# Patient Record
Sex: Male | Born: 1945
Health system: Southern US, Community
[De-identification: ages and names within clinical notes are randomized; demographics above are authoritative.]

## PROBLEM LIST (undated history)

## (undated) DIAGNOSIS — G629 Polyneuropathy, unspecified: Secondary | ICD-10-CM

## (undated) DIAGNOSIS — M549 Dorsalgia, unspecified: Secondary | ICD-10-CM

## (undated) DIAGNOSIS — N189 Chronic kidney disease, unspecified: Secondary | ICD-10-CM

## (undated) DIAGNOSIS — J45909 Unspecified asthma, uncomplicated: Secondary | ICD-10-CM

## (undated) DIAGNOSIS — R42 Dizziness and giddiness: Secondary | ICD-10-CM

## (undated) DIAGNOSIS — D510 Vitamin B12 deficiency anemia due to intrinsic factor deficiency: Secondary | ICD-10-CM

## (undated) DIAGNOSIS — I5030 Unspecified diastolic (congestive) heart failure: Secondary | ICD-10-CM

## (undated) DIAGNOSIS — E785 Hyperlipidemia, unspecified: Secondary | ICD-10-CM

## (undated) DIAGNOSIS — E119 Type 2 diabetes mellitus without complications: Secondary | ICD-10-CM

## (undated) DIAGNOSIS — G4730 Sleep apnea, unspecified: Secondary | ICD-10-CM

## (undated) DIAGNOSIS — F419 Anxiety disorder, unspecified: Secondary | ICD-10-CM

## (undated) DIAGNOSIS — K219 Gastro-esophageal reflux disease without esophagitis: Secondary | ICD-10-CM

## (undated) DIAGNOSIS — G510 Bell's palsy: Secondary | ICD-10-CM

## (undated) DIAGNOSIS — G4700 Insomnia, unspecified: Secondary | ICD-10-CM

## (undated) DIAGNOSIS — I2510 Atherosclerotic heart disease of native coronary artery without angina pectoris: Secondary | ICD-10-CM

## (undated) DIAGNOSIS — N183 Chronic kidney disease, stage 3 unspecified: Secondary | ICD-10-CM

## (undated) DIAGNOSIS — G8929 Other chronic pain: Secondary | ICD-10-CM

## (undated) DIAGNOSIS — F329 Major depressive disorder, single episode, unspecified: Secondary | ICD-10-CM

## (undated) DIAGNOSIS — Z87442 Personal history of urinary calculi: Secondary | ICD-10-CM

## (undated) DIAGNOSIS — I2699 Other pulmonary embolism without acute cor pulmonale: Secondary | ICD-10-CM

## (undated) DIAGNOSIS — I10 Essential (primary) hypertension: Secondary | ICD-10-CM

## (undated) DIAGNOSIS — I80299 Phlebitis and thrombophlebitis of other deep vessels of unspecified lower extremity: Secondary | ICD-10-CM

## (undated) DIAGNOSIS — M5137 Other intervertebral disc degeneration, lumbosacral region: Secondary | ICD-10-CM

## (undated) DIAGNOSIS — D509 Iron deficiency anemia, unspecified: Secondary | ICD-10-CM

## (undated) DIAGNOSIS — R0600 Dyspnea, unspecified: Secondary | ICD-10-CM

## (undated) DIAGNOSIS — M109 Gout, unspecified: Secondary | ICD-10-CM

## (undated) DIAGNOSIS — N289 Disorder of kidney and ureter, unspecified: Secondary | ICD-10-CM

## (undated) DIAGNOSIS — IMO0001 Reserved for inherently not codable concepts without codable children: Secondary | ICD-10-CM

## (undated) DIAGNOSIS — R011 Cardiac murmur, unspecified: Secondary | ICD-10-CM

## (undated) DIAGNOSIS — G609 Hereditary and idiopathic neuropathy, unspecified: Secondary | ICD-10-CM

## (undated) DIAGNOSIS — Z86010 Personal history of colonic polyps: Secondary | ICD-10-CM

## (undated) DIAGNOSIS — R609 Edema, unspecified: Secondary | ICD-10-CM

## (undated) HISTORY — DX: Other pulmonary embolism without acute cor pulmonale: I26.99

## (undated) HISTORY — DX: Hyperlipidemia, unspecified: E78.5

## (undated) HISTORY — DX: Vitamin B12 deficiency anemia due to intrinsic factor deficiency: D51.0

## (undated) HISTORY — DX: Major depressive disorder, single episode, unspecified: F32.9

## (undated) HISTORY — DX: Atherosclerotic heart disease of native coronary artery without angina pectoris: I25.10

## (undated) HISTORY — DX: Gastro-esophageal reflux disease without esophagitis: K21.9

## (undated) HISTORY — DX: Essential (primary) hypertension: I10

## (undated) HISTORY — DX: Hereditary and idiopathic neuropathy, unspecified: G60.9

## (undated) HISTORY — DX: Sleep apnea, unspecified: G47.30

## (undated) HISTORY — PX: LITHOTRIPSY: SUR834

## (undated) HISTORY — DX: Unspecified diastolic (congestive) heart failure: I50.30

## (undated) HISTORY — DX: Chronic kidney disease, unspecified: N18.9

## (undated) HISTORY — DX: Personal history of colonic polyps: Z86.010

## (undated) HISTORY — DX: Iron deficiency anemia, unspecified: D50.9

## (undated) HISTORY — DX: Chronic kidney disease, stage 3 unspecified: N18.30

## (undated) HISTORY — DX: Unspecified asthma, uncomplicated: J45.909

## (undated) HISTORY — DX: Type 2 diabetes mellitus without complications: E11.9

## (undated) HISTORY — DX: Other intervertebral disc degeneration, lumbosacral region: M51.37

## (undated) HISTORY — DX: Insomnia, unspecified: G47.00

## (undated) HISTORY — PX: TONSILLECTOMY: SUR1361

## (undated) HISTORY — PX: SHOULDER ARTHROSCOPY W/ ROTATOR CUFF REPAIR: SHX2400

## (undated) HISTORY — PX: BACK SURGERY: SHX140

## (undated) HISTORY — PX: CARDIAC CATHETERIZATION: SHX172

## (undated) HISTORY — DX: Phlebitis and thrombophlebitis of other deep vessels of unspecified lower extremity: I80.299

---
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
## 1998-06-26 ENCOUNTER — Encounter: Payer: Self-pay | Admitting: Pulmonary Disease

## 1998-11-26 ENCOUNTER — Ambulatory Visit (HOSPITAL_COMMUNITY): Admission: RE | Admit: 1998-11-26 | Discharge: 1998-11-26 | Payer: Self-pay | Admitting: Infectious Diseases

## 1999-05-12 ENCOUNTER — Encounter: Payer: Self-pay | Admitting: Endocrinology

## 1999-05-12 ENCOUNTER — Encounter: Admission: RE | Admit: 1999-05-12 | Discharge: 1999-05-12 | Payer: Self-pay | Admitting: Endocrinology

## 1999-05-17 ENCOUNTER — Encounter: Payer: Self-pay | Admitting: Endocrinology

## 1999-05-17 ENCOUNTER — Ambulatory Visit (HOSPITAL_COMMUNITY): Admission: RE | Admit: 1999-05-17 | Discharge: 1999-05-17 | Payer: Self-pay | Admitting: Endocrinology

## 1999-08-14 ENCOUNTER — Ambulatory Visit (HOSPITAL_COMMUNITY): Admission: RE | Admit: 1999-08-14 | Discharge: 1999-08-14 | Payer: Self-pay | Admitting: Gastroenterology

## 1999-11-02 ENCOUNTER — Encounter: Admission: RE | Admit: 1999-11-02 | Discharge: 1999-11-02 | Payer: Self-pay | Admitting: Neurology

## 1999-11-02 ENCOUNTER — Encounter: Payer: Self-pay | Admitting: Neurology

## 2000-02-13 ENCOUNTER — Encounter: Payer: Self-pay | Admitting: Orthopedic Surgery

## 2000-02-13 ENCOUNTER — Encounter: Admission: RE | Admit: 2000-02-13 | Discharge: 2000-02-13 | Payer: Self-pay | Admitting: Orthopedic Surgery

## 2000-08-28 ENCOUNTER — Encounter: Payer: Self-pay | Admitting: Orthopedic Surgery

## 2000-08-28 ENCOUNTER — Encounter: Admission: RE | Admit: 2000-08-28 | Discharge: 2000-08-28 | Payer: Self-pay | Admitting: Orthopedic Surgery

## 2002-01-29 ENCOUNTER — Ambulatory Visit (HOSPITAL_COMMUNITY): Admission: RE | Admit: 2002-01-29 | Discharge: 2002-01-29 | Payer: Self-pay | Admitting: Endocrinology

## 2002-01-29 ENCOUNTER — Encounter: Payer: Self-pay | Admitting: Endocrinology

## 2002-02-03 ENCOUNTER — Encounter: Admission: RE | Admit: 2002-02-03 | Discharge: 2002-02-03 | Payer: Self-pay | Admitting: Endocrinology

## 2002-02-03 ENCOUNTER — Encounter: Payer: Self-pay | Admitting: Endocrinology

## 2002-05-13 ENCOUNTER — Encounter: Payer: Self-pay | Admitting: Endocrinology

## 2002-05-13 ENCOUNTER — Ambulatory Visit (HOSPITAL_COMMUNITY): Admission: RE | Admit: 2002-05-13 | Discharge: 2002-05-13 | Payer: Self-pay | Admitting: Endocrinology

## 2002-10-15 HISTORY — PX: COLONOSCOPY: SHX174

## 2003-11-25 ENCOUNTER — Ambulatory Visit: Payer: Self-pay | Admitting: Endocrinology

## 2003-12-01 ENCOUNTER — Ambulatory Visit: Payer: Self-pay | Admitting: Endocrinology

## 2003-12-12 ENCOUNTER — Ambulatory Visit: Payer: Self-pay

## 2003-12-14 ENCOUNTER — Ambulatory Visit: Payer: Self-pay | Admitting: Internal Medicine

## 2004-01-12 ENCOUNTER — Ambulatory Visit: Payer: Self-pay | Admitting: Endocrinology

## 2004-01-15 LAB — HM COLONOSCOPY

## 2004-02-16 ENCOUNTER — Ambulatory Visit: Payer: Self-pay | Admitting: Internal Medicine

## 2004-02-20 ENCOUNTER — Ambulatory Visit: Payer: Self-pay | Admitting: Internal Medicine

## 2004-02-20 ENCOUNTER — Ambulatory Visit (HOSPITAL_COMMUNITY): Admission: RE | Admit: 2004-02-20 | Discharge: 2004-02-20 | Payer: Self-pay | Admitting: Internal Medicine

## 2004-03-08 ENCOUNTER — Ambulatory Visit: Payer: Self-pay | Admitting: Internal Medicine

## 2004-04-16 ENCOUNTER — Ambulatory Visit: Payer: Self-pay | Admitting: Pulmonary Disease

## 2004-05-14 ENCOUNTER — Ambulatory Visit: Payer: Self-pay | Admitting: Pulmonary Disease

## 2004-06-15 ENCOUNTER — Ambulatory Visit: Payer: Self-pay | Admitting: Pulmonary Disease

## 2004-07-31 ENCOUNTER — Ambulatory Visit: Payer: Self-pay | Admitting: Endocrinology

## 2004-08-03 ENCOUNTER — Ambulatory Visit: Payer: Self-pay | Admitting: Endocrinology

## 2004-08-03 ENCOUNTER — Ambulatory Visit: Payer: Self-pay | Admitting: Pulmonary Disease

## 2004-10-16 ENCOUNTER — Ambulatory Visit: Payer: Self-pay | Admitting: Endocrinology

## 2004-11-07 ENCOUNTER — Ambulatory Visit: Payer: Self-pay | Admitting: Endocrinology

## 2004-11-14 ENCOUNTER — Ambulatory Visit: Payer: Self-pay | Admitting: Endocrinology

## 2005-01-14 HISTORY — PX: COLONOSCOPY: SHX174

## 2005-02-26 ENCOUNTER — Encounter: Admission: RE | Admit: 2005-02-26 | Discharge: 2005-02-26 | Payer: Self-pay | Admitting: Orthopedic Surgery

## 2005-03-15 ENCOUNTER — Ambulatory Visit: Payer: Self-pay | Admitting: Endocrinology

## 2005-03-19 ENCOUNTER — Encounter: Admission: RE | Admit: 2005-03-19 | Discharge: 2005-03-19 | Payer: Self-pay | Admitting: Endocrinology

## 2005-03-21 ENCOUNTER — Ambulatory Visit: Payer: Self-pay | Admitting: Endocrinology

## 2005-03-29 ENCOUNTER — Ambulatory Visit (HOSPITAL_COMMUNITY): Admission: RE | Admit: 2005-03-29 | Discharge: 2005-03-30 | Payer: Self-pay | Admitting: Orthopedic Surgery

## 2005-04-26 ENCOUNTER — Encounter: Payer: Self-pay | Admitting: Orthopedic Surgery

## 2005-05-14 ENCOUNTER — Encounter: Payer: Self-pay | Admitting: Orthopedic Surgery

## 2005-06-28 ENCOUNTER — Ambulatory Visit: Payer: Self-pay | Admitting: Endocrinology

## 2005-08-10 ENCOUNTER — Emergency Department: Payer: Self-pay | Admitting: Emergency Medicine

## 2005-10-10 ENCOUNTER — Ambulatory Visit: Payer: Self-pay | Admitting: Endocrinology

## 2005-10-16 ENCOUNTER — Ambulatory Visit: Payer: Self-pay | Admitting: Endocrinology

## 2005-10-28 ENCOUNTER — Ambulatory Visit: Payer: Self-pay | Admitting: Endocrinology

## 2005-10-29 ENCOUNTER — Ambulatory Visit: Payer: Self-pay | Admitting: Cardiology

## 2006-01-08 ENCOUNTER — Ambulatory Visit: Payer: Self-pay | Admitting: Critical Care Medicine

## 2006-01-13 ENCOUNTER — Ambulatory Visit: Payer: Self-pay | Admitting: Internal Medicine

## 2006-01-22 ENCOUNTER — Ambulatory Visit: Payer: Self-pay | Admitting: Pulmonary Disease

## 2006-01-22 ENCOUNTER — Ambulatory Visit: Payer: Self-pay | Admitting: Cardiology

## 2006-02-05 ENCOUNTER — Ambulatory Visit: Payer: Self-pay | Admitting: Critical Care Medicine

## 2006-02-05 ENCOUNTER — Ambulatory Visit: Payer: Self-pay | Admitting: Endocrinology

## 2006-02-05 LAB — CONVERTED CEMR LAB: Streptococcus, Group A Screen (Direct): NEGATIVE

## 2006-02-17 ENCOUNTER — Ambulatory Visit: Payer: Self-pay | Admitting: Internal Medicine

## 2006-02-19 ENCOUNTER — Ambulatory Visit: Payer: Self-pay | Admitting: Internal Medicine

## 2006-03-05 ENCOUNTER — Ambulatory Visit: Payer: Self-pay | Admitting: Endocrinology

## 2006-03-10 ENCOUNTER — Ambulatory Visit: Payer: Self-pay | Admitting: Endocrinology

## 2006-04-12 ENCOUNTER — Ambulatory Visit: Payer: Self-pay | Admitting: Family Medicine

## 2006-06-17 ENCOUNTER — Ambulatory Visit: Payer: Self-pay | Admitting: Endocrinology

## 2006-06-17 LAB — CONVERTED CEMR LAB
BUN: 23 mg/dL (ref 6–23)
Basophils Relative: 0.4 % (ref 0.0–1.0)
CO2: 25 meq/L (ref 19–32)
Chloride: 106 meq/L (ref 96–112)
Creatinine, Ser: 1.8 mg/dL — ABNORMAL HIGH (ref 0.4–1.5)
Eosinophils Absolute: 0.1 10*3/uL (ref 0.0–0.6)
Eosinophils Relative: 1.3 % (ref 0.0–5.0)
GFR calc Af Amer: 50 mL/min
Glucose, Bld: 218 mg/dL — ABNORMAL HIGH (ref 70–99)
Hemoglobin: 12.9 g/dL — ABNORMAL LOW (ref 13.0–17.0)
Lymphocytes Relative: 17 % (ref 12.0–46.0)
Monocytes Absolute: 0.2 10*3/uL (ref 0.2–0.7)
Neutro Abs: 8.2 10*3/uL — ABNORMAL HIGH (ref 1.4–7.7)
Neutrophils Relative %: 79.4 % — ABNORMAL HIGH (ref 43.0–77.0)

## 2006-06-25 ENCOUNTER — Ambulatory Visit: Payer: Self-pay | Admitting: Endocrinology

## 2006-07-03 ENCOUNTER — Ambulatory Visit: Payer: Self-pay | Admitting: Endocrinology

## 2006-07-26 ENCOUNTER — Encounter: Payer: Self-pay | Admitting: Endocrinology

## 2006-07-26 DIAGNOSIS — D509 Iron deficiency anemia, unspecified: Secondary | ICD-10-CM

## 2006-07-26 DIAGNOSIS — Z860100 Personal history of colon polyps, unspecified: Secondary | ICD-10-CM

## 2006-07-26 DIAGNOSIS — K219 Gastro-esophageal reflux disease without esophagitis: Secondary | ICD-10-CM | POA: Insufficient documentation

## 2006-07-26 DIAGNOSIS — J45909 Unspecified asthma, uncomplicated: Secondary | ICD-10-CM

## 2006-07-26 DIAGNOSIS — E119 Type 2 diabetes mellitus without complications: Secondary | ICD-10-CM

## 2006-07-26 DIAGNOSIS — I10 Essential (primary) hypertension: Secondary | ICD-10-CM

## 2006-07-26 DIAGNOSIS — G609 Hereditary and idiopathic neuropathy, unspecified: Secondary | ICD-10-CM

## 2006-07-26 HISTORY — DX: Hereditary and idiopathic neuropathy, unspecified: G60.9

## 2006-07-26 HISTORY — DX: Iron deficiency anemia, unspecified: D50.9

## 2006-07-26 HISTORY — DX: Type 2 diabetes mellitus without complications: E11.9

## 2006-07-26 HISTORY — DX: Gastro-esophageal reflux disease without esophagitis: K21.9

## 2006-07-26 HISTORY — DX: Unspecified asthma, uncomplicated: J45.909

## 2006-07-26 HISTORY — DX: Personal history of colonic polyps: Z86.010

## 2006-07-26 HISTORY — DX: Personal history of colon polyps, unspecified: Z86.0100

## 2006-07-26 HISTORY — DX: Essential (primary) hypertension: I10

## 2006-08-05 ENCOUNTER — Ambulatory Visit: Payer: Self-pay | Admitting: Endocrinology

## 2006-08-07 ENCOUNTER — Encounter: Admission: RE | Admit: 2006-08-07 | Discharge: 2006-08-07 | Payer: Self-pay | Admitting: Endocrinology

## 2006-11-14 ENCOUNTER — Ambulatory Visit: Payer: Self-pay | Admitting: Endocrinology

## 2006-11-14 LAB — CONVERTED CEMR LAB
ALT: 27 units/L (ref 0–53)
AST: 19 units/L (ref 0–37)
BUN: 18 mg/dL (ref 6–23)
Bacteria, UA: NEGATIVE
Bilirubin Urine: NEGATIVE
Bilirubin, Direct: 0.2 mg/dL (ref 0.0–0.3)
CO2: 30 meq/L (ref 19–32)
Calcium: 9.2 mg/dL (ref 8.4–10.5)
Creatinine, Ser: 1.5 mg/dL (ref 0.4–1.5)
Creatinine,U: 209.6 mg/dL
Eosinophils Relative: 3.3 % (ref 0.0–5.0)
HCT: 36.8 % — ABNORMAL LOW (ref 39.0–52.0)
Hemoglobin: 12.6 g/dL — ABNORMAL LOW (ref 13.0–17.0)
Ketones, ur: NEGATIVE mg/dL
LDL Cholesterol: 44 mg/dL (ref 0–99)
Lymphocytes Relative: 20.7 % (ref 12.0–46.0)
MCV: 86.5 fL (ref 78.0–100.0)
Mucus, UA: NEGATIVE
Neutrophils Relative %: 69.2 % (ref 43.0–77.0)
Nitrite: NEGATIVE
PSA: 0.41 ng/mL (ref 0.10–4.00)
Platelets: 226 10*3/uL (ref 150–400)
Specific Gravity, Urine: 1.025 (ref 1.000–1.03)
Squamous Epithelial / LPF: NEGATIVE /lpf
Total Bilirubin: 0.9 mg/dL (ref 0.3–1.2)
Total CHOL/HDL Ratio: 4.4
Total Protein, Urine: NEGATIVE mg/dL
Triglycerides: 158 mg/dL — ABNORMAL HIGH (ref 0–149)
VLDL: 32 mg/dL (ref 0–40)
WBC: 7.4 10*3/uL (ref 4.5–10.5)

## 2006-11-20 ENCOUNTER — Ambulatory Visit: Payer: Self-pay | Admitting: Endocrinology

## 2006-11-20 DIAGNOSIS — D510 Vitamin B12 deficiency anemia due to intrinsic factor deficiency: Secondary | ICD-10-CM | POA: Insufficient documentation

## 2006-11-20 HISTORY — DX: Vitamin B12 deficiency anemia due to intrinsic factor deficiency: D51.0

## 2006-11-25 ENCOUNTER — Encounter: Payer: Self-pay | Admitting: Endocrinology

## 2007-02-13 ENCOUNTER — Encounter: Payer: Self-pay | Admitting: Endocrinology

## 2007-02-15 ENCOUNTER — Encounter: Admission: RE | Admit: 2007-02-15 | Discharge: 2007-02-15 | Payer: Self-pay | Admitting: Orthopedic Surgery

## 2007-03-19 ENCOUNTER — Ambulatory Visit: Payer: Self-pay | Admitting: Endocrinology

## 2007-03-25 ENCOUNTER — Ambulatory Visit: Payer: Self-pay | Admitting: Endocrinology

## 2007-08-14 ENCOUNTER — Telehealth (INDEPENDENT_AMBULATORY_CARE_PROVIDER_SITE_OTHER): Payer: Self-pay | Admitting: *Deleted

## 2007-08-21 ENCOUNTER — Ambulatory Visit: Payer: Self-pay | Admitting: Endocrinology

## 2007-08-21 DIAGNOSIS — F5104 Psychophysiologic insomnia: Secondary | ICD-10-CM | POA: Insufficient documentation

## 2007-08-21 DIAGNOSIS — M79609 Pain in unspecified limb: Secondary | ICD-10-CM | POA: Insufficient documentation

## 2007-08-21 DIAGNOSIS — G4700 Insomnia, unspecified: Secondary | ICD-10-CM

## 2007-08-21 HISTORY — DX: Insomnia, unspecified: G47.00

## 2007-08-21 LAB — CONVERTED CEMR LAB: Hgb A1c MFr Bld: 8.8 % — ABNORMAL HIGH (ref 4.6–6.0)

## 2007-08-26 ENCOUNTER — Telehealth (INDEPENDENT_AMBULATORY_CARE_PROVIDER_SITE_OTHER): Payer: Self-pay | Admitting: *Deleted

## 2007-08-26 ENCOUNTER — Encounter: Payer: Self-pay | Admitting: Endocrinology

## 2007-09-10 ENCOUNTER — Encounter: Payer: Self-pay | Admitting: Endocrinology

## 2007-09-14 ENCOUNTER — Encounter: Admission: RE | Admit: 2007-09-14 | Discharge: 2007-09-14 | Payer: Self-pay | Admitting: Neurosurgery

## 2007-09-23 ENCOUNTER — Ambulatory Visit: Payer: Self-pay | Admitting: Internal Medicine

## 2007-09-23 ENCOUNTER — Encounter: Admission: RE | Admit: 2007-09-23 | Discharge: 2007-09-23 | Payer: Self-pay | Admitting: Neurosurgery

## 2007-09-23 ENCOUNTER — Inpatient Hospital Stay (HOSPITAL_COMMUNITY): Admission: EM | Admit: 2007-09-23 | Discharge: 2007-09-25 | Payer: Self-pay | Admitting: Emergency Medicine

## 2007-09-25 ENCOUNTER — Encounter: Payer: Self-pay | Admitting: Internal Medicine

## 2007-09-29 ENCOUNTER — Ambulatory Visit: Payer: Self-pay | Admitting: Cardiology

## 2007-10-02 ENCOUNTER — Ambulatory Visit: Payer: Self-pay | Admitting: Cardiology

## 2007-10-05 ENCOUNTER — Ambulatory Visit: Payer: Self-pay | Admitting: Endocrinology

## 2007-10-05 DIAGNOSIS — G8929 Other chronic pain: Secondary | ICD-10-CM | POA: Insufficient documentation

## 2007-10-05 DIAGNOSIS — M51379 Other intervertebral disc degeneration, lumbosacral region without mention of lumbar back pain or lower extremity pain: Secondary | ICD-10-CM

## 2007-10-05 DIAGNOSIS — I2699 Other pulmonary embolism without acute cor pulmonale: Secondary | ICD-10-CM

## 2007-10-05 DIAGNOSIS — Z86711 Personal history of pulmonary embolism: Secondary | ICD-10-CM | POA: Insufficient documentation

## 2007-10-05 DIAGNOSIS — M5137 Other intervertebral disc degeneration, lumbosacral region: Secondary | ICD-10-CM | POA: Insufficient documentation

## 2007-10-05 HISTORY — DX: Other intervertebral disc degeneration, lumbosacral region without mention of lumbar back pain or lower extremity pain: M51.379

## 2007-10-05 HISTORY — DX: Other pulmonary embolism without acute cor pulmonale: I26.99

## 2007-10-05 HISTORY — DX: Other intervertebral disc degeneration, lumbosacral region: M51.37

## 2007-10-09 ENCOUNTER — Ambulatory Visit: Payer: Self-pay | Admitting: Cardiovascular Disease

## 2007-10-13 ENCOUNTER — Telehealth (INDEPENDENT_AMBULATORY_CARE_PROVIDER_SITE_OTHER): Payer: Self-pay | Admitting: *Deleted

## 2007-10-16 ENCOUNTER — Ambulatory Visit: Payer: Self-pay | Admitting: Cardiovascular Disease

## 2007-10-16 ENCOUNTER — Telehealth (INDEPENDENT_AMBULATORY_CARE_PROVIDER_SITE_OTHER): Payer: Self-pay | Admitting: *Deleted

## 2007-10-19 ENCOUNTER — Telehealth: Payer: Self-pay | Admitting: Endocrinology

## 2007-10-28 ENCOUNTER — Ambulatory Visit: Payer: Self-pay | Admitting: Endocrinology

## 2007-11-05 ENCOUNTER — Ambulatory Visit: Payer: Self-pay | Admitting: Cardiology

## 2007-11-06 ENCOUNTER — Telehealth (INDEPENDENT_AMBULATORY_CARE_PROVIDER_SITE_OTHER): Payer: Self-pay | Admitting: *Deleted

## 2007-11-26 ENCOUNTER — Ambulatory Visit: Payer: Self-pay | Admitting: Internal Medicine

## 2007-11-27 ENCOUNTER — Ambulatory Visit: Payer: Self-pay | Admitting: Endocrinology

## 2007-12-03 ENCOUNTER — Ambulatory Visit: Payer: Self-pay | Admitting: Emergency Medicine

## 2007-12-03 DIAGNOSIS — G4733 Obstructive sleep apnea (adult) (pediatric): Secondary | ICD-10-CM | POA: Insufficient documentation

## 2007-12-03 DIAGNOSIS — Z86718 Personal history of other venous thrombosis and embolism: Secondary | ICD-10-CM | POA: Insufficient documentation

## 2007-12-03 DIAGNOSIS — I80299 Phlebitis and thrombophlebitis of other deep vessels of unspecified lower extremity: Secondary | ICD-10-CM

## 2007-12-03 DIAGNOSIS — G4730 Sleep apnea, unspecified: Secondary | ICD-10-CM

## 2007-12-03 HISTORY — DX: Phlebitis and thrombophlebitis of other deep vessels of unspecified lower extremity: I80.299

## 2007-12-03 HISTORY — DX: Sleep apnea, unspecified: G47.30

## 2007-12-03 LAB — CONVERTED CEMR LAB
BUN: 20 mg/dL
CO2: 25 meq/L
Calcium: 9.2 mg/dL
Chloride: 101 meq/L
Creatinine, Ser: 1.4 mg/dL
GFR calc Af Amer: 67 mL/min
GFR calc non Af Amer: 55 mL/min
Glucose, Bld: 385 mg/dL — ABNORMAL HIGH
Potassium: 4.2 meq/L
Sodium: 139 meq/L

## 2007-12-04 ENCOUNTER — Encounter: Payer: Self-pay | Admitting: Pulmonary Disease

## 2007-12-04 ENCOUNTER — Ambulatory Visit: Payer: Self-pay

## 2007-12-07 ENCOUNTER — Ambulatory Visit: Payer: Self-pay | Admitting: Internal Medicine

## 2007-12-17 ENCOUNTER — Ambulatory Visit: Payer: Self-pay | Admitting: Pulmonary Disease

## 2007-12-22 ENCOUNTER — Ambulatory Visit: Payer: Self-pay | Admitting: Endocrinology

## 2007-12-24 ENCOUNTER — Ambulatory Visit: Payer: Self-pay | Admitting: Cardiovascular Disease

## 2008-01-21 ENCOUNTER — Ambulatory Visit: Payer: Self-pay | Admitting: Cardiology

## 2008-02-04 ENCOUNTER — Ambulatory Visit: Payer: Self-pay | Admitting: Cardiology

## 2008-02-18 ENCOUNTER — Ambulatory Visit: Payer: Self-pay

## 2008-02-22 ENCOUNTER — Ambulatory Visit: Payer: Self-pay | Admitting: Pulmonary Disease

## 2008-03-03 ENCOUNTER — Ambulatory Visit: Payer: Self-pay | Admitting: Cardiology

## 2008-03-18 ENCOUNTER — Ambulatory Visit: Payer: Self-pay | Admitting: Pulmonary Disease

## 2008-03-22 DIAGNOSIS — J069 Acute upper respiratory infection, unspecified: Secondary | ICD-10-CM

## 2008-03-22 DIAGNOSIS — J22 Unspecified acute lower respiratory infection: Secondary | ICD-10-CM | POA: Insufficient documentation

## 2008-03-29 ENCOUNTER — Ambulatory Visit: Payer: Self-pay | Admitting: Pulmonary Disease

## 2008-03-31 DIAGNOSIS — J209 Acute bronchitis, unspecified: Secondary | ICD-10-CM | POA: Insufficient documentation

## 2008-04-04 ENCOUNTER — Ambulatory Visit: Payer: Self-pay | Admitting: Cardiovascular Disease

## 2008-04-06 ENCOUNTER — Telehealth (INDEPENDENT_AMBULATORY_CARE_PROVIDER_SITE_OTHER): Payer: Self-pay | Admitting: *Deleted

## 2008-04-14 ENCOUNTER — Ambulatory Visit: Payer: Self-pay | Admitting: Cardiology

## 2008-04-21 ENCOUNTER — Ambulatory Visit: Payer: Self-pay | Admitting: Endocrinology

## 2008-05-11 ENCOUNTER — Ambulatory Visit: Payer: Self-pay

## 2008-05-11 ENCOUNTER — Encounter: Payer: Self-pay | Admitting: Pulmonary Disease

## 2008-05-13 ENCOUNTER — Ambulatory Visit: Payer: Self-pay | Admitting: Internal Medicine

## 2008-05-19 ENCOUNTER — Ambulatory Visit: Payer: Self-pay | Admitting: Pulmonary Disease

## 2008-05-25 ENCOUNTER — Encounter: Payer: Self-pay | Admitting: Endocrinology

## 2008-06-02 ENCOUNTER — Ambulatory Visit: Payer: Self-pay | Admitting: Endocrinology

## 2008-06-02 LAB — CONVERTED CEMR LAB: Hgb A1c MFr Bld: 9 % — ABNORMAL HIGH (ref 4.6–6.5)

## 2008-06-14 ENCOUNTER — Inpatient Hospital Stay (HOSPITAL_COMMUNITY): Admission: RE | Admit: 2008-06-14 | Discharge: 2008-06-17 | Payer: Self-pay | Admitting: Neurosurgery

## 2008-06-22 ENCOUNTER — Encounter: Payer: Self-pay | Admitting: Endocrinology

## 2008-06-30 ENCOUNTER — Ambulatory Visit: Payer: Self-pay | Admitting: Cardiology

## 2008-07-07 ENCOUNTER — Encounter: Admission: RE | Admit: 2008-07-07 | Discharge: 2008-07-07 | Payer: Self-pay | Admitting: Neurosurgery

## 2008-07-07 ENCOUNTER — Encounter: Payer: Self-pay | Admitting: Endocrinology

## 2008-07-27 ENCOUNTER — Ambulatory Visit: Payer: Self-pay | Admitting: Endocrinology

## 2008-07-28 ENCOUNTER — Ambulatory Visit: Payer: Self-pay | Admitting: Cardiology

## 2008-08-25 ENCOUNTER — Ambulatory Visit: Payer: Self-pay | Admitting: Cardiovascular Disease

## 2008-08-29 ENCOUNTER — Encounter: Payer: Self-pay | Admitting: *Deleted

## 2008-09-07 ENCOUNTER — Encounter: Payer: Self-pay | Admitting: Pulmonary Disease

## 2008-09-16 ENCOUNTER — Ambulatory Visit: Payer: Self-pay | Admitting: Endocrinology

## 2008-09-16 DIAGNOSIS — R809 Proteinuria, unspecified: Secondary | ICD-10-CM | POA: Insufficient documentation

## 2008-09-16 LAB — CONVERTED CEMR LAB: Microalb Creat Ratio: 48.8 mg/g — ABNORMAL HIGH (ref 0.0–30.0)

## 2008-09-22 ENCOUNTER — Ambulatory Visit: Payer: Self-pay | Admitting: Internal Medicine

## 2008-10-20 ENCOUNTER — Ambulatory Visit: Payer: Self-pay | Admitting: Cardiovascular Disease

## 2008-10-28 ENCOUNTER — Ambulatory Visit: Payer: Self-pay | Admitting: Endocrinology

## 2008-11-02 ENCOUNTER — Ambulatory Visit: Payer: Self-pay | Admitting: Pulmonary Disease

## 2008-11-02 LAB — CONVERTED CEMR LAB
BUN: 21 mg/dL (ref 6–23)
CO2: 30 meq/L (ref 19–32)
Chloride: 104 meq/L (ref 96–112)
Creatinine, Ser: 1.5 mg/dL (ref 0.4–1.5)

## 2008-11-03 ENCOUNTER — Ambulatory Visit: Payer: Self-pay | Admitting: Cardiovascular Disease

## 2008-11-03 ENCOUNTER — Ambulatory Visit: Payer: Self-pay

## 2008-11-03 ENCOUNTER — Encounter: Payer: Self-pay | Admitting: Pulmonary Disease

## 2008-12-01 ENCOUNTER — Encounter: Payer: Self-pay | Admitting: Internal Medicine

## 2008-12-13 ENCOUNTER — Ambulatory Visit: Payer: Self-pay | Admitting: Endocrinology

## 2008-12-16 ENCOUNTER — Telehealth (INDEPENDENT_AMBULATORY_CARE_PROVIDER_SITE_OTHER): Payer: Self-pay | Admitting: *Deleted

## 2008-12-22 ENCOUNTER — Ambulatory Visit: Payer: Self-pay | Admitting: Pulmonary Disease

## 2008-12-29 ENCOUNTER — Telehealth: Payer: Self-pay | Admitting: Internal Medicine

## 2009-01-05 ENCOUNTER — Encounter: Admission: RE | Admit: 2009-01-05 | Discharge: 2009-01-05 | Payer: Self-pay | Admitting: Neurosurgery

## 2009-01-16 ENCOUNTER — Ambulatory Visit: Payer: Self-pay | Admitting: Pulmonary Disease

## 2009-01-31 ENCOUNTER — Ambulatory Visit: Payer: Self-pay | Admitting: Pulmonary Disease

## 2009-02-06 ENCOUNTER — Ambulatory Visit: Payer: Self-pay | Admitting: Endocrinology

## 2009-02-06 DIAGNOSIS — L909 Atrophic disorder of skin, unspecified: Secondary | ICD-10-CM | POA: Insufficient documentation

## 2009-02-06 DIAGNOSIS — L919 Hypertrophic disorder of the skin, unspecified: Secondary | ICD-10-CM

## 2009-02-06 LAB — CONVERTED CEMR LAB
ALT: 25 units/L (ref 0–53)
AST: 17 units/L (ref 0–37)
Alkaline Phosphatase: 55 units/L (ref 39–117)
Basophils Relative: 0 % (ref 0.0–3.0)
Bilirubin Urine: NEGATIVE
Bilirubin, Direct: 0.1 mg/dL (ref 0.0–0.3)
Chloride: 103 meq/L (ref 96–112)
Creatinine, Ser: 1.5 mg/dL (ref 0.4–1.5)
Eosinophils Relative: 1.1 % (ref 0.0–5.0)
GFR calc non Af Amer: 50.55 mL/min (ref >60)
Iron: 44 ug/dL (ref 42–165)
Ketones, ur: NEGATIVE mg/dL
LDL Cholesterol: 65 mg/dL (ref 0–99)
Leukocytes, UA: NEGATIVE
MCV: 84.9 fL (ref 78.0–100.0)
Microalb Creat Ratio: 62 mg/g — ABNORMAL HIGH (ref 0.0–30.0)
Monocytes Absolute: 0.2 10*3/uL (ref 0.1–1.0)
Monocytes Relative: 1.9 % — ABNORMAL LOW (ref 3.0–12.0)
Neutrophils Relative %: 86.7 % — ABNORMAL HIGH (ref 43.0–77.0)
Nitrite: NEGATIVE
PSA: 0.64 ng/mL (ref 0.10–4.00)
Potassium: 4.5 meq/L (ref 3.5–5.1)
RBC: 4.78 M/uL (ref 4.22–5.81)
Saturation Ratios: 10.2 % — ABNORMAL LOW (ref 20.0–50.0)
Total Bilirubin: 0.8 mg/dL (ref 0.3–1.2)
Total CHOL/HDL Ratio: 4
Total Protein: 7.4 g/dL (ref 6.0–8.3)
Transferrin: 307.8 mg/dL (ref 212.0–360.0)
Triglycerides: 125 mg/dL (ref 0.0–149.0)
VLDL: 25 mg/dL (ref 0.0–40.0)
WBC: 12.4 10*3/uL — ABNORMAL HIGH (ref 4.5–10.5)

## 2009-02-23 ENCOUNTER — Telehealth (INDEPENDENT_AMBULATORY_CARE_PROVIDER_SITE_OTHER): Payer: Self-pay | Admitting: *Deleted

## 2009-02-27 ENCOUNTER — Telehealth: Payer: Self-pay | Admitting: Endocrinology

## 2009-03-10 ENCOUNTER — Telehealth: Payer: Self-pay | Admitting: Endocrinology

## 2009-03-14 ENCOUNTER — Ambulatory Visit: Payer: Self-pay | Admitting: Endocrinology

## 2009-03-14 DIAGNOSIS — F3289 Other specified depressive episodes: Secondary | ICD-10-CM

## 2009-03-14 DIAGNOSIS — Z8659 Personal history of other mental and behavioral disorders: Secondary | ICD-10-CM | POA: Insufficient documentation

## 2009-03-14 DIAGNOSIS — F329 Major depressive disorder, single episode, unspecified: Secondary | ICD-10-CM

## 2009-03-14 HISTORY — DX: Other specified depressive episodes: F32.89

## 2009-03-14 HISTORY — DX: Major depressive disorder, single episode, unspecified: F32.9

## 2009-04-13 ENCOUNTER — Ambulatory Visit: Payer: Self-pay | Admitting: Endocrinology

## 2009-04-13 DIAGNOSIS — J309 Allergic rhinitis, unspecified: Secondary | ICD-10-CM | POA: Insufficient documentation

## 2009-04-13 DIAGNOSIS — E1169 Type 2 diabetes mellitus with other specified complication: Secondary | ICD-10-CM | POA: Insufficient documentation

## 2009-04-13 DIAGNOSIS — E785 Hyperlipidemia, unspecified: Secondary | ICD-10-CM

## 2009-04-13 HISTORY — DX: Hyperlipidemia, unspecified: E78.5

## 2009-05-03 ENCOUNTER — Telehealth: Payer: Self-pay | Admitting: Pulmonary Disease

## 2009-05-19 ENCOUNTER — Telehealth: Payer: Self-pay | Admitting: Endocrinology

## 2009-05-24 ENCOUNTER — Encounter: Admission: RE | Admit: 2009-05-24 | Discharge: 2009-05-24 | Payer: Self-pay | Admitting: Neurosurgery

## 2009-05-25 ENCOUNTER — Ambulatory Visit: Payer: Self-pay | Admitting: Endocrinology

## 2009-05-25 LAB — CONVERTED CEMR LAB
Cholesterol: 102 mg/dL (ref 0–200)
HDL: 23.6 mg/dL — ABNORMAL LOW (ref >39.00)
Hgb A1c MFr Bld: 8.3 % — ABNORMAL HIGH (ref 4.6–6.5)
Total CHOL/HDL Ratio: 4
Triglycerides: 242 mg/dL — ABNORMAL HIGH (ref 0.0–149.0)

## 2009-08-02 ENCOUNTER — Ambulatory Visit: Payer: Self-pay | Admitting: Internal Medicine

## 2009-09-13 ENCOUNTER — Telehealth: Payer: Self-pay | Admitting: Endocrinology

## 2009-10-03 ENCOUNTER — Ambulatory Visit: Payer: Self-pay | Admitting: Endocrinology

## 2009-10-19 ENCOUNTER — Encounter: Payer: Self-pay | Admitting: Endocrinology

## 2009-11-27 ENCOUNTER — Telehealth: Payer: Self-pay | Admitting: Endocrinology

## 2009-12-13 ENCOUNTER — Telehealth (INDEPENDENT_AMBULATORY_CARE_PROVIDER_SITE_OTHER): Payer: Self-pay | Admitting: *Deleted

## 2010-01-23 ENCOUNTER — Ambulatory Visit
Admission: RE | Admit: 2010-01-23 | Discharge: 2010-01-23 | Payer: Self-pay | Source: Home / Self Care | Attending: Pulmonary Disease | Admitting: Pulmonary Disease

## 2010-02-01 ENCOUNTER — Ambulatory Visit: Admit: 2010-02-01 | Payer: Self-pay | Admitting: Endocrinology

## 2010-02-08 ENCOUNTER — Ambulatory Visit: Admit: 2010-02-08 | Payer: Self-pay | Admitting: Endocrinology

## 2010-02-13 NOTE — Assessment & Plan Note (Signed)
Summary: rov 2 wks ///kp   Visit Type:  Follow-up Primary Provider/Referring Provider:  ellison  CC:  Pt here for follow up. Pt c/o chest congestion, cough, wheezing, and and tightness in chest. Pt states is using CPAP every night approx 8 hours.  History of Present Illness: 63/M with  DVT/pulmonary embolism 9/09,  OSA & back surgery 6/10  by Dr Trenton Gammon. His only risk factor seems to have been that he was not very mobile and has been out of work since 6/09. There was no family history suggestive for hypercoagulable state.Off coumadin since 10/10 He has been seen earlier for a chronic cough, which was  attributed  to GERD.  3/16 >>dry cough, wheezing & dyspnea 05/18/08 >>  50 mg trazodone helping with sleep, 300 mg made him 'wired' , compliant with CPAP 11/01/08 The radiating pain in his LLE has stopped but some popliteal pain is present - neuropathic vs residual clot   December 22, 2008 --Presents for an acute office visit. saw Loanne Drilling 12-13-08 for cold symptoms, was given ceftin.  sore throat, body aches, left ear achiness, prod cough with clear/yellow mucus, increased SOB, wheezing, chills x2.5weeks. Sinus congestion not better, ears full. Ceftin did not help. Wheezing is bad today.  January 16, 2009 --Presents for an acute office visit. Complains of  wheezing, dry cough during the day, cough with brownish green mucus in the morning, and chest tightness  January 31, 2009 3:38 PM  c/o chest congestion, cough, wheezing, and and tightness in chest. States theses sxs have been going on since November and haven't gotten any better after finishing 2 rounds of abx and prednisone. LAst visit given zyrtec, dexilant, mucinex & solumedrol shot. Worse when going out in the cold or when drinking something cold.     Pt states is using CPAP every night approx 8 hours.  Denies chest pain,  orthopnea, hemoptysis, fever, n/v/d, edema, headache.   Current Medications (verified): 1)  Lasix 80 Mg  Tabs  (Furosemide) .... Take One Tab By Mouth Once Daily 2)  Humalog 100 Unit/ml Soln (Insulin Lispro (Human)) .... Three Times A Day (Qac) 150-100-150 Units 3)  Cyanocobalamin 1000 Mcg/ml Soln (Cyanocobalamin) .Marland Kitchen.. 1 Injection Every 3 Months 4)  Tricor 48 Mg  Tabs (Fenofibrate) .... Take One Tab By Mouth Once Daily 5)  Cozaar 100 Mg Tabs (Losartan Potassium) .... Take 1 By Mouth Once Daily 6)  Lovastatin 40 Mg Tabs (Lovastatin) .... Take 2 Tab By Mouth At Bedtime 7)  Viagra 100 Mg  Tabs (Sildenafil Citrate) .... Take 1 Tablet By Mouth Once A Day As Needed 8)  Hydrocodone-Acetaminophen 5-500 Mg  Tabs (Hydrocodone-Acetaminophen) .... Take 1 Tab By Mouth Every 4 Hours As Needed Pain 9)  Humulin N 100 Unit/ml Susp (Insulin Isophane Human) .... 240 Units Every Night 10)  Nasal Cpap + 15 Cm 11)  Metformin Hcl 500 Mg Xr24h-Tab (Metformin Hcl) .... 2 Bid 12)  Mobic 15 Mg Tabs (Meloxicam) .... As Needed 13)  Dilt-Xr 120 Mg Xr24h-Cap (Diltiazem Hcl) .Marland Kitchen.. 1 Qd 14)  Temazepam 30 Mg Caps (Temazepam) .Marland Kitchen.. 1 Qhs 15)  Hydromet 5-1.5 Mg/45ml Syrp (Hydrocodone-Homatropine) .Marland Kitchen.. 1-2 Tsp Every 4-6 Hr As Needed Cough 16)  Tessalon 200 Mg Caps (Benzonatate) .Marland Kitchen.. 1 By Mouth Three Times A Day  Allergies (verified): 1)  ! Codeine 2)  ! * Advicor 3)  ! * Actos  Past History:  Past Medical History: Last updated: 10/28/2007  ANEMIA-IRON DEFICIENCY (ICD-280.9)  DISC DISEASE, LUMBAR (ICD-722.52)  PULMONARY EMBOLISM (ICD-415.19) INSOMNIA (ICD-780.52) FOOT PAIN, LEFT (ICD-729.5) ROUTINE GENERAL MEDICAL EXAM@HEALTH  CARE FACL (ICD-V70.0) PERNICIOUS ANEMIA (ICD-281.0) PERIPHERAL NEUROPATHY (ICD-356.9) COLONIC POLYPS, HX OF (ICD-V12.72) ASTHMA (ICD-493.90) HYPERTENSION (ICD-401.9) GERD (ICD-530.81) DIABETES MELLITUS, TYPE II (ICD-250.00) ANEMIA-IRON DEFICIENCY (ICD-280.9)  Social History: Last updated: 11/02/2008 married children worked [employer] - disabled due to back pain  Past Pulmonary  History:  Pulmonary History: 6/00:  Polysomnogram (Obstructive Sleep Apnea) Obstructive sleep apnea was diagnosed 6/00. Polysomnogram performed at [facility], RDI 138/h, lowest destan 79% and he has been maintained on CPAP+ 15 with a nasal mask. ([facility], [place]) since.  9/09 >> duplex >>echogenic material is seen in   the left profunda femoral, femoral and popliteal veins. 02/18/08 >>residual thrombus in left prox to distal SFV 4/10 residual thrombus in Lt SFV persists  9/9 >> spiral CT >>Extensive bilateral pulmonary embolism is seen in both  right and left pulmonary arteries as well as their lobar branches. 12/07/07 CT angio shows incomplete recanalisation of pulmonary arteries  Review of Systems       The patient complains of dyspnea on exertion and prolonged cough.  The patient denies anorexia, fever, weight loss, weight gain, vision loss, decreased hearing, hoarseness, chest pain, syncope, peripheral edema, headaches, hemoptysis, abdominal pain, melena, hematochezia, severe indigestion/heartburn, hematuria, muscle weakness, suspicious skin lesions, difficulty walking, depression, unusual weight change, abnormal bleeding, and enlarged lymph nodes.    Vital Signs:  Patient profile:   65 year old male Height:      67.5 inches Weight:      281 pounds O2 Sat:      96 % on Room air Temp:     98.3 degrees F oral Pulse rate:   65 / minute BP sitting:   114 / 62  (left arm) Cuff size:   regular  Vitals Entered By: Iran Planas CMA (January 31, 2009 3:29 PM)  O2 Flow:  Room air CC: Pt here for follow up. Pt c/o chest congestion, cough, wheezing, and tightness in chest. Pt states is using CPAP every night approx 8 hours Comments Medications reviewed with patient Iran Planas CMA  January 31, 2009 3:31 PM    Physical Exam  Additional Exam:  Gen. Pleasant, well-nourished, in no distress, normal affect, obesity  ENT - no lesions, no post nasal drip,  class 2 airway Neck: No JVD, no thyromegaly, no carotid bruits Lungs:exp wheezing bilaterally. coarse BS  Cardiovascular: Rhythm regular, heart sounds  normal, no murmurs or gallops, no peripheral edema Musculoskeletal: No deformities, no cyanosis or clubbing, no calf tenderness       Impression & Recommendations:  Problem # 1:  BRONCHITIS, ACUTE (ICD-466.0)  Likely post bronchitic cough now - not relieved by narcotic cough syrup. Symbicotr 160/4.5 2 puff two times a day sample Albuterol nebs only as needed  Stay on hydrocodone cough syrup & tessalon STOP mucinex His updated medication list for this problem includes:    Hydromet 5-1.5 Mg/40ml Syrp (Hydrocodone-homatropine) .Marland Kitchen... 1-2 tsp every 4-6 hr as needed cough    Tessalon 200 Mg Caps (Benzonatate) .Marland Kitchen... 1 by mouth three times a day    Albuterol Sulfate (2.5 Mg/106ml) 0.083% Nebu (Albuterol sulfate) .Marland Kitchen..Marland Kitchen Two times a day as needed  Orders: Est. Patient Level III [id number])  Problem # 2:  GERD (ICD-530.81)  His updated medication list for this problem includes:    [facility] Omeprazole 20 Mg Tbec (Omeprazole) ..... Once daily  Problem # 3:  OBSTRUCTIVE SLEEP APNEA (ICD-780.57)  CPAP compliant  Orders: Est. Patient Level III [id number])  Problem # 4:  PULMONARY EMBOLISM (ICD-415.19)  Off coumadin since 10/10  Orders: Est. Patient Level III [id number])  Medications Added to Medication List This Visit: 1)  Lovastatin 40 Mg Tabs (Lovastatin) .... Take 2 tab by mouth at bedtime 2)  [facility] Omeprazole 20 Mg Tbec (Omeprazole) .... Once daily 3)  Albuterol Sulfate (2.5 Mg/62ml) 0.083% Nebu (Albuterol sulfate) .... Two times a day as needed 4)  Nebuliser Mouthpiece  5)  Nebulizer Mouth Piece  .... Neb mouth piece, tubing, ect  Other Orders: DME Referral (DME)  Patient Instructions: 1)  Copy sent to: 2)  Please schedule a follow-up appointment in 3 months. 3)  Protonix once daily  4)  Symbicotr 160/4.5 2 puff two times a day sample 5)   Albuterol nebs only as needed  6)  Stay on hydrocodone cough syrup & tessalon 7)  STOP mucinex  Prescriptions: NEBULIZER MOUTH PIECE neb mouth piece, tubing, ect  #1 x 0   Entered by:   Tilden Dome   Authorized by:   Leanna Sato. Elsworth Soho MD   Signed by:   Tilden Dome on 01/31/2009   Method used:   Print then Give to Patient   RxID:   [id number] ALBUTEROL SULFATE (2.5 MG/3ML) 0.083% NEBU (ALBUTEROL SULFATE) two times a day as needed  #42 x 1   Entered and Authorized by:   Leanna Sato. Elsworth Soho MD   Signed by:   Leanna Sato Elsworth Soho MD on 01/31/2009   Method used:   Electronically to        [facility] [phone]* (retail)       [place], [place]  [address]       Ph: [phone] or [phone]       Fax: [phone]   RxID:   [id number] [facility] OMEPRAZOLE 20 MG TBEC (OMEPRAZOLE) once daily  #60 x 1   Entered and Authorized by:   Leanna Sato. Elsworth Soho MD   Signed by:   Leanna Sato Elsworth Soho MD on 01/31/2009   Method used:   Electronically to        [facility] [phone]* (retail)       [address] [place], [place]  [address]       Ph: [phone] or [phone]       Fax: [phone]   RxID:   [id number]

## 2010-02-13 NOTE — Progress Notes (Signed)
Phone Note Refill Request Message from:  Fax from Pharmacy on February 27, 2009 3:44 PM  Refills Requested: Medication #1:  COZAAR 100 MG TABS take 1 by mouth once daily   Dosage confirmed as above?Dosage Confirmed  Medication #2:  DILT-XR 120 MG XR24H-CAP 1 qd   Dosage confirmed as above?Dosage Confirmed  Medication #3:  TEMAZEPAM 30 MG CAPS 1 qhs   Dosage confirmed as above?Dosage Confirmed  Medication #4:  METFORMIN HCL 500 MG XR24H-TAB 2 bid   Dosage confirmed as above?Dosage Confirmed Pt wants meds to go to [facility].  Initial call taken by: Gardenia Phlegm RMA,  February 27, 2009 3:46 PM    Prescriptions: LOVASTATIN 40 MG TABS (LOVASTATIN) Take 2 tab by mouth at bedtime  #180 x 3   Entered by:   Gardenia Phlegm RMA   Authorized by:   Donavan Foil MD   Signed by:   Gardenia Phlegm RMA on 02/27/2009   Method used:   Faxed to ...       [facility]* (mail-order)             ,          Ph: [phone]       Fax: [phone]   RxID:   [id number] HUMULIN N 100 UNIT/ML SUSP (INSULIN ISOPHANE HUMAN) 300 units every night  #38month suppl x 3   Entered by:   Gardenia Phlegm RMA   Authorized by:   Donavan Foil MD   Signed by:   Gardenia Phlegm RMA on 02/27/2009   Method used:   Faxed to ...       [facility]* (mail-order)             ,          Ph: [phone]       Fax: [phone]   [id number] HUMALOG 100 UNIT/ML SOLN (INSULIN LISPRO (HUMAN)) three times a day (qac) 150-100-150 units  #70month suppl x 3   Entered by:   Gardenia Phlegm RMA   Authorized by:   Donavan Foil MD   Signed by:   Gardenia Phlegm RMA on 02/27/2009   Method used:   Faxed to ...       [facility]* (mail-order)             ,          Ph: [phone]       Fax: [phone]   [id number] [facility] OMEPRAZOLE 20 MG TBEC (OMEPRAZOLE) once daily  #90 x 3   Entered by:   Gardenia Phlegm RMA   Authorized by:   Donavan Foil MD   Signed by:   Gardenia Phlegm RMA  on 02/27/2009   Method used:   Faxed to ...       [facility] (mail-order)             ,          Ph: [phone]       Fax: [phone]   RxID:   [id number] LASIX 80 MG  TABS (FUROSEMIDE) TAKE ONE TAB by mouth once daily  #90 x 3   Entered by:   Gardenia Phlegm RMA   Authorized by:   Donavan Foil MD   Signed by:   Gardenia Phlegm RMA on 02/27/2009   Method used:   Faxed to ...       [facility]* [facility]ation officer)             ,  Ph: [phone]       Fax: [phone]   RxID:   [id number] METFORMIN HCL 500 MG XR24H-TAB (METFORMIN HCL) 2 bid  #180 x 3   Entered by:   Gardenia Phlegm RMA   Authorized by:   Donavan Foil MD   Signed by:   Gardenia Phlegm RMA on 02/27/2009   Method used:   Faxed to ...       [facility]* (mail-order)             ,          Ph: [phone]       Fax: [phone]   RxID:   [id number] DILT-XR 120 MG XR24H-CAP (DILTIAZEM HCL) 1 qd  #90 x 3   Entered by:   Gardenia Phlegm RMA   Authorized by:   Donavan Foil MD   Signed by:   Gardenia Phlegm RMA on 02/27/2009   Method used:   Faxed to ...       [facility]* (mail-order)             ,          Ph: [phone]       Fax: [phone]   [id number] COZAAR 100 MG TABS (LOSARTAN POTASSIUM) take 1 by mouth once daily  #90 x 3   Entered by:   Gardenia Phlegm RMA   Authorized by:   Donavan Foil MD   Signed by:   Gardenia Phlegm RMA on 02/27/2009   Method used:   Faxed to ...       [facility] (mail-order)             ,          Ph: [phone]       Fax: [phone]   RxID:   [id number]

## 2010-02-13 NOTE — Assessment & Plan Note (Signed)
Summary: 6 WK F/U PER PT/CD   Vital Signs:  Patient profile:   65 year old male Height:      67.5 inches (171.45 cm) Weight:      285.38 pounds (129.72 kg) O2 Sat:      96 % on Room air Temp:     98.4 degrees F (36.89 degrees C) oral Pulse rate:   59 / minute BP sitting:   140 / 52  (right arm) Cuff size:   large  Vitals Entered By: Gardenia Phlegm RMA (May 25, 2009 9:15 AM)  O2 Flow:  Room air CC: 6 week follow up/ pt states he is no longer taking Temazepam/ CF Is Patient Diabetic? Yes   Primary Provider:  Nosson Wender  CC:  6 week follow up/ pt states he is no longer taking Temazepam/ CF.  History of Present Illness: pt states few weeks of intermittent pain at the left hand, and slight associated numbness. no cbg record, but states cbg's are still high(200) in am and hs.   Current Medications (verified): 1)  Lasix 80 Mg  Tabs (Furosemide) .... Take One Tab By Mouth Once Daily 2)  Humalog 100 Unit/ml Soln (Insulin Lispro (Human)) .... Three Times A Day (Just Before Each Meal)  150-120-160 Units 3)  Cyanocobalamin 1000 Mcg/ml Soln (Cyanocobalamin) .Marland Kitchen.. 1 Injection Every 3 Months 4)  Cozaar 100 Mg Tabs (Losartan Potassium) .... Take 1 By Mouth Once Daily 5)  Lovastatin 40 Mg Tabs (Lovastatin) .... Take 2 Tab By Mouth At Bedtime 6)  Viagra 100 Mg  Tabs (Sildenafil Citrate) .... Take 1 Tablet By Mouth Once A Day As Needed 7)  Hydrocodone-Acetaminophen 5-500 Mg  Tabs (Hydrocodone-Acetaminophen) .... Take 1 Tab By Mouth Every 4 Hours As Needed Pain 8)  Humulin N 100 Unit/ml Susp (Insulin Isophane Human) .... 300 Units Every Night 9)  Nasal Cpap + 15 Cm 10)  Metformin Hcl 500 Mg Xr24h-Tab (Metformin Hcl) .... 2 Bid 11)  Mobic 15 Mg Tabs (Meloxicam) .... As Needed 12)  Dilt-Xr 120 Mg Xr24h-Cap (Diltiazem Hcl) .Marland Kitchen.. 1 Qd 13)  Temazepam 30 Mg Caps (Temazepam) .Marland Kitchen.. 1 Qhs 14)  Tessalon 200 Mg Caps (Benzonatate) .Marland Kitchen.. 1 By Mouth Three Times A Day 15)  Nebuliser Mouthpiece 16)  Nebulizer  Mouth Piece .... Neb Mouth Piece, Tubing, Ect 17)  Citalopram Hydrobromide 20 Mg Tabs (Citalopram Hydrobromide) .Marland Kitchen.. 1 Once Daily  Allergies (verified): 1)  ! Codeine 2)  ! * Advicor 3)  ! * Actos  Past History:  Past Medical History: Last updated: 10/28/2007  ANEMIA-IRON DEFICIENCY (ICD-280.9)  DISC DISEASE, LUMBAR (ICD-722.52) PULMONARY EMBOLISM (ICD-415.19) INSOMNIA (ICD-780.52) FOOT PAIN, LEFT (ICD-729.5) ROUTINE GENERAL MEDICAL EXAM@HEALTH  CARE FACL (ICD-V70.0) PERNICIOUS ANEMIA (ICD-281.0) PERIPHERAL NEUROPATHY (ICD-356.9) COLONIC POLYPS, HX OF (ICD-V12.72) ASTHMA (ICD-493.90) HYPERTENSION (ICD-401.9) GERD (ICD-530.81) DIABETES MELLITUS, TYPE II (ICD-250.00) ANEMIA-IRON DEFICIENCY (ICD-280.9)  Review of Systems  The patient denies weight loss and weight gain.    Physical Exam  General:  morbidly obese.  no distress  Extremities:  left hand is normal, except strength is limited by pain. Additional Exam:  Hemoglobin A1C       [H]  8.3 %                       4.6-6.5 Cholesterol LDL          52.2 mg/dL   Impression & Recommendations:  Problem # 1:  DIABETES MELLITUS, TYPE II (ICD-250.00) needs increased rx  Problem # 2:  hand pain ? due to cts  Problem # 3:  DYSLIPIDEMIA (ICD-272.4) well-controlled  Medications Added to Medication List This Visit: 1)  Humalog 100 Unit/ml Soln (Insulin lispro (human)) .... Three times a day (just before each meal)  150-120-180 units  Other Orders: TLB-Lipid Panel (80061-LIPID) TLB-A1C / Hgb A1C (Glycohemoglobin) (83036-A1C) Est. Patient Level IV [id number])  Patient Instructions: 1)  tests are being ordered for you today.  a few days after the test(s), please call [phone] to hear your test results. 2)  pending the test results, please increase humalog to (just before each meal) 150-120-180.  if you choose to eat a snack at bedtime, take 20 units of humalog with it.  there is no medical reason to eat a bedtime snack. 3)   continue nph 300 units at night.   4)  Please schedule a follow-up appointment in 6 weeks. 5)  refer orthopedics (ok to call for yourself). 6)  (update: i left message on phone-tree:  rx as we discussed)

## 2010-02-13 NOTE — Progress Notes (Signed)
  Phone Note Other Incoming   Request: Send information Summary of Call: Request received from [facility] forwarded to [facility].

## 2010-02-13 NOTE — Assessment & Plan Note (Signed)
Summary: FU--STC   Vital Signs:  Patient profile:   65 year old male Height:      67.5 inches (171.45 cm) Weight:      282 pounds (128.18 kg) BMI:     43.67 O2 Sat:      95 % on Room air Temp:     98.3 degrees F (36.83 degrees C) oral Pulse rate:   61 / minute BP sitting:   132 / 64  (left arm) Cuff size:   large  Vitals Entered By: Rebeca Alert MA (October 03, 2009 1:55 PM)  O2 Flow:  Room air CC: F/U visit/B 12/pt is no longer taking Prednisone/aj Is Patient Diabetic? Yes   Primary Provider:  ellison  CC:  F/U visit/B 12/pt is no longer taking Prednisone/aj.  History of Present Illness: he brings a record of his cbg's which i have reviewed today.  it is in the mid-100's, with no trend throughout the day.  he had 1 episode of mild hypoglycemia in the afternoon, after a smaller-than-usual lunch.    Current Medications (verified): 1)  Lasix 80 Mg  Tabs (Furosemide) .... Take One Tab By Mouth Once Daily 2)  Humalog 100 Unit/ml Soln (Insulin Lispro (Human)) .... Three Times A Day (Just Before Each Meal)  150-120-180 Units 3)  Cyanocobalamin 1000 Mcg/ml Soln (Cyanocobalamin) .Marland Kitchen.. 1 Injection Every 3 Months 4)  Cozaar 100 Mg Tabs (Losartan Potassium) .... Take 1 By Mouth Once Daily 5)  Lovastatin 40 Mg Tabs (Lovastatin) .... Take 2 Tab By Mouth At Bedtime 6)  Viagra 100 Mg  Tabs (Sildenafil Citrate) .... Take 1 Tablet By Mouth Once A Day As Needed 7)  Hydrocodone-Acetaminophen 5-500 Mg  Tabs (Hydrocodone-Acetaminophen) .... Take 1 Tab By Mouth Every 4 Hours As Needed Pain 8)  Humulin N 100 Unit/ml Susp (Insulin Isophane Human) .... 300 Units Every Night 9)  Nasal Cpap + 15 Cm 10)  Metformin Hcl 500 Mg Xr24h-Tab (Metformin Hcl) .... 2 Bid 11)  Mobic 15 Mg Tabs (Meloxicam) .... As Needed 12)  Dilt-Xr 120 Mg Xr24h-Cap (Diltiazem Hcl) .Marland Kitchen.. 1 Qd 13)  Temazepam 30 Mg Caps (Temazepam) .Marland Kitchen.. 1 Qhs 14)  Citalopram Hydrobromide 20 Mg Tabs (Citalopram Hydrobromide) .Marland Kitchen.. 1 Once  Daily 15)  Tessalon Perles 100 Mg Caps (Benzonatate) .Marland Kitchen.. 1 Three Times A Day As Needed For Cough 16)  Omeprazole 20 Mg  Cpdr (Omeprazole) .... Take One 30-60 Min Before First and Last Meals of The Day 17)  Prednisone 10 Mg  Tabs (Prednisone) .... 4 Each Am X 2days, 2x2days, 1x2days and Stop 18)  Chlor-Trimeton 4 Mg Tabs (Chlorpheniramine Maleate) .... One At Time Bedtime and Every 6 Hours As Needed For Drainage  Allergies (verified): 1)  ! Codeine 2)  ! * Advicor 3)  ! * Actos  Past History:  Past Medical History: Last updated: 08/02/2009 ANEMIA-IRON DEFICIENCY (ICD-280.9)  DISC DISEASE, LUMBAR (ICD-722.52) PULMONARY EMBOLISM (ICD-415.19) INSOMNIA (ICD-780.52) FOOT PAIN, LEFT (ICD-729.5) ROUTINE GENERAL MEDICAL EXAM@HEALTH  CARE FACL (ICD-V70.0) PERNICIOUS ANEMIA (ICD-281.0) PERIPHERAL NEUROPATHY (ICD-356.9) COLONIC POLYPS, HX OF (ICD-V12.72) ASTHMA (ICD-493.90) HYPERTENSION (ICD-401.9) GERD (ICD-530.81) DIABETES MELLITUS, TYPE II (ICD-250.00) ANEMIA-IRON DEFICIENCY (ICD-280.9)  Review of Systems  The patient denies syncope.    Physical Exam  General:  morbidly obese.  no distress  Pulses:  dorsalis pedis intact bilat.  Extremities:  no deformity.  no ulcer on the feet.  feet are of normal color and temp.   1+ right pedal edema and 2+ left pedal edema.   Neurologic:  sensation is intact to touch on the feet.  Additional Exam:  Hemoglobin A1C       [H]  8.4 %            Impression & Recommendations:  Problem # 1:  DIABETES MELLITUS, TYPE II (ICD-250.00) needs increased rx  Medications Added to Medication List This Visit: 1)  Humalog 100 Unit/ml Soln (Insulin lispro (human)) .... Three times a day (just before each meal)  160-110-190 units 2)  Humulin N 100 Unit/ml Susp (Insulin isophane human) .... 320 units every night 3)  Zostavax 19400 Unt/0.43ml Solr (Zoster vaccine live) .Marland Kitchen.. 1 dose im  Other Orders: TLB-A1C / Hgb A1C (Glycohemoglobin) (83036-A1C) Admin  1st Vaccine [id number]) Flu Vaccine 72yrs + [id number]) Admin of Therapeutic Inj  intramuscular or subcutaneous [id number]) Vit B12 1000 mcg (J3420) Est. Patient Level III [id number])  Patient Instructions: 1)  tests are being ordered for you today.  a few days after the test(s), please call [phone] to hear your test results. 2)  pending the test results, please increase humalog to (just before each meal) 160-110-190.  if you choose to eat a snack at bedtime, take 20 units of humalog with it.  there is no medical reason to eat a bedtime snack. 3)  increase nph to 320 units at night.   4)  Please schedule a physical appointment in 4 months. 5)  (update: i left message on phone-tree:  rx as we discussed) Prescriptions: ZOSTAVAX 57846 UNT/0.65ML SOLR (ZOSTER VACCINE LIVE) 1 dose im  #1 x 0   Entered and Authorized by:   Donavan Foil MD   Signed by:   Donavan Foil MD on 10/03/2009   Method used:   Print then Give to Patient   [id number]               Flu Vaccine Consent Questions     Do you have a history of severe allergic reactions to this vaccine? no    Any prior history of allergic reactions to egg and/or gelatin? no    Do you have a sensitivity to the preservative Thimersol? no    Do you have a past history of Guillan-Barre Syndrome? no    Do you currently have an acute febrile illness? no    Have you ever had a severe reaction to latex? no    Vaccine information given and explained to patient? yes    Are you currently pregnant? no    Lot Number:[id number]   Exp Date:07/14/2010   Site Given  Left Deltoid IMlu   Medication Administration  Injection # 1:    Medication: Vit B12 1000 mcg    Diagnosis: PERNICIOUS ANEMIA (ICD-281.0)    Route: IM    Site: R deltoid    Exp Date: 07/211    Lot #: [id number]    Mfr: [place]    Patient tolerated injection without complications    Given by: Rebeca Alert MA (October 03, 2009 2:36 PM)  Orders Added: 1)  TLB-A1C  / Hgb A1C (Glycohemoglobin) [83036-A1C] 2)  Admin 1st Vaccine [90471] 3)  Flu Vaccine 23yrs + [id number] 4)  Admin of Therapeutic Inj  intramuscular or subcutaneous [96372] 5)  Vit B12 1000 mcg [J3420] 6)  Est. Patient Level III [id number]

## 2010-02-13 NOTE — Assessment & Plan Note (Signed)
Summary: 1 month f/u / #/ cd   Vital Signs:  Patient profile:   65 year old male Height:      67.5 inches (171.45 cm) Weight:      288.38 pounds (131.08 kg) O2 Sat:      95 % on Room air Temp:     97.3 degrees F (36.28 degrees C) oral Pulse rate:   66 / minute BP sitting:   132 / 52  (left arm) Cuff size:   large  Vitals Entered By: Gardenia Phlegm RMA (April 13, 2009 10:13 AM)  O2 Flow:  Room air CC: 1 month follow up/ B12/ CF Is Patient Diabetic? Yes   Primary Provider:  Glenroy Crossen  CC:  1 month follow up/ B12/ CF.  History of Present Illness: the status of at least 3 ongoing medical problems is addressed today: dm: no cbg record, but states cbg's are improved.  he had 1 episode of mild hypoglycemia (70, in the afternoon).  it is highest at hs (mid-100's). deprssion:  he says celexa is helping a lot.  he says the depression is being driven by his medical conditions. allregic rhinitis:  loratadine has helped rhinorrhea, but he has itching of the eac's.  Current Medications (verified): 1)  Lasix 80 Mg  Tabs (Furosemide) .... Take One Tab By Mouth Once Daily 2)  Humalog 100 Unit/ml Soln (Insulin Lispro (Human)) .... Three Times A Day (Just Before Each Meal)  150-130-150 Units 3)  Cyanocobalamin 1000 Mcg/ml Soln (Cyanocobalamin) .Marland Kitchen.. 1 Injection Every 3 Months 4)  Tricor 48 Mg  Tabs (Fenofibrate) .... Take One Tab By Mouth Once Daily 5)  Cozaar 100 Mg Tabs (Losartan Potassium) .... Take 1 By Mouth Once Daily 6)  Lovastatin 40 Mg Tabs (Lovastatin) .... Take 2 Tab By Mouth At Bedtime 7)  Viagra 100 Mg  Tabs (Sildenafil Citrate) .... Take 1 Tablet By Mouth Once A Day As Needed 8)  Hydrocodone-Acetaminophen 5-500 Mg  Tabs (Hydrocodone-Acetaminophen) .... Take 1 Tab By Mouth Every 4 Hours As Needed Pain 9)  Humulin N 100 Unit/ml Susp (Insulin Isophane Human) .... 300 Units Every Night 10)  Nasal Cpap + 15 Cm 11)  Metformin Hcl 500 Mg Xr24h-Tab (Metformin Hcl) .... 2 Bid 12)  Mobic 15  Mg Tabs (Meloxicam) .... As Needed 13)  Dilt-Xr 120 Mg Xr24h-Cap (Diltiazem Hcl) .Marland Kitchen.. 1 Qd 14)  Temazepam 30 Mg Caps (Temazepam) .Marland Kitchen.. 1 Qhs 15)  Hydromet 5-1.5 Mg/70ml Syrp (Hydrocodone-Homatropine) .Marland Kitchen.. 1-2 Tsp Every 4-6 Hr As Needed Cough 16)  Tessalon 200 Mg Caps (Benzonatate) .Marland Kitchen.. 1 By Mouth Three Times A Day 17)  Nebuliser Mouthpiece 18)  Nebulizer Mouth Piece .... Neb Mouth Piece, Tubing, Ect 19)  Citalopram Hydrobromide 20 Mg Tabs (Citalopram Hydrobromide) .Marland Kitchen.. 1 Once Daily  Allergies (verified): 1)  ! Codeine 2)  ! * Advicor 3)  ! * Actos  Past History:  Past Medical History: Last updated: 10/28/2007  ANEMIA-IRON DEFICIENCY (ICD-280.9)  DISC DISEASE, LUMBAR (ICD-722.52) PULMONARY EMBOLISM (ICD-415.19) INSOMNIA (ICD-780.52) FOOT PAIN, LEFT (ICD-729.5) ROUTINE GENERAL MEDICAL EXAM@HEALTH  CARE FACL (ICD-V70.0) PERNICIOUS ANEMIA (ICD-281.0) PERIPHERAL NEUROPATHY (ICD-356.9) COLONIC POLYPS, HX OF (ICD-V12.72) ASTHMA (ICD-493.90) HYPERTENSION (ICD-401.9) GERD (ICD-530.81) DIABETES MELLITUS, TYPE II (ICD-250.00) ANEMIA-IRON DEFICIENCY (ICD-280.9)  Review of Systems  The patient denies syncope.         has also has nasal congestion, but this is improved.  Physical Exam  General:  morbidly obese.  no distress  Head:  head: no deformity eyes: no periorbital swelling, no  proptosis external nose and ears are normal mouth: no lesion seen Ears:  TM's intact and clear with normal canals (except for eczematous changes), with grossly normal hearing.     Impression & Recommendations:  Problem # 1:  DIABETES MELLITUS, TYPE II (ICD-250.00) needs increased rx  Problem # 2:  DEPRESSION (ICD-311) Assessment: Improved  Problem # 3:  ALLERGIC RHINITIS (ICD-477.9) Assessment: New with eczema of the eac's  Problem # 4:  DYSLIPIDEMIA (ICD-272.4) may be improved with improved control of dm  Medications Added to Medication List This Visit: 1)  Humalog 100 Unit/ml Soln  (Insulin lispro (human)) .... Three times a day (just before each meal)  150-120-160 units  Other Orders: Vit B12 1000 mcg (J3420) Admin of Therapeutic Inj  intramuscular or subcutaneous [id number]) Est. Patient Level IV [id number])  Patient Instructions: 1)  change humalog to (just before each meal) 150-120-160.  if you choose to eat a snack at bedtime, take 20 units of humalog with it.  there is no medical reason to eat a bedtime snack. 2)  continue nph 300 units at night.   3)  continue citalopram 20 mg once daily. 4)  Please schedule a follow-up appointment in 6 weeks, fasting. 5)  try applying non-prescription hydrocortisone to the ear canals as needed for itching 6)  try going off the tricor (fenofibrate)   Medication Administration  Injection # 1:    Medication: Vit B12 1000 mcg    Diagnosis: PERNICIOUS ANEMIA (ICD-281.0)    Route: IM    Site: L deltoid    Exp Date: 11/12    Lot #: [id number]    Mfr: American Regent    Patient tolerated injection without complications    Given by: Gardenia Phlegm RMA (April 13, 2009 10:27 AM)  Orders Added: 1)  Vit B12 1000 mcg [J3420] 2)  Admin of Therapeutic Inj  intramuscular or subcutaneous [[id number]] 3)  Est. Patient Level IV [id number]

## 2010-02-13 NOTE — Miscellaneous (Signed)
Summary: Immunization Entry   Immunization History:  Zostavax History:    Zostavax # 1:  zostavax (10/18/2009)   Immunization History:  Zostavax History:    Zostavax # 1:  zostavax (10/18/2009)   Lot [id number] Exp Date: 09/01/2010 Injection site: Left Deltoid [place] Dose: 0.11ml  Given at [facility], [place]

## 2010-02-13 NOTE — Progress Notes (Signed)
Summary: cpap mask  Phone Note Call from Patient Call back at [facility] Phone [phone]   Caller: Patient Call For: alva Reason for Call: Talk to Nurse Summary of Call: need new mask for cpap machine.  [place] need order for this - fax# [phone].  Need asap - mask bad and didn't get sleep last evening. Initial call taken by: Zigmund Gottron,  May 03, 2009 1:52 PM  Follow-up for Phone Call        Pt states needs new order for res med medium nasal mask and headgear.  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  Please advise if okay and we can send order to [facility], thanks  Follow-up by: Tilden Dome,  May 03, 2009 2:17 PM  Additional Follow-up for Phone Call Additional follow up Details #1::        Done Order faxed to [facility] for new res med medium nasal mask and headgear per pt's request. Called and spoke with Donald [name] and she is aware that order has been faxed. Phillips Grout  May 03, 2009 2:34 PM  Additional Follow-up by: Leanna Sato. Elsworth Soho MD,  May 03, 2009 2:25 PM

## 2010-02-13 NOTE — Progress Notes (Signed)
Summary: med refill  Phone Note Refill Request Message from:  Fax from Pharmacy on February 27, 2009 3:52 PM  Refills Requested: Medication #1:  TEMAZEPAM 30 MG CAPS 1 qhs   Dosage confirmed as above?Dosage Confirmed Refill request? Please advise? Send to [facility]  Initial call taken by: Gardenia Phlegm RMA,  February 27, 2009 3:52 PM  Follow-up for Phone Call        emr says was rx'ed x 6 mos, 4 mos ago.  i am happy to refill when due. Follow-up by: Donavan Foil MD,  February 27, 2009 4:17 PM  Additional Follow-up for Phone Call Additional follow up Details #1::        Called pt and left a message for him to call office. Additional Follow-up by: Gardenia Phlegm RMA,  February 27, 2009 4:48 PM    Additional Follow-up for Phone Call Additional follow up Details #2::    pt informed per MD to continue filling his Temazapam locally until his refills run out in April. Pt agreed and then informed me that he has decided to discontinue prescriptions with mail order and will contact them directly to cancel orders. pt says he has refills of all his medications at his local pharmacy. Follow-up by: Crissie Sickles, [place],  February 28, 2009 10:34 AM

## 2010-02-13 NOTE — Assessment & Plan Note (Signed)
Summary: 3 MTH PHYSICAL--$50--STC   Vital Signs:  Patient profile:   65 year old male Height:      67.5 inches (171.45 cm) Weight:      278.38 pounds (126.54 kg) O2 Sat:      95 % on Room air Temp:     98.6 degrees F (37.00 degrees C) oral Pulse rate:   60 / minute BP sitting:   118 / 42  (left arm) Cuff size:   large  Vitals Entered By: Gardenia Phlegm CMA (February 06, 2009 4:10 PM)  O2 Flow:  Room air CC: Physical/ B12/ EKG was done in June at [facility] with Dr Annette Stable before back surgery/ CF Is Patient Diabetic? Yes   Primary Provider:  Kevin Space  CC:  Physical/ B12/ EKG was done in June at [facility] with Dr Annette Stable before back surgery/ CF.  History of Present Illness: here for regular wellness examination.  He's feeling pretty well in general, and does not drink or smoke. he feels the prednisone could affect today's labs.  Current Medications (verified): 1)  Lasix 80 Mg  Tabs (Furosemide) .... Take One Tab By Mouth Once Daily 2)  Humalog 100 Unit/ml Soln (Insulin Lispro (Human)) .... Three Times A Day (Qac) 150-100-150 Units 3)  Cyanocobalamin 1000 Mcg/ml Soln (Cyanocobalamin) .Marland Kitchen.. 1 Injection Every 3 Months 4)  Tricor 48 Mg  Tabs (Fenofibrate) .... Take One Tab By Mouth Once Daily 5)  Cozaar 100 Mg Tabs (Losartan Potassium) .... Take 1 By Mouth Once Daily 6)  Lovastatin 40 Mg Tabs (Lovastatin) .... Take 2 Tab By Mouth At Bedtime 7)  Viagra 100 Mg  Tabs (Sildenafil Citrate) .... Take 1 Tablet By Mouth Once A Day As Needed 8)  Hydrocodone-Acetaminophen 5-500 Mg  Tabs (Hydrocodone-Acetaminophen) .... Take 1 Tab By Mouth Every 4 Hours As Needed Pain 9)  Humulin N 100 Unit/ml Susp (Insulin Isophane Human) .... 240 Units Every Night 10)  Nasal Cpap + 15 Cm 11)  Metformin Hcl 500 Mg Xr24h-Tab (Metformin Hcl) .... 2 Bid 12)  Mobic 15 Mg Tabs (Meloxicam) .... As Needed 13)  Dilt-Xr 120 Mg Xr24h-Cap (Diltiazem Hcl) .Marland Kitchen.. 1 Qd 14)  Temazepam 30 Mg Caps (Temazepam) .Marland Kitchen.. 1 Qhs 15)  Hydromet 5-1.5  Mg/34ml Syrp (Hydrocodone-Homatropine) .Marland Kitchen.. 1-2 Tsp Every 4-6 Hr As Needed Cough 16)  Tessalon 200 Mg Caps (Benzonatate) .Marland Kitchen.. 1 By Mouth Three Times A Day 17)  [facility] Omeprazole 20 Mg Tbec (Omeprazole) .... Once Daily 18)  Albuterol Sulfate (2.5 Mg/36ml) 0.083% Nebu (Albuterol Sulfate) .... Two Times A Day As Needed 19)  Nebuliser Mouthpiece 20)  Nebulizer Mouth Piece .... Neb Mouth Piece, Tubing, Ect  Allergies (verified): 1)  ! Codeine 2)  ! * Advicor 3)  ! * Actos  Family History: Reviewed history from 11/20/2006 and no changes required. mother and older sister had breast cancer.  Social History: Reviewed history from 11/02/2008 and no changes required. married children worked [employer] - disabled due to back pain.  Review of Systems  The patient denies fever, weight loss, weight gain, vision loss, decreased hearing, chest pain, syncope, dyspnea on exertion, headaches, abdominal pain, melena, hematochezia, severe indigestion/heartburn, hematuria, suspicious skin lesions, and depression.         cough is much better  Physical Exam  General:  obese.  no distress  Head:  head: no deformity eyes: no periorbital swelling, no proptosis external nose and ears are normal mouth: no lesion seen Neck:  Supple without thyroid enlargement or tenderness. No  cervical lymphadenopathy, neck masses or tracheal deviation.  Heart:  Regular rate and rhythm without murmurs or gallops noted. Normal S1,S2.   Abdomen:  abdomen is soft, nontender.  no hepatosplenomegaly.   not distended.  no hernia  Rectal:  normal external and internal exam.  heme neg  Prostate:  Normal size prostate without masses or tenderness.  Msk:  muscle bulk and strength are grossly normal.  no obvious joint swelling.  gait is normal and steady  Neurologic:  sensation is intact to touch on the feet cn 2-12 grossly intact.   readily moves all 4's.     Skin:  normal texture and temp.  no rash.  not  diaphoretic  Cervical Nodes:  No significant adenopathy.  Psych:  Alert and cooperative; normal mood and affect; normal attention span and concentration.   Additional Exam:  SEPARATE EVALUATION FOLLOWS--EACH PROBLEM HERE IS NEW, NOT RESPONDING TO TREATMENT, OR POSES SIGNIFICANT RISK TO THE PATIENT'S HEALTH: HISTORY OF THE PRESENT ILLNESS: he brings a record of his cbg's which i have reviewed today.  it is lowest (80) in the afternoon, and highest in am (high-100's). PAST MEDICAL HISTORY reviewed and up to date today REVIEW OF SYSTEMS: denies hypoglycemia PHYSICAL EXAMINATION: no deformity.  no ulcer on the feet.  feet are of normal color and temp.  there is 2+ bilat leg edema dorsalis pedis intact bilat.  no carotid bruit clear to auscultation.  no respiratory distress LAB/XRAY RESULTS: Hemoglobin A1C       [H]  9.6  IMPRESSION: dm, needs increased rx PLAN: see instruction page    Impression & Recommendations:  Problem # 1:  ROUTINE GENERAL MEDICAL EXAM@HEALTH  CARE FACL (ICD-V70.0)  Medications Added to Medication List This Visit: 1)  Humulin N 100 Unit/ml Susp (Insulin isophane human) .... 300 units every night  Other Orders: Vit B12 1000 mcg (J3420) Admin of Therapeutic Inj  intramuscular or subcutaneous [id number]) Est. Patient Level III [id number]) Est. Patient 40-64 years [id number]) Removal of Skin Tags up to 15 Lesions (11200)  Patient Instructions: 1)  i removed 8 skin tags for the neck, with 2% xylocaine/epi. 2)  (update: i left message on phone-tree:  increase nph to 300 units at night.  ret 3 mos) Prescriptions: HUMULIN N 100 UNIT/ML SUSP (INSULIN ISOPHANE HUMAN) 300 units every night  #10 vials x 11   Entered and Authorized by:   Donavan Foil MD   Signed by:   Donavan Foil MD on 02/08/2009   Method used:   Electronically to        [facility]. [phone]* (retail)       [place], [place]  [address]       Ph: [phone] or  [phone]       Fax: [phone]   RxID:   [id number]    Medication Administration  Injection # 1:    Medication: Vit B12 1000 mcg    Diagnosis: PERNICIOUS ANEMIA (ICD-281.0)    Route: IM    Site: R deltoid    Exp Date: 9/12    Lot #: [id number]    Mfr: American Regent    Patient tolerated injection without complications    Given by: Gardenia Phlegm CMA (February 06, 2009 4:12 PM)  Orders Added: 1)  Vit B12 1000 mcg [J3420] 2)  Admin of Therapeutic Inj  intramuscular or subcutaneous [96372] 3)  Est.  Patient Level III [id number] 4)  Est. Patient 40-64 years [99396] 5)  Removal of Skin Tags up to 15 Lesions [11200]

## 2010-02-13 NOTE — Progress Notes (Signed)
Summary: rx refill req  Phone Note Refill Request Message from:  Fax from Pharmacy on November 27, 2009 11:22 AM  Refills Requested: Medication #1:  METFORMIN HCL 500 MG XR24H-TAB 2 bid   Dosage confirmed as above?Dosage Confirmed  Medication #2:  COZAAR 100 MG TABS take 1 by mouth once daily   Dosage confirmed as above?Dosage Confirmed  Medication #3:  DILT-XR 120 MG XR24H-CAP 1 qd 90 day supply [facility]   Method Requested: Fax to [facility] Next Appointment Scheduled: 02/08/2010    Prescriptions: DILT-XR 120 MG XR24H-CAP (DILTIAZEM HCL) 1 qd  #90 x 1   Entered by:   Rebeca Alert CMA ([place])   Authorized by:   Donavan Foil MD   Signed by:   Rebeca Alert CMA ([place]) on 11/27/2009   Method used:   Faxed to ...       [facility] (mail-order)             , [place]         Ph: [phone]       Fax: [phone]   RxID:   [id number] METFORMIN HCL 500 MG XR24H-TAB (METFORMIN HCL) 2 bid  #360 x 1   Entered by:   Rebeca Alert CMA ([place])   Authorized by:   Donavan Foil MD   Signed by:   Rebeca Alert CMA ([place]) on 11/27/2009   Method used:   Faxed to ...       [facility] (mail-order)             , [place]         Ph: [phone]       Fax: [phone]   RxID:   [id number] COZAAR 100 MG TABS (LOSARTAN POTASSIUM) take 1 by mouth once daily  #90 x 1   Entered by:   Rebeca Alert CMA ([place])   Authorized by:   Donavan Foil MD   Signed by:   Rebeca Alert CMA ([place]) on 11/27/2009   Method used:   Faxed to ...       [facility] (mail-order)             , [place]         Ph: [phone]       Fax: [phone]   RxID:   [id number]

## 2010-02-13 NOTE — Progress Notes (Signed)
Summary: BP?  Phone Note Call from Patient Call back at [facility] Phone [phone]   Caller: Patient Summary of Call: pt called reporting BP 160s/50s. pt states he feeling very sluggish and fatigued. pt is requesting a possible adjustment in BP meds, please advise. Initial call taken by: Crissie Sickles, [place],  March 10, 2009 10:50 AM  Follow-up for Phone Call        ov within the next week. Follow-up by: Donavan Foil MD,  March 10, 2009 11:47 AM  Additional Follow-up for Phone Call Additional follow up Details #1::        pt informed, transferred to sch appt Additional Follow-up by: Crissie Sickles, CMA,  March 10, 2009 11:59 AM

## 2010-02-13 NOTE — Assessment & Plan Note (Signed)
Summary: PER Donald [name] -FU W/MD--BP--STC   Vital Signs:  Patient profile:   65 year old male Height:      67.5 inches (171.45 cm) Weight:      285.13 pounds (129.60 kg) O2 Sat:      94 % on Room air Temp:     97.3 degrees F (36.28 degrees C) oral Pulse rate:   57 / minute BP sitting:   116 / 56  (left arm) Cuff size:   large  Vitals Entered By: Gardenia Phlegm RMA (March 14, 2009 10:17 AM)  O2 Flow:  Room air CC: Follow-up visit/ pt states he has no energy or strength [place] (pt states his wife thinks he's depressed)/ pt states that when he checks his BP at him the top number is 140-160 (Today 116/56)/ pt states voice is hoarse- but throat doesn't feel sore/ CF Is Patient Diabetic? Yes   Primary Provider:  Letecia Arps  CC:  Follow-up visit/ pt states he has no energy or strength X2weeks (pt states his wife thinks he's depressed)/ pt states that when he checks his BP at him the top number is 140-160 (Today 116/56)/ pt states voice is hoarse- but throat doesn't feel sore/ CF.  History of Present Illness: pt says bp at home is 150/-.  he says he tolerates bp meds well, as far as he knows. pt says his wife says he is depressed.  he has fatigue.  no cbg record, but states cbg's are 200's, and highest in the afternoon.  he often skips breakfast. pt states 1 day of nasal congestion.  little or no nasal congestion.  Current Medications (verified): 1)  Lasix 80 Mg  Tabs (Furosemide) .... Take One Tab By Mouth Once Daily 2)  Humalog 100 Unit/ml Soln (Insulin Lispro (Human)) .... Three Times A Day (Qac) 150-100-150 Units 3)  Cyanocobalamin 1000 Mcg/ml Soln (Cyanocobalamin) .Marland Kitchen.. 1 Injection Every 3 Months 4)  Tricor 48 Mg  Tabs (Fenofibrate) .... Take One Tab By Mouth Once Daily 5)  Cozaar 100 Mg Tabs (Losartan Potassium) .... Take 1 By Mouth Once Daily 6)  Lovastatin 40 Mg Tabs (Lovastatin) .... Take 2 Tab By Mouth At Bedtime 7)  Viagra 100 Mg  Tabs (Sildenafil Citrate) .... Take 1 Tablet By  Mouth Once A Day As Needed 8)  Hydrocodone-Acetaminophen 5-500 Mg  Tabs (Hydrocodone-Acetaminophen) .... Take 1 Tab By Mouth Every 4 Hours As Needed Pain 9)  Humulin N 100 Unit/ml Susp (Insulin Isophane Human) .... 300 Units Every Night 10)  Nasal Cpap + 15 Cm 11)  Metformin Hcl 500 Mg Xr24h-Tab (Metformin Hcl) .... 2 Bid 12)  Mobic 15 Mg Tabs (Meloxicam) .... As Needed 13)  Dilt-Xr 120 Mg Xr24h-Cap (Diltiazem Hcl) .Marland Kitchen.. 1 Qd 14)  Temazepam 30 Mg Caps (Temazepam) .Marland Kitchen.. 1 Qhs 15)  Hydromet 5-1.5 Mg/69ml Syrp (Hydrocodone-Homatropine) .Marland Kitchen.. 1-2 Tsp Every 4-6 Hr As Needed Cough 16)  Tessalon 200 Mg Caps (Benzonatate) .Marland Kitchen.. 1 By Mouth Three Times A Day 17)  Cvs Omeprazole 20 Mg Tbec (Omeprazole) .... Once Daily 18)  Nebuliser Mouthpiece 19)  Nebulizer Mouth Piece .... Neb Mouth Piece, Tubing, Ect  Allergies (verified): 1)  ! Codeine 2)  ! * Advicor 3)  ! * Actos  Past History:  Past Medical History: Last updated: 10/28/2007  ANEMIA-IRON DEFICIENCY (ICD-280.9)  DISC DISEASE, LUMBAR (ICD-722.52) PULMONARY EMBOLISM (ICD-415.19) INSOMNIA (ICD-780.52) FOOT PAIN, LEFT (ICD-729.5) ROUTINE GENERAL MEDICAL EXAM@HEALTH  CARE FACL (ICD-V70.0) PERNICIOUS ANEMIA (ICD-281.0) PERIPHERAL NEUROPATHY (ICD-356.9) COLONIC POLYPS, HX OF (ICD-V12.72)  ASTHMA (ICD-493.90) HYPERTENSION (ICD-401.9) GERD (ICD-530.81) DIABETES MELLITUS, TYPE II (ICD-250.00) ANEMIA-IRON DEFICIENCY (ICD-280.9)  Review of Systems  The patient denies hypoglycemia.         he denies anxiety, but he has insomnia  Physical Exam  General:  morbidly obese.  no distress  Psych:  Alert and cooperative; normal mood and affect; normal attention span and concentration.     Impression & Recommendations:  Problem # 1:  HYPERTENSION (ICD-401.9) with situational component  Problem # 2:  allergic rhinitis vs uri  Problem # 3:  DEPRESSION (ICD-311) Assessment: New  Problem # 4:  DIABETES MELLITUS, TYPE II (ICD-250.00) needs  increased rx  Medications Added to Medication List This Visit: 1)  Humalog 100 Unit/ml Soln (Insulin lispro (human)) .... Three times a day (just before each meal)  150-130-150 units 2)  Citalopram Hydrobromide 20 Mg Tabs (Citalopram hydrobromide) .Marland Kitchen.. 1 once daily  Other Orders: Est. Patient Level IV [id number])  Patient Instructions: 1)  increase humalog (just before each meal) 150-130-150.  if you choose to eat a snack at bedtime, take 20 units of humalog with it.  there is no medical reason to eat a bedtime snack. 2)  continue nph 300 units at night.   3)  citalopram 20 mg once daily. 4)  Please schedule a follow-up appointment in 1 month. 5)  try loratadine 10 mg once daily. Prescriptions: CITALOPRAM HYDROBROMIDE 20 MG TABS (CITALOPRAM HYDROBROMIDE) 1 once daily  #30 x 11   Entered and Authorized by:   Donavan Foil MD   Signed by:   Donavan Foil MD on 03/14/2009   Method used:   Electronically to        [facility]. [phone]* (retail)       [address] [place], [place]  [address]       Ph: [phone] or [phone]       Fax: [phone]   RxID:   [id number]

## 2010-02-13 NOTE — Progress Notes (Signed)
Summary: Records request from [place] for records received from [facility]. Request forwarded to [employer]. Dena Chavis  February 23, 2009 3:41 PM

## 2010-02-13 NOTE — Progress Notes (Signed)
Summary: OV due  Phone Note Outgoing Call Call back at [facility] Phone [phone]   Call placed by: Rebeca Alert MA,  September 13, 2009 4:34 PM Details for Reason: OV due Summary of Call: Per MD, pt is due for OV.  Follow-up for Phone Call        left message with pt's spouse to callback office to schedule appointment Follow-up by: Rebeca Alert MA,  September 13, 2009 4:36 PM

## 2010-02-13 NOTE — Progress Notes (Signed)
  Phone Note Refill Request Message from:  Fax from Pharmacy on May 19, 2009 9:09 AM  Refills Requested: Medication #1:  TEMAZEPAM 30 MG CAPS 1 qhs   Dosage confirmed as above?Dosage Confirmed Please advise refill  Initial call taken by: Gardenia Phlegm RMA,  May 19, 2009 9:09 AM  Follow-up for Phone Call        i printed Follow-up by: Donavan Foil MD,  May 19, 2009 9:40 AM  Additional Follow-up for Phone Call Additional follow up Details #1::        Rx faxed to pharmacy Additional Follow-up by: Crissie Sickles, [place],  May 19, 2009 9:50 AM    Prescriptions: TEMAZEPAM 30 MG CAPS (TEMAZEPAM) 1 qhs  #30 x 5   Entered and Authorized by:   Donavan Foil MD   Signed by:   Donavan Foil MD on 05/19/2009   Method used:   Print then Give to Patient   RxID:   [id number]

## 2010-02-13 NOTE — Assessment & Plan Note (Signed)
Summary: Pulmonary/ ext f/u ov for uacs   Primary Provider/Referring Provider:  ellison  CC:  Acute visit.  Pt c/o sore throat and cough x 5 days- cough prod with clear to yellow sputum.  He also c/o rhinitis and sinus pressure/HA.Marland Kitchen  History of Present Illness: 63yowm never smoker with  DVT/pulmonary embolism 9/09,  OSA & back surgery 6/10  by Dr Trenton Gammon. His only risk factor seems to have been that he was not very mobile and has been out of work since 6/09. There was no family history suggestive for hypercoagulable state.Off coumadin since 10/10 He has been seen earlier for a chronic cough, which was  attributed  to GERD.  3/16 >>dry cough, wheezing & dyspnea 05/18/08 >>  50 mg trazodone helping with sleep, 300 mg made him 'wired' , compliant with CPAP 11/01/08 The radiating pain in his LLE has stopped but some popliteal pain is present - neuropathic vs residual clot   December 22, 2008 --Presents for an acute office visit. saw Loanne Drilling 12-13-08 for cold symptoms, was given ceftin.  sore throat, body aches, left ear achiness, prod cough with clear/yellow mucus, increased SOB, wheezing, chills x2.5weeks. Sinus congestion not better, ears full. Ceftin did not help. Wheezing is bad today.  January 16, 2009 --Presents for an acute office visit. Complains of  wheezing, dry cough during the day, cough with brownish green mucus in the morning, and chest tightness  rx Protonix once daily  Symbicotr 160/4.5 2 puff two times a day sample Albuterol nebs only as needed  Stay on hydrocodone cough syrup & tessalon STOP mucinex  > all better and stopped medication did not refill  August 02, 2009 Acute visit.  Pt c/o sore throat and cough x 5 days- cough prod with clear to yellow sputum.  He also c/o rhinitis and sinus pressure/HA. tessilon worked the best.  Pt denies any significant sore throat, dysphagia, itching, sneezing,  nasal congestion or excess secretions,  fever, chills, sweats, unintended wt loss,  pleuritic or exertional cp, hempoptysis, change in activity tolerance  orthopnea pnd or leg swelling   Current Medications (verified): 1)  Lasix 80 Mg  Tabs (Furosemide) .... Take One Tab By Mouth Once Daily 2)  Humalog 100 Unit/ml Soln (Insulin Lispro (Human)) .... Three Times A Day (Just Before Each Meal)  150-120-180 Units 3)  Cyanocobalamin 1000 Mcg/ml Soln (Cyanocobalamin) .Marland Kitchen.. 1 Injection Every 3 Months 4)  Cozaar 100 Mg Tabs (Losartan Potassium) .... Take 1 By Mouth Once Daily 5)  Lovastatin 40 Mg Tabs (Lovastatin) .... Take 2 Tab By Mouth At Bedtime 6)  Viagra 100 Mg  Tabs (Sildenafil Citrate) .... Take 1 Tablet By Mouth Once A Day As Needed 7)  Hydrocodone-Acetaminophen 5-500 Mg  Tabs (Hydrocodone-Acetaminophen) .... Take 1 Tab By Mouth Every 4 Hours As Needed Pain 8)  Humulin N 100 Unit/ml Susp (Insulin Isophane Human) .... 300 Units Every Night 9)  Nasal Cpap + 15 Cm 10)  Metformin Hcl 500 Mg Xr24h-Tab (Metformin Hcl) .... 2 Bid 11)  Mobic 15 Mg Tabs (Meloxicam) .... As Needed 12)  Dilt-Xr 120 Mg Xr24h-Cap (Diltiazem Hcl) .Marland Kitchen.. 1 Qd 13)  Temazepam 30 Mg Caps (Temazepam) .Marland Kitchen.. 1 Qhs 14)  Citalopram Hydrobromide 20 Mg Tabs (Citalopram Hydrobromide) .Marland Kitchen.. 1 Once Daily 15)  Tessalon Perles 100 Mg Caps (Benzonatate) .Marland Kitchen.. 1 Three Times A Day As Needed For Cough  Allergies (verified): 1)  ! Codeine 2)  ! * Advicor 3)  ! * Actos  Past  History:  Past Medical History: ANEMIA-IRON DEFICIENCY (ICD-280.9)  DISC DISEASE, LUMBAR (ICD-722.52) PULMONARY EMBOLISM (ICD-415.19) INSOMNIA (ICD-780.52) FOOT PAIN, LEFT (ICD-729.5) ROUTINE GENERAL MEDICAL EXAM@HEALTH  CARE FACL (ICD-V70.0) PERNICIOUS ANEMIA (ICD-281.0) PERIPHERAL NEUROPATHY (ICD-356.9) COLONIC POLYPS, HX OF (ICD-V12.72) ASTHMA (ICD-493.90) HYPERTENSION (ICD-401.9) GERD (ICD-530.81) DIABETES MELLITUS, TYPE II (ICD-250.00) ANEMIA-IRON DEFICIENCY (ICD-280.9)  Past Pulmonary History:  Pulmonary History: 6/00:  Polysomnogram  (Obstructive Sleep Apnea) Obstructive sleep apnea was diagnosed 6/00. Polysomnogram performed at [facility], RDI 138/h, lowest destan 79% and he has been maintained on CPAP+ 15 with a nasal mask. ([facility], [place]) since.  9/09 >> duplex >>echogenic material is seen in   the left profunda femoral, femoral and popliteal veins. 02/18/08 >>residual thrombus in left prox to distal SFV 4/10 residual thrombus in Lt SFV persists  9/9 >> spiral CT >>Extensive bilateral pulmonary embolism is seen in both  right and left pulmonary arteries as well as their lobar branches. 12/07/07 CT angio shows incomplete recanalisation of pulmonary arteries  Vital Signs:  Patient profile:   65 year old male Weight:      292 pounds O2 Sat:      94 % on Room air Temp:     98.2 degrees F oral Pulse rate:   62 / minute BP sitting:   116 / 60  (left arm)  Vitals Entered By: Tilden Dome (August 02, 2009 2:34 PM)  O2 Flow:  Room air  Physical Exam  Additional Exam:  Gen. Pleasant, well-nourished, in no distress, normal affect, obesity  ENT - no lesions, no post nasal drip, class 2 airway.  mild/ mod turbinate edema with mucoid secretions Neck: No JVD, no thyromegaly, no carotid bruits Lungs:exp wheezing bilaterally. coarse BS  Cardiovascular: Rhythm regular, heart sounds  normal, no murmurs or gallops, no peripheral edema Musculoskeletal: No deformities, no cyanosis or clubbing, no calf tenderness       Impression & Recommendations:  Problem # 1:  BRONCHITIS, ACUTE (ICD-466.0)  The most common causes of chronic cough in immunocompetent adults include: upper airway cough syndrome (UACS), previously referred to as postnasal drip syndrome,  caused by variety of rhinosinus conditions; (2) asthma; (3) GERD; (4) chronic bronchitis from cigarette smoking or other inhaled environmental irritants; (5) nonasthmatic eosinophilic bronchitis; and (6) bronchiectasis. These conditions, singly or in  combination, have accounted for up to 94% of the causes of chronic cough in prospective studies.    This fits best with  Classic Upper airway cough syndrome, so named because it's frequently impossible to sort out how much is  CR/sinusitis with freq throat clearing (which can be related to primary GERD)   vs  causing  secondary extra esophageal GERD from wide swings in gastric pressure that occur with throat clearing, promoting self use of mint and menthol lozenges that reduce the lower esophageal sphincter tone and exacerbate the problem further These are the same pts who not infrequently have failed to tolerate ace inhibitors,  dry powder inhalers or biphosphonates or report having reflux symptoms that don't respond to standard doses of PPI  See instructions for specific recommendations  - treat this first and see if relapses and if so consider methacholine challenge / sinus ct  Orders: Est. Patient Level IV [id number])  Medications Added to Medication List This Visit: 1)  Tessalon Perles 100 Mg Caps (Benzonatate) .Marland Kitchen.. 1 three times a day as needed for cough 2)  Omeprazole 20 Mg Cpdr (Omeprazole) .... Take one 30-60 min before first and last meals of the day  3)  Prednisone 10 Mg Tabs (Prednisone) .... 4 each am x 2days, 2x2days, 1x2days and stop 4)  Chlor-trimeton 4 Mg Tabs (Chlorpheniramine maleate) .... One at time bedtime and every 6 hours as needed for drainage  Patient Instructions: 1)  Acid reflux is a  leading suspect here and needs to be eliminated  completely before considering additional studies or treatment options. To suppress this maximally, take  omeprazole 30 min  before first and last meal  until no longer coughing at all 2)  for drainage use chlortrimeton 4 mg every 6 hours as needed 3)  Prednisone 4 each am x 2days, 2x2days, 1x2days and stop and follow with Elsworth Soho in 2 weeks if not 100% better Prescriptions: PREDNISONE 10 MG  TABS (PREDNISONE) 4 each am x 2days, 2x2days, 1x2days and  stop  #14 x 0   Entered and Authorized by:   Tanda Rockers MD   Signed by:   Tanda Rockers MD on 08/02/2009   Method used:   Electronically to        [facility]. [phone]* (retail)       [address] [place], [place]  [address]       Ph: [phone] or [phone]       Fax: [phone]   RxID:   [id number]

## 2010-02-13 NOTE — Progress Notes (Signed)
Summary: med refill  Phone Note Refill Request Message from:  Fax from Pharmacy on November 27, 2009 11:29 AM  Refills Requested: Medication #1:  TEMAZEPAM 30 MG CAPS 1 qhs   Dosage confirmed as above?Dosage Confirmed pt request 90 day supply to [facility] pharmacy-please advise   Method Requested: Fax to [facility] Next Appointment Scheduled: 02/08/2010 Initial call taken by: Rebeca Alert CMA Deborra Medina),  November 27, 2009 11:29 AM  Follow-up for Phone Call        i printed Follow-up by: Donavan Foil MD,  November 27, 2009 11:35 AM  Additional Follow-up for Phone Call Additional follow up Details #1::        rx faxed to [facility] Additional Follow-up by: Rebeca Alert CMA Deborra Medina),  November 27, 2009 11:39 AM    Prescriptions: TEMAZEPAM 30 MG CAPS (TEMAZEPAM) 1 qhs  #90 x 1   Entered and Authorized by:   Donavan Foil MD   Signed by:   Donavan Foil MD on 11/27/2009   Method used:   Print then Give to Patient   RxID:   [id number]

## 2010-02-13 NOTE — Assessment & Plan Note (Signed)
Summary: WHEEZING/ COUGHING///kp   Primary Provider/Referring Provider:  ellison  CC:  Acute Visit.  c/o wheezing, dry cough during the day, cough with brownish green mucus in the morning, and and chest tightness when going out in the cold or when drinking something cold.  States theses sxs have been going on since November and haven't gotten any better after finishing 2 rounds of abx and prednisone.  Sxs seem to be worse early in the morning and late at night.  requesting rx for hydromet and albuterol rescue inhaler. .  History of Present Illness: 65/M with  DVT/pulmonary embolism 9/09,  OSA & back surgery 6/10  by Dr Trenton Gammon. He continues to have chronic low back pain. His only risk factor seems to have been that he was not very mobile and has been out of work since 6/09. There is no family history suggestive for hypercoagulable state. his INR is monitored by [facility]  He has been seen earlier for a chronic cough, which was  attributed  to GERD.   3/16 >>dry cough, wheezing & dyspnea 05/18/08 >> bronchitis resolved, reviewed duplex results with him.c/o back pain going down the side of his LLE. 50 mg trazodone helping with sleep, 300 mg made him 'wired' , compliant with CPAP  11/01/08 Some increase in dyspnea. Wonders if this is due to missing 1 dose of lasix. Was off coumadin for a few days in 6/10 for surgery . INR has been 2-3 range per pt. The radiating pain in his LLE has stopped but some popliteal pain is present - neuropathic vs residual clot   December 22, 2008 --Presents for an acute office visit. saw Loanne Drilling 12-13-08 for cold symptoms, was given ceftin.  sore throat, body aches, left ear achiness, prod cough with clear/yellow mucus, increased SOB, wheezing, chills x2.5weeks. Sinus congestion not better, ears full. Ceftin did not help. Wheezing is bad today. Denies chest pain,  orthopnea, hemoptysis, fever, n/v/d, edema, headache.   January 16, 2009 --Presents for an  acute office visit. Complains of  wheezing, dry cough during the day, cough with brownish green mucus in the morning, and chest tightness when going out in the cold or when drinking something cold.  States theses sxs have been going on since November and haven't gotten any better after finishing 2 rounds of abx and prednisone.  Sxs seem to be worse early in the morning and late at night.  Denies chest pain,  orthopnea, hemoptysis, fever, n/v/d, edema, headache.   Current Medications (verified): 1)  Lasix 80 Mg  Tabs (Furosemide) .... Take One Tab By Mouth Once Daily 2)  Humalog 100 Unit/ml Soln (Insulin Lispro (Human)) .... Three Times A Day (Qac) 150-100-150 Units 3)  Cyanocobalamin 1000 Mcg/ml Soln (Cyanocobalamin) .Marland Kitchen.. 1 Injection Every 3 Months 4)  Tricor 48 Mg  Tabs (Fenofibrate) .... Take One Tab By Mouth Once Daily 5)  Cozaar 100 Mg Tabs (Losartan Potassium) .... Take 1 By Mouth Once Daily 6)  Lovastatin 40 Mg Tabs (Lovastatin) .... Take 1 Two Times A Day At Bedtime 7)  Viagra 100 Mg  Tabs (Sildenafil Citrate) .... Take 1 Tablet By Mouth Once A Day As Needed 8)  Hydrocodone-Acetaminophen 5-500 Mg  Tabs (Hydrocodone-Acetaminophen) .... Take 1 Tab By Mouth Every 4 Hours As Needed Pain 9)  Humulin N 100 Unit/ml Susp (Insulin Isophane Human) .... 240 Units Every Night 10)  Nasal Cpap + 15 Cm 11)  Metformin Hcl 500 Mg Xr24h-Tab (Metformin Hcl) .Marland KitchenMarland KitchenMarland Kitchen  2 Bid 12)  Mobic 15 Mg Tabs (Meloxicam) .... As Needed 13)  Dilt-Xr 120 Mg Xr24h-Cap (Diltiazem Hcl) .Marland Kitchen.. 1 Qd 14)  Temazepam 30 Mg Caps (Temazepam) .Marland Kitchen.. 1 Qhs 15)  Hydromet 5-1.5 Mg/19ml Syrp (Hydrocodone-Homatropine) .Marland Kitchen.. 1-2 Tsp Every 4-6 Hr As Needed Cough  Allergies (verified): 1)  ! Codeine 2)  ! * Advicor 3)  ! * Actos  Past History:  Past Medical History: Last updated: 10/28/2007  ANEMIA-IRON DEFICIENCY (ICD-280.9)  DISC DISEASE, LUMBAR (ICD-722.52) PULMONARY EMBOLISM (ICD-415.19) INSOMNIA (ICD-780.52) FOOT PAIN, LEFT  (ICD-729.5) ROUTINE GENERAL MEDICAL EXAM@HEALTH  CARE FACL (ICD-V70.0) PERNICIOUS ANEMIA (ICD-281.0) PERIPHERAL NEUROPATHY (ICD-356.9) COLONIC POLYPS, HX OF (ICD-V12.72) ASTHMA (ICD-493.90) HYPERTENSION (ICD-401.9) GERD (ICD-530.81) DIABETES MELLITUS, TYPE II (ICD-250.00) ANEMIA-IRON DEFICIENCY (ICD-280.9)  Past Surgical History: Last updated: 07/27/2008 Appendectomy 1968 Back surgery 1977 Back Surgery June 14, 2008 with Dr Trenton Gammon at [facility]  Family History: Last updated: 11/20/2006 mother and older sister had breast cancer.  Social History: Last updated: 11/02/2008 married children worked [employer] - disabled due to back pain  Risk Factors: Smoking Status: never (03/25/2007)  Past Pulmonary History:  Pulmonary History: 6/00:  Polysomnogram (Obstructive Sleep Apnea) Obstructive sleep apnea was diagnosed 6/00. Polysomnogram performed at [facility], RDI 138/h, lowest destan 79% and he has been maintained on CPAP+ 15 with a nasal mask. ([facility], [place]) since.  9/09 >> duplex >>echogenic material is seen in   the left profunda femoral, femoral and popliteal veins. 02/18/08 >>residual thrombus in left prox to distal SFV 4/10 residual thrombus in Lt SFV persists  9/9 >> spiral CT >>Extensive bilateral pulmonary embolism is seen in both  right and left pulmonary arteries as well as their lobar branches. 12/07/07 CT angio shows incomplete recanalisation of pulmonary arteries  Review of Systems      See HPI  Vital Signs:  Patient profile:   65 year old male Height:      67.5 inches Weight:      280.25 pounds BMI:     43.40 O2 Sat:      95 % on Room air Temp:     98.1 degrees F oral Pulse rate:   61 / minute BP sitting:   130 / 56  (left arm) Cuff size:   large  Vitals Entered By: Raymondo Band RN (January 16, 2009 4:25 PM)  O2 Flow:  Room air CC: Acute Visit.  c/o wheezing, dry cough during the day, cough with brownish green  mucus in the morning, and chest tightness when going out in the cold or when drinking something cold.  States theses sxs have been going on since November and haven't gotten any better after finishing 2 rounds of abx and prednisone.  Sxs seem to be worse early in the morning and late at night.  requesting rx for hydromet and albuterol rescue inhaler.  Is Patient Diabetic? Yes Comments Medications reviewed with patient phone number verified with pt Raymondo Band RN  January 16, 2009 4:24 PM    Physical Exam  Additional Exam:  Gen. Pleasant, well-nourished, in no distress, normal affect, obesity  ENT - no lesions, no post nasal drip, class 2 airway Neck: No JVD, no thyromegaly, no carotid bruits Lungs:exp wheezing bilaterally. coarse BS  Cardiovascular: Rhythm regular, heart sounds  normal, no murmurs or gallops, no peripheral edema Musculoskeletal: No deformities, no cyanosis or clubbing, no calf tenderness Skin : intact       Impression & Recommendations:  Problem # 1:  BRONCHITIS,  ACUTE (ICD-466.0)  Slow to resolve exacerbation:  REC: xray today.   Mucinex DM two times a day for cough Tessalon three times a day as needed if still coughing.  Increase fluids. , use ice chips, sugarless candy, NO MINTS, to avoid coughing and throat clearing  Hydromet 1-2 tsp every 4-6hr as needed cough Zyrtec 10mg  at bedtime  Dexilant 60mg  once daily  follow up Dr. Elsworth Soho 2 weeks [facility] or [facility] office.  Please contact office for sooner follow up if symptoms do not improve or worsen   The following medications were removed from the medication list:    Augmentin 875-125 Mg Tabs (Amoxicillin-pot clavulanate) .Marland Kitchen... 1 by mouth two times a day His updated medication list for this problem includes:    Hydromet 5-1.5 Mg/10ml Syrp (Hydrocodone-homatropine) .Marland Kitchen... 1-2 tsp every 4-6 hr as needed cough    Tessalon 200 Mg Caps (Benzonatate) .Marland Kitchen... 1 by mouth three times a day  Orders: Nebulizer Tx  [phone]) Admin of Therapeutic Inj  intramuscular or subcutaneous [id number]) Depo- Medrol 40mg  (J1030) Depo- Medrol 80mg  (J1040) T-2 View CXR (71020TC) Est. Patient Level IV [id number])  Medications Added to Medication List This Visit: 1)  Tessalon 200 Mg Caps (Benzonatate) .Marland Kitchen.. 1 by mouth three times a day  Complete Medication List: 1)  Lasix 80 Mg Tabs (Furosemide) .... Take one tab by mouth once daily 2)  Humalog 100 Unit/ml Soln (Insulin lispro (human)) .... Three times a day (qac) 150-100-150 units 3)  Cyanocobalamin 1000 Mcg/ml Soln (Cyanocobalamin) .Marland Kitchen.. 1 injection every 3 months 4)  Tricor 48 Mg Tabs (Fenofibrate) .... Take one tab by mouth once daily 5)  Cozaar 100 Mg Tabs (Losartan potassium) .... Take 1 by mouth once daily 6)  Lovastatin 40 Mg Tabs (Lovastatin) .... Take 1 two times a day at bedtime 7)  Viagra 100 Mg Tabs (Sildenafil citrate) .... Take 1 tablet by mouth once a day as needed 8)  Hydrocodone-acetaminophen 5-500 Mg Tabs (Hydrocodone-acetaminophen) .... Take 1 tab by mouth every 4 hours as needed pain 9)  Humulin N 100 Unit/ml Susp (Insulin isophane human) .... 240 units every night 10)  Nasal Cpap + 15 Cm  11)  Metformin Hcl 500 Mg Xr24h-tab (Metformin hcl) .... 2 bid 12)  Mobic 15 Mg Tabs (Meloxicam) .... As needed 13)  Dilt-xr 120 Mg Xr24h-cap (Diltiazem hcl) .Marland Kitchen.. 1 qd 14)  Temazepam 30 Mg Caps (Temazepam) .Marland Kitchen.. 1 qhs 15)  Hydromet 5-1.5 Mg/73ml Syrp (Hydrocodone-homatropine) .Marland Kitchen.. 1-2 tsp every 4-6 hr as needed cough 16)  Tessalon 200 Mg Caps (Benzonatate) .Marland Kitchen.. 1 by mouth three times a day  Patient Instructions: 1)   Mucinex DM two times a day for cough 2)  Tessalon three times a day as needed if still coughing.  3)  Increase fluids. , use ice chips, sugarless candy, NO MINTS, to avoid coughing and throat clearing  4)  Hydromet 1-2 tsp every 4-6hr as needed cough 5)  Zyrtec 10mg  at bedtime  6)  I will call  xray results.  7)  Dexilant 60mg  once daily  8)  follow up  Dr. Elsworth Soho 2 weeks at [facility] or [facility] office.  9)  Please contact office for sooner follow up if symptoms do not improve or worsen  Prescriptions: HYDROMET 5-1.5 MG/5ML SYRP (HYDROCODONE-HOMATROPINE) 1-2 tsp every 4-6 hr as needed cough  #8oz x 0   Entered and Authorized by:   Rexene Edison NP   Signed by:   Rexene Edison NP on 01/16/2009  Method used:   Print then Give to Patient   RxID:   [id number] TESSALON 200 MG CAPS (BENZONATATE) 1 by mouth three times a day  #30 x 1   Entered and Authorized by:   Rexene Edison NP   Signed by:   Chistine Dematteo NP on 01/16/2009   Method used:   Print then Give to Patient   RxID:   [id number]    Medication Administration  Injection # 1:    Medication: Depo- Medrol 80mg     Diagnosis: BRONCHITIS, ACUTE (ICD-466.0)    Route: IM    Site: LUOQ gluteus    Exp Date: 10-2009    Lot #: [id number]    Mfr: teva    Patient tolerated injection without complications    Given by: Parke Poisson CNA (January 16, 2009 4:59 PM)  Injection # 2:    Medication: Depo- Medrol 40mg     Diagnosis: BRONCHITIS, ACUTE (ICD-466.0)    Route: IM    Site: LUOQ gluteus    Exp Date: 10-2009    Lot #: [id number]    Mfr: teva    Patient tolerated injection without complications    Given by: Parke Poisson CNA (January 16, 2009 5:00 PM)  Orders Added: 1)  Nebulizer Tx [phone] 2)  Admin of Therapeutic Inj  intramuscular or subcutaneous [96372] 3)  Depo- Medrol 40mg  [J1030] 4)  Depo- Medrol 80mg  [J1040] 5)  T-2 View CXR [71020TC] 6)  Est. Patient Level IV [id number]

## 2010-02-15 NOTE — Assessment & Plan Note (Signed)
Summary: Acute NP office visit - bronchitis   Primary Provider/Referring Provider:  ellison  CC:  hoarseness, wheezing, DOE, prod cough with yellow mucus, chills/sweats x5days - was given levaquin 500mg , and hydromet and pred taper by ENT at onset of symptoms.  History of Present Illness: 65yowm never smoker with  DVT/pulmonary embolism 9/09,  OSA & back surgery 6/10  by Dr Trenton Gammon. His only risk factor seems to have been that he was not very mobile and has been out of work since 6/09. There was no family history suggestive for hypercoagulable state.Off coumadin since 10/10 He has been seen earlier for a chronic cough, which was  attributed  to GERD.  3/16 >>dry cough, wheezing & dyspnea 05/18/08 >>  50 mg trazodone helping with sleep, 300 mg made him 'wired' , compliant with CPAP 11/01/08 The radiating pain in his LLE has stopped but some popliteal pain is present - neuropathic vs residual clot   December 22, 2008 --Presents for an acute office visit. saw Loanne Drilling 12-13-08 for cold symptoms, was given ceftin.  sore throat, body aches, left ear achiness, prod cough with clear/yellow mucus, increased SOB, wheezing, chills x2.5weeks. Sinus congestion not better, ears full. Ceftin did not help. Wheezing is bad today.  January 16, 2009 --Presents for an acute office visit. Complains of  wheezing, dry cough during the day, cough with brownish green mucus in the morning, and chest tightness  rx Protonix once daily  Symbicotr 160/4.5 2 puff two times a day sample Albuterol nebs only as needed  Stay on hydrocodone cough syrup & tessalon STOP mucinex  > all better and stopped medication did not refill  August 02, 2009 Acute visit.  Pt c/o sore throat and cough x 5 days- cough prod with clear to yellow sputum.  He also c/o rhinitis and sinus pressure/HA. tessilon worked the best.   January 23, 2010--Presents for an acute office visit. Complains of hoarseness, wheezing, DOE, prod cough with yellow mucus,  chills/sweats x 1 week   Was  given levaquin 500mg , hydromet and pred taper by ENT at onset of symptoms but is not feeling better. Has 2 days left of levaquin  and prednisone left. Denies chest pain,  orthopnea, hemoptysis, fever, n/v/d, edema, headache  Medications Prior to Update: 1)  Lasix 80 Mg  Tabs (Furosemide) .... Take One Tab By Mouth Once Daily 2)  Humalog 100 Unit/ml Soln (Insulin Lispro (Human)) .... Three Times A Day (Just Before Each Meal)  160-110-190 Units 3)  Cyanocobalamin 1000 Mcg/ml Soln (Cyanocobalamin) .Marland Kitchen.. 1 Injection Every 3 Months 4)  Cozaar 100 Mg Tabs (Losartan Potassium) .... Take 1 By Mouth Once Daily 5)  Lovastatin 40 Mg Tabs (Lovastatin) .... Take 2 Tab By Mouth At Bedtime 6)  Viagra 100 Mg  Tabs (Sildenafil Citrate) .... Take 1 Tablet By Mouth Once A Day As Needed 7)  Hydrocodone-Acetaminophen 5-500 Mg  Tabs (Hydrocodone-Acetaminophen) .... Take 1 Tab By Mouth Every 4 Hours As Needed Pain 8)  Humulin N 100 Unit/ml Susp (Insulin Isophane Human) .... 320 Units Every Night 9)  Nasal Cpap + 15 Cm 10)  Metformin Hcl 500 Mg Xr24h-Tab (Metformin Hcl) .... 2 Bid 11)  Mobic 15 Mg Tabs (Meloxicam) .... As Needed 12)  Dilt-Xr 120 Mg Xr24h-Cap (Diltiazem Hcl) .Marland Kitchen.. 1 Qd 13)  Temazepam 30 Mg Caps (Temazepam) .Marland Kitchen.. 1 Qhs 14)  Citalopram Hydrobromide 20 Mg Tabs (Citalopram Hydrobromide) .Marland Kitchen.. 1 Once Daily 15)  Tessalon Perles 100 Mg Caps (Benzonatate) .Marland KitchenMarland KitchenMarland Kitchen 1  Three Times A Day As Needed For Cough 16)  Omeprazole 20 Mg  Cpdr (Omeprazole) .... Take One 30-60 Min Before First and Last Meals of The Day 17)  Prednisone 10 Mg  Tabs (Prednisone) .... 4 Each Am X 2days, 2x2days, 1x2days and Stop 18)  Chlor-Trimeton 4 Mg Tabs (Chlorpheniramine Maleate) .... One At Time Bedtime and Every 6 Hours As Needed For Drainage 19)  Zostavax 19400 Unt/0.3ml Solr (Zoster Vaccine Live) .Marland Kitchen.. 1 Dose Im  Current Medications (verified): 1)  Lasix 80 Mg  Tabs (Furosemide) .... Take One Tab By Mouth Once  Daily 2)  Humalog 100 Unit/ml Soln (Insulin Lispro (Human)) .... Three Times A Day (Just Before Each Meal)  160-110-190 Units 3)  Cyanocobalamin 1000 Mcg/ml Soln (Cyanocobalamin) .Marland Kitchen.. 1 Injection Every 3 Months 4)  Cozaar 100 Mg Tabs (Losartan Potassium) .... Take 1 By Mouth Once Daily 5)  Lovastatin 40 Mg Tabs (Lovastatin) .... Take 2 Tab By Mouth At Bedtime 6)  Viagra 100 Mg  Tabs (Sildenafil Citrate) .... Take 1 Tablet By Mouth Once A Day As Needed 7)  Hydrocodone-Acetaminophen 5-500 Mg  Tabs (Hydrocodone-Acetaminophen) .... Take 1 Tab By Mouth Every 4 Hours As Needed Pain 8)  Humulin N 100 Unit/ml Susp (Insulin Isophane Human) .... 320 Units Every Night 9)  Nasal Cpap + 15 Cm 10)  Metformin Hcl 500 Mg Xr24h-Tab (Metformin Hcl) .... 2 Tabs By Mouth Two Times A Day 11)  Dilt-Xr 120 Mg Xr24h-Cap (Diltiazem Hcl) .... Take 1 Capsule By Mouth Once A Day 12)  Temazepam 30 Mg Caps (Temazepam) .... Take 1 Capsule By Mouth At Bedtime 13)  Tessalon Perles 100 Mg Caps (Benzonatate) .Marland Kitchen.. 1 Three Times A Day As Needed For Cough 14)  Omeprazole 20 Mg  Cpdr (Omeprazole) .... Take One 30-60 Min Before First and Last Meals of The Day 15)  Chlor-Trimeton 4 Mg Tabs (Chlorpheniramine Maleate) .... One At Time Bedtime and Every 6 Hours As Needed For Drainage 16)  Zostavax 19400 Unt/0.37ml Solr (Zoster Vaccine Live) .Marland Kitchen.. 1 Dose Im  Allergies (verified): 1)  ! Codeine 2)  ! * Advicor 3)  ! * Actos  Past History:  Past Medical History: Last updated: 08/02/2009 ANEMIA-IRON DEFICIENCY (ICD-280.9)  DISC DISEASE, LUMBAR (ICD-722.52) PULMONARY EMBOLISM (ICD-415.19) INSOMNIA (ICD-780.52) FOOT PAIN, LEFT (ICD-729.5) ROUTINE GENERAL MEDICAL EXAM@HEALTH  CARE FACL (ICD-V70.0) PERNICIOUS ANEMIA (ICD-281.0) PERIPHERAL NEUROPATHY (ICD-356.9) COLONIC POLYPS, HX OF (ICD-V12.72) ASTHMA (ICD-493.90) HYPERTENSION (ICD-401.9) GERD (ICD-530.81) DIABETES MELLITUS, TYPE II (ICD-250.00) ANEMIA-IRON DEFICIENCY  (ICD-280.9)  Past Surgical History: Last updated: 07/27/2008 Appendectomy 1968 Back surgery 1977 Back Surgery June 14, 2008 with Dr Trenton Gammon at [facility]  Family History: Last updated: 11/20/2006 mother and older sister had breast cancer.  Social History: Last updated: 02/06/2009 married children worked [employer] - disabled due to back pain.  Risk Factors: Smoking Status: never (03/25/2007)  Past Pulmonary History:  Pulmonary History: 6/00:  Polysomnogram (Obstructive Sleep Apnea) Obstructive sleep apnea was diagnosed 6/00. Polysomnogram performed at [facility], RDI 138/h, lowest destan 79% and he has been maintained on CPAP+ 15 with a nasal mask. ([facility], [place]) since.  9/09 >> duplex >>echogenic material is seen in   the left profunda femoral, femoral and popliteal veins. 02/18/08 >>residual thrombus in left prox to distal SFV 4/10 residual thrombus in Lt SFV persists  9/9 >> spiral CT >>Extensive bilateral pulmonary embolism is seen in both  right and left pulmonary arteries as well as their lobar branches. 12/07/07 CT angio shows incomplete recanalisation  of pulmonary arteries  Review of Systems      See HPI  Vital Signs:  Patient profile:   65 year old male Height:      67.5 inches Weight:      285.38 pounds BMI:     44.20 O2 Sat:      98 % on Room air Temp:     97.7 degrees F oral Pulse rate:   73 / minute BP sitting:   116 / 64  (left arm) Cuff size:   large  Vitals Entered By: Parke Poisson CNA/MA (January 23, 2010 3:35 PM)  O2 Flow:  Room air CC: hoarseness, wheezing, DOE, prod cough with yellow mucus, chills/sweats x5days - was given levaquin 500mg , hydromet and pred taper by ENT at onset of symptoms Is Patient Diabetic? Yes Comments Medications reviewed with patient Daytime contact number verified with patient. Parke Poisson CNA/MA  January 23, 2010 3:35 PM    Physical Exam  Additional Exam:  Gen. Pleasant,  well-nourished, in no distress, normal affect, obesity  ENT - no lesions, no post nasal drip, class 2 airway.  mild/ mod turbinate edema with mucoid secretions Neck: No JVD, no thyromegaly, no carotid bruits Lungs:exp wheezing bilaterally. coarse BS  Cardiovascular: Rhythm regular, heart sounds  normal, no murmurs or gallops, no peripheral edema Musculoskeletal: No deformities, no cyanosis or clubbing, no calf tenderness       Impression & Recommendations:  Problem # 1:  BRONCHITIS, ACUTE (ICD-466.0) Slow to resolve  xray w/no acute changes today Plan ; Extend Levaquin for additional 5 days.  Mucinex DM two times a day as needed cough Extend Prednisone 10mg  4 tabs for 3 days, then 3 tabs for 3 days, 2 tabs for 3 days, then 1 tab for 3 days, then stop  Increase fluids and rest  Hydromet 1-2 tsp every4-6 hrs as needed cough.  Please contact office for sooner follow up if symptoms do not improve or worsen    His updated medication list for this problem includes:    Tessalon Perles 100 Mg Caps (Benzonatate) .Marland Kitchen... 1 three times a day as needed for cough    Levaquin 500 Mg Tabs (Levofloxacin) .Marland Kitchen... 1 by mouth once daily    Hydromet 5-1.5 Mg/16ml Syrp (Hydrocodone-homatropine) .Marland Kitchen... 1-2 tsp every 4-6 hrs as needed cough    Albuterol Sulfate (2.5 Mg/93ml) 0.083% Nebu (Albuterol sulfate) .Marland Kitchen... 1 vial via neb every 4 hr as needed wheezing  Orders: Est. Patient Level IV [id number] T-2 View CXR (71020TC)  Medications Added to Medication List This Visit: 1)  Metformin Hcl 500 Mg Xr24h-tab (Metformin hcl) .... 2 tabs by mouth two times a day 2)  Dilt-xr 120 Mg Xr24h-cap (Diltiazem hcl) .... Take 1 capsule by mouth once a day 3)  Temazepam 30 Mg Caps (Temazepam) .... Take 1 capsule by mouth at bedtime 4)  Levaquin 500 Mg Tabs (Levofloxacin) .Marland Kitchen.. 1 by mouth once daily 5)  Prednisone 10 Mg Tabs (Prednisone) .... 4 tabs for 3 days, then 3 tabs for 3 days, 2 tabs for 3 days, then 1 tab for 3 days,  then stop 6)  Hydromet 5-1.5 Mg/89ml Syrp (Hydrocodone-homatropine) .Marland Kitchen.. 1-2 tsp every 4-6 hrs as needed cough 7)  Albuterol Sulfate (2.5 Mg/26ml) 0.083% Nebu (Albuterol sulfate) .Marland Kitchen.. 1 vial via neb every 4 hr as needed wheezing  Other Orders: Nebulizer Tx [id number])  Patient Instructions: 1)  Extend Levaquin for additional 5 days.  2)  Mucinex DM two times a day as needed cough  3)  Extend Prednisone 10mg  4 tabs for 3 days, then 3 tabs for 3 days, 2 tabs for 3 days, then 1 tab for 3 days, then stop  4)  Increase fluids and rest  5)  Hydromet 1-2 tsp every4-6 hrs as needed cough.  6)  Please contact office for sooner follow up if symptoms do not improve or worsen  Prescriptions: ALBUTEROL SULFATE (2.5 MG/3ML) 0.083% NEBU (ALBUTEROL SULFATE) 1 vial via neb every 4 hr as needed wheezing  #45 x 1   Entered and Authorized by:   Rexene Edison NP   Signed by:   Rexene Edison NP on 01/23/2010   Method used:   Electronically to        [facility]. [phone]* (retail)       [place], [place]  [address]       Ph: [phone] or [phone]       Fax: [phone]   RxID:   [id number] HYDROMET 5-1.5 MG/5ML SYRP (HYDROCODONE-HOMATROPINE) 1-2 tsp every 4-6 hrs as needed cough  #8oz x 0   Entered and Authorized by:   Rexene Edison NP   Signed by:   Chrystal Zeimet NP on 01/23/2010   Method used:   Print then Give to Patient   RxID:   [id number] PREDNISONE 10 MG TABS (PREDNISONE) 4 tabs for 3 days, then 3 tabs for 3 days, 2 tabs for 3 days, then 1 tab for 3 days, then stop  #30 x 0   Entered and Authorized by:   Rexene Edison NP   Signed by:   Rexene Edison NP on 01/23/2010   Method used:   Electronically to        [facility]. [phone]* (retail)       [place], [place]  [address]       Ph: [phone] or [phone]       Fax: [phone]   RxID:   [id number] LEVAQUIN 500 MG TABS (LEVOFLOXACIN) 1 by mouth  once daily  #5 x 0   Entered and Authorized by:   Rexene Edison NP   Signed by:   Rexene Edison NP on 01/23/2010   Method used:   Electronically to        [facility]. [phone]* (retail)       [address] [place], [place]  [address]       Ph: [phone] or [phone]       Fax: [phone]   RxID:   [id number]

## 2010-02-20 ENCOUNTER — Ambulatory Visit (INDEPENDENT_AMBULATORY_CARE_PROVIDER_SITE_OTHER): Payer: Medicare Other | Admitting: Adult Health

## 2010-02-20 ENCOUNTER — Encounter: Payer: Self-pay | Admitting: Adult Health

## 2010-02-20 DIAGNOSIS — J209 Acute bronchitis, unspecified: Secondary | ICD-10-CM

## 2010-02-20 DIAGNOSIS — J309 Allergic rhinitis, unspecified: Secondary | ICD-10-CM

## 2010-02-27 ENCOUNTER — Encounter (INDEPENDENT_AMBULATORY_CARE_PROVIDER_SITE_OTHER): Payer: Medicare Other | Admitting: Endocrinology

## 2010-02-27 ENCOUNTER — Encounter: Payer: Self-pay | Admitting: Endocrinology

## 2010-02-27 ENCOUNTER — Other Ambulatory Visit: Payer: Self-pay | Admitting: Endocrinology

## 2010-02-27 DIAGNOSIS — F329 Major depressive disorder, single episode, unspecified: Secondary | ICD-10-CM

## 2010-02-27 DIAGNOSIS — R319 Hematuria, unspecified: Secondary | ICD-10-CM | POA: Insufficient documentation

## 2010-02-27 DIAGNOSIS — J069 Acute upper respiratory infection, unspecified: Secondary | ICD-10-CM

## 2010-02-27 DIAGNOSIS — F3289 Other specified depressive episodes: Secondary | ICD-10-CM

## 2010-02-27 DIAGNOSIS — Z0000 Encounter for general adult medical examination without abnormal findings: Secondary | ICD-10-CM

## 2010-02-27 DIAGNOSIS — J019 Acute sinusitis, unspecified: Secondary | ICD-10-CM | POA: Insufficient documentation

## 2010-02-27 DIAGNOSIS — Z125 Encounter for screening for malignant neoplasm of prostate: Secondary | ICD-10-CM

## 2010-02-27 DIAGNOSIS — E119 Type 2 diabetes mellitus without complications: Secondary | ICD-10-CM

## 2010-02-27 DIAGNOSIS — D509 Iron deficiency anemia, unspecified: Secondary | ICD-10-CM

## 2010-02-27 DIAGNOSIS — I10 Essential (primary) hypertension: Secondary | ICD-10-CM

## 2010-02-27 DIAGNOSIS — Z79899 Other long term (current) drug therapy: Secondary | ICD-10-CM

## 2010-02-27 DIAGNOSIS — E785 Hyperlipidemia, unspecified: Secondary | ICD-10-CM

## 2010-02-27 LAB — URINALYSIS, ROUTINE W REFLEX MICROSCOPIC
Leukocytes, UA: NEGATIVE
Specific Gravity, Urine: 1.025 (ref 1.000–1.030)
Urine Glucose: NEGATIVE
Urobilinogen, UA: 0.2 (ref 0.0–1.0)

## 2010-02-27 LAB — HEPATIC FUNCTION PANEL
AST: 15 U/L (ref 0–37)
Albumin: 3.7 g/dL (ref 3.5–5.2)
Alkaline Phosphatase: 56 U/L (ref 39–117)
Bilirubin, Direct: 0.2 mg/dL (ref 0.0–0.3)
Total Protein: 6.6 g/dL (ref 6.0–8.3)

## 2010-02-27 LAB — CBC WITH DIFFERENTIAL/PLATELET
Basophils Relative: 1 % (ref 0.0–3.0)
Hemoglobin: 13.3 g/dL (ref 13.0–17.0)
Lymphocytes Relative: 20.9 % (ref 12.0–46.0)
Monocytes Relative: 6.3 % (ref 3.0–12.0)
Neutro Abs: 6.3 10*3/uL (ref 1.4–7.7)
Neutrophils Relative %: 69.6 % (ref 43.0–77.0)
RBC: 4.65 Mil/uL (ref 4.22–5.81)
WBC: 9 10*3/uL (ref 4.5–10.5)

## 2010-02-27 LAB — MICROALBUMIN / CREATININE URINE RATIO: Creatinine,U: 151.2 mg/dL

## 2010-02-27 LAB — IBC PANEL: Transferrin: 309.5 mg/dL (ref 212.0–360.0)

## 2010-02-27 LAB — LIPID PANEL
HDL: 26.9 mg/dL — ABNORMAL LOW (ref >39.00)
LDL Cholesterol: 54 mg/dL (ref 0–99)
Total CHOL/HDL Ratio: 4
VLDL: 28.8 mg/dL (ref 0.0–40.0)

## 2010-02-27 LAB — BASIC METABOLIC PANEL
Calcium: 9.4 mg/dL (ref 8.4–10.5)
GFR: 57.38 mL/min — ABNORMAL LOW (ref >60.00)
Sodium: 142 mEq/L (ref 135–145)

## 2010-02-28 ENCOUNTER — Other Ambulatory Visit: Payer: Self-pay | Admitting: Endocrinology

## 2010-02-28 ENCOUNTER — Telehealth: Payer: Self-pay | Admitting: Endocrinology

## 2010-02-28 DIAGNOSIS — J019 Acute sinusitis, unspecified: Secondary | ICD-10-CM

## 2010-03-01 ENCOUNTER — Encounter (INDEPENDENT_AMBULATORY_CARE_PROVIDER_SITE_OTHER): Payer: Self-pay | Admitting: *Deleted

## 2010-03-01 NOTE — Assessment & Plan Note (Signed)
Summary: Acute NP office visit - asthma   Primary Provider/Referring Provider:  ellison  CC:  prod cough with clear mucus, hoarseness, wheezing, tightness in chest, sinus pressure/congestion with clear mucus, and dizziness w/ lying down - states is the same since last OV.Marland Kitchen  History of Present Illness: 64yowm never smoker with  DVT/pulmonary embolism 9/09,  OSA & back surgery 6/10  by Dr Trenton Gammon. His only risk factor seems to have been that he was not very mobile and has been out of work since 6/09. There was no family history suggestive for hypercoagulable state.Off coumadin since 10/10 He has been seen earlier for a chronic cough, which was  attributed  to GERD.  3/16 >>dry cough, wheezing & dyspnea 05/18/08 >>  50 mg trazodone helping with sleep, 300 mg made him 'wired' , compliant with CPAP 11/01/08 The radiating pain in his LLE has stopped but some popliteal pain is present - neuropathic vs residual clot   December 22, 2008 --Presents for an acute office visit. saw Loanne Drilling 12-13-08 for cold symptoms, was given ceftin.  sore throat, body aches, left ear achiness, prod cough with clear/yellow mucus, increased SOB, wheezing, chills x2.5weeks. Sinus congestion not better, ears full. Ceftin did not help. Wheezing is bad today.  January 16, 2009 --Presents for an acute office visit. Complains of  wheezing, dry cough during the day, cough with brownish green mucus in the morning, and chest tightness  rx Protonix once daily  Symbicotr 160/4.5 2 puff two times a day sample Albuterol nebs only as needed  Stay on hydrocodone cough syrup & tessalon STOP mucinex  > all better and stopped medication did not refill  August 02, 2009 Acute visit.  Pt c/o sore throat and cough x 5 days- cough prod with clear to yellow sputum.  He also c/o rhinitis and sinus pressure/HA. tessilon worked the best.   January 23, 2010--Presents for an acute office visit. Complains of hoarseness, wheezing, DOE, prod cough with yellow  mucus, chills/sweats x 1 week   Was  given levaquin 500mg , hydromet and pred taper by ENT at onset of symptoms but is not feeling better. Has 2 days left of levaquin  and prednisone left. >>Levaquin extended along with steroid taper. February 20, 2010 --Presents for an acute office visit for persistent symptoms. Continues to have productive cough with clear mucus, hoarseness, wheezing, tightness in chest, sinus pressure/congestion with clear mucus, dizziness w/ lying down. Was seen 1 month ago for prolonged bronchitis that was not responding to abx from ENT. XRay with no acute changes. Levaquin was extended and steroid taper given . Says hiis mucus cleared but cough never went away.  Now cough is very aggravating. Worsened by cold air, laughing and at night. Denies chest pain, orthopnea, hemoptysis, fever, n/v/d, edema, headache.   Medications Prior to Update: 1)  Lasix 80 Mg  Tabs (Furosemide) .... Take One Tab By Mouth Once Daily 2)  Humalog 100 Unit/ml Soln (Insulin Lispro (Human)) .... Three Times A Day (Just Before Each Meal)  160-110-190 Units 3)  Cyanocobalamin 1000 Mcg/ml Soln (Cyanocobalamin) .Marland Kitchen.. 1 Injection Every 3 Months 4)  Cozaar 100 Mg Tabs (Losartan Potassium) .... Take 1 By Mouth Once Daily 5)  Lovastatin 40 Mg Tabs (Lovastatin) .... Take 2 Tab By Mouth At Bedtime 6)  Viagra 100 Mg  Tabs (Sildenafil Citrate) .... Take 1 Tablet By Mouth Once A Day As Needed 7)  Hydrocodone-Acetaminophen 5-500 Mg  Tabs (Hydrocodone-Acetaminophen) .... Take 1 Tab By  Mouth Every 4 Hours As Needed Pain 8)  Humulin N 100 Unit/ml Susp (Insulin Isophane Human) .... 320 Units Every Night 9)  Nasal Cpap + 15 Cm 10)  Metformin Hcl 500 Mg Xr24h-Tab (Metformin Hcl) .... 2 Tabs By Mouth Two Times A Day 11)  Dilt-Xr 120 Mg Xr24h-Cap (Diltiazem Hcl) .... Take 1 Capsule By Mouth Once A Day 12)  Temazepam 30 Mg Caps (Temazepam) .... Take 1 Capsule By Mouth At Bedtime 13)  Tessalon Perles 100 Mg Caps (Benzonatate) .Marland Kitchen..  1 Three Times A Day As Needed For Cough 14)  Omeprazole 20 Mg  Cpdr (Omeprazole) .... Take One 30-60 Min Before First and Last Meals of The Day 15)  Chlor-Trimeton 4 Mg Tabs (Chlorpheniramine Maleate) .... One At Time Bedtime and Every 6 Hours As Needed For Drainage 16)  Zostavax 19400 Unt/0.39ml Solr (Zoster Vaccine Live) .Marland Kitchen.. 1 Dose Im 17)  Levaquin 500 Mg Tabs (Levofloxacin) .Marland Kitchen.. 1 By Mouth Once Daily 18)  Prednisone 10 Mg Tabs (Prednisone) .... 4 Tabs For 3 Days, Then 3 Tabs For 3 Days, 2 Tabs For 3 Days, Then 1 Tab For 3 Days, Then Stop 19)  Hydromet 5-1.5 Mg/82ml Syrp (Hydrocodone-Homatropine) .Marland Kitchen.. 1-2 Tsp Every 4-6 Hrs As Needed Cough 20)  Albuterol Sulfate (2.5 Mg/22ml) 0.083% Nebu (Albuterol Sulfate) .Marland Kitchen.. 1 Vial Via Neb Every 4 Hr As Needed Wheezing  Current Medications (verified): 1)  Lasix 80 Mg  Tabs (Furosemide) .... Take One Tab By Mouth Once Daily 2)  Humalog 100 Unit/ml Soln (Insulin Lispro (Human)) .... Three Times A Day (Just Before Each Meal)  160-110-190 Units 3)  Cyanocobalamin 1000 Mcg/ml Soln (Cyanocobalamin) .Marland Kitchen.. 1 Injection Every 3 Months 4)  Cozaar 100 Mg Tabs (Losartan Potassium) .... Take 1 By Mouth Once Daily 5)  Lovastatin 40 Mg Tabs (Lovastatin) .... Take 2 Tab By Mouth At Bedtime 6)  Viagra 100 Mg  Tabs (Sildenafil Citrate) .... Take 1 Tablet By Mouth Once A Day As Needed 7)  Hydrocodone-Acetaminophen 5-500 Mg  Tabs (Hydrocodone-Acetaminophen) .... Take 1 Tab By Mouth Every 4 Hours As Needed Pain 8)  Humulin N 100 Unit/ml Susp (Insulin Isophane Human) .... 320 Units Every Night 9)  Nasal Cpap + 15 Cm 10)  Metformin Hcl 500 Mg Xr24h-Tab (Metformin Hcl) .... 2 Tabs By Mouth Two Times A Day 11)  Dilt-Xr 120 Mg Xr24h-Cap (Diltiazem Hcl) .... Take 1 Capsule By Mouth Once A Day 12)  Temazepam 30 Mg Caps (Temazepam) .... Take 1 Capsule By Mouth At Bedtime 13)  Tessalon Perles 100 Mg Caps (Benzonatate) .Marland Kitchen.. 1 Three Times A Day As Needed For Cough 14)  Omeprazole 20 Mg   Cpdr (Omeprazole) .... Take One 30-60 Min Before First and Last Meals of The Day 15)  Chlor-Trimeton 4 Mg Tabs (Chlorpheniramine Maleate) .... One At Time Bedtime and Every 6 Hours As Needed For Drainage 16)  Hydromet 5-1.5 Mg/31ml Syrp (Hydrocodone-Homatropine) .Marland Kitchen.. 1-2 Tsp Every 4-6 Hrs As Needed Cough 17)  Albuterol Sulfate (2.5 Mg/39ml) 0.083% Nebu (Albuterol Sulfate) .Marland Kitchen.. 1 Vial Via Neb Every 4 Hr As Needed Wheezing  Allergies (verified): 1)  ! Codeine 2)  ! * Advicor 3)  ! * Actos  Past History:  Past Medical History: Last updated: 08/02/2009 ANEMIA-IRON DEFICIENCY (ICD-280.9)  DISC DISEASE, LUMBAR (ICD-722.52) PULMONARY EMBOLISM (ICD-415.19) INSOMNIA (ICD-780.52) FOOT PAIN, LEFT (ICD-729.5) ROUTINE GENERAL MEDICAL EXAM@HEALTH  CARE FACL (ICD-V70.0) PERNICIOUS ANEMIA (ICD-281.0) PERIPHERAL NEUROPATHY (ICD-356.9) COLONIC POLYPS, HX OF (ICD-V12.72) ASTHMA (ICD-493.90) HYPERTENSION (ICD-401.9) GERD (ICD-530.81) DIABETES MELLITUS, TYPE  II (ICD-250.00) ANEMIA-IRON DEFICIENCY (ICD-280.9)  Past Surgical History: Last updated: 07/27/2008 Appendectomy 1968 Back surgery 1977 Back Surgery June 14, 2008 with Dr Trenton Gammon at [facility]  Family History: Last updated: 11/20/2006 mother and older sister had breast cancer.  Social History: Last updated: 02/06/2009 married children worked [employer] - disabled due to back pain.  Risk Factors: Smoking Status: never (03/25/2007)  Past Pulmonary History:  Pulmonary History: 6/00:  Polysomnogram (Obstructive Sleep Apnea) Obstructive sleep apnea was diagnosed 6/00. Polysomnogram performed at [facility], RDI 138/h, lowest destan 79% and he has been maintained on CPAP+ 15 with a nasal mask. ([facility], [place]) since.  9/09 >> duplex >>echogenic material is seen in   the left profunda femoral, femoral and popliteal veins. 02/18/08 >>residual thrombus in left prox to distal SFV 4/10 residual thrombus in  Lt SFV persists  9/9 >> spiral CT >>Extensive bilateral pulmonary embolism is seen in both  right and left pulmonary arteries as well as their lobar branches. 12/07/07 CT angio shows incomplete recanalisation of pulmonary arteries  Review of Systems      See HPI  Vital Signs:  Patient profile:   65 year old male Height:      67.5 inches Weight:      286.25 pounds BMI:     44.33 O2 Sat:      97 % on Room air Temp:     97.1 degrees F oral Pulse rate:   56 / minute BP sitting:   114 / 52  (left arm) Cuff size:   large  Vitals Entered By: Parke Poisson CNA/MA (February 20, 2010 10:57 AM)  O2 Flow:  Room air CC: prod cough with clear mucus, hoarseness, wheezing, tightness in chest, sinus pressure/congestion with clear mucus, dizziness w/ lying down - states is the same since last OV. Is Patient Diabetic? Yes Comments Medications reviewed with patient Daytime contact number verified with patient. Parke Poisson CNA/MA  February 20, 2010 10:58 AM    Physical Exam  Additional Exam:  Gen. Pleasant, well-nourished, in no distress, normal affect, obesity  ENT - no lesions, no post nasal drip, class 2 airway.  mild/ mod turbinate edema wth clear secretion,  Neck: No JVD, no thyromegaly, no carotid bruits Lungs: coarse BS  with upper airway psuedowheeze. Cardiovascular: Rhythm regular, heart sounds  normal, no murmurs or gallops, no peripheral edema Musculoskeletal: No deformities, no cyanosis or clubbing, no calf tenderness       Impression & Recommendations:  Problem # 1:  BRONCHITIS, ACUTE (ICD-466.0)  Slow to resolve bronchitis with upper airway cough syndrome. Depo Medrol 120mg  IM. Plan; Mucinex DM two times a day Tessalon three times a day  Tramadol 50mg  every 4 hr as needed cough  Chlortrimeton 4mg  2 at bedtime  May take Chlortrimeton 4mg  every 4-6 hr as needed nasal drip, throat clearing or driange.  Sugarless candy , ice chips, water to help prevent cough and throat  clearing-NO MINTS Increase Omeprazole 20mg  two times a day before meal  Add Pepcid 20mg  at bedtime  follow up ENT in next couple of weeks follow up Dr. Elsworth Soho in 4 weeks  Please contact office for sooner follow up if symptoms do not improve or worsen  The following medications were removed from the medication list:    Levaquin 500 Mg Tabs (Levofloxacin) .Marland Kitchen... 1 by mouth once daily His updated medication list for this problem includes:    Tessalon 200 Mg Caps (Benzonatate) .Marland Kitchen... 1 by mouth  three times a day as needed cough    Hydromet 5-1.5 Mg/64ml Syrp (Hydrocodone-homatropine) .Marland Kitchen... 1-2 tsp every 4-6 hrs as needed cough    Albuterol Sulfate (2.5 Mg/23ml) 0.083% Nebu (Albuterol sulfate) .Marland Kitchen... 1 vial via neb every 4 hr as needed wheezing  Orders: Est. Patient Level IV [id number])  Medications Added to Medication List This Visit: 1)  Tessalon 200 Mg Caps (Benzonatate) .Marland Kitchen.. 1 by mouth three times a day as needed cough  Patient Instructions: 1)  Mucinex DM two times a day 2)  Tessalon three times a day  3)  Tramadol 50mg  every 4 hr as needed cough  4)  Chlortrimeton 4mg  2 at bedtime  5)  May take Chlortrimeton 4mg  every 4-6 hr as needed nasal drip, throat clearing or driange.  6)  Sugarless candy , ice chips, water to help prevent cough and throat clearing-NO MINTS 7)  Increase Omeprazole 20mg  two times a day before meal  8)  Add Pepcid 20mg  at bedtime  9)  follow up ENT in next couple of weeks 10)  follow up Dr. Elsworth Soho in 4 weeks  11)  Please contact office for sooner follow up if symptoms do not improve or worsen  Prescriptions: TESSALON 200 MG CAPS (BENZONATATE) 1 by mouth three times a day as needed cough  #60 x 3   Entered and Authorized by:   Rexene Edison NP   Signed by:   Rexene Edison NP on 02/20/2010   Method used:   Electronically to        [facility]. [phone]* (retail)       [address] [place], [place]  [address]       Ph: [phone] or  [phone]       Fax: [phone]   RxID:   [id number]

## 2010-03-02 ENCOUNTER — Ambulatory Visit (INDEPENDENT_AMBULATORY_CARE_PROVIDER_SITE_OTHER)
Admission: RE | Admit: 2010-03-02 | Discharge: 2010-03-02 | Disposition: A | Discharge status: Home / Self Care | Payer: Medicare Other | Source: Ambulatory Visit | Attending: Endocrinology | Admitting: Endocrinology

## 2010-03-02 DIAGNOSIS — J019 Acute sinusitis, unspecified: Secondary | ICD-10-CM

## 2010-03-07 NOTE — Assessment & Plan Note (Signed)
Summary: YEARLY-RS'D FROM JAN PER PT--STC   Vital Signs:  Patient profile:   65 year old male Height:      67.5 inches (171.45 cm) Weight:      281 pounds (127.73 kg) BMI:     43.52 O2 Sat:      95 % on Room air Temp:     98.5 degrees F (36.94 degrees C) oral Pulse rate:   55 / minute BP sitting:   122 / 50  (left arm) Cuff size:   large  Vitals Entered By: Rebeca Alert CMA Deborra Medina) (February 27, 2010 9:50 AM)  O2 Flow:  Room air CC: 3 month F/U/per pt/aj Is Patient Diabetic? Yes   Primary Provider:  Tracee Mccreery  CC:  3 month F/U/per pt/aj.  History of Present Illness: pt is here for medicare welllness visit.  he denies memory loss and depression.  he says he is able to perform activities of daily living without assistance.    Current Medications (verified): 1)  Lasix 80 Mg  Tabs (Furosemide) .... Take One Tab By Mouth Once Daily 2)  Humalog 100 Unit/ml Soln (Insulin Lispro (Human)) .... Three Times A Day (Just Before Each Meal)  160-110-190 Units 3)  Cyanocobalamin 1000 Mcg/ml Soln (Cyanocobalamin) .Marland Kitchen.. 1 Injection Every 3 Months 4)  Cozaar 100 Mg Tabs (Losartan Potassium) .... Take 1 By Mouth Once Daily 5)  Lovastatin 40 Mg Tabs (Lovastatin) .... Take 2 Tab By Mouth At Bedtime 6)  Viagra 100 Mg  Tabs (Sildenafil Citrate) .... Take 1 Tablet By Mouth Once A Day As Needed 7)  Hydrocodone-Acetaminophen 5-500 Mg  Tabs (Hydrocodone-Acetaminophen) .... Take 1 Tab By Mouth Every 4 Hours As Needed Pain 8)  Humulin N 100 Unit/ml Susp (Insulin Isophane Human) .... 320 Units Every Night 9)  Nasal Cpap + 15 Cm 10)  Metformin Hcl 500 Mg Xr24h-Tab (Metformin Hcl) .... 2 Tabs By Mouth Two Times A Day 11)  Dilt-Xr 120 Mg Xr24h-Cap (Diltiazem Hcl) .... Take 1 Capsule By Mouth Once A Day 12)  Temazepam 30 Mg Caps (Temazepam) .... Take 1 Capsule By Mouth At Bedtime 13)  Tessalon 200 Mg Caps (Benzonatate) .Marland Kitchen.. 1 By Mouth Three Times A Day As Needed Cough 14)  Omeprazole 20 Mg  Cpdr (Omeprazole)  .... Take One 30-60 Min Before First and Last Meals of The Day 15)  Chlor-Trimeton 4 Mg Tabs (Chlorpheniramine Maleate) .... One At Time Bedtime and Every 6 Hours As Needed For Drainage 16)  Hydromet 5-1.5 Mg/28ml Syrp (Hydrocodone-Homatropine) .Marland Kitchen.. 1-2 Tsp Every 4-6 Hrs As Needed Cough 17)  Albuterol Sulfate (2.5 Mg/92ml) 0.083% Nebu (Albuterol Sulfate) .Marland Kitchen.. 1 Vial Via Neb Every 4 Hr As Needed Wheezing 18)  Tramadol Hcl 50 Mg Tabs (Tramadol Hcl) .Marland Kitchen.. 1 Every 4 Hr As Needed 19)  Pepcid Ac 10 Mg Tabs (Famotidine) .Marland Kitchen.. 1 Tablet By Mouth At Bedtime  Allergies (verified): 1)  ! Codeine 2)  ! * Advicor 3)  ! * Actos  Past History:  Past Medical History: ANEMIA-IRON DEFICIENCY (ICD-280.9)  DISC DISEASE, LUMBAR (ICD-722.52) PULMONARY EMBOLISM (ICD-415.19) INSOMNIA (ICD-780.52) FOOT PAIN, LEFT (ICD-729.5) ROUTINE GENERAL MEDICAL EXAM@HEALTH  CARE FACL (ICD-V70.0) PERNICIOUS ANEMIA (ICD-281.0) PERIPHERAL NEUROPATHY (ICD-356.9) COLONIC POLYPS, HX OF (ICD-V12.72) ASTHMA (ICD-493.90) HYPERTENSION (ICD-401.9) GERD (ICD-530.81) DIABETES MELLITUS, TYPE II (ICD-250.00) ANEMIA-IRON DEFICIENCY (ICD-280.9)  pulm: alva opthal: [place] ent: [place] neurosurg:  poole  Family History: Reviewed history from 11/20/2006 and no changes required. mother and older sister had breast cancer.  Social History: Reviewed history from 02/06/2009  and no changes required. married children worked [employer] - disabled due to back pain. activity is severely limited by medical problems. diet is "good." never a smoker alcohol:  none  Review of Systems  The patient denies vision loss and decreased hearing.    Physical Exam  General:  morbidly obese.  no distress  Eyes:  (sees opthal) Ears:  grossly normal hearing.   Msk:  pt easily and quickly performs "get-up-and-go" from a sitting position  Psych:  remembers 3/3 at 5 minutes.  excellent recall.  can easily read and write a sentence.   alert and oriented x 3 Additional Exam:  SEPARATE EVALUATION FOLLOWS--EACH PROBLEM HERE IS NEW, NOT RESPONDING TO TREATMENT, OR POSES SIGNIFICANT RISK TO THE PATIENT'S HEALTH: HISTORY OF THE PRESENT ILLNESS: pt has 2 mos of moderate congestion in the nose, and assoc prod-quality cough.   temazepam does not help insomnia, even prior to #1.  his sleep apnea mask has broken.   he brings a record of his cbg's which i have reviewed today.  it is in the 200's for a week after steriods (which he has had several times over the past few mos).   PAST MEDICAL HISTORY reviewed and up to date today REVIEW OF SYSTEMS: denies hypoglycemia and sob PHYSICAL EXAMINATION: clear to auscultation.  no respiratory distress head: no deformity eyes: no periorbital swelling, no proptosis external nose and ears are normal mouth: no lesion seen psych:  does not appear anxious nor depressed IMPRESSION: insomnia, poss exac by his broken cpap mask dm, needs increased rx persistent uri's PLAN: see instruction sheet   Impression & Recommendations:  Problem # 1:  ROUTINE GENERAL MEDICAL EXAM@HEALTH  CARE FACL (ICD-V70.0)  Medications Added to Medication List This Visit: 1)  Humalog 100 Unit/ml Soln (Insulin lispro (human)) .... Three times a day (just before each meal)  170-120-200 units 2)  Pepcid Ac 10 Mg Tabs (Famotidine) .Marland Kitchen.. 1 tablet by mouth at bedtime 3)  Cpap Mask  .Marland Kitchen.. 780.57  Other Orders: Radiology Referral (Radiology) TLB-Lipid Panel (80061-LIPID) TLB-BMP (Basic Metabolic Panel-BMET) (99991111) TLB-CBC Platelet - w/Differential (85025-CBCD) TLB-Hepatic/Liver Function Pnl (80076-HEPATIC) TLB-TSH (Thyroid Stimulating Hormone) (84443-TSH) TLB-PSA (Prostate Specific Antigen) (84153-PSA) TLB-A1C / Hgb A1C (Glycohemoglobin) (83036-A1C) TLB-Microalbumin/Creat Ratio, Urine (82043-MALB) TLB-Udip w/ Micro (81001-URINE) TLB-IBC Pnl (Iron/FE;Transferrin) (83550-IBC) Est. Patient Level IV  [id number]) Medicare -1st Annual Wellness Visit [phone])   Patient Instructions: 1)  check ct of the paranasal sinuses.  you will be called with a day and time for an appointment. 2)  please see [place] ent for follow-up of this.   3)  blood tests are being ordered for you today.  please call [phone] to hear your test results. 4)  pending the test results, please increase the humalog to three times a day (just before each meal) 170-120-200 units. 5)  please consider these measures for your health:  minimize alcohol.  do not use tobacco products.  have a colonoscopy at least every 10 years from age 65.  keep firearms safely stored.  always use seat belts.  have working smoke alarms in your home.  see an eye doctor and dentist regularly.  never drive under the influence of alcohol or drugs (including prescription drugs).  those with fair skin should take precautions against the sun. 6)  please let me know what your wishes would be, if artificial life support measures should become necessary.  it is critically important to prevent falling down (keep floor areas well-lit, dry, and free of loose  objects) 7)  here is a list of medicare-covered preventive services. 8)  Please schedule a follow-up appointment in 3 months. 9)  here is a prescription for a new cpap mask, pending your appointment with dr Elsworth Soho. Prescriptions: HYDROCODONE-ACETAMINOPHEN 5-500 MG  TABS (HYDROCODONE-ACETAMINOPHEN) take 1 tab by mouth every 4 hours as needed pain  #100 x 1   Entered and Authorized by:   Donavan Foil MD   Signed by:   Donavan Foil MD on 02/27/2010   Method used:   Print then Give to Patient   RxID:   [id number] CPAP MASK 780.57  #1 x 0   Entered and Authorized by:   Donavan Foil MD   Signed by:   Donavan Foil MD on 02/27/2010   Method used:   Print then Give to Patient   RxID:   [id number]    Orders Added: 1)  Radiology Referral [Radiology] 2)  TLB-Lipid Panel [80061-LIPID] 3)  TLB-BMP  (Basic Metabolic Panel-BMET) [id number] 4)  TLB-CBC Platelet - w/Differential [85025-CBCD] 5)  TLB-Hepatic/Liver Function Pnl [80076-HEPATIC] 6)  TLB-TSH (Thyroid Stimulating Hormone) [84443-TSH] 7)  TLB-PSA (Prostate Specific Antigen) [84153-PSA] 8)  TLB-A1C / Hgb A1C (Glycohemoglobin) [83036-A1C] 9)  TLB-Microalbumin/Creat Ratio, Urine [82043-MALB] 10)  TLB-Udip w/ Micro [81001-URINE] 11)  TLB-IBC Pnl (Iron/FE;Transferrin) [83550-IBC] 12)  Est. Patient Level IV [id number] 13)  Medicare -1st Annual Wellness Visit [id number]

## 2010-03-07 NOTE — Letter (Signed)
Summary: Primary Care Consult Scheduled Letter  [facility]   [place], [place] [address]   Phone: [phone]  Fax: [phone]      03/01/2010 MRN: [id number]  Donald [name] [address] [place], [place]  [address]  [place]    Dear [name],      We have scheduled an appointment for you.  At the recommendation of Dr.ELLSION, we have scheduled you a consult with [facility] (DR DAHLSHEDT) on MARCH 15.2012 at 8:15AM. Their address is [address] [place], [place]. The office phone number is [phone]. If this appointment day and time is not convenient for you, please feel free to call the office of the doctor you are being referred to at the number listed above and reschedule the appointment.     It is important for you to keep your scheduled appointments. We are here to make sure you are given good patient care. If you have questions or you have made changes to your appointment, please notify us at  [phone], ask for DEBRA      Patient Care Coordinator [facility]

## 2010-03-13 NOTE — Progress Notes (Signed)
Summary: patient need refills faxed to new mail order co.  Phone Note Call from Patient   Caller: Patient Call For: Donavan Foil MD Reason for Call: Refill Medication Summary of Call: Patient called stating that he has changed his pharmacy [facility] and he needs his rx refill faxed in to fax number [phone].  Diltiazem HCL 120mg , Losartan 100mg , and Temazepam 30mg . Please Advise. Pt Id number with them is [id number]. Initial call taken by: Fleet Contras,  February 28, 2010 3:43 PM  Follow-up for Phone Call        [facility] for 3 mth supply w/3 refills?  Follow-up by: Charlsie Quest, [place],  February 28, 2010 6:38 PM  Additional Follow-up for Phone Call Additional follow up Details #1::        i refilled diltiazem and losartan.  for the temazepam, i need the voided prescription refill from current pharmacy.  pt may find it easier to just buy last 90-day refill from current pharmacy, as it is cheap. Additional Follow-up by: Donavan Foil MD,  February 28, 2010 6:46 PM    Additional Follow-up for Phone Call Additional follow up Details #2::    [id number] that he cannot get refill from [facility] because his Insurance has changed. Pt he has be advised that [facility] has cancelled all exsisting prescriptions. Pt is requesting Rx to [facility] mail order or to local pharmacy. Please advise, pt aware MD out of office until 03/05/2010 Follow-up by: Crissie Sickles, [place],  March 02, 2010 8:12 AM  Additional Follow-up for Phone Call Additional follow up Details #3:: Details for Additional Follow-up Action Taken: please call [facility] and cancel the temazepam rx.  then please route message back to me so i can print rx to send to new pharmacy, when i am back in the office. Additional Follow-up by: Donavan Foil MD,  March 02, 2010 8:25 AM  Prescriptions: TEMAZEPAM 30 MG CAPS (TEMAZEPAM) Take 1 capsule by mouth at bedtime  #90 x 1   Entered and Authorized by:    Donavan Foil MD   Signed by:   Donavan Foil MD on 03/05/2010   Method used:   Print then Give to Patient   RxID:   [id number] COZAAR 100 MG TABS (LOSARTAN POTASSIUM) take 1 by mouth once daily  #90 x 3   Entered and Authorized by:   Donavan Foil MD   Signed by:   Donavan Foil MD on 02/28/2010   Method used:   Electronically to        [facility] (mail-order)       [place]  [address]       Ph: [phone]       Fax: [phone]   [id number] DILT-XR 120 MG XR24H-CAP (DILTIAZEM HCL) Take 1 capsule by mouth once a day  #90 x 3   Entered and Authorized by:   Donavan Foil MD   Signed by:   Donavan Foil MD on 02/28/2010   Method used:   Electronically to        [facility] (mail-order)       [place]  [address]       Ph: [phone]       Fax: [phone]   [id number]  Temazepam Rx canceled per [facility].  Crissie Sickles, CMA  March 05, 2010 2:24 PM   i printed  Rx faxed per pt req. Crissie Sickles, CMA  March 05, 2010 2:34 PM

## 2010-03-26 ENCOUNTER — Ambulatory Visit (INDEPENDENT_AMBULATORY_CARE_PROVIDER_SITE_OTHER): Payer: Medicare Other | Admitting: Pulmonary Disease

## 2010-03-26 ENCOUNTER — Encounter: Payer: Self-pay | Admitting: Pulmonary Disease

## 2010-03-26 ENCOUNTER — Encounter: Payer: Self-pay | Admitting: Endocrinology

## 2010-03-26 ENCOUNTER — Ambulatory Visit (INDEPENDENT_AMBULATORY_CARE_PROVIDER_SITE_OTHER): Payer: Medicare Other | Admitting: Endocrinology

## 2010-03-26 DIAGNOSIS — E119 Type 2 diabetes mellitus without complications: Secondary | ICD-10-CM

## 2010-03-26 DIAGNOSIS — G4730 Sleep apnea, unspecified: Secondary | ICD-10-CM

## 2010-03-26 DIAGNOSIS — N200 Calculus of kidney: Secondary | ICD-10-CM | POA: Insufficient documentation

## 2010-03-26 DIAGNOSIS — I2699 Other pulmonary embolism without acute cor pulmonale: Secondary | ICD-10-CM

## 2010-03-26 DIAGNOSIS — N209 Urinary calculus, unspecified: Secondary | ICD-10-CM

## 2010-03-28 ENCOUNTER — Encounter: Payer: Self-pay | Admitting: Endocrinology

## 2010-03-28 ENCOUNTER — Ambulatory Visit: Payer: No Typology Code available for payment source

## 2010-04-03 NOTE — Medication Information (Signed)
Summary: Diabetic Supplies / [facility] / [facility]   Imported By: Rise Patience 03/29/2010 14:14:21  _____________________________________________________________________  External Attachment:    Type:   Image     Comment:   External Document

## 2010-04-03 NOTE — Assessment & Plan Note (Signed)
Summary: 4 WEEK FOLLOW-UP/LED   Visit Type:  Follow-up Primary Provider/Referring Provider:  ellison  CC:  Pt states cough is less. Pt states Medicare advised him he will have to get a new sleep study for new parts.  History of Present Illness: 64yowm never smoker for FU of obstructive sleep apnea & asthmatic bronchitis 1-2 episodes /year. He developed  DVT/pulmonary embolism 9/09 -seemingly idiopathic,subseq underwent back surgery 6/10  by Dr Trenton Gammon- Off coumadin since 10/10 He has been seen earlier for a chronic cough, which was  attributed  to GERD.  05/18/08 >>  50 mg trazodone helping with sleep, 300 mg made him 'wired'  January 23, 2010--Presents for an acute office visit. Complains of hoarseness, wheezing, DOE, prod cough with yellow mucus, chills/sweats x 1 week   Was  given levaquin 500mg , hydromet and pred taper by ENT at onset  >>Levaquin extended along with steroid taper.  March 26, 2010  Sino-bronchitic symptoms finally improved after addition of nasal spray by ENt ? allergic. He wants a new cpap machine & supplies, now on medicare & needs re-certification. Compliant with CPAP February 20, 2010 --Presents for an acute office visit for persistent symptoms. Continues to have productive cough with clear mucus, hoarseness, wheezing, tightness in chest, sinus pressure/congestion with clear mucus, dizziness w/ lying down. Was seen 1 month ago for prolonged bronchitis that was not responding to abx from ENT. XRay with no acute changes. Levaquin was extended and steroid taper given . Says hiis mucus cleared but cough never went away.  Now cough is very aggravating. Worsened by cold air, laughing and at night. >> medrol shot & levaquin, saw ENT > ? steroid spray for allergies   March 26, 2010 2:45 PM    Denies chest pain, orthopnea, hemoptysis, fever, n/v/d, edema, headache.   Preventive Screening-Counseling & Management  Alcohol-Tobacco     Smoking Status: never  Current Medications  (verified): 1)  Lasix 80 Mg  Tabs (Furosemide) .... Take One Tab By Mouth Once Daily 2)  Humalog 100 Unit/ml Soln (Insulin Lispro (Human)) .... Three Times A Day (Just Before Each Meal)  170-120-200 Units 3)  Cyanocobalamin 1000 Mcg/ml Soln (Cyanocobalamin) .Marland Kitchen.. 1 Injection Every 3 Months 4)  Cozaar 100 Mg Tabs (Losartan Potassium) .... Take 1 By Mouth Once Daily 5)  Lovastatin 40 Mg Tabs (Lovastatin) .... Take 2 Tab By Mouth At Bedtime 6)  Viagra 100 Mg  Tabs (Sildenafil Citrate) .... Take 1 Tablet By Mouth Once A Day As Needed 7)  Hydrocodone-Acetaminophen 5-500 Mg  Tabs (Hydrocodone-Acetaminophen) .... Take 1 Tab By Mouth Every 4 Hours As Needed Pain 8)  Humulin N 100 Unit/ml Susp (Insulin Isophane Human) .... 320 Units Every Night 9)  Nasal Cpap + 15 Cm 10)  Metformin Hcl 500 Mg Xr24h-Tab (Metformin Hcl) .... 2 Tabs By Mouth Two Times A Day 11)  Dilt-Xr 120 Mg Xr24h-Cap (Diltiazem Hcl) .... Take 1 Capsule By Mouth Once A Day 12)  Temazepam 30 Mg Caps (Temazepam) .... Take 1 Capsule By Mouth At Bedtime 13)  Albuterol Sulfate (2.5 Mg/6ml) 0.083% Nebu (Albuterol Sulfate) .Marland Kitchen.. 1 Vial Via Neb Every 4 Hr As Needed Wheezing 14)  Pepcid Ac 10 Mg Tabs (Famotidine) .Marland Kitchen.. 1 Tablet By Mouth At Bedtime 15)  Cpap Mask .Marland Kitchen.. 780.57  Allergies (verified): 1)  ! Codeine 2)  ! * Advicor 3)  ! * Actos  Past History:  Past Medical History: Last updated: 02/27/2010 ANEMIA-IRON DEFICIENCY (ICD-280.9)  DISC DISEASE, LUMBAR (ICD-722.52) PULMONARY  EMBOLISM (ICD-415.19) INSOMNIA (ICD-780.52) FOOT PAIN, LEFT (ICD-729.5) ROUTINE GENERAL MEDICAL EXAM@HEALTH  CARE FACL (ICD-V70.0) PERNICIOUS ANEMIA (ICD-281.0) PERIPHERAL NEUROPATHY (ICD-356.9) COLONIC POLYPS, HX OF (ICD-V12.72) ASTHMA (ICD-493.90) HYPERTENSION (ICD-401.9) GERD (ICD-530.81) DIABETES MELLITUS, TYPE II (ICD-250.00) ANEMIA-IRON DEFICIENCY (ICD-280.9)  pulm: Halyn Flaugher opthal: ding[place]ein ent: [place] neurosurg:  poole  Social History: Last  updated: 02/27/2010 married children worked [employer] - disabled due to back pain. activity is severely limited by medical problems. diet is "good." never a smoker alcohol:  none  Past Pulmonary History:  Pulmonary History: 6/00:  Polysomnogram  at [facility], RDI 138/h, lowest desatn 77%,maintained on CPAP+ 15 with a nasal mask. ([facility], [place]) since.  9/09 >> duplex >>echogenic material is seen in   the left profunda femoral, femoral and popliteal veins. 02/18/08 >>residual thrombus in left prox to distal SFV 4/10 residual thrombus in Lt SFV persists  9/09 >> spiral CT >>Extensive bilateral pulmonary embolism is seen in both  right and left pulmonary arteries as well as their lobar branches. 12/07/07 CT angio shows incomplete recanalisation of pulmonary arteries  Review of Systems  The patient denies anorexia, fever, weight loss, weight gain, vision loss, decreased hearing, hoarseness, chest pain, syncope, dyspnea on exertion, peripheral edema, prolonged cough, headaches, hemoptysis, abdominal pain, melena, hematochezia, severe indigestion/heartburn, hematuria, muscle weakness, suspicious skin lesions, transient blindness, difficulty walking, depression, unusual weight change, abnormal bleeding, and enlarged lymph nodes.    Vital Signs:  Patient profile:   65 year old male Height:      67.5 inches Weight:      290.4 pounds BMI:     44.97 O2 Sat:      98 % on Room air Temp:     98.8 degrees F oral Pulse rate:   59 / minute BP sitting:   140 / 60  (right arm) Cuff size:   large  Vitals Entered By: Iran Planas CMA (March 26, 2010 2:14 PM)  O2 Flow:  Room air CC: Pt states cough is less. Pt states Medicare advised him he will have to get a new sleep study for new parts Comments Medications reviewed with patient Verified contact number and pharmacy with patient Iran Planas CMA  March 26, 2010 2:15 PM    Physical  Exam  Additional Exam:  Gen. Pleasant, well-nourished, in no distress, normal affect, obesity  ENT - no lesions, no post nasal drip, class 2 airway.  mild/ mod turbinate edema wth clear secretion,  Neck: No JVD, no thyromegaly, no carotid bruits Lungs: coarse BS  with upper airway psuedowheeze. Cardiovascular: Rhythm regular, heart sounds  normal, no murmurs or gallops, no peripheral edema Musculoskeletal: No deformities, no cyanosis or clubbing, no calf tenderness       Impression & Recommendations:  Problem # 1:  PULMONARY EMBOLISM (ICD-415.19)  Off coumadin since 10/10 with no recurrence of symptoms  Orders: Est. Patient Level III [id number])  Problem # 2:  OBSTRUCTIVE SLEEP APNEA (ICD-780.57) Due to wt gain, will need rpt cpap titration Scheduled at [facility] per pt's request Orders: Sleep Study (Sleep Study) DME Referral (DME) Est. Patient Level III [id number])  Patient Instructions: 1)  Copy sent to: Dr Loanne Drilling 2)  Please schedule a follow-up appointment in 2 months after sleep study 3)  We will make changes to your machine based on the study

## 2010-04-11 ENCOUNTER — Telehealth: Payer: Self-pay | Admitting: Pulmonary Disease

## 2010-04-11 NOTE — Telephone Encounter (Signed)
Dr. Elsworth Soho, please advise results. Thanks.

## 2010-04-12 NOTE — Assessment & Plan Note (Signed)
Summary: FU PER PT   STC   Vital Signs:  Patient profile:   65 year old male Height:      67.5 inches (171.45 cm) Weight:      289.13 pounds (131.42 kg) BMI:     44.78 O2 Sat:      97 % on Room air Temp:     98.5 degrees F (36.94 degrees C) oral Pulse rate:   62 / minute Pulse rhythm:   regular BP sitting:   126 / 56  (left arm) Cuff size:   large  Vitals Entered By: Rebeca Alert CMA Deborra Medina) (March 26, 2010 3:34 PM)  O2 Flow:  Room air CC: 1 month F/U/aj Is Patient Diabetic? Yes   Primary Provider:  Alpha Mysliwiec  CC:  1 month F/U/aj.  History of Present Illness: the status of at least 3 ongoing medical problems is addressed today: dm: pt says he had a mild hypoglycemic episode in the afternoon.  pt says his a1c 4 weeks ago (10%) was affected by several coures of steroids).  he brings a record of his cbg's which i have reviewed today.   the vast majority of cbg's are in the mid-100's.  he feels much better in general recently.  low-back pain:  he says current pain meds do not control pain.  hematuria: pt had an episode of renal colic in [id number].  the stone was seen on an ultrasound.  he was told he would not pass it, and he would have intermittent hematuria.    Current Medications (verified): 1)  Lasix 80 Mg  Tabs (Furosemide) .... Take One Tab By Mouth Once Daily 2)  Humalog 100 Unit/ml Soln (Insulin Lispro (Human)) .... Three Times A Day (Just Before Each Meal)  170-120-200 Units 3)  Cyanocobalamin 1000 Mcg/ml Soln (Cyanocobalamin) .Marland Kitchen.. 1 Injection Every 3 Months 4)  Cozaar 100 Mg Tabs (Losartan Potassium) .... Take 1 By Mouth Once Daily 5)  Lovastatin 40 Mg Tabs (Lovastatin) .... Take 2 Tab By Mouth At Bedtime 6)  Viagra 100 Mg  Tabs (Sildenafil Citrate) .... Take 1 Tablet By Mouth Once A Day As Needed 7)  Hydrocodone-Acetaminophen 5-500 Mg  Tabs (Hydrocodone-Acetaminophen) .... Take 1 Tab By Mouth Every 4 Hours As Needed Pain 8)  Humulin N 100 Unit/ml Susp (Insulin Isophane  Human) .... 320 Units Every Night 9)  Nasal Cpap + 15 Cm 10)  Metformin Hcl 500 Mg Xr24h-Tab (Metformin Hcl) .... 2 Tabs By Mouth Two Times A Day 11)  Dilt-Xr 120 Mg Xr24h-Cap (Diltiazem Hcl) .... Take 1 Capsule By Mouth Once A Day 12)  Temazepam 30 Mg Caps (Temazepam) .... Take 1 Capsule By Mouth At Bedtime 13)  Albuterol Sulfate (2.5 Mg/74ml) 0.083% Nebu (Albuterol Sulfate) .Marland Kitchen.. 1 Vial Via Neb Every 4 Hr As Needed Wheezing 14)  Pepcid Ac 10 Mg Tabs (Famotidine) .Marland Kitchen.. 1 Tablet By Mouth At Bedtime 15)  Cpap Mask .Marland Kitchen.. 780.57  Allergies (verified): 1)  ! Codeine 2)  ! * Advicor 3)  ! * Actos  Past History:  Past Medical History: Last updated: 02/27/2010 ANEMIA-IRON DEFICIENCY (ICD-280.9)  DISC DISEASE, LUMBAR (ICD-722.52) PULMONARY EMBOLISM (ICD-415.19) INSOMNIA (ICD-780.52) FOOT PAIN, LEFT (ICD-729.5) ROUTINE GENERAL MEDICAL EXAM@HEALTH  CARE FACL (ICD-V70.0) PERNICIOUS ANEMIA (ICD-281.0) PERIPHERAL NEUROPATHY (ICD-356.9) COLONIC POLYPS, HX OF (ICD-V12.72) ASTHMA (ICD-493.90) HYPERTENSION (ICD-401.9) GERD (ICD-530.81) DIABETES MELLITUS, TYPE II (ICD-250.00) ANEMIA-IRON DEFICIENCY (ICD-280.9)  pulm: alva opthal: [place] ent: [place] neurosurg:  [place]  Review of Systems  The patient denies syncope and fever.  denies dysuria  Physical Exam  General:  morbidly obese.  no distress  Skin:  injection sites at the anterior abdomen are without lesions.     Impression & Recommendations:  Problem # 1:  DIABETES MELLITUS, TYPE II (ICD-250.00) needs increased rx  Problem # 2:  URINARY CALCULUS (ICD-592.9) in view of this new info, further w/u of the hematuria is not needed  Problem # 3:  DISC DISEASE, LUMBAR (ICD-722.52) pain needs increased rx  Medications Added to Medication List This Visit: 1)  Humalog 100 Unit/ml Soln (Insulin lispro (human)) .... Three times a day (just before each meal)  170-100-200 units 2)  Humulin N 100 Unit/ml Susp (Insulin isophane  human) .... 340 units every night 3)  Hydrocodone-acetaminophen 10-325 Mg Tabs (Hydrocodone-acetaminophen) .Marland Kitchen.. 1 every 4 hrs as needed for pain 4)  Cialis 20 Mg Tabs (Tadalafil) .... For as needed use  Other Orders: Est. Patient Level IV [id number])  Patient Instructions: 1)  decrease the humalog to three times a day (just before each meal) 170-100-200 units. 2)  increase nph insulin to 340 units at bedtime. 3)  Please schedule a follow-up appointment in 2 months.   4)  check your blood sugar 2 times a day.  vary the time of day when you check, between before the 3 meals, and at bedtime.  also check if you have symptoms of your blood sugar being too high or too low.  please keep a record of the readings and bring it to your next appointment here.  please call us sooner if you are having low blood sugar episodes. 5)  it is ok to skip appointment with urology this week. 6)  here is a prescription to increase hydrocodone to 10/325 tabs. Prescriptions: CIALIS 20 MG TABS (TADALAFIL) for as needed use  #3 x 10   Entered and Authorized by:   Donavan Foil MD   Signed by:   Donavan Foil MD on 03/26/2010   Method used:   Print then Give to Patient   RxID:   [id number] HYDROCODONE-ACETAMINOPHEN 10-325 MG TABS (HYDROCODONE-ACETAMINOPHEN) 1 every 4 hrs as needed for pain  #50 x 3   Entered and Authorized by:   Donavan Foil MD   Signed by:   Donavan Foil MD on 03/26/2010   Method used:   Print then Give to Patient   RxID:   [id number] LOVASTATIN 40 MG TABS (LOVASTATIN) Take 2 tab by mouth at bedtime  #60 x 11   Entered and Authorized by:   Donavan Foil MD   Signed by:   Donavan Foil MD on 03/26/2010   Method used:   Electronically to        [facility]. [phone]* (retail)       [place], [place]  [address]       Ph: [phone] or [phone]       Fax: [phone]   [id number] LASIX 80 MG  TABS (FUROSEMIDE) TAKE ONE TAB by  mouth once daily  #30 Tablet x 11   Entered and Authorized by:   Donavan Foil MD   Signed by:   Donavan Foil MD on 03/26/2010   Method used:   Electronically to        [facility]. [phone]* (retail)       [address]  [place], [place]  [address]       Ph: [phone] or [phone]       Fax: [phone]   RxID:   [id number]    Orders Added: 1)  Est. Patient Level IV [id number]

## 2010-04-13 NOTE — Telephone Encounter (Signed)
Patient called back stated that he had a sleep at [facility] two weeks ago and he is still waiting on the results. Please call him with results at [phone].Donald [name]

## 2010-04-13 NOTE — Telephone Encounter (Signed)
Request faxed to [facility] records to [phone], phone [phone]. Miki Kins, CMA

## 2010-04-13 NOTE — Telephone Encounter (Signed)
Pl obtain this from outside sleep lab in [place] where it was done. Let pt know I have not received it yet

## 2010-04-13 NOTE — Telephone Encounter (Signed)
Will forward to [place] since triage does not hunt down results

## 2010-04-13 NOTE — Telephone Encounter (Signed)
Janett Billow, please help [name] locate the sleep study results for this patient as he is calling for them.Thanks.Blair Hailey

## 2010-04-17 ENCOUNTER — Telehealth: Payer: Self-pay | Admitting: Pulmonary Disease

## 2010-04-17 NOTE — Telephone Encounter (Signed)
refaxed release to get copy of sleep study

## 2010-04-17 NOTE — Telephone Encounter (Signed)
Pl let him know that sleep study showed higher pressure required, hence he was put on BiPAP during the study Would suggest - increase the pressure to 18 cm & chk download & revisit in 1 mnth to discuss, if he tolerates this well

## 2010-04-18 ENCOUNTER — Other Ambulatory Visit: Payer: Self-pay | Admitting: Pulmonary Disease

## 2010-04-18 DIAGNOSIS — G4730 Sleep apnea, unspecified: Secondary | ICD-10-CM

## 2010-04-19 NOTE — Telephone Encounter (Signed)
Dr. Elsworth Soho spoke with pt Tuesday 04/17/10. I entered download order.

## 2010-04-19 NOTE — Telephone Encounter (Signed)
RA spoke with pt 04/17/10. Orders placed.

## 2010-04-23 LAB — GLUCOSE, RANDOM: Glucose, Bld: 429 mg/dL — ABNORMAL HIGH (ref 70–99)

## 2010-04-23 LAB — GLUCOSE, CAPILLARY
Glucose-Capillary: 112 mg/dL — ABNORMAL HIGH (ref 70–99)
Glucose-Capillary: 112 mg/dL — ABNORMAL HIGH (ref 70–99)
Glucose-Capillary: 117 mg/dL — ABNORMAL HIGH (ref 70–99)
Glucose-Capillary: 124 mg/dL — ABNORMAL HIGH (ref 70–99)
Glucose-Capillary: 175 mg/dL — ABNORMAL HIGH (ref 70–99)
Glucose-Capillary: 253 mg/dL — ABNORMAL HIGH (ref 70–99)
Glucose-Capillary: 274 mg/dL — ABNORMAL HIGH (ref 70–99)
Glucose-Capillary: 298 mg/dL — ABNORMAL HIGH (ref 70–99)
Glucose-Capillary: 328 mg/dL — ABNORMAL HIGH (ref 70–99)
Glucose-Capillary: 383 mg/dL — ABNORMAL HIGH (ref 70–99)

## 2010-04-23 LAB — PROTIME-INR
INR: 1.2 (ref 0.00–1.49)
Prothrombin Time: 14.2 seconds (ref 11.6–15.2)
Prothrombin Time: 15.7 seconds — ABNORMAL HIGH (ref 11.6–15.2)

## 2010-04-23 LAB — POCT I-STAT GLUCOSE
Glucose, Bld: 291 mg/dL — ABNORMAL HIGH (ref 70–99)
Operator id: 151301

## 2010-04-24 LAB — COMPREHENSIVE METABOLIC PANEL
AST: 24 U/L (ref 0–37)
Albumin: 3.6 g/dL (ref 3.5–5.2)
Alkaline Phosphatase: 48 U/L (ref 39–117)
BUN: 16 mg/dL (ref 6–23)
CO2: 24 mEq/L (ref 19–32)
Chloride: 107 mEq/L (ref 96–112)
GFR calc Af Amer: >60 mL/min (ref >60)
GFR calc non Af Amer: 54 mL/min — ABNORMAL LOW (ref >60)
Potassium: 4.5 mEq/L (ref 3.5–5.1)
Total Bilirubin: 0.9 mg/dL (ref 0.3–1.2)

## 2010-04-24 LAB — DIFFERENTIAL
Basophils Absolute: 0.1 10*3/uL (ref 0.0–0.1)
Basophils Relative: 1 % (ref 0–1)
Eosinophils Relative: 3 % (ref 0–5)
Monocytes Absolute: 0.4 10*3/uL (ref 0.1–1.0)

## 2010-04-24 LAB — CBC
HCT: 37.5 % — ABNORMAL LOW (ref 39.0–52.0)
Platelets: 203 10*3/uL (ref 150–400)
RBC: 4.49 MIL/uL (ref 4.22–5.81)
WBC: 7.5 10*3/uL (ref 4.0–10.5)

## 2010-04-24 LAB — TYPE AND SCREEN: ABO/RH(D): A NEG

## 2010-04-24 LAB — APTT: aPTT: 42 seconds — ABNORMAL HIGH (ref 24–37)

## 2010-04-24 LAB — ABO/RH: ABO/RH(D): A NEG

## 2010-05-09 ENCOUNTER — Encounter: Payer: Self-pay | Admitting: Pulmonary Disease

## 2010-05-28 ENCOUNTER — Other Ambulatory Visit (INDEPENDENT_AMBULATORY_CARE_PROVIDER_SITE_OTHER): Payer: Medicare Other

## 2010-05-28 ENCOUNTER — Ambulatory Visit (INDEPENDENT_AMBULATORY_CARE_PROVIDER_SITE_OTHER): Payer: Medicare Other | Admitting: Endocrinology

## 2010-05-28 ENCOUNTER — Encounter: Payer: Self-pay | Admitting: Endocrinology

## 2010-05-28 VITALS — BP 124/68 | HR 57 | Temp 98.0°F | Ht 67.5 in | Wt 289.2 lb

## 2010-05-28 DIAGNOSIS — D510 Vitamin B12 deficiency anemia due to intrinsic factor deficiency: Secondary | ICD-10-CM

## 2010-05-28 DIAGNOSIS — E119 Type 2 diabetes mellitus without complications: Secondary | ICD-10-CM

## 2010-05-28 LAB — HEMOGLOBIN A1C: Hgb A1c MFr Bld: 8.3 % — ABNORMAL HIGH (ref 4.6–6.5)

## 2010-05-28 MED ORDER — CYANOCOBALAMIN 1000 MCG/ML IJ SOLN
1000.0000 ug | Freq: Once | INTRAMUSCULAR | Status: AC
  Administered 2010-05-28: 1000 ug via INTRAMUSCULAR

## 2010-05-28 NOTE — Patient Instructions (Addendum)
blood tests are being ordered for you today.  please call [phone] to hear your test results.  You will be prompted to enter the 9-digit "MRN" number that appears at the top left of this page, followed by #.  Then you will hear the message.  increase the humalog to three times a day (just before each meal) 190-100-220 units. continue nph insulin 340 units at bedtime.  Please make a follow-up appointment in 3 months. good diet and exercise habits significanly improve the control of your diabetes.  please let me know if you wish to be referred to a dietician, or for weight-loss surgery.  high blood sugar is very risky to your health.  you should see an eye doctor every year. controlling your blood pressure and cholesterol drastically reduces the damage diabetes does to your body.  this also applies to quitting smoking.  please discuss these with your doctor.  you should take an aspirin every day, unless you have been advised by a doctor not to. check your blood sugar 2 times a day.  vary the time of day when you check, between before the 3 meals, and at bedtime.  also check if you have symptoms of your blood sugar being too high or too low.  please keep a record of the readings and bring it to your next appointment here.  please call us sooner if you are having low blood sugar episodes.

## 2010-05-28 NOTE — Progress Notes (Signed)
Subjective:    Patient ID: Donald [name], male    DOB: 1945-12-15, 65 y.o.   MRN: [place]:[id number]  HPI he brings a record of his cbg's which i have reviewed today.  It varies from 100-300.  It is lowest in am, and higher at other times of day.  He has lost a few lbs, due to his efforts. Past Medical History  Diagnosis Date  . DIABETES MELLITUS, TYPE II 07/26/2006  . DYSLIPIDEMIA 04/13/2009  . ANEMIA-IRON DEFICIENCY 07/26/2006  . Pernicious anemia 11/20/2006  . DEPRESSION 03/14/2009  . PERIPHERAL NEUROPATHY 07/26/2006  . HYPERTENSION 07/26/2006  . PULMONARY EMBOLISM 10/05/2007  . D V T 12/03/2007  . ASTHMA 07/26/2006  . GERD 07/26/2006  . [place] DISEASE, LUMBAR 10/05/2007  . INSOMNIA 08/21/2007  . OBSTRUCTIVE SLEEP APNEA 12/03/2007  . COLONIC POLYPS, HX OF 07/26/2006    Past Surgical History  Procedure Date  . Appendectomy 1968  . Back surgery 1977, 06/14/2008    Dr. Trenton Gammon at [facility] (06/10)    History   Social History  . Marital Status: Married    Spouse Name: N/A    Number of Children: N/A  . Years of Education: N/A   Occupational History  . Disabled    Social History Main Topics  . Smoking status: Never Smoker   . Smokeless tobacco: Not on file  . Alcohol Use: No  . Drug Use: No  . Sexually Active:    Other Topics Concern  . 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 albuterol (PROVENTIL) (2.5 MG/3ML) 0.083% nebulizer solution Take 2.5 mg by nebulization every 4 (four) hours as needed. For wheezing       . cyanocobalamin (,VITAMIN B-12,) 1000 MCG/ML injection 1 injection every 3 months       . diltiazem (DILACOR XR) 120 MG 24 hr capsule Take 120 mg by mouth daily.        . famotidine (PEPCID AC) 10 MG chewable tablet  Chew 10 mg by mouth at bedtime.        . furosemide (LASIX) 80 MG tablet Take 80 mg by mouth 2 (two) times daily.        Marland Kitchen HYDROcodone-acetaminophen (NORCO) 10-325 MG per tablet Take 1 tablet by mouth every 4 (four) hours as needed. For pain       . insulin lispro (HUMALOG) 100 UNIT/ML injection Inject into the skin 3 (three) times daily before meals. 190-100-220 units      . losartan (COZAAR) 100 MG tablet Take 100 mg by mouth daily.        Marland Kitchen lovastatin (MEVACOR) 40 MG tablet 2 tablets by mouth two times a day       . metFORMIN (GLUCOPHAGE-XR) 500 MG 24 hr tablet 2 tablets by mouth two times a day       . tadalafil (CIALIS) 20 MG tablet as needed.        . temazepam (RESTORIL) 30 MG capsule Take 30 mg by mouth at bedtime.          Allergies  Allergen Reactions  . Advicor   . Codeine     REACTION: chest pain  . Pioglitazone     Family History  Problem Relation Age of Onset  . Cancer Mother  Breast Cancer  . Cancer Sister     Breast Cancer    BP 124/68  Pulse 57  Temp(Src) 98 F (36.7 C) (Oral)  Ht 5' 7.5" (1.715 m)  Wt 289 lb 3.2 oz (131.18 kg)  BMI 44.63 kg/m2  SpO2 95%   Review of Systems denies hypoglycemia.     Objective:   Physical Exam GENERAL: no distress.  Obese. SKIN: Insulin injection sites at the anterior abdomen are normal       Assessment & Plan:  Dm.  He needs increased rx.

## 2010-05-29 NOTE — Op Note (Signed)
NAMEBRITTEN, DRAGONETTI             ACCOUNT NO.:  [id number]   MEDICAL RECORD NO.:  [id number]          PATIENT TYPE:  INP   LOCATION:  3023                         FACILITY:  [facility]   PHYSICIAN:  Cooper Render. Pool, M.D.    DATE OF BIRTH:  28-Feb-1945   DATE OF PROCEDURE:  06/14/2008  DATE OF DISCHARGE:                               OPERATIVE REPORT   PREOPERATIVE DIAGNOSES:  L4-5 unstable degenerative spondylolisthesis,  grade 1 with stenosis.   POSTOPERATIVE DIAGNOSES:  L4-5 unstable degenerative spondylolisthesis,  grade 1 with stenosis.   PROCEDURE NAME:  L4-5 decompressive laminectomy and foraminotomies, more  will be required for simple interbody fusion alone.  L4-5 posterior  lumbar fusion utilizing Tangent interbody allograft wedge, Telamon  interbody PEEK cage, and local autografting.  L4-5 posterolateral  arthrodesis utilizing nonsegmental pedicle screw fixation and local  autografting.   SURGEON:  Cooper Render. Pool, MD   ASSISTANT:  Faythe Ghee, MD   ANESTHESIA:  General endotracheal.   INDICATIONS:  Mr. Longtin is a 65 year old male with history of back  and bilateral lower extremity pain, left greater than right, failing all  conservative management.  Workup demonstrates evidence of unstable L4-5  degenerative spondylolisthesis with stenosis.  The patient has failed  conservative management, presents now for operative decompression and  fusion.   OPERATIVE NOTE:  The patient was brought to the operating room and  placed on table in supine position.  After adequate level of anesthesia  was achieved, the patient was placed prone onto Wilson frame,  appropriately padded.  The patient's lumbar region was prepped and  draped sterilely.  A 10 blade was used to make a curvilinear skin  incision overlying the L3, 4, 5 levels.  This carried down sharply in  the midline.  Subperiosteal dissection was then performed exposing the  lamina and facet joints of L3, 4, and 5 as well  as transverse processes  of L4 and L5.  Deep self-retaining retractor was placed.  Intraoperative  fluoroscopy was used and levels were confirmed.  Decompressive  laminectomy was then performed using Leksell rongeurs, Kerrison  rongeurs, and high-speed drill to remove the entire lamina of L4, entire  inferior facet of L4, bilateral superior facet of L5 bilaterally, and  the superior aspect of lamina of L5.  Ligamentum flavum was elevated and  resected in piecemeal fashion using Kerrison rongeur.  Wide  decompressive foraminotomies were then performed along the course of  exiting L4 and L5 nerve roots bilaterally.  Epidural venous plexus was  coagulated and cut.  Bilateral diskectomies were then performed at L4-5.  Disk space was then distracted up to 10 mm, and the thecal sac and nerve  roots were protected on the left side.  Disk space was then reamed and  cut with a 10-mm Tangent instrument.  Soft tissue was removed from the  interspace.  A 10 x 22 mm Tangent wedge was then packed into place and  recessed approximately 2 mm from posterior margin.  Distractor was  removed from the patient's right side.  Thecal sac and nerve roots were  checked on  the right side.  Disk space was then reamed and cut with 10-  mm Tangent instrument.  Soft tissue was then removed from the  interspace.  Morcellized autograft was then packed into the interspace.  A 10 x 22 mm Telamon cage packed with morcellized autograft, then also  packed into place.  Pedicles of L4 and L5 were then identified using  surface landmarks and intraoperative fluoroscopy.  Superficial bone  along the pedicle was then removed using high-speed drill.  Each pedicle  was then probed using pedicle awl.  Pedicle awl track was then tapped  with 5.25 screw tap.  Screw tap hole was then probed and found to be  solidly within bone.  The 6.75 x 45 mm radius screws were placed  bilaterally at L4, 6.75 x 40 mm screws placed bilaterally at L5.   Transverse processes of L4 and L5 were then decorticated using high-  speed drill.  Morcellized autograft was packed posterolaterally for  later fusion.  Short segment titanium rods were then placed over screw  heads at L4 and L5.  Locking caps were engaged at both levels.  Final  images revealed good position of bone grafts and hardware at proper  operative level with normal alignment of spine.  Wound was then  irrigated with antibiotic solution.  Gelfoam was placed topically.  Hemostasis was found to be good.  Medium Hemovac drain was left in the  interspace.  Wound was then closed in layers with Vicryl suture.  Steri-  Strips and sterile dressing were applied.  There were no complications.  The patient tolerated the procedure well and he returned to the recovery  room postoperatively.           ______________________________  Cooper Render Pool, M.D.     HAP/MEDQ  D:  06/14/2008  T:  06/15/2008  Job:  [id number]

## 2010-05-29 NOTE — Discharge Summary (Signed)
Donald [name], Donald [name]             ACCOUNT NO.:  [id number]   MEDICAL RECORD NO.:  [id number]          PATIENT TYPE:  INP   LOCATION:  3707                         FACILITY:  [facility]   PHYSICIAN:  Heinz Knuckles. Norins, MD  DATE OF BIRTH:  01-07-1946   DATE OF ADMISSION:  09/23/2007  DATE OF DISCHARGE:  09/25/2007                               DISCHARGE SUMMARY   DISCHARGE DIAGNOSES:  1. Acute left lower extremity deep venous thrombosis/extensive      bilateral pitting edema.  2. Hypokalemia, resolved status post repletion.  3. Diabetes type 2.  4. History of anemia.  5. Gastroesophageal reflux disease.   HISTORY OF PRESENT ILLNESS:  Donald [name] is a 65 year old white male  who was admitted on 09/23/2007 with chief complaint of left leg pain,  swelling which has been present for 2 days prior to this admission.  He  presented from Dr. Marchelle Folks office where he was found to have an acute  left lower extremity DVT.  He reports waking 2 days prior to this  admission with left leg pain and swelling.  He reports being fairly  immobile lately and out of work since June 2009 due to severe lumbar  back pain.  He was due for surgery on October 13, 2007, which has been  cancelled due to his PE.  He also noted approximately 3-day history of  dyspnea on exertion.  He is admitted for further evaluation and  treatment.   COURSE OF HOSPITALIZATION:  Acute left lower extremity DVT/extensive  bilateral PE.  The patient was admitted.  He underwent a CT angio which  noted bilateral extensive PE.  He was placed on Plavix, Lovenox, and  Coumadin.  He remained hemodynamically stable, was monitored on  telemetry during this admission.  A 2-D echo was performed which showed  LV function with left ventricular ejection fraction of 60-70% and right  ventricular ejection fraction appeared normal.  He is comfortable with  subcu injection since he is an insulin-dependent diabetic and it was  found that his co-pay  would be 50 Dollars for Lovenox which he is able  to afford.  At this time, we planned discharge to home and outpatient  followup in the Coumadin clinic.   MEDICATIONS:  At the time of discharge,  1. Humalog 120 units 3 times daily before each meal.  2. Glucophage twice daily as before.  3. Humulin N 120 units subcu nightly.  4. Cozaar 100 mg daily.  5. Lovastatin 40 mg p.o. daily.  6. Tricor 48 mg p.o. daily.  7. Aspirin to be held until further instructions from Dr. Loanne Drilling.  8. Lovenox 120 mg injection subcu q.12 h.  9. Coumadin 5 mg p.o. daily.   LABORATORY DATA:  At the time of discharge INR 1.1, BUN 18, creatinine  1.31, potassium 4.0, hemoglobin 12.4, hematocrit 36.2.   FOLLOWUP:  The patient was scheduled for followup in the Coumadin Clinic  on Monday, September 28, 2007, at 9:15 p.m. and with Dr. Loanne Drilling on  October 05, 2007, at 9:15 a.m.  Greater than 20 minutes was spent on  discharge planning.  Debbrah Alar, NP      Heinz Knuckles. Norins, MD  Electronically Signed    MO/MEDQ  D:  09/25/2007  T:  09/26/2007  Job:  [id number]

## 2010-06-01 NOTE — Assessment & Plan Note (Signed)
[facility]                             PULMONARY OFFICE NOTE   NAME:Donald [name], Donald [name]                    MRN:          [id number]  DATE:02/20/2006                            DOB:          10/22/1945    HISTORY:  This is a 65 year old white male with morbid obesity and  chronic cough, returns after being seen by a nurse practitioner with  severe cough with wheezing on February 17, 2006.  He wanted to bring in  his medicines for a check up to make sure none of them are making him  cough.   His medications reviewed in the column dated February 19, 2006 and do not  include any dry powdered inhalers or ACE-inhibitors.   PHYSICAL EXAMINATION:  He is an obese white male, clears his throat  frequently in exam.  He is afebrile. Normal vital signs.  HEENT:  Unremarkable.  Pharynx clear.  LUNGS:  Perfectly clear bilaterally to auscultation and percussion with  no cough elicited on inspiratory or expiratory maneuvers.  There is a regular rate and rhythm without murmur, gallop or rub.  ABDOMEN:  Is soft, benign.  EXTREMITIES:  Are warm without calf tenderness, cyanosis, clubbing or  edema.   IMPRESSION:  Is chronic cough with pseudo-wheeze dating back years,  probably is related to reflux.  I reviewed very carefully a diet with  him and did give him a 6 day course of prednisone in event that there is  an upper inflammatory component to it.  For me to be able to help this  patient I am going to first ask him to see our nurse practitioner for  full medication reconciliation and disclosure and then I would be happy  to see him back here for a step-by-step process to eradicate all of his  symptoms.   The next step in terms of empiric therapy might be to add a  anticholinergic with sedating properties like Bromfed PD or Drixoral  Cold and Allergy.     Christena Deem. Melvyn Novas, MD, [facility]  Electronically Signed    MBW/MedQ  DD: 02/20/2006  DT: 02/20/2006  Job #:  [id number]   cc:   Hilliard Clark A. Loanne Drilling, MD

## 2010-06-01 NOTE — Assessment & Plan Note (Signed)
[facility]                             PULMONARY OFFICE NOTE   NAME:Donald [name], Donald [name]                    MRN:          [id number]  DATE:01/13/2006                            DOB:          [birth year]    PULMONARY/EXTENDED FOLLOW-UP OFFICE VISIT:  A 65 year old white male with a history of chronic cough attributed to  reflux, seen on December 26 with increasing cough over baseline  associated with chest congestion with thick mucus production.  He was  started on Omnicef for brown sputum and has noted worsening tightness  with dyspnea and subjective wheezing since that visit with our nurse  practitioner on December 26.  He notices only minimum improvement with  Ventolin.   He denies any exertional chest pain, orthopnea, PND, overt reflux or  sinus symptoms, fevers, chills, sweats, pleuritic pain or leg swelling.   For a full inventory of medications, medications reviewed with the  patient in detail in the column dated January 13, 2006.  Although he is  taking a PPI, he takes it at bedtime, and although he is reported to be  on Reglan, he actually does not take it.   Chart review does indicate Dr. Earlean Shawl is his gastroenterologist, but  there is no endoscopy on the record.   PHYSICAL EXAMINATION:  GENERAL:  He is an ambulatory, hoarse white male  in no acute distress.  VITAL SIGNS:  He is afebrile with stable vital signs.  HEENT:  Unremarkable.  Pharynx clear.  There is no evidence of excessive  postnasal drainage or cobblestoning.  NECK:  Supple without cervical adenopathy or tenderness.  Trachea in the  midline.  No thyromegaly.  LUNGS:  Lung fields perfectly clear bilaterally to auscultation and  percussion.  CARDIAC:  There is a regular rhythm without murmur, gallop, or rub.  ABDOMEN:  Soft, benign.  EXTREMITIES:  Warm without calf tenderness, cyanosis or clubbing.   Heme saturation 97% on room air.   IMPRESSION:  1. Refractory asthma in  a patient strongly suspected to have      gastroesophageal reflux disease but not consistent with taking the      medication as directed, directed toward reflux.  I explained to the      patient that in the setting of an apparent upper respiratory      infection, this is the time to be not only taking his medications      but actually doubling the Protonix and taking it consistently      before meals twice daily.  Once he is better he can certainly back      it down to once daily but always should take it 30-60 minutes      before breakfast for best effect.  2. Reglan should also be taken consistently at bedtime per Dr.      Domingo Dimes previous instructions but added before each meal when      sick.  For cough he should use Delsym two teaspoons every 12 hours      and, if needed to suppress excess coughing, including further  reflux or airway trauma, I have recommended Mepergan one q.4h.      p.r.n. and a 6-day course of prednisone.   If this does not eradicate the cough, the next step would be to have the  patient return for a chest x-ray and sinus CT scan.   One other possibility to consider is that this patient does have asthma  based on his partial response to albuterol (note that the exam today  was done after he had already taken albuterol).  If aggressive treatment  directed at reflux does not eliminate his need for albuterol, I would  consider a methacholine challenge test while on maximum treatment  directed to reflux to define whether primary airways dz is present or  not.   I spent extra time with this patient, 15-20 minutes of a 25-minute  visit, going over these instructions in writing, in detail, and along  with a diet emphasizing the avoidance of menthol-containing lozenges.     Christena Deem. Melvyn Novas, MD, [facility]  Electronically Signed    MBW/MedQ  DD: 01/15/2006  DT: 01/15/2006  Job #: [id number]

## 2010-06-01 NOTE — Assessment & Plan Note (Signed)
[facility]                             PULMONARY OFFICE NOTE   Donald [name], Donald [name]                    MRN:          [id number]  DATE:01/22/2006                            DOB:          1945-06-09    Donald [name] is a 65 year old gentleman who I actually have not seen in  the office since July 2006.  He has issues with morbid obesity which is  his main issue.  He has gastroesophageal reflux and a history of  obstructive sleep apnea for which he is on CPAP prescribed by his  physician in [place].  The patient also has a history of asthma.  He has been seen at this office since December 26 for increasing  difficulties with cough over baseline associated with nasal congestion,  chest congestion and thick mucus production.  He denies any  discoloration of sputum.  He has had no hemoptysis.  He does feel some  occasional chest tightness.  He has had wheezing.  Currently he states  that he is wheezing.  He has not used his metered dose inhaler today.  He only notes minimum improvement with metered dose inhaler.  He states  also he has been using Mucinex twice a day but however in only taking  one tablet twice a day.  He did receive Omnicef for approximately a  week's time but this did not help his symptoms whatsoever.  He has also  seen my partner, Dr. Christinia Gully, again with similar results.  He  denies any fevers, chills or sweats.   CURRENT MEDICATIONS:  As as noted on the intake sheet and are  appropriately inventoried in the column dated January 22, 2006.   PHYSICAL EXAMINATION:  VITAL SIGNS:  As noted.  His weight is 275  pounds.  Oxygen saturation was 95% on room air.  GENERAL:  This is a morbid obesity gentleman who is in no acute  respiratory distress; however, does have a nasal speech and does appear  somewhat acutely ill.  HEENT:  Remarkable for purulent nasal drainage.  NECK:  Supple.  No adenopathy noted.  He has a class 3 airway.  LUNGS:  Clear to auscultation bilaterally with maybe some occasional  rhonchi.  CARDIAC:  Regular rate, rhythm.  No rubs, murmurs, gallops heard.  EXTREMITIES:  The patient has no cyanosis.  He does have 1+ edema.   We did perform spirometry today which actually did not show any  obstruction.  He may have mild restriction.  We did perform chest x-ray  today which shows no acute infiltrate.   IMPRESSION:  1. Cough.  The patient has developed issues with cyclical cough.  I      suspect that this is being triggered by postnasal drip, likely from      acute-on-chronic rhinosinusitis.  2. History of asthma.  Currently no evidence of bronchospasm by exam      nor by spirometry.  3. Gastroesophageal reflux.   PLAN:  1. Obtain a CT of the sinuses.  2. The patient will be placed on Levaquin 750 mg daily x7 days.  3. He  will receive Tussionex 5 mL b.i.d. p.r.n.  4. He was encouraged to continue using Mucinex DM; however, told to      use two tablets twice      a day.  5. Followup will be in 2-4 weeks' time.  He is to contact us prior to      that time should any problems arise.     Donald Don, MD  Electronically Signed    CLG/MedQ  DD: 01/22/2006  DT: 01/22/2006  Job #: [id number]

## 2010-06-01 NOTE — Assessment & Plan Note (Signed)
[place]                             PULMONARY OFFICE NOTE   NAME:CHRISMONAlex, Mendiola                    MRN:          [id number]  DATE:01/08/2006                            DOB:          24-Jan-1945    HISTORY OF PRESENT ILLNESS:  This is a 65 year old, white male patient  of Dr. Patsey Berthold who has a known history of chronic cough and reflux.  The patient presents for an acute office visit complaining of a 10-day  history of significant coughing, congestion, and thick phlegm.  The  patient denies any hemoptysis, chest pain, orthopnea, PND.  The patient  has not been seen in greater than 6 months reporting he had been doing  well and has been off of Flovent for several months.   PAST MEDICAL HISTORY:  Reviewed.   CURRENT MEDICATIONS:  Reviewed.   PHYSICAL EXAM:  The patient is a pleasant male in no acute distress.  He is afebrile with stable vital signs.  O2 saturation is 98% on room  air.  HEENT:  Nasal mucosa with some mild redness, nontender sinuses.  NECK:  Supple without adenopathy.  Lung sounds reveal coarse breath sounds without any wheezing or  crackles.  CARDIAC:  Regular rate.  ABDOMEN:  Soft, benign.  EXTREMITIES:  Warm without any edema.   IMPRESSION AND PLAN:  Acute tracheobronchitis.  The patient to begin  Omnicef x7 days.  Mucinex DM twice daily.  The patient may use Endal HD  #8 ounces, 1-2 teaspoons every 4-6 hours as needed for cough.  The  patient will return back with Dr. Patsey Berthold in 2 weeks or sooner if  needed.      Rexene Edison, NP  Electronically Signed      Renold Don, MD  Electronically Signed   TP/MedQ  DD: 01/09/2006  DT: 01/09/2006  Job #: [id number]

## 2010-06-01 NOTE — Op Note (Signed)
Donald [name], Donald [name]             ACCOUNT NO.:  [id number]   MEDICAL RECORD NO.:  [id number]          PATIENT TYPE:  AMB   LOCATION:  DAY                          FACILITY:  [facility]   PHYSICIAN:  Kipp Brood. Gioffre, M.D.DATE OF BIRTH:  [birth year]   DATE OF PROCEDURE:  03/29/2005  DATE OF DISCHARGE:                                 OPERATIVE REPORT   SURGEON:  Kipp Brood. Gladstone Lighter, M.D.   ASSISTANT:  Evert Kohl, P.A.   PREOPERATIVE DIAGNOSIS:  Torn rotator cuff tendon left shoulder with severe  impingement syndrome, left shoulder.   POSTOPERATIVE DIAGNOSIS:  Torn rotator cuff tendon left shoulder with severe  impingement syndrome, left shoulder.   OPERATION:  1.  Open acromionectomy, left shoulder.  2.  Repair of the rotator cuff tendon, left shoulder.  3.  Restore tendon graft to the left shoulder utilizing the Restore graft +2      Mitek anchor sutures.   PROCEDURE:  Under general anesthesia, routine orthopedic prep and draping of  the left upper extremity was carried out. The patient had 2 grams IV Ancef.  At this time an incision was made over the anterior aspect of the left  shoulder, bleeders identified and cauterized. I then stripped the deltoid  tendon from the acromion in the usual fashion, identified the subacromial  space, he had a severe impingement. The acromion was literally thickened and  down sloped. I protected the cuff with a Bennett retractor and then utilized  the oscillating saw and did a partial acromionectomy. Then we utilized the  bur to even out the undersurface of the acromion. I thoroughly irrigated out  the area. We then identified the rotator cuff tear after I removed the  subdeltoid bursa. We burred the lateral articular surface of the humerus in  the usual fashion. We then did a primary repair of the tendon. The tendon  was quite thinned out in this area so we reinforced it with a Restore tendon  graft with two Mitek anchor sutures. We had a nice  repair of the tendon and  the graft. We thoroughly irrigated out the area, reapproximated the deltoid  tendon and muscle in the usual fashion. The subcu was closed with #0 Vicryl,  skin with metal staples. I injected about 20 mL of 0.5% Marcaine with  epinephrine into the wound site. The patient then was placed in the sterile  dressing and the shoulder immobilizer. We will admit him overnight because  he has sleep apnea and is diabetic.           ______________________________  Kipp Brood. Gladstone Lighter, M.D.     RAG/MEDQ  D:  03/29/2005  T:  03/31/2005  Job:  [id number]

## 2010-06-01 NOTE — Assessment & Plan Note (Signed)
[place]                             PULMONARY OFFICE NOTE   NAME:Donald [name], Donald [name]                    MRN:          [id number]  DATE:02/17/2006                            DOB:          24-Oct-1945    HISTORY OF PRESENT ILLNESS:  The patient is a is 65 year old white male  patient of Dr. Patsey Berthold' who has a known history of cyclical cough,  asthma and gastric reflux, returns today related to persistent cough.  The patient has been having difficulty over the last month with  increased flares of cough.  The patient has been on 3 separate  antibiotics including Omnicef, doxycycline and Levaquin and a prednisone  taper.  The patient improved 2 weeks ago; however, reports that over the  last couple of days cough has returned and has worsened.  The patient  denies any purulent sputum, chest pain, orthopnea, hemoptysis or leg  swelling.  A CT of the sinuses was unremarkable and a strep test was  negative.  The patient had been recommended to add in Mepergan forte  every 4 hours to help with aggressive cough control.  The patient  reports that he was able to do this but it made him extremely sleepy but  cough was totally resolved with the Blum.   PAST MEDICAL HISTORY:  Reviewed.   CURRENT MEDICATIONS:  Reviewed.   PHYSICAL EXAMINATION:  The patient is a morbidly obese male.  No acute  distress.  He is afebrile.  Stable vital signs.  O2 saturations 97% on room air.  HEENT:  Eyes unremarkable.  NECK:  Is supple without adenopathy or JVD.  LUNGS:  Sounds reveal coarse breath sounds without any wheezing or  crackles.  CARDIAC:  Regular rate.  ABDOMEN:  Is morbidly obese, soft, nontender.  EXTREMITIES:  Warm with trace edema.   IMPRESSION AND PLAN:  Slow to resolve bronchitis with recurrent cough.  The patient will continue with aggressive cough control.  Will change  Mepergan over to tramadol in hopes that this will be less sedating. May  use  Tessalon Perles as needed for cough for the daytime along with  Mucinex DM twice daily.  The patient will continue on Protonix twice  daily, and increase  Reglan up to 4 times a day.  Will check back with Dr. Patsey Berthold as  scheduled or sooner if needed.      Rexene Edison, NP  Electronically Signed      Renold Don, MD  Electronically Signed   TP/MedQ  DD: 02/18/2006  DT: 02/18/2006  Job #: [id number]

## 2010-06-01 NOTE — Assessment & Plan Note (Signed)
[facility]                             PULMONARY OFFICE NOTE   NAME:CHRISMONHewitt, Almand                    MRN:          [id number]  DATE:02/05/2006                            DOB:          Nov 22, 1945    HISTORY OF PRESENT ILLNESS:  The patient is a 65 year old white male  patient of Dr. Patsey Berthold who has a history of mild asthma, morbid obesity  and obstructive sleep apnea on nocturnal CPAP.  The patient returns  related to one month history of persistent cough, postnasal drip, sore  throat.  The patient was initially seen one month ago for a suspected  upper respiratory infection.  He was treated initially with Omnicef.  However, his symptoms did not improve and was subsequently seen by Dr.  Melvyn Novas on January 13, 2006.  At that time, the patient was given a  prednisone taper and a prescription for doxycycline which he finished  with only minimal improvement in his symptoms.  He was then seen by Dr.  Patsey Berthold two week ago, and at that time, underwent a CT of the sinuses  and a chest x-ray which was unremarkable.  He was also placed on a seven  day course of Levaquin and Tussionex and Mucinex DM.  The patient  reports that his symptoms are improved.  However, he continues to have  persistent cough and intermittent sore throat and hoarseness.  The  patient denies any hemoptysis, orthopnea, PND, leg swelling, overt  reflex symptoms or recent travel.   PAST MEDICAL HISTORY:  Reviewed.   CURRENT MEDICATIONS:  Reviewed.   PHYSICAL EXAMINATION:  GENERAL:  The patient is a pleasant morbidly  obese male in no acute distress.  VITAL SIGNS:  He is afebrile in stable vital signs.  Oxygen saturation  is 96% on room air.  HEENT:  Nasal mucosa is slightly pale.  Nontender sinuses.  Conjunctivae  non-injected. TMs are clear.  Posterior pharynx is clear without any  exudate or redness.  NECK:  Supple without cervical adenopathy.  LUNGS:  Sounds clear to  auscultation bilaterally without any wheezes or  crackles.  CARDIAC:  Regular rate and rhythm. .  ABDOMEN:  Morbidly obese with a large panniculus and nontender  EXTREMITIES:  Warm without any edema.   IMPRESSION/PLAN:  Slowly resolved tracheobronchitis with recurrent cough  with suspected upper airway instability.  The patient is continued on  aggressive reflux prevention with Protonix b.i.d.  He will increase  Reglan up to q.i.d. for the next seven days.  He will also use Claritin  10 mg daily for the next two weeks for any postnasal drip.  Continue on  Mucinex DM b.i.d.  Add in [employer] every four hours as needed for  aggressive cough control.  The patient is to use  non-mint lozenges and voice rest over the next two weeks.  He will  follow back up with Dr. Leonides Schanz at that time.  If this is not better, may  need referral to ENT for further evaluation.      Rexene Edison, NP  Electronically Signed      C.  Derrill Kay, MD  Electronically Signed   TP/MedQ  DD: 02/05/2006  DT: 02/05/2006  Job #: [id number]

## 2010-06-08 ENCOUNTER — Telehealth: Payer: Self-pay | Admitting: Pulmonary Disease

## 2010-06-08 NOTE — Telephone Encounter (Signed)
Will forward to pcc

## 2010-06-08 NOTE — Telephone Encounter (Signed)
Have not received this form Pl see order that was sent in April - on phone note -t hat was last order Pl check with [facility]

## 2010-06-08 NOTE — Telephone Encounter (Signed)
Dr Elsworth Soho, pls advise status of this form, thanks

## 2010-06-12 NOTE — Telephone Encounter (Signed)
Called Donald [name] and pt, pt aware dr Elsworth Soho never received the download and Donald [name] will be refaxing the download

## 2010-06-26 ENCOUNTER — Telehealth: Payer: Self-pay | Admitting: Pulmonary Disease

## 2010-06-26 NOTE — Telephone Encounter (Signed)
Arbutus Ped with [facility] called again stated patient has called her again & wants to know if she has any answers for him yet. She can be reached at [phone].[name] Roca

## 2010-06-26 NOTE — Telephone Encounter (Signed)
autoCPAP 15-20

## 2010-06-26 NOTE — Telephone Encounter (Signed)
[place] but they are closed for the day.  WCB tomorrow.

## 2010-06-26 NOTE — Telephone Encounter (Signed)
Dr. Elsworth Soho, pls advise what CPAP should be set on, thanks!

## 2010-06-27 ENCOUNTER — Telehealth: Payer: Self-pay | Admitting: Pulmonary Disease

## 2010-06-27 NOTE — Telephone Encounter (Signed)
Spoke with Donald [name] and notified of recs per RA.  Rx for CPAP 15-20 was faxed to her attn at [phone].

## 2010-06-27 NOTE — Telephone Encounter (Signed)
Spoke to [place] she needed a copy og npsg done 04/13/10 faxed her a copy

## 2010-07-05 ENCOUNTER — Encounter: Payer: Self-pay | Admitting: Pulmonary Disease

## 2010-07-27 ENCOUNTER — Other Ambulatory Visit: Payer: Self-pay | Admitting: Endocrinology

## 2010-08-21 ENCOUNTER — Telehealth: Payer: Self-pay

## 2010-08-21 NOTE — Telephone Encounter (Signed)
Ok to reschedule

## 2010-08-21 NOTE — Telephone Encounter (Signed)
Patient notified and will call back to r/s

## 2010-08-21 NOTE — Telephone Encounter (Signed)
Patient called [facility] stating that he has appt 09/03/10 to have A1C check. Per pt he had a cortisone injection done 3 wks ago ([facility]) which elevates his glucose. Patient would like to know if MD thinks that his appt/labs should be r/s to a later date. Please advise Thanks

## 2010-08-27 ENCOUNTER — Other Ambulatory Visit: Payer: Self-pay | Admitting: *Deleted

## 2010-08-27 MED ORDER — INSULIN NPH (HUMAN) (ISOPHANE) 100 UNIT/ML ~~LOC~~ SUSP: 340.0000 [IU] | Freq: Every day | SUBCUTANEOUS | Status: DC

## 2010-08-27 NOTE — Telephone Encounter (Signed)
R'cd fax from [place] for refill of Humulin N  Last OV-05/28/2010    Last filled-07/22/2010

## 2010-09-03 ENCOUNTER — Ambulatory Visit: Payer: Medicare Other | Admitting: Endocrinology

## 2010-09-20 ENCOUNTER — Telehealth: Payer: Self-pay | Admitting: Pulmonary Disease

## 2010-09-20 NOTE — Telephone Encounter (Signed)
No message needed °

## 2010-10-04 ENCOUNTER — Ambulatory Visit: Payer: Medicare Other | Admitting: Endocrinology

## 2010-10-05 ENCOUNTER — Other Ambulatory Visit (INDEPENDENT_AMBULATORY_CARE_PROVIDER_SITE_OTHER): Payer: Medicare Other

## 2010-10-05 ENCOUNTER — Ambulatory Visit (INDEPENDENT_AMBULATORY_CARE_PROVIDER_SITE_OTHER): Payer: Medicare Other | Admitting: Endocrinology

## 2010-10-05 ENCOUNTER — Ambulatory Visit: Payer: Medicare Other | Admitting: Endocrinology

## 2010-10-05 ENCOUNTER — Encounter: Payer: Self-pay | Admitting: Endocrinology

## 2010-10-05 VITALS — BP 128/58 | HR 53 | Temp 97.7°F | Ht 67.5 in | Wt 288.1 lb

## 2010-10-05 DIAGNOSIS — M545 Low back pain, unspecified: Secondary | ICD-10-CM

## 2010-10-05 DIAGNOSIS — E119 Type 2 diabetes mellitus without complications: Secondary | ICD-10-CM

## 2010-10-05 DIAGNOSIS — D510 Vitamin B12 deficiency anemia due to intrinsic factor deficiency: Secondary | ICD-10-CM

## 2010-10-05 DIAGNOSIS — G4700 Insomnia, unspecified: Secondary | ICD-10-CM

## 2010-10-05 DIAGNOSIS — Z23 Encounter for immunization: Secondary | ICD-10-CM

## 2010-10-05 MED ORDER — ALPRAZOLAM 2 MG PO TABS: 2.0000 mg | ORAL_TABLET | Freq: Every evening | ORAL | Status: AC | PRN

## 2010-10-05 MED ORDER — CYANOCOBALAMIN 1000 MCG/ML IJ SOLN
1000.0000 ug | Freq: Once | INTRAMUSCULAR | Status: AC
  Administered 2010-10-05: 1000 ug via INTRAMUSCULAR

## 2010-10-05 NOTE — Progress Notes (Signed)
Subjective:    Patient ID: Donald [name], male    DOB: 12-15-1945, 66 y.o.   MRN: [place]:[id number]  HPI Pt sees dr Apolonio Schneiders for his moderate low-back pain x a few years, as it has flared up again.  No assoc numbness.   he brings a record of his cbg's which i have reviewed today.  It varies from 100-180.  It is in general higher in am than at hs (the only 2 times of day he checks).   The temazepam no longer helps him sleep.   Past Medical History  Diagnosis Date  . DIABETES MELLITUS, TYPE II 07/26/2006  . DYSLIPIDEMIA 04/13/2009  . ANEMIA-IRON DEFICIENCY 07/26/2006  . Pernicious anemia 11/20/2006  . DEPRESSION 03/14/2009  . PERIPHERAL NEUROPATHY 07/26/2006  . HYPERTENSION 07/26/2006  . PULMONARY EMBOLISM 10/05/2007  . D V T 12/03/2007  . ASTHMA 07/26/2006  . GERD 07/26/2006  . [place] DISEASE, LUMBAR 10/05/2007  . INSOMNIA 08/21/2007  . OBSTRUCTIVE SLEEP APNEA 12/03/2007  . COLONIC POLYPS, HX OF 07/26/2006    Past Surgical History  Procedure Date  . Appendectomy 1968  . Back surgery 1977, 06/14/2008    Dr. Trenton Gammon at [facility] (06/10)    History   Social History  . Marital Status: Married    Spouse Name: N/A    Number of Children: N/A  . Years of Education: N/A   Occupational History  . Disabled    Social History Main Topics  . Smoking status: Never Smoker   . Smokeless tobacco: Not on file  . Alcohol Use: No  . Drug Use: No  . Sexually Active:    Other Topics Concern  . 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 albuterol (PROVENTIL) (2.5 MG/3ML) 0.083% nebulizer solution Take 2.5 mg by nebulization every 4 (four) hours as needed. For wheezing       . cyanocobalamin (,VITAMIN B-12,) 1000 MCG/ML injection 1 injection every 3  months       . diltiazem (DILACOR XR) 120 MG 24 hr capsule Take 120 mg by mouth daily.        . famotidine (PEPCID AC) 10 MG chewable tablet Chew 10 mg by mouth at bedtime.        . furosemide (LASIX) 80 MG tablet Take 80 mg by mouth 2 (two) times daily.        Marland Kitchen HYDROcodone-acetaminophen (NORCO) 10-325 MG per tablet Take 1 tablet by mouth every 4 (four) hours as needed. For pain       . insulin lispro (HUMALOG) 100 UNIT/ML injection Three times daily (just before each meal) 160-100-220  140 mL  3  . losartan (COZAAR) 100 MG tablet Take 100 mg by mouth daily.        Marland Kitchen lovastatin (MEVACOR) 40 MG tablet 2 tablets by mouth two times a day       . metFORMIN (GLUCOPHAGE-XR) 500 MG 24 hr tablet 2 tablets by mouth two times a day       . tadalafil (CIALIS) 20 MG tablet as needed.          Allergies  Allergen Reactions  . Advicor   . Codeine     REACTION: chest pain  . Pioglitazone     Family History  Problem Relation Age  of Onset  . Cancer Mother     Breast Cancer  . Cancer Sister     Breast Cancer    BP 128/58  Pulse 53  Temp(Src) 97.7 F (36.5 C) (Oral)  Ht 5' 7.5" (1.715 m)  Wt 288 lb 1.9 oz (130.69 kg)  BMI 44.46 kg/m2  SpO2 97%  Review of Systems denies hypoglycemia and depression    Objective:   Physical Exam VITAL SIGNS:  See vs page GENERAL: no distress.  Obese Pulses: dorsalis pedis intact bilat.   Feet: no deformity.  no ulcer on the feet.  feet are of normal color and temp.  Trace bilat leg edema Neuro: sensation is intact to touch on the feet  Lab Results  Component Value Date   HGBA1C 7.6* 10/05/2010       Assessment & Plan:  Dm, needs increased rx Insomnia, needs increased rx Low-back pain.  His pain rx can interfere with xanax, but we'll do the best we can

## 2010-10-05 NOTE — Patient Instructions (Addendum)
blood tests are being ordered for you today.  please call [phone] to hear your test results.  You will be prompted to enter the 9-digit "MRN" number that appears at the top left of this page, followed by #.  Then you will hear the message.  pending the test results, please: continue the humalog to three times a day (just before each meal) 190-100-220 units, and: increse nph insulin to 380 units at bedtime.  Please make a follow-up appointment in 4 months. check your blood sugar 2 times a day.  vary the time of day when you check, between before the 3 meals, and at bedtime.  also check if you have symptoms of your blood sugar being too high or too low.  please keep a record of the readings and bring it to your next appointment here.  please call us sooner if you are having low blood sugar episodes.   Change temazepam to alprazolam 2 mg at bedtime as needed for sleep. (update: i left message on phone-tree:  rx as we discussed)

## 2010-10-17 LAB — CBC
HCT: 36.2 — ABNORMAL LOW
HCT: 40.5
Hemoglobin: 12.4 — ABNORMAL LOW
Hemoglobin: 13.9
MCHC: 34.1
MCHC: 34.3
MCV: 89.7
Platelets: 197
Platelets: 219
RBC: 4.03 — ABNORMAL LOW
RBC: 4.52
RDW: 14.5
RDW: 15.2
WBC: 10.4
WBC: 8.6

## 2010-10-17 LAB — DIFFERENTIAL
Basophils Absolute: 0
Basophils Relative: 0
Eosinophils Absolute: 0.1
Eosinophils Relative: 1
Lymphocytes Relative: 18
Lymphs Abs: 1.8
Monocytes Absolute: 0.8
Monocytes Relative: 8
Neutro Abs: 7.6
Neutrophils Relative %: 73

## 2010-10-17 LAB — PROTIME-INR
INR: 1
INR: 1.1
INR: 1.3
Prothrombin Time: 13.5
Prothrombin Time: 14
Prothrombin Time: 17 — ABNORMAL HIGH

## 2010-10-17 LAB — BASIC METABOLIC PANEL
BUN: 18
BUN: 18
CO2: 25
CO2: 27
Calcium: 8.9
Calcium: 9.2
Chloride: 104
Creatinine, Ser: 1.33
GFR calc Af Amer: >60
GFR calc non Af Amer: 55 — ABNORMAL LOW
Glucose, Bld: 192 — ABNORMAL HIGH
Glucose, Bld: 94
Potassium: 3.1 — ABNORMAL LOW
Sodium: 139
Sodium: 141

## 2010-10-17 LAB — GLUCOSE, CAPILLARY
Glucose-Capillary: 152 — ABNORMAL HIGH
Glucose-Capillary: 263 — ABNORMAL HIGH

## 2010-10-23 ENCOUNTER — Other Ambulatory Visit: Payer: Self-pay | Admitting: Neurosurgery

## 2010-10-23 DIAGNOSIS — M79604 Pain in right leg: Secondary | ICD-10-CM

## 2010-10-24 ENCOUNTER — Ambulatory Visit
Admission: RE | Admit: 2010-10-24 | Discharge: 2010-10-24 | Disposition: A | Discharge status: Home / Self Care | Payer: Medicare Other | Source: Ambulatory Visit | Attending: Neurosurgery | Admitting: Neurosurgery

## 2010-10-24 ENCOUNTER — Other Ambulatory Visit: Payer: Self-pay | Admitting: Neurosurgery

## 2010-10-24 DIAGNOSIS — M79661 Pain in right lower leg: Secondary | ICD-10-CM

## 2010-10-24 MED ORDER — IOHEXOL 300 MG/ML  SOLN
125.0000 mL | Freq: Once | INTRAMUSCULAR | Status: AC | PRN
  Administered 2010-10-24: 125 mL via INTRAVENOUS

## 2010-10-25 ENCOUNTER — Inpatient Hospital Stay: Admission: RE | Admit: 2010-10-25 | Payer: Medicare Other | Source: Ambulatory Visit

## 2010-11-08 ENCOUNTER — Emergency Department: Payer: No Typology Code available for payment source | Admitting: Emergency Medicine

## 2010-11-09 ENCOUNTER — Inpatient Hospital Stay (HOSPITAL_COMMUNITY): Payer: Medicare Other

## 2010-11-09 ENCOUNTER — Inpatient Hospital Stay (HOSPITAL_COMMUNITY)
Admission: RE | Admit: 2010-11-09 | Discharge: 2010-11-13 | DRG: 683 | Disposition: A | Discharge status: Home / Self Care | Payer: Medicare Other | Source: Other Acute Inpatient Hospital | Attending: Internal Medicine | Admitting: Internal Medicine

## 2010-11-09 DIAGNOSIS — R110 Nausea: Secondary | ICD-10-CM

## 2010-11-09 DIAGNOSIS — I129 Hypertensive chronic kidney disease with stage 1 through stage 4 chronic kidney disease, or unspecified chronic kidney disease: Principal | ICD-10-CM | POA: Diagnosis present

## 2010-11-09 DIAGNOSIS — K219 Gastro-esophageal reflux disease without esophagitis: Secondary | ICD-10-CM | POA: Diagnosis present

## 2010-11-09 DIAGNOSIS — M545 Low back pain, unspecified: Secondary | ICD-10-CM | POA: Diagnosis present

## 2010-11-09 DIAGNOSIS — E119 Type 2 diabetes mellitus without complications: Secondary | ICD-10-CM | POA: Diagnosis present

## 2010-11-09 DIAGNOSIS — G4733 Obstructive sleep apnea (adult) (pediatric): Secondary | ICD-10-CM | POA: Diagnosis present

## 2010-11-09 DIAGNOSIS — E785 Hyperlipidemia, unspecified: Secondary | ICD-10-CM | POA: Diagnosis present

## 2010-11-09 DIAGNOSIS — E669 Obesity, unspecified: Secondary | ICD-10-CM | POA: Diagnosis present

## 2010-11-09 DIAGNOSIS — D649 Anemia, unspecified: Secondary | ICD-10-CM | POA: Diagnosis present

## 2010-11-09 DIAGNOSIS — G8929 Other chronic pain: Secondary | ICD-10-CM | POA: Diagnosis present

## 2010-11-09 DIAGNOSIS — Z86711 Personal history of pulmonary embolism: Secondary | ICD-10-CM

## 2010-11-09 DIAGNOSIS — R079 Chest pain, unspecified: Secondary | ICD-10-CM

## 2010-11-09 DIAGNOSIS — I517 Cardiomegaly: Secondary | ICD-10-CM

## 2010-11-09 DIAGNOSIS — N182 Chronic kidney disease, stage 2 (mild): Secondary | ICD-10-CM | POA: Diagnosis present

## 2010-11-09 DIAGNOSIS — Z86718 Personal history of other venous thrombosis and embolism: Secondary | ICD-10-CM

## 2010-11-09 DIAGNOSIS — I2510 Atherosclerotic heart disease of native coronary artery without angina pectoris: Secondary | ICD-10-CM | POA: Diagnosis present

## 2010-11-09 DIAGNOSIS — Z6841 Body Mass Index (BMI) 40.0 and over, adult: Secondary | ICD-10-CM

## 2010-11-09 LAB — GLUCOSE, CAPILLARY
Glucose-Capillary: 280 mg/dL — ABNORMAL HIGH (ref 70–99)
Glucose-Capillary: 302 mg/dL — ABNORMAL HIGH (ref 70–99)
Glucose-Capillary: 288 mg/dL — ABNORMAL HIGH (ref 70–99)
Glucose-Capillary: 258 mg/dL — ABNORMAL HIGH (ref 70–99)
Glucose-Capillary: 113 mg/dL — ABNORMAL HIGH (ref 70–99)

## 2010-11-09 LAB — CBC
HCT: 36.7 % — ABNORMAL LOW (ref 39.0–52.0)
Hemoglobin: 12.5 g/dL — ABNORMAL LOW (ref 13.0–17.0)
MCHC: 34.1 g/dL (ref 30.0–36.0)
MCV: 82.1 fL (ref 78.0–100.0)
RDW: 13.8 % (ref 11.5–15.5)

## 2010-11-09 LAB — LIPID PANEL
LDL Cholesterol: 66 mg/dL (ref 0–99)
Total CHOL/HDL Ratio: 4.6 RATIO
VLDL: 38 mg/dL (ref 0–40)

## 2010-11-09 LAB — COMPREHENSIVE METABOLIC PANEL
ALT: 16 U/L (ref 0–53)
Albumin: 3.5 g/dL (ref 3.5–5.2)
Alkaline Phosphatase: 74 U/L (ref 39–117)
Chloride: 101 mEq/L (ref 96–112)
Glucose, Bld: 310 mg/dL — ABNORMAL HIGH (ref 70–99)
Potassium: 5 mEq/L (ref 3.5–5.1)
Sodium: 137 mEq/L (ref 135–145)
Total Bilirubin: 0.7 mg/dL (ref 0.3–1.2)
Total Protein: 6.5 g/dL (ref 6.0–8.3)

## 2010-11-09 LAB — HEMOGLOBIN A1C: Mean Plasma Glucose: 174 mg/dL — ABNORMAL HIGH (ref <117)

## 2010-11-09 LAB — PROTIME-INR
INR: 1.17 (ref 0.00–1.49)
Prothrombin Time: 15.1 seconds (ref 11.6–15.2)

## 2010-11-09 LAB — HEPARIN LEVEL (UNFRACTIONATED): Heparin Unfractionated: 0.16 IU/mL — ABNORMAL LOW (ref 0.30–0.70)

## 2010-11-09 LAB — CARDIAC PANEL(CRET KIN+CKTOT+MB+TROPI)
CK, MB: 9.3 ng/mL (ref 0.3–4.0)
CK, MB: 8.5 ng/mL (ref 0.3–4.0)
Relative Index: 2.9 — ABNORMAL HIGH (ref 0.0–2.5)
Total CK: 331 U/L — ABNORMAL HIGH (ref 7–232)
Total CK: 266 U/L — ABNORMAL HIGH (ref 7–232)
Troponin I: <0.3 ng/mL (ref <0.30)

## 2010-11-09 MED ORDER — IOHEXOL 350 MG/ML SOLN
100.0000 mL | Freq: Once | INTRAVENOUS | Status: AC | PRN
  Administered 2010-11-09: 100 mL via INTRAVENOUS

## 2010-11-09 NOTE — H&P (Signed)
NAMEMarland Kitchen  SAHAS, BERRIEN NO.:  [id number]  MEDICAL RECORD NO.:  [id number]  LOCATION:  2920                         FACILITY:  [facility]  PHYSICIAN:  Lottie Dawson, MD       DATE OF BIRTH:  Feb 04, 1945  DATE OF ADMISSION:  11/09/2010 DATE OF DISCHARGE:                             HISTORY & PHYSICAL   PRIMARY CARE PHYSICIAN:  Sean A. Loanne Drilling, MD  NEUROSURGEONCooper Render. Pool, MD  CHIEF COMPLAINT:  Chest pain.  HISTORY OF PRESENT ILLNESS:  Mr. Pasley is a 65 year old Caucasian male with history of hypertension, hyperlipidemia, type 2 diabetes, history of DVT and PE,  history of low back pain, GERD who presents with the above complaints.  He reports that his symptoms started yesterday, November 08, 2010 at 8:30 p.m.  Reports that he had a sharp chest pain that in a bandlike fashion that radiated from his left breast to his back while seated.  He reports the pain is being sharp in nature.  With this pain, he had diaphoresis. Denies any jaw pain or  pain running down his arm.  He presented to [facility] Emergency Department and a given that most of his care is at [facility]. Patient and family requested to be transferred to [facility].  The patient does reports that he has not had any symptoms like these in the past. Currently denies any chest pain. Denies any abdominal pain.  Denies any recent fevers or chills.  Did feel nauseated yesterday and vomited at [facility] Emergency Department. Denies any diarrhea today. He did report that he had an episode of diarrhea yesterday after receiving an epidural for his chronic low back pain.  The patient reports that he received epidural under the care of Dr. Nelva Bush with [facility] yesterday.  He denies any headache. Denies any vision changes.  REVIEW OF SYSTEMS:  All systems were reviewed with the patient was positive as per HPI.  Otherwise, all other systems are negative.  PAST MEDICAL HISTORY: 1. Hypertension. 2.  Hyperlipidemia. 3. Type 2 diabetes. 4. History of DVT of the left lower extremity in September, 2009     and PE. 5. Chronic low back pain, status post L4-L5 decompressive laminectomy     and foraminectomy in June, 2010, under the care of Dr. Annette Stable. 6. History of GERD. 7. History of anemia. 8. Obesity. 9. Obstructive sleep apnea. 10.Chronic kidney disease, stage II.  SOCIAL HISTORY:  The patient does not smoke, does not drink any alcohol. The patient does report that he uses a cane for his chronic low back pain.  FAMILY HISTORY:  Significant for father having diabetes and had a heart attack, died at the age of 37 from COPD.  Mother died at the age of 24 from failure to thrive.  PHYSICAL EXAMINATION:  VITAL SIGNS:  Temperature is afebrile, pulse is 74, respiration around 18, satting at 96% on few L of oxygen,  blood pressure is 202/78. GENERAL:  The patient was alert, oriented, not appear to be in acute distress, was lying in bed comfortably. HEENT:  Extraocular motions are intact.  Pupils equal, round, had moist mucous membranes. NECK:  Supple. HEART:  Regular with S1, S2. LUNGS:  Clear to auscultation bilaterally. ABDOMEN:  Soft, nontender, nondistended.  Positive bowel sounds. EXTREMITIES:  Patient good peripheral pulses with trace edema.  No clubbing or cyanosis. NEURO:  Cranial nerves 2-12 grossly intact.  Had 5/5 motor strength in upper as well as lower extremities.  RADIOLOGY/IMAGING:  The patient had a chest x-ray in [place]. Radiology report is pending but on my evaluation did not see any acute intracranial process.  LABORATORY DATA:  At [facility] show CBC with a white count of 11.8, hemoglobin 14.1, hematocrit 42.3, platelet count 204. Troponin I was positive at 0.08 greater than 0.05 is positive for their labs.  Electrolytes normal with creatinine of 1.51.  Liver function tests normal.  He has had a chronic kidney disease.  ASSESSMENT AND  PLAN: 1. Chest pain.  We will admit the patient and we will rule him out for     acute coronary syndrome.  Currently is on a heparin drip.  Continue     aspirin.  The patient is not on a beta blocker.  We will have the     patient on low-dose Coreg.  Given patient's risk factors, [facility]     [facility] has been consulted to help manage further. If the     patient is ruled out for acute coronary syndrome, we will     discontinue heparin drip, and we will have the patient on subcu     Lovenox for prophylaxis. Will defer to cardiology for further     testing if ruled out for acute coronary syndrome. 2. Uncontrolled/malignant hypertension.  The patient's blood pressure     is not well controlled at this time.  Continue home medications,     with hold parameters.  Low-dose Coreg has been added to his     regimen. Holding diltiazem while on coreg. 3. Hyperlipidemia.  Continue statin.  We will check fasting lipids. 4. Type 2 diabetes.  We will continue home insulin regimen, and we     will hold his home insulin regimen if n.p.o.  We will have the     patient on the sliding scale. 5. Chronic kidney disease, stage II.  The patient has a creatinine at     baseline ranging from 1.3-1.5.  Laboratory work from [facility]     suggests creatinine is 1.5.  Continue to monitor for now. 6. History of gastroesophageal reflux disease.  We will have the     patient on PPI. 7. History of low back pain, status post L4-L5 decompressive     laminectomy and foraminectomy.  Currently managed by Dr. Annette Stable and     Dr. Nelva Bush with Orthopedic Surgery.  Just recently received epidural     injection.  Continue pain management. 8. Obstructive sleep apnea.  We will have the patient on CPAP. 9. Prophylaxis.  Currently on a heparin drip. 10.Code status. The patient is a full code. This was discussed     with patient and family at the time of admission.  Time spent on admission, talking to the patient, consultants, family  and coordinating care was 1 hour.   Lottie Dawson, MD   SR/MEDQ  D:  11/09/2010  T:  11/09/2010  Job:  [id number]  Electronically Signed by Alveta Heimlich Jakaiya Netherland  on 11/09/2010 02:21:12 PM

## 2010-11-10 LAB — GLUCOSE, CAPILLARY
Glucose-Capillary: 102 mg/dL — ABNORMAL HIGH (ref 70–99)
Glucose-Capillary: 114 mg/dL — ABNORMAL HIGH (ref 70–99)

## 2010-11-10 LAB — CBC
Hemoglobin: 11.4 g/dL — ABNORMAL LOW (ref 13.0–17.0)
Platelets: 194 10*3/uL (ref 150–400)
RBC: 4.15 MIL/uL — ABNORMAL LOW (ref 4.22–5.81)
WBC: 12.2 10*3/uL — ABNORMAL HIGH (ref 4.0–10.5)

## 2010-11-10 LAB — COMPREHENSIVE METABOLIC PANEL
ALT: 13 U/L (ref 0–53)
AST: 10 U/L (ref 0–37)
Albumin: 3.4 g/dL — ABNORMAL LOW (ref 3.5–5.2)
CO2: 29 mEq/L (ref 19–32)
Chloride: 102 mEq/L (ref 96–112)
GFR calc non Af Amer: 56 mL/min — ABNORMAL LOW (ref >90)
Potassium: 4.3 mEq/L (ref 3.5–5.1)
Sodium: 139 mEq/L (ref 135–145)
Total Bilirubin: 1.2 mg/dL (ref 0.3–1.2)

## 2010-11-10 LAB — CARDIAC PANEL(CRET KIN+CKTOT+MB+TROPI)
Relative Index: 3.2 — ABNORMAL HIGH (ref 0.0–2.5)
Troponin I: <0.3 ng/mL (ref <0.30)

## 2010-11-10 LAB — HEPARIN LEVEL (UNFRACTIONATED)
Heparin Unfractionated: 0.41 IU/mL (ref 0.30–0.70)
Heparin Unfractionated: 0.59 IU/mL (ref 0.30–0.70)

## 2010-11-11 DIAGNOSIS — I200 Unstable angina: Secondary | ICD-10-CM

## 2010-11-11 LAB — CBC
HCT: 35.9 % — ABNORMAL LOW (ref 39.0–52.0)
MCHC: 32.9 g/dL (ref 30.0–36.0)
MCV: 83.9 fL (ref 78.0–100.0)
Platelets: 182 10*3/uL (ref 150–400)
RDW: 13.9 % (ref 11.5–15.5)
WBC: 11.4 10*3/uL — ABNORMAL HIGH (ref 4.0–10.5)

## 2010-11-11 LAB — BASIC METABOLIC PANEL
BUN: 23 mg/dL (ref 6–23)
Calcium: 9.2 mg/dL (ref 8.4–10.5)
GFR calc Af Amer: 66 mL/min — ABNORMAL LOW (ref >90)
GFR calc non Af Amer: 57 mL/min — ABNORMAL LOW (ref >90)
Glucose, Bld: 176 mg/dL — ABNORMAL HIGH (ref 70–99)
Potassium: 4.1 mEq/L (ref 3.5–5.1)
Sodium: 139 mEq/L (ref 135–145)

## 2010-11-11 LAB — GLUCOSE, CAPILLARY: Glucose-Capillary: 83 mg/dL (ref 70–99)

## 2010-11-11 NOTE — Consult Note (Signed)
Donald [name], Donald [name] NO.:  [id number]  MEDICAL RECORD NO.:  [id number]  LOCATION:  2914                         FACILITY:  [facility]  PHYSICIAN:  Juanda Bond. Burt Knack, MD  DATE OF BIRTH:  Mar 27, 1945  DATE OF CONSULTATION:  11/09/2010 DATE OF DISCHARGE:                                CONSULTATION   REASON FOR CONSULTATION:  Chest pain.  HISTORY OF PRESENT ILLNESS:  This is a 65 year old male with longstanding diabetes, hypertension, and history of pulmonary embolus in 2009 associated with DVT.  He presents with abrupt onset of chest pain that started yesterday about 8:30 p.m.  He describes pain across the anterior chest radiating straight through to the back that felt sharp and was associated with diaphoresis and nausea.  There was no pleuritic component noted.  The pain was not affected by movement.  The patient has some difficulty describing his pain as he deals with chronic back and neck pain, but this was clearly different than that type of pain. He had an "aching" pain across his chest for much of the night and continues to have very mild discomfort this morning.  He feels much more comfortable at the present time.  His symptoms were associated with marked elevation of blood pressure with a systolic pressure greater than 200 mmHg.  The patient was initially treated with nitroglycerin paste but this has been changed to a nitroglycerin drip for more titratable medication.  His blood pressure in the past has been under good control per his report.  He denies headache, numbness, tingling, or weakness of his extremities.  He denies shortness of breath.  The patient has no other complaints.  He was interviewed initially in Radiology as he was undergoing a CT angiogram of his chest and the interview was completed up in the CCU as he returned to this unit.  MEDICATIONS: 1. Aspirin 325 mg. 2. NovoLog and Novolin insulin. 3. Cozaar 100 mg. 4. Protonix 40 mg. 5.  Crestor 20 mg. 6. Cardizem 120 mg daily. 7. Metformin. 8. Lovastatin. 9. Lasix 80 mg twice daily. 10.Albuterol.  ALLERGIES:  CODEINE, ACTOS, and MORPHINE.  PAST MEDICAL HISTORY: 1. Type 2 diabetes with a huge insulin requirement.  The patient takes     340 units of NPH insulin at bedtime and 100-220 units of Humalog     insulin.  He is also on 2000 mg of metformin daily. 2. History of DVT in the left lower extremity 2009 associated with     pulmonary embolus. 3. Hypertension. 4. Hyperlipidemia. 5. Chronic back pain status post L4-L5 decompressive laminectomy. 6. Chronic neck pain status post neck surgery. 7. Gastroesophageal reflux disease. 8. History of anemia. 9. Morbid obesity. 10.Obstructive sleep apnea.  The patient wears CPAP at night and for     naps which is autotitrated between 15 and 25 cm of water pressure. 11.Chronic kidney disease stage II.  SOCIAL HISTORY:  The patient is married.  He ambulates with a cane.  He is disabled.  The patient lives in [place], [place].  He does not smoke cigarettes or drink alcohol.  FAMILY HISTORY:  His father had a heart attack at age 15.  He died at  age 43 from COPD.  He was a heavy smoker.  His mother died of old age at age 10.  REVIEW OF SYSTEMS:  Positive for nausea and vomiting, otherwise negative except as per HPI.  PHYSICAL EXAMINATION:  GENERAL:  The patient is alert and oriented.  He is in no acute distress.  He is a morbidly obese male. VITAL SIGNS:  Blood pressure in the right arm is 173/54, left arm 169/48, temperature is 98.6, heart rate 68, respiratory rate is 18, oxygen saturations 99% on 2 L per nasal cannula. HEENT:  Normal. NECK:  Normal carotid upstrokes without bruits.  There is no palpable thyromegaly.  I am unable to visualize jugular venous pressure.  There is no lymphadenopathy. LUNGS:  Clear bilaterally with the exception of mild expiratory wheezing. CARDIOVASCULAR:  There is a palpable RV  impulse. HEART:  Regular rate and rhythm with a short systolic ejection murmur at the left sternal border.  There are no diastolic murmurs or gallops appreciated. ABDOMEN:  Soft, morbidly obese.  No tenderness. BACK:  No CVA tenderness. EXTREMITIES:  No clubbing, cyanosis, or edema.  Pedal and radial pulses are 3+ and equal bilaterally.  Feet are warm.  There is no rash present on the skin. NEUROLOGIC:  The patient is alert and oriented.  Cranial nerves II-XII are grossly intact.  Strength is 5/5 in all extremities.  Chest x-ray from [facility] showed cardiomegaly with slight widening of the mediastinum per verbal report.  EKG shows normal sinus rhythm with a nonspecific T-wave abnormality. There are no acute changes.  Initial labs show a troponin of 0.08 with the upper limit of normal of 0.05.  LFTs are within normal limits.  Blood glucose was 168, creatinine 1.5, BUN 20, potassium 4.1, white blood cell count is 11.8, hemoglobin is 14.1, hematocrit 42.3, platelet count is 204,000.  Labs from [facility] are currently pending.  The patient's last hemoglobin A1c on September 21 was 7.6.  The patient just had a CT angiogram of the chest October 10 that was negative.  There was cardiomegaly and coronary calcium but no evidence of pulmonary embolus. The thoracic aorta showed no acute abnormality.  FINAL ASSESSMENT: 1. Chest pain.  This patient has chest pain presentation worrisome for     acute coronary syndrome but in the differential there is also acute     aortic dissection and acute pulmonary embolus.  This patient has a     history of deep venous thrombosis and pulmonary embolism and also     has typical historical characteristics of acute dissection with     sharp pain radiating straight through to the back in the setting of     malignant hypertension.  A CT angiogram will be done and ordered     stat.  The patient has been on heparin and this will be temporarily     placed on  hold.  He did have a recent CT but his symptom onset is     acute and just started last night.  Depending on the findings of     the CT scan, we will make further treatment decisions.  If his CT     is negative for both pulmonary embolus and aortic dissection, then     acute coronary syndrome is the most likely serious diagnosis.  The     patient will be treated as such and again we will wait on the     results of the CAT scan before reinitiating  intravenous     unfractionated heparin.  The patient will be transferred to the CCU     as he has had to have frequent titration of his nitroglycerin for     pain control and blood pressure control.  He currently is     comfortable and if he requires cardiac catheterization, it would be     ideal to wait since he just had a contrast load for his CAT scan.     The patient will be hydrated with 0.9% normal saline, and his ACE     inhibitor will be held for the next 48 hours to try to reduce the     risk of contrast nephropathy. 2. Hypertensive urgency.  I will continue his home medications and use     IV nitroglycerin in the short term.  Medications will be adjusted     according to the blood pressure response. 3. Type 2 diabetes.  This will be managed by the hospitalist team.  Thank you for the opportunity to see Mr. Gallagher.  We will follow along during his hospital course.  Please feel free to call at any time with questions.     Juanda Bond. Burt Knack, MD     MDC/MEDQ  D:  11/09/2010  T:  11/09/2010  Job:  [id number]  cc:   Lottie Dawson, MD Jacelyn Pi. Loanne Drilling, MD Sylis Ketchum Render Pool, M.D.  Electronically Signed by Sherren Mocha MD on 11/11/2010 10:09:22 PM

## 2010-11-11 NOTE — Consult Note (Addendum)
NAMEDEMETRIC, NORTHRIP NO.:  [id number]  MEDICAL RECORD NO.:  [id number]  LOCATION:  2914                         FACILITY:  [facility]  PHYSICIAN:  Juanda Bond. Burt Knack, MD  DATE OF BIRTH:  07/08/1945  DATE OF CONSULTATION:  11/09/2010 DATE OF DISCHARGE:                                CONSULTATION   PRIMARY CARDIOLOGIST:  [place].  PULMONOLOGIST:  Rigoberto Noel, MD  PRIMARY MEDICAL DOCTOR:  Sean A. Loanne Drilling, MD  CHIEF COMPLAINT:  Chest pain and nausea.  HISTORY OF PRESENT ILLNESS:  Mr. Artrip is a 65 year old gentleman with no prior cardiac history but a history of diabetes, hypertension, PE/DVT, sleep apnea, and asthmatic bronchitis who is here with chest pain.  Yesterday at approximately 8:30 p.m. after sitting down in his stance, he developed sudden onset of chest pain/back pain.  He feels it is hard to characterize, and explains that it just hurts, but denies it being a stabbing/sharp pain.  He denies any shortness of breath but did have significant diaphoresis and nausea with this.  He had his wife call EMS and he was subsequently taken to the nearest ER at [facility].  Three sublingual nitroglycerin helped to ease the pain and Dilaudid helped to relieve the nausea and diaphoresis.  He was transferred to [facility] at his request, but still has intermittent chest discomfort, nausea coming in waves.  Of note, he was very hypertensive upon admission to [facility] at [id number], systolic pressure now lowered down into the 160s.  Troponin at [facility] and point- of-care appears to have been slightly elevated at 0.08, upper limit of the range being 0.05.  The patient states his blood pressure usually runs in the 115-120s, but more recently including his epidural injection yesterday was in the 150s.  PAST MEDICAL HISTORY: 1. Diabetes mellitus, insulin dependent. 2. Hypertension. 3. Left DVT/extensive bilateral PE in September 2009, felt to be  idiopathic.     a.     He was on Coumadin until October 2010.     b.     CT angio was negative for PE October 24, 2010.     c.     A 2D echocardiogram done at that time showed no evidence of      heart strain, EF was 60-70%. 4. GERD. 5. Coronary artery calcification and cardiomegaly by CT angio October 24, 2010. 6. Anemia, receives B12 injections every 3 months. 7. Hematuria.  The patient had episode of renal colic in [id number] and per     his primary care doctor's note, stone was seen on ultrasound and     the patient was told he would never pass it and would have     intermittent hematuria from time to time. 8. Obstructive sleep apnea on CPAP. 9. Asthmatic bronchitis. 10.Chronic back pain, received an epidural yesterday.  He has a     history of bulging disk.  SURGICAL HISTORY:  Neck cyst removal remotely, C-spine surgery remotely, back surgery, shoulder surgery, and appendectomy.  INPATIENT MEDICATIONS: 1. Heparin. 2. Aspirin 325 mg daily. 3. NovoLog 100 units at noon, 220 units daily with supper, 160 units     daily  with meals and sliding scale as well as Novolin 340 units at     bedtime. 4. Cozaar 100 mg daily. 5. Protonix 40 mg daily. 6. Crestor 20 mg daily and fluids going at 75 mL/hour.  OUTPATIENT MEDICATIONS:  Please see med rec, but noted to have also included Cardizem 120 mg daily and Lasix 80 mg b.i.d.  ALLERGIES:  CODEINE, ACTOS, and MORPHINE.  SOCIAL HISTORY:  Mr. Charlet lives in [place].  He is disabled.  He is married and has a son who lives in [place], but works at the [employer].  He denies any tobacco or alcohol use.  FAMILY HISTORY:  Mother died at 77 of old age.  She has also had breast cancer.  Father died at 5 of emphysema and was diagnosed with heart disease in his 61s and also had diabetes.  He has 2 sisters, the youngest who just has diabetes.  His older sister has had significant respiratory problems as well as breast  cancer.  REVIEW OF SYSTEMS:  No fevers, chills.  Positive for diaphoresis. Positive for chest pain.  No shortness of breath.  No lower extremity edema or syncope.  Positive for nausea, vomiting.  All other systems reviewed and otherwise negative.  LABORATORY DATA:  WBC 11.8, hemoglobin 14.1, hematocrit 42.3, platelet count 204,000.  Sodium 143, potassium 4.1, chloride 104, CO2 29, glucose 168, BUN 20, creatinine 1.51.  LFTs were okay with exception of decreased ALT at 27.  First point-of-care troponin was 0.08, the upper limits being 0.05.  EKG:  Normal sinus rhythm at 74 beats per minute with T-wave inversion in V6 with nonspecific ST-T changes.  RADIOLOGIC STUDIES:  Chest x-ray.  The patient had a chest x-ray at [facility], but has not been read yet.  I am currently waiting on a call from the Radiology Department.  Per the radiologist, there is on- call to look at and call us back with results.  PHYSICAL EXAMINATION:  VITAL SIGNS:  Temperature 98.6, pulse 64, respirations 18, blood pressure 169/48 in the left, 173/54 in the right. Pulse ox 99% on 2 L. GENERAL:  This is a pleasant but generally reserved white male in no acute distress. HEENT:  Normocephalic, atraumatic.  Extraocular movements intact.  Clear sclerae.  Nares are without discharge. NECK:  Supple with a left carotid bruit.  JVD is difficult to assess with body habitus. HEART:  Auscultation to the heart reveals regular rate and rhythm with S1 and S2 with a short systolic ejection murmur at the left lower sternal border. LUNGS:  Lung sounds are coarse with occasional expiratory wheeze.  No rales or rhonchi.  ABDOMEN:  Soft, nontender, nondistended with positive bowel sounds. EXTREMITIES:  Warm, dry, and without edema, 2+ radial pulses bilateral and equal.  He has 1+ lower extremity pulses bilaterally. NEUROLOGIC:  He is alert and oriented x3 and responds to questions appropriately with a normal affect.  He  has no focal deficits.  He moves all extremities spontaneously.  ASSESSMENT/PLAN:  The patient seen and examined by Dr. Burt Knack and myself.  Mr. Lewan is a 65 year old gentleman with a history of diabetes, hypertension, deep venous thrombosis/pulmonary embolism, sleep apnea but no prior cardiac history presents with an episode of chest pain/nausea/diaphoresis worrisome for acute coronary syndrome versus alternatively dissection and less likely pulmonary embolism.  He is not tachycardic or tachypneic and O2 sats are currently stable.  His blood pressures are relatively equal in both arms and distal pulses are  equal, we are currently awaiting on chest x-ray report from [facility]. Continue his aspirin and statin for now.  No beta-blocker secondary to asthmatic bronchitis and baseline bradycardia.  We will transfer him to CCU.  In regards to his hypertension, his blood pressure has already been lower greater than 15% from presentation blood pressure.  He would benefit from gradual lowering in the next several hours.  ADDENDUM:  I just received report from [facility] that the chest x-ray showed cardiomegaly with slight widening of the mediastinum, recommendation to correlate with CT.  We will try to expedite CT angio as soon as possible.  I have discussed the patient with Dr. Burt Knack and will continue heparin for now but try to get him down to CT as soon as possible.     Melina Copa, P.A.C.   ______________________________ Juanda Bond. Burt Knack, MD    DD/MEDQ  D:  11/09/2010  T:  11/09/2010  Job:  [id number]  cc:   Juanda Bond. Burt Knack, MD Rigoberto Noel, MD Sean A. Loanne Drilling, MD  Electronically Signed by Sherren Mocha MD on 11/11/2010 10:09:14 PM Electronically Signed by Melina Copa  on 11/12/2010 08:09:10 AM

## 2010-11-12 DIAGNOSIS — I2510 Atherosclerotic heart disease of native coronary artery without angina pectoris: Secondary | ICD-10-CM

## 2010-11-12 LAB — CBC
Hemoglobin: 11.4 g/dL — ABNORMAL LOW (ref 13.0–17.0)
RBC: 4.12 MIL/uL — ABNORMAL LOW (ref 4.22–5.81)
WBC: 10.1 10*3/uL (ref 4.0–10.5)

## 2010-11-12 LAB — GLUCOSE, CAPILLARY
Glucose-Capillary: 153 mg/dL — ABNORMAL HIGH (ref 70–99)
Glucose-Capillary: 179 mg/dL — ABNORMAL HIGH (ref 70–99)
Glucose-Capillary: 184 mg/dL — ABNORMAL HIGH (ref 70–99)
Glucose-Capillary: 184 mg/dL — ABNORMAL HIGH (ref 70–99)
Glucose-Capillary: 241 mg/dL — ABNORMAL HIGH (ref 70–99)

## 2010-11-12 LAB — BASIC METABOLIC PANEL
CO2: 27 mEq/L (ref 19–32)
Calcium: 8.9 mg/dL (ref 8.4–10.5)
Chloride: 106 mEq/L (ref 96–112)
Sodium: 140 mEq/L (ref 135–145)

## 2010-11-12 LAB — HEPARIN LEVEL (UNFRACTIONATED)
Heparin Unfractionated: 0.42 IU/mL (ref 0.30–0.70)
Heparin Unfractionated: 0.51 IU/mL (ref 0.30–0.70)

## 2010-11-13 ENCOUNTER — Inpatient Hospital Stay (HOSPITAL_COMMUNITY): Payer: Medicare Other

## 2010-11-13 LAB — BASIC METABOLIC PANEL
BUN: 23 mg/dL (ref 6–23)
CO2: 25 mEq/L (ref 19–32)
Chloride: 103 mEq/L (ref 96–112)
GFR calc non Af Amer: 62 mL/min — ABNORMAL LOW (ref >90)
Glucose, Bld: 174 mg/dL — ABNORMAL HIGH (ref 70–99)
Potassium: 3.9 mEq/L (ref 3.5–5.1)

## 2010-11-13 LAB — GLUCOSE, CAPILLARY
Glucose-Capillary: 154 mg/dL — ABNORMAL HIGH (ref 70–99)
Glucose-Capillary: 54 mg/dL — ABNORMAL LOW (ref 70–99)
Glucose-Capillary: 81 mg/dL (ref 70–99)

## 2010-11-13 LAB — CBC
HCT: 32.9 % — ABNORMAL LOW (ref 39.0–52.0)
Hemoglobin: 11 g/dL — ABNORMAL LOW (ref 13.0–17.0)
MCHC: 33.4 g/dL (ref 30.0–36.0)

## 2010-11-17 NOTE — Cardiovascular Report (Signed)
NAMEMarland Kitchen  JULIANN, KEELEY NO.:  [id number]  MEDICAL RECORD NO.:  [id number]  LOCATION:  2008                         FACILITY:  [facility]  PHYSICIAN:  Loretha Brasil. Lia Foyer, MD, FACCDATE OF BIRTH:  10/03/1945  DATE OF PROCEDURE:  11/12/2010 DATE OF DISCHARGE:                           CARDIAC CATHETERIZATION   INDICATIONS:  This gentleman presented with chest pain.  He has had a known prior pulmonary embolus.  His blood pressure was extremely high. CT angio was negative for PE or dissection.  There was questionable troponins.  He was seen in consultation by Cardiology, and Dr. Rayann Heman recommended cardiac catheterization.  I met with the patient beforehand and risks, benefits, and alternatives were discussed with the patient. Questions were answered.  He was brought to the lab for further evaluation.  PROCEDURE: 1. Left heart catheterization. 2. Selective coronary arteriography.  DESCRIPTION OF PROCEDURE:  The patient was brought to the cath lab, prepped and draped in the usual fashion.  We used the right radial approach.  Using a short Cook needle, the right radial artery was entered.  Verapamil 3 mg and 150 mcg of nitroglycerin were administered. Intravenous heparin of 5000 unit was given.  Angiographic views of the left and right coronary arteries were obtained.  Ventriculography was not performed in order to minimize contrast load.  Ventricular pressures were recorded demonstrating a higher LVEDP.  The procedure was completed without complication.  TR band was placed.  HEMODYNAMIC DATA: 1. Central aortic pressure was 129/64, mean of 90 after administration     of hydralazine, and the intraarterial drugs. 2. LV pressure 144/33. 3. There was no gradient or pullback across the aortic valve.  ANGIOGRAPHIC DATA: 1. The right coronary artery was injected first.  There was some     calcification noted proximally.  There was about the acute margin     approximately 40%  area of eccentric plaquing but not high-grade     narrowing.  Posterior descending and posterolateral branches were     widely patent with minimal luminal irregularity. 2. The left main coronary artery has some mild tapering at the ostium     with about 20% narrowing. 3. The left anterior descending artery courses to the apex and has a     large bifurcating marginal branch.  The LAD bifurcates at the apex.     There is mild luminal irregularity of the LAD diagonal system     without critical narrowing. 4. The circumflex has about 20-30% proximal narrowing.  Then     terminates as 2 large marginal branches with mild luminal     irregularity.  There is some mild calcification.  CONCLUSIONS: 1. No critical high-grade coronary obstruction despite the patient's     known diabetes mellitus. 2. Elevated left ventricular end-diastolic pressure.  DISPOSITION: 1. We will get a 2D echocardiogram. 2. The patient be treated conservatively with blood pressure     reduction.     Loretha Brasil. Lia Foyer, MD, [facility]     TDS/MEDQ  D:  11/12/2010  T:  11/12/2010  Job:  [id number]  cc:   Loretha Brasil. Lia Foyer, MD, [facility] CV Laboratory Cooper Render. Pool, M.D. Sean A. Loanne Drilling, MD  Juanda Bond. Burt Knack, MD  Electronically Signed by Bing Quarry MD [facility] on 11/17/2010 09:14:34 AM

## 2010-11-17 NOTE — Discharge Summary (Signed)
NAMEMarland Kitchen  Donald [name], Donald [name] NO.:  [id number]  MEDICAL RECORD NO.:  [id number]  LOCATION:  2008                         FACILITY:  [facility]  PHYSICIAN:  Kathie Dike, MD     DATE OF BIRTH:  11-Sep-1945  DATE OF ADMISSION:  11/09/2010 DATE OF DISCHARGE:  11/13/2010                              DISCHARGE SUMMARY   PRIMARY CARE PHYSICIAN:  Sean A. Loanne Drilling, MD  DISCHARGE DIAGNOSES: 1. Chest pain, possibly related to hypertensive urgency, ruled out for     acute coronary syndrome and pulmonary embolus. 2. Insulin-dependent diabetes. 3. Uncontrolled hypertension. 4. Hyperlipidemia. 5. Chronic kidney disease stage 2. 6. History of gastroesophageal reflux disease. 7. History of low back pain. 8. Obstructive sleep apnea. 9. History of pulmonary embolism.  DISCHARGE MEDICATIONS: 1. Coreg 6.25 mg p.o. b.i.d. 2. Albuterol 1 nebulization inhaled every 4 hours as needed, 2.5 mg     for wheezing. 3. Xanax 2 mg quarter-tablet by mouth daily. 4. Aspirin 81 mg by mouth daily. 5. Vitamin B12 injection subcutaneously every 3 months. 6. Famotidine 10 mg 1 tablet by mouth daily at bedtime. 7. Lasix 80 mg 1 tablet by mouth twice daily. 8. Humalog 160 units every morning, 100 units at lunch, and 220 units     in the evening subcutaneously 3 times a day with meals. 9. Insulin NPH 340 units subcutaneously daily at bedtime. 10.Hydrocodone/APAP 10/325 mg 1 tablet by mouth every 4 hours as     needed. 11.Losartan 100 mg 1 tablet by mouth daily. 12.Lovastatin 40 mg 2 tablets by mouth twice daily. 13.Coreg 6.25 mg by mouth twice daily. 14.Metformin XR 500 mg 2 tablets by mouth twice daily, to be started     on November 15, 2010.  MEDICATIONS STOPPED IN THE HOSPITAL:  Diltiazem CD 120 mg 1 capsule by mouth daily.  ADMISSION HISTORY:  This is a 65 year old gentleman who has multiple medical problems.  He presents to the emergency room with complaints of chest pain, which started in a  bandlike fashion that radiated from his left breast to his back while seated.  Patient had associated diaphoresis.  He presented to [facility] Emergency Department and the family requested transfer to [facility].  He was subsequently admitted to the hospital for further evaluation.  For details, please refer to history and physical per Dr. Reece Levy on November 09, 2010.  HOSPITAL COURSE: 1. Chest pain.  The patient was initially admitted with the concern     for unstable angina.  He also did have CT angio of the chest done     which ruled out any aortic dissection.  He was continued on heparin     drip and subsequently underwent cardiac catheterization which     showed no critical high-grade coronary obstruction despite the     patient has known diabetes mellitus, elevated left ventricular end-     diastolic pressure.  He also had a 2D echocardiogram done which did     not show any wall motion defects.  It was felt that patient's chest     pain is likely noncardiac in nature.  Of note, his blood pressure     on admission was over  200.  It is possible that his chest pain may     be secondary to hypertensive urgency that has since resolved and he     no longer has any pain.  The patient has been cleared by Cardiology     for discharge for further followup as an outpatient. 2. Diabetes.  The patient is on large doses of insulin and he is     followed by an endocrinologist.  He reports that the sugars ranged     from 100-250 at home.  We will recommend that he continue this     regimen at home and have further followup with his primary care     physician, who is also his endocrinologist.  The remainder of the patient's medical issues have remained stable.  His blood pressure is also under better control.  CONSULTATIONS:  [facility].  DIAGNOSTIC IMAGING: 1. CT angio of the chest negative for pulmonary embolus.  Findings     consistent with the fibrous band in the right main pulmonary  artery     from prior PE, appearance is unchanged back to 2010.  CT abdomen     and pelvis shows it is negative for aortic dissection or aneurysm,     nonobstructing staghorn calculus, lower pole left kidney, single     small gallstone is noted.  No evidence of cholecystitis,     infiltration, or subcutaneous fat into the anterior abdomen could     be related to medicine, injection, or possibly cellulitis, fatty     infiltration of the liver, mild diverticulosis without     diverticulitis. 2. Chest x-ray on November 13, 2010, shows cardiac enlargement, no     acute cardiopulmonary abnormalities. 3. A 2D echocardiogram shows ejection fraction of 60%, wall motion was     normal.  No regional wall motion abnormalities.  PROCEDURES:  Cardiac catheterizations done by Dr. Lia Foyer shows no critical high-grade coronary obstruction despite the patient has known diabetes mellitus, elevated left ventricular end-diastolic pressure.  DISCHARGE INSTRUCTIONS:  The patient should follow up with his primary care physician in 1 week.  He will also be scheduled to follow up with [facility] in the next 2-3 weeks.  He should continue on a heart- healthy, low-calorie diet.  He will be set up with outpatient diabetes education.  ACTIVITY:  Should be as tolerated.  CONDITION AT TIME OF DISCHARGE:  Improved.     Kathie Dike, MD     JM/MEDQ  D:  11/13/2010  T:  11/13/2010  Job:  [id number]  cc:   Hilliard Clark A. Loanne Drilling, MD  Electronically Signed by Kathie Dike  on 11/17/2010 02:16:44 PM

## 2010-11-19 ENCOUNTER — Ambulatory Visit (INDEPENDENT_AMBULATORY_CARE_PROVIDER_SITE_OTHER): Payer: Medicare Other | Admitting: Endocrinology

## 2010-11-19 ENCOUNTER — Encounter: Payer: Self-pay | Admitting: Endocrinology

## 2010-11-19 VITALS — BP 126/68 | HR 54 | Temp 99.0°F | Ht 67.0 in | Wt 279.8 lb

## 2010-11-19 DIAGNOSIS — I2510 Atherosclerotic heart disease of native coronary artery without angina pectoris: Secondary | ICD-10-CM | POA: Insufficient documentation

## 2010-11-19 DIAGNOSIS — E119 Type 2 diabetes mellitus without complications: Secondary | ICD-10-CM

## 2010-11-19 DIAGNOSIS — F411 Generalized anxiety disorder: Secondary | ICD-10-CM

## 2010-11-19 DIAGNOSIS — I10 Essential (primary) hypertension: Secondary | ICD-10-CM

## 2010-11-19 DIAGNOSIS — R609 Edema, unspecified: Secondary | ICD-10-CM

## 2010-11-19 MED ORDER — ALPRAZOLAM 0.25 MG PO TABS: 0.2500 mg | ORAL_TABLET | Freq: Three times a day (TID) | ORAL | Status: AC | PRN

## 2010-11-19 NOTE — Progress Notes (Signed)
Subjective:    Patient ID: Donald [name], male    DOB: 1945/08/28, 65 y.o.   MRN: [id number]  HPI The state of at least three ongoing medical problems is addressed today: HTN: Pt was recently hospitalized for hypertensive urgency.  He attributes it to epidural injection, but he is not sure.   DM: since the hospitalization, he has found that his insulin requirement has decreased.  no cbg record, but states cbg's are well-controlled Edema: he takes lasix only qd.  Edema persists Anxiety: he says xanax makes him too drowsy.  Past Medical History  Diagnosis Date  . DIABETES MELLITUS, TYPE II 07/26/2006  . DYSLIPIDEMIA 04/13/2009  . ANEMIA-IRON DEFICIENCY 07/26/2006  . Pernicious anemia 11/20/2006  . DEPRESSION 03/14/2009  . PERIPHERAL NEUROPATHY 07/26/2006  . HYPERTENSION 07/26/2006  . PULMONARY EMBOLISM 10/05/2007  . D V T 12/03/2007  . ASTHMA 07/26/2006  . GERD 07/26/2006  . [place] DISEASE, LUMBAR 10/05/2007  . INSOMNIA 08/21/2007  . OBSTRUCTIVE SLEEP APNEA 12/03/2007  . COLONIC POLYPS, HX OF 07/26/2006    Past Surgical History  Procedure Date  . Appendectomy 1968  . Back surgery 1977, 06/14/2008    Dr. Trenton Gammon at [facility] (06/10)    History   Social History  . Marital Status: Married    Spouse Name: N/A    Number of Children: N/A  . Years of Education: N/A   Occupational History  . Disabled    Social History Main Topics  . Smoking status: Never Smoker   . Smokeless tobacco: Not on file  . Alcohol Use: No  . Drug Use: No  . Sexually Active:    Other Topics Concern  . 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 albuterol (PROVENTIL) (2.5 MG/3ML) 0.083% nebulizer solution Take 2.5 mg by nebulization every 4 (four) hours  as needed. For wheezing       . cyanocobalamin (,VITAMIN B-12,) 1000 MCG/ML injection 1 injection every 3 months       . famotidine (PEPCID AC) 10 MG chewable tablet Chew 10 mg by mouth at bedtime.        . furosemide (LASIX) 80 MG tablet Take 80 mg by mouth 2 (two) times daily.        Marland Kitchen HYDROcodone-acetaminophen (NORCO) 10-325 MG per tablet Take 1 tablet by mouth every 4 (four) hours as needed. For pain       . insulin lispro (HUMALOG) 100 UNIT/ML injection Three times daily (just before each meal) 160-100-220  140 mL  3  . insulin NPH (HUMULIN N,NOVOLIN N) 100 UNIT/ML injection Inject 380 Units into the skin at bedtime.        Marland Kitchen losartan (COZAAR) 100 MG tablet Take 100 mg by mouth daily.        Marland Kitchen lovastatin (MEVACOR) 40 MG tablet 2 tablets by mouth two times a day       . tadalafil (CIALIS) 20 MG tablet as needed.          Allergies  Allergen Reactions  . Advicor   . Codeine     REACTION: chest pain  . Pioglitazone     Family History  Problem Relation Age of Onset  . Cancer Mother     Breast Cancer  . Cancer Sister  Breast Cancer    BP 126/68  Pulse 54  Temp(Src) 99 F (37.2 C) (Oral)  Ht 5\' 7"  (1.702 m)  Wt 279 lb 12.8 oz (126.916 kg)  BMI 43.82 kg/m2  SpO2 97%  Review of Systems denies hypoglycemia and loc    Objective:   Physical Exam VITAL SIGNS:  See vs page GENERAL: no distress.  Obese Ext: 2+ bilat leg edema PSYCH: Alert and oriented x 3.  Does not appear anxious nor depressed.       Assessment & Plan:  DM, with decreased insulin requirement due to hospitalization. Hypertensive urgency, uncertain etiology, better now.   Anxiety, overcontrolled Edema, uncertain etiology

## 2010-11-19 NOTE — Patient Instructions (Addendum)
check your blood sugar 2 times a day.  vary the time of day when you check, between before the 3 meals, and at bedtime.  also check if you have symptoms of your blood sugar being too high or too low.  please keep a record of the readings and bring it to your next appointment here.  please call us sooner if your blood sugar goes below 70, or if it stays over 200. Yo will find that you need to gradually increase your insulin back to what it was prior to your hospitalization.   Please take the furosemide 2x a day. Please come back for a "medicare wellness" appointment after feb 12. In my opinion, it is ok to go ahead with the next epidural injection. Reduce alprazolam to 0.25 mg 3x a day as needed for anxiety or sleep.

## 2010-11-29 ENCOUNTER — Other Ambulatory Visit: Payer: Self-pay | Admitting: Endocrinology

## 2010-12-10 ENCOUNTER — Telehealth: Payer: Self-pay | Admitting: Cardiovascular Disease

## 2010-12-10 ENCOUNTER — Ambulatory Visit: Payer: Medicare Other | Admitting: Physician Assistant

## 2010-12-10 ENCOUNTER — Encounter: Payer: Self-pay | Admitting: Physician Assistant

## 2010-12-10 ENCOUNTER — Ambulatory Visit (INDEPENDENT_AMBULATORY_CARE_PROVIDER_SITE_OTHER): Payer: Medicare Other | Admitting: Physician Assistant

## 2010-12-10 ENCOUNTER — Encounter: Payer: Self-pay | Admitting: *Deleted

## 2010-12-10 DIAGNOSIS — I2699 Other pulmonary embolism without acute cor pulmonale: Secondary | ICD-10-CM

## 2010-12-10 DIAGNOSIS — I10 Essential (primary) hypertension: Secondary | ICD-10-CM

## 2010-12-10 DIAGNOSIS — E669 Obesity, unspecified: Secondary | ICD-10-CM

## 2010-12-10 DIAGNOSIS — I2510 Atherosclerotic heart disease of native coronary artery without angina pectoris: Secondary | ICD-10-CM

## 2010-12-10 NOTE — Progress Notes (Signed)
HPI:  This is a 65 year old male patient who was recently discharged from the hospital after admission with hypertensive urgency. He had a CT angiogram chest which was negative for pulmonary embolus and findings were consistent with fibrous band in the right main pulmonary artery from a prior PE. CT of his abdomen and pelvis was negative for aortic dissection. 2-D echo showed ejection fraction of 60% with normal wall motion. Cardiac catheter 11/12/10 showed nonobstructive coronary disease and elevated left ventricular end-diastolic pressure.  Since the patient's been home he has lost 8 pounds, he is watching his diet closely, and [facility]. He does have chronic back issues requiring steroid injections and walks with a cane.  Patient denies any further chest pain, palpitations, dyspnea, dyspnea on exertion, dizziness, or presyncope. His blood pressure has been well-controlled at home.   Allergies  Allergen Reactions  . Advicor   . Codeine     REACTION: chest pain  . Pioglitazone     Current Outpatient Prescriptions on File Prior to Visit  Medication Sig Dispense Refill  . albuterol (PROVENTIL) (2.5 MG/3ML) 0.083% nebulizer solution Take 2.5 mg by nebulization every 4 (four) hours as needed. For wheezing       . ALPRAZolam (XANAX) 0.25 MG tablet Take 1 tablet (0.25 mg total) by mouth 3 (three) times daily as needed for sleep or anxiety.  50 tablet  2  . carvedilol (COREG) 6.25 MG tablet TAKE 1 TABLET TWICE A DAY WITH MEALS  60 tablet  5  . cyanocobalamin (,VITAMIN B-12,) 1000 MCG/ML injection 1 injection every 3 months       . famotidine (PEPCID AC) 10 MG chewable tablet Chew 10 mg by mouth at bedtime.        . furosemide (LASIX) 80 MG tablet Take 80 mg by mouth 2 (two) times daily.        Marland Kitchen HYDROcodone-acetaminophen (NORCO) 10-325 MG per tablet Take 1 tablet by mouth every 4 (four) hours as needed. For pain       . insulin  lispro (HUMALOG) 100 UNIT/ML injection Three times daily (just before each meal) 160-100-220  140 mL  3  . insulin NPH (HUMULIN N,NOVOLIN N) 100 UNIT/ML injection Inject 380 Units into the skin at bedtime.        Marland Kitchen losartan (COZAAR) 100 MG tablet Take 100 mg by mouth daily.        Marland Kitchen lovastatin (MEVACOR) 40 MG tablet 2 tablets by mouth two times a day       . tadalafil (CIALIS) 20 MG tablet as needed.          Past Medical History  Diagnosis Date  . DIABETES MELLITUS, TYPE II 07/26/2006  . DYSLIPIDEMIA 04/13/2009  . ANEMIA-IRON DEFICIENCY 07/26/2006  . Pernicious anemia 11/20/2006  . DEPRESSION 03/14/2009  . PERIPHERAL NEUROPATHY 07/26/2006  . HYPERTENSION 07/26/2006  . PULMONARY EMBOLISM 10/05/2007  . D V T 12/03/2007  . ASTHMA 07/26/2006  . GERD 07/26/2006  . [place] DISEASE, LUMBAR 10/05/2007  . INSOMNIA 08/21/2007  . OBSTRUCTIVE SLEEP APNEA 12/03/2007  . COLONIC POLYPS, HX OF 07/26/2006    Past Surgical History  Procedure Date  . Appendectomy 1968  . Back surgery 1977, 06/14/2008    Dr. Trenton Gammon at [facility] (06/10)    Family History  Problem Relation Age of Onset  . Cancer Mother     Breast Cancer  . Cancer Sister     Breast  Cancer    History   Social History  . Marital Status: Married    Spouse Name: N/A    Number of Children: N/A  . Years of Education: N/A   Occupational History  . Disabled    Social History Main Topics  . Smoking status: Never Smoker   . Smokeless tobacco: Not on file  . Alcohol Use: No  . Drug Use: No  . Sexually Active:    Other Topics Concern  . 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  Respiratory:   Negative for cough, hemoptysis, shortness of breath, sleep disturbances due to breathing, sputum production and wheezing.   Endocrine: Negative for cold intolerance and heat intolerance.  Hematologic/Lymphatic: Negative for adenopathy and bleeding problem. Does not bruise/bleed easily.  Musculoskeletal: chronic back pain treated with steroid injections, walks with a cane.   Gastrointestinal: Negative for nausea, vomiting, reflux, abdominal pain, diarrhea, constipation.   Neurological: Negative.  Allergic/Immunologic: Negative for environmental allergies.   PHYSICAL EXAM: Obese, in no acute distress. Neck: No JVD, HJR, Bruit, or thyroid enlargement Lungs: No tachypnea, clear without wheezing, rales, or rhonchi Cardiovascular: RRR, PMI not displaced, heart sounds normal, no murmurs, gallops, bruit, thrill, or heave. Abdomen: BS normal. Soft without organomegaly, masses, lesions or tenderness. Extremities: right arm at catheter site without hematoma or hemorrhage, good radial ulnar pulse, lower extremities without cyanosis, clubbing or edema. Good distal pulses bilateral SKin: Warm, no lesions or rashes  Musculoskeletal: No deformities Neuro: no focal signs  BP 124/50  Pulse 48  Ht 5\' 7"  (1.702 m)  Wt 280 lb (127.007 kg)  BMI 43.85 kg/m2

## 2010-12-10 NOTE — Assessment & Plan Note (Signed)
Patient had nonobstructive coronary disease on cardiac catheterization. He denies any further chest pain.

## 2010-12-10 NOTE — Patient Instructions (Addendum)
Your physician recommends that you schedule a follow-up appointment as needed.  Follow a 2 gram sodium diet.    2 Gram Low Sodium Diet A 2 gram sodium diet restricts the amount of sodium in the diet to no more than 2 g or 2000 mg daily. Limiting the amount of sodium is often used to help lower blood pressure. It is important if you have heart, liver, or kidney problems. Many foods contain sodium for flavor and sometimes as a preservative. When the amount of sodium in a diet needs to be low, it is important to know what to look for when choosing foods and drinks. The following includes some information and guidelines to help make it easier for you to adapt to a low sodium diet. QUICK TIPS  Do not add salt to food.   Avoid convenience items and fast food.   Choose unsalted snack foods.   Buy lower sodium products, often labeled as "lower sodium" or "no salt added."   Check food labels to learn how much sodium is in 1 serving.   When eating at a restaurant, ask that your food be prepared with less salt or none, if possible.  READING FOOD LABELS FOR SODIUM INFORMATION The nutrition facts label is a good place to find how much sodium is in foods. Look for products with no more than 500 to 600 mg of sodium per meal and no more than 150 mg per serving. Remember that 2 g = 2000 mg. The food label may also list foods as:  Sodium-free: Less than 5 mg in a serving.   Very low sodium: 35 mg or less in a serving.   Low-sodium: 140 mg or less in a serving.   Light in sodium: 50% less sodium in a serving. For example, if a food that usually has 300 mg of sodium is changed to become light in sodium, it will have 150 mg of sodium.   Reduced sodium: 25% less sodium in a serving. For example, if a food that usually has 400 mg of sodium is changed to reduced sodium, it will have 300 mg of sodium.  CHOOSING FOODS Grains  Avoid: Salted crackers and snack items. Some cereals, including instant hot  cereals. Bread stuffing and biscuit mixes. Seasoned rice or pasta mixes.   Choose: Unsalted snack items. Low-sodium cereals, oats, puffed wheat and rice, shredded wheat. English muffins and bread. Pasta.  Meats  Avoid: Salted, canned, smoked, spiced, pickled meats, including fish and poultry. Bacon, ham, sausage, cold cuts, hot dogs, anchovies.   Choose: Low-sodium canned tuna and salmon. Fresh or frozen meat, poultry, and fish.  Dairy  Avoid: Processed cheese and spreads. Cottage cheese. Buttermilk and condensed milk. Regular cheese.   Choose: Milk. Low-sodium cottage cheese. Yogurt. Sour cream. Low-sodium cheese.  Fruits and Vegetables  Avoid: Regular canned vegetables. Regular canned tomato sauce and paste. Frozen vegetables in sauces. Olives. Angie Fava. Relishes. Sauerkraut.   Choose: Low-sodium canned vegetables. Low-sodium tomato sauce and paste. Frozen or fresh vegetables. Fresh and frozen fruit.  Condiments  Avoid: Canned and packaged gravies. Worcestershire sauce. Tartar sauce. Barbecue sauce. Soy sauce. Steak sauce. Ketchup. Onion, garlic, and table salt. Meat flavorings and tenderizers.   Choose: Fresh and dried herbs and spices. Low-sodium varieties of mustard and ketchup. Lemon juice. Tabasco sauce. Horseradish.  SAMPLE 2 GRAM SODIUM MEAL PLAN Breakfast / Sodium (mg)  1 cup low-fat milk / [id number] mg   2 slices whole-wheat toast / 270 mg   1  tbs heart-healthy margarine / 153 mg   1 hard-boiled egg / 139 mg   1 small orange / 0 mg  Lunch / Sodium (mg)  1 cup raw carrots / 76 mg    cup hummus / 298 mg   1 cup low-fat milk / 143 mg    cup red grapes / 2 mg   1 whole-wheat pita bread / 356 mg  Dinner / Sodium (mg)  1 cup whole-wheat pasta / 2 mg   1 cup low-sodium tomato sauce / 73 mg   3 oz lean ground beef / 57 mg   1 small side salad (1 cup raw spinach leaves,  cup cucumber,  cup yellow bell pepper) with 1 tsp olive oil and 1 tsp red wine vinegar / 25 mg   Snack / Sodium (mg)  1 container low-fat vanilla yogurt / 107 mg   3 graham cracker squares / 127 mg  Nutrient Analysis  Calories: 2033   Protein: 77 g   Carbohydrate: 282 g   Fat: 72 g   Sodium: 1971 mg  Document Released: 12/31/2004 Document Revised: 09/12/2010 Document Reviewed: 04/03/2009 [place] Patient Information 2012 [place], [place].  You have been cleared from a cardiology standpoint to begin an exercise program at [facility].

## 2010-12-10 NOTE — Assessment & Plan Note (Signed)
Patient has started a weight loss program by watching his portion sizes. I encouraged him to continue with this as well as start an exercise program. [facility]. I told him he is cleared from a cardiac standpoint but will have to be cleared from his Orthopedist.

## 2010-12-10 NOTE — Telephone Encounter (Addendum)
Walk in pt Form " Pt dropped off [employer] papers for completion" sent to [facility] 12/10/10/km  Insurance papers given back to me, sent to [facility] 12/11/10/km

## 2010-12-10 NOTE — Assessment & Plan Note (Addendum)
Patient had admission to the hospital with hypertensive urgency with systolic blood pressures over 200. His chest pain was felt secondary to hypertensive urgency. He had elevated left ventricular end left diastolic pressure on cardiac catheter.  Patient's blood pressure is well controlled today. I will hold off on increasing his Coreg today. He has lost 8 pounds and is motivated to continue on a weight loss program and begin an exercise program. He should continue to monitor his blood pressures at home. Dr. Loanne Drilling to follow him for this.

## 2010-12-17 ENCOUNTER — Telehealth: Payer: Self-pay | Admitting: Endocrinology

## 2010-12-17 NOTE — Telephone Encounter (Signed)
Pt states that he is under your care. He has been disabled since June 2009 from back surgery and was recently hospitalized from 11/08/2010-11/10/2010 for a cardiac event.

## 2010-12-17 NOTE — Telephone Encounter (Signed)
i have received disability form. For what condition (s) are you disabled? Under whose care are you for this (or these?).

## 2010-12-19 ENCOUNTER — Other Ambulatory Visit: Payer: Self-pay | Admitting: *Deleted

## 2010-12-19 MED ORDER — LOSARTAN POTASSIUM 100 MG PO TABS: 100.0000 mg | ORAL_TABLET | Freq: Every day | ORAL | Status: DC

## 2010-12-19 NOTE — Telephone Encounter (Signed)
Pt needs refill of Losartan sent to [facility] in [place]. Pt informed rx sent to pharmacy.

## 2010-12-21 DIAGNOSIS — Z0279 Encounter for issue of other medical certificate: Secondary | ICD-10-CM

## 2011-01-11 ENCOUNTER — Telehealth: Payer: Self-pay | Admitting: Pulmonary Disease

## 2011-01-11 NOTE — Telephone Encounter (Signed)
ATC and line was busy x 2- WCB

## 2011-01-14 NOTE — Telephone Encounter (Signed)
Spoke with pt and he states that he is now taking robitussin and tylenol for his cold as recommended per his pharmacist and now he is feeling better.

## 2011-01-17 DIAGNOSIS — IMO0001 Reserved for inherently not codable concepts without codable children: Secondary | ICD-10-CM | POA: Diagnosis not present

## 2011-02-08 ENCOUNTER — Ambulatory Visit: Payer: Medicare Other | Admitting: Endocrinology

## 2011-02-16 ENCOUNTER — Other Ambulatory Visit: Payer: Self-pay | Admitting: Endocrinology

## 2011-02-27 ENCOUNTER — Encounter: Payer: Medicare Other | Admitting: Endocrinology

## 2011-03-11 ENCOUNTER — Other Ambulatory Visit (INDEPENDENT_AMBULATORY_CARE_PROVIDER_SITE_OTHER): Payer: Medicare Other

## 2011-03-11 ENCOUNTER — Encounter: Payer: Self-pay | Admitting: Endocrinology

## 2011-03-11 ENCOUNTER — Ambulatory Visit (INDEPENDENT_AMBULATORY_CARE_PROVIDER_SITE_OTHER): Payer: Medicare Other | Admitting: Endocrinology

## 2011-03-11 DIAGNOSIS — E119 Type 2 diabetes mellitus without complications: Secondary | ICD-10-CM

## 2011-03-11 DIAGNOSIS — G609 Hereditary and idiopathic neuropathy, unspecified: Secondary | ICD-10-CM

## 2011-03-11 DIAGNOSIS — E1049 Type 1 diabetes mellitus with other diabetic neurological complication: Secondary | ICD-10-CM

## 2011-03-11 DIAGNOSIS — E1065 Type 1 diabetes mellitus with hyperglycemia: Secondary | ICD-10-CM

## 2011-03-11 DIAGNOSIS — N529 Male erectile dysfunction, unspecified: Secondary | ICD-10-CM

## 2011-03-11 DIAGNOSIS — D510 Vitamin B12 deficiency anemia due to intrinsic factor deficiency: Secondary | ICD-10-CM

## 2011-03-11 DIAGNOSIS — R809 Proteinuria, unspecified: Secondary | ICD-10-CM | POA: Diagnosis not present

## 2011-03-11 DIAGNOSIS — Z0000 Encounter for general adult medical examination without abnormal findings: Secondary | ICD-10-CM | POA: Diagnosis not present

## 2011-03-11 DIAGNOSIS — D509 Iron deficiency anemia, unspecified: Secondary | ICD-10-CM

## 2011-03-11 LAB — TSH: TSH: 2.15 u[IU]/mL (ref 0.35–5.50)

## 2011-03-11 LAB — MICROALBUMIN / CREATININE URINE RATIO
Microalb Creat Ratio: 6.7 mg/g (ref 0.0–30.0)
Microalb, Ur: 12.5 mg/dL — ABNORMAL HIGH (ref 0.0–1.9)

## 2011-03-11 LAB — CBC WITH DIFFERENTIAL/PLATELET
Basophils Relative: 0.5 % (ref 0.0–3.0)
Eosinophils Absolute: 0.3 10*3/uL (ref 0.0–0.7)
Eosinophils Relative: 3.1 % (ref 0.0–5.0)
Hemoglobin: 12.7 g/dL — ABNORMAL LOW (ref 13.0–17.0)
Lymphocytes Relative: 20.4 % (ref 12.0–46.0)
MCHC: 33.3 g/dL (ref 30.0–36.0)
Monocytes Relative: 6.7 % (ref 3.0–12.0)
Neutro Abs: 5.7 10*3/uL (ref 1.4–7.7)
Neutrophils Relative %: 69.3 % (ref 43.0–77.0)
RBC: 4.45 Mil/uL (ref 4.22–5.81)
WBC: 8.3 10*3/uL (ref 4.5–10.5)

## 2011-03-11 LAB — IBC PANEL
Saturation Ratios: 16.1 % — ABNORMAL LOW (ref 20.0–50.0)
Transferrin: 266.5 mg/dL (ref 212.0–360.0)

## 2011-03-11 LAB — URINALYSIS, ROUTINE W REFLEX MICROSCOPIC
Bilirubin Urine: NEGATIVE
Ketones, ur: NEGATIVE
Nitrite: NEGATIVE
Total Protein, Urine: 30

## 2011-03-11 LAB — TESTOSTERONE: Testosterone: 335.12 ng/dL — ABNORMAL LOW (ref 350.00–890.00)

## 2011-03-11 LAB — HEMOGLOBIN A1C: Hgb A1c MFr Bld: 8.2 % — ABNORMAL HIGH (ref 4.6–6.5)

## 2011-03-11 MED ORDER — HYDROCODONE-ACETAMINOPHEN 10-325 MG PO TABS: 1.0000 | ORAL_TABLET | ORAL | Status: DC | PRN

## 2011-03-11 MED ORDER — CYANOCOBALAMIN 1000 MCG/ML IJ SOLN
1000.0000 ug | Freq: Once | INTRAMUSCULAR | Status: AC
  Administered 2011-03-11: 1000 ug via INTRAMUSCULAR

## 2011-03-11 NOTE — Patient Instructions (Addendum)
We can't do psa blood test, due to a computer problem. please consider these measures for your health:  minimize alcohol.  do not use tobacco products.  have a colonoscopy at least every 10 years from age 64.  keep firearms safely stored.  always use seat belts.  have working smoke alarms in your home.  see an eye doctor and dentist regularly.  never drive under the influence of alcohol or drugs (including prescription drugs).  those with fair skin should take precautions against the sun. please let me know what your wishes would be, if artificial life support measures should become necessary.  it is critically important to prevent falling down (keep floor areas well-lit, dry, and free of loose objects.  If you have a cane, walker, or wheelchair, you should use it, even for short trips around the house.  Also, try not to rush). Please come back for a follow-up appointment in 3 months (update: i left message on phone-tree:  increase humalog to tid (qac) 170-110-230 units.  Take fe 1/day)

## 2011-03-11 NOTE — Progress Notes (Signed)
Subjective:    Patient ID: Donald [name], male    DOB: [birth year], 66 y.o.   MRN: [id number]  HPI ED sxs persist.he brings a record of his cbg's which i have reviewed today.  It was low once, in am.  Most are in the 100's.  There is otherwise no trend throughout the day. He denies brbpr Past Medical History  Diagnosis Date  . DIABETES MELLITUS, TYPE II 07/26/2006  . DYSLIPIDEMIA 04/13/2009  . ANEMIA-IRON DEFICIENCY 07/26/2006  . Pernicious anemia 11/20/2006  . DEPRESSION 03/14/2009  . PERIPHERAL NEUROPATHY 07/26/2006  . HYPERTENSION 07/26/2006  . PULMONARY EMBOLISM 10/05/2007  . D V T 12/03/2007  . ASTHMA 07/26/2006  . GERD 07/26/2006  . [place] DISEASE, LUMBAR 10/05/2007  . INSOMNIA 08/21/2007  . OBSTRUCTIVE SLEEP APNEA 12/03/2007  . COLONIC POLYPS, HX OF 07/26/2006    Past Surgical History  Procedure Date  . Appendectomy 1968  . Back surgery 1977, 06/14/2008    Dr. Trenton Gammon at [facility] (06/10)    History   Social History  . Marital Status: Married    Spouse Name: N/A    Number of Children: N/A  . Years of Education: N/A   Occupational History  . Disabled    Social History Main Topics  . Smoking status: Never Smoker   . Smokeless tobacco: Not on file  . Alcohol Use: No  . Drug Use: No  . Sexually Active:    Other Topics Concern  . 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 albuterol (PROVENTIL) (2.5 MG/3ML) 0.083% nebulizer solution Take 2.5 mg by nebulization every 4 (four) hours as needed. For wheezing       . carvedilol (COREG) 6.25 MG tablet TAKE 1 TABLET TWICE A DAY WITH MEALS  60 tablet  5  . cyanocobalamin (,VITAMIN B-12,) 1000 MCG/ML injection 1 injection every 3 months       . famotidine (PEPCID AC) 10 MG  chewable tablet Chew 10 mg by mouth at bedtime.        . furosemide (LASIX) 80 MG tablet Take 80 mg by mouth 2 (two) times daily.        Marland Kitchen HUMULIN N 100 UNIT/ML injection INJECT 340 UNITS INTO THE SKIN AT BEDTIME.  100 mL  3  . losartan (COZAAR) 100 MG tablet Take 1 tablet (100 mg total) by mouth daily.  90 tablet  3  . lovastatin (MEVACOR) 40 MG tablet 2 tablets by mouth two times a day       . tadalafil (CIALIS) 20 MG tablet as needed.          Allergies  Allergen Reactions  . Advicor   . Codeine     REACTION: chest pain  . Pioglitazone     Family History  Problem Relation Age of Onset  . Cancer Mother     Breast Cancer  . Cancer Sister     Breast Cancer    BP 116/62  Pulse 58  Temp(Src) 98.1 F (36.7 C) (Oral)  Ht 5\' 7"  (1.702 m)  Wt 284 lb 6.4 oz (129.003 kg)  BMI 44.54 kg/m2  SpO2 97%   Review of Systems  Constitutional: Negative for unexpected weight change.  Eyes: Negative for visual disturbance.  Cardiovascular: Negative for chest pain.  Objective:   Physical Exam VITAL SIGNS:  See vs page GENERAL: no distress.  Obese LUNGS:  Clear to auscultation HEART:  Regular rate and rhythm without murmurs noted. Normal S1,S2.   Pulses: dorsalis pedis intact bilat.   Feet: no deformity.  no ulcer on the feet.  feet are of normal color and temp.  1+ bilat leg edema Neuro: sensation is intact to touch on the feet  Lab Results  Component Value Date   WBC 8.3 03/11/2011   HGB 12.7* 03/11/2011   HCT 38.1* 03/11/2011   PLT 181.0 03/11/2011   GLUCOSE 174* 11/13/2010   CHOL 133 11/09/2010   TRIG 188* 11/09/2010   HDL 29* 11/09/2010   LDLDIRECT 52.2 05/25/2009   LDLCALC 66 11/09/2010   ALT 13 11/10/2010   AST 10 11/10/2010   NA 139 11/13/2010   K 3.9 11/13/2010   CL 103 11/13/2010   CREATININE 1.21 11/13/2010   BUN 23 11/13/2010   CO2 25 11/13/2010   TSH 2.15 03/11/2011   PSA 0.56 02/27/2010   INR 1.17 11/09/2010   HGBA1C 8.2* 03/11/2011   MICROALBUR 12.5*  03/11/2011      Assessment & Plan:  DM, needs increased rx fe-deficiency anemia, needs increased rx   Subjective:   Patient here for Medicare annual wellness visit and management of other chronic and acute problems.     Risk factors:  Mult health probs    Roster of Physicians Providing Medical Care to Patient: Opthal: [place] Neurosurg: pool   Activities of Daily Living: In your present state of health, do you have any difficulty performing the following activities?:  Preparing food and eating?: No  Bathing yourself: No  Getting dressed: No  Using the toilet: No  Moving around from place to place: No  In the past year have you fallen or had a near fall?: No    Home Safety: Has smoke detector and wears seat belts. No firearms. No excess sun exposure.  Diet and Exercise  Current exercise habits:  Limited by heal probs Dietary issues discussed:  Pt reports a healthy diet   Depression Screen  Q1: Over the past two weeks, have you felt down, depressed or hopeless? no  Q2: Over the past two weeks, have you felt little interest or pleasure in doing things? no   The following portions of the patient's history were reviewed and updated as appropriate: allergies, current medications, past family history, past medical history, past social history, past surgical history and problem list.  Past Medical History  Diagnosis Date  . DIABETES MELLITUS, TYPE II 07/26/2006  . DYSLIPIDEMIA 04/13/2009  . ANEMIA-IRON DEFICIENCY 07/26/2006  . Pernicious anemia 11/20/2006  . DEPRESSION 03/14/2009  . PERIPHERAL NEUROPATHY 07/26/2006  . HYPERTENSION 07/26/2006  . PULMONARY EMBOLISM 10/05/2007  . D V T 12/03/2007  . ASTHMA 07/26/2006  . GERD 07/26/2006  . [place] DISEASE, LUMBAR 10/05/2007  . INSOMNIA 08/21/2007  . OBSTRUCTIVE SLEEP APNEA 12/03/2007  . COLONIC POLYPS, HX OF 07/26/2006    Past Surgical History  Procedure Date  . Appendectomy 1968  . Back surgery 1977, 06/14/2008    Dr. Trenton Gammon at [facility] (06/10)    History   Social History  . Marital Status: Married    Spouse Name: N/A    Number of Children: N/A  . Years of Education: N/A   Occupational History  . Disabled    Social History Main Topics  . Smoking status: Never Smoker   . Smokeless tobacco: Not  on file  . Alcohol Use: No  . Drug Use: No  . Sexually Active:    Other Topics Concern  . 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 albuterol (PROVENTIL) (2.5 MG/3ML) 0.083% nebulizer solution Take 2.5 mg by nebulization every 4 (four) hours as needed. For wheezing       . carvedilol (COREG) 6.25 MG tablet TAKE 1 TABLET TWICE A DAY WITH MEALS  60 tablet  5  . cyanocobalamin (,VITAMIN B-12,) 1000 MCG/ML injection 1 injection every 3 months       . famotidine (PEPCID AC) 10 MG chewable tablet Chew 10 mg by mouth at bedtime.        . furosemide (LASIX) 80 MG tablet Take 80 mg by mouth 2 (two) times daily.        Marland Kitchen HUMULIN N 100 UNIT/ML injection INJECT 340 UNITS INTO THE SKIN AT BEDTIME.  100 mL  3  . insulin lispro (HUMALOG) 100 UNIT/ML injection Three times daily (just before each meal) 160-100-220  140 mL  3  . losartan (COZAAR) 100 MG tablet Take 1 tablet (100 mg total) by mouth daily.  90 tablet  3  . lovastatin (MEVACOR) 40 MG tablet 2 tablets by mouth two times a day       . tadalafil (CIALIS) 20 MG tablet as needed.          Allergies  Allergen Reactions  . Advicor   . Codeine     REACTION: chest pain  . Pioglitazone     Family History  Problem Relation Age of Onset  . Cancer Mother     Breast Cancer  . Cancer Sister     Breast Cancer    BP 116/62  Pulse 58  Temp(Src) 98.1 F (36.7 C) (Oral)  Ht 5\' 7"  (1.702 m)  Wt 284 lb 6.4 oz (129.003  kg)  BMI 44.54 kg/m2  SpO2 97%  Review of Systems  Denies hearing loss, and visual loss Objective:   Vision:  Sees opthalmologist Hearing: grossly normal Body mass index:  See vs page Msk: pt easily and quickly performs "get-up-and-go" from a sitting position Cognitive Impairment Assessment: cognition, memory and judgment appear normal.  remembers 3/3 at 5 minutes.  excellent recall.  can easily read and write a sentence.  alert and oriented x 3   Assessment:   Medicare wellness utd on preventive parameters    Plan:   During the course of the visit the patient was educated and counseled about appropriate screening and preventive services including:        Fall prevention  Diabetes screening  Nutrition counseling   Vaccines / LABS Zostavax / Pnemonccoal Vaccine  today  PSA  Patient Instructions (the written plan) was given to the patient.

## 2011-03-13 ENCOUNTER — Other Ambulatory Visit: Payer: Self-pay | Admitting: Endocrinology

## 2011-03-13 MED ORDER — INSULIN LISPRO 100 UNIT/ML ~~LOC~~ SOLN: SUBCUTANEOUS | Status: DC

## 2011-03-21 ENCOUNTER — Other Ambulatory Visit: Payer: Self-pay | Admitting: Endocrinology

## 2011-04-05 DIAGNOSIS — E119 Type 2 diabetes mellitus without complications: Secondary | ICD-10-CM | POA: Diagnosis not present

## 2011-04-09 ENCOUNTER — Other Ambulatory Visit: Payer: Self-pay | Admitting: Endocrinology

## 2011-06-06 ENCOUNTER — Encounter: Payer: Self-pay | Admitting: Endocrinology

## 2011-06-06 ENCOUNTER — Other Ambulatory Visit (INDEPENDENT_AMBULATORY_CARE_PROVIDER_SITE_OTHER): Payer: Medicare Other

## 2011-06-06 ENCOUNTER — Ambulatory Visit (INDEPENDENT_AMBULATORY_CARE_PROVIDER_SITE_OTHER): Payer: Medicare Other | Admitting: Endocrinology

## 2011-06-06 VITALS — BP 124/66 | HR 54 | Temp 97.8°F | Ht 67.0 in | Wt 284.0 lb

## 2011-06-06 DIAGNOSIS — E1149 Type 2 diabetes mellitus with other diabetic neurological complication: Secondary | ICD-10-CM | POA: Diagnosis not present

## 2011-06-06 DIAGNOSIS — D510 Vitamin B12 deficiency anemia due to intrinsic factor deficiency: Secondary | ICD-10-CM | POA: Diagnosis not present

## 2011-06-06 DIAGNOSIS — E1142 Type 2 diabetes mellitus with diabetic polyneuropathy: Secondary | ICD-10-CM | POA: Diagnosis not present

## 2011-06-06 DIAGNOSIS — N320 Bladder-neck obstruction: Secondary | ICD-10-CM

## 2011-06-06 DIAGNOSIS — E1065 Type 1 diabetes mellitus with hyperglycemia: Secondary | ICD-10-CM

## 2011-06-06 DIAGNOSIS — D509 Iron deficiency anemia, unspecified: Secondary | ICD-10-CM | POA: Diagnosis not present

## 2011-06-06 DIAGNOSIS — E1049 Type 1 diabetes mellitus with other diabetic neurological complication: Secondary | ICD-10-CM | POA: Diagnosis not present

## 2011-06-06 DIAGNOSIS — Z125 Encounter for screening for malignant neoplasm of prostate: Secondary | ICD-10-CM

## 2011-06-06 LAB — CBC WITH DIFFERENTIAL/PLATELET
Basophils Relative: 0.4 % (ref 0.0–3.0)
Eosinophils Relative: 3.5 % (ref 0.0–5.0)
Hemoglobin: 13 g/dL (ref 13.0–17.0)
Lymphocytes Relative: 20.7 % (ref 12.0–46.0)
MCHC: 32 g/dL (ref 30.0–36.0)
Monocytes Relative: 6.2 % (ref 3.0–12.0)
Neutro Abs: 5.5 10*3/uL (ref 1.4–7.7)
Neutrophils Relative %: 69.2 % (ref 43.0–77.0)
RBC: 4.67 Mil/uL (ref 4.22–5.81)
WBC: 7.9 10*3/uL (ref 4.5–10.5)

## 2011-06-06 LAB — PSA: PSA: 0.63 ng/mL (ref 0.10–4.00)

## 2011-06-06 LAB — HEMOGLOBIN A1C: Hgb A1c MFr Bld: 8 % — ABNORMAL HIGH (ref 4.6–6.5)

## 2011-06-06 LAB — IBC PANEL
Saturation Ratios: 17 % — ABNORMAL LOW (ref 20.0–50.0)
Transferrin: 268.2 mg/dL (ref 212.0–360.0)

## 2011-06-06 MED ORDER — CEFUROXIME AXETIL 250 MG PO TABS: 250.0000 mg | ORAL_TABLET | Freq: Two times a day (BID) | ORAL | Status: AC

## 2011-06-06 MED ORDER — ALPRAZOLAM 0.5 MG PO TABS: 0.5000 mg | ORAL_TABLET | Freq: Every evening | ORAL | Status: AC | PRN

## 2011-06-06 MED ORDER — CYANOCOBALAMIN 1000 MCG/ML IJ SOLN
1000.0000 ug | Freq: Once | INTRAMUSCULAR | Status: AC
  Administered 2011-06-06: 1000 ug via INTRAMUSCULAR

## 2011-06-06 NOTE — Progress Notes (Signed)
Subjective:    Patient ID: Donald [name], male    DOB: 1945-12-24, 66 y.o.   MRN: [id number]  HPI Pt returns for f/u of IDDM (dx'ed [id number]; complicated by peripheral sensory neuropathy, CAD, and nephropathy).  no cbg record, but states cbg's are well-controlled.  denies hypoglycemia.  It is highest in the afternoon.   Pt states few days of slight pain at the bifrontal areas, and assoc nasal congestion.   Pt says he takes fe 1/day. Xanax no longer helps insomnia. Past Medical History  Diagnosis Date  . DIABETES MELLITUS, TYPE II 07/26/2006  . DYSLIPIDEMIA 04/13/2009  . ANEMIA-IRON DEFICIENCY 07/26/2006  . Pernicious anemia 11/20/2006  . DEPRESSION 03/14/2009  . PERIPHERAL NEUROPATHY 07/26/2006  . HYPERTENSION 07/26/2006  . PULMONARY EMBOLISM 10/05/2007  . D V T 12/03/2007  . ASTHMA 07/26/2006  . GERD 07/26/2006  . [place] DISEASE, LUMBAR 10/05/2007  . INSOMNIA 08/21/2007  . OBSTRUCTIVE SLEEP APNEA 12/03/2007  . COLONIC POLYPS, HX OF 07/26/2006    Past Surgical History  Procedure Date  . Appendectomy 1968  . Back surgery 1977, 06/14/2008    Dr. Trenton Gammon at [facility] (06/10)    History   Social History  . Marital Status: Married    Spouse Name: N/A    Number of Children: N/A  . Years of Education: N/A   Occupational History  . Disabled    Social History Main Topics  . Smoking status: Never Smoker   . Smokeless tobacco: Not on file  . Alcohol Use: No  . Drug Use: No  . Sexually Active:    Other Topics Concern  . 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 albuterol (PROVENTIL) (2.5 MG/3ML) 0.083% nebulizer solution Take 2.5 mg by nebulization every 4 (four) hours as needed. For wheezing       . carvedilol (COREG) 6.25 MG  tablet TAKE 1 TABLET TWICE A DAY WITH MEALS  60 tablet  5  . cyanocobalamin (,VITAMIN B-12,) 1000 MCG/ML injection 1 injection every 3 months       . famotidine (PEPCID AC) 10 MG chewable tablet Chew 10 mg by mouth at bedtime.        . furosemide (LASIX) 80 MG tablet TAKE ONE TAB BY MOUTH ONCE DAILY  30 tablet  7  . HUMULIN N 100 UNIT/ML injection INJECT 340 UNITS INTO THE SKIN AT BEDTIME.  100 mL  3  . HYDROcodone-acetaminophen (NORCO) 10-325 MG per tablet Take 1 tablet by mouth every 4 (four) hours as needed. For pain  30 tablet  5  . insulin lispro (HUMALOG) 100 UNIT/ML injection Three times daily (just before each meal) 170-110-230  160 mL  5  . losartan (COZAAR) 100 MG tablet Take 1 tablet (100 mg total) by mouth daily.  90 tablet  3  . lovastatin (MEVACOR) 40 MG tablet TAKE 2 TAB BY MOUTH AT BEDTIME  60 tablet  7  . tadalafil (CIALIS) 20 MG tablet as needed.          Allergies  Allergen Reactions  . Codeine     REACTION: chest pain  . Niacin-Lovastatin Er   . Pioglitazone     Family History  Problem Relation Age of Onset  . Cancer Mother     Breast Cancer  .  Cancer Sister     Breast Cancer    BP 124/66  Pulse 54  Temp(Src) 97.8 F (36.6 C) (Oral)  Ht 5\' 7"  (1.702 m)  Wt 284 lb (128.822 kg)  BMI 44.48 kg/m2  SpO2 95%   Review of Systems He has insomnia due to pain.  No earache or fever.  He has itching of the eac's.      Objective:   Physical Exam VITAL SIGNS:  See vs page GENERAL: no distress head: no deformity eyes: no periorbital swelling, no proptosis external nose and ears are normal mouth: no lesion seen Right eac is occluded with cerumen. left tm is red.   Lab Results  Component Value Date   HGBA1C 8.0* 06/06/2011   Lab Results  Component Value Date   WBC 7.9 06/06/2011   HGB 13.0 06/06/2011   HCT 40.6 06/06/2011   MCV 87.0 06/06/2011   PLT 186.0 06/06/2011      Assessment & Plan:  DM, needs increased rx Insomnia, needs increased rx Sinusitis,  new Anemia, improved

## 2011-06-06 NOTE — Patient Instructions (Addendum)
i have sent a prescription to your pharmacy, for an antibiotic blood tests are being requested for you today.  You will receive a letter with results. Please come back for a follow-up appointment in 3 months Loratadine-d (non-prescription) will help your congestion.  You should treat the ear canal itching with non-prescription hydrocortisone cream.   here are some tests for blood in the bowels.  please follow the instructions, and return to the lab downstairs Increase alprazolam to 0.5 mg at bedtime as needed for sleep.  Here is a prescription.

## 2011-06-07 ENCOUNTER — Telehealth: Payer: Self-pay | Admitting: Pulmonary Disease

## 2011-06-07 ENCOUNTER — Other Ambulatory Visit: Payer: Self-pay | Admitting: Pulmonary Disease

## 2011-06-07 ENCOUNTER — Telehealth: Payer: Self-pay | Admitting: *Deleted

## 2011-06-07 DIAGNOSIS — G4730 Sleep apnea, unspecified: Secondary | ICD-10-CM

## 2011-06-07 NOTE — Telephone Encounter (Signed)
I do have the cmn request for cpap supplies, and request for new rx.  Dr Elsworth Soho has agreed to new rx, and pt will need ov, last seen March 2012. Spoke with Mr Reade, and informed due for ov, and he will schedule.  Janie from [facility] informed of process and informed, as soon as rx is done, I will fax. Verdie Mosher

## 2011-06-07 NOTE — Telephone Encounter (Signed)
Donald [name] have you seen this form? Please advise and if not I will call and have them re fax.  Thanks!

## 2011-06-07 NOTE — Telephone Encounter (Signed)
This is a cmn given to alida. Please advise alida, thanks

## 2011-06-07 NOTE — Telephone Encounter (Signed)
Called pt to inform of lab results, left message for pt to callback office (letter also mailed to pt). 

## 2011-06-11 NOTE — Telephone Encounter (Signed)
Pt advised of lab results and MD's recommendations.

## 2011-06-11 NOTE — Telephone Encounter (Signed)
Called pt to inform of results, no answer/number busy.

## 2011-06-17 ENCOUNTER — Other Ambulatory Visit: Payer: Self-pay | Admitting: Endocrinology

## 2011-06-19 ENCOUNTER — Emergency Department (HOSPITAL_COMMUNITY)
Admission: EM | Admit: 2011-06-19 | Discharge: 2011-06-20 | Disposition: A | Discharge status: Home / Self Care | Payer: Medicare Other | Attending: Emergency Medicine | Admitting: Emergency Medicine

## 2011-06-19 ENCOUNTER — Telehealth: Payer: Self-pay | Admitting: Endocrinology

## 2011-06-19 ENCOUNTER — Encounter (HOSPITAL_COMMUNITY): Payer: Self-pay

## 2011-06-19 DIAGNOSIS — G4733 Obstructive sleep apnea (adult) (pediatric): Secondary | ICD-10-CM | POA: Diagnosis not present

## 2011-06-19 DIAGNOSIS — L299 Pruritus, unspecified: Secondary | ICD-10-CM | POA: Insufficient documentation

## 2011-06-19 DIAGNOSIS — R42 Dizziness and giddiness: Secondary | ICD-10-CM | POA: Diagnosis not present

## 2011-06-19 DIAGNOSIS — R079 Chest pain, unspecified: Secondary | ICD-10-CM | POA: Diagnosis not present

## 2011-06-19 DIAGNOSIS — E119 Type 2 diabetes mellitus without complications: Secondary | ICD-10-CM | POA: Insufficient documentation

## 2011-06-19 DIAGNOSIS — R739 Hyperglycemia, unspecified: Secondary | ICD-10-CM

## 2011-06-19 DIAGNOSIS — J45909 Unspecified asthma, uncomplicated: Secondary | ICD-10-CM | POA: Diagnosis not present

## 2011-06-19 DIAGNOSIS — Z79899 Other long term (current) drug therapy: Secondary | ICD-10-CM | POA: Insufficient documentation

## 2011-06-19 DIAGNOSIS — R110 Nausea: Secondary | ICD-10-CM | POA: Diagnosis not present

## 2011-06-19 DIAGNOSIS — T7840XA Allergy, unspecified, initial encounter: Secondary | ICD-10-CM

## 2011-06-19 DIAGNOSIS — E785 Hyperlipidemia, unspecified: Secondary | ICD-10-CM | POA: Diagnosis not present

## 2011-06-19 LAB — DIFFERENTIAL
Basophils Absolute: 0 10*3/uL (ref 0.0–0.1)
Eosinophils Absolute: 0 10*3/uL (ref 0.0–0.7)
Eosinophils Relative: 0 % (ref 0–5)
Lymphocytes Relative: 5 % — ABNORMAL LOW (ref 12–46)
Lymphs Abs: 0.7 10*3/uL (ref 0.7–4.0)
Neutrophils Relative %: 93 % — ABNORMAL HIGH (ref 43–77)

## 2011-06-19 LAB — CBC
HCT: 43.4 % (ref 39.0–52.0)
Hemoglobin: 14.6 g/dL (ref 13.0–17.0)
MCV: 83.8 fL (ref 78.0–100.0)
RDW: 14.2 % (ref 11.5–15.5)
WBC: 15.5 10*3/uL — ABNORMAL HIGH (ref 4.0–10.5)

## 2011-06-19 LAB — COMPREHENSIVE METABOLIC PANEL WITH GFR
ALT: 21 U/L (ref 0–53)
AST: 24 U/L (ref 0–37)
Albumin: 3.6 g/dL (ref 3.5–5.2)
Alkaline Phosphatase: 71 U/L (ref 39–117)
BUN: 35 mg/dL — ABNORMAL HIGH (ref 6–23)
CO2: 20 meq/L (ref 19–32)
Calcium: 9.1 mg/dL (ref 8.4–10.5)
Chloride: 102 meq/L (ref 96–112)
Creatinine, Ser: 1.76 mg/dL — ABNORMAL HIGH (ref 0.50–1.35)
GFR calc Af Amer: 46 mL/min — ABNORMAL LOW (ref >90)
GFR calc non Af Amer: 39 mL/min — ABNORMAL LOW (ref >90)
Glucose, Bld: 268 mg/dL — ABNORMAL HIGH (ref 70–99)
Potassium: 5 meq/L (ref 3.5–5.1)
Sodium: 137 meq/L (ref 135–145)
Total Bilirubin: 1.5 mg/dL — ABNORMAL HIGH (ref 0.3–1.2)
Total Protein: 6.4 g/dL (ref 6.0–8.3)

## 2011-06-19 LAB — GLUCOSE, CAPILLARY: Glucose-Capillary: 283 mg/dL — ABNORMAL HIGH (ref 70–99)

## 2011-06-19 LAB — POCT I-STAT TROPONIN I: Troponin i, poc: 0.06 ng/mL (ref 0.00–0.08)

## 2011-06-19 MED ORDER — METHYLPREDNISOLONE SODIUM SUCC 125 MG IJ SOLR: 125.0000 mg | Freq: Once | INTRAMUSCULAR | Status: DC

## 2011-06-19 MED ORDER — PREDNISONE 20 MG PO TABS
60.0000 mg | ORAL_TABLET | Freq: Once | ORAL | Status: AC
  Administered 2011-06-19: 60 mg via ORAL
  Filled 2011-06-19: qty 3

## 2011-06-19 MED ORDER — FAMOTIDINE 20 MG PO TABS
20.0000 mg | ORAL_TABLET | Freq: Once | ORAL | Status: AC
  Administered 2011-06-19: 20 mg via ORAL
  Filled 2011-06-19: qty 1

## 2011-06-19 MED ORDER — ONDANSETRON HCL 4 MG/2ML IJ SOLN: 4.0000 mg | Freq: Once | INTRAMUSCULAR | Status: DC

## 2011-06-19 MED ORDER — FAMOTIDINE IN NACL 20-0.9 MG/50ML-% IV SOLN: 20.0000 mg | Freq: Once | INTRAVENOUS | Status: DC

## 2011-06-19 MED ORDER — ONDANSETRON 4 MG PO TBDP
8.0000 mg | ORAL_TABLET | Freq: Once | ORAL | Status: AC
  Administered 2011-06-19: 8 mg via ORAL
  Filled 2011-06-19: qty 2

## 2011-06-19 MED ORDER — SODIUM CHLORIDE 0.9 % IV BOLUS (SEPSIS)
500.0000 mL | Freq: Once | INTRAVENOUS | Status: AC
  Administered 2011-06-19: 500 mL via INTRAVENOUS

## 2011-06-19 NOTE — ED Notes (Signed)
Pt states today at 1430 pt began having nausea with "small amount of cp", pt states he did not vomit.  Pt states he also had and episode of itching which he took benadryl. Pt states itching and cp  Is resolved but continues to have fatigue, nausea and chills. Pt states small amount of sob that has also resolved. " I feel like I will faint if I get up." pt placed on monitors.

## 2011-06-19 NOTE — ED Notes (Signed)
Titian pa aware of bp. Pt asymptomatic, iv team paged for access.

## 2011-06-19 NOTE — Telephone Encounter (Signed)
Caller: Priscilla/Spouse; PCP: Renato Shin; CB#: [phone]; ; ; Call regarding Itching All Over, Nausea, and Some Swelling in Hands; Onset 1445.  Stated," I dont feel right."  He was hot, fanning, gagging, dizzy, nausea.  Had taken a bite of bacon/egg biscuit from  [place]  just before onset of sx est. 1430.  Benadryl given with short term relief.  ED per Allergic Reaction protocol.  Caller states plan to go to [facility].

## 2011-06-19 NOTE — ED Notes (Signed)
Pt with generalized redness noted. respirations easy non labored, no airway compromise noted. IV attempt x2 unable to obtain. Medication orders changed to PO

## 2011-06-19 NOTE — ED Provider Notes (Signed)
History     CSN: [id number]  Arrival date & time 06/19/11  1846   First MD Initiated Contact with Patient 06/19/11 1930      Chief Complaint  Patient presents with  . Chest Pain    (Consider location/radiation/quality/duration/timing/severity/associated sxs/prior treatment) HPI Comments: Pt is a 66yo male who presents with complaint of dizziness, nausea, weakness, chills, light headiness, itching, swelling of hands, lower lip. Pt states he was sitting in a chair when symptoms started. He just got back from a drive through where he picked up a biscuit, states only took a bite. States also just taken his insulin shot. Per wife. Pt looked "red" and had swelling to hands and lower lip. Pt states he was itching all over and felt nauseated. States took two benadryl, which improved his itching and swelling, however still feels nauseated, light headed.      Past Medical History  Diagnosis Date  . DIABETES MELLITUS, TYPE II 07/26/2006  . DYSLIPIDEMIA 04/13/2009  . ANEMIA-IRON DEFICIENCY 07/26/2006  . Pernicious anemia 11/20/2006  . DEPRESSION 03/14/2009  . PERIPHERAL NEUROPATHY 07/26/2006  . HYPERTENSION 07/26/2006  . PULMONARY EMBOLISM 10/05/2007  . D V T 12/03/2007  . ASTHMA 07/26/2006  . GERD 07/26/2006  . [place] DISEASE, LUMBAR 10/05/2007  . INSOMNIA 08/21/2007  . OBSTRUCTIVE SLEEP APNEA 12/03/2007  . COLONIC POLYPS, HX OF 07/26/2006    Past Surgical History  Procedure Date  . Appendectomy 1968  . Back surgery 1977, 06/14/2008    Dr. Trenton Gammon at [facility] (06/10)    Family History  Problem Relation Age of Onset  . Cancer Mother     Breast Cancer  . Cancer Sister     Breast Cancer    History  Substance Use Topics  . Smoking status: Never Smoker   . Smokeless tobacco: Not on file  . Alcohol Use: No      Review of Systems  Constitutional: Positive for chills. Negative for fever.  HENT: Positive for facial swelling. Negative for sore throat, trouble swallowing and neck pain.    Respiratory: Negative for chest tightness and shortness of breath.   Cardiovascular: Negative for chest pain, palpitations and leg swelling.  Gastrointestinal: Positive for nausea. Negative for vomiting and abdominal pain.  Musculoskeletal: Positive for back pain.  Skin: Negative.   Neurological: Positive for dizziness, weakness and light-headedness.  Psychiatric/Behavioral: Negative.     Allergies  Codeine; Niacin-lovastatin er; and Pioglitazone  Home Medications   Current Outpatient Rx  Name Route Sig Dispense Refill  . ALPRAZOLAM 0.5 MG PO TABS Oral Take 1 tablet (0.5 mg total) by mouth at bedtime as needed for sleep. 30 tablet 3  . ASPIRIN 81 MG PO CHEW Oral Chew 81 mg by mouth daily.    Marland Kitchen CARVEDILOL 6.25 MG PO TABS  TAKE 1 TABLET TWICE A DAY WITH MEALS 60 tablet 5  . CYANOCOBALAMIN 1000 MCG/ML IJ SOLN  1 injection every 3 months     . DOCUSATE SODIUM 50 MG PO CAPS Oral Take 100 mg by mouth at bedtime.    . FUROSEMIDE 80 MG PO TABS  TAKE ONE TAB BY MOUTH ONCE DAILY 30 tablet 7  . HUMULIN N 100 UNIT/ML [place] SUSP  INJECT 340 UNITS AT BEDTIME AS DIRECTED 100 mL 4    PT IS WAITING!!!  . HYDROCODONE-ACETAMINOPHEN 10-325 MG PO TABS Oral Take 1 tablet by mouth every 4 (four) hours as needed. For pain 30 tablet 5  . INSULIN LISPRO (HUMAN) 100 UNIT/ML [place]  SOLN  Three times daily (just before each meal) 170-110-230 160 mL 5  . LOSARTAN POTASSIUM 100 MG PO TABS Oral Take 1 tablet (100 mg total) by mouth daily. 90 tablet 3  . LOVASTATIN 40 MG PO TABS  TAKE 2 TAB BY MOUTH AT BEDTIME 60 tablet 7  . FAMOTIDINE 10 MG PO CHEW Oral Chew 10 mg by mouth at bedtime.        BP 127/31  Pulse 59  Temp(Src) 97.8 F (36.6 C) (Oral)  Resp 16  SpO2 95%  Physical Exam  Nursing note and vitals reviewed. Constitutional: He is oriented to person, place, and time. He appears well-developed and well-nourished. No distress.       obese  HENT:  Head: Normocephalic and atraumatic.  Right Ear: External ear  normal.  Left Ear: External ear normal.  Nose: Nose normal.  Mouth/Throat: Oropharynx is clear and moist.       tongue dry, oral mucosa dry. No swelling of lips, tongue, uvula  Eyes: Conjunctivae and EOM are normal. Pupils are equal, round, and reactive to light.  Neck: Neck supple.  Cardiovascular: Normal rate, regular rhythm and normal heart sounds.   Pulmonary/Chest: Effort normal and breath sounds normal. No respiratory distress. He has no wheezes. He has no rales.  Abdominal: Soft. Bowel sounds are normal. He exhibits no distension. There is no tenderness. There is no rebound.  Musculoskeletal: Normal range of motion.  Neurological: He is alert and oriented to person, place, and time. He displays normal reflexes. No cranial nerve deficit. He exhibits normal muscle tone. Coordination normal.  Skin: Skin is warm and dry. He is not diaphoretic.       Pink skin tone to the neck, chest, upper back. Multiple excoriations all over the body  Psychiatric: He has a normal mood and affect.    ED Course  Procedures (including critical care time)  I suspect allergic reaction/anophylaxis. Pt received benadryl prior to coming, with most symptoms now resolved. Ordered steroids in ED, pepcid. Will monitor.   Results for orders placed during the hospital encounter of 06/19/11  CBC      Component Value Range   WBC 15.5 (*) 4.0 - 10.5 (K/uL)   RBC 5.18  4.22 - 5.81 (MIL/uL)   Hemoglobin 14.6  13.0 - 17.0 (g/dL)   HCT 43.4  39.0 - 52.0 (%)   MCV 83.8  78.0 - 100.0 (fL)   MCH 28.2  26.0 - 34.0 (pg)   MCHC 33.6  30.0 - 36.0 (g/dL)   RDW 14.2  11.5 - 15.5 (%)   Platelets 211  150 - 400 (K/uL)  DIFFERENTIAL      Component Value Range   Neutrophils Relative 93 (*) 43 - 77 (%)   Neutro Abs 14.4 (*) 1.7 - 7.7 (K/uL)   Lymphocytes Relative 5 (*) 12 - 46 (%)   Lymphs Abs 0.7  0.7 - 4.0 (K/uL)   Monocytes Relative 2 (*) 3 - 12 (%)   Monocytes Absolute 0.3  0.1 - 1.0 (K/uL)   Eosinophils Relative 0  0  - 5 (%)   Eosinophils Absolute 0.0  0.0 - 0.7 (K/uL)   Basophils Relative 0  0 - 1 (%)   Basophils Absolute 0.0  0.0 - 0.1 (K/uL)  COMPREHENSIVE METABOLIC PANEL      Component Value Range   Sodium 137  135 - 145 (mEq/L)   Potassium 5.0  3.5 - 5.1 (mEq/L)   Chloride 102  96 - 112 (  mEq/L)   CO2 20  19 - 32 (mEq/L)   Glucose, Bld 268 (*) 70 - 99 (mg/dL)   BUN 35 (*) 6 - 23 (mg/dL)   Creatinine, Ser 1.76 (*) 0.50 - 1.35 (mg/dL)   Calcium 9.1  8.4 - 10.5 (mg/dL)   Total Protein 6.4  6.0 - 8.3 (g/dL)   Albumin 3.6  3.5 - 5.2 (g/dL)   AST 24  0 - 37 (U/L)   ALT 21  0 - 53 (U/L)   Alkaline Phosphatase 71  39 - 117 (U/L)   Total Bilirubin 1.5 (*) 0.3 - 1.2 (mg/dL)   GFR calc non Af Amer 39 (*) >90 (mL/min)   GFR calc Af Amer 46 (*) >90 (mL/min)  GLUCOSE, CAPILLARY      Component Value Range   Glucose-Capillary 283 (*) 70 - 99 (mg/dL)  POCT I-STAT TROPONIN I      Component Value Range   Troponin i, poc 0.06  0.00 - 0.08 (ng/mL)   Comment 3            9:39 PM Elevated WBC. Pt feeling better. BP marginal, 90s/60s. Pt states he usually runs on lower side. Will order fluids. Problems getting IV access, IV team paged.   12:46 AM Pt received fluids. Pt's skin rash completely resolved. He is feeling at baseline. No longer nauseated, no dizziness, no itching, no swelling of lips. I suspect pt has had anaphylaxis. He has been monitored in ED for 6 hrs with no symptoms. Will d/c home with close follow up with PCP.   Filed Vitals:   06/20/11 0015  BP: 115/57  Pulse: 65  Temp:   Resp: 31    Date: 06/20/2011  Rate: 59  Rhythm: sinus bradycardia  QRS Axis: normal  Intervals: normal  ST/T Wave abnormalities: nonspecific T wave changes  Conduction Disutrbances:none  Narrative Interpretation:   Old EKG Reviewed: changes noted T wave inversion in lead 3 only compared to ECG from 11/13/10     1. Allergic reaction   2. Hyperglycemia       MDM          Renold Genta,  PA 06/20/11 [place], [place] 06/20/11 0130

## 2011-06-19 NOTE — ED Notes (Signed)
Was eating a bisquit and developed chest tightness, chills fever , nausea, headache, itching and weakness.

## 2011-06-19 NOTE — ED Notes (Signed)
Iv team at bedside  

## 2011-06-20 MED ORDER — FAMOTIDINE 20 MG PO TABS: 20.0000 mg | ORAL_TABLET | Freq: Two times a day (BID) | ORAL | Status: DC

## 2011-06-20 MED ORDER — EPINEPHRINE 0.3 MG/0.3ML IJ DEVI: 0.3000 mg | INTRAMUSCULAR | Status: DC | PRN

## 2011-06-20 MED ORDER — PREDNISONE 20 MG PO TABS: 40.0000 mg | ORAL_TABLET | Freq: Every day | ORAL | Status: DC

## 2011-06-20 MED ORDER — DIPHENHYDRAMINE HCL 50 MG/ML IJ SOLN
25.0000 mg | Freq: Once | INTRAMUSCULAR | Status: AC
  Administered 2011-06-20: 25 mg via INTRAVENOUS
  Filled 2011-06-20: qty 1

## 2011-06-20 NOTE — ED Notes (Signed)
Pt given discharge instructions via teach back.

## 2011-06-20 NOTE — ED Provider Notes (Signed)
Medical screening examination/treatment/procedure(s) were performed by non-physician practitioner and as supervising physician I was immediately available for consultation/collaboration.   Blanchie Dessert, MD 06/20/11 2333

## 2011-06-20 NOTE — Discharge Instructions (Signed)
Your lab work showed elevated glucose and white blood count leveles. I believe your symptoms were due to an allergic reaction. Continue benadryl, 25mg  every 4 hrs for the next 3 days. Take prednisone as prescribed. This medication will make your blood sugar higher, make sure you keep a close eye on it and take insulin. Take pepcid as well as prescribed. If symptoms worsen return to ED. If swelling of lips, tongue, throat, use epipen.  Follow up closely with primary care doctor for recheck, call tomorrow.   Anaphylactic Reaction An anaphylactic reaction is a severe allergic reaction. It may be caused by medicines, food, insect bites, or other common items. It cannot spread from one person to another (contagious). It can be life threatening and require hospitalization.  SYMPTOMS  Symptoms may include, but are not limited to, the following:  Skin rash or hives.   Itching.   Chest tightness.   Swelling (including the eyes, tongue, or lips).   Trouble breathing or swallowing.   Lightheadedness or fainting.   Stomach pains or vomiting.  Symptoms may gradually disappear when you are no longer around the substance that caused the problem. You may find that 2 or 3 days are needed for symptoms to go away completely. HOME CARE INSTRUCTIONS   Carry a card or wear a bracelet that lists anything that has caused past anaphylaxis or a less severe allergic reaction.   If 1 or more medicines have caused past problems, you need to avoid the same or similar medicines in the future.   Talk with a medical caregiver before using any new prescription or over-the-counter medicines.   If you develop hives or a rash:   Apply cold compresses to the skin, or take a cool bath to help reduce itching.   Avoid hot baths or showers. This might make itching or the rash worse.   Wear loose-fitting clothes.   If you have had a severe allergic reaction in the past:   Carry an anaphylaxis kit with you at all times.  Both you and family members should be shown how to give the medicines in the kit. This can be lifesaving if there is another severe reaction. If it needs to be used, and symptoms improve, it is still important for you to seek immediate medical care or call your local emergency services (911 in the [place] This is because severe symptoms can return when the medicine in the kit wears off.   If hospitalization is not required, you should have fast access to emergency services if the problem recurs. If a family member or friend has been shown how to give the medicines in an anaphylaxis kit and can stay with you, he or she can also help give the medicines from the kit if problems return.   Make sure you renew your anaphylaxis kit when it gets close to being out of date.   You may return to your normal activities when the allergic symptoms are gone.  SEEK MEDICAL CARE IF:   You develop symptoms of an allergic reaction to a new substance. Symptoms may occur right away or minutes later.   Rash, hives, or itching occurs.   You have different symptoms than you have had previously.  SEEK IMMEDIATE MEDICAL CARE IF:   You have difficulty breathing, start to wheeze, or a tight feeling in the chest or throat develops.   You develop swelling of the mouth or tongue or swelling or itching over most of your body.  You develop severe stomach pains or repeated vomiting.   You feel very lightheaded or pass out.   Chest pain develops or there is a worsening of problems noted above. THIS IS AN EMERGENCY. Call your local emergency services (911 in the [place]  MAKE SURE YOU:   Understand these instructions.   Will watch your condition.   Will get help right away if you have recurrent problems or have problems that are getting worse.  Document Released: 12/31/2004 Document Revised: 12/20/2010 Document Reviewed: 08/19/2007 [facility] Patient Information 2012 [place].

## 2011-06-24 ENCOUNTER — Encounter: Payer: Self-pay | Admitting: Endocrinology

## 2011-06-24 ENCOUNTER — Ambulatory Visit (INDEPENDENT_AMBULATORY_CARE_PROVIDER_SITE_OTHER): Payer: Medicare Other | Admitting: Endocrinology

## 2011-06-24 VITALS — BP 124/50 | HR 57 | Temp 98.3°F | Wt 288.0 lb

## 2011-06-24 DIAGNOSIS — E119 Type 2 diabetes mellitus without complications: Secondary | ICD-10-CM | POA: Diagnosis not present

## 2011-06-24 DIAGNOSIS — R42 Dizziness and giddiness: Secondary | ICD-10-CM | POA: Diagnosis not present

## 2011-06-24 DIAGNOSIS — I10 Essential (primary) hypertension: Secondary | ICD-10-CM

## 2011-06-24 DIAGNOSIS — Z23 Encounter for immunization: Secondary | ICD-10-CM | POA: Diagnosis not present

## 2011-06-24 NOTE — Progress Notes (Signed)
Subjective:    Patient ID: Donald [name], male    DOB: Jun 29, 1945, 66 y.o.   MRN: [id number]  HPI Pt was seen in ER last week for a few days of non-vertigenous dizziness sensation in the head, and assoc itching.  He was rx'ed prednisone in the ER, and feels better now.  He had cbg's as high as the 300's, but it is better now.   Past Medical History  Diagnosis Date  . DIABETES MELLITUS, TYPE II 07/26/2006  . DYSLIPIDEMIA 04/13/2009  . ANEMIA-IRON DEFICIENCY 07/26/2006  . Pernicious anemia 11/20/2006  . DEPRESSION 03/14/2009  . PERIPHERAL NEUROPATHY 07/26/2006  . HYPERTENSION 07/26/2006  . PULMONARY EMBOLISM 10/05/2007  . D V T 12/03/2007  . ASTHMA 07/26/2006  . GERD 07/26/2006  . [place] DISEASE, LUMBAR 10/05/2007  . INSOMNIA 08/21/2007  . OBSTRUCTIVE SLEEP APNEA 12/03/2007  . COLONIC POLYPS, HX OF 07/26/2006    Past Surgical History  Procedure Date  . Appendectomy 1968  . Back surgery 1977, 06/14/2008    Dr. Trenton Gammon at [facility] (06/10)    History   Social History  . Marital Status: Married    Spouse Name: N/A    Number of Children: N/A  . Years of Education: N/A   Occupational History  . Disabled    Social History Main Topics  . Smoking status: Never Smoker   . Smokeless tobacco: Not on file  . Alcohol Use: No  . Drug Use: No  . Sexually Active:    Other Topics Concern  . 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 ALPRAZolam (XANAX) 0.5 MG tablet Take 1 tablet (0.5 mg total) by mouth at bedtime as needed for sleep.  30 tablet  3  . aspirin 81 MG chewable tablet Chew 81 mg by mouth daily.      . cyanocobalamin (,VITAMIN B-12,) 1000 MCG/ML injection 1 injection every 3 months       . EPINEPHrine (EPIPEN) 0.3 mg/0.3 mL  DEVI Inject 0.3 mLs (0.3 mg total) into the muscle as needed.  2 Device  1  . famotidine (PEPCID) 20 MG tablet Take 1 tablet (20 mg total) by mouth 2 (two) times daily.  30 tablet  0  . furosemide (LASIX) 80 MG tablet TAKE ONE TAB BY MOUTH ONCE DAILY  30 tablet  7  . HUMULIN N 100 UNIT/ML injection INJECT 340 UNITS AT BEDTIME AS DIRECTED  100 mL  4  . HYDROcodone-acetaminophen (NORCO) 10-325 MG per tablet Take 1 tablet by mouth every 4 (four) hours as needed. For pain  30 tablet  5  . insulin lispro (HUMALOG) 100 UNIT/ML injection Three times daily (just before each meal) 170-110-230  160 mL  5  . losartan (COZAAR) 100 MG tablet Take 1 tablet (100 mg total) by mouth daily.  90 tablet  3  . lovastatin (MEVACOR) 40 MG tablet TAKE 2 TAB BY MOUTH AT BEDTIME  60 tablet  7    Allergies  Allergen Reactions  . Codeine     REACTION: chest pain  . Niacin-Lovastatin Er   . Pioglitazone     Family History  Problem Relation Age of Onset  . Cancer Mother     Breast Cancer  . Cancer Sister     Breast Cancer  BP 124/50  Pulse 57  Temp 98.3 F (36.8 C) (Oral)  Wt 288 lb (130.636 kg)  SpO2 95%   Review of Systems Denies LOC and fever    Objective:   Physical Exam VITAL SIGNS:  See vs page GENERAL: no distress head: no deformity eyes: no periorbital swelling, no proptosis.  No swelling.   external nose and ears are normal mouth: no lesion seen.  No swelling.   Skin: no rash now.     Lab Results  Component Value Date   HGBA1C 8.0* 06/06/2011      Assessment & Plan:  DM.  improved HTN, overcontrolled Dizziness, prob due to overcontrol of HTN

## 2011-06-24 NOTE — Patient Instructions (Addendum)
Stop carvedilol for now.  Call if your blood pressure goes over 150 (top number).   Please come back in august as as scheduled.   Please continue the same insulin. I hope you continue to feel better.  If you don't feel better by next week, please call back.  Please call sooner if you get worse.

## 2011-08-06 ENCOUNTER — Ambulatory Visit (INDEPENDENT_AMBULATORY_CARE_PROVIDER_SITE_OTHER): Payer: Medicare Other | Admitting: Pulmonary Disease

## 2011-08-06 ENCOUNTER — Encounter: Payer: Self-pay | Admitting: Pulmonary Disease

## 2011-08-06 VITALS — BP 122/54 | HR 58 | Temp 97.5°F | Ht 67.0 in | Wt 281.2 lb

## 2011-08-06 DIAGNOSIS — I2699 Other pulmonary embolism without acute cor pulmonale: Secondary | ICD-10-CM | POA: Diagnosis not present

## 2011-08-06 DIAGNOSIS — M48061 Spinal stenosis, lumbar region without neurogenic claudication: Secondary | ICD-10-CM | POA: Diagnosis not present

## 2011-08-06 DIAGNOSIS — G4730 Sleep apnea, unspecified: Secondary | ICD-10-CM

## 2011-08-06 NOTE — Assessment & Plan Note (Signed)
Severe, on cpap 15 cm, compliant Weight loss encouraged, compliance with goal of at least 4-6 hrs every night is the expectation. Advised against medications with sedative side effects Cautioned against driving when sleepy - understanding that sleepiness will vary on a day to day basis Discussed addictive potential of xanax

## 2011-08-06 NOTE — Patient Instructions (Addendum)
Stay on CPAP 15 cm Supplies will be renewed

## 2011-08-06 NOTE — Assessment & Plan Note (Signed)
resolved 

## 2011-08-06 NOTE — Progress Notes (Signed)
  Subjective:    Patient ID: Donald [name], male    DOB: [birth year], 66 y.o.   MRN: [id number]  HPI  66yowm never smoker for FU of obstructive sleep apnea & asthmatic bronchitis 1-2 episodes /year.  He developed DVT/pulmonary embolism 9/09 -seemingly idiopathic,subseq underwent back surgery 6/10 by Dr Trenton Gammon- Off coumadin since 10/10  He also had a chronic cough, which was attributed to GERD.  05/18/08 >> 50 mg trazodone helping with sleep, 300 mg made him 'wired'    6/00: Polysomnogram at [facility], RDI 138/h, lowest desatn 77%,maintained on CPAP+ 15 with a nasal mask. ([facility], [place]) since.  9/09 >> duplex >>echogenic material is seen in the left profunda femoral, femoral and popliteal veins.  02/18/08 >>residual thrombus in left prox to distal SFV  4/10 residual thrombus in Lt SFV persists  9/09 >> spiral CT >>Extensive bilateral pulmonary embolism is seen in both right and left pulmonary arteries as well as their lobar branches.  12/07/07 CT angio shows incomplete recanalisation of pulmonary arteries   08/06/2011  Last seen March 26, 2010- - rpt cpap titration at [place], on 15 cm C/o back pain Off trazodone now on xanax 0.5 mg q hs - has hangover efect Compliant with CPAP, mask ok, pressure ok, no dryness  Review of Systems Patient denies significant dyspnea,cough, hemoptysis,  chest pain, palpitations, pedal edema, orthopnea, paroxysmal nocturnal dyspnea, lightheadedness, nausea, vomiting, abdominal or  leg pains      Objective:   Physical Exam  Gen. Pleasant, well-nourished, in no distress ENT - no lesions, no post nasal drip Neck: No JVD, no thyromegaly, no carotid bruits Lungs: no use of accessory muscles, no dullness to percussion, clear without rales or rhonchi  Cardiovascular: Rhythm regular, heart sounds  normal, no murmurs or gallops, no peripheral edema Musculoskeletal: No deformities, no cyanosis or clubbing        Assessment &  Plan:

## 2011-08-07 ENCOUNTER — Ambulatory Visit: Payer: Medicare Other | Admitting: Pulmonary Disease

## 2011-08-08 DIAGNOSIS — M47817 Spondylosis without myelopathy or radiculopathy, lumbosacral region: Secondary | ICD-10-CM | POA: Diagnosis not present

## 2011-08-16 DIAGNOSIS — M961 Postlaminectomy syndrome, not elsewhere classified: Secondary | ICD-10-CM | POA: Diagnosis not present

## 2011-08-16 DIAGNOSIS — M5137 Other intervertebral disc degeneration, lumbosacral region: Secondary | ICD-10-CM | POA: Diagnosis not present

## 2011-09-05 ENCOUNTER — Ambulatory Visit: Payer: Medicare Other | Admitting: Endocrinology

## 2011-09-09 DIAGNOSIS — M5137 Other intervertebral disc degeneration, lumbosacral region: Secondary | ICD-10-CM | POA: Diagnosis not present

## 2011-09-09 DIAGNOSIS — M961 Postlaminectomy syndrome, not elsewhere classified: Secondary | ICD-10-CM | POA: Diagnosis not present

## 2011-09-09 DIAGNOSIS — G894 Chronic pain syndrome: Secondary | ICD-10-CM | POA: Diagnosis not present

## 2011-09-18 ENCOUNTER — Ambulatory Visit (INDEPENDENT_AMBULATORY_CARE_PROVIDER_SITE_OTHER): Payer: Medicare Other | Admitting: Endocrinology

## 2011-09-18 ENCOUNTER — Other Ambulatory Visit (INDEPENDENT_AMBULATORY_CARE_PROVIDER_SITE_OTHER): Payer: Medicare Other

## 2011-09-18 ENCOUNTER — Encounter: Payer: Self-pay | Admitting: Endocrinology

## 2011-09-18 VITALS — BP 122/56 | HR 53 | Temp 96.5°F | Ht 67.0 in | Wt 287.0 lb

## 2011-09-18 DIAGNOSIS — E1149 Type 2 diabetes mellitus with other diabetic neurological complication: Secondary | ICD-10-CM

## 2011-09-18 DIAGNOSIS — D509 Iron deficiency anemia, unspecified: Secondary | ICD-10-CM

## 2011-09-18 DIAGNOSIS — E119 Type 2 diabetes mellitus without complications: Secondary | ICD-10-CM

## 2011-09-18 DIAGNOSIS — E1049 Type 1 diabetes mellitus with other diabetic neurological complication: Secondary | ICD-10-CM | POA: Diagnosis not present

## 2011-09-18 DIAGNOSIS — E1142 Type 2 diabetes mellitus with diabetic polyneuropathy: Secondary | ICD-10-CM | POA: Diagnosis not present

## 2011-09-18 DIAGNOSIS — E1065 Type 1 diabetes mellitus with hyperglycemia: Secondary | ICD-10-CM

## 2011-09-18 LAB — HEMOGLOBIN A1C: Hgb A1c MFr Bld: 7.9 % — ABNORMAL HIGH (ref 4.6–6.5)

## 2011-09-18 MED ORDER — CYANOCOBALAMIN 1000 MCG/ML IJ SOLN
1000.0000 ug | Freq: Once | INTRAMUSCULAR | Status: AC
  Administered 2011-09-18: 1000 ug via INTRAMUSCULAR

## 2011-09-18 NOTE — Patient Instructions (Addendum)
blood tests are being requested for you today.  You will receive a letter with results.  Please come back for a follow-up appointment in 3 months. 

## 2011-09-18 NOTE — Progress Notes (Signed)
Subjective:    Patient ID: Donald [name], male    DOB: July 30, 1945, 66 y.o.   MRN: [id number]  HPI Pt returns for f/u of IDDM (dx'ed 1989; characterized by severe insulin resistance; complicated by peripheral sensory neuropathy, CAD, and nephropathy).  no cbg record, but states cbg's are well-controlled.  denies hypoglycemia.  There is no trend throughout the day, but it is still highest in the afternoon.   Past Medical History  Diagnosis Date  . DIABETES MELLITUS, TYPE II 07/26/2006  . DYSLIPIDEMIA 04/13/2009  . ANEMIA-IRON DEFICIENCY 07/26/2006  . Pernicious anemia 11/20/2006  . DEPRESSION 03/14/2009  . PERIPHERAL NEUROPATHY 07/26/2006  . HYPERTENSION 07/26/2006  . PULMONARY EMBOLISM 10/05/2007  . D V T 12/03/2007  . ASTHMA 07/26/2006  . GERD 07/26/2006  . [place] DISEASE, LUMBAR 10/05/2007  . INSOMNIA 08/21/2007  . OBSTRUCTIVE SLEEP APNEA 12/03/2007  . COLONIC POLYPS, HX OF 07/26/2006    Past Surgical History  Procedure Date  . Appendectomy 1968  . Back surgery 1977, 06/14/2008    Dr. Trenton Gammon at [facility] (06/10)    History   Social History  . Marital Status: Married    Spouse Name: N/A    Number of Children: N/A  . Years of Education: N/A   Occupational History  . Disabled    Social History Main Topics  . Smoking status: Never Smoker   . Smokeless tobacco: Never Used  . Alcohol Use: No  . Drug Use: No  . Sexually Active: Not on file   Other Topics Concern  . 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 aspirin 81 MG chewable tablet Chew 81 mg by mouth daily.      . cyanocobalamin (,VITAMIN B-12,) 1000 MCG/ML injection 1 injection every 3 months       . EPINEPHrine (EPIPEN) 0.3 mg/0.3 mL DEVI Inject 0.3 mLs (0.3 mg  total) into the muscle as needed.  2 Device  1  . famotidine (PEPCID) 20 MG tablet Take 1 tablet (20 mg total) by mouth 2 (two) times daily.  30 tablet  0  . furosemide (LASIX) 80 MG tablet TAKE ONE TAB BY MOUTH ONCE DAILY  30 tablet  7  . HUMULIN N 100 UNIT/ML injection INJECT 340 UNITS AT BEDTIME AS DIRECTED  100 mL  4  . HYDROcodone-acetaminophen (NORCO) 10-325 MG per tablet Take 1 tablet by mouth every 4 (four) hours as needed. For pain  30 tablet  5  . losartan (COZAAR) 100 MG tablet Take 1 tablet (100 mg total) by mouth daily.  90 tablet  3  . lovastatin (MEVACOR) 40 MG tablet TAKE 2 TAB BY MOUTH AT BEDTIME  60 tablet  7  . DISCONTD: insulin lispro (HUMALOG) 100 UNIT/ML injection Three times daily (just before each meal) 170-110-230  160 mL  5   No current facility-administered medications on file prior to visit.    Allergies  Allergen Reactions  . Codeine     REACTION: chest pain  . Niacin-Lovastatin Er   . Pioglitazone     Family History  Problem Relation Age of Onset  . Cancer Mother     Breast Cancer  . Cancer Sister     Breast Cancer    BP 122/56  Pulse 53  Temp 96.5  F (35.8 C) (Oral)  Ht 5\' 7"  (1.702 m)  Wt 287 lb (130.182 kg)  BMI 44.95 kg/m2  SpO2 97%    Review of Systems denies hypoglycemia.      Objective:   Physical Exam Pulses: dorsalis pedis intact bilat.   Feet: no deformity.  no ulcer on the feet.  feet are of normal color and temp.  trace bilat leg edema Neuro: sensation is intact to touch on the feet.    Lab Results  Component Value Date   HGBA1C 7.9* 09/18/2011      Assessment & Plan:  DM: therapy limited by noncompliance.  i'll do the best i can.

## 2011-09-27 DIAGNOSIS — M722 Plantar fascial fibromatosis: Secondary | ICD-10-CM | POA: Diagnosis not present

## 2011-09-30 ENCOUNTER — Telehealth: Payer: Self-pay | Admitting: Endocrinology

## 2011-09-30 DIAGNOSIS — Z0279 Encounter for issue of other medical certificate: Secondary | ICD-10-CM

## 2011-09-30 NOTE — Telephone Encounter (Signed)
Left message for pt on VM  to let him know letter was completed and mailed to his address.

## 2011-09-30 NOTE — Telephone Encounter (Signed)
Pt stated that he has jury duty at the end of this month 10/14/11. Pt was wondering if Dr. Loanne Drilling can give him an excuse letter due to his back pain and pt stated he can not sit for a long time. If this is ok please mail letter once its done and please call pt.

## 2011-09-30 NOTE — Telephone Encounter (Signed)
i printed 

## 2011-10-10 DIAGNOSIS — Z23 Encounter for immunization: Secondary | ICD-10-CM | POA: Diagnosis not present

## 2011-10-11 DIAGNOSIS — M722 Plantar fascial fibromatosis: Secondary | ICD-10-CM | POA: Diagnosis not present

## 2011-11-04 DIAGNOSIS — H6120 Impacted cerumen, unspecified ear: Secondary | ICD-10-CM | POA: Diagnosis not present

## 2011-11-04 DIAGNOSIS — L989 Disorder of the skin and subcutaneous tissue, unspecified: Secondary | ICD-10-CM | POA: Diagnosis not present

## 2011-11-04 DIAGNOSIS — H906 Mixed conductive and sensorineural hearing loss, bilateral: Secondary | ICD-10-CM | POA: Diagnosis not present

## 2011-11-04 DIAGNOSIS — L820 Inflamed seborrheic keratosis: Secondary | ICD-10-CM | POA: Diagnosis not present

## 2011-11-04 DIAGNOSIS — J018 Other acute sinusitis: Secondary | ICD-10-CM | POA: Diagnosis not present

## 2011-11-10 ENCOUNTER — Other Ambulatory Visit: Payer: Self-pay | Admitting: Endocrinology

## 2011-11-12 ENCOUNTER — Telehealth: Payer: Self-pay | Admitting: *Deleted

## 2011-11-12 NOTE — Telephone Encounter (Signed)
Rx faxed to pharmacy # [phone]

## 2011-11-26 ENCOUNTER — Other Ambulatory Visit: Payer: Self-pay | Admitting: Endocrinology

## 2011-11-27 ENCOUNTER — Other Ambulatory Visit: Payer: Self-pay | Admitting: *Deleted

## 2011-11-27 NOTE — Telephone Encounter (Signed)
MEDICATION REFILL FOR HYDROCONDONE-ACETAMINOPHEN FAXED TO [facility]. PATIENT NOTIFIED.

## 2011-12-04 DIAGNOSIS — D485 Neoplasm of uncertain behavior of skin: Secondary | ICD-10-CM | POA: Diagnosis not present

## 2011-12-04 DIAGNOSIS — R209 Unspecified disturbances of skin sensation: Secondary | ICD-10-CM | POA: Diagnosis not present

## 2011-12-14 ENCOUNTER — Other Ambulatory Visit: Payer: Self-pay | Admitting: Endocrinology

## 2011-12-16 DIAGNOSIS — G5600 Carpal tunnel syndrome, unspecified upper limb: Secondary | ICD-10-CM | POA: Diagnosis not present

## 2011-12-17 ENCOUNTER — Other Ambulatory Visit: Payer: Self-pay | Admitting: Endocrinology

## 2011-12-23 ENCOUNTER — Ambulatory Visit: Payer: Medicare Other | Admitting: Endocrinology

## 2011-12-25 ENCOUNTER — Encounter: Payer: Self-pay | Admitting: Endocrinology

## 2011-12-25 ENCOUNTER — Ambulatory Visit (INDEPENDENT_AMBULATORY_CARE_PROVIDER_SITE_OTHER): Payer: Medicare Other | Admitting: Endocrinology

## 2011-12-25 VITALS — BP 128/74 | HR 74 | Temp 98.0°F | Wt 288.0 lb

## 2011-12-25 DIAGNOSIS — E119 Type 2 diabetes mellitus without complications: Secondary | ICD-10-CM

## 2011-12-25 DIAGNOSIS — Z119 Encounter for screening for infectious and parasitic diseases, unspecified: Secondary | ICD-10-CM | POA: Diagnosis not present

## 2011-12-25 LAB — HEMOGLOBIN A1C
Hgb A1c MFr Bld: 7.7 % — ABNORMAL HIGH (ref <5.7)
Mean Plasma Glucose: 174 mg/dL — ABNORMAL HIGH (ref <117)

## 2011-12-25 NOTE — Patient Instructions (Addendum)
good diet and exercise habits significanly improve the control of your diabetes.  please let me know if you wish to be referred to a dietician.  high blood sugar is very risky to your health.  you should see an eye doctor every year.  You are at higher than average risk for pneumonia and hepatitis-B.  You should be vaccinated against both.   check your blood sugar twice a day.  vary the time of day when you check, between before the 3 meals, and at bedtime.  also check if you have symptoms of your blood sugar being too high or too low.  please keep a record of the readings and bring it to your next appointment here.  please call us sooner if your blood sugar goes below 70, or if you have a lot of readings over 200.   blood tests are being requested for you today.  We'll contact you with results.   pending the test results, please continue the same insulin for now.

## 2011-12-25 NOTE — Progress Notes (Signed)
Subjective:    Patient ID: Donald [name], male    DOB: [birth year], 66 y.o.   MRN: [id number]  HPI Pt returns for f/u of IDDM (dx'ed 1989; characterized by severe insulin resistance; complicated by peripheral sensory neuropathy, CAD, and nephropathy).  no cbg record, but states cbg's are well-controlled, except for a steroid injection.  denies hypoglycemia.  There is little if any trend throughout the day, but it is still highest in the afternoon.   Past Medical History  Diagnosis Date  . DIABETES MELLITUS, TYPE II 07/26/2006  . DYSLIPIDEMIA 04/13/2009  . ANEMIA-IRON DEFICIENCY 07/26/2006  . Pernicious anemia 11/20/2006  . DEPRESSION 03/14/2009  . PERIPHERAL NEUROPATHY 07/26/2006  . HYPERTENSION 07/26/2006  . PULMONARY EMBOLISM 10/05/2007  . D V T 12/03/2007  . ASTHMA 07/26/2006  . GERD 07/26/2006  . [place] DISEASE, LUMBAR 10/05/2007  . INSOMNIA 08/21/2007  . OBSTRUCTIVE SLEEP APNEA 12/03/2007  . COLONIC POLYPS, HX OF 07/26/2006    Past Surgical History  Procedure Date  . Appendectomy 1968  . Back surgery 1977, 06/14/2008    Dr. Trenton Gammon at [facility] (06/10)    History   Social History  . Marital Status: Married    Spouse Name: N/A    Number of Children: N/A  . Years of Education: N/A   Occupational History  . Disabled    Social History Main Topics  . Smoking status: Never Smoker   . Smokeless tobacco: Never Used  . Alcohol Use: No  . Drug Use: No  . Sexually Active: Not on file   Other Topics Concern  . 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 ALPRAZolam (XANAX) 0.5 MG tablet TAKE 1 TABLET AT BEDTIME AS NEEDED SLEEP  30 tablet  3  . aspirin 81 MG chewable tablet Chew 81 mg by mouth daily.      . cyanocobalamin  (,VITAMIN B-12,) 1000 MCG/ML injection 1 injection every 3 months       . EPINEPHrine (EPIPEN) 0.3 mg/0.3 mL DEVI Inject 0.3 mLs (0.3 mg total) into the muscle as needed.  2 Device  1  . famotidine (PEPCID) 20 MG tablet Take 1 tablet (20 mg total) by mouth 2 (two) times daily.  30 tablet  0  . furosemide (LASIX) 80 MG tablet TAKE ONE TAB BY MOUTH ONCE DAILY  30 tablet  7  . HUMULIN N 100 UNIT/ML injection INJECT 340 UNITS AT BEDTIME AS DIRECTED  100 mL  4  . HYDROcodone-acetaminophen (NORCO) 10-325 MG per tablet TAKE 1 TABLET EVERY 4 HOURS AS NEEDED FOR PAIN  30 tablet  3  . insulin lispro (HUMALOG) 100 UNIT/ML injection Three times daily (just before each meal) 170-150-230      . losartan (COZAAR) 100 MG tablet TAKE 1 TABLET (100 MG TOTAL) BY MOUTH DAILY.  90 tablet  3  . losartan (COZAAR) 100 MG tablet TAKE 1 TABLET (100 MG TOTAL) BY MOUTH DAILY.  90 tablet  3  . lovastatin (MEVACOR) 40 MG tablet TAKE 2 TAB BY MOUTH AT BEDTIME  60 tablet  7  . lovastatin (MEVACOR) 40 MG tablet TAKE 2 TAB BY MOUTH AT BEDTIME  60 tablet  7    Allergies  Allergen Reactions  . Codeine     REACTION: chest pain  .  Niacin-Lovastatin Er   . Pioglitazone     Family History  Problem Relation Age of Onset  . Cancer Mother     Breast Cancer  . Cancer Sister     Breast Cancer   BP 128/74  Pulse 74  Temp 98 F (36.7 C) (Oral)  Wt 288 lb (130.636 kg)  SpO2 98%  Review of Systems Denies weight change    Objective:   Physical Exam VITAL SIGNS:  See vs page GENERAL: no distress SKIN:  Insulin injection sites at the anterior abdomen are normal.    Lab Results  Component Value Date   HGBA1C 7.7* 12/25/2011      Assessment & Plan:  DM: considering he has had a steroid injection since last ov, he needs only slightly increased rx.

## 2011-12-26 ENCOUNTER — Telehealth: Payer: Self-pay | Admitting: *Deleted

## 2011-12-26 LAB — HEPATITIS B SURFACE ANTIBODY,QUALITATIVE: Hep B S Ab: NONREACTIVE

## 2011-12-26 NOTE — Telephone Encounter (Signed)
PATIENT NOTIFIED OF BLOOD SUGAR RESULTS AND INSTRUCTIONS PER DR. ELLISON TO INCREASE INSULIN AT LUNCH TIME TO 160 UNITS

## 2011-12-26 NOTE — Telephone Encounter (Signed)
Message copied by Ellene Route on Thu Dec 26, 2011  4:23 PM ------      Message from: Renato Shin      Created: Thu Dec 26, 2011  3:33 PM       please call patient:      Blood sugar is improved, but still a little high      Please increase lunch insulin to 160      i'll see you next time.

## 2012-01-06 DIAGNOSIS — D485 Neoplasm of uncertain behavior of skin: Secondary | ICD-10-CM | POA: Diagnosis not present

## 2012-01-13 DIAGNOSIS — G5600 Carpal tunnel syndrome, unspecified upper limb: Secondary | ICD-10-CM | POA: Diagnosis not present

## 2012-01-24 ENCOUNTER — Other Ambulatory Visit: Payer: Self-pay | Admitting: *Deleted

## 2012-01-24 MED ORDER — INSULIN LISPRO 100 UNIT/ML ~~LOC~~ SOLN: SUBCUTANEOUS | Status: DC

## 2012-02-10 ENCOUNTER — Other Ambulatory Visit: Payer: Self-pay | Admitting: Endocrinology

## 2012-02-10 MED ORDER — HYDROCODONE-ACETAMINOPHEN 10-325 MG PO TABS: 1.0000 | ORAL_TABLET | ORAL | Status: DC | PRN

## 2012-02-10 MED ORDER — ALPRAZOLAM 0.5 MG PO TABS: 0.5000 mg | ORAL_TABLET | Freq: Every evening | ORAL | Status: DC | PRN

## 2012-03-05 ENCOUNTER — Telehealth: Payer: Self-pay | Admitting: Pulmonary Disease

## 2012-03-05 DIAGNOSIS — J45909 Unspecified asthma, uncomplicated: Secondary | ICD-10-CM

## 2012-03-05 NOTE — Telephone Encounter (Signed)
lmomtcb x1   Will forward to Mindy to follow up on if this order was ever placed. I dont see anything in the pt chart in reference to an order being placed for replacement CPAP supplies. Pt was last seen 08/06/11 for yearly follow up and Donald [name] states in his AVS that order would be placed for supplies. Pt calling wanting to know status.   I called patient to try and find out if he ever received any supplies prior to this request (back in July)---lmomtcb x1  Mindy please advise further. Thanks.

## 2012-03-05 NOTE — Telephone Encounter (Signed)
If pt did not receive CPAP supplies then okay to send order.

## 2012-03-06 ENCOUNTER — Telehealth: Payer: Self-pay | Admitting: Pulmonary Disease

## 2012-03-06 NOTE — Telephone Encounter (Signed)
Pt states that they have never received an order I re sent an order to [facility] for neb supplies Nothing further needed per pt

## 2012-03-06 NOTE — Telephone Encounter (Signed)
lmtcb x2 

## 2012-03-06 NOTE — Telephone Encounter (Signed)
Error.Donald [name] ° °

## 2012-03-06 NOTE — Telephone Encounter (Signed)
Returning call.Donald [name] ° °

## 2012-03-09 ENCOUNTER — Telehealth: Payer: Self-pay | Admitting: Pulmonary Disease

## 2012-03-09 DIAGNOSIS — G4733 Obstructive sleep apnea (adult) (pediatric): Secondary | ICD-10-CM

## 2012-03-09 NOTE — Telephone Encounter (Signed)
Order sent to [facility] to give to [facility] for CPAP supplies(headgear), tubing. Also, looks as though doesn't need nebulizer machine-cancelled that order with [facility] as well(looked through OV notes to confirm this).   Left message that order has been placed and if anything else is needed to call the office.

## 2012-03-10 ENCOUNTER — Telehealth: Payer: Self-pay | Admitting: Pulmonary Disease

## 2012-03-10 NOTE — Telephone Encounter (Signed)
Called and spoke with Melissa. [facility] is needing the original sleep study. Looked in Geophysicist/field seismologist and study was not scanned. Asked HIM to check to see if they could locate in patient's chart. Waiting on response. Rhonda J Cobb

## 2012-03-11 ENCOUNTER — Other Ambulatory Visit: Payer: Self-pay | Admitting: Neurology

## 2012-03-11 MED ORDER — ALPRAZOLAM 0.5 MG PO TABS: 0.5000 mg | ORAL_TABLET | Freq: Every evening | ORAL | Status: DC | PRN

## 2012-03-12 NOTE — Telephone Encounter (Signed)
Located original sleep study from June 2000. Have sent copy down to HIM to be scanned into Epic. Called and spoke with Mr. Cancel and he is aware that study has been located and [facility] will have what they need to provide any supplies that he may need. Staff message sent to [facility]) to advise her that study has been located and is here to be picked up. Advised patient that if he hasn't heard from [facility] by the first of the week to let me know and I would follow up with them. Nothing else needed. Message closed. Rhonda J Cobb

## 2012-03-26 ENCOUNTER — Ambulatory Visit: Payer: Medicare Other | Admitting: Endocrinology

## 2012-04-01 ENCOUNTER — Ambulatory Visit: Payer: Medicare Other | Admitting: Endocrinology

## 2012-04-02 ENCOUNTER — Encounter: Payer: Self-pay | Admitting: Endocrinology

## 2012-04-02 ENCOUNTER — Ambulatory Visit (INDEPENDENT_AMBULATORY_CARE_PROVIDER_SITE_OTHER): Payer: Medicare Other | Admitting: Endocrinology

## 2012-04-02 VITALS — BP 128/74 | HR 65 | Wt 289.0 lb

## 2012-04-02 DIAGNOSIS — E119 Type 2 diabetes mellitus without complications: Secondary | ICD-10-CM

## 2012-04-02 LAB — HEMOGLOBIN A1C: Hgb A1c MFr Bld: 7.9 % — ABNORMAL HIGH (ref 4.6–6.5)

## 2012-04-02 NOTE — Patient Instructions (Addendum)
Please come back for a "medicare wellness" appointment in 3 months.  blood tests are being requested for you today.  We'll contact you with results.    check your blood sugar twice a day.  vary the time of day when you check, between before the 3 meals, and at bedtime.  also check if you have symptoms of your blood sugar being too high or too low.  please keep a record of the readings and bring it to your next appointment here.  please call us sooner if your blood sugar goes below 70, or if you have a lot of readings over 200.   pending the test results, please continue the same insulin for now.

## 2012-04-02 NOTE — Progress Notes (Signed)
Subjective:    Patient ID: Donald [name], male    DOB: 09/08/1945, 67 y.o.   MRN: [id number]  HPI Pt returns for f/u of IDDM (dx'ed 1989; characterized by severe insulin resistance; complicated by peripheral sensory neuropathy, CAD, and nephropathy; he has never had severe hypoglycemia or DKA).  no cbg record, but states cbg's are well-controlled.  There is little if any trend throughout the day, except it is highest at hs.   Past Medical History  Diagnosis Date  . DIABETES MELLITUS, TYPE II 07/26/2006  . DYSLIPIDEMIA 04/13/2009  . ANEMIA-IRON DEFICIENCY 07/26/2006  . Pernicious anemia 11/20/2006  . DEPRESSION 03/14/2009  . PERIPHERAL NEUROPATHY 07/26/2006  . HYPERTENSION 07/26/2006  . PULMONARY EMBOLISM 10/05/2007  . D V T 12/03/2007  . ASTHMA 07/26/2006  . GERD 07/26/2006  . [place] DISEASE, LUMBAR 10/05/2007  . INSOMNIA 08/21/2007  . OBSTRUCTIVE SLEEP APNEA 12/03/2007  . COLONIC POLYPS, HX OF 07/26/2006    Past Surgical History  Procedure Laterality Date  . Appendectomy  1968  . Back surgery  1977, 06/14/2008    Dr. Trenton Gammon at [facility] (06/10)    History   Social History  . Marital Status: Married    Spouse Name: N/A    Number of Children: N/A  . Years of Education: N/A   Occupational History  . Disabled    Social History Main Topics  . Smoking status: Never Smoker   . Smokeless tobacco: Never Used  . Alcohol Use: No  . Drug Use: No  . Sexually Active: Not on file   Other Topics Concern  . 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   Activity is severely limited by medical problems   Diet is "good".   Never a smoker   Alcohol: none          Current Outpatient Prescriptions on File Prior to Visit  Medication Sig Dispense Refill  . ALPRAZolam (XANAX) 0.5 MG tablet Take 1 tablet (0.5 mg total) by mouth at bedtime as needed for sleep.  30 tablet  5  . aspirin 81 MG chewable tablet Chew 81  mg by mouth daily.      . cyanocobalamin (,VITAMIN B-12,) 1000 MCG/ML injection 1 injection every 3 months       . EPINEPHrine (EPIPEN) 0.3 mg/0.3 mL DEVI Inject 0.3 mLs (0.3 mg total) into the muscle as needed.  2 Device  1  . famotidine (PEPCID) 20 MG tablet Take 1 tablet (20 mg total) by mouth 2 (two) times daily.  30 tablet  0  . furosemide (LASIX) 80 MG tablet TAKE ONE TAB BY MOUTH ONCE DAILY  30 tablet  7  . HUMULIN N 100 UNIT/ML injection INJECT 340 UNITS AT BEDTIME AS DIRECTED  100 mL  4  . HYDROcodone-acetaminophen (NORCO) 10-325 MG per tablet Take 1 tablet by mouth every 4 (four) hours as needed for pain.  50 tablet  5  . losartan (COZAAR) 100 MG tablet TAKE 1 TABLET (100 MG TOTAL) BY MOUTH DAILY.  90 tablet  3  . lovastatin (MEVACOR) 40 MG tablet TAKE 2 TAB BY MOUTH AT BEDTIME  60 tablet  7   No current facility-administered medications on file prior to visit.    Allergies  Allergen Reactions  . Codeine     REACTION: chest pain  . Niacin-Lovastatin Er   . Pioglitazone     Family History  Problem Relation Age of Onset  . Cancer Mother     Breast Cancer  . Cancer Sister     Breast Cancer    BP 128/74  Pulse 65  Wt 289 lb (131.09 kg)  BMI 45.25 kg/m2  SpO2 95%    Review of Systems denies hypoglycemia    Objective:   Physical Exam VITAL SIGNS:  See vs page GENERAL: no distress Pulses: dorsalis pedis intact bilat.   Feet: no deformity.  no ulcer on the feet.  feet are of normal color and temp.  trace bilat leg edema.  onychomycosis of toenails. Neuro: sensation is intact to touch on the feet.    Lab Results  Component Value Date   HGBA1C 7.9* 04/02/2012      Assessment & Plan:  DM: needs increased rx

## 2012-04-07 ENCOUNTER — Encounter: Payer: Self-pay | Admitting: Internal Medicine

## 2012-05-04 ENCOUNTER — Other Ambulatory Visit: Payer: Self-pay

## 2012-05-04 MED ORDER — FUROSEMIDE 80 MG PO TABS: ORAL_TABLET | ORAL | Status: DC

## 2012-06-12 ENCOUNTER — Other Ambulatory Visit: Payer: Self-pay | Admitting: *Deleted

## 2012-06-12 MED ORDER — INSULIN NPH (HUMAN) (ISOPHANE) 100 UNIT/ML ~~LOC~~ SUSP: SUBCUTANEOUS | Status: DC

## 2012-07-03 ENCOUNTER — Ambulatory Visit: Payer: Medicare Other | Admitting: Endocrinology

## 2012-07-08 ENCOUNTER — Encounter: Payer: Self-pay | Admitting: Endocrinology

## 2012-07-08 ENCOUNTER — Ambulatory Visit (INDEPENDENT_AMBULATORY_CARE_PROVIDER_SITE_OTHER): Payer: Medicare Other | Admitting: Endocrinology

## 2012-07-08 VITALS — BP 126/80 | HR 78 | Ht 66.0 in | Wt 284.0 lb

## 2012-07-08 DIAGNOSIS — E1049 Type 1 diabetes mellitus with other diabetic neurological complication: Secondary | ICD-10-CM

## 2012-07-08 DIAGNOSIS — E1065 Type 1 diabetes mellitus with hyperglycemia: Secondary | ICD-10-CM

## 2012-07-08 LAB — HEMOGLOBIN A1C: Hgb A1c MFr Bld: 7.6 % — ABNORMAL HIGH (ref 4.6–6.5)

## 2012-07-08 MED ORDER — CEFUROXIME AXETIL 250 MG PO TABS: 250.0000 mg | ORAL_TABLET | Freq: Two times a day (BID) | ORAL | Status: AC

## 2012-07-08 NOTE — Progress Notes (Signed)
Subjective:    Patient ID: Donald [name], male    DOB: [birth year], 67 y.o.   MRN: [id number]  HPI Pt states few days of slight pain at the right maxillary area, and assoc nasal congestion. Pt returns for f/u of IDDM (dx'ed 1989; characterized by severe insulin resistance; complicated by peripheral sensory neuropathy, CAD, and nephropathy; he has never had severe hypoglycemia or DKA).  no cbg record, but states he has had a few cbg's in the 60's, in the afternoon, or in the early hrs of the am. Past Medical History  Diagnosis Date  . DIABETES MELLITUS, TYPE II 07/26/2006  . DYSLIPIDEMIA 04/13/2009  . ANEMIA-IRON DEFICIENCY 07/26/2006  . Pernicious anemia 11/20/2006  . DEPRESSION 03/14/2009  . PERIPHERAL NEUROPATHY 07/26/2006  . HYPERTENSION 07/26/2006  . PULMONARY EMBOLISM 10/05/2007  . D V T 12/03/2007  . ASTHMA 07/26/2006  . GERD 07/26/2006  . [place] DISEASE, LUMBAR 10/05/2007  . INSOMNIA 08/21/2007  . OBSTRUCTIVE SLEEP APNEA 12/03/2007  . COLONIC POLYPS, HX OF 07/26/2006    Past Surgical History  Procedure Laterality Date  . Appendectomy  1968  . Back surgery  1977, 06/14/2008    Dr. Trenton Gammon at [facility] (06/10)    History   Social History  . Marital Status: Married    Spouse Name: N/A    Number of Children: N/A  . Years of Education: N/A   Occupational History  . Disabled    Social History Main Topics  . Smoking status: Never Smoker   . Smokeless tobacco: Never Used  . Alcohol Use: No  . Drug Use: No  . Sexually Active: Not on file   Other Topics Concern  . 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   Activity is severely limited by medical problems   Diet is "good".   Never a smoker   Alcohol: none          Current Outpatient Prescriptions on File Prior to Visit  Medication Sig Dispense Refill  . ALPRAZolam (XANAX) 0.5 MG tablet Take 1 tablet (0.5 mg total) by mouth at bedtime as  needed for sleep.  30 tablet  5  . aspirin 81 MG chewable tablet Chew 81 mg by mouth daily.      . cyanocobalamin (,VITAMIN B-12,) 1000 MCG/ML injection 1 injection every 3 months       . EPINEPHrine (EPIPEN) 0.3 mg/0.3 mL DEVI Inject 0.3 mLs (0.3 mg total) into the muscle as needed.  2 Device  1  . furosemide (LASIX) 80 MG tablet TAKE ONE TAB BY MOUTH ONCE DAILY  30 tablet  7  . HYDROcodone-acetaminophen (NORCO) 10-325 MG per tablet Take 1 tablet by mouth every 4 (four) hours as needed for pain.  50 tablet  5  . insulin lispro (HUMALOG) 100 UNIT/ML injection Three times daily (just before each meal) 170-150-250      . losartan (COZAAR) 100 MG tablet TAKE 1 TABLET (100 MG TOTAL) BY MOUTH DAILY.  90 tablet  3  . lovastatin (MEVACOR) 40 MG tablet TAKE 2 TAB BY MOUTH AT BEDTIME  60 tablet  7  . famotidine (PEPCID) 20 MG tablet Take 1 tablet (20 mg total) by mouth 2 (two) times daily.  30 tablet  0   No current facility-administered medications on file prior to visit.    Allergies  Allergen Reactions  . Codeine     REACTION: chest  pain  . Niacin-Lovastatin Er   . Pioglitazone     Family History  Problem Relation Age of Onset  . Cancer Mother     Breast Cancer  . Cancer Sister     Breast Cancer   BP 126/80  Pulse 78  Ht 5\' 6"  (1.676 m)  Wt 284 lb (128.822 kg)  BMI 45.86 kg/m2  SpO2 96%  Review of Systems He denies fever and earache.      Objective:   Physical Exam VITAL SIGNS:  See vs page GENERAL: no distress head: no deformity eyes: no periorbital swelling, no proptosis external nose and ears are normal mouth: no lesion seen Both eac's and tm's are normal.   Lab Results  Component Value Date   HGBA1C 7.6* 07/08/2012      Assessment & Plan:  URI, new DM: This insulin regimen was chosen from multiple options, as it best matches his insulin to his changing requirements throughout the day.  The benefits of glycemic control must be weighed against the risks of  hypoglycemia.  The pattern of his cbg's indicates he needs some adjustment in his therapy

## 2012-07-08 NOTE — Patient Instructions (Addendum)
Please come back for a regular physical appointment in 3 months.  blood tests are being requested for you today.  We'll contact you with results.    check your blood sugar twice a day.  vary the time of day when you check, between before the 3 meals, and at bedtime.  also check if you have symptoms of your blood sugar being too high or too low.  please keep a record of the readings and bring it to your next appointment here.  please call us sooner if your blood sugar goes below 70, or if you have a lot of readings over 200.   Please reduce the lunch humalog to 150 units.   Also, please reduce the NPH to 300 units at bedtime.   i have sent a prescription to your pharmacy, for an antibiotic pill. Loratadine-d (non-prescription) will help your congestion.

## 2012-08-10 ENCOUNTER — Other Ambulatory Visit: Payer: Self-pay | Admitting: *Deleted

## 2012-08-10 MED ORDER — LOVASTATIN 40 MG PO TABS: ORAL_TABLET | ORAL | Status: DC

## 2012-08-10 MED ORDER — ALPRAZOLAM 0.5 MG PO TABS: 0.5000 mg | ORAL_TABLET | Freq: Every evening | ORAL | Status: DC | PRN

## 2012-09-07 ENCOUNTER — Other Ambulatory Visit: Payer: Self-pay | Admitting: Endocrinology

## 2012-09-07 MED ORDER — HYDROCODONE-ACETAMINOPHEN 10-325 MG PO TABS: 1.0000 | ORAL_TABLET | ORAL | Status: DC | PRN

## 2012-09-28 ENCOUNTER — Other Ambulatory Visit: Payer: Self-pay | Admitting: *Deleted

## 2012-09-28 MED ORDER — LOSARTAN POTASSIUM 100 MG PO TABS: 100.0000 mg | ORAL_TABLET | Freq: Every day | ORAL | Status: DC

## 2012-10-08 ENCOUNTER — Ambulatory Visit (INDEPENDENT_AMBULATORY_CARE_PROVIDER_SITE_OTHER): Payer: Medicare Other | Admitting: Endocrinology

## 2012-10-08 ENCOUNTER — Encounter: Payer: Self-pay | Admitting: Endocrinology

## 2012-10-08 VITALS — BP 120/80 | HR 62 | Wt 288.0 lb

## 2012-10-08 DIAGNOSIS — E785 Hyperlipidemia, unspecified: Secondary | ICD-10-CM

## 2012-10-08 DIAGNOSIS — D510 Vitamin B12 deficiency anemia due to intrinsic factor deficiency: Secondary | ICD-10-CM

## 2012-10-08 DIAGNOSIS — R319 Hematuria, unspecified: Secondary | ICD-10-CM

## 2012-10-08 DIAGNOSIS — R809 Proteinuria, unspecified: Secondary | ICD-10-CM

## 2012-10-08 DIAGNOSIS — G609 Hereditary and idiopathic neuropathy, unspecified: Secondary | ICD-10-CM

## 2012-10-08 DIAGNOSIS — Z79899 Other long term (current) drug therapy: Secondary | ICD-10-CM

## 2012-10-08 DIAGNOSIS — E1049 Type 1 diabetes mellitus with other diabetic neurological complication: Secondary | ICD-10-CM

## 2012-10-08 DIAGNOSIS — E119 Type 2 diabetes mellitus without complications: Secondary | ICD-10-CM

## 2012-10-08 DIAGNOSIS — I10 Essential (primary) hypertension: Secondary | ICD-10-CM

## 2012-10-08 DIAGNOSIS — Z125 Encounter for screening for malignant neoplasm of prostate: Secondary | ICD-10-CM

## 2012-10-08 DIAGNOSIS — Z23 Encounter for immunization: Secondary | ICD-10-CM

## 2012-10-08 DIAGNOSIS — D509 Iron deficiency anemia, unspecified: Secondary | ICD-10-CM

## 2012-10-08 LAB — HEPATIC FUNCTION PANEL
AST: 20 U/L (ref 0–37)
Albumin: 3.9 g/dL (ref 3.5–5.2)
Alkaline Phosphatase: 63 U/L (ref 39–117)
Bilirubin, Direct: 0.2 mg/dL (ref 0.0–0.3)
Total Protein: 6.7 g/dL (ref 6.0–8.3)

## 2012-10-08 LAB — IBC PANEL
Iron: 61 ug/dL (ref 42–165)
Saturation Ratios: 17.9 % — ABNORMAL LOW (ref 20.0–50.0)
Transferrin: 243.5 mg/dL (ref 212.0–360.0)

## 2012-10-08 LAB — CBC WITH DIFFERENTIAL/PLATELET
Basophils Absolute: 0 10*3/uL (ref 0.0–0.1)
Basophils Relative: 0.4 % (ref 0.0–3.0)
Eosinophils Absolute: 0.3 10*3/uL (ref 0.0–0.7)
Eosinophils Relative: 3.1 % (ref 0.0–5.0)
HCT: 38.2 % — ABNORMAL LOW (ref 39.0–52.0)
Hemoglobin: 12.9 g/dL — ABNORMAL LOW (ref 13.0–17.0)
Lymphs Abs: 1.9 10*3/uL (ref 0.7–4.0)
MCHC: 33.7 g/dL (ref 30.0–36.0)
MCV: 84.8 fl (ref 78.0–100.0)
Monocytes Absolute: 0.5 10*3/uL (ref 0.1–1.0)
Neutro Abs: 5.7 10*3/uL (ref 1.4–7.7)
RBC: 4.5 Mil/uL (ref 4.22–5.81)
RDW: 14.9 % — ABNORMAL HIGH (ref 11.5–14.6)

## 2012-10-08 LAB — URINALYSIS, ROUTINE W REFLEX MICROSCOPIC
Bilirubin Urine: NEGATIVE
Ketones, ur: NEGATIVE
Leukocytes, UA: NEGATIVE
Specific Gravity, Urine: 1.025 (ref 1.000–1.030)
Urine Glucose: NEGATIVE
Urobilinogen, UA: 0.2 (ref 0.0–1.0)

## 2012-10-08 LAB — BASIC METABOLIC PANEL
BUN: 29 mg/dL — ABNORMAL HIGH (ref 6–23)
CO2: 29 mEq/L (ref 19–32)
Chloride: 106 mEq/L (ref 96–112)
Creatinine, Ser: 1.7 mg/dL — ABNORMAL HIGH (ref 0.4–1.5)
Potassium: 4.1 mEq/L (ref 3.5–5.1)

## 2012-10-08 LAB — LIPID PANEL
Cholesterol: 95 mg/dL (ref 0–200)
HDL: 23.8 mg/dL — ABNORMAL LOW (ref >39.00)
LDL Cholesterol: 38 mg/dL (ref 0–99)
Total CHOL/HDL Ratio: 4
Triglycerides: 164 mg/dL — ABNORMAL HIGH (ref 0.0–149.0)
VLDL: 32.8 mg/dL (ref 0.0–40.0)

## 2012-10-08 LAB — MICROALBUMIN / CREATININE URINE RATIO: Creatinine,U: 154.6 mg/dL

## 2012-10-08 MED ORDER — SILDENAFIL CITRATE 50 MG PO TABS: 50.0000 mg | ORAL_TABLET | Freq: Every day | ORAL | Status: DC | PRN

## 2012-10-08 MED ORDER — ALPRAZOLAM 1 MG PO TABS: 1.0000 mg | ORAL_TABLET | Freq: Every evening | ORAL | Status: DC | PRN

## 2012-10-08 NOTE — Patient Instructions (Addendum)
Please come back for a regular physical appointment in january.  blood tests are being requested for you today.  We'll contact you with results.    check your blood sugar twice a day.  vary the time of day when you check, between before the 3 meals, and at bedtime.  also check if you have symptoms of your blood sugar being too high or too low.  please keep a record of the readings and bring it to your next appointment here.  please call us sooner if your blood sugar goes below 70, or if you have a lot of readings over 200.   Please double the alprazolam.  Here is a prescription.

## 2012-10-08 NOTE — Progress Notes (Signed)
Subjective:    Patient ID: Donald [name], male    DOB: [birth year], 67 y.o.   MRN: [id number]  HPI Pt returns for f/u of IDDM (dx'ed 1989; characterized by severe insulin resistance; he has moderate neuropathy of the lower extremities; he has associated CAD and nephropathy; he has never had severe hypoglycemia or DKA).  he brings a record of his cbg's which i have reviewed today.  It varies from 76-140.  It is highest in am, and lowest in the afternoon.  low-back pain is improved.  Past Medical History  Diagnosis Date  . DIABETES MELLITUS, TYPE II 07/26/2006  . DYSLIPIDEMIA 04/13/2009  . ANEMIA-IRON DEFICIENCY 07/26/2006  . Pernicious anemia 11/20/2006  . DEPRESSION 03/14/2009  . PERIPHERAL NEUROPATHY 07/26/2006  . HYPERTENSION 07/26/2006  . PULMONARY EMBOLISM 10/05/2007  . D V T 12/03/2007  . ASTHMA 07/26/2006  . GERD 07/26/2006  . [place] DISEASE, LUMBAR 10/05/2007  . INSOMNIA 08/21/2007  . OBSTRUCTIVE SLEEP APNEA 12/03/2007  . COLONIC POLYPS, HX OF 07/26/2006    Past Surgical History  Procedure Laterality Date  . Appendectomy  1968  . Back surgery  1977, 06/14/2008    Dr. Trenton Gammon at [facility] (06/10)    History   Social History  . Marital Status: Married    Spouse Name: N/A    Number of Children: N/A  . Years of Education: N/A   Occupational History  . Disabled    Social History Main Topics  . Smoking status: Never Smoker   . Smokeless tobacco: Never Used  . Alcohol Use: No  . Drug Use: No  . Sexual Activity: Not on file   Other Topics Concern  . 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   Activity is severely limited by medical problems   Diet is "good".   Never a smoker   Alcohol: none          Current Outpatient Prescriptions on File Prior to Visit  Medication Sig Dispense Refill  . aspirin 81 MG chewable tablet Chew 81 mg by mouth daily.      . cyanocobalamin (,VITAMIN B-12,) 1000  MCG/ML injection 1 injection every 3 months       . EPINEPHrine (EPIPEN) 0.3 mg/0.3 mL DEVI Inject 0.3 mLs (0.3 mg total) into the muscle as needed.  2 Device  1  . furosemide (LASIX) 80 MG tablet TAKE ONE TAB BY MOUTH ONCE DAILY  30 tablet  7  . HYDROcodone-acetaminophen (NORCO) 10-325 MG per tablet Take 1 tablet by mouth every 4 (four) hours as needed for pain.  50 tablet  5  . insulin lispro (HUMALOG) 100 UNIT/ML injection Three times daily (just before each meal) 170-150-250      . insulin NPH (HUMULIN N,NOVOLIN N) 100 UNIT/ML injection Inject 325 Units into the skin at bedtime.       [place] losartan (COZAAR) 100 MG tablet Take 1 tablet (100 mg total) by mouth daily.  90 tablet  2  . lovastatin (MEVACOR) 40 MG tablet TAKE 2 TABLETS BY MOUTH AT BEDTIME.  60 tablet  4  . famotidine (PEPCID) 20 MG tablet Take 1 tablet (20 mg total) by mouth 2 (two) times daily.  30 tablet  0   No current facility-administered medications on file prior to visit.    Allergies  Allergen Reactions  . Codeine     REACTION: chest pain  . Niacin-Lovastatin  Er   . Pioglitazone    Family History  Problem Relation Age of Onset  . Cancer Mother     Breast Cancer  . Cancer Sister     Breast Cancer   BP 120/80  Pulse 62  Wt 288 lb (130.636 kg)  BMI 46.51 kg/m2  SpO2 96%  Review of Systems Chronic insomnia persists.  denies hypoglycemia.      Objective:   Physical Exam VITAL SIGNS:  See vs page GENERAL: no distress.    Lab Results  Component Value Date   WBC 8.5 10/08/2012   HGB 12.9* 10/08/2012   HCT 38.2* 10/08/2012   PLT 199.0 10/08/2012   GLUCOSE 74 10/08/2012   CHOL 95 10/08/2012   TRIG 164.0* 10/08/2012   HDL 23.80* 10/08/2012   LDLDIRECT 52.2 05/25/2009   LDLCALC 38 10/08/2012   ALT 22 10/08/2012   AST 20 10/08/2012   NA 143 10/08/2012   K 4.1 10/08/2012   CL 106 10/08/2012   CREATININE 1.7* 10/08/2012   BUN 29* 10/08/2012   CO2 29 10/08/2012   TSH 3.32 10/08/2012   PSA 0.60 10/08/2012   INR 1.17  11/09/2010   HGBA1C 7.7* 10/08/2012   MICROALBUR 8.1* 10/08/2012      Assessment & Plan:  DM: This insulin regimen was chosen from multiple options, as it best matches his insulin to his changing requirements throughout the day.  The benefits of glycemic control must be weighed against the risks of hypoglycemia.  He needs increased rx Low-back pain: improved Insomnia: worse Renal insuff: stable.

## 2012-11-09 ENCOUNTER — Telehealth: Payer: Self-pay

## 2012-11-09 NOTE — Telephone Encounter (Signed)
Received an rx request for alprazolam 1 mg tablet- take 1 tablet at bedtime as needed for sleep. #30x 1 rf on 10/08/12.  Pt last seen same day.  Pls advise.

## 2012-11-09 NOTE — Telephone Encounter (Signed)
Please advise pt to claim the refill she still has

## 2012-11-10 NOTE — Telephone Encounter (Signed)
Called the pharmacy and spoke with Coralyn Mark; pt has 1 refill remaining.  Called to make pt aware.   Pt states the last time he was here a refill of viagra was given. Pt states the medication would cost 170.00 for 5 pills.  The pharmacist gave pt the name of sildenafil 20 mg.  Pt states he could get #90 for 67.00.  Pls advise.

## 2012-12-07 ENCOUNTER — Other Ambulatory Visit: Payer: Self-pay | Admitting: *Deleted

## 2012-12-07 MED ORDER — LOSARTAN POTASSIUM 100 MG PO TABS: 100.0000 mg | ORAL_TABLET | Freq: Every day | ORAL | Status: DC

## 2012-12-07 MED ORDER — ALPRAZOLAM 1 MG PO TABS: 1.0000 mg | ORAL_TABLET | Freq: Every evening | ORAL | Status: DC | PRN

## 2012-12-07 NOTE — Telephone Encounter (Signed)
Rx refill

## 2012-12-21 ENCOUNTER — Other Ambulatory Visit: Payer: Self-pay

## 2012-12-21 MED ORDER — INSULIN NPH (HUMAN) (ISOPHANE) 100 UNIT/ML ~~LOC~~ SUSP: SUBCUTANEOUS | Status: DC

## 2012-12-21 NOTE — Telephone Encounter (Signed)
Refill for Humulin N.

## 2012-12-22 DIAGNOSIS — D235 Other benign neoplasm of skin of trunk: Secondary | ICD-10-CM | POA: Diagnosis not present

## 2012-12-22 DIAGNOSIS — L821 Other seborrheic keratosis: Secondary | ICD-10-CM | POA: Diagnosis not present

## 2012-12-29 DIAGNOSIS — J018 Other acute sinusitis: Secondary | ICD-10-CM | POA: Diagnosis not present

## 2013-01-04 ENCOUNTER — Other Ambulatory Visit: Payer: Self-pay | Admitting: *Deleted

## 2013-01-04 MED ORDER — LOSARTAN POTASSIUM 100 MG PO TABS: 100.0000 mg | ORAL_TABLET | Freq: Every day | ORAL | Status: DC

## 2013-01-11 DIAGNOSIS — J45909 Unspecified asthma, uncomplicated: Secondary | ICD-10-CM | POA: Diagnosis not present

## 2013-01-11 DIAGNOSIS — J018 Other acute sinusitis: Secondary | ICD-10-CM | POA: Diagnosis not present

## 2013-01-12 ENCOUNTER — Other Ambulatory Visit: Payer: Self-pay | Admitting: *Deleted

## 2013-01-12 MED ORDER — LOVASTATIN 40 MG PO TABS: ORAL_TABLET | ORAL | Status: DC

## 2013-01-22 ENCOUNTER — Encounter: Payer: Self-pay | Admitting: Adult Health

## 2013-01-22 ENCOUNTER — Ambulatory Visit (INDEPENDENT_AMBULATORY_CARE_PROVIDER_SITE_OTHER): Payer: Medicare Other | Admitting: Adult Health

## 2013-01-22 ENCOUNTER — Ambulatory Visit (INDEPENDENT_AMBULATORY_CARE_PROVIDER_SITE_OTHER)
Admission: RE | Admit: 2013-01-22 | Discharge: 2013-01-22 | Disposition: A | Discharge status: Home / Self Care | Payer: Medicare Other | Source: Ambulatory Visit | Attending: Adult Health | Admitting: Adult Health

## 2013-01-22 VITALS — BP 138/64 | HR 64 | Temp 98.0°F | Ht 66.5 in | Wt 289.0 lb

## 2013-01-22 DIAGNOSIS — R05 Cough: Secondary | ICD-10-CM | POA: Diagnosis not present

## 2013-01-22 DIAGNOSIS — R0602 Shortness of breath: Secondary | ICD-10-CM | POA: Diagnosis not present

## 2013-01-22 DIAGNOSIS — R059 Cough, unspecified: Secondary | ICD-10-CM | POA: Diagnosis not present

## 2013-01-22 DIAGNOSIS — J45909 Unspecified asthma, uncomplicated: Secondary | ICD-10-CM | POA: Diagnosis not present

## 2013-01-22 MED ORDER — HYDROCODONE-HOMATROPINE 5-1.5 MG/5ML PO SYRP: 5.0000 mL | ORAL_SOLUTION | Freq: Four times a day (QID) | ORAL | Status: DC | PRN

## 2013-01-22 MED ORDER — LEVALBUTEROL HCL 0.63 MG/3ML IN NEBU
0.6300 mg | INHALATION_SOLUTION | Freq: Once | RESPIRATORY_TRACT | Status: AC
  Administered 2013-01-22: 0.63 mg via RESPIRATORY_TRACT

## 2013-01-22 NOTE — Addendum Note (Signed)
Addended by: Parke Poisson E on: 01/22/2013 12:27 PM   Modules accepted: Orders

## 2013-01-22 NOTE — Progress Notes (Signed)
  Subjective:    Patient ID: Donald [name], male    DOB: Mar 28, 1945, 68 y.o.   MRN: [id number]  HPI 43yowm never smoker for FU of obstructive sleep apnea & asthmatic bronchitis 1-2 episodes /year.  He developed DVT/pulmonary embolism 9/09 -seemingly idiopathic,subseq underwent back surgery 6/10 by Dr Trenton Gammon- Off coumadin since 10/10  He also had a chronic cough, which was attributed to GERD.  05/18/08 >> 50 mg trazodone helping with sleep, 300 mg made him 'wired'    6/00: Polysomnogram at [facility], RDI 138/h, lowest desatn 77%,maintained on CPAP+ 15 with a nasal mask. ([facility], [place]) since.  9/09 >> duplex >>echogenic material is seen in the left profunda femoral, femoral and popliteal veins.  02/18/08 >>residual thrombus in left prox to distal SFV  4/10 residual thrombus in Lt SFV persists  9/09 >> spiral CT >>Extensive bilateral pulmonary embolism is seen in both right and left pulmonary arteries as well as their lobar branches.  12/07/07 CT angio shows incomplete recanalisation of pulmonary arteries   07/2011  Last seen March 26, 2010- - rpt cpap titration at [place], on 15 cm C/o back pain Off trazodone now on xanax 0.5 mg q hs - has hangover efect Compliant with CPAP, mask ok, pressure ok, no dryness >>no changes    01/22/2013 Acute OV  Patient complains of dry cough, wheezing, hoarseness, head congestion, PND, clear mucus production x3 weeks.  Went to ENT at onset and given Levaquin x2 rounds & Pred X1. Remains on Pulmicort twice daily. Has been using Ventolin more than usual. Patient denies any hemoptysis, chest pain, orthopnea, PND or f/c/s, nausea, vomiting, chest tightness. Prednisone caused his blood sugars to be very high. He denies any polyuria or polydipsia Chest x-ray today shows  Prolonged talking, and cold air makes his cough and wheezing, worse   Review of Systems  Patient denies significant dyspnea,cough, hemoptysis,  chest pain,  palpitations, pedal edema, orthopnea, paroxysmal nocturnal dyspnea, lightheadedness, nausea, vomiting, abdominal or  leg pains      Objective:   Physical Exam   Gen. Pleasant, obese , in no distress ENT - no lesions, no post nasal drip Neck: No JVD, no thyromegaly, no carotid bruits Lungs: no use of accessory muscles, no dullness to percussion, faint exp wheezes  Cardiovascular: Rhythm regular, heart sounds  normal, no murmurs or gallops, no peripheral edema Musculoskeletal: No deformities, no cyanosis or clubbing   CXR 01/22/2013 >NAD      Assessment & Plan:

## 2013-01-22 NOTE — Patient Instructions (Signed)
Stop Pulmicort. Begin Dulera 2 puffs Twice daily  Until sample is gone , then go back to Pulmicort  Mucinex DM twice daily as needed. For cough and congestion. Chlortrimeton 4mg  2 At bedtime  As needed  Drainage .  Hydromet 1 tsp every 8hrs  As needed  Cough, may make you sleepy.  follow up Dr. Elsworth Soho  6-8 weeks and As needed   Please contact office for sooner follow up if symptoms do not improve or worsen or seek emergency care

## 2013-01-22 NOTE — Assessment & Plan Note (Addendum)
Slow to resolve, exacerbation, secondary to recent sinusitis, and bronchitis. Chest x-ray today shows. NAD  Xopenex nebulizer treatment given in office x1.  Plan  Stop Pulmicort. Begin Dulera 2 puffs Twice daily  Until sample is gone , then go back to Pulmicort  Mucinex DM twice daily as needed. For cough and congestion. Chlortrimeton 4mg  2 At bedtime  As needed  Drainage .  Hydromet 1 tsp every 8hrs  As needed  Cough, may make you sleepy.  follow up Dr. Elsworth Soho  6-8 weeks and As needed   Please contact office for sooner follow up if symptoms do not improve or worsen or seek emergency care

## 2013-01-28 ENCOUNTER — Other Ambulatory Visit: Payer: Medicare Other

## 2013-02-04 ENCOUNTER — Encounter: Payer: Medicare Other | Admitting: Endocrinology

## 2013-02-05 DIAGNOSIS — M25569 Pain in unspecified knee: Secondary | ICD-10-CM | POA: Diagnosis not present

## 2013-02-08 ENCOUNTER — Other Ambulatory Visit: Payer: Self-pay | Admitting: Endocrinology

## 2013-02-09 ENCOUNTER — Other Ambulatory Visit: Payer: Self-pay | Admitting: *Deleted

## 2013-02-09 MED ORDER — ALPRAZOLAM 1 MG PO TABS: 1.0000 mg | ORAL_TABLET | Freq: Every evening | ORAL | Status: DC | PRN

## 2013-02-17 ENCOUNTER — Telehealth: Payer: Self-pay

## 2013-02-17 NOTE — Telephone Encounter (Signed)
No, please do not delay.  Come in asap. Please ask pt to call back and report most up to date cbg's

## 2013-02-17 NOTE — Telephone Encounter (Signed)
Pt called stating that he has recently been put on Prednisone and receiving Cortisone injections from [facility]. Dr, Barns from [facility] stated that it might be a good idea to post pone physical due to high sugar reading. Pt states that reading have been in the high 200s and low 300s.  Please advise, Thanks!

## 2013-02-18 NOTE — Telephone Encounter (Signed)
Pt instructed to come for appointment on 2/17. Also Pt states for the past week sugars have been in the 120s and sometimes 90s.

## 2013-02-19 DIAGNOSIS — M76899 Other specified enthesopathies of unspecified lower limb, excluding foot: Secondary | ICD-10-CM | POA: Diagnosis not present

## 2013-02-22 ENCOUNTER — Other Ambulatory Visit: Payer: Medicare Other

## 2013-02-24 ENCOUNTER — Other Ambulatory Visit: Payer: Medicare Other

## 2013-02-24 ENCOUNTER — Other Ambulatory Visit: Payer: Self-pay

## 2013-02-24 ENCOUNTER — Ambulatory Visit: Payer: Medicare Other

## 2013-02-24 DIAGNOSIS — D509 Iron deficiency anemia, unspecified: Secondary | ICD-10-CM

## 2013-02-24 DIAGNOSIS — E1065 Type 1 diabetes mellitus with hyperglycemia: Principal | ICD-10-CM

## 2013-02-24 DIAGNOSIS — E1049 Type 1 diabetes mellitus with other diabetic neurological complication: Secondary | ICD-10-CM

## 2013-02-24 LAB — CBC WITH DIFFERENTIAL/PLATELET
Basophils Absolute: 0.1 10*3/uL (ref 0.0–0.1)
Basophils Relative: 0.8 % (ref 0.0–3.0)
EOS ABS: 0.3 10*3/uL (ref 0.0–0.7)
Eosinophils Relative: 3.7 % (ref 0.0–5.0)
HCT: 44.3 % (ref 39.0–52.0)
Hemoglobin: 13.8 g/dL (ref 13.0–17.0)
Lymphocytes Relative: 22.4 % (ref 12.0–46.0)
Lymphs Abs: 2 10*3/uL (ref 0.7–4.0)
MCHC: 31.3 g/dL (ref 30.0–36.0)
MCV: 87.8 fl (ref 78.0–100.0)
Monocytes Absolute: 0.5 10*3/uL (ref 0.1–1.0)
Monocytes Relative: 5.9 % (ref 3.0–12.0)
NEUTROS PCT: 67.2 % (ref 43.0–77.0)
Neutro Abs: 6.1 10*3/uL (ref 1.4–7.7)
Platelets: 188 10*3/uL (ref 150.0–400.0)
RBC: 5.04 Mil/uL (ref 4.22–5.81)
RDW: 15.7 % — ABNORMAL HIGH (ref 11.5–14.6)
WBC: 9.1 10*3/uL (ref 4.5–10.5)

## 2013-02-24 LAB — IBC PANEL
IRON: 76 ug/dL (ref 42–165)
Saturation Ratios: 21.8 % (ref 20.0–50.0)
Transferrin: 249.2 mg/dL (ref 212.0–360.0)

## 2013-02-24 LAB — HEMOGLOBIN A1C: HEMOGLOBIN A1C: 7.3 % — AB (ref 4.6–6.5)

## 2013-02-25 DIAGNOSIS — M25569 Pain in unspecified knee: Secondary | ICD-10-CM | POA: Diagnosis not present

## 2013-02-25 DIAGNOSIS — M2390 Unspecified internal derangement of unspecified knee: Secondary | ICD-10-CM | POA: Diagnosis not present

## 2013-02-25 DIAGNOSIS — M76899 Other specified enthesopathies of unspecified lower limb, excluding foot: Secondary | ICD-10-CM | POA: Diagnosis not present

## 2013-02-25 LAB — BASIC METABOLIC PANEL
BUN: 27 mg/dL — AB (ref 6–23)
CHLORIDE: 102 meq/L (ref 96–112)
CO2: 33 mEq/L — ABNORMAL HIGH (ref 19–32)
CREATININE: 1.6 mg/dL — AB (ref 0.4–1.5)
Calcium: 9.1 mg/dL (ref 8.4–10.5)
GFR: 45.33 mL/min — ABNORMAL LOW (ref >60.00)
GLUCOSE: 42 mg/dL — AB (ref 70–99)
POTASSIUM: 3.8 meq/L (ref 3.5–5.1)
Sodium: 147 mEq/L — ABNORMAL HIGH (ref 135–145)

## 2013-03-01 ENCOUNTER — Encounter: Payer: Self-pay | Admitting: Internal Medicine

## 2013-03-01 DIAGNOSIS — IMO0001 Reserved for inherently not codable concepts without codable children: Secondary | ICD-10-CM | POA: Diagnosis not present

## 2013-03-01 DIAGNOSIS — M25659 Stiffness of unspecified hip, not elsewhere classified: Secondary | ICD-10-CM | POA: Diagnosis not present

## 2013-03-01 DIAGNOSIS — M25569 Pain in unspecified knee: Secondary | ICD-10-CM | POA: Diagnosis not present

## 2013-03-01 DIAGNOSIS — M238X9 Other internal derangements of unspecified knee: Secondary | ICD-10-CM | POA: Diagnosis not present

## 2013-03-01 DIAGNOSIS — M25559 Pain in unspecified hip: Secondary | ICD-10-CM | POA: Diagnosis not present

## 2013-03-01 DIAGNOSIS — M2569 Stiffness of other specified joint, not elsewhere classified: Secondary | ICD-10-CM | POA: Diagnosis not present

## 2013-03-02 ENCOUNTER — Ambulatory Visit: Payer: Medicare Other | Admitting: Pulmonary Disease

## 2013-03-02 ENCOUNTER — Encounter: Payer: Medicare Other | Admitting: Endocrinology

## 2013-03-08 ENCOUNTER — Encounter: Payer: Self-pay | Admitting: Endocrinology

## 2013-03-08 ENCOUNTER — Ambulatory Visit (INDEPENDENT_AMBULATORY_CARE_PROVIDER_SITE_OTHER): Payer: Medicare Other | Admitting: Endocrinology

## 2013-03-08 VITALS — BP 122/86 | HR 72 | Temp 97.8°F | Ht 66.5 in | Wt 286.0 lb

## 2013-03-08 DIAGNOSIS — E119 Type 2 diabetes mellitus without complications: Secondary | ICD-10-CM

## 2013-03-08 DIAGNOSIS — Z0000 Encounter for general adult medical examination without abnormal findings: Secondary | ICD-10-CM

## 2013-03-08 MED ORDER — SILDENAFIL CITRATE 20 MG PO TABS: ORAL_TABLET | ORAL | Status: DC

## 2013-03-08 MED ORDER — PROMETHAZINE-CODEINE 6.25-10 MG/5ML PO SYRP: 5.0000 mL | ORAL_SOLUTION | ORAL | Status: DC | PRN

## 2013-03-08 NOTE — Progress Notes (Signed)
Subjective:    Patient ID: Donald [name], male    DOB: 11-Aug-1945, 68 y.o.   MRN: [id number]  HPI Pt returns for f/u of IDDM (dx'ed 1989; characterized by severe insulin resistance; he has moderate neuropathy of the lower extremities; he has associated CAD and nephropathy; he has never had severe hypoglycemia or DKA; he takes multiple daily injections).  he brings a record of his cbg's which i have reviewed today.  It is well-controlled, except for the steroid injections and PO courses (OA and acute bronchitis).   Past Medical History  Diagnosis Date  . DIABETES MELLITUS, TYPE II 07/26/2006  . DYSLIPIDEMIA 04/13/2009  . ANEMIA-IRON DEFICIENCY 07/26/2006  . Pernicious anemia 11/20/2006  . DEPRESSION 03/14/2009  . PERIPHERAL NEUROPATHY 07/26/2006  . HYPERTENSION 07/26/2006  . PULMONARY EMBOLISM 10/05/2007  . D V T 12/03/2007  . ASTHMA 07/26/2006  . GERD 07/26/2006  . [place] DISEASE, LUMBAR 10/05/2007  . INSOMNIA 08/21/2007  . OBSTRUCTIVE SLEEP APNEA 12/03/2007  . COLONIC POLYPS, HX OF 07/26/2006    Past Surgical History  Procedure Laterality Date  . Appendectomy  1968  . Back surgery  1977, 06/14/2008    Dr. Trenton Gammon at [facility] (06/10)    History   Social History  . Marital Status: Married    Spouse Name: N/A    Number of Children: N/A  . Years of Education: N/A   Occupational History  . Disabled    Social History Main Topics  . Smoking status: Never Smoker   . Smokeless tobacco: Never Used  . Alcohol Use: No  . Drug Use: No  . Sexual Activity: Not on file   Other Topics Concern  . 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   Activity is severely limited by medical problems   Diet is "good".   Never a smoker   Alcohol: none          Current Outpatient Prescriptions on File Prior to Visit  Medication Sig Dispense Refill  . ALPRAZolam (XANAX) 1 MG tablet Take 1 tablet (1 mg total) by mouth  at bedtime as needed for sleep.  30 tablet  2  . aspirin 81 MG chewable tablet Chew 81 mg by mouth daily.      . cyanocobalamin (,VITAMIN B-12,) 1000 MCG/ML injection 1 injection every 3 months       . EPINEPHrine (EPIPEN) 0.3 mg/0.3 mL DEVI Inject 0.3 mLs (0.3 mg total) into the muscle as needed.  2 Device  1  . furosemide (LASIX) 80 MG tablet TAKE ONE TAB BY MOUTH ONCE DAILY  30 tablet  7  . HUMALOG 100 UNIT/ML injection INJECT 3 TIMES DAILY (JUST BEFORE EACH MEAL) 170-150-230  100 mL  5  . HYDROcodone-acetaminophen (NORCO) 10-325 MG per tablet Take 1 tablet by mouth every 4 (four) hours as needed for pain.  50 tablet  5  . insulin lispro (HUMALOG) 100 UNIT/ML injection Three times daily (just before each meal) 170-150-250      . losartan (COZAAR) 100 MG tablet Take 1 tablet (100 mg total) by mouth daily. Take 2 tablets by mouth at bedtime  60 tablet  5  . lovastatin (MEVACOR) 40 MG tablet TAKE 2 TABLETS BY MOUTH AT BEDTIME.  60 tablet  5  . PULMICORT FLEXHALER 180 MCG/ACT inhaler Inhale 2 puffs into the lungs 2 (two) times daily.      Marland Kitchen  VENTOLIN HFA 108 (90 BASE) MCG/ACT inhaler 2 puffs every 4-6 hours as needed for wheezing/shortness of breath      . famotidine (PEPCID) 20 MG tablet Take 1 tablet (20 mg total) by mouth 2 (two) times daily.  30 tablet  0   No current facility-administered medications on file prior to visit.    Allergies  Allergen Reactions  . Codeine     REACTION: chest pain  . Niacin-Lovastatin Er   . Pioglitazone     REACTION to Actos: swelling in ankles    Family History  Problem Relation Age of Onset  . Cancer Mother     Breast Cancer  . Cancer Sister     Breast Cancer    BP 122/86  Pulse 72  Temp(Src) 97.8 F (36.6 C) (Oral)  Ht 5' 6.5" (1.689 m)  Wt 286 lb (129.729 kg)  BMI 45.48 kg/m2  SpO2 97%  Review of Systems He has mild hypoglycemia in am, approx once a week.  He has recovered from recent URI, but cough persists.    Objective:   Physical  Exam VITAL SIGNS:  See vs page GENERAL: no distress   Lab Results  Component Value Date   WBC 9.1 02/24/2013   HGB 13.8 02/24/2013   HCT 44.3 02/24/2013   PLT 188.0 02/24/2013   GLUCOSE 42* 02/24/2013   CHOL 95 10/08/2012   TRIG 164.0* 10/08/2012   HDL 23.80* 10/08/2012   LDLDIRECT 52.2 05/25/2009   LDLCALC 38 10/08/2012   ALT 22 10/08/2012   AST 20 10/08/2012   NA 147* 02/24/2013   K 3.8 02/24/2013   CL 102 02/24/2013   CREATININE 1.6* 02/24/2013   BUN 27* 02/24/2013   CO2 33* 02/24/2013   TSH 3.32 10/08/2012   PSA 0.60 10/08/2012   INR 1.17 11/09/2010   HGBA1C 7.3* 02/24/2013   MICROALBUR 8.1* 10/08/2012      Assessment & Plan:  DM: apparently slightly overcontrolled. URI, with persistent cough. Renal insuff: stable.    Subjective:   Patient here for Medicare annual wellness visit and management of other chronic and acute problems.     Risk factors: advanced age    70 of Physicians Providing Medical Care to Patient:  See "snapshot"   Activities of Daily Living: In your present state of health, do you have any difficulty performing the following activities?:  Preparing food and eating?: No  Bathing yourself: No  Getting dressed: No  Using the toilet:No  Moving around from place to place: No  In the past year have you fallen or had a near fall?: No    Home Safety: Has smoke detector and wears seat belts. No firearms. No excess sun exposure.  Diet and Exercise  Current exercise habits: limited by health probs Dietary issues discussed: pt states a healthy diet   Depression Screen  Q1: Over the past two weeks, have you felt down, depressed or hopeless?no  Q2: Over the past two weeks, have you felt little interest or pleasure in doing things? no   The following portions of the patient's history were reviewed and updated as appropriate: allergies, current medications, past family history, past medical history, past social history, past surgical history and problem list.    Past Medical History  Diagnosis Date  . DIABETES MELLITUS, TYPE II 07/26/2006  . DYSLIPIDEMIA 04/13/2009  . ANEMIA-IRON DEFICIENCY 07/26/2006  . Pernicious anemia 11/20/2006  . DEPRESSION 03/14/2009  . PERIPHERAL NEUROPATHY 07/26/2006  . HYPERTENSION 07/26/2006  . PULMONARY EMBOLISM  10/05/2007  . D V T 12/03/2007  . ASTHMA 07/26/2006  . GERD 07/26/2006  . [place] DISEASE, LUMBAR 10/05/2007  . INSOMNIA 08/21/2007  . OBSTRUCTIVE SLEEP APNEA 12/03/2007  . COLONIC POLYPS, HX OF 07/26/2006    Past Surgical History  Procedure Laterality Date  . Appendectomy  1968  . Back surgery  1977, 06/14/2008    Dr. Trenton Gammon at [facility] (06/10)    History   Social History  . Marital Status: Married    Spouse Name: N/A    Number of Children: N/A  . Years of Education: N/A   Occupational History  . Disabled    Social History Main Topics  . Smoking status: Never Smoker   . Smokeless tobacco: Never Used  . Alcohol Use: No  . Drug Use: No  . Sexual Activity: Not on file   Other Topics Concern  . 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   Activity is severely limited by medical problems   Diet is "good".   Never a smoker   Alcohol: none          Current Outpatient Prescriptions on File Prior to Visit  Medication Sig Dispense Refill  . ALPRAZolam (XANAX) 1 MG tablet Take 1 tablet (1 mg total) by mouth at bedtime as needed for sleep.  30 tablet  2  . aspirin 81 MG chewable tablet Chew 81 mg by mouth daily.      . cyanocobalamin (,VITAMIN B-12,) 1000 MCG/ML injection 1 injection every 3 months       . EPINEPHrine (EPIPEN) 0.3 mg/0.3 mL DEVI Inject 0.3 mLs (0.3 mg total) into the muscle as needed.  2 Device  1  . furosemide (LASIX) 80 MG tablet TAKE ONE TAB BY MOUTH ONCE DAILY  30 tablet  7  . HUMALOG 100 UNIT/ML injection INJECT 3 TIMES DAILY (JUST BEFORE EACH MEAL) 170-150-230  100 mL  5  . HYDROcodone-acetaminophen  (NORCO) 10-325 MG per tablet Take 1 tablet by mouth every 4 (four) hours as needed for pain.  50 tablet  5  . insulin lispro (HUMALOG) 100 UNIT/ML injection Three times daily (just before each meal) 170-150-250      . losartan (COZAAR) 100 MG tablet Take 1 tablet (100 mg total) by mouth daily. Take 2 tablets by mouth at bedtime  60 tablet  5  . lovastatin (MEVACOR) 40 MG tablet TAKE 2 TABLETS BY MOUTH AT BEDTIME.  60 tablet  5  . PULMICORT FLEXHALER 180 MCG/ACT inhaler Inhale 2 puffs into the lungs 2 (two) times daily.      . VENTOLIN HFA 108 (90 BASE) MCG/ACT inhaler 2 puffs every 4-6 hours as needed for wheezing/shortness of breath      . famotidine (PEPCID) 20 MG tablet Take 1 tablet (20 mg total) by mouth 2 (two) times daily.  30 tablet  0   No current facility-administered medications on file prior to visit.    Allergies  Allergen Reactions  . Codeine     REACTION: chest pain  . Niacin-Lovastatin Er   . Pioglitazone     REACTION to Actos: swelling in ankles    Family History  Problem Relation Age of Onset  . Cancer Mother     Breast Cancer  . Cancer Sister     Breast Cancer    BP 122/86  Pulse 72  Temp(Src) 97.8 F (36.6 C) (Oral)  Ht 5' 6.5" (1.689 m)  Wt 286 lb (129.729 kg)  BMI 45.48 kg/m2  SpO2 97%  Review of Systems  Denies hearing loss, and visual loss Objective:   Vision:  Sees opthalmologist Hearing: grossly normal Body mass index:  See vs page Msk: pt easily and quickly performs "get-up-and-go" from a sitting position Cognitive Impairment Assessment: cognition, memory and judgment appear normal.  remembers 3/3 at 5 minutes.  excellent recall.  can easily read and write a sentence.  alert and oriented x 3.  Assessment:   Medicare wellness utd on preventive parameters    Plan:   During the course of the visit the patient was educated and counseled about appropriate screening and preventive services including:        Fall prevention   Diabetes  screening  Nutrition counseling   Vaccines / LABS Zostavax / Pneumococcal Vaccine today PSA  Patient Instructions (the written plan) was given to the patient.   we discussed code status.  pt requests full code, but would not want to be started or maintained on artificial life-support measures if there was not a reasonable chance of recovery.

## 2013-03-08 NOTE — Patient Instructions (Addendum)
Please reduce the bedtime insulin to 320 units.  However, take 10 extra units of humalog for any blood sugar in the 200's, and 20 extra for any over 300. please consider these measures for your health:  minimize alcohol.  do not use tobacco products.  have a colonoscopy at least every 10 years from age 66.   keep firearms safely stored.  always use seat belts.  have working smoke alarms in your home.  see an eye doctor and dentist regularly.  never drive under the influence of alcohol or drugs (including prescription drugs).  those with fair skin should take precautions against the sun.   it is critically important to prevent falling down (keep floor areas well-lit, dry, and free of loose objects.  If you have a cane, walker, or wheelchair, you should use it, even for short trips around the house.  Also, try not to rush) good diet and exercise habits significanly improve the control of your diabetes.  please let me know if you wish to be referred to a dietician.  high blood sugar is very risky to your health.  you should see an eye doctor every year.   Here is a prescription for cough syrup.   Please come back for a follow-up appointment in 3 months.

## 2013-03-14 ENCOUNTER — Encounter: Payer: Self-pay | Admitting: Internal Medicine

## 2013-03-14 DIAGNOSIS — M25559 Pain in unspecified hip: Secondary | ICD-10-CM | POA: Diagnosis not present

## 2013-03-14 DIAGNOSIS — M2569 Stiffness of other specified joint, not elsewhere classified: Secondary | ICD-10-CM | POA: Diagnosis not present

## 2013-03-14 DIAGNOSIS — M25659 Stiffness of unspecified hip, not elsewhere classified: Secondary | ICD-10-CM | POA: Diagnosis not present

## 2013-03-14 DIAGNOSIS — IMO0001 Reserved for inherently not codable concepts without codable children: Secondary | ICD-10-CM | POA: Diagnosis not present

## 2013-03-14 DIAGNOSIS — M238X9 Other internal derangements of unspecified knee: Secondary | ICD-10-CM | POA: Diagnosis not present

## 2013-03-17 ENCOUNTER — Encounter: Payer: Self-pay | Admitting: Pulmonary Disease

## 2013-03-17 ENCOUNTER — Ambulatory Visit (INDEPENDENT_AMBULATORY_CARE_PROVIDER_SITE_OTHER): Payer: Medicare Other | Admitting: Pulmonary Disease

## 2013-03-17 VITALS — BP 132/84 | HR 60 | Ht 67.0 in | Wt 299.0 lb

## 2013-03-17 DIAGNOSIS — I80299 Phlebitis and thrombophlebitis of other deep vessels of unspecified lower extremity: Secondary | ICD-10-CM

## 2013-03-17 DIAGNOSIS — J209 Acute bronchitis, unspecified: Secondary | ICD-10-CM

## 2013-03-17 DIAGNOSIS — G4730 Sleep apnea, unspecified: Secondary | ICD-10-CM | POA: Diagnosis not present

## 2013-03-17 MED ORDER — LEVOFLOXACIN 500 MG PO TABS: 500.0000 mg | ORAL_TABLET | Freq: Every day | ORAL | Status: DC

## 2013-03-17 MED ORDER — MOMETASONE FURO-FORMOTEROL FUM 100-5 MCG/ACT IN AERO: 2.0000 | INHALATION_SPRAY | Freq: Two times a day (BID) | RESPIRATORY_TRACT | Status: DC

## 2013-03-17 NOTE — Assessment & Plan Note (Signed)
Levaquin 500 daily x 10 days Take probiotic or yogurt while on this Dulera 100 twice daily - sample

## 2013-03-17 NOTE — Progress Notes (Signed)
   Subjective:    Patient ID: Donald [name], male    DOB: August 06, 1945, 68 y.o.   MRN: [id number]  HPI  25yowm never smoker for FU of obstructive sleep apnea & asthmatic bronchitis 1-2 episodes /year.  He developed DVT/pulmonary embolism 9/09 -seemingly idiopathic,subseq underwent back surgery 6/10 by Dr Trenton Gammon- Off coumadin since 10/10  He also had a chronic cough, which was attributed to GERD.  05/18/08 >> 50 mg trazodone helping with sleep, 300 mg made him 'wired'  6/00: Polysomnogram at [facility], RDI 138/h, lowest desatn 77%,maintained on CPAP+ 15 with a nasal mask. ([facility], [place]) since.  9/09 >> duplex >>echogenic material is seen in the left profunda femoral, femoral and popliteal veins.  02/18/08 >>residual thrombus in left prox to distal SFV  4/10 residual thrombus in Lt SFV persists  9/09 >> spiral CT >>Extensive bilateral pulmonary embolism is seen in both right and left pulmonary arteries as well as their lobar branches.  12/07/07 CT angio shows incomplete recanalisation of pulmonary arteries   March 26, 2010- - rpt cpap titration at [facility], on 15 cm  >>no changes   03/17/2013  01/22/2013 Acute OV for  dry cough, wheezing, hoarseness, head congestion, PND, clear mucus production x3 weeks. Went to ENT at onset and given Levaquin x2 rounds & Pred X1. Remains on Pulmicort twice daily. Has been using Ventolin more than usual.Prednisone caused his blood sugars to be very high.  Prolonged talking, and cold air makes his cough and wheezing, worse  >> Given dulera, hydromet, chlortrimeton  Got better then worse again x 1 wk - Dr Loanne Drilling gave phenergan/codeine Now c/o sinus drainage - yellow mucus Steroid injections in rt knee & left hip (bursitis) - sugars stayed ok - Hb A1c 7.3 C/o burning left thigh 'like when I had the blood clot'  Compliant with CPAP, mask ok, pressure ok, no dryness Off trazodone,on xanax   Past Medical History  Diagnosis Date   . DIABETES MELLITUS, TYPE II 07/26/2006  . DYSLIPIDEMIA 04/13/2009  . ANEMIA-IRON DEFICIENCY 07/26/2006  . Pernicious anemia 11/20/2006  . DEPRESSION 03/14/2009  . PERIPHERAL NEUROPATHY 07/26/2006  . HYPERTENSION 07/26/2006  . PULMONARY EMBOLISM 10/05/2007  . D V T 12/03/2007  . ASTHMA 07/26/2006  . GERD 07/26/2006  . [place] DISEASE, LUMBAR 10/05/2007  . INSOMNIA 08/21/2007  . OBSTRUCTIVE SLEEP APNEA 12/03/2007  . COLONIC POLYPS, HX OF 07/26/2006      Review of Systems neg for any significant sore throat, dysphagia, itching, sneezing, nasal congestion or excess/ purulent secretions, fever, chills, sweats, unintended wt loss, pleuritic or exertional cp, hempoptysis, orthopnea pnd or change in chronic leg swelling. Also denies presyncope, palpitations, heartburn, abdominal pain, nausea, vomiting, diarrhea or change in bowel or urinary habits, dysuria,hematuria, rash, arthralgias, visual complaints, headache, numbness weakness or ataxia.     Objective:   Physical Exam  Gen. Pleasant, obese, in no distress, normal affect ENT - no lesions, no post nasal drip, class 2-3 airway Neck: No JVD, no thyromegaly, no carotid bruits Lungs: no use of accessory muscles, no dullness to percussion, decreased without rales or rhonchi  Cardiovascular: Rhythm regular, heart sounds  normal, no murmurs or gallops, no peripheral edema Abdomen: soft and non-tender, no hepatosplenomegaly, BS normal. Musculoskeletal: No deformities, no cyanosis or clubbing Neuro:  alert, non focal, no tremors       Assessment & Plan:

## 2013-03-17 NOTE — Assessment & Plan Note (Signed)
Venous duplex LE to check for blood clots Again c/o swelling & burning

## 2013-03-17 NOTE — Patient Instructions (Signed)
Levaquin 500 daily x 10 days Take probiotic or yogurt while on this Dulera 100 twice daily - sample Venous duplex LE to check for blood clots

## 2013-03-18 NOTE — Assessment & Plan Note (Signed)
Weight loss encouraged, compliance with goal of at least 4-6 hrs every night is the expectation. Advised against medications with sedative side effects Cautioned against driving when sleepy - understanding that sleepiness will vary on a day to day basis  

## 2013-03-24 ENCOUNTER — Encounter (HOSPITAL_COMMUNITY): Payer: Medicare Other

## 2013-03-25 ENCOUNTER — Ambulatory Visit (HOSPITAL_COMMUNITY): Payer: Medicare Other | Attending: Cardiovascular Disease | Admitting: *Deleted

## 2013-03-25 ENCOUNTER — Encounter: Payer: Self-pay | Admitting: Cardiovascular Disease

## 2013-03-25 DIAGNOSIS — I82409 Acute embolism and thrombosis of unspecified deep veins of unspecified lower extremity: Secondary | ICD-10-CM | POA: Insufficient documentation

## 2013-03-25 DIAGNOSIS — M79609 Pain in unspecified limb: Secondary | ICD-10-CM

## 2013-03-25 DIAGNOSIS — I80299 Phlebitis and thrombophlebitis of other deep vessels of unspecified lower extremity: Secondary | ICD-10-CM

## 2013-03-25 DIAGNOSIS — R609 Edema, unspecified: Secondary | ICD-10-CM

## 2013-03-25 DIAGNOSIS — M7989 Other specified soft tissue disorders: Secondary | ICD-10-CM | POA: Insufficient documentation

## 2013-03-25 NOTE — Progress Notes (Signed)
Lower extremity duplex complete

## 2013-03-29 DIAGNOSIS — M545 Low back pain, unspecified: Secondary | ICD-10-CM | POA: Diagnosis not present

## 2013-03-29 DIAGNOSIS — IMO0002 Reserved for concepts with insufficient information to code with codable children: Secondary | ICD-10-CM | POA: Diagnosis not present

## 2013-03-29 DIAGNOSIS — M2390 Unspecified internal derangement of unspecified knee: Secondary | ICD-10-CM | POA: Diagnosis not present

## 2013-03-29 DIAGNOSIS — M25569 Pain in unspecified knee: Secondary | ICD-10-CM | POA: Diagnosis not present

## 2013-04-06 DIAGNOSIS — IMO0002 Reserved for concepts with insufficient information to code with codable children: Secondary | ICD-10-CM | POA: Diagnosis not present

## 2013-04-08 DIAGNOSIS — S92309A Fracture of unspecified metatarsal bone(s), unspecified foot, initial encounter for closed fracture: Secondary | ICD-10-CM | POA: Diagnosis not present

## 2013-04-08 DIAGNOSIS — M76829 Posterior tibial tendinitis, unspecified leg: Secondary | ICD-10-CM | POA: Diagnosis not present

## 2013-04-12 ENCOUNTER — Other Ambulatory Visit: Payer: Self-pay | Admitting: Endocrinology

## 2013-04-13 NOTE — Telephone Encounter (Signed)
Rx faxed to pharmacy  

## 2013-04-14 ENCOUNTER — Encounter: Payer: Self-pay | Admitting: Internal Medicine

## 2013-04-30 DIAGNOSIS — I10 Essential (primary) hypertension: Secondary | ICD-10-CM | POA: Diagnosis not present

## 2013-04-30 DIAGNOSIS — M5126 Other intervertebral disc displacement, lumbar region: Secondary | ICD-10-CM | POA: Diagnosis not present

## 2013-04-30 DIAGNOSIS — Z6841 Body Mass Index (BMI) 40.0 and over, adult: Secondary | ICD-10-CM | POA: Diagnosis not present

## 2013-05-26 DIAGNOSIS — M545 Low back pain, unspecified: Secondary | ICD-10-CM | POA: Diagnosis not present

## 2013-06-04 ENCOUNTER — Ambulatory Visit: Payer: Medicare Other | Admitting: Endocrinology

## 2013-06-09 DIAGNOSIS — IMO0002 Reserved for concepts with insufficient information to code with codable children: Secondary | ICD-10-CM | POA: Diagnosis not present

## 2013-06-09 DIAGNOSIS — M5137 Other intervertebral disc degeneration, lumbosacral region: Secondary | ICD-10-CM | POA: Diagnosis not present

## 2013-06-09 DIAGNOSIS — M545 Low back pain, unspecified: Secondary | ICD-10-CM | POA: Diagnosis not present

## 2013-06-10 ENCOUNTER — Other Ambulatory Visit: Payer: Self-pay | Admitting: Endocrinology

## 2013-06-18 ENCOUNTER — Encounter: Payer: Self-pay | Admitting: Endocrinology

## 2013-06-18 ENCOUNTER — Ambulatory Visit (INDEPENDENT_AMBULATORY_CARE_PROVIDER_SITE_OTHER): Payer: Medicare Other | Admitting: Endocrinology

## 2013-06-18 VITALS — BP 122/66 | HR 57 | Temp 97.7°F | Ht 67.0 in | Wt 289.0 lb

## 2013-06-18 DIAGNOSIS — E1065 Type 1 diabetes mellitus with hyperglycemia: Principal | ICD-10-CM

## 2013-06-18 DIAGNOSIS — E1049 Type 1 diabetes mellitus with other diabetic neurological complication: Secondary | ICD-10-CM

## 2013-06-18 LAB — HEMOGLOBIN A1C: HEMOGLOBIN A1C: 7.1 % — AB (ref 4.6–6.5)

## 2013-06-18 MED ORDER — FLUTICASONE PROPIONATE 50 MCG/ACT NA SUSP: 2.0000 | Freq: Every day | NASAL | Status: DC

## 2013-06-18 NOTE — Patient Instructions (Addendum)
A diabetes blood test is being requested for you today.  We'll contact you with results.   check your blood sugar twice a day.  vary the time of day when you check, between before the 3 meals, and at bedtime.  also check if you have symptoms of your blood sugar being too high or too low.  please keep a record of the readings and bring it to your next appointment here.  please call us sooner if your blood sugar goes below 70, or if you have a lot of readings over 200.   Loratadine-d (non-prescription) will help your congestion. Also, i have sent a prescription to your pharmacy, for flonase nasal spray.

## 2013-06-18 NOTE — Progress Notes (Signed)
Subjective:    Patient ID: Donald [name], male    DOB: 12/02/1945, 68 y.o.   MRN: [place]:[id number]  HPI Pt returns for f/u of IDDM (dx'ed 1989; he has been on insulin since 1995; it ischaracterized by severe insulin resistance; he has moderate neuropathy of the lower extremities; he has associated CAD and nephropathy; he has never had pancreatitis, severe hypoglycemia, or DKA; he takes multiple daily injections; he declines weight-loss surgery).   no cbg record, but states cbg's vary from 51-160.  It is in general higher as the day goes on.  pt states he feels well in general, except for a recurrence of his low-back pain.   Nasal congestion and rhinorrhea have recurred.   Past Medical History  Diagnosis Date  . DIABETES MELLITUS, TYPE II 07/26/2006  . DYSLIPIDEMIA 04/13/2009  . ANEMIA-IRON DEFICIENCY 07/26/2006  . Pernicious anemia 11/20/2006  . DEPRESSION 03/14/2009  . PERIPHERAL NEUROPATHY 07/26/2006  . HYPERTENSION 07/26/2006  . PULMONARY EMBOLISM 10/05/2007  . D V T 12/03/2007  . ASTHMA 07/26/2006  . GERD 07/26/2006  . [place] DISEASE, LUMBAR 10/05/2007  . INSOMNIA 08/21/2007  . OBSTRUCTIVE SLEEP APNEA 12/03/2007  . COLONIC POLYPS, HX OF 07/26/2006    Past Surgical History  Procedure Laterality Date  . Appendectomy  1968  . Back surgery  1977, 06/14/2008    Dr. Trenton Gammon at [facility] (06/10)    History   Social History  . Marital Status: Married    Spouse Name: N/A    Number of Children: N/A  . Years of Education: N/A   Occupational History  . Disabled    Social History Main Topics  . Smoking status: Never Smoker   . Smokeless tobacco: Never Used  . Alcohol Use: No  . Drug Use: No  . Sexual Activity: Not on file   Other Topics Concern  . 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   Activity is severely limited by medical problems   Diet is "good".   Never a smoker   Alcohol: none           Current Outpatient Prescriptions on File Prior to Visit  Medication Sig Dispense Refill  . ALPRAZolam (XANAX) 1 MG tablet TAKE ONE TABLET BY MOUTH AT BEDTIME AS NEEDED FOR SLEEP  30 tablet  5  . aspirin 81 MG chewable tablet Chew 81 mg by mouth daily.      . cyanocobalamin (,VITAMIN B-12,) 1000 MCG/ML injection 1 injection every 3 months       . EPINEPHrine (EPIPEN) 0.3 mg/0.3 mL DEVI Inject 0.3 mLs (0.3 mg total) into the muscle as needed.  2 Device  1  . famotidine (PEPCID) 20 MG tablet Take 1 tablet (20 mg total) by mouth 2 (two) times daily.  30 tablet  0  . furosemide (LASIX) 80 MG tablet TAKE ONE TAB BY MOUTH ONCE DAILY  30 tablet  7  . HYDROcodone-acetaminophen (NORCO) 10-325 MG per tablet Take 1 tablet by mouth every 4 (four) hours as needed for pain.  50 tablet  5  . insulin lispro (HUMALOG) 100 UNIT/ML injection Three times daily (just before each meal) 180-160-260      . insulin NPH Human (HUMULIN N,NOVOLIN N) 100 UNIT/ML injection 275 Units at bedtime.       Marland Kitchen losartan (COZAAR) 100 MG tablet Take 1 tablet (100 mg total) by mouth daily. Take 2 tablets  by mouth at bedtime  60 tablet  5  . lovastatin (MEVACOR) 40 MG tablet TAKE TWO TABLETS BY MOUTH AT BEDTIME  60 tablet  1  . mometasone-formoterol (DULERA) 100-5 MCG/ACT AERO Inhale 2 puffs into the lungs 2 (two) times daily.  1 Inhaler  0  . PULMICORT FLEXHALER 180 MCG/ACT inhaler Inhale 2 puffs into the lungs 2 (two) times daily.      . sildenafil (REVATIO) 20 MG tablet 1-5 pills, as needed  90 tablet  11  . VENTOLIN HFA 108 (90 BASE) MCG/ACT inhaler 2 puffs every 4-6 hours as needed for wheezing/shortness of breath       No current facility-administered medications on file prior to visit.    Allergies  Allergen Reactions  . Codeine     REACTION: chest pain  . Niacin-Lovastatin Er   . Pioglitazone     REACTION to Actos: swelling in ankles    Family History  Problem Relation Age of Onset  . Cancer Mother     Breast  Cancer  . Cancer Sister     Breast Cancer    BP 122/66  Pulse 57  Temp(Src) 97.7 F (36.5 C) (Oral)  Ht 5\' 7"  (1.702 m)  Wt 289 lb (131.09 kg)  BMI 45.25 kg/m2  SpO2 97%   Review of Systems he denies LOC and weight change.      Objective:   Physical Exam Pulses: dorsalis pedis intact bilat.  Feet: no deformity. feet are of normal color and temp. 1+ bilat leg edema.  Skin: no ulcer on the feet.  Neuro: sensation is intact to touch on the feet, but decreased from normal.    Lab Results  Component Value Date   HGBA1C 7.1* 06/18/2013       Assessment & Plan:  DM: .mild exacerbation Side-effect of treatment: hypoglycemia.  This limits glycemic control. allergic rhinitis, recurrent.     Patient is advised the following: Patient Instructions  A diabetes blood test is being requested for you today.  We'll contact you with results.   check your blood sugar twice a day.  vary the time of day when you check, between before the 3 meals, and at bedtime.  also check if you have symptoms of your blood sugar being too high or too low.  please keep a record of the readings and bring it to your next appointment here.  please call us sooner if your blood sugar goes below 70, or if you have a lot of readings over 200.   Loratadine-d (non-prescription) will help your congestion. Also, i have sent a prescription to your pharmacy, for flonase nasal spray.

## 2013-06-19 LAB — BASIC METABOLIC PANEL WITH GFR
BUN: 23 mg/dL (ref 6–23)
CHLORIDE: 106 meq/L (ref 96–112)
CO2: 20 mEq/L (ref 19–32)
Calcium: 8.6 mg/dL (ref 8.4–10.5)
Creat: 1.53 mg/dL — ABNORMAL HIGH (ref 0.50–1.35)
GFR, Est African American: 54 mL/min — ABNORMAL LOW
GFR, Est Non African American: 47 mL/min — ABNORMAL LOW
Glucose, Bld: 132 mg/dL — ABNORMAL HIGH (ref 70–99)
POTASSIUM: 4.4 meq/L (ref 3.5–5.3)
Sodium: 143 mEq/L (ref 135–145)

## 2013-07-26 ENCOUNTER — Other Ambulatory Visit: Payer: Self-pay | Admitting: Endocrinology

## 2013-08-05 DIAGNOSIS — K219 Gastro-esophageal reflux disease without esophagitis: Secondary | ICD-10-CM | POA: Diagnosis not present

## 2013-08-05 DIAGNOSIS — J342 Deviated nasal septum: Secondary | ICD-10-CM | POA: Diagnosis not present

## 2013-08-05 DIAGNOSIS — J018 Other acute sinusitis: Secondary | ICD-10-CM | POA: Diagnosis not present

## 2013-08-06 ENCOUNTER — Other Ambulatory Visit: Payer: Self-pay | Admitting: Endocrinology

## 2013-08-20 ENCOUNTER — Other Ambulatory Visit: Payer: Self-pay | Admitting: Endocrinology

## 2013-09-07 ENCOUNTER — Other Ambulatory Visit: Payer: Self-pay | Admitting: Endocrinology

## 2013-09-17 ENCOUNTER — Encounter: Payer: Self-pay | Admitting: Endocrinology

## 2013-09-17 ENCOUNTER — Encounter: Payer: Self-pay | Admitting: Adult Health

## 2013-09-17 ENCOUNTER — Ambulatory Visit (INDEPENDENT_AMBULATORY_CARE_PROVIDER_SITE_OTHER): Payer: Medicare Other | Admitting: Adult Health

## 2013-09-17 ENCOUNTER — Ambulatory Visit (INDEPENDENT_AMBULATORY_CARE_PROVIDER_SITE_OTHER): Payer: Medicare Other | Admitting: Endocrinology

## 2013-09-17 ENCOUNTER — Ambulatory Visit: Payer: Medicare Other | Admitting: Adult Health

## 2013-09-17 VITALS — BP 136/54 | HR 56 | Temp 98.3°F | Ht 67.0 in | Wt 286.0 lb

## 2013-09-17 VITALS — BP 110/62 | HR 49 | Temp 98.0°F | Ht 67.0 in | Wt 288.2 lb

## 2013-09-17 DIAGNOSIS — G4730 Sleep apnea, unspecified: Secondary | ICD-10-CM | POA: Diagnosis not present

## 2013-09-17 DIAGNOSIS — Z23 Encounter for immunization: Secondary | ICD-10-CM | POA: Diagnosis not present

## 2013-09-17 DIAGNOSIS — E1065 Type 1 diabetes mellitus with hyperglycemia: Principal | ICD-10-CM

## 2013-09-17 DIAGNOSIS — J309 Allergic rhinitis, unspecified: Secondary | ICD-10-CM

## 2013-09-17 DIAGNOSIS — D510 Vitamin B12 deficiency anemia due to intrinsic factor deficiency: Secondary | ICD-10-CM

## 2013-09-17 DIAGNOSIS — E1049 Type 1 diabetes mellitus with other diabetic neurological complication: Secondary | ICD-10-CM

## 2013-09-17 DIAGNOSIS — J45909 Unspecified asthma, uncomplicated: Secondary | ICD-10-CM

## 2013-09-17 LAB — HEMOGLOBIN A1C: Hgb A1c MFr Bld: 7.7 % — ABNORMAL HIGH (ref 4.6–6.5)

## 2013-09-17 MED ORDER — CYANOCOBALAMIN 1000 MCG/ML IJ SOLN
1000.0000 ug | Freq: Once | INTRAMUSCULAR | Status: AC
  Administered 2013-09-17: 1000 ug via INTRAMUSCULAR

## 2013-09-17 NOTE — Progress Notes (Signed)
Subjective:    Patient ID: Donald [name], male    DOB: April 01, 1945, 68 y.o.   MRN: [id number]  HPI   87yowm never smoker for FU of obstructive sleep apnea & asthmatic bronchitis 1-2 episodes /year.  He developed DVT/pulmonary embolism 9/09 -seemingly idiopathic,subseq underwent back surgery 6/10 by Dr Trenton Gammon- Off coumadin since 10/10  He also had a chronic cough, which was attributed to GERD.  05/18/08 >> 50 mg trazodone helping with sleep, 300 mg made him 'wired'  6/00: Polysomnogram at [facility], RDI 138/h, lowest desatn 77%,maintained on CPAP+ 15 with a nasal mask. ([facility], [place]) since.  9/09 >> duplex >>echogenic material is seen in the left profunda femoral, femoral and popliteal veins.  02/18/08 >>residual thrombus in left prox to distal SFV  4/10 residual thrombus in Lt SFV persists  9/09 >> spiral CT >>Extensive bilateral pulmonary embolism is seen in both right and left pulmonary arteries as well as their lobar branches.  12/07/07 CT angio shows incomplete recanalisation of pulmonary arteries   March 26, 2010- - rpt cpap titration at [facility], on 15 cm  >>no changes   03/17/13  01/22/2013 Acute OV for  dry cough, wheezing, hoarseness, head congestion, PND, clear mucus production x3 weeks. Went to ENT at onset and given Levaquin x2 rounds & Pred X1. Remains on Pulmicort twice daily. Has been using Ventolin more than usual.Prednisone caused his blood sugars to be very high.  Prolonged talking, and cold air makes his cough and wheezing, worse  >> Given dulera, hydromet, chlortrimeton  Got better then worse again x 1 wk - Dr Loanne Drilling gave phenergan/codeine Now c/o sinus drainage - yellow mucus Steroid injections in rt knee & left hip (bursitis) - sugars stayed ok - Hb A1c 7.3 C/o burning left thigh 'like when I had the blood clot'  Compliant with CPAP, mask ok, pressure ok, no dryness Off trazodone,on xanax   09/17/2013 Follow up OSA  Returns for 6  month follow up .  Last ov trial of Dulera for RAD /asthma says took sample but does not feel he needs this. No flare in cough or wheezing. Uses Albuterol rescue inhaler rarely.  Has hx of sinusitis  Seen by ENT 6 weeks ago, sinusitis flare . Tx w/ dymista . Some better but still has symptoms.  Complains of nasal drip/drainage. Worse in am after taking off CPAP mask for about 2 hours.  Has OSA Wearing CPAP all night and with naps.  avg 6-8 hr each night.  Had flu shot today.  No chest pain,orthopnea, edema or nv/d.    Past Medical History  Diagnosis Date  . DIABETES MELLITUS, TYPE II 07/26/2006  . DYSLIPIDEMIA 04/13/2009  . ANEMIA-IRON DEFICIENCY 07/26/2006  . Pernicious anemia 11/20/2006  . DEPRESSION 03/14/2009  . PERIPHERAL NEUROPATHY 07/26/2006  . HYPERTENSION 07/26/2006  . PULMONARY EMBOLISM 10/05/2007  . D V T 12/03/2007  . ASTHMA 07/26/2006  . GERD 07/26/2006  . [place] DISEASE, LUMBAR 10/05/2007  . INSOMNIA 08/21/2007  . OBSTRUCTIVE SLEEP APNEA 12/03/2007  . COLONIC POLYPS, HX OF 07/26/2006      Review of Systems  neg for any significant sore throat, dysphagia, itching, sneezing,   fever, chills, sweats, unintended wt loss, pleuritic or exertional cp, hempoptysis, orthopnea pnd or change in chronic leg swelling. Also denies presyncope, palpitations, heartburn, abdominal pain, nausea, vomiting, diarrhea or change in bowel or urinary habits, dysuria,hematuria, rash, arthralgias, visual complaints, headache, numbness weakness or ataxia.     Objective:  Physical Exam   Gen. Pleasant, obese, in no distress, normal affect ENT - no lesions, no post nasal drip, class 2-3 airway Neck: No JVD, no thyromegaly, no carotid bruits Lungs: no use of accessory muscles, no dullness to percussion, decreased without rales or rhonchi  Cardiovascular: Rhythm regular, heart sounds  normal, no murmurs or gallops, no peripheral edema Abdomen: soft and non-tender, no hepatosplenomegaly, BS  normal. Musculoskeletal: No deformities, no cyanosis or clubbing Neuro:  alert, non focal, no tremors       Assessment & Plan:

## 2013-09-17 NOTE — Progress Notes (Signed)
Subjective:    Patient ID: Donald [name], male    DOB: 02/28/1945, 68 y.o.   MRN: [id number]  HPI Pt returns for f/u of IDDM (dx'ed 1989; he has been on insulin since 1995; it is characterized by severe insulin resistance; complicated by sensory neuropathy of the lower extremities, CAD and nephropathy; he has never had pancreatitis, severe hypoglycemia, or DKA; he takes multiple daily injections; he declines weight-loss surgery).  he brings a record of his cbg's which i have reviewed today.  He has had only 1 episode of hypoglycemia, and this was mild (66 in am).  Otherwise, it varies from 90-140, but most are approx 110.  There is no trend throughout the day.  Past Medical History  Diagnosis Date  . DIABETES MELLITUS, TYPE II 07/26/2006  . DYSLIPIDEMIA 04/13/2009  . ANEMIA-IRON DEFICIENCY 07/26/2006  . Pernicious anemia 11/20/2006  . DEPRESSION 03/14/2009  . PERIPHERAL NEUROPATHY 07/26/2006  . HYPERTENSION 07/26/2006  . PULMONARY EMBOLISM 10/05/2007  . D V T 12/03/2007  . ASTHMA 07/26/2006  . GERD 07/26/2006  . [place] DISEASE, LUMBAR 10/05/2007  . INSOMNIA 08/21/2007  . OBSTRUCTIVE SLEEP APNEA 12/03/2007  . COLONIC POLYPS, HX OF 07/26/2006    Past Surgical History  Procedure Laterality Date  . Appendectomy  1968  . Back surgery  1977, 06/14/2008    Dr. Trenton Gammon at [facility] (06/10)    History   Social History  . Marital Status: Married    Spouse Name: N/A    Number of Children: N/A  . Years of Education: N/A   Occupational History  . Disabled    Social History Main Topics  . Smoking status: Never Smoker   . Smokeless tobacco: Never Used  . Alcohol Use: No  . Drug Use: No  . Sexual Activity: Not on file   Other Topics Concern  . 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   Activity is severely limited by medical problems   Diet is "good".   Never a smoker   Alcohol: none          Current  Outpatient Prescriptions on File Prior to Visit  Medication Sig Dispense Refill  . ALPRAZolam (XANAX) 1 MG tablet TAKE ONE TABLET BY MOUTH AT BEDTIME AS NEEDED FOR SLEEP  30 tablet  5  . aspirin 81 MG chewable tablet Chew 81 mg by mouth daily.      . cyanocobalamin (,VITAMIN B-12,) 1000 MCG/ML injection 1 injection every 3 months       . EPINEPHrine (EPIPEN) 0.3 mg/0.3 mL DEVI Inject 0.3 mLs (0.3 mg total) into the muscle as needed.  2 Device  1  . furosemide (LASIX) 80 MG tablet TAKE ONE TABLET EVERY DAY  30 tablet  2  . HYDROcodone-acetaminophen (NORCO) 10-325 MG per tablet Take 1 tablet by mouth every 4 (four) hours as needed for pain.  50 tablet  5  . insulin NPH Human (HUMULIN N,NOVOLIN N) 100 UNIT/ML injection 260 Units at bedtime.       . lovastatin (MEVACOR) 40 MG tablet TAKE TWO TABLETS BY MOUTH AT BEDTIME  60 tablet  2  . PULMICORT FLEXHALER 180 MCG/ACT inhaler Inhale 2 puffs into the lungs 2 (two) times daily.      . sildenafil (REVATIO) 20 MG tablet 1-5 pills, as needed  90 tablet  11  . VENTOLIN HFA 108 (90 BASE) MCG/ACT inhaler 2  puffs every 4-6 hours as needed for wheezing/shortness of breath       No current facility-administered medications on file prior to visit.    Allergies  Allergen Reactions  . Codeine     REACTION: chest pain  . Niacin-Lovastatin Er   . Pioglitazone     REACTION to Actos: swelling in ankles    Family History  Problem Relation Age of Onset  . Cancer Mother     Breast Cancer  . Cancer Sister     Breast Cancer    BP 136/54  Pulse 56  Temp(Src) 98.3 F (36.8 C) (Oral)  Ht 5\' 7"  (1.702 m)  Wt 286 lb (129.729 kg)  BMI 44.78 kg/m2  SpO2 97%   Review of Systems Denies LOC and weight change    Objective:   Physical Exam VITAL SIGNS:  See vs page GENERAL: no distress Pulses: dorsalis pedis intact bilat.  Feet: no deformity. feet are of normal color and temp. 1+ bilat leg edema.  Skin: no ulcer on the feet.  Neuro: sensation is intact  to touch on the feet, but decreased from normal.    Lab Results  Component Value Date   HGBA1C 7.7* 09/17/2013       Assessment & Plan:  DM: mild exacerbation Morbid obesity, stable: surgery is needed, but he declines.   Patient is advised the following: Patient Instructions  A diabetes blood test is being requested for you today.  We'll contact you with results.   check your blood sugar twice a day.  vary the time of day when you check, between before the 3 meals, and at bedtime.  also check if you have symptoms of your blood sugar being too high or too low.  please keep a record of the readings and bring it to your next appointment here.  please call us sooner if your blood sugar goes below 70, or if you have a lot of readings over 200.   Please come back for a follow-up appointment in 3 months.    (addendum: pt is advised to increase insulin)

## 2013-09-17 NOTE — Patient Instructions (Addendum)
A diabetes blood test is being requested for you today.  We'll contact you with results.   check your blood sugar twice a day.  vary the time of day when you check, between before the 3 meals, and at bedtime.  also check if you have symptoms of your blood sugar being too high or too low.  please keep a record of the readings and bring it to your next appointment here.  please call us sooner if your blood sugar goes below 70, or if you have a lot of readings over 200.   Please come back for a follow-up appointment in 3 months.

## 2013-09-17 NOTE — Assessment & Plan Note (Signed)
Mild intermittent asthma vs RAD -no flare off maintenance therapy  Use As needed  Albuterol  Cont to montior

## 2013-09-17 NOTE — Assessment & Plan Note (Signed)
Good reported compliance and tolerance  Advised no driving when sleepy  Plan  Continue on CPAP At bedtime and with naps.  Continue to work on weight loss Follow up Dr. Elsworth Soho  In 6 months and As needed

## 2013-09-17 NOTE — Patient Instructions (Signed)
Continue on CPAP At bedtime and with naps.  Continue to work on weight loss May try Allegra 180mg  daily As needed for drainage.  Saline nasal rinses As needed  Twice daily   Saline nasal gel As needed   Follow up Dr. Elsworth Soho  In 6 months and As needed

## 2013-09-17 NOTE — Assessment & Plan Note (Signed)
Flare +/- CPAP mask nasal irritation  Plan  May try Allegra 180mg  daily As needed for drainage.  Saline nasal rinses As needed  Twice daily   Saline nasal gel As needed   Follow up Dr. Elsworth Soho  In 6 months and As needed

## 2013-09-23 NOTE — Progress Notes (Signed)
Reviewed & agree with plan  

## 2013-10-07 ENCOUNTER — Telehealth: Payer: Self-pay | Admitting: Endocrinology

## 2013-10-07 NOTE — Telephone Encounter (Signed)
Contacted pt. He states he is having the burning and stabbing pain in both of his feet. Pt states this pain has been going on for a couple of weeks.  Please advise, Thanks!

## 2013-10-07 NOTE — Telephone Encounter (Signed)
Please advise ov tomorrow, in an open appt.

## 2013-10-07 NOTE — Telephone Encounter (Signed)
Patient stated that he is having stabbing and burning pain in his foot, he want to know if need be seen or referred to another doctor.  Please advise

## 2013-10-07 NOTE — Telephone Encounter (Signed)
Pt coming for appointment on 10/07/2013 at 930 am.

## 2013-10-08 ENCOUNTER — Ambulatory Visit (INDEPENDENT_AMBULATORY_CARE_PROVIDER_SITE_OTHER): Payer: Medicare Other | Admitting: Endocrinology

## 2013-10-08 ENCOUNTER — Ambulatory Visit
Admission: RE | Admit: 2013-10-08 | Discharge: 2013-10-08 | Disposition: A | Discharge status: Home / Self Care | Payer: Medicare Other | Source: Ambulatory Visit | Attending: Endocrinology | Admitting: Endocrinology

## 2013-10-08 ENCOUNTER — Encounter: Payer: Self-pay | Admitting: Endocrinology

## 2013-10-08 VITALS — BP 114/52 | HR 55 | Temp 98.5°F | Ht 67.0 in | Wt 287.0 lb

## 2013-10-08 DIAGNOSIS — M79609 Pain in unspecified limb: Secondary | ICD-10-CM | POA: Diagnosis not present

## 2013-10-08 DIAGNOSIS — M25579 Pain in unspecified ankle and joints of unspecified foot: Secondary | ICD-10-CM

## 2013-10-08 DIAGNOSIS — M7730 Calcaneal spur, unspecified foot: Secondary | ICD-10-CM | POA: Diagnosis not present

## 2013-10-08 DIAGNOSIS — M25571 Pain in right ankle and joints of right foot: Secondary | ICD-10-CM

## 2013-10-08 MED ORDER — GABAPENTIN 300 MG PO CAPS: 300.0000 mg | ORAL_CAPSULE | Freq: Every day | ORAL | Status: DC

## 2013-10-08 NOTE — Patient Instructions (Addendum)
An x-ray is requested for you today.  We'll contact you with results. i have sent a prescription to your pharmacy, for the pain.  This will help your back, also.   Call if this does not help, as we can increase it. Also please cal if you want to see [facility].

## 2013-10-08 NOTE — Progress Notes (Signed)
Subjective:    Patient ID: Donald [name], male    DOB: Aug 04, 1945, 68 y.o.   MRN: [place]:[id number]  HPI Pt states 1 week of moderate pain at the lateral aspect of the right foot, but no assoc numbness.  No local injury. Past Medical History  Diagnosis Date  . DIABETES MELLITUS, TYPE II 07/26/2006  . DYSLIPIDEMIA 04/13/2009  . ANEMIA-IRON DEFICIENCY 07/26/2006  . Pernicious anemia 11/20/2006  . DEPRESSION 03/14/2009  . PERIPHERAL NEUROPATHY 07/26/2006  . HYPERTENSION 07/26/2006  . PULMONARY EMBOLISM 10/05/2007  . D V T 12/03/2007  . ASTHMA 07/26/2006  . GERD 07/26/2006  . [place] DISEASE, LUMBAR 10/05/2007  . INSOMNIA 08/21/2007  . OBSTRUCTIVE SLEEP APNEA 12/03/2007  . COLONIC POLYPS, HX OF 07/26/2006    Past Surgical History  Procedure Laterality Date  . Appendectomy  1968  . Back surgery  1977, 06/14/2008    Dr. Trenton Gammon at [facility] (06/10)    History   Social History  . Marital Status: Married    Spouse Name: N/A    Number of Children: N/A  . Years of Education: N/A   Occupational History  . Disabled    Social History Main Topics  . Smoking status: Never Smoker   . Smokeless tobacco: Never Used  . Alcohol Use: No  . Drug Use: No  . Sexual Activity: Not on file   Other Topics Concern  . 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   Activity is severely limited by medical problems   Diet is "good".   Never a smoker   Alcohol: none          Current Outpatient Prescriptions on File Prior to Visit  Medication Sig Dispense Refill  . ALPRAZolam (XANAX) 1 MG tablet TAKE ONE TABLET BY MOUTH AT BEDTIME AS NEEDED FOR SLEEP  30 tablet  5  . aspirin 81 MG chewable tablet Chew 81 mg by mouth daily.      . cyanocobalamin (,VITAMIN B-12,) 1000 MCG/ML injection 1 injection every 3 months       . EPINEPHrine (EPIPEN) 0.3 mg/0.3 mL DEVI Inject 0.3 mLs (0.3 mg total) into the muscle as needed.  2 Device  1    . fluticasone (FLONASE) 50 MCG/ACT nasal spray Place 2 sprays into both nostrils as needed.      . furosemide (LASIX) 80 MG tablet TAKE ONE TABLET EVERY DAY  30 tablet  2  . HYDROcodone-acetaminophen (NORCO) 10-325 MG per tablet Take 1 tablet by mouth every 4 (four) hours as needed for pain.  50 tablet  5  . insulin lispro (HUMALOG) 100 UNIT/ML injection Inject into the skin 3 (three) times daily before meals. 3 times a day (just before each meal) 180-150-240 units      . insulin NPH Human (HUMULIN N,NOVOLIN N) 100 UNIT/ML injection 260 Units at bedtime.       Marland Kitchen losartan (COZAAR) 100 MG tablet Take 100 mg by mouth daily.      Marland Kitchen lovastatin (MEVACOR) 40 MG tablet TAKE TWO TABLETS BY MOUTH AT BEDTIME  60 tablet  2  . omeprazole (PRILOSEC) 20 MG capsule Take 20 mg by mouth daily.      Marland Kitchen PULMICORT FLEXHALER 180 MCG/ACT inhaler Inhale 2 puffs into the lungs 2 (two) times daily.      . sildenafil (REVATIO) 20 MG tablet 1-5 pills, as needed  90 tablet  11  .  VENTOLIN HFA 108 (90 BASE) MCG/ACT inhaler 2 puffs every 4-6 hours as needed for wheezing/shortness of breath       No current facility-administered medications on file prior to visit.    Allergies  Allergen Reactions  . Codeine     REACTION: chest pain  . Niacin-Lovastatin Er   . Pioglitazone     REACTION to Actos: swelling in ankles    Family History  Problem Relation Age of Onset  . Cancer Mother     Breast Cancer  . Cancer Sister     Breast Cancer    BP 114/52  Pulse 55  Temp(Src) 98.5 F (36.9 C) (Oral)  Ht 5\' 7"  (1.702 m)  Wt 287 lb (130.182 kg)  BMI 44.94 kg/m2  SpO2 96%   Review of Systems Denies fever or foot ulcer    Objective:   Physical Exam VITAL SIGNS:  See vs page GENERAL: no distress Pulses: dorsalis pedis intact bilat.  Feet: no deformity. feet are of normal color and temp. 1+ bilat leg edema. Slight tenderness at the lateral aspect of the right foot Skin: no ulcer on the feet.  Neuro: sensation is  intact to touch on the feet, but decreased from normal.   Radiol: no fracture    Assessment & Plan:  Foot pain, new, uncertain etiology Peripheral neuropathy, unchanged.   Patient is advised the following: Patient Instructions  An x-ray is requested for you today.  We'll contact you with results. i have sent a prescription to your pharmacy, for the pain.  This will help your back, also.   Call if this does not help, as we can increase it. Also please cal if you want to see [facility].

## 2013-10-18 ENCOUNTER — Telehealth: Payer: Self-pay | Admitting: Endocrinology

## 2013-10-18 MED ORDER — GABAPENTIN 300 MG PO CAPS: 300.0000 mg | ORAL_CAPSULE | Freq: Two times a day (BID) | ORAL | Status: DC

## 2013-10-18 NOTE — Telephone Encounter (Signed)
Pt calling regarding gabapentin for his foot pain and burning it worked for the first few days but over the weekend it began to not help at all. Please advise

## 2013-10-18 NOTE — Telephone Encounter (Signed)
Please increase the gabapentin to twice a day. i have sent a prescription to your pharmacy. Please call if we need to increase it again.

## 2013-10-18 NOTE — Telephone Encounter (Signed)
See below and please advise, Thanks!  

## 2013-10-18 NOTE — Telephone Encounter (Signed)
Pt advised.

## 2013-10-25 ENCOUNTER — Telehealth: Payer: Self-pay | Admitting: Endocrinology

## 2013-10-25 NOTE — Telephone Encounter (Signed)
Pt thinks he might have gout call [phone] after 1000am

## 2013-10-25 NOTE — Telephone Encounter (Signed)
Called pt. Yesterday morning pt could not walk on his right foot due to the pain. Pt has been walking with his cane since yesterday. Pt stated few years back he had a gout flare up in right foot. Pt states the burning he was having has subsided.  Please advise, Thanks!

## 2013-10-25 NOTE — Telephone Encounter (Signed)
Please offer ov tues

## 2013-10-26 ENCOUNTER — Encounter: Payer: Self-pay | Admitting: Endocrinology

## 2013-10-26 ENCOUNTER — Ambulatory Visit (INDEPENDENT_AMBULATORY_CARE_PROVIDER_SITE_OTHER): Payer: Medicare Other | Admitting: Endocrinology

## 2013-10-26 VITALS — BP 138/84 | HR 55 | Temp 98.0°F | Ht 67.0 in | Wt 288.0 lb

## 2013-10-26 DIAGNOSIS — M79671 Pain in right foot: Secondary | ICD-10-CM | POA: Diagnosis not present

## 2013-10-26 DIAGNOSIS — M961 Postlaminectomy syndrome, not elsewhere classified: Secondary | ICD-10-CM | POA: Diagnosis not present

## 2013-10-26 DIAGNOSIS — M5136 Other intervertebral disc degeneration, lumbar region: Secondary | ICD-10-CM | POA: Diagnosis not present

## 2013-10-26 MED ORDER — COLCHICINE 0.6 MG PO TABS: ORAL_TABLET | ORAL | Status: DC

## 2013-10-26 NOTE — Progress Notes (Signed)
Subjective:    Patient ID: Donald [name], male    DOB: [birth year], 68 y.o.   MRN: [id number]  HPI Pt states 1 week of moderate pain at the lateral aspect of the right foot, but no assoc numbness.  No local injury.    Since on the increased gabapentin, pain is better overall.  However, he then had sudden-onset pain at the lateral aspect of the right foot.  It persists, but only in the context of weight-bearing.  Pt says many years ago, he had severe pain there, and it responded to colchicine.   Past Medical History  Diagnosis Date  . DIABETES MELLITUS, TYPE II 07/26/2006  . DYSLIPIDEMIA 04/13/2009  . ANEMIA-IRON DEFICIENCY 07/26/2006  . Pernicious anemia 11/20/2006  . DEPRESSION 03/14/2009  . PERIPHERAL NEUROPATHY 07/26/2006  . HYPERTENSION 07/26/2006  . PULMONARY EMBOLISM 10/05/2007  . D V T 12/03/2007  . ASTHMA 07/26/2006  . GERD 07/26/2006  . [place] DISEASE, LUMBAR 10/05/2007  . INSOMNIA 08/21/2007  . OBSTRUCTIVE SLEEP APNEA 12/03/2007  . COLONIC POLYPS, HX OF 07/26/2006    Past Surgical History  Procedure Laterality Date  . Appendectomy  1968  . Back surgery  1977, 06/14/2008    Dr. Trenton Gammon at [facility] (06/10)    History   Social History  . Marital Status: Married    Spouse Name: N/A    Number of Children: N/A  . Years of Education: N/A   Occupational History  . Disabled    Social History Main Topics  . Smoking status: Never Smoker   . Smokeless tobacco: Never Used  . Alcohol Use: No  . Drug Use: No  . Sexual Activity: Not on file   Other Topics Concern  . 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   Activity is severely limited by medical problems   Diet is "good".   Never a smoker   Alcohol: none          Current Outpatient Prescriptions on File Prior to Visit  Medication Sig Dispense Refill  . ALPRAZolam (XANAX) 1 MG tablet TAKE ONE TABLET BY MOUTH AT BEDTIME AS NEEDED FOR  SLEEP  30 tablet  5  . aspirin 81 MG chewable tablet Chew 81 mg by mouth daily.      . cyanocobalamin (,VITAMIN B-12,) 1000 MCG/ML injection 1 injection every 3 months       . EPINEPHrine (EPIPEN) 0.3 mg/0.3 mL DEVI Inject 0.3 mLs (0.3 mg total) into the muscle as needed.  2 Device  1  . fluticasone (FLONASE) 50 MCG/ACT nasal spray Place 2 sprays into both nostrils as needed.      . furosemide (LASIX) 80 MG tablet TAKE ONE TABLET EVERY DAY  30 tablet  2  . gabapentin (NEURONTIN) 300 MG capsule Take 1 capsule (300 mg total) by mouth 2 (two) times daily.  60 capsule  11  . HYDROcodone-acetaminophen (NORCO) 10-325 MG per tablet Take 1 tablet by mouth every 4 (four) hours as needed for pain.  50 tablet  5  . insulin lispro (HUMALOG) 100 UNIT/ML injection Inject into the skin 3 (three) times daily before meals. 3 times a day (just before each meal) 180-150-240 units      . insulin NPH Human (HUMULIN N,NOVOLIN N) 100 UNIT/ML injection 260 Units at bedtime.       Marland Kitchen losartan (COZAAR) 100 MG tablet Take 100 mg by mouth  daily.      . lovastatin (MEVACOR) 40 MG tablet TAKE TWO TABLETS BY MOUTH AT BEDTIME  60 tablet  2  . omeprazole (PRILOSEC) 20 MG capsule Take 20 mg by mouth daily.      Marland Kitchen PULMICORT FLEXHALER 180 MCG/ACT inhaler Inhale 2 puffs into the lungs 2 (two) times daily.      . sildenafil (REVATIO) 20 MG tablet 1-5 pills, as needed  90 tablet  11  . VENTOLIN HFA 108 (90 BASE) MCG/ACT inhaler 2 puffs every 4-6 hours as needed for wheezing/shortness of breath       No current facility-administered medications on file prior to visit.    Allergies  Allergen Reactions  . Codeine     REACTION: chest pain  . Niacin-Lovastatin Er   . Pioglitazone     REACTION to Actos: swelling in ankles    Family History  Problem Relation Age of Onset  . Cancer Mother     Breast Cancer  . Cancer Sister     Breast Cancer    BP 138/84  Pulse 55  Temp(Src) 98 F (36.7 C) (Oral)  Ht 5\' 7"  (1.702 m)  Wt  288 lb (130.636 kg)  BMI 45.10 kg/m2  SpO2 96%   Review of Systems Denies fever.      Objective:   Physical Exam VITAL SIGNS:  See vs page GENERAL: no distress Right foot: at the lateral aspect, there is slight tenderness/warmth/swelling.    Radiol: i reviewed x-ray report.     Assessment & Plan:  Foot pain, persistent, possibly acute gouty attack. Dyslipidemia: lovastatin can interact with colchicine.   Patient is advised the following: Patient Instructions  i have sent a prescription to your pharmacy, for the colchicine.  Take 1 every hr, until the pain is better.  Stop if you get diarrhea.   You will get better much faster if you elevate your foot above the rest of your body.   Skip the lovastatin on any day you take the colchicine.

## 2013-10-26 NOTE — Telephone Encounter (Signed)
Pt advised of note below. Pt to come around 10:15 for appointment

## 2013-10-26 NOTE — Patient Instructions (Addendum)
i have sent a prescription to your pharmacy, for the colchicine.  Take 1 every hr, until the pain is better.  Stop if you get diarrhea.   You will get better much faster if you elevate your foot above the rest of your body.   Skip the lovastatin on any day you take the colchicine.

## 2013-10-29 DIAGNOSIS — H6060 Unspecified chronic otitis externa, unspecified ear: Secondary | ICD-10-CM | POA: Diagnosis not present

## 2013-10-29 DIAGNOSIS — J301 Allergic rhinitis due to pollen: Secondary | ICD-10-CM | POA: Diagnosis not present

## 2013-10-29 DIAGNOSIS — H6980 Other specified disorders of Eustachian tube, unspecified ear: Secondary | ICD-10-CM | POA: Diagnosis not present

## 2013-10-29 DIAGNOSIS — H903 Sensorineural hearing loss, bilateral: Secondary | ICD-10-CM | POA: Diagnosis not present

## 2013-11-08 ENCOUNTER — Other Ambulatory Visit: Payer: Self-pay | Admitting: Endocrinology

## 2013-11-17 DIAGNOSIS — H906 Mixed conductive and sensorineural hearing loss, bilateral: Secondary | ICD-10-CM | POA: Diagnosis not present

## 2013-11-19 ENCOUNTER — Telehealth: Payer: Self-pay | Admitting: Endocrinology

## 2013-11-19 MED ORDER — GABAPENTIN 600 MG PO TABS: 600.0000 mg | ORAL_TABLET | Freq: Three times a day (TID) | ORAL | Status: DC

## 2013-11-19 NOTE — Telephone Encounter (Signed)
Ok, please double the gabapentin.  i have sent a prescription to your pharmacy

## 2013-11-19 NOTE — Telephone Encounter (Signed)
Patient notified

## 2013-11-19 NOTE — Telephone Encounter (Signed)
Patient is still having pain in his right foot he is taking Gabapentin 300 mg. Please advise

## 2013-12-13 ENCOUNTER — Other Ambulatory Visit: Payer: Self-pay | Admitting: Endocrinology

## 2013-12-16 NOTE — Telephone Encounter (Signed)
Patient is returning your call.  

## 2013-12-17 ENCOUNTER — Ambulatory Visit: Payer: No Typology Code available for payment source | Admitting: Endocrinology

## 2013-12-17 NOTE — Telephone Encounter (Signed)
Contacted pt and advised that our office had not contacted.

## 2013-12-20 DIAGNOSIS — H903 Sensorineural hearing loss, bilateral: Secondary | ICD-10-CM | POA: Diagnosis not present

## 2013-12-20 DIAGNOSIS — H6121 Impacted cerumen, right ear: Secondary | ICD-10-CM | POA: Diagnosis not present

## 2013-12-20 DIAGNOSIS — H6982 Other specified disorders of Eustachian tube, left ear: Secondary | ICD-10-CM | POA: Diagnosis not present

## 2014-01-03 ENCOUNTER — Other Ambulatory Visit: Payer: Self-pay | Admitting: Endocrinology

## 2014-01-12 ENCOUNTER — Other Ambulatory Visit: Payer: Self-pay | Admitting: Endocrinology

## 2014-01-12 MED ORDER — INSULIN LISPRO 100 UNIT/ML ~~LOC~~ SOLN: SUBCUTANEOUS | Status: DC

## 2014-01-24 DIAGNOSIS — D2272 Melanocytic nevi of left lower limb, including hip: Secondary | ICD-10-CM | POA: Diagnosis not present

## 2014-01-24 DIAGNOSIS — D2261 Melanocytic nevi of right upper limb, including shoulder: Secondary | ICD-10-CM | POA: Diagnosis not present

## 2014-01-24 DIAGNOSIS — D235 Other benign neoplasm of skin of trunk: Secondary | ICD-10-CM | POA: Diagnosis not present

## 2014-01-24 DIAGNOSIS — B079 Viral wart, unspecified: Secondary | ICD-10-CM | POA: Diagnosis not present

## 2014-01-24 DIAGNOSIS — D2262 Melanocytic nevi of left upper limb, including shoulder: Secondary | ICD-10-CM | POA: Diagnosis not present

## 2014-01-24 DIAGNOSIS — D225 Melanocytic nevi of trunk: Secondary | ICD-10-CM | POA: Diagnosis not present

## 2014-01-24 DIAGNOSIS — D485 Neoplasm of uncertain behavior of skin: Secondary | ICD-10-CM | POA: Diagnosis not present

## 2014-01-27 ENCOUNTER — Encounter: Payer: Self-pay | Admitting: Endocrinology

## 2014-01-27 ENCOUNTER — Ambulatory Visit (INDEPENDENT_AMBULATORY_CARE_PROVIDER_SITE_OTHER): Payer: Medicare Other | Admitting: Endocrinology

## 2014-01-27 VITALS — BP 128/60 | HR 54 | Temp 97.8°F | Ht 67.0 in | Wt 286.0 lb

## 2014-01-27 DIAGNOSIS — E1041 Type 1 diabetes mellitus with diabetic mononeuropathy: Secondary | ICD-10-CM | POA: Diagnosis not present

## 2014-01-27 DIAGNOSIS — R0602 Shortness of breath: Secondary | ICD-10-CM | POA: Insufficient documentation

## 2014-01-27 DIAGNOSIS — N183 Chronic kidney disease, stage 3 unspecified: Secondary | ICD-10-CM | POA: Insufficient documentation

## 2014-01-27 DIAGNOSIS — E1122 Type 2 diabetes mellitus with diabetic chronic kidney disease: Secondary | ICD-10-CM | POA: Insufficient documentation

## 2014-01-27 DIAGNOSIS — D509 Iron deficiency anemia, unspecified: Secondary | ICD-10-CM

## 2014-01-27 DIAGNOSIS — N289 Disorder of kidney and ureter, unspecified: Secondary | ICD-10-CM

## 2014-01-27 DIAGNOSIS — Z125 Encounter for screening for malignant neoplasm of prostate: Secondary | ICD-10-CM

## 2014-01-27 DIAGNOSIS — R0600 Dyspnea, unspecified: Secondary | ICD-10-CM | POA: Diagnosis not present

## 2014-01-27 DIAGNOSIS — IMO0002 Reserved for concepts with insufficient information to code with codable children: Secondary | ICD-10-CM

## 2014-01-27 DIAGNOSIS — E1049 Type 1 diabetes mellitus with other diabetic neurological complication: Secondary | ICD-10-CM

## 2014-01-27 DIAGNOSIS — E1065 Type 1 diabetes mellitus with hyperglycemia: Principal | ICD-10-CM

## 2014-01-27 LAB — BASIC METABOLIC PANEL
BUN: 37 mg/dL — ABNORMAL HIGH (ref 6–23)
CO2: 33 mEq/L — ABNORMAL HIGH (ref 19–32)
Calcium: 9.2 mg/dL (ref 8.4–10.5)
Chloride: 103 mEq/L (ref 96–112)
Creatinine, Ser: 2.06 mg/dL — ABNORMAL HIGH (ref 0.40–1.50)
GFR: 34.5 mL/min — ABNORMAL LOW (ref >60.00)
GLUCOSE: 99 mg/dL (ref 70–99)
POTASSIUM: 4.6 meq/L (ref 3.5–5.1)
Sodium: 144 mEq/L (ref 135–145)

## 2014-01-27 LAB — BRAIN NATRIURETIC PEPTIDE: PRO B NATRI PEPTIDE: 30 pg/mL (ref 0.0–100.0)

## 2014-01-27 LAB — MICROALBUMIN / CREATININE URINE RATIO
CREATININE, U: 59.2 mg/dL
MICROALB/CREAT RATIO: 4.4 mg/g (ref 0.0–30.0)
Microalb, Ur: 2.6 mg/dL — ABNORMAL HIGH (ref 0.0–1.9)

## 2014-01-27 LAB — HEMOGLOBIN A1C: Hgb A1c MFr Bld: 8 % — ABNORMAL HIGH (ref 4.6–6.5)

## 2014-01-27 LAB — TSH: TSH: 3.32 u[IU]/mL (ref 0.35–4.50)

## 2014-01-27 LAB — LIPID PANEL
CHOL/HDL RATIO: 4
Cholesterol: 103 mg/dL (ref 0–200)
HDL: 23.9 mg/dL — ABNORMAL LOW (ref >39.00)
LDL Cholesterol: 56 mg/dL (ref 0–99)
NonHDL: 79.1
Triglycerides: 115 mg/dL (ref 0.0–149.0)
VLDL: 23 mg/dL (ref 0.0–40.0)

## 2014-01-27 LAB — PSA, MEDICARE: PSA: 0.71 ng/ml (ref 0.10–4.00)

## 2014-01-27 MED ORDER — CYANOCOBALAMIN 1000 MCG/ML IJ SOLN
1000.0000 ug | Freq: Once | INTRAMUSCULAR | Status: AC
Start: 2014-01-27 — End: 2014-01-27
  Administered 2014-01-27: 1000 ug via INTRAMUSCULAR

## 2014-01-27 NOTE — Progress Notes (Signed)
Subjective:    Patient ID: Donald [name], male    DOB: [birth year], 69 y.o.   MRN: [id number]  HPI Pt returns for f/u of diabetes mellitus: DM type: 1 Dx'ed: 123XX123 Complications: polyneuropathy, CAD and nephropathy Therapy: insulin since 1995 DKA: never Severe hypoglycemia: never Pancreatitis: never Other: he takes multiple daily injections; he declines weight-loss surgery; characterized by severe insulin resistance Interval history: Last steroid injection was 3 mos ago.  he brings a record of his cbg's which i have reviewed today.  It varies from 106-170.  There is no trend throughout the day.  He has doe with 1 flight of stairs.  Past Medical History  Diagnosis Date  . DIABETES MELLITUS, TYPE II 07/26/2006  . DYSLIPIDEMIA 04/13/2009  . ANEMIA-IRON DEFICIENCY 07/26/2006  . Pernicious anemia 11/20/2006  . DEPRESSION 03/14/2009  . PERIPHERAL NEUROPATHY 07/26/2006  . HYPERTENSION 07/26/2006  . PULMONARY EMBOLISM 10/05/2007  . D V T 12/03/2007  . ASTHMA 07/26/2006  . GERD 07/26/2006  . [place] DISEASE, LUMBAR 10/05/2007  . INSOMNIA 08/21/2007  . OBSTRUCTIVE SLEEP APNEA 12/03/2007  . COLONIC POLYPS, HX OF 07/26/2006    Past Surgical History  Procedure Laterality Date  . Appendectomy  1968  . Back surgery  1977, 06/14/2008    Dr. Trenton Gammon at [facility] (06/10)    History   Social History  . Marital Status: Married    Spouse Name: N/A    Number of Children: N/A  . Years of Education: N/A   Occupational History  . Disabled    Social History Main Topics  . Smoking status: Never Smoker   . Smokeless tobacco: Never Used  . Alcohol Use: No  . Drug Use: No  . Sexual Activity: Not on file   Other Topics Concern  . 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   Activity is severely limited by medical problems   Diet is "good".   Never a smoker   Alcohol: none          Current Outpatient Prescriptions  on File Prior to Visit  Medication Sig Dispense Refill  . ALPRAZolam (XANAX) 1 MG tablet TAKE ONE TABLET AT BEDTIME AS NEEDED FORSLEEP 30 tablet 5  . aspirin 81 MG chewable tablet Chew 81 mg by mouth daily.    . colchicine 0.6 MG tablet 1 pill every hour, until the gout is better, or if you get diarrhea, or if you reach a maximum of 6 pills per day. 30 tablet 0  . cyanocobalamin (,VITAMIN B-12,) 1000 MCG/ML injection 1 injection every 3 months     . EPINEPHrine (EPIPEN) 0.3 mg/0.3 mL DEVI Inject 0.3 mLs (0.3 mg total) into the muscle as needed. 2 Device 1  . fluticasone (FLONASE) 50 MCG/ACT nasal spray Place 2 sprays into both nostrils as needed.    . furosemide (LASIX) 80 MG tablet TAKE ONE TABLET BY MOUTH EVERY DAY 30 tablet 2  . gabapentin (NEURONTIN) 600 MG tablet Take 1 tablet (600 mg total) by mouth 3 (three) times daily. 60 tablet 11  . HUMULIN N 100 UNIT/ML injection INJECT 340 UNITS INTO THE SKIN AT BEDTIME 20 mL 1  . HYDROcodone-acetaminophen (NORCO) 10-325 MG per tablet Take 1 tablet by mouth every 4 (four) hours as needed for pain. 50 tablet 5  . insulin lispro (HUMALOG) 100 UNIT/ML injection 3 times a day (just before each meal) 180-150-240 units. [place]  mL 0  . insulin NPH Human (HUMULIN N,NOVOLIN N) 100 UNIT/ML injection 260 Units at bedtime.     Marland Kitchen losartan (COZAAR) 100 MG tablet Take 100 mg by mouth daily.    Marland Kitchen lovastatin (MEVACOR) 40 MG tablet TAKE TWO TABLETS AT BEDTIME 60 tablet 2  . omeprazole (PRILOSEC) 20 MG capsule Take 20 mg by mouth daily.    Marland Kitchen PULMICORT FLEXHALER 180 MCG/ACT inhaler Inhale 2 puffs into the lungs 2 (two) times daily.    . sildenafil (REVATIO) 20 MG tablet 1-5 pills, as needed 90 tablet 11  . VENTOLIN HFA 108 (90 BASE) MCG/ACT inhaler 2 puffs every 4-6 hours as needed for wheezing/shortness of breath     No current facility-administered medications on file prior to visit.    Allergies  Allergen Reactions  . Codeine     REACTION: chest pain  .  Niacin-Lovastatin Er   . Pioglitazone     REACTION to Actos: swelling in ankles    Family History  Problem Relation Age of Onset  . Cancer Mother     Breast Cancer  . Cancer Sister     Breast Cancer    BP 128/60 mmHg  Pulse 54  Temp(Src) 97.8 F (36.6 C) (Oral)  Ht 5\' 7"  (1.702 m)  Wt 286 lb (129.729 kg)  BMI 44.78 kg/m2  SpO2 97%   Review of Systems He denies hypoglycemia and weight change.      Objective:   Physical Exam VITAL SIGNS:  See vs page GENERAL: no distress Pulses: dorsalis pedis intact bilat.  Feet: no deformity. feet are of normal color and temp. 1+ bilat leg edema.  Skin: no ulcer on the feet.  Neuro: sensation is intact to touch on the feet, but decreased from normal.   Lab Results  Component Value Date   HGBA1C 8.0* 01/27/2014   BNP=normal  Lab Results  Component Value Date   CHOL 103 01/27/2014   HDL 23.90* 01/27/2014   LDLCALC 56 01/27/2014   LDLDIRECT 52.2 05/25/2009   TRIG 115.0 01/27/2014   CHOLHDL 4 01/27/2014      Assessment & Plan:  DM: moderate exacerbation Edema, but no evidence of CHF Dyslipidemia: well-controlled   Patient is advised the following: Patient Instructions  Please come back for a regular physical appointment in 3 months. blood tests are being requested for you today.  We'll let you know about the results. check your blood sugar twice a day.  vary the time of day when you check, between before the 3 meals, and at bedtime.  also check if you have symptoms of your blood sugar being too high or too low.  please keep a record of the readings and bring it to your next appointment here.  You can write it on any piece of paper.  please call us sooner if your blood sugar goes below 70, or if you have a lot of readings over 200.    increase humalog to 3 times a day (just before each meal) 190-160-250 units.

## 2014-01-27 NOTE — Patient Instructions (Addendum)
Please come back for a regular physical appointment in 3 months. blood tests are being requested for you today.  We'll let you know about the results. check your blood sugar twice a day.  vary the time of day when you check, between before the 3 meals, and at bedtime.  also check if you have symptoms of your blood sugar being too high or too low.  please keep a record of the readings and bring it to your next appointment here.  You can write it on any piece of paper.  please call us sooner if your blood sugar goes below 70, or if you have a lot of readings over 200.

## 2014-01-28 ENCOUNTER — Other Ambulatory Visit: Payer: Self-pay | Admitting: Endocrinology

## 2014-02-07 ENCOUNTER — Telehealth: Payer: Self-pay | Admitting: Endocrinology

## 2014-02-07 NOTE — Telephone Encounter (Signed)
Contacted [facility] about referral they will sent to Nephrology. [facility] to contact pt about appointment.

## 2014-02-07 NOTE — Telephone Encounter (Signed)
Patient stated that he never got a call for and appt about his creatinine level being high. Please advise

## 2014-02-08 ENCOUNTER — Other Ambulatory Visit: Payer: Self-pay | Admitting: Endocrinology

## 2014-02-09 ENCOUNTER — Telehealth: Payer: Self-pay | Admitting: Endocrinology

## 2014-02-09 NOTE — Telephone Encounter (Signed)
Patient state that he was referred to a specialist by Dr Loanne Drilling for his high creatinine level, he haven't heard anything from anyone.  Please advise

## 2014-02-09 NOTE — Telephone Encounter (Signed)
Contacted pt and lvom advising the referral has been sent to Nephrology and he would be receiving a phone call about a day and time for appointment once the MD's from the offfice review.

## 2014-03-04 DIAGNOSIS — J0101 Acute recurrent maxillary sinusitis: Secondary | ICD-10-CM | POA: Diagnosis not present

## 2014-03-04 DIAGNOSIS — R05 Cough: Secondary | ICD-10-CM | POA: Diagnosis not present

## 2014-03-04 DIAGNOSIS — E119 Type 2 diabetes mellitus without complications: Secondary | ICD-10-CM | POA: Diagnosis not present

## 2014-03-04 LAB — HM DIABETES EYE EXAM

## 2014-03-10 ENCOUNTER — Other Ambulatory Visit: Payer: Self-pay | Admitting: Endocrinology

## 2014-03-17 DIAGNOSIS — D225 Melanocytic nevi of trunk: Secondary | ICD-10-CM | POA: Diagnosis not present

## 2014-03-17 DIAGNOSIS — L905 Scar conditions and fibrosis of skin: Secondary | ICD-10-CM | POA: Diagnosis not present

## 2014-04-09 ENCOUNTER — Other Ambulatory Visit: Payer: Self-pay | Admitting: Endocrinology

## 2014-04-11 NOTE — Telephone Encounter (Signed)
Please advise if ok to refill during Dr. Cordelia Pen absence. Thanks!

## 2014-04-14 DIAGNOSIS — N2581 Secondary hyperparathyroidism of renal origin: Secondary | ICD-10-CM | POA: Diagnosis not present

## 2014-04-14 DIAGNOSIS — I129 Hypertensive chronic kidney disease with stage 1 through stage 4 chronic kidney disease, or unspecified chronic kidney disease: Secondary | ICD-10-CM | POA: Diagnosis not present

## 2014-04-14 DIAGNOSIS — D509 Iron deficiency anemia, unspecified: Secondary | ICD-10-CM | POA: Diagnosis not present

## 2014-04-14 DIAGNOSIS — N183 Chronic kidney disease, stage 3 (moderate): Secondary | ICD-10-CM | POA: Diagnosis not present

## 2014-04-18 ENCOUNTER — Other Ambulatory Visit: Payer: Self-pay | Admitting: Endocrinology

## 2014-04-21 DIAGNOSIS — E875 Hyperkalemia: Secondary | ICD-10-CM | POA: Diagnosis not present

## 2014-04-26 ENCOUNTER — Other Ambulatory Visit: Payer: Self-pay | Admitting: Nephrology

## 2014-04-26 DIAGNOSIS — Q631 Lobulated, fused and horseshoe kidney: Principal | ICD-10-CM

## 2014-04-26 DIAGNOSIS — N200 Calculus of kidney: Secondary | ICD-10-CM

## 2014-04-27 ENCOUNTER — Other Ambulatory Visit: Payer: No Typology Code available for payment source

## 2014-04-28 ENCOUNTER — Ambulatory Visit
Admission: RE | Admit: 2014-04-28 | Discharge: 2014-04-28 | Disposition: A | Discharge status: Home / Self Care | Payer: Medicare Other | Source: Ambulatory Visit | Attending: Nephrology | Admitting: Nephrology

## 2014-04-28 ENCOUNTER — Encounter: Payer: Self-pay | Admitting: Endocrinology

## 2014-04-28 ENCOUNTER — Ambulatory Visit (INDEPENDENT_AMBULATORY_CARE_PROVIDER_SITE_OTHER): Payer: Medicare Other | Admitting: Endocrinology

## 2014-04-28 ENCOUNTER — Ambulatory Visit: Payer: Medicare Other | Admitting: Endocrinology

## 2014-04-28 VITALS — BP 140/62 | HR 61 | Temp 97.7°F | Ht 67.0 in | Wt 283.0 lb

## 2014-04-28 DIAGNOSIS — N200 Calculus of kidney: Secondary | ICD-10-CM

## 2014-04-28 DIAGNOSIS — Q631 Lobulated, fused and horseshoe kidney: Principal | ICD-10-CM

## 2014-04-28 DIAGNOSIS — D510 Vitamin B12 deficiency anemia due to intrinsic factor deficiency: Secondary | ICD-10-CM | POA: Diagnosis not present

## 2014-04-28 DIAGNOSIS — R944 Abnormal results of kidney function studies: Secondary | ICD-10-CM | POA: Diagnosis not present

## 2014-04-28 DIAGNOSIS — E1049 Type 1 diabetes mellitus with other diabetic neurological complication: Secondary | ICD-10-CM

## 2014-04-28 DIAGNOSIS — IMO0002 Reserved for concepts with insufficient information to code with codable children: Secondary | ICD-10-CM

## 2014-04-28 DIAGNOSIS — E1065 Type 1 diabetes mellitus with hyperglycemia: Principal | ICD-10-CM

## 2014-04-28 DIAGNOSIS — Z87442 Personal history of urinary calculi: Secondary | ICD-10-CM | POA: Diagnosis not present

## 2014-04-28 DIAGNOSIS — E1041 Type 1 diabetes mellitus with diabetic mononeuropathy: Secondary | ICD-10-CM | POA: Diagnosis not present

## 2014-04-28 LAB — BASIC METABOLIC PANEL
BUN: 38 mg/dL — ABNORMAL HIGH (ref 6–23)
CALCIUM: 9.7 mg/dL (ref 8.4–10.5)
CO2: 31 mEq/L (ref 19–32)
CREATININE: 1.87 mg/dL — AB (ref 0.40–1.50)
Chloride: 102 mEq/L (ref 96–112)
GFR: 38.55 mL/min — AB (ref >60.00)
GLUCOSE: 98 mg/dL (ref 70–99)
Potassium: 3.9 mEq/L (ref 3.5–5.1)
Sodium: 141 mEq/L (ref 135–145)

## 2014-04-28 LAB — HEMOGLOBIN A1C: Hgb A1c MFr Bld: 7.3 % — ABNORMAL HIGH (ref 4.6–6.5)

## 2014-04-28 MED ORDER — CYANOCOBALAMIN 1000 MCG/ML IJ SOLN
1000.0000 ug | Freq: Once | INTRAMUSCULAR | Status: AC
  Administered 2014-04-28: 1000 ug via INTRAMUSCULAR

## 2014-04-28 NOTE — Progress Notes (Signed)
Subjective:    Patient ID: Donald [name], male    DOB: 05-Jun-1945, 69 y.o.   MRN: [id number]  HPI Pt returns for f/u of diabetes mellitus: DM type: 1 Dx'ed: 123XX123 Complications: polyneuropathy, CAD and nephropathy Therapy: insulin since 1995 DKA: never.  Severe hypoglycemia: never.  Pancreatitis: never Other: he takes multiple daily injections; he declines weight-loss surgery; he has severe insulin resistance. Interval history: He says he does not eat breakfast.  He takes 170 units with lunch and 180 units with the evening meal.  He takes 300 units of NPH at hs (he reduced due to nocturnal hypoglycemia).  he brings a record of his cbg's which i have reviewed today, checked at lunch and at supper.  It varies from 76-169.  There is no trend throughout the day.  Past Medical History  Diagnosis Date  . DIABETES MELLITUS, TYPE II 07/26/2006  . DYSLIPIDEMIA 04/13/2009  . ANEMIA-IRON DEFICIENCY 07/26/2006  . Pernicious anemia 11/20/2006  . DEPRESSION 03/14/2009  . PERIPHERAL NEUROPATHY 07/26/2006  . HYPERTENSION 07/26/2006  . PULMONARY EMBOLISM 10/05/2007  . D V T 12/03/2007  . ASTHMA 07/26/2006  . GERD 07/26/2006  . [place] DISEASE, LUMBAR 10/05/2007  . INSOMNIA 08/21/2007  . OBSTRUCTIVE SLEEP APNEA 12/03/2007  . COLONIC POLYPS, HX OF 07/26/2006    Past Surgical History  Procedure Laterality Date  . Appendectomy  1968  . Back surgery  1977, 06/14/2008    Dr. Trenton Gammon at [facility] (06/10)    History   Social History  . Marital Status: Married    Spouse Name: N/A  . Number of Children: N/A  . Years of Education: N/A   Occupational History  . Disabled    Social History Main Topics  . Smoking status: Never Smoker   . Smokeless tobacco: Never Used  . Alcohol Use: No  . Drug Use: No  . Sexual Activity: Not on file   Other Topics Concern  . 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   Activity is  severely limited by medical problems   Diet is "good".   Never a smoker   Alcohol: none          Current Outpatient Prescriptions on File Prior to Visit  Medication Sig Dispense Refill  . ALPRAZolam (XANAX) 1 MG tablet TAKE ONE TABLET AT BEDTIME AS NEEDED FORSLEEP 30 tablet 0  . aspirin 81 MG chewable tablet Chew 81 mg by mouth daily.    . colchicine 0.6 MG tablet 1 pill every hour, until the gout is better, or if you get diarrhea, or if you reach a maximum of 6 pills per day. 30 tablet 0  . cyanocobalamin (,VITAMIN B-12,) 1000 MCG/ML injection 1 injection every 3 months     . EPINEPHrine (EPIPEN) 0.3 mg/0.3 mL DEVI Inject 0.3 mLs (0.3 mg total) into the muscle as needed. 2 Device 1  . fluticasone (FLONASE) 50 MCG/ACT nasal spray Place 2 sprays into both nostrils as needed.    . furosemide (LASIX) 80 MG tablet TAKE ONE TABLET BY MOUTH EVERY DAY 30 tablet 2  . gabapentin (NEURONTIN) 600 MG tablet Take 1 tablet (600 mg total) by mouth 3 (three) times daily. (Patient taking differently: Take 600 mg by mouth 2 (two) times daily. ) 60 tablet 11  . HUMALOG 100 UNIT/ML injection INJECT 3 TIMES DAILY JUST BEFORE EACH MEAL 180-150-240 170 mL 2  . HUMULIN  N 100 UNIT/ML injection INJECT 340 UNITS INTO THE SKIN AT BEDTIME 20 mL 1  . HYDROcodone-acetaminophen (NORCO) 10-325 MG per tablet Take 1 tablet by mouth every 4 (four) hours as needed for pain. 50 tablet 5  . insulin NPH Human (HUMULIN N,NOVOLIN N) 100 UNIT/ML injection 260 Units at bedtime.     Marland Kitchen losartan (COZAAR) 100 MG tablet Take 100 mg by mouth daily.    Marland Kitchen losartan (COZAAR) 100 MG tablet TAKE TWO TABLETS BY MOUTH AT BEDTIME 60 tablet 2  . lovastatin (MEVACOR) 40 MG tablet TAKE 2 TABLETS AT BEDTIME 60 tablet 2  . omeprazole (PRILOSEC) 20 MG capsule Take 20 mg by mouth daily.    Marland Kitchen PULMICORT FLEXHALER 180 MCG/ACT inhaler Inhale 2 puffs into the lungs 2 (two) times daily.    . sildenafil (REVATIO) 20 MG tablet 1-5 pills, as needed 90 tablet 11    . VENTOLIN HFA 108 (90 BASE) MCG/ACT inhaler 2 puffs every 4-6 hours as needed for wheezing/shortness of breath     No current facility-administered medications on file prior to visit.    Allergies  Allergen Reactions  . Codeine     REACTION: chest pain  . Niacin-Lovastatin Er   . Pioglitazone     REACTION to Actos: swelling in ankles    Family History  Problem Relation Age of Onset  . Cancer Mother     Breast Cancer  . Cancer Sister     Breast Cancer    BP 140/62 mmHg  Pulse 61  Temp(Src) 97.7 F (36.5 C) (Oral)  Ht 5\' 7"  (1.702 m)  Wt 283 lb (128.368 kg)  BMI 44.31 kg/m2  SpO2 98%  Review of Systems He denies hypoglycemia.    Objective:   Physical Exam VITAL SIGNS:  See vs page GENERAL: no distress.  Morbid obesity Pulses: dorsalis pedis intact bilat.  Feet: no deformity. feet are of normal color and temp. 1+ bilat leg edema.  Skin: no ulcer on the feet.  Neuro: sensation is intact to touch on the feet, but decreased from normal.    Lab Results  Component Value Date   HGBA1C 7.3* 04/28/2014      Assessment & Plan:  DM: glycemic control is improved.  Renal failure: improved.   Patient is advised the following: Patient Instructions  blood tests are requested for you today.  We'll let you know about the results. check your blood sugar twice a day.  vary the time of day when you check, between before the 3 meals, and at bedtime.  also check if you have symptoms of your blood sugar being too high or too low.  please keep a record of the readings and bring it to your next appointment here.  You can write it on any piece of paper.  please call us sooner if your blood sugar goes below 70, or if you have a lot of readings over 200.  Please come back for a regular physical appointment in 3 months.    addendum: Please continue the same insulin.

## 2014-04-28 NOTE — Patient Instructions (Addendum)
blood tests are requested for you today.  We'll let you know about the results. check your blood sugar twice a day.  vary the time of day when you check, between before the 3 meals, and at bedtime.  also check if you have symptoms of your blood sugar being too high or too low.  please keep a record of the readings and bring it to your next appointment here.  You can write it on any piece of paper.  please call us sooner if your blood sugar goes below 70, or if you have a lot of readings over 200.  Please come back for a regular physical appointment in 3 months.

## 2014-05-04 ENCOUNTER — Telehealth: Payer: Self-pay

## 2014-05-04 ENCOUNTER — Encounter: Payer: Medicare Other | Attending: Endocrinology | Admitting: Nutrition

## 2014-05-04 NOTE — Progress Notes (Signed)
Patient is here with his wife to discuss his diet.  He says that his kidney doctor told him he needs to be on a low potasium diet/low protein , his family doctor put him on a 2 week bariatric diet--that is high in protein, and Dr. Hale Bogus wants him on a diabetic diet.  He does not know what to eat??.  He was given a list of high potasium foods, and he is feeling overwhelmed.    I explained that I can not talk to him about his diet, because his medicare will not cover me.  I explained that I would call the dietitians upstairs, and get him an appt., ASAP.  He was very upset and so I told him that I would arrange the appt. ASAP  We did discuss his insulin resistance, and ways he can decrease this, with weight loss, exercise and diet.  He reported good understanding of this.  Discussed the effects of foods on blood sugar readings.  He reports that he has cut out all snacking of cake, cookies, icecream, and chips.  Says has lost 7 pounds in 2 weeks.    Discussed the need for exercise and he was given some chair exercises with weights by his son, and will start doing them.  Encouraged him to work up to 30 min. QOD.

## 2014-05-04 NOTE — Addendum Note (Signed)
Addended by: Moody Bruins E on: 05/04/2014 01:21 PM   Modules accepted: Orders, Medications

## 2014-05-04 NOTE — Telephone Encounter (Signed)
Pt came to the office today to see [facility]. While he was in the office he had questions about his Losartan. Pt sated a couple of months ago he started taking 2 pills of the Losartan. Pt stated he did not recognize his prescription stated 2 pills daily. Pt went to have lab work done at his [facility] and his potassium was "sky high". Pt has since been taking 1 tablet daily of the Losartan. Pt wanted to know if it was ok to continue taking the 1 pill per day. His last potassium level was 3.9.  Please advise, Thanks!

## 2014-05-04 NOTE — Telephone Encounter (Signed)
It is ok to take 1 per day. Please also note that potassium was normal at our office 6 days ago.

## 2014-05-04 NOTE — Telephone Encounter (Signed)
Left voicemail advising of note below. Requested call back if pt would like to discuss.

## 2014-05-04 NOTE — Patient Instructions (Signed)
Do chair exercises for 30 min. 4-5 days/wk. Continue to limit between meal snacking

## 2014-05-10 ENCOUNTER — Other Ambulatory Visit: Payer: Self-pay | Admitting: Endocrinology

## 2014-05-13 ENCOUNTER — Ambulatory Visit: Payer: No Typology Code available for payment source | Admitting: Dietician

## 2014-05-20 ENCOUNTER — Encounter: Payer: Self-pay | Admitting: Dietician

## 2014-05-20 ENCOUNTER — Encounter: Payer: Medicare Other | Attending: Endocrinology | Admitting: Dietician

## 2014-05-20 VITALS — Ht 66.0 in | Wt 282.0 lb

## 2014-05-20 DIAGNOSIS — Z794 Long term (current) use of insulin: Secondary | ICD-10-CM | POA: Insufficient documentation

## 2014-05-20 DIAGNOSIS — E118 Type 2 diabetes mellitus with unspecified complications: Secondary | ICD-10-CM | POA: Diagnosis not present

## 2014-05-20 DIAGNOSIS — N289 Disorder of kidney and ureter, unspecified: Secondary | ICD-10-CM | POA: Diagnosis not present

## 2014-05-20 DIAGNOSIS — Z713 Dietary counseling and surveillance: Secondary | ICD-10-CM | POA: Diagnosis not present

## 2014-05-20 NOTE — Progress Notes (Signed)
  Medical Nutrition Therapy:  Appt start time: 0945 end time:  1115.   Assessment:  Primary concerns today: Patient is here with his wife.  He is concerned about the best nutritional recommendation with combination of medical problems including Type 2 Diabetes (since 1990) with severe insulin resistance and newly diagnosed stage 3 CKD with an elevated potassium.  He has been following a low potassium diet.  Potassium 5.4 (04/14/14) and decreased to 3.9 (04/28/14).  Weight was 288 lbs 2 1/2 months ago.  Today's weight of 282 lbs.  HgbA1C:  8% 01/27/14 and 7.3% 04/28/14.  He checks CBG's up to four times daily:  Am CBG 67-159, before dinner 68-157 per his log.  Patient lives with wife and watches his 43 month old grandson 2-3 days per week.  They have been eating out a lot as neither wishes to cook.   Preferred Learning Style:   No preference indicated   Learning Readiness:   Ready  Change in progress   MEDICATIONS: see list which includes Humalog and Humulin   DIETARY INTAKE: Patient cooks and have been eating out a lot lately.  ([employer] or [employer]).  Used to eat desserts and graze all evening and just take more insulin but he has not been doing that.  Used to drink 3-4 diet coke per day and decreased to 1 diet soda daily. Used to drink sweet tea.    24-hr recall:  B (10:30-11:00 AM): scrambled eggs with or without cheese and 2 pieces of sausage and thin bagel or wrap with bacon and egg  Snk ( AM): greek yogurt or sugar free yogurt or fruit L ( PM): none Snk ( PM): greek yogurt or sugar free yogurt or peanut butter and crackers D ( PM): hamburger fast food or meat, starch, and vege, and stewed apples out to eat.   Snk ( PM): sugar free pudding Beverages: 1 diet coke or diet gingerale or diet orange soda, water or water with truvia and orange slices,    Usual physical activity: none  Estimated energy needs: 1600 calories 180 g carbohydrates 100 g protein 53 g fat  Progress Towards  Goal(s):  In progress.   Nutritional Diagnosis:  NB-1.1 Food and nutrition-related knowledge deficit As related to renal and diabetic diet.  As evidenced by diet hx and patient report.    Intervention:  Nutrition Education and counseling regarding renal/diabetic diet.  Emphasis of discussion on reducing sodium in the diet.  Showed labels of restaurant foods consumed and sodium content.  Discussed benefits of eating at home more often.  Weight loss counseling.  Reduce the sodium intake of your diet.  Be mindful when eating out.  Try to cook more at home. Season with Mrs. Dash, McCormicks salt free, lemon, lime juice or vinegar or herbs and spices.   Never use salt substitute, (Light salt or No salt) Great job in the changes that you have made (reducing portion sizes, changing drinks, changing snack habits)! Recommend walking or chair exercises daily.   Avoid excessive animal protein.  Try Almond milk instead of dairy.  Teaching Method Utilized:  Visual Auditory Hands on  Handouts given during visit include:  Renal book  Snack list  Barriers to learning/adherence to lifestyle change: none  Demonstrated degree of understanding via:  Teach Back   Monitoring/Evaluation:  Dietary intake, exercise, label reading, and body weight prn.

## 2014-05-20 NOTE — Patient Instructions (Signed)
Reduce the sodium intake of your diet.  Be mindful when eating out.  Try to cook more at home. Season with Mrs. Dash, [employer] salt free, lemon, lime juice or vinegar or herbs and spices.   Never use salt substitute, (Light salt or No salt) Great job in the changes that you have made (reducing portion sizes, changing drinks, changing snack habits)! Recommend walking or chair exercises daily.   Avoid excessive animal protein.  Try Almond milk instead of dairy.

## 2014-05-23 ENCOUNTER — Encounter
Admission: RE | Admit: 2014-05-23 | Discharge: 2014-05-23 | Disposition: A | Discharge status: Home / Self Care | Payer: Medicare Other | Source: Ambulatory Visit | Attending: Ophthalmology | Admitting: Ophthalmology

## 2014-05-23 DIAGNOSIS — Z01812 Encounter for preprocedural laboratory examination: Secondary | ICD-10-CM | POA: Diagnosis not present

## 2014-05-23 DIAGNOSIS — H25013 Cortical age-related cataract, bilateral: Secondary | ICD-10-CM | POA: Diagnosis not present

## 2014-05-23 DIAGNOSIS — I10 Essential (primary) hypertension: Secondary | ICD-10-CM | POA: Diagnosis not present

## 2014-05-23 DIAGNOSIS — Z01810 Encounter for preprocedural cardiovascular examination: Secondary | ICD-10-CM | POA: Diagnosis not present

## 2014-05-23 LAB — POTASSIUM: Potassium: 3.9 mmol/L (ref 3.5–5.1)

## 2014-05-23 LAB — HEMOGLOBIN: Hemoglobin: 13.1 g/dL (ref 13.0–18.0)

## 2014-05-25 ENCOUNTER — Encounter: Payer: Self-pay | Admitting: *Deleted

## 2014-05-25 DIAGNOSIS — H269 Unspecified cataract: Secondary | ICD-10-CM | POA: Diagnosis present

## 2014-05-25 DIAGNOSIS — H2512 Age-related nuclear cataract, left eye: Secondary | ICD-10-CM | POA: Diagnosis not present

## 2014-05-25 DIAGNOSIS — E1140 Type 2 diabetes mellitus with diabetic neuropathy, unspecified: Secondary | ICD-10-CM | POA: Diagnosis not present

## 2014-05-25 DIAGNOSIS — Z9889 Other specified postprocedural states: Secondary | ICD-10-CM | POA: Diagnosis not present

## 2014-05-25 DIAGNOSIS — Z888 Allergy status to other drugs, medicaments and biological substances status: Secondary | ICD-10-CM | POA: Diagnosis not present

## 2014-05-25 DIAGNOSIS — E780 Pure hypercholesterolemia: Secondary | ICD-10-CM | POA: Diagnosis not present

## 2014-05-25 DIAGNOSIS — Z86711 Personal history of pulmonary embolism: Secondary | ICD-10-CM | POA: Diagnosis not present

## 2014-05-25 DIAGNOSIS — E875 Hyperkalemia: Secondary | ICD-10-CM | POA: Diagnosis not present

## 2014-05-25 DIAGNOSIS — Z794 Long term (current) use of insulin: Secondary | ICD-10-CM | POA: Diagnosis not present

## 2014-05-25 DIAGNOSIS — R011 Cardiac murmur, unspecified: Secondary | ICD-10-CM | POA: Diagnosis not present

## 2014-05-25 DIAGNOSIS — Z885 Allergy status to narcotic agent status: Secondary | ICD-10-CM | POA: Diagnosis not present

## 2014-05-25 DIAGNOSIS — F329 Major depressive disorder, single episode, unspecified: Secondary | ICD-10-CM | POA: Diagnosis not present

## 2014-05-25 DIAGNOSIS — Z79899 Other long term (current) drug therapy: Secondary | ICD-10-CM | POA: Diagnosis not present

## 2014-05-25 DIAGNOSIS — N183 Chronic kidney disease, stage 3 (moderate): Secondary | ICD-10-CM | POA: Diagnosis not present

## 2014-05-25 DIAGNOSIS — J45909 Unspecified asthma, uncomplicated: Secondary | ICD-10-CM | POA: Diagnosis not present

## 2014-05-25 DIAGNOSIS — I129 Hypertensive chronic kidney disease with stage 1 through stage 4 chronic kidney disease, or unspecified chronic kidney disease: Secondary | ICD-10-CM | POA: Diagnosis not present

## 2014-05-25 DIAGNOSIS — G4730 Sleep apnea, unspecified: Secondary | ICD-10-CM | POA: Diagnosis not present

## 2014-05-25 DIAGNOSIS — Z7982 Long term (current) use of aspirin: Secondary | ICD-10-CM | POA: Diagnosis not present

## 2014-05-25 DIAGNOSIS — Z87442 Personal history of urinary calculi: Secondary | ICD-10-CM | POA: Diagnosis not present

## 2014-05-26 NOTE — OR Nursing (Signed)
Ekg ok per Dr Ronelle Nigh

## 2014-05-30 ENCOUNTER — Ambulatory Visit: Payer: Medicare Other | Admitting: Anesthesiology

## 2014-05-30 ENCOUNTER — Encounter: Payer: Self-pay | Admitting: *Deleted

## 2014-05-30 ENCOUNTER — Ambulatory Visit
Admission: RE | Admit: 2014-05-30 | Discharge: 2014-05-30 | Disposition: A | Discharge status: Home / Self Care | Payer: Medicare Other | Source: Ambulatory Visit | Attending: Ophthalmology | Admitting: Ophthalmology

## 2014-05-30 ENCOUNTER — Encounter
Admission: RE | Disposition: A | Discharge status: Home / Self Care | Payer: Self-pay | Source: Ambulatory Visit | Attending: Ophthalmology

## 2014-05-30 DIAGNOSIS — G4730 Sleep apnea, unspecified: Secondary | ICD-10-CM | POA: Diagnosis not present

## 2014-05-30 DIAGNOSIS — Z79899 Other long term (current) drug therapy: Secondary | ICD-10-CM | POA: Insufficient documentation

## 2014-05-30 DIAGNOSIS — I10 Essential (primary) hypertension: Secondary | ICD-10-CM | POA: Diagnosis not present

## 2014-05-30 DIAGNOSIS — H2512 Age-related nuclear cataract, left eye: Secondary | ICD-10-CM | POA: Insufficient documentation

## 2014-05-30 DIAGNOSIS — E780 Pure hypercholesterolemia: Secondary | ICD-10-CM | POA: Diagnosis not present

## 2014-05-30 DIAGNOSIS — I129 Hypertensive chronic kidney disease with stage 1 through stage 4 chronic kidney disease, or unspecified chronic kidney disease: Secondary | ICD-10-CM | POA: Insufficient documentation

## 2014-05-30 DIAGNOSIS — N183 Chronic kidney disease, stage 3 (moderate): Secondary | ICD-10-CM | POA: Insufficient documentation

## 2014-05-30 DIAGNOSIS — F329 Major depressive disorder, single episode, unspecified: Secondary | ICD-10-CM | POA: Insufficient documentation

## 2014-05-30 DIAGNOSIS — R011 Cardiac murmur, unspecified: Secondary | ICD-10-CM | POA: Insufficient documentation

## 2014-05-30 DIAGNOSIS — Z9889 Other specified postprocedural states: Secondary | ICD-10-CM | POA: Insufficient documentation

## 2014-05-30 DIAGNOSIS — Z888 Allergy status to other drugs, medicaments and biological substances status: Secondary | ICD-10-CM | POA: Insufficient documentation

## 2014-05-30 DIAGNOSIS — Z794 Long term (current) use of insulin: Secondary | ICD-10-CM | POA: Insufficient documentation

## 2014-05-30 DIAGNOSIS — Z86711 Personal history of pulmonary embolism: Secondary | ICD-10-CM | POA: Insufficient documentation

## 2014-05-30 DIAGNOSIS — Z87442 Personal history of urinary calculi: Secondary | ICD-10-CM | POA: Insufficient documentation

## 2014-05-30 DIAGNOSIS — Z885 Allergy status to narcotic agent status: Secondary | ICD-10-CM | POA: Insufficient documentation

## 2014-05-30 DIAGNOSIS — E1140 Type 2 diabetes mellitus with diabetic neuropathy, unspecified: Secondary | ICD-10-CM | POA: Insufficient documentation

## 2014-05-30 DIAGNOSIS — Z7982 Long term (current) use of aspirin: Secondary | ICD-10-CM | POA: Insufficient documentation

## 2014-05-30 DIAGNOSIS — J45909 Unspecified asthma, uncomplicated: Secondary | ICD-10-CM | POA: Diagnosis not present

## 2014-05-30 DIAGNOSIS — H2513 Age-related nuclear cataract, bilateral: Secondary | ICD-10-CM | POA: Diagnosis not present

## 2014-05-30 HISTORY — DX: Other chronic pain: G89.29

## 2014-05-30 HISTORY — DX: Reserved for inherently not codable concepts without codable children: IMO0001

## 2014-05-30 HISTORY — DX: Bell's palsy: G51.0

## 2014-05-30 HISTORY — DX: Gout, unspecified: M10.9

## 2014-05-30 HISTORY — DX: Edema, unspecified: R60.9

## 2014-05-30 HISTORY — DX: Polyneuropathy, unspecified: G62.9

## 2014-05-30 HISTORY — PX: CATARACT EXTRACTION W/PHACO: SHX586

## 2014-05-30 HISTORY — DX: Cardiac murmur, unspecified: R01.1

## 2014-05-30 HISTORY — DX: Dorsalgia, unspecified: M54.9

## 2014-05-30 LAB — GLUCOSE, CAPILLARY: Glucose-Capillary: 75 mg/dL (ref 65–99)

## 2014-05-30 SURGERY — PHACOEMULSIFICATION, CATARACT, WITH IOL INSERTION
Anesthesia: Monitor Anesthesia Care | Laterality: Left

## 2014-05-30 MED ORDER — NA CHONDROIT SULF-NA HYALURON 40-17 MG/ML IO SOLN
INTRAOCULAR | Status: AC
  Filled 2014-05-30: qty 1

## 2014-05-30 MED ORDER — CYCLOPENTOLATE HCL 2 % OP SOLN
OPHTHALMIC | Status: AC
  Filled 2014-05-30: qty 2

## 2014-05-30 MED ORDER — PHENYLEPHRINE HCL 10 % OP SOLN
OPHTHALMIC | Status: AC
  Filled 2014-05-30: qty 5

## 2014-05-30 MED ORDER — CYCLOPENTOLATE HCL 2 % OP SOLN
1.0000 [drp] | OPHTHALMIC | Status: AC
  Administered 2014-05-30 (×3): 1 [drp] via OPHTHALMIC
  Administered 2014-05-30: 07:00:00 via OPHTHALMIC

## 2014-05-30 MED ORDER — LIDOCAINE HCL (PF) 4 % IJ SOLN
INTRAMUSCULAR | Status: AC
  Filled 2014-05-30: qty 5

## 2014-05-30 MED ORDER — CARBACHOL 0.01 % IO SOLN
INTRAOCULAR | Status: DC | PRN
  Administered 2014-05-30: 0.5 mL via INTRAOCULAR

## 2014-05-30 MED ORDER — TRYPAN BLUE 0.06 % OP SOLN
OPHTHALMIC | Status: AC
  Filled 2014-05-30: qty 0.5

## 2014-05-30 MED ORDER — NA CHONDROIT SULF-NA HYALURON 40-17 MG/ML IO SOLN
INTRAOCULAR | Status: DC | PRN
  Administered 2014-05-30: 1 mL via INTRAOCULAR

## 2014-05-30 MED ORDER — ALFENTANIL 500 MCG/ML IJ INJ
INJECTION | INTRAMUSCULAR | Status: DC | PRN
  Administered 2014-05-30: 1000 ug via INTRAVENOUS

## 2014-05-30 MED ORDER — TETRACAINE HCL 0.5 % OP SOLN
OPHTHALMIC | Status: AC
  Filled 2014-05-30: qty 2

## 2014-05-30 MED ORDER — PHENYLEPHRINE HCL 10 % OP SOLN
1.0000 [drp] | OPHTHALMIC | Status: AC
  Administered 2014-05-30: 07:00:00 via OPHTHALMIC
  Administered 2014-05-30 (×3): 1 [drp] via OPHTHALMIC

## 2014-05-30 MED ORDER — MOXIFLOXACIN HCL 0.5 % OP SOLN - NO CHARGE
OPHTHALMIC | Status: DC | PRN
  Administered 2014-05-30: 1 [drp] via OPHTHALMIC

## 2014-05-30 MED ORDER — LIDOCAINE HCL (PF) 4 % IJ SOLN
INTRAMUSCULAR | Status: DC | PRN
  Administered 2014-05-30: 5 mL via OPHTHALMIC

## 2014-05-30 MED ORDER — HYDRALAZINE HCL 20 MG/ML IJ SOLN
INTRAMUSCULAR | Status: DC | PRN
  Administered 2014-05-30: 10 mg via INTRAVENOUS

## 2014-05-30 MED ORDER — EPINEPHRINE HCL 1 MG/ML IJ SOLN
INTRAMUSCULAR | Status: AC
  Filled 2014-05-30: qty 1

## 2014-05-30 MED ORDER — MOXIFLOXACIN HCL 0.5 % OP SOLN
1.0000 [drp] | OPHTHALMIC | Status: AC
  Administered 2014-05-30: 1 [drp] via OPHTHALMIC
  Administered 2014-05-30: 07:00:00 via OPHTHALMIC
  Administered 2014-05-30: 1 [drp] via OPHTHALMIC

## 2014-05-30 MED ORDER — TETRACAINE HCL 0.5 % OP SOLN
OPHTHALMIC | Status: DC | PRN
  Administered 2014-05-30: 1 [drp] via OPHTHALMIC

## 2014-05-30 MED ORDER — MOXIFLOXACIN HCL 0.5 % OP SOLN
OPHTHALMIC | Status: AC
  Filled 2014-05-30: qty 3

## 2014-05-30 MED ORDER — PROPARACAINE HCL 0.5 % OP SOLN
OPHTHALMIC | Status: AC
  Filled 2014-05-30: qty 15

## 2014-05-30 MED ORDER — SODIUM CHLORIDE 0.9 % IV SOLN
INTRAVENOUS | Status: DC
Start: 2014-05-30 — End: 2014-05-30
  Administered 2014-05-30 (×2): via INTRAVENOUS

## 2014-05-30 MED ORDER — EPINEPHRINE HCL 1 MG/ML IJ SOLN
INTRAMUSCULAR | Status: DC | PRN
  Administered 2014-05-30: 200 mL

## 2014-05-30 MED ORDER — HYALURONIDASE HUMAN 150 UNIT/ML IJ SOLN
INTRAMUSCULAR | Status: AC
  Filled 2014-05-30: qty 1

## 2014-05-30 MED ORDER — BUPIVACAINE HCL (PF) 0.75 % IJ SOLN
INTRAMUSCULAR | Status: AC
  Filled 2014-05-30: qty 10

## 2014-05-30 MED ORDER — CEFUROXIME OPHTHALMIC INJECTION 1 MG/0.1 ML
INJECTION | OPHTHALMIC | Status: AC
  Filled 2014-05-30: qty 0.1

## 2014-05-30 SURGICAL SUPPLY — 27 items
CENTURION ACTIVE FMS ×1 IMPLANT
CORD BIP STRL DISP 12FT (MISCELLANEOUS) ×2 IMPLANT
DRAPE XRAY CASSETTE 23X24 (DRAPES) ×2 IMPLANT
ERASER HMR WETFIELD 18G (MISCELLANEOUS) ×2 IMPLANT
GLOVE BIO SURGEON STRL SZ8 (GLOVE) ×2 IMPLANT
GLOVE SURG LX 6.5 MICRO (GLOVE) ×1
GLOVE SURG LX 8.0 MICRO (GLOVE) ×1
GLOVE SURG LX STRL 6.5 MICRO (GLOVE) ×1 IMPLANT
GLOVE SURG LX STRL 8.0 MICRO (GLOVE) ×1 IMPLANT
GOWN STRL REUS W/ TWL LRG LVL3 (GOWN DISPOSABLE) ×1 IMPLANT
GOWN STRL REUS W/ TWL XL LVL3 (GOWN DISPOSABLE) ×1 IMPLANT
GOWN STRL REUS W/TWL LRG LVL3 (GOWN DISPOSABLE) ×2
GOWN STRL REUS W/TWL XL LVL3 (GOWN DISPOSABLE) ×2
LENS IOL ACRYSERT 19.0 (Intraocular Lens) ×1 IMPLANT
PACK CATARACT (MISCELLANEOUS) ×2 IMPLANT
PACK CATARACT DINGLEDEIN LX (MISCELLANEOUS) ×2 IMPLANT
PACK EYE AFTER SURG (MISCELLANEOUS) ×2 IMPLANT
SHLD EYE VISITEC  UNIV (MISCELLANEOUS) ×2 IMPLANT
SN6CWS19.0 ×1 IMPLANT
SOL PREP PVP 2OZ (MISCELLANEOUS) ×2
SOLUTION PREP PVP 2OZ (MISCELLANEOUS) ×1 IMPLANT
SUT SILK 5-0 (SUTURE) ×2 IMPLANT
SYR 5ML LL (SYRINGE) ×2 IMPLANT
SYR TB 1ML 27GX1/2 LL (SYRINGE) ×2 IMPLANT
WATER STERILE IRR 1000ML POUR (IV SOLUTION) ×2 IMPLANT
WIPE NON LINTING 3.25X3.25 (MISCELLANEOUS) ×2 IMPLANT
sn6cws19.0 ×2 IMPLANT

## 2014-05-30 NOTE — H&P (Signed)
  History and physical was faxed and scanned in.   

## 2014-05-30 NOTE — Transfer of Care (Addendum)
Immediate Anesthesia Transfer of Care Note  Patient: Donald [name]  Procedure(s) Performed: Procedure(s) with comments: CATARACT EXTRACTION PHACO AND INTRAOCULAR LENS PLACEMENT (I[facility]) (Left) - [name] 01:20 AP% 23.7 CDE 31.86  Patient Location: PACU  Anesthesia Type:MAC  Level of Consciousness: awake, alert  and oriented  Airway & Oxygen Therapy: Patient Spontanous Breathing  Post-op Assessment: Report given to RN and Post -op Vital signs reviewed and stable  Post vital signs: Reviewed and stable  Last Vitals:  Filed Vitals:   05/30/14 0823  BP: 148/41  Pulse:   Temp:   Resp:     Complications: No apparent anesthesia complications

## 2014-05-30 NOTE — Anesthesia Postprocedure Evaluation (Signed)
  Anesthesia Post-op Note  Patient: Donald [name]  Procedure(s) Performed: Procedure(s) with comments: CATARACT EXTRACTION PHACO AND INTRAOCULAR LENS PLACEMENT ([facility]) (Left) - [name] 01:20 AP% 23.7 CDE 31.86  Anesthesia type:MAC  Patient location: PACU  Post pain: Pain level controlled  Post assessment: Post-op Vital signs reviewed, Patient's Cardiovascular Status Stable, Respiratory Function Stable, Patent Airway and No signs of Nausea or vomiting  Post vital signs: Reviewed and stable  Last Vitals:  Filed Vitals:   05/30/14 0823  BP: 148/41  Pulse:   Temp:   Resp:     Level of consciousness: awake, alert  and patient cooperative  Complications: No apparent anesthesia complications

## 2014-05-30 NOTE — Op Note (Signed)
Date of Surgery: 05/30/2014 Date of Dictation: 05/30/2014 8:24 AM Pre-operative Diagnosis:Nuclear Sclerotic Cataract, Posterior Subcapsular Cataract, Cortical Cataract and Mature Cataract left Eye Post-operative Diagnosis: same Procedure performed: Extra-capsular Cataract Extraction (ECCE) with placement of a posterior chamber intraocular lens (IOL) left Eye IOL:  Implant Name Type Inv. Item Serial No. Manufacturer Lot No. LRB No. Used  [id number]         [id number] Left 1   Anesthesia: 2% Lidocaine and 4% Marcaine in a 50/50 mixture with 10 unites/ml of Hylenex given as a peribulbar Anesthesiologist: Anesthesiologist: Molli Barrows, MD CRNA: Bernardo Heater, CRNA Complications: none Estimated Blood Loss: less than 1 ml  Description of procedure:  The patient was given anesthesia and sedation via intravenous access. The patient was then prepped and draped in the usual fashion. A 25-gauge needle was bent for initiating the capsulorhexis. A 5-0 silk suture was placed through the conjunctiva superior and inferiorly to serve as bridle sutures. Hemostasis was obtained at the superior limbus using an eraser cautery. A partial thickness groove was made at the anterior surgical limbus with a 64 Beaver blade and this was dissected anteriorly with an Avaya. The anterior chamber was entered at 10 o'clock with a 1.0 mm paracentesis knife and through the lamellar dissection with a 2.6 mm Alcon keratome. DiscoVisc was injected to replace the aqueous and a continuous tear curvilinear capsulorhexis was performed using a bent 25-gauge needle.  Balance salt on a syringe was used to perform hydro-dissection and phacoemulsification was carried out using a divide and conquer technique. Procedure(s) with comments: CATARACT EXTRACTION PHACO AND INTRAOCULAR LENS PLACEMENT (I[facility]) (Left) - [name] 01:20 AP% 23.7 CDE 31.86. Irrigation/aspiration was used to remove the residual cortex and the capsular bag was  inflated with DiscoVisc. The intraocular lens was inserted into the capsular bag using a pre-loaded Acrysert Delivery System. Irrigation/aspiration was used to remove the residual DiscoVisc. The wound was inflated with balanced salt and checked for leaks. None were found. Miostat was injected via the paracentesis track and 0.1 ml of cefuroxime containing 1 mg of drug  was injected via the paracentesis track. The wound was checked for leaks again and none were found.   The bridal sutures were removed and two drops of Vigamox were placed on the eye. An eye shield was placed to protect the eye and the patient was discharged to the recovery area in good condition.   Stephonie Wilcoxen MD

## 2014-05-30 NOTE — Anesthesia Preprocedure Evaluation (Signed)
Anesthesia Evaluation  Patient identified by MRN, date of birth, ID band Patient awake    Reviewed: Allergy & Precautions, H&P , NPO status , Patient's Chart, lab work & pertinent test results, reviewed documented beta blocker date and time   Airway Mallampati: II  TM Distance: >3 FB Neck ROM: full    Dental no notable dental hx.    Pulmonary neg pulmonary ROS, shortness of breath, asthma , sleep apnea ,  breath sounds clear to auscultation  Pulmonary exam normal       Cardiovascular Exercise Tolerance: Good hypertension, + CAD negative cardio ROS  + Valvular Problems/Murmurs Rhythm:regular Rate:Normal     Neuro/Psych  Neuromuscular disease negative neurological ROS  negative psych ROS   GI/Hepatic negative GI ROS, Neg liver ROS, GERD-  ,  Endo/Other  negative endocrine ROSdiabetes  Renal/GU Renal diseasenegative Renal ROS  negative genitourinary   Musculoskeletal   Abdominal   Peds  Hematology negative hematology ROS (+) anemia ,   Anesthesia Other Findings   Reproductive/Obstetrics negative OB ROS                             Anesthesia Physical Anesthesia Plan  ASA: IV  Anesthesia Plan: MAC   Post-op Pain Management:    Induction:   Airway Management Planned:   Additional Equipment:   Intra-op Plan:   Post-operative Plan:   Informed Consent: I have reviewed the patients History and Physical, chart, labs and discussed the procedure including the risks, benefits and alternatives for the proposed anesthesia with the patient or authorized representative who has indicated his/her understanding and acceptance.   Dental Advisory Given  Plan Discussed with: CRNA  Anesthesia Plan Comments:         Anesthesia Quick Evaluation

## 2014-05-30 NOTE — Interval H&P Note (Signed)
History and Physical Interval Note:  05/30/2014 7:19 AM  Donald [name]  has presented today for surgery, with the diagnosis of CATARACT  The various methods of treatment have been discussed with the patient and family. After consideration of risks, benefits and other options for treatment, the patient has consented to  Procedure(s): CATARACT EXTRACTION PHACO AND INTRAOCULAR LENS PLACEMENT ([place]) (Left) as a surgical intervention .  The patient's history has been reviewed, patient examined, no change in status, stable for surgery.  I have reviewed the patient's chart and labs.  Questions were answered to the patient's satisfaction.     [name]

## 2014-05-30 NOTE — Progress Notes (Signed)
Report given to Nolene Ebbs, RN by Phillips Grout, RN [phone]

## 2014-05-30 NOTE — Discharge Instructions (Addendum)
See handout.Eye Surgery Discharge Instructions  Expect mild scratchy sensation or mild soreness. DO NOT RUB YOUR EYE!  The day of surgery:  Minimal physical activity, but bed rest is not required  No reading, computer work, or close hand work  No bending, lifting, or straining.  May watch TV  For 24 hours:  No driving, legal decisions, or alcoholic beverages  Safety precautions  Eat anything you prefer: It is better to start with liquids, then soup then solid foods.  _____ Eye patch should be worn until postoperative exam tomorrow.  ____ Solar shield eyeglasses should be worn for comfort in the sunlight/patch while sleeping  Resume all regular medications including aspirin or Coumadin if these were discontinued prior to surgery. You may shower, bathe, shave, or wash your hair. Tylenol may be taken for mild discomfort.  Call your doctor if you experience significant pain, nausea, or vomiting, fever > 101 or other signs of infection. [phone] or [phone] Specific instructions:

## 2014-05-31 ENCOUNTER — Encounter: Payer: Self-pay | Admitting: Ophthalmology

## 2014-06-02 ENCOUNTER — Other Ambulatory Visit: Payer: Self-pay | Admitting: Endocrinology

## 2014-06-06 ENCOUNTER — Other Ambulatory Visit: Payer: Self-pay | Admitting: Pulmonary Disease

## 2014-06-06 ENCOUNTER — Other Ambulatory Visit: Payer: Self-pay | Admitting: Internal Medicine

## 2014-06-06 DIAGNOSIS — G4733 Obstructive sleep apnea (adult) (pediatric): Secondary | ICD-10-CM

## 2014-06-07 ENCOUNTER — Encounter: Payer: Self-pay | Admitting: Pulmonary Disease

## 2014-06-07 ENCOUNTER — Ambulatory Visit (INDEPENDENT_AMBULATORY_CARE_PROVIDER_SITE_OTHER): Payer: Medicare Other | Admitting: Pulmonary Disease

## 2014-06-07 VITALS — BP 128/50 | HR 56 | Ht 66.0 in | Wt 284.6 lb

## 2014-06-07 DIAGNOSIS — J4520 Mild intermittent asthma, uncomplicated: Secondary | ICD-10-CM

## 2014-06-07 DIAGNOSIS — G4733 Obstructive sleep apnea (adult) (pediatric): Secondary | ICD-10-CM | POA: Diagnosis not present

## 2014-06-07 NOTE — Patient Instructions (Addendum)
CPAP is set at 15 cm Call as needed

## 2014-06-07 NOTE — Progress Notes (Signed)
   Subjective:    Patient ID: Donald [name], male    DOB: 1945-06-18, 69 y.o.   MRN: [id number]  HPI  69yowm never smoker for FU of obstructive sleep apnea & asthmatic bronchitis 1-2 episodes /year.  He developed DVT/pulmonary embolism 09/2007 -seemingly idiopathic,subseq underwent back surgery 06/2008 by Dr Donald [name]- Off coumadin since 10/2008  He also had a chronic cough, which was attributed to GERD.    Significant tests/ events  6/00: Polysomnogram at [facility], RDI 138/h, lowest desatn 77%,maintained on CPAP+ 15 with a nasal mask. ([facility], [place]) since.  3/ 2012- - rpt cpap titration at [place], on 15 cm   09/2007 >> duplex >>echogenic material is seen in the left profunda femoral, femoral and popliteal veins.  02/2008 >>residual thrombus in left prox to distal SFV  04/2008 residual thrombus in Lt SFV persists   11/2007 CT angio shows incomplete recanalisation of pulmonary arteries      06/07/2014  Chief Complaint  Patient presents with  . Follow-up    Breathing fine.  no complaints regarding breathing.  Wears CPAP machine every night and during naps. no complaints.    Diagnosed with stage 3 CKD - cr down to 1.5 He is completely off all pulmonary medications, 2015 - trial of Dulera for RAD /asthma says took sample but does not feel he needs this. No flare in cough or wheezing. Uses Albuterol rescue inhaler rarely.  He has used low-dose trazodone the past, now only on alprazolam as needed  Wearing CPAP all night and with naps.  avg 6-8 hr each night.  Not wearing toupee anymore !   Review of Systems neg for any significant sore throat, dysphagia, itching, sneezing, nasal congestion or excess/ purulent secretions, fever, chills, sweats, unintended wt loss, pleuritic or exertional cp, hempoptysis, orthopnea pnd or change in chronic leg swelling. Also denies presyncope, palpitations, heartburn, abdominal pain, nausea, vomiting, diarrhea  or change in bowel or urinary habits, dysuria,hematuria, rash, arthralgias, visual complaints, headache, numbness weakness or ataxia.     Objective:   Physical Exam  Gen. Pleasant, obese, in no distress ENT - no lesions, no post nasal drip Neck: No JVD, no thyromegaly, no carotid bruits Lungs: no use of accessory muscles, no dullness to percussion, decreased without rales or rhonchi  Cardiovascular: Rhythm regular, heart sounds  normal, no murmurs or gallops, no peripheral edema Musculoskeletal: No deformities, no cyanosis or clubbing , no tremors       Assessment & Plan:

## 2014-06-08 NOTE — Assessment & Plan Note (Signed)
CPAP 15 cm, compliant  Weight loss encouraged, compliance with goal of at least 4-6 hrs every night is the expectation. Advised against medications with sedative side effects Cautioned against driving when sleepy - understanding that sleepiness will vary on a day to day basis

## 2014-06-08 NOTE — Assessment & Plan Note (Signed)
He is off all pulmonary medications and that is okay

## 2014-06-15 NOTE — Telephone Encounter (Signed)
Patient need a refill of his Alprazolam and it needs approval before it it is filled.

## 2014-06-24 ENCOUNTER — Other Ambulatory Visit: Payer: Self-pay | Admitting: Endocrinology

## 2014-07-11 DIAGNOSIS — D509 Iron deficiency anemia, unspecified: Secondary | ICD-10-CM | POA: Diagnosis not present

## 2014-07-11 DIAGNOSIS — N183 Chronic kidney disease, stage 3 (moderate): Secondary | ICD-10-CM | POA: Diagnosis not present

## 2014-07-14 DIAGNOSIS — E1122 Type 2 diabetes mellitus with diabetic chronic kidney disease: Secondary | ICD-10-CM | POA: Diagnosis not present

## 2014-07-14 DIAGNOSIS — I129 Hypertensive chronic kidney disease with stage 1 through stage 4 chronic kidney disease, or unspecified chronic kidney disease: Secondary | ICD-10-CM | POA: Diagnosis not present

## 2014-07-14 DIAGNOSIS — D509 Iron deficiency anemia, unspecified: Secondary | ICD-10-CM | POA: Diagnosis not present

## 2014-07-14 DIAGNOSIS — N183 Chronic kidney disease, stage 3 (moderate): Secondary | ICD-10-CM | POA: Diagnosis not present

## 2014-07-27 DIAGNOSIS — M5136 Other intervertebral disc degeneration, lumbar region: Secondary | ICD-10-CM | POA: Diagnosis not present

## 2014-07-28 ENCOUNTER — Ambulatory Visit: Payer: Medicare Other | Admitting: Endocrinology

## 2014-08-04 ENCOUNTER — Ambulatory Visit: Payer: Medicare Other | Admitting: Endocrinology

## 2014-08-30 ENCOUNTER — Encounter: Payer: Self-pay | Admitting: Endocrinology

## 2014-08-30 ENCOUNTER — Ambulatory Visit (INDEPENDENT_AMBULATORY_CARE_PROVIDER_SITE_OTHER): Payer: Medicare Other | Admitting: Endocrinology

## 2014-08-30 VITALS — BP 132/64 | HR 53 | Temp 97.8°F | Ht 66.0 in | Wt 278.0 lb

## 2014-08-30 DIAGNOSIS — Z23 Encounter for immunization: Secondary | ICD-10-CM

## 2014-08-30 DIAGNOSIS — Z0000 Encounter for general adult medical examination without abnormal findings: Secondary | ICD-10-CM | POA: Diagnosis not present

## 2014-08-30 DIAGNOSIS — E1041 Type 1 diabetes mellitus with diabetic mononeuropathy: Secondary | ICD-10-CM | POA: Diagnosis not present

## 2014-08-30 DIAGNOSIS — M5416 Radiculopathy, lumbar region: Secondary | ICD-10-CM | POA: Diagnosis not present

## 2014-08-30 DIAGNOSIS — M5136 Other intervertebral disc degeneration, lumbar region: Secondary | ICD-10-CM | POA: Diagnosis not present

## 2014-08-30 DIAGNOSIS — E1049 Type 1 diabetes mellitus with other diabetic neurological complication: Secondary | ICD-10-CM

## 2014-08-30 DIAGNOSIS — IMO0002 Reserved for concepts with insufficient information to code with codable children: Secondary | ICD-10-CM

## 2014-08-30 DIAGNOSIS — E1065 Type 1 diabetes mellitus with hyperglycemia: Principal | ICD-10-CM

## 2014-08-30 LAB — POCT GLYCOSYLATED HEMOGLOBIN (HGB A1C): Hemoglobin A1C: 6.9

## 2014-08-30 MED ORDER — GLUCOSE BLOOD VI STRP: 1.0000 | ORAL_STRIP | Freq: Two times a day (BID) | Status: DC

## 2014-08-30 MED ORDER — SILDENAFIL CITRATE 20 MG PO TABS: ORAL_TABLET | ORAL | Status: DC

## 2014-08-30 NOTE — Progress Notes (Signed)
we discussed code status.  pt requests full code, but would not want to be started or maintained on artificial life-support measures if there was not a reasonable chance of recovery 

## 2014-08-30 NOTE — Patient Instructions (Addendum)
check your blood sugar twice a day.  vary the time of day when you check, between before the 3 meals, and at bedtime.  also check if you have symptoms of your blood sugar being too high or too low.  please keep a record of the readings and bring it to your next appointment here.  You can write it on any piece of paper.  please call us sooner if your blood sugar goes below 70, or if you have a lot of readings over 200.  Please come back for a follow-up appointment in 3-4 months.   Please continue the same insulin.  please consider these measures for your health:  minimize alcohol.  do not use tobacco products.  have a colonoscopy at least every 10 years from age 89.  keep firearms safely stored.  always use seat belts.  have working smoke alarms in your home.  see an eye doctor and dentist regularly.  never drive under the influence of alcohol or drugs (including prescription drugs).  those with fair skin should take precautions against the sun. it is critically important to prevent falling down (keep floor areas well-lit, dry, and free of loose objects.  If you have a cane, walker, or wheelchair, you should use it, even for short trips around the house.  Also, try not to rush).

## 2014-08-30 NOTE — Progress Notes (Signed)
Subjective:    Patient ID: Donald [name], male    DOB: 04/27/1945, 69 y.o.   MRN: :[id number]  HPI Pt returns for f/u of diabetes mellitus: DM type: 1 Dx'ed: [id number] Complications: polyneuropathy, CAD and nephropathy Therapy: insulin since 1995 DKA: never.  Severe hypoglycemia: never.  Pancreatitis: never Other: he takes multiple daily injections; he declines weight-loss surgery; he has severe insulin resistance. Interval history: he brings a record of his cbg's which i have reviewed today.  It is well-controlled, except after recent spinal steroid injection.   Past Medical History  Diagnosis Date  . DIABETES MELLITUS, TYPE II 07/26/2006  . DYSLIPIDEMIA 04/13/2009  . ANEMIA-IRON DEFICIENCY 07/26/2006  . Pernicious anemia 11/20/2006  . DEPRESSION 03/14/2009  . PERIPHERAL NEUROPATHY 07/26/2006  . HYPERTENSION 07/26/2006  . PULMONARY EMBOLISM 10/05/2007  . D V T 12/03/2007  . ASTHMA 07/26/2006  . GERD 07/26/2006  . [place] DISEASE, LUMBAR 10/05/2007  . INSOMNIA 08/21/2007  . OBSTRUCTIVE SLEEP APNEA 12/03/2007  . COLONIC POLYPS, HX OF 07/26/2006  . Shortness of breath dyspnea   . Heart murmur   . Neuropathy   . Bell's palsy   . Chronic kidney disease     stage 3 renal failure  . Gout   . Back pain, chronic   . Edema     ankle    Past Surgical History  Procedure Laterality Date  . Appendectomy  1968  . Back surgery  1977, 06/14/2008    Dr. Trenton Gam[facility] (06/10)  . Cardiac catheterization    . Tonsillectomy    . Carpal tunnel release    . Shoulder arthroscopy w/ rotator cuff repair    . Lithotripsy    . Cataract extraction w/phaco Left 05/30/2014    Procedure: CATARACT EXTRACTION PHACO AND INTRAOCULAR LENS PLACEMENT ([facility]);  Surgeon: Estill Cotta, MD;  Location: [facility] ORS;  Service: Ophthalmology;  Laterality: Left;  [name] 01:20 AP% 23.7 CDE 31.86    Social History   Social History  . Marital Status: Married    Spouse Name: N/A  . Number of Children: N/A  .  Years of Education: N/A   Occupational History  . Disabled    Social History Main Topics  . Smoking status: Never Smoker   . Smokeless tobacco: Never Used  . Alcohol Use: No  . Drug Use: No  . Sexual Activity: Not on file   Other Topics Concern  . 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   Activity is severely limited by medical problems   Diet is "good".   Never a smoker   Alcohol: none          Current Outpatient Prescriptions on File Prior to Visit  Medication Sig Dispense Refill  . ALPRAZolam (XANAX) 1 MG tablet TAKE ONE TABLET AT BEDTIME AS NEEDED FORSLEEP 30 tablet 5  . aspirin 81 MG chewable tablet Chew 81 mg by mouth daily.    Marland Kitchen COLCRYS 0.6 MG tablet 1 CAPSULE EVERY HOUR UNTIL GOUT IS BETTER OR DIARRHEA DEVELOPS OR YOUREACHA MAX OF 6 PER DAY 30 tablet 11  . cyanocobalamin (,VITAMIN B-12,) 1000 MCG/ML injection 1 injection every 3 months     . furosemide (LASIX) 80 MG tablet TAKE ONE TABLET BY MOUTH EVERY DAY 30 tablet 4  . gabapentin (NEURONTIN) 600 MG tablet Take 1 tablet (600 mg total) by mouth 3 (three) times daily. (Patient taking  differently: Take 600 mg by mouth 2 (two) times daily. ) 60 tablet 11  . HUMULIN N 100 UNIT/ML injection INJECT 340 UNITS INTO THE SKIN AT BEDTIME 200 mL 1  . HYDROcodone-acetaminophen (NORCO/VICODIN) 5-325 MG per tablet Take 1 tablet by mouth every 6 (six) hours as needed for moderate pain.    Marland Kitchen insulin lispro (HUMALOG) 100 UNIT/ML injection Inject into the skin 3 (three) times daily before meals. 180units breakfast 150 units with lunch and 200 units with the evening meal    . insulin NPH Human (HUMULIN N,NOVOLIN N) 100 UNIT/ML injection 260 Units at bedtime.     Marland Kitchen losartan (COZAAR) 100 MG tablet Take 1 tablet by mouth at bedtime 30 tablet 4  . lovastatin (MEVACOR) 40 MG tablet TAKE 2 TABLETS BY MOUTH AT BEDTIME 60 tablet 4  . Sennosides-Docusate Sodium (STOOL  SOFTENER & LAXATIVE PO) Take 2 tablets by mouth at bedtime.    . VENTOLIN HFA 108 (90 BASE) MCG/ACT inhaler 2 puffs every 4-6 hours as needed for wheezing/shortness of breath     No current facility-administered medications on file prior to visit.    Allergies  Allergen Reactions  . Codeine     REACTION: chest pain  . Niacin-Lovastatin Er   . Pioglitazone     REACTION to Actos: swelling in ankles    Family History  Problem Relation Age of Onset  . Cancer Mother     Breast Cancer  . Cancer Sister     Breast Cancer    BP 132/64 mmHg  Pulse 53  Temp(Src) 97.8 F (36.6 C) (Oral)  Ht 5\' 6"  (1.676 m)  Wt 278 lb (126.1 kg)  BMI 44.89 kg/m2  SpO2 98%  Review of Systems Low-back pain persists.  He has lost a few lbs, due to his efforts.      Objective:   Physical Exam VITAL SIGNS:  See vs page GENERAL: no distress Pulses: dorsalis pedis intact bilat.  Feet: no deformity. feet are of normal color and temp. 1+ bilat leg edema.  Skin: no ulcer on the feet.  Neuro: sensation is intact to touch on the feet, but decreased from normal.    A1c=6.9%    Assessment & Plan:  DM: well-controlled: Please continue the same insulin.     Subjective:   Patient here for Medicare annual wellness visit and management of other chronic and acute problems.     Risk factors: advanced age    70 of Physicians Providing Medical Care to Patient:  See "snapshot"   Activities of Daily Living: In your present state of health, do you have any difficulty performing the following activities?:  Preparing food and eating?: No  Bathing yourself: No  Getting dressed: No  Using the toilet:No  Moving around from place to place: No  In the past year have you fallen or had a near fall?:No    Home Safety: Has smoke detector and wears seat belts. No firearms. No excess sun exposure.  Diet and Exercise  Current exercise habits: pt says limited by pain Dietary issues discussed: pt reports a  healthy diet   Depression Screen  Q1: Over the past two weeks, have you felt down, depressed or hopeless? no  Q2: Over the past two weeks, have you felt little interest or pleasure in doing things? no   The following portions of the patient's history were reviewed and updated as appropriate: allergies, current medications, past family history, past medical history, past social history, past surgical  history and problem list.   Review of Systems  Denies hearing loss, and visual loss Objective:   Vision:  Sees opthalmologist Hearing: grossly normal Body mass index:  See vs page Msk: pt easily and quickly performs "get-up-and-go" from a sitting position Cognitive Impairment Assessment: cognition, memory and judgment appear normal.  remembers 3/3 at 5 minutes.  excellent recall.  can easily read and write a sentence.  alert and oriented x 3.     Assessment:   Medicare wellness utd on preventive parameters    Plan:   During the course of the visit the patient was educated and counseled about appropriate screening and preventive services including:        Fall prevention   Diabetes screening  Nutrition counseling   Vaccines / LABS Zostavax / Pneumococcal Vaccine  today  PSA  Patient Instructions (the written plan) was given to the patient.

## 2014-10-03 DIAGNOSIS — N183 Chronic kidney disease, stage 3 (moderate): Secondary | ICD-10-CM | POA: Diagnosis not present

## 2014-10-06 ENCOUNTER — Other Ambulatory Visit: Payer: Self-pay | Admitting: Endocrinology

## 2014-10-06 DIAGNOSIS — N183 Chronic kidney disease, stage 3 (moderate): Secondary | ICD-10-CM | POA: Diagnosis not present

## 2014-10-06 DIAGNOSIS — D509 Iron deficiency anemia, unspecified: Secondary | ICD-10-CM | POA: Diagnosis not present

## 2014-10-06 DIAGNOSIS — I129 Hypertensive chronic kidney disease with stage 1 through stage 4 chronic kidney disease, or unspecified chronic kidney disease: Secondary | ICD-10-CM | POA: Diagnosis not present

## 2014-10-06 DIAGNOSIS — E1122 Type 2 diabetes mellitus with diabetic chronic kidney disease: Secondary | ICD-10-CM | POA: Diagnosis not present

## 2014-10-26 DIAGNOSIS — I129 Hypertensive chronic kidney disease with stage 1 through stage 4 chronic kidney disease, or unspecified chronic kidney disease: Secondary | ICD-10-CM | POA: Diagnosis not present

## 2014-10-28 ENCOUNTER — Other Ambulatory Visit: Payer: Self-pay | Admitting: Endocrinology

## 2014-11-01 DIAGNOSIS — Z23 Encounter for immunization: Secondary | ICD-10-CM | POA: Diagnosis not present

## 2014-11-02 DIAGNOSIS — M47817 Spondylosis without myelopathy or radiculopathy, lumbosacral region: Secondary | ICD-10-CM | POA: Diagnosis not present

## 2014-11-09 ENCOUNTER — Other Ambulatory Visit: Payer: Self-pay | Admitting: Endocrinology

## 2014-11-12 ENCOUNTER — Other Ambulatory Visit: Payer: Self-pay | Admitting: Endocrinology

## 2014-11-14 DIAGNOSIS — D2272 Melanocytic nevi of left lower limb, including hip: Secondary | ICD-10-CM | POA: Diagnosis not present

## 2014-11-14 DIAGNOSIS — L821 Other seborrheic keratosis: Secondary | ICD-10-CM | POA: Diagnosis not present

## 2014-11-14 DIAGNOSIS — D2262 Melanocytic nevi of left upper limb, including shoulder: Secondary | ICD-10-CM | POA: Diagnosis not present

## 2014-11-14 DIAGNOSIS — L820 Inflamed seborrheic keratosis: Secondary | ICD-10-CM | POA: Diagnosis not present

## 2014-11-14 DIAGNOSIS — D225 Melanocytic nevi of trunk: Secondary | ICD-10-CM | POA: Diagnosis not present

## 2014-11-14 DIAGNOSIS — L538 Other specified erythematous conditions: Secondary | ICD-10-CM | POA: Diagnosis not present

## 2014-11-16 DIAGNOSIS — M47816 Spondylosis without myelopathy or radiculopathy, lumbar region: Secondary | ICD-10-CM | POA: Diagnosis not present

## 2014-11-16 DIAGNOSIS — M961 Postlaminectomy syndrome, not elsewhere classified: Secondary | ICD-10-CM | POA: Diagnosis not present

## 2014-11-30 ENCOUNTER — Ambulatory Visit (INDEPENDENT_AMBULATORY_CARE_PROVIDER_SITE_OTHER): Payer: Medicare Other | Admitting: Endocrinology

## 2014-11-30 ENCOUNTER — Encounter: Payer: Self-pay | Admitting: Endocrinology

## 2014-11-30 ENCOUNTER — Telehealth: Payer: Self-pay | Admitting: Endocrinology

## 2014-11-30 VITALS — BP 122/80 | HR 53 | Temp 98.2°F | Ht 66.0 in | Wt 281.0 lb

## 2014-11-30 DIAGNOSIS — IMO0002 Reserved for concepts with insufficient information to code with codable children: Secondary | ICD-10-CM

## 2014-11-30 DIAGNOSIS — N289 Disorder of kidney and ureter, unspecified: Secondary | ICD-10-CM | POA: Diagnosis not present

## 2014-11-30 DIAGNOSIS — M109 Gout, unspecified: Secondary | ICD-10-CM

## 2014-11-30 DIAGNOSIS — E1065 Type 1 diabetes mellitus with hyperglycemia: Principal | ICD-10-CM

## 2014-11-30 DIAGNOSIS — E1040 Type 1 diabetes mellitus with diabetic neuropathy, unspecified: Secondary | ICD-10-CM | POA: Diagnosis not present

## 2014-11-30 LAB — POCT GLYCOSYLATED HEMOGLOBIN (HGB A1C): Hemoglobin A1C: 7.9

## 2014-11-30 MED ORDER — INSULIN LISPRO 100 UNIT/ML ~~LOC~~ SOLN: SUBCUTANEOUS | Status: DC

## 2014-11-30 MED ORDER — INSULIN NPH (HUMAN) (ISOPHANE) 100 UNIT/ML ~~LOC~~ SUSP: SUBCUTANEOUS | Status: DC

## 2014-11-30 NOTE — Telephone Encounter (Signed)
See note below and please advise, Thanks! 

## 2014-11-30 NOTE — Telephone Encounter (Signed)
Patient stated that his gout is flared up  And is having a lot of pain in his rt foot, please advise on what to do

## 2014-11-30 NOTE — Telephone Encounter (Signed)
Ov today.

## 2014-11-30 NOTE — Patient Instructions (Addendum)
You will get better much faster if you elevate your foot above the rest of your body.  This is the best available option, given your other health problems.   check your blood sugar twice a day.  vary the time of day when you check, between before the 3 meals, and at bedtime.  also check if you have symptoms of your blood sugar being too high or too low.  please keep a record of the readings and bring it to your next appointment here.  You can write it on any piece of paper.  please call us sooner if your blood sugar goes below 70, or if you have a lot of readings over 200.  Please increase the supper insulin to 260 units.        Gout Gout is an inflammatory arthritis caused by a buildup of uric acid crystals in the joints. Uric acid is a chemical that is normally present in the blood. When the level of uric acid in the blood is too high it can form crystals that deposit in your joints and tissues. This causes joint redness, soreness, and swelling (inflammation). Repeat attacks are common. Over time, uric acid crystals can form into masses (tophi) near a joint, destroying bone and causing disfigurement. Gout is treatable and often preventable. CAUSES  The disease begins with elevated levels of uric acid in the blood. Uric acid is produced by your body when it breaks down a naturally found substance called purines. Certain foods you eat, such as meats and fish, contain high amounts of purines. Causes of an elevated uric acid level include:  Being passed down from parent to child (heredity).  Diseases that cause increased uric acid production (such as obesity, psoriasis, and certain cancers).  Excessive alcohol use.  Diet, especially diets rich in meat and seafood.  Medicines, including certain cancer-fighting medicines (chemotherapy), water pills (diuretics), and aspirin.  Chronic kidney disease. The kidneys are no longer able to remove uric acid well.  Problems with metabolism. Conditions  strongly associated with gout include:  Obesity.  High blood pressure.  High cholesterol.  Diabetes. Not everyone with elevated uric acid levels gets gout. It is not understood why some people get gout and others do not. Surgery, joint injury, and eating too much of certain foods are some of the factors that can lead to gout attacks. SYMPTOMS   An attack of gout comes on quickly. It causes intense pain with redness, swelling, and warmth in a joint.  Fever can occur.  Often, only one joint is involved. Certain joints are more commonly involved:  Base of the big toe.  Knee.  Ankle.  Wrist.  Finger. Without treatment, an attack usually goes away in a few days to weeks. Between attacks, you usually will not have symptoms, which is different from many other forms of arthritis. DIAGNOSIS  Your caregiver will suspect gout based on your symptoms and exam. In some cases, tests may be recommended. The tests may include:  Blood tests.  Urine tests.  X-rays.  Joint fluid exam. This exam requires a needle to remove fluid from the joint (arthrocentesis). Using a microscope, gout is confirmed when uric acid crystals are seen in the joint fluid. TREATMENT  There are two phases to gout treatment: treating the sudden onset (acute) attack and preventing attacks (prophylaxis).  Treatment of an Acute Attack.  Medicines are used. These include anti-inflammatory medicines or steroid medicines.  An injection of steroid medicine into the affected joint is  sometimes necessary.  The painful joint is rested. Movement can worsen the arthritis.  You may use warm or cold treatments on painful joints, depending which works best for you.  Treatment to Prevent Attacks.  If you suffer from frequent gout attacks, your caregiver may advise preventive medicine. These medicines are started after the acute attack subsides. These medicines either help your kidneys eliminate uric acid from your body or  decrease your uric acid production. You may need to stay on these medicines for a very long time.  The early phase of treatment with preventive medicine can be associated with an increase in acute gout attacks. For this reason, during the first few months of treatment, your caregiver may also advise you to take medicines usually used for acute gout treatment. Be sure you understand your caregiver's directions. Your caregiver may make several adjustments to your medicine dose before these medicines are effective.  Discuss dietary treatment with your caregiver or dietitian. Alcohol and drinks high in sugar and fructose and foods such as meat, poultry, and seafood can increase uric acid levels. Your caregiver or dietitian can advise you on drinks and foods that should be limited. HOME CARE INSTRUCTIONS   Do not take aspirin to relieve pain. This raises uric acid levels.  Only take over-the-counter or prescription medicines for pain, discomfort, or fever as directed by your caregiver.  Rest the joint as much as possible. When in bed, keep sheets and blankets off painful areas.  Keep the affected joint raised (elevated).  Apply warm or cold treatments to painful joints. Use of warm or cold treatments depends on which works best for you.  Use crutches if the painful joint is in your leg.  Drink enough fluids to keep your urine clear or pale yellow. This helps your body get rid of uric acid. Limit alcohol, sugary drinks, and fructose drinks.  Follow your dietary instructions. Pay careful attention to the amount of protein you eat. Your daily diet should emphasize fruits, vegetables, whole grains, and fat-free or low-fat milk products. Discuss the use of coffee, vitamin C, and cherries with your caregiver or dietitian. These may be helpful in lowering uric acid levels.  Maintain a healthy body weight. SEEK MEDICAL CARE IF:   You develop diarrhea, vomiting, or any side effects from medicines.  You  do not feel better in 24 hours, or you are getting worse. SEEK IMMEDIATE MEDICAL CARE IF:   Your joint becomes suddenly more tender, and you have chills or a fever. MAKE SURE YOU:   Understand these instructions.  Will watch your condition.  Will get help right away if you are not doing well or get worse.   This information is not intended to replace advice given to you by your health care provider. Make sure you discuss any questions you have with your health care provider.   Document Released: 12/29/1999 Document Revised: 01/21/2014 Document Reviewed: 08/14/2011 Elsevier Interactive Patient Education [employer].

## 2014-11-30 NOTE — Progress Notes (Signed)
Subjective:    Patient ID: Donald [name], male    DOB: [birth year], 69 y.o.   MRN: [place]:[id number]  HPI Pt returns for f/u of diabetes mellitus: DM type: 1 Dx'ed: [id number] Complications: polyneuropathy, CAD and nephropathy Therapy: insulin since 1995 DKA: never.  Severe hypoglycemia: never.  Pancreatitis: never Other: he takes multiple daily injections; he declines weight-loss surgery; he has severe insulin resistance. Interval history: no recent steroids.  no cbg record, but states cbg's are highest at hs.   Pt states 1 day of severe pain at the right foot.  No injury.  This is where he has had gout in the past.  He took 6 colchicine, without relief. Past Medical History  Diagnosis Date  . DIABETES MELLITUS, TYPE II 07/26/2006  . DYSLIPIDEMIA 04/13/2009  . ANEMIA-IRON DEFICIENCY 07/26/2006  . Pernicious anemia 11/20/2006  . DEPRESSION 03/14/2009  . PERIPHERAL NEUROPATHY 07/26/2006  . HYPERTENSION 07/26/2006  . PULMONARY EMBOLISM 10/05/2007  . D V T 12/03/2007  . ASTHMA 07/26/2006  . GERD 07/26/2006  . [place] DISEASE, LUMBAR 10/05/2007  . INSOMNIA 08/21/2007  . OBSTRUCTIVE SLEEP APNEA 12/03/2007  . COLONIC POLYPS, HX OF 07/26/2006  . Shortness of breath dyspnea   . Heart murmur   . Neuropathy ([place])   . Bell's palsy   . Chronic kidney disease     stage 3 renal failure  . Gout   . Back pain, chronic   . Edema     ankle    Past Surgical History  Procedure Laterality Date  . Appendectomy  1968  . Back surgery  1977, 06/14/2008    Dr. Trenton Gammon at [facility] (06/10)  . Cardiac catheterization    . Tonsillectomy    . Carpal tunnel release    . Shoulder arthroscopy w/ rotator cuff repair    . Lithotripsy    . Cataract extraction w/phaco Left 05/30/2014    Procedure: CATARACT EXTRACTION PHACO AND INTRAOCULAR LENS PLACEMENT (IOC);  Surgeon: Estill Cotta, MD;  Location: [facility] ORS;  Service: Ophthalmology;  Laterality: Left;  [name] 01:20 AP% 23.7 CDE 31.86    Social History   Social  History  . Marital Status: Married    Spouse Name: N/A  . Number of Children: N/A  . Years of Education: N/A   Occupational History  . Disabled    Social History Main Topics  . Smoking status: Never Smoker   . Smokeless tobacco: Never Used  . Alcohol Use: No  . Drug Use: No  . Sexual Activity: Not on file   Other Topics Concern  . 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   Activity is severely limited by medical problems   Diet is "good".   Never a smoker   Alcohol: none          Current Outpatient Prescriptions on File Prior to Visit  Medication Sig Dispense Refill  . ALPRAZolam (XANAX) 1 MG tablet TAKE ONE TABLET AT BEDTIME AS NEEDED FORSLEEP 30 tablet 5  . aspirin 81 MG chewable tablet Chew 81 mg by mouth daily.    Marland Kitchen COLCRYS 0.6 MG tablet 1 CAPSULE EVERY HOUR UNTIL GOUT IS BETTER OR DIARRHEA DEVELOPS OR YOUREACHA MAX OF 6 PER DAY 30 tablet 11  . cyanocobalamin (,VITAMIN B-12,) 1000 MCG/ML injection 1 injection every 3 months     . furosemide (LASIX) 80 MG tablet TAKE ONE TABLET EVERY DAY 30  tablet 2  . gabapentin (NEURONTIN) 600 MG tablet Take 1 tablet (600 mg total) by mouth 3 (three) times daily. (Patient taking differently: Take 600 mg by mouth 2 (two) times daily. ) 60 tablet 11  . glucose blood (ONETOUCH VERIO) test strip 1 each by Other route 2 (two) times daily. And lancets 2/day 100 each 12  . HYDROcodone-acetaminophen (NORCO/VICODIN) 5-325 MG per tablet Take 1 tablet by mouth every 6 (six) hours as needed for moderate pain.    Marland Kitchen insulin lispro (HUMALOG) 100 UNIT/ML injection Inject into the skin 3 (three) times daily before meals. 180units breakfast 150 units with lunch and 260 units with the evening meal    . insulin NPH Human (HUMULIN N,NOVOLIN N) 100 UNIT/ML injection 260 Units at bedtime.     Marland Kitchen losartan (COZAAR) 100 MG tablet Take 1 tablet by mouth at bedtime 30 tablet 4  . lovastatin  (MEVACOR) 40 MG tablet TAKE TWO TABLETS AT BEDTIME 60 tablet 2  . Sennosides-Docusate Sodium (STOOL SOFTENER & LAXATIVE PO) Take 2 tablets by mouth at bedtime.    . sildenafil (REVATIO) 20 MG tablet 1-5 pills, as needed 90 tablet 11  . VENTOLIN HFA 108 (90 BASE) MCG/ACT inhaler 2 puffs every 4-6 hours as needed for wheezing/shortness of breath     No current facility-administered medications on file prior to visit.    Allergies  Allergen Reactions  . Codeine     REACTION: chest pain  . Niacin-Lovastatin Er   . Pioglitazone     REACTION to Actos: swelling in ankles    Family History  Problem Relation Age of Onset  . Cancer Mother     Breast Cancer  . Cancer Sister     Breast Cancer    BP 122/80 mmHg  Pulse 53  Temp(Src) 98.2 F (36.8 C) (Oral)  Ht 5\' 6"  (1.676 m)  Wt 281 lb (127.461 kg)  BMI 45.38 kg/m2  SpO2 97%  Review of Systems He denies hypoglycemia and fever.      Objective:   Physical Exam VITAL SIGNS: See vs page GENERAL: no distress Pulses: dorsalis pedis intact bilat.  Feet: no deformity. feet are of normal color and temp. 1+ bilat leg edema. No warmth/tend/erythema. Skin: no ulcer on the feet.  Neuro: sensation is intact to touch on the feet, but decreased from normal.    Lab Results  Component Value Date   HGBA1C 7.9 11/30/2014      Assessment & Plan:  Acute gouty attack, recurrent Renal insuff.  This precludes NSAID rx of gout DM: he needs increased rx.   Patient is advised the following: Patient Instructions  You will get better much faster if you elevate your foot above the rest of your body.  This is the best available option, given your other health problems.   check your blood sugar twice a day.  vary the time of day when you check, between before the 3 meals, and at bedtime.  also check if you have symptoms of your blood sugar being too high or too low.  please keep a record of the readings and bring it to your next appointment here.   You can write it on any piece of paper.  please call us sooner if your blood sugar goes below 70, or if you have a lot of readings over 200.  Please increase the supper insulin to 260 units.        Gout Gout is an inflammatory arthritis caused by a  buildup of uric acid crystals in the joints. Uric acid is a chemical that is normally present in the blood. When the level of uric acid in the blood is too high it can form crystals that deposit in your joints and tissues. This causes joint redness, soreness, and swelling (inflammation). Repeat attacks are common. Over time, uric acid crystals can form into masses (tophi) near a joint, destroying bone and causing disfigurement. Gout is treatable and often preventable. CAUSES  The disease begins with elevated levels of uric acid in the blood. Uric acid is produced by your body when it breaks down a naturally found substance called purines. Certain foods you eat, such as meats and fish, contain high amounts of purines. Causes of an elevated uric acid level include:  Being passed down from parent to child (heredity).  Diseases that cause increased uric acid production (such as obesity, psoriasis, and certain cancers).  Excessive alcohol use.  Diet, especially diets rich in meat and seafood.  Medicines, including certain cancer-fighting medicines (chemotherapy), water pills (diuretics), and aspirin.  Chronic kidney disease. The kidneys are no longer able to remove uric acid well.  Problems with metabolism. Conditions strongly associated with gout include:  Obesity.  High blood pressure.  High cholesterol.  Diabetes. Not everyone with elevated uric acid levels gets gout. It is not understood why some people get gout and others do not. Surgery, joint injury, and eating too much of certain foods are some of the factors that can lead to gout attacks. SYMPTOMS   An attack of gout comes on quickly. It causes intense pain with redness, swelling,  and warmth in a joint.  Fever can occur.  Often, only one joint is involved. Certain joints are more commonly involved:  Base of the big toe.  Knee.  Ankle.  Wrist.  Finger. Without treatment, an attack usually goes away in a few days to weeks. Between attacks, you usually will not have symptoms, which is different from many other forms of arthritis. DIAGNOSIS  Your caregiver will suspect gout based on your symptoms and exam. In some cases, tests may be recommended. The tests may include:  Blood tests.  Urine tests.  X-rays.  Joint fluid exam. This exam requires a needle to remove fluid from the joint (arthrocentesis). Using a microscope, gout is confirmed when uric acid crystals are seen in the joint fluid. TREATMENT  There are two phases to gout treatment: treating the sudden onset (acute) attack and preventing attacks (prophylaxis).  Treatment of an Acute Attack.  Medicines are used. These include anti-inflammatory medicines or steroid medicines.  An injection of steroid medicine into the affected joint is sometimes necessary.  The painful joint is rested. Movement can worsen the arthritis.  You may use warm or cold treatments on painful joints, depending which works best for you.  Treatment to Prevent Attacks.  If you suffer from frequent gout attacks, your caregiver may advise preventive medicine. These medicines are started after the acute attack subsides. These medicines either help your kidneys eliminate uric acid from your body or decrease your uric acid production. You may need to stay on these medicines for a very long time.  The early phase of treatment with preventive medicine can be associated with an increase in acute gout attacks. For this reason, during the first few months of treatment, your caregiver may also advise you to take medicines usually used for acute gout treatment. Be sure you understand your caregiver's directions. Your caregiver may make  several adjustments to your medicine dose before these medicines are effective.  Discuss dietary treatment with your caregiver or dietitian. Alcohol and drinks high in sugar and fructose and foods such as meat, poultry, and seafood can increase uric acid levels. Your caregiver or dietitian can advise you on drinks and foods that should be limited. HOME CARE INSTRUCTIONS   Do not take aspirin to relieve pain. This raises uric acid levels.  Only take over-the-counter or prescription medicines for pain, discomfort, or fever as directed by your caregiver.  Rest the joint as much as possible. When in bed, keep sheets and blankets off painful areas.  Keep the affected joint raised (elevated).  Apply warm or cold treatments to painful joints. Use of warm or cold treatments depends on which works best for you.  Use crutches if the painful joint is in your leg.  Drink enough fluids to keep your urine clear or pale yellow. This helps your body get rid of uric acid. Limit alcohol, sugary drinks, and fructose drinks.  Follow your dietary instructions. Pay careful attention to the amount of protein you eat. Your daily diet should emphasize fruits, vegetables, whole grains, and fat-free or low-fat milk products. Discuss the use of coffee, vitamin C, and cherries with your caregiver or dietitian. These may be helpful in lowering uric acid levels.  Maintain a healthy body weight. SEEK MEDICAL CARE IF:   You develop diarrhea, vomiting, or any side effects from medicines.  You do not feel better in 24 hours, or you are getting worse. SEEK IMMEDIATE MEDICAL CARE IF:   Your joint becomes suddenly more tender, and you have chills or a fever. MAKE SURE YOU:   Understand these instructions.  Will watch your condition.  Will get help right away if you are not doing well or get worse.   This information is not intended to replace advice given to you by your health care provider. Make sure you discuss  any questions you have with your health care provider.   Document Released: 12/29/1999 Document Revised: 01/21/2014 Document Reviewed: 08/14/2011 Elsevier Interactive Patient Education [employer].

## 2014-11-30 NOTE — Telephone Encounter (Signed)
I contacted the pt and advised of note below. Pt will come today at 3pm.

## 2014-12-09 ENCOUNTER — Other Ambulatory Visit: Payer: Self-pay | Admitting: Endocrinology

## 2014-12-13 DIAGNOSIS — M961 Postlaminectomy syndrome, not elsewhere classified: Secondary | ICD-10-CM | POA: Diagnosis not present

## 2014-12-13 DIAGNOSIS — M5416 Radiculopathy, lumbar region: Secondary | ICD-10-CM | POA: Diagnosis not present

## 2014-12-14 ENCOUNTER — Ambulatory Visit: Payer: Medicare Other | Admitting: Endocrinology

## 2014-12-20 ENCOUNTER — Other Ambulatory Visit: Payer: Self-pay | Admitting: Endocrinology

## 2015-01-03 ENCOUNTER — Other Ambulatory Visit: Payer: Self-pay | Admitting: Endocrinology

## 2015-01-11 ENCOUNTER — Other Ambulatory Visit: Payer: Self-pay | Admitting: Endocrinology

## 2015-02-02 ENCOUNTER — Other Ambulatory Visit: Payer: Self-pay | Admitting: Endocrinology

## 2015-02-21 DIAGNOSIS — E113293 Type 2 diabetes mellitus with mild nonproliferative diabetic retinopathy without macular edema, bilateral: Secondary | ICD-10-CM | POA: Diagnosis not present

## 2015-02-21 LAB — HM DIABETES EYE EXAM

## 2015-02-24 ENCOUNTER — Encounter: Payer: Self-pay | Admitting: Endocrinology

## 2015-03-02 ENCOUNTER — Encounter: Payer: Self-pay | Admitting: Endocrinology

## 2015-03-02 ENCOUNTER — Ambulatory Visit (INDEPENDENT_AMBULATORY_CARE_PROVIDER_SITE_OTHER): Payer: Medicare Other | Admitting: Endocrinology

## 2015-03-02 VITALS — BP 128/64 | HR 63 | Temp 97.8°F | Ht 66.0 in | Wt 278.0 lb

## 2015-03-02 DIAGNOSIS — IMO0002 Reserved for concepts with insufficient information to code with codable children: Secondary | ICD-10-CM

## 2015-03-02 DIAGNOSIS — E1040 Type 1 diabetes mellitus with diabetic neuropathy, unspecified: Secondary | ICD-10-CM | POA: Diagnosis not present

## 2015-03-02 DIAGNOSIS — E1065 Type 1 diabetes mellitus with hyperglycemia: Secondary | ICD-10-CM | POA: Diagnosis not present

## 2015-03-02 DIAGNOSIS — J069 Acute upper respiratory infection, unspecified: Secondary | ICD-10-CM

## 2015-03-02 DIAGNOSIS — D509 Iron deficiency anemia, unspecified: Secondary | ICD-10-CM

## 2015-03-02 DIAGNOSIS — N183 Chronic kidney disease, stage 3 (moderate): Secondary | ICD-10-CM

## 2015-03-02 DIAGNOSIS — R809 Proteinuria, unspecified: Secondary | ICD-10-CM

## 2015-03-02 DIAGNOSIS — Z125 Encounter for screening for malignant neoplasm of prostate: Secondary | ICD-10-CM

## 2015-03-02 DIAGNOSIS — N289 Disorder of kidney and ureter, unspecified: Secondary | ICD-10-CM

## 2015-03-02 DIAGNOSIS — E785 Hyperlipidemia, unspecified: Secondary | ICD-10-CM | POA: Diagnosis not present

## 2015-03-02 DIAGNOSIS — I10 Essential (primary) hypertension: Secondary | ICD-10-CM | POA: Diagnosis not present

## 2015-03-02 DIAGNOSIS — E1122 Type 2 diabetes mellitus with diabetic chronic kidney disease: Secondary | ICD-10-CM

## 2015-03-02 DIAGNOSIS — Z794 Long term (current) use of insulin: Secondary | ICD-10-CM

## 2015-03-02 LAB — CBC WITH DIFFERENTIAL/PLATELET
Basophils Absolute: 0 10*3/uL (ref 0.0–0.1)
Basophils Relative: 0.5 % (ref 0.0–3.0)
EOS PCT: 3.9 % (ref 0.0–5.0)
Eosinophils Absolute: 0.2 10*3/uL (ref 0.0–0.7)
HEMATOCRIT: 38.7 % — AB (ref 39.0–52.0)
HEMOGLOBIN: 13.1 g/dL (ref 13.0–17.0)
LYMPHS ABS: 1.4 10*3/uL (ref 0.7–4.0)
LYMPHS PCT: 21.5 % (ref 12.0–46.0)
MCHC: 33.7 g/dL (ref 30.0–36.0)
MCV: 84.4 fl (ref 78.0–100.0)
MONOS PCT: 6.9 % (ref 3.0–12.0)
Monocytes Absolute: 0.4 10*3/uL (ref 0.1–1.0)
Neutro Abs: 4.3 10*3/uL (ref 1.4–7.7)
Neutrophils Relative %: 67.2 % (ref 43.0–77.0)
Platelets: 190 10*3/uL (ref 150.0–400.0)
RBC: 4.59 Mil/uL (ref 4.22–5.81)
RDW: 15 % (ref 11.5–15.5)
WBC: 6.4 10*3/uL (ref 4.0–10.5)

## 2015-03-02 LAB — BASIC METABOLIC PANEL
BUN: 31 mg/dL — AB (ref 6–23)
CHLORIDE: 104 meq/L (ref 96–112)
CO2: 30 mEq/L (ref 19–32)
CREATININE: 1.81 mg/dL — AB (ref 0.40–1.50)
Calcium: 9.8 mg/dL (ref 8.4–10.5)
GFR: 39.92 mL/min — AB (ref >60.00)
GLUCOSE: 125 mg/dL — AB (ref 70–99)
Potassium: 3.7 mEq/L (ref 3.5–5.1)
Sodium: 143 mEq/L (ref 135–145)

## 2015-03-02 LAB — URINALYSIS, ROUTINE W REFLEX MICROSCOPIC
BILIRUBIN URINE: NEGATIVE
KETONES UR: NEGATIVE
LEUKOCYTES UA: NEGATIVE
NITRITE: NEGATIVE
Specific Gravity, Urine: 1.02 (ref 1.000–1.030)
URINE GLUCOSE: NEGATIVE
Urobilinogen, UA: 0.2 (ref 0.0–1.0)
pH: 5.5 (ref 5.0–8.0)

## 2015-03-02 LAB — URIC ACID: URIC ACID, SERUM: 10.4 mg/dL — AB (ref 4.0–7.8)

## 2015-03-02 LAB — IBC PANEL
Iron: 74 ug/dL (ref 42–165)
SATURATION RATIOS: 20.4 % (ref 20.0–50.0)
Transferrin: 259 mg/dL (ref 212.0–360.0)

## 2015-03-02 LAB — HEPATIC FUNCTION PANEL
ALK PHOS: 78 U/L (ref 39–117)
ALT: 22 U/L (ref 0–53)
AST: 17 U/L (ref 0–37)
Albumin: 4.4 g/dL (ref 3.5–5.2)
BILIRUBIN DIRECT: 0.2 mg/dL (ref 0.0–0.3)
BILIRUBIN TOTAL: 1.1 mg/dL (ref 0.2–1.2)
TOTAL PROTEIN: 6.8 g/dL (ref 6.0–8.3)

## 2015-03-02 LAB — MICROALBUMIN / CREATININE URINE RATIO
CREATININE, U: 177.3 mg/dL
MICROALB UR: 12 mg/dL — AB (ref 0.0–1.9)
Microalb Creat Ratio: 6.8 mg/g (ref 0.0–30.0)

## 2015-03-02 LAB — TSH: TSH: 3.11 u[IU]/mL (ref 0.35–4.50)

## 2015-03-02 LAB — PSA, MEDICARE: PSA: 0.66 ng/ml (ref 0.10–4.00)

## 2015-03-02 LAB — GLUCOSE, POCT (MANUAL RESULT ENTRY): POC Glucose: 6.7 mg/dl — AB (ref 70–99)

## 2015-03-02 MED ORDER — ALLOPURINOL 300 MG PO TABS: 300.0000 mg | ORAL_TABLET | Freq: Every day | ORAL | Status: DC

## 2015-03-02 MED ORDER — CYANOCOBALAMIN 1000 MCG/ML IJ SOLN
1000.0000 ug | Freq: Once | INTRAMUSCULAR | Status: AC
  Administered 2015-03-02: 1000 ug via INTRAMUSCULAR

## 2015-03-02 MED ORDER — INSULIN NPH (HUMAN) (ISOPHANE) 100 UNIT/ML ~~LOC~~ SUSP: 280.0000 [IU] | Freq: Every day | SUBCUTANEOUS | Status: DC

## 2015-03-02 NOTE — Patient Instructions (Addendum)
check your blood sugar twice a day.  vary the time of day when you check, between before the 3 meals, and at bedtime.  also check if you have symptoms of your blood sugar being too high or too low.  please keep a record of the readings and bring it to your next appointment here.  You can write it on any piece of paper.  please call us sooner if your blood sugar goes below 70, or if you have a lot of readings over 200.  Please come back for a follow-up appointment in 3 months.  blood tests are requested for you today.  We'll let you know about the results. i have sent a prescription to your pharmacy, to keep the gout away.    Please reduce the bedtime insulin to 280 units.

## 2015-03-02 NOTE — Progress Notes (Signed)
Subjective:    Patient ID: Donald [name], male    DOB: 13-Jan-1946, 70 y.o.   MRN: [id number]  HPI Pt returns for f/u of diabetes mellitus: DM type: Insulin-requiring type 2 Dx'ed: 123XX123 Complications: polyneuropathy, CAD and renal insufficiency Therapy: insulin since 1995 DKA: never.  Severe hypoglycemia: never.  Pancreatitis: never Other: he takes multiple daily injections; he declines weight-loss surgery; he has severe insulin resistance. Interval history: he brings a scant record of his cbg's which i have reviewed today (checked fasting and in the afternoon).  It is in general higher as the day goes on, but not necessarily so.   Past Medical History  Diagnosis Date  . DIABETES MELLITUS, TYPE II 07/26/2006  . DYSLIPIDEMIA 04/13/2009  . ANEMIA-IRON DEFICIENCY 07/26/2006  . Pernicious anemia 11/20/2006  . DEPRESSION 03/14/2009  . PERIPHERAL NEUROPATHY 07/26/2006  . HYPERTENSION 07/26/2006  . PULMONARY EMBOLISM 10/05/2007  . D V T 12/03/2007  . ASTHMA 07/26/2006  . GERD 07/26/2006  . [place] DISEASE, LUMBAR 10/05/2007  . INSOMNIA 08/21/2007  . OBSTRUCTIVE SLEEP APNEA 12/03/2007  . COLONIC POLYPS, HX OF 07/26/2006  . Shortness of breath dyspnea   . Heart murmur   . Neuropathy ([place])   . Bell's palsy   . Chronic kidney disease     stage 3 renal failure  . Gout   . Back pain, chronic   . Edema     ankle    Past Surgical History  Procedure Laterality Date  . Appendectomy  1968  . Back surgery  1977, 06/14/2008    Dr. Trenton Gammon at [facility] (06/10)  . Cardiac catheterization    . Tonsillectomy    . Carpal tunnel release    . Shoulder arthroscopy w/ rotator cuff repair    . Lithotripsy    . Cataract extraction w/phaco Left 05/30/2014    Procedure: CATARACT EXTRACTION PHACO AND INTRAOCULAR LENS PLACEMENT (IOC);  Surgeon: Estill Cotta, MD;  Location: [facility] ORS;  Service: Ophthalmology;  Laterality: Left;  [name] 01:20 AP% 23.7 CDE 31.86    Social History   Social History  .  Marital Status: Married    Spouse Name: N/A  . Number of Children: N/A  . Years of Education: N/A   Occupational History  . Disabled    Social History Main Topics  . Smoking status: Never Smoker   . Smokeless tobacco: Never Used  . Alcohol Use: No  . Drug Use: No  . Sexual Activity: Not on file   Other Topics Concern  . 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   Activity is severely limited by medical problems   Diet is "good".   Never a smoker   Alcohol: none          Current Outpatient Prescriptions on File Prior to Visit  Medication Sig Dispense Refill  . ALPRAZolam (XANAX) 1 MG tablet TAKE ONE TABLET AT BEDTIME AS NEEDED FORSLEEP 30 tablet 0  . aspirin 81 MG chewable tablet Chew 81 mg by mouth daily.    Marland Kitchen COLCRYS 0.6 MG tablet 1 CAPSULE EVERY HOUR UNTIL GOUT IS BETTER OR DIARRHEA DEVELOPS OR YOUREACHA MAX OF 6 PER DAY 30 tablet 11  . cyanocobalamin (,VITAMIN B-12,) 1000 MCG/ML injection 1 injection every 3 months     . furosemide (LASIX) 80 MG tablet TAKE ONE TABLET EVERY DAY 30 tablet 2  . gabapentin (NEURONTIN) 600 MG tablet  TAKE ONE TABLET BY MOUTH 3 TIMES DAILY 60 tablet 1  . glucose blood (ONETOUCH VERIO) test strip 1 each by Other route 2 (two) times daily. And lancets 2/day 100 each 12  . HYDROcodone-acetaminophen (NORCO/VICODIN) 5-325 MG per tablet Take 1 tablet by mouth every 6 (six) hours as needed for moderate pain.    Marland Kitchen insulin lispro (HUMALOG) 100 UNIT/ML injection Inject into the skin 3 (three) times daily before meals. 180units breakfast 150 units with lunch and 260 units with the evening meal    . losartan (COZAAR) 100 MG tablet TAKE ONE TABLET AT BEDTIME 30 tablet 2  . lovastatin (MEVACOR) 40 MG tablet TAKE TWO TABLETS AT BEDTIME 60 tablet 2  . Sennosides-Docusate Sodium (STOOL SOFTENER & LAXATIVE PO) Take 2 tablets by mouth at bedtime.    . sildenafil (REVATIO) 20 MG tablet 1-5  pills, as needed 90 tablet 11  . VENTOLIN HFA 108 (90 BASE) MCG/ACT inhaler 2 puffs every 4-6 hours as needed for wheezing/shortness of breath     No current facility-administered medications on file prior to visit.    Allergies  Allergen Reactions  . Codeine     REACTION: chest pain  . Niacin-Lovastatin Er   . Pioglitazone     REACTION to Actos: swelling in ankles    Family History  Problem Relation Age of Onset  . Cancer Mother     Breast Cancer  . Cancer Sister     Breast Cancer    BP 128/64 mmHg  Pulse 63  Temp(Src) 97.8 F (36.6 C) (Oral)  Ht 5\' 6"  (1.676 m)  Wt 278 lb (126.1 kg)  BMI 44.89 kg/m2  SpO2 95%  Review of Systems He had another episode of gout last week.      Objective:   Physical Exam VITAL SIGNS: See vs page.  GENERAL: no distress. Morbid obesity. Pulses: dorsalis pedis intact bilat.  Feet: no deformity. feet are of normal color and temp.  CV: 1+ bilat leg edema.  Skin: no ulcer on the feet.  Neuro: sensation is intact to touch on the feet, but decreased from normal.    A1c=6.7%    Assessment & Plan:  DM: slightly overcontrolled Gout: recurrent Hematuria, recurrent: pt is asked: There is a tiny amount of blood in the urine. Didn't you see a urologist for this in the past. If so, where and when?  Renal failure: stable.  We'll continue to follow.  Patient is advised the following: Patient Instructions  check your blood sugar twice a day.  vary the time of day when you check, between before the 3 meals, and at bedtime.  also check if you have symptoms of your blood sugar being too high or too low.  please keep a record of the readings and bring it to your next appointment here.  You can write it on any piece of paper.  please call us sooner if your blood sugar goes below 70, or if you have a lot of readings over 200.  Please come back for a follow-up appointment in 3 months.  blood tests are requested for you today.  We'll let you know  about the results. i have sent a prescription to your pharmacy, to keep the gout away.    Please reduce the bedtime insulin to 280 units.

## 2015-03-15 ENCOUNTER — Other Ambulatory Visit: Payer: Self-pay | Admitting: Endocrinology

## 2015-03-15 DIAGNOSIS — N183 Chronic kidney disease, stage 3 (moderate): Secondary | ICD-10-CM | POA: Diagnosis not present

## 2015-03-15 DIAGNOSIS — D509 Iron deficiency anemia, unspecified: Secondary | ICD-10-CM | POA: Diagnosis not present

## 2015-03-15 DIAGNOSIS — Z6841 Body Mass Index (BMI) 40.0 and over, adult: Secondary | ICD-10-CM | POA: Diagnosis not present

## 2015-03-15 DIAGNOSIS — Z9989 Dependence on other enabling machines and devices: Secondary | ICD-10-CM | POA: Diagnosis not present

## 2015-03-15 DIAGNOSIS — G4733 Obstructive sleep apnea (adult) (pediatric): Secondary | ICD-10-CM | POA: Diagnosis not present

## 2015-03-15 DIAGNOSIS — I129 Hypertensive chronic kidney disease with stage 1 through stage 4 chronic kidney disease, or unspecified chronic kidney disease: Secondary | ICD-10-CM | POA: Diagnosis not present

## 2015-03-15 DIAGNOSIS — N2581 Secondary hyperparathyroidism of renal origin: Secondary | ICD-10-CM | POA: Diagnosis not present

## 2015-03-15 DIAGNOSIS — E1122 Type 2 diabetes mellitus with diabetic chronic kidney disease: Secondary | ICD-10-CM | POA: Diagnosis not present

## 2015-03-15 DIAGNOSIS — N200 Calculus of kidney: Secondary | ICD-10-CM | POA: Diagnosis not present

## 2015-03-16 NOTE — Telephone Encounter (Signed)
Donald [name],   This is your patient

## 2015-03-20 ENCOUNTER — Other Ambulatory Visit: Payer: Self-pay | Admitting: Otolaryngology

## 2015-03-20 DIAGNOSIS — Q892 Congenital malformations of other endocrine glands: Secondary | ICD-10-CM

## 2015-03-20 DIAGNOSIS — H6983 Other specified disorders of Eustachian tube, bilateral: Secondary | ICD-10-CM | POA: Diagnosis not present

## 2015-03-20 DIAGNOSIS — H903 Sensorineural hearing loss, bilateral: Secondary | ICD-10-CM | POA: Diagnosis not present

## 2015-03-20 DIAGNOSIS — J301 Allergic rhinitis due to pollen: Secondary | ICD-10-CM | POA: Diagnosis not present

## 2015-03-20 DIAGNOSIS — R221 Localized swelling, mass and lump, neck: Secondary | ICD-10-CM

## 2015-03-20 DIAGNOSIS — H6063 Unspecified chronic otitis externa, bilateral: Secondary | ICD-10-CM | POA: Diagnosis not present

## 2015-03-20 DIAGNOSIS — H6121 Impacted cerumen, right ear: Secondary | ICD-10-CM | POA: Diagnosis not present

## 2015-03-20 DIAGNOSIS — H906 Mixed conductive and sensorineural hearing loss, bilateral: Secondary | ICD-10-CM | POA: Diagnosis not present

## 2015-03-22 ENCOUNTER — Other Ambulatory Visit: Payer: Self-pay | Admitting: Otolaryngology

## 2015-03-22 DIAGNOSIS — Q892 Congenital malformations of other endocrine glands: Secondary | ICD-10-CM

## 2015-03-22 DIAGNOSIS — R221 Localized swelling, mass and lump, neck: Secondary | ICD-10-CM

## 2015-03-23 ENCOUNTER — Ambulatory Visit
Admission: RE | Admit: 2015-03-23 | Discharge: 2015-03-23 | Disposition: A | Discharge status: Home / Self Care | Payer: Medicare Other | Source: Ambulatory Visit | Attending: Otolaryngology | Admitting: Otolaryngology

## 2015-03-23 DIAGNOSIS — Q892 Congenital malformations of other endocrine glands: Secondary | ICD-10-CM

## 2015-03-23 DIAGNOSIS — E042 Nontoxic multinodular goiter: Secondary | ICD-10-CM | POA: Diagnosis not present

## 2015-03-23 DIAGNOSIS — R221 Localized swelling, mass and lump, neck: Secondary | ICD-10-CM

## 2015-03-27 ENCOUNTER — Other Ambulatory Visit: Payer: Self-pay | Admitting: Otolaryngology

## 2015-03-27 DIAGNOSIS — Q892 Congenital malformations of other endocrine glands: Secondary | ICD-10-CM

## 2015-03-27 DIAGNOSIS — R221 Localized swelling, mass and lump, neck: Secondary | ICD-10-CM

## 2015-03-28 ENCOUNTER — Encounter: Payer: Self-pay | Admitting: Endocrinology

## 2015-04-04 DIAGNOSIS — M5136 Other intervertebral disc degeneration, lumbar region: Secondary | ICD-10-CM | POA: Diagnosis not present

## 2015-04-07 ENCOUNTER — Ambulatory Visit
Admission: RE | Admit: 2015-04-07 | Discharge: 2015-04-07 | Disposition: A | Discharge status: Home / Self Care | Payer: Medicare Other | Source: Ambulatory Visit | Attending: Otolaryngology | Admitting: Otolaryngology

## 2015-04-07 DIAGNOSIS — R221 Localized swelling, mass and lump, neck: Secondary | ICD-10-CM | POA: Diagnosis not present

## 2015-04-07 DIAGNOSIS — E041 Nontoxic single thyroid nodule: Secondary | ICD-10-CM | POA: Insufficient documentation

## 2015-04-07 DIAGNOSIS — Q892 Congenital malformations of other endocrine glands: Secondary | ICD-10-CM | POA: Diagnosis present

## 2015-04-07 HISTORY — DX: Disorder of kidney and ureter, unspecified: N28.9

## 2015-04-07 LAB — POCT I-STAT CREATININE: Creatinine, Ser: 1.6 mg/dL — ABNORMAL HIGH (ref 0.61–1.24)

## 2015-04-07 MED ORDER — IOPAMIDOL (ISOVUE-300) INJECTION 61%
60.0000 mL | Freq: Once | INTRAVENOUS | Status: AC | PRN
  Administered 2015-04-07: 60 mL via INTRAVENOUS

## 2015-04-12 ENCOUNTER — Other Ambulatory Visit: Payer: Self-pay | Admitting: Endocrinology

## 2015-04-18 DIAGNOSIS — H903 Sensorineural hearing loss, bilateral: Secondary | ICD-10-CM | POA: Diagnosis not present

## 2015-04-18 DIAGNOSIS — R221 Localized swelling, mass and lump, neck: Secondary | ICD-10-CM | POA: Diagnosis not present

## 2015-04-18 DIAGNOSIS — E041 Nontoxic single thyroid nodule: Secondary | ICD-10-CM | POA: Diagnosis not present

## 2015-04-20 DIAGNOSIS — M47816 Spondylosis without myelopathy or radiculopathy, lumbar region: Secondary | ICD-10-CM | POA: Diagnosis not present

## 2015-04-20 DIAGNOSIS — M961 Postlaminectomy syndrome, not elsewhere classified: Secondary | ICD-10-CM | POA: Diagnosis not present

## 2015-04-20 DIAGNOSIS — M5416 Radiculopathy, lumbar region: Secondary | ICD-10-CM | POA: Diagnosis not present

## 2015-05-29 ENCOUNTER — Other Ambulatory Visit: Payer: Self-pay | Admitting: Endocrinology

## 2015-05-30 ENCOUNTER — Encounter: Payer: Self-pay | Admitting: Endocrinology

## 2015-05-30 ENCOUNTER — Ambulatory Visit (INDEPENDENT_AMBULATORY_CARE_PROVIDER_SITE_OTHER): Payer: Medicare Other | Admitting: Endocrinology

## 2015-05-30 VITALS — BP 128/60 | HR 58 | Temp 98.5°F | Ht 66.0 in | Wt 280.0 lb

## 2015-05-30 DIAGNOSIS — H9190 Unspecified hearing loss, unspecified ear: Secondary | ICD-10-CM | POA: Diagnosis not present

## 2015-05-30 DIAGNOSIS — E1122 Type 2 diabetes mellitus with diabetic chronic kidney disease: Secondary | ICD-10-CM

## 2015-05-30 DIAGNOSIS — N183 Chronic kidney disease, stage 3 (moderate): Secondary | ICD-10-CM

## 2015-05-30 DIAGNOSIS — Z794 Long term (current) use of insulin: Secondary | ICD-10-CM

## 2015-05-30 DIAGNOSIS — D510 Vitamin B12 deficiency anemia due to intrinsic factor deficiency: Secondary | ICD-10-CM | POA: Diagnosis not present

## 2015-05-30 LAB — POCT GLYCOSYLATED HEMOGLOBIN (HGB A1C): HEMOGLOBIN A1C: 8.1

## 2015-05-30 MED ORDER — CYANOCOBALAMIN 1000 MCG/ML IJ SOLN
1000.0000 ug | Freq: Once | INTRAMUSCULAR | Status: AC
  Administered 2015-05-30: 1000 ug via INTRAMUSCULAR

## 2015-05-30 MED ORDER — COLCHICINE 0.6 MG PO TABS: ORAL_TABLET | ORAL | Status: DC

## 2015-05-30 NOTE — Progress Notes (Signed)
Subjective:    Patient ID: Donald [name], male    DOB: 01-08-1946, 70 y.o.   MRN: [id number]  HPI Pt returns for f/u of diabetes mellitus: DM type: Insulin-requiring type 2 Dx'ed: 123XX123 Complications: polyneuropathy, CAD and renal insufficiency Therapy: insulin since 1995 DKA: never.  Severe hypoglycemia: never.  Pancreatitis: never Other: he takes multiple daily injections; he declines weight-loss surgery; he has severe insulin resistance. Interval history: he brings a record of his cbg's which i have reviewed today.  He check fasting and afternoon.  It varies from 88-160. There is no trend throughout the day. Pt states of severe pain at the instep of the right foot, but no assoc fever.   Past Medical History  Diagnosis Date  . DIABETES MELLITUS, TYPE II 07/26/2006  . DYSLIPIDEMIA 04/13/2009  . ANEMIA-IRON DEFICIENCY 07/26/2006  . Pernicious anemia 11/20/2006  . DEPRESSION 03/14/2009  . PERIPHERAL NEUROPATHY 07/26/2006  . HYPERTENSION 07/26/2006  . PULMONARY EMBOLISM 10/05/2007  . D V T 12/03/2007  . ASTHMA 07/26/2006  . GERD 07/26/2006  . [place] DISEASE, LUMBAR 10/05/2007  . INSOMNIA 08/21/2007  . OBSTRUCTIVE SLEEP APNEA 12/03/2007  . COLONIC POLYPS, HX OF 07/26/2006  . Shortness of breath dyspnea   . Heart murmur   . Neuropathy ([place])   . Bell's palsy   . Chronic kidney disease     stage 3 renal failure  . Gout   . Back pain, chronic   . Edema     ankle  . Renal insufficiency     Past Surgical History  Procedure Laterality Date  . Appendectomy  1968  . Back surgery  1977, 06/14/2008    Dr. Trenton Gammon at [facility] (06/10)  . Cardiac catheterization    . Tonsillectomy    . Carpal tunnel release    . Shoulder arthroscopy w/ rotator cuff repair    . Lithotripsy    . Cataract extraction w/phaco Left 05/30/2014    Procedure: CATARACT EXTRACTION PHACO AND INTRAOCULAR LENS PLACEMENT (IOC);  Surgeon: Estill Cotta, MD;  Location: [facility] ORS;  Service: Ophthalmology;   Laterality: Left;  [name] 01:20 AP% 23.7 CDE 31.86    Social History   Social History  . Marital Status: Married    Spouse Name: N/A  . Number of Children: N/A  . Years of Education: N/A   Occupational History  . Disabled    Social History Main Topics  . Smoking status: Never Smoker   . Smokeless tobacco: Never Used  . Alcohol Use: No  . Drug Use: No  . Sexual Activity: Not on file   Other Topics Concern  . 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   Activity is severely limited by medical problems   Diet is "good".   Never a smoker   Alcohol: none          Current Outpatient Prescriptions on File Prior to Visit  Medication Sig Dispense Refill  . allopurinol (ZYLOPRIM) 300 MG tablet Take 1 tablet (300 mg total) by mouth daily. 30 tablet 11  . ALPRAZolam (XANAX) 1 MG tablet TAKE ONE TABLET AT BEDTIME AS NEEDED FORSLEEP 30 tablet 3  . amLODipine (NORVASC) 5 MG tablet     . aspirin 81 MG chewable tablet Chew 81 mg by mouth daily.    . cyanocobalamin (,VITAMIN B-12,) 1000 MCG/ML injection 1 injection every 3 months     . furosemide (  LASIX) 80 MG tablet TAKE ONE TABLET EVERY DAY 30 tablet 2  . gabapentin (NEURONTIN) 600 MG tablet TAKE 1 TABLET BY MOUTH 3 TIMES DAILY 60 tablet 2  . glucose blood (ONETOUCH VERIO) test strip 1 each by Other route 2 (two) times daily. And lancets 2/day 100 each 12  . HYDROcodone-acetaminophen (NORCO/VICODIN) 5-325 MG per tablet Take 1 tablet by mouth every 6 (six) hours as needed for moderate pain.    Marland Kitchen insulin lispro (HUMALOG) 100 UNIT/ML injection Inject into the skin 3 (three) times daily before meals. 200 units with breakfast 150 units with lunch and 260 units with the evening meal    . insulin NPH Human (HUMULIN N) 100 UNIT/ML injection Inject 2.8 mLs (280 Units total) into the skin at bedtime. 200 mL 1  . losartan (COZAAR) 100 MG tablet TAKE ONE TABLET AT BEDTIME 30  tablet 2  . lovastatin (MEVACOR) 40 MG tablet TAKE TWO TABLETS BY MOUTH AT BEDTIME 60 tablet 2  . Sennosides-Docusate Sodium (STOOL SOFTENER & LAXATIVE PO) Take 2 tablets by mouth at bedtime.    . sildenafil (REVATIO) 20 MG tablet 1-5 pills, as needed 90 tablet 11  . VENTOLIN HFA 108 (90 BASE) MCG/ACT inhaler 2 puffs every 4-6 hours as needed for wheezing/shortness of breath     No current facility-administered medications on file prior to visit.    Allergies  Allergen Reactions  . Codeine     REACTION: chest pain  . Niacin-Lovastatin Er   . Pioglitazone     REACTION to Actos: swelling in ankles    Family History  Problem Relation Age of Onset  . Cancer Mother     Breast Cancer  . Cancer Sister     Breast Cancer    BP 128/60 mmHg  Pulse 58  Temp(Src) 98.5 F (36.9 C) (Oral)  Ht 5\' 6"  (1.676 m)  Wt 280 lb (127.007 kg)  BMI 45.21 kg/m2  SpO2 95%   Review of Systems He denies hypoglycemia and weight change    Objective:   Physical Exam VITAL SIGNS:  See vs page GENERAL: no distress Pulses: dorsalis pedis intact bilat.  Feet: no deformity. feet are of normal color and temp.  CV: 1+ bilat leg edema.  Skin: no ulcer on the feet.  Neuro: sensation is intact to touch on the feet, but decreased from normal.    A1c=8.1%    Assessment & Plan:  Foot pain, recurrent, prod due to gout. DM: he needs increased rx.  Dyslipidemia: there is an interaction between colchicine and lovastatin.   Patient is advised the following: Patient Instructions  check your blood sugar twice a day.  vary the time of day when you check, between before the 3 meals, and at bedtime.  also check if you have symptoms of your blood sugar being too high or too low.  please keep a record of the readings and bring it to your next appointment here.  You can write it on any piece of paper.  please call us sooner if your blood sugar goes below 70, or if you have a lot of readings over 200.  Please come  back for a follow-up appointment in 4 months.  Please increase the breakfast insulin to 200 units.  i have sent a prescription to your pharmacy, to refill the colchicine. On days you take this, skip the lovastatin, due to an interaction.  Please see the Rheumatologist as scheduled.

## 2015-05-30 NOTE — Patient Instructions (Addendum)
check your blood sugar twice a day.  vary the time of day when you check, between before the 3 meals, and at bedtime.  also check if you have symptoms of your blood sugar being too high or too low.  please keep a record of the readings and bring it to your next appointment here.  You can write it on any piece of paper.  please call us sooner if your blood sugar goes below 70, or if you have a lot of readings over 200.  Please come back for a follow-up appointment in 4 months.  Please increase the breakfast insulin to 200 units.  i have sent a prescription to your pharmacy, to refill the colchicine. On days you take this, skip the lovastatin, due to an interaction.  Please see the Rheumatologist as scheduled.

## 2015-06-14 DIAGNOSIS — M1009 Idiopathic gout, multiple sites: Secondary | ICD-10-CM | POA: Diagnosis not present

## 2015-06-14 DIAGNOSIS — M150 Primary generalized (osteo)arthritis: Secondary | ICD-10-CM | POA: Diagnosis not present

## 2015-06-14 DIAGNOSIS — M79671 Pain in right foot: Secondary | ICD-10-CM | POA: Diagnosis not present

## 2015-06-14 DIAGNOSIS — M545 Low back pain: Secondary | ICD-10-CM | POA: Diagnosis not present

## 2015-06-14 DIAGNOSIS — M79641 Pain in right hand: Secondary | ICD-10-CM | POA: Diagnosis not present

## 2015-06-14 DIAGNOSIS — M79642 Pain in left hand: Secondary | ICD-10-CM | POA: Diagnosis not present

## 2015-06-14 DIAGNOSIS — M79672 Pain in left foot: Secondary | ICD-10-CM | POA: Diagnosis not present

## 2015-07-10 ENCOUNTER — Other Ambulatory Visit: Payer: Self-pay | Admitting: Endocrinology

## 2015-07-13 DIAGNOSIS — M47816 Spondylosis without myelopathy or radiculopathy, lumbar region: Secondary | ICD-10-CM | POA: Diagnosis not present

## 2015-07-13 DIAGNOSIS — M5416 Radiculopathy, lumbar region: Secondary | ICD-10-CM | POA: Diagnosis not present

## 2015-07-13 DIAGNOSIS — M961 Postlaminectomy syndrome, not elsewhere classified: Secondary | ICD-10-CM | POA: Diagnosis not present

## 2015-07-22 ENCOUNTER — Other Ambulatory Visit: Payer: Self-pay | Admitting: Endocrinology

## 2015-07-24 DIAGNOSIS — M545 Low back pain: Secondary | ICD-10-CM | POA: Diagnosis not present

## 2015-07-24 DIAGNOSIS — M1009 Idiopathic gout, multiple sites: Secondary | ICD-10-CM | POA: Diagnosis not present

## 2015-07-24 DIAGNOSIS — R221 Localized swelling, mass and lump, neck: Secondary | ICD-10-CM | POA: Diagnosis not present

## 2015-07-24 DIAGNOSIS — H903 Sensorineural hearing loss, bilateral: Secondary | ICD-10-CM | POA: Diagnosis not present

## 2015-07-24 DIAGNOSIS — M150 Primary generalized (osteo)arthritis: Secondary | ICD-10-CM | POA: Diagnosis not present

## 2015-08-02 ENCOUNTER — Encounter (INDEPENDENT_AMBULATORY_CARE_PROVIDER_SITE_OTHER): Payer: Self-pay

## 2015-08-02 ENCOUNTER — Ambulatory Visit (INDEPENDENT_AMBULATORY_CARE_PROVIDER_SITE_OTHER): Payer: Medicare Other | Admitting: Adult Health

## 2015-08-02 ENCOUNTER — Encounter: Payer: Self-pay | Admitting: Adult Health

## 2015-08-02 VITALS — BP 118/68 | HR 51 | Temp 98.2°F | Ht 66.0 in | Wt 279.0 lb

## 2015-08-02 DIAGNOSIS — G4733 Obstructive sleep apnea (adult) (pediatric): Secondary | ICD-10-CM

## 2015-08-02 DIAGNOSIS — M5416 Radiculopathy, lumbar region: Secondary | ICD-10-CM | POA: Diagnosis not present

## 2015-08-02 NOTE — Assessment & Plan Note (Signed)
Doing well on CPAP   Plan  Continue on CPAP At bedtime and with naps.  Continue to work on weight loss Follow up Dr. Elsworth Soho  In 1 year and As needed

## 2015-08-02 NOTE — Patient Instructions (Signed)
Continue on CPAP At bedtime and with naps.  Continue to work on weight loss Follow up Dr. Elsworth Soho  In 1 year and As needed

## 2015-08-02 NOTE — Addendum Note (Signed)
Addended by: Osa Craver on: 08/02/2015 12:44 PM   Modules accepted: Orders

## 2015-08-02 NOTE — Progress Notes (Signed)
Subjective:    Patient ID: Donald [name], male    DOB: 05/20/1945, 70 y.o.   MRN: [id number]  HPI 74 yowm never smoker for FU of obstructive sleep apnea & asthmatic bronchitis 1-2 episodes /year.  He developed DVT/pulmonary embolism 09/2007 -seemingly idiopathic,subseq underwent back surgery 06/2008 by Dr Trenton Gammon- Off coumadin since 10/2008  He also had a chronic cough, which was attributed to GERD.    Significant tests/ events 6/00: Polysomnogram at [facility], RDI 138/h, lowest desatn 77%,maintained on CPAP+ 15 with a nasal mask. ([facility], [place]) since.  3/ 2012- - rpt cpap titration at [place], on 15 cm   09/2007 >> duplex >>echogenic material is seen in the left profunda femoral, femoral and popliteal veins.  02/2008 >>residual thrombus in left prox to distal SFV  04/2008 residual thrombus in Lt SFV persists   11/2007 CT angio shows incomplete recanalisation of pulmonary arteries      08/02/2015  Chief Complaint  Patient presents with  . Follow-up    RA pt here for a yearly follow up for OSA. Pt uses CPAP on average 7-8 hours nightly. Mask fits well.    Patient returns for one-year follow-up for sleep apnea. Patient says he's doing very well. Her CPAP on average 7-8 hours each night. Has no problem with his mask supplies. He denies any significant daytime sleepiness.Marland Kitchen He did not bring in his C Pap card today. Denies chest pain, orthopnea, PND, or increased leg swelling Discussed weight loss. Has hard time exercising , had chronic back pian.  Gets epidurals pain injections every few months.    Past Medical History  Diagnosis Date  . DIABETES MELLITUS, TYPE II 07/26/2006  . DYSLIPIDEMIA 04/13/2009  . ANEMIA-IRON DEFICIENCY 07/26/2006  . Pernicious anemia 11/20/2006  . DEPRESSION 03/14/2009  . PERIPHERAL NEUROPATHY 07/26/2006  . HYPERTENSION 07/26/2006  . PULMONARY EMBOLISM 10/05/2007  . D V T 12/03/2007  . ASTHMA 07/26/2006  . GERD  07/26/2006  . [place] DISEASE, LUMBAR 10/05/2007  . INSOMNIA 08/21/2007  . OBSTRUCTIVE SLEEP APNEA 12/03/2007  . COLONIC POLYPS, HX OF 07/26/2006  . Shortness of breath dyspnea   . Heart murmur   . Neuropathy ([place])   . Bell's palsy   . Chronic kidney disease     stage 3 renal failure  . Gout   . Back pain, chronic   . Edema     ankle  . Renal insufficiency    Current Outpatient Prescriptions on File Prior to Visit  Medication Sig Dispense Refill  . ALPRAZolam (XANAX) 1 MG tablet TAKE ONE TABLET AT BEDTIME AS NEEDED FORSLEEP 30 tablet 3  . amLODipine (NORVASC) 5 MG tablet     . aspirin 81 MG chewable tablet Chew 81 mg by mouth daily.    . colchicine (COLCRYS) 0.6 MG tablet 1 tab every hour until symptoms are better, max of 6 per day.  Stop if diarrhea. (Patient taking differently: Take 0.6 mg by mouth daily. ) 30 tablet 11  . cyanocobalamin (,VITAMIN B-12,) 1000 MCG/ML injection 1 injection every 3 months     . furosemide (LASIX) 80 MG tablet TAKE ONE TABLET EVERY DAY 30 tablet 2  . gabapentin (NEURONTIN) 600 MG tablet TAKE ONE TABLET BY MOUTH 3 TIMES DAILY 60 tablet 2  . glucose blood (ONETOUCH VERIO) test strip 1 each by Other route 2 (two) times daily. And lancets 2/day 100 each 12  . HYDROcodone-acetaminophen (NORCO/VICODIN) 5-325 MG per tablet Take 1 tablet by mouth every  6 (six) hours as needed for moderate pain.    Marland Kitchen insulin lispro (HUMALOG) 100 UNIT/ML injection Inject into the skin 3 (three) times daily before meals. 200 units with breakfast 150 units with lunch and 260 units with the evening meal    . insulin NPH Human (HUMULIN N) 100 UNIT/ML injection Inject 2.8 mLs (280 Units total) into the skin at bedtime. 200 mL 1  . losartan (COZAAR) 100 MG tablet TAKE ONE TABLET AT BEDTIME 30 tablet 2  . lovastatin (MEVACOR) 40 MG tablet TAKE TWO TABLETS BY MOUTH AT BEDTIME 60 tablet 2  . Sennosides-Docusate Sodium (STOOL SOFTENER & LAXATIVE PO) Take 2 tablets by mouth at bedtime.    .  sildenafil (REVATIO) 20 MG tablet 1-5 pills, as needed 90 tablet 11  . VENTOLIN HFA 108 (90 BASE) MCG/ACT inhaler 2 puffs every 4-6 hours as needed for wheezing/shortness of breath     No current facility-administered medications on file prior to visit.      Review of Systems  neg for any significant sore throat, dysphagia, itching, sneezing, nasal congestion or excess/ purulent secretions, fever, chills, sweats, unintended wt loss, pleuritic or exertional cp, hempoptysis, orthopnea pnd or change in chronic leg swelling. Also denies presyncope, palpitations, heartburn, abdominal pain, nausea, vomiting, diarrhea or change in bowel or urinary habits, dysuria,hematuria, rash, arthralgias, visual complaints, headache, numbness weakness or ataxia.     Objective:   Physical Exam  Filed Vitals:   08/02/15 1131  BP: 118/68  Pulse: 51  Temp: 98.2 F (36.8 C)  TempSrc: Oral  Height: 5\' 6"  (1.676 m)  Weight: 279 lb (126.554 kg)  SpO2: 96%  Body mass index is 45.05 kg/(m^2).    Gen. Pleasant, obese, in no distress ENT - no lesions, no post nasal drip, class 2-3 MP airway  Neck: No JVD, no thyromegaly, no carotid bruits Lungs: no use of accessory muscles, no dullness to percussion, decreased without rales or rhonchi  Cardiovascular: Rhythm regular, heart sounds  normal, no murmurs or gallops, tr  peripheral edema Musculoskeletal: No deformities, no cyanosis or clubbing , no tremors  Leron Stoffers NP-C  [facility]  08/02/2015      Assessment & Plan:

## 2015-08-08 DIAGNOSIS — M150 Primary generalized (osteo)arthritis: Secondary | ICD-10-CM | POA: Diagnosis not present

## 2015-08-21 ENCOUNTER — Encounter: Payer: Self-pay | Admitting: Adult Health

## 2015-08-31 DIAGNOSIS — I129 Hypertensive chronic kidney disease with stage 1 through stage 4 chronic kidney disease, or unspecified chronic kidney disease: Secondary | ICD-10-CM | POA: Diagnosis not present

## 2015-08-31 DIAGNOSIS — Z6841 Body Mass Index (BMI) 40.0 and over, adult: Secondary | ICD-10-CM | POA: Diagnosis not present

## 2015-08-31 DIAGNOSIS — G4733 Obstructive sleep apnea (adult) (pediatric): Secondary | ICD-10-CM | POA: Diagnosis not present

## 2015-08-31 DIAGNOSIS — N200 Calculus of kidney: Secondary | ICD-10-CM | POA: Diagnosis not present

## 2015-08-31 DIAGNOSIS — D509 Iron deficiency anemia, unspecified: Secondary | ICD-10-CM | POA: Diagnosis not present

## 2015-08-31 DIAGNOSIS — E1122 Type 2 diabetes mellitus with diabetic chronic kidney disease: Secondary | ICD-10-CM | POA: Diagnosis not present

## 2015-08-31 DIAGNOSIS — Z9989 Dependence on other enabling machines and devices: Secondary | ICD-10-CM | POA: Diagnosis not present

## 2015-08-31 DIAGNOSIS — N2581 Secondary hyperparathyroidism of renal origin: Secondary | ICD-10-CM | POA: Diagnosis not present

## 2015-08-31 DIAGNOSIS — N183 Chronic kidney disease, stage 3 (moderate): Secondary | ICD-10-CM | POA: Diagnosis not present

## 2015-09-01 ENCOUNTER — Telehealth: Payer: Self-pay | Admitting: Adult Health

## 2015-09-01 NOTE — Telephone Encounter (Signed)
Per verbal order from TP after reviewing CPAP compliance report from 05/24/15-08/21/15  Excellent control  No changes  Called spoke with pt. Reviewed recs. He voiced understanding and had no further questions. Nothing further needed.

## 2015-09-05 ENCOUNTER — Other Ambulatory Visit: Payer: Self-pay | Admitting: Endocrinology

## 2015-09-29 ENCOUNTER — Ambulatory Visit: Payer: Medicare Other | Admitting: Endocrinology

## 2015-10-02 ENCOUNTER — Other Ambulatory Visit: Payer: Self-pay | Admitting: Endocrinology

## 2015-10-02 MED ORDER — ALPRAZOLAM 1 MG PO TABS: ORAL_TABLET | ORAL | 3 refills | Status: DC

## 2015-10-09 ENCOUNTER — Encounter: Payer: Self-pay | Admitting: Endocrinology

## 2015-10-09 ENCOUNTER — Ambulatory Visit
Admission: RE | Admit: 2015-10-09 | Discharge: 2015-10-09 | Disposition: A | Discharge status: Home / Self Care | Payer: Medicare Other | Source: Ambulatory Visit | Attending: Endocrinology | Admitting: Endocrinology

## 2015-10-09 ENCOUNTER — Ambulatory Visit (INDEPENDENT_AMBULATORY_CARE_PROVIDER_SITE_OTHER): Payer: Medicare Other | Admitting: Endocrinology

## 2015-10-09 VITALS — BP 142/60 | HR 55 | Ht 66.0 in | Wt 280.0 lb

## 2015-10-09 DIAGNOSIS — E538 Deficiency of other specified B group vitamins: Secondary | ICD-10-CM | POA: Diagnosis not present

## 2015-10-09 DIAGNOSIS — E1122 Type 2 diabetes mellitus with diabetic chronic kidney disease: Secondary | ICD-10-CM | POA: Diagnosis not present

## 2015-10-09 DIAGNOSIS — N183 Chronic kidney disease, stage 3 unspecified: Secondary | ICD-10-CM

## 2015-10-09 DIAGNOSIS — Z794 Long term (current) use of insulin: Secondary | ICD-10-CM | POA: Diagnosis not present

## 2015-10-09 DIAGNOSIS — M545 Low back pain, unspecified: Secondary | ICD-10-CM

## 2015-10-09 DIAGNOSIS — M549 Dorsalgia, unspecified: Secondary | ICD-10-CM | POA: Insufficient documentation

## 2015-10-09 DIAGNOSIS — M5126 Other intervertebral disc displacement, lumbar region: Secondary | ICD-10-CM | POA: Diagnosis not present

## 2015-10-09 DIAGNOSIS — Z23 Encounter for immunization: Secondary | ICD-10-CM

## 2015-10-09 LAB — URINALYSIS, ROUTINE W REFLEX MICROSCOPIC
Bilirubin Urine: NEGATIVE
KETONES UR: NEGATIVE
Leukocytes, UA: NEGATIVE
NITRITE: NEGATIVE
Specific Gravity, Urine: <=1.005 — AB (ref 1.000–1.030)
Total Protein, Urine: NEGATIVE
URINE GLUCOSE: NEGATIVE
Urobilinogen, UA: 0.2 (ref 0.0–1.0)
WBC UA: NONE SEEN (ref 0–?)
pH: 6.5 (ref 5.0–8.0)

## 2015-10-09 LAB — POCT GLYCOSYLATED HEMOGLOBIN (HGB A1C): HEMOGLOBIN A1C: 8.4

## 2015-10-09 MED ORDER — CYANOCOBALAMIN 1000 MCG/ML IJ SOLN
1000.0000 ug | Freq: Once | INTRAMUSCULAR | Status: AC
  Administered 2015-10-09: 1000 ug via INTRAMUSCULAR

## 2015-10-09 MED ORDER — INSULIN NPH (HUMAN) (ISOPHANE) 100 UNIT/ML ~~LOC~~ SUSP: 310.0000 [IU] | Freq: Every day | SUBCUTANEOUS | 1 refills | Status: DC

## 2015-10-09 NOTE — Progress Notes (Signed)
Subjective:    Patient ID: Donald [name], male    DOB: 07/08/1945, 70 y.o.   MRN: [id number]  HPI Pt returns for f/u of diabetes mellitus: DM type: Insulin-requiring type 2 Dx'ed: 8756 Complications: polyneuropathy, CAD and renal insufficiency Therapy: insulin since 1995 DKA: never.  Severe hypoglycemia: never.  Pancreatitis: never Other: he takes multiple daily injections; he declines weight-loss surgery; he has severe insulin resistance. Interval history: he brings a record of his cbg's which i have reviewed today.  He checks fasting and in the afternoon.  It varies from 96-210.  It is in general higher in the fasting state.   Pt states few months of severe pain at the lower back, rad to the right hip.  No assoc bowel or bladder retention.  He see Dr Nelva Bush for the pain, and last steroid injection was 2 mos ago.  Past Medical History:  Diagnosis Date  . ANEMIA-IRON DEFICIENCY 07/26/2006  . ASTHMA 07/26/2006  . Back pain, chronic   . Bell's palsy   . Chronic kidney disease    stage 3 renal failure  . COLONIC POLYPS, HX OF 07/26/2006  . D V T 12/03/2007  . DEPRESSION 03/14/2009  . DIABETES MELLITUS, TYPE II 07/26/2006  . [place] DISEASE, LUMBAR 10/05/2007  . DYSLIPIDEMIA 04/13/2009  . Edema    ankle  . GERD 07/26/2006  . Gout   . Heart murmur   . HYPERTENSION 07/26/2006  . INSOMNIA 08/21/2007  . Neuropathy ([place])   . OBSTRUCTIVE SLEEP APNEA 12/03/2007  . PERIPHERAL NEUROPATHY 07/26/2006  . Pernicious anemia 11/20/2006  . PULMONARY EMBOLISM 10/05/2007  . Renal insufficiency   . Shortness of breath dyspnea     Past Surgical History:  Procedure Laterality Date  . APPENDECTOMY  1968  . [place], 06/14/2008   Dr. Trenton Gammon at [facility] (06/10)  . CARDIAC CATHETERIZATION    . CARPAL TUNNEL RELEASE    . CATARACT EXTRACTION W/PHACO Left 05/30/2014   Procedure: CATARACT EXTRACTION PHACO AND INTRAOCULAR LENS PLACEMENT (IOC);  Surgeon: Estill Cotta, MD;  Location: [facility]  ORS;  Service: Ophthalmology;  Laterality: Left;  [name] 01:20 AP% 23.7 CDE 31.86  . LITHOTRIPSY    . SHOULDER ARTHROSCOPY W/ ROTATOR CUFF REPAIR    . TONSILLECTOMY      Social History   Social History  . Marital status: Married    Spouse name: N/A  . Number of children: N/A  . Years of education: N/A   Occupational History  . Disabled Disability   Social History Main Topics  . Smoking status: Never Smoker  . Smokeless tobacco: Never Used  . Alcohol use No  . Drug use: No  . Sexual activity: Not on file   Other Topics Concern  . 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   Activity is severely limited by medical problems   Diet is "good".   Never a smoker   Alcohol: none          Current Outpatient Prescriptions on File Prior to Visit  Medication Sig Dispense Refill  . allopurinol (ZYLOPRIM) 100 MG tablet Take 400 mg by mouth daily.     Marland Kitchen ALPRAZolam (XANAX) 1 MG tablet TAKE ONE TABLET AT BEDTIME AS NEEDED FORSLEEP 30 tablet 3  . amLODipine (NORVASC) 5 MG tablet     . aspirin 81 MG chewable tablet Chew 81 mg by mouth daily.    Marland Kitchen  colchicine (COLCRYS) 0.6 MG tablet 1 tab every hour until symptoms are better, max of 6 per day.  Stop if diarrhea. (Patient taking differently: Take 0.6 mg by mouth daily. ) 30 tablet 11  . cyanocobalamin (,VITAMIN B-12,) 1000 MCG/ML injection 1 injection every 3 months     . furosemide (LASIX) 80 MG tablet TAKE ONE TABLET BY MOUTH EVERY DAY 90 tablet 3  . gabapentin (NEURONTIN) 600 MG tablet TAKE ONE TABLET BY MOUTH 3 TIMES DAILY 60 tablet 2  . glucose blood (ONETOUCH VERIO) test strip 1 each by Other route 2 (two) times daily. And lancets 2/day 100 each 12  . insulin lispro (HUMALOG) 100 UNIT/ML injection Inject into the skin 3 (three) times daily before meals. 200 units with breakfast 150 units with lunch and 260 units with the evening meal    . losartan (COZAAR) 100 MG  tablet TAKE ONE TABLET BY MOUTH AT BEDTIME 90 tablet 3  . lovastatin (MEVACOR) 40 MG tablet TAKE TWO TABLETS BY MOUTH AT BEDTIME 60 tablet 2  . Sennosides-Docusate Sodium (STOOL SOFTENER & LAXATIVE PO) Take 2 tablets by mouth at bedtime.    . sildenafil (REVATIO) 20 MG tablet 1-5 pills, as needed 90 tablet 11  . VENTOLIN HFA 108 (90 BASE) MCG/ACT inhaler 2 puffs every 4-6 hours as needed for wheezing/shortness of breath     No current facility-administered medications on file prior to visit.     Allergies  Allergen Reactions  . Codeine     REACTION: chest pain  . Niacin-Lovastatin Er   . Pioglitazone     REACTION to Actos: swelling in ankles    Family History  Problem Relation Age of Onset  . Cancer Mother     Breast Cancer  . Cancer Sister     Breast Cancer    BP (!) 142/60   Pulse (!) 55   Ht 5\' 6"  (1.676 m)   Wt 280 lb (127 kg)   SpO2 97%   BMI 45.19 kg/m   Review of Systems He denies hypoglycemia and numbness.       Objective:   Physical Exam  VITAL SIGNS:  See vs page GENERAL: no distress.  Spine: nontender.   Pulses: dorsalis pedis intact bilat.  Feet: no deformity. feet are of normal color and temp.  CV: 2+ bilat leg edema.  Skin: no ulcer on the feet.  Neuro: sensation is intact to touch on the feet, but decreased from normal.   A1c=8.4%    Assessment & Plan:  Insulin-requiring type 2: worse Low-back pain, persistent.  HTN: prob exac by pain.  We'll recheck in the future.

## 2015-10-09 NOTE — Patient Instructions (Addendum)
check your blood sugar twice a day.  vary the time of day when you check, between before the 3 meals, and at bedtime.  also check if you have symptoms of your blood sugar being too high or too low.  please keep a record of the readings and bring it to your next appointment here.  You can write it on any piece of paper.  please call us sooner if your blood sugar goes below 70, or if you have a lot of readings over 200.  Please come back for a follow-up appointment in 2 months.   X-rays and a urine test are requested for you today.  We'll let you know about the results.  Please increase the NPH insulin to 310 units at bedtime.

## 2015-10-10 ENCOUNTER — Other Ambulatory Visit: Payer: Self-pay | Admitting: Endocrinology

## 2015-10-19 ENCOUNTER — Other Ambulatory Visit: Payer: Self-pay | Admitting: Endocrinology

## 2015-10-24 ENCOUNTER — Other Ambulatory Visit: Payer: Self-pay | Admitting: Endocrinology

## 2015-10-24 ENCOUNTER — Other Ambulatory Visit: Payer: Self-pay

## 2015-10-24 DIAGNOSIS — R221 Localized swelling, mass and lump, neck: Secondary | ICD-10-CM | POA: Diagnosis not present

## 2015-10-24 DIAGNOSIS — H6063 Unspecified chronic otitis externa, bilateral: Secondary | ICD-10-CM | POA: Diagnosis not present

## 2015-10-24 DIAGNOSIS — J0101 Acute recurrent maxillary sinusitis: Secondary | ICD-10-CM | POA: Diagnosis not present

## 2015-10-24 DIAGNOSIS — H6123 Impacted cerumen, bilateral: Secondary | ICD-10-CM | POA: Diagnosis not present

## 2015-10-24 NOTE — Telephone Encounter (Signed)
Request for allopurinol refill, never saw [name], sean elliston is pcp. Faxed back to pharmacy with this ino.

## 2015-11-02 DIAGNOSIS — M961 Postlaminectomy syndrome, not elsewhere classified: Secondary | ICD-10-CM | POA: Diagnosis not present

## 2015-11-02 DIAGNOSIS — M47816 Spondylosis without myelopathy or radiculopathy, lumbar region: Secondary | ICD-10-CM | POA: Diagnosis not present

## 2015-11-03 ENCOUNTER — Other Ambulatory Visit: Payer: Self-pay | Admitting: Endocrinology

## 2015-11-07 DIAGNOSIS — M150 Primary generalized (osteo)arthritis: Secondary | ICD-10-CM | POA: Diagnosis not present

## 2015-11-07 DIAGNOSIS — M1009 Idiopathic gout, multiple sites: Secondary | ICD-10-CM | POA: Diagnosis not present

## 2015-11-07 DIAGNOSIS — L409 Psoriasis, unspecified: Secondary | ICD-10-CM | POA: Diagnosis not present

## 2015-11-07 DIAGNOSIS — M545 Low back pain: Secondary | ICD-10-CM | POA: Diagnosis not present

## 2015-11-14 ENCOUNTER — Ambulatory Visit (INDEPENDENT_AMBULATORY_CARE_PROVIDER_SITE_OTHER): Payer: Medicare Other | Admitting: Adult Health

## 2015-11-14 ENCOUNTER — Encounter: Payer: Self-pay | Admitting: Adult Health

## 2015-11-14 ENCOUNTER — Telehealth: Payer: Self-pay | Admitting: Adult Health

## 2015-11-14 DIAGNOSIS — J4531 Mild persistent asthma with (acute) exacerbation: Secondary | ICD-10-CM | POA: Diagnosis not present

## 2015-11-14 MED ORDER — AZITHROMYCIN 250 MG PO TABS: ORAL_TABLET | ORAL | 0 refills | Status: DC

## 2015-11-14 NOTE — Assessment & Plan Note (Signed)
Mild flare with URI /AR  Hold on steroids for now   Plan  Patient Instructions  Zpack take as directed.  Mucinex DM Twice daily  As needed  Cough /congestion  May try Allegra 180mg  daily As needed for drainage.  Saline nasal rinses As needed  Twice daily   Begin Flonase 2 puffs daily As needed  For nasal congestion .  Follow up Dr. Elsworth Soho  In July 2018 and As needed.  Please contact office for sooner follow up if symptoms do not improve or worsen or seek emergency care

## 2015-11-14 NOTE — Telephone Encounter (Signed)
TP you saw pt today in clinic and the pts wife is calling wanting to know if he is contagious and if so, how long will he be?  Please advise. thanks

## 2015-11-14 NOTE — Progress Notes (Signed)
Subjective:    Patient ID: Donald [name], male    DOB: 1946-01-02, 70 y.o.   MRN: [id number]  HPI69 yowm never smoker for FU of obstructive sleep apnea & asthmatic bronchitis 1-2 episodes /year.  He developed DVT/pulmonary embolism 09/2007 -seemingly idiopathic,subseq underwent back surgery 06/2008 by Dr Trenton Gammon- Off coumadin since 10/2008  He also had a chronic cough, which was attributed to GERD.    Significant tests/ events 6/00: [facility]omn[facility] at  [facility], RDI 138/h, lowest desatn 77%,maintained on CPAP+ 15 with a nasal mask. ([facility], [place]) since.  3/ 2012- - rpt cpap titration at [place], on 15 cm   09/2007 >> duplex >>echogenic material is seen in the left profunda femoral, femoral and popliteal veins.  02/2008 >>residual thrombus in left prox to distal SFV  04/2008 residual thrombus in Lt SFV persists   11/2007 CT angio shows incomplete recanalisation of pulmonary arteries    11/14/2015 Acute OV  Patient presents for an acute office visit. He complains of 2 days of cough, congestion ,sinus congestion with thick green mucus. Has sinus pain and pressure. No fever or wheezing . Taking tylenol .  Was seen 2 months ago with URI/AS seen by ENT rx amoxicilin and steroids.  Says he got better until last couple of days.  He denies any fever, hemoptysis, orthopnea, PND, or increased leg swelling.    Past Medical History:  Diagnosis Date  . ANEMIA-IRON DEFICIENCY 07/26/2006  . ASTHMA 07/26/2006  . Back pain, chronic   . Bell's palsy   . Chronic kidney disease    stage 3 renal failure  . COLONIC POLYPS, HX OF 07/26/2006  . D V T 12/03/2007  . DEPRESSION 03/14/2009  . DIABETES MELLITUS, TYPE II 07/26/2006  . [place] DISEASE, LUMBAR 10/05/2007  . DYSLIPIDEMIA 04/13/2009  . Edema    ankle  . GERD 07/26/2006  . Gout   . Heart murmur   . HYPERTENSION 07/26/2006  . INSOMNIA 08/21/2007  . Neuropathy ([place])   . OBSTRUCTIVE SLEEP APNEA 12/03/2007  .  PERIPHERAL NEUROPATHY 07/26/2006  . Pernicious anemia 11/20/2006  . PULMONARY EMBOLISM 10/05/2007  . Renal insufficiency   . Shortness of breath dyspnea    Current Outpatient Prescriptions on File Prior to Visit  Medication Sig Dispense Refill  . ALPRAZolam (XANAX) 1 MG tablet TAKE ONE TABLET AT BEDTIME AS NEEDED FORSLEEP 30 tablet 3  . amLODipine (NORVASC) 5 MG tablet     . aspirin 81 MG chewable tablet Chew 81 mg by mouth daily.    . cyanocobalamin (,VITAMIN B-12,) 1000 MCG/ML injection 1 injection every 3 months     . furosemide (LASIX) 80 MG tablet TAKE ONE TABLET BY MOUTH EVERY DAY 90 tablet 3  . gabapentin (NEURONTIN) 600 MG tablet TAKE ONE TABLET 3 TIMES DAILY 90 tablet 11  . GE100 BLOOD GLUCOSE TEST test strip TEST 3-4 TIMES DAILY 120 each 11  . insulin lispro (HUMALOG) 100 UNIT/ML injection Inject into the skin 3 (three) times daily before meals. 200 units with breakfast 150 units with lunch and 260 units with the evening meal    . insulin NPH Human (HUMULIN N) 100 UNIT/ML injection Inject 3.1 mLs (310 Units total) into the skin at bedtime. 200 mL 1  . losartan (COZAAR) 100 MG tablet TAKE ONE TABLET BY MOUTH AT BEDTIME 90 tablet 3  . lovastatin (MEVACOR) 40 MG tablet TAKE TWO TABLETS AT BEDTIME 60 tablet 11  . Sennosides-Docusate Sodium (STOOL SOFTENER & LAXATIVE PO) Take  2 tablets by mouth at bedtime.    . sildenafil (REVATIO) 20 MG tablet 1-5 pills, as needed 90 tablet 11  . VENTOLIN HFA 108 (90 BASE) MCG/ACT inhaler 2 puffs every 4-6 hours as needed for wheezing/shortness of breath    . colchicine (COLCRYS) 0.6 MG tablet 1 tab every hour until symptoms are better, max of 6 per day.  Stop if diarrhea. (Patient not taking: Reported on 11/14/2015) 30 tablet 11   No current facility-administered medications on file prior to visit.       Review of Systems  Constitutional:   No  weight loss, night sweats,  Fevers, chills, fatigue, or  lassitude.  HEENT:   No headaches,  Difficulty  swallowing,  Tooth/dental problems, or  Sore throat,                No sneezing, itching, ear ache,  +nasal congestion, post nasal drip,   CV:  No chest pain,  Orthopnea, PND, swelling in lower extremities, anasarca, dizziness, palpitations, syncope.   GI  No heartburn, indigestion, abdominal pain, nausea, vomiting, diarrhea, change in bowel habits, loss of appetite, bloody stools.   Resp:   No chest wall deformity  Skin: no rash or lesions.  GU: no dysuria, change in color of urine, no urgency or frequency.  No flank pain, no hematuria   MS:  No joint pain or swelling.  No decreased range of motion.  No back pain.  Psych:  No change in mood or affect. No depression or anxiety.  No memory loss.         Objective:   Physical Exam Vitals:   11/14/15 1055  BP: 128/68  Pulse: (!) 58  Temp: 97.8 F (36.6 C)  TempSrc: Oral  SpO2: 97%  Weight: 277 lb 12.8 oz (126 kg)  Height: 5' 6.5" (1.689 m)  Body mass index is 44.17 kg/m.    Gen. Pleasant, obese, in no distress ENT - no lesions, no post nasal drip, class 2-3 MP airway , nasal mucosa mild redness and turbinate edema  Neck: No JVD, no thyromegaly, no carotid bruits Lungs: no use of accessory muscles, no dullness to percussion, decreased without rales or rhonchi  Cardiovascular: Rhythm regular, heart sounds  normal, no murmurs or gallops, tr  peripheral edema Musculoskeletal: No deformities, no cyanosis or clubbing , no tremors  Mostyn Varnell NP-C  [facility]  11/14/2015      Assessment & Plan:

## 2015-11-14 NOTE — Telephone Encounter (Signed)
Should be okay after 24hr on abx.  Still need good hand washing and cover mouth with coughing.  Please contact office for sooner follow up if symptoms do not improve or worsen or seek emergency care

## 2015-11-14 NOTE — Patient Instructions (Addendum)
Zpack take as directed.  Mucinex DM Twice daily  As needed  Cough /congestion  May try Allegra 180mg  daily As needed for drainage.  Saline nasal rinses As needed  Twice daily   Begin Flonase 2 puffs daily As needed  For nasal congestion .  Follow up Dr. Elsworth Soho  In July 2018 and As needed.  Please contact office for sooner follow up if symptoms do not improve or worsen or seek emergency care

## 2015-11-14 NOTE — Telephone Encounter (Signed)
Spoke with pt's wife, aware of recs.  Nothing further needed.  

## 2015-11-22 DIAGNOSIS — M47816 Spondylosis without myelopathy or radiculopathy, lumbar region: Secondary | ICD-10-CM | POA: Diagnosis not present

## 2015-11-22 DIAGNOSIS — M5136 Other intervertebral disc degeneration, lumbar region: Secondary | ICD-10-CM | POA: Diagnosis not present

## 2015-11-23 DIAGNOSIS — Z79899 Other long term (current) drug therapy: Secondary | ICD-10-CM | POA: Diagnosis not present

## 2015-11-23 DIAGNOSIS — L409 Psoriasis, unspecified: Secondary | ICD-10-CM | POA: Diagnosis not present

## 2015-11-23 DIAGNOSIS — M1009 Idiopathic gout, multiple sites: Secondary | ICD-10-CM | POA: Diagnosis not present

## 2015-11-23 DIAGNOSIS — D509 Iron deficiency anemia, unspecified: Secondary | ICD-10-CM | POA: Diagnosis not present

## 2015-11-23 DIAGNOSIS — N183 Chronic kidney disease, stage 3 (moderate): Secondary | ICD-10-CM | POA: Diagnosis not present

## 2015-11-23 DIAGNOSIS — M109 Gout, unspecified: Secondary | ICD-10-CM | POA: Diagnosis not present

## 2015-11-23 NOTE — Progress Notes (Signed)
Reviewed & agree with plan  

## 2015-12-04 ENCOUNTER — Other Ambulatory Visit: Payer: Self-pay | Admitting: Endocrinology

## 2015-12-10 NOTE — Progress Notes (Deleted)
   Subjective:    Patient ID: Donald [name], male    DOB: 1945-02-28, 70 y.o.   MRN: [id number]  HPI Pt returns for f/u of diabetes mellitus: DM type: Insulin-requiring type 2 Dx'ed: 2355 Complications: polyneuropathy, CAD and renal insufficiency Therapy: insulin since 1995 DKA: never.  Severe hypoglycemia: never.  Pancreatitis: never Other: he takes multiple daily injections; he declines weight-loss surgery; he has severe insulin resistance. Interval history: he brings a record of his cbg's which i have reviewed today.  He checks fasting and in the afternoon.  It varies from 96-210.  It is in general higher in the fasting state.   Pt states few months of severe pain at the lower back, rad to the right hip.  No assoc bowel or bladder retention.  He see Dr Nelva Bush for the pain, and last steroid injection was 2 mos ago.   UTD    Review of Systems     Objective:   Physical Exam VITAL SIGNS:  See vs page GENERAL: no distress.  Spine: nontender.   Pulses: dorsalis pedis intact bilat.  Feet: no deformity. feet are of normal color and temp.  CV: 2+ bilat leg edema.  Skin: no ulcer on the feet.  Neuro: sensation is intact to touch on the feet, but decreased from normal.       Assessment & Plan:  Insulin-requiring type 2: worse Low-back pain, persistent.  HTN: prob exac by pain.  We'll recheck in the future.

## 2015-12-11 ENCOUNTER — Ambulatory Visit: Payer: Medicare Other | Admitting: Endocrinology

## 2015-12-14 ENCOUNTER — Ambulatory Visit: Payer: Medicare Other | Admitting: Endocrinology

## 2015-12-14 ENCOUNTER — Other Ambulatory Visit: Payer: Self-pay

## 2015-12-14 ENCOUNTER — Ambulatory Visit (INDEPENDENT_AMBULATORY_CARE_PROVIDER_SITE_OTHER): Payer: Medicare Other | Admitting: Endocrinology

## 2015-12-14 ENCOUNTER — Encounter: Payer: Self-pay | Admitting: Endocrinology

## 2015-12-14 VITALS — BP 132/70 | HR 60 | Ht 66.0 in | Wt 276.0 lb

## 2015-12-14 DIAGNOSIS — E538 Deficiency of other specified B group vitamins: Secondary | ICD-10-CM | POA: Diagnosis not present

## 2015-12-14 DIAGNOSIS — M542 Cervicalgia: Secondary | ICD-10-CM | POA: Diagnosis not present

## 2015-12-14 DIAGNOSIS — Z794 Long term (current) use of insulin: Secondary | ICD-10-CM

## 2015-12-14 DIAGNOSIS — E1159 Type 2 diabetes mellitus with other circulatory complications: Secondary | ICD-10-CM

## 2015-12-14 DIAGNOSIS — E1142 Type 2 diabetes mellitus with diabetic polyneuropathy: Secondary | ICD-10-CM | POA: Diagnosis not present

## 2015-12-14 DIAGNOSIS — E119 Type 2 diabetes mellitus without complications: Secondary | ICD-10-CM

## 2015-12-14 LAB — POCT GLYCOSYLATED HEMOGLOBIN (HGB A1C): Hemoglobin A1C: 7.3

## 2015-12-14 MED ORDER — COLCHICINE 0.6 MG PO TABS
ORAL_TABLET | ORAL | 4 refills | Status: DC
Start: 2015-12-14 — End: 2017-01-13

## 2015-12-14 MED ORDER — CYANOCOBALAMIN 1000 MCG/ML IJ SOLN
1000.0000 ug | Freq: Once | INTRAMUSCULAR | Status: AC
  Administered 2015-12-14: 1000 ug via INTRAMUSCULAR

## 2015-12-14 NOTE — Progress Notes (Signed)
Subjective:    Patient ID: Donald [name], male    DOB: November 22, 1945, 70 y.o.   MRN: [id number]  HPI Pt returns for f/u of diabetes mellitus: DM type: Insulin-requiring type 2 Dx'ed: 6578 Complications: polyneuropathy, CAD and renal insufficiency Therapy: insulin since 1995 DKA: never.  Severe hypoglycemia: never.  Pancreatitis: never Other: he takes multiple daily injections; he declines weight-loss surgery; he has severe insulin resistance. Interval history: he brings a record of his cbg's which i have reviewed today.  He checks fasting and in the afternoon.  It varies from 86-200.  There is no trend throughout the day.  He took prednisone last month, and steriod injection a few weeks ago Past Medical History:  Diagnosis Date  . ANEMIA-IRON DEFICIENCY 07/26/2006  . ASTHMA 07/26/2006  . Back pain, chronic   . Bell's palsy   . Chronic kidney disease    stage 3 renal failure  . COLONIC POLYPS, HX OF 07/26/2006  . D V T 12/03/2007  . DEPRESSION 03/14/2009  . DIABETES MELLITUS, TYPE II 07/26/2006  . [place] DISEASE, LUMBAR 10/05/2007  . DYSLIPIDEMIA 04/13/2009  . Edema    ankle  . GERD 07/26/2006  . Gout   . Heart murmur   . HYPERTENSION 07/26/2006  . INSOMNIA 08/21/2007  . Neuropathy ([place])   . OBSTRUCTIVE SLEEP APNEA 12/03/2007  . PERIPHERAL NEUROPATHY 07/26/2006  . Pernicious anemia 11/20/2006  . PULMONARY EMBOLISM 10/05/2007  . Renal insufficiency   . Shortness of breath dyspnea     Past Surgical History:  Procedure Laterality Date  . APPENDECTOMY  1968  . [place], 06/14/2008   Dr. Trenton Gammon at [facility] (06/10)  . CARDIAC CATHETERIZATION    . CARPAL TUNNEL RELEASE    . CATARACT EXTRACTION W/PHACO Left 05/30/2014   Procedure: CATARACT EXTRACTION PHACO AND INTRAOCULAR LENS PLACEMENT (IOC);  Surgeon: Estill Cotta, MD;  Location: [facility] ORS;  Service: Ophthalmology;  Laterality: Left;  [name] 01:20 AP% 23.7 CDE 31.86  . LITHOTRIPSY    . SHOULDER ARTHROSCOPY W/  ROTATOR CUFF REPAIR    . TONSILLECTOMY      Social History   Social History  . Marital status: Married    Spouse name: N/A  . Number of children: N/A  . Years of education: N/A   Occupational History  . Disabled Disability   Social History Main Topics  . Smoking status: Never Smoker  . Smokeless tobacco: Never Used  . Alcohol use No  . Drug use: No  . Sexual activity: Not on file   Other Topics Concern  . 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   Activity is severely limited by medical problems   Diet is "good".   Never a smoker   Alcohol: none          Current Outpatient Prescriptions on File Prior to Visit  Medication Sig Dispense Refill  . allopurinol (ZYLOPRIM) 300 MG tablet Take 300 mg by mouth daily.    Marland Kitchen ALPRAZolam (XANAX) 1 MG tablet TAKE ONE TABLET AT BEDTIME AS NEEDED FORSLEEP 30 tablet 3  . amLODipine (NORVASC) 5 MG tablet TAKE ONE TABLET AT BEDTIME 30 tablet 11  . aspirin 81 MG chewable tablet Chew 81 mg by mouth daily.    . cyanocobalamin (,VITAMIN B-12,) 1000 MCG/ML injection 1 injection every 3 months     . furosemide (LASIX) 80 MG tablet TAKE ONE  TABLET BY MOUTH EVERY DAY 90 tablet 3  . gabapentin (NEURONTIN) 600 MG tablet TAKE ONE TABLET 3 TIMES DAILY 90 tablet 11  . GE100 BLOOD GLUCOSE TEST test strip TEST 3-4 TIMES DAILY 120 each 11  . insulin lispro (HUMALOG) 100 UNIT/ML injection Inject into the skin 3 (three) times daily before meals. 200 units with breakfast 150 units with lunch and 260 units with the evening meal    . insulin NPH Human (HUMULIN N) 100 UNIT/ML injection Inject 3.1 mLs (310 Units total) into the skin at bedtime. 200 mL 1  . losartan (COZAAR) 100 MG tablet TAKE ONE TABLET BY MOUTH AT BEDTIME 90 tablet 3  . lovastatin (MEVACOR) 40 MG tablet TAKE TWO TABLETS AT BEDTIME 60 tablet 11  . Sennosides-Docusate Sodium (STOOL SOFTENER & LAXATIVE PO) Take 2 tablets  by mouth at bedtime.    . sildenafil (REVATIO) 20 MG tablet 1-5 pills, as needed 90 tablet 11  . VENTOLIN HFA 108 (90 BASE) MCG/ACT inhaler 2 puffs every 4-6 hours as needed for wheezing/shortness of breath     No current facility-administered medications on file prior to visit.     Allergies  Allergen Reactions  . Codeine     REACTION: chest pain  . Niacin-Lovastatin Er   . Pioglitazone     REACTION to Actos: swelling in ankles    Family History  Problem Relation Age of Onset  . Cancer Mother     Breast Cancer  . Cancer Sister     Breast Cancer    BP 132/70   Pulse 60   Ht 5\' 6"  (1.676 m)   Wt 276 lb (125.2 kg)   SpO2 97%   BMI 44.55 kg/m    Review of Systems He denies hypoglycemia.      Objective:   Physical Exam VITAL SIGNS:  See vs page GENERAL: no distress.  Pulses: dorsalis pedis intact bilat.  Feet: no deformity. feet are of normal color and temp.  CV: 2+ bilat leg edema.  Skin: no ulcer on the feet.  Neuro: sensation is intact to touch on the feet, but decreased from normal.    Lab Results  Component Value Date   CREATININE 1.60 (H) 04/07/2015   BUN 31 (H) 03/02/2015   NA 143 03/02/2015   K 3.7 03/02/2015   CL 104 03/02/2015   CO2 30 03/02/2015   A1c=7.3%    Assessment & Plan:  Insulin-requiring type 2 DM, with CAD: well-controlled.  low-back pain: steroids are affecting a1c, so we'll follow.    Subjective:   Patient here for Medicare annual wellness visit and management of other chronic and acute problems.     Risk factors: advanced age    5 of Physicians Providing Medical Care to Patient:  See "snapshot"   Activities of Daily Living: In your present state of health, do you have any difficulty performing the following activities (lives with wife)?:  Preparing food and eating?: No  Bathing yourself: No  Getting dressed: No  Using the toilet:No  Moving around from place to place: No  In the past year have you fallen or had a  near fall?: No    Home Safety: Has smoke detector and wears seat belts. No firearms. No excess sun exposure.  Diet and Exercise  Current exercise habits: limited by back pain Dietary issues discussed: pt reports a healthy diet   Depression Screen  Q1: Over the past two weeks, have you felt down, depressed or hopeless? no  Q2: Over the past two weeks, have you felt little interest or pleasure in doing things? no   The following portions of the patient's history were reviewed and updated as appropriate: allergies, current medications, past family history, past medical history, past social history, past surgical history and problem list.   Review of Systems  No change in chronic hearing and visual losses Objective:   Vision:  Advertising account executive, so he declines VA today Hearing: grossly normal Body mass index:  See vs page Msk: pt slowly performs "get-up-and-go" from a sitting position.   Cognitive Impairment Assessment: cognition, memory and judgment appear normal.  remembers 3/3 at 5 minutes.  excellent recall.  can easily read and write a sentence.  alert and oriented x 3.    Assessment:   Medicare wellness utd on preventive parameters    Plan:   During the course of the visit the patient was educated and counseled about appropriate screening and preventive services including:        Fall prevention   Diabetes screening  Nutrition counseling   Vaccines / LABS Zostavax / Pneumococcal Vaccine today   Patient Instructions (the written plan) was given to the patient.

## 2015-12-14 NOTE — Progress Notes (Signed)
we discussed code status.  pt requests full code, but would not want to be started or maintained on artificial life-support measures if there was not a reasonable chance of recovery 

## 2015-12-14 NOTE — Patient Instructions (Addendum)
check your blood sugar twice a day.  vary the time of day when you check, between before the 3 meals, and at bedtime.  also check if you have symptoms of your blood sugar being too high or too low.  please keep a record of the readings and bring it to your next appointment here.  You can write it on any piece of paper.  please call us sooner if your blood sugar goes below 70, or if you have a lot of readings over 200. good diet and exercise significantly improve the control of your diabetes.  please let me know if you wish to be referred to a dietician.  high blood sugar is very risky to your health.  you should see an eye doctor and dentist every year.  It is very important to get all recommended vaccinations.  Please consider these measures for your health:  minimize alcohol.  Do not use tobacco products.  Have a colonoscopy at least every 10 years from age 38. Keep firearms safely stored.  Always use seat belts.  have working smoke alarms in your home.  See an eye doctor and dentist regularly.  Never drive under the influence of alcohol or drugs (including prescription drugs).  Those with fair skin should take precautions against the sun, and should carefully examine their skin once per month, for any new or changed moles. It is critically important to prevent falling down (keep floor areas well-lit, dry, and free of loose objects.  If you have a cane, walker, or wheelchair, you should use it, even for short trips around the house.  Wear flat-soled shoes.  Also, try not to rush)  Please come back for a follow-up appointment in 4 months. Please continue the same insulins for now.

## 2015-12-19 ENCOUNTER — Ambulatory Visit: Payer: Medicare Other | Admitting: Endocrinology

## 2015-12-19 ENCOUNTER — Telehealth: Payer: Self-pay | Admitting: Endocrinology

## 2015-12-19 NOTE — Telephone Encounter (Signed)
[facility] is calling to get some extra information for Pt's prescriptions. Case # [id number] Case # [id number]

## 2015-12-20 ENCOUNTER — Other Ambulatory Visit: Payer: Self-pay

## 2015-12-20 MED ORDER — INSULIN NPH (HUMAN) (ISOPHANE) 100 UNIT/ML ~~LOC~~ SUSP: 310.0000 [IU] | Freq: Every day | SUBCUTANEOUS | 11 refills | Status: DC

## 2015-12-20 MED ORDER — INSULIN LISPRO 100 UNIT/ML ~~LOC~~ SOLN: SUBCUTANEOUS | 11 refills | Status: DC

## 2015-12-20 NOTE — Telephone Encounter (Signed)
I contacted [facility] and was advised they were in need of a hard copy rx for the humalog and humulin. Rx printed and faxed.

## 2015-12-25 ENCOUNTER — Telehealth: Payer: Self-pay | Admitting: Endocrinology

## 2015-12-25 NOTE — Telephone Encounter (Signed)
Pt Assistance Program called in and said they need verification of the insulin dosage for the patient.  Requests a call back at [phone]

## 2015-12-25 NOTE — Telephone Encounter (Signed)
Patient has been approved for the [facility] until the end of the calender year. Case ID [id number].

## 2015-12-26 ENCOUNTER — Other Ambulatory Visit: Payer: Self-pay

## 2015-12-26 MED ORDER — INSULIN LISPRO 100 UNIT/ML ~~LOC~~ SOLN: SUBCUTANEOUS | 11 refills | Status: DC

## 2015-12-26 NOTE — Telephone Encounter (Signed)
I contacted [employer] and was advised they needed a rx sent for the Humalog stating if the patient needed pen or vials. Per the patient request he would like to use vials. [facility] to [facility] and patient notified. Humulin has been approved and shipment has been sent.

## 2015-12-26 NOTE — Telephone Encounter (Signed)
Patient stated that [facility] need more information, updated prescription, medications Humalog, Humilin N, dosage and regimen.  Fax # [phone]

## 2015-12-29 ENCOUNTER — Telehealth: Payer: Self-pay | Admitting: Endocrinology

## 2015-12-29 NOTE — Telephone Encounter (Signed)
[facility] called, need clarification on the Humulog prescription for the patient. CB # [phone]

## 2015-12-29 NOTE — Telephone Encounter (Signed)
I contacted the [facility] and was advised they need a rx with the max dosage daily for the humalog. Rx submitted and was advised no further info was needed.

## 2016-01-03 DIAGNOSIS — Z6841 Body Mass Index (BMI) 40.0 and over, adult: Secondary | ICD-10-CM | POA: Diagnosis not present

## 2016-01-03 DIAGNOSIS — D509 Iron deficiency anemia, unspecified: Secondary | ICD-10-CM | POA: Diagnosis not present

## 2016-01-03 DIAGNOSIS — N200 Calculus of kidney: Secondary | ICD-10-CM | POA: Diagnosis not present

## 2016-01-03 DIAGNOSIS — N183 Chronic kidney disease, stage 3 (moderate): Secondary | ICD-10-CM | POA: Diagnosis not present

## 2016-01-03 DIAGNOSIS — I129 Hypertensive chronic kidney disease with stage 1 through stage 4 chronic kidney disease, or unspecified chronic kidney disease: Secondary | ICD-10-CM | POA: Diagnosis not present

## 2016-01-03 DIAGNOSIS — M1009 Idiopathic gout, multiple sites: Secondary | ICD-10-CM | POA: Diagnosis not present

## 2016-01-03 DIAGNOSIS — G4733 Obstructive sleep apnea (adult) (pediatric): Secondary | ICD-10-CM | POA: Diagnosis not present

## 2016-01-03 DIAGNOSIS — E1122 Type 2 diabetes mellitus with diabetic chronic kidney disease: Secondary | ICD-10-CM | POA: Diagnosis not present

## 2016-01-03 DIAGNOSIS — N2581 Secondary hyperparathyroidism of renal origin: Secondary | ICD-10-CM | POA: Diagnosis not present

## 2016-01-03 DIAGNOSIS — Z9989 Dependence on other enabling machines and devices: Secondary | ICD-10-CM | POA: Diagnosis not present

## 2016-01-04 ENCOUNTER — Telehealth: Payer: Self-pay | Admitting: Endocrinology

## 2016-01-04 ENCOUNTER — Telehealth: Payer: Self-pay

## 2016-01-04 NOTE — Telephone Encounter (Signed)
Pt calling to see if we have received the meds from [employer] yet Pt informed we have not received it yet

## 2016-01-04 NOTE — Telephone Encounter (Signed)
I contacted the patient and left a voicemail we have received the patient assistance for Humulin N from [employer]. Patient advised it is ready to be picked up.

## 2016-01-29 ENCOUNTER — Other Ambulatory Visit: Payer: Self-pay | Admitting: Otolaryngology

## 2016-01-29 DIAGNOSIS — E213 Hyperparathyroidism, unspecified: Secondary | ICD-10-CM

## 2016-01-29 DIAGNOSIS — R221 Localized swelling, mass and lump, neck: Secondary | ICD-10-CM | POA: Diagnosis not present

## 2016-01-29 DIAGNOSIS — H61899 Other specified disorders of external ear, unspecified ear: Secondary | ICD-10-CM | POA: Diagnosis not present

## 2016-01-29 DIAGNOSIS — L821 Other seborrheic keratosis: Secondary | ICD-10-CM | POA: Diagnosis not present

## 2016-02-05 ENCOUNTER — Encounter
Admission: RE | Admit: 2016-02-05 | Discharge: 2016-02-05 | Disposition: A | Discharge status: Home / Self Care | Payer: Medicare Other | Source: Ambulatory Visit | Attending: Otolaryngology | Admitting: Otolaryngology

## 2016-02-05 DIAGNOSIS — E213 Hyperparathyroidism, unspecified: Secondary | ICD-10-CM | POA: Insufficient documentation

## 2016-02-05 MED ORDER — TECHNETIUM TC 99M SESTAMIBI - CARDIOLITE
25.6700 | Freq: Once | INTRAVENOUS | Status: AC | PRN
  Administered 2016-02-05: 25.67 via INTRAVENOUS

## 2016-02-06 DIAGNOSIS — E210 Primary hyperparathyroidism: Secondary | ICD-10-CM | POA: Diagnosis not present

## 2016-02-14 ENCOUNTER — Encounter: Payer: Self-pay | Admitting: Endocrinology

## 2016-02-14 DIAGNOSIS — Z1212 Encounter for screening for malignant neoplasm of rectum: Secondary | ICD-10-CM | POA: Diagnosis not present

## 2016-02-14 DIAGNOSIS — Z1211 Encounter for screening for malignant neoplasm of colon: Secondary | ICD-10-CM | POA: Diagnosis not present

## 2016-02-15 ENCOUNTER — Encounter
Admission: RE | Admit: 2016-02-15 | Discharge: 2016-02-15 | Disposition: A | Discharge status: Home / Self Care | Payer: Medicare Other | Source: Ambulatory Visit | Attending: Otolaryngology | Admitting: Otolaryngology

## 2016-02-15 DIAGNOSIS — I10 Essential (primary) hypertension: Secondary | ICD-10-CM | POA: Diagnosis not present

## 2016-02-15 DIAGNOSIS — E119 Type 2 diabetes mellitus without complications: Secondary | ICD-10-CM | POA: Diagnosis not present

## 2016-02-15 DIAGNOSIS — Z01818 Encounter for other preprocedural examination: Secondary | ICD-10-CM | POA: Insufficient documentation

## 2016-02-15 HISTORY — DX: Anxiety disorder, unspecified: F41.9

## 2016-02-15 HISTORY — DX: Personal history of urinary calculi: Z87.442

## 2016-02-15 HISTORY — DX: Unspecified asthma, uncomplicated: J45.909

## 2016-02-15 LAB — CBC
HEMATOCRIT: 41.3 % (ref 40.0–52.0)
HEMOGLOBIN: 13.8 g/dL (ref 13.0–18.0)
MCH: 29.4 pg (ref 26.0–34.0)
MCHC: 33.5 g/dL (ref 32.0–36.0)
MCV: 87.6 fL (ref 80.0–100.0)
Platelets: 171 10*3/uL (ref 150–440)
RBC: 4.71 MIL/uL (ref 4.40–5.90)
RDW: 15.7 % — AB (ref 11.5–14.5)
WBC: 7.3 10*3/uL (ref 3.8–10.6)

## 2016-02-15 LAB — BASIC METABOLIC PANEL
Anion gap: 7 (ref 5–15)
BUN: 26 mg/dL — AB (ref 6–20)
CALCIUM: 10.8 mg/dL — AB (ref 8.9–10.3)
CHLORIDE: 104 mmol/L (ref 101–111)
CO2: 32 mmol/L (ref 22–32)
CREATININE: 1.37 mg/dL — AB (ref 0.61–1.24)
GFR calc Af Amer: 60 mL/min — ABNORMAL LOW (ref >60)
GFR calc non Af Amer: 51 mL/min — ABNORMAL LOW (ref >60)
GLUCOSE: 119 mg/dL — AB (ref 65–99)
Potassium: 4.2 mmol/L (ref 3.5–5.1)
Sodium: 143 mmol/L (ref 135–145)

## 2016-02-15 NOTE — Patient Instructions (Signed)
  Your procedure is scheduled on: February 29 2016 ( Thursday) Report to Same Day Surgery 2nd floor medical mall [facility] Entrance-take elevator on left to 2nd floor.  Check in with surgery information desk.) To find out your arrival time please call [phone] between 1PM - 3PM on February 28, 2016 (Wednesday)  Remember: Instructions that are not followed completely may result in serious medical risk, up to and including death, or upon the discretion of your surgeon and anesthesiologist your surgery may need to be rescheduled.    _x___ 1. Do not eat food or drink liquids after midnight. No gum chewing or hard candies.     __x__ 2. No Alcohol for 24 hours before or after surgery.   __x__3. No Smoking for 24 prior to surgery.   ____  4. Bring all medications with you on the day of surgery if instructed.    __x__ 5. Notify your doctor if there is any change in your medical condition     (cold, fever, infections).     Do not wear jewelry, make-up, hairpins, clips or nail polish.  Do not wear lotions, powders, or perfumes. You may wear deodorant.  Do not shave 48 hours prior to surgery. Men may shave face and neck.  Do not bring valuables to the hospital.    [facility] is not responsible for any belongings or valuables.               Contacts, dentures or bridgework may not be worn into surgery.  Leave your suitcase in the car. After surgery it may be brought to your room.  For patients admitted to the hospital, discharge time is determined by your treatment team.   Patients discharged the day of surgery will not be allowed to drive home.  You will need someone to drive you home and stay with you the night of your procedure.    Please read over the following fact sheets that you were given:   [facility] Preparing for Surgery and or MRSA Information   _x___ Take these medicines the morning of surgery with A SIP OF WATER:    1. Gabapentin  2.  3.  4.  5.  6.  ____Fleets  enema or Magnesium Citrate as directed.   __ Use CHG Soap or sage wipes as directed on instruction sheet   ____ Use inhalers on the day of surgery and bring to hospital day of surgery  ____ Stop metformin 2 days prior to surgery    __x__ Take 1/2 of usual insulin dose the night before surgery and none on the morning of surgery     (TAKE ONE-HALF OF HUMULIN N THE NIGHT BEFORE SURGERY AND NO INSULIN THE MORNING OF SURGERY)       __x__ Stop Aspirin, Coumadin, Pllavix ,Eliquis, Effient, or Pradaxa (Stop Aspirin one week prior to surgery  x__ Stop Anti-inflammatories such as Advil, Aleve, Ibuprofen, Motrin, Naproxen,          Naprosyn, Goodies powders or aspirin products. Ok to take Tylenol.   ____ Stop supplements until after surgery.    __x__ Bring C-Pap to the hospital.

## 2016-02-19 ENCOUNTER — Other Ambulatory Visit: Payer: Self-pay | Admitting: Endocrinology

## 2016-02-19 ENCOUNTER — Encounter: Payer: Self-pay | Admitting: Oncology

## 2016-02-19 ENCOUNTER — Inpatient Hospital Stay: Payer: Medicare Other | Attending: Oncology | Admitting: Oncology

## 2016-02-19 VITALS — BP 152/69 | HR 57 | Temp 98.2°F | Resp 18 | Ht 66.5 in | Wt 280.6 lb

## 2016-02-19 DIAGNOSIS — N400 Enlarged prostate without lower urinary tract symptoms: Secondary | ICD-10-CM | POA: Diagnosis not present

## 2016-02-19 DIAGNOSIS — I129 Hypertensive chronic kidney disease with stage 1 through stage 4 chronic kidney disease, or unspecified chronic kidney disease: Secondary | ICD-10-CM | POA: Diagnosis not present

## 2016-02-19 DIAGNOSIS — Z79899 Other long term (current) drug therapy: Secondary | ICD-10-CM | POA: Insufficient documentation

## 2016-02-19 DIAGNOSIS — E213 Hyperparathyroidism, unspecified: Secondary | ICD-10-CM | POA: Insufficient documentation

## 2016-02-19 DIAGNOSIS — R234 Changes in skin texture: Secondary | ICD-10-CM | POA: Diagnosis not present

## 2016-02-19 DIAGNOSIS — I517 Cardiomegaly: Secondary | ICD-10-CM | POA: Insufficient documentation

## 2016-02-19 DIAGNOSIS — G629 Polyneuropathy, unspecified: Secondary | ICD-10-CM | POA: Insufficient documentation

## 2016-02-19 DIAGNOSIS — J45909 Unspecified asthma, uncomplicated: Secondary | ICD-10-CM | POA: Insufficient documentation

## 2016-02-19 DIAGNOSIS — H9190 Unspecified hearing loss, unspecified ear: Secondary | ICD-10-CM | POA: Diagnosis not present

## 2016-02-19 DIAGNOSIS — F419 Anxiety disorder, unspecified: Secondary | ICD-10-CM | POA: Insufficient documentation

## 2016-02-19 DIAGNOSIS — Z87442 Personal history of urinary calculi: Secondary | ICD-10-CM | POA: Insufficient documentation

## 2016-02-19 DIAGNOSIS — F329 Major depressive disorder, single episode, unspecified: Secondary | ICD-10-CM | POA: Diagnosis not present

## 2016-02-19 DIAGNOSIS — D510 Vitamin B12 deficiency anemia due to intrinsic factor deficiency: Secondary | ICD-10-CM | POA: Insufficient documentation

## 2016-02-19 DIAGNOSIS — R948 Abnormal results of function studies of other organs and systems: Secondary | ICD-10-CM | POA: Diagnosis not present

## 2016-02-19 DIAGNOSIS — K219 Gastro-esophageal reflux disease without esophagitis: Secondary | ICD-10-CM | POA: Diagnosis not present

## 2016-02-19 DIAGNOSIS — N183 Chronic kidney disease, stage 3 (moderate): Secondary | ICD-10-CM | POA: Diagnosis not present

## 2016-02-19 DIAGNOSIS — E1122 Type 2 diabetes mellitus with diabetic chronic kidney disease: Secondary | ICD-10-CM | POA: Diagnosis not present

## 2016-02-19 DIAGNOSIS — G4733 Obstructive sleep apnea (adult) (pediatric): Secondary | ICD-10-CM | POA: Insufficient documentation

## 2016-02-19 DIAGNOSIS — Z794 Long term (current) use of insulin: Secondary | ICD-10-CM | POA: Insufficient documentation

## 2016-02-19 DIAGNOSIS — M109 Gout, unspecified: Secondary | ICD-10-CM | POA: Diagnosis not present

## 2016-02-19 DIAGNOSIS — I700 Atherosclerosis of aorta: Secondary | ICD-10-CM | POA: Insufficient documentation

## 2016-02-19 DIAGNOSIS — N529 Male erectile dysfunction, unspecified: Secondary | ICD-10-CM | POA: Insufficient documentation

## 2016-02-19 DIAGNOSIS — Z7982 Long term (current) use of aspirin: Secondary | ICD-10-CM | POA: Insufficient documentation

## 2016-02-19 DIAGNOSIS — K5730 Diverticulosis of large intestine without perforation or abscess without bleeding: Secondary | ICD-10-CM | POA: Insufficient documentation

## 2016-02-19 DIAGNOSIS — Z86711 Personal history of pulmonary embolism: Secondary | ICD-10-CM | POA: Diagnosis not present

## 2016-02-19 DIAGNOSIS — E785 Hyperlipidemia, unspecified: Secondary | ICD-10-CM | POA: Diagnosis not present

## 2016-02-19 DIAGNOSIS — R946 Abnormal results of thyroid function studies: Secondary | ICD-10-CM

## 2016-02-19 DIAGNOSIS — G4700 Insomnia, unspecified: Secondary | ICD-10-CM | POA: Insufficient documentation

## 2016-02-19 LAB — COLOGUARD

## 2016-02-19 NOTE — Progress Notes (Signed)
Hematology/Oncology Consult note [facility] Telephone:([phone] [phone]  Patient Care Team: Renato Shin, MD as PCP - General Suella Broad, MD (Physical Medicine and Rehabilitation) Earnie Larsson, MD as Attending Physician (Neurosurgery) Rigoberto Noel, MD as Consulting Physician (Pulmonary Disease) Estill Cotta, MD (Ophthalmology)   Name of the patient: Donald [name]  [id number]  04-01-1945    Reason for referral- abnormal parathyroid scan   Referring physician- Dr. [name]  Date of visit: 02/19/16   History of presenting illness- patient is a 71 year old male in was evaluated by nephrology for chronic kidney disease and was found to have hypercalcemia. Patient has been seeing Dr. Pryor Ochoa for his sinus issues and hearing loss for a while. There was a concern for a cystic neck mass which was aspirated and according to patient and the fluid was negative for malignancy. Ultrasound of the thyroid showed 2 cystic nodules in the right thyroid lobe. These measured 1.2 cm but did not meet criteria for biopsy. CT soft tissue of the neck did not reveal any abnormality. Patient underwent parathyroid spect scan which showed a focal abnormal sestamibi location suspicious for right inferior parathyroid adenoma. Nonspecific tracer localization within single normal-sized left axillary and right subpectoral lymph node. Patient is scheduled for an elective parathyroidectomy on 02/29/2016. He has been referred to [name] for uptake noted in his lymph nodes  Patient reports chronic back pain secondary to disc issues. Denies any unintentional weight loss, loss of appetite, drenching night sweats. Recent CBC from 02/15/2016 was within normal limits. Denies any family history of thyroid, parathyroid, adrenal gland issues. Denies any episodes of headache, palpitations or uncontrolled blood pressures suggestive of pheochromocytoma.  ECOG PS- 1  Pain scale-  3   Review of systems- Review of Systems  Constitutional: Negative for chills, fever, malaise/fatigue and weight loss.  HENT: Negative for congestion, ear discharge and nosebleeds.   Eyes: Negative for blurred vision.  Respiratory: Negative for cough, hemoptysis, sputum production, shortness of breath and wheezing.   Cardiovascular: Negative for chest pain, palpitations, orthopnea and claudication.  Gastrointestinal: Negative for abdominal pain, blood in stool, constipation, diarrhea, heartburn, melena, nausea and vomiting.  Genitourinary: Negative for dysuria, flank pain, frequency, hematuria and urgency.  Musculoskeletal: Positive for back pain. Negative for joint pain and myalgias.  Skin: Negative for rash.  Neurological: Negative for dizziness, tingling, focal weakness, seizures, weakness and headaches.  Endo/Heme/Allergies: Does not bruise/bleed easily.  Psychiatric/Behavioral: Negative for depression and suicidal ideas. The patient does not have insomnia.     Allergies  Allergen Reactions  . Codeine     REACTION: chest pain  . Niacin-Lovastatin Er     This is unknown to patient and he currently takes lovastatin  . Pioglitazone     REACTION to Actos: swelling in ankles    Patient Active Problem List   Diagnosis Date Noted  . Back pain 10/09/2015  . Loss of hearing 05/30/2015  . Diabetes ([place]) 03/02/2015  . Renal insufficiency 01/27/2014  . Pain in joint, ankle and foot 10/08/2013  . Morbid obesity ([place]) 03/08/2013  . Encounter for long-term (current) use of other medications 10/08/2012  . Screening examination for infectious disease 12/25/2011  . DM (diabetes mellitus) ([place]) 09/18/2011  . Screening for prostate cancer 06/06/2011  . Impotence of organic origin 03/11/2011  . CAD (coronary artery disease) 11/19/2010  . URINARY CALCULUS 03/26/2010  . HEMATURIA UNSPECIFIED 02/27/2010  . Dyslipidemia 04/13/2009  . ALLERGIC RHINITIS 04/13/2009  . DEPRESSION 03/14/2009  .  SKIN TAG 02/06/2009  . PROTEINURIA, MILD 09/16/2008  . D V T 12/03/2007  . OSA (obstructive sleep apnea) 12/03/2007  . PULMONARY EMBOLISM 10/05/2007  . [place] DISEASE, LUMBAR 10/05/2007  . FOOT PAIN, LEFT 08/21/2007  . INSOMNIA 08/21/2007  . PERNICIOUS ANEMIA 11/20/2006  . Iron deficiency anemia 07/26/2006  . PERIPHERAL NEUROPATHY 07/26/2006  . Essential hypertension 07/26/2006  . Asthmatic bronchitis 07/26/2006  . GERD 07/26/2006  . COLONIC POLYPS, HX OF 07/26/2006     Past Medical History:  Diagnosis Date  . ANEMIA-IRON DEFICIENCY 07/26/2006  . Anxiety   . ASTHMA 07/26/2006  . Asthma   . Back pain, chronic   . Bell's palsy   . Chronic kidney disease    stage 3 renal failure  . COLONIC POLYPS, HX OF 07/26/2006  . D V T 12/03/2007  . DEPRESSION 03/14/2009  . DIABETES MELLITUS, TYPE II 07/26/2006  . [place] DISEASE, LUMBAR 10/05/2007  . DYSLIPIDEMIA 04/13/2009  . Edema    ankle  . GERD 07/26/2006  . Gout   . Heart murmur   . History of kidney stones   . HYPERTENSION 07/26/2006  . INSOMNIA 08/21/2007  . Neuropathy ([place])   . OBSTRUCTIVE SLEEP APNEA 12/03/2007   Use C-PAP  . PERIPHERAL NEUROPATHY 07/26/2006  . Pernicious anemia 11/20/2006  . PULMONARY EMBOLISM 10/05/2007  . Renal insufficiency   . Shortness of breath dyspnea      Past Surgical History:  Procedure Laterality Date  . APPENDECTOMY  1968  . [place], 06/14/2008   Dr. Trenton Gammon at [facility] (06/10)  . CARDIAC CATHETERIZATION    . CARPAL TUNNEL RELEASE Right   . CATARACT EXTRACTION W/PHACO Left 05/30/2014   Procedure: CATARACT EXTRACTION PHACO AND INTRAOCULAR LENS PLACEMENT (IOC);  Surgeon: Estill Cotta, MD;  Location: [facility] ORS;  Service: Ophthalmology;  Laterality: Left;  [name] 01:20 AP% 23.7 CDE 31.86  . EYE SURGERY Left    Catarct Extraction with IOL  . LITHOTRIPSY     X 2  . SHOULDER ARTHROSCOPY W/ ROTATOR CUFF REPAIR Right   . TONSILLECTOMY      Social History   Social History  . Marital  status: Married    Spouse name: N/A  . Number of children: N/A  . Years of education: N/A   Occupational History  . Disabled Disability   Social History Main Topics  . Smoking status: Never Smoker  . Smokeless tobacco: Never Used  . Alcohol use No  . Drug use: No  . Sexual activity: Not on file   Other Topics Concern  . 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   Activity is severely limited by medical problems   Diet is "good".   Never a smoker   Alcohol: none           Family History  Problem Relation Age of Onset  . Cancer Mother     Breast Cancer  . Cancer Sister     Breast Cancer  . Diabetes Father   . Heart disease Father   . Diabetes Paternal Grandfather      Current Outpatient Prescriptions:  .  allopurinol (ZYLOPRIM) 300 MG tablet, Take 300 mg by mouth at bedtime. , Disp: , Rfl:  .  ALPRAZolam (XANAX) 1 MG tablet, TAKE ONE TABLET AT BEDTIME AS NEEDED FORSLEEP (Patient taking differently: Take 0.5 mg by mouth at bedtime as needed. ), Disp:  30 tablet, Rfl: 3 .  amLODipine (NORVASC) 5 MG tablet, TAKE ONE TABLET AT BEDTIME, Disp: 30 tablet, Rfl: 11 .  aspirin 81 MG chewable tablet, Chew 81 mg by mouth daily., Disp: , Rfl:  .  cyanocobalamin (,VITAMIN B-12,) 1000 MCG/ML injection, Inject 1,000 mcg into the muscle See admin instructions. 1 injection every 3 months , Disp: , Rfl:  .  furosemide (LASIX) 80 MG tablet, TAKE ONE TABLET BY MOUTH EVERY DAY, Disp: 90 tablet, Rfl: 3 .  gabapentin (NEURONTIN) 600 MG tablet, TAKE ONE TABLET 3 TIMES DAILY (Patient taking differently: TAKE ONE TABLET 2 TIMES DAILY), Disp: 90 tablet, Rfl: 11 .  GE100 BLOOD GLUCOSE TEST test strip, TEST 3-4 TIMES DAILY, Disp: 120 each, Rfl: 11 .  HYDROcodone-acetaminophen (NORCO) 10-325 MG tablet, Take 1 tablet by mouth every 4 (four) hours as needed for moderate pain., Disp: , Rfl:  .  insulin lispro (HUMALOG) 100 UNIT/ML  injection, 200 units with breakfast 150 units with lunch and 260 units with the supper** VIAL** (Patient taking differently: 200 units with breakfast 150 units with lunch and 200 units with the supper** VIAL**), Disp: 17 vial, Rfl: 11 .  insulin NPH Human (HUMULIN N) 100 UNIT/ML injection, Inject 3.1 mLs (310 Units total) into the skin at bedtime., Disp: 10 vial, Rfl: 11 .  losartan (COZAAR) 100 MG tablet, TAKE ONE TABLET BY MOUTH AT BEDTIME, Disp: 90 tablet, Rfl: 3 .  lovastatin (MEVACOR) 40 MG tablet, TAKE TWO TABLETS AT BEDTIME, Disp: 60 tablet, Rfl: 11 .  Naphazoline-Pheniramine (OPCON-A OP), Place 1 drop into both eyes 2 (two) times daily as needed., Disp: , Rfl:  .  Sennosides-Docusate Sodium (STOOL SOFTENER & LAXATIVE PO), Take 2 tablets by mouth at bedtime., Disp: , Rfl:  .  colchicine (COLCRYS) 0.6 MG tablet, 1 tab every hour until symptoms are better, max of 6 per day.  Stop if diarrhea. (Patient not taking: Reported on 02/19/2016), Disp: 30 tablet, Rfl: 4 .  sildenafil (REVATIO) 20 MG tablet, 1-5 pills, as needed (Patient not taking: Reported on 02/19/2016), Disp: 90 tablet, Rfl: 11 .  VENTOLIN HFA 108 (90 BASE) MCG/ACT inhaler, 2 puffs every 4-6 hours as needed for wheezing/shortness of breath, Disp: , Rfl:    Physical exam:  Vitals:   02/19/16 1352  BP: (!) 152/69  Pulse: (!) 57  Resp: 18  Temp: 98.2 F (36.8 C)  TempSrc: Tympanic  Weight: 280 lb 10.3 oz (127.3 kg)  Height: 5' 6.5" (1.689 m)   Physical Exam  Constitutional: He is oriented to person, place, and time.  Patient is obese but does not appear to be in any acute distress  HENT:  Head: Normocephalic and atraumatic.  Eyes: EOM are normal. Pupils are equal, round, and reactive to light.  Neck: Normal range of motion.  Cardiovascular: Normal rate, regular rhythm and normal heart sounds.   Pulmonary/Chest: Effort normal and breath sounds normal.  Abdominal: Soft. Bowel sounds are normal.  Lymphadenopathy:  No palpable  cervical axillary or inguinal adenopathy  Neurological: He is alert and oriented to person, place, and time.  Skin: Skin is warm and dry.       CMP Latest Ref Rng & Units 02/15/2016  Glucose 65 - 99 mg/dL 119(H)  BUN 6 - 20 mg/dL 26(H)  Creatinine 0.61 - 1.24 mg/dL 1.37(H)  Sodium 135 - 145 mmol/L 143  Potassium 3.5 - 5.1 mmol/L 4.2  Chloride 101 - 111 mmol/L 104  CO2 22 - 32 mmol/L 32  Calcium 8.9 - 10.3 mg/dL 10.8(H)  Total Protein 6.0 - 8.3 g/dL -  Total Bilirubin 0.2 - 1.2 mg/dL -  Alkaline Phos 39 - 117 U/L -  AST 0 - 37 U/L -  ALT 0 - 53 U/L -   CBC Latest Ref Rng & Units 02/15/2016  WBC 3.8 - 10.6 K/uL 7.3  Hemoglobin 13.0 - 18.0 g/dL 13.8  Hematocrit 40.0 - 52.0 % 41.3  Platelets 150 - 440 K/uL 171    No images are attached to the encounter.  Nm Parathyroid W/spect  Result Date: 02/05/2016 CLINICAL DATA:  Hyperparathyroidism, type II diabetes mellitus, hypertension, coronary artery disease EXAM: NM PARATHYROID SCINTIGRAPHY SPECT/CT IMAGING TECHNIQUE: Following intravenous administration of radiopharmaceutical, 2-hour delayed triplanar SPECT images were obtained as well correlative CT imaging. CT and SPECT images were reviewed independently as well as in fused data sets. RADIOPHARMACEUTICALS:  25.67 mCi Tc-30m Sestamibi IV COMPARISON:  None FINDINGS: Abnormal sestamibi localization is seen at the inferior RIGHT thyroid lobe region suspicious for an inferior RIGHT parathyroid adenoma. No other abnormal sestamibi accumulation is seen at either thyroid lobe. No ectopic localization of sestamibi within the mediastinum. Focal sestamibi localization is seen at small normal sized LEFT axillary and RIGHT subpectoral lymph nodes, nonspecific. IMPRESSION: Focal abnormal sestamibi localization suspicious for a RIGHT inferior parathyroid adenoma. Nonspecific tracer localization within single normal sized LEFT axillary and RIGHT subpectoral lymph nodes. Electronically Signed   By: Lavonia Dana  M.D.   On: 02/05/2016 14:30    Assessment and plan- Patient is a 71 y.o. male who has been referred to [name] for incidentally noted nonspecific uptake in his axillary and subpectoral lymph nodes  1. Patient currently does not have any B symptoms, has no palpable lymphadenopathy and has a normal CBC. Nonspecific uptake noted in axillary and subpectoral lymph nodes is probably not clinically significant given the normal size and appearance of these lymph nodes. However I would like to discuss his scans at the multidisciplinary tumor board this week and decide if a dedicated CT chest would be needed to assess these lymph nodes better. I will call the patient once I have recommendations from the tumor over regarding further tests. I do feel that the patient can proceed for a parathyroidectomy at this time from my standpoint.    Thank you for this kind referral and the opportunity to participate in the care of this patient   Visit Diagnosis 1. Abnormal radionuclide scan of parathyroid gland     Dr. Randa Evens, MD, MPH [facility] Pager- [phone] 02/19/2016 2:38 PM

## 2016-02-19 NOTE — Pre-Procedure Instructions (Signed)
Dr. Kayleen Memos reviewed pre-op EKG performed on February 15, 2016 and requested medical clearance.

## 2016-02-19 NOTE — Pre-Procedure Instructions (Signed)
Medical clearance request  faxed and called to Dr. Pryor Ochoa office (spoke to Marianne).

## 2016-02-22 ENCOUNTER — Telehealth: Payer: Self-pay | Admitting: Endocrinology

## 2016-02-22 NOTE — Telephone Encounter (Signed)
Becky from Dr Pryor Ochoa Office (ENT) is calling on the status of a fax that was sent over, concerning patient surgery. Please advise [phone]

## 2016-02-23 NOTE — Telephone Encounter (Signed)
See message and please advise, Thanks!  

## 2016-02-23 NOTE — Telephone Encounter (Signed)
We are having to call pt in for a visit, for this.

## 2016-02-23 NOTE — Telephone Encounter (Signed)
Attempted to reach the patient. 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 Will try again at a later time to schedule an appointment.

## 2016-02-23 NOTE — Pre-Procedure Instructions (Signed)
Message left on nurses line for an update on medical clearance request from Dr. Alvin Critchley.

## 2016-02-23 NOTE — Pre-Procedure Instructions (Signed)
Spoke with Justice Rocher at Dr. Darien Ramus office regarding medical clearance.  EKG was sent to pt's PCP Dr. Loanne Drilling for review.

## 2016-02-26 NOTE — Telephone Encounter (Signed)
Patient contacted and advised of message. Patient scheduled for 02/27/2015.

## 2016-02-26 NOTE — Telephone Encounter (Signed)
Surgery is on Thursday.

## 2016-02-27 ENCOUNTER — Ambulatory Visit: Payer: Medicare Other | Admitting: Endocrinology

## 2016-02-27 ENCOUNTER — Telehealth: Payer: Self-pay | Admitting: *Deleted

## 2016-02-27 ENCOUNTER — Other Ambulatory Visit: Payer: Self-pay | Admitting: *Deleted

## 2016-02-27 DIAGNOSIS — R9389 Abnormal findings on diagnostic imaging of other specified body structures: Secondary | ICD-10-CM

## 2016-02-27 NOTE — Telephone Encounter (Signed)
States he though t he was to hear from Dr Janese Banks or her nurse Thursday regarding what is to be done about his lymph nodes after discussing in group. Also states he is having surgery Thursday. Please return his call [phone]

## 2016-02-27 NOTE — Telephone Encounter (Signed)
Called pt to let him know that we will be discussing his case at case conference and then call him back this week Thursday afternoon. He should go ahead with surgery and we will call late afternoon, he states that I can leave him a message if he is not home.

## 2016-02-27 NOTE — Telephone Encounter (Signed)
I called him back in a separate telephone encounter

## 2016-02-28 ENCOUNTER — Encounter: Payer: Self-pay | Admitting: Endocrinology

## 2016-02-28 ENCOUNTER — Ambulatory Visit (INDEPENDENT_AMBULATORY_CARE_PROVIDER_SITE_OTHER): Payer: Medicare Other | Admitting: Endocrinology

## 2016-02-28 VITALS — BP 132/60 | HR 59 | Ht 66.5 in | Wt 279.0 lb

## 2016-02-28 DIAGNOSIS — N183 Chronic kidney disease, stage 3 (moderate): Secondary | ICD-10-CM

## 2016-02-28 DIAGNOSIS — R9431 Abnormal electrocardiogram [ECG] [EKG]: Secondary | ICD-10-CM | POA: Diagnosis not present

## 2016-02-28 DIAGNOSIS — E1122 Type 2 diabetes mellitus with diabetic chronic kidney disease: Secondary | ICD-10-CM

## 2016-02-28 DIAGNOSIS — Z794 Long term (current) use of insulin: Secondary | ICD-10-CM | POA: Diagnosis not present

## 2016-02-28 LAB — POCT GLYCOSYLATED HEMOGLOBIN (HGB A1C): Hemoglobin A1C: 7

## 2016-02-28 MED ORDER — INSULIN LISPRO 100 UNIT/ML ~~LOC~~ SOLN: SUBCUTANEOUS | 11 refills | Status: DC

## 2016-02-28 MED ORDER — INSULIN NPH (HUMAN) (ISOPHANE) 100 UNIT/ML ~~LOC~~ SUSP: 280.0000 [IU] | Freq: Every day | SUBCUTANEOUS | 11 refills | Status: DC

## 2016-02-28 NOTE — Patient Instructions (Addendum)
Please change the insulin to the numbers listed below.   Please cancel your heart surgery, as it is not safe to undergo now.   Please see a heart specialist.  you will receive a phone call, about a day and time for an appointment check your blood sugar 4 times a day; before the 3 meals, and at bedtime.  also check if you have symptoms of your blood sugar being too high or too low.  please keep a record of the readings and bring it to your next appointment here (or you can bring the meter itself).  You can write it on any piece of paper.  please call us sooner if your blood sugar goes below 70, or if you have a lot of readings over 200.   Please come back for a follow-up appointment in 4 months.

## 2016-02-28 NOTE — Progress Notes (Signed)
Subjective:    Patient ID: Donald [name], male    DOB: Mar 29, 1945, 71 y.o.   MRN: [id number]  HPI Pt returns for f/u of diabetes mellitus: DM type: Insulin-requiring type 2 Dx'ed: 3295 Complications: polyneuropathy, CAD and renal insufficiency Therapy: insulin since 1995 DKA: never.  Severe hypoglycemia: never.  Pancreatitis: never.  Other: he takes multiple daily injections; he declines weight-loss surgery; he has severe insulin resistance.   Interval history: no cbg record, but states cbg's vary from 54-188.  It is in general higher as the day goes on.  He is scheduled for parathyroid surgery for tomorrow.   Past Medical History:  Diagnosis Date  . ANEMIA-IRON DEFICIENCY 07/26/2006  . Anxiety   . ASTHMA 07/26/2006  . Asthma   . Back pain, chronic   . Bell's palsy   . Chronic kidney disease    stage 3 renal failure  . COLONIC POLYPS, HX OF 07/26/2006  . D V T 12/03/2007  . DEPRESSION 03/14/2009  . DIABETES MELLITUS, TYPE II 07/26/2006  . [place] DISEASE, LUMBAR 10/05/2007  . DYSLIPIDEMIA 04/13/2009  . Edema    ankle  . GERD 07/26/2006  . Gout   . Heart murmur   . History of kidney stones   . HYPERTENSION 07/26/2006  . INSOMNIA 08/21/2007  . Neuropathy ([place])   . OBSTRUCTIVE SLEEP APNEA 12/03/2007   Use C-PAP  . PERIPHERAL NEUROPATHY 07/26/2006  . Pernicious anemia 11/20/2006  . PULMONARY EMBOLISM 10/05/2007  . Renal insufficiency   . Shortness of breath dyspnea     Past Surgical History:  Procedure Laterality Date  . APPENDECTOMY  1968  . [place], 06/14/2008   Dr. Trenton Gammon at [facility] (06/10)  . CARDIAC CATHETERIZATION    . CARPAL TUNNEL RELEASE Right   . CATARACT EXTRACTION W/PHACO Left 05/30/2014   Procedure: CATARACT EXTRACTION PHACO AND INTRAOCULAR LENS PLACEMENT (IOC);  Surgeon: Estill Cotta, MD;  Location: [facility] ORS;  Service: Ophthalmology;  Laterality: Left;  [name] 01:20 AP% 23.7 CDE 31.86  . EYE SURGERY Left    Catarct Extraction with IOL  .  LITHOTRIPSY     X 2  . SHOULDER ARTHROSCOPY W/ ROTATOR CUFF REPAIR Right   . TONSILLECTOMY      Social History   Social History  . Marital status: Married    Spouse name: N/A  . Number of children: N/A  . Years of education: N/A   Occupational History  . Disabled Disability   Social History Main Topics  . Smoking status: Never Smoker  . Smokeless tobacco: Never Used  . Alcohol use No  . Drug use: No  . Sexual activity: Not on file   Other Topics Concern  . 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   Activity is severely limited by medical problems   Diet is "good".   Never a smoker   Alcohol: none          Current Outpatient Prescriptions on File Prior to Visit  Medication Sig Dispense Refill  . allopurinol (ZYLOPRIM) 300 MG tablet TAKE ONE TABLET BY MOUTH EVERY DAY 30 tablet 2  . ALPRAZolam (XANAX) 1 MG tablet TAKE ONE TABLET AT BEDTIME AS NEEDED FORSLEEP (Patient taking differently: Take 0.5 mg by mouth at bedtime as needed. ) 30 tablet 3  . amLODipine (NORVASC) 5 MG tablet TAKE ONE TABLET AT BEDTIME 30 tablet 11  .  aspirin 81 MG chewable tablet Chew 81 mg by mouth daily.    . colchicine (COLCRYS) 0.6 MG tablet 1 tab every hour until symptoms are better, max of 6 per day.  Stop if diarrhea. (Patient not taking: Reported on 02/19/2016) 30 tablet 4  . cyanocobalamin (,VITAMIN B-12,) 1000 MCG/ML injection Inject 1,000 mcg into the muscle See admin instructions. 1 injection every 3 months     . furosemide (LASIX) 80 MG tablet TAKE ONE TABLET BY MOUTH EVERY DAY 90 tablet 3  . gabapentin (NEURONTIN) 600 MG tablet TAKE ONE TABLET 3 TIMES DAILY (Patient taking differently: TAKE ONE TABLET 2 TIMES DAILY) 90 tablet 11  . GE100 BLOOD GLUCOSE TEST test strip TEST 3-4 TIMES DAILY 120 each 11  . HYDROcodone-acetaminophen (NORCO) 10-325 MG tablet Take 1 tablet by mouth every 4 (four) hours as needed for  moderate pain.    Marland Kitchen losartan (COZAAR) 100 MG tablet TAKE ONE TABLET BY MOUTH AT BEDTIME 90 tablet 3  . lovastatin (MEVACOR) 40 MG tablet TAKE TWO TABLETS AT BEDTIME 60 tablet 11  . Naphazoline-Pheniramine (OPCON-A OP) Place 1 drop into both eyes 2 (two) times daily as needed.    [name] Sodium (STOOL SOFTENER & LAXATIVE PO) Take 2 tablets by mouth at bedtime.    . sildenafil (REVATIO) 20 MG tablet 1-5 pills, as needed (Patient not taking: Reported on 02/19/2016) 90 tablet 11   No current facility-administered medications on file prior to visit.     Allergies  Allergen Reactions  . Codeine     REACTION: chest pain  . Niacin-Lovastatin Er     This is unknown to patient and he currently takes lovastatin  . Pioglitazone     REACTION to Actos: swelling in ankles    Family History  Problem Relation Age of Onset  . Cancer Mother     Breast Cancer  . Cancer Sister     Breast Cancer  . Diabetes Father   . Heart disease Father   . Diabetes Paternal Grandfather     BP 132/60   Pulse (!) 59   Ht 5' 6.5" (1.689 m)   Wt 279 lb (126.6 kg)   SpO2 98%   BMI 44.36 kg/m    Review of Systems Denies LOC, chest pain, and sob.     Objective:   Physical Exam VS: see vs page GEN: no distress.  Morbid obesity HEAD: head: no deformity eyes: no periorbital swelling, no proptosis.  external nose and ears are normal mouth: no lesion seen NECK: supple, thyroid is not enlarged CHEST WALL: no deformity LUNGS: clear to auscultation CV: reg rate and rhythm, with soft systolic murmur.   MUSCULOSKELETAL: muscle bulk and strength are grossly normal.  no obvious joint swelling.  gait is steady with a cane.  EXTEMITIES: no deformity.  no ulcer on the feet.  feet are of normal color and temp.  2+ bilat leg edema.  PULSES: dorsalis pedis intact bilat.  no carotid bruit NEURO:  cn 2-12 grossly intact.   readily moves all 4's.  sensation is intact to touch on the feet, but decreased from  normal.  SKIN:  Normal texture and temperature.  No rash or suspicious lesion is visible.  NODES:  None palpable at the neck.  PSYCH: alert, well-oriented.  Does not appear anxious nor depressed.   I personally reviewed electrocardiogram tracing (02/15/16):  Indication: preop.  Impression: 1st deg AVB.  No hypertrophy.  NS-STWA Compared to 05/23/14: lat inverted T-waves are  new.    A1c=7.0%     Assessment & Plan:  Insulin-requiring type 2 DM, with renal insuff: The pattern of his cbg's indicates he needs some adjustment in his therapy.  Abnormal ecg, worse.  He needs to hold off on surgery until this is addresed.   Patient is advised the following: Patient Instructions  Please change the insulin to the numbers listed below.   Please cancel your heart surgery, as it is not safe to undergo now.   Please see a heart specialist.  you will receive a phone call, about a day and time for an appointment check your blood sugar 4 times a day; before the 3 meals, and at bedtime.  also check if you have symptoms of your blood sugar being too high or too low.  please keep a record of the readings and bring it to your next appointment here (or you can bring the meter itself).  You can write it on any piece of paper.  please call us sooner if your blood sugar goes below 70, or if you have a lot of readings over 200.   Please come back for a follow-up appointment in 4 months.

## 2016-02-29 ENCOUNTER — Ambulatory Visit: Admission: RE | Admit: 2016-02-29 | Payer: Medicare Other | Source: Ambulatory Visit | Admitting: Otolaryngology

## 2016-02-29 ENCOUNTER — Encounter: Admission: RE | Payer: Self-pay | Source: Ambulatory Visit

## 2016-02-29 SURGERY — PARATHYROIDECTOMY
Anesthesia: Choice | Laterality: Right

## 2016-03-03 ENCOUNTER — Telehealth: Payer: Self-pay | Admitting: *Deleted

## 2016-03-03 DIAGNOSIS — R946 Abnormal results of thyroid function studies: Secondary | ICD-10-CM

## 2016-03-03 DIAGNOSIS — R948 Abnormal results of function studies of other organs and systems: Principal | ICD-10-CM

## 2016-03-03 NOTE — Telephone Encounter (Signed)
Called and spoke to wife because pt was alseep and told her that the case was discussed about his abnormal parathyroid scan and also had found out that he needs clearance cardiac standpoint before he can have surgery and his appt with Dr. Rockey Situ is sch 2/27.  The discussion of the case was speaking about paraythroid scan with results of: Focal abnormal sestamibi localization suspicious for a RIGHT inferior parathyroid adenoma.  Nonspecific tracer localization within single normal sized LEFT axillary and RIGHT subpectoral lymph nodes. It was rec: that pt have a PET scan to see if they could get more clarity about the abnormal areas that was on scan.  We would like to get scan, take it back to conference and then see pt.  Wife is agreeable and I can proceed and she will tell pt when he wakes up and if there is anything different she will call me back.

## 2016-03-06 ENCOUNTER — Ambulatory Visit
Admission: RE | Admit: 2016-03-06 | Discharge: 2016-03-06 | Disposition: A | Discharge status: Home / Self Care | Payer: Medicare Other | Source: Ambulatory Visit | Attending: Oncology | Admitting: Oncology

## 2016-03-06 DIAGNOSIS — K8020 Calculus of gallbladder without cholecystitis without obstruction: Secondary | ICD-10-CM | POA: Insufficient documentation

## 2016-03-06 DIAGNOSIS — R948 Abnormal results of function studies of other organs and systems: Secondary | ICD-10-CM | POA: Diagnosis present

## 2016-03-06 DIAGNOSIS — I2510 Atherosclerotic heart disease of native coronary artery without angina pectoris: Secondary | ICD-10-CM | POA: Diagnosis not present

## 2016-03-06 DIAGNOSIS — E213 Hyperparathyroidism, unspecified: Secondary | ICD-10-CM | POA: Diagnosis not present

## 2016-03-06 DIAGNOSIS — I700 Atherosclerosis of aorta: Secondary | ICD-10-CM | POA: Diagnosis not present

## 2016-03-06 DIAGNOSIS — K5730 Diverticulosis of large intestine without perforation or abscess without bleeding: Secondary | ICD-10-CM | POA: Diagnosis not present

## 2016-03-06 DIAGNOSIS — N200 Calculus of kidney: Secondary | ICD-10-CM | POA: Insufficient documentation

## 2016-03-06 DIAGNOSIS — R935 Abnormal findings on diagnostic imaging of other abdominal regions, including retroperitoneum: Secondary | ICD-10-CM | POA: Insufficient documentation

## 2016-03-06 DIAGNOSIS — R946 Abnormal results of thyroid function studies: Secondary | ICD-10-CM

## 2016-03-06 DIAGNOSIS — N400 Enlarged prostate without lower urinary tract symptoms: Secondary | ICD-10-CM | POA: Diagnosis not present

## 2016-03-06 LAB — GLUCOSE, CAPILLARY: GLUCOSE-CAPILLARY: 83 mg/dL (ref 65–99)

## 2016-03-06 MED ORDER — FLUDEOXYGLUCOSE F - 18 (FDG) INJECTION
12.0000 | Freq: Once | INTRAVENOUS | Status: AC | PRN
  Administered 2016-03-06: 12.22 via INTRAVENOUS

## 2016-03-07 ENCOUNTER — Inpatient Hospital Stay: Payer: Medicare Other

## 2016-03-08 ENCOUNTER — Inpatient Hospital Stay (HOSPITAL_BASED_OUTPATIENT_CLINIC_OR_DEPARTMENT_OTHER): Payer: Medicare Other | Admitting: Oncology

## 2016-03-08 ENCOUNTER — Other Ambulatory Visit: Payer: Medicare Other

## 2016-03-08 VITALS — BP 129/47 | HR 61 | Temp 98.2°F | Resp 18

## 2016-03-08 DIAGNOSIS — R948 Abnormal results of function studies of other organs and systems: Secondary | ICD-10-CM

## 2016-03-08 DIAGNOSIS — K5730 Diverticulosis of large intestine without perforation or abscess without bleeding: Secondary | ICD-10-CM | POA: Diagnosis not present

## 2016-03-08 DIAGNOSIS — E213 Hyperparathyroidism, unspecified: Secondary | ICD-10-CM | POA: Diagnosis not present

## 2016-03-08 DIAGNOSIS — I129 Hypertensive chronic kidney disease with stage 1 through stage 4 chronic kidney disease, or unspecified chronic kidney disease: Secondary | ICD-10-CM | POA: Diagnosis not present

## 2016-03-08 DIAGNOSIS — R234 Changes in skin texture: Secondary | ICD-10-CM | POA: Diagnosis not present

## 2016-03-08 DIAGNOSIS — N400 Enlarged prostate without lower urinary tract symptoms: Secondary | ICD-10-CM | POA: Diagnosis not present

## 2016-03-08 DIAGNOSIS — R946 Abnormal results of thyroid function studies: Secondary | ICD-10-CM

## 2016-03-08 NOTE — Progress Notes (Signed)
Hematology/Oncology Consult note [facility]  [phone] Fax:([phone]  Patient Care Team: Renato Shin, MD as PCP - General Suella Broad, MD (Physical Medicine and Rehabilitation) Earnie Larsson, MD as Attending Physician (Neurosurgery) Rigoberto Noel, MD as Consulting Physician (Pulmonary Disease) Estill Cotta, MD (Ophthalmology)   Name of the patient: Donald [name]  [id number]  Sep 17, 1945   Date of visit: 03/08/16  Diagnosis- abnormal sestamibi scan uptake  Chief complaint/ Reason for visit- discuss results of pet ct  Heme/Onc history: patient is a 71 year old male in was evaluated by nephrology for chronic kidney disease and was found to have hypercalcemia. Patient has been seeing Dr. Pryor Ochoa for his sinus issues and hearing loss for a while. There was a concern for a cystic neck mass which was aspirated and according to patient and the fluid was negative for malignancy. Ultrasound of the thyroid showed 2 cystic nodules in the right thyroid lobe. These measured 1.2 cm but did not meet criteria for biopsy. CT soft tissue of the neck did not reveal any abnormality. Patient underwent parathyroid spect scan which showed a focal abnormal sestamibi location suspicious for right inferior parathyroid adenoma. Nonspecific tracer localization within single normal-sized left axillary and right subpectoral lymph node. Patient is scheduled for an elective parathyroidectomy on 02/29/2016. He has been referred to [name] for uptake noted in his lymph nodes  Patient reports chronic back pain secondary to disc issues. Denies any unintentional weight loss, loss of appetite, drenching night sweats. Recent CBC from 02/15/2016 was within normal limits. Denies any family history of thyroid, parathyroid, adrenal gland issues. Denies any episodes of headache, palpitations or uncontrolled blood pressures suggestive of pheochromocytoma.  Patient's parathyroid scan was  reviewed at tumor board and given the nonspecific uptake in his lymph nodes PET/CT scan was recommended. PET CT scan on 03/06/2016 showed no suspicious hypermetabolic as in. Specifically no hypermetabolic tumor in the right subpectoral or left axillary lymph nodes. Generalized hypermetabolic throughout the areas of skin thickening and subcutaneous fat stranding symmetrically involving the ventral abdominal wall pannus consistent with cellulitis. Patient also noted to have a staghorn left renal calculus  Interval history- patient is awaiting cardiology clearance to go through this parathyroid surgery.    Review of systems- Review of Systems  Constitutional: Negative for chills, fever, malaise/fatigue and weight loss.  HENT: Negative for congestion, ear discharge and nosebleeds.   Eyes: Negative for blurred vision.  Respiratory: Negative for cough, hemoptysis, sputum production, shortness of breath and wheezing.   Cardiovascular: Negative for chest pain, palpitations, orthopnea and claudication.  Gastrointestinal: Negative for abdominal pain, blood in stool, constipation, diarrhea, heartburn, melena, nausea and vomiting.  Genitourinary: Negative for dysuria, flank pain, frequency, hematuria and urgency.  Musculoskeletal: Positive for back pain. Negative for joint pain and myalgias.  Skin: Negative for rash.  Neurological: Negative for dizziness, tingling, focal weakness, seizures, weakness and headaches.  Endo/Heme/Allergies: Does not bruise/bleed easily.  Psychiatric/Behavioral: Negative for depression and suicidal ideas. The patient does not have insomnia.        Allergies  Allergen Reactions  . Codeine     REACTION: chest pain  . Pioglitazone     REACTION to Actos: swelling in ankles     Past Medical History:  Diagnosis Date  . ANEMIA-IRON DEFICIENCY 07/26/2006  . Anxiety   . ASTHMA 07/26/2006  . Asthma   . Back pain, chronic   . Bell's palsy   . Chronic kidney disease     stage  3 renal failure  . COLONIC POLYPS, HX OF 07/26/2006  . D V T 12/03/2007  . DEPRESSION 03/14/2009  . DIABETES MELLITUS, TYPE II 07/26/2006  . [place] DISEASE, LUMBAR 10/05/2007  . DYSLIPIDEMIA 04/13/2009  . Edema    ankle  . GERD 07/26/2006  . Gout   . Heart murmur   . History of kidney stones   . HYPERTENSION 07/26/2006  . INSOMNIA 08/21/2007  . Neuropathy ([place])   . OBSTRUCTIVE SLEEP APNEA 12/03/2007   Use C-PAP  . PERIPHERAL NEUROPATHY 07/26/2006  . Pernicious anemia 11/20/2006  . PULMONARY EMBOLISM 10/05/2007  . Renal insufficiency   . Shortness of breath dyspnea      Past Surgical History:  Procedure Laterality Date  . APPENDECTOMY  1968  . [place], 06/14/2008   Dr. Trenton Gammon at [facility] (06/10)  . CARDIAC CATHETERIZATION    . CARPAL TUNNEL RELEASE Right   . CATARACT EXTRACTION W/PHACO Left 05/30/2014   Procedure: CATARACT EXTRACTION PHACO AND INTRAOCULAR LENS PLACEMENT (IOC);  Surgeon: Estill Cotta, MD;  Location: [facility] ORS;  Service: Ophthalmology;  Laterality: Left;  [name] 01:20 AP% 23.7 CDE 31.86  . EYE SURGERY Left    Catarct Extraction with IOL  . LITHOTRIPSY     X 2  . SHOULDER ARTHROSCOPY W/ ROTATOR CUFF REPAIR Right   . TONSILLECTOMY      Social History   Social History  . Marital status: Married    Spouse name: N/A  . Number of children: N/A  . Years of education: N/A   Occupational History  . Disabled Disability   Social History Main Topics  . Smoking status: Never Smoker  . Smokeless tobacco: Never Used  . Alcohol use No  . Drug use: No  . Sexual activity: Not on file   Other Topics Concern  . 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   Activity is severely limited by medical problems   Diet is "good".   Never a smoker   Alcohol: none          Family History  Problem Relation Age of Onset  . Cancer Mother     Breast Cancer  . Cancer Sister      Breast Cancer  . Diabetes Father   . Heart disease Father   . Diabetes Paternal Grandfather      Current Outpatient Prescriptions:  .  allopurinol (ZYLOPRIM) 300 MG tablet, TAKE ONE TABLET BY MOUTH EVERY DAY, Disp: 30 tablet, Rfl: 2 .  ALPRAZolam (XANAX) 1 MG tablet, TAKE ONE TABLET AT BEDTIME AS NEEDED FORSLEEP (Patient taking differently: Take 0.5 mg by mouth at bedtime as needed. ), Disp: 30 tablet, Rfl: 3 .  amLODipine (NORVASC) 5 MG tablet, TAKE ONE TABLET AT BEDTIME, Disp: 30 tablet, Rfl: 11 .  aspirin 81 MG chewable tablet, Chew 81 mg by mouth daily., Disp: , Rfl:  .  colchicine (COLCRYS) 0.6 MG tablet, 1 tab every hour until symptoms are better, max of 6 per day.  Stop if diarrhea. (Patient not taking: Reported on 02/19/2016), Disp: 30 tablet, Rfl: 4 .  cyanocobalamin (,VITAMIN B-12,) 1000 MCG/ML injection, Inject 1,000 mcg into the muscle See admin instructions. 1 injection every 3 months , Disp: , Rfl:  .  furosemide (LASIX) 80 MG tablet, TAKE ONE TABLET BY MOUTH EVERY DAY, Disp: 90 tablet, Rfl: 3 .  gabapentin (NEURONTIN) 600 MG tablet, TAKE  ONE TABLET 3 TIMES DAILY (Patient taking differently: TAKE ONE TABLET 2 TIMES DAILY), Disp: 90 tablet, Rfl: 11 .  GE100 BLOOD GLUCOSE TEST test strip, TEST 3-4 TIMES DAILY, Disp: 120 each, Rfl: 11 .  HYDROcodone-acetaminophen (NORCO) 10-325 MG tablet, Take 1 tablet by mouth every 4 (four) hours as needed for moderate pain., Disp: , Rfl:  .  insulin lispro (HUMALOG) 100 UNIT/ML injection, 200 units with breakfast 180 units with lunch and 260 units with the supper, Disp: 17 vial, Rfl: 11 .  insulin NPH Human (HUMULIN N) 100 UNIT/ML injection, Inject 2.8 mLs (280 Units total) into the skin at bedtime., Disp: 10 vial, Rfl: 11 .  losartan (COZAAR) 100 MG tablet, TAKE ONE TABLET BY MOUTH AT BEDTIME, Disp: 90 tablet, Rfl: 3 .  lovastatin (MEVACOR) 40 MG tablet, TAKE TWO TABLETS AT BEDTIME, Disp: 60 tablet, Rfl: 11 .  Naphazoline-Pheniramine (OPCON-A OP),  Place 1 drop into both eyes 2 (two) times daily as needed., Disp: , Rfl:  .  Sennosides-Docusate Sodium (STOOL SOFTENER & LAXATIVE PO), Take 2 tablets by mouth at bedtime., Disp: , Rfl:  .  sildenafil (REVATIO) 20 MG tablet, 1-5 pills, as needed (Patient not taking: Reported on 02/19/2016), Disp: 90 tablet, Rfl: 11  Physical exam: There were no vitals filed for this visit. Physical Exam  Constitutional: He is oriented to person, place, and time.   Moderately obese appears in no acute distress  HENT:  Head: Normocephalic and atraumatic.  Eyes: EOM are normal. Pupils are equal, round, and reactive to light.  Neck: Normal range of motion.  Cardiovascular: Normal rate, regular rhythm and normal heart sounds.   Pulmonary/Chest: Effort normal and breath sounds normal.  Abdominal: Soft. Bowel sounds are normal.  Neurological: He is alert and oriented to person, place, and time.  Skin: Skin is warm and dry.     CMP Latest Ref Rng & Units 02/15/2016  Glucose 65 - 99 mg/dL 119(H)  BUN 6 - 20 mg/dL 26(H)  Creatinine 0.61 - 1.24 mg/dL 1.37(H)  Sodium 135 - 145 mmol/L 143  Potassium 3.5 - 5.1 mmol/L 4.2  Chloride 101 - 111 mmol/L 104  CO2 22 - 32 mmol/L 32  Calcium 8.9 - 10.3 mg/dL 10.8(H)  Total Protein 6.0 - 8.3 g/dL -  Total Bilirubin 0.2 - 1.2 mg/dL -  Alkaline Phos 39 - 117 U/L -  AST 0 - 37 U/L -  ALT 0 - 53 U/L -   CBC Latest Ref Rng & Units 02/15/2016  WBC 3.8 - 10.6 K/uL 7.3  Hemoglobin 13.0 - 18.0 g/dL 13.8  Hematocrit 40.0 - 52.0 % 41.3  Platelets 150 - 440 K/uL 171    No images are attached to the encounter.  Nm Pet Image Initial (pi) Skull Base To Thigh  Result Date: 03/06/2016 CLINICAL DATA:  Initial treatment strategy for 71 year old male with hyperparathyroidism and focal abnormal sestamibi localization suspicious for a right inferior parathyroid adenoma on recent sestamibi SPECT-CT, which also demonstrated nonspecific tracer localization within left axillary and right  subpectoral lymph nodes. EXAM: NUCLEAR MEDICINE PET SKULL BASE TO THIGH TECHNIQUE: 12.2 mCi F-18 FDG was injected intravenously. Full-ring PET imaging was performed from the skull base to thigh after the radiotracer. CT data was obtained and used for attenuation correction and anatomic localization. FASTING BLOOD GLUCOSE:  Value: 83 mg/dl COMPARISON:  02/05/2016 sestamibi SPECT-CT. 11/09/2010 CT angiogram of the chest, abdomen and pelvis. FINDINGS: NECK No hypermetabolic lymph nodes in the neck. No hypermetabolic thyroid  nodules. No parathyroidal hypermetabolism. CHEST No hypermetabolic axillary, mediastinal or hilar nodes. Specifically, no hypermetabolism in the right subpectoral or left axillary nodes at the areas of abnormal sestamibi tracer uptake on the recent sestamibi SPECT CT study. Mild cardiomegaly. Left main, left anterior descending, left circumflex and right coronary atherosclerosis. Atherosclerotic nonaneurysmal thoracic aorta. No pleural effusions. No pneumothorax. No acute consolidative airspace disease, lung masses or significant pulmonary nodules. ABDOMEN/PELVIS No abnormal hypermetabolic activity within the liver, pancreas, adrenal glands, or spleen. No hypermetabolic lymph nodes in the abdomen or pelvis. Cholelithiasis. Staghorn calculus in the lower left renal collecting system measuring up to the 29 x 25 mm. Mild caliectasis in the lower left renal collecting system. Atherosclerotic nonaneurysmal abdominal aorta. Moderate sigmoid diverticulosis. Mildly enlarged prostate. Generalized hypermetabolism throughout the areas of skin thickening and superficial subcutaneous fat stranding in the bilateral ventral abdominal wall pannus. No discrete mass or fluid collection. SKELETON No focal hypermetabolic activity to suggest skeletal metastasis. Status post bilateral posterior spinal fusion in the lower lumbar spine. Soft tissue anchors in the left humeral head. IMPRESSION: 1. No suspicious  hypermetabolism. Specifically, no hypermetabolism in the right subpectoral or left axillary lymph nodes at the areas of abnormal sestamibi tracer uptake on the recent sestamibi SPECT-CT study. 2. Generalized hypermetabolism throughout the areas of skin thickening and superficial subcutaneous fat stranding symmetrically involving the bilateral ventral abdominal wall pannus, suggesting cellulitis. Recommend correlation with clinical exam. 3. Additional findings include aortic atherosclerosis, mild cardiomegaly, left main and 3 vessel coronary atherosclerosis, cholelithiasis, staghorn left renal calculus, moderate sigmoid diverticulosis and mildly enlarged prostate. Electronically Signed   By: Ilona Sorrel M.D.   On: 03/06/2016 13:00     Assessment and plan- Patient is a 71 y.o. male who was referred to [name] for abnormal parathyroid scan   1. I discussed the results of PET/CT with the patient which does not reveal any hypermetabolism in these lymph nodes. At this time no further workup is recommended from that standpoint.  2. I also recommended that patient should speak to his nephrologist regarding his staghorn calculus noted on CT scan which she states is a chronic finding  Patient can continue to follow-up with his Pcp and Dr. Pryor Ochoa. No active heme/onc follow up necessary at this time. He can be referred to [name] in the future if questions or concerns arise Visit Diagnosis 1. Abnormal radionuclide scan of parathyroid gland      Dr. Randa Evens, MD, MPH [facility] at [facility] Pager- [phone] 03/08/2016 4:10 PM

## 2016-03-08 NOTE — Progress Notes (Signed)
Here for ct results.

## 2016-03-12 ENCOUNTER — Encounter: Payer: Self-pay | Admitting: Cardiovascular Disease

## 2016-03-12 ENCOUNTER — Ambulatory Visit (INDEPENDENT_AMBULATORY_CARE_PROVIDER_SITE_OTHER): Payer: Medicare Other | Admitting: Cardiovascular Disease

## 2016-03-12 VITALS — BP 130/60 | HR 50 | Ht 66.0 in | Wt 281.5 lb

## 2016-03-12 DIAGNOSIS — I25119 Atherosclerotic heart disease of native coronary artery with unspecified angina pectoris: Secondary | ICD-10-CM | POA: Diagnosis not present

## 2016-03-12 DIAGNOSIS — IMO0002 Reserved for concepts with insufficient information to code with codable children: Secondary | ICD-10-CM | POA: Insufficient documentation

## 2016-03-12 DIAGNOSIS — E1165 Type 2 diabetes mellitus with hyperglycemia: Secondary | ICD-10-CM

## 2016-03-12 DIAGNOSIS — I10 Essential (primary) hypertension: Secondary | ICD-10-CM

## 2016-03-12 DIAGNOSIS — E119 Type 2 diabetes mellitus without complications: Secondary | ICD-10-CM | POA: Diagnosis not present

## 2016-03-12 DIAGNOSIS — R9431 Abnormal electrocardiogram [ECG] [EKG]: Secondary | ICD-10-CM

## 2016-03-12 DIAGNOSIS — Z01810 Encounter for preprocedural cardiovascular examination: Secondary | ICD-10-CM

## 2016-03-12 DIAGNOSIS — I700 Atherosclerosis of aorta: Secondary | ICD-10-CM | POA: Diagnosis not present

## 2016-03-12 DIAGNOSIS — I209 Angina pectoris, unspecified: Secondary | ICD-10-CM | POA: Diagnosis not present

## 2016-03-12 DIAGNOSIS — E1142 Type 2 diabetes mellitus with diabetic polyneuropathy: Secondary | ICD-10-CM | POA: Insufficient documentation

## 2016-03-12 DIAGNOSIS — E1140 Type 2 diabetes mellitus with diabetic neuropathy, unspecified: Secondary | ICD-10-CM | POA: Insufficient documentation

## 2016-03-12 DIAGNOSIS — E785 Hyperlipidemia, unspecified: Secondary | ICD-10-CM | POA: Diagnosis not present

## 2016-03-12 NOTE — Progress Notes (Signed)
Cardiology Office Note  Date:  03/12/2016   ID:  Donald [name], DOB [birth year], MRN [id number]  PCP:  Renato Shin, MD   Chief Complaint  Patient presents with  . other    Pt. needs a cardiac clearance for a PARATHYROIDECTOMY due to an abnormal EKG at pre op. Pt. c/o shortness of breath with over exertion. Former pt. of Dr. Lia Foyer with [facility]. Meds reviewed by the pt. verbally.     HPI:  [name] is a 71 year old male with history of Long-standing diabetes, hyperlipidemia, coronary artery disease, PAD,  chronic kidney disease, hypercalcemia,  OSA on CPAP, who presents by referral from Dr. Renato Shin for Cardiovascular workup for parathyroidectomy, abnormal EKG.  He reports that he recently met with anesthesia as part of the preop workup, was felt to have abnormal EKG. T-wave abnormality noted in V5 and V6, 1 and aVL  He reports that he feels well, is sedentary, unable to exercise secondary to chronic leg pain, knee discomfort.  He does report some shortness of breath when he carries his grandchild in from the car into the house. He is approximately 30 pounds. Recovers relatively quickly after he sits down. Denies having significant chest pain.   chronic back pain secondary to disc issues.  Other major complaint is poor sleep, chronic fatigue Unable to follow sleep with half dose of his Ativan. Full dose Ativan made him sleepy in the morning. Compliant with his CPAP  Recent PET/CT imaging reviewed with him This shows mild to moderate calcified plaque in the carotid arteries right greater than left Mild to moderate plaque in the aortic arch Diffuse three-vessel coronary calcifications He does have mild to moderate plaquing in the branches off the mid to distal descending aorta  staghorn left renal calculus  Recent parathyroid spectscan  showed a focal abnormal sestamibi location suspicious for right inferior parathyroid adenoma. Nonspecific tracer localization within  single normal-sized left axillary and right subpectoral lymph node.    scheduled for an elective parathyroidectomy on 02/29/2016.   Prior cardiac catheterization 2012 with 40% coronary disease noted  EKG reviewed by myself on today's visit showing normal sinus rhythm with T-wave abnormality V6, 1 and aVL. No significant change compared to prior EKGs.  Some EKGs with less notable T-wave abnormality in V6, likely lead placement   PMH:   has a past medical history of ANEMIA-IRON DEFICIENCY (07/26/2006); Anxiety; ASTHMA (07/26/2006); Asthma; Back pain, chronic; Bell's palsy; Chronic kidney disease; COLONIC POLYPS, HX OF (07/26/2006); D V T (12/03/2007); DEPRESSION (03/14/2009); DIABETES MELLITUS, TYPE II (07/26/2006); DISC DISEASE, LUMBAR (10/05/2007); DYSLIPIDEMIA (04/13/2009); Edema; GERD (07/26/2006); Gout; Heart murmur; History of kidney stones; HYPERTENSION (07/26/2006); INSOMNIA (08/21/2007); Neuropathy ([place]); OBSTRUCTIVE SLEEP APNEA (12/03/2007); PERIPHERAL NEUROPATHY (07/26/2006); Pernicious anemia (11/20/2006); PULMONARY EMBOLISM (10/05/2007); Renal insufficiency; and Shortness of breath dyspnea.  PSH:    Past Surgical History:  Procedure Laterality Date  . APPENDECTOMY  1968  . Donald [name], 06/14/2008   Dr. Trenton Gammon at [facility] (06/10)  . CARDIAC CATHETERIZATION    . CARPAL TUNNEL RELEASE Right   . CATARACT EXTRACTION W/PHACO Left 05/30/2014   Procedure: CATARACT EXTRACTION PHACO AND INTRAOCULAR LENS PLACEMENT (IOC);  Surgeon: Estill Cotta, MD;  Location: [facility] ORS;  Service: Ophthalmology;  Laterality: Left;  [name] 01:20 AP% 23.7 CDE 31.86  . EYE SURGERY Left    Catarct Extraction with IOL  . LITHOTRIPSY     X 2  . SHOULDER ARTHROSCOPY W/ ROTATOR CUFF REPAIR Right   . TONSILLECTOMY  Current Outpatient Prescriptions  Medication Sig Dispense Refill  . allopurinol (ZYLOPRIM) 300 MG tablet TAKE ONE TABLET BY MOUTH EVERY DAY 30 tablet 2  . ALPRAZolam (XANAX) 1 MG tablet TAKE ONE  TABLET AT BEDTIME AS NEEDED FORSLEEP 30 tablet 3  . amLODipine (NORVASC) 5 MG tablet TAKE ONE TABLET AT BEDTIME 30 tablet 11  . aspirin 81 MG chewable tablet Chew 81 mg by mouth daily.    . colchicine (COLCRYS) 0.6 MG tablet 1 tab every hour until symptoms are better, max of 6 per day.  Stop if diarrhea. 30 tablet 4  . cyanocobalamin (,VITAMIN B-12,) 1000 MCG/ML injection Inject 1,000 mcg into the muscle See admin instructions. 1 injection every 3 months     . furosemide (LASIX) 80 MG tablet TAKE ONE TABLET BY MOUTH EVERY DAY 90 tablet 3  . gabapentin (NEURONTIN) 600 MG tablet Take 600 mg by mouth 2 (two) times daily.    . GE100 BLOOD GLUCOSE TEST test strip TEST 3-4 TIMES DAILY 120 each 11  . HYDROcodone-acetaminophen (NORCO) 10-325 MG tablet Take 1 tablet by mouth every 4 (four) hours as needed for moderate pain.    Marland Kitchen insulin lispro (HUMALOG) 100 UNIT/ML injection 200 units with breakfast 180 units with lunch and 260 units with the supper 17 vial 11  . insulin NPH Human (HUMULIN N) 100 UNIT/ML injection Inject 2.8 mLs (280 Units total) into the skin at bedtime. 10 vial 11  . losartan (COZAAR) 100 MG tablet TAKE ONE TABLET BY MOUTH AT BEDTIME 90 tablet 3  . lovastatin (MEVACOR) 40 MG tablet TAKE TWO TABLETS AT BEDTIME 60 tablet 11  . Naphazoline-Pheniramine (OPCON-A OP) Place 1 drop into both eyes 2 (two) times daily as needed.    [name] Sodium (STOOL SOFTENER & LAXATIVE PO) Take 2 tablets by mouth at bedtime.     No current facility-administered medications for this visit.      Allergies:   Codeine and Pioglitazone   Social History:  The patient  reports that he has never smoked. He has never used smokeless tobacco. He reports that he does not drink alcohol or use drugs.   Family History:   family history includes Cancer in his mother and sister; Diabetes in his father and paternal grandfather; Heart disease in his father.    Review of Systems: Review of Systems   Constitutional: Positive for malaise/fatigue.  Respiratory: Negative.   Cardiovascular: Negative.   Gastrointestinal: Negative.   Musculoskeletal: Positive for joint pain.  Neurological: Positive for weakness.  Psychiatric/Behavioral: Negative.   All other systems reviewed and are negative.    PHYSICAL EXAM: VS:  BP 130/60 (BP Location: Right Arm, Patient Position: Sitting, Cuff Size: Normal)   Pulse (!) 50   Ht _0  (1.676 m)   Wt 281 lb 8 oz (127.7 kg)   BMI 45.44 kg/m  , BMI Body mass index is 45.44 kg/m. GEN: Well nourished, well developed, in no acute distress , obese HEENT: normal  Neck: no JVD, carotid bruits, or masses Cardiac: RRR; no murmurs, rubs, or gallops,no edema  Respiratory:  clear to auscultation bilaterally, normal work of breathing GI: soft, nontender, nondistended, + BS MS: no deformity or atrophy  Skin: warm and dry, no rash Neuro:  Strength and sensation are intact Psych: euthymic mood, full affect    Recent Labs: 02/15/2016: BUN 26; Creatinine, Ser 1.37; Hemoglobin 13.8; Platelets 171; Potassium 4.2; Sodium 143    Lipid Panel Lab Results  Component Value Date  CHOL 103 01/27/2014   HDL 23.90 (L) 01/27/2014   LDLCALC 56 01/27/2014   TRIG 115.0 01/27/2014      Wt Readings from Last 3 Encounters:  03/12/16 281 lb 8 oz (127.7 kg)  02/28/16 279 lb (126.6 kg)  02/19/16 280 lb 10.3 oz (127.3 kg)       ASSESSMENT AND PLAN:  Essential hypertension - Plan: EKG 12-Lead, NM Myocar Multi W/Spect W/Wall Motion / EF Blood pressure is well controlled on today's visit. No changes made to the medications.  Dyslipidemia - Plan: EKG 12-Lead, NM Myocar Multi W/Spect W/Wall Motion / EF Cholesterol is at goal on the current lipid regimen. No changes to the medications were made.]  ECG abnormal - Plan: EKG 12-Lead, NM Myocar Multi W/Spect W/Wall Motion / EF Nonspecific T wave abnormality in the setting of three-vessel coronary disease, no symptoms of  angina but some mild shortness of breath on exertion, very sedentary lifestyle, unable to treadmill. Long discussion with him concerning the above. Given the extent of his coronary calcification, we have recommended a pharmacologic Myoview.  Coronary artery disease involving native coronary artery of native heart with angina pectoris (HCC) Significant three-vessel coronary calcification seen on recent CT scan Long discussion with him about various treatment options At his request, stress Myoview has been ordered. He is unable to treadmill. EKG abnormality, shortness of breath on exertion, unable to exclude stable angina. Prior cardiac catheterization in 2012 with known coronary disease mild in the left main, 40% in other vessels  Aortic atherosclerosis (HCC) Seen in the aortic arch, also branches of the descending aorta Stressed importance of aggressive diabetes control  Morbid obesity ([place]) Unable to exercise secondary to orthopedic issues, recommended low carbohydrate diet.  Type 2 diabetes mellitus without complication, without long-term current use of insulin (HCC) Hemoglobin A1c typically runs 7 or higher Stressed importance of low carbohydrate diet, weight loss in an effort to achieve hemoglobin A1c of 6  Preop cardiovascular evaluation Likely acceptable risk for surgery He prefers to have pharmacologic Myoview prior to the surgery for risk stratification. If low risk or moderate risk perfusion scan, would proceed with surgery.   Long discussion with patient and his wife concerning above, all questions answered  Total encounter time more than 60 minutes  Greater than 50% was spent in counseling and coordination of care with the patient   Disposition:   F/U  6 months   Orders Placed This Encounter  Procedures  . NM Myocar Multi W/Spect W/Wall Motion / EF  . EKG 12-Lead     Signed, Esmond Plants, M.D., Ph.D. 03/12/2016  [place],  [place]

## 2016-03-12 NOTE — Patient Instructions (Addendum)
Medication Instructions:   No medication changes made  Labwork:  No new labs needed  Testing/Procedures:  We will order a lexiscan myoview for CAD, abn EKG unable to treadmill  [place]  Your caregiver has ordered a Stress Test with nuclear imaging. The purpose of this test is to evaluate the blood supply to your heart muscle. This procedure is referred to as a "Non-Invasive Stress Test." This is because other than having an IV started in your vein, nothing is inserted or "invades" your body. Cardiac stress tests are done to find areas of poor blood flow to the heart by determining the extent of coronary artery disease (CAD). Some patients exercise on a treadmill, which naturally increases the blood flow to your heart, while others who are  unable to walk on a treadmill due to physical limitations have a pharmacologic/chemical stress agent called Lexiscan . This medicine will mimic walking on a treadmill by temporarily increasing your coronary blood flow.   Please note: these test may take anywhere between 2-4 hours to complete  PLEASE REPORT TO [facility] AT THE FIRST DESK WILL DIRECT YOU WHERE TO GO  Date of Procedure:______Wednesday, Feb 28_______  Arrival Time for Procedure:____9:45 am________  Instructions regarding medication:   __X__ : Hold diabetes medication morning of procedure  __X__:  Hold AMLODIPINE the morning of procedure   How to prepare for your Myoview test:  1. Do not eat or drink after midnight 2. No caffeine for 24 hours prior to test 3. No smoking 24 hours prior to test. 4. Your medication may be taken with water.  If your doctor stopped a medication because of this test, do not take that medication. 5. Ladies, please do not wear dresses.  Skirts or pants are appropriate. Please wear a short sleeve shirt. 6. No perfume, cologne or lotion.   I recommend watching educational videos on topics of interest to you at:    [email or web address].com  Enter code: HEARTCARE    Follow-Up: It was a pleasure seeing you in the office today. Please call us if you have new issues that need to be addressed before your next appt.  [phone]  Your physician wants you to follow-up in: 6 months.  You will receive a reminder letter in the mail two months in advance. If you don't receive a letter, please call our office to schedule the follow-up appointment.  If you need a refill on your cardiac medications before your next appointment, please call your pharmacy.       Pharmacologic Stress Electrocardiogram Introduction A pharmacologic stress electrocardiogram is a heart (cardiac) test that uses nuclear imaging to evaluate the blood supply to your heart. This test may also be called a pharmacologic stress electrocardiography. Pharmacologic means that a medicine is used to increase your heart rate and blood pressure. This stress test is done to find areas of poor blood flow to the heart by determining the extent of coronary artery disease (CAD). Some people exercise on a treadmill, which naturally increases the blood flow to the heart. For those people unable to exercise on a treadmill, a medicine is used. This medicine stimulates your heart and will cause your heart to beat harder and more quickly, as if you were exercising. Pharmacologic stress tests can help determine:  The adequacy of blood flow to your heart during increased levels of activity in order to clear you for discharge home.  The extent of coronary artery blockage caused by CAD.  Your prognosis if you have suffered a heart attack.  The effectiveness of cardiac procedures done, such as an angioplasty, which can increase the circulation in your coronary arteries.  Causes of chest pain or pressure. LET [facility] CARE PROVIDER KNOW ABOUT:  Any allergies you have.  All medicines you are taking, including vitamins, herbs, eye drops, creams, and  over-the-counter medicines.  Previous problems you or members of your family have had with the use of anesthetics.  Any blood disorders you have.  Previous surgeries you have had.  Medical conditions you have.  Possibility of pregnancy, if this applies.  If you are currently breastfeeding. RISKS AND COMPLICATIONS Generally, this is a safe procedure. However, as with any procedure, complications can occur. Possible complications include:  You develop pain or pressure in the following areas:  Chest.  Jaw or neck.  Between your shoulder blades.  Radiating down your left arm.  Headache.  Dizziness or light-headedness.  Shortness of breath.  Increased or irregular heartbeat.  Low blood pressure.  Nausea or vomiting.  Flushing.  Redness going up the arm and slight pain during injection of medicine.  Heart attack (rare). BEFORE THE PROCEDURE  Avoid all forms of caffeine for 24 hours before your test or as directed by your health care provider. This includes coffee, tea (even decaffeinated tea), caffeinated sodas, chocolate, cocoa, and certain pain medicines.  Follow your health care provider's instructions regarding eating and drinking before the test.  Take your medicines as directed at regular times with water unless instructed otherwise. Exceptions may include:  If you have diabetes, ask how you are to take your insulin or pills. It is common to adjust insulin dosing the morning of the test.  If you are taking beta-blocker medicines, it is important to talk to your health care provider about these medicines well before the date of your test. Taking beta-blocker medicines may interfere with the test. In some cases, these medicines need to be changed or stopped 24 hours or more before the test.  If you wear a nitroglycerin patch, it may need to be removed prior to the test. Ask your health care provider if the patch should be removed before the test.  If you use an  inhaler for any breathing condition, bring it with you to the test.  If you are an outpatient, bring a snack so you can eat right after the stress phase of the test.  Do not smoke for 4 hours prior to the test or as directed by your health care provider.  Do not apply lotions, powders, creams, or oils on your chest prior to the test.  Wear comfortable shoes and clothing. Let your health care provider know if you were unable to complete or follow the preparations for your test. PROCEDURE  Multiple patches (electrodes) will be put on your chest. If needed, small areas of your chest may be shaved to get better contact with the electrodes. Once the electrodes are attached to your body, multiple wires will be attached to the electrodes, and your heart rate will be monitored.  An IV access will be started. A nuclear trace (isotope) is given. The isotope may be given intravenously, or it may be swallowed. Nuclear refers to several types of radioactive isotopes, and the nuclear isotope lights up the arteries so that the nuclear images are clear. The isotope is absorbed by your body. This results in low radiation exposure.  A resting nuclear image is taken to show how your  heart functions at rest.  A medicine is given through the IV access.  A second scan is done about 1 hour after the medicine injection and determines how your heart functions under stress.  During this stress phase, you will be connected to an electrocardiogram machine. Your blood pressure and oxygen levels will be monitored. What to expect after the procedure  Your heart rate and blood pressure will be monitored after the test.  You may return to your normal schedule, including diet,activities, and medicines, unless your health care provider tells you otherwise. This information is not intended to replace advice given to you by your health care provider. Make sure you discuss any questions you have with your health care  provider. Document Released: 05/19/2008 Document Revised: 06/08/2015 Document Reviewed: 07/10/2015 Elsevier Interactive Patient Education  2017 [employer].

## 2016-03-13 ENCOUNTER — Ambulatory Visit
Admission: RE | Admit: 2016-03-13 | Discharge: 2016-03-13 | Disposition: A | Discharge status: Home / Self Care | Payer: Medicare Other | Source: Ambulatory Visit | Attending: Cardiovascular Disease | Admitting: Cardiovascular Disease

## 2016-03-13 DIAGNOSIS — R931 Abnormal findings on diagnostic imaging of heart and coronary circulation: Secondary | ICD-10-CM | POA: Insufficient documentation

## 2016-03-13 DIAGNOSIS — E785 Hyperlipidemia, unspecified: Secondary | ICD-10-CM

## 2016-03-13 DIAGNOSIS — I10 Essential (primary) hypertension: Secondary | ICD-10-CM

## 2016-03-13 DIAGNOSIS — R9431 Abnormal electrocardiogram [ECG] [EKG]: Secondary | ICD-10-CM

## 2016-03-13 LAB — NM MYOCAR MULTI W/SPECT W/WALL MOTION / EF
CHL CUP NUCLEAR SDS: 2
CHL CUP NUCLEAR SRS: 6
CHL CUP RESTING HR STRESS: 61 {beats}/min
LV dias vol: 178 mL (ref 62–150)
LVSYSVOL: 76 mL
NUC STRESS TID: 1.15
Peak HR: 75 {beats}/min
Percent HR: 49 %
SSS: 6

## 2016-03-13 MED ORDER — TECHNETIUM TC 99M TETROFOSMIN IV KIT
13.0000 | PACK | Freq: Once | INTRAVENOUS | Status: AC | PRN
  Administered 2016-03-13: 13.53 via INTRAVENOUS

## 2016-03-13 MED ORDER — REGADENOSON 0.4 MG/5ML IV SOLN
0.4000 mg | Freq: Once | INTRAVENOUS | Status: AC
  Administered 2016-03-13: 0.4 mg via INTRAVENOUS

## 2016-03-13 MED ORDER — TECHNETIUM TC 99M TETROFOSMIN IV KIT
29.9700 | PACK | Freq: Once | INTRAVENOUS | Status: AC | PRN
  Administered 2016-03-13: 29.97 via INTRAVENOUS

## 2016-03-14 MED ORDER — INSULIN NPH (HUMAN) (ISOPHANE) 100 UNIT/ML ~~LOC~~ SUSP: SUBCUTANEOUS | 5 refills | Status: DC

## 2016-03-14 NOTE — Telephone Encounter (Signed)
Refill submitted. 

## 2016-03-14 NOTE — Telephone Encounter (Signed)
OK 

## 2016-03-14 NOTE — Telephone Encounter (Signed)
See message,  Alternative is Novolin N. Ok to change?

## 2016-03-14 NOTE — Telephone Encounter (Signed)
Please call the pt back, he has a new insurance plan and the plan will not cover the brand name of the insuliin he is currently using will we give the pharmacy the ok for the night time insulin to be changed  Per the pharmacy they have sent [name] a fax with what will be covered.

## 2016-03-21 ENCOUNTER — Telehealth: Payer: Self-pay | Admitting: *Deleted

## 2016-03-21 ENCOUNTER — Encounter: Payer: Self-pay | Admitting: *Deleted

## 2016-03-21 NOTE — Telephone Encounter (Signed)
-----   Message from Donnita Falls, RN sent at 03/20/2016  4:33 PM EST ----- Hayden Rasmussen from Dr. Darien Ramus office at [facility] called and they need to get a copy of Mr. bellanca PET scan and a letter of clearance for surgery from Dr. Janese Banks   Fax:  [phone] Phone:  [phone]  Thanks  Leana Roe

## 2016-03-21 NOTE — Telephone Encounter (Signed)
Faxed over clearance letter to Dr. Pryor Ochoa for surgery and they equested copy of pet and I sent Dr. Janese Banks note to [facility] office.

## 2016-04-10 ENCOUNTER — Ambulatory Visit: Payer: Medicare Other | Admitting: Endocrinology

## 2016-04-11 ENCOUNTER — Encounter
Admission: RE | Admit: 2016-04-11 | Discharge: 2016-04-11 | Disposition: A | Discharge status: Home / Self Care | Payer: Medicare Other | Source: Ambulatory Visit | Attending: Otolaryngology | Admitting: Otolaryngology

## 2016-04-11 NOTE — Patient Instructions (Signed)
  Your procedure is scheduled on: 04-18-16 (Thursday) Report to Same Day Surgery 2nd floor medical mall [facility] Entrance-take elevator on left to 2nd floor.  Check in with surgery information desk.) To find out your arrival time please call [phone] between 1PM - 3PM on 04-17-16 (Wednesday)  Remember: Instructions that are not followed completely may result in serious medical risk, up to and including death, or upon the discretion of your surgeon and anesthesiologist your surgery may need to be rescheduled.    _x___ 1. Do not eat food or drink liquids after midnight. No gum chewing or  hard candies.     __x__ 2. No Alcohol for 24 hours before or after surgery.   __x__3. No Smoking for 24 prior to surgery.   ____  4. Bring all medications with you on the day of surgery if instructed.    __x__ 5. Notify your doctor if there is any change in your medical condition     (cold, fever, infections).     Do not wear jewelry, make-up, hairpins, clips or nail polish.  Do not wear lotions, powders, or perfumes. You may wear deodorant.  Do not shave 48 hours prior to surgery. Men may shave face and neck.  Do not bring valuables to the hospital.    [facility] is not responsible for any belongings or valuables.               Contacts, dentures or bridgework may not be worn into surgery.  Leave your suitcase in the car. After surgery it may be brought to your room.  For patients admitted to the hospital, discharge time is determined by your treatment team.   Patients discharged the day of surgery will not be allowed to drive home.  You will need someone to drive you home and stay with you the night of your procedure.    Please read over the following fact sheets that you were given:     _x___ Take anti-hypertensive (unless it includes a diuretic), cardiac, seizure, asthma,     anti-reflux and psychiatric medicines. These include:  1. GABAPENTIN (NEURONTIN)  2.    3.  4.  5.  6.  ____Fleets enema or Magnesium Citrate as directed.   ____ Use CHG Soap or sage wipes as directed on instruction sheet   ____ Use inhalers on the day of surgery and bring to hospital day of surgery  ____ Stop Metformin and Janumet 2 days prior to surgery.    _X___ Take 1/2 of usual insulin dose the night before surgery and none on the morning surgery. TAKE HALF OF HUMULIN N THE NIGHT BEFORE  SURGERY AND NO INSULIN THE MORNING OF SURGERY  _x___ Follow recommendations from Cardiologist, Pulmonologist or PCP regarding stopping Aspirin, Coumadin, Pllavix ,Eliquis, Effient, or Pradaxa, and Pletal-PT HAS ALREADY STOPPED ASPIRIN  X____Stop Anti-inflammatories such as Advil, Aleve, Ibuprofen, Motrin, Naproxen, Naprosyn, Goodies powders or aspirin products NOW-OK to take Tylenol   ____ Stop supplements until after surgery.     _X___ Bring C-Pap to the hospital.

## 2016-04-15 ENCOUNTER — Other Ambulatory Visit: Payer: Self-pay | Admitting: Endocrinology

## 2016-04-15 ENCOUNTER — Encounter
Admission: RE | Admit: 2016-04-15 | Discharge: 2016-04-15 | Disposition: A | Discharge status: Home / Self Care | Payer: Medicare Other | Source: Ambulatory Visit | Attending: Otolaryngology | Admitting: Otolaryngology

## 2016-04-15 DIAGNOSIS — Z01812 Encounter for preprocedural laboratory examination: Secondary | ICD-10-CM | POA: Insufficient documentation

## 2016-04-15 LAB — BASIC METABOLIC PANEL
ANION GAP: 7 (ref 5–15)
BUN: 28 mg/dL — ABNORMAL HIGH (ref 6–20)
CO2: 29 mmol/L (ref 22–32)
Calcium: 10.7 mg/dL — ABNORMAL HIGH (ref 8.9–10.3)
Chloride: 106 mmol/L (ref 101–111)
Creatinine, Ser: 1.44 mg/dL — ABNORMAL HIGH (ref 0.61–1.24)
GFR calc Af Amer: 56 mL/min — ABNORMAL LOW (ref >60)
GFR, EST NON AFRICAN AMERICAN: 48 mL/min — AB (ref >60)
Glucose, Bld: 150 mg/dL — ABNORMAL HIGH (ref 65–99)
POTASSIUM: 4.2 mmol/L (ref 3.5–5.1)
SODIUM: 142 mmol/L (ref 135–145)

## 2016-04-17 NOTE — Pre-Procedure Instructions (Signed)
CARDIAC, NEPHROLOGY AND ONCOLOGY/HEMATOLOGY CLEARANCE ON CHART

## 2016-04-18 ENCOUNTER — Encounter
Admission: RE | Disposition: A | Discharge status: Home / Self Care | Payer: Self-pay | Source: Ambulatory Visit | Attending: Otolaryngology

## 2016-04-18 ENCOUNTER — Ambulatory Visit: Payer: Medicare Other | Admitting: Anesthesiology

## 2016-04-18 ENCOUNTER — Encounter: Payer: Self-pay | Admitting: *Deleted

## 2016-04-18 ENCOUNTER — Observation Stay
Admission: RE | Admit: 2016-04-18 | Discharge: 2016-04-19 | Disposition: A | Discharge status: Home / Self Care | Payer: Medicare Other | Source: Ambulatory Visit | Attending: Otolaryngology | Admitting: Otolaryngology

## 2016-04-18 DIAGNOSIS — Z794 Long term (current) use of insulin: Secondary | ICD-10-CM | POA: Insufficient documentation

## 2016-04-18 DIAGNOSIS — I129 Hypertensive chronic kidney disease with stage 1 through stage 4 chronic kidney disease, or unspecified chronic kidney disease: Secondary | ICD-10-CM | POA: Diagnosis not present

## 2016-04-18 DIAGNOSIS — E785 Hyperlipidemia, unspecified: Secondary | ICD-10-CM | POA: Diagnosis not present

## 2016-04-18 DIAGNOSIS — Z9989 Dependence on other enabling machines and devices: Secondary | ICD-10-CM | POA: Diagnosis not present

## 2016-04-18 DIAGNOSIS — E213 Hyperparathyroidism, unspecified: Secondary | ICD-10-CM | POA: Diagnosis present

## 2016-04-18 DIAGNOSIS — E210 Primary hyperparathyroidism: Principal | ICD-10-CM | POA: Insufficient documentation

## 2016-04-18 DIAGNOSIS — M109 Gout, unspecified: Secondary | ICD-10-CM | POA: Insufficient documentation

## 2016-04-18 DIAGNOSIS — D351 Benign neoplasm of parathyroid gland: Secondary | ICD-10-CM | POA: Diagnosis not present

## 2016-04-18 DIAGNOSIS — Z7982 Long term (current) use of aspirin: Secondary | ICD-10-CM | POA: Diagnosis not present

## 2016-04-18 DIAGNOSIS — Z79899 Other long term (current) drug therapy: Secondary | ICD-10-CM | POA: Insufficient documentation

## 2016-04-18 DIAGNOSIS — G4733 Obstructive sleep apnea (adult) (pediatric): Secondary | ICD-10-CM | POA: Diagnosis not present

## 2016-04-18 DIAGNOSIS — N183 Chronic kidney disease, stage 3 (moderate): Secondary | ICD-10-CM | POA: Insufficient documentation

## 2016-04-18 DIAGNOSIS — E892 Postprocedural hypoparathyroidism: Secondary | ICD-10-CM

## 2016-04-18 DIAGNOSIS — Q892 Congenital malformations of other endocrine glands: Secondary | ICD-10-CM | POA: Diagnosis not present

## 2016-04-18 DIAGNOSIS — E1151 Type 2 diabetes mellitus with diabetic peripheral angiopathy without gangrene: Secondary | ICD-10-CM | POA: Diagnosis not present

## 2016-04-18 DIAGNOSIS — G4730 Sleep apnea, unspecified: Secondary | ICD-10-CM | POA: Insufficient documentation

## 2016-04-18 DIAGNOSIS — E119 Type 2 diabetes mellitus without complications: Secondary | ICD-10-CM | POA: Diagnosis not present

## 2016-04-18 DIAGNOSIS — F419 Anxiety disorder, unspecified: Secondary | ICD-10-CM | POA: Diagnosis not present

## 2016-04-18 DIAGNOSIS — I10 Essential (primary) hypertension: Secondary | ICD-10-CM | POA: Diagnosis not present

## 2016-04-18 DIAGNOSIS — Z6841 Body Mass Index (BMI) 40.0 and over, adult: Secondary | ICD-10-CM | POA: Diagnosis not present

## 2016-04-18 DIAGNOSIS — Z9889 Other specified postprocedural states: Secondary | ICD-10-CM

## 2016-04-18 DIAGNOSIS — D509 Iron deficiency anemia, unspecified: Secondary | ICD-10-CM | POA: Insufficient documentation

## 2016-04-18 DIAGNOSIS — E1122 Type 2 diabetes mellitus with diabetic chronic kidney disease: Secondary | ICD-10-CM | POA: Diagnosis not present

## 2016-04-18 DIAGNOSIS — I2510 Atherosclerotic heart disease of native coronary artery without angina pectoris: Secondary | ICD-10-CM | POA: Diagnosis not present

## 2016-04-18 HISTORY — PX: PARATHYROIDECTOMY: SHX19

## 2016-04-18 LAB — GLUCOSE, CAPILLARY
GLUCOSE-CAPILLARY: 319 mg/dL — AB (ref 65–99)
GLUCOSE-CAPILLARY: 364 mg/dL — AB (ref 65–99)
Glucose-Capillary: 87 mg/dL (ref 65–99)
Glucose-Capillary: 137 mg/dL — ABNORMAL HIGH (ref 65–99)
Glucose-Capillary: 143 mg/dL — ABNORMAL HIGH (ref 65–99)
Glucose-Capillary: 341 mg/dL — ABNORMAL HIGH (ref 65–99)
Glucose-Capillary: 380 mg/dL — ABNORMAL HIGH (ref 65–99)

## 2016-04-18 LAB — CALCIUM: Calcium: 9.8 mg/dL (ref 8.9–10.3)

## 2016-04-18 SURGERY — PARATHYROIDECTOMY
Anesthesia: General | Laterality: Right

## 2016-04-18 MED ORDER — ACETAMINOPHEN 10 MG/ML IV SOLN
INTRAVENOUS | Status: AC
  Filled 2016-04-18: qty 100

## 2016-04-18 MED ORDER — FAMOTIDINE 20 MG PO TABS
20.0000 mg | ORAL_TABLET | Freq: Once | ORAL | Status: AC
  Administered 2016-04-18: 20 mg via ORAL

## 2016-04-18 MED ORDER — DEXTROSE-NACL 5-0.45 % IV SOLN
INTRAVENOUS | Status: DC
  Administered 2016-04-18: 1000 mL via INTRAVENOUS

## 2016-04-18 MED ORDER — HYDRALAZINE HCL 20 MG/ML IJ SOLN: 10.0000 mg | Freq: Four times a day (QID) | INTRAMUSCULAR | Status: DC | PRN

## 2016-04-18 MED ORDER — SODIUM CHLORIDE 0.9 % IV SOLN: Freq: Once | INTRAVENOUS | Status: DC

## 2016-04-18 MED ORDER — PRAVASTATIN SODIUM 40 MG PO TABS
40.0000 mg | ORAL_TABLET | Freq: Every day | ORAL | Status: DC
  Administered 2016-04-18: 40 mg via ORAL
  Filled 2016-04-18: qty 1

## 2016-04-18 MED ORDER — ACETAMINOPHEN 650 MG RE SUPP: 650.0000 mg | RECTAL | Status: DC | PRN

## 2016-04-18 MED ORDER — INSULIN ASPART 100 UNIT/ML ~~LOC~~ SOLN
0.0000 [IU] | Freq: Three times a day (TID) | SUBCUTANEOUS | Status: DC
  Administered 2016-04-18: 20 [IU] via SUBCUTANEOUS
  Filled 2016-04-18: qty 20

## 2016-04-18 MED ORDER — SUCCINYLCHOLINE CHLORIDE 20 MG/ML IJ SOLN
INTRAMUSCULAR | Status: DC | PRN
  Administered 2016-04-18: 100 mg via INTRAVENOUS

## 2016-04-18 MED ORDER — EPHEDRINE SULFATE 50 MG/ML IJ SOLN
INTRAMUSCULAR | Status: DC | PRN
  Administered 2016-04-18: 5 mg via INTRAVENOUS
  Administered 2016-04-18: 10 mg via INTRAVENOUS
  Administered 2016-04-18 (×4): 5 mg via INTRAVENOUS

## 2016-04-18 MED ORDER — LOSARTAN POTASSIUM 50 MG PO TABS
100.0000 mg | ORAL_TABLET | Freq: Every day | ORAL | Status: DC
  Administered 2016-04-18: 100 mg via ORAL
  Filled 2016-04-18: qty 2

## 2016-04-18 MED ORDER — FENTANYL CITRATE (PF) 100 MCG/2ML IJ SOLN
INTRAMUSCULAR | Status: DC | PRN
  Administered 2016-04-18: 25 ug via INTRAVENOUS
  Administered 2016-04-18: 50 ug via INTRAVENOUS
  Administered 2016-04-18: 25 ug via INTRAVENOUS

## 2016-04-18 MED ORDER — INSULIN LISPRO 100 UNIT/ML ~~LOC~~ SOLN: 150.0000 [IU] | Freq: Two times a day (BID) | SUBCUTANEOUS | Status: DC

## 2016-04-18 MED ORDER — HYDROCODONE-ACETAMINOPHEN 10-325 MG PO TABS
1.0000 | ORAL_TABLET | ORAL | Status: DC | PRN
  Administered 2016-04-18: 1 via ORAL
  Filled 2016-04-18: qty 1

## 2016-04-18 MED ORDER — HYDRALAZINE HCL 20 MG/ML IJ SOLN
INTRAMUSCULAR | Status: AC
  Filled 2016-04-18: qty 1

## 2016-04-18 MED ORDER — GABAPENTIN 600 MG PO TABS
600.0000 mg | ORAL_TABLET | Freq: Two times a day (BID) | ORAL | Status: DC
  Administered 2016-04-18 – 2016-04-19 (×2): 600 mg via ORAL
  Filled 2016-04-18 (×2): qty 1

## 2016-04-18 MED ORDER — CALCIUM CARBONATE ANTACID 500 MG PO CHEW
1000.0000 mg | CHEWABLE_TABLET | Freq: Three times a day (TID) | ORAL | Status: DC
  Administered 2016-04-18 – 2016-04-19 (×4): 1000 mg via ORAL
  Filled 2016-04-18 (×5): qty 5

## 2016-04-18 MED ORDER — LIDOCAINE HCL (PF) 2 % IJ SOLN
INTRAMUSCULAR | Status: AC
  Filled 2016-04-18: qty 2

## 2016-04-18 MED ORDER — ONDANSETRON HCL 4 MG/2ML IJ SOLN
INTRAMUSCULAR | Status: AC
Start: 2016-04-18 — End: 2016-04-18
  Filled 2016-04-18: qty 2

## 2016-04-18 MED ORDER — SODIUM CHLORIDE 0.9 % IV SOLN
INTRAVENOUS | Status: DC
  Administered 2016-04-18: 07:00:00 via INTRAVENOUS

## 2016-04-18 MED ORDER — PROPOFOL 10 MG/ML IV BOLUS
INTRAVENOUS | Status: AC
  Filled 2016-04-18: qty 20

## 2016-04-18 MED ORDER — FENTANYL CITRATE (PF) 100 MCG/2ML IJ SOLN
25.0000 ug | INTRAMUSCULAR | Status: DC | PRN
  Administered 2016-04-18 (×4): 25 ug via INTRAVENOUS

## 2016-04-18 MED ORDER — ZOLPIDEM TARTRATE 5 MG PO TABS: 5.0000 mg | ORAL_TABLET | Freq: Every evening | ORAL | Status: DC | PRN

## 2016-04-18 MED ORDER — DEXAMETHASONE SODIUM PHOSPHATE 10 MG/ML IJ SOLN
INTRAMUSCULAR | Status: AC
  Filled 2016-04-18: qty 1

## 2016-04-18 MED ORDER — FAMOTIDINE 20 MG PO TABS
ORAL_TABLET | ORAL | Status: AC
  Administered 2016-04-18: 20 mg via ORAL
  Filled 2016-04-18: qty 1

## 2016-04-18 MED ORDER — INSULIN ASPART 100 UNIT/ML ~~LOC~~ SOLN
0.0000 [IU] | Freq: Three times a day (TID) | SUBCUTANEOUS | Status: DC
  Administered 2016-04-18 – 2016-04-19 (×2): 20 [IU] via SUBCUTANEOUS
  Filled 2016-04-18 (×2): qty 20

## 2016-04-18 MED ORDER — SUCCINYLCHOLINE CHLORIDE 20 MG/ML IJ SOLN
INTRAMUSCULAR | Status: AC
  Filled 2016-04-18: qty 1

## 2016-04-18 MED ORDER — SENNOSIDES-DOCUSATE SODIUM 8.6-50 MG PO TABS
2.0000 | ORAL_TABLET | Freq: Every day | ORAL | Status: DC
  Administered 2016-04-18: 2 via ORAL
  Filled 2016-04-18: qty 2

## 2016-04-18 MED ORDER — PHENYLEPHRINE HCL 10 MG/ML IJ SOLN
INTRAMUSCULAR | Status: AC
  Filled 2016-04-18: qty 1

## 2016-04-18 MED ORDER — INSULIN ASPART 100 UNIT/ML ~~LOC~~ SOLN: 100.0000 [IU] | Freq: Two times a day (BID) | SUBCUTANEOUS | Status: DC

## 2016-04-18 MED ORDER — FENTANYL CITRATE (PF) 100 MCG/2ML IJ SOLN
INTRAMUSCULAR | Status: AC
  Filled 2016-04-18: qty 2

## 2016-04-18 MED ORDER — MIDAZOLAM HCL 2 MG/2ML IJ SOLN
INTRAMUSCULAR | Status: AC
  Filled 2016-04-18: qty 2

## 2016-04-18 MED ORDER — ONDANSETRON HCL 4 MG PO TABS: 4.0000 mg | ORAL_TABLET | ORAL | Status: DC | PRN

## 2016-04-18 MED ORDER — MORPHINE SULFATE (PF) 2 MG/ML IV SOLN
2.0000 mg | INTRAVENOUS | Status: DC | PRN
  Administered 2016-04-18: 2 mg via INTRAVENOUS
  Filled 2016-04-18: qty 2

## 2016-04-18 MED ORDER — ONDANSETRON HCL 4 MG/2ML IJ SOLN
INTRAMUSCULAR | Status: DC | PRN
  Administered 2016-04-18: 4 mg via INTRAVENOUS

## 2016-04-18 MED ORDER — PROPOFOL 10 MG/ML IV BOLUS
INTRAVENOUS | Status: DC | PRN
  Administered 2016-04-18: 200 mg via INTRAVENOUS

## 2016-04-18 MED ORDER — FUROSEMIDE 80 MG PO TABS
80.0000 mg | ORAL_TABLET | Freq: Every day | ORAL | Status: DC
  Administered 2016-04-18 – 2016-04-19 (×2): 80 mg via ORAL
  Filled 2016-04-18 (×2): qty 1

## 2016-04-18 MED ORDER — FENTANYL CITRATE (PF) 100 MCG/2ML IJ SOLN
INTRAMUSCULAR | Status: AC
  Administered 2016-04-18: 25 ug via INTRAVENOUS
  Filled 2016-04-18: qty 2

## 2016-04-18 MED ORDER — ASPIRIN 81 MG PO CHEW
81.0000 mg | CHEWABLE_TABLET | Freq: Every day | ORAL | Status: DC
  Administered 2016-04-18: 81 mg via ORAL
  Filled 2016-04-18 (×2): qty 1

## 2016-04-18 MED ORDER — LIDOCAINE-EPINEPHRINE 1 %-1:100000 IJ SOLN
INTRAMUSCULAR | Status: AC
  Filled 2016-04-18: qty 1

## 2016-04-18 MED ORDER — ONDANSETRON HCL 4 MG/2ML IJ SOLN: 4.0000 mg | INTRAMUSCULAR | Status: DC | PRN

## 2016-04-18 MED ORDER — INSULIN DETEMIR 100 UNIT/ML ~~LOC~~ SOLN
200.0000 [IU] | Freq: Every day | SUBCUTANEOUS | Status: DC
  Filled 2016-04-18: qty 2

## 2016-04-18 MED ORDER — ACETAMINOPHEN 10 MG/ML IV SOLN
INTRAVENOUS | Status: DC | PRN
  Administered 2016-04-18: 1000 mg via INTRAVENOUS

## 2016-04-18 MED ORDER — LIDOCAINE HCL (CARDIAC) 20 MG/ML IV SOLN
INTRAVENOUS | Status: DC | PRN
  Administered 2016-04-18: 80 mg via INTRAVENOUS

## 2016-04-18 MED ORDER — DEXTROSE-NACL 5-0.45 % IV SOLN
INTRAVENOUS | Status: DC
  Administered 2016-04-18: 12:00:00 via INTRAVENOUS

## 2016-04-18 MED ORDER — DEXAMETHASONE SODIUM PHOSPHATE 10 MG/ML IJ SOLN
INTRAMUSCULAR | Status: DC | PRN
  Administered 2016-04-18: 5 mg via INTRAVENOUS

## 2016-04-18 MED ORDER — PHENYLEPHRINE HCL 10 MG/ML IJ SOLN
INTRAMUSCULAR | Status: DC | PRN
  Administered 2016-04-18: 100 ug via INTRAVENOUS
  Administered 2016-04-18 (×2): 200 ug via INTRAVENOUS
  Administered 2016-04-18: 100 ug via INTRAVENOUS

## 2016-04-18 MED ORDER — EPHEDRINE SULFATE 50 MG/ML IJ SOLN
INTRAMUSCULAR | Status: AC
  Filled 2016-04-18: qty 1

## 2016-04-18 MED ORDER — AMLODIPINE BESYLATE 5 MG PO TABS
5.0000 mg | ORAL_TABLET | Freq: Every day | ORAL | Status: DC
  Administered 2016-04-18: 5 mg via ORAL
  Filled 2016-04-18: qty 1

## 2016-04-18 MED ORDER — MIDAZOLAM HCL 2 MG/2ML IJ SOLN
INTRAMUSCULAR | Status: DC | PRN
  Administered 2016-04-18: 1 mg via INTRAVENOUS

## 2016-04-18 MED ORDER — ONDANSETRON HCL 4 MG/2ML IJ SOLN: 4.0000 mg | Freq: Once | INTRAMUSCULAR | Status: DC | PRN

## 2016-04-18 MED ORDER — ALLOPURINOL 100 MG PO TABS
300.0000 mg | ORAL_TABLET | Freq: Every day | ORAL | Status: DC
  Administered 2016-04-18: 300 mg via ORAL
  Filled 2016-04-18 (×2): qty 3

## 2016-04-18 MED ORDER — INSULIN REGULAR HUMAN 100 UNIT/ML IJ SOLN
5.0000 [IU] | Freq: Once | INTRAMUSCULAR | Status: AC
  Administered 2016-04-19: 5 [IU] via INTRAVENOUS
  Filled 2016-04-18 (×2): qty 0.05

## 2016-04-18 MED ORDER — GLYCOPYRROLATE 0.2 MG/ML IJ SOLN
INTRAMUSCULAR | Status: DC | PRN
  Administered 2016-04-18: 0.2 mg via INTRAVENOUS

## 2016-04-18 MED ORDER — ACETAMINOPHEN 160 MG/5ML PO SOLN
650.0000 mg | ORAL | Status: DC | PRN
  Administered 2016-04-19: 650 mg via ORAL
  Filled 2016-04-18 (×3): qty 20.3

## 2016-04-18 SURGICAL SUPPLY — 45 items
ADH SKN CLS APL DERMABOND .7 (GAUZE/BANDAGES/DRESSINGS) ×1
BLADE SURG 15 STRL LF DISP TIS (BLADE) ×1 IMPLANT
BLADE SURG 15 STRL SS (BLADE) ×2
CANISTER SUCT 1200ML W/VALVE (MISCELLANEOUS) ×2 IMPLANT
CNTNR SPEC C3OZ STD GRAD LEK (MISCELLANEOUS) IMPLANT
CONT SPEC 3OZ W/LID STRL (MISCELLANEOUS)
DERMABOND ADVANCED (GAUZE/BANDAGES/DRESSINGS) ×1
DERMABOND ADVANCED .7 DNX12 (GAUZE/BANDAGES/DRESSINGS) ×1 IMPLANT
DRAPE MAG INST 16X20 L/F (DRAPES) ×2 IMPLANT
DRSG TEGADERM 2-3/8X2-3/4 SM (GAUZE/BANDAGES/DRESSINGS) IMPLANT
ELECT CAUTERY BLADE TIP 2.5 (TIP) ×2
ELECT LARYNGEAL 6/7 (MISCELLANEOUS) ×2
ELECT LARYNGEAL 8/9 (MISCELLANEOUS)
ELECT REM PT RETURN 9FT ADLT (ELECTROSURGICAL) ×2
ELECTRODE CAUTERY BLDE TIP 2.5 (TIP) ×1 IMPLANT
ELECTRODE LARYNGEAL 6/7 (MISCELLANEOUS) ×1 IMPLANT
ELECTRODE LARYNGEAL 8/9 (MISCELLANEOUS) ×1 IMPLANT
ELECTRODE REM PT RTRN 9FT ADLT (ELECTROSURGICAL) ×1 IMPLANT
FORCEPS JEWEL BIP 4-3/4 STR (INSTRUMENTS) ×2 IMPLANT
GLOVE BIO SURGEON STRL SZ7.5 (GLOVE) ×4 IMPLANT
GOWN STRL REUS W/ TWL LRG LVL3 (GOWN DISPOSABLE) ×3 IMPLANT
GOWN STRL REUS W/TWL LRG LVL3 (GOWN DISPOSABLE) ×6
HARMONIC SCALPEL FOCUS (MISCELLANEOUS) ×2 IMPLANT
HEMOSTAT SURGICEL 2X3 (HEMOSTASIS) ×2 IMPLANT
HOOK STAY BLUNT/RETRACTOR 5M (MISCELLANEOUS) ×2 IMPLANT
JACKSON PRATT 7MM (INSTRUMENTS) IMPLANT
KIT RM TURNOVER STRD PROC AR (KITS) ×2 IMPLANT
LABEL OR SOLS (LABEL) ×1 IMPLANT
MARKER SKIN DUAL TIP RULER LAB (MISCELLANEOUS) ×2 IMPLANT
NS IRRIG 500ML POUR BTL (IV SOLUTION) ×2 IMPLANT
PACK HEAD/NECK (MISCELLANEOUS) ×2 IMPLANT
PROBE NEUROSIGN BIPOL (MISCELLANEOUS) ×1 IMPLANT
PROBE NEUROSIGN BIPOLAR (MISCELLANEOUS) ×2
SPONGE KITTNER 5P (MISCELLANEOUS) ×2 IMPLANT
SPONGE XRAY 4X4 16PLY STRL (MISCELLANEOUS) ×2 IMPLANT
STRIP CLOSURE SKIN 1/4X4 (GAUZE/BANDAGES/DRESSINGS) ×1 IMPLANT
SUT ETH BLK MONO 3 0 FS 1 12/B (SUTURE) ×2 IMPLANT
SUT PROLENE 5 0 PS 3 (SUTURE) ×2 IMPLANT
SUT SILK 2 0 (SUTURE) ×2
SUT SILK 2 0 SH (SUTURE) ×3 IMPLANT
SUT SILK 2-0 18XBRD TIE 12 (SUTURE) ×1 IMPLANT
SUT SILK 3 0 (SUTURE)
SUT SILK 3-0 18XBRD TIE 12 (SUTURE) ×1 IMPLANT
SUT VIC AB 4-0 RB1 18 (SUTURE) ×2 IMPLANT
SYR 3ML LL SCALE MARK (SYRINGE) ×2 IMPLANT

## 2016-04-18 NOTE — H&P (Signed)
..  History and Physical paper copy reviewed and updated date of procedure and will be scanned into system.  Patient seen and examined.  

## 2016-04-18 NOTE — Anesthesia Postprocedure Evaluation (Signed)
Anesthesia Post Note  Patient: Donald [name]  Procedure(s) Performed: Procedure(s) (LRB): PARATHYROIDECTOMY (Right)  Patient location during evaluation: PACU Anesthesia Type: General Level of consciousness: awake and alert Pain management: pain level controlled Vital Signs Assessment: post-procedure vital signs reviewed and stable Respiratory status: spontaneous breathing, nonlabored ventilation, respiratory function stable and patient connected to nasal cannula oxygen Cardiovascular status: blood pressure returned to baseline and stable Postop Assessment: no signs of nausea or vomiting Anesthetic complications: no     Last Vitals:  Vitals:   04/18/16 0959 04/18/16 1113  BP: (!) 141/55 (!) 124/35  Pulse: 61 (!) 58  Resp: (!) 21 18  Temp: 36.6 C 36.9 C    Last Pain:  Vitals:   04/18/16 1113  TempSrc: Oral  PainSc:                  [name]

## 2016-04-18 NOTE — Progress Notes (Addendum)
RN notified prime doc that pt.'s CBG at 2331 was 319 and pt.'s current BP is 133/36. New orders placed by prime doctor and RN will recheck CBG at [place] as ordered. Pt is asymptomatic. Will continue to monitor pt closely.  [place] Doughman [employer]

## 2016-04-18 NOTE — Op Note (Signed)
....  04/18/2016  8:52 AM    [name], Donald Miller  [id number]   Pre-Op Dx: Hyperparathyroidism  Post-op Dx: SAME  Proc: Right InferiorParathyroidectomy  Surg: [name]  Assistant: Anda Latina  Anes: GOT  EBL: <5ccs  Comp: None  Indications: Primary Hyperparathyroidism with localizing SPECT to right inferior  Findings:Large right inferior parathyroid adenoma identified in the inferior-posterior aspect of the right hemi-thryoid and removed without complications.  Frozen specimen consistent with hypercellular parathyroid adenoma.  Description of Procedure: After the patient was identified in hold and the history and physical and consent was reviewed and updated. The patient was marked in an upright position along a natural occuring skin crease. The patient was next taken to the operating room and placed in a supine position. General endotracheal anesthesia was induced with laryngeal monitor endotracheal tube.  Direct visualization by the surgeon of the tube electrodes in contact with the vocal cords was mad.. The patient's anterior marked neck crease was neck injected with 9cc's of 1% Lidocaine with 1:100,000 Epinephrine. The patient was next prepped and draped in a sterile normal fashion.  At this time, a 15 blade scalpel was used to make a skin incision along a previously marked anterior neck crease. Dissection was carefully performed through the subcutaneous tissues with combination of Bovie electrocautery and blunt dissection.  The platysma was incised and anterior neck veins ligated with harmonic scalpel.  The median raphe of the strap muscles was divided in a linear fashion with Bovie electrocautery until the anterior border of the thyroid gland was identified.  The sternohyoid and sternothyroid muscle was bluntly dissected away from the large right hemithyroid gland.  The lateral border of the thyroid and the carotid artery was identified.  Dissection bluntly and  with Harmonic Scalpel was continued superiorly and inferiorly along the lateral edge of the thyroid.  Along the ifnerior aspect of the right inferior thyroid posterior thyroid a mass was noted just lateral to the thyroid gland.  This was noted to be separate from the thyroid.  Careful dissection in this area revealed a caramel colored mass separate from the thyroid gland that was encapsulated.  This was circumferentially dissected with blunt techniques until it was pedicled on an inferior and medial coursing blood vessel.  This was ligated using the Harmonic scalpel after ensuring there was not stimulation of the recurrent laryngeal nerve.  The mass was passed off the table for frozen pathological evaluation.  Frozen section was consistent with a hypercellular parathyroid adenoma.  The wound was copiously irrigated with sterile saline. Meticulous hemostasis with was obtained.  The wound was evaluated for any additional masses or lesions.  The right recurrent laryngeal nerve was not identified.  Surgicel was placed within the wound bed and the strap muscles were reapproximated with a single 4.0 vicryl stitch in a figure of eight fashion.  The subcutaneous skin was closed in an interupted fashion with subdermal 4.0 vicryl.  At this time, the skin was closed with Dermabond skin adhesive and topped with a steristrip.  At this time the patient was extubated and taken to PACU in good condition.  Plan: Follow pathology. Replace calcium on taper for 3 weeks.  Limit activity for 2 weeks. Follow up next week for post-operative evaluation.  [name]  04/18/2016 8:52 AM

## 2016-04-18 NOTE — Progress Notes (Signed)
..   04/18/2016 5:46 PM  [name], Donald Miller [id number]  Post-Op Day 0    Temp:  [97.6 F (36.4 C)-98.6 F (37 C)] 98.6 F (37 C) (04/05 1248) Pulse Rate:  [56-68] 64 (04/05 1303) Resp:  [18-31] 20 (04/05 1248) BP: (110-243)/(29-161) 126/61 (04/05 1303) SpO2:  [91 %-100 %] 94 % (04/05 1248) Weight:  [127.5 kg (281 lb)] 127.5 kg (281 lb) (04/05 0646),     Intake/Output Summary (Last 24 hours) at 04/18/16 1746 Last data filed at 04/18/16 1352  Gross per 24 hour  Intake             1510 ml  Output                3 ml  Net             1507 ml    Results for orders placed or performed during the hospital encounter of 04/18/16 (from the past 24 hour(s))  Glucose, capillary     Status: None   Collection Time: 04/18/16  6:25 AM  Result Value Ref Range   Glucose-Capillary 87 65 - 99 mg/dL  Glucose, capillary     Status: Abnormal   Collection Time: 04/18/16  9:15 AM  Result Value Ref Range   Glucose-Capillary 137 (H) 65 - 99 mg/dL  Calcium     Status: None   Collection Time: 04/18/16 11:14 AM  Result Value Ref Range   Calcium 9.8 8.9 - 10.3 mg/dL  Glucose, capillary     Status: Abnormal   Collection Time: 04/18/16 11:27 AM  Result Value Ref Range   Glucose-Capillary 143 (H) 65 - 99 mg/dL  Glucose, capillary     Status: Abnormal   Collection Time: 04/18/16  4:47 PM  Result Value Ref Range   Glucose-Capillary 364 (H) 65 - 99 mg/dL    SUBJECTIVE:  Some pain with neck but mostly still lower back pain.  Patient reports he usually gets epi-dural injections and is overdue for lower back injection.  Increase in glucose at 364.  Patient normally takes 260units at bedtime as appears to be very insulin resistant.  BP improved from before but still lightheaded with walking.  OBJECTIVE:  GEN- sitting upright in bed.  NAD NECK-  Incision c/d/I with steri-strip in place    IMPRESSION:  s/p Parathyroidectomy POD#0  PLAN:  Patient reports continues to be unable to ambulate without  significant assistance due to lightheadedness.  Will keep overnight.  Will get Medicine involved for help with management of his glucose as well as HTN overnight.  Continue the 10mg /325 Hydrocodone as needed.  Will saline lock his fluids and advance diet to soft.  [name] 04/18/2016, 5:46 PM

## 2016-04-18 NOTE — Progress Notes (Signed)
Pt.'s CBG was checked at this time and was 341, pt does not have coverage due for bedtime and just has Levemir 200 units to give at 2200. Prime doctor notified of Levemir coverage and no sliding scale coverage at this time. New orders to not give Levemir 200 units and to modify insulin sliding scale to three times daily before meals and at bedtime. Will continue to monitor pt closely.  Mical Brun [employer]

## 2016-04-18 NOTE — Anesthesia Preprocedure Evaluation (Signed)
Anesthesia Evaluation  Patient identified by MRN, date of birth, ID band Patient awake    Reviewed: Allergy & Precautions, NPO status , Patient's Chart, lab work & pertinent test results, reviewed documented beta blocker date and time   Airway Mallampati: III  TM Distance: >3 FB     Dental  (+) Chipped   Pulmonary shortness of breath, asthma , sleep apnea and Continuous Positive Airway Pressure Ventilation ,           Cardiovascular hypertension, Pt. on medications + CAD and + Peripheral Vascular Disease       Neuro/Psych PSYCHIATRIC DISORDERS Anxiety Depression  Neuromuscular disease    GI/Hepatic GERD  Controlled,  Endo/Other  diabetes, Type 2Morbid obesity  Renal/GU Renal disease     Musculoskeletal  (+) Arthritis ,   Abdominal   Peds  Hematology  (+) anemia ,   Anesthesia Other Findings Gout.  Reproductive/Obstetrics                             Anesthesia Physical Anesthesia Plan  ASA: III  Anesthesia Plan: General   Post-op Pain Management:    Induction: Intravenous  Airway Management Planned: Oral ETT  Additional Equipment:   Intra-op Plan:   Post-operative Plan:   Informed Consent: I have reviewed the patients History and Physical, chart, labs and discussed the procedure including the risks, benefits and alternatives for the proposed anesthesia with the patient or authorized representative who has indicated his/her understanding and acceptance.     Plan Discussed with: CRNA  Anesthesia Plan Comments:         Anesthesia Quick Evaluation

## 2016-04-18 NOTE — Consult Note (Signed)
Medical Consultation  Donald [name] PFX:[id number] DOB: [birth year] DOA: 04/18/2016 PCP: Renato Shin, MD   Requesting physician: *Dr Pryor Ochoa Date of consultation: 04/18/2016 Reason for consultation:  Medical managment  Impression/Recommendations  71 year old morbidly obese male with a history of diabetes, hypertension, OSA on CPAP postop day #0 for parathyroidectomy  1. Diabetes: Patient is now eating and would restart home medications. I will decrease the dose of his insulin until I know that he is eating well. Diabetes coordinator consultation Check Aic  2. Essential hypertension: Patient did not take his blood pressure medications this morning and he has had fluctuance in blood pressure today. He no longer is complaining of dizziness. Continue outpatient medications with Norvasc and losartan  3. OSA: Continue CPAP at night  4. Hyperlipidemia: Continue statin  5. Chronic anemia iron deficiency  6 chronic kidney disease stage III: Patient's baseline creatinine 1.4   Chief Complaint: Dizziness  HPI:   71 year old male postop day #0 status post parathyroidectomy with history of essential hypertension, diabetes and OSA. Apparently after surgery patient had fluctuant in blood pressure. He was given hydralazine and subsequently felt dizzy. He is no longer complaining of dizziness.  His last blood pressure recorded was 126/61. He did not take blood pressure medications this morning.  Hospital service was consulted for diabetes and hypertension management Review of Systems  Constitutional: Negative for fever, chills weight loss HENT: Negative for ear pain, nosebleeds, congestion, facial swelling, rhinorrhea, neck pain, neck stiffness and ear discharge.   Respiratory: Negative for cough, shortness of breath, wheezing  Cardiovascular: Negative for chest pain, palpitations and leg swelling.  Gastrointestinal: Negative for heartburn, abdominal pain, vomiting, diarrhea or  consitpation Genitourinary: Negative for dysuria, urgency, frequency, hematuria Musculoskeletal: He has pain r joint pain Neurological: Negative for dizziness, seizures, syncope, focal weakness,  numbness and headaches.  Hematological: Does not bruise/bleed easily.  Psychiatric/Behavioral: Negative for hallucinations, confusion, dysphoric mood   Past Medical History:  Diagnosis Date  . ANEMIA-IRON DEFICIENCY 07/26/2006  . Anxiety   . ASTHMA 07/26/2006  . Asthma   . Back pain, chronic   . Bell's palsy   . Chronic kidney disease    stage 3 renal failure  . COLONIC POLYPS, HX OF 07/26/2006  . D V T 12/03/2007  . DEPRESSION 03/14/2009  . DIABETES MELLITUS, TYPE II 07/26/2006  . [place] DISEASE, LUMBAR 10/05/2007  . DYSLIPIDEMIA 04/13/2009  . Edema    ankle  . GERD 07/26/2006  . Gout   . Heart murmur   . History of kidney stones   . HYPERTENSION 07/26/2006  . INSOMNIA 08/21/2007  . Neuropathy ([place])   . OBSTRUCTIVE SLEEP APNEA 12/03/2007   Use C-PAP  . PERIPHERAL NEUROPATHY 07/26/2006  . Pernicious anemia 11/20/2006  . PULMONARY EMBOLISM 10/05/2007  . Renal insufficiency   . Shortness of breath dyspnea    Past Surgical History:  Procedure Laterality Date  . APPENDECTOMY  1968  . [place], 06/14/2008   Dr. Trenton Gammon at [facility] (06/10)  . CARDIAC CATHETERIZATION    . CARPAL TUNNEL RELEASE Right   . CATARACT EXTRACTION W/PHACO Left 05/30/2014   Procedure: CATARACT EXTRACTION PHACO AND INTRAOCULAR LENS PLACEMENT (IOC);  Surgeon: Estill Cotta, MD;  Location: [facility] ORS;  Service: Ophthalmology;  Laterality: Left;  [name] 01:20 AP% 23.7 CDE 31.86  . EYE SURGERY Left    Catarct Extraction with IOL  . LITHOTRIPSY     X 2  . PARATHYROIDECTOMY Right 04/18/2016   Procedure: PARATHYROIDECTOMY;  Surgeon:  [name], MD;  Location: [facility] ORS;  Service: ENT;  Laterality: Right;  . SHOULDER ARTHROSCOPY W/ ROTATOR CUFF REPAIR Right   . TONSILLECTOMY     Social History:  reports  that he has never smoked. He has never used smokeless tobacco. He reports that he does not drink alcohol or use drugs.  Allergies  Allergen Reactions  . Codeine     REACTION: chest pain  . Pioglitazone     REACTION to Actos: swelling in ankles   Family History  Problem Relation Age of Onset  . Cancer Mother     Breast Cancer  . Cancer Sister     Breast Cancer  . Diabetes Father   . Heart disease Father   . Diabetes Paternal Grandfather     Prior to Admission medications   Medication Sig Start Date End Date Taking? Authorizing Provider  allopurinol (ZYLOPRIM) 300 MG tablet TAKE ONE TABLET BY MOUTH EVERY DAY 02/19/16  Yes Renato Shin, MD  ALPRAZolam Duanne Moron) 1 MG tablet TAKE ONE TABLET AT BEDTIME AS NEEDED FORSLEEP 04/15/16  Yes Renato Shin, MD  amLODipine (NORVASC) 5 MG tablet TAKE ONE TABLET AT BEDTIME 12/04/15  Yes Renato Shin, MD  aspirin 81 MG chewable tablet Chew 81 mg by mouth daily.   Yes Historical Provider, MD  colchicine (COLCRYS) 0.6 MG tablet 1 tab every hour until symptoms are better, max of 6 per day.  Stop if diarrhea. Patient taking differently: Take 0.6 mg by mouth every 4 (four) hours as needed. 1 tab every hour until symptoms are better, max of 6 per day.  Stop if diarrhea. 12/14/15  Yes Renato Shin, MD  cyanocobalamin (,VITAMIN B-12,) 1000 MCG/ML injection Inject 1,000 mcg into the muscle See admin instructions. 1 injection every 3 months    Yes Historical Provider, MD  furosemide (LASIX) 80 MG tablet TAKE ONE TABLET BY MOUTH EVERY DAY 09/05/15  Yes Renato Shin, MD  gabapentin (NEURONTIN) 600 MG tablet Take 600 mg by mouth 2 (two) times daily.   Yes Historical Provider, MD  HYDROcodone-acetaminophen (NORCO) 10-325 MG tablet Take 1 tablet by mouth every 4 (four) hours as needed for moderate pain.   Yes Historical Provider, MD  insulin lispro (HUMALOG) 100 UNIT/ML injection 200 units with breakfast 180 units with lunch and 260 units with the supper 02/28/16  Yes Renato Shin, MD  insulin NPH Human (NOVOLIN N) 100 UNIT/ML injection Inject 2.8 mLs (280 Units total) into the skin at bedtime. 03/14/16  Yes Renato Shin, MD  losartan (COZAAR) 100 MG tablet TAKE ONE TABLET BY MOUTH AT BEDTIME 09/05/15  Yes Renato Shin, MD  lovastatin (MEVACOR) 40 MG tablet TAKE TWO TABLETS AT BEDTIME 10/11/15  Yes Renato Shin, MD  Naphazoline-Pheniramine (OPCON-A OP) Place 1 drop into both eyes 2 (two) times daily as needed.   Yes Historical Provider, MD  Sennosides-Docusate Sodium (STOOL SOFTENER & LAXATIVE PO) Take 2 tablets by mouth at bedtime.   Yes Historical Provider, MD  GE100 BLOOD GLUCOSE TEST test strip TEST 3-4 TIMES DAILY 10/19/15   Renato Shin, MD    Physical Exam: Blood pressure 126/61, pulse 64, temperature 98.6 F (37 C), temperature source Oral, resp. rate 20, height 5\' 6"  (1.676 m), weight 127.5 kg (281 lb), SpO2 94 %. @VITALS2 @ Filed Weights   04/18/16 0646  Weight: 127.5 kg (281 lb)    Intake/Output Summary (Last 24 hours) at 04/18/16 1810 Last data filed at 04/18/16 1352  Gross per 24 hour  Intake  1510 ml  Output                3 ml  Net             1507 ml     Constitutional: Morbid obesity No distress. HENT: Normocephalic. Marland Kitchen Oropharynx is clear and moist.  Eyes: Conjunctivae and EOM are normal. PERRLA, no scleral icterus.  Neck: Normal ROM. Neck supple. No JVD. No tracheal deviation. CVS: RRR, S1/S2 +, no murmurs, no gallops, no carotid bruit.  Pulmonary: Effort and breath sounds normal, no stridor, rhonchi, wheezes, rales.  Abdominal: Soft. BS +,  no distension, tenderness, rebound or guarding.  Musculoskeletal: Normal range of motion. 1+ bilateral lower extremity edema and no tenderness.  Neuro: Alert. CN 2-12 grossly intact. No focal deficits. Skin: Skin is warm and dry. No rash noted. Psychiatric: Normal mood and affect.    Labs  Basic Metabolic Panel:  Recent Labs Lab 04/15/16 1413 04/18/16 1114  NA 142  --   K 4.2   --   CL 106  --   CO2 29  --   GLUCOSE 150*  --   BUN 28*  --   CREATININE 1.44*  --   CALCIUM 10.7* 9.8   Liver Function Tests: No results for input(s): AST, ALT, ALKPHOS, BILITOT, PROT, ALBUMIN in the last 168 hours. No results for input(s): LIPASE, AMYLASE in the last 168 hours.  CBC: No results for input(s): WBC, NEUTROABS, HGB, HCT, MCV, PLT in the last 168 hours. Cardiac Enzymes: No results for input(s): CKTOTAL, CKMB, CKMBINDEX, TROPONINI in the last 168 hours. BNP: Invalid input(s): POCBNP CBG:  Recent Labs Lab 04/18/16 1647  GLUCAP 364*    Radiological Exams: No results found.   DNR   Thank you for allowing me to participate in the care of your patient. We will continue to follow.   Note: This dictation was prepared with Dragon dictation along with smaller phrase technology. Any transcriptional errors that result from this process are unintentional.  Time spent: 45 minutes  Hal Norrington, MD

## 2016-04-18 NOTE — Transfer of Care (Signed)
Immediate Anesthesia Transfer of Care Note  Patient: Fotios Amos Pun  Procedure(s) Performed: Procedure(s): PARATHYROIDECTOMY (Right)  Patient Location: PACU  Anesthesia Type:General  Level of Consciousness: awake and sedated  Airway & Oxygen Therapy: Patient Spontanous Breathing and Patient connected to face mask oxygen  Post-op Assessment: Report given to RN and Post -op Vital signs reviewed and stable  Post vital signs: Reviewed and stable  Last Vitals:  Vitals:   04/18/16 0646  BP: (!) 158/39  Pulse: (!) 56  Resp: 20  Temp: 36.5 C    Last Pain:  Vitals:   04/18/16 0646  TempSrc: Oral      Patients Stated Pain Goal: 2 (07/37/10 6269)  Complications: No apparent anesthesia complications

## 2016-04-18 NOTE — Anesthesia Procedure Notes (Signed)
Procedure Name: Intubation Date/Time: 04/18/2016 7:27 AM Performed by: Johnna Acosta Pre-anesthesia Checklist: Patient identified, Emergency Drugs available, Suction available, Patient being monitored and Timeout performed Patient Re-evaluated:Patient Re-evaluated prior to inductionOxygen Delivery Method: Circle system utilized Preoxygenation: Pre-oxygenation with 100% oxygen Intubation Type: IV induction Ventilation: Two handed mask ventilation required, Mask ventilation with difficulty and Oral airway inserted - appropriate to patient size Laryngoscope Size: Glidescope and 3 Grade View: Grade II Tube type: Oral Tube size: 7.5 mm Number of attempts: 1 Airway Equipment and Method: Stylet and Video-laryngoscopy Placement Confirmation: ETT inserted through vocal cords under direct vision,  positive ETCO2 and breath sounds checked- equal and bilateral Secured at: 23 cm Tube secured with: Tape Dental Injury: Teeth and Oropharynx as per pre-operative assessment  Difficulty Due To: Difficulty was anticipated, Difficult Airway- due to large tongue, Difficult Airway- due to limited oral opening and Difficult Airway- due to reduced neck mobility Future Recommendations: Recommend- induction with short-acting agent, and alternative techniques readily available

## 2016-04-18 NOTE — Anesthesia Post-op Follow-up Note (Cosign Needed)
Anesthesia QCDR form completed.        

## 2016-04-18 NOTE — Progress Notes (Signed)
Insulin NPH interchanged to insulin detemir per protocol.  Verified insulin doses with [facility] - most recent prescriptions are for: 1. Novolin N 280 units subcutaneously once daily at bedtime 2. Humalog TID with meals - 180 units with breakfast, 150 units with lunch, 240 units with supper  The most recent outpatient note found from Endocrinology that includes doses is from 12/14/2015 which prescribes: 1. NPH 310 units subcutaneously once daily at bedtime 2. Humalog TID with meals - 200 units with breakfast, 150 units with lunch, 260 units with supper.  It looks like they modified the doses 02/28/16 (maybe to the regimen the pharmacy had), but the note didn't specify the new doses.   Jacqueli Pangallo A. [place], [place].D., BCPS Clinical Pharmacist 04/18/2016 18:08

## 2016-04-19 ENCOUNTER — Telehealth: Payer: Self-pay

## 2016-04-19 DIAGNOSIS — N183 Chronic kidney disease, stage 3 (moderate): Secondary | ICD-10-CM | POA: Diagnosis not present

## 2016-04-19 DIAGNOSIS — E785 Hyperlipidemia, unspecified: Secondary | ICD-10-CM | POA: Diagnosis not present

## 2016-04-19 DIAGNOSIS — E1165 Type 2 diabetes mellitus with hyperglycemia: Secondary | ICD-10-CM | POA: Diagnosis not present

## 2016-04-19 DIAGNOSIS — I129 Hypertensive chronic kidney disease with stage 1 through stage 4 chronic kidney disease, or unspecified chronic kidney disease: Secondary | ICD-10-CM | POA: Diagnosis not present

## 2016-04-19 DIAGNOSIS — I2510 Atherosclerotic heart disease of native coronary artery without angina pectoris: Secondary | ICD-10-CM | POA: Diagnosis not present

## 2016-04-19 DIAGNOSIS — E210 Primary hyperparathyroidism: Secondary | ICD-10-CM | POA: Diagnosis not present

## 2016-04-19 DIAGNOSIS — D351 Benign neoplasm of parathyroid gland: Secondary | ICD-10-CM | POA: Diagnosis not present

## 2016-04-19 DIAGNOSIS — G4730 Sleep apnea, unspecified: Secondary | ICD-10-CM | POA: Diagnosis not present

## 2016-04-19 DIAGNOSIS — G4733 Obstructive sleep apnea (adult) (pediatric): Secondary | ICD-10-CM | POA: Diagnosis not present

## 2016-04-19 DIAGNOSIS — I10 Essential (primary) hypertension: Secondary | ICD-10-CM | POA: Diagnosis not present

## 2016-04-19 LAB — GLUCOSE, CAPILLARY
GLUCOSE-CAPILLARY: 361 mg/dL — AB (ref 65–99)
GLUCOSE-CAPILLARY: 330 mg/dL — AB (ref 65–99)
GLUCOSE-CAPILLARY: 337 mg/dL — AB (ref 65–99)
GLUCOSE-CAPILLARY: 362 mg/dL — AB (ref 65–99)
GLUCOSE-CAPILLARY: 366 mg/dL — AB (ref 65–99)

## 2016-04-19 LAB — SURGICAL PATHOLOGY

## 2016-04-19 MED ORDER — INSULIN DETEMIR 100 UNIT/ML ~~LOC~~ SOLN
100.0000 [IU] | Freq: Once | SUBCUTANEOUS | Status: AC
  Administered 2016-04-19: 100 [IU] via SUBCUTANEOUS
  Filled 2016-04-19: qty 1

## 2016-04-19 MED ORDER — INSULIN ASPART 100 UNIT/ML ~~LOC~~ SOLN
100.0000 [IU] | Freq: Once | SUBCUTANEOUS | Status: AC
  Administered 2016-04-19: 100 [IU] via SUBCUTANEOUS

## 2016-04-19 MED ORDER — INSULIN ASPART 100 UNIT/ML ~~LOC~~ SOLN
100.0000 [IU] | Freq: Two times a day (BID) | SUBCUTANEOUS | Status: DC
  Administered 2016-04-19: 100 [IU] via SUBCUTANEOUS
  Filled 2016-04-19: qty 1
  Filled 2016-04-19: qty 100

## 2016-04-19 MED ORDER — INSULIN REGULAR HUMAN 100 UNIT/ML IJ SOLN
10.0000 [IU] | Freq: Once | INTRAMUSCULAR | Status: AC
  Administered 2016-04-19: 10 [IU] via INTRAVENOUS
  Filled 2016-04-19 (×2): qty 0.1

## 2016-04-19 MED ORDER — ONDANSETRON HCL 4 MG PO TABS: 4.0000 mg | ORAL_TABLET | ORAL | 0 refills | Status: DC | PRN

## 2016-04-19 MED ORDER — HYDROCODONE-ACETAMINOPHEN 10-325 MG PO TABS: 1.0000 | ORAL_TABLET | Freq: Four times a day (QID) | ORAL | 0 refills | Status: DC | PRN

## 2016-04-19 MED ORDER — INSULIN ASPART 100 UNIT/ML ~~LOC~~ SOLN: 25.0000 [IU] | Freq: Three times a day (TID) | SUBCUTANEOUS | Status: DC

## 2016-04-19 MED ORDER — CALCIUM CARBONATE ANTACID 500 MG PO CHEW: 1000.0000 mg | CHEWABLE_TABLET | Freq: Three times a day (TID) | ORAL | 0 refills | Status: DC

## 2016-04-19 MED ORDER — INSULIN DETEMIR 100 UNIT/ML ~~LOC~~ SOLN
100.0000 [IU] | Freq: Every day | SUBCUTANEOUS | Status: DC
  Filled 2016-04-19: qty 1

## 2016-04-19 NOTE — Consult Note (Signed)
   [facility] CM Inpatient Consult   04/19/2016  Axell Trigueros Medero [birth year] [id number]   Received a referral from inpatient diabetes coordinator for [employer] services. Patient discharged before able to speak with him about receiving [facility] services. Will send referral back to office to be contacted by office staff. Thank you for your referral.  Jaquarius Seder RN, [place] [facility] Liaison  [phone]) Business Mobile [phone]) Toll free office

## 2016-04-19 NOTE — Progress Notes (Signed)
Patient ID: Donald [name], male   DOB: Apr 10, 1945, 71 y.o.   MRN: [id number]  [facility] PROGRESS NOTE  Donald [name] QIO:[id number] DOB: 11-Jun-1945 DOA: 04/18/2016 PCP: Donald Shin, MD  HPI/Subjective: Patient feeling okay. A little sore the neck. Patient states that when he last filled his medications he got a 6 month supply. I was called to reconcile his medications. Patient was discharged by ENT this morning.  Objective: Vitals:   04/19/16 0529 04/19/16 0541  BP: (!) 131/45 (!) 138/58  Pulse: (!) 55   Resp: 18   Temp: 97.8 F (36.6 C)     Filed Weights   04/18/16 0646 04/19/16 0528  Weight: 127.5 kg (281 lb) 130.9 kg (288 lb 8 oz)    ROS: Review of Systems  Constitutional: Negative for chills and fever.  Eyes: Negative for blurred vision.  Respiratory: Negative for cough and shortness of breath.   Cardiovascular: Negative for chest pain.  Gastrointestinal: Negative for abdominal pain, constipation, diarrhea, nausea and vomiting.  Genitourinary: Negative for dysuria.  Musculoskeletal: Positive for neck pain. Negative for joint pain.  Neurological: Negative for dizziness and headaches.   Exam: Physical Exam  HENT:  Nose: No mucosal edema.  Mouth/Throat: No oropharyngeal exudate or posterior oropharyngeal edema.  Eyes: Conjunctivae, EOM and lids are normal. Pupils are equal, round, and reactive to light.  Neck: No JVD present. Carotid bruit is not present. No edema present. No thyroid mass and no thyromegaly present.  Cardiovascular: S1 normal and S2 normal.  Exam reveals no gallop.   No murmur heard. Pulses:      Dorsalis pedis pulses are 2+ on the right side, and 2+ on the left side.  Respiratory: No respiratory distress. He has no wheezes. He has no rhonchi. He has no rales.  GI: Soft. Bowel sounds are normal. There is no tenderness.  Musculoskeletal:       Right shoulder: He exhibits no swelling.  Lymphadenopathy:    He has no cervical adenopathy.   Neurological: He is alert. No cranial nerve deficit.  Skin: Skin is warm. No rash noted. Nails show no clubbing.  Psychiatric: He has a normal mood and affect.      Data Reviewed: Basic Metabolic Panel:  Recent Labs Lab 04/15/16 1413 04/18/16 1114  NA 142  --   K 4.2  --   CL 106  --   CO2 29  --   GLUCOSE 150*  --   BUN 28*  --   CREATININE 1.44*  --   CALCIUM 10.7* 9.8    CBG:  Recent Labs Lab 04/19/16 0121 04/19/16 0230 04/19/16 0257 04/19/16 0740 04/19/16 1120  GLUCAP 361* 337* 330* 362* 366*     Scheduled Meds: . allopurinol  300 mg Oral Daily  . amLODipine  5 mg Oral QHS  . aspirin  81 mg Oral Daily  . calcium carbonate  1,000 mg of elemental calcium Oral TID WC  . furosemide  80 mg Oral Daily  . gabapentin  600 mg Oral BID  . insulin aspart  0-20 Units Subcutaneous TID WC & HS  . insulin aspart  100 Units Subcutaneous BID WC  . insulin aspart  100 Units Subcutaneous Once  . insulin detemir  100 Units Subcutaneous QHS  . losartan  100 mg Oral QHS  . pravastatin  40 mg Oral q1800  . senna-docusate  2 tablet Oral QHS    Assessment/Plan:  1. Diabetes mellitus. Patient on large doses of insulin. Patient  can go back on his usual schedule of medications at home. Follows up with his endocrinologist Dr. Loanne Drilling as outpatient. Last hemoglobin A1c 7.0. Patient seen by pharmacy and diabetic specialist. 2. Status post one parathyroid removal for hypercalcemia 3. Essential hypertension on Norvasc and losartan 4. Hyperlipidemia unspecified on pravastatin 5. History of gout on allopurinol 6. Morbid obesity and sleep apnea on CPAP  Code Status:     Code Status Orders        Start     Ordered   04/18/16 1818  Do not attempt resuscitation (DNR)  Continuous    Question Answer Comment  In the event of cardiac or respiratory ARREST Do not call a "code blue"   In the event of cardiac or respiratory ARREST Do not perform Intubation, CPR, defibrillation or  ACLS   In the event of cardiac or respiratory ARREST Use medication by any route, position, wound care, and other measures to relive pain and suffering. May use oxygen, suction and manual treatment of airway obstruction as needed for comfort.      04/18/16 1817    Code Status History    Date Active Date Inactive Code Status Order ID Comments User Context   04/18/2016 10:24 AM 04/18/2016  6:17 PM Full Code [id number]  [name], MD Inpatient    Advance Directive Documentation     Most Recent Value  Type of Advance Directive  Healthcare Power of Attorney  Pre-existing out of facility DNR order (yellow form or pink MOST form)  -  "MOST" Form in Place?  -     Family Communication: Permission to speak in front of family Disposition Plan: Patient will be discharged today  Time spent: 34 minutes  Loletha Grayer  [employer]

## 2016-04-19 NOTE — Progress Notes (Signed)
Inpatient Diabetes Program Recommendations  AACE/ADA: New Consensus Statement on Inpatient Glycemic Control (2015)  Target Ranges:  Prepandial:   less than 140 mg/dL      Peak postprandial:   less than 180 mg/dL (1-2 hours)      Critically ill patients:  140 - 180 mg/dL   Lab Results  Component Value Date   GLUCAP 362 (H) 04/19/2016   HGBA1C 7.0 02/28/2016    Review of Glycemic Control  Results for Donald [name], Donald [name] (MRN [id number]) as of 04/19/2016 09:05  Ref. Range 04/18/2016 23:31 04/19/2016 01:21 04/19/2016 02:30 04/19/2016 02:57 04/19/2016 07:40  Glucose-Capillary Latest Ref Range: 65 - 99 mg/dL 319 (H) 361 (H) 337 (H) 330 (H) 362 (H)    Diabetes history: Type 2 Outpatient Diabetes medications: Humalog 200units with breakfast, Humalog 150units with lunch, Humalog 200 units with supper, Humulin N  260units qhs- confirmed at the bedside with patient  Current orders for Inpatient glycemic control: Levemir 100units bid, Novolog 100units tid (d/c after breakfast), Novolog 25 units tid , Novolog 0-20 units tid/hs  (Discussed with RN Kenney Houseman)  Inpatient Diabetes Program Recommendations:  Spoke to patient.  He is seen by Dr. Loanne Drilling in [place] and has been for 15 years.  His A1C is ideal at 7%.  Patient denies any additional diabetes medications at home. Tells me he usually only eats 2 meals per day.  Discussed the importance of taking his medication as ordered, attending f/u MD visits, rotating injection sites and to give the large volume in divided locations to improve absorption.   Discussed concentrated insulins available with the patient and encouraged him to discuss other injectable medications as an option to complement the insulin he is taking now .  He complains about affordability of the insulin and medications and encouraged to reach out to the medication management clinic because they may be able to help him once he hits the donut hole.  Encouraged the patient to review his Medicare  plan- he is managed by [facility]- they may be able to offer support in the future.  Discussed the [facility] plan with patient's son Zhyon Antenucci (with the patient's permission).   Gentry Fitz, RN, BA, MHA, CDE Diabetes Coordinator Inpatient Diabetes Program  [phone] (Team Pager) [phone] ([place]) 04/19/2016 9:17 AM

## 2016-04-19 NOTE — Care Management Obs Status (Signed)
[facility] NOTIFICATION   Patient Details  Name: Donald [name] MRN: [id number] Date of Birth: 03-Feb-1945   Medicare Observation Status Notification Given:  No (admitted obs less than 24 hours)    Beverly Sessions, RN 04/19/2016, 11:03 AM

## 2016-04-19 NOTE — Progress Notes (Signed)
Pt d/c to home today. IV removed intact.  Rx's given to pt w/all questions and concerns addressed.  D/C paperwork reviewed and education provided with all questions and concerns addressed.  Pt wife at bedside for home transport.  

## 2016-04-19 NOTE — Telephone Encounter (Signed)
Patient is currently at [facility], I spoke with the pharmacist who wanted to clarify if the patient was taking the dosages on his med list. I advised on the patients chart, and looked through last OV note. The pharmacist stated that the patient was picking up the Novolin N at the pharmacy, but was not picking up the Humalog for some reason. She wanted the MD to be aware. Thank you!

## 2016-04-19 NOTE — Progress Notes (Addendum)
Prime doctor notified of CBG recheck was 330 after insulin 10 units IV was given at 0156 . Pt is asymptomatic, resting well in bed with no complaints. New orders placed by prime doctor and to keep checking pt.'s CBG before meals and at bedtime. Will continue to monitor pt closely.  Numa Heatwole [employer]

## 2016-04-19 NOTE — Final Progress Note (Signed)
.. 04/19/2016 8:08 AM  [name], Donald Miller [id number]  Post-Op Day 1    Temp:  [97.6 F (36.4 C)-98.8 F (37.1 C)] 97.8 F (36.6 C) (04/06 0529) Pulse Rate:  [55-68] 55 (04/06 0529) Resp:  [18-31] 18 (04/06 0529) BP: (110-243)/(29-161) 138/58 (04/06 0541) SpO2:  [91 %-100 %] 97 % (04/06 0529) Weight:  [130.9 kg (288 lb 8 oz)] 130.9 kg (288 lb 8 oz) (04/06 0528),     Intake/Output Summary (Last 24 hours) at 04/19/16 0808 Last data filed at 04/18/16 1352  Gross per 24 hour  Intake             1510 ml  Output                3 ml  Net             1507 ml    Results for orders placed or performed during the hospital encounter of 04/18/16 (from the past 24 hour(s))  Glucose, capillary     Status: Abnormal   Collection Time: 04/18/16  9:15 AM  Result Value Ref Range   Glucose-Capillary 137 (H) 65 - 99 mg/dL  Calcium     Status: None   Collection Time: 04/18/16 11:14 AM  Result Value Ref Range   Calcium 9.8 8.9 - 10.3 mg/dL  Glucose, capillary     Status: Abnormal   Collection Time: 04/18/16 11:27 AM  Result Value Ref Range   Glucose-Capillary 143 (H) 65 - 99 mg/dL  Glucose, capillary     Status: Abnormal   Collection Time: 04/18/16  4:47 PM  Result Value Ref Range   Glucose-Capillary 364 (H) 65 - 99 mg/dL  Glucose, capillary     Status: Abnormal   Collection Time: 04/18/16  8:17 PM  Result Value Ref Range   Glucose-Capillary 341 (H) 65 - 99 mg/dL  Glucose, capillary     Status: Abnormal   Collection Time: 04/18/16  9:04 PM  Result Value Ref Range   Glucose-Capillary 380 (H) 65 - 99 mg/dL  Glucose, capillary     Status: Abnormal   Collection Time: 04/18/16 11:31 PM  Result Value Ref Range   Glucose-Capillary 319 (H) 65 - 99 mg/dL  Glucose, capillary     Status: Abnormal   Collection Time: 04/19/16  1:21 AM  Result Value Ref Range   Glucose-Capillary 361 (H) 65 - 99 mg/dL  Glucose, capillary     Status: Abnormal   Collection Time: 04/19/16  2:30 AM  Result Value Ref  Range   Glucose-Capillary 337 (H) 65 - 99 mg/dL  Glucose, capillary     Status: Abnormal   Collection Time: 04/19/16  2:57 AM  Result Value Ref Range   Glucose-Capillary 330 (H) 65 - 99 mg/dL  Glucose, capillary     Status: Abnormal   Collection Time: 04/19/16  7:40 AM  Result Value Ref Range   Glucose-Capillary 362 (H) 65 - 99 mg/dL    SUBJECTIVE:  Pain controlled well.  Difficulty with glucose overnight of which patient reports is due to not being on home dose of insulin.  Tolerated CPAP well.  Post-operative calcium 9.8.  OBJECTIVE:  GEN-  Obese male in NAD NECK-  Incision c/d/I with minimal edema, no seroma or hematoma  IMPRESSION:  s/p parathyroidectomy POD#1  PLAN:  If patient is able to ambulate with minimal assist today, OK to discharge home from ENT perspective.  Appreciate Medicine help with complex glucose/BP control in immediate post-operative time.  Follow up  with me in 1 week.  Continue to check glucose at home but on home dose of insulin.  Dong Nimmons 04/19/2016, 8:08 AM

## 2016-04-19 NOTE — Progress Notes (Addendum)
Medication Related Note (insulin):  Called [facility] and confirmed the following prescriptions/doses:  1. Novolin N 280 units subQ qHS (last filled March 9th 2018) 2. Humalog (insulin lispro) 180 units subQ with breakfast, 150 units with lunch, and 240 units with supper. (last filled in December 2017)  MD on both prescriptions = Mena Goes, endocrinologist  Called prescribing physician's office to confirm doses. MD not in office today but spoke with RN who confirmed the following prescriptions were written at last appointment in February 2018:  1. Novolin N 280 units subQ qHS 2. Humalog 200 units subQ with breakfast, 180 units subQ with lunch, and 260 units subQ with supper.   Discussed with hospitalist. Verbal order for novolog 25 units TID with meals and levemir 100 units qHS entered.  Lenis Noon, PharmD Clinical Pharmacist 04/19/16 8:52 AM   Addendum: Discussed with RN. Patient reports that he has not picked up his humalog from the pharmacy recently because he was enrolled in an assistance program and received a 6 month supply through that program.  Discussed with MD and RN - patient to be discharged today. Have re-entered order for novolog 100 units twice daily with meals. Patient has not yet eaten breakfast but has tray in room and will eat right after insulin is injected.   Lenis Noon, PharmD 04/19/16 9:44 AM

## 2016-04-19 NOTE — Progress Notes (Signed)
RN notified Prime Doctor of recheck of CBG being 361 after insulin regular 5 units IV was given at 0021. New orders placed by prime doctor, RN will recheck CBG at 0225 as ordered after insulin regular 10 units IV given . Pt is resting comfortably in bed.  Will continue to monitor pt closely.   Felcia Huebert [employer]

## 2016-04-26 DIAGNOSIS — N183 Chronic kidney disease, stage 3 (moderate): Secondary | ICD-10-CM | POA: Diagnosis not present

## 2016-04-26 DIAGNOSIS — E210 Primary hyperparathyroidism: Secondary | ICD-10-CM | POA: Diagnosis not present

## 2016-04-26 DIAGNOSIS — D509 Iron deficiency anemia, unspecified: Secondary | ICD-10-CM | POA: Diagnosis not present

## 2016-04-26 DIAGNOSIS — M109 Gout, unspecified: Secondary | ICD-10-CM | POA: Diagnosis not present

## 2016-05-01 ENCOUNTER — Telehealth: Payer: Self-pay | Admitting: Cardiovascular Disease

## 2016-05-01 DIAGNOSIS — E1122 Type 2 diabetes mellitus with diabetic chronic kidney disease: Secondary | ICD-10-CM | POA: Diagnosis not present

## 2016-05-01 DIAGNOSIS — Z6841 Body Mass Index (BMI) 40.0 and over, adult: Secondary | ICD-10-CM | POA: Diagnosis not present

## 2016-05-01 DIAGNOSIS — Z9989 Dependence on other enabling machines and devices: Secondary | ICD-10-CM | POA: Diagnosis not present

## 2016-05-01 DIAGNOSIS — D509 Iron deficiency anemia, unspecified: Secondary | ICD-10-CM | POA: Diagnosis not present

## 2016-05-01 DIAGNOSIS — D351 Benign neoplasm of parathyroid gland: Secondary | ICD-10-CM | POA: Diagnosis not present

## 2016-05-01 DIAGNOSIS — I129 Hypertensive chronic kidney disease with stage 1 through stage 4 chronic kidney disease, or unspecified chronic kidney disease: Secondary | ICD-10-CM | POA: Diagnosis not present

## 2016-05-01 DIAGNOSIS — N183 Chronic kidney disease, stage 3 (moderate): Secondary | ICD-10-CM | POA: Diagnosis not present

## 2016-05-01 DIAGNOSIS — G4733 Obstructive sleep apnea (adult) (pediatric): Secondary | ICD-10-CM | POA: Diagnosis not present

## 2016-05-01 DIAGNOSIS — N2581 Secondary hyperparathyroidism of renal origin: Secondary | ICD-10-CM | POA: Diagnosis not present

## 2016-05-01 DIAGNOSIS — N200 Calculus of kidney: Secondary | ICD-10-CM | POA: Diagnosis not present

## 2016-05-01 NOTE — Telephone Encounter (Signed)
Patient was told by ccka and Dr. Milas Hock office to call [facility] and make him aware of hR in 78's .  Please call to discuss.

## 2016-05-01 NOTE — Telephone Encounter (Signed)
Returned call to patient. He's concerned that his HR today at his kidney doctor was 20. On 03/12/16 at [place] with Dr Rockey Situ, patient's HR was 50 and on 03/08/16 with Dr Janese Banks, HR was 90. In looking in EPIC at past office visits with various physicians, his HR has been in the mid-50's to low 60's.  I let patient know that this has been where his HR usually is. His new symptom is dizziness when he changes positions from lying or sitting to standing. Denies chest pain, SOB, or headache or nausea. This has been going on since he had a parathyroidectomy on 04/18/16. He's been to his post-op visit with Dr Pryor Ochoa who advised him to call Dr Rockey Situ and today his kidney doctor advised him to call Dr Rockey Situ to be seen. Patient also said his blood sugar was elevated while in the hospital and he did not feel like they gave him adequate medication to control it. Since being home, his blood sugar is back to his baseline around the 120's. Advised him to contact doctor who manages his blood sugar if he feels he needs to. BP on 4/17 at home was 148/55, HR and today at the doctor it was 128/60, HR 58. Patient would like to see Dr Rockey Situ. Appt scheduled to see Dr Rockey Situ 05/02/16 at 3 pm. Patient verbalized understanding.

## 2016-05-02 ENCOUNTER — Other Ambulatory Visit: Payer: Self-pay | Admitting: Cardiovascular Disease

## 2016-05-02 ENCOUNTER — Ambulatory Visit (INDEPENDENT_AMBULATORY_CARE_PROVIDER_SITE_OTHER): Payer: Medicare Other | Admitting: Cardiovascular Disease

## 2016-05-02 VITALS — BP 120/60 | HR 61 | Ht 66.0 in | Wt 280.5 lb

## 2016-05-02 DIAGNOSIS — I25119 Atherosclerotic heart disease of native coronary artery with unspecified angina pectoris: Secondary | ICD-10-CM | POA: Diagnosis not present

## 2016-05-02 DIAGNOSIS — R001 Bradycardia, unspecified: Secondary | ICD-10-CM | POA: Diagnosis not present

## 2016-05-02 DIAGNOSIS — I10 Essential (primary) hypertension: Secondary | ICD-10-CM | POA: Diagnosis not present

## 2016-05-02 NOTE — Patient Instructions (Addendum)
Medication Instructions:   Consider holding or cutting the amlodipine  If leg swelling gets better after one months, Cut down on the lasix   Monitor your blood pressure If it runs high, call the office   Labwork:  No new labs needed  Testing/Procedures:  No further testing at this time   I recommend watching educational videos on topics of interest to you at:       [email or web address]  Enter code: [email or web address]    Follow-Up: It was a pleasure seeing you in the office today. Please call us if you have new issues that need to be addressed before your next appt.  [phone]  Your physician wants you to follow-up in: 12 months.  You will receive a reminder letter in the mail two months in advance. If you don't receive a letter, please call our office to schedule the follow-up appointment.  If you need a refill on your cardiac medications before your next appointment, please call your pharmacy.

## 2016-05-02 NOTE — Progress Notes (Signed)
Cardiology Office Note  Date:  05/02/2016   ID:  Donald [name], DOB September 15, 1945, MRN [id number]  PCP:  Donald Shin, MD   Chief Complaint  Patient presents with  . other    Early f/u per pt call-dizziness. Pt states the dizziness has been going on since surgery. Had stress test. Pt  Reviewed meds with pt verbally.    HPI:   Donald [name] is a 71 year old male with history of  Long-standing diabetes, HBa1C 7 hyperlipidemia,  coronary artery disease,  Prior cardiac catheterization 2012 with 40% coronary disease noted Diffuse three-vessel coronary disease on CT scan PAD,   chronic kidney disease, hypercalcemia,   OSA on CPAP,  Surgery of his parathyroid Who presents for follow-up of his coronary artery disease  Nonsmoker, cholesterol well controlled on statin  Abnormal EKG leading to stress testing on prior clinic visit This showed no significant ischemia  Had parathyroid, surgery Now with vertigo Following surgery Mild improvement with meclizine, though still symptomatic  unable to exercise secondary to chronic leg pain, knee discomfort.  Walks with a cane  chronic back pain secondary to disc issues.   poor sleep, chronic fatigue Compliant with his CPAP  EKG personally reviewed by myself on todays visit Shows normal sinus rhythm with rate 61 bpm T-wave abnormality V6, 1 and aVL   previous PET/CT imaging   mild to moderate calcified plaque in the carotid arteries right greater than left Mild to moderate plaque in the aortic arch Diffuse three-vessel coronary calcifications He does have mild to moderate plaquing in the branches off the mid to distal descending aorta  staghorn left renal calculus  Recent parathyroid spectscan  showed a focal abnormal sestamibi location suspicious for right inferior parathyroid adenoma. Nonspecific tracer localization within single normal-sized left axillary and right subpectoral lymph node.   elective parathyroidectomy on  02/29/2016.    PMH:   has a past medical history of ANEMIA-IRON DEFICIENCY (07/26/2006); Anxiety; ASTHMA (07/26/2006); Asthma; Back pain, chronic; Bell's palsy; Chronic kidney disease; COLONIC POLYPS, HX OF (07/26/2006); D V T (12/03/2007); DEPRESSION (03/14/2009); DIABETES MELLITUS, TYPE II (07/26/2006); DISC DISEASE, LUMBAR (10/05/2007); DYSLIPIDEMIA (04/13/2009); Edema; GERD (07/26/2006); Gout; Heart murmur; History of kidney stones; HYPERTENSION (07/26/2006); INSOMNIA (08/21/2007); Neuropathy; OBSTRUCTIVE SLEEP APNEA (12/03/2007); PERIPHERAL NEUROPATHY (07/26/2006); Pernicious anemia (11/20/2006); PULMONARY EMBOLISM (10/05/2007); Renal insufficiency; and Shortness of breath dyspnea.  PSH:    Past Surgical History:  Procedure Laterality Date  . APPENDECTOMY  1968  . [place], 06/14/2008   Dr. Trenton Gammon at [facility] (06/10)  . CARDIAC CATHETERIZATION    . CARPAL TUNNEL RELEASE Right   . CATARACT EXTRACTION W/PHACO Left 05/30/2014   Procedure: CATARACT EXTRACTION PHACO AND INTRAOCULAR LENS PLACEMENT (IOC);  Surgeon: Estill Cotta, MD;  Location: [facility] ORS;  Service: Ophthalmology;  Laterality: Left;  [name] 01:20 AP% 23.7 CDE 31.86  . EYE SURGERY Left    Catarct Extraction with IOL  . LITHOTRIPSY     X 2  . PARATHYROIDECTOMY Right 04/18/2016   Procedure: PARATHYROIDECTOMY;  Surgeon: [name], MD;  Location: [facility] ORS;  Service: ENT;  Laterality: Right;  . SHOULDER ARTHROSCOPY W/ ROTATOR CUFF REPAIR Right   . TONSILLECTOMY      Current Outpatient Prescriptions  Medication Sig Dispense Refill  . allopurinol (ZYLOPRIM) 300 MG tablet TAKE ONE TABLET BY MOUTH EVERY DAY 30 tablet 2  . ALPRAZolam (XANAX) 1 MG tablet TAKE ONE TABLET AT BEDTIME AS NEEDED FORSLEEP 30 tablet 0  . amLODipine (NORVASC) 5 MG tablet  TAKE ONE TABLET AT BEDTIME 30 tablet 11  . aspirin 81 MG chewable tablet Chew 81 mg by mouth daily.    . calcium carbonate (TUMS - DOSED IN MG ELEMENTAL CALCIUM) 500 MG chewable tablet  Chew 5 tablets (1,000 mg of elemental calcium total) by mouth 3 (three) times daily with meals. 100 tablet 0  . colchicine (COLCRYS) 0.6 MG tablet 1 tab every hour until symptoms are better, max of 6 per day.  Stop if diarrhea. (Patient taking differently: Take 0.6 mg by mouth every 4 (four) hours as needed. 1 tab every hour until symptoms are better, max of 6 per day.  Stop if diarrhea.) 30 tablet 4  . cyanocobalamin (,VITAMIN B-12,) 1000 MCG/ML injection Inject 1,000 mcg into the muscle See admin instructions. 1 injection every 3 months     . furosemide (LASIX) 80 MG tablet TAKE ONE TABLET BY MOUTH EVERY DAY 90 tablet 3  . gabapentin (NEURONTIN) 600 MG tablet Take 600 mg by mouth 2 (two) times daily.    . GE100 BLOOD GLUCOSE TEST test strip TEST 3-4 TIMES DAILY 120 each 11  . HYDROcodone-acetaminophen (NORCO) 10-325 MG tablet Take 1 tablet by mouth every 4 (four) hours as needed for moderate pain.    Marland Kitchen HYDROcodone-acetaminophen (NORCO) 10-325 MG tablet Take 1 tablet by mouth every 6 (six) hours as needed. 30 tablet 0  . insulin lispro (HUMALOG) 100 UNIT/ML injection 200 units with breakfast 180 units with lunch and 260 units with the supper 17 vial 11  . insulin NPH Human (NOVOLIN N) 100 UNIT/ML injection Inject 2.8 mLs (280 Units total) into the skin at bedtime. 90 mL 5  . losartan (COZAAR) 100 MG tablet TAKE ONE TABLET BY MOUTH AT BEDTIME 90 tablet 3  . lovastatin (MEVACOR) 40 MG tablet TAKE TWO TABLETS AT BEDTIME 60 tablet 11  . Naphazoline-Pheniramine (OPCON-A OP) Place 1 drop into both eyes 2 (two) times daily as needed.    . ondansetron (ZOFRAN) 4 MG tablet Take 1 tablet (4 mg total) by mouth every 4 (four) hours as needed for nausea. 20 tablet 0  . Sennosides-Docusate Sodium (STOOL SOFTENER & LAXATIVE PO) Take 2 tablets by mouth at bedtime.     No current facility-administered medications for this visit.      Allergies:   Codeine and Pioglitazone   Social History:  The patient  reports  that he has never smoked. He has never used smokeless tobacco. He reports that he does not drink alcohol or use drugs.   Family History:   family history includes Cancer in his mother and sister; Diabetes in his father and paternal grandfather; Heart disease in his father.    Review of Systems: Review of Systems  Constitutional: Positive for malaise/fatigue.  Respiratory: Negative.   Cardiovascular: Negative.   Gastrointestinal: Negative.   Musculoskeletal: Positive for joint pain.  Neurological: Positive for weakness.  Psychiatric/Behavioral: Negative.   All other systems reviewed and are negative.    PHYSICAL EXAM: VS:  BP 120/60 (BP Location: Left Arm, Patient Position: Sitting, Cuff Size: Normal)   Pulse 61   Ht 5\' 6"  (1.676 m)   Wt 280 lb 8 oz (127.2 kg)   BMI 45.27 kg/m  , BMI Body mass index is 45.27 kg/m.  GEN: Well nourished, well developed, in no acute distress , obese HEENT: normal  Neck: no JVD, carotid bruits, or masses Cardiac: RRR; 1+ SEM RSB,  No rubs, or gallops,no edema  Respiratory:  clear to auscultation  bilaterally, normal work of breathing GI: soft, nontender, nondistended, + BS MS: no deformity or atrophy  Skin: warm and dry, no rash Neuro:  Strength and sensation are intact Psych: euthymic mood, full affect    Recent Labs: 02/15/2016: Hemoglobin 13.8; Platelets 171 04/15/2016: BUN 28; Creatinine, Ser 1.44; Potassium 4.2; Sodium 142    Lipid Panel Lab Results  Component Value Date   CHOL 103 01/27/2014   HDL 23.90 (L) 01/27/2014   LDLCALC 56 01/27/2014   TRIG 115.0 01/27/2014      Wt Readings from Last 3 Encounters:  05/02/16 280 lb 8 oz (127.2 kg)  04/19/16 288 lb 8 oz (130.9 kg)  03/12/16 281 lb 8 oz (127.7 kg)       ASSESSMENT AND PLAN:   Essential hypertension -  Low blood pressure on today's visit, asymptomatic  Unrelated to his recent vertigo symptoms Heart rate adequate high 50s, low 60s Recommended he consider holding  his amlodipine. This is likely causing lower extremity edema for which she has been taking high dose Lasix  Dyslipidemia -  Cholesterol is at goal on the current lipid regimen. No changes to the medications were made.  ECG abnormal -  Nonspecific T wave abnormality Recent negative stress test  Coronary artery disease involving native coronary artery of native heart with angina pectoris (HCC) Significant three-vessel coronary calcification seen on recent CT scan stress Myoview with no significant ischemia Prior cardiac catheterization in 2012 with known coronary disease mild in the left main, 40% in other vessels  Aortic atherosclerosis (HCC) Seen in the aortic arch, also branches of the descending aorta Stressed importance of aggressive diabetes control  Morbid obesity ([place]) Unable to exercise secondary to orthopedic issues, recommended low carbohydrate diet.  Type 2 diabetes mellitus without complication, without long-term current use of insulin (HCC) Hemoglobin A1c of 7 per the patient   Lower extremity edema Likely venous insufficiency exacerbated by amlodipine and obesity Recommended he hold the amlodipine If edema improves would wean down on the Lasix in half   Total encounter time more than 25 minutes  Greater than 50% was spent in counseling and coordination of care with the patient   Disposition:   F/U  12 months   Orders Placed This Encounter  Procedures  . EKG 12-Lead     Signed, Esmond Plants, M.D., Ph.D. 05/02/2016  [place], [place]

## 2016-05-08 ENCOUNTER — Other Ambulatory Visit: Payer: Self-pay | Admitting: Pharmacist

## 2016-05-08 NOTE — Patient Outreach (Addendum)
[facility]) Care Management  05/08/2016  Donald [name] [birth year] [id number]  71 y.o. year old male referred to [place] for Medication Management (Pharmacy Telephone Outreach) Per consult patient needs assistance with medication education and cost.  Was unable to reach patient via telephone today and have left HIPAA compliant voicemail asking him to return my call (unsuccessful outreach #1).  Plan: Followup in 1 week via telephone  Bennye Alm, PharmD, [place] PGY2 Pharmacy Resident [phone]

## 2016-05-10 ENCOUNTER — Telehealth: Payer: Self-pay | Admitting: Pulmonary Disease

## 2016-05-10 DIAGNOSIS — G4733 Obstructive sleep apnea (adult) (pediatric): Secondary | ICD-10-CM

## 2016-05-10 NOTE — Telephone Encounter (Signed)
Order placed for replacement cpap.  Pt aware of order placed.  Nothing further needed.

## 2016-05-10 NOTE — Telephone Encounter (Signed)
Called and spoke with pt and he stated that his cpap machine is locked up.  He stated that this cpap machine he has had for many years and he is needing a new machine.  He stated that [facility] cannot get him a new machine without an rx for this.  MW please advise if we can send this in.  RA is not on the schedule today.   Thanks  Last seen by TP on 10/2015   Allergies  Allergen Reactions  . Codeine     REACTION: chest pain  . Pioglitazone     REACTION to Actos: swelling in ankles

## 2016-05-10 NOTE — Telephone Encounter (Signed)
Fine with me

## 2016-05-13 ENCOUNTER — Telehealth: Payer: Self-pay | Admitting: Pulmonary Disease

## 2016-05-13 DIAGNOSIS — R42 Dizziness and giddiness: Secondary | ICD-10-CM | POA: Diagnosis not present

## 2016-05-13 NOTE — Telephone Encounter (Signed)
Will call [facility] tomorrow

## 2016-05-14 NOTE — Telephone Encounter (Signed)
Called [facility] to see what they needed, per [facility], they need a more recent OV. Last OV was back in Oct 2017 and per Medicare's guidelines he needs an OV in 2018. Pt is aware and has been scheduled for 230p on 05/15/16. Nothing else is needed.

## 2016-05-15 ENCOUNTER — Ambulatory Visit: Payer: Medicare Other | Admitting: Pulmonary Disease

## 2016-05-15 ENCOUNTER — Ambulatory Visit: Payer: Self-pay | Admitting: Pharmacist

## 2016-05-17 ENCOUNTER — Other Ambulatory Visit: Payer: Self-pay | Admitting: Pharmacist

## 2016-05-17 NOTE — Patient Outreach (Signed)
[facility]) Care Management  [place]   05/17/2016  Donald [name] 03-31-1945 [id number]  Subjective: 71 y.o. year old male referred to [place] for Medication Management (Pharmacy Telephone Outreach) Per consult patient needs assistance with medication education and cost. Called patient today to discuss medication assistance.  Patient reports adherence to his medications.  He states he never misses his medications and uses a pill box to remember them.  He states he has spent ~$800 out of pocket this year on his Novolin N insulin and received patient assistance from [place] last year for his Humalog which he still has a supply of.  Patient was able to fill out the [place] application last year using his income and it was approved.    Diabetes Patient reports rare hypoglycemic events with CBGs <70 mg/dL.  He reports most before meal CBGs between 120-140s with his night time readings being slightly higher depending on the time he eats.  Gout Reports last episode last week.  It is the first episode he has had in several months.    Hypertension Patient stopped amlodipine per cardiology secondary to lower extremity swelling.  He states this has improved since discontinuation.   Objective:  Lab Results  Component Value Date   HGBA1C 7.0 02/28/2016   Lipid Panel     Component Value Date/Time   CHOL 103 01/27/2014 1149   TRIG 115.0 01/27/2014 1149   HDL 23.90 (L) 01/27/2014 1149   CHOLHDL 4 01/27/2014 1149   VLDL 23.0 01/27/2014 1149   LDLCALC 56 01/27/2014 1149   LDLDIRECT 52.2 05/25/2009 0937   Encounter Medications: Outpatient Encounter Prescriptions as of 05/17/2016  Medication Sig Note  . allopurinol (ZYLOPRIM) 300 MG tablet TAKE ONE TABLET BY MOUTH EVERY DAY   . ALPRAZolam (XANAX) 1 MG tablet TAKE ONE TABLET AT BEDTIME AS NEEDED FORSLEEP   . aspirin 81 MG chewable tablet Chew 81 mg by mouth daily.   . calcium carbonate (TUMS - DOSED IN MG ELEMENTAL  CALCIUM) 500 MG chewable tablet Chew 5 tablets (1,000 mg of elemental calcium total) by mouth 3 (three) times daily with meals. (Patient taking differently: Chew 1,000 mg of elemental calcium by mouth 2 (two) times daily with a meal. )   . colchicine (COLCRYS) 0.6 MG tablet 1 tab every hour until symptoms are better, max of 6 per day.  Stop if diarrhea. (Patient taking differently: Take 0.6 mg by mouth every 4 (four) hours as needed. 1 tab every hour until symptoms are better, max of 6 per day.  Stop if diarrhea.) 04/09/2016: As needed  . cyanocobalamin (,VITAMIN B-12,) 1000 MCG/ML injection Inject 1,000 mcg into the muscle See admin instructions. 1 injection every 3 months    . furosemide (LASIX) 80 MG tablet TAKE ONE TABLET BY MOUTH EVERY DAY   . gabapentin (NEURONTIN) 600 MG tablet Take 600 mg by mouth 2 (two) times daily.   . GE100 BLOOD GLUCOSE TEST test strip TEST 3-4 TIMES DAILY   . HYDROcodone-acetaminophen (NORCO) 10-325 MG tablet Take 1 tablet by mouth every 6 (six) hours as needed.   . insulin lispro (HUMALOG) 100 UNIT/ML injection 200 units with breakfast 180 units with lunch and 260 units with the supper (Patient taking differently: 200 units with breakfast 180 units with lunch and 200-220 units with the supper)   . insulin NPH Human (NOVOLIN N) 100 UNIT/ML injection Inject 2.8 mLs (280 Units total) into the skin at bedtime.   Marland Kitchen losartan (COZAAR)  100 MG tablet TAKE ONE TABLET BY MOUTH AT BEDTIME   . lovastatin (MEVACOR) 40 MG tablet TAKE TWO TABLETS AT BEDTIME   . Naphazoline-Pheniramine (OPCON-A OP) Place 1 drop into both eyes 2 (two) times daily as needed.   [name] Sodium (STOOL SOFTENER & LAXATIVE PO) Take 2 tablets by mouth at bedtime.   . ondansetron (ZOFRAN) 4 MG tablet Take 1 tablet (4 mg total) by mouth every 4 (four) hours as needed for nausea. (Patient not taking: Reported on 05/17/2016)   . [DISCONTINUED] amLODipine (NORVASC) 5 MG tablet TAKE ONE TABLET AT BEDTIME  (Patient not taking: Reported on 05/17/2016)   . [DISCONTINUED] HYDROcodone-acetaminophen (NORCO) 10-325 MG tablet Take 1 tablet by mouth every 4 (four) hours as needed for moderate pain.    No facility-administered encounter medications on file as of 05/17/2016.     Functional Status: In your present state of health, do you have any difficulty performing the following activities: 04/18/2016 04/18/2016  Hearing? - N  Vision? - N  Difficulty concentrating or making decisions? - N  Walking or climbing stairs? - N  Dressing or bathing? - N  Doing errands, shopping? N -  Some recent data might be hidden    Fall/Depression Screening: Fall Risk  05/20/2014  Falls in the past year? No   PHQ 2/9 Scores 05/20/2014  PHQ - 2 Score 0   Assessment: Drugs sorted by system: Neurologic/Psychologic: alprazolam, hydrocodone/acetaminophen  Cardiovascular: aspirin, furosemide, losartan, lovastatin,  Gastrointestinal: ondansetron as needed (not taking), sennosides/docusate  Endocrine: calcium carbonate, Humalog 200 units before breakfast, 180 units before lunch and 200-220 units before supper, Novolin N 280 units at bedtime  Pain: gabapentin   Vitamins/Minerals: vitamin B12   Miscellaneous: allopurinol, colchicine as needed, Opcon eye drops  Medications to avoid in the elderly: alprazolam and hydrocodone due to risk of respiratory depression and falls.  Other issues noted:  Medication Assistance: [employer] medicare patient who currently has some difficulty affording his medications.  He has currently spent $800 out of pocket and will be eligible for [employer] Patient assistance once he reaches $1100 out of pocket.  Patient has previously filled out medication assistance paperwork using only his income and has been approved.  Patient and wife income above low income subsidy income limit.    Plan: -Will mail patient [employer] Patient Assistance application to be completed once he reaches $1100 out of pocket.   Instructed him that Dr Loanne Drilling would need to switch the Novolin N back to Humulin N so both medications can be completed with [employer]. -Provided patient with [place] telephone number and instructed to call if he is not approved by [employer] or if he has any issues with his medications -[facility] pharmacy will not open case at this time as patient reports adherence to his medications and has plan for medication assistance.   Bennye Alm, PharmD, [place] PGY2 Pharmacy Resident [phone]

## 2016-05-18 DIAGNOSIS — M25511 Pain in right shoulder: Secondary | ICD-10-CM | POA: Diagnosis not present

## 2016-05-21 ENCOUNTER — Other Ambulatory Visit: Payer: Self-pay | Admitting: Endocrinology

## 2016-05-29 DIAGNOSIS — M25511 Pain in right shoulder: Secondary | ICD-10-CM | POA: Diagnosis not present

## 2016-06-05 DIAGNOSIS — M75121 Complete rotator cuff tear or rupture of right shoulder, not specified as traumatic: Secondary | ICD-10-CM | POA: Diagnosis not present

## 2016-06-05 DIAGNOSIS — M25511 Pain in right shoulder: Secondary | ICD-10-CM | POA: Diagnosis not present

## 2016-06-05 DIAGNOSIS — M7541 Impingement syndrome of right shoulder: Secondary | ICD-10-CM | POA: Diagnosis not present

## 2016-06-13 DIAGNOSIS — N2581 Secondary hyperparathyroidism of renal origin: Secondary | ICD-10-CM | POA: Diagnosis not present

## 2016-06-13 DIAGNOSIS — N183 Chronic kidney disease, stage 3 (moderate): Secondary | ICD-10-CM | POA: Diagnosis not present

## 2016-07-08 ENCOUNTER — Ambulatory Visit: Payer: Medicare Other | Admitting: Endocrinology

## 2016-07-08 ENCOUNTER — Telehealth: Payer: Self-pay | Admitting: Endocrinology

## 2016-07-08 NOTE — Telephone Encounter (Signed)
This patient was not scheduled to see Dr. Loanne Drilling until 2 pm? The appointment notes state the patient cancelled his appointment due to lack of transportation.

## 2016-07-08 NOTE — Telephone Encounter (Signed)
Same day cancellation. NS removed. Pt rescheduled

## 2016-07-08 NOTE — Telephone Encounter (Signed)
Patient no showed today's appt. Please advise on how to follow up. °A. No follow up necessary. °B. Follow up urgent. Contact patient immediately. °C. Follow up necessary. Contact patient and schedule visit in ___ days. °D. Follow up advised. Contact patient and schedule visit in ____weeks. ° °

## 2016-07-09 ENCOUNTER — Other Ambulatory Visit: Payer: Self-pay | Admitting: Endocrinology

## 2016-07-09 DIAGNOSIS — H66003 Acute suppurative otitis media without spontaneous rupture of ear drum, bilateral: Secondary | ICD-10-CM | POA: Diagnosis not present

## 2016-07-09 DIAGNOSIS — H60331 Swimmer's ear, right ear: Secondary | ICD-10-CM | POA: Diagnosis not present

## 2016-07-09 DIAGNOSIS — J0101 Acute recurrent maxillary sinusitis: Secondary | ICD-10-CM | POA: Diagnosis not present

## 2016-07-09 NOTE — Telephone Encounter (Signed)
Okay to refill? 

## 2016-07-09 NOTE — Telephone Encounter (Signed)
Noted. Will fax.

## 2016-07-09 NOTE — Telephone Encounter (Signed)
I printed  

## 2016-07-27 ENCOUNTER — Other Ambulatory Visit: Payer: Self-pay | Admitting: Endocrinology

## 2016-08-05 ENCOUNTER — Ambulatory Visit: Payer: Medicare Other | Admitting: Endocrinology

## 2016-08-05 ENCOUNTER — Other Ambulatory Visit: Payer: Self-pay | Admitting: Endocrinology

## 2016-08-20 ENCOUNTER — Telehealth: Payer: Self-pay | Admitting: Pulmonary Disease

## 2016-08-20 ENCOUNTER — Other Ambulatory Visit: Payer: Self-pay | Admitting: Endocrinology

## 2016-08-20 NOTE — Telephone Encounter (Signed)
Pt c/o worsening cough X1 week, sob, chest tightness with cough.  Requesting OV.  Pt last seen in 2016.  Scheduled to see MW tomorrow at 78.  Nothing further needed at this time.

## 2016-08-21 ENCOUNTER — Ambulatory Visit (INDEPENDENT_AMBULATORY_CARE_PROVIDER_SITE_OTHER): Payer: Medicare Other | Admitting: Internal Medicine

## 2016-08-21 ENCOUNTER — Encounter: Payer: Self-pay | Admitting: Internal Medicine

## 2016-08-21 VITALS — BP 122/60 | HR 56 | Temp 98.2°F | Ht 66.0 in | Wt 279.0 lb

## 2016-08-21 DIAGNOSIS — R05 Cough: Secondary | ICD-10-CM

## 2016-08-21 DIAGNOSIS — I25119 Atherosclerotic heart disease of native coronary artery with unspecified angina pectoris: Secondary | ICD-10-CM | POA: Diagnosis not present

## 2016-08-21 DIAGNOSIS — R058 Other specified cough: Secondary | ICD-10-CM | POA: Insufficient documentation

## 2016-08-21 MED ORDER — METHYLPREDNISOLONE ACETATE 80 MG/ML IJ SUSP
120.0000 mg | Freq: Once | INTRAMUSCULAR | Status: AC
  Administered 2016-08-21: 120 mg via INTRAMUSCULAR

## 2016-08-21 MED ORDER — HYDROCODONE-ACETAMINOPHEN 10-325 MG PO TABS: 1.0000 | ORAL_TABLET | ORAL | 0 refills | Status: DC | PRN

## 2016-08-21 MED ORDER — AZITHROMYCIN 250 MG PO TABS: ORAL_TABLET | ORAL | 0 refills | Status: DC

## 2016-08-21 NOTE — Progress Notes (Signed)
Subjective:    Patient ID: Donald [name], male    DOB: [birth year], 71 y.o.   MRN: [id number]  70 yowm never smoker / MO  obstructive sleep apnea & asthmatic bronchitis 1-2 episodes /year.  He developed DVT/pulmonary embolism 09/2007 -seemingly idiopathic,subseq underwent back surgery 06/2008 by Dr Trenton Gammon- Off coumadin since 10/2008  He also had a chronic cough, which was attributed to GERD.      11/14/2015 Acute OV / NP  Patient presents for an acute office visit. He complains of 2 days of cough, congestion ,sinus congestion with thick green mucus. Has sinus pain and pressure. No fever or wheezing . Taking tylenol .  Was seen 2 months prior to OV   with URI/sinusitis seen by ENT rx amoxicilin and steroids.  rec Zpack take as directed.  Mucinex DM Twice daily  As needed  Cough /congestion  May try Allegra 180mg  daily As needed for drainage.  Saline nasal rinses As needed  Twice daily   Begin Flonase 2 puffs daily As needed  For nasal congestion .    08/21/2016 acute extended ov/Etrulia Zarr re: recurrent cough not on gerd rx  Chief Complaint  Patient presents with  . Acute Visit    Cough x 2 wks- worse at night and when he drinks something cold.  He states cough is non prod and he has also noticed some wheezing.   acute onset with nasal congestion and dry cough /wheeze sensation worse at hs  X 2 weeks prior to OV   Already tried mucinex dm but none of the other measures above as rec  Takes norco maybe once a day 8-9 pm on avg but cough worse when lies down assoc with sore throat but no overt hb symtpoms/ no inhlalers   No obvious day to day or daytime variability or assoc excess/ purulent sputum or mucus plugs or hemoptysis or cp or chest tightness,    overt sinus or hb symptoms. No unusual exp hx or h/o childhood pna/ asthma or knowledge of premature birth.   Also denies any obvious fluctuation of symptoms with weather or environmental changes or other aggravating or alleviating  factors except as outlined above   Current Medications, Allergies, Complete Past Medical History, Past Surgical History, Family History, and Social History were reviewed in [employer] record.  ROS  The following are not active complaints unless bolded sore throat, dysphagia, dental problems, itching, sneezing,  nasal congestion or excess/ purulent secretions, ear ache,   fever, chills, sweats, unintended wt loss, classically pleuritic or exertional cp,  orthopnea pnd or leg swelling, presyncope, palpitations, abdominal pain, anorexia, nausea, vomiting, diarrhea  or change in bowel or bladder habits, change in stools or urine, dysuria,hematuria,  rash, arthralgias, visual complaints, headache, numbness, weakness or ataxia or problems with walking or coordination,  change in mood/affect or memory.              Objective:   Physical Exam     Very hoarse amb obese wm nad   Wt Readings from Last 3 Encounters:  08/21/16 279 lb (126.6 kg)  05/02/16 280 lb 8 oz (127.2 kg)  04/19/16 288 lb 8 oz (130.9 kg)    Vital signs reviewed - Note on arrival 02 sats  98% on RA    HEENT: nl dentition, turbinates bilaterally, and oropharynx. Nl external ear canals without cough reflex   NECK :  without JVD/Nodes/TM/ nl carotid upstrokes bilaterally   LUNGS: no acc muscle use,  Nl contour chest which is clear to A and P bilaterally without cough on insp or exp maneuvers   CV:  RRR  no s3 or murmur or increase in P2, and no edema   ABD:  Massively obese soft and nontender with limited inspiratory excursion in the supine position. No bruits or organomegaly appreciated, bowel sounds nl  MS:  Nl gait/ ext warm without deformities, calf tenderness, cyanosis or clubbing No obvious joint restrictions   SKIN: warm and dry without lesions    NEURO:  alert, approp, nl sensorium with  no motor or cerebellar deficits apparent.        Assessment & Plan:

## 2016-08-21 NOTE — Assessment & Plan Note (Addendum)
Recurrent pattern ? Uri vs non-specific rhinitis   Of the three most common causes of  Sub-acute or recurrent or chronic cough, only one (GERD)  can actually contribute to/ trigger  the other two (asthma and post nasal drip syndrome)  and perpetuate the cylce of cough.  While not intuitively obvious, many patients with chronic low grade reflux do not cough until there is a primary insult that disturbs the protective epithelial barrier and exposes sensitive nerve endings.   This is typically viral as well be the case here but can be direct physical injury such as with an endotracheal tube.   The point is that once this occurs, it is difficult to eliminate the cycle  using anything but a maximally effective acid suppression regimen at least in the short run, accompanied by an appropriate diet to address non acid GERD and control / eliminate the cough itself for at least 3 days.    rec max rx for gerd/ zpak/ suppress cough with hydrocodone that he uses chronically anyway if very low doses, and f/u if not better in a week  I had an extended discussion with the patient reviewing all relevant studies completed to date and  lasting 25 minutes of a 40  minute acute office visit with pt new to me re  severe non-specific but potentially very serious refractory respiratory symptoms of uncertain and potentially multiple  etiologies.   Each maintenance medication was reviewed in detail including most importantly the difference between maintenance and prns and under what circumstances the prns are to be triggered using an action plan format that is not reflected in the computer generated alphabetically organized AVS.    Please see AVS for specific instructions unique to this office visit that I personally wrote and verbalized to the the pt in detail and then reviewed with pt  by my nurse highlighting any changes in therapy/plan of care  recommended at today's visit.

## 2016-08-21 NOTE — Patient Instructions (Addendum)
depomedrol 120 mg IM  zpak  mucinex dm 1200 mg every 12 hours and ok to use norco up to 2 every 4 hours if you must x only 3 days then try off     If not improving add Try prilosec otc 20mg   Take 30-60 min before first meal of the day and Pepcid ac (famotidine) 20 mg one @  bedtime until cough is completely gone for at least a week without the need for cough suppression     GERD (REFLUX)  is an extremely common cause of respiratory symptoms just like yours , many times with no obvious heartburn at all.    It can be treated with medication, but also with lifestyle changes including elevation of the head of your bed (ideally with 6 inch  bed blocks),  Smoking cessation, avoidance of late meals, excessive alcohol, and avoid fatty foods, chocolate, peppermint, colas, red wine, and acidic juices such as orange juice.  NO MINT OR MENTHOL PRODUCTS SO NO COUGH DROPS  USE SUGARLESS CANDY INSTEAD (Jolley ranchers or Stover's or Life Savers) or even ice chips will also do - the key is to swallow to prevent all throat clearing. NO OIL BASED VITAMINS - use powdered substitutes.

## 2016-08-21 NOTE — Assessment & Plan Note (Signed)
Body mass index is 45.03 kg/m.  -  trending down only slightly/ encouraged Lab Results  Component Value Date   TSH 3.11 03/02/2015     Contributing to gerd risk/ doe/reviewed the need and the process to achieve and maintain neg calorie balance > defer f/u primary care including intermittently monitoring thyroid status

## 2016-08-26 ENCOUNTER — Other Ambulatory Visit: Payer: Self-pay | Admitting: Endocrinology

## 2016-08-28 DIAGNOSIS — D509 Iron deficiency anemia, unspecified: Secondary | ICD-10-CM | POA: Diagnosis not present

## 2016-08-28 DIAGNOSIS — M109 Gout, unspecified: Secondary | ICD-10-CM | POA: Diagnosis not present

## 2016-08-28 DIAGNOSIS — N183 Chronic kidney disease, stage 3 (moderate): Secondary | ICD-10-CM | POA: Diagnosis not present

## 2016-09-02 ENCOUNTER — Encounter: Payer: Self-pay | Admitting: Endocrinology

## 2016-09-02 ENCOUNTER — Ambulatory Visit (INDEPENDENT_AMBULATORY_CARE_PROVIDER_SITE_OTHER): Payer: Medicare Other | Admitting: Endocrinology

## 2016-09-02 VITALS — BP 140/88 | HR 55 | Wt 280.6 lb

## 2016-09-02 DIAGNOSIS — I25119 Atherosclerotic heart disease of native coronary artery with unspecified angina pectoris: Secondary | ICD-10-CM | POA: Diagnosis not present

## 2016-09-02 DIAGNOSIS — E119 Type 2 diabetes mellitus without complications: Secondary | ICD-10-CM

## 2016-09-02 DIAGNOSIS — E538 Deficiency of other specified B group vitamins: Secondary | ICD-10-CM

## 2016-09-02 LAB — POCT GLYCOSYLATED HEMOGLOBIN (HGB A1C): Hemoglobin A1C: 8

## 2016-09-02 MED ORDER — CYANOCOBALAMIN 1000 MCG/ML IJ SOLN
1000.0000 ug | Freq: Once | INTRAMUSCULAR | Status: AC
  Administered 2016-09-02: 1000 ug via INTRAMUSCULAR

## 2016-09-02 MED ORDER — INSULIN NPH (HUMAN) (ISOPHANE) 100 UNIT/ML ~~LOC~~ SUSP: 260.0000 [IU] | Freq: Every day | SUBCUTANEOUS | 5 refills | Status: DC

## 2016-09-02 MED ORDER — INSULIN ASPART 100 UNIT/ML ~~LOC~~ SOLN: SUBCUTANEOUS | 3 refills | Status: DC

## 2016-09-02 MED ORDER — SILDENAFIL CITRATE 100 MG PO TABS: 100.0000 mg | ORAL_TABLET | Freq: Every day | ORAL | 3 refills | Status: DC | PRN

## 2016-09-02 NOTE — Progress Notes (Signed)
Subjective:    Patient ID: Donald [name], male    DOB: [birth year], 71 y.o.   MRN: [id number]  HPI Pt returns for f/u of diabetes mellitus: DM type: Insulin-requiring type 2 Dx'ed: 3570 Complications: polyneuropathy, CAD and renal insufficiency Therapy: insulin since 1995 DKA: never.  Severe hypoglycemia: never.  Pancreatitis: never.  Other: he takes multiple daily injections; he declines weight-loss surgery; he has severe insulin resistance; he declines pump   Interval history: no cbg record, but states cbg's vary from 96-200's.  It is in general higher as the day goes on.  He had a steroid shot 2 weeks ago, for acute bronchitis.   Past Medical History:  Diagnosis Date  . ANEMIA-IRON DEFICIENCY 07/26/2006  . Anxiety   . ASTHMA 07/26/2006  . Asthma   . Back pain, chronic   . Bell's palsy   . Chronic kidney disease    stage 3 renal failure  . COLONIC POLYPS, HX OF 07/26/2006  . D V T 12/03/2007  . DEPRESSION 03/14/2009  . DIABETES MELLITUS, TYPE II 07/26/2006  . [place] DISEASE, LUMBAR 10/05/2007  . DYSLIPIDEMIA 04/13/2009  . Edema    ankle  . GERD 07/26/2006  . Gout   . Heart murmur   . History of kidney stones   . HYPERTENSION 07/26/2006  . INSOMNIA 08/21/2007  . Neuropathy   . OBSTRUCTIVE SLEEP APNEA 12/03/2007   Use C-PAP  . PERIPHERAL NEUROPATHY 07/26/2006  . Pernicious anemia 11/20/2006  . PULMONARY EMBOLISM 10/05/2007  . Renal insufficiency   . Shortness of breath dyspnea     Past Surgical History:  Procedure Laterality Date  . APPENDECTOMY  1968  . [place], 06/14/2008   Dr. Trenton Gammon at [facility] (06/10)  . CARDIAC CATHETERIZATION    . CARPAL TUNNEL RELEASE Right   . CATARACT EXTRACTION W/PHACO Left 05/30/2014   Procedure: CATARACT EXTRACTION PHACO AND INTRAOCULAR LENS PLACEMENT (IOC);  Surgeon: Estill Cotta, MD;  Location: [facility] ORS;  Service: Ophthalmology;  Laterality: Left;  [name] 01:20 AP% 23.7 CDE 31.86  . EYE SURGERY Left    Catarct  Extraction with IOL  . LITHOTRIPSY     X 2  . PARATHYROIDECTOMY Right 04/18/2016   Procedure: PARATHYROIDECTOMY;  Surgeon: [name], MD;  Location: [facility] ORS;  Service: ENT;  Laterality: Right;  . SHOULDER ARTHROSCOPY W/ ROTATOR CUFF REPAIR Right   . TONSILLECTOMY      Social History   Social History  . Marital status: Married    Spouse name: N/A  . Number of children: N/A  . Years of education: N/A   Occupational History  . Disabled Disability   Social History Main Topics  . Smoking status: Never Smoker  . Smokeless tobacco: Never Used  . Alcohol use No  . Drug use: No  . Sexual activity: Not on file   Other Topics Concern  . 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   Activity is severely limited by medical problems   Diet is "good".   Never a smoker   Alcohol: none          Current Outpatient Prescriptions on File Prior to Visit  Medication Sig Dispense Refill  . allopurinol (ZYLOPRIM) 300 MG tablet TAKE ONE TABLET EVERY DAY 30 tablet 2  . ALPRAZolam (XANAX) 1 MG tablet TAKE 1 TABLET BY MOUTH AT BEDTIME AS NEEDED FOR SLEEP 30 tablet 2  .  aspirin 81 MG chewable tablet Chew 81 mg by mouth daily.    . calcium carbonate (TUMS - DOSED IN MG ELEMENTAL CALCIUM) 500 MG chewable tablet Chew 5 tablets (1,000 mg of elemental calcium total) by mouth 3 (three) times daily with meals. (Patient taking differently: Chew 1,000 mg of elemental calcium by mouth 2 (two) times daily with a meal. ) 100 tablet 0  . colchicine (COLCRYS) 0.6 MG tablet 1 tab every hour until symptoms are better, max of 6 per day.  Stop if diarrhea. (Patient taking differently: Take 0.6 mg by mouth every 4 (four) hours as needed. 1 tab every hour until symptoms are better, max of 6 per day.  Stop if diarrhea.) 30 tablet 4  . cyanocobalamin (,VITAMIN B-12,) 1000 MCG/ML injection Inject 1,000 mcg into the muscle See admin  instructions. 1 injection every 3 months     . furosemide (LASIX) 80 MG tablet TAKE ONE TABLET BY MOUTH EVERY DAY 90 tablet 2  . gabapentin (NEURONTIN) 600 MG tablet Take 600 mg by mouth 2 (two) times daily.    . GE100 BLOOD GLUCOSE TEST test strip TEST 3-4 TIMES DAILY 120 each 11  . HYDROcodone-acetaminophen (NORCO) 10-325 MG tablet Take 1 tablet by mouth every 4 (four) hours as needed. 30 tablet 0  . losartan (COZAAR) 100 MG tablet TAKE ONE TABLET BY MOUTH AT BEDTIME 90 tablet 3  . lovastatin (MEVACOR) 40 MG tablet TAKE TWO TABLETS AT BEDTIME 60 tablet 11  . Naphazoline-Pheniramine (OPCON-A OP) Place 1 drop into both eyes 2 (two) times daily as needed.    . ondansetron (ZOFRAN) 4 MG tablet Take 1 tablet (4 mg total) by mouth every 4 (four) hours as needed for nausea. 20 tablet 0  . Sennosides-Docusate Sodium (STOOL SOFTENER & LAXATIVE PO) Take 2 tablets by mouth at bedtime.     No current facility-administered medications on file prior to visit.     Allergies  Allergen Reactions  . Amlodipine Other (See Comments)    Leg swelling   . Codeine Other (See Comments)    REACTION: chest pain  . Pioglitazone Other (See Comments)    REACTION to Actos: swelling in ankles    Family History  Problem Relation Age of Onset  . Cancer Mother        Breast Cancer  . Cancer Sister        Breast Cancer  . Diabetes Father   . Heart disease Father   . Diabetes Paternal Grandfather     BP 140/88   Pulse (!) 55   Wt 280 lb 9.6 oz (127.3 kg)   SpO2 95%   BMI 45.29 kg/m    Review of Systems He denies hypoglycemia.      Objective:   Physical Exam VITAL SIGNS:  See vs page.  GENERAL: no distress.  Pulses: foot pulses are intact bilaterally.   MSK: no deformity of the feet or ankles.  CV: 1+ bilat edema of the legs.  Skin:  no ulcer on the feet or ankles.  normal color and temp on the feet and ankles.   Neuro: sensation is intact to touch on the feet and ankles,  but decreased from  normal.   A1c=8.0%    Assessment & Plan:  Insulin-requiring type 2 DM, with CAD: worse.  He again declines pump ED: persistent  Patient Instructions  Please change the insulin to the numbers listed below.   I have sent a prescription to your pharmacy, for viagra.  check your blood sugar 4 times a day; before the 3 meals, and at bedtime.  also check if you have symptoms of your blood sugar being too high or too low.  please keep a record of the readings and bring it to your next appointment here (or you can bring the meter itself).  You can write it on any piece of paper.  please call us sooner if your blood sugar goes below 70, or if you have a lot of readings over 200.   Please come back for a follow-up appointment in 4 months.

## 2016-09-02 NOTE — Patient Instructions (Addendum)
Please change the insulin to the numbers listed below.   I have sent a prescription to your pharmacy, for viagra.   check your blood sugar 4 times a day; before the 3 meals, and at bedtime.  also check if you have symptoms of your blood sugar being too high or too low.  please keep a record of the readings and bring it to your next appointment here (or you can bring the meter itself).  You can write it on any piece of paper.  please call us sooner if your blood sugar goes below 70, or if you have a lot of readings over 200.   Please come back for a follow-up appointment in 4 months.

## 2016-09-03 ENCOUNTER — Other Ambulatory Visit: Payer: Self-pay | Admitting: Endocrinology

## 2016-09-25 ENCOUNTER — Other Ambulatory Visit: Payer: Self-pay | Admitting: *Deleted

## 2016-09-30 DIAGNOSIS — R05 Cough: Secondary | ICD-10-CM | POA: Diagnosis not present

## 2016-09-30 DIAGNOSIS — J0101 Acute recurrent maxillary sinusitis: Secondary | ICD-10-CM | POA: Diagnosis not present

## 2016-10-15 DIAGNOSIS — M25572 Pain in left ankle and joints of left foot: Secondary | ICD-10-CM | POA: Diagnosis not present

## 2016-10-21 DIAGNOSIS — Z23 Encounter for immunization: Secondary | ICD-10-CM | POA: Diagnosis not present

## 2016-10-31 ENCOUNTER — Other Ambulatory Visit: Payer: Self-pay | Admitting: Endocrinology

## 2016-11-06 DIAGNOSIS — I129 Hypertensive chronic kidney disease with stage 1 through stage 4 chronic kidney disease, or unspecified chronic kidney disease: Secondary | ICD-10-CM | POA: Diagnosis not present

## 2016-11-06 DIAGNOSIS — D509 Iron deficiency anemia, unspecified: Secondary | ICD-10-CM | POA: Diagnosis not present

## 2016-11-06 DIAGNOSIS — G4733 Obstructive sleep apnea (adult) (pediatric): Secondary | ICD-10-CM | POA: Diagnosis not present

## 2016-11-06 DIAGNOSIS — Z9989 Dependence on other enabling machines and devices: Secondary | ICD-10-CM | POA: Diagnosis not present

## 2016-11-06 DIAGNOSIS — Z6841 Body Mass Index (BMI) 40.0 and over, adult: Secondary | ICD-10-CM | POA: Diagnosis not present

## 2016-11-06 DIAGNOSIS — D351 Benign neoplasm of parathyroid gland: Secondary | ICD-10-CM | POA: Diagnosis not present

## 2016-11-06 DIAGNOSIS — M109 Gout, unspecified: Secondary | ICD-10-CM | POA: Diagnosis not present

## 2016-11-06 DIAGNOSIS — N200 Calculus of kidney: Secondary | ICD-10-CM | POA: Diagnosis not present

## 2016-11-06 DIAGNOSIS — E1122 Type 2 diabetes mellitus with diabetic chronic kidney disease: Secondary | ICD-10-CM | POA: Diagnosis not present

## 2016-11-06 DIAGNOSIS — N2581 Secondary hyperparathyroidism of renal origin: Secondary | ICD-10-CM | POA: Diagnosis not present

## 2016-11-06 DIAGNOSIS — N183 Chronic kidney disease, stage 3 (moderate): Secondary | ICD-10-CM | POA: Diagnosis not present

## 2016-11-12 DIAGNOSIS — B079 Viral wart, unspecified: Secondary | ICD-10-CM | POA: Diagnosis not present

## 2016-11-12 DIAGNOSIS — R202 Paresthesia of skin: Secondary | ICD-10-CM | POA: Diagnosis not present

## 2016-11-12 DIAGNOSIS — M5416 Radiculopathy, lumbar region: Secondary | ICD-10-CM | POA: Diagnosis not present

## 2016-11-12 DIAGNOSIS — D225 Melanocytic nevi of trunk: Secondary | ICD-10-CM | POA: Diagnosis not present

## 2016-11-12 DIAGNOSIS — D2261 Melanocytic nevi of right upper limb, including shoulder: Secondary | ICD-10-CM | POA: Diagnosis not present

## 2016-11-12 DIAGNOSIS — D485 Neoplasm of uncertain behavior of skin: Secondary | ICD-10-CM | POA: Diagnosis not present

## 2016-11-12 DIAGNOSIS — D2271 Melanocytic nevi of right lower limb, including hip: Secondary | ICD-10-CM | POA: Diagnosis not present

## 2016-11-12 DIAGNOSIS — D0439 Carcinoma in situ of skin of other parts of face: Secondary | ICD-10-CM | POA: Diagnosis not present

## 2016-11-27 ENCOUNTER — Other Ambulatory Visit: Payer: Self-pay | Admitting: Endocrinology

## 2016-11-28 DIAGNOSIS — M5416 Radiculopathy, lumbar region: Secondary | ICD-10-CM | POA: Diagnosis not present

## 2016-12-26 DIAGNOSIS — J0101 Acute recurrent maxillary sinusitis: Secondary | ICD-10-CM | POA: Diagnosis not present

## 2016-12-26 DIAGNOSIS — R05 Cough: Secondary | ICD-10-CM | POA: Diagnosis not present

## 2016-12-27 ENCOUNTER — Other Ambulatory Visit: Payer: Self-pay | Admitting: Endocrinology

## 2017-01-13 ENCOUNTER — Other Ambulatory Visit: Payer: Self-pay | Admitting: Endocrinology

## 2017-01-17 ENCOUNTER — Ambulatory Visit: Payer: Medicare Other | Admitting: Endocrinology

## 2017-01-29 DIAGNOSIS — N183 Chronic kidney disease, stage 3 (moderate): Secondary | ICD-10-CM | POA: Diagnosis not present

## 2017-01-29 DIAGNOSIS — M109 Gout, unspecified: Secondary | ICD-10-CM | POA: Diagnosis not present

## 2017-01-29 DIAGNOSIS — D631 Anemia in chronic kidney disease: Secondary | ICD-10-CM | POA: Diagnosis not present

## 2017-02-05 ENCOUNTER — Other Ambulatory Visit: Payer: Self-pay | Admitting: Endocrinology

## 2017-02-10 ENCOUNTER — Ambulatory Visit (INDEPENDENT_AMBULATORY_CARE_PROVIDER_SITE_OTHER): Payer: Medicare Other | Admitting: Endocrinology

## 2017-02-10 ENCOUNTER — Encounter: Payer: Self-pay | Admitting: Endocrinology

## 2017-02-10 VITALS — BP 129/53 | HR 61 | Wt 278.0 lb

## 2017-02-10 DIAGNOSIS — E119 Type 2 diabetes mellitus without complications: Secondary | ICD-10-CM

## 2017-02-10 DIAGNOSIS — Z125 Encounter for screening for malignant neoplasm of prostate: Secondary | ICD-10-CM

## 2017-02-10 DIAGNOSIS — Z119 Encounter for screening for infectious and parasitic diseases, unspecified: Secondary | ICD-10-CM | POA: Diagnosis not present

## 2017-02-10 DIAGNOSIS — Z0000 Encounter for general adult medical examination without abnormal findings: Secondary | ICD-10-CM | POA: Diagnosis not present

## 2017-02-10 LAB — LIPID PANEL
CHOL/HDL RATIO: 4
Cholesterol: 111 mg/dL (ref 0–200)
HDL: 27.7 mg/dL — ABNORMAL LOW (ref >39.00)
LDL Cholesterol: 51 mg/dL (ref 0–99)
NONHDL: 83.62
Triglycerides: 164 mg/dL — ABNORMAL HIGH (ref 0.0–149.0)
VLDL: 32.8 mg/dL (ref 0.0–40.0)

## 2017-02-10 LAB — TSH: TSH: 3.71 u[IU]/mL (ref 0.35–4.50)

## 2017-02-10 LAB — POCT GLYCOSYLATED HEMOGLOBIN (HGB A1C): HEMOGLOBIN A1C: 7.7

## 2017-02-10 LAB — PSA, MEDICARE: PSA: 0.81 ng/ml (ref 0.10–4.00)

## 2017-02-10 MED ORDER — B-12 1000 MCG PO TABS: 1.0000 | ORAL_TABLET | Freq: Every day | ORAL | 3 refills | Status: DC

## 2017-02-10 MED ORDER — INSULIN REGULAR HUMAN 100 UNIT/ML IJ SOLN: INTRAMUSCULAR | 11 refills | Status: DC

## 2017-02-10 NOTE — Progress Notes (Signed)
Subjective:    Patient ID: Donald [name], male    DOB: 1945/01/28, 72 y.o.   MRN: [id number]  HPI Pt returns for f/u of diabetes mellitus: DM type: Insulin-requiring type 2 Dx'ed: 7425 Complications: polyneuropathy, CAD and renal insufficiency Therapy: insulin since 1995 DKA: never.  Severe hypoglycemia: never.  Pancreatitis: never.  Other: he takes multiple daily injections; he takes human insulin, due to cost; he declines weight-loss surgery; he has severe insulin resistance; he declines pump.   Interval history: He brings a record of his cbg's which I have reviewed today.  All are in the mid-100's.  There is no trend throughout the day.  He often eats 2 meals per day, so he takes only 2 injections of novolog.  Past Medical History:  Diagnosis Date  . ANEMIA-IRON DEFICIENCY 07/26/2006  . Anxiety   . ASTHMA 07/26/2006  . Asthma   . Back pain, chronic   . Bell's palsy   . Chronic kidney disease    stage 3 renal failure  . COLONIC POLYPS, HX OF 07/26/2006  . D V T 12/03/2007  . DEPRESSION 03/14/2009  . DIABETES MELLITUS, TYPE II 07/26/2006  . [place] DISEASE, LUMBAR 10/05/2007  . DYSLIPIDEMIA 04/13/2009  . Edema    ankle  . GERD 07/26/2006  . Gout   . Heart murmur   . History of kidney stones   . HYPERTENSION 07/26/2006  . INSOMNIA 08/21/2007  . Neuropathy   . OBSTRUCTIVE SLEEP APNEA 12/03/2007   Use C-PAP  . PERIPHERAL NEUROPATHY 07/26/2006  . Pernicious anemia 11/20/2006  . PULMONARY EMBOLISM 10/05/2007  . Renal insufficiency   . Shortness of breath dyspnea     Past Surgical History:  Procedure Laterality Date  . APPENDECTOMY  1968  . [place], 06/14/2008   Dr. Trenton Gammon at [facility] (06/10)  . CARDIAC CATHETERIZATION    . CARPAL TUNNEL RELEASE Right   . CATARACT EXTRACTION W/PHACO Left 05/30/2014   Procedure: CATARACT EXTRACTION PHACO AND INTRAOCULAR LENS PLACEMENT (IOC);  Surgeon: Estill Cotta, MD;  Location: [facility] ORS;  Service: Ophthalmology;   Laterality: Left;  [name] 01:20 AP% 23.7 CDE 31.86  . EYE SURGERY Left    Catarct Extraction with IOL  . LITHOTRIPSY     X 2  . PARATHYROIDECTOMY Right 04/18/2016   Procedure: PARATHYROIDECTOMY;  Surgeon: [name], MD;  Location: [facility] ORS;  Service: ENT;  Laterality: Right;  . SHOULDER ARTHROSCOPY W/ ROTATOR CUFF REPAIR Right   . TONSILLECTOMY      Social History   Socioeconomic History  . Marital status: Married    Spouse name: Not on file  . Number of children: Not on file  . Years of education: Not on file  . Highest education level: Not on file  Social Needs  . Financial resource strain: Not on file  . Food insecurity - worry: Not on file  . Food insecurity - inability: Not on file  . Transportation needs - medical: Not on file  . Transportation needs - non-medical: Not on file  Occupational History  . Occupation: Disabled    Employer: [employer]  Tobacco Use  . Smoking status: Never Smoker  . Smokeless tobacco: Never Used  Substance and Sexual Activity  . Alcohol use: No  . Drug use: No  . Sexual activity: Not on file  Other Topics Concern  . 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  Activity is severely limited by medical problems   Diet is "good".   Never a smoker   Alcohol: none          Current Outpatient Medications on File Prior to Visit  Medication Sig Dispense Refill  . allopurinol (ZYLOPRIM) 300 MG tablet TAKE ONE TABLET BY MOUTH EVERY DAY 30 tablet 2  . ALPRAZolam (XANAX) 1 MG tablet TAKE ONE TABLET AT BEDTIME AS NEEDED FORSLEEP 30 tablet 3  . aspirin 81 MG chewable tablet Chew 81 mg by mouth daily.    . calcium carbonate (TUMS - DOSED IN MG ELEMENTAL CALCIUM) 500 MG chewable tablet Chew 5 tablets (1,000 mg of elemental calcium total) by mouth 3 (three) times daily with meals. (Patient taking differently: Chew 1,000 mg of elemental calcium by mouth 2 (two) times daily with  a meal. ) 100 tablet 0  . colchicine (COLCRYS) 0.6 MG tablet TAKE 1 TABLET BY MOUTH EVERY HOUR UNTIL SYMPTOMS ARE BETTER, MAX OF 6TABS PER DAY. STOP IF DIARRHEA 30 tablet 4  . furosemide (LASIX) 80 MG tablet TAKE ONE TABLET BY MOUTH EVERY DAY 90 tablet 2  . gabapentin (NEURONTIN) 600 MG tablet TAKE 1 TABLET BY MOUTH 3 TIMES DAILY 90 tablet 11  . GE100 BLOOD GLUCOSE TEST test strip TEST 3-4 TIMES DAILY 120 each 11  . HYDROcodone-acetaminophen (NORCO) 10-325 MG tablet Take 1 tablet by mouth every 4 (four) hours as needed. 30 tablet 0  . insulin aspart (NOVOLOG) 100 UNIT/ML injection 3 times a day (just before each meal) (200-170-260) 210 mL 3  . losartan (COZAAR) 100 MG tablet TAKE ONE TABLET BY MOUTH AT BEDTIME 90 tablet 3  . lovastatin (MEVACOR) 40 MG tablet TAKE TWO TABLETS BY MOUTH AT BEDTIME 60 tablet 2  . Naphazoline-Pheniramine (OPCON-A OP) Place 1 drop into both eyes 2 (two) times daily as needed.    [place] Kitchen NOVOLIN N 100 UNIT/ML injection INJECT 280 UNITS INTO THE SKIN AT BEDTIME 90 mL 2  . ondansetron (ZOFRAN) 4 MG tablet Take 1 tablet (4 mg total) by mouth every 4 (four) hours as needed for nausea. 20 tablet 0  . Sennosides-Docusate Sodium (STOOL SOFTENER & LAXATIVE PO) Take 2 tablets by mouth at bedtime.    . sildenafil (VIAGRA) 100 MG tablet Take 1 tablet (100 mg total) by mouth daily as needed for erectile dysfunction. 90 tablet 3   No current facility-administered medications on file prior to visit.     Allergies  Allergen Reactions  . Amlodipine Other (See Comments)    Leg swelling   . Codeine Other (See Comments)    REACTION: chest pain  . Pioglitazone Other (See Comments)    REACTION to Actos: swelling in ankles    Family History  Problem Relation Age of Onset  . Cancer Mother        Breast Cancer  . Cancer Sister        Breast Cancer  . Diabetes Father   . Heart disease Father   . Diabetes Paternal Grandfather     BP (!) 129/53 (BP Location: Left Arm, Patient  Position: Sitting, Cuff Size: Normal)   Pulse 61   Wt 278 lb (126.1 kg)   SpO2 95%   BMI 44.87 kg/m    Review of Systems He denies hypoglycemia    Objective:   Physical Exam VITAL SIGNS:  See vs page.  GENERAL: no distress.  Pulses: foot pulses are intact bilaterally.   MSK: no deformity of the feet or ankles.  CV: 1+ bilat edema of the legs.  Skin:  no ulcer on the feet or ankles.  normal color and temp on the feet and ankles.   Neuro: sensation is intact to touch on the feet and ankles,  but decreased from normal.   Lab Results  Component Value Date   HGBA1C 7.7 02/10/2017      Assessment & Plan:  Insulin-requiring type 2 DM, with renal insuff: Needs increased rx, if it can be done with a regimen that avoids or minimizes hypoglycemia.  However, pt requests to change to human insulin, so we'll make this change first Dyslipidemia: recheck today.  Subjective:   Patient here for Medicare annual wellness visit and management of other chronic and acute problems.     Risk factors: advanced age    44 of Physicians Providing Medical Care to Patient:  See "snapshot"   Activities of Daily Living: In your present state of health, do you have any difficulty performing the following activities (lives with wife)?.   Preparing food and eating?: No  Bathing yourself: No  Getting dressed: No  Using the toilet: No  Moving around from place to place: No  In the past year have you fallen or had a near fall?: No    Home Safety: Has smoke detector and wears seat belts. No firearms. No excess sun exposure.    Opioid Use: none   Diet and Exercise  Current exercise habits: limited by health problems Dietary issues discussed: pt reports a fairly healthy diet   Depression Screen  Q1: Over the past two weeks, have you felt down, depressed or hopeless? no  Q2: Over the past two weeks, have you felt little interest or pleasure in doing things? no   The following portions of the  patient's history were reviewed and updated as appropriate: allergies, current medications, past family history, past medical history, past social history, past surgical history and problem list.   Review of Systems  Denies hearing loss, and visual loss.  Objective:   Vision:  Advertising account executive, so he declines VA today Hearing: grossly normal Body mass index:  See vs page Msk: pt easily and quickly performs "get-up-and-go" from a sitting position Cognitive Impairment Assessment: cognition, memory and judgment appear normal.  remembers 3/3 at 5 minutes.  excellent recall.  can easily read and write a sentence.  alert and oriented x 3   Assessment:   Medicare wellness utd on preventive parameters    Plan:   During the course of the visit the patient was educated and counseled about appropriate screening and preventive services including:        Fall prevention is advised today  Diabetes screening is UTD Nutrition counseling is offered  advanced directives/end of life addressed today:  see healthcare directives hyperlink  Vaccines are updated as needed  Patient Instructions (the written plan) was given to the patient.

## 2017-02-10 NOTE — Progress Notes (Signed)
we discussed code status.  pt requests full code, but would not want to be started or maintained on artificial life-support measures if there was not a reasonable chance of recovery 

## 2017-02-10 NOTE — Patient Instructions (Addendum)
I have sent a prescription to your pharmacy, to change novolog to novolin Reg (same amount Please continue the same NPH insulin. check your blood sugar twice a day.  vary the time of day when you check, between before the 3 meals, and at bedtime.  also check if you have symptoms of your blood sugar being too high or too low.  please keep a record of the readings and bring it to your next appointment here (or you can bring the meter itself).  You can write it on any piece of paper.  please call us sooner if your blood sugar goes below 70, or if you have a lot of readings over 200. Please consider these measures for your health:  minimize alcohol.  Do not use tobacco products.  Have a colonoscopy at least every 10 years from age 38.  Keep firearms safely stored.  Always use seat belts.  have working smoke alarms in your home.  See an eye doctor and dentist regularly.  Never drive under the influence of alcohol or drugs (including prescription drugs).  Those with fair skin should take precautions against the sun, and should carefully examine their skin once per month, for any new or changed moles. It is critically important to prevent falling down (keep floor areas well-lit, dry, and free of loose objects.  If you have a cane, walker, or wheelchair, you should use it, even for short trips around the house.  Wear flat-soled shoes.  Also, try not to rush).   Please come back for a follow-up appointment in 4 months.

## 2017-02-11 ENCOUNTER — Telehealth: Payer: Self-pay | Admitting: Endocrinology

## 2017-02-11 ENCOUNTER — Other Ambulatory Visit: Payer: Self-pay

## 2017-02-11 LAB — HEPATITIS C ANTIBODY
Hepatitis C Ab: NONREACTIVE
SIGNAL TO CUT-OFF: 0.01 (ref <1.00)

## 2017-02-11 MED ORDER — INSULIN REGULAR HUMAN 100 UNIT/ML IJ SOLN: INTRAMUSCULAR | 11 refills | Status: DC

## 2017-02-11 MED ORDER — INSULIN NPH (HUMAN) (ISOPHANE) 100 UNIT/ML ~~LOC~~ SUSP: 260.0000 [IU] | Freq: Every day | SUBCUTANEOUS | 5 refills | Status: DC

## 2017-02-11 NOTE — Telephone Encounter (Signed)
Done

## 2017-02-11 NOTE — Telephone Encounter (Signed)
°   insulin NPH Human (NOVOLIN N) 100 UNIT/ML injection     insulin regular (NOVOLIN R RELION) 100 units/mL injection   [place], [place]    Please call patient when it is sent over. Pt is at pharmacy

## 2017-02-12 NOTE — Telephone Encounter (Signed)
Spoke to patient. Gave results. Pt verbalized understanding.

## 2017-02-12 NOTE — Telephone Encounter (Signed)
-----   Message from Renato Shin, MD sent at 02/10/2017  6:06 PM EST ----- please call patient: Good results

## 2017-03-03 ENCOUNTER — Other Ambulatory Visit: Payer: Self-pay | Admitting: Endocrinology

## 2017-03-10 DIAGNOSIS — M5416 Radiculopathy, lumbar region: Secondary | ICD-10-CM | POA: Diagnosis not present

## 2017-03-25 DIAGNOSIS — M5136 Other intervertebral disc degeneration, lumbar region: Secondary | ICD-10-CM | POA: Diagnosis not present

## 2017-04-01 ENCOUNTER — Other Ambulatory Visit: Payer: Self-pay | Admitting: Endocrinology

## 2017-04-15 DIAGNOSIS — R05 Cough: Secondary | ICD-10-CM | POA: Diagnosis not present

## 2017-04-15 DIAGNOSIS — J209 Acute bronchitis, unspecified: Secondary | ICD-10-CM | POA: Diagnosis not present

## 2017-04-21 ENCOUNTER — Other Ambulatory Visit: Payer: Self-pay

## 2017-04-21 ENCOUNTER — Other Ambulatory Visit: Payer: Self-pay | Admitting: Endocrinology

## 2017-04-21 MED ORDER — GLUCOSE BLOOD VI STRP: ORAL_STRIP | 5 refills | Status: DC

## 2017-04-24 ENCOUNTER — Other Ambulatory Visit: Payer: Self-pay | Admitting: Endocrinology

## 2017-04-25 DIAGNOSIS — M961 Postlaminectomy syndrome, not elsewhere classified: Secondary | ICD-10-CM | POA: Diagnosis not present

## 2017-04-28 DIAGNOSIS — M5136 Other intervertebral disc degeneration, lumbar region: Secondary | ICD-10-CM | POA: Diagnosis not present

## 2017-04-28 DIAGNOSIS — M545 Low back pain: Secondary | ICD-10-CM | POA: Diagnosis not present

## 2017-04-29 ENCOUNTER — Telehealth: Payer: Self-pay | Admitting: *Deleted

## 2017-04-29 MED ORDER — GLUCOSE BLOOD VI STRP: ORAL_STRIP | 5 refills | Status: DC

## 2017-04-29 NOTE — Telephone Encounter (Signed)
Recevied fax from [facility] stating they do not sell GE test strips. I called pt. He states he fills his test strips at [place]. New Rx sent to [facility]. See meds.

## 2017-05-05 DIAGNOSIS — M5416 Radiculopathy, lumbar region: Secondary | ICD-10-CM | POA: Diagnosis not present

## 2017-05-05 DIAGNOSIS — K5903 Drug induced constipation: Secondary | ICD-10-CM | POA: Diagnosis not present

## 2017-05-05 DIAGNOSIS — M5136 Other intervertebral disc degeneration, lumbar region: Secondary | ICD-10-CM | POA: Diagnosis not present

## 2017-05-06 ENCOUNTER — Other Ambulatory Visit: Payer: Self-pay | Admitting: Endocrinology

## 2017-05-07 DIAGNOSIS — I739 Peripheral vascular disease, unspecified: Secondary | ICD-10-CM | POA: Insufficient documentation

## 2017-05-07 NOTE — Progress Notes (Signed)
Cardiology Office Note  Date:  05/08/2017   ID:  Donald [name], DOB 06/27/1945, MRN [id number]  PCP:  Renato Shin, MD   Chief Complaint  Patient presents with  . Other    12 month follow up. Patient denies chest pain and SOB. Meds reviewed verbally with patient.     HPI:   Donald [name] is a 72 year old male with history of  Long-standing diabetes, HBA1C 7.7 hyperlipidemia,  coronary artery disease,  Prior cardiac catheterization 2012 with 40% coronary disease noted Diffuse three-vessel coronary disease on CT scan PAD,   chronic kidney disease, hypercalcemia,   OSA on CPAP,  Surgery of his parathyroid Chronic back pain Who presents for follow-up of his coronary artery disease  3-year-old grandson  Chronic back pain Reports that he has some nerve pinching Slipped disk Nonoperable,  Has periodic cortisone shots Recent MRI by orthopedics  Nonsmoker, cholesterol well controlled on statin, lovastatin  Denies any chest pain concerning for angina No regular exercise program Helps to take care of his grandson Walks with a cane   poor sleep, chronic fatigue Compliant with his CPAP  EKG personally reviewed by myself on todays visit Shows normal sinus rhythm with rate 61 bpm T-wave abnormality V6, 1 and aVL  Other past medical history reviewed Abnormal EKG leading to stress testing on prior clinic visit This showed no significant ischemia  Had parathyroid, surgery Now with vertigo Following surgery Mild improvement with meclizine, though still symptomatic   previous PET/CT imaging   mild to moderate calcified plaque in the carotid arteries right greater than left Mild to moderate plaque in the aortic arch Diffuse three-vessel coronary calcifications He does have mild to moderate plaquing in the branches off the mid to distal descending aorta  staghorn left renal calculus  Recent parathyroid spectscan  showed a focal abnormal sestamibi location suspicious for  right inferior parathyroid adenoma. Nonspecific tracer localization within single normal-sized left axillary and right subpectoral lymph node.   elective parathyroidectomy on 02/29/2016.    PMH:   has a past medical history of ANEMIA-IRON DEFICIENCY (07/26/2006), Anxiety, ASTHMA (07/26/2006), Asthma, Back pain, chronic, Bell's palsy, Chronic kidney disease, COLONIC POLYPS, HX OF (07/26/2006), D V T (12/03/2007), DEPRESSION (03/14/2009), DIABETES MELLITUS, TYPE II (07/26/2006), DISC DISEASE, LUMBAR (10/05/2007), DYSLIPIDEMIA (04/13/2009), Edema, GERD (07/26/2006), Gout, Heart murmur, History of kidney stones, HYPERTENSION (07/26/2006), INSOMNIA (08/21/2007), Neuropathy, OBSTRUCTIVE SLEEP APNEA (12/03/2007), PERIPHERAL NEUROPATHY (07/26/2006), Pernicious anemia (11/20/2006), PULMONARY EMBOLISM (10/05/2007), Renal insufficiency, and Shortness of breath dyspnea.  PSH:    Past Surgical History:  Procedure Laterality Date  . APPENDECTOMY  1968  . [place], 06/14/2008   Dr. Trenton Gammon at [facility] (06/10)  . CARDIAC CATHETERIZATION    . CARPAL TUNNEL RELEASE Right   . CATARACT EXTRACTION W/PHACO Left 05/30/2014   Procedure: CATARACT EXTRACTION PHACO AND INTRAOCULAR LENS PLACEMENT (IOC);  Surgeon: Estill Cotta, MD;  Location: [facility] ORS;  Service: Ophthalmology;  Laterality: Left;  [name] 01:20 AP% 23.7 CDE 31.86  . EYE SURGERY Left    Catarct Extraction with IOL  . LITHOTRIPSY     X 2  . PARATHYROIDECTOMY Right 04/18/2016   Procedure: PARATHYROIDECTOMY;  Surgeon: [name], MD;  Location: [facility] ORS;  Service: ENT;  Laterality: Right;  . SHOULDER ARTHROSCOPY W/ ROTATOR CUFF REPAIR Right   . TONSILLECTOMY      Current Outpatient Medications  Medication Sig Dispense Refill  . allopurinol (ZYLOPRIM) 300 MG tablet TAKE ONE TABLET BY MOUTH EVERY DAY 30 tablet 2  .  ALPRAZolam (XANAX) 1 MG tablet TAKE ONE TABLET AT BEDTIME AS NEEDED FORSLEEP 30 tablet 3  . aspirin 81 MG chewable tablet Chew 81 mg by  mouth daily.    . calcium carbonate (TUMS - DOSED IN MG ELEMENTAL CALCIUM) 500 MG chewable tablet Chew 5 tablets (1,000 mg of elemental calcium total) by mouth 3 (three) times daily with meals. (Patient taking differently: Chew 1,000 mg of elemental calcium by mouth 2 (two) times daily with a meal. ) 100 tablet 0  . colchicine (COLCRYS) 0.6 MG tablet TAKE 1 TABLET BY MOUTH EVERY HOUR UNTIL SYMPTOMS ARE BETTER, MAX OF 6TABS PER DAY. STOP IF DIARRHEA 30 tablet 4  . Cyanocobalamin (B-12) 1000 MCG TABS Take 1 tablet by mouth daily. 100 tablet 3  . furosemide (LASIX) 80 MG tablet TAKE ONE TABLET BY MOUTH EVERY DAY 90 tablet 2  . gabapentin (NEURONTIN) 600 MG tablet TAKE 1 TABLET BY MOUTH 3 TIMES DAILY 90 tablet 11  . glucose blood (GE100 BLOOD GLUCOSE TEST) test strip TEST 3-4 TIMES DAILY AS DIRECTED 100 each 5  . HYDROcodone-acetaminophen (NORCO) 10-325 MG tablet Take 1 tablet by mouth every 4 (four) hours as needed. 30 tablet 0  . insulin NPH Human (NOVOLIN N) 100 UNIT/ML injection Inject 2.6 mLs (260 Units total) into the skin at bedtime. 90 mL 5  . insulin regular (NOVOLIN R RELION) 100 units/mL injection 3 times a day (just before each meal) (200-170-260) units 21 vial 11  . losartan (COZAAR) 100 MG tablet TAKE ONE TABLET BY MOUTH AT BEDTIME 90 tablet 3  . lovastatin (MEVACOR) 40 MG tablet TAKE TWO TABLETS BY MOUTH AT BEDTIME 60 tablet 2  . Naphazoline-Pheniramine (OPCON-A OP) Place 1 drop into both eyes 2 (two) times daily as needed.    Marland Kitchen NOVOLIN N 100 UNIT/ML injection INJECT 280 UNITS INTO THE SKIN AT BEDTIME 90 mL 2  . ondansetron (ZOFRAN) 4 MG tablet Take 1 tablet (4 mg total) by mouth every 4 (four) hours as needed for nausea. 20 tablet 0  . Sennosides-Docusate Sodium (STOOL SOFTENER & LAXATIVE PO) Take 2 tablets by mouth at bedtime.    . sildenafil (VIAGRA) 100 MG tablet Take 1 tablet (100 mg total) by mouth daily as needed for erectile dysfunction. 90 tablet 3   No current  facility-administered medications for this visit.      Allergies:   Amlodipine; Codeine; and Pioglitazone   Social History:  The patient  reports that he has never smoked. He has never used smokeless tobacco. He reports that he does not drink alcohol or use drugs.   Family History:   family history includes Cancer in his mother and sister; Diabetes in his father and paternal grandfather; Heart disease in his father.    Review of Systems: Review of Systems  Constitutional: Negative.   Respiratory: Negative.   Cardiovascular: Negative.   Gastrointestinal: Negative.   Musculoskeletal: Positive for back pain and joint pain.  Neurological: Negative.   Psychiatric/Behavioral: Negative.   All other systems reviewed and are negative.    PHYSICAL EXAM: VS:  BP (!) 152/52 (BP Location: Left Arm, Patient Position: Sitting, Cuff Size: Large)   Pulse 61   Ht 5\' 5"  (1.651 m)   Wt 279 lb 12 oz (126.9 kg)   BMI 46.55 kg/m  , BMI Body mass index is 46.55 kg/m.  Constitutional:  oriented to person, place, and time. No distress. Obese HENT:  Head: Normocephalic and atraumatic.  Eyes:  no discharge. No  scleral icterus.  Neck: Normal range of motion. Neck supple. No JVD present.  Cardiovascular: Normal rate, regular rhythm, normal heart sounds and intact distal pulses. Exam reveals no gallop and no friction rub. No edema No murmur heard.  Obese Pulmonary/Chest: Effort normal and breath sounds normal. No stridor. No respiratory distress.  no wheezes.  no rales.  no tenderness.  Abdominal: Soft.  no distension.  no tenderness.  Musculoskeletal: Normal range of motion.  no  tenderness or deformity.  Neurological:  normal muscle tone. Coordination normal. No atrophy Skin: Skin is warm and dry. No rash noted. not diaphoretic.  Psychiatric:  normal mood and affect. behavior is normal. Thought content normal.   Mildly elevated on today's visit with a low diastolic pressure Recent  Labs: 02/10/2017: TSH 3.71    Lipid Panel Lab Results  Component Value Date   CHOL 111 02/10/2017   HDL 27.70 (L) 02/10/2017   LDLCALC 51 02/10/2017   TRIG 164.0 (H) 02/10/2017      Wt Readings from Last 3 Encounters:  05/08/17 279 lb 12 oz (126.9 kg)  02/10/17 278 lb (126.1 kg)  09/02/16 280 lb 9.6 oz (127.3 kg)       ASSESSMENT AND PLAN:   Essential hypertension -  Mildly elevated on today's visit with low diastolic pressure  discussed his low diastolic with him, Discussed calculation of mean arterial pressure No medication changes made at this time  Dyslipidemia -  Cholesterol is at goal on the current lipid regimen. No changes to the medications were made. Stable, encouraged a strict low carbohydrate diet   Coronary artery disease involving native coronary artery of native heart with angina pectoris ([place]) Significant three-vessel coronary calcification seen on recent CT scan stress Myoview with no significant ischemia Prior cardiac catheterization in 2012 with known coronary disease mild in the left main, 40% in other vessels  No change in symptoms, no further testing at this time  Aortic atherosclerosis (HCC) Seen in the aortic arch and branches of the descending aorta Hemoglobin A1c continues to run high Cholesterol at goal  Morbid obesity ([place]) Unable to exercise secondary to orthopedic issues,  Stressed importance of low carbohydrate diet  Type 2 diabetes mellitus without complication, without long-term current use of insulin (HCC) Hemoglobin A1c of 7.7 Goal in the low 6 range Recommended weight loss  Lower extremity edema Likely venous insufficiency exacerbated by amlodipine and obesity Minimal on today's visit   Total encounter time more than 25 minutes  Greater than 50% was spent in counseling and coordination of care with the patient   Disposition:   F/U  12 months   Orders Placed This Encounter  Procedures  . EKG 12-Lead      Signed, Esmond Plants, M.D., Ph.D. 05/08/2017  [place], [place]

## 2017-05-08 ENCOUNTER — Ambulatory Visit (INDEPENDENT_AMBULATORY_CARE_PROVIDER_SITE_OTHER): Payer: Medicare Other | Admitting: Cardiovascular Disease

## 2017-05-08 VITALS — BP 152/52 | HR 61 | Ht 65.0 in | Wt 279.8 lb

## 2017-05-08 DIAGNOSIS — N183 Chronic kidney disease, stage 3 unspecified: Secondary | ICD-10-CM

## 2017-05-08 DIAGNOSIS — I2510 Atherosclerotic heart disease of native coronary artery without angina pectoris: Secondary | ICD-10-CM

## 2017-05-08 DIAGNOSIS — E1122 Type 2 diabetes mellitus with diabetic chronic kidney disease: Secondary | ICD-10-CM

## 2017-05-08 DIAGNOSIS — N189 Chronic kidney disease, unspecified: Secondary | ICD-10-CM | POA: Diagnosis not present

## 2017-05-08 DIAGNOSIS — Z794 Long term (current) use of insulin: Secondary | ICD-10-CM | POA: Diagnosis not present

## 2017-05-08 DIAGNOSIS — E785 Hyperlipidemia, unspecified: Secondary | ICD-10-CM

## 2017-05-08 DIAGNOSIS — I739 Peripheral vascular disease, unspecified: Secondary | ICD-10-CM

## 2017-05-08 DIAGNOSIS — M109 Gout, unspecified: Secondary | ICD-10-CM | POA: Diagnosis not present

## 2017-05-08 NOTE — Patient Instructions (Signed)
Medication Instructions:   No medication changes made  Research CBD oil  Labwork:  No new labs needed  Testing/Procedures:  No further testing at this time   Follow-Up: It was a pleasure seeing you in the office today. Please call us if you have new issues that need to be addressed before your next appt.  [phone]  Your physician wants you to follow-up in: 12 months.  You will receive a reminder letter in the mail two months in advance. If you don't receive a letter, please call our office to schedule the follow-up appointment.  If you need a refill on your cardiac medications before your next appointment, please call your pharmacy.  For educational health videos Log in to : [email or web address] Or : [email or web address], password : [email or web address]

## 2017-05-10 ENCOUNTER — Other Ambulatory Visit: Payer: Self-pay | Admitting: Endocrinology

## 2017-05-12 DIAGNOSIS — Z9989 Dependence on other enabling machines and devices: Secondary | ICD-10-CM | POA: Diagnosis not present

## 2017-05-12 DIAGNOSIS — Z6841 Body Mass Index (BMI) 40.0 and over, adult: Secondary | ICD-10-CM | POA: Diagnosis not present

## 2017-05-12 DIAGNOSIS — D351 Benign neoplasm of parathyroid gland: Secondary | ICD-10-CM | POA: Diagnosis not present

## 2017-05-12 DIAGNOSIS — M109 Gout, unspecified: Secondary | ICD-10-CM | POA: Diagnosis not present

## 2017-05-12 DIAGNOSIS — N2581 Secondary hyperparathyroidism of renal origin: Secondary | ICD-10-CM | POA: Diagnosis not present

## 2017-05-12 DIAGNOSIS — N183 Chronic kidney disease, stage 3 (moderate): Secondary | ICD-10-CM | POA: Diagnosis not present

## 2017-05-12 DIAGNOSIS — D509 Iron deficiency anemia, unspecified: Secondary | ICD-10-CM | POA: Diagnosis not present

## 2017-05-12 DIAGNOSIS — I129 Hypertensive chronic kidney disease with stage 1 through stage 4 chronic kidney disease, or unspecified chronic kidney disease: Secondary | ICD-10-CM | POA: Diagnosis not present

## 2017-05-12 DIAGNOSIS — E1122 Type 2 diabetes mellitus with diabetic chronic kidney disease: Secondary | ICD-10-CM | POA: Diagnosis not present

## 2017-05-12 DIAGNOSIS — G4733 Obstructive sleep apnea (adult) (pediatric): Secondary | ICD-10-CM | POA: Diagnosis not present

## 2017-05-21 ENCOUNTER — Encounter: Payer: Self-pay | Admitting: Endocrinology

## 2017-05-21 DIAGNOSIS — E113293 Type 2 diabetes mellitus with mild nonproliferative diabetic retinopathy without macular edema, bilateral: Secondary | ICD-10-CM | POA: Diagnosis not present

## 2017-05-21 LAB — HM DIABETES EYE EXAM

## 2017-06-05 DIAGNOSIS — M5136 Other intervertebral disc degeneration, lumbar region: Secondary | ICD-10-CM | POA: Diagnosis not present

## 2017-06-05 DIAGNOSIS — I129 Hypertensive chronic kidney disease with stage 1 through stage 4 chronic kidney disease, or unspecified chronic kidney disease: Secondary | ICD-10-CM | POA: Diagnosis not present

## 2017-06-06 ENCOUNTER — Other Ambulatory Visit: Payer: Self-pay | Admitting: Endocrinology

## 2017-06-10 ENCOUNTER — Ambulatory Visit: Payer: Medicare Other | Admitting: Endocrinology

## 2017-06-13 DIAGNOSIS — R221 Localized swelling, mass and lump, neck: Secondary | ICD-10-CM | POA: Diagnosis not present

## 2017-06-13 DIAGNOSIS — H6063 Unspecified chronic otitis externa, bilateral: Secondary | ICD-10-CM | POA: Diagnosis not present

## 2017-06-13 DIAGNOSIS — H6121 Impacted cerumen, right ear: Secondary | ICD-10-CM | POA: Diagnosis not present

## 2017-07-15 DIAGNOSIS — Z79891 Long term (current) use of opiate analgesic: Secondary | ICD-10-CM | POA: Diagnosis not present

## 2017-07-15 DIAGNOSIS — M5136 Other intervertebral disc degeneration, lumbar region: Secondary | ICD-10-CM | POA: Diagnosis not present

## 2017-07-15 DIAGNOSIS — K5903 Drug induced constipation: Secondary | ICD-10-CM | POA: Diagnosis not present

## 2017-07-31 ENCOUNTER — Other Ambulatory Visit: Payer: Self-pay | Admitting: Endocrinology

## 2017-08-07 ENCOUNTER — Ambulatory Visit (INDEPENDENT_AMBULATORY_CARE_PROVIDER_SITE_OTHER): Payer: Medicare Other | Admitting: Endocrinology

## 2017-08-07 ENCOUNTER — Encounter: Payer: Self-pay | Admitting: Endocrinology

## 2017-08-07 ENCOUNTER — Other Ambulatory Visit: Payer: Self-pay

## 2017-08-07 VITALS — BP 136/64 | HR 59 | Ht 65.0 in | Wt 274.8 lb

## 2017-08-07 DIAGNOSIS — E119 Type 2 diabetes mellitus without complications: Secondary | ICD-10-CM

## 2017-08-07 DIAGNOSIS — I2510 Atherosclerotic heart disease of native coronary artery without angina pectoris: Secondary | ICD-10-CM

## 2017-08-07 LAB — POCT GLYCOSYLATED HEMOGLOBIN (HGB A1C): Hemoglobin A1C: 7.5 % — AB (ref 4.0–5.6)

## 2017-08-07 MED ORDER — LOVASTATIN 40 MG PO TABS: 80.0000 mg | ORAL_TABLET | Freq: Every day | ORAL | 3 refills | Status: DC

## 2017-08-07 MED ORDER — GABAPENTIN 600 MG PO TABS: 600.0000 mg | ORAL_TABLET | Freq: Two times a day (BID) | ORAL | 11 refills | Status: DC

## 2017-08-07 NOTE — Progress Notes (Signed)
Subjective:    Patient ID: Donald [name], male    DOB: January 20, 1945, 72 y.o.   MRN: [id number]  HPI Pt returns for f/u of diabetes mellitus: DM type: Insulin-requiring type 2 Dx'ed: 1962 Complications: polyneuropathy, CAD and renal insufficiency Therapy: insulin since 1995 DKA: never.  Severe hypoglycemia: never.  Pancreatitis: never.  Other: Donald [name] takes multiple daily injections; Donald [name] takes human insulin, due to cost; Donald [name] declines weight-loss surgery; Donald [name] has severe insulin resistance; Donald [name] declines pump; Donald [name] often eats 2 meals per day, so Donald [name] takes only 2 injections of novolog.  Interval history: Last 2 steroid injections into the lower back were 7 weeks ago.  no cbg record, but states cbg's vary from 99-165 (except for the few days after steroid shots).  There is no trend throughout the day.   Past Medical History:  Diagnosis Date  . ANEMIA-IRON DEFICIENCY 07/26/2006  . Anxiety   . ASTHMA 07/26/2006  . Asthma   . Back pain, chronic   . Bell's palsy   . Chronic kidney disease    stage 3 renal failure  . COLONIC POLYPS, HX OF 07/26/2006  . D V T 12/03/2007  . DEPRESSION 03/14/2009  . DIABETES MELLITUS, TYPE II 07/26/2006  . [place] DISEASE, LUMBAR 10/05/2007  . DYSLIPIDEMIA 04/13/2009  . Edema    ankle  . GERD 07/26/2006  . Gout   . Heart murmur   . History of kidney stones   . HYPERTENSION 07/26/2006  . INSOMNIA 08/21/2007  . Neuropathy   . OBSTRUCTIVE SLEEP APNEA 12/03/2007   Use C-PAP  . PERIPHERAL NEUROPATHY 07/26/2006  . Pernicious anemia 11/20/2006  . PULMONARY EMBOLISM 10/05/2007  . Renal insufficiency   . Shortness of breath dyspnea     Past Surgical History:  Procedure Laterality Date  . APPENDECTOMY  1968  . [place], 06/14/2008   Dr. Trenton [facility] at [facility] (06/10)  . CARDIAC CATHETERIZATION    . CARPAL TUNNEL RELEASE Right   . CATARACT EXTRACTION W/PHACO Left 05/30/2014   Procedure: CATARACT EXTRACTION PHACO AND INTRAOCULAR LENS PLACEMENT (IOC);  Surgeon:  Estill Cotta, MD;  Location: [facility] ORS;  Service: Ophthalmology;  Laterality: Left;  [name] 01:20 AP% 23.7 CDE 31.86  . EYE SURGERY Left    Catarct Extraction with IOL  . LITHOTRIPSY     X 2  . PARATHYROIDECTOMY Right 04/18/2016   Procedure: PARATHYROIDECTOMY;  Surgeon: [name], MD;  Location: [facility] ORS;  Service: ENT;  Laterality: Right;  . SHOULDER ARTHROSCOPY W/ ROTATOR CUFF REPAIR Right   . TONSILLECTOMY      Social History   Socioeconomic History  . Marital status: Married    Spouse name: Not on file  . Number of children: Not on file  . Years of education: Not on file  . Highest education level: Not on file  Occupational History  . Occupation: Disabled    Employer: [employer]  Social Needs  . Financial resource strain: Not on file  . Food insecurity:    Worry: Not on file    Inability: Not on file  . Transportation needs:    Medical: Not on file    Non-medical: Not on file  Tobacco Use  . Smoking status: Never Smoker  . Smokeless tobacco: Never Used  Substance and Sexual Activity  . Alcohol use: No  . Drug use: No  . Sexual activity: Not on file  Lifestyle  . Physical activity:    Days per week: Not on file  Minutes per session: Not on file  . Stress: Not on file  Relationships  . Social connections:    Talks on phone: Not on file    Gets together: Not on file    Attends religious service: Not on file    Active member of club or organization: Not on file    Attends meetings of clubs or organizations: Not on file    Relationship status: Not on file  . Intimate partner violence:    Fear of current or ex partner: Not on file    Emotionally abused: Not on file    Physically abused: Not on file    Forced sexual activity: Not on file  Other Topics Concern  . 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   Activity is severely limited by medical problems   Diet is "good".   Never  a smoker   Alcohol: none          Current Outpatient Medications on File Prior to Visit  Medication Sig Dispense Refill  . amLODipine (NORVASC) 2.5 MG tablet Take 2.5 mg by mouth daily.    Marland Kitchen allopurinol (ZYLOPRIM) 300 MG tablet TAKE 1 TABLET DAILY 30 tablet 2  . ALPRAZolam (XANAX) 1 MG tablet TAKE ONE TABLET AT BEDTIME AS NEEDED FORSLEEP 30 tablet 5  . aspirin 81 MG chewable tablet Chew 81 mg by mouth daily.    . calcium carbonate (TUMS - DOSED IN MG ELEMENTAL CALCIUM) 500 MG chewable tablet Chew 5 tablets (1,000 mg of elemental calcium total) by mouth 3 (three) times daily with meals. (Patient taking differently: Chew 1,000 mg of elemental calcium by mouth 2 (two) times daily with a meal. ) 100 tablet 0  . colchicine (COLCRYS) 0.6 MG tablet TAKE 1 TABLET BY MOUTH EVERY HOUR UNTIL SYMPTOMS ARE BETTER, MAX OF 6TABS PER DAY. STOP IF DIARRHEA 30 tablet 4  . Cyanocobalamin (B-12) 1000 MCG TABS Take 1 tablet by mouth daily. 100 tablet 3  . furosemide (LASIX) 80 MG tablet TAKE ONE TABLET BY MOUTH EVERY DAY 90 tablet 2  . glucose blood (GE100 BLOOD GLUCOSE TEST) test strip TEST 3-4 TIMES DAILY AS DIRECTED 100 each 5  . insulin NPH Human (NOVOLIN N) 100 UNIT/ML injection Inject 2.6 mLs (260 Units total) into the skin at bedtime. 90 mL 5  . insulin regular (NOVOLIN R RELION) 100 units/mL injection 3 times a day (just before each meal) (200-170-260) units 21 vial 11  . losartan (COZAAR) 100 MG tablet TAKE ONE TABLET BY MOUTH AT BEDTIME 90 tablet 3  . Naphazoline-Pheniramine (OPCON-A OP) Place 1 drop into both eyes 2 (two) times daily as needed.    Marland Kitchen NOVOLIN N 100 UNIT/ML injection INJECT 280 UNITS INTO THE SKIN AT BEDTIME 90 mL 2  . ondansetron (ZOFRAN) 4 MG tablet Take 1 tablet (4 mg total) by mouth every 4 (four) hours as needed for nausea. 20 tablet 0  . Sennosides-Docusate Sodium (STOOL SOFTENER & LAXATIVE PO) Take 2 tablets by mouth at bedtime.    . sildenafil (VIAGRA) 100 MG tablet Take 1 tablet  (100 mg total) by mouth daily as needed for erectile dysfunction. 90 tablet 3   No current facility-administered medications on file prior to visit.     Allergies  Allergen Reactions  . Codeine Other (See Comments)    REACTION: chest pain  . Pioglitazone Other (See Comments)    REACTION  to Actos: swelling in ankles    Family History  Problem Relation Age of Onset  . Cancer Mother        Breast Cancer  . Cancer Sister        Breast Cancer  . Diabetes Father   . Heart disease Father   . Diabetes Paternal Grandfather     BP 136/64 (BP Location: Right Arm, Patient Position: Sitting, Cuff Size: Normal)   Pulse (!) 59   Ht 5\' 5"  (1.651 m)   Wt 274 lb 12.8 oz (124.6 kg)   SpO2 91%   BMI 45.73 kg/m    Review of Systems Donald [name] has fatigue and insomnia.  Donald [name] has lost 4 lbs, since last ov.      Objective:   Physical Exam VITAL SIGNS:  See vs page GENERAL: no distress Pulses: dorsalis pedis intact bilat.   MSK: no deformity of the feet CV: 2+ bilat leg edema Skin:  no ulcer on the feet.  normal color and temp on the feet.  Neuro: sensation is intact to touch on the feet.     Lab Results  Component Value Date   HGBA1C 7.5 (A) 08/07/2017   Lab Results  Component Value Date   CREATININE 1.44 (H) 04/15/2016   BUN 28 (H) 04/15/2016   NA 142 04/15/2016   K 4.2 04/15/2016   CL 106 04/15/2016   CO2 29 04/15/2016       Assessment & Plan:  Insulin-requiring type 2 DM, with renal insuff: well-controlled. Low-back pain: steroid injections are affecting a1c. Insomnia: we'll adjust gabapentin for this.  HTN: well-controlled.    Patient Instructions  Please continue the same medication for blood pressure, and the same insulin. Try taking all of the gabapentin at night. check your blood sugar twice a day.  vary the time of day when you check, between before the 3 meals, and at bedtime.  also check if you have symptoms of your blood sugar being too high or too low.  please keep  a record of the readings and bring it to your next appointment here (or you can bring the meter itself).  You can write it on any piece of paper.  please call us sooner if your blood sugar goes below 70, or if you have a lot of readings over 200.  Please come back for a follow-up appointment in 3 months.

## 2017-08-07 NOTE — Patient Instructions (Addendum)
Please continue the same medication for blood pressure, and the same insulin. Try taking all of the gabapentin at night. check your blood sugar twice a day.  vary the time of day when you check, between before the 3 meals, and at bedtime.  also check if you have symptoms of your blood sugar being too high or too low.  please keep a record of the readings and bring it to your next appointment here (or you can bring the meter itself).  You can write it on any piece of paper.  please call us sooner if your blood sugar goes below 70, or if you have a lot of readings over 200.  Please come back for a follow-up appointment in 3 months.

## 2017-08-11 IMAGING — CT NM PET TUM IMG INITIAL (PI) SKULL BASE T - THIGH
9 series · 25 of 25 positions shown · non-contrast
Comparison: 02/05/2016 sestamibi SPECT-CT. 11/09/2010 CT angiogram
of the chest, abdomen and pelvis.

CLINICAL DATA: Initial treatment strategy for 70-year-old male with
hyperparathyroidism and focal abnormal sestamibi localization
suspicious for a right inferior parathyroid adenoma on recent
sestamibi SPECT-CT, which also demonstrated nonspecific tracer
localization within left axillary and right subpectoral lymph nodes.

EXAM:
NUCLEAR MEDICINE PET SKULL BASE TO THIGH
TECHNIQUE: 12.2 mCi F-18 FDG was injected intravenously. Full-ring PET imaging
was performed from the skull base to thigh after the radiotracer. CT
data was obtained and used for attenuation correction and anatomic
localization.
FASTING BLOOD GLUCOSE:  Value: 83 mg/dl

[Series 3: ct wb 5.0 b30f · axial · 5.0mm · 0.98mm/px · z∈[-1554,-570]mm · 3 of 329 slices shown]
[im 1/329]
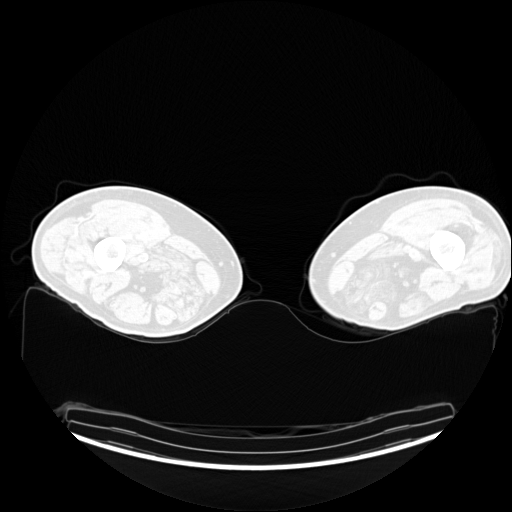
[im 165/329]
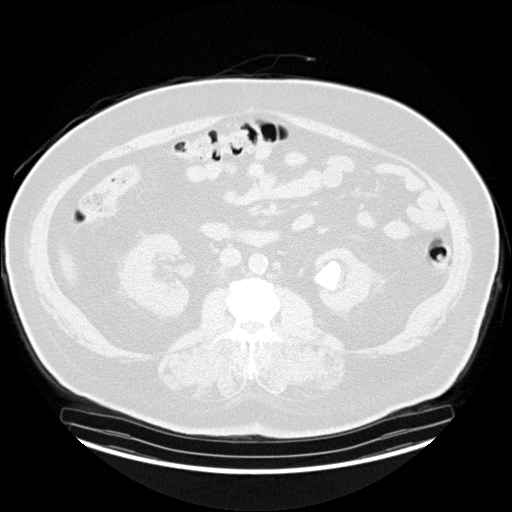
[im 329/329  brain]
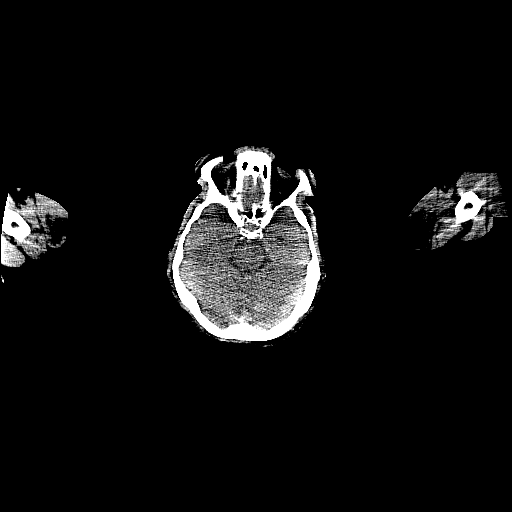

[Series 4: pet wb (ac) · axial · 5.0mm · 4.07mm/px · z∈[-1554,-570]mm · 4 of 329 slices shown]
[im 1/329]
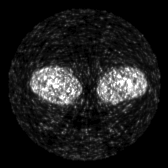
[im 110/329]
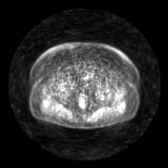
[im 219/329]
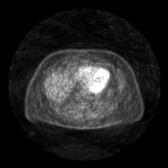
[im 329/329]
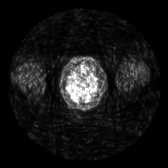

[Series 5: pet wb uncorrected (nac) · axial · 5.0mm · 4.07mm/px · z∈[-1554,-570]mm · 4 of 329 slices shown]
[im 1/329]
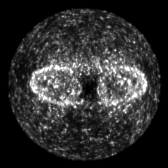
[im 110/329]
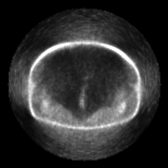
[im 219/329]
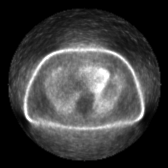
[im 329/329]
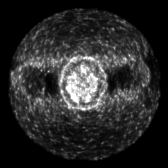

[Series 603: pet axial fused · 4 of 326 slices shown]
[im 1/326]
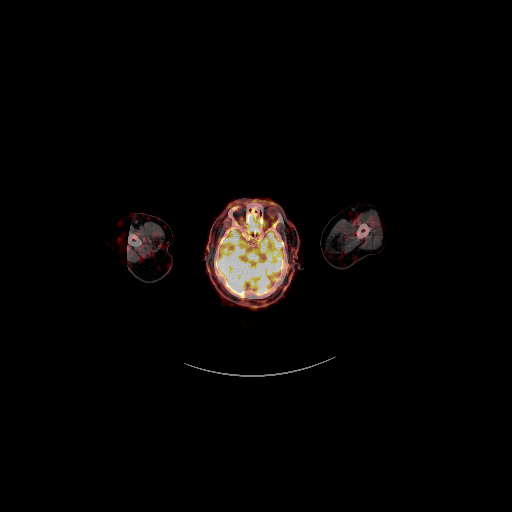
[im 109/326]
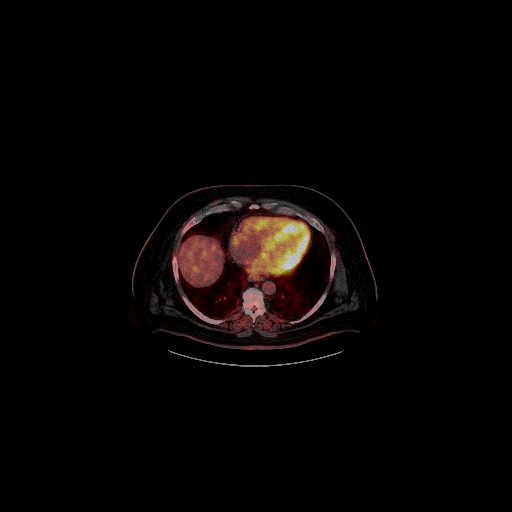
[im 217/326]
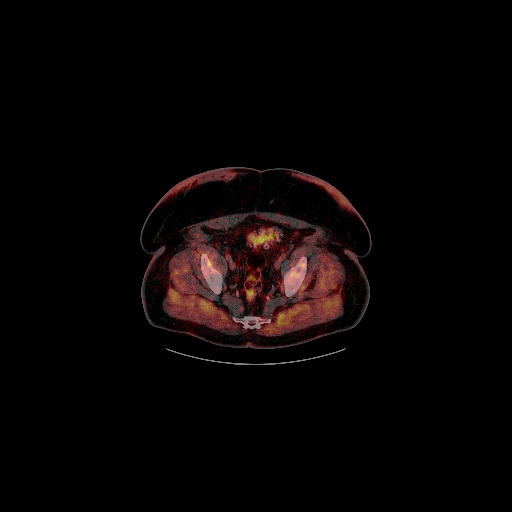
[im 326/326]
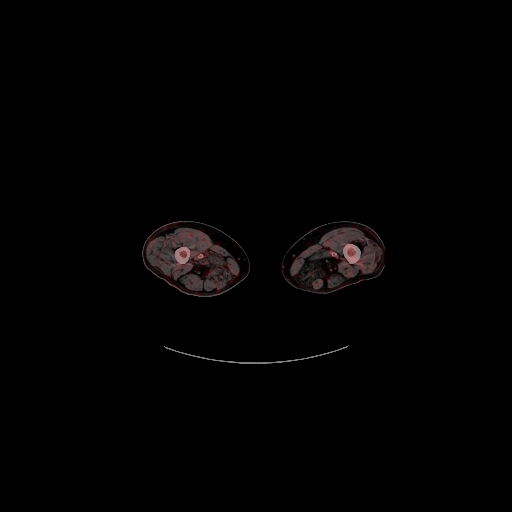

[Series 604: pet coronal fused · 1 of 98 slices shown]
[im 1/98]
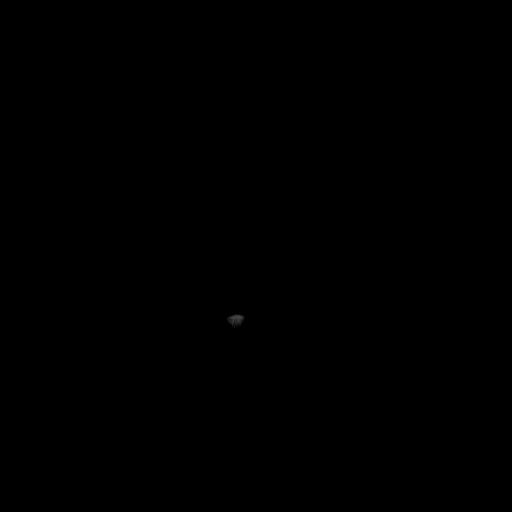

[Series 605: pet sagittal fused · 2 of 168 slices shown]
[im 1/168]
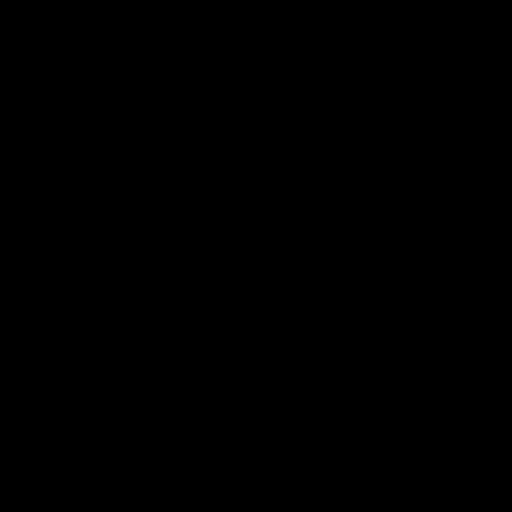
[im 168/168]
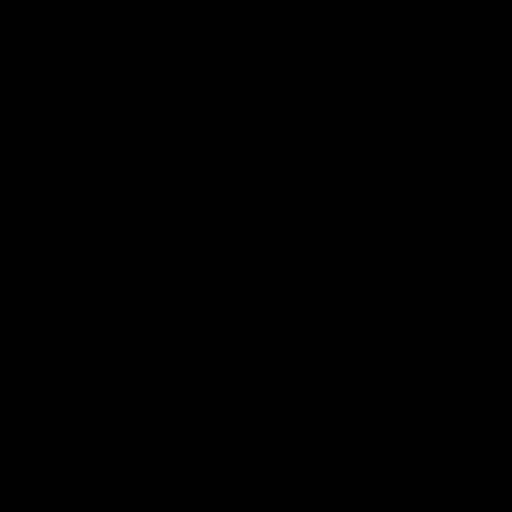

[Series 606: pet axial · 4 of 328 slices shown]
[im 1/328]
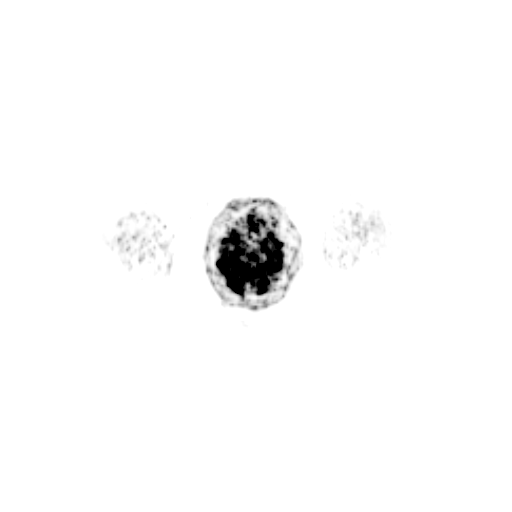
[im 110/328]
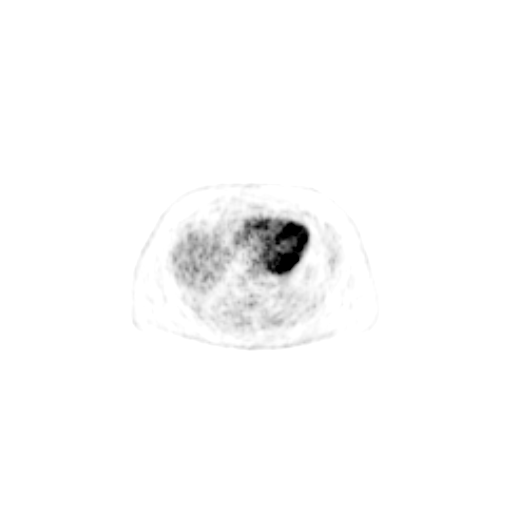
[im 219/328]
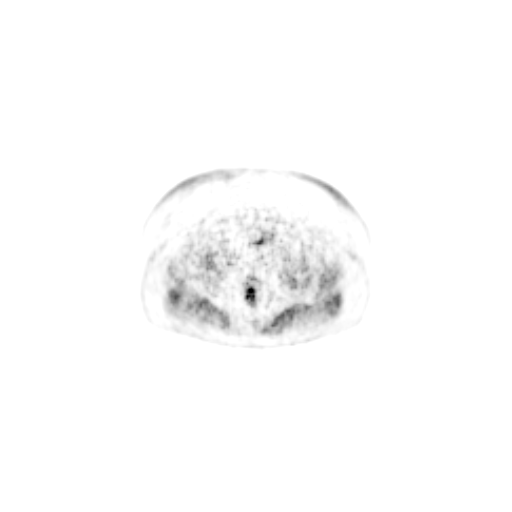
[im 328/328]
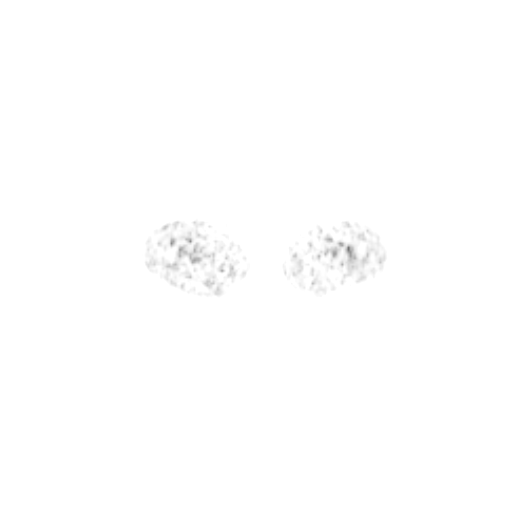

[Series 607: pet coronal · 1 of 119 slices shown]
[im 1/119]
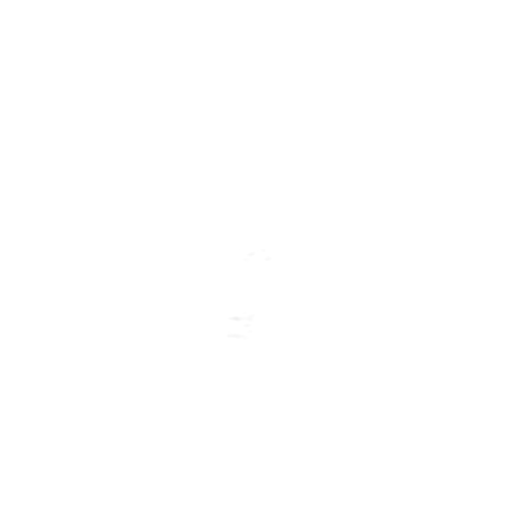

[Series 608: pet sagittal · 2 of 188 slices shown]
[im 1/188]
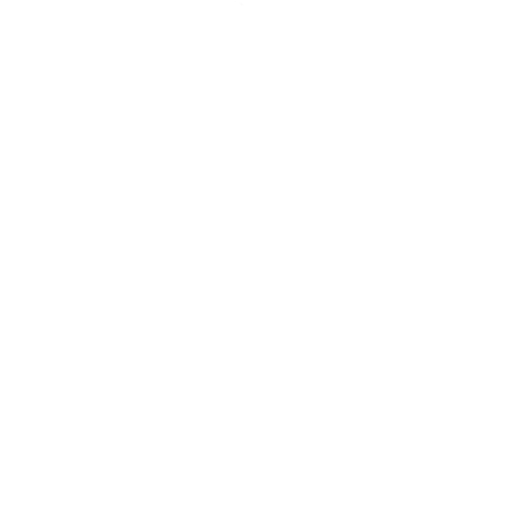
[im 188/188]
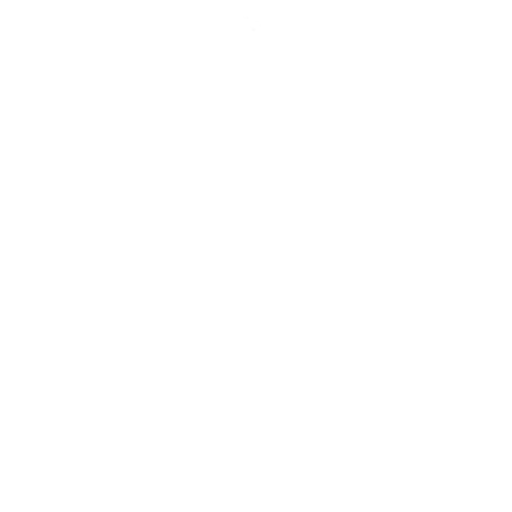

[25 of 25 positions shown; findings below may reference images not displayed]

FINDINGS: NECK

No hypermetabolic lymph nodes in the neck. No hypermetabolic thyroid
nodules. No parathyroidal hypermetabolism.

CHEST

No hypermetabolic axillary, mediastinal or hilar nodes.
Specifically, no hypermetabolism in the right subpectoral or left
axillary nodes at the areas of abnormal sestamibi tracer uptake on
the recent sestamibi SPECT CT study. Mild cardiomegaly. Left main,
left anterior descending, left circumflex and right coronary
atherosclerosis. Atherosclerotic nonaneurysmal thoracic aorta. No
pleural effusions. No pneumothorax. No acute consolidative airspace
disease, lung masses or significant pulmonary nodules.

ABDOMEN/PELVIS

No abnormal hypermetabolic activity within the liver, pancreas,
adrenal glands, or spleen. No hypermetabolic lymph nodes in the
abdomen or pelvis. Cholelithiasis. Staghorn calculus in the lower
left renal collecting system measuring up to the 29 x 25 mm. Mild
caliectasis in the lower left renal collecting system.
Atherosclerotic nonaneurysmal abdominal aorta. Moderate sigmoid
diverticulosis. Mildly enlarged prostate.

Generalized hypermetabolism throughout the areas of skin thickening
and superficial subcutaneous fat stranding in the bilateral ventral
abdominal wall pannus. No discrete mass or fluid collection.

SKELETON

No focal hypermetabolic activity to suggest skeletal metastasis.
Status post bilateral posterior spinal fusion in the lower lumbar
spine. Soft tissue anchors in the left humeral head.
IMPRESSION: 1. No suspicious hypermetabolism. Specifically, no hypermetabolism
in the right subpectoral or left axillary lymph nodes at the areas
of abnormal sestamibi tracer uptake on the recent sestamibi SPECT-CT
study.
2. Generalized hypermetabolism throughout the areas of skin
thickening and superficial subcutaneous fat stranding symmetrically
involving the bilateral ventral abdominal wall pannus, suggesting
cellulitis. Recommend correlation with clinical exam.
3. Additional findings include aortic atherosclerosis, mild
cardiomegaly, left main and 3 vessel coronary atherosclerosis,
cholelithiasis, staghorn left renal calculus, moderate sigmoid
diverticulosis and mildly enlarged prostate.

## 2017-09-05 ENCOUNTER — Other Ambulatory Visit: Payer: Self-pay | Admitting: Endocrinology

## 2017-09-11 DIAGNOSIS — M109 Gout, unspecified: Secondary | ICD-10-CM | POA: Diagnosis not present

## 2017-09-11 DIAGNOSIS — N189 Chronic kidney disease, unspecified: Secondary | ICD-10-CM | POA: Diagnosis not present

## 2017-09-11 DIAGNOSIS — N183 Chronic kidney disease, stage 3 (moderate): Secondary | ICD-10-CM | POA: Diagnosis not present

## 2017-09-19 DIAGNOSIS — G5602 Carpal tunnel syndrome, left upper limb: Secondary | ICD-10-CM | POA: Insufficient documentation

## 2017-10-01 DIAGNOSIS — G5602 Carpal tunnel syndrome, left upper limb: Secondary | ICD-10-CM | POA: Diagnosis not present

## 2017-10-02 DIAGNOSIS — M5136 Other intervertebral disc degeneration, lumbar region: Secondary | ICD-10-CM | POA: Diagnosis not present

## 2017-10-02 DIAGNOSIS — Z79891 Long term (current) use of opiate analgesic: Secondary | ICD-10-CM | POA: Diagnosis not present

## 2017-10-06 DIAGNOSIS — G5602 Carpal tunnel syndrome, left upper limb: Secondary | ICD-10-CM | POA: Diagnosis not present

## 2017-10-06 DIAGNOSIS — G5622 Lesion of ulnar nerve, left upper limb: Secondary | ICD-10-CM | POA: Diagnosis not present

## 2017-10-06 DIAGNOSIS — E1140 Type 2 diabetes mellitus with diabetic neuropathy, unspecified: Secondary | ICD-10-CM | POA: Diagnosis not present

## 2017-10-06 DIAGNOSIS — E1142 Type 2 diabetes mellitus with diabetic polyneuropathy: Secondary | ICD-10-CM | POA: Insufficient documentation

## 2017-10-14 DIAGNOSIS — G5602 Carpal tunnel syndrome, left upper limb: Secondary | ICD-10-CM | POA: Diagnosis not present

## 2017-10-14 DIAGNOSIS — G5622 Lesion of ulnar nerve, left upper limb: Secondary | ICD-10-CM | POA: Diagnosis not present

## 2017-10-14 HISTORY — PX: CARPAL TUNNEL RELEASE: SHX101

## 2017-10-17 ENCOUNTER — Telehealth: Payer: Self-pay

## 2017-10-17 ENCOUNTER — Telehealth: Payer: Self-pay | Admitting: Emergency Medicine

## 2017-10-17 NOTE — Telephone Encounter (Signed)
OV made for 10/21/17

## 2017-10-17 NOTE — Telephone Encounter (Signed)
Copied from [place] [phone]. Topic: Quick Communication - Office Called Patient >> Oct 16, 2017  9:31 AM Ramond Craver B wrote: Reason for CRM: per dr g ok to schedule new pt appointment  next 30 min appointment >> Oct 16, 2017  4:58 PM Wynetta Emery, [place] C wrote: Pt is returning call to schedule np apt with Dr. Dionisio Paschal per [place]. Pt would like to come in on Dec 6 at 2:00. Unable to schedule due to provider not accepting pt's. Could office please schedule apt on date and time (showing available)?    Thanks.

## 2017-10-17 NOTE — Telephone Encounter (Signed)
Received fax from [facility], the would like the Pre-Op form to be completed. They notated to please give instructions regarding Aspirin.   Placed on Dr. Cordelia Pen desk

## 2017-10-17 NOTE — Telephone Encounter (Signed)
Message has been sent to correct office [facility]

## 2017-10-17 NOTE — Telephone Encounter (Signed)
Sent to me in error. 

## 2017-10-17 NOTE — Telephone Encounter (Signed)
Ov needed 

## 2017-10-21 ENCOUNTER — Ambulatory Visit (INDEPENDENT_AMBULATORY_CARE_PROVIDER_SITE_OTHER): Payer: Medicare Other | Admitting: Endocrinology

## 2017-10-21 ENCOUNTER — Encounter: Payer: Self-pay | Admitting: Endocrinology

## 2017-10-21 VITALS — BP 130/70 | HR 56 | Ht 65.0 in | Wt 271.2 lb

## 2017-10-21 DIAGNOSIS — E1129 Type 2 diabetes mellitus with other diabetic kidney complication: Secondary | ICD-10-CM

## 2017-10-21 DIAGNOSIS — Z125 Encounter for screening for malignant neoplasm of prostate: Secondary | ICD-10-CM

## 2017-10-21 DIAGNOSIS — I2510 Atherosclerotic heart disease of native coronary artery without angina pectoris: Secondary | ICD-10-CM | POA: Diagnosis not present

## 2017-10-21 DIAGNOSIS — Z23 Encounter for immunization: Secondary | ICD-10-CM | POA: Diagnosis not present

## 2017-10-21 DIAGNOSIS — G5602 Carpal tunnel syndrome, left upper limb: Secondary | ICD-10-CM | POA: Diagnosis not present

## 2017-10-21 DIAGNOSIS — I10 Essential (primary) hypertension: Secondary | ICD-10-CM | POA: Diagnosis not present

## 2017-10-21 DIAGNOSIS — E119 Type 2 diabetes mellitus without complications: Secondary | ICD-10-CM

## 2017-10-21 DIAGNOSIS — D509 Iron deficiency anemia, unspecified: Secondary | ICD-10-CM | POA: Diagnosis not present

## 2017-10-21 LAB — POCT GLYCOSYLATED HEMOGLOBIN (HGB A1C): HEMOGLOBIN A1C: 8.4 % — AB (ref 4.0–5.6)

## 2017-10-21 MED ORDER — INSULIN NPH (HUMAN) (ISOPHANE) 100 UNIT/ML ~~LOC~~ SUSP: 280.0000 [IU] | Freq: Every day | SUBCUTANEOUS | 5 refills | Status: DC

## 2017-10-21 NOTE — Progress Notes (Signed)
Subjective:    Patient ID: Donald [name], male    DOB: [birth year], 72 y.o.   MRN: [id number]  HPI Pt returns for f/u of diabetes mellitus: DM type: Insulin-requiring type 2 Dx'ed: 8315 Complications: polyneuropathy, CAD and renal insufficiency Therapy: insulin since 1995 DKA: never.  Severe hypoglycemia: never.  Pancreatitis: never.  Other: he takes multiple daily injections; he takes human insulin, due to cost; he declines weight-loss surgery; he has severe insulin resistance; he declines pump; He often eats 2 meals per day, so he takes only 2 injections of novolog.  Interval history: he had a steroid injection into left wrist, for CTS, approx 3 weeks ago.  no cbg record, but states cbg's vary from 91-178 (except for the few days after steroid shots).  There is no trend throughout the day.   Past Medical History:  Diagnosis Date  . ANEMIA-IRON DEFICIENCY 07/26/2006  . Anxiety   . ASTHMA 07/26/2006  . Asthma   . Back pain, chronic   . Bell's palsy   . Chronic kidney disease    stage 3 renal failure  . COLONIC POLYPS, HX OF 07/26/2006  . D V T 12/03/2007  . DEPRESSION 03/14/2009  . DIABETES MELLITUS, TYPE II 07/26/2006  . 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 DYSLIPIDEMIA 04/13/2009  . Edema    ankle  . GERD 07/26/2006  . Gout   . Heart murmur   . History of kidney stones   . HYPERTENSION 07/26/2006  . INSOMNIA 08/21/2007  . Neuropathy   . OBSTRUCTIVE SLEEP APNEA 12/03/2007   Use C-PAP  . PERIPHERAL NEUROPATHY 07/26/2006  . Pernicious anemia 11/20/2006  . PULMONARY EMBOLISM 10/05/2007  . Renal insufficiency   . Shortness of breath dyspnea     Past Surgical History:  Procedure Laterality Date  . APPENDECTOMY  1968  . [place], 06/14/2008   Dr. Trenton Gammon at [facility] (06/10)  . CARDIAC CATHETERIZATION    . CARPAL TUNNEL RELEASE Right   . CATARACT EXTRACTION W/PHACO Left 05/30/2014   Procedure: CATARACT EXTRACTION PHACO AND INTRAOCULAR LENS PLACEMENT (IOC);   Surgeon: Estill Cotta, MD;  Location: [facility] ORS;  Service: Ophthalmology;  Laterality: Left;  [name] 01:20 AP% 23.7 CDE 31.86  . EYE SURGERY Left    Catarct Extraction with IOL  . LITHOTRIPSY     X 2  . PARATHYROIDECTOMY Right 04/18/2016   Procedure: PARATHYROIDECTOMY;  Surgeon: [name], MD;  Location: [facility] ORS;  Service: ENT;  Laterality: Right;  . SHOULDER ARTHROSCOPY W/ ROTATOR CUFF REPAIR Right   . TONSILLECTOMY      Social History   Socioeconomic History  . Marital status: Married    Spouse name: Not on file  . Number of children: Not on file  . Years of education: Not on file  . Highest education level: Not on file  Occupational History  . Occupation: Disabled    Employer: [employer]  Social Needs  . Financial resource strain: Not on file  . Food insecurity:    Worry: Not on file    Inability: Not on file  . Transportation needs:    Medical: Not on file    Non-medical: Not on file  Tobacco Use  . Smoking status: Never Smoker  . Smokeless tobacco: Never Used  Substance and Sexual Activity  . Alcohol use: No  . Drug use: No  . Sexual activity: Not on file  Lifestyle  . Physical activity:    Days per week: Not on file  Minutes per session: Not on file  . Stress: Not on file  Relationships  . Social connections:    Talks on phone: Not on file    Gets together: Not on file    Attends religious service: Not on file    Active member of club or organization: Not on file    Attends meetings of clubs or organizations: Not on file    Relationship status: Not on file  . Intimate partner violence:    Fear of current or ex partner: Not on file    Emotionally abused: Not on file    Physically abused: Not on file    Forced sexual activity: Not on file  Other Topics Concern  . 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   Activity is severely limited by medical problems   Diet is "good".    Never a smoker   Alcohol: none          Current Outpatient Medications on File Prior to Visit  Medication Sig Dispense Refill  . allopurinol (ZYLOPRIM) 300 MG tablet TAKE 1 TABLET BY MOUTH DAILY 30 tablet 2  . ALPRAZolam (XANAX) 1 MG tablet TAKE ONE TABLET AT BEDTIME AS NEEDED FORSLEEP 30 tablet 5  . amLODipine (NORVASC) 2.5 MG tablet Take 2.5 mg by mouth daily.    Marland Kitchen aspirin 81 MG chewable tablet Chew 81 mg by mouth daily.    . calcium carbonate (TUMS - DOSED IN MG ELEMENTAL CALCIUM) 500 MG chewable tablet Chew 5 tablets (1,000 mg of elemental calcium total) by mouth 3 (three) times daily with meals. (Patient taking differently: Chew 1,000 mg of elemental calcium by mouth 2 (two) times daily with a meal. ) 100 tablet 0  . colchicine (COLCRYS) 0.6 MG tablet TAKE 1 TABLET BY MOUTH EVERY HOUR UNTIL SYMPTOMS ARE BETTER, MAX OF 6TABS PER DAY. STOP IF DIARRHEA 30 tablet 4  . furosemide (LASIX) 80 MG tablet TAKE ONE TABLET BY MOUTH EVERY DAY 90 tablet 2  . gabapentin (NEURONTIN) 600 MG tablet Take 1 tablet (600 mg total) by mouth 2 (two) times daily. 60 tablet 11  . glucose blood (GE100 BLOOD GLUCOSE TEST) test strip TEST 3-4 TIMES DAILY AS DIRECTED 100 each 5  . insulin regular (NOVOLIN R RELION) 100 units/mL injection 3 times a day (just before each meal) (200-170-260) units 21 vial 11  . losartan (COZAAR) 100 MG tablet TAKE ONE TABLET BY MOUTH AT BEDTIME 90 tablet 3  . lovastatin (MEVACOR) 40 MG tablet Take 2 tablets (80 mg total) by mouth at bedtime. 180 tablet 3  . Naphazoline-Pheniramine (OPCON-A OP) Place 1 drop into both eyes 2 (two) times daily as needed.    . ondansetron (ZOFRAN) 4 MG tablet Take 1 tablet (4 mg total) by mouth every 4 (four) hours as needed for nausea. 20 tablet 0  . Sennosides-Docusate Sodium (STOOL SOFTENER & LAXATIVE PO) Take 2 tablets by mouth at bedtime.    . sildenafil (VIAGRA) 100 MG tablet Take 1 tablet (100 mg total) by mouth daily as needed for erectile  dysfunction. 90 tablet 3   No current facility-administered medications on file prior to visit.     Allergies  Allergen Reactions  . Codeine Other (See Comments)    REACTION: chest pain  . Pioglitazone Other (See Comments)    REACTION to Actos: swelling in ankles    Family History  Problem Relation Age  of Onset  . Cancer Mother        Breast Cancer  . Cancer Sister        Breast Cancer  . Diabetes Father   . Heart disease Father   . Diabetes Paternal Grandfather     BP 130/70 (BP Location: Left Arm)   Pulse (!) 56   Ht 5\' 5"  (1.651 m)   Wt 271 lb 3.2 oz (123 kg)   SpO2 96%   BMI 45.13 kg/m    Review of Systems He denies hypoglycemia, chest pain, and sob.      Objective:   Physical Exam VITAL SIGNS:  See vs page GENERAL: no distress.    LUNGS:  Clear to auscultation HEART:  Regular rate and rhythm without murmurs noted. Normal S1,S2.   Pulses: dorsalis pedis intact bilat.   MSK: no deformity of the feet CV: 2+ bilat leg edema Skin:  no ulcer on the feet, but the skin is dry.  normal color and temp on the feet. Neuro: sensation is intact to touch on the feet  Lab Results  Component Value Date   HGBA1C 8.4 (A) 10/21/2017    Fructosamine=319    Assessment & Plan:  Insulin-requiring type 2 DM, with renal insuff: well-controlled. CTS: steroid injection is affecting a1c, so we are checking fructosamine HTN: well-controlled.  Please continue the same medications.    Patient Instructions  blood tests are requested for you today.  We'll let you know about the results.   Based on the result, I hope to clear you for the surgery.  On the day of surgery, you would skip the insulin for each meal you miss.   check your blood sugar twice a day.  vary the time of day when you check, between before the 3 meals, and at bedtime.  also check if you have symptoms of your blood sugar being too high or too low.  please keep a record of the readings and bring it to your next  appointment here (or you can bring the meter itself).  You can write it on any piece of paper.  please call us sooner if your blood sugar goes below 70, or if you have a lot of readings over 200.  Please come back for a follow-up appointment in 3-4 months, for the diabetes.    Addendum: His surgical risk is low and outweighed by the potential benefit of the surgery.  She is therefore medically cleared

## 2017-10-21 NOTE — Patient Instructions (Addendum)
blood tests are requested for you today.  We'll let you know about the results.   Based on the result, I hope to clear you for the surgery.  On the day of surgery, you would skip the insulin for each meal you miss.   check your blood sugar twice a day.  vary the time of day when you check, between before the 3 meals, and at bedtime.  also check if you have symptoms of your blood sugar being too high or too low.  please keep a record of the readings and bring it to your next appointment here (or you can bring the meter itself).  You can write it on any piece of paper.  please call us sooner if your blood sugar goes below 70, or if you have a lot of readings over 200.  Please come back for a follow-up appointment in 3-4 months, for the diabetes.

## 2017-10-22 LAB — CBC WITH DIFFERENTIAL/PLATELET
BASOS ABS: 0.1 10*3/uL (ref 0.0–0.1)
Basophils Relative: 0.9 % (ref 0.0–3.0)
Eosinophils Absolute: 0.2 10*3/uL (ref 0.0–0.7)
Eosinophils Relative: 2.2 % (ref 0.0–5.0)
HCT: 41.7 % (ref 39.0–52.0)
Hemoglobin: 13.9 g/dL (ref 13.0–17.0)
LYMPHS ABS: 1.3 10*3/uL (ref 0.7–4.0)
Lymphocytes Relative: 16 % (ref 12.0–46.0)
MCHC: 33.2 g/dL (ref 30.0–36.0)
MCV: 88 fl (ref 78.0–100.0)
MONOS PCT: 6.3 % (ref 3.0–12.0)
Monocytes Absolute: 0.5 10*3/uL (ref 0.1–1.0)
NEUTROS ABS: 6.3 10*3/uL (ref 1.4–7.7)
NEUTROS PCT: 74.6 % (ref 43.0–77.0)
PLATELETS: 186 10*3/uL (ref 150.0–400.0)
RBC: 4.74 Mil/uL (ref 4.22–5.81)
RDW: 16.2 % — AB (ref 11.5–15.5)
WBC: 8.4 10*3/uL (ref 4.0–10.5)

## 2017-10-22 LAB — HEPATIC FUNCTION PANEL
ALBUMIN: 4.2 g/dL (ref 3.5–5.2)
ALK PHOS: 87 U/L (ref 39–117)
ALT: 18 U/L (ref 0–53)
AST: 17 U/L (ref 0–37)
BILIRUBIN TOTAL: 1 mg/dL (ref 0.2–1.2)
Bilirubin, Direct: 0.2 mg/dL (ref 0.0–0.3)
Total Protein: 6.7 g/dL (ref 6.0–8.3)

## 2017-10-22 LAB — BASIC METABOLIC PANEL
BUN: 31 mg/dL — ABNORMAL HIGH (ref 6–23)
CALCIUM: 9.4 mg/dL (ref 8.4–10.5)
CO2: 33 meq/L — AB (ref 19–32)
CREATININE: 1.5 mg/dL (ref 0.40–1.50)
Chloride: 100 mEq/L (ref 96–112)
GFR: 49.2 mL/min — AB (ref >60.00)
GLUCOSE: 273 mg/dL — AB (ref 70–99)
Potassium: 4.3 mEq/L (ref 3.5–5.1)
Sodium: 142 mEq/L (ref 135–145)

## 2017-10-22 LAB — IBC PANEL
Iron: 45 ug/dL (ref 42–165)
Saturation Ratios: 12.9 % — ABNORMAL LOW (ref 20.0–50.0)
Transferrin: 249 mg/dL (ref 212.0–360.0)

## 2017-10-22 LAB — LIPID PANEL
CHOLESTEROL: 100 mg/dL (ref 0–200)
HDL: 28.2 mg/dL — ABNORMAL LOW (ref >39.00)
LDL Cholesterol: 34 mg/dL (ref 0–99)
NonHDL: 71.81
Total CHOL/HDL Ratio: 4
Triglycerides: 188 mg/dL — ABNORMAL HIGH (ref 0.0–149.0)
VLDL: 37.6 mg/dL (ref 0.0–40.0)

## 2017-10-22 LAB — TSH: TSH: 3.14 u[IU]/mL (ref 0.35–4.50)

## 2017-10-22 LAB — PSA, MEDICARE: PSA: 0.68 ng/mL (ref 0.10–4.00)

## 2017-10-23 LAB — FRUCTOSAMINE: FRUCTOSAMINE: 319 umol/L — AB (ref 190–270)

## 2017-10-24 ENCOUNTER — Telehealth: Payer: Self-pay | Admitting: Endocrinology

## 2017-10-24 NOTE — Telephone Encounter (Signed)
Pt aware of results and faxed copy to surgical center

## 2017-10-24 NOTE — Telephone Encounter (Signed)
Patient is calling about labs results

## 2017-10-24 NOTE — Telephone Encounter (Signed)
Per [facility] states he is calling to follow up on lab work

## 2017-10-28 DIAGNOSIS — L820 Inflamed seborrheic keratosis: Secondary | ICD-10-CM | POA: Diagnosis not present

## 2017-10-28 DIAGNOSIS — D2262 Melanocytic nevi of left upper limb, including shoulder: Secondary | ICD-10-CM | POA: Diagnosis not present

## 2017-10-28 DIAGNOSIS — D2271 Melanocytic nevi of right lower limb, including hip: Secondary | ICD-10-CM | POA: Diagnosis not present

## 2017-10-28 DIAGNOSIS — L821 Other seborrheic keratosis: Secondary | ICD-10-CM | POA: Diagnosis not present

## 2017-10-28 DIAGNOSIS — L538 Other specified erythematous conditions: Secondary | ICD-10-CM | POA: Diagnosis not present

## 2017-10-28 DIAGNOSIS — D2261 Melanocytic nevi of right upper limb, including shoulder: Secondary | ICD-10-CM | POA: Diagnosis not present

## 2017-10-28 DIAGNOSIS — D0439 Carcinoma in situ of skin of other parts of face: Secondary | ICD-10-CM | POA: Diagnosis not present

## 2017-10-28 DIAGNOSIS — D2272 Melanocytic nevi of left lower limb, including hip: Secondary | ICD-10-CM | POA: Diagnosis not present

## 2017-10-29 DIAGNOSIS — G5602 Carpal tunnel syndrome, left upper limb: Secondary | ICD-10-CM | POA: Diagnosis not present

## 2017-11-10 ENCOUNTER — Other Ambulatory Visit: Payer: Self-pay | Admitting: Endocrinology

## 2017-11-11 ENCOUNTER — Ambulatory Visit: Payer: Medicare Other | Admitting: Endocrinology

## 2017-11-12 IMAGING — US US THYROID
1 series · 14 of 25 positions shown · non-contrast
Comparison: None.

ADDENDUM:
Evaluation of the thyroglossal duct area demonstrates no evidence of
mass or cyst.
CLINICAL DATA: Palpable neck mass.

EXAM:
THYROID ULTRASOUND
TECHNIQUE: Ultrasound examination of the thyroid gland and adjacent soft
tissues was performed. Exam is suboptimal due to body habitus.

[Series 1: us thyroid · 0.09mm/px · 14 of 53 slices shown]
[im 1/53]
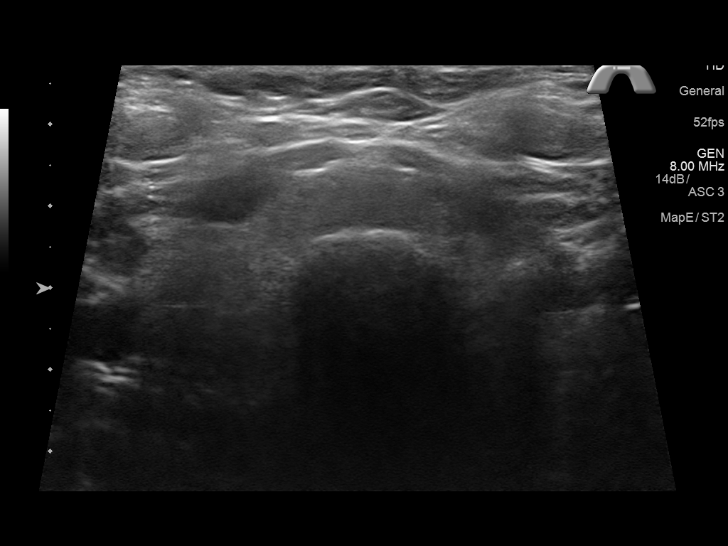
[im 5/53]
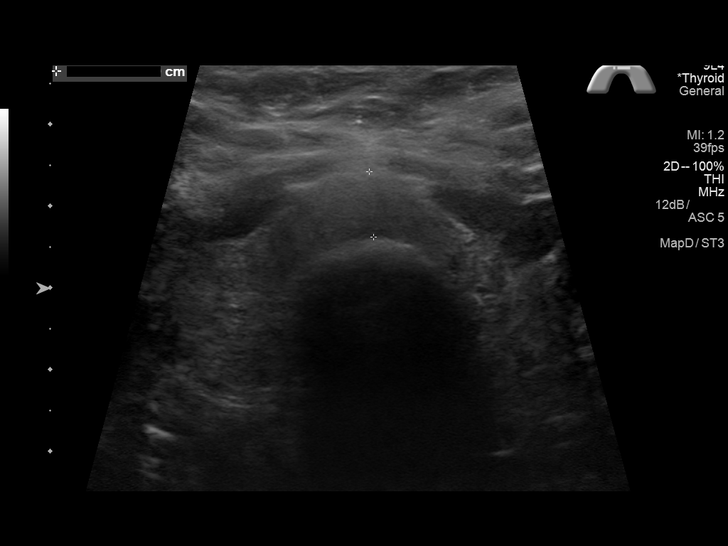
[im 9/53]
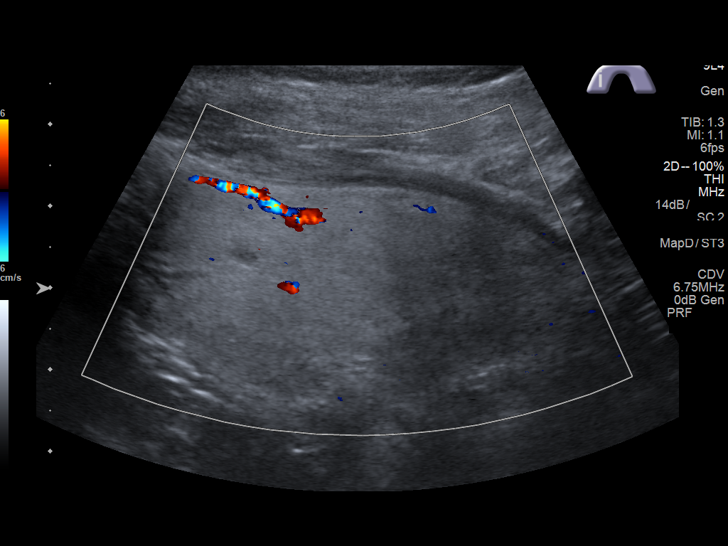
[im 14/53]
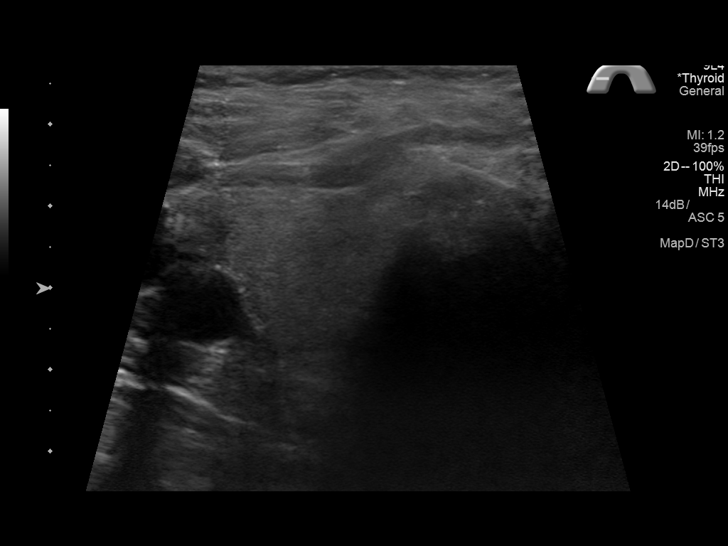
[im 18/53]
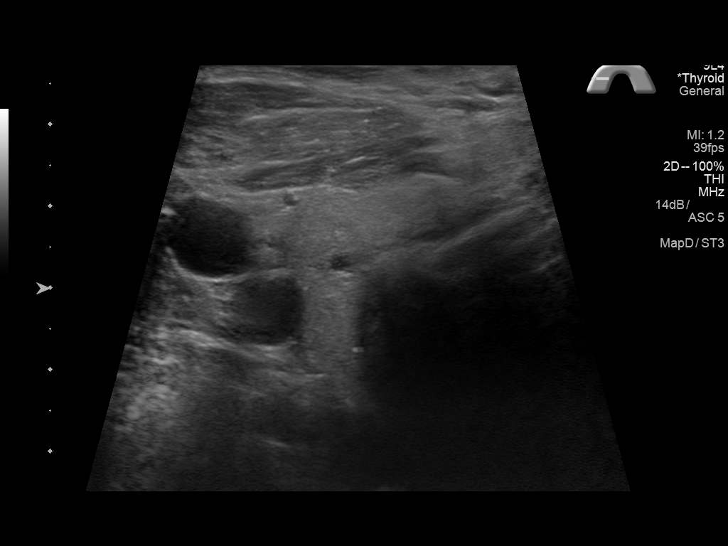
[im 20/53]
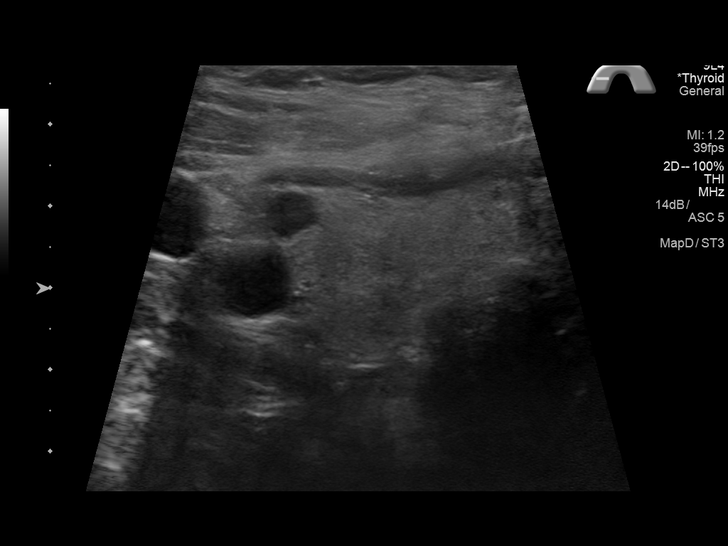
[im 24/53]
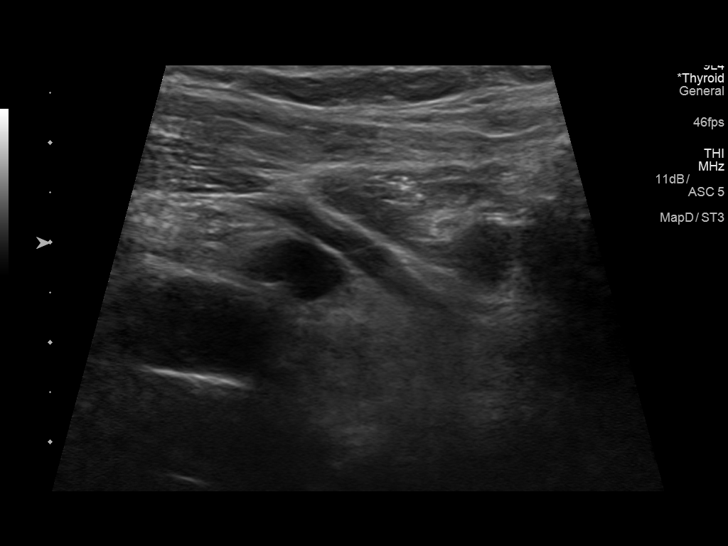
[im 29/53]
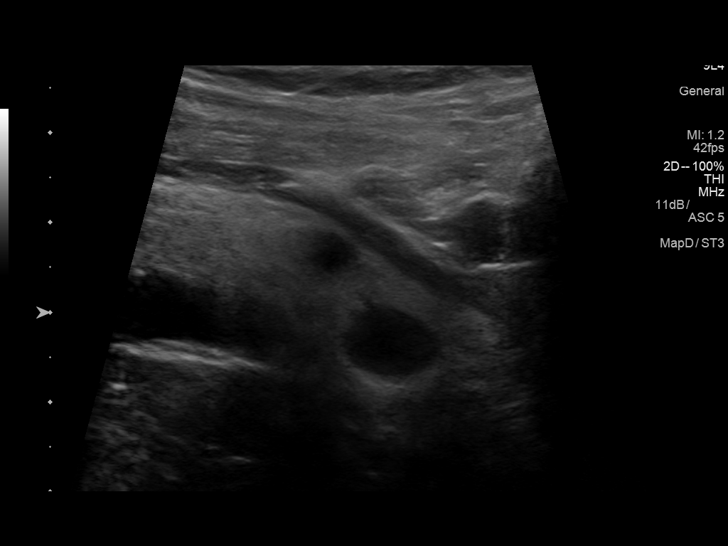
[im 33/53]
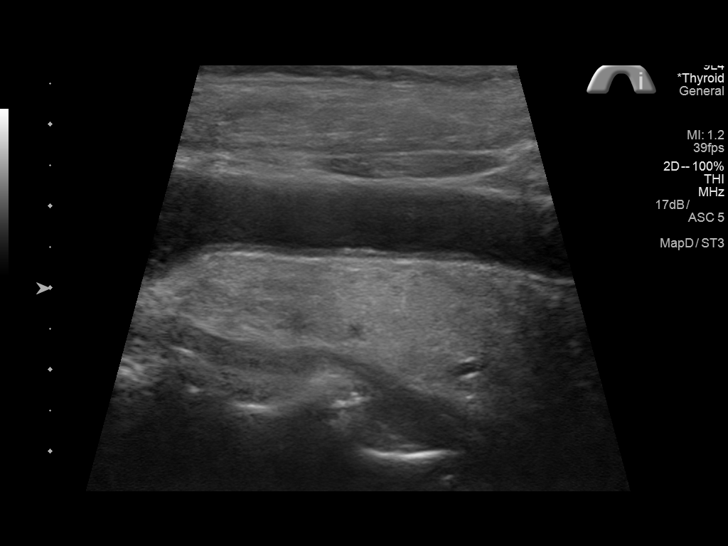
[im 35/53]
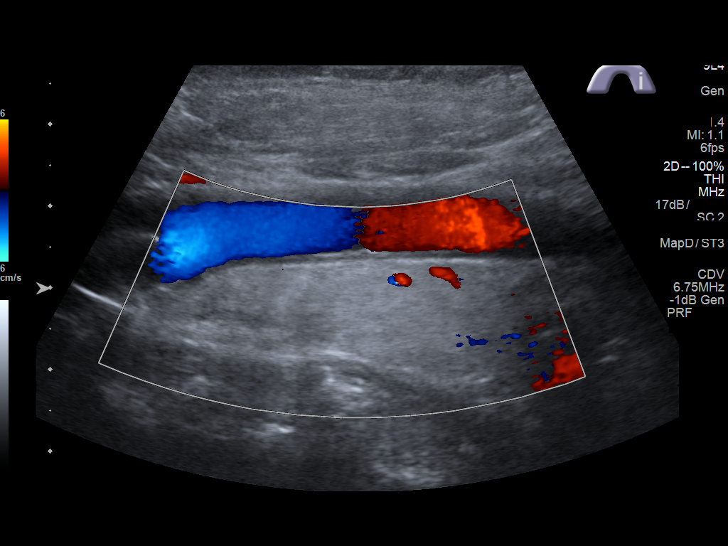
[im 40/53]
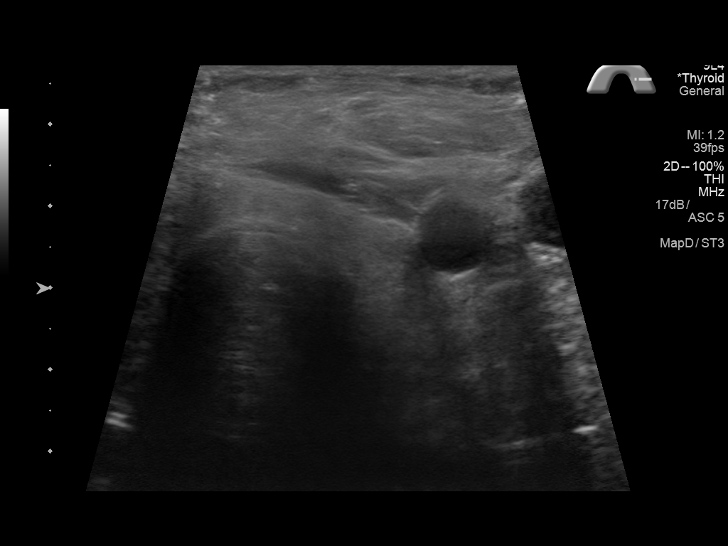
[im 44/53]
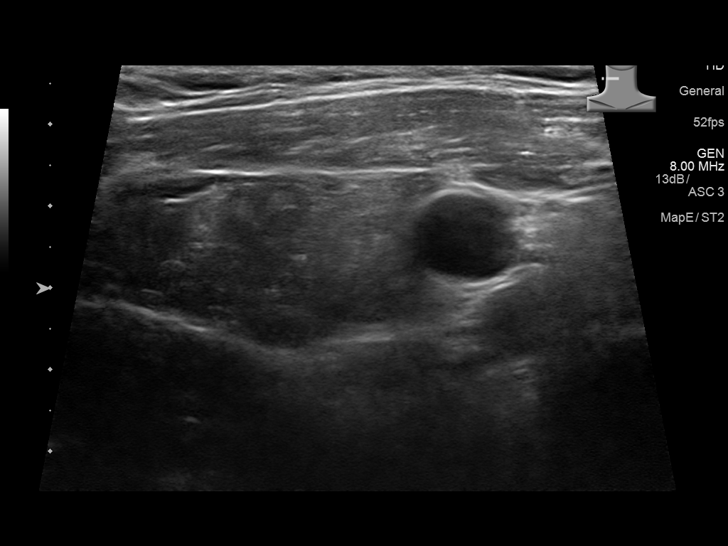
[im 48/53]
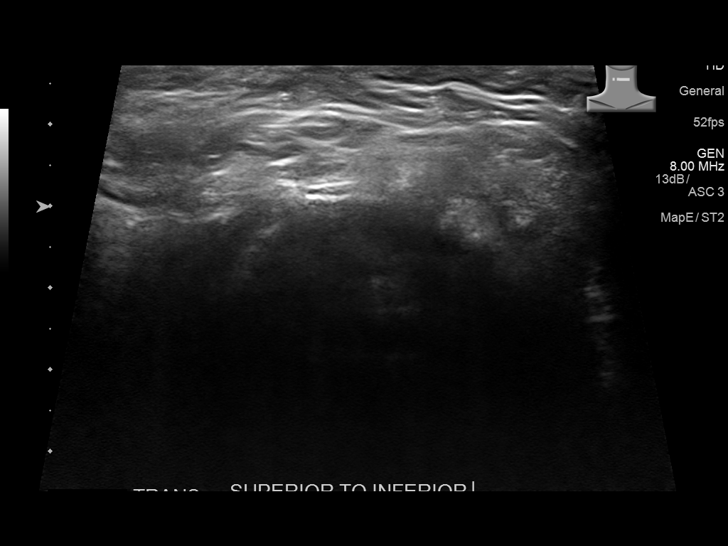
[im 53/53]
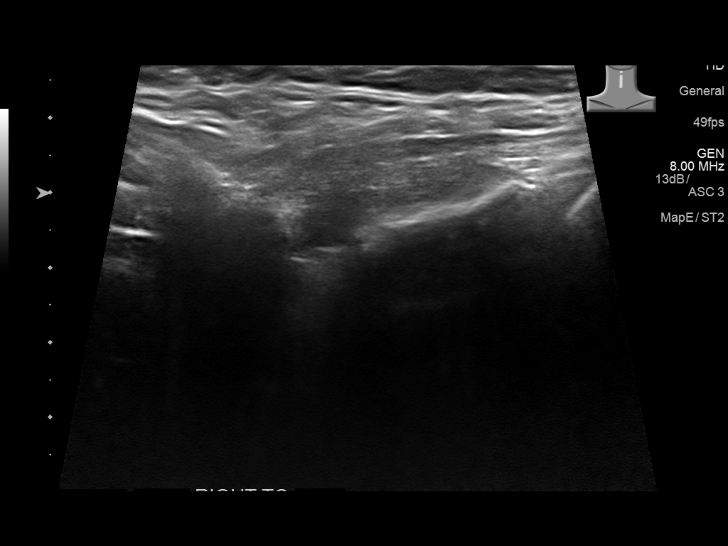

[14 of 25 positions shown; findings below may reference images not displayed]

FINDINGS: Right thyroid lobe

Measurements: 5.2 x 2.1 x 2.0 cm. Two cystic nodules are noted in
lower pole, the largest measuring 1.2 x 1.0 x 1.0 cm.

Left thyroid lobe

Measurements: 4.7 x 1.9 x 1.3 cm.  No nodules visualized.

Isthmus

Thickness: 8 mm.  No nodules visualized.

Lymphadenopathy

None visualized.
IMPRESSION: Two cystic nodules are noted in lower pole of right thyroid lobe,
with the largest measuring 1.2 cm. Findings do not meet current SRU
consensus criteria for biopsy. Follow-up by clinical exam is
recommended. If patient has known risk factors for thyroid
carcinoma, consider follow-up ultrasound in 12 months. If patient is
clinically hyperthyroid, consider nuclear medicine thyroid uptake
and scan.Reference: Management of Thyroid Nodules Detected at US:
Society of Radiologists in Ultrasound Consensus Conference

## 2017-11-25 DIAGNOSIS — M5136 Other intervertebral disc degeneration, lumbar region: Secondary | ICD-10-CM | POA: Diagnosis not present

## 2017-12-08 ENCOUNTER — Other Ambulatory Visit: Payer: Self-pay | Admitting: Endocrinology

## 2017-12-08 NOTE — Telephone Encounter (Signed)
Called pt to inquire further. States he has an appt scheduled 12/19/17 with new PCP. Do you wish to refill until his appt?

## 2017-12-08 NOTE — Telephone Encounter (Signed)
please call patient: Have you chosen a new PCP?

## 2017-12-08 NOTE — Telephone Encounter (Signed)
Please advise if appropriate to refill

## 2017-12-09 NOTE — Telephone Encounter (Signed)
I refilled both x 1

## 2017-12-19 ENCOUNTER — Other Ambulatory Visit: Payer: Self-pay | Admitting: *Deleted

## 2017-12-19 ENCOUNTER — Ambulatory Visit (INDEPENDENT_AMBULATORY_CARE_PROVIDER_SITE_OTHER): Payer: Medicare Other | Admitting: Family Medicine

## 2017-12-19 ENCOUNTER — Encounter: Payer: Self-pay | Admitting: Family Medicine

## 2017-12-19 VITALS — BP 134/50 | HR 61 | Temp 98.2°F | Ht 65.0 in | Wt 272.2 lb

## 2017-12-19 DIAGNOSIS — I10 Essential (primary) hypertension: Secondary | ICD-10-CM

## 2017-12-19 DIAGNOSIS — E785 Hyperlipidemia, unspecified: Secondary | ICD-10-CM

## 2017-12-19 DIAGNOSIS — E1165 Type 2 diabetes mellitus with hyperglycemia: Secondary | ICD-10-CM

## 2017-12-19 DIAGNOSIS — Z86718 Personal history of other venous thrombosis and embolism: Secondary | ICD-10-CM | POA: Diagnosis not present

## 2017-12-19 DIAGNOSIS — K5909 Other constipation: Secondary | ICD-10-CM | POA: Insufficient documentation

## 2017-12-19 DIAGNOSIS — N183 Chronic kidney disease, stage 3 unspecified: Secondary | ICD-10-CM

## 2017-12-19 DIAGNOSIS — E1122 Type 2 diabetes mellitus with diabetic chronic kidney disease: Secondary | ICD-10-CM | POA: Diagnosis not present

## 2017-12-19 DIAGNOSIS — Z86711 Personal history of pulmonary embolism: Secondary | ICD-10-CM

## 2017-12-19 DIAGNOSIS — F5104 Psychophysiologic insomnia: Secondary | ICD-10-CM | POA: Diagnosis not present

## 2017-12-19 DIAGNOSIS — E892 Postprocedural hypoparathyroidism: Secondary | ICD-10-CM

## 2017-12-19 DIAGNOSIS — K5900 Constipation, unspecified: Secondary | ICD-10-CM

## 2017-12-19 DIAGNOSIS — IMO0002 Reserved for concepts with insufficient information to code with codable children: Secondary | ICD-10-CM

## 2017-12-19 DIAGNOSIS — E1121 Type 2 diabetes mellitus with diabetic nephropathy: Secondary | ICD-10-CM | POA: Diagnosis not present

## 2017-12-19 DIAGNOSIS — M1A9XX0 Chronic gout, unspecified, without tophus (tophi): Secondary | ICD-10-CM | POA: Diagnosis not present

## 2017-12-19 DIAGNOSIS — Z794 Long term (current) use of insulin: Secondary | ICD-10-CM

## 2017-12-19 DIAGNOSIS — D509 Iron deficiency anemia, unspecified: Secondary | ICD-10-CM

## 2017-12-19 DIAGNOSIS — Z9889 Other specified postprocedural states: Secondary | ICD-10-CM | POA: Diagnosis not present

## 2017-12-19 DIAGNOSIS — E1142 Type 2 diabetes mellitus with diabetic polyneuropathy: Secondary | ICD-10-CM | POA: Diagnosis not present

## 2017-12-19 DIAGNOSIS — I2510 Atherosclerotic heart disease of native coronary artery without angina pectoris: Secondary | ICD-10-CM

## 2017-12-19 MED ORDER — GABAPENTIN 600 MG PO TABS: 1200.0000 mg | ORAL_TABLET | Freq: Every day | ORAL | 3 refills | Status: DC

## 2017-12-19 MED ORDER — NORTRIPTYLINE HCL 25 MG PO CAPS: 25.0000 mg | ORAL_CAPSULE | Freq: Every day | ORAL | 3 refills | Status: DC

## 2017-12-19 MED ORDER — FUROSEMIDE 80 MG PO TABS: 80.0000 mg | ORAL_TABLET | Freq: Every day | ORAL | 3 refills | Status: DC

## 2017-12-19 MED ORDER — FERROUS SULFATE 325 (65 FE) MG PO TABS: 325.0000 mg | ORAL_TABLET | Freq: Every day | ORAL | 3 refills | Status: DC

## 2017-12-19 MED ORDER — VITAMIN C 500 MG PO TABS: 500.0000 mg | ORAL_TABLET | Freq: Every day | ORAL | Status: DC

## 2017-12-19 NOTE — Assessment & Plan Note (Addendum)
Has not been taking calcium supplements/tums. I asked him to check with endo to see if he needs this.

## 2017-12-19 NOTE — Patient Instructions (Addendum)
Check with Dr Loanne Drilling about calcium supplement.  Try nortriptyline 25mg  nightly for sleep. If effective, we may try to taper off xanax slowly.  Good to meet you today. Call us with questions.  Return as needed or in 3-4 months for medicare wellness visit with Katha Cabal and follow up visit with me.

## 2017-12-19 NOTE — Assessment & Plan Note (Signed)
Sees renal ([facility]).

## 2017-12-19 NOTE — Assessment & Plan Note (Addendum)
Appreciate endo care. On high amounts of insulin. Also on gabapentin which was refilled today.

## 2017-12-19 NOTE — Assessment & Plan Note (Signed)
Longstanding insomnia treated with xanax 0.5mg  nightly. Discussed ideally would want to taper off benzo for sleep - will try amitriptyline 25mg  nightly for next few months. I will continue refilling for now.

## 2017-12-19 NOTE — Progress Notes (Signed)
BP (!) 134/50   Pulse 61   Temp 98.2 F (36.8 C) (Oral)   Ht 5\' 5"  (1.651 m)   Wt 272 lb 4 oz (123.5 kg)   SpO2 94%   BMI 45.30 kg/m    CC: new pt to establish care Subjective:    Patient ID: Donald [name], male    DOB: 23-May-1945, 72 y.o.   MRN: [id number]  HPI: Donald [name] is a 72 y.o. male presenting on 12/19/2017 for Establish Care and Needs RF's, pending   New patient to establish. Husband of Donald [name].   Sees Dr Loanne Drilling for T2DM on NPH 280u daily.  Lab Results  Component Value Date   HGBA1C 8.4 (A) 10/21/2017  Per last endo note: "DM type: Insulin-requiring type 2 Dx'ed: 1517 Complications: polyneuropathy, CAD and renal insufficiency Therapy: insulin since 1995 DKA: never"  He just had steroid shot for cortisone the CTS release.  Receives regular steroid shots into spine for h/o HNP, latest 11/2017 without much benefit. Also takes pain medication. Dr Nelva Bush prescribes hydrocodone 10/325mg .   Request refill of gabapentin 600mg  qhs and furosemide 80mg  daily.  On xanax for sleep 1mg  (takes 1/2 at night).  Gout - on allopurinol 300mg  daily with good effect, colchicine PRN.   HTN - on losartan 100mg  daily and amlodipine 2.5mg  daily. BP at home runs 125-140/50s. No HA, vision changes, CP/tightness, SOB, leg swelling. No dizziness or other low blood pressure symptoms.   Relevant past medical, surgical, family and social history reviewed and updated as indicated. Interim medical history since our last visit reviewed. Allergies and medications reviewed and updated. Outpatient Medications Prior to Visit  Medication Sig Dispense Refill  . allopurinol (ZYLOPRIM) 300 MG tablet TAKE ONE TABLET BY MOUTH DAILY 30 tablet 2  . ALPRAZolam (XANAX) 1 MG tablet TAKE ONE TABLET BY MOUTH AT BEDTIME AS NEEDED FOR SLEEP 30 tablet 0  . amLODipine (NORVASC) 2.5 MG tablet Take 2.5 mg by mouth daily.    Marland Kitchen aspirin 81 MG chewable tablet Chew 81 mg by mouth daily.    .  colchicine (COLCRYS) 0.6 MG tablet TAKE 1 TABLET BY MOUTH EVERY HOUR UNTIL SYMPTOMS ARE BETTER, MAX OF 6TABS PER DAY. STOP IF DIARRHEA 30 tablet 4  . glucose blood (GE100 BLOOD GLUCOSE TEST) test strip TEST 3-4 TIMES DAILY AS DIRECTED 100 each 5  . insulin NPH Human (NOVOLIN N) 100 UNIT/ML injection Inject 2.8 mLs (280 Units total) into the skin at bedtime. 90 mL 5  . insulin regular (NOVOLIN R RELION) 100 units/mL injection 3 times a day (just before each meal) (200-170-260) units 21 vial 11  . losartan (COZAAR) 100 MG tablet TAKE ONE TABLET BY MOUTH AT BEDTIME 90 tablet 3  . lovastatin (MEVACOR) 40 MG tablet Take 2 tablets (80 mg total) by mouth at bedtime. 180 tablet 3  . Naphazoline-Pheniramine (OPCON-A OP) Place 1 drop into both eyes 2 (two) times daily as needed.    . ondansetron (ZOFRAN) 4 MG tablet Take 1 tablet (4 mg total) by mouth every 4 (four) hours as needed for nausea. 20 tablet 0  . Sennosides-Docusate Sodium (STOOL SOFTENER & LAXATIVE PO) Take 2 tablets by mouth at bedtime.    . sildenafil (VIAGRA) 100 MG tablet Take 1 tablet (100 mg total) by mouth daily as needed for erectile dysfunction. 90 tablet 3  . calcium carbonate (TUMS - DOSED IN MG ELEMENTAL CALCIUM) 500 MG chewable tablet Chew 5 tablets (1,000 mg of elemental calcium  total) by mouth 3 (three) times daily with meals. (Patient taking differently: Chew 1,000 mg of elemental calcium by mouth 2 (two) times daily with a meal. ) 100 tablet 0  . furosemide (LASIX) 80 MG tablet TAKE ONE TABLET EVERY DAY 30 tablet 2  . gabapentin (NEURONTIN) 600 MG tablet Take 1 tablet (600 mg total) by mouth 2 (two) times daily. 60 tablet 11  . HYDROcodone-acetaminophen (NORCO) 10-325 MG tablet Take 1 tablet by mouth every 6 (six) hours as needed for moderate pain. 30 tablet 0   No facility-administered medications prior to visit.      Per HPI unless specifically indicated in ROS section below Review of Systems     Objective:    BP (!)  134/50   Pulse 61   Temp 98.2 F (36.8 C) (Oral)   Ht 5\' 5"  (1.651 m)   Wt 272 lb 4 oz (123.5 kg)   SpO2 94%   BMI 45.30 kg/m   Wt Readings from Last 3 Encounters:  12/19/17 272 lb 4 oz (123.5 kg)  10/21/17 271 lb 3.2 oz (123 kg)  08/07/17 274 lb 12.8 oz (124.6 kg)    Physical Exam  Constitutional: He appears well-developed and well-nourished. No distress.  Walks with cane Needs assistance to get on exam table  HENT:  Mouth/Throat: Oropharynx is clear and moist. No oropharyngeal exudate.  Eyes: Pupils are equal, round, and reactive to light. EOM are normal.  Neck: Normal range of motion. Neck supple.  Cardiovascular: Normal rate and regular rhythm.  Murmur (2/6 systolic) heard. Pulmonary/Chest: Effort normal and breath sounds normal. No respiratory distress. He has no wheezes. He has no rales.  Musculoskeletal: He exhibits no edema.  Psychiatric: He has a normal mood and affect.  Nursing note and vitals reviewed.  Results for orders placed or performed in visit on 10/21/17  Fructosamine  Result Value Ref Range   Fructosamine 319 (H) 190 - 270 umol/L  Lipid panel  Result Value Ref Range   Cholesterol 100 0 - 200 mg/dL   Triglycerides 188.0 (H) 0.0 - 149.0 mg/dL   HDL 28.20 (L) >39.00 mg/dL   VLDL 37.6 0.0 - 40.0 mg/dL   LDL Cholesterol 34 0 - 99 mg/dL   Total CHOL/HDL Ratio 4    NonHDL 71.81   PSA, Medicare  Result Value Ref Range   PSA 0.68 0.10 - 4.00 ng/ml  Hepatic function panel  Result Value Ref Range   Total Bilirubin 1.0 0.2 - 1.2 mg/dL   Bilirubin, Direct 0.2 0.0 - 0.3 mg/dL   Alkaline Phosphatase 87 39 - 117 U/L   AST 17 0 - 37 U/L   ALT 18 0 - 53 U/L   Total Protein 6.7 6.0 - 8.3 g/dL   Albumin 4.2 3.5 - 5.2 g/dL  Basic metabolic panel  Result Value Ref Range   Sodium 142 135 - 145 mEq/L   Potassium 4.3 3.5 - 5.1 mEq/L   Chloride 100 96 - 112 mEq/L   CO2 33 (H) 19 - 32 mEq/L   Glucose, Bld 273 (H) 70 - 99 mg/dL   BUN 31 (H) 6 - 23 mg/dL    Creatinine, Ser 1.50 0.40 - 1.50 mg/dL   Calcium 9.4 8.4 - 10.5 mg/dL   GFR 49.20 (L) >60.00 mL/min  IBC panel  Result Value Ref Range   Iron 45 42 - 165 ug/dL   Transferrin 249.0 212.0 - 360.0 mg/dL   Saturation Ratios 12.9 (L) 20.0 - 50.0 %  CBC with Differential/Platelet  Result Value Ref Range   WBC 8.4 4.0 - 10.5 K/uL   RBC 4.74 4.22 - 5.81 Mil/uL   Hemoglobin 13.9 13.0 - 17.0 g/dL   HCT 41.7 39.0 - 52.0 %   MCV 88.0 78.0 - 100.0 fl   MCHC 33.2 30.0 - 36.0 g/dL   RDW 16.2 (H) 11.5 - 15.5 %   Platelets 186.0 150.0 - 400.0 K/uL   Neutrophils Relative % 74.6 43.0 - 77.0 %   Lymphocytes Relative 16.0 12.0 - 46.0 %   Monocytes Relative 6.3 3.0 - 12.0 %   Eosinophils Relative 2.2 0.0 - 5.0 %   Basophils Relative 0.9 0.0 - 3.0 %   Neutro Abs 6.3 1.4 - 7.7 K/uL   Lymphs Abs 1.3 0.7 - 4.0 K/uL   Monocytes Absolute 0.5 0.1 - 1.0 K/uL   Eosinophils Absolute 0.2 0.0 - 0.7 K/uL   Basophils Absolute 0.1 0.0 - 0.1 K/uL  TSH  Result Value Ref Range   TSH 3.14 0.35 - 4.50 uIU/mL  POCT glycosylated hemoglobin (Hb A1C)  Result Value Ref Range   Hemoglobin A1C 8.4 (A) 4.0 - 5.6 %   HbA1c POC (<> result, manual entry)     HbA1c, POC (prediabetic range)     HbA1c, POC (controlled diabetic range)        Assessment & Plan:   Problem List Items Addressed This Visit    Uncontrolled type 2 diabetes mellitus with peripheral neuropathy ([place]) - Primary    Appreciate endo care. On high amounts of insulin. Also on gabapentin which was refilled today.       Relevant Medications   gabapentin (NEURONTIN) 600 MG tablet   nortriptyline (PAMELOR) 25 MG capsule   S/P parathyroidectomy    Has not been taking calcium supplements/tums. I asked him to check with endo to see if he needs this.       Iron deficiency anemia    H/o this. Takes ferrous sulfate daily along with vitamin C.      Relevant Medications   ferrous sulfate 325 (65 FE) MG tablet   History of pulmonary embolus (PE)   History  of deep vein thrombosis (DVT) of lower extremity    H/o this - not currently on blood thinner other than aspirin 81mg .       Essential hypertension    Chronic, stable. Continue current regimen. Discussed his BP is adequate.       Relevant Medications   furosemide (LASIX) 80 MG tablet   Dyslipidemia    On high dose lovastatin 80mg  daily. Continue this.  The ASCVD Risk score Mikey Bussing DC Jr., et al., 2013) failed to calculate for the following reasons:   The valid total cholesterol range is 130 to 320 mg/dL       Diabetes mellitus with diabetic nephropathy, with long-term current use of insulin (HCC)    Continue renal f/u.       Constipation    Continue daily senna stool softener.       CKD stage 3 due to type 2 diabetes mellitus (HCC)    Sees renal ([facility]).      Chronic insomnia    Longstanding insomnia treated with xanax 0.5mg  nightly. Discussed ideally would want to taper off benzo for sleep - will try amitriptyline 25mg  nightly for next few months. I will continue refilling for now.       Chronic gout    Well controlled on allopurinol 300mg  daily with PRN colchicine.  Meds ordered this encounter  Medications  . gabapentin (NEURONTIN) 600 MG tablet    Sig: Take 2 tablets (1,200 mg total) by mouth at bedtime.    Dispense:  180 tablet    Refill:  3  . furosemide (LASIX) 80 MG tablet    Sig: Take 1 tablet (80 mg total) by mouth daily.    Dispense:  90 tablet    Refill:  3  . ferrous sulfate 325 (65 FE) MG tablet    Sig: Take 1 tablet (325 mg total) by mouth daily with breakfast.    Refill:  3  . vitamin C (ASCORBIC ACID) 500 MG tablet    Sig: Take 1 tablet (500 mg total) by mouth daily.  . nortriptyline (PAMELOR) 25 MG capsule    Sig: Take 1 capsule (25 mg total) by mouth at bedtime.    Dispense:  30 capsule    Refill:  3   No orders of the defined types were placed in this encounter.   Follow up plan: Return in about 3 months (around 03/20/2018)  for medicare wellness visit, follow up visit.  Ria Bush, MD

## 2017-12-19 NOTE — Assessment & Plan Note (Signed)
H/o this - not currently on blood thinner other than aspirin 81mg .

## 2017-12-19 NOTE — Assessment & Plan Note (Signed)
Continue renal f/u.

## 2017-12-19 NOTE — Assessment & Plan Note (Signed)
H/o this. Takes ferrous sulfate daily along with vitamin C.

## 2017-12-19 NOTE — Assessment & Plan Note (Signed)
Well controlled on allopurinol 300mg  daily with PRN colchicine.

## 2017-12-19 NOTE — Assessment & Plan Note (Signed)
On high dose lovastatin 80mg  daily. Continue this.  The ASCVD Risk score Donald Bussing DC Jr., Donald al., Donald [name]) failed to calculate for the following reasons:   The valid total cholesterol range is 130 to 320 mg/dL

## 2017-12-19 NOTE — Assessment & Plan Note (Signed)
Chronic, stable. Continue current regimen. Discussed his BP is adequate.

## 2017-12-19 NOTE — Assessment & Plan Note (Signed)
Continue daily senna stool softener.

## 2017-12-30 DIAGNOSIS — M5136 Other intervertebral disc degeneration, lumbar region: Secondary | ICD-10-CM | POA: Diagnosis not present

## 2017-12-30 DIAGNOSIS — E1129 Type 2 diabetes mellitus with other diabetic kidney complication: Secondary | ICD-10-CM | POA: Diagnosis not present

## 2018-01-13 ENCOUNTER — Ambulatory Visit: Payer: Medicare Other

## 2018-01-15 ENCOUNTER — Ambulatory Visit: Payer: Medicare Other

## 2018-01-15 ENCOUNTER — Ambulatory Visit: Payer: Medicare Other | Attending: Chiropractic Medicine

## 2018-01-15 DIAGNOSIS — M6281 Muscle weakness (generalized): Secondary | ICD-10-CM | POA: Diagnosis not present

## 2018-01-15 DIAGNOSIS — M545 Low back pain, unspecified: Secondary | ICD-10-CM

## 2018-01-15 DIAGNOSIS — M62830 Muscle spasm of back: Secondary | ICD-10-CM | POA: Diagnosis not present

## 2018-01-15 DIAGNOSIS — G8929 Other chronic pain: Secondary | ICD-10-CM | POA: Diagnosis not present

## 2018-01-15 NOTE — Therapy (Signed)
[facility] [address], [place], [address] Phone: [phone]   Fax:  [phone]  Physical Therapy Evaluation  Patient Details  Name: Donald [name] MRN: [id number] Date of Birth: Jun 26, 1945 Referring Provider (PT): Ramos MD   Encounter Date: 01/15/2018  PT End of Session - 01/15/18 1811    Visit Number  1    Number of Visits  13    Date for PT Re-Evaluation  02/26/18    Authorization Type  1 / 10 Medicare    PT Start Time  1645    PT Stop Time  1755    PT Time Calculation (min)  70 min    Activity Tolerance  Patient tolerated treatment well    Behavior During Therapy  [facility] for tasks assessed/performed       Past Medical History:  Diagnosis Date  . ANEMIA-IRON DEFICIENCY 07/26/2006  . Anxiety   . ASTHMA 07/26/2006  . Asthma   . Back pain, chronic   . Bell's palsy   . Chronic kidney disease    stage 3 renal failure  . COLONIC POLYPS, HX OF 07/26/2006  . DEPRESSION 03/14/2009  . DIABETES MELLITUS, TYPE II 07/26/2006  . [place] DISEASE, LUMBAR 10/05/2007  . DVT 12/03/2007  . DYSLIPIDEMIA 04/13/2009  . Edema    ankle  . GERD 07/26/2006  . Gout   . Heart murmur   . History of kidney stones   . HYPERTENSION 07/26/2006  . INSOMNIA 08/21/2007  . Neuropathy   . OBSTRUCTIVE SLEEP APNEA 12/03/2007   Use C-PAP  . PERIPHERAL NEUROPATHY 07/26/2006  . Pernicious anemia 11/20/2006  . PULMONARY EMBOLISM 10/05/2007  . Renal insufficiency   . Shortness of breath dyspnea     Past Surgical History:  Procedure Laterality Date  . APPENDECTOMY  1968  . [place], 06/14/2008   Dr. Trenton Gammon at [facility] (06/10)  . CARDIAC CATHETERIZATION    . CARPAL TUNNEL RELEASE Right 10/2017  . CATARACT EXTRACTION W/PHACO Left 05/30/2014   Procedure: CATARACT EXTRACTION PHACO AND INTRAOCULAR LENS PLACEMENT (IOC);  Surgeon: Estill Cotta, MD;  Location: [facility] ORS;  Service: Ophthalmology;  Laterality: Left;  [name]  01:20 AP% 23.7 CDE 31.86  . LITHOTRIPSY     X 2  . PARATHYROIDECTOMY Right 04/18/2016   PARATHYROIDECTOMY for cyst [name], MD)  . SHOULDER ARTHROSCOPY W/ ROTATOR CUFF REPAIR Right   . TONSILLECTOMY      There were no vitals filed for this visit.   Subjective Assessment - 01/15/18 1711    Subjective  Patient reports increased B LBP with intermittent radiating symptoms down B LE (more on L versus R). Patient states increased pain with prolonged ambulation, standing, and bending/lifting items from the floor. Patient reports he does not sleep well and requires medication to sleep states he often goes to bed ~130am. Patient reports he would like to feel better so he can walk around the walking track and play with his grandson. Patient states he was told of having 2 herniations in his back and a high level of arthritis. Patient reports difficulty with shopping secondary to prolonged walking.     Pertinent History  Previous lumbar surgery (unknown type states possible fusion), multiple lumbar steroid injects, currently taking pain medication    Limitations  Sitting;Lifting;Standing;Walking    How long can you walk comfortably?  20-41min    Patient Stated Goals  To return to walking, playing with grandson  Currently in Pain?  Yes    Pain Score  6    best: 3/10; worst: 10/10   Pain Location  Back    Pain Orientation  Lower    Pain Descriptors / Indicators  Aching;Stabbing;Shooting    Pain Type  Chronic pain    Pain Radiating Towards  radiaiting (shooting down to the feet)     Pain Onset  More than a month ago    Pain Frequency  Constant         [facility] PT Assessment - 01/15/18 1705      Assessment   Medical Diagnosis  LBP    Referring Provider (PT)  Ramos MD    Onset Date/Surgical Date  01/15/07    Hand Dominance  Right    Next MD Visit  unknown    Prior Therapy  no       Balance Screen   Has the patient fallen in the past 6 months  No    Has the patient had a decrease  in activity level because of a fear of falling?   Yes    Is the patient reluctant to leave their home because of a fear of falling?   No      Home Social worker  Private residence    Living Arrangements  Spouse/significant other    Available Help at Discharge  Family    Type of [place] Access  Level entry    [place]uth - single point;Walker - 2 wheels      Prior Function   Level of Independence  Independent    Vocation  Retired    [employer]  N/A    Leisure  Walking, watching basketball,       Cognition   Overall Cognitive Status  Within Functional Limits for tasks assessed      Observation/Other Assessments   Observations  leaned back sititng posture      Sensation   Light Touch  Appears Intact      Functional Tests   Functional tests  Other      Other:   Other/ Comments  Lifting items from floor: Increased lumbar guarding with performance      Posture/Postural Control   Posture Comments  increased lumbar ext with performance      ROM / Strength   AROM / PROM / Strength  AROM;Strength      AROM   AROM Assessment Site  Lumbar    Lumbar Flexion  WNL   Pain   Lumbar Extension  90% limited   pain   Lumbar - Right Side Bend  20% limited    Lumbar - Left Side Bend  33% limited    pain   Lumbar - Right Rotation  25% limited    Lumbar - Left Rotation  25% limited      Strength   Strength Assessment Site  Lumbar;Hip    Right/Left Hip  Right;Left    Right Hip Flexion  5/5    Right Hip External Rotation   4-/5    Right Hip Internal Rotation  4+/5    Right Hip ABduction  4/5    Left Hip Flexion  5/5    Left Hip External Rotation  4/5    Left Hip Internal Rotation  4+/5    Left Hip ABduction  4-/5      Palpation  Palpation comment  TTP: multifidus, QL      Special Tests    Special Tests  Lumbar    Lumbar Tests  FABER test;Slump Test;Straight Leg Raise      FABER test    findings  Negative      Slump test   Findings  Positive    Side  Right      Straight Leg Raise   Findings  Negative      Ambulation/Gait   Gait Comments  ER, wide BOS, decreased stride length       Objective measurements completed on examination: See above findings.    TREATMENT Therapeutic Exercise: Bridges -- x 5 Educated on performing walking program at home  -- x5 min  Patient demonstrates no increase in pain at the end of the session     PT Education - 01/15/18 1811    Education Details  HEP: prolonged ambulation 81min; POC    Person(s) Educated  Patient    Methods  Explanation;Demonstration    Comprehension  Verbalized understanding;Returned demonstration          PT Long Term Goals - 01/15/18 1824      PT LONG TERM GOAL #1   Title  Patient will be able to walk >1 hour before requiring a sitting rest break to better be able to purchase groceries.    Baseline  13min before requiring sitting    Time  6    Period  Weeks    Status  New    Target Date  02/26/18      PT LONG TERM GOAL #2   Title  Patient will be independent with HEP to continue benefits of therapy after discharge.     Baseline  Dependent with HEP    Time  6    Period  Weeks    Status  New    Target Date  02/26/18      PT LONG TERM GOAL #3   Title  Patient will be able to sleep throughout the night waking up <2 times to improve immunoresponse and decrease pain.    Baseline  wakes up >3 times each night    Time  6    Period  Weeks    Status  New    Target Date  02/26/18      PT LONG TERM GOAL #4   Title  Patient will have a worst pain of 3/10 in the past week to better be able to perform ADLs with less pain.    Baseline  10/10 pain    Time  6    Period  Weeks    Status  New    Target Date  02/26/18             Plan - 01/15/18 1812    Clinical Impression Statement  Patient is a pleasant 73 yo right hand dominant male presenting with B LBP with intermittent radiating  symptoms down B LEs. Patient demonstrates lumbar dysfunction as indicated by poor AROM, positive SLUMP and MMT. Patient also demosntrates increased fear with movement most notably bending inhibiting movement ability. Patient will benefit from further skilled therapy to return to prior level of function.      History and Personal Factors relevant to plan of care:  herniations, DDD, Chronicity of pain    Clinical Presentation  Evolving    Clinical Presentation due to:  Inpridictability of pain    Clinical Decision Making  Moderate    Rehab Potential  Fair    Clinical Impairments Affecting Rehab Potential  (+) highly motivated (-) persistent pain    PT Frequency  2x / week    PT Duration  6 weeks    PT Treatment/Interventions  Electrical Stimulation;Iontophoresis 4mg /ml Dexamethasone;Moist Heat;Traction;Ultrasound;Cryotherapy;Therapeutic exercise;Therapeutic activities;Balance training;Patient/family education;Neuromuscular re-education;Manual techniques;Dry needling;Scar mobilization;Passive range of motion;Joint Manipulations    PT Next Visit Plan  progress strengthening, PNE    PT Home Exercise Plan  see education section    Consulted and Agree with Plan of Care  Patient       Patient will benefit from skilled therapeutic intervention in order to improve the following deficits and impairments:  Abnormal gait, Pain, Increased fascial restricitons, Increased muscle spasms, Decreased strength, Decreased range of motion, Decreased endurance, Hypomobility, Decreased balance, Decreased activity tolerance, Difficulty walking, Obesity  Visit Diagnosis: Chronic bilateral low back pain, unspecified whether sciatica present  Muscle weakness (generalized)  Muscle spasm of back     Problem List Patient Active Problem List   Diagnosis Date Noted  . Diabetes mellitus with diabetic nephropathy, with long-term current use of insulin ([place]) 12/19/2017  . Constipation 12/19/2017  . Chronic gout  12/19/2017  . PAD (peripheral artery disease) ([place]) 05/07/2017  . Upper airway cough syndrome 08/21/2016  . S/P parathyroidectomy 04/18/2016  . Uncontrolled type 2 diabetes mellitus with peripheral neuropathy ([place]) 03/12/2016  . ECG abnormal 02/28/2016  . Back pain 10/09/2015  . Loss of hearing 05/30/2015  . CKD stage 3 due to type 2 diabetes mellitus ([place]) 01/27/2014  . Pain in joint, ankle and foot 10/08/2013  . Obesity, morbid, BMI 40.0-49.9 ([place]) 03/08/2013  . Impotence of organic origin 03/11/2011  . CAD (coronary artery disease) 11/19/2010  . URINARY CALCULUS 03/26/2010  . HEMATURIA UNSPECIFIED 02/27/2010  . Dyslipidemia 04/13/2009  . ALLERGIC RHINITIS 04/13/2009  . DEPRESSION 03/14/2009  . PROTEINURIA, MILD 09/16/2008  . History of deep vein thrombosis (DVT) of lower extremity 12/03/2007  . OSA (obstructive sleep apnea) 12/03/2007  . History of pulmonary embolus (PE) 10/05/2007  . [place] DISEASE, LUMBAR 10/05/2007  . FOOT PAIN, LEFT 08/21/2007  . Chronic insomnia 08/21/2007  . PERNICIOUS ANEMIA 11/20/2006  . Iron deficiency anemia 07/26/2006  . Essential hypertension 07/26/2006  . Asthmatic bronchitis 07/26/2006  . GERD 07/26/2006  . COLONIC POLYPS, HX OF 07/26/2006    Blythe Stanford, PT DPT 01/15/2018, 6:33 PM  [facility] [address] [place], [address] Phone: [phone]   Fax:  [phone]  Name: DWYNE HASEGAWA MRN: [id number] Date of Birth: Oct 15, 1945

## 2018-01-20 ENCOUNTER — Ambulatory Visit: Payer: Medicare Other

## 2018-01-20 DIAGNOSIS — G8929 Other chronic pain: Secondary | ICD-10-CM | POA: Diagnosis not present

## 2018-01-20 DIAGNOSIS — M6281 Muscle weakness (generalized): Secondary | ICD-10-CM | POA: Diagnosis not present

## 2018-01-20 DIAGNOSIS — M109 Gout, unspecified: Secondary | ICD-10-CM | POA: Diagnosis not present

## 2018-01-20 DIAGNOSIS — M545 Low back pain, unspecified: Secondary | ICD-10-CM

## 2018-01-20 DIAGNOSIS — M62830 Muscle spasm of back: Secondary | ICD-10-CM | POA: Diagnosis not present

## 2018-01-20 DIAGNOSIS — N189 Chronic kidney disease, unspecified: Secondary | ICD-10-CM | POA: Diagnosis not present

## 2018-01-20 DIAGNOSIS — N183 Chronic kidney disease, stage 3 (moderate): Secondary | ICD-10-CM | POA: Diagnosis not present

## 2018-01-20 NOTE — Therapy (Signed)
[facility] [address] [place], [address] Phone: [phone]   Fax:  [phone]  Physical Therapy Treatment  Patient Details  Name: Donald [name] MRN: [id number] Date of Birth: 03-Dec-1945 Referring Provider (PT): Ramos MD   Encounter Date: 01/20/2018  PT End of Session - 01/20/18 1244    Visit Number  2    Number of Visits  13    Date for PT Re-Evaluation  02/26/18    Authorization Type  2 / 10 Medicare    PT Start Time  1050    PT Stop Time  1145    PT Time Calculation (min)  55 min    Activity Tolerance  Patient tolerated treatment well    Behavior During Therapy  [facility] for tasks assessed/performed       Past Medical History:  Diagnosis Date  . ANEMIA-IRON DEFICIENCY 07/26/2006  . Anxiety   . ASTHMA 07/26/2006  . Asthma   . Back pain, chronic   . Bell's palsy   . Chronic kidney disease    stage 3 renal failure  . COLONIC POLYPS, HX OF 07/26/2006  . DEPRESSION 03/14/2009  . DIABETES MELLITUS, TYPE II 07/26/2006  . [place] DISEASE, LUMBAR 10/05/2007  . DVT 12/03/2007  . DYSLIPIDEMIA 04/13/2009  . Edema    ankle  . GERD 07/26/2006  . Gout   . Heart murmur   . History of kidney stones   . HYPERTENSION 07/26/2006  . INSOMNIA 08/21/2007  . Neuropathy   . OBSTRUCTIVE SLEEP APNEA 12/03/2007   Use C-PAP  . PERIPHERAL NEUROPATHY 07/26/2006  . Pernicious anemia 11/20/2006  . PULMONARY EMBOLISM 10/05/2007  . Renal insufficiency   . Shortness of breath dyspnea     Past Surgical History:  Procedure Laterality Date  . APPENDECTOMY  1968  . [place], 06/14/2008   Dr. Trenton Gammon at [facility] (06/10)  . CARDIAC CATHETERIZATION    . CARPAL TUNNEL RELEASE Right 10/2017  . CATARACT EXTRACTION W/PHACO Left 05/30/2014   Procedure: CATARACT EXTRACTION PHACO AND INTRAOCULAR LENS PLACEMENT (IOC);  Surgeon: Estill Cotta, MD;  Location: [facility] ORS;  Service: Ophthalmology;  Laterality: Left;  [name]  01:20 AP% 23.7 CDE 31.86  . LITHOTRIPSY     X 2  . PARATHYROIDECTOMY Right 04/18/2016   PARATHYROIDECTOMY for cyst [name], MD)  . SHOULDER ARTHROSCOPY W/ ROTATOR CUFF REPAIR Right   . TONSILLECTOMY      There were no vitals filed for this visit.  Subjective Assessment - 01/20/18 1242    Subjective  Patient reports he has been performing his ambulation at home and states things have been going "pretty good". Patient reports no increase in pain since the previous session.     Pertinent History  Previous lumbar surgery (unknown type states possible fusion), multiple lumbar steroid injects, currently taking pain medication    Limitations  Sitting;Lifting;Standing;Walking    How long can you walk comfortably?  20-34min    Patient Stated Goals  To return to walking, playing with grandson    Currently in Pain?  Yes    Pain Score  6     Pain Location  Back    Pain Orientation  Lower    Pain Descriptors / Indicators  Aching;Stabbing    Pain Type  Chronic pain    Pain Onset  More than a month ago    Pain Frequency  Constant       TREATMENT  Therapeutic  Exercise Sit to stands -- 2 x 15 Hip abduction in standing with UE support -- x 20 Hip extension in standing with UE support -- x 20 Step ups onto 4" step -- B 2 x 10    Neuromuscular Education PNE Intro (General Pain Knowledge): Patient introduced to the topic of pain neuroscience education and improving knowledge of how pain works to promote improved recovery and rehabilitation. Current knowledge and understanding of patient on pain related topics was explored to create a baseline.    Sensitivity of Nerves: Patient educated on the concept of the nervous system as the body's alarm system and the role of nociception to warn the body of danger.   Pain comes from the brain: Patient was educated on the concept of pain as an output of the brain, including nociception versus pain, inhibition and facilitation, and threat level  using metaphors to promote deep learning    Patient demonstrates no increase in pain at the end of the session      PT Education - 01/20/18 1243    Education Details  HEP: hip abduction/extension in standing, squats with chair, step ups    Person(s) Educated  Patient    Methods  Explanation;Demonstration    Comprehension  Verbalized understanding;Returned demonstration          PT Long Term Goals - 01/15/18 1824      PT LONG TERM GOAL #1   Title  Patient will be able to walk >1 hour before requiring a sitting rest break to better be able to purchase groceries.    Baseline  87min before requiring sitting    Time  6    Period  Weeks    Status  New    Target Date  02/26/18      PT LONG TERM GOAL #2   Title  Patient will be independent with HEP to continue benefits of therapy after discharge.     Baseline  Dependent with HEP    Time  6    Period  Weeks    Status  New    Target Date  02/26/18      PT LONG TERM GOAL #3   Title  Patient will be able to sleep throughout the night waking up <2 times to improve immunoresponse and decrease pain.    Baseline  wakes up >3 times each night    Time  6    Period  Weeks    Status  New    Target Date  02/26/18      PT LONG TERM GOAL #4   Title  Patient will have a worst pain of 3/10 in the past week to better be able to perform ADLs with less pain.    Baseline  10/10 pain    Time  6    Period  Weeks    Status  New    Target Date  02/26/18            Plan - 01/20/18 1245    Clinical Impression Statement  Patient demonstrates increased fatigue with performing exercises in standing today requiring sitting rest breaks after each exercise. Patient able to perform movements without any steady increase in pain. Patient continues to have increased pain and decreased lumbar mobility and patient will benefit from further skilled therapy to return to prior level of function.     Rehab Potential  Fair    Clinical Impairments  Affecting Rehab Potential  (+) highly motivated (-) persistent pain  PT Frequency  2x / week    PT Duration  6 weeks    PT Treatment/Interventions  Electrical Stimulation;Iontophoresis 4mg /ml Dexamethasone;Moist Heat;Traction;Ultrasound;Cryotherapy;Therapeutic exercise;Therapeutic activities;Balance training;Patient/family education;Neuromuscular re-education;Manual techniques;Dry needling;Scar mobilization;Passive range of motion;Joint Manipulations    PT Next Visit Plan  progress strengthening, PNE    PT Home Exercise Plan  see education section    Consulted and Agree with Plan of Care  Patient       Patient will benefit from skilled therapeutic intervention in order to improve the following deficits and impairments:  Abnormal gait, Pain, Increased fascial restricitons, Increased muscle spasms, Decreased strength, Decreased range of motion, Decreased endurance, Hypomobility, Decreased balance, Decreased activity tolerance, Difficulty walking, Obesity  Visit Diagnosis: Chronic bilateral low back pain, unspecified whether sciatica present  Muscle weakness (generalized)  Muscle spasm of back     Problem List Patient Active Problem List   Diagnosis Date Noted  . Diabetes mellitus with diabetic nephropathy, with long-term current use of insulin ([place]) 12/19/2017  . Constipation 12/19/2017  . Chronic gout 12/19/2017  . PAD (peripheral artery disease) ([place]) 05/07/2017  . Upper airway cough syndrome 08/21/2016  . S/P parathyroidectomy 04/18/2016  . Uncontrolled type 2 diabetes mellitus with peripheral neuropathy ([place]) 03/12/2016  . ECG abnormal 02/28/2016  . Back pain 10/09/2015  . Loss of hearing 05/30/2015  . CKD stage 3 due to type 2 diabetes mellitus ([place]) 01/27/2014  . Pain in joint, ankle and foot 10/08/2013  . Obesity, morbid, BMI 40.0-49.9 ([place]) 03/08/2013  . Impotence of organic origin 03/11/2011  . CAD (coronary artery disease) 11/19/2010  . URINARY CALCULUS 03/26/2010   . HEMATURIA UNSPECIFIED 02/27/2010  . Dyslipidemia 04/13/2009  . ALLERGIC RHINITIS 04/13/2009  . DEPRESSION 03/14/2009  . PROTEINURIA, MILD 09/16/2008  . History of deep vein thrombosis (DVT) of lower extremity 12/03/2007  . OSA (obstructive sleep apnea) 12/03/2007  . History of pulmonary embolus (PE) 10/05/2007  . [place] DISEASE, LUMBAR 10/05/2007  . FOOT PAIN, LEFT 08/21/2007  . Chronic insomnia 08/21/2007  . PERNICIOUS ANEMIA 11/20/2006  . Iron deficiency anemia 07/26/2006  . Essential hypertension 07/26/2006  . Asthmatic bronchitis 07/26/2006  . GERD 07/26/2006  . COLONIC POLYPS, HX OF 07/26/2006    Blythe Stanford, PT DPT 01/20/2018, 12:48 PM  [facility] [address], [place], [address] Phone: [phone]   Fax:  [phone]  Name: Donald [name] MRN: [id number] Date of Birth: 1945-02-18

## 2018-01-22 DIAGNOSIS — D351 Benign neoplasm of parathyroid gland: Secondary | ICD-10-CM | POA: Diagnosis not present

## 2018-01-22 DIAGNOSIS — Z6841 Body Mass Index (BMI) 40.0 and over, adult: Secondary | ICD-10-CM | POA: Diagnosis not present

## 2018-01-22 DIAGNOSIS — N2581 Secondary hyperparathyroidism of renal origin: Secondary | ICD-10-CM | POA: Diagnosis not present

## 2018-01-22 DIAGNOSIS — E1122 Type 2 diabetes mellitus with diabetic chronic kidney disease: Secondary | ICD-10-CM | POA: Diagnosis not present

## 2018-01-22 DIAGNOSIS — D631 Anemia in chronic kidney disease: Secondary | ICD-10-CM | POA: Diagnosis not present

## 2018-01-22 DIAGNOSIS — G4733 Obstructive sleep apnea (adult) (pediatric): Secondary | ICD-10-CM | POA: Diagnosis not present

## 2018-01-22 DIAGNOSIS — Z9989 Dependence on other enabling machines and devices: Secondary | ICD-10-CM | POA: Diagnosis not present

## 2018-01-22 DIAGNOSIS — N183 Chronic kidney disease, stage 3 (moderate): Secondary | ICD-10-CM | POA: Diagnosis not present

## 2018-01-22 DIAGNOSIS — I129 Hypertensive chronic kidney disease with stage 1 through stage 4 chronic kidney disease, or unspecified chronic kidney disease: Secondary | ICD-10-CM | POA: Diagnosis not present

## 2018-01-22 DIAGNOSIS — M109 Gout, unspecified: Secondary | ICD-10-CM | POA: Diagnosis not present

## 2018-01-28 ENCOUNTER — Ambulatory Visit: Payer: Medicare Other | Admitting: Endocrinology

## 2018-01-28 ENCOUNTER — Ambulatory Visit: Payer: Medicare Other

## 2018-01-29 ENCOUNTER — Ambulatory Visit: Payer: Medicare Other

## 2018-01-29 DIAGNOSIS — M6281 Muscle weakness (generalized): Secondary | ICD-10-CM | POA: Diagnosis not present

## 2018-01-29 DIAGNOSIS — M545 Low back pain, unspecified: Secondary | ICD-10-CM

## 2018-01-29 DIAGNOSIS — M62830 Muscle spasm of back: Secondary | ICD-10-CM

## 2018-01-29 DIAGNOSIS — G8929 Other chronic pain: Secondary | ICD-10-CM

## 2018-01-29 NOTE — Therapy (Signed)
[facility] [address], [place], [address] Phone: [phone]   Fax:  [phone]  Physical Therapy Treatment  Patient Details  Name: Donald [name] MRN: [id number] Date of Birth: [birth year] Referring Provider (PT): Ramos MD   Encounter Date: 01/29/2018  PT End of Session - 01/29/18 1709    Visit Number  3    Number of Visits  13    Date for PT Re-Evaluation  02/26/18    Authorization Type  3 / 10 Medicare    PT Start Time  1600    PT Stop Time  0630    PT Time Calculation (min)  45 min    Activity Tolerance  Patient tolerated treatment well    Behavior During Therapy  [facility] for tasks assessed/performed       Past Medical History:  Diagnosis Date  . ANEMIA-IRON DEFICIENCY 07/26/2006  . Anxiety   . ASTHMA 07/26/2006  . Asthma   . Back pain, chronic   . Bell's palsy   . Chronic kidney disease    stage 3 renal failure  . COLONIC POLYPS, HX OF 07/26/2006  . DEPRESSION 03/14/2009  . DIABETES MELLITUS, TYPE II 07/26/2006  . [place] DISEASE, LUMBAR 10/05/2007  . DVT 12/03/2007  . DYSLIPIDEMIA 04/13/2009  . Edema    ankle  . GERD 07/26/2006  . Gout   . Heart murmur   . History of kidney stones   . HYPERTENSION 07/26/2006  . INSOMNIA 08/21/2007  . Neuropathy   . OBSTRUCTIVE SLEEP APNEA 12/03/2007   Use C-PAP  . PERIPHERAL NEUROPATHY 07/26/2006  . Pernicious anemia 11/20/2006  . PULMONARY EMBOLISM 10/05/2007  . Renal insufficiency   . Shortness of breath dyspnea     Past Surgical History:  Procedure Laterality Date  . APPENDECTOMY  1968  . [place], 06/14/2008   Dr. Trenton Gammon at [facility] (06/10)  . CARDIAC CATHETERIZATION    . CARPAL TUNNEL RELEASE Right 10/2017  . CATARACT EXTRACTION W/PHACO Left 05/30/2014   Procedure: CATARACT EXTRACTION PHACO AND INTRAOCULAR LENS PLACEMENT (IOC);  Surgeon: Estill Cotta, MD;  Location: [facility] ORS;  Service: Ophthalmology;  Laterality: Left;  [name]  01:20 AP% 23.7 CDE 31.86  . LITHOTRIPSY     X 2  . PARATHYROIDECTOMY Right 04/18/2016   PARATHYROIDECTOMY for cyst [name], MD)  . SHOULDER ARTHROSCOPY W/ ROTATOR CUFF REPAIR Right   . TONSILLECTOMY      There were no vitals filed for this visit.  Subjective Assessment - 01/29/18 1704    Subjective  Patient states he fell on the past Friday and have avoided walking since the fall. Patient states he was sore over the past two days but reports his pain has been feeling better.     Pertinent History  Previous lumbar surgery (unknown type states possible fusion), multiple lumbar steroid injects, currently taking pain medication    Limitations  Sitting;Lifting;Standing;Walking    How long can you walk comfortably?  20-86min    Patient Stated Goals  To return to walking, playing with grandson    Currently in Pain?  Yes    Pain Score  3     Pain Location  Back    Pain Orientation  Lower    Pain Descriptors / Indicators  Aching    Pain Type  Chronic pain    Pain Onset  More than a month ago    Pain Frequency  Constant  TREATMENT  Therapeutic Exercise Squats with UE support --  x 15 Hip abduction in standing with UE support -- x 20; x 20 with YTB Hip extension in standing with UE support -- x 20; x 20 with YTB Hip ER in standing - x 20 Lumbar extension in standing with UE support - x 20  Single leg stance with intermittent UE support - 40sec x 5  Neuromuscular Reeducation Pain comes from the brain: Patient was educated on the concept of pain as an output of the brain, including nociception versus pain, inhibition and facilitation, and threat level using metaphors to promote deep learning   Calming Sensitive Nerves: Patient was educated regarding endogenous mechanisms and strategies to increase the brain's production of chemicals to decrease pain, such as aerobic exercise and improved pain knowledge. The concepts of pacing, graded exposure, "sore but safe" and "hurt does  not equal harm" were discussed. Sleep hygiene and diaphragmatic breathing topics were introduced to help calm the nervous system.  Addressed neuromuscular limitations to improve pain knowledge and overall decrease in pain. Patient also addresses decreased hip strength and performed mobility based exercises as well as hip strengthening to improve this limitation as well as decrease fall risk   PT Education - 01/29/18 1708    Education Details  Hip abduction and extension with YTB added to HEP; form/technique with exercise    Person(s) Educated  Patient    Methods  Explanation;Demonstration    Comprehension  Verbalized understanding;Returned demonstration          PT Long Term Goals - 01/15/18 1824      PT LONG TERM GOAL #1   Title  Patient will be able to walk >1 hour before requiring a sitting rest break to better be able to purchase groceries.    Baseline  22min before requiring sitting    Time  6    Period  Weeks    Status  New    Target Date  02/26/18      PT LONG TERM GOAL #2   Title  Patient will be independent with HEP to continue benefits of therapy after discharge.     Baseline  Dependent with HEP    Time  6    Period  Weeks    Status  New    Target Date  02/26/18      PT LONG TERM GOAL #3   Title  Patient will be able to sleep throughout the night waking up <2 times to improve immunoresponse and decrease pain.    Baseline  wakes up >3 times each night    Time  6    Period  Weeks    Status  New    Target Date  02/26/18      PT LONG TERM GOAL #4   Title  Patient will have a worst pain of 3/10 in the past week to better be able to perform ADLs with less pain.    Baseline  10/10 pain    Time  6    Period  Weeks    Status  New    Target Date  02/26/18            Plan - 01/29/18 1712    Clinical Impression Statement  Patient demosntrates good understanding of pain neuroscience education with the decorrelation between pain and inury. Patient was able to  give examples from real life experiences to apply to his life. Patient demonstrates improvement overall and is able to perform  exercises with greater level of resistance compared to previous sessions. Patient will benefit from further skilled therapy to return to prior level of function.     Rehab Potential  Fair    Clinical Impairments Affecting Rehab Potential  (+) highly motivated (-) persistent pain    PT Frequency  2x / week    PT Duration  6 weeks    PT Treatment/Interventions  Electrical Stimulation;Iontophoresis 4mg /ml Dexamethasone;Moist Heat;Traction;Ultrasound;Cryotherapy;Therapeutic exercise;Therapeutic activities;Balance training;Patient/family education;Neuromuscular re-education;Manual techniques;Dry needling;Scar mobilization;Passive range of motion;Joint Manipulations    PT Next Visit Plan  progress strengthening, PNE    PT Home Exercise Plan  see education section    Consulted and Agree with Plan of Care  Patient       Patient will benefit from skilled therapeutic intervention in order to improve the following deficits and impairments:  Abnormal gait, Pain, Increased fascial restricitons, Increased muscle spasms, Decreased strength, Decreased range of motion, Decreased endurance, Hypomobility, Decreased balance, Decreased activity tolerance, Difficulty walking, Obesity  Visit Diagnosis: Chronic bilateral low back pain, unspecified whether sciatica present  Muscle weakness (generalized)  Muscle spasm of back     Problem List Patient Active Problem List   Diagnosis Date Noted  . Diabetes mellitus with diabetic nephropathy, with long-term current use of insulin ([place]) 12/19/2017  . Constipation 12/19/2017  . Chronic gout 12/19/2017  . PAD (peripheral artery disease) ([place]) 05/07/2017  . Upper airway cough syndrome 08/21/2016  . S/P parathyroidectomy 04/18/2016  . Uncontrolled type 2 diabetes mellitus with peripheral neuropathy ([place]) 03/12/2016  . ECG abnormal 02/28/2016   . Back pain 10/09/2015  . Loss of hearing 05/30/2015  . CKD stage 3 due to type 2 diabetes mellitus ([place]) 01/27/2014  . Pain in joint, ankle and foot 10/08/2013  . Obesity, morbid, BMI 40.0-49.9 ([place]) 03/08/2013  . Impotence of organic origin 03/11/2011  . CAD (coronary artery disease) 11/19/2010  . URINARY CALCULUS 03/26/2010  . HEMATURIA UNSPECIFIED 02/27/2010  . Dyslipidemia 04/13/2009  . ALLERGIC RHINITIS 04/13/2009  . DEPRESSION 03/14/2009  . PROTEINURIA, MILD 09/16/2008  . History of deep vein thrombosis (DVT) of lower extremity 12/03/2007  . OSA (obstructive sleep apnea) 12/03/2007  . History of pulmonary embolus (PE) 10/05/2007  . [place] DISEASE, LUMBAR 10/05/2007  . FOOT PAIN, LEFT 08/21/2007  . Chronic insomnia 08/21/2007  . PERNICIOUS ANEMIA 11/20/2006  . Iron deficiency anemia 07/26/2006  . Essential hypertension 07/26/2006  . Asthmatic bronchitis 07/26/2006  . GERD 07/26/2006  . COLONIC POLYPS, HX OF 07/26/2006    Blythe Stanford, PT DPT 01/29/2018, 5:19 PM  [facility] [address], [place], [address] Phone: [phone]   Fax:  [phone]  Name: Donald [name] MRN: [id number] Date of Birth: 06-01-1945

## 2018-02-06 ENCOUNTER — Ambulatory Visit (INDEPENDENT_AMBULATORY_CARE_PROVIDER_SITE_OTHER): Payer: Medicare Other | Admitting: Family Medicine

## 2018-02-06 ENCOUNTER — Encounter: Payer: Self-pay | Admitting: Family Medicine

## 2018-02-06 VITALS — BP 142/60 | HR 80 | Temp 99.8°F | Ht 65.0 in | Wt 272.0 lb

## 2018-02-06 DIAGNOSIS — J019 Acute sinusitis, unspecified: Secondary | ICD-10-CM

## 2018-02-06 MED ORDER — HYDROCODONE-HOMATROPINE 5-1.5 MG/5ML PO SYRP: 5.0000 mL | ORAL_SOLUTION | Freq: Two times a day (BID) | ORAL | 0 refills | Status: DC | PRN

## 2018-02-06 MED ORDER — AMOXICILLIN-POT CLAVULANATE 875-125 MG PO TABS: 1.0000 | ORAL_TABLET | Freq: Two times a day (BID) | ORAL | 0 refills | Status: AC

## 2018-02-06 NOTE — Progress Notes (Signed)
BP (!) 142/60 (BP Location: Right Arm, Patient Position: Sitting, Cuff Size: Large)   Pulse 80   Temp 99.8 F (37.7 C) (Oral)   Ht 5\' 5"  (1.651 m)   Wt 272 lb (123.4 kg)   SpO2 96%   BMI 45.26 kg/m    CC: cough Subjective:    Patient ID: Donald [name], male    DOB: March 11, 1945, 73 y.o.   MRN: [id number]  HPI: Donald [name] is a 73 y.o. male presenting on 02/06/2018 for Cough (C/o productive cough, chills, runny nose. Sxs started 02/01/18. Tried Tylenol Cold, barely helpful. )   6d h/o cough, congestion, rhinorrhea, chills last few nights. Feels feverish. Coughing up green/brown mucous. Some dyspnea and wheezing. Mouth stays dry. Progressive worsening. Started with ST and [place]. HA especially bad yesterday. Head > chest congestion.  No ear or tooth pain, body aches.  Treating with tylenol cold.   No sick contacts at home.  No smokers at home. H/o asthmatic bronchitis. H/o asthma.   Tessalon perls don't help.     Relevant past medical, surgical, family and social history reviewed and updated as indicated. Interim medical history since our last visit reviewed. Allergies and medications reviewed and updated. Outpatient Medications Prior to Visit  Medication Sig Dispense Refill  . allopurinol (ZYLOPRIM) 300 MG tablet TAKE ONE TABLET BY MOUTH DAILY 30 tablet 2  . ALPRAZolam (XANAX) 1 MG tablet TAKE ONE TABLET BY MOUTH AT BEDTIME AS NEEDED FOR SLEEP 30 tablet 0  . amLODipine (NORVASC) 2.5 MG tablet Take 2.5 mg by mouth daily.    Marland Kitchen aspirin 81 MG chewable tablet Chew 81 mg by mouth daily.    . colchicine (COLCRYS) 0.6 MG tablet TAKE 1 TABLET BY MOUTH EVERY HOUR UNTIL SYMPTOMS ARE BETTER, MAX OF 6TABS PER DAY. STOP IF DIARRHEA 30 tablet 4  . ferrous sulfate 325 (65 FE) MG tablet Take 1 tablet (325 mg total) by mouth daily with breakfast.  3  . furosemide (LASIX) 80 MG tablet Take 1 tablet (80 mg total) by mouth daily. 90 tablet 3  . gabapentin (NEURONTIN) 600 MG tablet  Take 2 tablets (1,200 mg total) by mouth at bedtime. 180 tablet 3  . glucose blood (GE100 BLOOD GLUCOSE TEST) test strip TEST 3-4 TIMES DAILY AS DIRECTED 100 each 5  . HYDROcodone-acetaminophen (NORCO) 10-325 MG tablet Take 1 tablet by mouth every 6 (six) hours as needed for moderate pain. 30 tablet 0  . insulin NPH Human (NOVOLIN N) 100 UNIT/ML injection Inject 2.8 mLs (280 Units total) into the skin at bedtime. 90 mL 5  . insulin regular (NOVOLIN R RELION) 100 units/mL injection 3 times a day (just before each meal) (200-170-260) units 21 vial 11  . losartan (COZAAR) 100 MG tablet TAKE ONE TABLET BY MOUTH AT BEDTIME 90 tablet 3  . lovastatin (MEVACOR) 40 MG tablet Take 2 tablets (80 mg total) by mouth at bedtime. 180 tablet 3  . Naphazoline-Pheniramine (OPCON-A OP) Place 1 drop into both eyes 2 (two) times daily as needed.    . nortriptyline (PAMELOR) 25 MG capsule Take 1 capsule (25 mg total) by mouth at bedtime. 30 capsule 3  . ondansetron (ZOFRAN) 4 MG tablet Take 1 tablet (4 mg total) by mouth every 4 (four) hours as needed for nausea. 20 tablet 0  . Sennosides-Docusate Sodium (STOOL SOFTENER & LAXATIVE PO) Take 2 tablets by mouth at bedtime.    . sildenafil (VIAGRA) 100 MG tablet Take 1 tablet (100  mg total) by mouth daily as needed for erectile dysfunction. 90 tablet 3  . vitamin C (ASCORBIC ACID) 500 MG tablet Take 1 tablet (500 mg total) by mouth daily.     No facility-administered medications prior to visit.      Per HPI unless specifically indicated in ROS section below Review of Systems Objective:    BP (!) 142/60 (BP Location: Right Arm, Patient Position: Sitting, Cuff Size: Large)   Pulse 80   Temp 99.8 F (37.7 C) (Oral)   Ht 5\' 5"  (1.651 m)   Wt 272 lb (123.4 kg)   SpO2 96%   BMI 45.26 kg/m   Wt Readings from Last 3 Encounters:  02/06/18 272 lb (123.4 kg)  12/19/17 272 lb 4 oz (123.5 kg)  10/21/17 271 lb 3.2 oz (123 kg)    Physical Exam Vitals signs and nursing  note reviewed.  Constitutional:      General: He is not in acute distress.    Appearance: He is well-developed.  HENT:     Head: Normocephalic and atraumatic.     Right Ear: Hearing, tympanic membrane, ear canal and external ear normal.     Left Ear: Hearing, tympanic membrane, ear canal and external ear normal.     Nose: Mucosal edema and congestion present. No rhinorrhea.     Right Sinus: Frontal sinus tenderness present. No maxillary sinus tenderness.     Left Sinus: Frontal sinus tenderness present. No maxillary sinus tenderness.     Mouth/Throat:     Mouth: Mucous membranes are moist.     Pharynx: Oropharynx is clear. Uvula midline. Posterior oropharyngeal erythema present. No oropharyngeal exudate.     Tonsils: No tonsillar abscesses.  Eyes:     General: No scleral icterus.    Conjunctiva/sclera: Conjunctivae normal.     Pupils: Pupils are equal, round, and reactive to light.  Neck:     Musculoskeletal: Normal range of motion and neck supple.  Cardiovascular:     Rate and Rhythm: Normal rate and regular rhythm.     Pulses: Normal pulses.     Heart sounds: Murmur (2/6 systolic) present.  Pulmonary:     Effort: Pulmonary effort is normal. No respiratory distress.     Breath sounds: Decreased breath sounds present. No wheezing, rhonchi or rales.     Comments: Crackles LLL, clear with deep cough Lymphadenopathy:     Cervical: No cervical adenopathy.  Skin:    General: Skin is warm and dry.     Findings: No rash.       Lab Results  Component Value Date   HGBA1C 8.4 (A) 10/21/2017    Assessment & Plan:   Problem List Items Addressed This Visit    Acute sinusitis - Primary    Will cover for bacterial sinusitis given comorbidities with augmentin course. Hycodan for cough, aware not to mix with his hydrocodone pills. Red flags to seek further care reviewed. Update if not improving with treatment. Pt agrees with plan.       Relevant Medications   amoxicillin-clavulanate  (AUGMENTIN) 875-125 MG tablet   HYDROcodone-homatropine (HYCODAN) 5-1.5 MG/5ML syrup       Meds ordered this encounter  Medications  . amoxicillin-clavulanate (AUGMENTIN) 875-125 MG tablet    Sig: Take 1 tablet by mouth 2 (two) times daily for 10 days.    Dispense:  20 tablet    Refill:  0  . DISCONTD: HYDROcodone-homatropine (HYCODAN) 5-1.5 MG/5ML syrup    Sig: Take 5 mLs by  mouth 2 (two) times daily as needed for cough (sedation precautions).    Dispense:  120 mL    Refill:  0  . HYDROcodone-homatropine (HYCODAN) 5-1.5 MG/5ML syrup    Sig: Take 5 mLs by mouth 2 (two) times daily as needed for cough (sedation precautions).    Dispense:  120 mL    Refill:  0   No orders of the defined types were placed in this encounter.   Follow up plan: No follow-ups on file.  Ria Bush, MD

## 2018-02-06 NOTE — Patient Instructions (Addendum)
I think you have sinusitis - treat with augmentin antibiotic for 10 days. Push fluids and rest.  Take hydromet for cough as needed at night time  Let us know if fever >101 or worsening symptoms despite treatment

## 2018-02-06 NOTE — Assessment & Plan Note (Signed)
Will cover for bacterial sinusitis given comorbidities with augmentin course. Hycodan for cough, aware not to mix with his hydrocodone pills. Red flags to seek further care reviewed. Update if not improving with treatment. Pt agrees with plan.

## 2018-02-09 ENCOUNTER — Telehealth: Payer: Self-pay | Admitting: Family Medicine

## 2018-02-09 NOTE — Telephone Encounter (Signed)
Left message asking pt to call office please let pt know lisa will be out of the office 3/4 and his appointment has been changed to a fasting lab appointment

## 2018-02-10 ENCOUNTER — Ambulatory Visit: Payer: Medicare Other

## 2018-02-10 DIAGNOSIS — M545 Low back pain, unspecified: Secondary | ICD-10-CM

## 2018-02-10 DIAGNOSIS — G8929 Other chronic pain: Secondary | ICD-10-CM

## 2018-02-10 DIAGNOSIS — M6281 Muscle weakness (generalized): Secondary | ICD-10-CM | POA: Diagnosis not present

## 2018-02-10 DIAGNOSIS — M62830 Muscle spasm of back: Secondary | ICD-10-CM | POA: Diagnosis not present

## 2018-02-10 NOTE — Therapy (Signed)
[facility] [address], [place], [address] Phone: [phone]   Fax:  [phone]  Physical Therapy Treatment  Patient Details  Name: BRESLIN HEMANN MRN: [id number] Date of Birth: 1945/08/10 Referring Provider (PT): Ramos MD   Encounter Date: 02/10/2018  PT End of Session - 02/10/18 1544    Visit Number  4    Number of Visits  13    Date for PT Re-Evaluation  02/26/18    Authorization Type  4 / 10 Medicare    PT Start Time  1430    PT Stop Time  1515    PT Time Calculation (min)  45 min    Activity Tolerance  Patient tolerated treatment well    Behavior During Therapy  [facility] for tasks assessed/performed       Past Medical History:  Diagnosis Date  . ANEMIA-IRON DEFICIENCY 07/26/2006  . Anxiety   . ASTHMA 07/26/2006  . Asthma   . Back pain, chronic   . Bell's palsy   . Chronic kidney disease    stage 3 renal failure  . COLONIC POLYPS, HX OF 07/26/2006  . DEPRESSION 03/14/2009  . DIABETES MELLITUS, TYPE II 07/26/2006  . [place] DISEASE, LUMBAR 10/05/2007  . DVT 12/03/2007  . DYSLIPIDEMIA 04/13/2009  . Edema    ankle  . GERD 07/26/2006  . Gout   . Heart murmur   . History of kidney stones   . HYPERTENSION 07/26/2006  . INSOMNIA 08/21/2007  . Neuropathy   . OBSTRUCTIVE SLEEP APNEA 12/03/2007   Use C-PAP  . PERIPHERAL NEUROPATHY 07/26/2006  . Pernicious anemia 11/20/2006  . PULMONARY EMBOLISM 10/05/2007  . Renal insufficiency   . Shortness of breath dyspnea     Past Surgical History:  Procedure Laterality Date  . APPENDECTOMY  1968  . [place], 06/14/2008   Dr. Trenton Gammon at [facility] (06/10)  . CARDIAC CATHETERIZATION    . CARPAL TUNNEL RELEASE Right 10/2017  . CATARACT EXTRACTION W/PHACO Left 05/30/2014   Procedure: CATARACT EXTRACTION PHACO AND INTRAOCULAR LENS PLACEMENT (IOC);  Surgeon: Estill Cotta, MD;  Location: [facility] ORS;  Service: Ophthalmology;  Laterality: Left;  [name]  01:20 AP% 23.7 CDE 31.86  . LITHOTRIPSY     X 2  . PARATHYROIDECTOMY Right 04/18/2016   PARATHYROIDECTOMY for cyst [name], MD)  . SHOULDER ARTHROSCOPY W/ ROTATOR CUFF REPAIR Right   . TONSILLECTOMY      There were no vitals filed for this visit.  Subjective Assessment - 02/10/18 1533    Subjective  Patient reports he has been performing his exercises at home but has been sick from a sinus infection and has not been walking as much as normal since the sickness.     Pertinent History  Previous lumbar surgery (unknown type states possible fusion), multiple lumbar steroid injects, currently taking pain medication    Limitations  Sitting;Lifting;Standing;Walking    How long can you walk comfortably?  20-41min    Patient Stated Goals  To return to walking, playing with grandson    Currently in Pain?  Yes    Pain Score  3     Pain Location  Back    Pain Orientation  Lower    Pain Descriptors / Indicators  Aching    Pain Type  Chronic pain    Pain Onset  More than a month ago    Pain Frequency  Constant       TREATMENT  Pain Neuroscience education Nerves as [employer]) Patient educated on the concept of neuroplasticity and how factors such as temperature, stress, movement, immunity, and blood flow affect pain via ion channel expression. Instruction provided regarding homeostasis/ion channel balance disruption might occur based on what your brain thinks is needed for survival.  Therapeutic Exercise Squats in standing - x 15 Squats with exacerbated hip movements - x 15  Hip abduction with UE support -- 2 x 10  Hip extension with UE support - 2 x 10  Heel raises with glute squeeze - x 20 Single leg stance with intermittent UE support - x 20  Single leg stance with rotation B hip movement to activate internal/external rotations - x 15   Performed greater amount of exercises in standing to decrease pain and spasms with standing motions such as walking in the store.  Patient demonstrates improvement overall    PT Education - 02/10/18 1543    Education Details  form/technique with exercise: Squats added to HEP    Person(s) Educated  Patient    Methods  Explanation;Demonstration    Comprehension  Verbalized understanding;Returned demonstration          PT Long Term Goals - 01/15/18 1824      PT LONG TERM GOAL #1   Title  Patient will be able to walk >1 hour before requiring a sitting rest break to better be able to purchase groceries.    Baseline  78min before requiring sitting    Time  6    Period  Weeks    Status  New    Target Date  02/26/18      PT LONG TERM GOAL #2   Title  Patient will be independent with HEP to continue benefits of therapy after discharge.     Baseline  Dependent with HEP    Time  6    Period  Weeks    Status  New    Target Date  02/26/18      PT LONG TERM GOAL #3   Title  Patient will be able to sleep throughout the night waking up <2 times to improve immunoresponse and decrease pain.    Baseline  wakes up >3 times each night    Time  6    Period  Weeks    Status  New    Target Date  02/26/18      PT LONG TERM GOAL #4   Title  Patient will have a worst pain of 3/10 in the past week to better be able to perform ADLs with less pain.    Baseline  10/10 pain    Time  6    Period  Weeks    Status  New    Target Date  02/26/18            Plan - 02/10/18 1707    Clinical Impression Statement  Patient demonstrates good understanding of neuromuscular reeducation with ability to apply pain neuroscience education to real life situations and apply to various aspects of her life. Patient demonstrates improvement overall with functional activities as well with ability to perform exercises in HEP without increased cueing indicating functional carryover. Patient demosntrates ability to perform higher level exercises most notably exercises in single leg stance and patient will benefit from further skilled therapy to  return to prior level of function.     Rehab Potential  Fair    Clinical Impairments Affecting Rehab Potential  (+) highly motivated (-) persistent  pain    PT Frequency  2x / week    PT Duration  6 weeks    PT Treatment/Interventions  Electrical Stimulation;Iontophoresis 4mg /ml Dexamethasone;Moist Heat;Traction;Ultrasound;Cryotherapy;Therapeutic exercise;Therapeutic activities;Balance training;Patient/family education;Neuromuscular re-education;Manual techniques;Dry needling;Scar mobilization;Passive range of motion;Joint Manipulations    PT Next Visit Plan  progress strengthening, PNE    PT Home Exercise Plan  see education section    Consulted and Agree with Plan of Care  Patient       Patient will benefit from skilled therapeutic intervention in order to improve the following deficits and impairments:  Abnormal gait, Pain, Increased fascial restricitons, Increased muscle spasms, Decreased strength, Decreased range of motion, Decreased endurance, Hypomobility, Decreased balance, Decreased activity tolerance, Difficulty walking, Obesity  Visit Diagnosis: Chronic bilateral low back pain, unspecified whether sciatica present  Muscle weakness (generalized)  Muscle spasm of back     Problem List Patient Active Problem List   Diagnosis Date Noted  . Diabetes mellitus with diabetic nephropathy, with long-term current use of insulin ([place]) 12/19/2017  . Constipation 12/19/2017  . Chronic gout 12/19/2017  . PAD (peripheral artery disease) ([place]) 05/07/2017  . Upper airway cough syndrome 08/21/2016  . S/P parathyroidectomy 04/18/2016  . Uncontrolled type 2 diabetes mellitus with peripheral neuropathy ([place]) 03/12/2016  . ECG abnormal 02/28/2016  . Back pain 10/09/2015  . Loss of hearing 05/30/2015  . CKD stage 3 due to type 2 diabetes mellitus ([place]) 01/27/2014  . Pain in joint, ankle and foot 10/08/2013  . Obesity, morbid, BMI 40.0-49.9 ([place]) 03/08/2013  . Impotence of organic origin  03/11/2011  . CAD (coronary artery disease) 11/19/2010  . URINARY CALCULUS 03/26/2010  . Acute sinusitis 02/27/2010  . HEMATURIA UNSPECIFIED 02/27/2010  . Dyslipidemia 04/13/2009  . ALLERGIC RHINITIS 04/13/2009  . DEPRESSION 03/14/2009  . PROTEINURIA, MILD 09/16/2008  . History of deep vein thrombosis (DVT) of lower extremity 12/03/2007  . OSA (obstructive sleep apnea) 12/03/2007  . History of pulmonary embolus (PE) 10/05/2007  . [place] DISEASE, LUMBAR 10/05/2007  . FOOT PAIN, LEFT 08/21/2007  . Chronic insomnia 08/21/2007  . PERNICIOUS ANEMIA 11/20/2006  . Iron deficiency anemia 07/26/2006  . Essential hypertension 07/26/2006  . Asthmatic bronchitis 07/26/2006  . GERD 07/26/2006  . COLONIC POLYPS, HX OF 07/26/2006    Blythe Stanford 02/10/2018, 5:13 PM  [facility] [address], [place], [address] Phone: [phone]   Fax:  [phone]  Name: JERMALE CRASS MRN: [id number] Date of Birth: 1945/02/23

## 2018-02-12 ENCOUNTER — Ambulatory Visit: Payer: Medicare Other

## 2018-02-17 ENCOUNTER — Ambulatory Visit: Payer: Medicare Other

## 2018-02-18 ENCOUNTER — Ambulatory Visit: Payer: Medicare Other | Admitting: Endocrinology

## 2018-02-19 ENCOUNTER — Ambulatory Visit: Payer: Medicare Other

## 2018-02-24 ENCOUNTER — Ambulatory Visit: Payer: Medicare Other

## 2018-02-25 ENCOUNTER — Ambulatory Visit: Payer: Medicare Other | Admitting: Endocrinology

## 2018-02-25 ENCOUNTER — Ambulatory Visit: Payer: Medicare Other | Attending: Chiropractic Medicine

## 2018-02-25 DIAGNOSIS — M545 Low back pain, unspecified: Secondary | ICD-10-CM

## 2018-02-25 DIAGNOSIS — M6281 Muscle weakness (generalized): Secondary | ICD-10-CM | POA: Insufficient documentation

## 2018-02-25 DIAGNOSIS — G8929 Other chronic pain: Secondary | ICD-10-CM | POA: Diagnosis not present

## 2018-02-25 DIAGNOSIS — M62830 Muscle spasm of back: Secondary | ICD-10-CM | POA: Insufficient documentation

## 2018-02-26 ENCOUNTER — Ambulatory Visit: Payer: Medicare Other

## 2018-02-26 NOTE — Therapy (Signed)
[facility] [address] [place], [address] Phone: [phone]   Fax:  [phone]  Physical Therapy Treatment  Patient Details  Name: Donald [name] MRN: [id number] Date of Birth: 1945/09/19 Referring Provider (PT): Ramos MD   Encounter Date: 02/25/2018  PT End of Session - 02/25/18 1747    Visit Number  5    Number of Visits  13    Date for PT Re-Evaluation  02/26/18    Authorization Type  5 / 10 Medicare    PT Start Time  1630    PT Stop Time  8101    PT Time Calculation (min)  45 min    Activity Tolerance  Patient tolerated treatment well    Behavior During Therapy  [facility] for tasks assessed/performed       Past Medical History:  Diagnosis Date  . ANEMIA-IRON DEFICIENCY 07/26/2006  . Anxiety   . ASTHMA 07/26/2006  . Asthma   . Back pain, chronic   . Bell's palsy   . Chronic kidney disease    stage 3 renal failure  . COLONIC POLYPS, HX OF 07/26/2006  . DEPRESSION 03/14/2009  . DIABETES MELLITUS, TYPE II 07/26/2006  . [place] DISEASE, LUMBAR 10/05/2007  . DVT 12/03/2007  . DYSLIPIDEMIA 04/13/2009  . Edema    ankle  . GERD 07/26/2006  . Gout   . Heart murmur   . History of kidney stones   . HYPERTENSION 07/26/2006  . INSOMNIA 08/21/2007  . Neuropathy   . OBSTRUCTIVE SLEEP APNEA 12/03/2007   Use C-PAP  . PERIPHERAL NEUROPATHY 07/26/2006  . Pernicious anemia 11/20/2006  . PULMONARY EMBOLISM 10/05/2007  . Renal insufficiency   . Shortness of breath dyspnea     Past Surgical History:  Procedure Laterality Date  . APPENDECTOMY  1968  . [place], 06/14/2008   Dr. Trenton Gammon at [facility] (06/10)  . CARDIAC CATHETERIZATION    . CARPAL TUNNEL RELEASE Right 10/2017  . CATARACT EXTRACTION W/PHACO Left 05/30/2014   Procedure: CATARACT EXTRACTION PHACO AND INTRAOCULAR LENS PLACEMENT (IOC);  Surgeon: Estill Cotta, MD;  Location: [facility] ORS;  Service: Ophthalmology;  Laterality: Left;  [name]  01:20 AP% 23.7 CDE 31.86  . LITHOTRIPSY     X 2  . PARATHYROIDECTOMY Right 04/18/2016   PARATHYROIDECTOMY for cyst [name], MD)  . SHOULDER ARTHROSCOPY W/ ROTATOR CUFF REPAIR Right   . TONSILLECTOMY      There were no vitals filed for this visit.  Subjective Assessment - 02/25/18 1733    Subjective  Patient reports he has not been performing his walking program but has been performing his exercises at home. Patient states his pain has been significantly improving and reports overall functional improvement with walking. Although patient is reporting improvement with symptoms, he continues to have increased difficulty with prolonged walking. He reports he does not take as much pain medication as he did previously.     Pertinent History  Previous lumbar surgery (unknown type states possible fusion), multiple lumbar steroid injects, currently taking pain medication    Limitations  Sitting;Lifting;Standing;Walking    How long can you walk comfortably?  20-65min    Patient Stated Goals  To return to walking, playing with grandson    Currently in Pain?  Yes    Pain Score  3     Pain Location  Back    Pain Orientation  Lower    Pain Descriptors / Indicators  Aching  Pain Type  Chronic pain    Pain Onset  More than a month ago    Pain Frequency  Constant         TREATMENT Pain Neuroscience education/ Neuromuscular reeducation  Reviewed importance of avoiding blue light before bed; importance of sleeping and techniques to perform if having difficulties fall asleep. Reviewed sleeps importance on function and importance of sleep on pain. Pain comes from the brain Patient was educated on the concept of pain as an output of the brain, including nociception versus pain, inhibition and facilitation, and threat level using metaphors to promote deep learning   Immune System and pain Reviewed with the patient the role of the immune system plays in prolonged pain states and the phenomenon  of spreading pain and flare-ups of old injury, scars or pain. Explored earlier concepts to improve immune health further.   Tissue (Tissue Issues) Patient educated about the relationship between tissue injury and pain and that pain is an output by the brain. Included was the principle that tissue injury does not have to hurt, and that you can have pain without having tissue injury, particularly with a hypersensitive nervous system.    Patient demonstrates good understanding of information at end of the session and is able to accurately reproduce the information   PT Education - 02/25/18 1747    Education Details  form/technique with exercises; Educated on brains tole in pain    Person(s) Educated  Patient    Methods  Explanation;Demonstration    Comprehension  Verbalized understanding;Returned demonstration          PT Long Term Goals - 01/15/18 1824      PT LONG TERM GOAL #1   Title  Patient will be able to walk >1 hour before requiring a sitting rest break to better be able to purchase groceries.    Baseline  29min before requiring sitting    Time  6    Period  Weeks    Status  New    Target Date  02/26/18      PT LONG TERM GOAL #2   Title  Patient will be independent with HEP to continue benefits of therapy after discharge.     Baseline  Dependent with HEP    Time  6    Period  Weeks    Status  New    Target Date  02/26/18      PT LONG TERM GOAL #3   Title  Patient will be able to sleep throughout the night waking up <2 times to improve immunoresponse and decrease pain.    Baseline  wakes up >3 times each night    Time  6    Period  Weeks    Status  New    Target Date  02/26/18      PT LONG TERM GOAL #4   Title  Patient will have a worst pain of 3/10 in the past week to better be able to perform ADLs with less pain.    Baseline  10/10 pain    Time  6    Period  Weeks    Status  New    Target Date  02/26/18            Plan - 02/25/18 1748    Clinical  Impression Statement  Reviewed the role of the brain with production of pain and the 5 major steps of pain. Input - Processing - Decision - Action - Adaptation and had patient  aply the lessons learned to his life situation. Patient demonstrates good understanding of the information and is able to recall moments in life when he had pain in this fashion. Did not perform exercises today, mostly due to lack of time and recent decrease in sleep. Patient will benefit from further skilled therapy to return to prior level of function.     Rehab Potential  Fair    Clinical Impairments Affecting Rehab Potential  (+) highly motivated (-) persistent pain    PT Frequency  2x / week    PT Duration  6 weeks    PT Treatment/Interventions  Electrical Stimulation;Iontophoresis 4mg /ml Dexamethasone;Moist Heat;Traction;Ultrasound;Cryotherapy;Therapeutic exercise;Therapeutic activities;Balance training;Patient/family education;Neuromuscular re-education;Manual techniques;Dry needling;Scar mobilization;Passive range of motion;Joint Manipulations    PT Next Visit Plan  progress strengthening, PNE    PT Home Exercise Plan  see education section    Consulted and Agree with Plan of Care  Patient       Patient will benefit from skilled therapeutic intervention in order to improve the following deficits and impairments:  Abnormal gait, Pain, Increased fascial restricitons, Increased muscle spasms, Decreased strength, Decreased range of motion, Decreased endurance, Hypomobility, Decreased balance, Decreased activity tolerance, Difficulty walking, Obesity  Visit Diagnosis: Muscle weakness (generalized)  Chronic bilateral low back pain, unspecified whether sciatica present  Muscle spasm of back     Problem List Patient Active Problem List   Diagnosis Date Noted  . Diabetes mellitus with diabetic nephropathy, with long-term current use of insulin ([place]) 12/19/2017  . Constipation 12/19/2017  . Chronic gout 12/19/2017   . PAD (peripheral artery disease) ([place]) 05/07/2017  . Upper airway cough syndrome 08/21/2016  . S/P parathyroidectomy 04/18/2016  . Uncontrolled type 2 diabetes mellitus with peripheral neuropathy ([place]) 03/12/2016  . ECG abnormal 02/28/2016  . Back pain 10/09/2015  . Loss of hearing 05/30/2015  . CKD stage 3 due to type 2 diabetes mellitus ([place]) 01/27/2014  . Pain in joint, ankle and foot 10/08/2013  . Obesity, morbid, BMI 40.0-49.9 ([place]) 03/08/2013  . Impotence of organic origin 03/11/2011  . CAD (coronary artery disease) 11/19/2010  . URINARY CALCULUS 03/26/2010  . Acute sinusitis 02/27/2010  . HEMATURIA UNSPECIFIED 02/27/2010  . Dyslipidemia 04/13/2009  . ALLERGIC RHINITIS 04/13/2009  . DEPRESSION 03/14/2009  . PROTEINURIA, MILD 09/16/2008  . History of deep vein thrombosis (DVT) of lower extremity 12/03/2007  . OSA (obstructive sleep apnea) 12/03/2007  . History of pulmonary embolus (PE) 10/05/2007  . [place] DISEASE, LUMBAR 10/05/2007  . FOOT PAIN, LEFT 08/21/2007  . Chronic insomnia 08/21/2007  . PERNICIOUS ANEMIA 11/20/2006  . Iron deficiency anemia 07/26/2006  . Essential hypertension 07/26/2006  . Asthmatic bronchitis 07/26/2006  . GERD 07/26/2006  . COLONIC POLYPS, HX OF 07/26/2006    Blythe Stanford 02/26/2018, 9:16 AM  [facility] [address] [place], [address] Phone: [phone]   Fax:  [phone]  Name: Donald [name] MRN: [id number] Date of Birth: 01-Jul-1945

## 2018-03-03 ENCOUNTER — Other Ambulatory Visit: Payer: Self-pay | Admitting: Endocrinology

## 2018-03-03 ENCOUNTER — Ambulatory Visit: Payer: Medicare Other

## 2018-03-03 DIAGNOSIS — M6281 Muscle weakness (generalized): Secondary | ICD-10-CM

## 2018-03-03 DIAGNOSIS — G8929 Other chronic pain: Secondary | ICD-10-CM

## 2018-03-03 DIAGNOSIS — M545 Low back pain, unspecified: Secondary | ICD-10-CM

## 2018-03-03 DIAGNOSIS — M62830 Muscle spasm of back: Secondary | ICD-10-CM

## 2018-03-03 NOTE — Therapy (Signed)
[facility] [address] [place], [address] Phone: [phone]   Fax:  [phone]  Physical Therapy Treatment  Patient Details  Name: Donald [name] MRN: [id number] Date of Birth: 1945-03-07 Referring Provider (PT): Ramos MD   Encounter Date: 03/03/2018  PT End of Session - 03/03/18 1422    Visit Number  6    Number of Visits  13    Date for PT Re-Evaluation  02/26/18    Authorization Type  6 / 10 Medicare    PT Start Time  1115    PT Stop Time  1200    PT Time Calculation (min)  45 min    Activity Tolerance  Patient tolerated treatment well    Behavior During Therapy  [facility] for tasks assessed/performed       Past Medical History:  Diagnosis Date  . ANEMIA-IRON DEFICIENCY 07/26/2006  . Anxiety   . ASTHMA 07/26/2006  . Asthma   . Back pain, chronic   . Bell's palsy   . Chronic kidney disease    stage 3 renal failure  . COLONIC POLYPS, HX OF 07/26/2006  . DEPRESSION 03/14/2009  . DIABETES MELLITUS, TYPE II 07/26/2006  . [place] DISEASE, LUMBAR 10/05/2007  . DVT 12/03/2007  . DYSLIPIDEMIA 04/13/2009  . Edema    ankle  . GERD 07/26/2006  . Gout   . Heart murmur   . History of kidney stones   . HYPERTENSION 07/26/2006  . INSOMNIA 08/21/2007  . Neuropathy   . OBSTRUCTIVE SLEEP APNEA 12/03/2007   Use C-PAP  . PERIPHERAL NEUROPATHY 07/26/2006  . Pernicious anemia 11/20/2006  . PULMONARY EMBOLISM 10/05/2007  . Renal insufficiency   . Shortness of breath dyspnea     Past Surgical History:  Procedure Laterality Date  . APPENDECTOMY  1968  . [place], 06/14/2008   Dr. Trenton Gammon at [facility] (06/10)  . CARDIAC CATHETERIZATION    . CARPAL TUNNEL RELEASE Right 10/2017  . CATARACT EXTRACTION W/PHACO Left 05/30/2014   Procedure: CATARACT EXTRACTION PHACO AND INTRAOCULAR LENS PLACEMENT (IOC);  Surgeon: Estill Cotta, MD;  Location: [facility] ORS;  Service: Ophthalmology;  Laterality: Left;  [name]  01:20 AP% 23.7 CDE 31.86  . LITHOTRIPSY     X 2  . PARATHYROIDECTOMY Right 04/18/2016   PARATHYROIDECTOMY for cyst [name], MD)  . SHOULDER ARTHROSCOPY W/ ROTATOR CUFF REPAIR Right   . TONSILLECTOMY      There were no vitals filed for this visit.  Subjective Assessment - 03/03/18 1408    Subjective  Patient reports he has not been performing his HEP but states improvement in pain and spasms since beginning therapy. Although patient is improving, he continues to have decreased ability to walk for prolonged periods of time.     Pertinent History  Previous lumbar surgery (unknown type states possible fusion), multiple lumbar steroid injects, currently taking pain medication    Limitations  Sitting;Lifting;Standing;Walking    How long can you walk comfortably?  20-49min    Patient Stated Goals  To return to walking, playing with grandson    Currently in Pain?  Yes    Pain Score  3     Pain Location  Back    Pain Orientation  Lower    Pain Descriptors / Indicators  Aching    Pain Type  Chronic pain    Pain Onset  More than a month ago    Pain Frequency  Constant  TREATMENT Neuromuscular Reeducation  Pain comes from the brain Patient was educated on the concept of pain as an output of the brain, including nociception versus pain, inhibition and facilitation, and threat level using metaphors to promote deep learning   The role of input-processing-decision-output- adaption and the processes that follow and influences pain. Input as the inputs to the brain. Processes as the combination of inputs. Decision to brain for protection vs not protection, output being actions and pain, and adaption long term changes.  Focused on processes in chronic pain where our brains will create increased sensitivity to help protect [name] against injury/danger. Educated on the over sensitive alarm system.  Therapeutic Exercise  Lumbar flexion/extension with arms straight in standing --  x20 Lumbar rotation B in standing with OP at end range -- x 20 Lumbar extension with rotation - x 20 Hip abduction in standing with UE support - x 20  Hip extension in standing with UE support -- x 20   Educated on pain education and educated on how general pain processing works to increase understanding and further facilitate the decrease in pain. Went over exercises to perform when waiting in line.    PT Education - 03/03/18 1413    Education Details  form/technique with exercise; PNE    Person(s) Educated  Patient    Methods  Explanation;Demonstration    Comprehension  Verbalized understanding;Returned demonstration          PT Long Term Goals - 03/03/18 1440      PT LONG TERM GOAL #1   Title  Patient will be able to walk >1 hour before requiring a sitting rest break to better be able to purchase groceries.    Baseline  57min before requiring sitting; 03/03/2018: 40min before requiring sitting rest break    Time  6    Period  Weeks    Status  On-going      PT LONG TERM GOAL #2   Title  Patient will be independent with HEP to continue benefits of therapy after discharge.     Baseline  Dependent with HEP; 03/03/2018: required moderate cueing to perform    Time  6    Period  Weeks    Status  On-going      PT LONG TERM GOAL #3   Title  Patient will be able to sleep throughout the night waking up <2 times to improve immunoresponse and decrease pain.    Baseline  wakes up >3 times each night; 03/03/2018: wakes 1-2 times per night    Time  6    Period  Weeks    Status  Achieved      PT LONG TERM GOAL #4   Title  Patient will have a worst pain of 3/10 in the past week to better be able to perform ADLs with less pain.    Baseline  10/10 pain; 03/03/2018: 7/10    Time  6    Period  Weeks    Status  On-going            Plan - 03/03/18 1429    Clinical Impression Statement  Patient is making progress towards long term goals with improvement in worse level of pain, less  difficulty with sleeping at night, and improvement in ability to ambulate for longer periods of time with less total pain. Although patient is improving, he continues to have a significant level of pain with standing for prolonged periods of time, difficulty walking long distances and difficulty  with weight bearing for prolonged periods of time. Patient demonstrates decreased pain and spasms with exercises and will benefit from further skilled therapy to return to prior level of function.      Rehab Potential  Fair    Clinical Impairments Affecting Rehab Potential  (+) highly motivated (-) persistent pain    PT Frequency  2x / week    PT Duration  6 weeks    PT Treatment/Interventions  Electrical Stimulation;Iontophoresis 4mg /ml Dexamethasone;Moist Heat;Traction;Ultrasound;Cryotherapy;Therapeutic exercise;Therapeutic activities;Balance training;Patient/family education;Neuromuscular re-education;Manual techniques;Dry needling;Scar mobilization;Passive range of motion;Joint Manipulations    PT Next Visit Plan  progress strengthening, PNE    PT Home Exercise Plan  see education section    Consulted and Agree with Plan of Care  Patient       Patient will benefit from skilled therapeutic intervention in order to improve the following deficits and impairments:  Abnormal gait, Pain, Increased fascial restricitons, Increased muscle spasms, Decreased strength, Decreased range of motion, Decreased endurance, Hypomobility, Decreased balance, Decreased activity tolerance, Difficulty walking, Obesity  Visit Diagnosis: Muscle weakness (generalized)  Chronic bilateral low back pain, unspecified whether sciatica present  Muscle spasm of back     Problem List Patient Active Problem List   Diagnosis Date Noted  . Diabetes mellitus with diabetic nephropathy, with long-term current use of insulin ([place]) 12/19/2017  . Constipation 12/19/2017  . Chronic gout 12/19/2017  . PAD (peripheral artery disease)  ([place]) 05/07/2017  . Upper airway cough syndrome 08/21/2016  . S/P parathyroidectomy 04/18/2016  . Uncontrolled type 2 diabetes mellitus with peripheral neuropathy ([place]) 03/12/2016  . ECG abnormal 02/28/2016  . Back pain 10/09/2015  . Loss of hearing 05/30/2015  . CKD stage 3 due to type 2 diabetes mellitus ([place]) 01/27/2014  . Pain in joint, ankle and foot 10/08/2013  . Obesity, morbid, BMI 40.0-49.9 ([place]) 03/08/2013  . Impotence of organic origin 03/11/2011  . CAD (coronary artery disease) 11/19/2010  . URINARY CALCULUS 03/26/2010  . Acute sinusitis 02/27/2010  . HEMATURIA UNSPECIFIED 02/27/2010  . Dyslipidemia 04/13/2009  . ALLERGIC RHINITIS 04/13/2009  . DEPRESSION 03/14/2009  . PROTEINURIA, MILD 09/16/2008  . History of deep vein thrombosis (DVT) of lower extremity 12/03/2007  . OSA (obstructive sleep apnea) 12/03/2007  . History of pulmonary embolus (PE) 10/05/2007  . [place] DISEASE, LUMBAR 10/05/2007  . FOOT PAIN, LEFT 08/21/2007  . Chronic insomnia 08/21/2007  . PERNICIOUS ANEMIA 11/20/2006  . Iron deficiency anemia 07/26/2006  . Essential hypertension 07/26/2006  . Asthmatic bronchitis 07/26/2006  . GERD 07/26/2006  . COLONIC POLYPS, HX OF 07/26/2006    Blythe Stanford, PT DPT 03/03/2018, 3:35 PM  [facility] [address], [place], [address] Phone: [phone]   Fax:  [phone]  Name: Donald [name] MRN: [id number] Date of Birth: 11/28/1945

## 2018-03-03 NOTE — Addendum Note (Signed)
Addended by: Blain Pais on: 03/03/2018 03:46 PM   Modules accepted: Orders

## 2018-03-04 ENCOUNTER — Encounter: Payer: Self-pay | Admitting: Endocrinology

## 2018-03-04 ENCOUNTER — Other Ambulatory Visit: Payer: Self-pay

## 2018-03-04 ENCOUNTER — Ambulatory Visit (INDEPENDENT_AMBULATORY_CARE_PROVIDER_SITE_OTHER): Payer: Medicare Other | Admitting: Endocrinology

## 2018-03-04 VITALS — BP 148/60 | HR 59 | Ht 65.0 in | Wt 268.2 lb

## 2018-03-04 DIAGNOSIS — E119 Type 2 diabetes mellitus without complications: Secondary | ICD-10-CM | POA: Diagnosis not present

## 2018-03-04 LAB — POCT GLYCOSYLATED HEMOGLOBIN (HGB A1C): Hemoglobin A1C: 8.4 % — AB (ref 4.0–5.6)

## 2018-03-04 MED ORDER — INSULIN REGULAR HUMAN 100 UNIT/ML IJ SOLN: INTRAMUSCULAR | 0 refills | Status: DC

## 2018-03-04 MED ORDER — INSULIN NPH (HUMAN) (ISOPHANE) 100 UNIT/ML ~~LOC~~ SUSP: 290.0000 [IU] | Freq: Every day | SUBCUTANEOUS | 5 refills | Status: DC

## 2018-03-04 NOTE — Patient Instructions (Addendum)
Your blood pressure is high today.  Please see your primary care provider soon, to have it rechecked.   Please increase the insulin to the numbers listed below.   check your blood sugar twice a day.  vary the time of day when you check, between before the 3 meals, and at bedtime.  also check if you have symptoms of your blood sugar being too high or too low.  please keep a record of the readings and bring it to your next appointment here (or you can bring the meter itself).  You can write it on any piece of paper.  please call us sooner if your blood sugar goes below 70, or if you have a lot of readings over 200.  Please come back for a follow-up appointment in 2 months.

## 2018-03-04 NOTE — Progress Notes (Signed)
Subjective:    Patient ID: Donald [name], male    DOB: Oct 28, 1945, 73 y.o.   MRN: [id number]  HPI Pt returns for f/u of diabetes mellitus: DM type: Insulin-requiring type 2 Dx'ed: 1962 Complications: polyneuropathy, CAD and renal insufficiency Therapy: insulin since 1995 DKA: never.  Severe hypoglycemia: never.  Pancreatitis: never.  Other: he takes multiple daily injections; he takes human insulin, due to cost; he declines weight-loss surgery; he has severe insulin resistance; he declines pump; He often eats 2 meals per day, so he takes only 2 injections of novolog.  Interval history: no recent steroids.  He brings a record of his cbg's which I have reviewed today.  cbg's vary from 116-230.  There is no trend throughout the day.  He is recovering from AGE.   Past Medical History:  Diagnosis Date  . ANEMIA-IRON DEFICIENCY 07/26/2006  . Anxiety   . ASTHMA 07/26/2006  . Asthma   . Back pain, chronic   . Bell's palsy   . Chronic kidney disease    stage 3 renal failure  . COLONIC POLYPS, HX OF 07/26/2006  . DEPRESSION 03/14/2009  . DIABETES MELLITUS, TYPE II 07/26/2006  . [place] DISEASE, LUMBAR 10/05/2007  . DVT 12/03/2007  . DYSLIPIDEMIA 04/13/2009  . Edema    ankle  . GERD 07/26/2006  . Gout   . Heart murmur   . History of kidney stones   . HYPERTENSION 07/26/2006  . INSOMNIA 08/21/2007  . Neuropathy   . OBSTRUCTIVE SLEEP APNEA 12/03/2007   Use C-PAP  . PERIPHERAL NEUROPATHY 07/26/2006  . Pernicious anemia 11/20/2006  . PULMONARY EMBOLISM 10/05/2007  . Renal insufficiency   . Shortness of breath dyspnea     Past Surgical History:  Procedure Laterality Date  . APPENDECTOMY  1968  . [place], 06/14/2008   Dr. Trenton Gammon at [facility] (06/10)  . CARDIAC CATHETERIZATION    . CARPAL TUNNEL RELEASE Right 10/2017  . CATARACT EXTRACTION W/PHACO Left 05/30/2014   Procedure: CATARACT EXTRACTION PHACO AND INTRAOCULAR LENS PLACEMENT (IOC);  Surgeon: Estill Cotta, MD;   Location: [facility] ORS;  Service: Ophthalmology;  Laterality: Left;  [name] 01:20 AP% 23.7 CDE 31.86  . LITHOTRIPSY     X 2  . PARATHYROIDECTOMY Right 04/18/2016   PARATHYROIDECTOMY for cyst [name], MD)  . SHOULDER ARTHROSCOPY W/ ROTATOR CUFF REPAIR Right   . TONSILLECTOMY      Social History   Socioeconomic History  . Marital status: Married    Spouse name: Not on file  . Number of children: Not on file  . Years of education: Not on file  . Highest education level: Not on file  Occupational History  . Occupation: Disabled    Employer: [employer]  Social Needs  . Financial resource strain: Not on file  . Food insecurity:    Worry: Not on file    Inability: Not on file  . Transportation needs:    Medical: Not on file    Non-medical: Not on file  Tobacco Use  . Smoking status: Never Smoker  . Smokeless tobacco: Never Used  Substance and Sexual Activity  . Alcohol use: No  . Drug use: No  . Sexual activity: Not on file  Lifestyle  . Physical activity:    Days per week: Not on file    Minutes per session: Not on file  . Stress: Not on file  Relationships  . Social connections:    Talks on phone: Not on file  Gets together: Not on file    Attends religious service: Not on file    Active member of club or organization: Not on file    Attends meetings of clubs or organizations: Not on file    Relationship status: Not on file  . Intimate partner violence:    Fear of current or ex partner: Not on file    Emotionally abused: Not on file    Physically abused: Not on file    Forced sexual activity: Not on file  Other Topics Concern  . 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   Activity is severely limited by medical problems   Diet is "good".   Never a smoker   Alcohol: none          Current Outpatient Medications on File Prior to Visit  Medication Sig Dispense Refill  . allopurinol  (ZYLOPRIM) 300 MG tablet TAKE ONE TABLET BY MOUTH DAILY 30 tablet 2  . ALPRAZolam (XANAX) 1 MG tablet TAKE ONE TABLET BY MOUTH AT BEDTIME AS NEEDED FOR SLEEP 30 tablet 0  . amLODipine (NORVASC) 2.5 MG tablet Take 2.5 mg by mouth daily.    [place] Kitchen aspirin 81 MG chewable tablet Chew 81 mg by mouth daily.    . colchicine (COLCRYS) 0.6 MG tablet TAKE 1 TABLET BY MOUTH EVERY HOUR UNTIL SYMPTOMS ARE BETTER, MAX OF 6TABS PER DAY. STOP IF DIARRHEA 30 tablet 4  . furosemide (LASIX) 80 MG tablet Take 1 tablet (80 mg total) by mouth daily. 90 tablet 3  . gabapentin (NEURONTIN) 600 MG tablet Take 2 tablets (1,200 mg total) by mouth at bedtime. 180 tablet 3  . glucose blood (GE100 BLOOD GLUCOSE TEST) test strip TEST 3-4 TIMES DAILY AS DIRECTED 100 each 5  . HYDROcodone-acetaminophen (NORCO) 10-325 MG tablet Take 1 tablet by mouth every 6 (six) hours as needed for moderate pain. 30 tablet 0  . losartan (COZAAR) 100 MG tablet TAKE ONE TABLET BY MOUTH AT BEDTIME 90 tablet 3  . lovastatin (MEVACOR) 40 MG tablet Take 2 tablets (80 mg total) by mouth at bedtime. 180 tablet 3  . Naphazoline-Pheniramine (OPCON-A OP) Place 1 drop into both eyes 2 (two) times daily as needed.    . nortriptyline (PAMELOR) 25 MG capsule Take 1 capsule (25 mg total) by mouth at bedtime. 30 capsule 3  . ondansetron (ZOFRAN) 4 MG tablet Take 1 tablet (4 mg total) by mouth every 4 (four) hours as needed for nausea. 20 tablet 0  . Sennosides-Docusate Sodium (STOOL SOFTENER & LAXATIVE PO) Take 2 tablets by mouth at bedtime.    . sildenafil (VIAGRA) 100 MG tablet Take 1 tablet (100 mg total) by mouth daily as needed for erectile dysfunction. 90 tablet 3  . vitamin C (ASCORBIC ACID) 500 MG tablet Take 1 tablet (500 mg total) by mouth daily.     No current facility-administered medications on file prior to visit.     Allergies  Allergen Reactions  . Codeine Other (See Comments)    REACTION: chest pain  . Pioglitazone Other (See Comments)     REACTION to Actos: swelling in ankles    Family History  Problem Relation Age of Onset  . Cancer Mother        Breast Cancer  . Cancer Sister        Breast Cancer  . Diabetes Father   . Heart disease Father   .  Diabetes Paternal Grandfather     BP (!) 148/60 (BP Location: Right Arm, Patient Position: Sitting, Cuff Size: Large)   Pulse (!) 59   Ht 5\' 5"  (1.651 m)   Wt 268 lb 3.2 oz (121.7 kg)   SpO2 96%   BMI 44.63 kg/m    Review of Systems He denies hypoglycemia.  He has lost 3 lbs, since last ov.      Objective:   Physical Exam VITAL SIGNS:  See vs page GENERAL: no distress.      Pulses: dorsalis pedis intact bilat.   MSK: no deformity of the feet CV: 2+ bilat leg edema, and bilat vv's.  Skin:  no ulcer on the feet, but the skin is dry.  normal color and temp on the feet. Neuro: sensation is intact to touch on the feet  Lab Results  Component Value Date   HGBA1C 8.4 (A) 03/04/2018       Assessment & Plan:  HTN: is noted today Insulin-requiring type 2 DM, with PN: he needs increased rx Renal insuff: in this setting, he needs mostly mealtime insulin CAD: in this setting, he should avoid hypoglycemia, so we'll increase insulin slowly.  Patient Instructions  Your blood pressure is high today.  Please see your primary care provider soon, to have it rechecked.   Please increase the insulin to the numbers listed below.   check your blood sugar twice a day.  vary the time of day when you check, between before the 3 meals, and at bedtime.  also check if you have symptoms of your blood sugar being too high or too low.  please keep a record of the readings and bring it to your next appointment here (or you can bring the meter itself).  You can write it on any piece of paper.  please call us sooner if your blood sugar goes below 70, or if you have a lot of readings over 200.  Please come back for a follow-up appointment in 2 months.

## 2018-03-05 ENCOUNTER — Ambulatory Visit: Payer: Medicare Other

## 2018-03-10 ENCOUNTER — Other Ambulatory Visit: Payer: Self-pay | Admitting: Family Medicine

## 2018-03-10 NOTE — Telephone Encounter (Signed)
Eprescribed.

## 2018-03-10 NOTE — Telephone Encounter (Signed)
Name of Medication: Alprazolam Name of Pharmacy: [facility] or Written Date and Quantity: 12/09/17, #30 Last Office Visit and Type: 02/06/18, acute Next Office Visit and Type: 03/20/18, AWV Last Controlled Substance Agreement Date: none Last UDS: none

## 2018-03-12 ENCOUNTER — Ambulatory Visit: Payer: Medicare Other

## 2018-03-18 ENCOUNTER — Ambulatory Visit: Payer: Medicare Other

## 2018-03-18 ENCOUNTER — Other Ambulatory Visit (INDEPENDENT_AMBULATORY_CARE_PROVIDER_SITE_OTHER): Payer: Medicare Other | Admitting: Family Medicine

## 2018-03-18 ENCOUNTER — Other Ambulatory Visit (INDEPENDENT_AMBULATORY_CARE_PROVIDER_SITE_OTHER): Payer: Medicare Other

## 2018-03-18 ENCOUNTER — Other Ambulatory Visit: Payer: Medicare Other

## 2018-03-18 DIAGNOSIS — D510 Vitamin B12 deficiency anemia due to intrinsic factor deficiency: Secondary | ICD-10-CM | POA: Diagnosis not present

## 2018-03-18 DIAGNOSIS — N183 Chronic kidney disease, stage 3 (moderate): Secondary | ICD-10-CM

## 2018-03-18 DIAGNOSIS — E785 Hyperlipidemia, unspecified: Secondary | ICD-10-CM | POA: Diagnosis not present

## 2018-03-18 DIAGNOSIS — M1A9XX0 Chronic gout, unspecified, without tophus (tophi): Secondary | ICD-10-CM

## 2018-03-18 DIAGNOSIS — Z125 Encounter for screening for malignant neoplasm of prostate: Secondary | ICD-10-CM

## 2018-03-18 DIAGNOSIS — D509 Iron deficiency anemia, unspecified: Secondary | ICD-10-CM | POA: Diagnosis not present

## 2018-03-18 DIAGNOSIS — E1142 Type 2 diabetes mellitus with diabetic polyneuropathy: Secondary | ICD-10-CM | POA: Diagnosis not present

## 2018-03-18 DIAGNOSIS — E1122 Type 2 diabetes mellitus with diabetic chronic kidney disease: Secondary | ICD-10-CM

## 2018-03-18 DIAGNOSIS — E1165 Type 2 diabetes mellitus with hyperglycemia: Secondary | ICD-10-CM | POA: Diagnosis not present

## 2018-03-18 DIAGNOSIS — IMO0002 Reserved for concepts with insufficient information to code with codable children: Secondary | ICD-10-CM

## 2018-03-18 LAB — CBC WITH DIFFERENTIAL/PLATELET
Basophils Absolute: 0 10*3/uL (ref 0.0–0.1)
Basophils Relative: 0.6 % (ref 0.0–3.0)
EOS PCT: 4.4 % (ref 0.0–5.0)
Eosinophils Absolute: 0.3 10*3/uL (ref 0.0–0.7)
HCT: 39.4 % (ref 39.0–52.0)
Hemoglobin: 13.4 g/dL (ref 13.0–17.0)
Lymphocytes Relative: 26.3 % (ref 12.0–46.0)
Lymphs Abs: 2 10*3/uL (ref 0.7–4.0)
MCHC: 33.9 g/dL (ref 30.0–36.0)
MCV: 87.9 fl (ref 78.0–100.0)
MONO ABS: 0.4 10*3/uL (ref 0.1–1.0)
Monocytes Relative: 4.8 % (ref 3.0–12.0)
Neutro Abs: 4.8 10*3/uL (ref 1.4–7.7)
Neutrophils Relative %: 63.9 % (ref 43.0–77.0)
Platelets: 196 10*3/uL (ref 150.0–400.0)
RBC: 4.48 Mil/uL (ref 4.22–5.81)
RDW: 15.6 % — ABNORMAL HIGH (ref 11.5–15.5)
WBC: 7.6 10*3/uL (ref 4.0–10.5)

## 2018-03-18 LAB — LIPID PANEL
CHOLESTEROL: 103 mg/dL (ref 0–200)
HDL: 27.3 mg/dL — ABNORMAL LOW (ref >39.00)
LDL Cholesterol: 37 mg/dL (ref 0–99)
NonHDL: 75.25
Total CHOL/HDL Ratio: 4
Triglycerides: 190 mg/dL — ABNORMAL HIGH (ref 0.0–149.0)
VLDL: 38 mg/dL (ref 0.0–40.0)

## 2018-03-18 LAB — COMPREHENSIVE METABOLIC PANEL
ALBUMIN: 3.9 g/dL (ref 3.5–5.2)
ALT: 18 U/L (ref 0–53)
AST: 12 U/L (ref 0–37)
Alkaline Phosphatase: 107 U/L (ref 39–117)
BUN: 30 mg/dL — ABNORMAL HIGH (ref 6–23)
CHLORIDE: 101 meq/L (ref 96–112)
CO2: 31 mEq/L (ref 19–32)
Calcium: 9.1 mg/dL (ref 8.4–10.5)
Creatinine, Ser: 1.65 mg/dL — ABNORMAL HIGH (ref 0.40–1.50)
GFR: 41.42 mL/min — ABNORMAL LOW (ref >60.00)
Glucose, Bld: 223 mg/dL — ABNORMAL HIGH (ref 70–99)
Potassium: 4.3 mEq/L (ref 3.5–5.1)
Sodium: 143 mEq/L (ref 135–145)
Total Bilirubin: 0.9 mg/dL (ref 0.2–1.2)
Total Protein: 6.2 g/dL (ref 6.0–8.3)

## 2018-03-18 LAB — IBC PANEL
Iron: 63 ug/dL (ref 42–165)
Saturation Ratios: 20.9 % (ref 20.0–50.0)
Transferrin: 215 mg/dL (ref 212.0–360.0)

## 2018-03-18 LAB — VITAMIN D 25 HYDROXY (VIT D DEFICIENCY, FRACTURES): VITD: 9.15 ng/mL — ABNORMAL LOW (ref 30.00–100.00)

## 2018-03-18 LAB — HEMOGLOBIN A1C: Hgb A1c MFr Bld: 8.3 % — ABNORMAL HIGH (ref 4.6–6.5)

## 2018-03-18 LAB — VITAMIN B12: Vitamin B-12: 206 pg/mL — ABNORMAL LOW (ref 211–911)

## 2018-03-18 LAB — URIC ACID: Uric Acid, Serum: 5.7 mg/dL (ref 4.0–7.8)

## 2018-03-18 LAB — FERRITIN: Ferritin: 133.8 ng/mL (ref 22.0–322.0)

## 2018-03-18 LAB — PSA, MEDICARE: PSA: 0.76 ng/ml (ref 0.10–4.00)

## 2018-03-20 ENCOUNTER — Ambulatory Visit (INDEPENDENT_AMBULATORY_CARE_PROVIDER_SITE_OTHER): Payer: Medicare Other | Admitting: Family Medicine

## 2018-03-20 ENCOUNTER — Encounter: Payer: Self-pay | Admitting: Family Medicine

## 2018-03-20 VITALS — BP 140/64 | HR 63 | Temp 98.2°F | Ht 64.5 in | Wt 271.0 lb

## 2018-03-20 DIAGNOSIS — Z7189 Other specified counseling: Secondary | ICD-10-CM | POA: Diagnosis not present

## 2018-03-20 DIAGNOSIS — M1A9XX0 Chronic gout, unspecified, without tophus (tophi): Secondary | ICD-10-CM

## 2018-03-20 DIAGNOSIS — E538 Deficiency of other specified B group vitamins: Secondary | ICD-10-CM | POA: Diagnosis not present

## 2018-03-20 DIAGNOSIS — I10 Essential (primary) hypertension: Secondary | ICD-10-CM

## 2018-03-20 DIAGNOSIS — I739 Peripheral vascular disease, unspecified: Secondary | ICD-10-CM

## 2018-03-20 DIAGNOSIS — E559 Vitamin D deficiency, unspecified: Secondary | ICD-10-CM | POA: Diagnosis not present

## 2018-03-20 DIAGNOSIS — E1142 Type 2 diabetes mellitus with diabetic polyneuropathy: Secondary | ICD-10-CM

## 2018-03-20 DIAGNOSIS — Z23 Encounter for immunization: Secondary | ICD-10-CM

## 2018-03-20 DIAGNOSIS — N183 Chronic kidney disease, stage 3 unspecified: Secondary | ICD-10-CM

## 2018-03-20 DIAGNOSIS — E1121 Type 2 diabetes mellitus with diabetic nephropathy: Secondary | ICD-10-CM

## 2018-03-20 DIAGNOSIS — IMO0002 Reserved for concepts with insufficient information to code with codable children: Secondary | ICD-10-CM

## 2018-03-20 DIAGNOSIS — R0989 Other specified symptoms and signs involving the circulatory and respiratory systems: Secondary | ICD-10-CM

## 2018-03-20 DIAGNOSIS — D509 Iron deficiency anemia, unspecified: Secondary | ICD-10-CM | POA: Diagnosis not present

## 2018-03-20 DIAGNOSIS — D510 Vitamin B12 deficiency anemia due to intrinsic factor deficiency: Secondary | ICD-10-CM | POA: Diagnosis not present

## 2018-03-20 DIAGNOSIS — E785 Hyperlipidemia, unspecified: Secondary | ICD-10-CM | POA: Diagnosis not present

## 2018-03-20 DIAGNOSIS — Z0000 Encounter for general adult medical examination without abnormal findings: Secondary | ICD-10-CM | POA: Diagnosis not present

## 2018-03-20 DIAGNOSIS — E1122 Type 2 diabetes mellitus with diabetic chronic kidney disease: Secondary | ICD-10-CM | POA: Diagnosis not present

## 2018-03-20 DIAGNOSIS — R011 Cardiac murmur, unspecified: Secondary | ICD-10-CM

## 2018-03-20 DIAGNOSIS — E1165 Type 2 diabetes mellitus with hyperglycemia: Secondary | ICD-10-CM

## 2018-03-20 DIAGNOSIS — Z794 Long term (current) use of insulin: Secondary | ICD-10-CM

## 2018-03-20 DIAGNOSIS — I350 Nonrheumatic aortic (valve) stenosis: Secondary | ICD-10-CM | POA: Insufficient documentation

## 2018-03-20 MED ORDER — CYANOCOBALAMIN 1000 MCG/ML IJ SOLN
1000.0000 ug | Freq: Once | INTRAMUSCULAR | Status: AC
  Administered 2018-03-20: 1000 ug via INTRAMUSCULAR

## 2018-03-20 MED ORDER — VITAMIN D3 1.25 MG (50000 UT) PO TABS: 1.0000 | ORAL_TABLET | ORAL | 1 refills | Status: DC

## 2018-03-20 NOTE — Assessment & Plan Note (Signed)
Chronic, stable. Continue current regimen. Adequate BP

## 2018-03-20 NOTE — Assessment & Plan Note (Addendum)
Vit B12 low - restart shots for 6 months then reassess.

## 2018-03-20 NOTE — Assessment & Plan Note (Addendum)
Chronic, stable. Sees renal ([place]).

## 2018-03-20 NOTE — Assessment & Plan Note (Signed)
Vit B12 low - restart shots for 6 months then reassess.

## 2018-03-20 NOTE — Assessment & Plan Note (Signed)
rec start 50,000 IU weekly for 3-6 months then reassess.

## 2018-03-20 NOTE — Assessment & Plan Note (Signed)
I don't have records of echo 2012. Consider updated echo. Anticipate AS. ?referred bruit to carotids - will check carotid US.

## 2018-03-20 NOTE — Assessment & Plan Note (Signed)
Encouraged healthy diet and lifestyle changes to affect sustainable weight loss. Activity limited by disability.

## 2018-03-20 NOTE — Progress Notes (Signed)
BP 140/64 (BP Location: Left Arm, Patient Position: Sitting, Cuff Size: Large)   Pulse 63   Temp 98.2 F (36.8 C) (Oral)   Ht 5' 4.5" (1.638 m)   Wt 271 lb (122.9 kg)   SpO2 95%   BMI 45.80 kg/m    CC: AMW Subjective:    Patient ID: Donald [name], male    DOB: [birth year], 73 y.o.   MRN: [id number]  HPI: Donald [name] is a 73 y.o. male presenting on 03/20/2018 for Medicare Wellness   Did not see Katha Cabal this year our health advisor.   Hearing Screening Comments: Wears hearing aids in bilateral ears. Not wearing them today. Vision Screening Comments: Last eye exam, 04/2017  Fall risk screen - 1 fall without injury - fell out of bed this week.  Depression screen - passed  Preventative: Colon cancer screening - colonoscopy 2006 with hyperplastic polyps ([place]). cologuard normal 01/2016.  Prostate cancer screening - yearly PSA.  Lung cancer screening - not eligible Flu shot - yearly Td 04/2008 Pneumovax 2009, 2004, DUE. prevnar 2016 Zostavax - received twice 2011, 2013 Shingrix - discussed Advanced directive discussion - has at home. HCPOA would be wife. Asked to bring [name] a copy.  Seat belt use discussed Sunscreen use discussed. No changing moles on skin. Sees Dr Evorn Gong.  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 Activity: limited by medical problems Diet: good water, fruits/vegetables daily     Relevant past medical, surgical, family and social history reviewed and updated as indicated. Interim medical history since our last visit reviewed. Allergies and medications reviewed and updated. Outpatient Medications Prior to Visit  Medication Sig Dispense Refill  . allopurinol (ZYLOPRIM) 300 MG tablet TAKE ONE TABLET BY MOUTH DAILY 30 tablet 2  . ALPRAZolam (XANAX) 1 MG tablet TAKE 1 TABLET BY MOUTH AT BEDTIME AS NEEDED FOR SLEEP 30 tablet 1  .  amLODipine (NORVASC) 2.5 MG tablet Take 2.5 mg by mouth daily.    Marland Kitchen aspirin 81 MG chewable tablet Chew 81 mg by mouth daily.    . colchicine (COLCRYS) 0.6 MG tablet TAKE 1 TABLET BY MOUTH EVERY HOUR UNTIL SYMPTOMS ARE BETTER, MAX OF 6TABS PER DAY. STOP IF DIARRHEA (Patient taking differently: TAKE 1 TABLET BY MOUTH EVERY HOUR UNTIL SYMPTOMS ARE BETTER AS NEEDED, MAX OF 6TABS PER DAY. STOP IF DIARRHEA) 30 tablet 4  . furosemide (LASIX) 80 MG tablet Take 1 tablet (80 mg total) by mouth daily. 90 tablet 3  . gabapentin (NEURONTIN) 600 MG tablet Take 2 tablets (1,200 mg total) by mouth at bedtime. 180 tablet 3  . glucose blood (GE100 BLOOD GLUCOSE TEST) test strip TEST 3-4 TIMES DAILY AS DIRECTED 100 each 5  . HYDROcodone-acetaminophen (NORCO) 10-325 MG tablet Take 1 tablet by mouth every 6 (six) hours as needed for moderate pain. 30 tablet 0  . insulin NPH Human (NOVOLIN N) 100 UNIT/ML injection Inject 2.9 mLs (290 Units total) into the skin at bedtime. 90 mL 5  . insulin regular (NOVOLIN R RELION) 100 units/mL injection 3 times a day (just before each meal) 210-180-270 units. 210 mL 0  . losartan (COZAAR) 100 MG tablet TAKE ONE TABLET BY MOUTH AT BEDTIME 90 tablet 3  . lovastatin (MEVACOR) 40 MG tablet Take 2 tablets (80 mg total) by mouth at bedtime. 180 tablet 3  . Naphazoline-Pheniramine ([place] OP)  Place 1 drop into both eyes 2 (two) times daily as needed.    . nortriptyline (PAMELOR) 25 MG capsule Take 1 capsule (25 mg total) by mouth at bedtime. 30 capsule 3  . ondansetron (ZOFRAN) 4 MG tablet Take 1 tablet (4 mg total) by mouth every 4 (four) hours as needed for nausea. 20 tablet 0  . Sennosides-Docusate Sodium (STOOL SOFTENER & LAXATIVE PO) Take 2 tablets by mouth at bedtime.    . vitamin C (ASCORBIC ACID) 500 MG tablet Take 1 tablet (500 mg total) by mouth daily.    . sildenafil (VIAGRA) 100 MG tablet Take 1 tablet (100 mg total) by mouth daily as needed for erectile dysfunction. 90 tablet 3     No facility-administered medications prior to visit.      Per HPI unless specifically indicated in ROS section below Review of Systems Objective:    BP 140/64 (BP Location: Left Arm, Patient Position: Sitting, Cuff Size: Large)   Pulse 63   Temp 98.2 F (36.8 C) (Oral)   Ht 5' 4.5" (1.638 m)   Wt 271 lb (122.9 kg)   SpO2 95%   BMI 45.80 kg/m   Wt Readings from Last 3 Encounters:  03/20/18 271 lb (122.9 kg)  03/04/18 268 lb 3.2 oz (121.7 kg)  02/06/18 272 lb (123.4 kg)    Physical Exam Vitals signs and nursing note reviewed.  Constitutional:      General: He is not in acute distress.    Appearance: Normal appearance. He is well-developed. He is obese.     Comments: Walks with cane  HENT:     Head: Normocephalic and atraumatic.     Right Ear: Hearing, tympanic membrane, ear canal and external ear normal.     Left Ear: Hearing, tympanic membrane, ear canal and external ear normal.     Ears:     Comments: Did not bring hearing aides today.    Nose: Nose normal.     Mouth/Throat:     Mouth: Mucous membranes are moist.     Pharynx: Uvula midline. No oropharyngeal exudate or posterior oropharyngeal erythema.  Eyes:     General: No scleral icterus.    Conjunctiva/sclera: Conjunctivae normal.     Pupils: Pupils are equal, round, and reactive to light.  Neck:     Musculoskeletal: Normal range of motion and neck supple.     Vascular: Carotid bruit (?referred from heart murmur) present.  Cardiovascular:     Rate and Rhythm: Normal rate and regular rhythm.     Pulses: Normal pulses.          Radial pulses are 2+ on the right side and 2+ on the left side.     Heart sounds: Murmur (3/6 systolic at USB) present.  Pulmonary:     Effort: Pulmonary effort is normal. No respiratory distress.     Breath sounds: Normal breath sounds. No wheezing, rhonchi or rales.  Abdominal:     General: Bowel sounds are normal. There is no distension.     Palpations: Abdomen is soft. There is no  mass.     Tenderness: There is no abdominal tenderness. There is no guarding or rebound.  Musculoskeletal: Normal range of motion.  Lymphadenopathy:     Cervical: No cervical adenopathy.  Skin:    General: Skin is warm and dry.     Findings: No rash.  Neurological:     Mental Status: He is alert and oriented to person, place, and time.  Comments: CN grossly intact, station and gait intact Recall 3/3 Calculation 5/5 serial 7s  Psychiatric:        Behavior: Behavior normal.        Thought Content: Thought content normal.        Judgment: Judgment normal.       Results for orders placed or performed in visit on 03/18/18  PSA, Medicare  Result Value Ref Range   PSA 0.76 0.10 - 4.00 ng/ml  CBC with Differential/Platelet  Result Value Ref Range   WBC 7.6 4.0 - 10.5 K/uL   RBC 4.48 4.22 - 5.81 Mil/uL   Hemoglobin 13.4 13.0 - 17.0 g/dL   HCT 39.4 39.0 - 52.0 %   MCV 87.9 78.0 - 100.0 fl   MCHC 33.9 30.0 - 36.0 g/dL   RDW 15.6 (H) 11.5 - 15.5 %   Platelets 196.0 150.0 - 400.0 K/uL   Neutrophils Relative % 63.9 43.0 - 77.0 %   Lymphocytes Relative 26.3 12.0 - 46.0 %   Monocytes Relative 4.8 3.0 - 12.0 %   Eosinophils Relative 4.4 0.0 - 5.0 %   Basophils Relative 0.6 0.0 - 3.0 %   Neutro Abs 4.8 1.4 - 7.7 K/uL   Lymphs Abs 2.0 0.7 - 4.0 K/uL   Monocytes Absolute 0.4 0.1 - 1.0 K/uL   Eosinophils Absolute 0.3 0.0 - 0.7 K/uL   Basophils Absolute 0.0 0.0 - 0.1 K/uL  VITAMIN D 25 Hydroxy (Vit-D Deficiency, Fractures)  Result Value Ref Range   VITD 9.15 (L) 30.00 - 100.00 ng/mL  Ferritin  Result Value Ref Range   Ferritin 133.8 22.0 - 322.0 ng/mL  IBC panel  Result Value Ref Range   Iron 63 42 - 165 ug/dL   Transferrin 215.0 212.0 - 360.0 mg/dL   Saturation Ratios 20.9 20.0 - 50.0 %  Vitamin B12  Result Value Ref Range   Vitamin B-12 206 (L) 211 - 911 pg/mL  Uric acid  Result Value Ref Range   Uric Acid, Serum 5.7 4.0 - 7.8 mg/dL  Comprehensive metabolic panel  Result  Value Ref Range   Sodium 143 135 - 145 mEq/L   Potassium 4.3 3.5 - 5.1 mEq/L   Chloride 101 96 - 112 mEq/L   CO2 31 19 - 32 mEq/L   Glucose, Bld 223 (H) 70 - 99 mg/dL   BUN 30 (H) 6 - 23 mg/dL   Creatinine, Ser 1.65 (H) 0.40 - 1.50 mg/dL   Total Bilirubin 0.9 0.2 - 1.2 mg/dL   Alkaline Phosphatase 107 39 - 117 U/L   AST 12 0 - 37 U/L   ALT 18 0 - 53 U/L   Total Protein 6.2 6.0 - 8.3 g/dL   Albumin 3.9 3.5 - 5.2 g/dL   Calcium 9.1 8.4 - 10.5 mg/dL   GFR 41.42 (L) >60.00 mL/min  Lipid panel  Result Value Ref Range   Cholesterol 103 0 - 200 mg/dL   Triglycerides 190.0 (H) 0.0 - 149.0 mg/dL   HDL 27.30 (L) >39.00 mg/dL   VLDL 38.0 0.0 - 40.0 mg/dL   LDL Cholesterol 37 0 - 99 mg/dL   Total CHOL/HDL Ratio 4    NonHDL 75.25    Assessment & Plan:   Problem List Items Addressed This Visit    Vitamin D deficiency    rec start 50,000 IU weekly for 3-6 months then reassess.      Vitamin B12 deficiency    Vit B12 low - restart shots for 6  months then reassess.       Relevant Medications   cyanocobalamin ((VITAMIN B-12)) injection 1,000 mcg (Completed)   Uncontrolled type 2 diabetes mellitus with peripheral neuropathy ([place])    Appreciate endo care - seeing regularly.      Systolic murmur    I don't have records of echo 2012. Consider updated echo. Anticipate AS. ?referred bruit to carotids - will check carotid US.       Pernicious anemia    Vit B12 low - restart shots for 6 months then reassess.       Relevant Medications   cyanocobalamin ((VITAMIN B-12)) injection 1,000 mcg (Completed)   PAD (peripheral artery disease) (HCC)   Obesity, morbid, BMI 40.0-49.9 ([place])    Encouraged healthy diet and lifestyle changes to affect sustainable weight loss. Activity limited by disability.       Medicare annual wellness visit, subsequent - Primary    I have personally reviewed the Medicare Annual Wellness questionnaire and have noted 1. The patient's medical and social  history 2. Their use of alcohol, tobacco or illicit drugs 3. Their current medications and supplements 4. The patient's functional ability including ADL's, fall risks, home safety risks and hearing or visual impairment. Cognitive function has been assessed and addressed as indicated.  5. Diet and physical activity 6. Evidence for depression or mood disorders The patients weight, height, BMI have been recorded in the chart. I have made referrals, counseling and provided education to the patient based on review of the above and I have provided the pt with a written personalized care plan for preventive services. Provider list updated.. See scanned questionairre as needed for further documentation. Reviewed preventative protocols and updated unless pt declined.       Iron deficiency anemia    Iron levels remaining adequate off replacement - will continue to monitor.       Relevant Medications   cyanocobalamin ((VITAMIN B-12)) injection 1,000 mcg (Completed)   Essential hypertension    Chronic, stable. Continue current regimen. Adequate BP      Dyslipidemia    Chronic, stable. Continue current regimen.  The ASCVD Risk score Mikey Bussing DC Jr., et al., 2013) failed to calculate for the following reasons:   The valid total cholesterol range is 130 to 320 mg/dL       Diabetes mellitus with diabetic nephropathy, with long-term current use of insulin ([place])    Appreciate endo care - seeing regularly.      CKD stage 3 due to type 2 diabetes mellitus (HCC)    Chronic, stable. Sees renal ([place]).      Chronic gout    Urate well controlled on allopurinol 300mg  daily without recent flare.      Advanced care planning/counseling discussion    Advanced directive discussion - has at home. HCPOA would be wife. Asked to bring [name] a copy.        Other Visit Diagnoses    Bilateral carotid bruits       Relevant Orders   VAS US CAROTID   Need for 23-polyvalent pneumococcal polysaccharide  vaccine       Relevant Orders   Pneumococcal polysaccharide vaccine 23-valent greater than or equal to 2yo subcutaneous/IM (Completed)       Meds ordered this encounter  Medications  . Cholecalciferol (VITAMIN D3) 1.25 MG (50000 UT) TABS    Sig: Take 1 tablet by mouth once a week.    Dispense:  12 tablet    Refill:  1  .  cyanocobalamin ((VITAMIN B-12)) injection 1,000 mcg   Orders Placed This Encounter  Procedures  . Pneumococcal polysaccharide vaccine 23-valent greater than or equal to 2yo subcutaneous/IM    Follow up plan: Return in about 4 months (around 07/20/2018) for follow up visit.  Ria Bush, MD

## 2018-03-20 NOTE — Assessment & Plan Note (Addendum)
Urate well controlled on allopurinol 300mg  daily without recent flare.

## 2018-03-20 NOTE — Assessment & Plan Note (Signed)
Chronic, stable. Continue current regimen. The ASCVD Risk score (Goff DC Jr., et al., 2013) failed to calculate for the following reasons:   The valid total cholesterol range is 130 to 320 mg/dL  

## 2018-03-20 NOTE — Assessment & Plan Note (Signed)
Appreciate endo care - seeing regularly.

## 2018-03-20 NOTE — Patient Instructions (Addendum)
Pneumovax today. b12 shot today. Schedule monthly shots for next 6 months.  Sign request for colonoscopy records from Dr Earlean Shawl If interested, check with pharmacy about new 2 shot shingles series (shingrix).  Start vitamin D 50,000 units weekly sent to pharmacy.  We will check neck artery ultrasound.  Good to see you today, call us with questions Return as needed or in 4-6 months for follow up visit.   Health Maintenance After Age 71 After age 2, you are at a higher risk for certain long-term diseases and infections as well as injuries from falls. Falls are a major cause of broken bones and head injuries in people who are older than age 64. Getting regular preventive care can help to keep you healthy and well. Preventive care includes getting regular testing and making lifestyle changes as recommended by your health care provider. Talk with your health care provider about:  Which screenings and tests you should have. A screening is a test that checks for a disease when you have no symptoms.  A diet and exercise plan that is right for you. What should I know about screenings and tests to prevent falls? Screening and testing are the best ways to find a health problem early. Early diagnosis and treatment give you the best chance of managing medical conditions that are common after age 60. Certain conditions and lifestyle choices may make you more likely to have a fall. Your health care provider may recommend:  Regular vision checks. Poor vision and conditions such as cataracts can make you more likely to have a fall. If you wear glasses, make sure to get your prescription updated if your vision changes.  Medicine review. Work with your health care provider to regularly review all of the medicines you are taking, including over-the-counter medicines. Ask your health care provider about any side effects that may make you more likely to have a fall. Tell your health care provider if any medicines that  you take make you feel dizzy or sleepy.  Osteoporosis screening. Osteoporosis is a condition that causes the bones to get weaker. This can make the bones weak and cause them to break more easily.  Blood pressure screening. Blood pressure changes and medicines to control blood pressure can make you feel dizzy.  Strength and balance checks. Your health care provider may recommend certain tests to check your strength and balance while standing, walking, or changing positions.  Foot health exam. Foot pain and numbness, as well as not wearing proper footwear, can make you more likely to have a fall.  Depression screening. You may be more likely to have a fall if you have a fear of falling, feel emotionally low, or feel unable to do activities that you used to do.  Alcohol use screening. Using too much alcohol can affect your balance and may make you more likely to have a fall. What actions can I take to lower my risk of falls? General instructions  Talk with your health care provider about your risks for falling. Tell your health care provider if: ? You fall. Be sure to tell your health care provider about all falls, even ones that seem minor. ? You feel dizzy, sleepy, or off-balance.  Take over-the-counter and prescription medicines only as told by your health care provider. These include any supplements.  Eat a healthy diet and maintain a healthy weight. A healthy diet includes low-fat dairy products, low-fat (lean) meats, and fiber from whole grains, beans, and lots of fruits and  vegetables. Home safety  Remove any tripping hazards, such as rugs, cords, and clutter.  Install safety equipment such as grab bars in bathrooms and safety rails on stairs.  Keep rooms and walkways well-lit. Activity   Follow a regular exercise program to stay fit. This will help you maintain your balance. Ask your health care provider what types of exercise are appropriate for you.  If you need a cane or  walker, use it as recommended by your health care provider.  Wear supportive shoes that have nonskid soles. Lifestyle  Do not drink alcohol if your health care provider tells you not to drink.  If you drink alcohol, limit how much you have: ? 0-1 drink a day for women. ? 0-2 drinks a day for men.  Be aware of how much alcohol is in your drink. In the [place] one drink equals one typical bottle of beer (12 oz), one-half glass of wine (5 oz), or one shot of hard liquor (1 oz).  Do not use any products that contain nicotine or tobacco, such as cigarettes and e-cigarettes. If you need help quitting, ask your health care provider. Summary  Having a healthy lifestyle and getting preventive care can help to protect your health and wellness after age 28.  Screening and testing are the best way to find a health problem early and help you avoid having a fall. Early diagnosis and treatment give you the best chance for managing medical conditions that are more common for people who are older than age 35.  Falls are a major cause of broken bones and head injuries in people who are older than age 58. Take precautions to prevent a fall at home.  Work with your health care provider to learn what changes you can make to improve your health and wellness and to prevent falls. This information is not intended to replace advice given to you by your health care provider. Make sure you discuss any questions you have with your health care provider. Document Released: 11/13/2016 Document Revised: 11/13/2016 Document Reviewed: 11/13/2016 Elsevier Interactive Patient Education  2019 [employer].

## 2018-03-20 NOTE — Assessment & Plan Note (Signed)
Iron levels remaining adequate off replacement - will continue to monitor.

## 2018-03-20 NOTE — Assessment & Plan Note (Signed)
Advanced directive discussion - has at home. HCPOA would be wife. Asked to bring [name] a copy.

## 2018-03-20 NOTE — Assessment & Plan Note (Signed)

## 2018-03-25 ENCOUNTER — Other Ambulatory Visit: Payer: Self-pay

## 2018-03-25 ENCOUNTER — Ambulatory Visit: Payer: Medicare Other | Attending: Chiropractic Medicine

## 2018-03-25 DIAGNOSIS — M62830 Muscle spasm of back: Secondary | ICD-10-CM | POA: Insufficient documentation

## 2018-03-25 DIAGNOSIS — M545 Low back pain, unspecified: Secondary | ICD-10-CM

## 2018-03-25 DIAGNOSIS — G8929 Other chronic pain: Secondary | ICD-10-CM | POA: Diagnosis not present

## 2018-03-25 DIAGNOSIS — M6281 Muscle weakness (generalized): Secondary | ICD-10-CM | POA: Diagnosis not present

## 2018-03-25 NOTE — Therapy (Signed)
[facility] [address] [place], [address] Phone: [phone]   Fax:  [phone]  Physical Therapy Treatment  Patient Details  Name: Donald [name] MRN: [id number] Date of Birth: 16-Jun-1945 Referring Provider (PT): Ramos MD   Encounter Date: 03/25/2018  PT End of Session - 03/25/18 1401    Visit Number  7    Number of Visits  13    Date for PT Re-Evaluation  04/13/18    Authorization Type  1 / 10 Medicare    PT Start Time  4401    PT Stop Time  1350    PT Time Calculation (min)  45 min    Activity Tolerance  Patient tolerated treatment well    Behavior During Therapy  [facility] for tasks assessed/performed       Past Medical History:  Diagnosis Date  . ANEMIA-IRON DEFICIENCY 07/26/2006  . Anxiety   . ASTHMA 07/26/2006  . Asthma   . Back pain, chronic   . Bell's palsy   . Chronic kidney disease    stage 3 renal failure  . COLONIC POLYPS, HX OF 07/26/2006  . DEPRESSION 03/14/2009  . DIABETES MELLITUS, TYPE II 07/26/2006  . [place] DISEASE, LUMBAR 10/05/2007  . DVT 12/03/2007  . DYSLIPIDEMIA 04/13/2009  . Edema    ankle  . GERD 07/26/2006  . Gout   . Heart murmur   . History of kidney stones   . HYPERTENSION 07/26/2006  . INSOMNIA 08/21/2007  . Neuropathy   . OBSTRUCTIVE SLEEP APNEA 12/03/2007   Use C-PAP  . PERIPHERAL NEUROPATHY 07/26/2006  . Pernicious anemia 11/20/2006  . PULMONARY EMBOLISM 10/05/2007  . Renal insufficiency   . Shortness of breath dyspnea     Past Surgical History:  Procedure Laterality Date  . APPENDECTOMY  1968  . [place], 06/14/2008   Dr. Trenton Gammon at [facility] (06/10)  . CARDIAC CATHETERIZATION    . CARPAL TUNNEL RELEASE Right 10/2017  . CATARACT EXTRACTION W/PHACO Left 05/30/2014   Procedure: CATARACT EXTRACTION PHACO AND INTRAOCULAR LENS PLACEMENT (IOC);  Surgeon: Estill Cotta, MD;  Location: [facility] ORS;  Service: Ophthalmology;  Laterality: Left;  [name]  01:20 AP% 23.7 CDE 31.86  . LITHOTRIPSY     X 2  . PARATHYROIDECTOMY Right 04/18/2016   PARATHYROIDECTOMY for cyst [name], MD)  . SHOULDER ARTHROSCOPY W/ ROTATOR CUFF REPAIR Right   . TONSILLECTOMY      There were no vitals filed for this visit.  Subjective Assessment - 03/25/18 1358    Subjective  Patient reports increased pain along the L LE along the quadriceps. Patient states improvement with pain before the increase in pain and patient states he has been improving overall with his sleeping.    Pertinent History  Previous lumbar surgery (unknown type states possible fusion), multiple lumbar steroid injects, currently taking pain medication    Limitations  Sitting;Lifting;Standing;Walking    How long can you walk comfortably?  20-41min    Patient Stated Goals  To return to walking, playing with grandson    Currently in Pain?  Yes    Pain Score  3     Pain Location  Back   leg   Pain Orientation  Lower    Pain Descriptors / Indicators  Aching;Sharp    Pain Type  Chronic pain    Pain Onset  More than a month ago    Pain Frequency  Constant  TREATMENT Neuromuscular Reeducation  Pain comes from the brain Patient was educated on the concept of pain as an output of the brain, including nociception versus pain, inhibition and facilitation, and threat level using metaphors to promote deep learning   Focused on processes in chronic pain where our brains will create increased sensitivity to help protect [name] against injury/danger. Educated on the over sensitive alarm system.  Therapeutic Exercise  Knee extension with RTB -- x20 Standing knee flexion with a towel -- x 20 Hip abduction in standing with UE support with YTB- x 20  Hip extension in standing with UE support with YTB -- x 20  Hip ER in standing with UE support with YTB -- x 20  Knee flexion in standing with towel -- x 20  Educated on pain education and educated on how general pain processing works to  increase understanding and further facilitate the decrease in pain. Went over exercises to perform when waiting in line.     PT Education - 03/25/18 1401    Education Details  form/technique with exercise; PNE    Person(s) Educated  Patient    Methods  Explanation;Demonstration    Comprehension  Verbalized understanding;Returned demonstration          PT Long Term Goals - 03/03/18 1440      PT LONG TERM GOAL #1   Title  Patient will be able to walk >1 hour before requiring a sitting rest break to better be able to purchase groceries.    Baseline  44min before requiring sitting; 03/03/2018: 80min before requiring sitting rest break    Time  6    Period  Weeks    Status  On-going      PT LONG TERM GOAL #2   Title  Patient will be independent with HEP to continue benefits of therapy after discharge.     Baseline  Dependent with HEP; 03/03/2018: required moderate cueing to perform    Time  6    Period  Weeks    Status  On-going      PT LONG TERM GOAL #3   Title  Patient will be able to sleep throughout the night waking up <2 times to improve immunoresponse and decrease pain.    Baseline  wakes up >3 times each night; 03/03/2018: wakes 1-2 times per night    Time  6    Period  Weeks    Status  Achieved      PT LONG TERM GOAL #4   Title  Patient will have a worst pain of 3/10 in the past week to better be able to perform ADLs with less pain.    Baseline  10/10 pain; 03/03/2018: 7/10    Time  6    Period  Weeks    Status  On-going            Plan - 03/25/18 1402    Clinical Impression Statement  Patient demonstrates increased pain and spasms along the L quadriceps musculature with performing knee extension. Patient states improvement and states decreased pain at the end of the session with patient improving overall. Patint will benefit from further skilled therapy with focus on improving limitations to return to prior level of function.     Rehab Potential  Fair     Clinical Impairments Affecting Rehab Potential  (+) highly motivated (-) persistent pain    PT Frequency  2x / week    PT Duration  6 weeks    PT  Treatment/Interventions  Printmaker;Iontophoresis 4mg /ml Dexamethasone;Moist Heat;Traction;Ultrasound;Cryotherapy;Therapeutic exercise;Therapeutic activities;Balance training;Patient/family education;Neuromuscular re-education;Manual techniques;Dry needling;Scar mobilization;Passive range of motion;Joint Manipulations    PT Next Visit Plan  progress strengthening, PNE    PT Home Exercise Plan  see education section    Consulted and Agree with Plan of Care  Patient       Patient will benefit from skilled therapeutic intervention in order to improve the following deficits and impairments:  Abnormal gait, Pain, Increased fascial restricitons, Increased muscle spasms, Decreased strength, Decreased range of motion, Decreased endurance, Hypomobility, Decreased balance, Decreased activity tolerance, Difficulty walking, Obesity  Visit Diagnosis: Muscle weakness (generalized)  Chronic bilateral low back pain, unspecified whether sciatica present  Muscle spasm of back     Problem List Patient Active Problem List   Diagnosis Date Noted  . Medicare annual wellness visit, subsequent 03/20/2018  . Advanced care planning/counseling discussion 03/20/2018  . Vitamin D deficiency 03/20/2018  . Vitamin B12 deficiency 03/20/2018  . Systolic murmur 17/49/4496  . Diabetes mellitus with diabetic nephropathy, with long-term current use of insulin ([place]) 12/19/2017  . Constipation 12/19/2017  . Chronic gout 12/19/2017  . PAD (peripheral artery disease) ([place]) 05/07/2017  . Upper airway cough syndrome 08/21/2016  . S/P parathyroidectomy 04/18/2016  . Uncontrolled type 2 diabetes mellitus with peripheral neuropathy ([place]) 03/12/2016  . ECG abnormal 02/28/2016  . Back pain 10/09/2015  . Loss of hearing 05/30/2015  . CKD stage 3 due to type 2 diabetes  mellitus ([place]) 01/27/2014  . Pain in joint, ankle and foot 10/08/2013  . Obesity, morbid, BMI 40.0-49.9 ([place]) 03/08/2013  . Impotence of organic origin 03/11/2011  . CAD (coronary artery disease) 11/19/2010  . URINARY CALCULUS 03/26/2010  . HEMATURIA UNSPECIFIED 02/27/2010  . Dyslipidemia 04/13/2009  . ALLERGIC RHINITIS 04/13/2009  . DEPRESSION 03/14/2009  . PROTEINURIA, MILD 09/16/2008  . History of deep vein thrombosis (DVT) of lower extremity 12/03/2007  . OSA (obstructive sleep apnea) 12/03/2007  . History of pulmonary embolus (PE) 10/05/2007  . [place] DISEASE, LUMBAR 10/05/2007  . FOOT PAIN, LEFT 08/21/2007  . Chronic insomnia 08/21/2007  . Pernicious anemia 11/20/2006  . Iron deficiency anemia 07/26/2006  . Essential hypertension 07/26/2006  . Asthmatic bronchitis 07/26/2006  . GERD 07/26/2006  . COLONIC POLYPS, HX OF 07/26/2006    Blythe Stanford, PT DPT 03/25/2018, 2:06 PM  [facility] [address] [place], [address] Phone: [phone]   Fax:  [phone]  Name: Donald [name] MRN: [id number] Date of Birth: Jul 07, 1945

## 2018-03-26 ENCOUNTER — Other Ambulatory Visit: Payer: Self-pay

## 2018-03-27 ENCOUNTER — Ambulatory Visit (INDEPENDENT_AMBULATORY_CARE_PROVIDER_SITE_OTHER): Payer: Medicare Other

## 2018-03-27 DIAGNOSIS — R0989 Other specified symptoms and signs involving the circulatory and respiratory systems: Secondary | ICD-10-CM

## 2018-03-29 ENCOUNTER — Encounter: Payer: Self-pay | Admitting: Family Medicine

## 2018-03-29 DIAGNOSIS — I6523 Occlusion and stenosis of bilateral carotid arteries: Secondary | ICD-10-CM | POA: Insufficient documentation

## 2018-03-30 ENCOUNTER — Ambulatory Visit: Payer: Medicare Other

## 2018-03-30 ENCOUNTER — Other Ambulatory Visit: Payer: Self-pay

## 2018-03-30 DIAGNOSIS — G8929 Other chronic pain: Secondary | ICD-10-CM

## 2018-03-30 DIAGNOSIS — M62830 Muscle spasm of back: Secondary | ICD-10-CM

## 2018-03-30 DIAGNOSIS — M545 Low back pain, unspecified: Secondary | ICD-10-CM

## 2018-03-30 DIAGNOSIS — M6281 Muscle weakness (generalized): Secondary | ICD-10-CM | POA: Diagnosis not present

## 2018-03-30 NOTE — Therapy (Signed)
[facility] [address], [place], [address] Phone: [phone]   Fax:  [phone]  Physical Therapy Treatment  Patient Details  Name: Donald [name] MRN: [id number] Date of Birth: [birth year] Referring Provider (PT): Ramos MD   Encounter Date: 03/30/2018  PT End of Session - 03/30/18 1752    Visit Number  8    Number of Visits  13    Date for PT Re-Evaluation  04/13/18    Authorization Type  2 / 10 Medicare    PT Start Time  3545    PT Stop Time  1630    PT Time Calculation (min)  45 min    Activity Tolerance  Patient tolerated treatment well    Behavior During Therapy  [facility] for tasks assessed/performed       Past Medical History:  Diagnosis Date  . ANEMIA-IRON DEFICIENCY 07/26/2006  . Anxiety   . ASTHMA 07/26/2006  . Asthma   . Back pain, chronic   . Bell's palsy   . Chronic kidney disease    stage 3 renal failure  . COLONIC POLYPS, HX OF 07/26/2006  . DEPRESSION 03/14/2009  . DIABETES MELLITUS, TYPE II 07/26/2006  . [place] DISEASE, LUMBAR 10/05/2007  . DVT 12/03/2007  . DYSLIPIDEMIA 04/13/2009  . Edema    ankle  . GERD 07/26/2006  . Gout   . Heart murmur   . History of kidney stones   . HYPERTENSION 07/26/2006  . INSOMNIA 08/21/2007  . Neuropathy   . OBSTRUCTIVE SLEEP APNEA 12/03/2007   Use C-PAP  . PERIPHERAL NEUROPATHY 07/26/2006  . Pernicious anemia 11/20/2006  . PULMONARY EMBOLISM 10/05/2007  . Renal insufficiency   . Shortness of breath dyspnea     Past Surgical History:  Procedure Laterality Date  . APPENDECTOMY  1968  . [place], 06/14/2008   Dr. Trenton Gammon at [facility] (06/10)  . CARDIAC CATHETERIZATION    . CARPAL TUNNEL RELEASE Right 10/2017  . CATARACT EXTRACTION W/PHACO Left 05/30/2014   Procedure: CATARACT EXTRACTION PHACO AND INTRAOCULAR LENS PLACEMENT (IOC);  Surgeon: Estill Cotta, MD;  Location: [facility] ORS;  Service: Ophthalmology;  Laterality: Left;  [name]  01:20 AP% 23.7 CDE 31.86  . LITHOTRIPSY     X 2  . PARATHYROIDECTOMY Right 04/18/2016   PARATHYROIDECTOMY for cyst [name], MD)  . SHOULDER ARTHROSCOPY W/ ROTATOR CUFF REPAIR Right   . TONSILLECTOMY      There were no vitals filed for this visit.  Subjective Assessment - 03/30/18 1746    Subjective  Patient reports he was able to walk for longer periods compared to the previous sessions. Patient states the pain is improving overall.     Pertinent History  Previous lumbar surgery (unknown type states possible fusion), multiple lumbar steroid injects, currently taking pain medication    Limitations  Sitting;Lifting;Standing;Walking    How long can you walk comfortably?  20-76min    Patient Stated Goals  To return to walking, playing with grandson    Currently in Pain?  Yes    Pain Score  3     Pain Location  Back    Pain Orientation  Lower    Pain Descriptors / Indicators  Aching;Sharp    Pain Type  Chronic pain    Pain Onset  More than a month ago    Pain Frequency  Constant        TREATMENT Neuromuscular Reeducation   Pain comes from  the brain Patient was educated on the concept of pain as an output of the brain, including nociception versus pain, inhibition and facilitation, and threat level using metaphors to promote deep learning    Focused on processes in chronic pain where our brains will create increased sensitivity to help protect [name] against injury/danger. Educated on the over sensitive alarm system.   Therapeutic Exercise  Sit to stands from 14" chair Single leg stance - 6 x 10 Step over hurdles - x 6 hurdles x 6 Stepping over pad with balance stones underneath - x 10 without AD Lumbar extension in standing straight arm - x 20  Prayer stretch - x 20   Educated on pain education and educated on how general pain processing works to increase understanding and further facilitate the decrease in pain. Went over exercises to perform when waiting in line.     PT Education - 03/30/18 1751    Education Details  form/technique with exercise; PNE    Person(s) Educated  Patient    Methods  Demonstration;Explanation    Comprehension  Verbalized understanding;Returned demonstration          PT Long Term Goals - 03/03/18 1440      PT LONG TERM GOAL #1   Title  Patient will be able to walk >1 hour before requiring a sitting rest break to better be able to purchase groceries.    Baseline  75min before requiring sitting; 03/03/2018: 34min before requiring sitting rest break    Time  6    Period  Weeks    Status  On-going      PT LONG TERM GOAL #2   Title  Patient will be independent with HEP to continue benefits of therapy after discharge.     Baseline  Dependent with HEP; 03/03/2018: required moderate cueing to perform    Time  6    Period  Weeks    Status  On-going      PT LONG TERM GOAL #3   Title  Patient will be able to sleep throughout the night waking up <2 times to improve immunoresponse and decrease pain.    Baseline  wakes up >3 times each night; 03/03/2018: wakes 1-2 times per night    Time  6    Period  Weeks    Status  Achieved      PT LONG TERM GOAL #4   Title  Patient will have a worst pain of 3/10 in the past week to better be able to perform ADLs with less pain.    Baseline  10/10 pain; 03/03/2018: 7/10    Time  6    Period  Weeks    Status  On-going            Plan - 03/30/18 1752    Clinical Impression Statement  Reviewed questions about pain and reviewed how the pain procress applies to his current situation. Patient demonstrates improvement overall with ability to perform higher level balance activities with ability to perform stepping out motions to maintain balance. Although patient is improving, he continues to use a cane for balance although able to perform tandem ambulation and single leg stance with little to no difficulty. Patient demonstrates increase in pain after performing prolonged standing motions.  Patient will benefit from further skilled therapy to return to prior level of function.     Rehab Potential  Fair    Clinical Impairments Affecting Rehab Potential  (+) highly motivated (-) persistent pain  PT Frequency  2x / week    PT Duration  6 weeks    PT Treatment/Interventions  Electrical Stimulation;Iontophoresis 4mg /ml Dexamethasone;Moist Heat;Traction;Ultrasound;Cryotherapy;Therapeutic exercise;Therapeutic activities;Balance training;Patient/family education;Neuromuscular re-education;Manual techniques;Dry needling;Scar mobilization;Passive range of motion;Joint Manipulations    PT Next Visit Plan  progress strengthening, PNE    PT Home Exercise Plan  see education section    Consulted and Agree with Plan of Care  Patient       Patient will benefit from skilled therapeutic intervention in order to improve the following deficits and impairments:  Abnormal gait, Pain, Increased fascial restricitons, Increased muscle spasms, Decreased strength, Decreased range of motion, Decreased endurance, Hypomobility, Decreased balance, Decreased activity tolerance, Difficulty walking, Obesity  Visit Diagnosis: Muscle weakness (generalized)  Chronic bilateral low back pain, unspecified whether sciatica present  Muscle spasm of back     Problem List Patient Active Problem List   Diagnosis Date Noted  . Carotid stenosis, asymptomatic, bilateral 03/29/2018  . Medicare annual wellness visit, subsequent 03/20/2018  . Advanced care planning/counseling discussion 03/20/2018  . Vitamin D deficiency 03/20/2018  . Vitamin B12 deficiency 03/20/2018  . Systolic murmur 43/15/4008  . Diabetes mellitus with diabetic nephropathy, with long-term current use of insulin ([place]) 12/19/2017  . Constipation 12/19/2017  . Chronic gout 12/19/2017  . PAD (peripheral artery disease) ([place]) 05/07/2017  . Upper airway cough syndrome 08/21/2016  . S/P parathyroidectomy 04/18/2016  . Uncontrolled type 2 diabetes  mellitus with peripheral neuropathy ([place]) 03/12/2016  . ECG abnormal 02/28/2016  . Back pain 10/09/2015  . Loss of hearing 05/30/2015  . CKD stage 3 due to type 2 diabetes mellitus ([place]) 01/27/2014  . Pain in joint, ankle and foot 10/08/2013  . Obesity, morbid, BMI 40.0-49.9 ([place]) 03/08/2013  . Impotence of organic origin 03/11/2011  . CAD (coronary artery disease) 11/19/2010  . URINARY CALCULUS 03/26/2010  . HEMATURIA UNSPECIFIED 02/27/2010  . Dyslipidemia 04/13/2009  . ALLERGIC RHINITIS 04/13/2009  . DEPRESSION 03/14/2009  . PROTEINURIA, MILD 09/16/2008  . History of deep vein thrombosis (DVT) of lower extremity 12/03/2007  . OSA (obstructive sleep apnea) 12/03/2007  . History of pulmonary embolus (PE) 10/05/2007  . [place] DISEASE, LUMBAR 10/05/2007  . FOOT PAIN, LEFT 08/21/2007  . Chronic insomnia 08/21/2007  . Pernicious anemia 11/20/2006  . Iron deficiency anemia 07/26/2006  . Essential hypertension 07/26/2006  . Asthmatic bronchitis 07/26/2006  . GERD 07/26/2006  . COLONIC POLYPS, HX OF 07/26/2006    Blythe Stanford, PT DPT 03/30/2018, 5:57 PM  [facility] [address], [place], [address] Phone: [phone]   Fax:  [phone]  Name: Donald [name] MRN: [id number] Date of Birth: [birth year]

## 2018-04-01 ENCOUNTER — Ambulatory Visit: Payer: Medicare Other

## 2018-04-02 ENCOUNTER — Ambulatory Visit: Payer: Medicare Other

## 2018-04-08 ENCOUNTER — Ambulatory Visit: Payer: Medicare Other

## 2018-04-13 ENCOUNTER — Other Ambulatory Visit: Payer: Self-pay | Admitting: Family Medicine

## 2018-04-15 ENCOUNTER — Telehealth: Payer: Self-pay

## 2018-04-15 NOTE — Telephone Encounter (Signed)
Virtual Visit Pre-Appointment Phone Call  Steps For Call:  1. Confirm consent - "In the setting of the current Covid19 crisis, you are scheduled for a VIDEO visit with your provider on 04/24/2018 at 3:00pm.  Just as we do with many in-office visits, in order for you to participate in this visit, we must obtain consent.  If you'd like, I can send this to your mychart (if signed up) or email for you to review.  Otherwise, I can obtain your verbal consent now.  All virtual visits are billed to your insurance company just like a normal visit would be.  By agreeing to a virtual visit, we'd like you to understand that the technology does not allow for your provider to perform an examination, and thus may limit your provider's ability to fully assess your condition.  Finally, though the technology is pretty good, we cannot assure that it will always work on either your or our end, and in the setting of a video visit, we may have to convert it to a phone-only visit.  In either situation, we cannot ensure that we have a secure connection.  Are you willing to proceed?"  2. Give patient instructions for WebEx download to smartphone as below if video visit  3. Advise patient to be prepared with any vital sign or heart rhythm information, their current medicines, and a piece of paper and pen handy for any instructions they may receive the day of their visit  4. Inform patient they will receive a phone call 15 minutes prior to their appointment time (may be from unknown caller ID) so they should be prepared to answer  5. Confirm that appointment type is correct in Epic appointment notes (video vs telephone)    TELEPHONE CALL NOTE  Donald [name] has been deemed a candidate for a follow-up tele-health visit to limit community exposure during the Covid-19 pandemic. I spoke with the patient via phone to ensure availability of phone/video source, confirm preferred email & phone number, and discuss  instructions and expectations.  I reminded Donald [name] to be prepared with any vital sign and/or heart rhythm information that could potentially be obtained via home monitoring, at the time of his visit. I reminded Donald [name] to expect a phone call at the time of his visit if his visit.  Did the patient verbally acknowledge consent to treatment? YES  Janan Ridge, [place] 04/15/2018 11:49 AM CONSENT FOR TELE-HEALTH VISIT - PLEASE REVIEW  I hereby voluntarily request, consent and authorize [facility] and its employed or contracted physicians, physician assistants, nurse practitioners or other licensed health care professionals (the Practitioner), to provide me with telemedicine health care services (the Services") as deemed necessary by the treating Practitioner. I acknowledge and consent to receive the Services by the Practitioner via telemedicine. I understand that the telemedicine visit will involve communicating with the Practitioner through live audiovisual communication technology and the disclosure of certain medical information by electronic transmission. I acknowledge that I have been given the opportunity to request an in-person assessment or other available alternative prior to the telemedicine visit and am voluntarily participating in the telemedicine visit.  I understand that I have the right to withhold or withdraw my consent to the use of telemedicine in the course of my care at any time, without affecting my right to future care or treatment, and that the Practitioner or I may terminate the telemedicine visit at any time. I understand that I have the  right to inspect all information obtained and/or recorded in the course of the telemedicine visit and may receive copies of available information for a reasonable fee.  I understand that some of the potential risks of receiving the Services via telemedicine include:   Delay or interruption in medical evaluation due to  technological equipment failure or disruption;  Information transmitted may not be sufficient (e.g. poor resolution of images) to allow for appropriate medical decision making by the Practitioner; and/or   In rare instances, security protocols could fail, causing a breach of personal health information.  Furthermore, I acknowledge that it is my responsibility to provide information about my medical history, conditions and care that is complete and accurate to the best of my ability. I acknowledge that Practitioner's advice, recommendations, and/or decision may be based on factors not within their control, such as incomplete or inaccurate data provided by me or distortions of diagnostic images or specimens that may result from electronic transmissions. I understand that the practice of medicine is not an exact science and that Practitioner makes no warranties or guarantees regarding treatment outcomes. I acknowledge that I will receive a copy of this consent concurrently upon execution via email to the email address I last provided but may also request a printed copy by calling the office of [place].    I understand that my insurance will be billed for this visit.   I have read or had this consent read to me.  I understand the contents of this consent, which adequately explains the benefits and risks of the Services being provided via telemedicine.   I have been provided ample opportunity to ask questions regarding this consent and the Services and have had my questions answered to my satisfaction.  I give my informed consent for the services to be provided through the use of telemedicine in my medical care  By participating in this telemedicine visit I agree to the above.

## 2018-04-22 ENCOUNTER — Ambulatory Visit (INDEPENDENT_AMBULATORY_CARE_PROVIDER_SITE_OTHER): Payer: Medicare Other

## 2018-04-22 ENCOUNTER — Other Ambulatory Visit: Payer: Self-pay

## 2018-04-22 DIAGNOSIS — E538 Deficiency of other specified B group vitamins: Secondary | ICD-10-CM

## 2018-04-22 MED ORDER — CYANOCOBALAMIN 1000 MCG/ML IJ SOLN
1000.0000 ug | Freq: Once | INTRAMUSCULAR | Status: AC
  Administered 2018-04-22: 15:00:00 1000 ug via INTRAMUSCULAR

## 2018-04-22 NOTE — Progress Notes (Signed)
Pt given #2 of 6 monthly B12 injections in Left Deltoid. Pt tolerated well.

## 2018-04-24 ENCOUNTER — Telehealth: Payer: Self-pay

## 2018-04-24 ENCOUNTER — Telehealth: Payer: Medicare Other | Admitting: Cardiovascular Disease

## 2018-04-24 ENCOUNTER — Other Ambulatory Visit: Payer: Self-pay

## 2018-04-24 NOTE — Telephone Encounter (Signed)
lmov for patient to call back.  Would like to review how todays visit will go.  Would like to do a doxy trial to make sure video and audio works.

## 2018-04-27 ENCOUNTER — Telehealth: Payer: Self-pay

## 2018-04-27 NOTE — Telephone Encounter (Signed)
Virtual appt on 04/28/18 at 3 pm. ED precautions given; see team health phone note.

## 2018-04-27 NOTE — Telephone Encounter (Addendum)
plz schedule in office visit for tomorrow morning - will need to do ear exam.

## 2018-04-27 NOTE — Therapy (Signed)
[place] MAIN [facility] SERVICES [address] [place], [place], [address] Phone: [phone]   Fax:  [phone]  Patient Details  Name: Donald [name] MRN: [id number] Date of Birth: [birth year] Referring Provider:  No ref. provider found  Encounter Date: 04/27/2018  The [facility] outpatient clinics are closed at this time due to the COVID-19 epidemic. The patient was contacted in regards to their therapy services. The patient is in agreement that they are safe and consent to being on hold for therapy services until the [facility] outpatient facilities reopen. At which time, the patient will be contacted to schedule an appointment to resume therapy services.    Blythe Stanford, PT DPT 04/27/2018, 10:22 AM  [place] MAIN [facility] SERVICES [address] [place], [place], [address] Phone: [phone]   Fax:  [phone]

## 2018-04-27 NOTE — Telephone Encounter (Signed)
All timestamps contained within this report are represented as [place] Standard Time. CONFIDENTIALTY NOTICE: This fax transmission is intended only for the addressee. It contains information that is legally privileged, confidential or otherwise protected from use or disclosure. If you are not the intended recipient, you are strictly prohibited from reviewing, disclosing, copying using or disseminating any of this information or taking any action in reliance on or regarding this information. If you have received this fax in error, please notify us immediately by telephone so that we can arrange for its return to [name]. Phone: [phone], Toll-Free: [phone], Fax: [phone] Page: 1 of 2 Call Id: [id number] [place] Patient Name: Donald [name] Gender: Male DOB: 1945/11/30 Age: 73 Y 3 M 28 D Return Phone Number: [phone] (Primary), [phone] (Secondary) Address: City/State/Zip: [place] [address] Client [place] Night - Client Client Site [place] Physician Ria Bush - MD Contact Type Call Who Is Calling Patient / Member / Family / Caregiver Call Type Triage / Clinical Caller Name Lannette Donath Dobies Relationship To Patient Spouse Return Phone Number [phone] (Primary) Chief Complaint Earache Reason for Call Symptomatic / Request for [place] states husband has either a terrible ear or gland infection; could he be seen today or tomorrow; Both inside, outside and behind left ear down throat having severe pain. Took pain pill (for back) for it; Also sees Loanne Drilling for diabetes. Translation No Nurse Assessment Nurse: Kennith Center, RN, Suanne Marker Date/Time (Eastern Time): 04/26/2018 6:19:39 AM Confirm and document reason for call. If symptomatic, describe symptoms. ---Caller states that his  left ear is hurting and has for the past 3 days. He has taken a hydrocodone and has gotten no relief. He reports that it hurts in his ear, behind his ear and done to his shoulders. He has no other symptoms and does not feel like he has a fever. Has the patient had close contact with a person known or suspected to have the novel coronavirus illness OR traveled / lives in area with major community spread (including international travel) in the last 14 days from the onset of symptoms? * If Asymptomatic, screen for exposure and travel within the last 14 days. ---No Does the patient have any new or worsening symptoms? ---Yes Will a triage be completed? ---Yes Related visit to physician within the last 2 weeks? ---Yes Does the PT have any chronic conditions? (i.e. diabetes, asthma, this includes High risk factors for pregnancy, etc.) ---Yes List chronic conditions. ---Diabetic Is this a behavioral health or substance abuse call? ---No PLEASE NOTE: All timestamps contained within this report are represented as [place] Standard Time. CONFIDENTIALTY NOTICE: This fax transmission is intended only for the addressee. It contains information that is legally privileged, confidential or otherwise protected from use or disclosure. If you are not the intended recipient, you are strictly prohibited from reviewing, disclosing, copying using or disseminating any of this information or taking any action in reliance on or regarding this information. If you have received this fax in error, please notify us immediately by telephone so that we can arrange for its return to [name]. Phone: [phone], Toll-Free: [phone], Fax: [phone] Page: 2 of 2 Call Id: [id number] Guidelines Guideline Title Affirmed Question Affirmed Notes Nurse Date/Time Eilene Ghazi Time) Earache [1] SEVERE pain AND [2] not improved 2 hours after taking analgesic medication (e.g., ibuprofen or acetaminophen) Kennith Center, RN, Suanne Marker  04/26/2018 6:28:29  AM Disp. Time Eilene Ghazi Time) Disposition Final User 04/26/2018 6:43:41 AM See HCP within 4 Hours (or PCP triage) Yes Kennith Center, RN, Rosalyn Charters Disagree/Comply Comply Caller Understands Yes PreDisposition Go to Urgent Care/Walk-In Clinic Care Advice Given Per Guideline SEE HCP WITHIN 4 HOURS (OR PCP TRIAGE): * IF OFFICE WILL BE CLOSED AND NO PCP (PRIMARY CARE PROVIDER) SECOND-LEVEL TRIAGE: You need to be seen within the next 3 or 4 hours. A nearby Urgent Care Center [facility]) is often a good source of care. Another choice is to go to the ED. Go sooner if you become worse. PAIN MEDICINES: ACETAMINOPHEN (E.G., TYLENOL): * Take 650 mg (two 325 mg pills) by mouth every 4-6 hours as needed. Each Regular Strength Tylenol pill has 325 mg of acetaminophen. The most you should take each day is 3,250 mg (10 Regular Strength pills a day). IBUPROFEN (E.G., MOTRIN, ADVIL): * Take 400 mg (two 200 mg pills) by mouth every 6 hours as needed. CALL BACK IF: * You become worse. CARE ADVICE given per Earache (Adult) guideline. Referrals [facility] [place]

## 2018-04-28 ENCOUNTER — Other Ambulatory Visit: Payer: Self-pay

## 2018-04-28 ENCOUNTER — Ambulatory Visit (INDEPENDENT_AMBULATORY_CARE_PROVIDER_SITE_OTHER): Payer: Medicare Other | Admitting: Family Medicine

## 2018-04-28 ENCOUNTER — Encounter: Payer: Self-pay | Admitting: Family Medicine

## 2018-04-28 ENCOUNTER — Ambulatory Visit: Payer: Medicare Other | Admitting: Family Medicine

## 2018-04-28 VITALS — BP 136/62 | HR 57 | Temp 98.0°F | Ht 64.5 in | Wt 270.4 lb

## 2018-04-28 DIAGNOSIS — H9202 Otalgia, left ear: Secondary | ICD-10-CM | POA: Diagnosis not present

## 2018-04-28 MED ORDER — NEOMYCIN-POLYMYXIN-HC 3.5-10000-1 OT SOLN: 3.0000 [drp] | Freq: Three times a day (TID) | OTIC | 0 refills | Status: DC

## 2018-04-28 MED ORDER — FLUTICASONE PROPIONATE 50 MCG/ACT NA SUSP: 2.0000 | Freq: Every day | NASAL | 1 refills | Status: DC

## 2018-04-28 NOTE — Telephone Encounter (Addendum)
I spoke with pts wife and rescheduled appt for in office visit today at 2 pm (this was first appt pt could come into office today. Morey Hummingbird will cancel 04/28/18 at 3 PM virtual visit due to already arrived. FYI to Dr Darnell Level.

## 2018-04-28 NOTE — Patient Instructions (Addendum)
Ear overall looking ok. Possible external ear infection.  Treat with antibiotic ear drops. If no better, let us know for referral to ENT.  May also use flonase nasal steroid for possible sinus/allergy contribution.

## 2018-04-28 NOTE — Assessment & Plan Note (Signed)
Of unclear cause. Overall benign exam today. No signs of AOM. ?mild external otitis - will Rx cortisporin ear drops, will start flonase in case sinus/allergy component. Update if not improving with treatment, would consider ENT eval. Pt agrees with plan.

## 2018-04-28 NOTE — Progress Notes (Signed)
BP 136/62 (BP Location: Left Arm, Patient Position: Sitting, Cuff Size: Large)   Pulse (!) 57   Temp 98 F (36.7 C) (Oral)   Ht 5' 4.5" (1.638 m)   Wt 270 lb 6 oz (122.6 kg)   SpO2 95%   BMI 45.69 kg/m    CC: L ear pain Subjective:    Patient ID: Donald [name], male    DOB: 19-Mar-1945, 73 y.o.   MRN: [id number]  HPI: Donald [name] is a 73 y.o. male presenting on 04/28/2018 for Ear Pain (C/o left ear pain. Pain extends to behind and below ear. Says pain wakes him out of his sleep. Started 04/23/18. Tried heating pad. )    5-6d h/o L earache, with radiation down into neck, very tender to palpation.  Feeling swollen on the left. Pain worse at night, calms down during the day.  Treating with heating pad and hydrocodone. Mild tinnitus L ear last night.  No hearing changes. No fevers/chills, head congestion, coughing, ST. No rash. No nausea/vomiting, dizziness or vertigo. No chest pain.   No ear infection since childhood.      Relevant past medical, surgical, family and social history reviewed and updated as indicated. Interim medical history since our last visit reviewed. Allergies and medications reviewed and updated. Outpatient Medications Prior to Visit  Medication Sig Dispense Refill  . allopurinol (ZYLOPRIM) 300 MG tablet TAKE 1 TABLET BY MOUTH DAILY 30 tablet 5  . ALPRAZolam (XANAX) 1 MG tablet TAKE 1 TABLET BY MOUTH AT BEDTIME AS NEEDED FOR SLEEP 30 tablet 1  . amLODipine (NORVASC) 2.5 MG tablet Take 2.5 mg by mouth daily.    Marland Kitchen aspirin 81 MG chewable tablet Chew 81 mg by mouth daily.    . Cholecalciferol (VITAMIN D3) 1.25 MG (50000 UT) TABS Take 1 tablet by mouth once a week. 12 tablet 1  . colchicine (COLCRYS) 0.6 MG tablet TAKE 1 TABLET BY MOUTH EVERY HOUR UNTIL SYMPTOMS ARE BETTER, MAX OF 6TABS PER DAY. STOP IF DIARRHEA (Patient taking differently: TAKE 1 TABLET BY MOUTH EVERY HOUR UNTIL SYMPTOMS ARE BETTER AS NEEDED, MAX OF 6TABS PER DAY. STOP IF DIARRHEA) 30  tablet 4  . furosemide (LASIX) 80 MG tablet Take 1 tablet (80 mg total) by mouth daily. 90 tablet 3  . gabapentin (NEURONTIN) 600 MG tablet Take 2 tablets (1,200 mg total) by mouth at bedtime. 180 tablet 3  . glucose blood (GE100 BLOOD GLUCOSE TEST) test strip TEST 3-4 TIMES DAILY AS DIRECTED 100 each 5  . HYDROcodone-acetaminophen (NORCO) 10-325 MG tablet Take 1 tablet by mouth every 6 (six) hours as needed for moderate pain. 30 tablet 0  . insulin NPH Human (NOVOLIN N) 100 UNIT/ML injection Inject 2.9 mLs (290 Units total) into the skin at bedtime. 90 mL 5  . insulin regular (NOVOLIN R RELION) 100 units/mL injection 3 times a day (just before each meal) 210-180-270 units. 210 mL 0  . losartan (COZAAR) 100 MG tablet TAKE ONE TABLET BY MOUTH AT BEDTIME 90 tablet 3  . lovastatin (MEVACOR) 40 MG tablet Take 2 tablets (80 mg total) by mouth at bedtime. 180 tablet 3  . Naphazoline-Pheniramine (OPCON-A OP) Place 1 drop into both eyes 2 (two) times daily as needed.    . nortriptyline (PAMELOR) 25 MG capsule Take 1 capsule (25 mg total) by mouth at bedtime. 30 capsule 3  . ondansetron (ZOFRAN) 4 MG tablet Take 1 tablet (4 mg total) by mouth every 4 (four) hours as  needed for nausea. 20 tablet 0  . Sennosides-Docusate Sodium (STOOL SOFTENER & LAXATIVE PO) Take 2 tablets by mouth at bedtime.    . vitamin C (ASCORBIC ACID) 500 MG tablet Take 1 tablet (500 mg total) by mouth daily.     No facility-administered medications prior to visit.      Per HPI unless specifically indicated in ROS section below Review of Systems Objective:    BP 136/62 (BP Location: Left Arm, Patient Position: Sitting, Cuff Size: Large)   Pulse (!) 57   Temp 98 F (36.7 C) (Oral)   Ht 5' 4.5" (1.638 m)   Wt 270 lb 6 oz (122.6 kg)   SpO2 95%   BMI 45.69 kg/m   Wt Readings from Last 3 Encounters:  04/28/18 270 lb 6 oz (122.6 kg)  03/20/18 271 lb (122.9 kg)  03/04/18 268 lb 3.2 oz (121.7 kg)    Physical Exam Vitals signs  and nursing note reviewed.  Constitutional:      General: He is not in acute distress.    Appearance: Normal appearance. He is well-developed. He is obese.  HENT:     Head: Normocephalic and atraumatic.     Comments: No TMJ discomfort. No pain or mass at parotid glands. No pain at temporal region    Right Ear: Hearing, tympanic membrane, ear canal and external ear normal.     Left Ear: Hearing, tympanic membrane, ear canal and external ear normal.     Ears:     Comments: Slightly retracted L TM. Mild erythema of deep external canal on left. Pearly grey TMs bilaterally. R external ear scaly/dry. No pain at mastoids.     Nose: Nose normal. No mucosal edema, congestion or rhinorrhea.     Right Sinus: No maxillary sinus tenderness or frontal sinus tenderness.     Left Sinus: No maxillary sinus tenderness or frontal sinus tenderness.     Mouth/Throat:     Mouth: Mucous membranes are moist.     Pharynx: Uvula midline. No oropharyngeal exudate or posterior oropharyngeal erythema.     Tonsils: No tonsillar abscesses.     Comments: No tooth pain on left side Eyes:     General: No scleral icterus.    Conjunctiva/sclera: Conjunctivae normal.     Pupils: Pupils are equal, round, and reactive to light.  Neck:     Musculoskeletal: Normal range of motion and neck supple.  Cardiovascular:     Rate and Rhythm: Normal rate and regular rhythm.     Pulses: Normal pulses.     Heart sounds: Murmur (3/6 systolic) present.  Pulmonary:     Effort: Pulmonary effort is normal. No respiratory distress.     Breath sounds: Normal breath sounds. No wheezing, rhonchi or rales.  Lymphadenopathy:     Head:     Right side of head: No submandibular, tonsillar, preauricular or posterior auricular adenopathy.     Left side of head: No submental, submandibular, tonsillar, preauricular or posterior auricular adenopathy.     Cervical: No cervical adenopathy.  Skin:    General: Skin is warm and dry.     Findings: No  rash.  Neurological:     Mental Status: He is alert.       Lab Results  Component Value Date   HGBA1C 8.3 (H) 03/18/2018    Assessment & Plan:   Problem List Items Addressed This Visit    Left ear pain - Primary    Of unclear cause. Overall benign exam  today. No signs of AOM. ?mild external otitis - will Rx cortisporin ear drops, will start flonase in case sinus/allergy component. Update if not improving with treatment, would consider ENT eval. Pt agrees with plan.           Meds ordered this encounter  Medications  . neomycin-polymyxin-hydrocortisone (CORTISPORIN) OTIC solution    Sig: Place 3 drops into the left ear 3 (three) times daily.    Dispense:  10 mL    Refill:  0  . fluticasone (FLONASE) 50 MCG/ACT nasal spray    Sig: Place 2 sprays into both nostrils daily.    Dispense:  16 g    Refill:  1   No orders of the defined types were placed in this encounter.   Follow up plan: No follow-ups on file.  Ria Bush, MD

## 2018-04-28 NOTE — Telephone Encounter (Signed)
Noted  

## 2018-04-29 ENCOUNTER — Other Ambulatory Visit: Payer: Self-pay | Admitting: Family Medicine

## 2018-04-29 ENCOUNTER — Other Ambulatory Visit: Payer: Self-pay | Admitting: Endocrinology

## 2018-05-01 NOTE — Telephone Encounter (Signed)
Electronic refill request  Alprazolam LR 03/10/18 #30/1 Nortriptyline LR 12/19/17 #30/3 Last office visit 04/28/18 Upcoming appointment 07/31/18 UDS None Contract None

## 2018-05-01 NOTE — Telephone Encounter (Signed)
Eprescribed.

## 2018-05-04 ENCOUNTER — Ambulatory Visit: Payer: Medicare Other | Admitting: Endocrinology

## 2018-05-07 ENCOUNTER — Telehealth: Payer: Self-pay

## 2018-05-07 ENCOUNTER — Encounter: Payer: Self-pay | Admitting: Family Medicine

## 2018-05-07 ENCOUNTER — Other Ambulatory Visit: Payer: Self-pay

## 2018-05-07 ENCOUNTER — Telehealth (INDEPENDENT_AMBULATORY_CARE_PROVIDER_SITE_OTHER): Payer: Medicare Other | Admitting: Cardiovascular Disease

## 2018-05-07 DIAGNOSIS — I2510 Atherosclerotic heart disease of native coronary artery without angina pectoris: Secondary | ICD-10-CM | POA: Diagnosis not present

## 2018-05-07 DIAGNOSIS — E1122 Type 2 diabetes mellitus with diabetic chronic kidney disease: Secondary | ICD-10-CM | POA: Diagnosis not present

## 2018-05-07 DIAGNOSIS — E785 Hyperlipidemia, unspecified: Secondary | ICD-10-CM | POA: Diagnosis not present

## 2018-05-07 DIAGNOSIS — N183 Chronic kidney disease, stage 3 unspecified: Secondary | ICD-10-CM

## 2018-05-07 DIAGNOSIS — Z794 Long term (current) use of insulin: Secondary | ICD-10-CM | POA: Diagnosis not present

## 2018-05-07 DIAGNOSIS — I10 Essential (primary) hypertension: Secondary | ICD-10-CM | POA: Diagnosis not present

## 2018-05-07 DIAGNOSIS — I739 Peripheral vascular disease, unspecified: Secondary | ICD-10-CM

## 2018-05-07 NOTE — Telephone Encounter (Signed)
Called patient.  No answer. LMOV.  Need to obtain vitals.  Need to review medications.

## 2018-05-07 NOTE — Patient Instructions (Signed)

## 2018-05-07 NOTE — Progress Notes (Signed)
Virtual Visit via Telephone Note   This visit type was conducted due to national recommendations for restrictions regarding the COVID-19 Pandemic (e.g. social distancing) in an effort to limit this patient's exposure and mitigate transmission in our community.  Due to his co-morbid illnesses, this patient is at least at moderate risk for complications without adequate follow up.  This format is felt to be most appropriate for this patient at this time.  The patient did not have access to video technology/had technical difficulties with video requiring transitioning to audio format only (telephone).  All issues noted in this document were discussed and addressed.  No physical exam could be performed with this format.  Please refer to the patient's chart for his  consent to telehealth for [facility].   I connected with  Donald [name] on 05/07/18 by a video enabled telemedicine application and verified that I am speaking with the correct person using two identifiers. I discussed the limitations of evaluation and management by telemedicine. The patient expressed understanding and agreed to proceed.   Evaluation Performed:  Follow-up visit  Date:  05/07/2018   ID:  Donald [name], DOB December 25, 1945, MRN [id number]  Patient Location:  [address] [place] [address]   Provider location:   [facility], [place] office  PCP:  Donald Bush, MD  Cardiologist:  Arvid Right [facility]   Chief Complaint:  Ear pain, leg swelling    History of Present Illness:    Donald [name] is a 73 y.o. male who presents via audio/video conferencing for a telehealth visit today.   The patient does not symptoms concerning for COVID-19 infection (fever, chills, cough, or new SHORTNESS OF BREATH).   Patient has a past medical history of Long-standing diabetes, HBA1C 7.7 hyperlipidemia,  coronary artery disease,  Prior cardiac catheterization 2012 with 40% coronary disease  noted Diffuse three-vessel coronary calcification on CT scan PAD,   chronic kidney disease, hypercalcemia,   OSA on CPAP,  Surgery of his parathyroid Chronic back pain Who presents for follow-up of his coronary artery disease  Pain left ear Etiology unclear, going to ear nose throat  Continues to have Chronic back pain Epidural delayed, secondary to viral outbreak On pain meds  No chest pain, no regular exercise, denies having shortness of breath, walks with a cane Compliant with CPAP  Leg swelling, no TED hose Continues to take Lasix  Vitals reviewed BP 136/62,  Pulse 65-74 resp 16  Weight 268 Eating more as he is quarantined Diet discussed with him  Lab work reviewed Total chol 103, LDL 37 Cr 1.65, BUN 30 Low B12, Low Vit D Normal CBC HBA1C: 8.3  Discussed cardiac risk factors with him Nonsmoker, cholesterol well controlled on statin, lovastatin  Other past medical history reviewed Abnormal EKG leading to stress testing on prior clinic visit This showed no significant ischemia  Had parathyroid, surgery Now with vertigo Following surgery Mild improvement with meclizine, though still symptomatic   previous PET/CT imaging   mild to moderate calcified plaque in the carotid arteries right greater than left Mild to moderate plaque in the aortic arch Diffuse three-vessel coronary calcifications He does have mild to moderate plaquing in the branches off the mid to distal descending aorta  staghorn left renal calculus  Recent parathyroid spectscan  showed a focal abnormal sestamibi location suspicious for right inferior parathyroid adenoma. Nonspecific tracer localization within single normal-sized left axillary and right subpectoral lymph node.   elective parathyroidectomy  on 02/29/2016.    Prior CV studies:   The following studies were reviewed today:    Past Medical History:  Diagnosis Date  . ANEMIA-IRON DEFICIENCY 07/26/2006  . Anxiety   .  ASTHMA 07/26/2006  . Asthma   . Back pain, chronic   . Bell's palsy   . Chronic kidney disease    stage 3 renal failure  . COLONIC POLYPS, HX OF 07/26/2006  . DEPRESSION 03/14/2009  . DIABETES MELLITUS, TYPE II 07/26/2006  . [place] DISEASE, LUMBAR 10/05/2007  . DVT 12/03/2007  . DYSLIPIDEMIA 04/13/2009  . Edema    ankle  . GERD 07/26/2006  . Gout   . Heart murmur   . History of kidney stones   . HYPERTENSION 07/26/2006  . INSOMNIA 08/21/2007  . Neuropathy   . OBSTRUCTIVE SLEEP APNEA 12/03/2007   Use C-PAP  . PERIPHERAL NEUROPATHY 07/26/2006  . Pernicious anemia 11/20/2006  . PULMONARY EMBOLISM 10/05/2007  . Renal insufficiency   . Shortness of breath dyspnea    Past Surgical History:  Procedure Laterality Date  . APPENDECTOMY  1968  . [place], 06/14/2008   Dr. Trenton Gammon at [facility] (06/10)  . CARDIAC CATHETERIZATION    . CARPAL TUNNEL RELEASE Right 10/2017  . CATARACT EXTRACTION W/PHACO Left 05/30/2014   Procedure: CATARACT EXTRACTION PHACO AND INTRAOCULAR LENS PLACEMENT (IOC);  Surgeon: Estill Cotta, MD;  Location: [facility] ORS;  Service: Ophthalmology;  Laterality: Left;  [name] 01:20 AP% 23.7 CDE 31.86  . COLONOSCOPY  10/2002   HP, SSA, TA, rpt 3 yrs ([facility])  . COLONOSCOPY  01/2005   diverticulosis rpt 5 yrs ([facility]off)   . LITHOTRIPSY     X 2  . PARATHYROIDECTOMY Right 04/18/2016   PARATHYROIDECTOMY for cyst [name], MD)  . SHOULDER ARTHROSCOPY W/ ROTATOR CUFF REPAIR Right   . TONSILLECTOMY       No outpatient medications have been marked as taking for the 05/07/18 encounter (Appointment) with Minna Merritts, MD.     Allergies:   Codeine and Pioglitazone   Social History   Tobacco Use  . Smoking status: Never Smoker  . Smokeless tobacco: Never Used  Substance Use Topics  . Alcohol use: No  . Drug use: No     Current Outpatient Medications on File Prior to Visit  Medication Sig Dispense Refill  . allopurinol (ZYLOPRIM) 300 MG tablet TAKE 1  TABLET BY MOUTH DAILY 30 tablet 5  . ALPRAZolam (XANAX) 1 MG tablet TAKE ONE TABLET BY MOUTH AT BEDTIME AS NEEDED FOR SLEEP 30 tablet 0  . amLODipine (NORVASC) 2.5 MG tablet Take 2.5 mg by mouth daily.    Marland Kitchen aspirin 81 MG chewable tablet Chew 81 mg by mouth daily.    . Cholecalciferol (VITAMIN D3) 1.25 MG (50000 UT) TABS Take 1 tablet by mouth once a week. 12 tablet 1  . colchicine (COLCRYS) 0.6 MG tablet TAKE 1 TABLET BY MOUTH EVERY HOUR UNTIL SYMPTOMS ARE BETTER, MAX OF 6TABS PER DAY. STOP IF DIARRHEA (Patient taking differently: TAKE 1 TABLET BY MOUTH EVERY HOUR UNTIL SYMPTOMS ARE BETTER AS NEEDED, MAX OF 6TABS PER DAY. STOP IF DIARRHEA) 30 tablet 4  . fluticasone (FLONASE) 50 MCG/ACT nasal spray Place 2 sprays into both nostrils daily. 16 g 1  . furosemide (LASIX) 80 MG tablet Take 1 tablet (80 mg total) by mouth daily. 90 tablet 3  . gabapentin (NEURONTIN) 600 MG tablet Take 2 tablets (1,200 mg total) by mouth at bedtime. [place]  tablet 3  . glucose blood (GE100 BLOOD GLUCOSE TEST) test strip TEST 3-4 TIMES DAILY AS DIRECTED 100 each 5  . HYDROcodone-acetaminophen (NORCO) 10-325 MG tablet Take 1 tablet by mouth every 6 (six) hours as needed for moderate pain. 30 tablet 0  . insulin NPH Human (NOVOLIN N) 100 UNIT/ML injection Inject 2.9 mLs (290 Units total) into the skin at bedtime. 90 mL 5  . losartan (COZAAR) 100 MG tablet TAKE ONE TABLET BY MOUTH AT BEDTIME 90 tablet 3  . lovastatin (MEVACOR) 40 MG tablet Take 2 tablets (80 mg total) by mouth at bedtime. 180 tablet 3  . Naphazoline-Pheniramine (OPCON-A OP) Place 1 drop into both eyes 2 (two) times daily as needed.    . neomycin-polymyxin-hydrocortisone (CORTISPORIN) OTIC solution Place 3 drops into the left ear 3 (three) times daily. 10 mL 0  . nortriptyline (PAMELOR) 25 MG capsule TAKE 1 CAPSULE BY MOUTH AT BEDTIME 30 capsule 6  . NOVOLIN R RELION 100 UNIT/ML injection INJECT 200 UNITS INTO THE SKIN BEFORE BREAKFAST, INJECT 170 UNITS BEFORE  LUNCH, AND INJECT 260 UNITS BEFORE DINNER. (JUST BEFORE EACH MEAL) 210 mL 0  . ondansetron (ZOFRAN) 4 MG tablet Take 1 tablet (4 mg total) by mouth every 4 (four) hours as needed for nausea. 20 tablet 0  . Sennosides-Docusate Sodium (STOOL SOFTENER & LAXATIVE PO) Take 2 tablets by mouth at bedtime.    . vitamin C (ASCORBIC ACID) 500 MG tablet Take 1 tablet (500 mg total) by mouth daily.     No current facility-administered medications on file prior to visit.      Family Hx: The patient's family history includes Cancer in his mother and sister; Diabetes in his father and paternal grandfather; Heart disease in his father.  ROS:   Please see the history of present illness.    Review of Systems  Constitutional: Negative.   Respiratory: Negative.   Cardiovascular: Positive for leg swelling.  Gastrointestinal: Negative.   Musculoskeletal: Positive for back pain.  Neurological: Negative.   Psychiatric/Behavioral: Negative.   All other systems reviewed and are negative.     Labs/Other Tests and Data Reviewed:    Recent Labs: 10/21/2017: TSH 3.14 03/18/2018: ALT 18; BUN 30; Creatinine, Ser 1.65; Hemoglobin 13.4; Platelets 196.0; Potassium 4.3; Sodium 143   Recent Lipid Panel Lab Results  Component Value Date/Time   CHOL 103 03/18/2018 09:20 AM   TRIG 190.0 (H) 03/18/2018 09:20 AM   HDL 27.30 (L) 03/18/2018 09:20 AM   CHOLHDL 4 03/18/2018 09:20 AM   LDLCALC 37 03/18/2018 09:20 AM   LDLDIRECT 52.2 05/25/2009 09:37 AM    Wt Readings from Last 3 Encounters:  04/28/18 270 lb 6 oz (122.6 kg)  03/20/18 271 lb (122.9 kg)  03/04/18 268 lb 3.2 oz (121.7 kg)     Exam:    Vital Signs: Vital signs may also be detailed in the HPI There were no vitals taken for this visit.  Wt Readings from Last 3 Encounters:  04/28/18 270 lb 6 oz (122.6 kg)  03/20/18 271 lb (122.9 kg)  03/04/18 268 lb 3.2 oz (121.7 kg)   Temp Readings from Last 3 Encounters:  04/28/18 98 F (36.7 C) (Oral)  03/20/18  98.2 F (36.8 C) (Oral)  02/06/18 99.8 F (37.7 C) (Oral)   BP Readings from Last 3 Encounters:  04/28/18 136/62  03/20/18 140/64  03/04/18 (!) 148/60   Pulse Readings from Last 3 Encounters:  04/28/18 (!) 57  03/20/18 63  03/04/18 (!)  59     BP 136/62,  Pulse 65-74 resp 16  Well nourished, well developed male in no acute distress. Constitutional:  oriented to person, place, and time. No distress.    ASSESSMENT & PLAN:    Essential hypertension -  Blood pressure is well controlled on today's visit. No changes made to the medications.  Dyslipidemia -  Numbers at goal, no changes made Continue statin  Coronary artery disease involving native coronary artery of native heart with angina pectoris ([place]) Significant three-vessel coronary calcification seen on previous CT scan stress Myoview with no significant ischemia Prior cardiac catheterization in 2012 with known coronary disease mild in the left main, 40% in other vessels  Sedentary at baseline, denies anginal symptoms Stressed importance of aggressive diabetes control  Aortic atherosclerosis (HCC) Seen in the aortic arch and branches of the descending aorta Hemoglobin A1c continues to run high Stressed importance of losing weight LDL at goal  Morbid obesity ([place]) Stressed importance of low carbohydrate diet Unable to exercise secondary to chronic back and orthopedic issues  Type 2 diabetes mellitus without complication, without long-term current use of insulin (HCC) Hemoglobin A1c of 8 Discussed diet with him  Lower extremity edema Taking Lasix daily, likely exacerbating his chronic renal insufficiency Edema likely with contribution from venous insufficiency, obesity  COVID-19 Education: The signs and symptoms of COVID-19 were discussed with the patient and how to seek care for testing (follow up with PCP or arrange E-visit).  The importance of social distancing was discussed today.  Patient Risk:    After full review of this patients clinical status, I feel that they are at least moderate risk at this time.  Time:   Today, I have spent 25 minutes with the patient with telehealth technology discussing the cardiac and medical problems/diagnoses detailed above   10 min spent reviewing the chart prior to patient visit today   Medication Adjustments/Labs and Tests Ordered: Current medicines are reviewed at length with the patient today.  Concerns regarding medicines are outlined above.   Tests Ordered: No tests ordered   Medication Changes: No changes made   Disposition: Follow-up in 12 months   Signed, Ida Rogue, MD  05/07/2018 1:18 PM    [place] Office [address], [place], [place] [address]

## 2018-05-08 ENCOUNTER — Telehealth: Payer: Self-pay

## 2018-05-08 NOTE — Telephone Encounter (Signed)
Spoke with pt relaying message. Says ENT is eval is 05/14/18. Denies any fever and the ear drops helped with pain down inside ear.  But the pain behind ear and back of head is still bad. Pain seems to be worse later in the day and at night.

## 2018-05-08 NOTE — Telephone Encounter (Signed)
Don't recommend xray. When is ENT evaluation? Any fever or ear drainage? Did the ear drops help at all?

## 2018-05-08 NOTE — Telephone Encounter (Signed)
Patient states left outer ear is still painful and swollen. Has future appt with ENT. Inquired if PCP wanted him to get X-ray of ear prior to appt with ENT.

## 2018-05-11 ENCOUNTER — Ambulatory Visit: Payer: Medicare Other | Admitting: Cardiovascular Disease

## 2018-05-11 NOTE — Telephone Encounter (Signed)
Spoke with pt relaying Dr. G's message.  Verbalizes understanding.  

## 2018-05-11 NOTE — Telephone Encounter (Signed)
Will await ENT eval. Ensure no fever or ear drainage or hearing changes.

## 2018-05-14 DIAGNOSIS — H6123 Impacted cerumen, bilateral: Secondary | ICD-10-CM | POA: Diagnosis not present

## 2018-05-14 DIAGNOSIS — M26602 Left temporomandibular joint disorder, unspecified: Secondary | ICD-10-CM | POA: Diagnosis not present

## 2018-05-14 DIAGNOSIS — H9202 Otalgia, left ear: Secondary | ICD-10-CM | POA: Diagnosis not present

## 2018-05-27 ENCOUNTER — Ambulatory Visit: Payer: Medicare Other

## 2018-05-29 ENCOUNTER — Ambulatory Visit (INDEPENDENT_AMBULATORY_CARE_PROVIDER_SITE_OTHER): Payer: Medicare Other

## 2018-05-29 DIAGNOSIS — E538 Deficiency of other specified B group vitamins: Secondary | ICD-10-CM

## 2018-05-29 MED ORDER — CYANOCOBALAMIN 1000 MCG/ML IJ SOLN
1000.0000 ug | Freq: Once | INTRAMUSCULAR | Status: AC
  Administered 2018-05-29: 1000 ug via INTRAMUSCULAR

## 2018-05-29 NOTE — Progress Notes (Signed)
Per orders of Dr. Gutierrez, injection of vit B12 given by Mana Morison. Patient tolerated injection well.  

## 2018-06-04 ENCOUNTER — Other Ambulatory Visit: Payer: Self-pay

## 2018-06-04 DIAGNOSIS — E119 Type 2 diabetes mellitus without complications: Secondary | ICD-10-CM

## 2018-06-04 MED ORDER — INSULIN NPH (HUMAN) (ISOPHANE) 100 UNIT/ML ~~LOC~~ SUSP: 290.0000 [IU] | Freq: Every day | SUBCUTANEOUS | 5 refills | Status: DC

## 2018-06-09 ENCOUNTER — Ambulatory Visit: Payer: Medicare Other | Admitting: Endocrinology

## 2018-06-09 DIAGNOSIS — Z79891 Long term (current) use of opiate analgesic: Secondary | ICD-10-CM | POA: Diagnosis not present

## 2018-06-09 DIAGNOSIS — M5136 Other intervertebral disc degeneration, lumbar region: Secondary | ICD-10-CM | POA: Diagnosis not present

## 2018-06-15 ENCOUNTER — Ambulatory Visit: Payer: Medicare Other | Admitting: Endocrinology

## 2018-06-16 ENCOUNTER — Other Ambulatory Visit: Payer: Self-pay | Admitting: Family Medicine

## 2018-06-17 ENCOUNTER — Other Ambulatory Visit: Payer: Self-pay | Admitting: Endocrinology

## 2018-06-17 NOTE — Telephone Encounter (Signed)
Please forward refill request to pt's new primary care provider.  

## 2018-06-18 NOTE — Telephone Encounter (Signed)
Please review refill request and approve if appropriate. Thank you

## 2018-06-22 DIAGNOSIS — N189 Chronic kidney disease, unspecified: Secondary | ICD-10-CM | POA: Diagnosis not present

## 2018-06-22 DIAGNOSIS — M109 Gout, unspecified: Secondary | ICD-10-CM | POA: Diagnosis not present

## 2018-06-22 DIAGNOSIS — N183 Chronic kidney disease, stage 3 (moderate): Secondary | ICD-10-CM | POA: Diagnosis not present

## 2018-06-26 ENCOUNTER — Other Ambulatory Visit: Payer: Self-pay | Admitting: Family Medicine

## 2018-06-26 NOTE — Telephone Encounter (Signed)
Eprescribed.

## 2018-06-26 NOTE — Telephone Encounter (Signed)
Name of Medication: Alprazolam Name of Pharmacy: [facility] or [facility] Date and Quantity: 05/30/18, #30 Last Office Visit and Type: 04/28/18, acute Next Office Visit and Type: 07/31/18, 4 mo f/u Last Controlled Substance Agreement Date: none Last UDS: none

## 2018-06-29 ENCOUNTER — Other Ambulatory Visit: Payer: Self-pay | Admitting: Family Medicine

## 2018-06-30 ENCOUNTER — Ambulatory Visit (INDEPENDENT_AMBULATORY_CARE_PROVIDER_SITE_OTHER): Payer: Medicare Other

## 2018-06-30 ENCOUNTER — Other Ambulatory Visit: Payer: Self-pay

## 2018-06-30 DIAGNOSIS — E538 Deficiency of other specified B group vitamins: Secondary | ICD-10-CM

## 2018-06-30 MED ORDER — CYANOCOBALAMIN 1000 MCG/ML IJ SOLN
1000.0000 ug | Freq: Once | INTRAMUSCULAR | Status: AC
  Administered 2018-06-30: 1000 ug via INTRAMUSCULAR

## 2018-06-30 NOTE — Progress Notes (Signed)
Pt given monthly B12 in Left Deltoid. Pt tolerated well.

## 2018-07-10 ENCOUNTER — Other Ambulatory Visit: Payer: Self-pay

## 2018-07-14 ENCOUNTER — Ambulatory Visit: Payer: Medicare Other | Admitting: Endocrinology

## 2018-07-31 ENCOUNTER — Ambulatory Visit: Payer: Medicare Other | Admitting: Family Medicine

## 2018-08-04 ENCOUNTER — Other Ambulatory Visit: Payer: Self-pay | Admitting: Family Medicine

## 2018-08-04 ENCOUNTER — Encounter: Payer: Self-pay | Admitting: Endocrinology

## 2018-08-04 ENCOUNTER — Ambulatory Visit (INDEPENDENT_AMBULATORY_CARE_PROVIDER_SITE_OTHER): Payer: Medicare Other | Admitting: Endocrinology

## 2018-08-04 ENCOUNTER — Other Ambulatory Visit: Payer: Self-pay

## 2018-08-04 VITALS — BP 112/64 | HR 53 | Ht 64.5 in | Wt 273.2 lb

## 2018-08-04 DIAGNOSIS — N189 Chronic kidney disease, unspecified: Secondary | ICD-10-CM

## 2018-08-04 DIAGNOSIS — E1142 Type 2 diabetes mellitus with diabetic polyneuropathy: Secondary | ICD-10-CM

## 2018-08-04 DIAGNOSIS — M545 Low back pain: Secondary | ICD-10-CM | POA: Diagnosis not present

## 2018-08-04 DIAGNOSIS — IMO0002 Reserved for concepts with insufficient information to code with codable children: Secondary | ICD-10-CM

## 2018-08-04 DIAGNOSIS — E1122 Type 2 diabetes mellitus with diabetic chronic kidney disease: Secondary | ICD-10-CM | POA: Diagnosis not present

## 2018-08-04 DIAGNOSIS — E119 Type 2 diabetes mellitus without complications: Secondary | ICD-10-CM | POA: Diagnosis not present

## 2018-08-04 DIAGNOSIS — E1159 Type 2 diabetes mellitus with other circulatory complications: Secondary | ICD-10-CM

## 2018-08-04 LAB — POCT GLYCOSYLATED HEMOGLOBIN (HGB A1C): Hemoglobin A1C: 7.7 % — AB (ref 4.0–5.6)

## 2018-08-04 MED ORDER — INSULIN REGULAR HUMAN 100 UNIT/ML IJ SOLN: INTRAMUSCULAR | 0 refills | Status: DC

## 2018-08-04 MED ORDER — HUMULIN R U-500 (CONCENTRATED) 500 UNIT/ML ~~LOC~~ SOLN: SUBCUTANEOUS | 11 refills | Status: DC

## 2018-08-04 NOTE — Patient Instructions (Addendum)
Please increase the insulin to the numbers listed below.   I have sent a prescription to your pharmacy, for super-concentrated reg insulin, to see if this is cheaper, with your insurance.  check your blood sugar twice a day.  vary the time of day when you check, between before the 3 meals, and at bedtime.  also check if you have symptoms of your blood sugar being too high or too low.  please keep a record of the readings and bring it to your next appointment here (or you can bring the meter itself).  You can write it on any piece of paper.  please call us sooner if your blood sugar goes below 70, or if you have a lot of readings over 200.  Please come back for a follow-up appointment in 2 months.

## 2018-08-04 NOTE — Progress Notes (Signed)
Subjective:    Patient ID: Donald [name], male    DOB: [birth year], 73 y.o.   MRN: [id number]  HPI Pt returns for f/u of diabetes mellitus: DM type: Insulin-requiring type 2 Dx'ed: 9030 Complications: polyneuropathy, CAD and renal insufficiency.   Therapy: insulin since 1995 DKA: never.  Severe hypoglycemia: never.  Pancreatitis: never.  Other: he takes multiple daily injections; he takes human insulin, due to cost; he declines weight-loss surgery; he has severe insulin resistance; he declines pump; He often eats 2 meals per day, so he takes only 2 injections of novolog.  Interval history: He had 2 steroid injections into the lower back, 7 weeks ago.  Other than that, he says cbg varies from 81-168.  It is in general higher as the day goes on, but there is not much trend.   Past Medical History:  Diagnosis Date  . ANEMIA-IRON DEFICIENCY 07/26/2006  . Anxiety   . ASTHMA 07/26/2006  . Asthma   . Back pain, chronic   . Bell's palsy   . Chronic kidney disease    stage 3 renal failure  . COLONIC POLYPS, HX OF 07/26/2006  . DEPRESSION 03/14/2009  . DIABETES MELLITUS, TYPE II 07/26/2006  . [place] DISEASE, LUMBAR 10/05/2007  . DVT 12/03/2007  . DYSLIPIDEMIA 04/13/2009  . Edema    ankle  . GERD 07/26/2006  . Gout   . Heart murmur   . History of kidney stones   . HYPERTENSION 07/26/2006  . INSOMNIA 08/21/2007  . Neuropathy   . OBSTRUCTIVE SLEEP APNEA 12/03/2007   Use C-PAP  . PERIPHERAL NEUROPATHY 07/26/2006  . Pernicious anemia 11/20/2006  . PULMONARY EMBOLISM 10/05/2007  . Renal insufficiency   . Shortness of breath dyspnea     Past Surgical History:  Procedure Laterality Date  . APPENDECTOMY  1968  . [place], 06/14/2008   Dr. Trenton Gammon at [facility] (06/10)  . CARDIAC CATHETERIZATION    . CARPAL TUNNEL RELEASE Right 10/2017  . CATARACT EXTRACTION W/PHACO Left 05/30/2014   Procedure: CATARACT EXTRACTION PHACO AND INTRAOCULAR LENS PLACEMENT (IOC);  Surgeon: Estill Cotta, MD;  Location: [facility] ORS;  Service: Ophthalmology;  Laterality: Left;  [name] 01:20 AP% 23.7 CDE 31.86  . COLONOSCOPY  10/2002   HP, SSA, TA, rpt 3 yrs ([facility])  . COLONOSCOPY  01/2005   diverticulosis rpt 5 yrs ([facility])   . LITHOTRIPSY     X 2  . PARATHYROIDECTOMY Right 04/18/2016   PARATHYROIDECTOMY for cyst [name], MD)  . SHOULDER ARTHROSCOPY W/ ROTATOR CUFF REPAIR Right   . TONSILLECTOMY      Social History   Socioeconomic History  . Marital status: Married    Spouse name: Not on file  . Number of children: Not on file  . Years of education: Not on file  . Highest education level: Not on file  Occupational History  . Occupation: Disabled    Employer: [employer]  Social Needs  . Financial resource strain: Not on file  . Food insecurity    Worry: Not on file    Inability: Not on file  . Transportation needs    Medical: Not on file    Non-medical: Not on file  Tobacco Use  . Smoking status: Never Smoker  . Smokeless tobacco: Never Used  Substance and Sexual Activity  . Alcohol use: No  . Drug use: No  . Sexual activity: Not on file  Lifestyle  . Physical activity    Days per week:  Not on file    Minutes per session: Not on file  . Stress: Not on file  Relationships  . Social Herbalist on phone: Not on file    Gets together: Not on file    Attends religious service: Not on file    Active member of club or organization: Not on file    Attends meetings of clubs or organizations: Not on file    Relationship status: Not on file  . Intimate partner violence    Fear of current or ex partner: Not on file    Emotionally abused: Not on file    Physically abused: Not on file    Forced sexual activity: Not on file  Other Topics Concern  . 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   Activity is severely limited by medical problems   Diet is "good".   Never a smoker    Alcohol: none          Current Outpatient Medications on File Prior to Visit  Medication Sig Dispense Refill  . allopurinol (ZYLOPRIM) 300 MG tablet TAKE 1 TABLET BY MOUTH DAILY 30 tablet 5  . amLODipine (NORVASC) 2.5 MG tablet Take 2.5 mg by mouth daily.    Marland Kitchen aspirin 81 MG chewable tablet Chew 81 mg by mouth daily.    . Cholecalciferol (VITAMIN D3) 1.25 MG (50000 UT) TABS Take 1 tablet by mouth once a week. 12 tablet 1  . colchicine (COLCRYS) 0.6 MG tablet TAKE 1 TABLET BY MOUTH EVERY HOUR UNTIL SYMPTOMS ARE BETTER, MAX OF 6TABS PER DAY. STOP IF DIARRHEA (Patient taking differently: TAKE 1 TABLET BY MOUTH EVERY HOUR UNTIL SYMPTOMS ARE BETTER AS NEEDED, MAX OF 6TABS PER DAY. STOP IF DIARRHEA) 30 tablet 4  . fluticasone (FLONASE) 50 MCG/ACT nasal spray Place 2 sprays into both nostrils daily. 16 g 1  . furosemide (LASIX) 80 MG tablet Take 1 tablet (80 mg total) by mouth daily. 90 tablet 3  . gabapentin (NEURONTIN) 600 MG tablet Take 2 tablets (1,200 mg total) by mouth at bedtime. 180 tablet 3  . glucose blood (GE100 BLOOD GLUCOSE TEST) test strip TEST 3-4 TIMES DAILY AS DIRECTED 100 each 5  . HYDROcodone-acetaminophen (NORCO) 10-325 MG tablet Take 1 tablet by mouth every 6 (six) hours as needed for moderate pain. 30 tablet 0  . insulin NPH Human (HUMULIN N) 100 UNIT/ML injection Inject 2.9 mLs (290 Units total) into the skin at bedtime. 90 mL 5  . losartan (COZAAR) 100 MG tablet TAKE ONE TABLET BY MOUTH AT BEDTIME 90 tablet 3  . lovastatin (MEVACOR) 40 MG tablet Take 2 tablets (80 mg total) by mouth at bedtime. 180 tablet 3  . Naphazoline-Pheniramine (OPCON-A OP) Place 1 drop into both eyes 2 (two) times daily as needed.    . neomycin-polymyxin-hydrocortisone (CORTISPORIN) OTIC solution Place 3 drops into the left ear 3 (three) times daily. 10 mL 0  . nortriptyline (PAMELOR) 25 MG capsule TAKE 1 CAPSULE BY MOUTH AT BEDTIME 30 capsule 6  . ondansetron (ZOFRAN) 4 MG tablet Take 1 tablet (4 mg  total) by mouth every 4 (four) hours as needed for nausea. 20 tablet 0  . Sennosides-Docusate Sodium (STOOL SOFTENER & LAXATIVE PO) Take 2 tablets by mouth at bedtime.    . vitamin C (ASCORBIC ACID) 500 MG tablet Take 1 tablet (500 mg total) by mouth daily.  No current facility-administered medications on file prior to visit.     Allergies  Allergen Reactions  . Codeine Other (See Comments)    REACTION: chest pain  . Pioglitazone Other (See Comments)    REACTION to Actos: swelling in ankles    Family History  Problem Relation Age of Onset  . Cancer Mother        Breast Cancer  . Cancer Sister        Breast Cancer  . Diabetes Father   . Heart disease Father   . Diabetes Paternal Grandfather     BP 112/64 (BP Location: Left Arm, Patient Position: Sitting, Cuff Size: Large)   Pulse (!) 53   Ht 5' 4.5" (1.638 m)   Wt 273 lb 3.2 oz (123.9 kg)   SpO2 92%   BMI 46.17 kg/m    Review of Systems He denies hypoglycemia    Objective:   Physical Exam VITAL SIGNS:  See vs page GENERAL: no distress Pulses: dorsalis pedis intact bilat.   MSK: no deformity of the feet CV: 1+ bilat leg leg edema Skin:  no ulcer on the feet.  normal color and temp on the feet.  Neuro: sensation is intact to touch on the feet  Lab Results  Component Value Date   HGBA1C 7.7 (A) 08/04/2018   Lab Results  Component Value Date   CREATININE 1.65 (H) 03/18/2018   BUN 30 (H) 03/18/2018   NA 143 03/18/2018   K 4.3 03/18/2018   CL 101 03/18/2018   CO2 31 03/18/2018       Assessment & Plan:  Insulin-requiring type 2 DM, with CAD: he needs increased rx, if it can be done with a regimen that avoids or minimizes hypoglycemia. Low back pain: steroid injection is prob affecting a1c, so we'll increase insulin slightly Renal failure: in this context, reg is chosen for increase.    Patient Instructions  Please increase the insulin to the numbers listed below.   I have sent a prescription to your  pharmacy, for super-concentrated reg insulin, to see if this is cheaper, with your insurance.  check your blood sugar twice a day.  vary the time of day when you check, between before the 3 meals, and at bedtime.  also check if you have symptoms of your blood sugar being too high or too low.  please keep a record of the readings and bring it to your next appointment here (or you can bring the meter itself).  You can write it on any piece of paper.  please call us sooner if your blood sugar goes below 70, or if you have a lot of readings over 200.  Please come back for a follow-up appointment in 2 months.

## 2018-08-04 NOTE — Telephone Encounter (Signed)
Eprescribed.

## 2018-08-05 ENCOUNTER — Ambulatory Visit: Payer: Medicare Other | Admitting: Family Medicine

## 2018-08-07 DIAGNOSIS — N200 Calculus of kidney: Secondary | ICD-10-CM | POA: Diagnosis not present

## 2018-08-07 DIAGNOSIS — R1084 Generalized abdominal pain: Secondary | ICD-10-CM | POA: Diagnosis not present

## 2018-08-10 ENCOUNTER — Encounter: Payer: Self-pay | Admitting: Family Medicine

## 2018-08-10 ENCOUNTER — Ambulatory Visit (INDEPENDENT_AMBULATORY_CARE_PROVIDER_SITE_OTHER): Payer: Medicare Other | Admitting: Family Medicine

## 2018-08-10 ENCOUNTER — Ambulatory Visit: Payer: Self-pay | Admitting: *Deleted

## 2018-08-10 ENCOUNTER — Other Ambulatory Visit: Payer: Self-pay

## 2018-08-10 VITALS — BP 166/52 | HR 55 | Temp 98.4°F | Resp 20 | Ht 64.5 in | Wt 280.0 lb

## 2018-08-10 DIAGNOSIS — R5383 Other fatigue: Secondary | ICD-10-CM | POA: Insufficient documentation

## 2018-08-10 DIAGNOSIS — I10 Essential (primary) hypertension: Secondary | ICD-10-CM

## 2018-08-10 DIAGNOSIS — N183 Chronic kidney disease, stage 3 (moderate): Secondary | ICD-10-CM | POA: Diagnosis not present

## 2018-08-10 DIAGNOSIS — E559 Vitamin D deficiency, unspecified: Secondary | ICD-10-CM

## 2018-08-10 DIAGNOSIS — N200 Calculus of kidney: Secondary | ICD-10-CM

## 2018-08-10 DIAGNOSIS — R531 Weakness: Secondary | ICD-10-CM | POA: Insufficient documentation

## 2018-08-10 DIAGNOSIS — R600 Localized edema: Secondary | ICD-10-CM | POA: Diagnosis not present

## 2018-08-10 DIAGNOSIS — E1122 Type 2 diabetes mellitus with diabetic chronic kidney disease: Secondary | ICD-10-CM

## 2018-08-10 DIAGNOSIS — E538 Deficiency of other specified B group vitamins: Secondary | ICD-10-CM | POA: Diagnosis not present

## 2018-08-10 LAB — CBC WITH DIFFERENTIAL/PLATELET
Basophils Absolute: 0.1 10*3/uL (ref 0.0–0.1)
Basophils Relative: 0.7 % (ref 0.0–3.0)
Eosinophils Absolute: 0.3 10*3/uL (ref 0.0–0.7)
Eosinophils Relative: 3.7 % (ref 0.0–5.0)
HCT: 40.6 % (ref 39.0–52.0)
Hemoglobin: 13.4 g/dL (ref 13.0–17.0)
Lymphocytes Relative: 19.1 % (ref 12.0–46.0)
Lymphs Abs: 1.5 10*3/uL (ref 0.7–4.0)
MCHC: 33.1 g/dL (ref 30.0–36.0)
MCV: 88.3 fl (ref 78.0–100.0)
Monocytes Absolute: 0.5 10*3/uL (ref 0.1–1.0)
Monocytes Relative: 6.6 % (ref 3.0–12.0)
Neutro Abs: 5.3 10*3/uL (ref 1.4–7.7)
Neutrophils Relative %: 69.9 % (ref 43.0–77.0)
Platelets: 184 10*3/uL (ref 150.0–400.0)
RBC: 4.6 Mil/uL (ref 4.22–5.81)
RDW: 15.6 % — ABNORMAL HIGH (ref 11.5–15.5)
WBC: 7.6 10*3/uL (ref 4.0–10.5)

## 2018-08-10 LAB — COMPREHENSIVE METABOLIC PANEL
ALT: 13 U/L (ref 0–53)
AST: 11 U/L (ref 0–37)
Albumin: 4.1 g/dL (ref 3.5–5.2)
Alkaline Phosphatase: 87 U/L (ref 39–117)
BUN: 49 mg/dL — ABNORMAL HIGH (ref 6–23)
CO2: 34 mEq/L — ABNORMAL HIGH (ref 19–32)
Calcium: 9.8 mg/dL (ref 8.4–10.5)
Chloride: 100 mEq/L (ref 96–112)
Creatinine, Ser: 1.81 mg/dL — ABNORMAL HIGH (ref 0.40–1.50)
GFR: 37.18 mL/min — ABNORMAL LOW (ref >60.00)
Glucose, Bld: 167 mg/dL — ABNORMAL HIGH (ref 70–99)
Potassium: 5 mEq/L (ref 3.5–5.1)
Sodium: 142 mEq/L (ref 135–145)
Total Bilirubin: 1 mg/dL (ref 0.2–1.2)
Total Protein: 6.3 g/dL (ref 6.0–8.3)

## 2018-08-10 LAB — BRAIN NATRIURETIC PEPTIDE: Pro B Natriuretic peptide (BNP): 70 pg/mL (ref 0.0–100.0)

## 2018-08-10 LAB — VITAMIN D 25 HYDROXY (VIT D DEFICIENCY, FRACTURES): VITD: 65.24 ng/mL (ref 30.00–100.00)

## 2018-08-10 LAB — VITAMIN B12: Vitamin B-12: 335 pg/mL (ref 211–911)

## 2018-08-10 MED ORDER — ONDANSETRON HCL 4 MG PO TABS: 4.0000 mg | ORAL_TABLET | ORAL | 0 refills | Status: DC | PRN

## 2018-08-10 NOTE — Telephone Encounter (Signed)
Patient's wife is calling with concerns that her husband is having extreme fatigue and not feeling well. Wife is answering questions below: Patient was recently diagnosed with kidney stone and will have to have that removed.Patient has started on pain medication and is having some constipation issues which he is using Doculax for. Wife states patient has also stopped his physical therapy.  Reason for Disposition . [1] MODERATE weakness (i.e., interferes with work, school, normal activities) AND [2] cause unknown  (Exceptions: weakness with acute minor illness, or weakness from poor fluid intake)  Answer Assessment - Initial Assessment Questions 1. DESCRIPTION: "Describe how you are feeling."     "feels bad"  "Washed out" 2. SEVERITY: "How bad is it?"  "Can you stand and walk?"   - MILD - Feels weak or tired, but does not interfere with work, school or normal activities   - [place] to stand and walk; weakness interferes with work, school, or normal activities   - SEVERE - Unable to stand or walk     moderate 3. ONSET:  "When did the weakness begin?"     All week end 4. CAUSE: "What do you think is causing the weakness?"     Diagnosed with kidney stone recently, patient is in pain and taking pain medication- some of the time, fatigued 5. MEDICINES: "Have you recently started a new medicine or had a change in the amount of a medicine?"     Hydrocodone for pain 6. OTHER SYMPTOMS: "Do you have any other symptoms?" (e.g., chest pain, fever, cough, SOB, vomiting, diarrhea, bleeding, other areas of pain)     Fatigue, weakness, weakness in voice 7. PREGNANCY: "Is there any chance you are pregnant?" "When was your last menstrual period?"     n/a  Protocols used: WEAKNESS (GENERALIZED) AND FATIGUE-A-AH

## 2018-08-10 NOTE — Telephone Encounter (Signed)
Jane RN from [facility] calling to get in office appt for pt with fatigue. No covid screening done. I called and spoke with Mrs Fiorillo; no covid symptom, no travel and no known exposure to covid. pts wife said pt is having excessive fatigue; no other symptoms. BP 157/49 P 47. Mrs Igo scheduled appt with Dr Einar Pheasant today at 11 AM. ED precautions given. Pt wants to keep FU appt with Dr Darnell Level on 08/12/18. FYI to DR Einar Pheasant.

## 2018-08-10 NOTE — Patient Instructions (Addendum)
#  Constipation - would recommend trying a laxative like Magnesium Citrate  #Fatigue - we will get blood work today - keep appointment with Dr. Darnell Level in 2 days - would also recommend considering getting a repeat ultrasound of you heart but once the blood work is back and discuss further with Dr. Darnell Level

## 2018-08-10 NOTE — Telephone Encounter (Signed)
Seen in-clinic today. See note.

## 2018-08-10 NOTE — Assessment & Plan Note (Signed)
DDx not entirely clear. Many of his issues are chronic (narcotics, sleep apnea, bradycardia, constipation, known vit D and b12 deficiencies). He has a notable weight gain and possible worsening edema and last ECHO was 2012 - will get blood work to evaluate for infection, liver/kidney changes, and BNP to look for signs of heart function. Would consider ECHO pending results and pt with PCP f/u in 2 days. Discussed EKG, but with chronicity of bradycardia this seemed less likely.

## 2018-08-10 NOTE — Assessment & Plan Note (Signed)
Recently diagnosed and out of anti-emetic. Has not been taking through the weekend though opiate use has slightly increased which could be contributing to fatigue. Refill zofran to help with nausea until surgery.

## 2018-08-10 NOTE — Progress Notes (Signed)
Subjective:     Donald [name] is a 73 y.o. male presenting for Fatigue (getting worse since last week. )     HPI  #Fatigue - chronic issue for patient - worse over the weekend - currently on hydrocodone for a large kidney stone (planning procedure for removal) - ever since the kidney stone he feels his weakness and tiredness has gotten worse - also getting some nausea from the pain - ran out of zofran  Is on chronic pain and norco is the same a previous - managed by Dr. Nelva Bush - no dose increase - Norco 10-325 mg > every 8 hours - hx of severe back pain  - is taking it slightly more frequent, but   - constipation is worse even w/o change of dose  - constipation regimen: senna and docusate combo and then additional liquid ducolax - last BM was Friday  - fatigue - feels tired, still using CPAP  - Diabetes: CBG 120-140, well controlled, no high/lows  - HR is in the 50s - which is chronic - BP high - may be pain related  Weight gain - 7lbs since Friday, is on lasix for leg swelling   Review of Systems  Constitutional: Positive for fatigue. Negative for chills, diaphoresis and fever.  HENT: Positive for congestion (chronic every morning). Negative for sore throat.   Respiratory: Negative for cough and shortness of breath.   Cardiovascular: Positive for leg swelling. Negative for chest pain and palpitations.  Gastrointestinal: Positive for abdominal pain (kidney stone), constipation and nausea.  Genitourinary: Negative for difficulty urinating, dysuria and hematuria.  Musculoskeletal: Positive for back pain.  Neurological: Negative for dizziness, weakness, light-headedness, numbness and headaches.     Social History   Tobacco Use  Smoking Status Never Smoker  Smokeless Tobacco Never Used        Objective:    BP Readings from Last 3 Encounters:  08/10/18 (!) 166/52  08/04/18 112/64  04/28/18 136/62   Wt Readings from Last 3 Encounters:  08/10/18 280 lb  (127 kg)  08/04/18 273 lb 3.2 oz (123.9 kg)  04/28/18 270 lb 6 oz (122.6 kg)    BP (!) 166/52   Pulse (!) 55   Temp 98.4 F (36.9 C)   Resp 20   Ht 5' 4.5" (1.638 m)   Wt 280 lb (127 kg)   SpO2 97%   BMI 47.32 kg/m    Physical Exam Constitutional:      Appearance: Normal appearance. He is obese. He is not ill-appearing or diaphoretic.  HENT:     Right Ear: External ear normal.     Left Ear: External ear normal.     Nose: Nose normal.  Eyes:     General: No scleral icterus.    Extraocular Movements: Extraocular movements intact.     Conjunctiva/sclera: Conjunctivae normal.  Neck:     Musculoskeletal: Neck supple.     Comments: JVD difficult to visualize due to body habitus but possibly mildly elevated Cardiovascular:     Rate and Rhythm: Normal rate and regular rhythm.     Heart sounds: Murmur present.  Pulmonary:     Effort: Pulmonary effort is normal. No respiratory distress.     Breath sounds: Normal breath sounds. No rales.  Musculoskeletal:     Right lower leg: Edema present.     Left lower leg: Edema (b/l 1+ to knee) present.     Comments: Ambulates with cane  Skin:    General: Skin  is warm and dry.  Neurological:     Mental Status: He is alert. Mental status is at baseline.  Psychiatric:        Mood and Affect: Mood normal.           Assessment & Plan:   Problem List Items Addressed This Visit      Cardiovascular and Mediastinum   Essential hypertension   Relevant Orders   Comprehensive metabolic panel   CBC with Differential   Brain natriuretic peptide     Endocrine   CKD stage 3 due to type 2 diabetes mellitus ([place])   Relevant Orders   Comprehensive metabolic panel     Genitourinary   Kidney stone    Recently diagnosed and out of anti-emetic. Has not been taking through the weekend though opiate use has slightly increased which could be contributing to fatigue. Refill zofran to help with nausea until surgery.       Relevant  Medications   ondansetron (ZOFRAN) 4 MG tablet     Other   Vitamin D deficiency   Relevant Orders   Vitamin D, 25-hydroxy   Vitamin B12 deficiency - Primary   Relevant Orders   Vitamin B12   Other fatigue    DDx not entirely clear. Many of his issues are chronic (narcotics, sleep apnea, bradycardia, constipation, known vit D and b12 deficiencies). He has a notable weight gain and possible worsening edema and last ECHO was 2012 - will get blood work to evaluate for infection, liver/kidney changes, and BNP to look for signs of heart function. Would consider ECHO pending results and pt with PCP f/u in 2 days. Discussed EKG, but with chronicity of bradycardia this seemed less likely.        Other Visit Diagnoses    Bilateral edema of lower extremity       Relevant Orders   Comprehensive metabolic panel   Brain natriuretic peptide       Return if symptoms worsen or fail to improve.  Lesleigh Noe, MD

## 2018-08-11 ENCOUNTER — Telehealth: Payer: Self-pay

## 2018-08-11 ENCOUNTER — Other Ambulatory Visit (HOSPITAL_COMMUNITY): Payer: Self-pay | Admitting: Urology

## 2018-08-11 ENCOUNTER — Telehealth: Payer: Self-pay | Admitting: Cardiovascular Disease

## 2018-08-11 DIAGNOSIS — N200 Calculus of kidney: Secondary | ICD-10-CM

## 2018-08-11 NOTE — Telephone Encounter (Signed)
Left message for patient to call back  

## 2018-08-11 NOTE — Telephone Encounter (Signed)
° °  [facility] Pre-operative Risk Assessment    Request for surgical clearance:  1. What type of surgery is being performed? Left percutaneous Nephrolithotomy with Left nephrostomy tube placed prior to procedure  2. When is this surgery scheduled? 10/19/18  3. What type of clearance is required (medical clearance vs. Pharmacy clearance to hold med vs. Both)? both  4. Are there any medications that need to be held prior to surgery and how long? please advise on any blood thinners  5. Practice name and name of physician performing surgery? [facility] Dr. Annie Main Dahahlstedt  6. What is your office phone number [phone]   7.   What is your office fax number [phone]  8.   Anesthesia type (None, local, MAC, general) ? Not noted   Marykay Lex 08/11/2018, 4:52 PM  _________________________________________________________________   (provider comments below)

## 2018-08-12 ENCOUNTER — Ambulatory Visit (INDEPENDENT_AMBULATORY_CARE_PROVIDER_SITE_OTHER): Payer: Medicare Other | Admitting: Family Medicine

## 2018-08-12 ENCOUNTER — Other Ambulatory Visit: Payer: Self-pay

## 2018-08-12 ENCOUNTER — Encounter: Payer: Self-pay | Admitting: Family Medicine

## 2018-08-12 ENCOUNTER — Ambulatory Visit (INDEPENDENT_AMBULATORY_CARE_PROVIDER_SITE_OTHER)
Admission: RE | Admit: 2018-08-12 | Discharge: 2018-08-12 | Disposition: A | Discharge status: Home / Self Care | Payer: Medicare Other | Source: Ambulatory Visit | Attending: Family Medicine | Admitting: Family Medicine

## 2018-08-12 VITALS — BP 132/62 | HR 58 | Temp 98.6°F | Ht 64.5 in | Wt 276.2 lb

## 2018-08-12 DIAGNOSIS — IMO0002 Reserved for concepts with insufficient information to code with codable children: Secondary | ICD-10-CM

## 2018-08-12 DIAGNOSIS — E1122 Type 2 diabetes mellitus with diabetic chronic kidney disease: Secondary | ICD-10-CM | POA: Diagnosis not present

## 2018-08-12 DIAGNOSIS — N183 Chronic kidney disease, stage 3 (moderate): Secondary | ICD-10-CM | POA: Diagnosis not present

## 2018-08-12 DIAGNOSIS — Z01818 Encounter for other preprocedural examination: Secondary | ICD-10-CM

## 2018-08-12 DIAGNOSIS — E1165 Type 2 diabetes mellitus with hyperglycemia: Secondary | ICD-10-CM | POA: Diagnosis not present

## 2018-08-12 DIAGNOSIS — N200 Calculus of kidney: Secondary | ICD-10-CM

## 2018-08-12 DIAGNOSIS — F5104 Psychophysiologic insomnia: Secondary | ICD-10-CM | POA: Diagnosis not present

## 2018-08-12 DIAGNOSIS — I2510 Atherosclerotic heart disease of native coronary artery without angina pectoris: Secondary | ICD-10-CM

## 2018-08-12 DIAGNOSIS — D510 Vitamin B12 deficiency anemia due to intrinsic factor deficiency: Secondary | ICD-10-CM | POA: Diagnosis not present

## 2018-08-12 DIAGNOSIS — E559 Vitamin D deficiency, unspecified: Secondary | ICD-10-CM | POA: Diagnosis not present

## 2018-08-12 DIAGNOSIS — K5903 Drug induced constipation: Secondary | ICD-10-CM | POA: Diagnosis not present

## 2018-08-12 DIAGNOSIS — E538 Deficiency of other specified B group vitamins: Secondary | ICD-10-CM

## 2018-08-12 DIAGNOSIS — E1142 Type 2 diabetes mellitus with diabetic polyneuropathy: Secondary | ICD-10-CM | POA: Diagnosis not present

## 2018-08-12 DIAGNOSIS — R5383 Other fatigue: Secondary | ICD-10-CM

## 2018-08-12 MED ORDER — CYANOCOBALAMIN 1000 MCG/ML IJ SOLN
1000.0000 ug | Freq: Once | INTRAMUSCULAR | Status: AC
  Administered 2018-08-12: 1000 ug via INTRAMUSCULAR

## 2018-08-12 NOTE — Assessment & Plan Note (Addendum)
b12 shot today - continue monthly shots.

## 2018-08-12 NOTE — Telephone Encounter (Addendum)
   Primary Cardiologist:Timothy Rockey Situ, MD  Chart reviewed as part of pre-operative protocol coverage. Because of Donald [name]'s past medical history and time since last visit, he will require a follow-up visit in order to better assess preoperative cardiovascular risk. His last OV was in 04/2018 but this was a virtual visit, so last EKG is in 04/2017. He also recently saw PCP 08/10/18 with complaints of fatigue, weight gain and worsening edema with recommendation to consider echocardiogram. Given these findings, would recommend in-office visit to address above concerns and optimize prior to clearing for surgery. Decision on blood thinners will need to come from provider after it is determined whether any repeat testing is necessary.  Pre-op covering staff: - Please schedule appointment and call patient to inform them. - Please contact requesting surgeon's office via preferred method (i.e, phone, fax) to inform them of need for appointment prior to surgery.  Charlie Pitter, PA-C  08/12/2018, 8:40 AM

## 2018-08-12 NOTE — Assessment & Plan Note (Signed)
States diagnosed with large 3.3cm L kidney stone that will need to be removed. Upcoming planned percutaneous nephrolithotomy 10/2018 (Dahlstedt).

## 2018-08-12 NOTE — Assessment & Plan Note (Addendum)
States diagnosed with large 3.3cm L kidney stone that will need to be removed. Upcoming percutaneous nephrolithotomy 10/2018 (Dahlstedt), states needs medical clearance. Will check CXR. Recent labs reassuring except for worsened renal insufficiency. I have not yet received medical clearance. Recommend rpt renal panel closer to surgery date.  RCRI = 1, BNP 70.

## 2018-08-12 NOTE — Assessment & Plan Note (Signed)
Levels much better since starting vit D weekly supplement - continue.

## 2018-08-12 NOTE — Assessment & Plan Note (Signed)
B12 shot today. 

## 2018-08-12 NOTE — Assessment & Plan Note (Signed)
Acute worsening anticipate related to dehydration, this could have been contributing to fatigue earlier this week. Improving with better hydration status. Advised cut lasix in half for 2 days then return to regular 80mg  daily dose.

## 2018-08-12 NOTE — Assessment & Plan Note (Signed)
Improved with better hydration status.

## 2018-08-12 NOTE — Assessment & Plan Note (Signed)
Nortriptyline overly sedating - actually feels sleeping better off meds - will stop nortriptyline.

## 2018-08-12 NOTE — Assessment & Plan Note (Addendum)
Reviewed daily bowel regimen - needs this while he's on regular narcotic. Continue senna-docusate, add miralax PRN. He also has been using mag citrate with benefit.

## 2018-08-12 NOTE — Assessment & Plan Note (Addendum)
Appreciate endo care. Was unable to afford highly concentrated insulin.

## 2018-08-12 NOTE — Patient Instructions (Signed)
Ok to stop nortriptyline.  Decrease lasix dose to 1/2 tablet for 2 days then return to normal dosing.  Continue other medicines.  Return in 4 months for follow up visit.

## 2018-08-12 NOTE — Progress Notes (Signed)
This visit was conducted in person.  BP 132/62 (BP Location: Left Arm, Patient Position: Sitting, Cuff Size: Large)   Pulse (!) 58   Temp 98.6 F (37 C) (Temporal)   Ht 5' 4.5" (1.638 m)   Wt 276 lb 3 oz (125.3 kg)   SpO2 96%   BMI 46.68 kg/m    CC: 4 mo f/u visit Subjective:    Patient ID: Donald [name], male    DOB: 06-18-1945, 73 y.o.   MRN: [id number]  HPI: Donald [name] is a 73 y.o. male presenting on 08/12/2018 for Follow-up (Here for 4 mo f/u.)   Seen Monday by Dr Einar Pheasant for fatigue - labwork reviewed showing renal insufficiency (Cr 1.8), B12 335. He is overall feeling better from Monday. He did increase water intake since Monday. Zofran was also refilled. Improved constipation with Mg citrate - also continues senna-docusate 2 at bedtime. Will try some of wife's miralax.   Currently taking more hydrocodone for large 3.3cm L kidney stone (planned intervention later this year) - 10/2018. May need medical clearance. Managing pain with higher hydrocodone and heating pad. Also having some L leg pain down to knee.   Continues lasix 80mg  daily.  Ha stopped nortriptyline 25mg  - oversedation the next morning. Will stop.   DM - managed by endo Loanne Drilling) last seen 7/21 - severe insulin resistance on huge dose insulin, to price out super concentrated regular insulin (500u/mL) - this was unaffordable.   Lab Results  Component Value Date   HGBA1C 7.7 (A) 08/04/2018        Relevant past medical, surgical, family and social history reviewed and updated as indicated. Interim medical history since our last visit reviewed. Allergies and medications reviewed and updated. Outpatient Medications Prior to Visit  Medication Sig Dispense Refill  . allopurinol (ZYLOPRIM) 300 MG tablet TAKE 1 TABLET BY MOUTH DAILY 30 tablet 5  . ALPRAZolam (XANAX) 1 MG tablet TAKE ONE TABLET BY MOUTH AT BEDTIME AS NEEDED FOR SLEEP 30 tablet 0  . amLODipine (NORVASC) 2.5 MG tablet Take 2.5 mg by mouth  daily.    Marland Kitchen aspirin 81 MG chewable tablet Chew 81 mg by mouth daily.    . Cholecalciferol (VITAMIN D3) 1.25 MG (50000 UT) TABS Take 1 tablet by mouth once a week. 12 tablet 1  . colchicine (COLCRYS) 0.6 MG tablet TAKE 1 TABLET BY MOUTH EVERY HOUR UNTIL SYMPTOMS ARE BETTER, MAX OF 6TABS PER DAY. STOP IF DIARRHEA (Patient taking differently: TAKE 1 TABLET BY MOUTH EVERY HOUR UNTIL SYMPTOMS ARE BETTER AS NEEDED, MAX OF 6TABS PER DAY. STOP IF DIARRHEA) 30 tablet 4  . fluticasone (FLONASE) 50 MCG/ACT nasal spray Place 2 sprays into both nostrils daily. 16 g 1  . furosemide (LASIX) 80 MG tablet Take 1 tablet (80 mg total) by mouth daily. 90 tablet 3  . gabapentin (NEURONTIN) 600 MG tablet Take 2 tablets (1,200 mg total) by mouth at bedtime. 180 tablet 3  . glucose blood (GE100 BLOOD GLUCOSE TEST) test strip TEST 3-4 TIMES DAILY AS DIRECTED 100 each 5  . HYDROcodone-acetaminophen (NORCO) 10-325 MG tablet Take 1 tablet by mouth every 6 (six) hours as needed for moderate pain. 30 tablet 0  . insulin NPH Human (HUMULIN N) 100 UNIT/ML injection Inject 2.9 mLs (290 Units total) into the skin at bedtime. 90 mL 5  . insulin regular (NOVOLIN R RELION) 100 units/mL injection INJECT 210 UNITS INTO THE SKIN BEFORE BREAKFAST, INJECT 170 UNITS BEFORE LUNCH, AND INJECT  270 UNITS BEFORE DINNER. (JUST BEFORE EACH MEAL) 210 mL 0  . insulin regular human CONCENTRATED (HUMULIN R) 500 UNIT/ML injection 210 UNITS INTO THE SKIN BEFORE BREAKFAST, INJECT 170 UNITS BEFORE LUNCH, AND INJECT 270 UNITS BEFORE DINNER. 40 mL 11  . losartan (COZAAR) 100 MG tablet TAKE ONE TABLET BY MOUTH AT BEDTIME 90 tablet 3  . lovastatin (MEVACOR) 40 MG tablet Take 2 tablets (80 mg total) by mouth at bedtime. 180 tablet 3  . Naphazoline-Pheniramine (OPCON-A OP) Place 1 drop into both eyes 2 (two) times daily as needed.    . neomycin-polymyxin-hydrocortisone (CORTISPORIN) OTIC solution Place 3 drops into the left ear 3 (three) times daily. 10 mL 0  .  ondansetron (ZOFRAN) 4 MG tablet Take 1 tablet (4 mg total) by mouth every 4 (four) hours as needed for nausea. 10 tablet 0  . Sennosides-Docusate Sodium (STOOL SOFTENER & LAXATIVE PO) Take 2 tablets by mouth at bedtime.    . vitamin C (ASCORBIC ACID) 500 MG tablet Take 1 tablet (500 mg total) by mouth daily.    . nortriptyline (PAMELOR) 25 MG capsule TAKE 1 CAPSULE BY MOUTH AT BEDTIME 30 capsule 6   No facility-administered medications prior to visit.      Per HPI unless specifically indicated in ROS section below Review of Systems Objective:    BP 132/62 (BP Location: Left Arm, Patient Position: Sitting, Cuff Size: Large)   Pulse (!) 58   Temp 98.6 F (37 C) (Temporal)   Ht 5' 4.5" (1.638 m)   Wt 276 lb 3 oz (125.3 kg)   SpO2 96%   BMI 46.68 kg/m   Wt Readings from Last 3 Encounters:  08/12/18 276 lb 3 oz (125.3 kg)  08/10/18 280 lb (127 kg)  08/04/18 273 lb 3.2 oz (123.9 kg)    Physical Exam Vitals signs and nursing note reviewed.  Constitutional:      Appearance: Normal appearance. He is obese. He is not ill-appearing.  HENT:     Head: Normocephalic and atraumatic.     Mouth/Throat:     Mouth: Mucous membranes are moist.     Pharynx: No posterior oropharyngeal erythema.  Eyes:     Extraocular Movements: Extraocular movements intact.     Pupils: Pupils are equal, round, and reactive to light.  Cardiovascular:     Rate and Rhythm: Normal rate and regular rhythm.     Pulses: Normal pulses.     Heart sounds: Murmur (2/6 systolic) present.  Pulmonary:     Effort: Pulmonary effort is normal. No respiratory distress.     Breath sounds: Normal breath sounds. No wheezing, rhonchi or rales.  Musculoskeletal:     Right lower leg: Edema (tr) present.     Left lower leg: Edema (tr) present.  Skin:    General: Skin is warm and dry.     Findings: No rash.  Neurological:     Mental Status: He is alert.  Psychiatric:        Mood and Affect: Mood normal.        Behavior:  Behavior normal.       Results for orders placed or performed in visit on 08/10/18  Vitamin D, 25-hydroxy  Result Value Ref Range   VITD 65.24 30.00 - 100.00 ng/mL  Vitamin B12  Result Value Ref Range   Vitamin B-12 335 211 - 911 pg/mL  Comprehensive metabolic panel  Result Value Ref Range   Sodium 142 135 - 145 mEq/L   Potassium  5.0 3.5 - 5.1 mEq/L   Chloride 100 96 - 112 mEq/L   CO2 34 (H) 19 - 32 mEq/L   Glucose, Bld 167 (H) 70 - 99 mg/dL   BUN 49 (H) 6 - 23 mg/dL   Creatinine, Ser 1.81 (H) 0.40 - 1.50 mg/dL   Total Bilirubin 1.0 0.2 - 1.2 mg/dL   Alkaline Phosphatase 87 39 - 117 U/L   AST 11 0 - 37 U/L   ALT 13 0 - 53 U/L   Total Protein 6.3 6.0 - 8.3 g/dL   Albumin 4.1 3.5 - 5.2 g/dL   Calcium 9.8 8.4 - 10.5 mg/dL   GFR 37.18 (L) >60.00 mL/min  CBC with Differential  Result Value Ref Range   WBC 7.6 4.0 - 10.5 K/uL   RBC 4.60 4.22 - 5.81 Mil/uL   Hemoglobin 13.4 13.0 - 17.0 g/dL   HCT 40.6 39.0 - 52.0 %   MCV 88.3 78.0 - 100.0 fl   MCHC 33.1 30.0 - 36.0 g/dL   RDW 15.6 (H) 11.5 - 15.5 %   Platelets 184.0 150.0 - 400.0 K/uL   Neutrophils Relative % 69.9 43.0 - 77.0 %   Lymphocytes Relative 19.1 12.0 - 46.0 %   Monocytes Relative 6.6 3.0 - 12.0 %   Eosinophils Relative 3.7 0.0 - 5.0 %   Basophils Relative 0.7 0.0 - 3.0 %   Neutro Abs 5.3 1.4 - 7.7 K/uL   Lymphs Abs 1.5 0.7 - 4.0 K/uL   Monocytes Absolute 0.5 0.1 - 1.0 K/uL   Eosinophils Absolute 0.3 0.0 - 0.7 K/uL   Basophils Absolute 0.1 0.0 - 0.1 K/uL  Brain natriuretic peptide  Result Value Ref Range   Pro B Natriuretic peptide (BNP) 70.0 0.0 - 100.0 pg/mL   Assessment & Plan:   Problem List Items Addressed This Visit    Vitamin D deficiency    Levels much better since starting vit D weekly supplement - continue.       Vitamin B12 deficiency    B12 shot today.       Uncontrolled type 2 diabetes mellitus with peripheral neuropathy ([place])    Appreciate endo care. Was unable to afford highly  concentrated insulin.      Pre-op evaluation - Primary    States diagnosed with large 3.3cm L kidney stone that will need to be removed. Upcoming percutaneous nephrolithotomy 10/2018 (Dahlstedt), states needs medical clearance. Will check CXR. Recent labs reassuring except for worsened renal insufficiency. I have not yet received medical clearance. Recommend rpt renal panel closer to surgery date.  RCRI = 1, BNP 70.       Relevant Orders   DG Chest 2 View   Pernicious anemia    b12 shot today - continue monthly shots.       Other fatigue    Improved with better hydration status.       Kidney stone    States diagnosed with large 3.3cm L kidney stone that will need to be removed. Upcoming planned percutaneous nephrolithotomy 10/2018 (Dahlstedt).       Constipation    Reviewed daily bowel regimen - needs this while he's on regular narcotic. Continue senna-docusate, add miralax PRN. He also has been using mag citrate with benefit.      CKD stage 3 due to type 2 diabetes mellitus ([place])    Acute worsening anticipate related to dehydration, this could have been contributing to fatigue earlier this week. Improving with better hydration status. Advised cut lasix  in half for 2 days then return to regular 80mg  daily dose.       Chronic insomnia    Nortriptyline overly sedating - actually feels sleeping better off meds - will stop nortriptyline.           Meds ordered this encounter  Medications  . cyanocobalamin ((VITAMIN B-12)) injection 1,000 mcg   Orders Placed This Encounter  Procedures  . DG Chest 2 View    Standing Status:   Future    Number of Occurrences:   1    Standing Expiration Date:   10/13/2019    Order Specific Question:   Reason for Exam (SYMPTOM  OR DIAGNOSIS REQUIRED)    Answer:   preop eval    Order Specific Question:   Preferred imaging location?    Answer:   [facility]    Order Specific Question:   Radiology Contrast Protocol - do NOT remove file  path    Answer:   [email or web address]    Follow up plan: Return in about 4 months (around 12/13/2018) for follow up visit.  Ria Bush, MD

## 2018-08-13 ENCOUNTER — Other Ambulatory Visit: Payer: Self-pay | Admitting: Family Medicine

## 2018-08-13 NOTE — Telephone Encounter (Signed)
Pt has a follow-up appointment on 09/30/2018 with Dr. Saunders Revel and clearance will be addressed at that time. Will fwd to requesting surgeon's office to make them aware.

## 2018-08-13 NOTE — Telephone Encounter (Signed)
Appointment has been rescheduled with Gollan on 9/6

## 2018-08-13 NOTE — Telephone Encounter (Signed)
THis is a patient of Dr Rockey Situ. Not sure why he is scheduled to see Dr End. Please call patient and reschedule with Dr Rockey Situ or an APP. Thanks!

## 2018-09-03 ENCOUNTER — Other Ambulatory Visit: Payer: Self-pay | Admitting: Endocrinology

## 2018-09-04 ENCOUNTER — Other Ambulatory Visit: Payer: Self-pay

## 2018-09-04 DIAGNOSIS — E119 Type 2 diabetes mellitus without complications: Secondary | ICD-10-CM

## 2018-09-04 MED ORDER — GE100 BLOOD GLUCOSE TEST VI STRP: 1.0000 | ORAL_STRIP | Freq: Four times a day (QID) | 1 refills | Status: DC

## 2018-09-07 DIAGNOSIS — M109 Gout, unspecified: Secondary | ICD-10-CM | POA: Diagnosis not present

## 2018-09-07 DIAGNOSIS — N183 Chronic kidney disease, stage 3 (moderate): Secondary | ICD-10-CM | POA: Diagnosis not present

## 2018-09-07 DIAGNOSIS — Z9989 Dependence on other enabling machines and devices: Secondary | ICD-10-CM | POA: Diagnosis not present

## 2018-09-07 DIAGNOSIS — N179 Acute kidney failure, unspecified: Secondary | ICD-10-CM | POA: Diagnosis not present

## 2018-09-07 DIAGNOSIS — N2581 Secondary hyperparathyroidism of renal origin: Secondary | ICD-10-CM | POA: Diagnosis not present

## 2018-09-07 DIAGNOSIS — D631 Anemia in chronic kidney disease: Secondary | ICD-10-CM | POA: Diagnosis not present

## 2018-09-07 DIAGNOSIS — I129 Hypertensive chronic kidney disease with stage 1 through stage 4 chronic kidney disease, or unspecified chronic kidney disease: Secondary | ICD-10-CM | POA: Diagnosis not present

## 2018-09-07 DIAGNOSIS — Z6841 Body Mass Index (BMI) 40.0 and over, adult: Secondary | ICD-10-CM | POA: Diagnosis not present

## 2018-09-07 DIAGNOSIS — D351 Benign neoplasm of parathyroid gland: Secondary | ICD-10-CM | POA: Diagnosis not present

## 2018-09-07 DIAGNOSIS — E1122 Type 2 diabetes mellitus with diabetic chronic kidney disease: Secondary | ICD-10-CM | POA: Diagnosis not present

## 2018-09-07 DIAGNOSIS — G4733 Obstructive sleep apnea (adult) (pediatric): Secondary | ICD-10-CM | POA: Diagnosis not present

## 2018-09-08 ENCOUNTER — Other Ambulatory Visit: Payer: Self-pay | Admitting: Family Medicine

## 2018-09-08 NOTE — Telephone Encounter (Signed)
Electronic refill request gabapentin Last refill 12/19/17 #180/3 Last office visit 08/12/18

## 2018-09-10 ENCOUNTER — Other Ambulatory Visit: Payer: Self-pay | Admitting: Family Medicine

## 2018-09-10 NOTE — Telephone Encounter (Signed)
Name of Medication: Alprazolam Name of Pharmacy: [place] or Written Date and Quantity: 08/04/18, #30 Last Office Visit and Type: 08/12/18, pre-op exam Next Office Visit and Type: 12/18/18, 4 mo f/u Last Controlled Substance Agreement Date: none Last UDS: none

## 2018-09-11 NOTE — Telephone Encounter (Signed)
Eprescribed.

## 2018-09-15 ENCOUNTER — Ambulatory Visit: Payer: Medicare Other

## 2018-09-16 ENCOUNTER — Ambulatory Visit (INDEPENDENT_AMBULATORY_CARE_PROVIDER_SITE_OTHER): Payer: Medicare Other

## 2018-09-16 DIAGNOSIS — E538 Deficiency of other specified B group vitamins: Secondary | ICD-10-CM

## 2018-09-16 DIAGNOSIS — D2272 Melanocytic nevi of left lower limb, including hip: Secondary | ICD-10-CM | POA: Diagnosis not present

## 2018-09-16 DIAGNOSIS — D225 Melanocytic nevi of trunk: Secondary | ICD-10-CM | POA: Diagnosis not present

## 2018-09-16 DIAGNOSIS — L821 Other seborrheic keratosis: Secondary | ICD-10-CM | POA: Diagnosis not present

## 2018-09-16 DIAGNOSIS — D2262 Melanocytic nevi of left upper limb, including shoulder: Secondary | ICD-10-CM | POA: Diagnosis not present

## 2018-09-16 DIAGNOSIS — L570 Actinic keratosis: Secondary | ICD-10-CM | POA: Diagnosis not present

## 2018-09-16 DIAGNOSIS — Z872 Personal history of diseases of the skin and subcutaneous tissue: Secondary | ICD-10-CM | POA: Diagnosis not present

## 2018-09-16 DIAGNOSIS — Z85828 Personal history of other malignant neoplasm of skin: Secondary | ICD-10-CM | POA: Diagnosis not present

## 2018-09-16 DIAGNOSIS — Z08 Encounter for follow-up examination after completed treatment for malignant neoplasm: Secondary | ICD-10-CM | POA: Diagnosis not present

## 2018-09-16 MED ORDER — CYANOCOBALAMIN 1000 MCG/ML IJ SOLN
1000.0000 ug | Freq: Once | INTRAMUSCULAR | Status: AC
  Administered 2018-09-16: 09:00:00 1000 ug via INTRAMUSCULAR

## 2018-09-16 NOTE — Progress Notes (Signed)
Per orders of Dr. Gutierrez, injection of vit B12 given by Kiylah Loyer. Patient tolerated injection well.  

## 2018-09-21 NOTE — Progress Notes (Signed)
Office Visit    Patient Name: Donald [name] Date of Encounter: 09/23/2018  Primary Care Provider:  Ria Bush, MD Primary Cardiologist:  Donald Rogue, MD  Chief Complaint    73 year old male with history of HFpEF (EF 60%, 2012), coronary artery disease (cath 2012 with no critical high grade obstructive dz, elevated LVEDP), poorly controlled and insulin dependent diabetes, hyperlipidemia, PAD, 2009 DVT and extensive bilateral pulmonary embolism, CKD 3, OSA on CPAP, asthmatic bronchitis, morbid obesity, gout, GERD, anemia, history of hematuria, s/p parathyroidectomy (right sided, 2018), hypercalcemia, chronic back pain s/p surgeries, and who presents today for pre-op evaluation prior to scheduled 10/19/2018 percutaneous nephrolithotomy due to renal stone.   Past Medical History    Past Medical History:  Diagnosis Date  . ANEMIA-IRON DEFICIENCY 07/26/2006  . Anxiety   . ASTHMA 07/26/2006  . Asthma   . Back pain, chronic   . Bell's palsy   . Chronic kidney disease    stage 3 renal failure  . COLONIC POLYPS, HX OF 07/26/2006  . DEPRESSION 03/14/2009  . DIABETES MELLITUS, TYPE II 07/26/2006  . [place] DISEASE, LUMBAR 10/05/2007  . DVT 12/03/2007  . DYSLIPIDEMIA 04/13/2009  . Edema    ankle  . GERD 07/26/2006  . Gout   . Heart murmur   . History of kidney stones   . HYPERTENSION 07/26/2006  . INSOMNIA 08/21/2007  . Neuropathy   . OBSTRUCTIVE SLEEP APNEA 12/03/2007   Use C-PAP  . PERIPHERAL NEUROPATHY 07/26/2006  . Pernicious anemia 11/20/2006  . PULMONARY EMBOLISM 10/05/2007  . Renal insufficiency   . Shortness of breath dyspnea    Past Surgical History:  Procedure Laterality Date  . APPENDECTOMY  1968  . [place], 06/14/2008   Dr. Trenton [name] at [facility] (06/10)  . CARDIAC CATHETERIZATION    . CARPAL TUNNEL RELEASE Right 10/2017  . CATARACT EXTRACTION W/PHACO Left 05/30/2014   Procedure: CATARACT EXTRACTION PHACO AND INTRAOCULAR LENS PLACEMENT (IOC);   Surgeon: Donald Cotta, MD;  Location: [facility] ORS;  Service: Ophthalmology;  Laterality: Left;  [name] 01:20 AP% 23.7 CDE 31.86  . COLONOSCOPY  10/2002   HP, SSA, TA, rpt 3 yrs ([place])  . COLONOSCOPY  01/2005   diverticulosis rpt 5 yrs ([place])   . LITHOTRIPSY     X 2  . PARATHYROIDECTOMY Right 04/18/2016   PARATHYROIDECTOMY for cyst Donald Manner, MD)  . SHOULDER ARTHROSCOPY W/ ROTATOR CUFF REPAIR Right   . TONSILLECTOMY      Allergies  Allergies  Allergen Reactions  . Codeine Other (See Comments)    REACTION: chest pain  . Pioglitazone Other (See Comments)    REACTION to Actos: swelling in ankles    History of Present Illness    73 year old male with complex PMH as above including nonobstructive CAD, poorly controlled and insulin dependent DM2, HTN, OSA on CPAP, history of DVT/bilateral PE, and HFpEF.  In September 2009, he had a left sided DVT and extensive bilateral PE that was felt to be idipathic. He remained on coumadin until October 2010 with follow-up CTA negative for PE but that showed coronary artery calcification and cardiomegaly. He had a subsequent 2012 echocardiogram that showed EF 60-70%, mild LVH, mild LAE/RAE, and was without wall motion abnormalities. After an abnormal EKG in 2018, he had a stress test that was without significant ischemia.    [name] is seen today for pre-procedure evaluation as above. Due to ongoing back pain (s/p 2 back surgeries), he reports  that he continues to be very sedentary at baseline. He reportedly is on medical pain management for ongoing back pain, given that neurosurgery does not recommend any further procedures. He denies any s/sx of acute bleeding such as melena or hemoptysis with pain management medications. He stated he is able to ambulate short distances with a cane. He frequently has to rest due to back pain and SOB but denies any CP, palpitations, presyncope, or syncope with exertion. If asked to walk 1 block, he feels he  would need to rest frequently 2/2 pain and shortness of breath, which improves with several minutes of rest. He would also be able to climb 1 flight of stairs if able to climb them one stair at a time and frequently rest due to pain and SOB. He reported that he is able to vacuum, fold laundry, do dishes, dust, and help his wife make the bed without difficulty, however. He stated that he feels his SOB with walking and stairs is thus due to his back pain. He did admit to some deconditioning, as he does not have a regular exercise routine. He also frequently eats out at restaurants and does not control his salt intake. He denied any abdominal distention but reported that he usually has chronic and baseline 1+ bilateral LEE. He does not use compression stockings but sometimes remembers to elevate his legs with subsequent LEE improvement. He chronically uses two pillows at night, reportedly done for comfort and as he has "flat pillows." He also has OSA andreported compliance with CPAP. He does not drink alcohol or use tobacco.  Of note, during today's appointment, he reported recent increase of weight and a feeling of "holding on to fluid," particularly in his bilateral lower extremities. He stated that he had recently been traveling in the car and thus skipped his Lasix yesterday 9/8 with worsening LEE that day and only slight improvement with this morning's lasix dose. BP at time of today's visit was elevated with SBP into the 160 and repeat SBP 150s.   Home Medications    Prior to Admission medications   Medication Sig Start Date End Date Taking? Authorizing Provider  allopurinol (ZYLOPRIM) 300 MG tablet TAKE 1 TABLET BY MOUTH DAILY 04/14/18  Yes Donald Bush, MD  ALPRAZolam Donald [name]) 1 MG tablet TAKE ONE TABLET BY MOUTH AT BEDTIME AS NEEDED FOR SLEEP 09/11/18  Yes Donald Bush, MD  amLODipine (NORVASC) 2.5 MG tablet Take 2.5 mg by mouth daily.   Yes [provider]  aspirin 81 MG chewable  tablet Chew 81 mg by mouth daily.   Yes [provider]  Cholecalciferol (VITAMIN D3) 1.25 MG (50000 UT) TABS Take 1 tablet by mouth once a week. 03/20/18  Yes Donald Bush, MD  colchicine (COLCRYS) 0.6 MG tablet TAKE 1 TABLET BY MOUTH EVERY HOUR UNTIL SYMPTOMS ARE BETTER, MAX OF 6TABS PER DAY. STOP IF DIARRHEA Patient taking differently: TAKE 1 TABLET BY MOUTH EVERY HOUR UNTIL SYMPTOMS ARE BETTER AS NEEDED, MAX OF 6TABS PER DAY. STOP IF DIARRHEA 01/13/17  Yes Renato Shin, MD  fluticasone [facility]) 50 MCG/ACT nasal spray Place 2 sprays into both nostrils daily. 04/28/18  Yes Donald Bush, MD  furosemide (LASIX) 80 MG tablet Take 1 tablet (80 mg total) by mouth daily. 12/19/17  Yes Donald Bush, MD  gabapentin (NEURONTIN) 600 MG tablet TAKE TWO TABLETS (1200MG ) BY MOUTH AT BEDTIME 09/11/18  Yes Donald Bush, MD  glucose blood (GE100 BLOOD GLUCOSE TEST) test strip 1 each by Other route  4 (four) times daily. E11.21 09/04/18  Yes Renato Shin, MD  HYDROcodone-acetaminophen [facility] 10-325 MG tablet Take 1 tablet by mouth every 6 (six) hours as needed for moderate pain. 12/19/17  Yes Donald Bush, MD  insulin NPH Human (HUMULIN N) 100 UNIT/ML injection Inject 2.9 mLs (290 Units total) into the skin at bedtime. 06/04/18  Yes Renato Shin, MD  insulin regular (NOVOLIN R RELION) 100 units/mL injection INJECT 210 UNITS INTO THE SKIN BEFORE BREAKFAST, INJECT 170 UNITS BEFORE LUNCH, AND INJECT 270 UNITS BEFORE DINNER. ([place]) 08/04/18  Yes Renato Shin, MD  losartan (COZAAR) 100 MG tablet TAKE ONE TABLET BY MOUTH AT BEDTIME 06/18/18  Yes Donald Bush, MD  lovastatin (MEVACOR) 40 MG tablet TAKE 2 TABLETS BY MOUTH (80MG  TOTAL) AT BEDTIME 08/14/18  Yes Tonia Ghent, MD  Naphazoline-Pheniramine (OPCON-A OP) Place 1 drop into both eyes 2 (two) times daily as needed.   Yes [provider]  ondansetron (ZOFRAN) 4 MG tablet Take 1 tablet (4 mg total) by mouth  every 4 (four) hours as needed for nausea. 08/10/18  Yes Lesleigh Noe, MD  Sennosides-Docusate Sodium (STOOL SOFTENER & LAXATIVE PO) Take 2 tablets by mouth at bedtime.   Yes [provider]  vitamin C (ASCORBIC ACID) 500 MG tablet Take 1 tablet (500 mg total) by mouth daily. 12/19/17  Yes Donald Bush, MD    Review of Systems    He denies chest pain, palpitations, pnd, orthopnea (2 pillows reportedly for comfort), n, v, dizziness, syncope, or early satiety. He reported weight gain, progressive LEE, SOB/DOE, and back pain. All other systems reviewed and are otherwise negative except as noted above.  Physical Exam    VS:  BP (!) 164/60 (BP Location: Left Arm, Patient Position: Sitting, Cuff Size: Large)   Pulse 71   Ht 5' 5.5" (1.664 m)   Wt 284 lb 8 oz (129 kg)   BMI 46.62 kg/m  , BMI Body mass index is 46.62 kg/m. GEN: Obese male, in no acute distress. HEENT: normal. Neck: Supple, no carotid bruits, or masses. JVD difficult to assess d/t body habitus.  Cardiac: RRR, no murmurs, rubs, or gallops. No clubbing, cyanosis. 1+ bilateral LEE. Radials/DP/PT 1+ and equal bilaterally.  Respiratory:  Respirations regular and unlabored, trace bibasilar crackles. GI: Soft, nontender, nondistended, BS + x 4. MS: no deformity or atrophy. Skin: warm and dry, no rash. Neuro:  Strength and sensation are intact. Psych: Normal affect.  Accessory Clinical Findings    ECG personally reviewed by me today - SR, 1st degree AVB, LAD - no acute changes.  Assessment & Plan    Preoperative Clearance, 10/2018 percutaneous nephrolithotomy 2/2 renal stones --Obtain echo, given last echo in 2012. Last stress test in 2018 low risk.  --Reported SOB/DOE and will update echo. --Continue ASA in preparation for surgery unless urology feels bleeding risk associated with procedure outweighs benefit of ASA. If held, restart of ASA per urology.  --Functional capacity low-moderate at approximately 4 METs  as can complete ADLs, walk indoors with a cane /rest, walk 1-2 level blocks with cane /rest, complete housework, and climb stairs with cane / rest. Revised cardiac index (RCRI) score 6.6%= Clinical risk factors include HFpEF, DM2 on insulin. Of note, borderline renal insufficiency with Cr almost 2.0 (07/2018 Cr 1.81). MACE with 2RF  6.6%. Procedure associated risk low <1%. Patient is moderate but acceptable risk for perioperative complications once obtain echo.    HFpEF --2012 echo as above with  EF normal. Volume overloaded on exam today with b/l pitting LEE, trace bibasilar crackles, and elevated BP. Patient reportedly did not take his Lasix yesterday. --Increase Lasix to 80mg  twice daily for three days then decrease back down to usual Lasix 80mg  daily dose. Follow-up BMET in 1-2 weeks and at time of follow-up to check renal function. No plan for potassium supplementation with increased diuretic dose given recent labs show hyperkalemia. Continue ARB, CCB. --Instructed patient to weigh himself daily and check BP twice daily (AM after meds and PM). Record this data and bring it in to follow-up. Will update echo prior to procedure.   HTN --Poorly controlled today and in the setting of volume overload. SBP 160s today with recheck 150s. Patient reports lowest SBP as 130s and often notices home SBP 160s. If SBP still elevated after increased diuresis  for 3 days and once euvolemic on exam, consider increase in amlodipine for more optimal BP control. Continue current dose CCB for now with ARB.  CAD --2012 cath without critical obstructive dz. 2012 echo as above with normal EF. 2018 stress test without signs of ischemia. No recent CP but does not SOB/DOE with exertion, which he attributes to back pain. Update echo as above. Continue ASA, ARB, CCB, and statin. Aggressive risk factor modification recommended.  Morbid obesity --Weight loss advised. Dietary and lifestyle changes recommended.  Insulin dependent  DM2 with CKD3 --Poorly controlled and insulin dependent DM2.  --07/2018 A1C 7.7.   --Renal function likely worsened by poorly controlled DM2. Most recent Cr 1.81 as above.  --Follow-up BMET as above to monitor renal function s/p increase in diuretic as above for 3 days.   HLD --LDL  Controlled with 03/2018 LDL 37. Liver function stable. Continue statin.  Disposition: Increase Lasix to 80mg  BID for three days then drop back down to Lasix 80mg  once daily. Follow-up in the office in 1-2 weeks to reassess volume, BP, and renal function (BMET) at that time. Obtain echo for preop testing. Pending echo, RCRI and risk scoring as above.  Arvil Chaco, PA-C 09/23/2018, 5:27 PM

## 2018-09-23 ENCOUNTER — Ambulatory Visit (INDEPENDENT_AMBULATORY_CARE_PROVIDER_SITE_OTHER): Payer: Medicare Other | Admitting: Physician Assistant

## 2018-09-23 ENCOUNTER — Other Ambulatory Visit: Payer: Self-pay

## 2018-09-23 ENCOUNTER — Encounter: Payer: Self-pay | Admitting: Cardiovascular Disease

## 2018-09-23 VITALS — BP 164/60 | HR 71 | Ht 65.5 in | Wt 284.5 lb

## 2018-09-23 DIAGNOSIS — N183 Chronic kidney disease, stage 3 unspecified: Secondary | ICD-10-CM

## 2018-09-23 DIAGNOSIS — E119 Type 2 diabetes mellitus without complications: Secondary | ICD-10-CM

## 2018-09-23 DIAGNOSIS — Z794 Long term (current) use of insulin: Secondary | ICD-10-CM

## 2018-09-23 DIAGNOSIS — I10 Essential (primary) hypertension: Secondary | ICD-10-CM | POA: Diagnosis not present

## 2018-09-23 DIAGNOSIS — E1122 Type 2 diabetes mellitus with diabetic chronic kidney disease: Secondary | ICD-10-CM | POA: Diagnosis not present

## 2018-09-23 DIAGNOSIS — E785 Hyperlipidemia, unspecified: Secondary | ICD-10-CM

## 2018-09-23 DIAGNOSIS — I2510 Atherosclerotic heart disease of native coronary artery without angina pectoris: Secondary | ICD-10-CM | POA: Diagnosis not present

## 2018-09-23 DIAGNOSIS — Z01810 Encounter for preprocedural cardiovascular examination: Secondary | ICD-10-CM

## 2018-09-23 DIAGNOSIS — I5032 Chronic diastolic (congestive) heart failure: Secondary | ICD-10-CM

## 2018-09-23 DIAGNOSIS — IMO0001 Reserved for inherently not codable concepts without codable children: Secondary | ICD-10-CM

## 2018-09-23 NOTE — Patient Instructions (Addendum)
Medication Instructions:  Your physician has recommended you make the following change in your medication:  INCREASE Lasix to 80mg  twice daily for 3 days then resume 80mg  daily.  If you need a refill on your cardiac medications before your next appointment, please call your pharmacy.   Lab work: Your physician recommends that you return for lab work in: 1-2 weeks  If you have labs (blood work) drawn today and your tests are completely normal, you will receive your results only by: Marland Kitchen MyChart Message (if you have MyChart) OR . A paper copy in the mail If you have any lab test that is abnormal or we need to change your treatment, we will call you to review the results.  Testing/Procedures: Your physician has requested that you have an echocardiogram. Echocardiography is a painless test that uses sound waves to create images of your heart. It provides your doctor with information about the size and shape of your heart and how well your heart's chambers and valves are working. This procedure takes approximately one hour. There are no restrictions for this procedure.    Follow-Up: At [facility], you and your health needs are our priority.  As part of our continuing mission to provide you with exceptional heart care, we have created designated Provider Care Teams.  These Care Teams include your primary Cardiologist (physician) and Advanced Practice Providers (APPs -  Physician Assistants and Nurse Practitioners) who all work together to provide you with the care you need, when you need it. You will need a follow up appointment in 1-2 weeks.    You may see Ida Rogue, MD or one of the following Advanced Practice Providers on your designated Care Team:   Murray Hodgkins, NP Christell Faith, PA-C . Marrianne Mood, PA-C  Any Other Special Instructions Will Be Listed Below (If Applicable). N/A

## 2018-09-24 ENCOUNTER — Other Ambulatory Visit: Payer: Self-pay | Admitting: Physician Assistant

## 2018-09-24 DIAGNOSIS — R0600 Dyspnea, unspecified: Secondary | ICD-10-CM

## 2018-09-30 ENCOUNTER — Ambulatory Visit: Payer: Medicare Other | Admitting: Internal Medicine

## 2018-10-01 ENCOUNTER — Other Ambulatory Visit: Payer: Self-pay

## 2018-10-01 ENCOUNTER — Ambulatory Visit (INDEPENDENT_AMBULATORY_CARE_PROVIDER_SITE_OTHER): Payer: Medicare Other

## 2018-10-01 ENCOUNTER — Other Ambulatory Visit (INDEPENDENT_AMBULATORY_CARE_PROVIDER_SITE_OTHER): Payer: Medicare Other

## 2018-10-01 ENCOUNTER — Ambulatory Visit: Admit: 2018-10-01 | Payer: Medicare Other | Admitting: Urology

## 2018-10-01 DIAGNOSIS — I10 Essential (primary) hypertension: Secondary | ICD-10-CM | POA: Diagnosis not present

## 2018-10-01 DIAGNOSIS — R0600 Dyspnea, unspecified: Secondary | ICD-10-CM | POA: Diagnosis not present

## 2018-10-01 SURGERY — NEPHROLITHOTOMY PERCUTANEOUS
Anesthesia: General | Laterality: Right

## 2018-10-01 MED ORDER — PERFLUTREN LIPID MICROSPHERE
1.0000 mL | INTRAVENOUS | Status: AC | PRN
  Administered 2018-10-01: 2 mL via INTRAVENOUS

## 2018-10-02 ENCOUNTER — Telehealth: Payer: Self-pay | Admitting: Cardiovascular Disease

## 2018-10-02 LAB — BASIC METABOLIC PANEL
BUN/Creatinine Ratio: 24 (ref 10–24)
BUN: 37 mg/dL — ABNORMAL HIGH (ref 8–27)
CO2: 26 mmol/L (ref 20–29)
Calcium: 9.6 mg/dL (ref 8.6–10.2)
Chloride: 103 mmol/L (ref 96–106)
Creatinine, Ser: 1.57 mg/dL — ABNORMAL HIGH (ref 0.76–1.27)
GFR calc Af Amer: 51 mL/min/{1.73_m2} — ABNORMAL LOW (ref >59)
GFR calc non Af Amer: 44 mL/min/{1.73_m2} — ABNORMAL LOW (ref >59)
Glucose: 133 mg/dL — ABNORMAL HIGH (ref 65–99)
Potassium: 4.7 mmol/L (ref 3.5–5.2)
Sodium: 145 mmol/L — ABNORMAL HIGH (ref 134–144)

## 2018-10-02 NOTE — Telephone Encounter (Signed)
   [facility] Pre-operative Risk Assessment    Request for surgical clearance:  What type of surgery is being performed? LEFT PERCUTANEOUS NEPHROLITHOTOMY WITH LEFT NEPHASTOMY TUBE PLACE PRIOR TO PROCEDURE When is this surgery scheduled?  10/19/18 1. What type of clearance is required (medical clearance vs. Pharmacy clearance to hold med vs. Both)? MEDICAL  2. Are there any medications that need to be held prior to surgery and how long?* NOT LISTED 3. Practice name and name of physician performing surgery? [facility]  4. What is your office phone number [phone]   7.   What is your office fax number [phone]  8.   Anesthesia type (None, local, MAC, general) ? NOT LISTED   Lucienne Minks 10/02/2018, 10:53 AM  _________________________________________________________________   (provider comments below)

## 2018-10-02 NOTE — Telephone Encounter (Signed)
   Primary Cardiologist: Ida Rogue, MD  Chart reviewed as part of pre-operative protocol coverage. Patient was contacted 10/02/2018 in reference to pre-operative risk assessment for pending surgery as outlined below.  Donald [name] was last seen on 09/23/2018  by Marrianne Mood, PA-C. His functional capacity was noted to be low-moderate at approximately 4 METs as can complete ADLs, walk indoors with a cane /rest, walk 1-2 level blocks with cane /rest, complete housework, and climb stairs with cane / rest. Revised cardiac index (RCRI) score 6.6%= Clinical risk factors include HFpEF, DM2 on insulin. Of note, borderline renal insufficiency with Cr almost 2.0 (07/2018 Cr 1.81). MACE with 2RF  6.6%. Procedure associated risk low <1%. Patient is moderate but acceptable risk for perioperative complications once obtain echo which was performed on 10/01/2018 that was overall normal.   Therefore, based on ACC/AHA guidelines, the patient would be at acceptable risk for the planned procedure without further cardiovascular testing.   I will route this recommendation to the requesting party via Epic fax function and remove from pre-op pool.  Please call with questions.  Kathyrn Drown, NP 10/02/2018, 12:29 PM

## 2018-10-05 ENCOUNTER — Telehealth: Payer: Self-pay

## 2018-10-05 NOTE — Telephone Encounter (Signed)
Called to give the patient echo and lab results. Lmtcb.    Marrianne Mood D, PA-C          Please let Mr. Lieurance know that his renal function is stable and actually improved from that of his labs 1 month ago. These are reassuring results!

## 2018-10-05 NOTE — Telephone Encounter (Signed)
-----   Message from Arvil Chaco, PA-C sent at 10/03/2018 12:28 AM EDT ----- Please let Mr. Ratledge know that his renal function is stable and actually improved from that of his labs 1 month ago. These are reassuring results!

## 2018-10-05 NOTE — Telephone Encounter (Signed)
Patient made aware of lab and echo results with verbal understanding.

## 2018-10-08 ENCOUNTER — Ambulatory Visit (INDEPENDENT_AMBULATORY_CARE_PROVIDER_SITE_OTHER): Payer: Medicare Other | Admitting: Nurse Practitioner

## 2018-10-08 ENCOUNTER — Other Ambulatory Visit: Payer: Self-pay

## 2018-10-08 ENCOUNTER — Encounter: Payer: Self-pay | Admitting: Nurse Practitioner

## 2018-10-08 VITALS — BP 148/62 | HR 55 | Temp 96.7°F | Ht 65.5 in | Wt 280.0 lb

## 2018-10-08 DIAGNOSIS — N183 Chronic kidney disease, stage 3 unspecified: Secondary | ICD-10-CM

## 2018-10-08 DIAGNOSIS — I5032 Chronic diastolic (congestive) heart failure: Secondary | ICD-10-CM

## 2018-10-08 DIAGNOSIS — I10 Essential (primary) hypertension: Secondary | ICD-10-CM

## 2018-10-08 DIAGNOSIS — E782 Mixed hyperlipidemia: Secondary | ICD-10-CM | POA: Diagnosis not present

## 2018-10-08 DIAGNOSIS — I2510 Atherosclerotic heart disease of native coronary artery without angina pectoris: Secondary | ICD-10-CM | POA: Diagnosis not present

## 2018-10-08 DIAGNOSIS — Z23 Encounter for immunization: Secondary | ICD-10-CM | POA: Diagnosis not present

## 2018-10-08 MED ORDER — AMLODIPINE BESYLATE 5 MG PO TABS: 5.0000 mg | ORAL_TABLET | Freq: Every day | ORAL | 3 refills | Status: DC

## 2018-10-08 NOTE — Patient Instructions (Signed)
Medication Instructions:  1- INCREASE Amlodipine Take 1 tablet (5 mg total) by mouth daily. If you need a refill on your cardiac medications before your next appointment, please call your pharmacy.   Lab work: None ordered  If you have labs (blood work) drawn today and your tests are completely normal, you will receive your results only by: Marland Kitchen MyChart Message (if you have MyChart) OR . A paper copy in the mail If you have any lab test that is abnormal or we need to change your treatment, we will call you to review the results.  Testing/Procedures: None ordered   Follow-Up: At [facility], you and your health needs are our priority.  As part of our continuing mission to provide you with exceptional heart care, we have created designated Provider Care Teams.  These Care Teams include your primary Cardiologist (physician) and Advanced Practice Providers (APPs -  Physician Assistants and Nurse Practitioners) who all work together to provide you with the care you need, when you need it. You will need a follow up appointment in 3-4 months.  You may see Ida Rogue, MD or Murray Hodgkins, NP.

## 2018-10-08 NOTE — Progress Notes (Signed)
Office Visit    Patient Name: Donald [name] Date of Encounter: 10/08/2018  Primary Care Provider:  Ria Bush, MD Primary Cardiologist:  Ida Rogue, MD  Chief Complaint    73 year old white male with a history HFpEF (60%-65%, 2020), nonobstructive CAD, insulin dependent diabetes (A1C 7.7, 07/2018), hyperlipidemia, PAD, DVT and bilateral pulmonary embolism, CKD 3, OSA on CPAP, asthmatic bronchitis, morbid obesity, gout, GERD, anemia, and chronic back pain, who presents for follow-up of lower extremity swelling.  Past Medical History    Past Medical History:  Diagnosis Date   (HFpEF) heart failure with preserved ejection fraction ([place])    a. 09/2018 Echo: EF 60-65%. PASP 59mmHg. Mild-mod LAE. Mild MR/TR.   ANEMIA-IRON DEFICIENCY 07/26/2006   Anxiety    ASTHMA 07/26/2006   Asthma    Back pain, chronic    Bell's palsy    CKD (chronic kidney disease), stage III ([place])    COLONIC POLYPS, HX OF 07/26/2006   DEPRESSION 03/14/2009   DIABETES MELLITUS, TYPE II 07/26/2006   [place] DISEASE, LUMBAR 10/05/2007   DVT 12/03/2007   DYSLIPIDEMIA 04/13/2009   GERD 07/26/2006   Gout    Heart murmur    History of kidney stones    HYPERTENSION 07/26/2006   INSOMNIA 08/21/2007   Neuropathy    Nonobstructive CAD (coronary artery disease)    a. 2012 Cath: no high grade stenosis; b. 2018 MV: No ischemia. Attenuation artifact.    OBSTRUCTIVE SLEEP APNEA 12/03/2007   Use C-PAP   PERIPHERAL NEUROPATHY 07/26/2006   Pernicious anemia 11/20/2006   PULMONARY EMBOLISM 10/05/2007   Past Surgical History:  Procedure Laterality Date   [place], 06/14/2008   Dr. Trenton Gammon at [facility] [phone])   [place] Right 10/2017   CATARACT EXTRACTION W/PHACO Left 05/30/2014   Procedure: CATARACT EXTRACTION PHACO AND INTRAOCULAR LENS PLACEMENT ([place]);  Surgeon: Estill Cotta, MD;  Location: [facility] ORS;   Service: Ophthalmology;  Laterality: Left;  [name] 01:20 AP% 23.7 CDE 31.86   COLONOSCOPY  10/2002   HP, SSA, TA, rpt 3 yrs ([place])   COLONOSCOPY  01/2005   diverticulosis rpt 5 yrs ([place])    LITHOTRIPSY     X 2   PARATHYROIDECTOMY Right 04/18/2016   PARATHYROIDECTOMY for cyst [name], MD)   SHOULDER ARTHROSCOPY W/ ROTATOR CUFF REPAIR Right    TONSILLECTOMY      Allergies  Allergies  Allergen Reactions   Codeine Other (See Comments)    REACTION: chest pain   Pioglitazone Other (See Comments)    REACTION to Actos: swelling in ankles    History of Present Illness    73 year old white male with the above past medical history including HFpEF (60%-65%, 2020), nonobstructive CAD,  insulin dependent diabetes (A1C 7.7, 07/2018), hyperlipidemia, PAD, DVT and bilateral pulmonary embolism, CKD 3, OSA on CPAP, asthmatic bronchitis, morbid obesity, gout, GERD, anemia, hematuria, right side parathyroidectomy, hypercalcemia, and chronic back pain.  He was last seen on 09/23/2018 for cardiac evaluation prior to a pending urology procedure. At the time, patient was complaining of back pain and shortness of breath with excertion, but still able to perform house chores. He denied chest pain or palpitations. On examination, his SBP was elevated 150's-160's and experiencing bilateral lower extremity swelling. Lasix was increased to 80mg  BID for 3 days and instructed to record his BP BID. BMET performed with his Creatinine improving from 1.81-1.57 within a month.  Echo performed that showed EF 60-65% with elevated pulmonary artery systolic pressure of 57 mmHg.    He notes that since his last visit, he has had improvement in lower extremity swelling with return to baseline of trace to 1+ bilateral lower extremity edema.  Weight is down 4 pounds since his last visit.  He has chronic dyspnea on exertion which is relatively unchanged.  He has not been checking his blood pressure at home.  He notes  that he frequently eats out-most meals during the week.  He is aware that his weight is up 10 pounds since March and he would like to try and focus on some weight loss.  Activity is limited due to chronic back pain.  He denies PND, orthopnea, chest pain, palpitations, dizziness, syncope, or early satiety.  Home Medications    Prior to Admission medications   Medication Sig Start Date End Date Taking? Authorizing Provider  allopurinol (ZYLOPRIM) 300 MG tablet TAKE 1 TABLET BY MOUTH DAILY 04/14/18   Ria Bush, MD  ALPRAZolam Duanne Moron) 1 MG tablet TAKE ONE TABLET BY MOUTH AT BEDTIME AS NEEDED FOR SLEEP 09/11/18   Ria Bush, MD  amLODipine (NORVASC) 2.5 MG tablet Take 2.5 mg by mouth daily.    [provider]  aspirin 81 MG chewable tablet Chew 81 mg by mouth daily.    [provider]  Cholecalciferol (VITAMIN D3) 1.25 MG (50000 UT) TABS Take 1 tablet by mouth once a week. 03/20/18   Ria Bush, MD  colchicine (COLCRYS) 0.6 MG tablet TAKE 1 TABLET BY MOUTH EVERY HOUR UNTIL SYMPTOMS ARE BETTER, MAX OF 6TABS PER DAY. STOP IF DIARRHEA Patient taking differently: TAKE 1 TABLET BY MOUTH EVERY HOUR UNTIL SYMPTOMS ARE BETTER AS NEEDED, MAX OF 6TABS PER DAY. STOP IF DIARRHEA 01/13/17   Renato Shin, MD  fluticasone [facility]) 50 MCG/ACT nasal spray Place 2 sprays into both nostrils daily. 04/28/18   Ria Bush, MD  furosemide (LASIX) 80 MG tablet Take 1 tablet (80 mg total) by mouth daily. 12/19/17   Ria Bush, MD  gabapentin (NEURONTIN) 600 MG tablet TAKE TWO TABLETS (1200MG ) BY MOUTH AT BEDTIME 09/11/18   Ria Bush, MD  glucose blood (GE100 BLOOD GLUCOSE TEST) test strip 1 each by Other route 4 (four) times daily. E11.21 09/04/18   Renato Shin, MD  HYDROcodone-acetaminophen [facility]) 10-325 MG tablet Take 1 tablet by mouth every 6 (six) hours as needed for moderate pain. 12/19/17   Ria Bush, MD  insulin NPH Human (HUMULIN N) 100 UNIT/ML injection  Inject 2.9 mLs (290 Units total) into the skin at bedtime. 06/04/18   Renato Shin, MD  insulin regular (NOVOLIN R RELION) 100 units/mL injection INJECT 210 UNITS INTO THE SKIN BEFORE BREAKFAST, INJECT 170 UNITS BEFORE LUNCH, AND INJECT 270 UNITS BEFORE DINNER. (JUST BEFORE EACH MEAL) 08/04/18   Renato Shin, MD  losartan (COZAAR) 100 MG tablet TAKE ONE TABLET BY MOUTH AT BEDTIME 06/18/18   Ria Bush, MD  lovastatin (MEVACOR) 40 MG tablet TAKE 2 TABLETS BY MOUTH (80MG  TOTAL) AT BEDTIME 08/14/18   Tonia Ghent, MD  Naphazoline-Pheniramine (OPCON-A OP) Place 1 drop into both eyes 2 (two) times daily as needed.    [provider]  ondansetron (ZOFRAN) 4 MG tablet Take 1 tablet (4 mg total) by mouth every 4 (four) hours as needed for nausea. 08/10/18   Lesleigh Noe, MD  Sennosides-Docusate Sodium (STOOL SOFTENER & LAXATIVE PO) Take 2 tablets by mouth at bedtime.  [provider]  vitamin C (ASCORBIC ACID) 500 MG tablet Take 1 tablet (500 mg total) by mouth daily. 12/19/17   Ria Bush, MD    Review of Systems    Stable/chronic dyspnea on exertion.  Lower extremity edema is back to baseline.  He notes some left lateral foot pain if he is on his feet all day.  He denies chest pain, palpitations, pnd, orthopnea, n, v, dizziness, syncope, weight gain, or early satiety.  All other systems reviewed and are otherwise negative except as noted above.    Physical Exam    VS:  BP (!) 148/62 (BP Location: Left Arm, Patient Position: Sitting, Cuff Size: Normal)    Pulse (!) 55    Temp (!) 96.7 F (35.9 C)    Ht 5' 5.5" (1.664 m)    Wt 280 lb (127 kg)    BMI 45.89 kg/m  , BMI Body mass index is 45.89 kg/m. GEN: Obese,well nourished, well developed, in no acute distress. HEENT: normal. Neck: Supple, difficult to assess JVD due to body habitus, no carotid bruits, or masses. Cardiac: RRR, no murmurs, rubs, or gallops. No clubbing, cyanosis, 1+ bilateral lower extremity edema.  DP/PT 1+ and equal bilaterally.  Respiratory:  Respirations regular and unlabored, clear to auscultation bilaterally. GI: Obese, soft, nontender, nondistended, BS + x 4. MS: no deformity or atrophy. MAE x 4 with difficultly.   Skin: warm and dry, no rash. Neuro:  Strength and sensation are intact. Psych: Normal affect.  Accessory Clinical Findings    ECG personally reviewed by me today -sinus bradycardia, 55, first-degree AV block, nonspecific ST and T changes- no acute changes.  Lab Results  Component Value Date   WBC 7.6 08/10/2018   HGB 13.4 08/10/2018   HCT 40.6 08/10/2018   MCV 88.3 08/10/2018   PLT 184.0 08/10/2018   Lab Results  Component Value Date   CREATININE 1.57 (H) 10/01/2018   BUN 37 (H) 10/01/2018   NA 145 (H) 10/01/2018   K 4.7 10/01/2018   CL 103 10/01/2018   CO2 26 10/01/2018   Lab Results  Component Value Date   ALT 13 08/10/2018   AST 11 08/10/2018   ALKPHOS 87 08/10/2018   BILITOT 1.0 08/10/2018   Lab Results  Component Value Date   CHOL 103 03/18/2018   HDL 27.30 (L) 03/18/2018   LDLCALC 37 03/18/2018   LDLDIRECT 52.2 05/25/2009   TRIG 190.0 (H) 03/18/2018   CHOLHDL 4 03/18/2018     Assessment & Plan   1.  HFpEF: Evaluated earlier this month with increasing lower extremity edema and dyspnea on exertion.  Prior echo this year showed normal LV function.  He responded well to an additional dose of Lasix x3 days after his last visit and his weight is down 4 pounds.  He has been trying to be more careful with his caloric intake but eats fast food for most of his meals during the week.  We had a long discussion about the importance of sodium restriction and making better food choices.  On examination, he continues to have trace to 1+ bilateral lower extremity edema.  In the setting of stage III chronic kidney disease, I recommended that he really work on reducing his sodium intake and weight loss so that we do not have to increase or change his diuretic  dose.  Encouraged him to keep his legs elevated during the day or even consider compression socks.  Blood pressure remains elevated at 148/62.  I have asked to increase his amlodipine to 5 mg daily and have warned that he may need to look out for worsening ankle edema in that setting.  He otherwise remains on losartan.  2.  Essential HTN: Pressure remains elevated.  Increasing amlodipine to 5 mg daily.  Continue losartan.  Discussed the importance of reducing salt intake.  3. Morbid Obesity: Encouraged weight loss by either exercise and dietary modications. Patient states he is unable to exercise due to his back pain.  I have asked him to make better dietary decisions and try and reduce caloric intake if increasing activity is not an option.  4. CKD 3: Renal function has improved. Cr 1.57 from 1.81 a month.  Continue current dose of Lasix and losartan.  He is followed by nephrology in [place].  5.  Insulin-dependent diabetes mellitus: Followed by primary care.  A1c was 7.7 in July.  6.  Nephrolithiasis: Pending surgical intervention for large renal stone.  He is to continue antihypertensives and statin throughout the perioperative period.  No additional ischemic evaluation is warranted at this time.    7.  HL:  LDL 37 on lovastatin.  8.  Nonobstructive CAD: No chest pain.  Continue aspirin and statin therapy.  9.  Disposition: Follow-up in clinic in 3 months or sooner if necessary.  Murray Hodgkins, NP 10/08/2018, 9:53 AM

## 2018-10-09 NOTE — Telephone Encounter (Signed)
Left message with [facility] surgery scheduler along with my personal phone number. Re-faxed cardiac clearance to [facility]

## 2018-10-09 NOTE — Telephone Encounter (Signed)
Please call with status of clearance

## 2018-10-10 ENCOUNTER — Other Ambulatory Visit: Payer: Self-pay | Admitting: Family Medicine

## 2018-10-12 ENCOUNTER — Telehealth: Payer: Self-pay | Admitting: Family Medicine

## 2018-10-12 NOTE — Telephone Encounter (Signed)
Spoke with Larene Beach who is doing nurse visits that day (as of right now) and ok to come at 10 am for B12 instead. Patient advised

## 2018-10-12 NOTE — Telephone Encounter (Signed)
Best number [phone]  Pt has b12 inj appointment 02/7 @ he has a conflict with another appointment can pt be worked in early in the day.

## 2018-10-12 NOTE — Telephone Encounter (Signed)
LOV 08/12/2018 Pre op Next appointment on 12/18/2018 Follow up Last refilled on 04/14/2018 #30 with 5 refills

## 2018-10-13 DIAGNOSIS — N200 Calculus of kidney: Secondary | ICD-10-CM | POA: Diagnosis not present

## 2018-10-14 ENCOUNTER — Other Ambulatory Visit: Payer: Self-pay | Admitting: Urology

## 2018-10-14 NOTE — Patient Instructions (Addendum)
DUE TO COVID-19 ONLY ONE VISITOR IS ALLOWED TO COME WITH YOU AND STAY IN THE WAITING ROOM ONLY DURING PRE OP AND PROCEDURE DAY OF SURGERY. THE 1 VISITOR MAY VISIT WITH YOU AFTER SURGERY IN YOUR PRIVATE ROOM DURING VISITING HOURS ONLY!  YOU NEED TO HAVE A COVID 19 TEST ON__Thursday 10/01/2020_____ @__240  pm_____, THIS TEST MUST BE DONE BEFORE SURGERY, COME  [place] [place] [address] , [id number].  ([place]) ONCE YOUR COVID TEST IS COMPLETED, PLEASE BEGIN THE QUARANTINE INSTRUCTIONS AS OUTLINED IN YOUR HANDOUT.                [place]     Your procedure is scheduled on: Monday 10/19/2018   Report to [facility] Main  Entrance             Report to Radiology at 0800  AM             Please bring CPAP mask and tubing with you to the hospital!   How to Manage Your Diabetes Before and After Surgery  Why is it important to control my blood sugar before and after surgery? . Improving blood sugar levels before and after surgery helps healing and can limit problems. . A way of improving blood sugar control is eating a healthy diet by: o  Eating less sugar and carbohydrates o  Increasing activity/exercise o  Talking with your doctor about reaching your blood sugar goals . High blood sugars (greater than 180 mg/dL) can raise your risk of infections and slow your recovery, so you will need to focus on controlling your diabetes during the weeks before surgery. . Make sure that the doctor who takes care of your diabetes knows about your planned surgery including the date and location.  How do I manage my blood sugar before surgery? . Check your blood sugar at least 4 times a day, starting 2 days before surgery, to make sure that the level is not too high or low. o Check your blood sugar the morning of your surgery when you wake up and every 2 hours until you get to the Short Stay unit. . If your blood sugar is less than 70 mg/dL, you will need to treat for low  blood sugar: o Do not take insulin. o Treat a low blood sugar (less than 70 mg/dL) with  cup of clear juice (cranberry or apple), 4 glucose tablets, OR glucose gel. o Recheck blood sugar in 15 minutes after treatment (to make sure it is greater than 70 mg/dL). If your blood sugar is not greater than 70 mg/dL on recheck, call [phone] for further instructions. . Report your blood sugar to the short stay nurse when you get to Short Stay.  . If you are admitted to the hospital after surgery: o Your blood sugar will be checked by the staff and you will probably be given insulin after surgery (instead of oral diabetes medicines) to make sure you have good blood sugar levels. o The goal for blood sugar control after surgery is 80-180 mg/dL.   WHAT DO I DO ABOUT MY DIABETES MEDICATION?  Marland Kitchen Do not take oral diabetes medicines (pills) the morning of surgery.  . THE NIGHT BEFORE SURGERY, take   145   units of   Humulin N   insulin.        Use the scale below for the Novolin R insulin- Please call today Dr. Loanne Drilling and get instructions from him for the  sliding scale insulin!!!! ( instructions for the sliding scale the night before surgery and the morning of surgery!  . If your CBG is greater than 220 mg/dL, you may take  of your sliding scale  . (correction) dose of insulin.      Call this number if you have problems the morning of surgery [phone] or Radiology [phone]   Remember: Do not eat food or drink liquids :After Midnight.               BRUSH YOUR TEETH MORNING OF SURGERY AND RINSE YOUR MOUTH OUT, NO CHEWING GUM CANDY OR MINTS.     Take these medicines the morning of surgery with A SIP OF WATER: use Flonase nasal spray if needed, use Hydrocodone-acetaminophen if needed for pain              DO NOT TAKE ANY DIABETIC MEDICATIONS DAY OF YOUR SURGERY!                               You may not have any metal on your body including hair pins and              piercings  Do  not wear jewelry, make-up, lotions, powders or perfumes, deodorant                         Men may shave face and neck.   Do not bring valuables to the hospital. [place].  Contacts, dentures or bridgework may not be worn into surgery.  Leave suitcase in the car. After surgery it may be brought to your room.                  Please read over the following fact sheets you were given: _____________________________________________________________________             [facility] - Preparing for Surgery Before surgery, you can play an important role.  Because skin is not sterile, your skin needs to be as free of germs as possible.  You can reduce the number of germs on your skin by washing with CHG (chlorahexidine gluconate) soap before surgery.  CHG is an antiseptic cleaner which kills germs and bonds with the skin to continue killing germs even after washing. Please DO NOT use if you have an allergy to CHG or antibacterial soaps.  If your skin becomes reddened/irritated stop using the CHG and inform your nurse when you arrive at Short Stay. Do not shave (including legs and underarms) for at least 48 hours prior to the first CHG shower.  You may shave your face/neck. Please follow these instructions carefully:  1.  Shower with CHG Soap the night before surgery and the  morning of Surgery.  2.  If you choose to wash your hair, wash your hair first as usual with your  normal  shampoo.  3.  After you shampoo, rinse your hair and body thoroughly to remove the  shampoo.                           4.  Use CHG as you would any other liquid soap.  You can apply chg directly  to the skin and wash  Gently with a scrungie or clean washcloth.  5.  Apply the CHG Soap to your body ONLY FROM THE NECK DOWN.   Do not use on face/ open                           Wound or open sores. Avoid contact with eyes, ears mouth and genitals (private  parts).                       Wash face,  Genitals (private parts) with your normal soap.             6.  Wash thoroughly, paying special attention to the area where your surgery  will be performed.  7.  Thoroughly rinse your body with warm water from the neck down.  8.  DO NOT shower/wash with your normal soap after using and rinsing off  the CHG Soap.                9.  Pat yourself dry with a clean towel.            10.  Wear clean pajamas.            11.  Place clean sheets on your bed the night of your first shower and do not  sleep with pets. Day of Surgery : Do not apply any lotions/deodorants the morning of surgery.  Please wear clean clothes to the hospital/surgery center.  FAILURE TO FOLLOW THESE INSTRUCTIONS MAY RESULT IN THE CANCELLATION OF YOUR SURGERY PATIENT SIGNATURE_________________________________  NURSE SIGNATURE__________________________________  ________________________________________________________________________

## 2018-10-15 ENCOUNTER — Telehealth: Payer: Self-pay | Admitting: Endocrinology

## 2018-10-15 ENCOUNTER — Other Ambulatory Visit: Payer: Self-pay

## 2018-10-15 ENCOUNTER — Encounter (HOSPITAL_COMMUNITY)
Admission: RE | Admit: 2018-10-15 | Discharge: 2018-10-15 | Disposition: A | Discharge status: Home / Self Care | Payer: Medicare Other | Source: Ambulatory Visit | Attending: Urology | Admitting: Urology

## 2018-10-15 ENCOUNTER — Ambulatory Visit (INDEPENDENT_AMBULATORY_CARE_PROVIDER_SITE_OTHER): Payer: Medicare Other

## 2018-10-15 ENCOUNTER — Ambulatory Visit: Payer: Medicare Other

## 2018-10-15 ENCOUNTER — Encounter (HOSPITAL_COMMUNITY): Payer: Self-pay

## 2018-10-15 ENCOUNTER — Other Ambulatory Visit (HOSPITAL_COMMUNITY)
Admission: RE | Admit: 2018-10-15 | Discharge: 2018-10-15 | Disposition: A | Discharge status: Home / Self Care | Payer: Medicare Other | Source: Ambulatory Visit | Attending: Urology | Admitting: Urology

## 2018-10-15 DIAGNOSIS — I2510 Atherosclerotic heart disease of native coronary artery without angina pectoris: Secondary | ICD-10-CM | POA: Insufficient documentation

## 2018-10-15 DIAGNOSIS — I130 Hypertensive heart and chronic kidney disease with heart failure and stage 1 through stage 4 chronic kidney disease, or unspecified chronic kidney disease: Secondary | ICD-10-CM | POA: Diagnosis not present

## 2018-10-15 DIAGNOSIS — E1122 Type 2 diabetes mellitus with diabetic chronic kidney disease: Secondary | ICD-10-CM | POA: Insufficient documentation

## 2018-10-15 DIAGNOSIS — Z79899 Other long term (current) drug therapy: Secondary | ICD-10-CM | POA: Insufficient documentation

## 2018-10-15 DIAGNOSIS — N183 Chronic kidney disease, stage 3 unspecified: Secondary | ICD-10-CM | POA: Insufficient documentation

## 2018-10-15 DIAGNOSIS — Z794 Long term (current) use of insulin: Secondary | ICD-10-CM | POA: Insufficient documentation

## 2018-10-15 DIAGNOSIS — Z20828 Contact with and (suspected) exposure to other viral communicable diseases: Secondary | ICD-10-CM | POA: Insufficient documentation

## 2018-10-15 DIAGNOSIS — Z01812 Encounter for preprocedural laboratory examination: Secondary | ICD-10-CM | POA: Insufficient documentation

## 2018-10-15 DIAGNOSIS — I5032 Chronic diastolic (congestive) heart failure: Secondary | ICD-10-CM | POA: Insufficient documentation

## 2018-10-15 DIAGNOSIS — E538 Deficiency of other specified B group vitamins: Secondary | ICD-10-CM

## 2018-10-15 DIAGNOSIS — N200 Calculus of kidney: Secondary | ICD-10-CM | POA: Diagnosis not present

## 2018-10-15 HISTORY — DX: Dyspnea, unspecified: R06.00

## 2018-10-15 LAB — BASIC METABOLIC PANEL
Anion gap: 11 (ref 5–15)
BUN: 45 mg/dL — ABNORMAL HIGH (ref 8–23)
CO2: 28 mmol/L (ref 22–32)
Calcium: 9.2 mg/dL (ref 8.9–10.3)
Chloride: 101 mmol/L (ref 98–111)
Creatinine, Ser: 1.78 mg/dL — ABNORMAL HIGH (ref 0.61–1.24)
GFR calc Af Amer: 44 mL/min — ABNORMAL LOW (ref >60)
GFR calc non Af Amer: 38 mL/min — ABNORMAL LOW (ref >60)
Glucose, Bld: 215 mg/dL — ABNORMAL HIGH (ref 70–99)
Potassium: 4.4 mmol/L (ref 3.5–5.1)
Sodium: 140 mmol/L (ref 135–145)

## 2018-10-15 LAB — CBC
HCT: 43 % (ref 39.0–52.0)
Hemoglobin: 13.4 g/dL (ref 13.0–17.0)
MCH: 28.4 pg (ref 26.0–34.0)
MCHC: 31.2 g/dL (ref 30.0–36.0)
MCV: 91.1 fL (ref 80.0–100.0)
Platelets: 175 10*3/uL (ref 150–400)
RBC: 4.72 MIL/uL (ref 4.22–5.81)
RDW: 14.7 % (ref 11.5–15.5)
WBC: 7.2 10*3/uL (ref 4.0–10.5)
nRBC: 0 % (ref 0.0–0.2)

## 2018-10-15 LAB — HEMOGLOBIN A1C
Hgb A1c MFr Bld: 7.9 % — ABNORMAL HIGH (ref 4.8–5.6)
Mean Plasma Glucose: 180.03 mg/dL

## 2018-10-15 LAB — GLUCOSE, CAPILLARY: Glucose-Capillary: 237 mg/dL — ABNORMAL HIGH (ref 70–99)

## 2018-10-15 MED ORDER — CYANOCOBALAMIN 1000 MCG/ML IJ SOLN
1000.0000 ug | Freq: Once | INTRAMUSCULAR | Status: AC
  Administered 2018-10-15: 1000 ug via INTRAMUSCULAR

## 2018-10-15 NOTE — Telephone Encounter (Signed)
Patient is having kidney stone surgery this Monday at [facility]. Patient states his doctor would like Dr.Ellisons take on what the patient should do the night before and the morning of for his insulin.   Please Advise, Thanks

## 2018-10-15 NOTE — Progress Notes (Signed)
Called the Diabetic Co-ordinator , Harvel Ricks, RN and informed her of patient's surgery date and time of surgery. Informed Her that I instructed patient to call his Endocrinologist and get instructions from him for administering his sliding scale insulin  (Novolin R) the night before surgery and the morning of surgery.

## 2018-10-15 NOTE — Progress Notes (Signed)
Per orders of Dr. Gutierrez, injection of monthly B12 given by Terius Jacuinde V Daeja Helderman. Patient tolerated injection well.  

## 2018-10-15 NOTE — Progress Notes (Addendum)
PCP -Dr. Ria Bush   Cardiologist - Dr. Ida Rogue  LOV- 10/08/2018 with Murray Hodgkins, NP 10/02/2018- Cardiac Clearance from Dr. Ida Rogue on chart.  Nephrology- Dr. Donato Heinz  [facility]  Chest x-ray - 08/12/2018 03/27/2018- VAS US carotid duplex bilateral (eval. Carotid stenosis in bruits)  EKG - 10/08/2018 Stress Test - sometime in 2018 before parathyroidectomy surgery ECHO - 10/01/2018 Cardiac Cath - 7 or 8 years ago  Sleep Study - 06/26/1998 at [facility] by Dr. Loletha Carrow 03/28/2010 [facility] Dr. Devona Konig  CPAP - patient uses but does not know the settings  Fasting Blood Sugar - 140-160 Checks Blood Sugar _2-3____ times a day Called the Diabetic co-ordinator, Harvel Ricks, RN and informed her of date and time of surgery and that patient was to call his Endocrinologist , Dr. Loanne Drilling and get instructions from him about administering his sliding scale insulin the night before surgery and the morning of surgery.  Blood Thinner Instructions:None Aspirin Instructions:Aspirin 81 mg -per Dr. Diona Fanti, patient to stop Aspirin 5 days prior to surgery. Last Dose:10/13/2018  Anesthesia review:  Chart given to Iver Nestle, PA for review. Patient has history of-Pulmonary Embolism, DVT, OSA, Diabetes Type 2, HTN, Heart murmur, non-obstructive CAD   Patient denies shortness of breath, fever, cough and chest pain at PAT appointment   Patient verbalized understanding of instructions that were given to them at the PAT appointment. Patient was also instructed that they will need to review over the PAT instructions again at home before surgery.

## 2018-10-16 ENCOUNTER — Other Ambulatory Visit: Payer: Self-pay | Admitting: Radiology

## 2018-10-16 LAB — ABO/RH: ABO/RH(D): A NEG

## 2018-10-16 LAB — NOVEL CORONAVIRUS, NAA (HOSP ORDER, SEND-OUT TO REF LAB; TAT 18-24 HRS): SARS-CoV-2, NAA: NOT DETECTED

## 2018-10-16 NOTE — Telephone Encounter (Signed)
LVM requesting returned call 

## 2018-10-16 NOTE — Telephone Encounter (Signed)
The night before and after, take just 220 units of NPH The day of surgery, skip insulin until eating again.  When you resume eating, take just 1/2 of reg insulin until diet returns to normal.  Then resume usual insulin.

## 2018-10-16 NOTE — Anesthesia Preprocedure Evaluation (Addendum)
Anesthesia Evaluation  Patient identified by MRN, date of birth, ID band Patient awake    Reviewed: Allergy & Precautions, NPO status , Patient's Chart, lab work & pertinent test results  History of Anesthesia Complications Negative for: history of anesthetic complications  Airway Mallampati: IV  TM Distance: >3 FB Neck ROM: Limited    Dental  (+) Dental Advisory Given   Pulmonary sleep apnea and Continuous Positive Airway Pressure Ventilation ,  10/15/2018 SARS CoV2 NEG   Pulmonary exam normal breath sounds clear to auscultation       Cardiovascular hypertension, Pt. on medications (-) angina+ CAD ('12 cath: non-obstructive)  + Valvular Problems/Murmurs (mild AS, peak grad 24 mmHg)  Rhythm:Regular Rate:Normal  10/01/2018 ECHO: EF 60-65%, mild MR, mild TR, mild AS   Neuro/Psych Anxiety Depression Chronic back pain: narcotics    GI/Hepatic Neg liver ROS, GERD  Medicated and Controlled,  Endo/Other  diabetes (glu 140), Insulin DependentMorbid obesity  Renal/GU Renal InsufficiencyRenal disease     Musculoskeletal  (+) Arthritis ,   Abdominal (+) + obese,   Peds  Hematology negative hematology ROS (+)   Anesthesia Other Findings   Reproductive/Obstetrics                          Anesthesia Physical Anesthesia Plan  ASA: III  Anesthesia Plan: General   Post-op Pain Management:    Induction: Intravenous  PONV Risk Score and Plan: 2 and Ondansetron and Dexamethasone  Airway Management Planned: Oral ETT and Video Laryngoscope Planned  Additional Equipment:   Intra-op Plan:   Post-operative Plan: Extubation in OR  Informed Consent: I have reviewed the patients History and Physical, chart, labs and discussed the procedure including the risks, benefits and alternatives for the proposed anesthesia with the patient or authorized representative who has indicated his/her understanding and  acceptance.     Dental advisory given  Plan Discussed with: CRNA and Surgeon  Anesthesia Plan Comments: (See PAT note 10/15/2018, Konrad Felix, PA-C )      Anesthesia Quick Evaluation

## 2018-10-16 NOTE — Telephone Encounter (Signed)
We have been trying to call pt to inform him of Dr. Cordelia Pen instructions and have not been able to reach him. I am sending you these instructions to ensure you are aware of Dr. Cordelia Pen recommendations.

## 2018-10-16 NOTE — Telephone Encounter (Signed)
Please advise 

## 2018-10-16 NOTE — Progress Notes (Signed)
Anesthesia Chart Review   Case: [id number] Date/Time: 10/19/18 1045   Procedures:      NEPHROLITHOTOMY PERCUTANEOUS (Left ) - 3 HRS     HOLMIUM LASER APPLICATION (Left )   Anesthesia type: General   Pre-op diagnosis: LEFT RENAL CALCULUS   Location: [facility]   Surgeon: Franchot Gallo, MD      DISCUSSION:72 y.o. never smoker with h/o DM II, HTN, DVT, PE 2008, GERD, heart failure, OSA w/CPAP, asthma, CKD Stage III, nonobstructive CAD, left renal calculus scheduled for above procedure 10/19/2018 with Dr. Franchot Gallo.    Per endocrinologist, Dr. Renato Shin, 10/16/2018, "The night before and after, take just 220 units of NPH. The day of surgery, skip insulin until eating again.  When you resume eating, take just 1/2 of reg insulin until diet returns to normal.  Then resume usual insulin."  Pt last seen by cardiologist, Dr. Murray Hodgkins, 10/08/2018.  Per OV note, "Pending surgical intervention for large renal stone.  He is to continue antihypertensives and statin throughout the perioperative period.  No additional ischemic evaluation is warranted at this time."  Anticipate pt can proceed with planned procedure barring acute status change.   VS: BP (!) 163/43 (BP Location: Left Arm)   Pulse (!) 56   Temp 36.8 C (Oral)   Resp 18   Ht 5' 5.5" (1.664 m)   Wt 128.8 kg   SpO2 99%   BMI 46.55 kg/m   PROVIDERS: Ria Bush, MD is PCP   Renato Shin, MD is Endocrinologist  LABS: Labs reviewed: Acceptable for surgery. (all labs ordered are listed, but only abnormal results are displayed)  Labs Reviewed  BASIC METABOLIC PANEL - Abnormal; Notable for the following components:      Result Value   Glucose, Bld 215 (*)    BUN 45 (*)    Creatinine, Ser 1.78 (*)    GFR calc non Af Amer 38 (*)    GFR calc Af Amer 44 (*)    All other components within normal limits  HEMOGLOBIN A1C - Abnormal; Notable for the following components:   Hgb A1c MFr Bld 7.9 (*)     All other components within normal limits  GLUCOSE, CAPILLARY - Abnormal; Notable for the following components:   Glucose-Capillary 237 (*)    All other components within normal limits  CBC  TYPE AND SCREEN  ABO/RH     IMAGES:   EKG: 10/08/2018 Rate 55 bpm Sinus bradycardia with 1st degree AV block  Possible inferior infarct, age undetermined   CV: Echo 10/01/2018 IMPRESSIONS   1. Left ventricular ejection fraction, by visual estimation, is 60 to 65%. The left ventricle has normal function. Normal left ventricular size. There is no left ventricular hypertrophy.  2. Left ventricular diastolic Doppler parameters are indeterminate pattern of LV diastolic filling.  3. Global right ventricle has normal systolic function.The right ventricular size is mildly enlarged. No increase in right ventricular wall thickness.  4. Moderately elevated pulmonary artery systolic pressure 57 mm Hg.  5. Left atrial size was mild-moderately dilated.  6. Mild mitral valve regurgitation.  7. Tricuspid valve regurgitation is mild.  8. Aortic valve is not well visualized, appears moderately calcified. Mild aortic valve stenosis.  9. The inferior vena cava is dilated in size with >50% respiratory variability, suggesting right atrial pressure of >8 mmHg.   Myocardial perfusion imaging 03/13/2016 Pharmacological myocardial perfusion imaging study with no significant  ischemia There is a small region of  mild fixed perfusion defect in the inferoapical/apical region. Unable to exclude small region of old infarct vs attenuation artifact. Nonattenuation corrected images with large region of fixed inferior wall perfusion defect consistent with diaphragmatic attenuation artifact. Small region of possible hypokinesis/wall motion abnormality in the basal inferoseptal wall, EF estimated at 40% No EKG changes concerning for ischemia at peak stress or in recovery. Resting EKG with T wave abn in V5 and V6 Low risk  scan Past Medical History:  Diagnosis Date  . (HFpEF) heart failure with preserved ejection fraction ([place])    a. 09/2018 Echo: EF 60-65%. PASP 69mmHg. Mild-mod LAE. Mild MR/TR.  Marland Kitchen ANEMIA-IRON DEFICIENCY 07/26/2006  . Anxiety   . ASTHMA 07/26/2006  . Asthma   . Back pain, chronic   . Bell's palsy   . CKD (chronic kidney disease), stage III   . COLONIC POLYPS, HX OF 07/26/2006  . DEPRESSION 03/14/2009  . DIABETES MELLITUS, TYPE II 07/26/2006  . [place] DISEASE, LUMBAR 10/05/2007  . DVT 12/03/2007  . DYSLIPIDEMIA 04/13/2009  . Dyspnea    when gets up and walks around and back is hurting really bad -only Shortness of breath  then  . GERD 07/26/2006  . Gout   . Heart murmur   . History of kidney stones   . HYPERTENSION 07/26/2006  . INSOMNIA 08/21/2007  . Neuropathy   . Nonobstructive CAD (coronary artery disease)    a. 2012 Cath: no high grade stenosis; b. 2018 MV: No ischemia. Attenuation artifact.   . OBSTRUCTIVE SLEEP APNEA 12/03/2007   Use C-PAP  . PERIPHERAL NEUROPATHY 07/26/2006  . Pernicious anemia 11/20/2006  . PULMONARY EMBOLISM 10/05/2007    Past Surgical History:  Procedure Laterality Date  . APPENDECTOMY  1968  . [place], 06/14/2008   Dr. Trenton Gammon at [facility] (06/10)  . CARDIAC CATHETERIZATION    . CARPAL TUNNEL RELEASE Right 10/2017  . CATARACT EXTRACTION W/PHACO Left 05/30/2014   Procedure: CATARACT EXTRACTION PHACO AND INTRAOCULAR LENS PLACEMENT (IOC);  Surgeon: Estill Cotta, MD;  Location: [facility] ORS;  Service: Ophthalmology;  Laterality: Left;  [name] 01:20 AP% 23.7 CDE 31.86  . COLONOSCOPY  10/2002   HP, SSA, TA, rpt 3 yrs ([place])  . COLONOSCOPY  01/2005   diverticulosis rpt 5 yrs ([facility])   . LITHOTRIPSY     X 2  . PARATHYROIDECTOMY Right 04/18/2016   PARATHYROIDECTOMY for cyst [name], MD)  . SHOULDER ARTHROSCOPY W/ ROTATOR CUFF REPAIR Right   . TONSILLECTOMY      MEDICATIONS: . allopurinol (ZYLOPRIM) 300 MG tablet  . ALPRAZolam (XANAX)  1 MG tablet  . amLODipine (NORVASC) 5 MG tablet  . aspirin EC 81 MG tablet  . colchicine (COLCRYS) 0.6 MG tablet  . cyanocobalamin (,VITAMIN B-12,) 1000 MCG/ML injection  . fluticasone (FLONASE) 50 MCG/ACT nasal spray  . furosemide (LASIX) 80 MG tablet  . gabapentin (NEURONTIN) 600 MG tablet  . glucose blood (GE100 BLOOD GLUCOSE TEST) test strip  . HYDROcodone-acetaminophen (NORCO) 10-325 MG tablet  . insulin NPH Human (HUMULIN N) 100 UNIT/ML injection  . insulin regular (NOVOLIN R RELION) 100 units/mL injection  . losartan (COZAAR) 100 MG tablet  . lovastatin (MEVACOR) 40 MG tablet  . Naphazoline-Pheniramine ([place] [facility])  . ondansetron (ZOFRAN) 4 MG tablet  . Sennosides-Docusate Sodium (STOOL SOFTENER & LAXATIVE PO)   No current facility-administered medications for this encounter.      Maia Plan [facility] Pre-Surgical Testing [phone] 10/16/18  1:23 PM

## 2018-10-16 NOTE — Telephone Encounter (Signed)
SECOND ATTEMPT: ° °LVM requesting returned call. °

## 2018-10-18 MED ORDER — DEXTROSE 5 % IV SOLN
3.0000 g | INTRAVENOUS | Status: DC
  Filled 2018-10-18: qty 3000

## 2018-10-19 ENCOUNTER — Ambulatory Visit (HOSPITAL_COMMUNITY): Payer: Medicare Other | Admitting: Physician Assistant

## 2018-10-19 ENCOUNTER — Ambulatory Visit (HOSPITAL_COMMUNITY): Payer: Medicare Other

## 2018-10-19 ENCOUNTER — Other Ambulatory Visit: Payer: Self-pay

## 2018-10-19 ENCOUNTER — Ambulatory Visit (HOSPITAL_COMMUNITY)
Admission: RE | Admit: 2018-10-19 | Discharge: 2018-10-19 | Disposition: A | Discharge status: Home / Self Care | Payer: Medicare Other | Source: Ambulatory Visit | Attending: Urology | Admitting: Urology

## 2018-10-19 ENCOUNTER — Encounter (HOSPITAL_COMMUNITY)
Admission: RE | Disposition: A | Discharge status: Home / Self Care | Payer: Self-pay | Source: Home / Self Care | Attending: Urology

## 2018-10-19 ENCOUNTER — Inpatient Hospital Stay (HOSPITAL_COMMUNITY)
Admission: RE | Admit: 2018-10-19 | Discharge: 2018-10-21 | DRG: 694 | Disposition: A | Discharge status: Home / Self Care | Payer: Medicare Other | Attending: Urology | Admitting: Urology

## 2018-10-19 ENCOUNTER — Encounter (HOSPITAL_COMMUNITY): Payer: Self-pay

## 2018-10-19 ENCOUNTER — Ambulatory Visit (HOSPITAL_COMMUNITY): Payer: Medicare Other | Admitting: Anesthesiology

## 2018-10-19 DIAGNOSIS — Z833 Family history of diabetes mellitus: Secondary | ICD-10-CM

## 2018-10-19 DIAGNOSIS — Z6841 Body Mass Index (BMI) 40.0 and over, adult: Secondary | ICD-10-CM

## 2018-10-19 DIAGNOSIS — Z79891 Long term (current) use of opiate analgesic: Secondary | ICD-10-CM

## 2018-10-19 DIAGNOSIS — Z87442 Personal history of urinary calculi: Secondary | ICD-10-CM

## 2018-10-19 DIAGNOSIS — I129 Hypertensive chronic kidney disease with stage 1 through stage 4 chronic kidney disease, or unspecified chronic kidney disease: Secondary | ICD-10-CM | POA: Diagnosis not present

## 2018-10-19 DIAGNOSIS — K219 Gastro-esophageal reflux disease without esophagitis: Secondary | ICD-10-CM | POA: Insufficient documentation

## 2018-10-19 DIAGNOSIS — J45909 Unspecified asthma, uncomplicated: Secondary | ICD-10-CM | POA: Insufficient documentation

## 2018-10-19 DIAGNOSIS — E785 Hyperlipidemia, unspecified: Secondary | ICD-10-CM | POA: Diagnosis present

## 2018-10-19 DIAGNOSIS — N200 Calculus of kidney: Secondary | ICD-10-CM | POA: Diagnosis not present

## 2018-10-19 DIAGNOSIS — Z539 Procedure and treatment not carried out, unspecified reason: Secondary | ICD-10-CM | POA: Diagnosis not present

## 2018-10-19 DIAGNOSIS — Z794 Long term (current) use of insulin: Secondary | ICD-10-CM

## 2018-10-19 DIAGNOSIS — G4733 Obstructive sleep apnea (adult) (pediatric): Secondary | ICD-10-CM | POA: Diagnosis present

## 2018-10-19 DIAGNOSIS — Z7982 Long term (current) use of aspirin: Secondary | ICD-10-CM

## 2018-10-19 DIAGNOSIS — I130 Hypertensive heart and chronic kidney disease with heart failure and stage 1 through stage 4 chronic kidney disease, or unspecified chronic kidney disease: Secondary | ICD-10-CM | POA: Insufficient documentation

## 2018-10-19 DIAGNOSIS — Z8719 Personal history of other diseases of the digestive system: Secondary | ICD-10-CM

## 2018-10-19 DIAGNOSIS — Z86711 Personal history of pulmonary embolism: Secondary | ICD-10-CM

## 2018-10-19 DIAGNOSIS — Z803 Family history of malignant neoplasm of breast: Secondary | ICD-10-CM

## 2018-10-19 DIAGNOSIS — I5032 Chronic diastolic (congestive) heart failure: Secondary | ICD-10-CM | POA: Diagnosis not present

## 2018-10-19 DIAGNOSIS — I2510 Atherosclerotic heart disease of native coronary artery without angina pectoris: Secondary | ICD-10-CM | POA: Insufficient documentation

## 2018-10-19 DIAGNOSIS — Z885 Allergy status to narcotic agent status: Secondary | ICD-10-CM

## 2018-10-19 DIAGNOSIS — G8929 Other chronic pain: Secondary | ICD-10-CM | POA: Diagnosis present

## 2018-10-19 DIAGNOSIS — Z888 Allergy status to other drugs, medicaments and biological substances status: Secondary | ICD-10-CM

## 2018-10-19 DIAGNOSIS — D62 Acute posthemorrhagic anemia: Secondary | ICD-10-CM | POA: Diagnosis not present

## 2018-10-19 DIAGNOSIS — Z86718 Personal history of other venous thrombosis and embolism: Secondary | ICD-10-CM | POA: Insufficient documentation

## 2018-10-19 DIAGNOSIS — Z79899 Other long term (current) drug therapy: Secondary | ICD-10-CM | POA: Insufficient documentation

## 2018-10-19 DIAGNOSIS — N183 Chronic kidney disease, stage 3 unspecified: Secondary | ICD-10-CM | POA: Insufficient documentation

## 2018-10-19 DIAGNOSIS — I509 Heart failure, unspecified: Secondary | ICD-10-CM | POA: Insufficient documentation

## 2018-10-19 DIAGNOSIS — E1122 Type 2 diabetes mellitus with diabetic chronic kidney disease: Secondary | ICD-10-CM | POA: Diagnosis present

## 2018-10-19 DIAGNOSIS — Z8249 Family history of ischemic heart disease and other diseases of the circulatory system: Secondary | ICD-10-CM

## 2018-10-19 DIAGNOSIS — F329 Major depressive disorder, single episode, unspecified: Secondary | ICD-10-CM | POA: Insufficient documentation

## 2018-10-19 HISTORY — PX: NEPHROLITHOTOMY: SHX5134

## 2018-10-19 HISTORY — PX: IR URETERAL STENT LEFT NEW ACCESS W/O SEP NEPHROSTOMY CATH: IMG6075

## 2018-10-19 HISTORY — PX: IR NEPHROSTOMY PLACEMENT LEFT: IMG6063

## 2018-10-19 LAB — CBC WITH DIFFERENTIAL/PLATELET
Abs Immature Granulocytes: 0.03 10*3/uL (ref 0.00–0.07)
Basophils Absolute: 0 10*3/uL (ref 0.0–0.1)
Basophils Relative: 1 %
Eosinophils Absolute: 0.3 10*3/uL (ref 0.0–0.5)
Eosinophils Relative: 3 %
HCT: 42.8 % (ref 39.0–52.0)
Hemoglobin: 13.7 g/dL (ref 13.0–17.0)
Immature Granulocytes: 0 %
Lymphocytes Relative: 21 %
Lymphs Abs: 1.7 10*3/uL (ref 0.7–4.0)
MCH: 28.6 pg (ref 26.0–34.0)
MCHC: 32 g/dL (ref 30.0–36.0)
MCV: 89.4 fL (ref 80.0–100.0)
Monocytes Absolute: 0.5 10*3/uL (ref 0.1–1.0)
Monocytes Relative: 6 %
Neutro Abs: 5.7 10*3/uL (ref 1.7–7.7)
Neutrophils Relative %: 69 %
Platelets: 186 10*3/uL (ref 150–400)
RBC: 4.79 MIL/uL (ref 4.22–5.81)
RDW: 14.9 % (ref 11.5–15.5)
WBC: 8.2 10*3/uL (ref 4.0–10.5)
nRBC: 0 % (ref 0.0–0.2)

## 2018-10-19 LAB — TYPE AND SCREEN
ABO/RH(D): A NEG
Antibody Screen: NEGATIVE

## 2018-10-19 LAB — BASIC METABOLIC PANEL
Anion gap: 13 (ref 5–15)
BUN: 33 mg/dL — ABNORMAL HIGH (ref 8–23)
CO2: 27 mmol/L (ref 22–32)
Calcium: 9.6 mg/dL (ref 8.9–10.3)
Chloride: 103 mmol/L (ref 98–111)
Creatinine, Ser: 1.54 mg/dL — ABNORMAL HIGH (ref 0.61–1.24)
GFR calc Af Amer: 52 mL/min — ABNORMAL LOW (ref >60)
GFR calc non Af Amer: 45 mL/min — ABNORMAL LOW (ref >60)
Glucose, Bld: 149 mg/dL — ABNORMAL HIGH (ref 70–99)
Potassium: 3.6 mmol/L (ref 3.5–5.1)
Sodium: 143 mmol/L (ref 135–145)

## 2018-10-19 LAB — GLUCOSE, CAPILLARY
Glucose-Capillary: 140 mg/dL — ABNORMAL HIGH (ref 70–99)
Glucose-Capillary: 272 mg/dL — ABNORMAL HIGH (ref 70–99)
Glucose-Capillary: 269 mg/dL — ABNORMAL HIGH (ref 70–99)
Glucose-Capillary: 248 mg/dL — ABNORMAL HIGH (ref 70–99)

## 2018-10-19 LAB — PROTIME-INR
INR: 1 (ref 0.8–1.2)
Prothrombin Time: 13.4 seconds (ref 11.4–15.2)

## 2018-10-19 LAB — HEMOGLOBIN AND HEMATOCRIT, BLOOD
HCT: 39.5 % (ref 39.0–52.0)
Hemoglobin: 12.5 g/dL — ABNORMAL LOW (ref 13.0–17.0)

## 2018-10-19 SURGERY — NEPHROLITHOTOMY PERCUTANEOUS
Anesthesia: General | Laterality: Left

## 2018-10-19 MED ORDER — FUROSEMIDE 40 MG PO TABS
80.0000 mg | ORAL_TABLET | Freq: Every day | ORAL | Status: DC
  Administered 2018-10-20 – 2018-10-21 (×2): 80 mg via ORAL
  Filled 2018-10-19 (×3): qty 2

## 2018-10-19 MED ORDER — MIDAZOLAM HCL 2 MG/2ML IJ SOLN: 0.5000 mg | Freq: Once | INTRAMUSCULAR | Status: DC | PRN

## 2018-10-19 MED ORDER — PROPOFOL 10 MG/ML IV BOLUS
INTRAVENOUS | Status: AC
  Filled 2018-10-19: qty 20

## 2018-10-19 MED ORDER — PHENYLEPHRINE HCL (PRESSORS) 10 MG/ML IV SOLN
INTRAVENOUS | Status: AC
  Filled 2018-10-19: qty 2

## 2018-10-19 MED ORDER — SODIUM CHLORIDE 0.9 % IR SOLN
Status: DC | PRN
  Administered 2018-10-19: 9000 mL

## 2018-10-19 MED ORDER — 0.9 % SODIUM CHLORIDE (POUR BTL) OPTIME
TOPICAL | Status: DC | PRN
  Administered 2018-10-19: 1000 mL

## 2018-10-19 MED ORDER — ALLOPURINOL 300 MG PO TABS
300.0000 mg | ORAL_TABLET | Freq: Every day | ORAL | Status: DC
  Administered 2018-10-19 – 2018-10-20 (×2): 300 mg via ORAL
  Filled 2018-10-19 (×2): qty 1

## 2018-10-19 MED ORDER — INSULIN ASPART 100 UNIT/ML ~~LOC~~ SOLN
5.0000 [IU] | Freq: Once | SUBCUTANEOUS | Status: AC
  Administered 2018-10-19: 16:00:00 5 [IU] via SUBCUTANEOUS

## 2018-10-19 MED ORDER — EPHEDRINE SULFATE-NACL 50-0.9 MG/10ML-% IV SOSY
PREFILLED_SYRINGE | INTRAVENOUS | Status: DC | PRN
  Administered 2018-10-19 (×2): 10 mg via INTRAVENOUS

## 2018-10-19 MED ORDER — LOSARTAN POTASSIUM 50 MG PO TABS
100.0000 mg | ORAL_TABLET | Freq: Every day | ORAL | Status: DC
  Administered 2018-10-19 – 2018-10-20 (×2): 100 mg via ORAL
  Filled 2018-10-19 (×2): qty 2

## 2018-10-19 MED ORDER — FENTANYL CITRATE (PF) 100 MCG/2ML IJ SOLN
INTRAMUSCULAR | Status: AC
  Filled 2018-10-19: qty 4

## 2018-10-19 MED ORDER — HYDROMORPHONE HCL 1 MG/ML IJ SOLN
0.4000 mg | INTRAMUSCULAR | Status: DC | PRN
  Administered 2018-10-19 (×2): 0.4 mg via INTRAVENOUS

## 2018-10-19 MED ORDER — FENTANYL CITRATE (PF) 100 MCG/2ML IJ SOLN
INTRAMUSCULAR | Status: AC
  Filled 2018-10-19: qty 2

## 2018-10-19 MED ORDER — HYDROMORPHONE HCL 1 MG/ML IJ SOLN
INTRAMUSCULAR | Status: AC
  Administered 2018-10-19: 16:00:00 0.4 mg via INTRAVENOUS
  Filled 2018-10-19: qty 1

## 2018-10-19 MED ORDER — MEPERIDINE HCL 50 MG/ML IJ SOLN: 6.2500 mg | INTRAMUSCULAR | Status: DC | PRN

## 2018-10-19 MED ORDER — NAPHAZOLINE-PHENIRAMINE 0.025-0.3 % OP SOLN: 1.0000 [drp] | Freq: Three times a day (TID) | OPHTHALMIC | Status: DC | PRN

## 2018-10-19 MED ORDER — DEXAMETHASONE SODIUM PHOSPHATE 10 MG/ML IJ SOLN
INTRAMUSCULAR | Status: DC | PRN
  Administered 2018-10-19: 4 mg via INTRAVENOUS

## 2018-10-19 MED ORDER — EPHEDRINE 5 MG/ML INJ
INTRAVENOUS | Status: AC
  Filled 2018-10-19: qty 10

## 2018-10-19 MED ORDER — SODIUM CHLORIDE 0.45 % IV SOLN
INTRAVENOUS | Status: DC
  Administered 2018-10-19: 1000 mL via INTRAVENOUS
  Administered 2018-10-19 – 2018-10-20 (×2): via INTRAVENOUS

## 2018-10-19 MED ORDER — PRAVASTATIN SODIUM 20 MG PO TABS
10.0000 mg | ORAL_TABLET | Freq: Every day | ORAL | Status: DC
  Administered 2018-10-19 – 2018-10-21 (×3): 10 mg via ORAL
  Filled 2018-10-19 (×3): qty 1

## 2018-10-19 MED ORDER — HYDROMORPHONE HCL 1 MG/ML IJ SOLN
0.5000 mg | INTRAMUSCULAR | Status: DC | PRN
  Administered 2018-10-19 – 2018-10-20 (×3): 1 mg via INTRAVENOUS
  Filled 2018-10-19 (×3): qty 1

## 2018-10-19 MED ORDER — FENTANYL CITRATE (PF) 100 MCG/2ML IJ SOLN
INTRAMUSCULAR | Status: AC | PRN
  Administered 2018-10-19 (×3): 50 ug via INTRAVENOUS

## 2018-10-19 MED ORDER — ONDANSETRON HCL 4 MG PO TABS
4.0000 mg | ORAL_TABLET | ORAL | Status: DC | PRN
  Administered 2018-10-21: 4 mg via ORAL
  Filled 2018-10-19: qty 1

## 2018-10-19 MED ORDER — SUCCINYLCHOLINE CHLORIDE 200 MG/10ML IV SOSY
PREFILLED_SYRINGE | INTRAVENOUS | Status: DC | PRN
  Administered 2018-10-19: 100 mg via INTRAVENOUS

## 2018-10-19 MED ORDER — GLUCOSE BLOOD VI STRP: 1.0000 | ORAL_STRIP | Freq: Four times a day (QID) | Status: DC

## 2018-10-19 MED ORDER — INSULIN ASPART 100 UNIT/ML ~~LOC~~ SOLN
SUBCUTANEOUS | Status: AC
  Administered 2018-10-19: 5 [IU] via SUBCUTANEOUS
  Filled 2018-10-19: qty 1

## 2018-10-19 MED ORDER — CEFAZOLIN SODIUM-DEXTROSE 2-4 GM/100ML-% IV SOLN: 2.0000 g | INTRAVENOUS | Status: DC

## 2018-10-19 MED ORDER — CYANOCOBALAMIN 1000 MCG/ML IJ SOLN: 1000.0000 ug | INTRAMUSCULAR | Status: DC

## 2018-10-19 MED ORDER — LIDOCAINE-EPINEPHRINE (PF) 2 %-1:200000 IJ SOLN
INTRAMUSCULAR | Status: AC
  Filled 2018-10-19: qty 20

## 2018-10-19 MED ORDER — ALPRAZOLAM 0.5 MG PO TABS
0.5000 mg | ORAL_TABLET | Freq: Every day | ORAL | Status: DC
  Administered 2018-10-19 – 2018-10-20 (×2): 0.5 mg via ORAL
  Filled 2018-10-19 (×2): qty 1

## 2018-10-19 MED ORDER — FENTANYL CITRATE (PF) 100 MCG/2ML IJ SOLN
INTRAMUSCULAR | Status: AC
  Administered 2018-10-19: 50 ug via INTRAVENOUS
  Filled 2018-10-19: qty 2

## 2018-10-19 MED ORDER — IOHEXOL 300 MG/ML  SOLN
INTRAMUSCULAR | Status: DC | PRN
  Administered 2018-10-19: 120 mL

## 2018-10-19 MED ORDER — INSULIN ASPART 100 UNIT/ML ~~LOC~~ SOLN
0.0000 [IU] | Freq: Three times a day (TID) | SUBCUTANEOUS | Status: DC
  Administered 2018-10-19: 11 [IU] via SUBCUTANEOUS
  Administered 2018-10-20: 15 [IU] via SUBCUTANEOUS
  Administered 2018-10-20 – 2018-10-21 (×5): 11 [IU] via SUBCUTANEOUS

## 2018-10-19 MED ORDER — SENNOSIDES-DOCUSATE SODIUM 8.6-50 MG PO TABS
1.0000 | ORAL_TABLET | Freq: Every day | ORAL | Status: DC
  Administered 2018-10-19 – 2018-10-20 (×2): 1 via ORAL
  Filled 2018-10-19 (×2): qty 1

## 2018-10-19 MED ORDER — ROCURONIUM BROMIDE 10 MG/ML (PF) SYRINGE
PREFILLED_SYRINGE | INTRAVENOUS | Status: DC | PRN
  Administered 2018-10-19 (×2): 10 mg via INTRAVENOUS
  Administered 2018-10-19: 50 mg via INTRAVENOUS

## 2018-10-19 MED ORDER — AMLODIPINE BESYLATE 5 MG PO TABS
5.0000 mg | ORAL_TABLET | Freq: Every day | ORAL | Status: DC
  Administered 2018-10-20 – 2018-10-21 (×2): 5 mg via ORAL
  Filled 2018-10-19 (×2): qty 1

## 2018-10-19 MED ORDER — LIDOCAINE HCL (PF) 1 % IJ SOLN
INTRAMUSCULAR | Status: AC | PRN
  Administered 2018-10-19: 10 mL

## 2018-10-19 MED ORDER — LACTATED RINGERS IV SOLN
INTRAVENOUS | Status: DC
  Administered 2018-10-19 (×2): via INTRAVENOUS

## 2018-10-19 MED ORDER — SUGAMMADEX SODIUM 200 MG/2ML IV SOLN
INTRAVENOUS | Status: DC | PRN
  Administered 2018-10-19: 400 mg via INTRAVENOUS

## 2018-10-19 MED ORDER — ONDANSETRON HCL 4 MG/2ML IJ SOLN
INTRAMUSCULAR | Status: DC | PRN
  Administered 2018-10-19: 4 mg via INTRAVENOUS

## 2018-10-19 MED ORDER — PHENYLEPHRINE 40 MCG/ML (10ML) SYRINGE FOR IV PUSH (FOR BLOOD PRESSURE SUPPORT)
PREFILLED_SYRINGE | INTRAVENOUS | Status: AC
  Filled 2018-10-19: qty 10

## 2018-10-19 MED ORDER — OXYCODONE HCL 5 MG PO TABS
5.0000 mg | ORAL_TABLET | ORAL | Status: DC | PRN
  Administered 2018-10-19 – 2018-10-21 (×5): 5 mg via ORAL
  Filled 2018-10-19 (×5): qty 1

## 2018-10-19 MED ORDER — CEPHALEXIN 500 MG PO CAPS
500.0000 mg | ORAL_CAPSULE | Freq: Two times a day (BID) | ORAL | Status: DC
  Administered 2018-10-19 – 2018-10-21 (×4): 500 mg via ORAL
  Filled 2018-10-19 (×4): qty 1

## 2018-10-19 MED ORDER — MIDAZOLAM HCL 2 MG/2ML IJ SOLN
INTRAMUSCULAR | Status: AC
  Filled 2018-10-19: qty 6

## 2018-10-19 MED ORDER — IOHEXOL 300 MG/ML  SOLN
50.0000 mL | Freq: Once | INTRAMUSCULAR | Status: AC | PRN
  Administered 2018-10-19: 15 mL

## 2018-10-19 MED ORDER — LIDOCAINE 2% (20 MG/ML) 5 ML SYRINGE
INTRAMUSCULAR | Status: DC | PRN
  Administered 2018-10-19: 100 mg via INTRAVENOUS

## 2018-10-19 MED ORDER — PROPOFOL 10 MG/ML IV BOLUS
INTRAVENOUS | Status: DC | PRN
  Administered 2018-10-19: 200 mg via INTRAVENOUS

## 2018-10-19 MED ORDER — PROMETHAZINE HCL 25 MG/ML IJ SOLN: 6.2500 mg | INTRAMUSCULAR | Status: DC | PRN

## 2018-10-19 MED ORDER — MIDAZOLAM HCL 2 MG/2ML IJ SOLN
INTRAMUSCULAR | Status: AC | PRN
  Administered 2018-10-19 (×2): 1 mg via INTRAVENOUS

## 2018-10-19 MED ORDER — PHENYLEPHRINE 40 MCG/ML (10ML) SYRINGE FOR IV PUSH (FOR BLOOD PRESSURE SUPPORT)
PREFILLED_SYRINGE | INTRAVENOUS | Status: DC | PRN
  Administered 2018-10-19: 40 ug via INTRAVENOUS

## 2018-10-19 MED ORDER — ASPIRIN EC 81 MG PO TBEC
81.0000 mg | DELAYED_RELEASE_TABLET | Freq: Every day | ORAL | Status: DC
  Administered 2018-10-19 – 2018-10-20 (×2): 81 mg via ORAL
  Filled 2018-10-19 (×2): qty 1

## 2018-10-19 MED ORDER — GABAPENTIN 400 MG PO CAPS
1200.0000 mg | ORAL_CAPSULE | Freq: Every day | ORAL | Status: DC
  Administered 2018-10-19 – 2018-10-20 (×2): 1200 mg via ORAL
  Filled 2018-10-19 (×2): qty 3

## 2018-10-19 MED ORDER — SODIUM CHLORIDE 0.9% FLUSH
5.0000 mL | Freq: Three times a day (TID) | INTRAVENOUS | Status: DC
  Administered 2018-10-19 – 2018-10-21 (×3): 5 mL

## 2018-10-19 MED ORDER — FENTANYL CITRATE (PF) 100 MCG/2ML IJ SOLN
INTRAMUSCULAR | Status: DC | PRN
  Administered 2018-10-19: 50 ug via INTRAVENOUS
  Administered 2018-10-19: 25 ug via INTRAVENOUS

## 2018-10-19 MED ORDER — FENTANYL CITRATE (PF) 100 MCG/2ML IJ SOLN
25.0000 ug | INTRAMUSCULAR | Status: DC | PRN
  Administered 2018-10-19 (×3): 50 ug via INTRAVENOUS

## 2018-10-19 MED ORDER — SODIUM CHLORIDE 0.9 % IV SOLN
INTRAVENOUS | Status: DC | PRN
  Administered 2018-10-19: 25 ug/min via INTRAVENOUS

## 2018-10-19 MED ORDER — CEFAZOLIN SODIUM-DEXTROSE 2-4 GM/100ML-% IV SOLN
INTRAVENOUS | Status: AC
  Administered 2018-10-19: 2000 mg
  Filled 2018-10-19: qty 100

## 2018-10-19 SURGICAL SUPPLY — 59 items
APL SKNCLS STERI-STRIP NONHPOA (GAUZE/BANDAGES/DRESSINGS) ×1
BAG URINE DRAINAGE (UROLOGICAL SUPPLIES) ×1 IMPLANT
BASKET ZERO TIP NITINOL 2.4FR (BASKET) ×1 IMPLANT
BENZOIN TINCTURE PRP APPL 2/3 (GAUZE/BANDAGES/DRESSINGS) ×3 IMPLANT
BLADE SURG 15 STRL LF DISP TIS (BLADE) ×1 IMPLANT
BLADE SURG 15 STRL SS (BLADE) ×2
BSKT STON RTRVL ZERO TP 2.4FR (BASKET)
CATCHER STONE W/TUBE ADAPTER (UROLOGICAL SUPPLIES) IMPLANT
CATH FOLEY 2W COUNCIL 20FR 5CC (CATHETERS) IMPLANT
CATH INTERMIT  6FR 70CM (CATHETERS) ×1 IMPLANT
CATH MULTI PURPOSE 16FR DRAIN (CATHETERS) ×1 IMPLANT
CATH ROBINSON RED A/P 20FR (CATHETERS) IMPLANT
CATH UROLOGY TORQUE 40 (MISCELLANEOUS) ×1 IMPLANT
CATH X-FORCE N30 NEPHROSTOMY (TUBING) ×2 IMPLANT
CONT SPEC 4OZ CLIKSEAL STRL BL (MISCELLANEOUS) ×1 IMPLANT
COVER SURGICAL LIGHT HANDLE (MISCELLANEOUS) ×2 IMPLANT
COVER WAND RF STERILE (DRAPES) IMPLANT
DRAPE C-ARM 42X120 X-RAY (DRAPES) ×2 IMPLANT
DRAPE LINGEMAN PERC (DRAPES) ×2 IMPLANT
DRAPE SURG IRRIG POUCH 19X23 (DRAPES) ×2 IMPLANT
DRSG PAD ABDOMINAL 8X10 ST (GAUZE/BANDAGES/DRESSINGS) ×2 IMPLANT
DRSG TEGADERM 8X12 (GAUZE/BANDAGES/DRESSINGS) ×5 IMPLANT
EXTRACTOR STONE 1.7FRX115CM (UROLOGICAL SUPPLIES) ×1 IMPLANT
FIBER LASER FLEXIVA 1000 (UROLOGICAL SUPPLIES) IMPLANT
FIBER LASER FLEXIVA 365 (UROLOGICAL SUPPLIES) IMPLANT
FIBER LASER FLEXIVA 550 (UROLOGICAL SUPPLIES) IMPLANT
FIBER LASER TRAC TIP (UROLOGICAL SUPPLIES) IMPLANT
GAUZE SPONGE 4X4 12PLY STRL (GAUZE/BANDAGES/DRESSINGS) ×1 IMPLANT
GLOVE BIO SURGEON STRL SZ7.5 (GLOVE) ×1 IMPLANT
GLOVE BIOGEL M 8.0 STRL (GLOVE) ×2 IMPLANT
GLOVE BIOGEL PI IND STRL 7.5 (GLOVE) IMPLANT
GLOVE BIOGEL PI INDICATOR 7.5 (GLOVE) ×1
GOWN STRL REUS W/TWL LRG LVL3 (GOWN DISPOSABLE) ×1 IMPLANT
GOWN STRL REUS W/TWL XL LVL3 (GOWN DISPOSABLE) ×2 IMPLANT
GUIDEWIRE AMPLAZ .035X145 (WIRE) ×4 IMPLANT
GUIDEWIRE ANG ZIPWIRE 038X150 (WIRE) ×2 IMPLANT
GUIDEWIRE STR DUAL SENSOR (WIRE) ×1 IMPLANT
HOLDER FOLEY CATH W/STRAP (MISCELLANEOUS) ×1 IMPLANT
KIT BASIN OR (CUSTOM PROCEDURE TRAY) ×2 IMPLANT
KIT PROBE 340X3.4XDISP GRN (MISCELLANEOUS) IMPLANT
KIT PROBE TRILOGY 3.4X340 (MISCELLANEOUS)
KIT PROBE TRILOGY 3.9X350 (MISCELLANEOUS) IMPLANT
KIT TURNOVER KIT A (KITS) IMPLANT
MANIFOLD NEPTUNE II (INSTRUMENTS) ×2 IMPLANT
NS IRRIG 1000ML POUR BTL (IV SOLUTION) ×1 IMPLANT
PACK CYSTO (CUSTOM PROCEDURE TRAY) ×2 IMPLANT
SET IRRIG Y TYPE TUR BLADDER L (SET/KITS/TRAYS/PACK) IMPLANT
SHEATH PEELAWAY SET 9 (SHEATH) ×2 IMPLANT
SPONGE LAP 4X18 RFD (DISPOSABLE) ×2 IMPLANT
STONE CATCHER W/TUBE ADAPTER (UROLOGICAL SUPPLIES) IMPLANT
SUT SILK 2 0 30  PSL (SUTURE) ×1
SUT SILK 2 0 30 PSL (SUTURE) ×1 IMPLANT
SYR 10ML LL (SYRINGE) ×2 IMPLANT
SYR 20ML LL LF (SYRINGE) ×4 IMPLANT
TRAY FOLEY MTR SLVR 14FR STAT (SET/KITS/TRAYS/PACK) IMPLANT
TRAY FOLEY MTR SLVR 16FR STAT (SET/KITS/TRAYS/PACK) ×2 IMPLANT
TUBING CONNECTING 10 (TUBING) ×3 IMPLANT
TUBING UROLOGY SET (TUBING) ×2 IMPLANT
WATER STERILE IRR 1000ML POUR (IV SOLUTION) ×1 IMPLANT

## 2018-10-19 NOTE — Procedures (Signed)
Pre Procedure Dx: Left sided nephrolithiasis Post Procedural Dx: Same  Successful fluoroscopic guided placement of a left sided nephroureteral catheter with tip within the urinary bladder.  EBL: Minimal Complications: None immediate.  Ronny Bacon, MD Pager #: [phone]

## 2018-10-19 NOTE — Procedures (Signed)
Pre Procedure Dx: Nephrolithiasis  Post Procedural Dx: Same  Successful fluoroscopic guided placement of a left sided PCN with end coiled and locked within the superior aspect of the left ureter. PCN connected to gravity bag.  EBL: Minimal Complications: None immediate.  Ronny Bacon, MD Pager #: [phone]

## 2018-10-19 NOTE — Anesthesia Procedure Notes (Signed)
Procedure Name: Intubation Date/Time: 10/19/2018 11:33 AM Performed by: Lavina Hamman, CRNA Pre-anesthesia Checklist: Patient identified, Emergency Drugs available, Suction available and Patient being monitored Patient Re-evaluated:Patient Re-evaluated prior to induction Oxygen Delivery Method: Circle System Utilized Preoxygenation: Pre-oxygenation with 100% oxygen Induction Type: IV induction, Cricoid Pressure applied and Rapid sequence Laryngoscope Size: Glidescope and 3 Grade View: Grade I Tube type: Oral Tube size: 7.5 mm Number of attempts: 1 Airway Equipment and Method: Stylet and Video-laryngoscopy Placement Confirmation: ETT inserted through vocal cords under direct vision,  positive ETCO2 and breath sounds checked- equal and bilateral Secured at: 23 cm Tube secured with: Tape Dental Injury: Teeth and Oropharynx as per pre-operative assessment  Difficulty Due To: Difficulty was anticipated, Difficult Airway- due to limited oral opening, Difficult Airway- due to dentition and Difficult Airway- due to large tongue Future Recommendations: Recommend- induction with short-acting agent, and alternative techniques readily available

## 2018-10-19 NOTE — Interval H&P Note (Signed)
History and Physical Interval Note:  10/19/2018 10:59 AM  Donald [name]  has presented today for surgery, with the diagnosis of LEFT RENAL CALCULUS.  The various methods of treatment have been discussed with the patient and family. After consideration of risks, benefits and other options for treatment, the patient has consented to  Procedure(s) with comments: NEPHROLITHOTOMY PERCUTANEOUS (Left) - 3 HRS HOLMIUM LASER APPLICATION (Left) as a surgical intervention.  The patient's history has been reviewed, patient examined, no change in status, stable for surgery.  I have reviewed the patient's chart and labs.  Questions were answered to the patient's satisfaction.     Lillette Boxer Joban Colledge

## 2018-10-19 NOTE — H&P (Signed)
H&P  Chief Complaint: Kidney stone  History of Present Illness:  73 year old male with history of  Urolithiasis presents at this time for percutaneous nephrolithotomy of a 32 mm left lower pole/ renal pelvic stone.  Percutaneous access was provided earlier today by Dr. Pascal Lux.   Past Surgical History:  Procedure Laterality Date  . APPENDECTOMY  1968  . [place], 06/14/2008   Dr. Trenton Gammon at [facility] (06/10)  . CARDIAC CATHETERIZATION    . CARPAL TUNNEL RELEASE Right 10/2017  . CATARACT EXTRACTION W/PHACO Left 05/30/2014   Procedure: CATARACT EXTRACTION PHACO AND INTRAOCULAR LENS PLACEMENT (IOC);  Surgeon: Estill Cotta, MD;  Location: [facility] ORS;  Service: Ophthalmology;  Laterality: Left;  [name] 01:20 AP% 23.7 CDE 31.86  . COLONOSCOPY  10/2002   HP, SSA, TA, rpt 3 yrs ([facility])  . COLONOSCOPY  01/2005   diverticulosis rpt 5 yrs ([facility])   . LITHOTRIPSY     X 2  . PARATHYROIDECTOMY Right 04/18/2016   PARATHYROIDECTOMY for cyst [name], MD)  . SHOULDER ARTHROSCOPY W/ ROTATOR CUFF REPAIR Right   . TONSILLECTOMY      Home Medications:    Allergies:  Allergies  Allergen Reactions  . Codeine Other (See Comments)    REACTION: chest pain  . Pioglitazone Other (See Comments)    REACTION to Actos: swelling in ankles    Family History  Problem Relation Age of Onset  . Cancer Mother        Breast Cancer  . Cancer Sister        Breast Cancer  . Diabetes Father   . Heart disease Father   . Diabetes Paternal Grandfather     Social History:  reports that he has never smoked. He has never used smokeless tobacco. He reports that he does not drink alcohol or use drugs.  ROS: A complete review of systems was performed.  All systems are negative except for pertinent findings as noted.  Physical Exam:  Vital signs in last 24 hours: Temp:  [98.1 F (36.7 C)] 98.1 F (36.7 C) (10/05 0843) Pulse Rate:  [62-70] 70 (10/05 1025) Resp:  [17-28] 25 (10/05  1025) BP: (158-195)/(50-75) 190/75 (10/05 1025) SpO2:  [89 %-98 %] 89 % (10/05 1025) Constitutional:  Alert and oriented, No acute distress Cardiovascular: Regular rate  Respiratory: Normal respiratory effort GI: Abdomen is   Obese,soft, nontender, nondistended, no abdominal masses. No CVAT.  Genitourinary: Normal male phallus, testes are descended bilaterally and non-tender and without masses, scrotum is normal in appearance without lesions or masses, perineum is normal on inspection. Lymphatic: No lymphadenopathy Neurologic: Grossly intact, no focal deficits Psychiatric: Normal mood and affect  Laboratory Data:  Recent Labs    10/19/18 0825  WBC 8.2  HGB 13.7  HCT 42.8  PLT 186    Recent Labs    10/19/18 0825  NA 143  K 3.6  CL 103  GLUCOSE 149*  BUN 33*  CALCIUM 9.6  CREATININE 1.54*     Results for orders placed or performed during the hospital encounter of 10/19/18 (from the past 24 hour(s))  Basic metabolic panel     Status: Abnormal   Collection Time: 10/19/18  8:25 AM  Result Value Ref Range   Sodium 143 135 - 145 mmol/L   Potassium 3.6 3.5 - 5.1 mmol/L   Chloride 103 98 - 111 mmol/L   CO2 27 22 - 32 mmol/L   Glucose, Bld 149 (H) 70 - 99 mg/dL   BUN  33 (H) 8 - 23 mg/dL   Creatinine, Ser 1.54 (H) 0.61 - 1.24 mg/dL   Calcium 9.6 8.9 - 10.3 mg/dL   GFR calc non Af Amer 45 (L) >60 mL/min   GFR calc Af Amer 52 (L) >60 mL/min   Anion gap 13 5 - 15  CBC with Differential/Platelet     Status: None   Collection Time: 10/19/18  8:25 AM  Result Value Ref Range   WBC 8.2 4.0 - 10.5 K/uL   RBC 4.79 4.22 - 5.81 MIL/uL   Hemoglobin 13.7 13.0 - 17.0 g/dL   HCT 42.8 39.0 - 52.0 %   MCV 89.4 80.0 - 100.0 fL   MCH 28.6 26.0 - 34.0 pg   MCHC 32.0 30.0 - 36.0 g/dL   RDW 14.9 11.5 - 15.5 %   Platelets 186 150 - 400 K/uL   nRBC 0.0 0.0 - 0.2 %   Neutrophils Relative % 69 %   Neutro Abs 5.7 1.7 - 7.7 K/uL   Lymphocytes Relative 21 %   Lymphs Abs 1.7 0.7 - 4.0 K/uL    Monocytes Relative 6 %   Monocytes Absolute 0.5 0.1 - 1.0 K/uL   Eosinophils Relative 3 %   Eosinophils Absolute 0.3 0.0 - 0.5 K/uL   Basophils Relative 1 %   Basophils Absolute 0.0 0.0 - 0.1 K/uL   Immature Granulocytes 0 %   Abs Immature Granulocytes 0.03 0.00 - 0.07 K/uL  Protime-INR     Status: None   Collection Time: 10/19/18  8:25 AM  Result Value Ref Range   Prothrombin Time 13.4 11.4 - 15.2 seconds   INR 1.0 0.8 - 1.2  Glucose, capillary     Status: Abnormal   Collection Time: 10/19/18  8:40 AM  Result Value Ref Range   Glucose-Capillary 140 (H) 70 - 99 mg/dL   Recent Results (from the past 240 hour(s))  Novel Coronavirus, NAA (Hosp order, Send-out to Ref Lab; TAT 18-24 hrs     Status: None   Collection Time: 10/15/18  2:31 PM   Specimen: Nasopharyngeal Swab; Respiratory  Result Value Ref Range Status   SARS-CoV-2, NAA NOT DETECTED NOT DETECTED Final    Comment: (NOTE) This nucleic acid amplification test was developed and its performance characteristics determined by [employer]. Nucleic acid amplification tests include PCR and TMA. This test has not been FDA cleared or approved. This test has been authorized by FDA under an Emergency Use Authorization (EUA). This test is only authorized for the duration of time the declaration that circumstances exist justifying the authorization of the emergency use of in vitro diagnostic tests for detection of SARS-CoV-2 virus and/or diagnosis of COVID-19 infection under section 564(b)(1) of the Act, 21 U.S.C. 878MVE-7(M) (1), unless the authorization is terminated or revoked sooner. When diagnostic testing is negative, the possibility of a false negative result should be considered in the context of a patient's recent exposures and the presence of clinical signs and symptoms consistent with COVID-19. An individual without symptoms of COVID- 19 and who is not shedding SARS-CoV-2 vi rus would expect to have a negative  (not detected) result in this assay. Performed At: [facility] [address] [place], [place] [id number] Rush Farmer MD ZM:[id number]    [place]  Final    Comment: Performed at [facility], [place] [address] [place], [place] [address]    Renal Function: Recent Labs    10/15/18 1411 10/19/18 0825  CREATININE 1.78* 1.54*  Estimated Creatinine Clearance: 56.3 mL/min (A) (by C-G formula based on SCr of 1.54 mg/dL (H)).  Radiologic Imaging: No results found.  Impression/Assessment:   32 mm left lower pole/ renal  Pelvic calculus  Plan:   left percutaneous nephrolithotomy.  I discussed the procedures well as risks and complications with the patient.  These include but are not limited to infection, bleeding, need for transfusion, urinary leak, stent placement, leaving percutaneous tube in for a lengthy time, anesthetic complications, among others.  He understands these and desires to proceed.

## 2018-10-19 NOTE — Plan of Care (Signed)

## 2018-10-19 NOTE — Transfer of Care (Signed)
Immediate Anesthesia Transfer of Care Note  Patient: Donald [name]  Procedure(s) Performed: NEPHROLITHOTOMY PERCUTANEOUS (Left )  Patient Location: PACU  Anesthesia Type:General  Level of Consciousness: awake, alert  and oriented  Airway & Oxygen Therapy: Patient Spontanous Breathing and Patient connected to face mask oxygen  Post-op Assessment: Report given to RN, Post -op Vital signs reviewed and stable and Patient moving all extremities  Post vital signs: Reviewed and stable  Last Vitals:  Vitals Value Taken Time  BP 104/86 10/19/18 1440  Temp    Pulse 58 10/19/18 1444  Resp 16 10/19/18 1444  SpO2 100 % 10/19/18 1444  Vitals shown include unvalidated device data.  Last Pain:  Vitals:   10/19/18 1105  TempSrc:   PainSc: 2          Complications: No apparent anesthesia complications

## 2018-10-19 NOTE — H&P (Signed)
Chief Complaint: Patient was seen in consultation today for renal calculi  Referring Physician(s): [place]  Supervising Physician: Sandi Mariscal  Patient Status: [facility] - Out-pt  History of Present Illness: Donald [name] is a 73 y.o. male with history of DM2, HTN, DVT, PE 2008, GERD, heart failure, OSA w/CPAP, asthma, CKD III, CAD, who presented to Urology in July of this year with left flank pain.  Patient with history of kidney stones, now with symptomatic left renal calculus in need of percutaneous nephroureteral stent placement for stone retrieval planned with Dr. Diona Fanti 10/19/18.  Patient presents today in his usual state of health. He complains of chronic back pain as well as left flank pain which has been present intermittently since July. Denise hematuria. He has been NPO.  He understands he is to undergo 2 separate procedures today.  All questions answered.  He elects to proceed.  Past Medical History:  Diagnosis Date  . (HFpEF) heart failure with preserved ejection fraction ([place])    a. 09/2018 Echo: EF 60-65%. PASP 24mmHg. Mild-mod LAE. Mild MR/TR.  Marland Kitchen ANEMIA-IRON DEFICIENCY 07/26/2006  . Anxiety   . ASTHMA 07/26/2006  . Asthma   . Back pain, chronic   . Bell's palsy   . CKD (chronic kidney disease), stage III   . COLONIC POLYPS, HX OF 07/26/2006  . DEPRESSION 03/14/2009  . DIABETES MELLITUS, TYPE II 07/26/2006  . [place] DISEASE, LUMBAR 10/05/2007  . DVT 12/03/2007  . DYSLIPIDEMIA 04/13/2009  . Dyspnea    when gets up and walks around and back is hurting really bad -only Shortness of breath  then  . GERD 07/26/2006  . Gout   . Heart murmur   . History of kidney stones   . HYPERTENSION 07/26/2006  . INSOMNIA 08/21/2007  . Neuropathy   . Nonobstructive CAD (coronary artery disease)    a. 2012 Cath: no high grade stenosis; b. 2018 MV: No ischemia. Attenuation artifact.   . OBSTRUCTIVE SLEEP APNEA 12/03/2007   Use C-PAP  . PERIPHERAL NEUROPATHY 07/26/2006  .  Pernicious anemia 11/20/2006  . PULMONARY EMBOLISM 10/05/2007    Past Surgical History:  Procedure Laterality Date  . APPENDECTOMY  1968  . [place], 06/14/2008   Dr. Trenton Gammon at [facility] (06/10)  . CARDIAC CATHETERIZATION    . CARPAL TUNNEL RELEASE Right 10/2017  . CATARACT EXTRACTION W/PHACO Left 05/30/2014   Procedure: CATARACT EXTRACTION PHACO AND INTRAOCULAR LENS PLACEMENT (IOC);  Surgeon: Estill Cotta, MD;  Location: [facility] ORS;  Service: Ophthalmology;  Laterality: Left;  [name] 01:20 AP% 23.7 CDE 31.86  . COLONOSCOPY  10/2002   HP, SSA, TA, rpt 3 yrs ([place])  . COLONOSCOPY  01/2005   diverticulosis rpt 5 yrs ([place])   . LITHOTRIPSY     X 2  . PARATHYROIDECTOMY Right 04/18/2016   PARATHYROIDECTOMY for cyst [name], MD)  . SHOULDER ARTHROSCOPY W/ ROTATOR CUFF REPAIR Right   . TONSILLECTOMY      Allergies: Codeine and Pioglitazone  Medications: Prior to Admission medications   Medication Sig Start Date End Date Taking? Authorizing Provider  allopurinol (ZYLOPRIM) 300 MG tablet TAKE ONE TABLET BY MOUTH EVERY DAY Patient taking differently: Take 300 mg by mouth at bedtime.  10/12/18   Ria Bush, MD  ALPRAZolam Duanne Moron) 1 MG tablet TAKE ONE TABLET BY MOUTH AT BEDTIME AS NEEDED FOR SLEEP Patient taking differently: Take 0.5 mg by mouth at bedtime.  09/11/18   Ria Bush, MD  amLODipine (NORVASC) 5 MG  tablet Take 1 tablet (5 mg total) by mouth daily. 10/08/18 01/06/19  Theora Gianotti, NP  aspirin EC 81 MG tablet Take 81 mg by mouth at bedtime.    [provider]  colchicine (COLCRYS) 0.6 MG tablet TAKE 1 TABLET BY MOUTH EVERY HOUR UNTIL SYMPTOMS ARE BETTER, MAX OF 6TABS PER DAY. STOP IF DIARRHEA Patient taking differently: Take 0.6 mg by mouth daily as needed (gout flare ups.). MAX OF 6TABS PER DAY. STOP IF DIARRHEA 01/13/17   Renato Shin, MD  cyanocobalamin (,VITAMIN B-12,) 1000 MCG/ML injection Inject 1,000 mcg into the  muscle every 30 (thirty) days.    [provider]  fluticasone (FLONASE) 50 MCG/ACT nasal spray Place 2 sprays into both nostrils daily. Patient taking differently: Place 2 sprays into both nostrils daily as needed (allergies.).  04/28/18   Ria Bush, MD  furosemide (LASIX) 80 MG tablet Take 1 tablet (80 mg total) by mouth daily. 12/19/17   Ria Bush, MD  gabapentin (NEURONTIN) 600 MG tablet TAKE TWO TABLETS (1200MG ) BY MOUTH AT BEDTIME Patient taking differently: Take 1,200 mg by mouth at bedtime.  09/11/18   Ria Bush, MD  glucose blood (GE100 BLOOD GLUCOSE TEST) test strip 1 each by Other route 4 (four) times daily. E11.21 09/04/18   Renato Shin, MD  HYDROcodone-acetaminophen [facility]) 10-325 MG tablet Take 1 tablet by mouth every 6 (six) hours as needed for moderate pain ((typically once to twice daily if needed)).  12/19/17   Ria Bush, MD  insulin NPH Human (HUMULIN N) 100 UNIT/ML injection Inject 2.9 mLs (290 Units total) into the skin at bedtime. 06/04/18   Renato Shin, MD  insulin regular (NOVOLIN R RELION) 100 units/mL injection INJECT 210 UNITS INTO THE SKIN BEFORE BREAKFAST, INJECT 170 UNITS BEFORE LUNCH, AND INJECT 270 UNITS BEFORE DINNER. (JUST BEFORE EACH MEAL) Patient taking differently: Inject 200-270 Units into the skin See admin instructions. INJECT 210 UNITS INTO THE SKIN BEFORE BREAKFAST(OFTEN SKIPS BREAKFAST) , INJECT 200 UNITS BEFORE LUNCH, AND INJECT 270 UNITS BEFORE DINNER. (JUST BEFORE EACH MEAL) 08/04/18   Renato Shin, MD  losartan (COZAAR) 100 MG tablet TAKE ONE TABLET BY MOUTH AT BEDTIME Patient taking differently: Take 100 mg by mouth at bedtime.  06/18/18   Ria Bush, MD  lovastatin (MEVACOR) 40 MG tablet TAKE 2 TABLETS BY MOUTH (80MG  TOTAL) AT BEDTIME Patient taking differently: Take 80 mg by mouth at bedtime.  08/14/18   Tonia Ghent, MD  Naphazoline-Pheniramine (OPCON-A OP) Place 1 drop into both eyes 3 (three) times  daily as needed (itchy eyes.).     [provider]  ondansetron (ZOFRAN) 4 MG tablet Take 1 tablet (4 mg total) by mouth every 4 (four) hours as needed for nausea. 08/10/18   Lesleigh Noe, MD  Sennosides-Docusate Sodium (STOOL SOFTENER & LAXATIVE PO) Take 2 tablets by mouth at bedtime.    [provider]     Family History  Problem Relation Age of Onset  . Cancer Mother        Breast Cancer  . Cancer Sister        Breast Cancer  . Diabetes Father   . Heart disease Father   . Diabetes Paternal Grandfather     Social History   Socioeconomic History  . Marital status: Married    Spouse name: Not on file  . Number of children: Not on file  . Years of education: Not on file  . Highest education level: Not on file  Occupational History  . Occupation: Disabled    Employer: [employer]  Social Needs  . Financial resource strain: Not on file  . Food insecurity    Worry: Not on file    Inability: Not on file  . Transportation needs    Medical: Not on file    Non-medical: Not on file  Tobacco Use  . Smoking status: Never Smoker  . Smokeless tobacco: Never Used  Substance and Sexual Activity  . Alcohol use: No  . Drug use: No  . Sexual activity: Not on file  Lifestyle  . Physical activity    Days per week: Not on file    Minutes per session: Not on file  . Stress: Not on file  Relationships  . Social Herbalist on phone: Not on file    Gets together: Not on file    Attends religious service: Not on file    Active member of club or organization: Not on file    Attends meetings of clubs or organizations: Not on file    Relationship status: Not on file  Other Topics Concern  . 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   Activity is severely limited by medical problems   Diet is "good".   Never a smoker   Alcohol: none           Review of Systems: A 12 point ROS  discussed and pertinent positives are indicated in the HPI above.  All other systems are negative.  Review of Systems  Constitutional: Negative for fatigue and fever.  Respiratory: Negative for cough and shortness of breath.   Gastrointestinal: Negative for abdominal pain, nausea and vomiting.  Genitourinary: Positive for flank pain. Negative for dysuria.  Musculoskeletal: Negative for back pain.  Psychiatric/Behavioral: Negative for behavioral problems and confusion.    Vital Signs: There were no vitals taken for this visit.  Physical Exam Vitals signs and nursing note reviewed.  HENT:     Mouth/Throat:     Mouth: Mucous membranes are moist.     Pharynx: Oropharynx is clear.  Cardiovascular:     Rate and Rhythm: Normal rate and regular rhythm.  Pulmonary:     Effort: Pulmonary effort is normal. No respiratory distress.     Breath sounds: Normal breath sounds.  Skin:    General: Skin is warm and dry.  Neurological:     General: No focal deficit present.     Mental Status: He is alert and oriented to person, place, and time.  Psychiatric:        Mood and Affect: Mood normal.        Behavior: Behavior normal.        Thought Content: Thought content normal.        Judgment: Judgment normal.      MD Evaluation Airway: WNL Heart: WNL Abdomen: WNL Chest/ Lungs: WNL ASA  Classification: 3 Mallampati/Airway Score: Three   Imaging: No results found.  Labs:  CBC: Recent Labs    03/18/18 0920 08/10/18 1134 10/15/18 1411 10/19/18 0825  WBC 7.6 7.6 7.2 8.2  HGB 13.4 13.4 13.4 13.7  HCT 39.4 40.6 43.0 42.8  PLT 196.0 184.0 175 186    COAGS: Recent Labs    10/19/18 0825  INR 1.0    BMP: Recent Labs    03/18/18 0920 08/10/18 1134 10/01/18 1559 10/15/18 1411  NA 143 142 145*  140  K 4.3 5.0 4.7 4.4  CL 101 100 103 101  CO2 31 34* 26 28  GLUCOSE 223* 167* 133* 215*  BUN 30* 49* 37* 45*  CALCIUM 9.1 9.8 9.6 9.2  CREATININE 1.65* 1.81* 1.57* 1.78*   GFRNONAA  --   --  44* 38*  GFRAA  --   --  51* 44*    LIVER FUNCTION TESTS: Recent Labs    10/21/17 1654 03/18/18 0920 08/10/18 1134  BILITOT 1.0 0.9 1.0  AST 17 12 11   ALT 18 18 13   ALKPHOS 87 107 87  PROT 6.7 6.2 6.3  ALBUMIN 4.2 3.9 4.1    TUMOR MARKERS: No results for input(s): AFPTM, CEA, CA199, CHROMGRNA in the last 8760 hours.  Assessment and Plan: Patient with extensive medical history presents with complaint of large left renal calculi.  IR consulted for pre-operative percutaneous nephroureteral stent vs. Nephrostomy tube at the request of Dr. Diona Fanti. Case reviewed by Dr. Pascal Lux who approves patient for procedure.  Patient presents today in their usual state of health.  He has been NPO and is not currently on blood thinners.   Risks and benefits of left PCN placement was discussed with the patient including, but not limited to, infection, bleeding, significant bleeding causing loss or decrease in renal function or damage to adjacent structures.   All of the patient's questions were answered, patient is agreeable to proceed.  Consent signed and in chart.  Thank you for this interesting consult.  I greatly enjoyed meeting Rawlin Reaume Felmlee and look forward to participating in their care.  A copy of this report was sent to the requesting provider on this date.  Electronically Signed: Docia Barrier, PA 10/19/2018, 9:13 AM   I spent a total of  30 Minutes   in face to face in clinical consultation, greater than 50% of which was counseling/coordinating care for renal calculi.

## 2018-10-20 ENCOUNTER — Observation Stay (HOSPITAL_COMMUNITY): Payer: Medicare Other

## 2018-10-20 ENCOUNTER — Encounter (HOSPITAL_COMMUNITY): Payer: Self-pay | Admitting: Radiology

## 2018-10-20 DIAGNOSIS — K219 Gastro-esophageal reflux disease without esophagitis: Secondary | ICD-10-CM | POA: Diagnosis present

## 2018-10-20 DIAGNOSIS — J45909 Unspecified asthma, uncomplicated: Secondary | ICD-10-CM | POA: Diagnosis not present

## 2018-10-20 DIAGNOSIS — G8929 Other chronic pain: Secondary | ICD-10-CM | POA: Diagnosis present

## 2018-10-20 DIAGNOSIS — Z8719 Personal history of other diseases of the digestive system: Secondary | ICD-10-CM | POA: Diagnosis not present

## 2018-10-20 DIAGNOSIS — I2510 Atherosclerotic heart disease of native coronary artery without angina pectoris: Secondary | ICD-10-CM | POA: Diagnosis not present

## 2018-10-20 DIAGNOSIS — N200 Calculus of kidney: Secondary | ICD-10-CM | POA: Diagnosis not present

## 2018-10-20 DIAGNOSIS — Z7982 Long term (current) use of aspirin: Secondary | ICD-10-CM | POA: Diagnosis not present

## 2018-10-20 DIAGNOSIS — Z885 Allergy status to narcotic agent status: Secondary | ICD-10-CM | POA: Diagnosis not present

## 2018-10-20 DIAGNOSIS — E785 Hyperlipidemia, unspecified: Secondary | ICD-10-CM | POA: Diagnosis present

## 2018-10-20 DIAGNOSIS — Z79891 Long term (current) use of opiate analgesic: Secondary | ICD-10-CM | POA: Diagnosis not present

## 2018-10-20 DIAGNOSIS — Z794 Long term (current) use of insulin: Secondary | ICD-10-CM | POA: Diagnosis not present

## 2018-10-20 DIAGNOSIS — Z79899 Other long term (current) drug therapy: Secondary | ICD-10-CM | POA: Diagnosis not present

## 2018-10-20 DIAGNOSIS — Z888 Allergy status to other drugs, medicaments and biological substances status: Secondary | ICD-10-CM | POA: Diagnosis not present

## 2018-10-20 DIAGNOSIS — D62 Acute posthemorrhagic anemia: Secondary | ICD-10-CM | POA: Diagnosis not present

## 2018-10-20 DIAGNOSIS — E1122 Type 2 diabetes mellitus with diabetic chronic kidney disease: Secondary | ICD-10-CM | POA: Diagnosis not present

## 2018-10-20 DIAGNOSIS — G4733 Obstructive sleep apnea (adult) (pediatric): Secondary | ICD-10-CM | POA: Diagnosis not present

## 2018-10-20 DIAGNOSIS — I5032 Chronic diastolic (congestive) heart failure: Secondary | ICD-10-CM | POA: Diagnosis not present

## 2018-10-20 DIAGNOSIS — I130 Hypertensive heart and chronic kidney disease with heart failure and stage 1 through stage 4 chronic kidney disease, or unspecified chronic kidney disease: Secondary | ICD-10-CM | POA: Diagnosis not present

## 2018-10-20 DIAGNOSIS — N183 Chronic kidney disease, stage 3 unspecified: Secondary | ICD-10-CM | POA: Diagnosis not present

## 2018-10-20 DIAGNOSIS — Z539 Procedure and treatment not carried out, unspecified reason: Secondary | ICD-10-CM | POA: Diagnosis not present

## 2018-10-20 DIAGNOSIS — Z6841 Body Mass Index (BMI) 40.0 and over, adult: Secondary | ICD-10-CM | POA: Diagnosis not present

## 2018-10-20 DIAGNOSIS — Z87442 Personal history of urinary calculi: Secondary | ICD-10-CM | POA: Diagnosis not present

## 2018-10-20 DIAGNOSIS — Z86711 Personal history of pulmonary embolism: Secondary | ICD-10-CM | POA: Diagnosis not present

## 2018-10-20 LAB — HEMOGLOBIN AND HEMATOCRIT, BLOOD
HCT: 31.8 % — ABNORMAL LOW (ref 39.0–52.0)
Hemoglobin: 9.5 g/dL — ABNORMAL LOW (ref 13.0–17.0)

## 2018-10-20 LAB — SURGICAL PATHOLOGY

## 2018-10-20 LAB — GLUCOSE, CAPILLARY
Glucose-Capillary: 259 mg/dL — ABNORMAL HIGH (ref 70–99)
Glucose-Capillary: 330 mg/dL — ABNORMAL HIGH (ref 70–99)
Glucose-Capillary: 285 mg/dL — ABNORMAL HIGH (ref 70–99)
Glucose-Capillary: 323 mg/dL — ABNORMAL HIGH (ref 70–99)

## 2018-10-20 LAB — HIV ANTIBODY (ROUTINE TESTING W REFLEX): HIV Screen 4th Generation wRfx: NONREACTIVE

## 2018-10-20 MED ORDER — CHLORHEXIDINE GLUCONATE CLOTH 2 % EX PADS: 6.0000 | MEDICATED_PAD | Freq: Every day | CUTANEOUS | Status: DC

## 2018-10-20 NOTE — Op Note (Signed)
Preop diagnosis: Large left lower pole stone  Postoperative diagnosis: Same  Principal procedure: Through percutaneous access established in interventional radiology, percutaneous nephroscopy, attempted percutaneous nephrolithotomy, stone greater than 3 cm in size  Anesthesia: General endotracheal  Specimen: Biopsy of renal pelvic filling defect  Drains: 10 French nephrostomy tube through upper pole of left kidney  Estimated blood loss: 409 mL  Complications: Inability to identify any stone material, lost percutaneous access, brisk bleeding  Indications: 73 year old male with large left lower pole calculus.  This is greater than 3 cm in size, and on CT scan from the summer 2020 this did involve his renal pelvis.  There is no significant hydronephrosis, but the lower pole calyces are quite dilated.  I have discussed percutaneous management of this process with the patient.  I have also discussed intraoperative and postoperative course as well as risks and complications including but not limited to bleeding, need for transfusion, infection, anesthetic complications, possible need for second procedure.  The patient understands and desires to proceed.  Findings: Upon entry through the access sheath, I was unable to visualize any stone material, either with the flexible or the rigid nephroscope.  With fluoroscopy, the scope seem to be adjacent to the large stone which was fairly radiolucent.  There was soft material within this large calyx which was biopsied.  Description of procedure: After proper identification and marking in the holding area, the patient was brought to the operating room where general endotracheal anesthetic was administered.  His bladder was drained with Foley catheter.  There was significant hematuria at this point.  Following this, he was transferred to the prone position.  All extremities and pressure points were carefully padded.  His left flank was prepped and draped around  the existing nephrostomy tube, placed by Dr. Pascal Lux.  Proper timeout was performed.  A Super Stiff, floppy tip guidewire was advanced through the Kumpe catheter into the bladder using fluoroscopic guidance.  The Kumpe catheter was then removed, and a 10 French peel-away access catheter was advanced over the guidewire, easily into the upper ureter.  The core was removed and a second, safety wire was placed along the first guidewire into the bladder.  At this point an incision was made just lateral to the guidewire and the subcutaneous tissue was dissected with a hemostat.  I then placed the NephroMax balloon over top of the working guidewire into the renal pelvis, with the radiopaque tip abutting the stone material.  The balloon was inflated to 20 atm of pressure for approximately 3 minutes, then the access sheath was placed over top of the inflated balloon.  The balloon was deflated and removed over top of the guidewire.  There was a moderate amount of blood coming through the sheath at this point.  The rigid nephroscope was passed into the accessed lower pole calyx, but I could not get any landmarks easily visible.  I thought that perhaps the access catheter was not advanced far enough.  This was then advanced farther over the balloon which was again inflated properly.  Once the balloon was removed, the scope was replaced.  I could follow the 2 guidewires out of what I considered the infundibulum or out of the ureteropelvic junction.  However, with significant inspection of the lower pole access, I could not identify stone material.  There was some material within the dilated calyx which I did biopsy and sent for permanent specimen.  I placed a flexible cystoscope at this point.  Despite fluoroscopic assistance  with maneuvering the scope around, I still could not find any stone material.  I spent approximately 1 hour and 15 minutes performing this task.  I could still see no stone.  There was a fair amount of  bleeding during the procedure.  At this point, I felt it worthwhile to back out for a second procedure to be done at a later date.  I then advanced a Ord pigtail catheter over top of the working guidewire.  Once positioned within the area of the renal pelvis, verified by injecting contrast, the catheter was curled properly.  The guidewire had been removed.  I then removed the access sheath over top of the [facility] catheter.  After this, there was a brisk amount of bleeding through the catheter, for approximately 5 minutes even with compressing the flank around the catheter exit site.  At this point, I felt as if I needed to replace the balloon temporarily to compress the invaded system.  I then placed the guidewire through the [facility] catheter, and remove the catheter over top of the guidewire.  I placed a Kumpe catheter over the guidewire and remove the guidewire for injection of contrast.  There seemed to be a significant amount of extravasation at this point, and I feel like I lost the access to the lower pole calyces.  At this point, I called Dr. Pascal Lux for him to replace a nephrostomy tube, which he did through a separate upper pole stick.  This will be dictated separately.  During this procedure, there was not significant bleeding through the nephrostomy access previously used.  I sutured the skin closed with a 2-0 silk placed in a horizontal mattress fashion.  Dry sterile dressing was placed both over this and the newly placed nephrostomy tube.  At this point, the procedure was terminated.  The patient was awakened.  He was transferred to the supine position prior to extubation.  He tolerated the procedure well.  I estimated blood loss at approximately 600 mL.  He was transferred to the PACU in stable condition.

## 2018-10-20 NOTE — Progress Notes (Signed)
1 Day Post-Op  Subjective: The patient is doing well but has some pain and nausea.  Hgb is down to 9.5 from 12.5.  His urine is dark blood without clots and a CT this AM shows only a small hematoma and the tube in good position.    ROS:  Review of Systems  All other systems reviewed and are negative.   Anti-infectives: Anti-infectives (From admission, onward)   Start     Dose/Rate Route Frequency Ordered Stop   10/19/18 2200  cephALEXin (KEFLEX) capsule 500 mg     500 mg Oral Every 12 hours 10/19/18 1642     10/19/18 0830  ceFAZolin (ANCEF) IVPB 2g/100 mL premix  Status:  Discontinued     2 g 200 mL/hr over 30 Minutes Intravenous To Radiology 10/19/18 0824 10/19/18 1627   10/19/18 0600  ceFAZolin (ANCEF) 3 g in dextrose 5 % 50 mL IVPB  Status:  Discontinued     3 g 100 mL/hr over 30 Minutes Intravenous On call to O.R. 10/18/18 0818 10/19/18 1627      Current Facility-Administered Medications  Medication Dose Route Frequency Provider Last Rate Last Dose  . 0.45 % sodium chloride infusion   Intravenous Continuous Franchot Gallo, MD 75 mL/hr at 10/19/18 2040    . allopurinol (ZYLOPRIM) tablet 300 mg  300 mg Oral QHS Franchot Gallo, MD   300 mg at 10/19/18 2041  . ALPRAZolam Duanne Moron) tablet 0.5 mg  0.5 mg Oral QHS Franchot Gallo, MD   0.5 mg at 10/19/18 2040  . amLODipine (NORVASC) tablet 5 mg  5 mg Oral Daily Franchot Gallo, MD      . aspirin EC tablet 81 mg  81 mg Oral QHS Franchot Gallo, MD   81 mg at 10/19/18 2041  . cephALEXin (KEFLEX) capsule 500 mg  500 mg Oral Q12H Franchot Gallo, MD   500 mg at 10/19/18 2041  . Chlorhexidine Gluconate Cloth 2 % PADS 6 each  6 each Topical Daily Dahlstedt, Stephen, MD      . furosemide (LASIX) tablet 80 mg  80 mg Oral Daily Dahlstedt, Annie Main, MD      . gabapentin (NEURONTIN) capsule 1,200 mg  1,200 mg Oral QHS Franchot Gallo, MD   1,200 mg at 10/19/18 2039  . HYDROmorphone (DILAUDID) injection 0.5-1 mg  0.5-1 mg  Intravenous Q2H PRN Franchot Gallo, MD   1 mg at 10/20/18 0210  . insulin aspart (novoLOG) injection 0-20 Units  0-20 Units Subcutaneous TID WC Franchot Gallo, MD   11 Units at 10/19/18 1753  . losartan (COZAAR) tablet 100 mg  100 mg Oral QHS Franchot Gallo, MD   100 mg at 10/19/18 2040  . naphazoline-pheniramine (NAPHCON-A) 0.025-0.3 % ophthalmic solution 1 drop  1 drop Both Eyes TID PRN Franchot Gallo, MD      . ondansetron [facility]) tablet 4 mg  4 mg Oral Q4H PRN Franchot Gallo, MD      . oxyCODONE (Oxy IR/ROXICODONE) immediate release tablet 5 mg  5 mg Oral Q4H PRN Franchot Gallo, MD   5 mg at 10/19/18 1736  . pravastatin (PRAVACHOL) tablet 10 mg  10 mg Oral q1800 Franchot Gallo, MD   10 mg at 10/19/18 1752  . senna-docusate (Senokot-S) tablet 1 tablet  1 tablet Oral QHS Franchot Gallo, MD   1 tablet at 10/19/18 2040  . sodium chloride flush (NS) 0.9 % injection 5 mL  5 mL Intracatheter Q8H Sandi Mariscal, MD   5 mL at 10/19/18 2041  Objective: Vital signs in last 24 hours: Temp:  [97.7 F (36.5 C)-98.2 F (36.8 C)] 98 F (36.7 C) (10/06 0556) Pulse Rate:  [53-70] 53 (10/06 0556) Resp:  [12-28] 18 (10/06 0556) BP: (102-195)/(31-86) 102/42 (10/06 0556) SpO2:  [89 %-100 %] 96 % (10/06 0556) Weight:  [098 kg] 127 kg (10/05 1636)  Intake/Output from previous day: 10/05 0701 - 10/06 0700 In: 3300 [I.V.:3300] Out: 1420 [Urine:820; Blood:600] Intake/Output this shift: No intake/output data recorded.   Physical Exam Vitals signs reviewed.  Constitutional:      Appearance: Normal appearance. He is obese.  Cardiovascular:     Rate and Rhythm: Normal rate and regular rhythm.  Pulmonary:     Effort: Pulmonary effort is normal. No respiratory distress.     Breath sounds: Normal breath sounds.  Abdominal:     General: Bowel sounds are normal.     Palpations: Abdomen is soft.     Tenderness: There is abdominal tenderness (LUQ.    ).     Comments: NT  draining well with dark slightly bloody urine.   Musculoskeletal: Normal range of motion.        General: No swelling or tenderness.  Skin:    General: Skin is warm and dry.  Neurological:     General: No focal deficit present.     Mental Status: He is alert and oriented to person, place, and time.  Psychiatric:        Mood and Affect: Mood normal.        Behavior: Behavior normal.     Lab Results:  Recent Labs    10/19/18 0825 10/19/18 1508 10/20/18 0526  WBC 8.2  --   --   HGB 13.7 12.5* 9.5*  HCT 42.8 39.5 31.8*  PLT 186  --   --    BMET Recent Labs    10/19/18 0825  NA 143  K 3.6  CL 103  CO2 27  GLUCOSE 149*  BUN 33*  CREATININE 1.54*  CALCIUM 9.6   PT/INR Recent Labs    10/19/18 0825  LABPROT 13.4  INR 1.0   ABG No results for input(s): PHART, HCO3 in the last 72 hours.  Invalid input(s): PCO2, PO2  Studies/Results: Dg C-arm 1-60 Min-no Report  Result Date: 10/19/2018 Fluoroscopy was utilized by the requesting physician.  No radiographic interpretation.   Ct Renal Stone Study  Result Date: 10/20/2018 CLINICAL DATA:  Flank pain.  Evaluate for stone disease. EXAM: CT ABDOMEN AND PELVIS WITHOUT CONTRAST TECHNIQUE: Multidetector CT imaging of the abdomen and pelvis was performed following the standard protocol without IV contrast. COMPARISON:  08/07/2018 FINDINGS: Lower chest: Subsegmental, subpleural atelectasis is noted along the posterior left lower lobe. Hepatobiliary: No suspicious liver abnormality identified. Multiple stones are identified within the gallbladder measuring up to 1.2 cm. No gallbladder wall thickening or inflammation. No biliary ductal dilatation identified. Pancreas: Unremarkable. No pancreatic ductal dilatation or surrounding inflammatory changes. Spleen: Normal in size without focal abnormality. Adrenals/Urinary Tract: Normal adrenal glands. Small stone is again noted within the inferior pole of the right kidney measuring 2 mm.  Interval placement of left sided percutaneous nephrostomy tube. Tip of the catheter is at the level of the left UPJ. Gas noted within the left upper collecting system. A large stone within the inferior pole of the left kidney is again noted measuring 2.7 by 2.0 cm, image 99/4. High attenuation soft tissue stranding posterior and lateral to the inferior pole of left kidney may represent extravasated  contrast and/or postprocedure hemorrhage. Foley catheter in place. Contrast material noted within partially collapsed urinary bladder. Soft tissue stranding around the bladder is noted. Stomach/Bowel: Stomach is within normal limits. No evidence of bowel wall thickening, distention, or inflammatory changes. Vascular/Lymphatic: Aortic atherosclerosis. No aneurysm. No abdominopelvic adenopathy. Reproductive: Mild prostate gland enlargement. Other: Small fat containing left inguinal hernia. Musculoskeletal: Thoracolumbar spondylosis identified. Status post interbody fusion and posterior hardware fixation at L4-5 with laminectomy. Multi level degenerative disc disease identified within the remaining lumbar disc spaces. IMPRESSION: 1. Large left lower pole renal calculus is again noted measuring 2.7 x 2.0 cm. 2. Interval placement of left posterior percutaneous nephrostomy tube. The tip of the tube terminates at the left UPJ. 3. High attenuation soft tissue stranding posterior and lateral to the inferior pole of left kidney may represent a postprocedure hemorrhage versus extravasated contrast material. 4. Punctate right lower pole renal calculus noted. 5. Gallstones 6.  Aortic Atherosclerosis (ICD10-I70.0). Electronically Signed   By: Kerby Moors M.D.   On: 10/20/2018 08:40   Ir Nephrostomy Placement Left  Result Date: 10/19/2018 INDICATION: History of left-sided nephrolithiasis, post fluoroscopic guided placement of a access for nephrolithotomy earlier same day Unfortunately, the nephrolithotomy procedure was very  difficult and ultimately percutaneous access was lost. As such, request made for placement of a new nephrostomy catheter for urinary diversion purposes. EXAM: FLUOROSCOPIC GUIDED LEFT PERCUTANEOUS NEPHROSTOMY TUBE PLACEMENT. COMPARISON:  Fluoroscopic guided left-sided percutaneous nephrolithotomy access-earlier same day; CT abdomen pelvis-08/07/2018 MEDICATIONS: The antibiotic was administered in an appropriate time frame prior to skin puncture. ANESTHESIA/SEDATION: General anesthesia as provided by the anesthesia service. CONTRAST:  Not provided FLUOROSCOPY TIME:  Not provided COMPLICATIONS: None immediate. PROCEDURE: The patient had been previously prepped and draped in usual sterile fashion by the operating room staff. Initial attempts were made to recannulate the collecting system via the initial percutaneous access however this ultimately proved unsuccessful. As such, an contrast opacified mid posterior pole calyx was targeted fluoroscopically with a [place] needle. Access to the collecting system was confirmed with limited contrast injection and advancement of a Nitrex wire through the left renal pelvis to the level of the superior aspect the left ureter. Under fluoroscopic guidance, the track was dilated with an Coal set. Contrast injection confirmed appropriate positioning. Next, an Amplatz wire was advanced to the level the superior aspect of the right ureter. The track was dilated ultimately long placement of a 10 French percutaneous nephrostomy catheter with end terminating within the superior aspect of left ureter. Contrast injection confirmed appropriate position functionality of nephrostomy catheter The catheter was secured at the skin with a Prolene retention suture and a gravity bag was placed. A dressing was placed. The patient tolerated procedure well without immediate postprocedural complication. FINDINGS: Initial contrast injection of the existing Kumpe catheter demonstrated  extravasation of contrast about right kidney. Despite prolonged efforts, the right renal collecting system could not be recannulated from the initial percutaneous access. After fluoroscopically targeting posterior midpole calyx, 10 French percutaneous nephrostomy catheter was placed for the purposes of urinary diversion with end terminating within the superior aspect the left ureter. Note, the nephrostomy could not be coiled at the level of the left renal pelvis secondary to the large mid/inferior pole stone as well as blood products within the left renal pelvis. IMPRESSION: Technically successful fluoroscopic guided placement of a new mid posterior caliceal approach 10 French nephrostomy catheter with tip terminating within the superior aspect of the left ureter. PLAN: - patient's nephrostomy catheter  well maintained to external drainage. - The ultimate plan for repeat nephrolithotomy will be performed at the discretion of Dr. Diona Fanti, however potentially this nephrostomy access could be utilized during future percutaneous interventions. Electronically Signed   By: Sandi Mariscal M.D.   On: 10/19/2018 16:25   Ir Ureteral Stent Left New Access W/o Sep Nephrostomy Cath  Result Date: 10/19/2018 INDICATION: Renal stones, access for left-sided percutaneous nephrolithotomy. EXAM: 1. FLUOROSCOPIC GUIDANCE FOR PUNCTURE OF THE LEFT SIDED RENAL COLLECTING SYSTEM. 2. FLUOROSCOPIC GUIDED PLACEMENT OF A LEFT SIDED NEPHROURETERAL CATHETER. COMPARISON:  CT of the abdomen and pelvis-08/07/2018 MEDICATIONS: Ancef 2 gm IV; The antibiotic was administered in an appropriate time frame prior to skin puncture. ANESTHESIA/SEDATION: Moderate (conscious) sedation was employed during this procedure. A total of Versed 2 mg and Fentanyl 150 mcg was administered intravenously. Moderate Sedation Time: 25 minutes. The patient's level of consciousness and vital signs were monitored continuously by radiology nursing throughout the procedure  under my direct supervision. CONTRAST:  15 cc Isovue-300 - administered into the left renal collecting system. FLUOROSCOPY TIME:  7 minutes, 48 seconds (111 mGy) COMPLICATIONS: None immediate. PROCEDURE: Informed written consent was obtained from the patient after a discussion of the risks, benefits, and alternatives to treatment. The left flank region was prepped with Betadine in a sterile fashion, and a sterile drape was applied covering the operative field. A sterile gown and sterile gloves were used for the procedure. A timeout was performed prior to the initiation of the procedure. A pre procedural spot fluoroscopic image was obtained of the upper abdomen. The stone within the inferior aspect of the left renal pelvis extending to involve several inferior pole calices was targeted fluoroscopically with a [place] needle. Access to the collecting system was confirmed with contrast injection. Next, a Nitrex wire was advanced beyond the stone to the level the superior aspect ureter. An Accustick set was utilized to dilate the tract and was subsequently exchanged for a Kumpe catheter over a stiff Glidewire. The Kumpe catheter was advanced down the ureter and into the urinary bladder. Postprocedural spot radiographs were obtained in various obliquities and the catheter was sutured to the skin. The catheter was capped and a dressing was placed. The patient tolerated the procedure well without immediate post procedural complication. FINDINGS: Pre procedural spot radiographic images demonstrates an ill-defined approximately 2.8 x 2.0 cm stone overlying expected location of the inferior pole the left kidney. A posteroinferior lateral aspect of the ill-defined stone was targeted fluoroscopically. Appropriate access to the collecting system was confirmed with limited contrast injection. Ultimately, a Kumpe catheter was advanced beyond the pelvic/calyceal stone with tip terminating within the urinary bladder.  IMPRESSION: Successful fluoroscopic guided placement of a left sided 5 [place] Kumpe catheter to the level of the urinary bladder to be utilized during impending nephrolithotomy procedure. Electronically Signed   By: Sandi Mariscal M.D.   On: 10/19/2018 11:18     Assessment and Plan: S/P left PCNL that was aborted secondary to bleeding and poor visualization.    He has Acute Blood Loss anemia but is hemodynamically stable with no evidence of active bleeding.    D/C foley.  Repeat H&H in AM.       LOS: 0 days    Irine Seal 10/20/2018 [phone]

## 2018-10-20 NOTE — Anesthesia Postprocedure Evaluation (Signed)
Anesthesia Post Note  Patient: Pantelis Elgersma Smotherman  Procedure(s) Performed: NEPHROLITHOTOMY PERCUTANEOUS (Left )     Patient location during evaluation: PACU Anesthesia Type: General Level of consciousness: awake and alert Pain management: pain level controlled Vital Signs Assessment: post-procedure vital signs reviewed and stable Respiratory status: spontaneous breathing, nonlabored ventilation and respiratory function stable Cardiovascular status: blood pressure returned to baseline and stable Postop Assessment: no apparent nausea or vomiting Anesthetic complications: no    Last Vitals:  Vitals:   10/19/18 2056 10/19/18 2327  BP: (!) 133/51   Pulse: (!) 55 (!) 58  Resp: (!) 24 18  Temp: 36.6 C   SpO2: 96% 96%    Last Pain:  Vitals:   10/20/18 0240  TempSrc:   PainSc: 3                  Lidia Collum

## 2018-10-21 LAB — GLUCOSE, CAPILLARY
Glucose-Capillary: 287 mg/dL — ABNORMAL HIGH (ref 70–99)
Glucose-Capillary: 260 mg/dL — ABNORMAL HIGH (ref 70–99)
Glucose-Capillary: 260 mg/dL — ABNORMAL HIGH (ref 70–99)

## 2018-10-21 LAB — HEMOGLOBIN AND HEMATOCRIT, BLOOD
HCT: 32 % — ABNORMAL LOW (ref 39.0–52.0)
Hemoglobin: 10.3 g/dL — ABNORMAL LOW (ref 13.0–17.0)

## 2018-10-21 MED ORDER — CEPHALEXIN 500 MG PO CAPS: 500.0000 mg | ORAL_CAPSULE | Freq: Two times a day (BID) | ORAL | 0 refills | Status: DC

## 2018-10-21 NOTE — Progress Notes (Signed)
Inpatient Diabetes Program Recommendations  AACE/ADA: New Consensus Statement on Inpatient Glycemic Control (2015)  Target Ranges:  Prepandial:   less than 140 mg/dL      Peak postprandial:   less than 180 mg/dL (1-2 hours)      Critically ill patients:  140 - 180 mg/dL   Lab Results  Component Value Date   GLUCAP 287 (H) 10/21/2018   HGBA1C 7.9 (H) 10/15/2018    Review of Glycemic Control  Diabetes history: DM2 Outpatient Diabetes medications: Humulin N 290 units QHS, Humulin R 210-170-270 units tidwc Current orders for Inpatient glycemic control: Novolog 0-20 units tidwc  HgbA1C - 7.9% Endo - Dr. Loanne Drilling On megadoses of N and R insulin.  Inpatient Diabetes Program Recommendations:     Add NPH 100 units QHS Add Novolog 20 units tidwc for meal coverage insulin Add Novolog 0-5 units QHS  Follow.  Thank you. Lorenda Peck, RD, LDN, CDE Inpatient Diabetes Coordinator [phone]

## 2018-10-21 NOTE — Progress Notes (Signed)
2 Days Post-Op Subjective: Patient reports episode of pain last pm. Voiding well  Objective: Vital signs in last 24 hours: Temp:  [97.6 F (36.4 C)-98 F (36.7 C)] 98 F (36.7 C) (10/07 0520) Pulse Rate:  [56-63] 56 (10/07 0520) Resp:  [18-20] 20 (10/07 0520) BP: (129-150)/(46-56) 131/54 (10/07 0520) SpO2:  [93 %-97 %] 93 % (10/07 0520)  Intake/Output from previous day: 10/06 0701 - 10/07 0700 In: -  Out: 1740 [Urine:1740] Intake/Output this shift: No intake/output data recorded.  Physical Exam:  Constitutional: Vital signs reviewed. WD WN in NAD   Eyes: PERRL, No scleral icterus.   Cardiovascular: RRR Pulmonary/Chest: Normal effort Abdominal: Soft. Non-tender, non-distended, bowel sounds are normal, no masses, organomegaly, or guarding present. Minimal flank tenderness. No ecchymosis. Dressing dry. Genitourinary: Extremities: No cyanosis or edema   Lab Results: Recent Labs    10/19/18 1508 10/20/18 0526 10/21/18 0452  HGB 12.5* 9.5* 10.3*  HCT 39.5 31.8* 32.0*   BMET Recent Labs    10/19/18 0825  NA 143  K 3.6  CL 103  CO2 27  GLUCOSE 149*  BUN 33*  CREATININE 1.54*  CALCIUM 9.6   Recent Labs    10/19/18 0825  INR 1.0   No results for input(s): LABURIN in the last 72 hours. Results for orders placed or performed during the hospital encounter of 10/15/18  Novel Coronavirus, NAA (Hosp order, Send-out to Ref Lab; TAT 18-24 hrs     Status: None   Collection Time: 10/15/18  2:31 PM   Specimen: Nasopharyngeal Swab; Respiratory  Result Value Ref Range Status   SARS-CoV-2, NAA NOT DETECTED NOT DETECTED Final    Comment: (NOTE) This nucleic acid amplification test was developed and its performance characteristics determined by [employer]. Nucleic acid amplification tests include PCR and TMA. This test has not been FDA cleared or approved. This test has been authorized by FDA under an Emergency Use Authorization (EUA). This test is  only authorized for the duration of time the declaration that circumstances exist justifying the authorization of the emergency use of in vitro diagnostic tests for detection of SARS-CoV-2 virus and/or diagnosis of COVID-19 infection under section 564(b)(1) of the Act, 21 U.S.C. 308MVH-8(I) (1), unless the authorization is terminated or revoked sooner. When diagnostic testing is negative, the possibility of a false negative result should be considered in the context of a patient's recent exposures and the presence of clinical signs and symptoms consistent with COVID-19. An individual without symptoms of COVID- 19 and who is not shedding SARS-CoV-2 vi rus would expect to have a negative (not detected) result in this assay. Performed At: [facility] [address] [place], [place] [id number] Rush Farmer MD XL:[id number]    [place]  Final    Comment: Performed at [facility], [place] [address], [place], [place] [address]    Studies/Results: Dg C-arm 1-60 Min-no Report  Result Date: 10/19/2018 Fluoroscopy was utilized by the requesting physician.  No radiographic interpretation.   Ct Renal Stone Study  Result Date: 10/20/2018 CLINICAL DATA:  Flank pain.  Evaluate for stone disease. EXAM: CT ABDOMEN AND PELVIS WITHOUT CONTRAST TECHNIQUE: Multidetector CT imaging of the abdomen and pelvis was performed following the standard protocol without IV contrast. COMPARISON:  08/07/2018 FINDINGS: Lower chest: Subsegmental, subpleural atelectasis is noted along the posterior left lower lobe. Hepatobiliary: No suspicious liver abnormality identified. Multiple stones are identified within the gallbladder measuring up to 1.2 cm. No gallbladder wall thickening or inflammation. No  biliary ductal dilatation identified. Pancreas: Unremarkable. No pancreatic ductal dilatation or surrounding inflammatory changes. Spleen: Normal in size without focal abnormality.  Adrenals/Urinary Tract: Normal adrenal glands. Small stone is again noted within the inferior pole of the right kidney measuring 2 mm. Interval placement of left sided percutaneous nephrostomy tube. Tip of the catheter is at the level of the left UPJ. Gas noted within the left upper collecting system. A large stone within the inferior pole of the left kidney is again noted measuring 2.7 by 2.0 cm, image 99/4. High attenuation soft tissue stranding posterior and lateral to the inferior pole of left kidney may represent extravasated contrast and/or postprocedure hemorrhage. Foley catheter in place. Contrast material noted within partially collapsed urinary bladder. Soft tissue stranding around the bladder is noted. Stomach/Bowel: Stomach is within normal limits. No evidence of bowel wall thickening, distention, or inflammatory changes. Vascular/Lymphatic: Aortic atherosclerosis. No aneurysm. No abdominopelvic adenopathy. Reproductive: Mild prostate gland enlargement. Other: Small fat containing left inguinal hernia. Musculoskeletal: Thoracolumbar spondylosis identified. Status post interbody fusion and posterior hardware fixation at L4-5 with laminectomy. Multi level degenerative disc disease identified within the remaining lumbar disc spaces. IMPRESSION: 1. Large left lower pole renal calculus is again noted measuring 2.7 x 2.0 cm. 2. Interval placement of left posterior percutaneous nephrostomy tube. The tip of the tube terminates at the left UPJ. 3. High attenuation soft tissue stranding posterior and lateral to the inferior pole of left kidney may represent a postprocedure hemorrhage versus extravasated contrast material. 4. Punctate right lower pole renal calculus noted. 5. Gallstones 6.  Aortic Atherosclerosis (ICD10-I70.0). Electronically Signed   By: Kerby Moors M.D.   On: 10/20/2018 08:40   Ir Nephrostomy Placement Left  Result Date: 10/19/2018 INDICATION: History of left-sided nephrolithiasis, post  fluoroscopic guided placement of a access for nephrolithotomy earlier same day Unfortunately, the nephrolithotomy procedure was very difficult and ultimately percutaneous access was lost. As such, request made for placement of a new nephrostomy catheter for urinary diversion purposes. EXAM: FLUOROSCOPIC GUIDED LEFT PERCUTANEOUS NEPHROSTOMY TUBE PLACEMENT. COMPARISON:  Fluoroscopic guided left-sided percutaneous nephrolithotomy access-earlier same day; CT abdomen pelvis-08/07/2018 MEDICATIONS: The antibiotic was administered in an appropriate time frame prior to skin puncture. ANESTHESIA/SEDATION: General anesthesia as provided by the anesthesia service. CONTRAST:  Not provided FLUOROSCOPY TIME:  Not provided COMPLICATIONS: None immediate. PROCEDURE: The patient had been previously prepped and draped in usual sterile fashion by the operating room staff. Initial attempts were made to recannulate the collecting system via the initial percutaneous access however this ultimately proved unsuccessful. As such, an contrast opacified mid posterior pole calyx was targeted fluoroscopically with a [place] needle. Access to the collecting system was confirmed with limited contrast injection and advancement of a Nitrex wire through the left renal pelvis to the level of the superior aspect the left ureter. Under fluoroscopic guidance, the track was dilated with an [place] set. Contrast injection confirmed appropriate positioning. Next, an Amplatz wire was advanced to the level the superior aspect of the right ureter. The track was dilated ultimately long placement of a 10 French percutaneous nephrostomy catheter with end terminating within the superior aspect of left ureter. Contrast injection confirmed appropriate position functionality of nephrostomy catheter The catheter was secured at the skin with a Prolene retention suture and a gravity bag was placed. A dressing was placed. The patient tolerated procedure well  without immediate postprocedural complication. FINDINGS: Initial contrast injection of the existing Kumpe catheter demonstrated extravasation of contrast about right kidney. Despite  prolonged efforts, the right renal collecting system could not be recannulated from the initial percutaneous access. After fluoroscopically targeting posterior midpole calyx, 10 French percutaneous nephrostomy catheter was placed for the purposes of urinary diversion with end terminating within the superior aspect the left ureter. Note, the nephrostomy could not be coiled at the level of the left renal pelvis secondary to the large mid/inferior pole stone as well as blood products within the left renal pelvis. IMPRESSION: Technically successful fluoroscopic guided placement of a new mid posterior caliceal approach 10 French nephrostomy catheter with tip terminating within the superior aspect of the left ureter. PLAN: - patient's nephrostomy catheter well maintained to external drainage. - The ultimate plan for repeat nephrolithotomy will be performed at the discretion of Dr. Diona Fanti, however potentially this nephrostomy access could be utilized during future percutaneous interventions. Electronically Signed   By: Sandi Mariscal M.D.   On: 10/19/2018 16:25   Ir Ureteral Stent Left New Access W/o Sep Nephrostomy Cath  Result Date: 10/19/2018 INDICATION: Renal stones, access for left-sided percutaneous nephrolithotomy. EXAM: 1. FLUOROSCOPIC GUIDANCE FOR PUNCTURE OF THE LEFT SIDED RENAL COLLECTING SYSTEM. 2. FLUOROSCOPIC GUIDED PLACEMENT OF A LEFT SIDED NEPHROURETERAL CATHETER. COMPARISON:  CT of the abdomen and pelvis-08/07/2018 MEDICATIONS: Ancef 2 gm IV; The antibiotic was administered in an appropriate time frame prior to skin puncture. ANESTHESIA/SEDATION: Moderate (conscious) sedation was employed during this procedure. A total of Versed 2 mg and Fentanyl 150 mcg was administered intravenously. Moderate Sedation Time: 25 minutes.  The patient's level of consciousness and vital signs were monitored continuously by radiology nursing throughout the procedure under my direct supervision. CONTRAST:  15 cc Isovue-300 - administered into the left renal collecting system. FLUOROSCOPY TIME:  7 minutes, 48 seconds (226 mGy) COMPLICATIONS: None immediate. PROCEDURE: Informed written consent was obtained from the patient after a discussion of the risks, benefits, and alternatives to treatment. The left flank region was prepped with Betadine in a sterile fashion, and a sterile drape was applied covering the operative field. A sterile gown and sterile gloves were used for the procedure. A timeout was performed prior to the initiation of the procedure. A pre procedural spot fluoroscopic image was obtained of the upper abdomen. The stone within the inferior aspect of the left renal pelvis extending to involve several inferior pole calices was targeted fluoroscopically with a [place] needle. Access to the collecting system was confirmed with contrast injection. Next, a Nitrex wire was advanced beyond the stone to the level the superior aspect ureter. An [place] set was utilized to dilate the tract and was subsequently exchanged for a Kumpe catheter over a stiff Glidewire. The Kumpe catheter was advanced down the ureter and into the urinary bladder. Postprocedural spot radiographs were obtained in various obliquities and the catheter was sutured to the skin. The catheter was capped and a dressing was placed. The patient tolerated the procedure well without immediate post procedural complication. FINDINGS: Pre procedural spot radiographic images demonstrates an ill-defined approximately 2.8 x 2.0 cm stone overlying expected location of the inferior pole the left kidney. A posteroinferior lateral aspect of the ill-defined stone was targeted fluoroscopically. Appropriate access to the collecting system was confirmed with limited contrast injection.  Ultimately, a Kumpe catheter was advanced beyond the pelvic/calyceal stone with tip terminating within the urinary bladder. IMPRESSION: Successful fluoroscopic guided placement of a left sided 5 [place] Kumpe catheter to the level of the urinary bladder to be utilized during impending nephrolithotomy procedure. Electronically Signed  By: Sandi Mariscal M.D.   On: 10/19/2018 11:18    Assessment/Plan:   POD 2 aborted left PCNL. Doing well. Will cap pcn tube. Discussed w/ pt no further rx, chemolysis w/ K citrate, repeat pcnl. He is leaning toward chemolysis. Will cap n tube, possibly d/c later today, possibly with tube removal before.   LOS: 1 day   Lillette Boxer Zabdi Mis 10/21/2018, 7:15 AM

## 2018-10-22 NOTE — Discharge Summary (Signed)
Patient ID: Donald [name] MRN: [id number] DOB/AGE: 1945/05/09 73 y.o.  Admit date: 10/19/2018 Discharge date: 10/22/2018  Primary Care Physician:  Ria Bush, MD  Discharge Diagnoses: Left renal calculus Present on Admission: **None**   Consults:  None     Discharge Medications: Allergies as of 10/21/2018      Reactions   Codeine Other (See Comments)   REACTION: chest pain   Pioglitazone Other (See Comments)   REACTION to Actos: swelling in ankles      Medication List    STOP taking these medications   aspirin EC 81 MG tablet     TAKE these medications   allopurinol 300 MG tablet Commonly known as: ZYLOPRIM TAKE ONE TABLET BY MOUTH EVERY DAY What changed: when to take this   ALPRAZolam 1 MG tablet Commonly known as: XANAX TAKE ONE TABLET BY MOUTH AT BEDTIME AS NEEDED FOR SLEEP What changed:   how much to take  when to take this  additional instructions   amLODipine 5 MG tablet Commonly known as: NORVASC Take 1 tablet (5 mg total) by mouth daily.   cephALEXin 500 MG capsule Commonly known as: KEFLEX Take 1 capsule (500 mg total) by mouth 2 (two) times daily.   colchicine 0.6 MG tablet Commonly known as: Colcrys TAKE 1 TABLET BY MOUTH EVERY HOUR UNTIL SYMPTOMS ARE BETTER, MAX OF 6TABS PER DAY. STOP IF DIARRHEA What changed:   how much to take  how to take this  when to take this  reasons to take this  additional instructions   cyanocobalamin 1000 MCG/ML injection Commonly known as: (VITAMIN B-12) Inject 1,000 mcg into the muscle every 30 (thirty) days.   fluticasone 50 MCG/ACT nasal spray Commonly known as: FLONASE Place 2 sprays into both nostrils daily. What changed:   when to take this  reasons to take this   furosemide 80 MG tablet Commonly known as: LASIX Take 1 tablet (80 mg total) by mouth daily.   gabapentin 600 MG tablet Commonly known as: NEURONTIN TAKE TWO TABLETS (1200MG ) BY MOUTH AT BEDTIME What changed:  See the new instructions.   GE100 Blood Glucose Test test strip Generic drug: glucose blood 1 each by Other route 4 (four) times daily. E11.21   HYDROcodone-acetaminophen 10-325 MG tablet Commonly known as: NORCO Take 1 tablet by mouth every 6 (six) hours as needed for moderate pain ((typically once to twice daily if needed)).   insulin NPH Human 100 UNIT/ML injection Commonly known as: HumuLIN N Inject 2.9 mLs (290 Units total) into the skin at bedtime.   insulin regular 100 units/mL injection Commonly known as: NovoLIN R ReliOn INJECT 210 UNITS INTO THE SKIN BEFORE BREAKFAST, INJECT 170 UNITS BEFORE LUNCH, AND INJECT 270 UNITS BEFORE DINNER. (JUST BEFORE EACH MEAL) What changed:   how much to take  how to take this  when to take this  additional instructions   losartan 100 MG tablet Commonly known as: COZAAR TAKE ONE TABLET BY MOUTH AT BEDTIME   lovastatin 40 MG tablet Commonly known as: MEVACOR TAKE 2 TABLETS BY MOUTH (80MG  TOTAL) AT BEDTIME What changed: See the new instructions.   ondansetron 4 MG tablet Commonly known as: ZOFRAN Take 1 tablet (4 mg total) by mouth every 4 (four) hours as needed for nausea.   OPCON-A OP Place 1 drop into both eyes 3 (three) times daily as needed (itchy eyes.).   STOOL SOFTENER & LAXATIVE PO Take 2 tablets by mouth at bedtime.  Significant Diagnostic Studies:  Dg C-arm 1-60 Min-no Report  Result Date: 10/19/2018 Fluoroscopy was utilized by the requesting physician.  No radiographic interpretation.   Ct Renal Stone Study  Result Date: 10/20/2018 CLINICAL DATA:  Flank pain.  Evaluate for stone disease. EXAM: CT ABDOMEN AND PELVIS WITHOUT CONTRAST TECHNIQUE: Multidetector CT imaging of the abdomen and pelvis was performed following the standard protocol without IV contrast. COMPARISON:  08/07/2018 FINDINGS: Lower chest: Subsegmental, subpleural atelectasis is noted along the posterior left lower lobe. Hepatobiliary: No  suspicious liver abnormality identified. Multiple stones are identified within the gallbladder measuring up to 1.2 cm. No gallbladder wall thickening or inflammation. No biliary ductal dilatation identified. Pancreas: Unremarkable. No pancreatic ductal dilatation or surrounding inflammatory changes. Spleen: Normal in size without focal abnormality. Adrenals/Urinary Tract: Normal adrenal glands. Small stone is again noted within the inferior pole of the right kidney measuring 2 mm. Interval placement of left sided percutaneous nephrostomy tube. Tip of the catheter is at the level of the left UPJ. Gas noted within the left upper collecting system. A large stone within the inferior pole of the left kidney is again noted measuring 2.7 by 2.0 cm, image 99/4. High attenuation soft tissue stranding posterior and lateral to the inferior pole of left kidney may represent extravasated contrast and/or postprocedure hemorrhage. Foley catheter in place. Contrast material noted within partially collapsed urinary bladder. Soft tissue stranding around the bladder is noted. Stomach/Bowel: Stomach is within normal limits. No evidence of bowel wall thickening, distention, or inflammatory changes. Vascular/Lymphatic: Aortic atherosclerosis. No aneurysm. No abdominopelvic adenopathy. Reproductive: Mild prostate gland enlargement. Other: Small fat containing left inguinal hernia. Musculoskeletal: Thoracolumbar spondylosis identified. Status post interbody fusion and posterior hardware fixation at L4-5 with laminectomy. Multi level degenerative disc disease identified within the remaining lumbar disc spaces. IMPRESSION: 1. Large left lower pole renal calculus is again noted measuring 2.7 x 2.0 cm. 2. Interval placement of left posterior percutaneous nephrostomy tube. The tip of the tube terminates at the left UPJ. 3. High attenuation soft tissue stranding posterior and lateral to the inferior pole of left kidney may represent a  postprocedure hemorrhage versus extravasated contrast material. 4. Punctate right lower pole renal calculus noted. 5. Gallstones 6.  Aortic Atherosclerosis (ICD10-I70.0). Electronically Signed   By: Kerby Moors M.D.   On: 10/20/2018 08:40   Ir Nephrostomy Placement Left  Result Date: 10/19/2018 INDICATION: History of left-sided nephrolithiasis, post fluoroscopic guided placement of a access for nephrolithotomy earlier same day Unfortunately, the nephrolithotomy procedure was very difficult and ultimately percutaneous access was lost. As such, request made for placement of a new nephrostomy catheter for urinary diversion purposes. EXAM: FLUOROSCOPIC GUIDED LEFT PERCUTANEOUS NEPHROSTOMY TUBE PLACEMENT. COMPARISON:  Fluoroscopic guided left-sided percutaneous nephrolithotomy access-earlier same day; CT abdomen pelvis-08/07/2018 MEDICATIONS: The antibiotic was administered in an appropriate time frame prior to skin puncture. ANESTHESIA/SEDATION: General anesthesia as provided by the anesthesia service. CONTRAST:  Not provided FLUOROSCOPY TIME:  Not provided COMPLICATIONS: None immediate. PROCEDURE: The patient had been previously prepped and draped in usual sterile fashion by the operating room staff. Initial attempts were made to recannulate the collecting system via the initial percutaneous access however this ultimately proved unsuccessful. As such, an contrast opacified mid posterior pole calyx was targeted fluoroscopically with a [place] needle. Access to the collecting system was confirmed with limited contrast injection and advancement of a Nitrex wire through the left renal pelvis to the level of the superior aspect the left ureter. Under fluoroscopic guidance,  the track was dilated with an Accustick set. Contrast injection confirmed appropriate positioning. Next, an Amplatz wire was advanced to the level the superior aspect of the right ureter. The track was dilated ultimately long placement of a  10 French percutaneous nephrostomy catheter with end terminating within the superior aspect of left ureter. Contrast injection confirmed appropriate position functionality of nephrostomy catheter The catheter was secured at the skin with a Prolene retention suture and a gravity bag was placed. A dressing was placed. The patient tolerated procedure well without immediate postprocedural complication. FINDINGS: Initial contrast injection of the existing Kumpe catheter demonstrated extravasation of contrast about right kidney. Despite prolonged efforts, the right renal collecting system could not be recannulated from the initial percutaneous access. After fluoroscopically targeting posterior midpole calyx, 10 French percutaneous nephrostomy catheter was placed for the purposes of urinary diversion with end terminating within the superior aspect the left ureter. Note, the nephrostomy could not be coiled at the level of the left renal pelvis secondary to the large mid/inferior pole stone as well as blood products within the left renal pelvis. IMPRESSION: Technically successful fluoroscopic guided placement of a new mid posterior caliceal approach 10 French nephrostomy catheter with tip terminating within the superior aspect of the left ureter. PLAN: - patient's nephrostomy catheter well maintained to external drainage. - The ultimate plan for repeat nephrolithotomy will be performed at the discretion of Dr. Diona Fanti, however potentially this nephrostomy access could be utilized during future percutaneous interventions. Electronically Signed   By: Sandi Mariscal M.D.   On: 10/19/2018 16:25   Ir Ureteral Stent Left New Access W/o Sep Nephrostomy Cath  Result Date: 10/19/2018 INDICATION: Renal stones, access for left-sided percutaneous nephrolithotomy. EXAM: 1. FLUOROSCOPIC GUIDANCE FOR PUNCTURE OF THE LEFT SIDED RENAL COLLECTING SYSTEM. 2. FLUOROSCOPIC GUIDED PLACEMENT OF A LEFT SIDED NEPHROURETERAL CATHETER. COMPARISON:   CT of the abdomen and pelvis-08/07/2018 MEDICATIONS: Ancef 2 gm IV; The antibiotic was administered in an appropriate time frame prior to skin puncture. ANESTHESIA/SEDATION: Moderate (conscious) sedation was employed during this procedure. A total of Versed 2 mg and Fentanyl 150 mcg was administered intravenously. Moderate Sedation Time: 25 minutes. The patient's level of consciousness and vital signs were monitored continuously by radiology nursing throughout the procedure under my direct supervision. CONTRAST:  15 cc Isovue-300 - administered into the left renal collecting system. FLUOROSCOPY TIME:  7 minutes, 48 seconds (941 mGy) COMPLICATIONS: None immediate. PROCEDURE: Informed written consent was obtained from the patient after a discussion of the risks, benefits, and alternatives to treatment. The left flank region was prepped with Betadine in a sterile fashion, and a sterile drape was applied covering the operative field. A sterile gown and sterile gloves were used for the procedure. A timeout was performed prior to the initiation of the procedure. A pre procedural spot fluoroscopic image was obtained of the upper abdomen. The stone within the inferior aspect of the left renal pelvis extending to involve several inferior pole calices was targeted fluoroscopically with a Dozier needle. Access to the collecting system was confirmed with contrast injection. Next, a Nitrex wire was advanced beyond the stone to the level the superior aspect ureter. An Accustick set was utilized to dilate the tract and was subsequently exchanged for a Kumpe catheter over a stiff Glidewire. The Kumpe catheter was advanced down the ureter and into the urinary bladder. Postprocedural spot radiographs were obtained in various obliquities and the catheter was sutured to the skin. The catheter was capped and a  dressing was placed. The patient tolerated the procedure well without immediate post procedural complication. FINDINGS:  Pre procedural spot radiographic images demonstrates an ill-defined approximately 2.8 x 2.0 cm stone overlying expected location of the inferior pole the left kidney. A posteroinferior lateral aspect of the ill-defined stone was targeted fluoroscopically. Appropriate access to the collecting system was confirmed with limited contrast injection. Ultimately, a Kumpe catheter was advanced beyond the pelvic/calyceal stone with tip terminating within the urinary bladder. IMPRESSION: Successful fluoroscopic guided placement of a left sided 5 [place] Kumpe catheter to the level of the urinary bladder to be utilized during impending nephrolithotomy procedure. Electronically Signed   By: Sandi Mariscal M.D.   On: 10/19/2018 11:18    Brief H and P: For complete details please refer to admission H and P, but in brief the patient is admitted for percutaneous management of a large left renal calculus  Hospital Course: The patient had his percutaneous access placed in interventional radiology prior to the procedure.  He was then taken to the operating room where percutaneous access was established.  However, due to significant bleeding as well as inability to visualize the stone despite an extended effort, the procedure was aborted.  New left upper pole access was performed in the operating room as the lower pole access was lost.  Following this, the patient was then awakened and taken to the PACU.  He had a relatively uncomplicated postoperative course.  He did have decreased hemoglobin postoperative day #1, but this remained stable.  He was discharged on postoperative day #2 with his nephrostomy tube having been removed.  We decided to eventually proceed withc chemolysis for management of this probable uric acid stone. Active Problems:   Calculus of kidney   Day of Discharge BP 138/65 (BP Location: Left Wrist)   Pulse (!) 58   Temp 98.2 F (36.8 C)   Resp 18   Ht 5' 5.5" (1.664 m)   Wt 127 kg   SpO2 95%   BMI  45.88 kg/m   Results for orders placed or performed during the hospital encounter of 10/19/18 (from the past 24 hour(s))  Glucose, capillary     Status: Abnormal   Collection Time: 10/21/18  4:48 PM  Result Value Ref Range   Glucose-Capillary 260 (H) 70 - 99 mg/dL  BLOOD TRANSFUSION REPORT - SCANNED     Status: None   Collection Time: 10/22/18  9:45 AM   Narrative   Ordered by an unspecified provider.    Physical Exam: General: Alert and awake oriented x3 not in any acute distress. HEENT: anicteric sclera, pupils reactive to light and accommodation CVS: S1-S2 clear no murmur rubs or gallops Chest: clear to auscultation bilaterally, no wheezing rales or rhonchi Abdomen: soft nontender, nondistended, normal bowel sounds, no organomegaly Extremities: no cyanosis, clubbing or edema noted bilaterally Neuro: Cranial nerves II-XII intact, no focal neurological deficits  Disposition: Home  Diet: Regular low-carb  Activity: Discussed with patient and wife   Disposition and Follow-up:   He will follow-up in approximately 2 weeks, we will call for that appointment  TESTS THAT NEED FOLLOW-UP  We will eventually perform 24-hour urine collection  DISCHARGE FOLLOW-UP    Time spent on Discharge:  20 minutes  Signed: Lillette Boxer Antar Milks 10/22/2018, 12:27 PM

## 2018-10-24 ENCOUNTER — Other Ambulatory Visit: Payer: Self-pay | Admitting: Endocrinology

## 2018-10-24 DIAGNOSIS — E119 Type 2 diabetes mellitus without complications: Secondary | ICD-10-CM

## 2018-10-27 ENCOUNTER — Telehealth: Payer: Self-pay

## 2018-10-27 ENCOUNTER — Ambulatory Visit: Payer: Medicare Other | Admitting: Nurse Practitioner

## 2018-10-27 NOTE — Telephone Encounter (Signed)
Noted. Will await ER eval.  

## 2018-10-27 NOTE — Telephone Encounter (Signed)
Pt has been shaking for 1 wk; pt has been taking oxycodone for kidney stone. Pt has not take oxycodone in one wk. Pt is very weak. Pt is having SOB upon exertion. Pt weighed today he weighed 291 and last card appt 10/08/18  pt weighed 280. Both lower legs and ankles and feet are very swollen. Pt has been taking Furosemide 80 mg one daily; pt wants to know if can increase fluid pill 1/2 to 1tab, 10/08/18 when saw card increased amlodipine 10 mg daily per pt..Dr Darnell Level said amlodipine should be 5 mg daily. Take 1 extra furosemide 80 mg now and pt scheduled30' in office  appt with DR G on 10/28/18 at 11 AM. ED precautions given and pt is to keep feet up when sitting. Pt wife said for few days pt is sleeping all the time and his speech is not exactly slurred but pt talks extremely slow. Mrs Tootle is concerned about pts speech and shaking and is going to take pt to ED now but wants to keep appt with Dr Darnell Level on 10/28/18. FYI o Dr Darnell Level.

## 2018-10-27 NOTE — Progress Notes (Deleted)
Office Visit    Patient Name: Donald [name] Date of Encounter: 10/27/2018  Primary Care Provider:  Ria Bush, MD Primary Cardiologist:  Ida Rogue, MD  Chief Complaint    73 year old male with history of HFpEF (EF 60%-65%), coronary artery disease (cath 2012 with no critical high grade obstructive dz, elevated LVEDP), poorly controlled and insulin dependent diabetes, hyperlipidemia, PAD, 2009 DVT and extensive bilateral pulmonary embolism, CKD 3, OSA on CPAP, asthmatic bronchitis, morbid obesity, gout, GERD, anemia, history of hematuria, s/p parathyroidectomy (right sided, 2018), hypercalcemia, chronic back pain s/p surgeries, and who presents today for f/u of lower extremity swelling.  Past Medical History    Past Medical History:  Diagnosis Date  . (HFpEF) heart failure with preserved ejection fraction ([place])    a. 09/2018 Echo: EF 60-65%. PASP 33mmHg. Mild-mod LAE. Mild MR/TR.  Marland Kitchen ANEMIA-IRON DEFICIENCY 07/26/2006  . Anxiety   . ASTHMA 07/26/2006  . Asthma   . Back pain, chronic   . Bell's palsy   . CKD (chronic kidney disease), stage III   . COLONIC POLYPS, HX OF 07/26/2006  . DEPRESSION 03/14/2009  . DIABETES MELLITUS, TYPE II 07/26/2006  . [place] DISEASE, LUMBAR 10/05/2007  . DVT 12/03/2007  . DYSLIPIDEMIA 04/13/2009  . Dyspnea    when gets up and walks around and back is hurting really bad -only Shortness of breath  then  . GERD 07/26/2006  . Gout   . Heart murmur   . History of kidney stones   . HYPERTENSION 07/26/2006  . INSOMNIA 08/21/2007  . Neuropathy   . Nonobstructive CAD (coronary artery disease)    a. 2012 Cath: no high grade stenosis; b. 2018 MV: No ischemia. Attenuation artifact.   . OBSTRUCTIVE SLEEP APNEA 12/03/2007   Use C-PAP  . PERIPHERAL NEUROPATHY 07/26/2006  . Pernicious anemia 11/20/2006  . PULMONARY EMBOLISM 10/05/2007   Past Surgical History:  Procedure Laterality Date  . APPENDECTOMY  1968  . [place], 06/14/2008   Dr. Trenton Gammon  at [facility] (06/10)  . CARDIAC CATHETERIZATION    . CARPAL TUNNEL RELEASE Right 10/2017  . CATARACT EXTRACTION W/PHACO Left 05/30/2014   Procedure: CATARACT EXTRACTION PHACO AND INTRAOCULAR LENS PLACEMENT (IOC);  Surgeon: Estill Cotta, MD;  Location: [facility] ORS;  Service: Ophthalmology;  Laterality: Left;  [name] 01:20 AP% 23.7 CDE 31.86  . COLONOSCOPY  10/2002   HP, SSA, TA, rpt 3 yrs ([place])  . COLONOSCOPY  01/2005   diverticulosis rpt 5 yrs ([place])   . IR NEPHROSTOMY PLACEMENT LEFT  10/19/2018  . IR URETERAL STENT LEFT NEW ACCESS W/O SEP NEPHROSTOMY CATH  10/19/2018  . LITHOTRIPSY     X 2  . NEPHROLITHOTOMY Left 10/19/2018   Procedure: NEPHROLITHOTOMY PERCUTANEOUS;  Surgeon: Franchot Gallo, MD;  Location: [facility] ORS;  Service: Urology;  Laterality: Left;  3 HRS  . PARATHYROIDECTOMY Right 04/18/2016   PARATHYROIDECTOMY for cyst [name], MD)  . SHOULDER ARTHROSCOPY W/ ROTATOR CUFF REPAIR Right   . TONSILLECTOMY      Allergies  Allergies  Allergen Reactions  . Codeine Other (See Comments)    REACTION: chest pain  . Pioglitazone Other (See Comments)    REACTION to Actos: swelling in ankles    History of Present Illness    73 year old male with the above past medical history and presenting today for follow-up of lower extremity swelling in the setting of chronic kidney disease stage III (followed by nephrology).  He was seen 09/23/18 for cardiology  evaluation before a urology procedure. At that time, he c/o back pain and DOE but was still able to perform household chores. Recommendation was to check his BP twice daily due to elevated BP in clinic. He was also instructed to increase his Lasix to 80 mg twice daily for 3 days, after which time he was to return to his usual dose. Follow-up BMET showed his Cr improved from 1.81  1.57 within the same month. An updated echocardiogram that showed EF 60 to 65% with elevated PASP of 57 mmHg.   At follow-up on 10/08/2018, he noted  improvement in lower extremity edema with return of baseline trace to 1+ bilateral lower extremity edema.  His weight was down 4 pounds from 9/9; however, it was up 10 pounds since March with the patient indicating he would like to focus on weight loss. Unfortunately, his activity was still limited due to chronic back pain.  His chronic DOE was unchanged.  He was not checking his blood pressure at home and continued to eat out most meals during the week.  Blood pressure was 148/62 and still elevated but improved from his previous clinic visit. Amlodipine was increased to 5mg  daily.  Home Medications    Prior to Admission medications   Medication Sig Start Date End Date Taking? Authorizing Provider  allopurinol (ZYLOPRIM) 300 MG tablet TAKE ONE TABLET BY MOUTH EVERY DAY Patient taking differently: Take 300 mg by mouth at bedtime.  10/12/18   Ria Bush, MD  ALPRAZolam Duanne Moron) 1 MG tablet TAKE ONE TABLET BY MOUTH AT BEDTIME AS NEEDED FOR SLEEP Patient taking differently: Take 0.5 mg by mouth at bedtime.  09/11/18   Ria Bush, MD  amLODipine (NORVASC) 5 MG tablet Take 1 tablet (5 mg total) by mouth daily. 10/08/18 01/06/19  Theora Gianotti, NP  cephALEXin (KEFLEX) 500 MG capsule Take 1 capsule (500 mg total) by mouth 2 (two) times daily. 10/21/18   Franchot Gallo, MD  colchicine (COLCRYS) 0.6 MG tablet TAKE 1 TABLET BY MOUTH EVERY HOUR UNTIL SYMPTOMS ARE BETTER, MAX OF 6TABS PER DAY. STOP IF DIARRHEA Patient taking differently: Take 0.6 mg by mouth daily as needed (gout flare ups.). MAX OF 6TABS PER DAY. STOP IF DIARRHEA 01/13/17   Renato Shin, MD  cyanocobalamin (,VITAMIN B-12,) 1000 MCG/ML injection Inject 1,000 mcg into the muscle every 30 (thirty) days.    [provider]  fluticasone (FLONASE) 50 MCG/ACT nasal spray Place 2 sprays into both nostrils daily. Patient taking differently: Place 2 sprays into both nostrils daily as needed (allergies.).  04/28/18    Ria Bush, MD  furosemide (LASIX) 80 MG tablet Take 1 tablet (80 mg total) by mouth daily. 12/19/17   Ria Bush, MD  gabapentin (NEURONTIN) 600 MG tablet TAKE TWO TABLETS (1200MG ) BY MOUTH AT BEDTIME Patient taking differently: Take 1,200 mg by mouth at bedtime.  09/11/18   Ria Bush, MD  [phone] BLOOD GLUCOSE TEST test strip TEST FOUR TIMES DAILY 10/24/18   Renato Shin, MD  HYDROcodone-acetaminophen [facility]) 10-325 MG tablet Take 1 tablet by mouth every 6 (six) hours as needed for moderate pain ((typically once to twice daily if needed)).  12/19/17   Ria Bush, MD  insulin NPH Human (HUMULIN N) 100 UNIT/ML injection Inject 2.9 mLs (290 Units total) into the skin at bedtime. 06/04/18   Renato Shin, MD  insulin regular (NOVOLIN R RELION) 100 units/mL injection INJECT 210 UNITS INTO THE SKIN BEFORE BREAKFAST, INJECT 170 UNITS BEFORE LUNCH, AND INJECT 270  UNITS BEFORE DINNER. (JUST BEFORE EACH MEAL) Patient taking differently: Inject 200-270 Units into the skin See admin instructions. INJECT 210 UNITS INTO THE SKIN BEFORE BREAKFAST(OFTEN SKIPS BREAKFAST) , INJECT 200 UNITS BEFORE LUNCH, AND INJECT 270 UNITS BEFORE DINNER. (JUST BEFORE EACH MEAL) 08/04/18   Renato Shin, MD  losartan (COZAAR) 100 MG tablet TAKE ONE TABLET BY MOUTH AT BEDTIME Patient taking differently: Take 100 mg by mouth at bedtime.  06/18/18   Ria Bush, MD  lovastatin (MEVACOR) 40 MG tablet TAKE 2 TABLETS BY MOUTH (80MG  TOTAL) AT BEDTIME Patient taking differently: Take 80 mg by mouth at bedtime.  08/14/18   Tonia Ghent, MD  Naphazoline-Pheniramine (OPCON-A OP) Place 1 drop into both eyes 3 (three) times daily as needed (itchy eyes.).     [provider]  ondansetron (ZOFRAN) 4 MG tablet Take 1 tablet (4 mg total) by mouth every 4 (four) hours as needed for nausea. 08/10/18   Lesleigh Noe, MD  Sennosides-Docusate Sodium (STOOL SOFTENER & LAXATIVE PO) Take 2 tablets by mouth at bedtime.     [provider]    Review of Systems    ***.  All other systems reviewed and are otherwise negative except as noted above.  Physical Exam    VS:  There were no vitals taken for this visit. , BMI There is no height or weight on file to calculate BMI. GEN: Well nourished, well developed, in no acute distress. HEENT: normal. Neck: Supple, no JVD, carotid bruits, or masses. Cardiac: RRR, no murmurs, rubs, or gallops. No clubbing, cyanosis, edema.  Radials/DP/PT 2+ and equal bilaterally.  Respiratory:  Respirations regular and unlabored, clear to auscultation bilaterally. GI: Soft, nontender, nondistended, BS + x 4. MS: no deformity or atrophy. Skin: warm and dry, no rash. Neuro:  Strength and sensation are intact. Psych: Normal affect.  Accessory Clinical Findings    ECG personally reviewed by me today - *** - no acute changes.  Assessment & Plan    1.  ***   Arvil Chaco, PA-C 10/27/2018, 8:32 AM

## 2018-10-28 ENCOUNTER — Ambulatory Visit (INDEPENDENT_AMBULATORY_CARE_PROVIDER_SITE_OTHER): Payer: Medicare Other | Admitting: Family Medicine

## 2018-10-28 ENCOUNTER — Other Ambulatory Visit: Payer: Self-pay

## 2018-10-28 ENCOUNTER — Encounter: Payer: Self-pay | Admitting: Family Medicine

## 2018-10-28 VITALS — BP 122/50 | HR 58 | Temp 98.0°F | Ht 65.5 in | Wt 287.2 lb

## 2018-10-28 DIAGNOSIS — I5033 Acute on chronic diastolic (congestive) heart failure: Secondary | ICD-10-CM

## 2018-10-28 DIAGNOSIS — N1832 Chronic kidney disease, stage 3b: Secondary | ICD-10-CM | POA: Diagnosis not present

## 2018-10-28 DIAGNOSIS — Z794 Long term (current) use of insulin: Secondary | ICD-10-CM

## 2018-10-28 DIAGNOSIS — E1122 Type 2 diabetes mellitus with diabetic chronic kidney disease: Secondary | ICD-10-CM

## 2018-10-28 DIAGNOSIS — I10 Essential (primary) hypertension: Secondary | ICD-10-CM | POA: Diagnosis not present

## 2018-10-28 DIAGNOSIS — IMO0002 Reserved for concepts with insufficient information to code with codable children: Secondary | ICD-10-CM

## 2018-10-28 DIAGNOSIS — E1121 Type 2 diabetes mellitus with diabetic nephropathy: Secondary | ICD-10-CM | POA: Diagnosis not present

## 2018-10-28 DIAGNOSIS — N200 Calculus of kidney: Secondary | ICD-10-CM | POA: Diagnosis not present

## 2018-10-28 DIAGNOSIS — N183 Chronic kidney disease, stage 3 unspecified: Secondary | ICD-10-CM

## 2018-10-28 DIAGNOSIS — E1142 Type 2 diabetes mellitus with diabetic polyneuropathy: Secondary | ICD-10-CM | POA: Diagnosis not present

## 2018-10-28 DIAGNOSIS — R0602 Shortness of breath: Secondary | ICD-10-CM

## 2018-10-28 DIAGNOSIS — K5903 Drug induced constipation: Secondary | ICD-10-CM | POA: Diagnosis not present

## 2018-10-28 DIAGNOSIS — E1165 Type 2 diabetes mellitus with hyperglycemia: Secondary | ICD-10-CM | POA: Diagnosis not present

## 2018-10-28 DIAGNOSIS — I2510 Atherosclerotic heart disease of native coronary artery without angina pectoris: Secondary | ICD-10-CM

## 2018-10-28 DIAGNOSIS — I5030 Unspecified diastolic (congestive) heart failure: Secondary | ICD-10-CM | POA: Insufficient documentation

## 2018-10-28 LAB — CBC WITH DIFFERENTIAL/PLATELET
Basophils Absolute: 0.1 10*3/uL (ref 0.0–0.1)
Basophils Relative: 0.7 % (ref 0.0–3.0)
Eosinophils Absolute: 0.4 10*3/uL (ref 0.0–0.7)
Eosinophils Relative: 5.4 % — ABNORMAL HIGH (ref 0.0–5.0)
HCT: 30.8 % — ABNORMAL LOW (ref 39.0–52.0)
Hemoglobin: 10.2 g/dL — ABNORMAL LOW (ref 13.0–17.0)
Lymphocytes Relative: 15.6 % (ref 12.0–46.0)
Lymphs Abs: 1.3 10*3/uL (ref 0.7–4.0)
MCHC: 33 g/dL (ref 30.0–36.0)
MCV: 88.3 fl (ref 78.0–100.0)
Monocytes Absolute: 0.6 10*3/uL (ref 0.1–1.0)
Monocytes Relative: 7.3 % (ref 3.0–12.0)
Neutro Abs: 5.8 10*3/uL (ref 1.4–7.7)
Neutrophils Relative %: 71 % (ref 43.0–77.0)
Platelets: 220 10*3/uL (ref 150.0–400.0)
RBC: 3.48 Mil/uL — ABNORMAL LOW (ref 4.22–5.81)
RDW: 16.6 % — ABNORMAL HIGH (ref 11.5–15.5)
WBC: 8.1 10*3/uL (ref 4.0–10.5)

## 2018-10-28 LAB — COMPREHENSIVE METABOLIC PANEL
ALT: 13 U/L (ref 0–53)
AST: 10 U/L (ref 0–37)
Albumin: 3.6 g/dL (ref 3.5–5.2)
Alkaline Phosphatase: 80 U/L (ref 39–117)
BUN: 62 mg/dL — ABNORMAL HIGH (ref 6–23)
CO2: 31 mEq/L (ref 19–32)
Calcium: 8.9 mg/dL (ref 8.4–10.5)
Chloride: 102 mEq/L (ref 96–112)
Creatinine, Ser: 2.51 mg/dL — ABNORMAL HIGH (ref 0.40–1.50)
GFR: 25.48 mL/min — ABNORMAL LOW (ref >60.00)
Glucose, Bld: 132 mg/dL — ABNORMAL HIGH (ref 70–99)
Potassium: 4.4 mEq/L (ref 3.5–5.1)
Sodium: 142 mEq/L (ref 135–145)
Total Bilirubin: 1.1 mg/dL (ref 0.2–1.2)
Total Protein: 6.1 g/dL (ref 6.0–8.3)

## 2018-10-28 LAB — BRAIN NATRIURETIC PEPTIDE: Pro B Natriuretic peptide (BNP): 67 pg/mL (ref 0.0–100.0)

## 2018-10-28 NOTE — Assessment & Plan Note (Addendum)
Concern for acute diastolic CHF exacerbation given increased dyspnea, weight gain, pedal edema. Check labwork (CBC, BNP, CMP) and increase lasix to 80mg  bid x 3 days. Update with effect. Advised seek urgent care if worsening symptoms despite treatment. Pt and wife agree with treatment plan.

## 2018-10-28 NOTE — Assessment & Plan Note (Addendum)
Large L kidney stone, recently failed percutaneous treatment due to bleeding, planned chemolysis through urology.

## 2018-10-28 NOTE — Progress Notes (Signed)
This visit was conducted in person.  BP (!) 122/50 (BP Location: Right Arm, Patient Position: Sitting, Cuff Size: Large)   Pulse (!) 58   Temp 98 F (36.7 C) (Temporal)   Ht 5' 5.5" (1.664 m)   Wt 287 lb 3 oz (130.3 kg)   SpO2 95%   BMI 47.06 kg/m    CC: leg swelling weight gain and dyspnea Subjective:    Patient ID: Donald [name], male    DOB: 1945-06-01, 73 y.o.   MRN: [id number]  HPI: Donald [name] is a 73 y.o. male presenting on 10/28/2018 for Shortness of Breath (C/o SOB on exertion.  Started a few weeks ago. Told by cardiology to increase amlodipine.  Pt increased to 10 mg (2- 5 mg tabs).  Sxs started after increasing dose.  Has also had some nausea, wt increase, fatigue and leg swelling. ) and Constipation (C/o contstipaiton about 2 wks. )   See yesterday's phone note for details. He did not go to ER for evaluation last night. Saw cardiology last month - amlodipine was increased to 5mg  daily due to elevated blood pressures. Pt states he has been taking 10mg  amlodipine since last month - never had 2.5mg  tablet dose at home.   Noted weight gain associated with worsening pedal edema despite decreased appetite, fatigue, weakness, nausea over the last several weeks. No orthopnea. Continues using CPAP to sleep. Some dyspnea with exertion. Worsening bilateral hip pain. Uses cane to walk. He attributes symptoms to when he started taking 10mg  amlodipine. Wife worried that top of his mouth isn't moving like normal. No slurred speech but he's talking slower than usual. No unilateral numbness/weakness noted. No dizziness. More constipated recently despite daily dulcolax and 2 OTC stool softener/stimulants nightly, no abdominal pain. Passing gas ok.   ESI rescheduled for next Tuesday. Now taking hydrocodone in place of oxycodone (was on this for kidney stones).   Recent hospitalization 10/5-08/2018 for large L renal calculus percutaneous management attempt - was unable to have stone  removed due to significant bleeding. Planned chemolysis instead.      Relevant past medical, surgical, family and social history reviewed and updated as indicated. Interim medical history since our last visit reviewed. Allergies and medications reviewed and updated. Outpatient Medications Prior to Visit  Medication Sig Dispense Refill  . allopurinol (ZYLOPRIM) 300 MG tablet TAKE ONE TABLET BY MOUTH EVERY DAY (Patient taking differently: Take 300 mg by mouth at bedtime. ) 30 tablet 5  . ALPRAZolam (XANAX) 1 MG tablet TAKE ONE TABLET BY MOUTH AT BEDTIME AS NEEDED FOR SLEEP (Patient taking differently: Take 0.5 mg by mouth at bedtime. ) 30 tablet 0  . amLODipine (NORVASC) 5 MG tablet Take 1 tablet (5 mg total) by mouth daily. (Patient taking differently: Take 5 mg by mouth daily. Takes 2 tablets daily) 90 tablet 3  . cephALEXin (KEFLEX) 500 MG capsule Take 1 capsule (500 mg total) by mouth 2 (two) times daily. 6 capsule 0  . colchicine (COLCRYS) 0.6 MG tablet TAKE 1 TABLET BY MOUTH EVERY HOUR UNTIL SYMPTOMS ARE BETTER, MAX OF 6TABS PER DAY. STOP IF DIARRHEA (Patient taking differently: Take 0.6 mg by mouth daily as needed (gout flare ups.). MAX OF 6TABS PER DAY. STOP IF DIARRHEA) 30 tablet 4  . cyanocobalamin (,VITAMIN B-12,) 1000 MCG/ML injection Inject 1,000 mcg into the muscle every 30 (thirty) days.    . fluticasone (FLONASE) 50 MCG/ACT nasal spray Place 2 sprays into both nostrils daily. (Patient taking differently:  Place 2 sprays into both nostrils daily as needed (allergies.). ) 16 g 1  . furosemide (LASIX) 80 MG tablet Take 1 tablet (80 mg total) by mouth daily. 90 tablet 3  . gabapentin (NEURONTIN) 600 MG tablet TAKE TWO TABLETS (1200MG ) BY MOUTH AT BEDTIME (Patient taking differently: Take 1,200 mg by mouth at bedtime. ) 180 tablet 3  . GE100 BLOOD GLUCOSE TEST test strip TEST FOUR TIMES DAILY 200 each 1  . HYDROcodone-acetaminophen (NORCO) 10-325 MG tablet Take 1 tablet by mouth every 6  (six) hours as needed for moderate pain ((typically once to twice daily if needed)).  30 tablet 0  . insulin NPH Human (HUMULIN N) 100 UNIT/ML injection Inject 2.9 mLs (290 Units total) into the skin at bedtime. 90 mL 5  . insulin regular (NOVOLIN R RELION) 100 units/mL injection INJECT 210 UNITS INTO THE SKIN BEFORE BREAKFAST, INJECT 170 UNITS BEFORE LUNCH, AND INJECT 270 UNITS BEFORE DINNER. (JUST BEFORE EACH MEAL) (Patient taking differently: Inject 200-270 Units into the skin See admin instructions. INJECT 210 UNITS INTO THE SKIN BEFORE BREAKFAST(OFTEN SKIPS BREAKFAST) , INJECT 200 UNITS BEFORE LUNCH, AND INJECT 270 UNITS BEFORE DINNER. (JUST BEFORE EACH MEAL)) 210 mL 0  . losartan (COZAAR) 100 MG tablet TAKE ONE TABLET BY MOUTH AT BEDTIME (Patient taking differently: Take 100 mg by mouth at bedtime. ) 90 tablet 3  . lovastatin (MEVACOR) 40 MG tablet TAKE 2 TABLETS BY MOUTH (80MG  TOTAL) AT BEDTIME (Patient taking differently: Take 80 mg by mouth at bedtime. ) 180 tablet 3  . Naphazoline-Pheniramine (OPCON-A OP) Place 1 drop into both eyes 3 (three) times daily as needed (itchy eyes.).     Marland Kitchen ondansetron (ZOFRAN) 4 MG tablet Take 1 tablet (4 mg total) by mouth every 4 (four) hours as needed for nausea. 10 tablet 0  . Sennosides-Docusate Sodium (STOOL SOFTENER & LAXATIVE PO) Take 2 tablets by mouth at bedtime.     No facility-administered medications prior to visit.      Per HPI unless specifically indicated in ROS section below Review of Systems Objective:    BP (!) 122/50 (BP Location: Right Arm, Patient Position: Sitting, Cuff Size: Large)   Pulse (!) 58   Temp 98 F (36.7 C) (Temporal)   Ht 5' 5.5" (1.664 m)   Wt 287 lb 3 oz (130.3 kg)   SpO2 95%   BMI 47.06 kg/m   Wt Readings from Last 3 Encounters:  10/28/18 287 lb 3 oz (130.3 kg)  10/19/18 279 lb 15.8 oz (127 kg)  10/15/18 284 lb 1 oz (128.8 kg)    Physical Exam Vitals signs and nursing note reviewed.  Constitutional:       General: He is not in acute distress.    Appearance: He is well-developed. He is obese. He is not ill-appearing (chronically ill, fatigued appearing).  HENT:     Mouth/Throat:     Mouth: Mucous membranes are dry.     Pharynx: Oropharynx is clear. No posterior oropharyngeal erythema.  Eyes:     Extraocular Movements: Extraocular movements intact.     Pupils: Pupils are equal, round, and reactive to light.  Cardiovascular:     Rate and Rhythm: Normal rate and regular rhythm.     Pulses: Normal pulses.     Heart sounds: Murmur (2/6 systolic) present.  Pulmonary:     Effort: Pulmonary effort is normal. No respiratory distress.     Breath sounds: No wheezing, rhonchi or rales.  Comments: Coarse bibasilar crackles Musculoskeletal:     Right lower leg: Edema (1+) present.     Left lower leg: Edema (1+) present.  Skin:    Findings: No erythema or rash.  Neurological:     General: No focal deficit present.     Mental Status: He is alert.     Comments:  CN 2-12 intact FTN intact Sensation intact 5/5 strength BUE, BLE  Psychiatric:        Mood and Affect: Mood normal.       Lab Results  Component Value Date   CREATININE 1.54 (H) 10/19/2018   BUN 33 (H) 10/19/2018   NA 143 10/19/2018   K 3.6 10/19/2018   CL 103 10/19/2018   CO2 27 10/19/2018    Lab Results  Component Value Date   WBC 8.2 10/19/2018   HGB 10.3 (L) 10/21/2018   HCT 32.0 (L) 10/21/2018   MCV 89.4 10/19/2018   PLT 186 10/19/2018    Lab Results  Component Value Date   HGBA1C 7.9 (H) 10/15/2018    Assessment & Plan:   Problem List Items Addressed This Visit    Uncontrolled type 2 diabetes mellitus with peripheral neuropathy ([place])   Type 2 diabetes mellitus with diabetic chronic kidney disease (H[facility])   Obesity, morbid, BMI 40.0-49.9 ([place])   Kidney stone    Large L kidney stone, recently failed percutaneous treatment due to bleeding, planned chemolysis through urology.       Heart failure with  preserved ejection fraction ([place]) - Primary    Concern for acute diastolic CHF exacerbation given increased dyspnea, weight gain, pedal edema. Check labwork (CBC, BNP, CMP) and increase lasix to 80mg  bid x 3 days. Update with effect. Advised seek urgent care if worsening symptoms despite treatment. Pt and wife agree with treatment plan.       Relevant Orders   Comprehensive metabolic panel   CBC with Differential/Platelet   Brain natriuretic peptide   Essential hypertension    BP better controlled today - did recommend he drop amlodipine to 5mg  daily (as recommended by cardiology).       Constipation    Worsening - rec start miralax PRN while on daily narcotic (hydrocodone)      CKD stage 3 due to type 2 diabetes mellitus ([place])    Update labs, planned doubling lasix dose for next 3 days.        Other Visit Diagnoses    Shortness of breath           No orders of the defined types were placed in this encounter.  Orders Placed This Encounter  Procedures  . Comprehensive metabolic panel  . CBC with Differential/Platelet  . Brain natriuretic peptide    Patient Instructions  Possible CHF exacerbation - continue lasix 80mg  twice daily for next 3 days.  Decrease amlodipine back to 5mg  daily.  Labs today. We will be in touch with results.   Follow up plan: Return if symptoms worsen or fail to improve.  Ria Bush, MD

## 2018-10-28 NOTE — Assessment & Plan Note (Addendum)
Update labs, planned doubling lasix dose for next 3 days.

## 2018-10-28 NOTE — Assessment & Plan Note (Signed)
Worsening - rec start miralax PRN while on daily narcotic (hydrocodone)

## 2018-10-28 NOTE — Assessment & Plan Note (Signed)
BP better controlled today - did recommend he drop amlodipine to 5mg  daily (as recommended by cardiology).

## 2018-10-28 NOTE — Patient Instructions (Signed)
Possible CHF exacerbation - continue lasix 80mg  twice daily for next 3 days.  Decrease amlodipine back to 5mg  daily.  Labs today. We will be in touch with results.

## 2018-11-02 ENCOUNTER — Telehealth: Payer: Self-pay | Admitting: *Deleted

## 2018-11-02 NOTE — Telephone Encounter (Signed)
Patient's wife left a voicemail stating that patient was seen last Wednesday and his weight was 287. Patient's wife stated that now his weight is 269.7 and he did have a lot of fluid. . Mrs. Conard stated that he does have a lot of chest congestion. Patient's wife wants to know if the weight loss is okay?

## 2018-11-02 NOTE — Telephone Encounter (Signed)
Spoke with pt/ pt's wife, Lannette Donath.  Labs scheduled on 11/04/18 at 2:45. Says pt feels nauseous.  Systolic BP has been in upper 140s low 230O,  Diastolic in 97V.  Scheduled OV on 11/04/18 at 3:00.  Fyi to Dr. Darnell Level.

## 2018-11-02 NOTE — Telephone Encounter (Signed)
See below ensure this was done.  No lab scheduled yet for this week. Need to recheck kidney function after cutting lasix and losartan in half.  How is he feeling? How are BP readings? If still not feeling well, would offer rpt OV.   ------------------------------------------------------------------------ Notes recorded by Brenton Grills, [place] on 80/99/8338 at 5:23 PM EDT  Pt notified as instructed by phone. Verbalizes understanding. Says he will need to call back tomorrow to schedule lab visit for next week.  ------   Notes recorded by Ria Bush, MD on 10/28/2018 at 5:08 PM EDT  Please call - kidney function is acutely worse since checked 2 wks ago. Recommend stop extra lasix dose, actually cut lasix in half over the next 3 days as well as losartan in half over next 3 days. Try to increase water intake by 1-2 8oz glasses a day to improve hydration status.  Sugar, liver ok. Anemia persists but stable from last week's check.  Schedule rpt labs early next week to recheck kidney function to ensure improving.  If worsening symptoms, seek urgent care.

## 2018-11-03 ENCOUNTER — Ambulatory Visit: Payer: Medicare Other | Admitting: Endocrinology

## 2018-11-03 ENCOUNTER — Telehealth: Payer: Self-pay | Admitting: Cardiovascular Disease

## 2018-11-03 NOTE — Telephone Encounter (Signed)
Attempted to schedule January fu .  Patient sleeping and wants to call back later

## 2018-11-04 ENCOUNTER — Ambulatory Visit: Payer: Medicare Other | Admitting: Endocrinology

## 2018-11-04 ENCOUNTER — Encounter: Payer: Self-pay | Admitting: Family Medicine

## 2018-11-04 ENCOUNTER — Other Ambulatory Visit: Payer: Medicare Other

## 2018-11-04 ENCOUNTER — Other Ambulatory Visit: Payer: Self-pay

## 2018-11-04 ENCOUNTER — Ambulatory Visit (INDEPENDENT_AMBULATORY_CARE_PROVIDER_SITE_OTHER): Payer: Medicare Other | Admitting: Family Medicine

## 2018-11-04 VITALS — BP 142/52 | HR 56 | Temp 97.8°F | Ht 65.5 in | Wt 270.2 lb

## 2018-11-04 DIAGNOSIS — D509 Iron deficiency anemia, unspecified: Secondary | ICD-10-CM

## 2018-11-04 DIAGNOSIS — M1A9XX0 Chronic gout, unspecified, without tophus (tophi): Secondary | ICD-10-CM

## 2018-11-04 DIAGNOSIS — N200 Calculus of kidney: Secondary | ICD-10-CM

## 2018-11-04 DIAGNOSIS — N189 Chronic kidney disease, unspecified: Secondary | ICD-10-CM

## 2018-11-04 DIAGNOSIS — N183 Chronic kidney disease, stage 3 unspecified: Secondary | ICD-10-CM

## 2018-11-04 DIAGNOSIS — I2510 Atherosclerotic heart disease of native coronary artery without angina pectoris: Secondary | ICD-10-CM | POA: Diagnosis not present

## 2018-11-04 DIAGNOSIS — N289 Disorder of kidney and ureter, unspecified: Secondary | ICD-10-CM | POA: Diagnosis not present

## 2018-11-04 DIAGNOSIS — E1122 Type 2 diabetes mellitus with diabetic chronic kidney disease: Secondary | ICD-10-CM

## 2018-11-04 MED ORDER — FERROUS SULFATE 324 (65 FE) MG PO TBEC: 1.0000 | DELAYED_RELEASE_TABLET | Freq: Every day | ORAL | Status: DC

## 2018-11-04 NOTE — Assessment & Plan Note (Addendum)
Recent ARF now improving. This could explain recent nausea, malaise. Update renal panel today. He did cut lasix and losartan 1/2 dose for 3 days.

## 2018-11-04 NOTE — Progress Notes (Signed)
This visit was conducted in person.  BP (!) 142/52 (BP Location: Right Arm, Patient Position: Sitting, Cuff Size: Large)   Pulse (!) 56   Temp 97.8 F (36.6 C) (Temporal)   Ht 5' 5.5" (1.664 m)   Wt 270 lb 3 oz (122.6 kg)   SpO2 96%   BMI 44.28 kg/m    CC: f/u visit Subjective:    Patient ID: Donald [name], male    DOB: 12-Oct-1945, 73 y.o.   MRN: [id number]  HPI: Donald [name] is a 73 y.o. male presenting on 11/04/2018 for Nausea (C/o nausea and fatigue. )   See prior note for details.   Seen here last week with weight gain associated with worsening pedal edema. labwork showed acute on chronic renal insufficiency with Cr up to 2.5 (baseline ~1.5). We cut lasix and losartan in half for 3 days. He noticed significant diuresis despite this - 18 lbs down since last week.   Ongoing fatigue, nausea despite zofran. Last night was his first meal he could tolerate, able to eat lunch today. Weight loss noted. Denies significant L flank or abd pain.   Recent hospitalization 10/5-08/2018 for large L renal calculus percutaneous management attempt - was unable to have stone removed due to significant bleeding. Planned chemolysis instead. Hgb 13.7 --> 12.5 --> 10.2 during hospitalization, stable on repeat last week. Currently off iron for months.   Upcoming appt with urology ([place]) next Thursday.   By the way - several week h/o L lateral foot pain - even hurts to have sheet rub over foot. Affecting ability to sleep. Improved with asper cream and sock last night. Regularly takes allopurinol 300mg  daily as well as has been taking colchicine 0.6mg  1-2 tab daily.      Relevant past medical, surgical, family and social history reviewed and updated as indicated. Interim medical history since our last visit reviewed. Allergies and medications reviewed and updated. Outpatient Medications Prior to Visit  Medication Sig Dispense Refill  . allopurinol (ZYLOPRIM) 300 MG tablet TAKE ONE  TABLET BY MOUTH EVERY DAY (Patient taking differently: Take 300 mg by mouth at bedtime. ) 30 tablet 5  . ALPRAZolam (XANAX) 1 MG tablet TAKE ONE TABLET BY MOUTH AT BEDTIME AS NEEDED FOR SLEEP (Patient taking differently: Take 0.5 mg by mouth at bedtime. ) 30 tablet 0  . amLODipine (NORVASC) 5 MG tablet Take 1 tablet (5 mg total) by mouth daily. (Patient taking differently: Take 5 mg by mouth daily. Takes 2 tablets daily) 90 tablet 3  . cephALEXin (KEFLEX) 500 MG capsule Take 1 capsule (500 mg total) by mouth 2 (two) times daily. 6 capsule 0  . colchicine (COLCRYS) 0.6 MG tablet TAKE 1 TABLET BY MOUTH EVERY HOUR UNTIL SYMPTOMS ARE BETTER, MAX OF 6TABS PER DAY. STOP IF DIARRHEA (Patient taking differently: Take 0.6 mg by mouth daily as needed (gout flare ups.). MAX OF 6TABS PER DAY. STOP IF DIARRHEA) 30 tablet 4  . cyanocobalamin (,VITAMIN B-12,) 1000 MCG/ML injection Inject 1,000 mcg into the muscle every 30 (thirty) days.    . fluticasone (FLONASE) 50 MCG/ACT nasal spray Place 2 sprays into both nostrils daily. (Patient taking differently: Place 2 sprays into both nostrils daily as needed (allergies.). ) 16 g 1  . furosemide (LASIX) 80 MG tablet Take 1 tablet (80 mg total) by mouth daily. 90 tablet 3  . gabapentin (NEURONTIN) 600 MG tablet TAKE TWO TABLETS (1200MG ) BY MOUTH AT BEDTIME (Patient taking differently: Take 1,200 mg by  mouth at bedtime. ) 180 tablet 3  . GE100 BLOOD GLUCOSE TEST test strip TEST FOUR TIMES DAILY 200 each 1  . HYDROcodone-acetaminophen (NORCO) 10-325 MG tablet Take 1 tablet by mouth every 6 (six) hours as needed for moderate pain ((typically once to twice daily if needed)).  30 tablet 0  . insulin NPH Human (HUMULIN N) 100 UNIT/ML injection Inject 2.9 mLs (290 Units total) into the skin at bedtime. 90 mL 5  . insulin regular (NOVOLIN R RELION) 100 units/mL injection INJECT 210 UNITS INTO THE SKIN BEFORE BREAKFAST, INJECT 170 UNITS BEFORE LUNCH, AND INJECT 270 UNITS BEFORE  DINNER. (JUST BEFORE EACH MEAL) (Patient taking differently: Inject 200-270 Units into the skin See admin instructions. INJECT 210 UNITS INTO THE SKIN BEFORE BREAKFAST(OFTEN SKIPS BREAKFAST) , INJECT 200 UNITS BEFORE LUNCH, AND INJECT 270 UNITS BEFORE DINNER. (JUST BEFORE EACH MEAL)) 210 mL 0  . losartan (COZAAR) 100 MG tablet TAKE ONE TABLET BY MOUTH AT BEDTIME (Patient taking differently: Take 100 mg by mouth at bedtime. ) 90 tablet 3  . lovastatin (MEVACOR) 40 MG tablet TAKE 2 TABLETS BY MOUTH (80MG  TOTAL) AT BEDTIME (Patient taking differently: Take 80 mg by mouth at bedtime. ) 180 tablet 3  . Naphazoline-Pheniramine (OPCON-A OP) Place 1 drop into both eyes 3 (three) times daily as needed (itchy eyes.).     Marland Kitchen ondansetron (ZOFRAN) 4 MG tablet Take 1 tablet (4 mg total) by mouth every 4 (four) hours as needed for nausea. 10 tablet 0  . Sennosides-Docusate Sodium (STOOL SOFTENER & LAXATIVE PO) Take 2 tablets by mouth at bedtime.     No facility-administered medications prior to visit.      Per HPI unless specifically indicated in ROS section below Review of Systems Objective:    BP (!) 142/52 (BP Location: Right Arm, Patient Position: Sitting, Cuff Size: Large)   Pulse (!) 56   Temp 97.8 F (36.6 C) (Temporal)   Ht 5' 5.5" (1.664 m)   Wt 270 lb 3 oz (122.6 kg)   SpO2 96%   BMI 44.28 kg/m   Wt Readings from Last 3 Encounters:  11/04/18 270 lb 3 oz (122.6 kg)  10/28/18 287 lb 3 oz (130.3 kg)  10/19/18 279 lb 15.8 oz (127 kg)    Physical Exam Vitals signs and nursing note reviewed.  Constitutional:      General: He is not in acute distress.    Appearance: Normal appearance. He is obese. He is not ill-appearing.  HENT:     Mouth/Throat:     Mouth: Mucous membranes are moist.     Pharynx: Oropharynx is clear. No posterior oropharyngeal erythema.  Cardiovascular:     Rate and Rhythm: Normal rate and regular rhythm.     Pulses: Normal pulses.     Heart sounds: Normal heart sounds.  No murmur.  Pulmonary:     Effort: Pulmonary effort is normal. No respiratory distress.     Breath sounds: Normal breath sounds. No wheezing or rhonchi.  Chest:     Chest wall: No tenderness.  Abdominal:     General: Bowel sounds are normal.     Palpations: Abdomen is soft.     Tenderness: There is no abdominal tenderness. There is no guarding or rebound.  Musculoskeletal:     Right lower leg: No edema.     Left lower leg: No edema.     Comments: Mild redness lateral 5th MTJP on left without reproducible pain to palpation  Skin:  Findings: No rash.  Neurological:     Mental Status: He is alert.  Psychiatric:        Mood and Affect: Mood normal.        Behavior: Behavior normal.       Results for orders placed or performed in visit on 10/28/18  Comprehensive metabolic panel  Result Value Ref Range   Sodium 142 135 - 145 mEq/L   Potassium 4.4 3.5 - 5.1 mEq/L   Chloride 102 96 - 112 mEq/L   CO2 31 19 - 32 mEq/L   Glucose, Bld 132 (H) 70 - 99 mg/dL   BUN 62 (H) 6 - 23 mg/dL   Creatinine, Ser 2.51 (H) 0.40 - 1.50 mg/dL   Total Bilirubin 1.1 0.2 - 1.2 mg/dL   Alkaline Phosphatase 80 39 - 117 U/L   AST 10 0 - 37 U/L   ALT 13 0 - 53 U/L   Total Protein 6.1 6.0 - 8.3 g/dL   Albumin 3.6 3.5 - 5.2 g/dL   Calcium 8.9 8.4 - 10.5 mg/dL   GFR 25.48 (L) >60.00 mL/min  CBC with Differential/Platelet  Result Value Ref Range   WBC 8.1 4.0 - 10.5 K/uL   RBC 3.48 (L) 4.22 - 5.81 Mil/uL   Hemoglobin 10.2 (L) 13.0 - 17.0 g/dL   HCT 30.8 (L) 39.0 - 52.0 %   MCV 88.3 78.0 - 100.0 fl   MCHC 33.0 30.0 - 36.0 g/dL   RDW 16.6 (H) 11.5 - 15.5 %   Platelets 220.0 150.0 - 400.0 K/uL   Neutrophils Relative % 71.0 43.0 - 77.0 %   Lymphocytes Relative 15.6 12.0 - 46.0 %   Monocytes Relative 7.3 3.0 - 12.0 %   Eosinophils Relative 5.4 (H) 0.0 - 5.0 %   Basophils Relative 0.7 0.0 - 3.0 %   Neutro Abs 5.8 1.4 - 7.7 K/uL   Lymphs Abs 1.3 0.7 - 4.0 K/uL   Monocytes Absolute 0.6 0.1 - 1.0 K/uL    Eosinophils Absolute 0.4 0.0 - 0.7 K/uL   Basophils Absolute 0.1 0.0 - 0.1 K/uL  Brain natriuretic peptide  Result Value Ref Range   Pro B Natriuretic peptide (BNP) 67.0 0.0 - 100.0 pg/mL   Lab Results  Component Value Date   LABURIC 5.7 03/18/2018    Assessment & Plan:   Problem List Items Addressed This Visit    Kidney stone   Iron deficiency anemia    New blood loss anemia - rec restart iron supplement daily.       Relevant Medications   ferrous sulfate 324 (65 Fe) MG TBEC   CKD stage 3 due to type 2 diabetes mellitus ([place])   Relevant Orders   Renal function panel   Chronic gout    Several weeks of L foot pain without significant redness, warmth. Possible gout flare - check urate. He does continue allopurinol 300mg  daily with colchicine PRN - which is not currently effective.       Relevant Orders   Uric acid   Acute on chronic renal insufficiency - Primary    Recent ARF now improving. This could explain recent nausea, malaise. Update renal panel today. He did cut lasix and losartan 1/2 dose for 3 days.       Relevant Orders   Renal function panel       Meds ordered this encounter  Medications  . ferrous sulfate 324 (65 Fe) MG TBEC    Sig: Take 1 tablet (325 mg total)  by mouth daily.    Dispense:  30 tablet   Orders Placed This Encounter  Procedures  . Renal function panel  . Uric acid    Patient Instructions  Labs today.  Restart iron tablet daily (ferrous sulfate 325mg  daily).  Continue stool softener and miralax. I'm glad you're feeling some better.    Follow up plan: No follow-ups on file.  Ria Bush, MD

## 2018-11-04 NOTE — Assessment & Plan Note (Signed)
Several weeks of L foot pain without significant redness, warmth. Possible gout flare - check urate. He does continue allopurinol 300mg  daily with colchicine PRN - which is not currently effective.

## 2018-11-04 NOTE — Patient Instructions (Signed)
Labs today.  Restart iron tablet daily (ferrous sulfate 325mg  daily).  Continue stool softener and miralax. I'm glad you're feeling some better.

## 2018-11-04 NOTE — Assessment & Plan Note (Signed)
New blood loss anemia - rec restart iron supplement daily.

## 2018-11-05 LAB — RENAL FUNCTION PANEL
Albumin: 4 g/dL (ref 3.5–5.2)
BUN: 25 mg/dL — ABNORMAL HIGH (ref 6–23)
CO2: 31 mEq/L (ref 19–32)
Calcium: 9.1 mg/dL (ref 8.4–10.5)
Chloride: 104 mEq/L (ref 96–112)
Creatinine, Ser: 1.94 mg/dL — ABNORMAL HIGH (ref 0.40–1.50)
GFR: 34.3 mL/min — ABNORMAL LOW (ref >60.00)
Glucose, Bld: 160 mg/dL — ABNORMAL HIGH (ref 70–99)
Phosphorus: 3.2 mg/dL (ref 2.3–4.6)
Potassium: 4.6 mEq/L (ref 3.5–5.1)
Sodium: 143 mEq/L (ref 135–145)

## 2018-11-05 LAB — URIC ACID: Uric Acid, Serum: 5 mg/dL (ref 4.0–7.8)

## 2018-11-10 ENCOUNTER — Other Ambulatory Visit: Payer: Self-pay

## 2018-11-10 ENCOUNTER — Encounter: Payer: Self-pay | Admitting: Endocrinology

## 2018-11-10 ENCOUNTER — Ambulatory Visit (INDEPENDENT_AMBULATORY_CARE_PROVIDER_SITE_OTHER): Payer: Medicare Other | Admitting: Endocrinology

## 2018-11-10 VITALS — BP 138/60 | HR 64 | Ht 65.5 in | Wt 272.8 lb

## 2018-11-10 DIAGNOSIS — E1121 Type 2 diabetes mellitus with diabetic nephropathy: Secondary | ICD-10-CM | POA: Diagnosis not present

## 2018-11-10 DIAGNOSIS — N1832 Chronic kidney disease, stage 3b: Secondary | ICD-10-CM | POA: Diagnosis not present

## 2018-11-10 DIAGNOSIS — E1122 Type 2 diabetes mellitus with diabetic chronic kidney disease: Secondary | ICD-10-CM

## 2018-11-10 DIAGNOSIS — I2510 Atherosclerotic heart disease of native coronary artery without angina pectoris: Secondary | ICD-10-CM

## 2018-11-10 DIAGNOSIS — Z794 Long term (current) use of insulin: Secondary | ICD-10-CM | POA: Diagnosis not present

## 2018-11-10 DIAGNOSIS — Z79891 Long term (current) use of opiate analgesic: Secondary | ICD-10-CM | POA: Diagnosis not present

## 2018-11-10 MED ORDER — INSULIN REGULAR HUMAN 100 UNIT/ML IJ SOLN: INTRAMUSCULAR | 0 refills | Status: DC

## 2018-11-10 NOTE — Patient Instructions (Addendum)
Please increase the insulin to the numbers listed below.   check your blood sugar twice a day.  vary the time of day when you check, between before the 3 meals, and at bedtime.  also check if you have symptoms of your blood sugar being too high or too low.  please keep a record of the readings and bring it to your next appointment here (or you can bring the meter itself).  You can write it on any piece of paper.  please call us sooner if your blood sugar goes below 70, or if you have a lot of readings over 200.  Please come back for a follow-up appointment in 2-3 months.

## 2018-11-10 NOTE — Progress Notes (Signed)
Subjective:    Patient ID: Donald [name], male    DOB: March 23, 1945, 73 y.o.   MRN: [id number]  HPI Pt returns for f/u of diabetes mellitus: DM type: Insulin-requiring type 2 Dx'ed: 7408 Complications: polyneuropathy, CAD and renal insufficiency.   Therapy: insulin since 1995 DKA: never.  Severe hypoglycemia: never.  Pancreatitis: never.  Other: he takes multiple daily injections; he takes human insulin, due to cost; he declines weight-loss surgery; he has severe insulin resistance; he declines pump; He often eats 2 meals per day, so he takes only 2 injections of novolog.  Interval history: no recent steroids.  he says cbg varies from 68-246.  It is in general lowest in the afternoon, and highest at HS.  He has mild hypoglycemia approx once per month.  Past Medical History:  Diagnosis Date  . (HFpEF) heart failure with preserved ejection fraction ([place])    a. 09/2018 Echo: EF 60-65%. PASP 12mmHg. Mild-mod LAE. Mild MR/TR.  Marland Kitchen ANEMIA-IRON DEFICIENCY 07/26/2006  . Anxiety   . ASTHMA 07/26/2006  . Asthma   . Back pain, chronic   . Bell's palsy   . CKD (chronic kidney disease), stage III   . COLONIC POLYPS, HX OF 07/26/2006  . DEPRESSION 03/14/2009  . DIABETES MELLITUS, TYPE II 07/26/2006  . [place] DISEASE, LUMBAR 10/05/2007  . DVT 12/03/2007  . DYSLIPIDEMIA 04/13/2009  . Dyspnea    when gets up and walks around and back is hurting really bad -only Shortness of breath  then  . GERD 07/26/2006  . Gout   . Heart murmur   . History of kidney stones   . HYPERTENSION 07/26/2006  . INSOMNIA 08/21/2007  . Neuropathy   . Nonobstructive CAD (coronary artery disease)    a. 2012 Cath: no high grade stenosis; b. 2018 MV: No ischemia. Attenuation artifact.   . OBSTRUCTIVE SLEEP APNEA 12/03/2007   Use C-PAP  . PERIPHERAL NEUROPATHY 07/26/2006  . Pernicious anemia 11/20/2006  . PULMONARY EMBOLISM 10/05/2007    Past Surgical History:  Procedure Laterality Date  . APPENDECTOMY  1968  . [place], 06/14/2008   Dr. Trenton [facility] at [facility] (06/10)  . CARDIAC CATHETERIZATION    . CARPAL TUNNEL RELEASE Right 10/2017  . CATARACT EXTRACTION W/PHACO Left 05/30/2014   Procedure: CATARACT EXTRACTION PHACO AND INTRAOCULAR LENS PLACEMENT (IOC);  Surgeon: Estill Cotta, MD;  Location: [facility] ORS;  Service: Ophthalmology;  Laterality: Left;  [name] 01:20 AP% 23.7 CDE 31.86  . COLONOSCOPY  10/2002   HP, SSA, TA, rpt 3 yrs ([place])  . COLONOSCOPY  01/2005   diverticulosis rpt 5 yrs ([place])   . IR NEPHROSTOMY PLACEMENT LEFT  10/19/2018  . IR URETERAL STENT LEFT NEW ACCESS W/O SEP NEPHROSTOMY CATH  10/19/2018  . LITHOTRIPSY     X 2  . NEPHROLITHOTOMY Left 10/19/2018   Procedure: NEPHROLITHOTOMY PERCUTANEOUS;  Surgeon: Franchot Gallo, MD;  Location: [facility] ORS;  Service: Urology;  Laterality: Left;  3 HRS  . PARATHYROIDECTOMY Right 04/18/2016   PARATHYROIDECTOMY for cyst [name], MD)  . SHOULDER ARTHROSCOPY W/ ROTATOR CUFF REPAIR Right   . TONSILLECTOMY      Social History   Socioeconomic History  . Marital status: Married    Spouse name: Not on file  . Number of children: Not on file  . Years of education: Not on file  . Highest education level: Not on file  Occupational History  . Occupation: Disabled    Employer: [employer]  Social Needs  .  Financial resource strain: Not on file  . Food insecurity    Worry: Not on file    Inability: Not on file  . Transportation needs    Medical: Not on file    Non-medical: Not on file  Tobacco Use  . Smoking status: Never Smoker  . Smokeless tobacco: Never Used  Substance and Sexual Activity  . Alcohol use: No  . Drug use: No  . Sexual activity: Not on file  Lifestyle  . Physical activity    Days per week: Not on file    Minutes per session: Not on file  . Stress: Not on file  Relationships  . Social Herbalist on phone: Not on file    Gets together: Not on file    Attends religious service: Not on file     Active member of club or organization: Not on file    Attends meetings of clubs or organizations: Not on file    Relationship status: Not on file  . Intimate partner violence    Fear of current or ex partner: Not on file    Emotionally abused: Not on file    Physically abused: Not on file    Forced sexual activity: Not on file  Other Topics Concern  . 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   Activity is severely limited by medical problems   Diet is "good".   Never a smoker   Alcohol: none          Current Outpatient Medications on File Prior to Visit  Medication Sig Dispense Refill  . allopurinol (ZYLOPRIM) 300 MG tablet TAKE ONE TABLET BY MOUTH EVERY DAY (Patient taking differently: Take 300 mg by mouth at bedtime. ) 30 tablet 5  . ALPRAZolam (XANAX) 1 MG tablet TAKE ONE TABLET BY MOUTH AT BEDTIME AS NEEDED FOR SLEEP (Patient taking differently: Take 0.5 mg by mouth at bedtime. ) 30 tablet 0  . amLODipine (NORVASC) 5 MG tablet Take 1 tablet (5 mg total) by mouth daily. (Patient taking differently: Take 5 mg by mouth daily. Takes 2 tablets daily) 90 tablet 3  . colchicine (COLCRYS) 0.6 MG tablet TAKE 1 TABLET BY MOUTH EVERY HOUR UNTIL SYMPTOMS ARE BETTER, MAX OF 6TABS PER DAY. STOP IF DIARRHEA (Patient taking differently: Take 0.6 mg by mouth daily as needed (gout flare ups.). MAX OF 6TABS PER DAY. STOP IF DIARRHEA) 30 tablet 4  . cyanocobalamin (,VITAMIN B-12,) 1000 MCG/ML injection Inject 1,000 mcg into the muscle every 30 (thirty) days.    . ferrous sulfate 324 (65 Fe) MG TBEC Take 1 tablet (325 mg total) by mouth daily. 30 tablet   . fluticasone (FLONASE) 50 MCG/ACT nasal spray Place 2 sprays into both nostrils daily. (Patient taking differently: Place 2 sprays into both nostrils daily as needed (allergies.). ) 16 g 1  . furosemide (LASIX) 80 MG tablet Take 1 tablet (80 mg total) by mouth daily. 90 tablet 3   . gabapentin (NEURONTIN) 600 MG tablet TAKE TWO TABLETS (1200MG ) BY MOUTH AT BEDTIME (Patient taking differently: Take 1,200 mg by mouth at bedtime. ) 180 tablet 3  . GE100 BLOOD GLUCOSE TEST test strip TEST FOUR TIMES DAILY 200 each 1  . HYDROcodone-acetaminophen (NORCO) 10-325 MG tablet Take 1 tablet by mouth every 6 (six) hours as needed for moderate pain ((typically once to twice daily if needed)).  30 tablet  0  . insulin NPH Human (HUMULIN N) 100 UNIT/ML injection Inject 2.9 mLs (290 Units total) into the skin at bedtime. 90 mL 5  . losartan (COZAAR) 100 MG tablet TAKE ONE TABLET BY MOUTH AT BEDTIME (Patient taking differently: Take 100 mg by mouth at bedtime. ) 90 tablet 3  . lovastatin (MEVACOR) 40 MG tablet TAKE 2 TABLETS BY MOUTH (80MG  TOTAL) AT BEDTIME (Patient taking differently: Take 80 mg by mouth at bedtime. ) 180 tablet 3  . Naphazoline-Pheniramine (OPCON-A OP) Place 1 drop into both eyes 3 (three) times daily as needed (itchy eyes.).     Marland Kitchen ondansetron (ZOFRAN) 4 MG tablet Take 1 tablet (4 mg total) by mouth every 4 (four) hours as needed for nausea. 10 tablet 0  . Sennosides-Docusate Sodium (STOOL SOFTENER & LAXATIVE PO) Take 2 tablets by mouth at bedtime.     No current facility-administered medications on file prior to visit.     Allergies  Allergen Reactions  . Codeine Other (See Comments)    REACTION: chest pain  . Pioglitazone Other (See Comments)    REACTION to Actos: swelling in ankles    Family History  Problem Relation Age of Onset  . Cancer Mother        Breast Cancer  . Cancer Sister        Breast Cancer  . Diabetes Father   . Heart disease Father   . Diabetes Paternal Grandfather     BP 138/60 (BP Location: Left Arm, Patient Position: Sitting, Cuff Size: Large)   Pulse 64   Ht 5' 5.5" (1.664 m)   Wt 272 lb 12.8 oz (123.7 kg)   SpO2 97%   BMI 44.71 kg/m    Review of Systems Denies LOC.      Objective:   Physical Exam VITAL SIGNS:  See vs page  GENERAL: no distress Pulses: dorsalis pedis intact bilat.   MSK: no deformity of the feet CV: 1+ leg edema Skin:  no ulcer on the feet.  normal color and temp on the feet. Neuro: sensation is intact to touch on the feet.    Lab Results  Component Value Date   HGBA1C 7.9 (H) 10/15/2018   Lab Results  Component Value Date   CREATININE 1.94 (H) 11/04/2018   BUN 25 (H) 11/04/2018   NA 143 11/04/2018   K 4.6 11/04/2018   CL 104 11/04/2018   CO2 31 11/04/2018      Assessment & Plan:  Insulin-requiring type 2 DM, with CAD: the pattern of his cbg's indicates he needs some adjustment in his therapy.  Hypoglycemia: this limits aggressiveness of glycemic control Renal insuff: in this setting, he needs mostly mealtime insulin.   Patient Instructions  Please increase the insulin to the numbers listed below.   check your blood sugar twice a day.  vary the time of day when you check, between before the 3 meals, and at bedtime.  also check if you have symptoms of your blood sugar being too high or too low.  please keep a record of the readings and bring it to your next appointment here (or you can bring the meter itself).  You can write it on any piece of paper.  please call us sooner if your blood sugar goes below 70, or if you have a lot of readings over 200.  Please come back for a follow-up appointment in 2-3 months.

## 2018-11-16 DIAGNOSIS — N200 Calculus of kidney: Secondary | ICD-10-CM | POA: Diagnosis not present

## 2018-11-16 DIAGNOSIS — N5201 Erectile dysfunction due to arterial insufficiency: Secondary | ICD-10-CM | POA: Diagnosis not present

## 2018-11-17 ENCOUNTER — Ambulatory Visit: Payer: Medicare Other

## 2018-11-17 ENCOUNTER — Other Ambulatory Visit: Payer: Self-pay | Admitting: Family Medicine

## 2018-11-17 DIAGNOSIS — M5136 Other intervertebral disc degeneration, lumbar region: Secondary | ICD-10-CM | POA: Diagnosis not present

## 2018-11-25 ENCOUNTER — Ambulatory Visit (INDEPENDENT_AMBULATORY_CARE_PROVIDER_SITE_OTHER): Payer: Medicare Other

## 2018-11-25 DIAGNOSIS — E538 Deficiency of other specified B group vitamins: Secondary | ICD-10-CM | POA: Diagnosis not present

## 2018-11-25 MED ORDER — CYANOCOBALAMIN 1000 MCG/ML IJ SOLN
1000.0000 ug | Freq: Once | INTRAMUSCULAR | Status: AC
  Administered 2018-11-25: 1000 ug via INTRAMUSCULAR

## 2018-11-25 NOTE — Progress Notes (Signed)
Per orders of Dr. Ria Bush, injection of B12 right deltoid given by Lurlean Nanny. Patient tolerated injection well.

## 2018-12-08 ENCOUNTER — Other Ambulatory Visit: Payer: Self-pay | Admitting: Family Medicine

## 2018-12-14 ENCOUNTER — Ambulatory Visit: Payer: Medicare Other | Admitting: Family Medicine

## 2018-12-18 ENCOUNTER — Other Ambulatory Visit: Payer: Self-pay

## 2018-12-18 ENCOUNTER — Ambulatory Visit (INDEPENDENT_AMBULATORY_CARE_PROVIDER_SITE_OTHER): Payer: Medicare Other | Admitting: Family Medicine

## 2018-12-18 ENCOUNTER — Encounter: Payer: Self-pay | Admitting: Family Medicine

## 2018-12-18 VITALS — BP 136/70 | HR 55 | Temp 98.1°F | Ht 65.5 in | Wt 274.0 lb

## 2018-12-18 DIAGNOSIS — I5032 Chronic diastolic (congestive) heart failure: Secondary | ICD-10-CM

## 2018-12-18 DIAGNOSIS — I10 Essential (primary) hypertension: Secondary | ICD-10-CM

## 2018-12-18 DIAGNOSIS — I2510 Atherosclerotic heart disease of native coronary artery without angina pectoris: Secondary | ICD-10-CM

## 2018-12-18 DIAGNOSIS — N200 Calculus of kidney: Secondary | ICD-10-CM

## 2018-12-18 DIAGNOSIS — E1122 Type 2 diabetes mellitus with diabetic chronic kidney disease: Secondary | ICD-10-CM

## 2018-12-18 DIAGNOSIS — N289 Disorder of kidney and ureter, unspecified: Secondary | ICD-10-CM | POA: Diagnosis not present

## 2018-12-18 DIAGNOSIS — N1831 Chronic kidney disease, stage 3a: Secondary | ICD-10-CM | POA: Diagnosis not present

## 2018-12-18 DIAGNOSIS — N183 Chronic kidney disease, stage 3 unspecified: Secondary | ICD-10-CM

## 2018-12-18 DIAGNOSIS — N189 Chronic kidney disease, unspecified: Secondary | ICD-10-CM

## 2018-12-18 MED ORDER — LOSARTAN POTASSIUM 25 MG PO TABS: 25.0000 mg | ORAL_TABLET | Freq: Every day | ORAL | 1 refills | Status: DC

## 2018-12-18 NOTE — Assessment & Plan Note (Signed)
He self stopped losartan and BP staying well controlled. Did recommend start at lower dose of 25mg  daily. He has labwork from renal set up today.

## 2018-12-18 NOTE — Assessment & Plan Note (Addendum)
Appreciate renal care. Has labs today at [facility] - I asked him to get me a copy to review.

## 2018-12-18 NOTE — Assessment & Plan Note (Signed)
Stable period, seems euvolemic.

## 2018-12-18 NOTE — Patient Instructions (Addendum)
Restart losartan 25mg  once daily. Continue other medicines.  I will be interested in results of today's labwork.

## 2018-12-18 NOTE — Progress Notes (Signed)
This visit was conducted in person.  BP 136/70 (BP Location: Left Arm, Patient Position: Sitting, Cuff Size: Large)   Pulse (!) 55   Temp 98.1 F (36.7 C)   Ht 5' 5.5" (1.664 m)   Wt 274 lb (124.3 kg)   SpO2 95%   BMI 44.90 kg/m    CC: 4 mo f/u visit  Subjective:    Patient ID: Donald [name], male    DOB: 1945/02/25, 73 y.o.   MRN: [id number]  HPI: Donald [name] is a 73 y.o. male presenting on 12/18/2018 for 4 Month F/U (Discuss medications. Potassium in particular. He stopped losartan because it has potssium in it.)   HTN - Compliant with current antihypertensive regimen of amlodipine 5mg  daily, lasix 80mg  daily. He self stopped losartan 100mg  as he thought it had potassium and he was recently started on K citrate TID. He stopped losartan about a month ago. Does check blood pressures at home: well controlled. No low blood pressure readings or symptoms of dizziness/syncope. Denies HA, vision changes, CP/tightness, SOB, leg swelling.   Planning labwork at [facility] for renal ([facility]). Next renal appt is early 2021.   Had epidurals last month without much benefit.     Relevant past medical, surgical, family and social history reviewed and updated as indicated. Interim medical history since our last visit reviewed. Allergies and medications reviewed and updated. Outpatient Medications Prior to Visit  Medication Sig Dispense Refill  . allopurinol (ZYLOPRIM) 300 MG tablet TAKE ONE TABLET BY MOUTH EVERY DAY (Patient taking differently: Take 300 mg by mouth at bedtime. ) 30 tablet 5  . ALPRAZolam (XANAX) 1 MG tablet TAKE ONE TABLET BY MOUTH AT BEDTIME AS NEEDED FOR SLEEP (Patient taking differently: Take 0.5 mg by mouth at bedtime. ) 30 tablet 0  . amLODipine (NORVASC) 5 MG tablet Take 1 tablet (5 mg total) by mouth daily. (Patient taking differently: Take 5 mg by mouth daily. Takes 2 tablets daily) 90 tablet 3  . colchicine (COLCRYS) 0.6 MG tablet TAKE 1 TABLET BY MOUTH EVERY  HOUR UNTIL SYMPTOMS ARE BETTER, MAX OF 6TABS PER DAY. STOP IF DIARRHEA (Patient taking differently: Take 0.6 mg by mouth daily as needed (gout flare ups.). MAX OF 6TABS PER DAY. STOP IF DIARRHEA) 30 tablet 4  . cyanocobalamin (,VITAMIN B-12,) 1000 MCG/ML injection Inject 1,000 mcg into the muscle every 30 (thirty) days.    . ferrous sulfate 324 (65 Fe) MG TBEC Take 1 tablet (325 mg total) by mouth daily. 30 tablet   . fluticasone (FLONASE) 50 MCG/ACT nasal spray Place 2 sprays into both nostrils daily. (Patient taking differently: Place 2 sprays into both nostrils daily as needed (allergies.). ) 16 g 1  . furosemide (LASIX) 80 MG tablet TAKE ONE TABLET (80 MG) BY MOUTH EVERY DAY 90 tablet 3  . gabapentin (NEURONTIN) 600 MG tablet TAKE TWO TABLETS (1200MG ) BY MOUTH AT BEDTIME (Patient taking differently: Take 1,200 mg by mouth at bedtime. ) 180 tablet 3  . GE100 BLOOD GLUCOSE TEST test strip TEST FOUR TIMES DAILY 200 each 1  . HYDROcodone-acetaminophen (NORCO) 10-325 MG tablet Take 1 tablet by mouth every 6 (six) hours as needed for moderate pain ((typically once to twice daily if needed)).  30 tablet 0  . insulin NPH Human (HUMULIN N) 100 UNIT/ML injection Inject 2.9 mLs (290 Units total) into the skin at bedtime. 90 mL 5  . insulin regular (NOVOLIN R RELION) 100 units/mL injection 3 times a day (just  before each meal), 210-150-290 units, and syringes 4/day. 210 mL 0  . lovastatin (MEVACOR) 40 MG tablet TAKE 2 TABLETS BY MOUTH (80MG  TOTAL) AT BEDTIME (Patient taking differently: Take 80 mg by mouth at bedtime. ) 180 tablet 3  . Naphazoline-Pheniramine (OPCON-A OP) Place 1 drop into both eyes 3 (three) times daily as needed (itchy eyes.).     Marland Kitchen ondansetron (ZOFRAN) 4 MG tablet Take 1 tablet (4 mg total) by mouth every 4 (four) hours as needed for nausea. 10 tablet 0  . Potassium Citrate 15 MEQ (1620 MG) TBCR Take 1 tablet by mouth 3 (three) times daily.    [name] Sodium (STOOL SOFTENER &  LAXATIVE PO) Take 2 tablets by mouth at bedtime.    Marland Kitchen losartan (COZAAR) 100 MG tablet TAKE ONE TABLET BY MOUTH AT BEDTIME (Patient not taking: No sig reported) 90 tablet 3   No facility-administered medications prior to visit.      Per HPI unless specifically indicated in ROS section below Review of Systems Objective:    BP 136/70 (BP Location: Left Arm, Patient Position: Sitting, Cuff Size: Large)   Pulse (!) 55   Temp 98.1 F (36.7 C)   Ht 5' 5.5" (1.664 m)   Wt 274 lb (124.3 kg)   SpO2 95%   BMI 44.90 kg/m   Wt Readings from Last 3 Encounters:  12/18/18 274 lb (124.3 kg)  11/10/18 272 lb 12.8 oz (123.7 kg)  11/04/18 270 lb 3 oz (122.6 kg)    Physical Exam Vitals signs and nursing note reviewed.  Constitutional:      General: He is not in acute distress.    Appearance: Normal appearance. He is obese. He is not ill-appearing.  HENT:     Mouth/Throat:     Mouth: Mucous membranes are moist.     Pharynx: Oropharynx is clear. No posterior oropharyngeal erythema.  Cardiovascular:     Rate and Rhythm: Normal rate and regular rhythm.     Pulses: Normal pulses.     Heart sounds: Murmur (3/6 systolic best USB) present.  Pulmonary:     Effort: Pulmonary effort is normal. No respiratory distress.     Breath sounds: Normal breath sounds. No wheezing, rhonchi or rales.  Musculoskeletal:     Right lower leg: No edema.     Left lower leg: No edema.  Skin:    Findings: No rash.  Neurological:     Mental Status: He is alert.  Psychiatric:        Mood and Affect: Mood normal.        Behavior: Behavior normal.       Results for orders placed or performed in visit on 11/04/18  Renal function panel  Result Value Ref Range   Sodium 143 135 - 145 mEq/L   Potassium 4.6 3.5 - 5.1 mEq/L   Chloride 104 96 - 112 mEq/L   CO2 31 19 - 32 mEq/L   Calcium 9.1 8.4 - 10.5 mg/dL   Albumin 4.0 3.5 - 5.2 g/dL   BUN 25 (H) 6 - 23 mg/dL   Creatinine, Ser 1.94 (H) 0.40 - 1.50 mg/dL   Glucose,  Bld 160 (H) 70 - 99 mg/dL   Phosphorus 3.2 2.3 - 4.6 mg/dL   GFR 34.30 (L) >60.00 mL/min  Uric acid  Result Value Ref Range   Uric Acid, Serum 5.0 4.0 - 7.8 mg/dL   Lab Results  Component Value Date   HGBA1C 7.9 (H) 10/15/2018  Assessment & Plan:  This visit occurred during the SARS-CoV-2 public health emergency.  Safety protocols were in place, including screening questions prior to the visit, additional usage of staff PPE, and extensive cleaning of exam room while observing appropriate contact time as indicated for disinfecting solutions.   Problem List Items Addressed This Visit    Kidney stone    Appreciate uro care ([place]) Managing medically with K citrate.       Heart failure with preserved ejection fraction (HCC)    Stable period, seems euvolemic.       Relevant Medications   losartan (COZAAR) 25 MG tablet   Essential hypertension - Primary    Chronic, stable only on amlodipine 5mg  and lasix 80mg  daily. He self stopped losartan - was worried of potassium overload when uro started K citrate. I advised he restart losartan 25mg  daily for kidney protection in diabetic.       Relevant Medications   losartan (COZAAR) 25 MG tablet   CKD stage 3 due to type 2 diabetes mellitus ([place])    Appreciate renal care. Has labs today at [facility] - I asked him to get me a copy to review.       Relevant Medications   losartan (COZAAR) 25 MG tablet   Acute on chronic renal insufficiency    He self stopped losartan and BP staying well controlled. Did recommend start at lower dose of 25mg  daily. He has labwork from renal set up today.           Meds ordered this encounter  Medications  . losartan (COZAAR) 25 MG tablet    Sig: Take 1 tablet (25 mg total) by mouth at bedtime.    Dispense:  90 tablet    Refill:  1    Note new dosing   No orders of the defined types were placed in this encounter.   Patient Instructions  Restart losartan 25mg  once daily. Continue other  medicines.  I will be interested in results of today's labwork.   Follow up plan: Return in about 4 months (around 04/18/2019) for medicare wellness visit.  Ria Bush, MD

## 2018-12-18 NOTE — Assessment & Plan Note (Addendum)
Appreciate uro care ([place]) Managing medically with K citrate.

## 2018-12-18 NOTE — Assessment & Plan Note (Signed)
Chronic, stable only on amlodipine 5mg  and lasix 80mg  daily. He self stopped losartan - was worried of potassium overload when uro started K citrate. I advised he restart losartan 25mg  daily for kidney protection in diabetic.

## 2019-01-06 ENCOUNTER — Ambulatory Visit (INDEPENDENT_AMBULATORY_CARE_PROVIDER_SITE_OTHER): Payer: Medicare Other

## 2019-01-06 ENCOUNTER — Other Ambulatory Visit: Payer: Self-pay

## 2019-01-06 DIAGNOSIS — E538 Deficiency of other specified B group vitamins: Secondary | ICD-10-CM

## 2019-01-06 MED ORDER — CYANOCOBALAMIN 1000 MCG/ML IJ SOLN
1000.0000 ug | Freq: Once | INTRAMUSCULAR | Status: AC
  Administered 2019-01-06: 1000 ug via INTRAMUSCULAR

## 2019-01-06 NOTE — Progress Notes (Signed)
Pt given monthly B12 injection in Left Deltoid. Tolerated well.

## 2019-02-03 DIAGNOSIS — N189 Chronic kidney disease, unspecified: Secondary | ICD-10-CM | POA: Diagnosis not present

## 2019-02-03 DIAGNOSIS — N1831 Chronic kidney disease, stage 3a: Secondary | ICD-10-CM | POA: Diagnosis not present

## 2019-02-03 DIAGNOSIS — M109 Gout, unspecified: Secondary | ICD-10-CM | POA: Diagnosis not present

## 2019-02-08 ENCOUNTER — Other Ambulatory Visit: Payer: Self-pay

## 2019-02-10 ENCOUNTER — Ambulatory Visit (INDEPENDENT_AMBULATORY_CARE_PROVIDER_SITE_OTHER): Payer: Medicare Other | Admitting: Endocrinology

## 2019-02-10 ENCOUNTER — Encounter: Payer: Self-pay | Admitting: Endocrinology

## 2019-02-10 ENCOUNTER — Other Ambulatory Visit: Payer: Self-pay

## 2019-02-10 VITALS — BP 130/60 | HR 65 | Ht 65.5 in | Wt 283.2 lb

## 2019-02-10 DIAGNOSIS — Z794 Long term (current) use of insulin: Secondary | ICD-10-CM

## 2019-02-10 DIAGNOSIS — N1832 Chronic kidney disease, stage 3b: Secondary | ICD-10-CM | POA: Diagnosis not present

## 2019-02-10 DIAGNOSIS — E1121 Type 2 diabetes mellitus with diabetic nephropathy: Secondary | ICD-10-CM

## 2019-02-10 LAB — POCT GLYCOSYLATED HEMOGLOBIN (HGB A1C): Hemoglobin A1C: 7.3 % — AB (ref 4.0–5.6)

## 2019-02-10 NOTE — Progress Notes (Signed)
Subjective:    Patient ID: Donald [name], male    DOB: May 19, 1945, 74 y.o.   MRN: [id number]  HPI Pt returns for f/u of diabetes mellitus: DM type: Insulin-requiring type 2 Dx'ed: 8242 Complications: polyneuropathy, CAD and renal insufficiency.   Therapy: insulin since 1995 DKA: never.  Severe hypoglycemia: never.  Pancreatitis: never.  SDOH: he declines name brand meds Other: he takes multiple daily injections; he declines weight-loss surgery; he has severe insulin resistance; he declines pump; He often eats 2 meals per day, so he takes only 2 injections of novolog.  Interval history: no recent steroids.  he says cbg varies from 59-300.  There is no trend throughout the day.  It was highest after steroid injection into the spinal injection 10 weeks ago.  He has mild hypoglycemia approx 1-2 times per month.   Past Medical History:  Diagnosis Date  . (HFpEF) heart failure with preserved ejection fraction ([place])    a. 09/2018 Echo: EF 60-65%. PASP 30mmHg. Mild-mod LAE. Mild MR/TR.  Marland Kitchen ANEMIA-IRON DEFICIENCY 07/26/2006  . Anxiety   . ASTHMA 07/26/2006  . Asthma   . Back pain, chronic   . Bell's palsy   . CKD (chronic kidney disease), stage III   . COLONIC POLYPS, HX OF 07/26/2006  . DEPRESSION 03/14/2009  . DIABETES MELLITUS, TYPE II 07/26/2006  . [place] DISEASE, LUMBAR 10/05/2007  . DVT 12/03/2007  . DYSLIPIDEMIA 04/13/2009  . Dyspnea    when gets up and walks around and back is hurting really bad -only Shortness of breath  then  . GERD 07/26/2006  . Gout   . Heart murmur   . History of kidney stones   . HYPERTENSION 07/26/2006  . INSOMNIA 08/21/2007  . Neuropathy   . Nonobstructive CAD (coronary artery disease)    a. 2012 Cath: no high grade stenosis; b. 2018 MV: No ischemia. Attenuation artifact.   . OBSTRUCTIVE SLEEP APNEA 12/03/2007   Use C-PAP  . PERIPHERAL NEUROPATHY 07/26/2006  . Pernicious anemia 11/20/2006  . PULMONARY EMBOLISM 10/05/2007    Past Surgical History:    Procedure Laterality Date  . APPENDECTOMY  1968  . [place], 06/14/2008   Dr. Trenton Gammon at [facility] (06/10)  . CARDIAC CATHETERIZATION    . CARPAL TUNNEL RELEASE Right 10/2017  . CATARACT EXTRACTION W/PHACO Left 05/30/2014   Procedure: CATARACT EXTRACTION PHACO AND INTRAOCULAR LENS PLACEMENT (IOC);  Surgeon: Estill Cotta, MD;  Location: [facility] ORS;  Service: Ophthalmology;  Laterality: Left;  [name] 01:20 AP% 23.7 CDE 31.86  . COLONOSCOPY  10/2002   HP, SSA, TA, rpt 3 yrs ([place])  . COLONOSCOPY  01/2005   diverticulosis rpt 5 yrs ([place])   . IR NEPHROSTOMY PLACEMENT LEFT  10/19/2018  . IR URETERAL STENT LEFT NEW ACCESS W/O SEP NEPHROSTOMY CATH  10/19/2018  . LITHOTRIPSY     X 2  . NEPHROLITHOTOMY Left 10/19/2018   Procedure: NEPHROLITHOTOMY PERCUTANEOUS;  Surgeon: Franchot Gallo, MD;  Location: [facility] ORS;  Service: Urology;  Laterality: Left;  3 HRS  . PARATHYROIDECTOMY Right 04/18/2016   PARATHYROIDECTOMY for cyst [name], MD)  . SHOULDER ARTHROSCOPY W/ ROTATOR CUFF REPAIR Right   . TONSILLECTOMY      Social History   Socioeconomic History  . Marital status: Married    Spouse name: Not on file  . Number of children: Not on file  . Years of education: Not on file  . Highest education level: Not on file  Occupational History  .  Occupation: Disabled    Employer: [employer]  Tobacco Use  . Smoking status: Never Smoker  . Smokeless tobacco: Never Used  Substance and Sexual Activity  . Alcohol use: No  . Drug use: No  . Sexual activity: Not on file  Other Topics Concern  . 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   Activity is severely limited by medical problems   Diet is "good".   Never a smoker   Alcohol: none         Social Determinants of Health   Financial Resource Strain:   . Difficulty of Paying Living Expenses: Not on file  Food Insecurity:   . Worried About  Charity fundraiser in the Last Year: Not on file  . Ran Out of Food in the Last Year: Not on file  Transportation Needs:   . Lack of Transportation (Medical): Not on file  . Lack of Transportation (Non-Medical): Not on file  Physical Activity:   . Days of Exercise per Week: Not on file  . Minutes of Exercise per Session: Not on file  Stress:   . Feeling of Stress : Not on file  Social Connections:   . Frequency of Communication with Friends and Family: Not on file  . Frequency of Social Gatherings with Friends and Family: Not on file  . Attends Religious Services: Not on file  . Active Member of Clubs or Organizations: Not on file  . Attends Archivist Meetings: Not on file  . Marital Status: Not on file  Intimate Partner Violence:   . Fear of Current or Ex-Partner: Not on file  . Emotionally Abused: Not on file  . Physically Abused: Not on file  . Sexually Abused: Not on file    Current Outpatient Medications on File Prior to Visit  Medication Sig Dispense Refill  . allopurinol (ZYLOPRIM) 300 MG tablet TAKE ONE TABLET BY MOUTH EVERY DAY (Patient taking differently: Take 300 mg by mouth at bedtime. ) 30 tablet 5  . ALPRAZolam (XANAX) 1 MG tablet TAKE ONE TABLET BY MOUTH AT BEDTIME AS NEEDED FOR SLEEP (Patient taking differently: Take 0.5 mg by mouth at bedtime. ) 30 tablet 0  . colchicine (COLCRYS) 0.6 MG tablet TAKE 1 TABLET BY MOUTH EVERY HOUR UNTIL SYMPTOMS ARE BETTER, MAX OF 6TABS PER DAY. STOP IF DIARRHEA (Patient taking differently: Take 0.6 mg by mouth daily as needed (gout flare ups.). MAX OF 6TABS PER DAY. STOP IF DIARRHEA) 30 tablet 4  . cyanocobalamin (,VITAMIN B-12,) 1000 MCG/ML injection Inject 1,000 mcg into the muscle every 30 (thirty) days.    . ferrous sulfate 324 (65 Fe) MG TBEC Take 1 tablet (325 mg total) by mouth daily. 30 tablet   . fluticasone (FLONASE) 50 MCG/ACT nasal spray Place 2 sprays into both nostrils daily. (Patient taking differently: Place 2  sprays into both nostrils daily as needed (allergies.). ) 16 g 1  . furosemide (LASIX) 80 MG tablet TAKE ONE TABLET (80 MG) BY MOUTH EVERY DAY 90 tablet 3  . gabapentin (NEURONTIN) 600 MG tablet TAKE TWO TABLETS (1200MG ) BY MOUTH AT BEDTIME (Patient taking differently: Take 1,200 mg by mouth at bedtime. ) 180 tablet 3  . GE100 BLOOD GLUCOSE TEST test strip TEST FOUR TIMES DAILY 200 each 1  . HYDROcodone-acetaminophen (NORCO) 10-325 MG tablet Take 1 tablet by mouth every 6 (six) hours as needed for moderate pain ((  typically once to twice daily if needed)).  30 tablet 0  . insulin NPH Human (HUMULIN N) 100 UNIT/ML injection Inject 2.9 mLs (290 Units total) into the skin at bedtime. 90 mL 5  . insulin regular (NOVOLIN R RELION) 100 units/mL injection 3 times a day (just before each meal), 210-150-290 units, and syringes 4/day. 210 mL 0  . losartan (COZAAR) 25 MG tablet Take 1 tablet (25 mg total) by mouth at bedtime. 90 tablet 1  . lovastatin (MEVACOR) 40 MG tablet TAKE 2 TABLETS BY MOUTH (80MG  TOTAL) AT BEDTIME (Patient taking differently: Take 80 mg by mouth at bedtime. ) 180 tablet 3  . Naphazoline-Pheniramine (OPCON-A OP) Place 1 drop into both eyes 3 (three) times daily as needed (itchy eyes.).     Marland Kitchen ondansetron (ZOFRAN) 4 MG tablet Take 1 tablet (4 mg total) by mouth every 4 (four) hours as needed for nausea. 10 tablet 0  . Potassium Citrate 15 MEQ (1620 MG) TBCR Take 1 tablet by mouth 3 (three) times daily.    [name] Sodium (STOOL SOFTENER & LAXATIVE PO) Take 2 tablets by mouth at bedtime.    Marland Kitchen amLODipine (NORVASC) 5 MG tablet Take 1 tablet (5 mg total) by mouth daily. (Patient taking differently: Take 5 mg by mouth daily. Takes 2 tablets daily) 90 tablet 3   No current facility-administered medications on file prior to visit.    Allergies  Allergen Reactions  . Codeine Other (See Comments)    REACTION: chest pain  . Pioglitazone Other (See Comments)    REACTION to Actos:  swelling in ankles    Family History  Problem Relation Age of Onset  . Cancer Mother        Breast Cancer  . Cancer Sister        Breast Cancer  . Diabetes Father   . Heart disease Father   . Diabetes Paternal Grandfather     BP 130/60 (BP Location: Left Arm, Patient Position: Sitting, Cuff Size: Large)   Pulse 65   Ht 5' 5.5" (1.664 m)   Wt 283 lb 3.2 oz (128.5 kg)   SpO2 94%   BMI 46.41 kg/m    Review of Systems Denies LOC.      Objective:   Physical Exam VITAL SIGNS:  See vs page GENERAL: no distress Pulses: dorsalis pedis intact bilat.   MSK: no deformity of the feet CV: 1+ bilat leg edema Skin:  no ulcer on the feet.  normal color and temp on the feet. Neuro: sensation is intact to touch on the feet   Lab Results  Component Value Date   HGBA1C 7.3 (A) 02/10/2019   Lab Results  Component Value Date   CREATININE 1.94 (H) 11/04/2018   BUN 25 (H) 11/04/2018   NA 143 11/04/2018   K 4.6 11/04/2018   CL 104 11/04/2018   CO2 31 11/04/2018      Assessment & Plan:  Insulin-requiring type 2 DM, with CAD: he would benefit from increased rx, if it can be done with a regimen that avoids or minimizes hypoglycemia. Hypoglycemia: this limits aggressiveness of glycemic control.  Renal insuff: in this setting, he needs mostly mealtime insulin.  Patient Instructions  Please increase the insulin to the numbers listed below.   check your blood sugar twice a day.  vary the time of day when you check, between before the 3 meals, and at bedtime.  also check if you have symptoms of your blood sugar being too  high or too low.  please keep a record of the readings and bring it to your next appointment here (or you can bring the meter itself).  You can write it on any piece of paper.  please call us sooner if your blood sugar goes below 70, or if you have a lot of readings over 200.  Please come back for a follow-up appointment in 3 months.

## 2019-02-10 NOTE — Patient Instructions (Signed)
Please increase the insulin to the numbers listed below.   check your blood sugar twice a day.  vary the time of day when you check, between before the 3 meals, and at bedtime.  also check if you have symptoms of your blood sugar being too high or too low.  please keep a record of the readings and bring it to your next appointment here (or you can bring the meter itself).  You can write it on any piece of paper.  please call us sooner if your blood sugar goes below 70, or if you have a lot of readings over 200.  Please come back for a follow-up appointment in 3 months.

## 2019-02-11 DIAGNOSIS — H6983 Other specified disorders of Eustachian tube, bilateral: Secondary | ICD-10-CM | POA: Diagnosis not present

## 2019-02-15 ENCOUNTER — Other Ambulatory Visit: Payer: Self-pay | Admitting: Family Medicine

## 2019-02-15 NOTE — Telephone Encounter (Signed)
Last office visit 12/18/2018 for 4 month follow up.  Last refilled 09/11/2018 for #30 with no refills.  CPE scheduled for 04/23/2019.

## 2019-02-16 ENCOUNTER — Telehealth: Payer: Self-pay | Admitting: Family Medicine

## 2019-02-16 NOTE — Telephone Encounter (Signed)
Patient scheduled his first covid vaccine on 02/20/19.  Patient was told to call his PCP and make sure it's okay for him to receive the vaccine. Please call patient.

## 2019-02-16 NOTE — Telephone Encounter (Addendum)
Glad he got it scheduled. I think it should be ok to take.  Is he taking aspirin 81mg  daily? It is no longer on his med list

## 2019-02-17 MED ORDER — ASPIRIN EC 81 MG PO TBEC: 81.0000 mg | DELAYED_RELEASE_TABLET | Freq: Every day | ORAL | Status: DC

## 2019-02-17 NOTE — Telephone Encounter (Signed)
Pt called back and he is taking an 81mg  aspirin daily.

## 2019-02-17 NOTE — Addendum Note (Signed)
Addended by: Ria Bush on: 02/17/2019 11:23 AM   Modules accepted: Orders

## 2019-02-17 NOTE — Telephone Encounter (Signed)
Left message on vm per dpr relaying Dr. Synthia Innocent message.  Asked pt to call back and let us know if he is or is not taking aspirin daily.

## 2019-02-17 NOTE — Telephone Encounter (Signed)
ERx 

## 2019-02-25 ENCOUNTER — Other Ambulatory Visit: Payer: Self-pay

## 2019-02-25 ENCOUNTER — Ambulatory Visit (INDEPENDENT_AMBULATORY_CARE_PROVIDER_SITE_OTHER): Payer: Medicare Other | Admitting: *Deleted

## 2019-02-25 DIAGNOSIS — K5903 Drug induced constipation: Secondary | ICD-10-CM

## 2019-02-25 DIAGNOSIS — E538 Deficiency of other specified B group vitamins: Secondary | ICD-10-CM

## 2019-02-25 MED ORDER — CYANOCOBALAMIN 1000 MCG/ML IJ SOLN
1000.0000 ug | Freq: Once | INTRAMUSCULAR | Status: AC
  Administered 2019-02-25: 15:00:00 1000 ug via INTRAMUSCULAR

## 2019-02-25 NOTE — Progress Notes (Signed)
Per orders of Dr. Danise Mina, injection of Vit B12 given by Nyoka Cowden, Finley Dinkel M. Patient tolerated injection well.

## 2019-03-03 ENCOUNTER — Telehealth: Payer: Self-pay | Admitting: Family Medicine

## 2019-03-03 NOTE — Chronic Care Management (AMB) (Signed)
  Chronic Care Management   Note  03/03/2019 Name: Donald [name] MRN: [id number] DOB: January 08, 1946  Donald [name] is a 74 y.o. year old male who is a primary care patient of Ria Bush, MD. I reached out to [employer] by phone today in response to a referral sent by [name] Code's PCP, Ria Bush, MD.   [name] was given information about Chronic Care Management services today including:  1. CCM service includes personalized support from designated clinical staff supervised by his physician, including individualized plan of care and coordination with other care providers 2. 24/7 contact phone numbers for assistance for urgent and routine care needs. 3. Service will only be billed when office clinical staff spend 20 minutes or more in a month to coordinate care. 4. Only one practitioner may furnish and bill the service in a calendar month. 5. The patient may stop CCM services at any time (effective at the end of the month) by phone call to the office staff. 6. The patient will be responsible for cost sharing (co-pay) of up to 20% of the service fee (after annual deductible is met).  Patient agreed to services and verbal consent obtained.   Follow up plan:   Raynicia Dukes [employer] Scheduler

## 2019-03-03 NOTE — Progress Notes (Signed)
°  Chronic Care Management   Outreach Note  03/03/2019 Name: INDIGO CHADDOCK MRN: [id number] DOB: 1945/04/28  Referred by: Ria Bush, MD Reason for referral : No chief complaint on file.   An unsuccessful telephone outreach was attempted today. The patient was referred to the pharmacist for assistance with care management and care coordination.   Follow Up Plan:   Raynicia Dukes [employer] Scheduler

## 2019-03-06 ENCOUNTER — Other Ambulatory Visit: Payer: Self-pay | Admitting: Family Medicine

## 2019-03-09 DIAGNOSIS — D351 Benign neoplasm of parathyroid gland: Secondary | ICD-10-CM | POA: Diagnosis not present

## 2019-03-09 DIAGNOSIS — N179 Acute kidney failure, unspecified: Secondary | ICD-10-CM | POA: Diagnosis not present

## 2019-03-09 DIAGNOSIS — Z9989 Dependence on other enabling machines and devices: Secondary | ICD-10-CM | POA: Diagnosis not present

## 2019-03-09 DIAGNOSIS — N1831 Chronic kidney disease, stage 3a: Secondary | ICD-10-CM | POA: Diagnosis not present

## 2019-03-09 DIAGNOSIS — D631 Anemia in chronic kidney disease: Secondary | ICD-10-CM | POA: Diagnosis not present

## 2019-03-09 DIAGNOSIS — I129 Hypertensive chronic kidney disease with stage 1 through stage 4 chronic kidney disease, or unspecified chronic kidney disease: Secondary | ICD-10-CM | POA: Diagnosis not present

## 2019-03-09 DIAGNOSIS — G4733 Obstructive sleep apnea (adult) (pediatric): Secondary | ICD-10-CM | POA: Diagnosis not present

## 2019-03-09 DIAGNOSIS — Z6841 Body Mass Index (BMI) 40.0 and over, adult: Secondary | ICD-10-CM | POA: Diagnosis not present

## 2019-03-09 DIAGNOSIS — N200 Calculus of kidney: Secondary | ICD-10-CM | POA: Diagnosis not present

## 2019-03-09 DIAGNOSIS — M109 Gout, unspecified: Secondary | ICD-10-CM | POA: Diagnosis not present

## 2019-03-09 DIAGNOSIS — E1122 Type 2 diabetes mellitus with diabetic chronic kidney disease: Secondary | ICD-10-CM | POA: Diagnosis not present

## 2019-03-09 DIAGNOSIS — N2581 Secondary hyperparathyroidism of renal origin: Secondary | ICD-10-CM | POA: Diagnosis not present

## 2019-03-12 ENCOUNTER — Telehealth: Payer: Medicare Other

## 2019-03-12 ENCOUNTER — Ambulatory Visit: Payer: Medicare Other

## 2019-03-12 DIAGNOSIS — K5903 Drug induced constipation: Secondary | ICD-10-CM

## 2019-03-12 DIAGNOSIS — E785 Hyperlipidemia, unspecified: Secondary | ICD-10-CM

## 2019-03-12 DIAGNOSIS — Z86711 Personal history of pulmonary embolism: Secondary | ICD-10-CM

## 2019-03-12 DIAGNOSIS — D509 Iron deficiency anemia, unspecified: Secondary | ICD-10-CM

## 2019-03-12 DIAGNOSIS — IMO0002 Reserved for concepts with insufficient information to code with codable children: Secondary | ICD-10-CM

## 2019-03-12 DIAGNOSIS — E538 Deficiency of other specified B group vitamins: Secondary | ICD-10-CM

## 2019-03-12 DIAGNOSIS — K219 Gastro-esophageal reflux disease without esophagitis: Secondary | ICD-10-CM

## 2019-03-12 DIAGNOSIS — F5104 Psychophysiologic insomnia: Secondary | ICD-10-CM

## 2019-03-12 DIAGNOSIS — M545 Low back pain, unspecified: Secondary | ICD-10-CM

## 2019-03-12 DIAGNOSIS — I5032 Chronic diastolic (congestive) heart failure: Secondary | ICD-10-CM

## 2019-03-12 DIAGNOSIS — E559 Vitamin D deficiency, unspecified: Secondary | ICD-10-CM

## 2019-03-12 DIAGNOSIS — I739 Peripheral vascular disease, unspecified: Secondary | ICD-10-CM

## 2019-03-12 DIAGNOSIS — E1142 Type 2 diabetes mellitus with diabetic polyneuropathy: Secondary | ICD-10-CM

## 2019-03-12 DIAGNOSIS — M1A9XX0 Chronic gout, unspecified, without tophus (tophi): Secondary | ICD-10-CM

## 2019-03-12 DIAGNOSIS — M5137 Other intervertebral disc degeneration, lumbosacral region: Secondary | ICD-10-CM

## 2019-03-12 DIAGNOSIS — I10 Essential (primary) hypertension: Secondary | ICD-10-CM

## 2019-03-12 NOTE — Chronic Care Management (AMB) (Signed)
Chronic Care Management Pharmacy  Name: Donald [name]  MRN: [id number] DOB: Aug 26, 1945  Chief Complaint/ HPI  Donald [name],  74 y.o. , male presents for their Initial CCM visit with the clinical pharmacist via telephone.  PCP : Ria Bush, MD  Their chronic conditions include: hypertension, coronary artery disease, heart failure, allergic rhinitis, GERD, chronic kidney disease, type 2 diabetes, kidney stone, dyslipidemia, iron deficiency anemia, depression, chronic insomnia, chronic gout, constipation  Patient concerns: burning stabbing pain on side of left foot for the past several months, very painful at night; will take 1 colchicine, rub foot with Aspercreme and put a sock on - this helps some, kidney specialist recommended trying Capsaicin (has not tried yet)  Office Visits:  12/18/18: Danise Mina - restart losartan, pt stopped due to concern for hyperkalemia, pt to provide today's lab work from [place]  11/04/18: Danise Mina - restart iron daily due to blood loss anemia, check uric acid for gout flare -> 5.0   Consult Visit:  02/10/19: Endocrinology - increase insulin, RTC 3 months  11/10/18: Endocrinology - increase insulin  Allergies  Allergen Reactions  . Codeine Other (See Comments)    REACTION: chest pain  . Pioglitazone Other (See Comments)    REACTION to Actos: swelling in ankles   Medications: Outpatient Encounter Medications as of 03/12/2019  Medication Sig  . allopurinol (ZYLOPRIM) 300 MG tablet TAKE ONE TABLET EVERY DAY  . ALPRAZolam (XANAX) 1 MG tablet TAKE ONE TABLET AT BEDTIME AS NEEDED FORSLEEP  . amLODipine (NORVASC) 5 MG tablet Take 1 tablet (5 mg total) by mouth daily. (Patient taking differently: Take 5 mg by mouth daily. Takes 2 tablets daily)  . aspirin EC 81 MG tablet Take 1 tablet (81 mg total) by mouth daily.  . colchicine (COLCRYS) 0.6 MG tablet TAKE 1 TABLET BY MOUTH EVERY HOUR UNTIL SYMPTOMS ARE BETTER, MAX OF 6TABS PER DAY. STOP IF  DIARRHEA (Patient taking differently: Take 0.6 mg by mouth daily as needed (gout flare ups.). MAX OF 6TABS PER DAY. STOP IF DIARRHEA)  . cyanocobalamin (,VITAMIN B-12,) 1000 MCG/ML injection Inject 1,000 mcg into the muscle every 30 (thirty) days.  . ferrous sulfate 324 (65 Fe) MG TBEC Take 1 tablet (325 mg total) by mouth daily.  . fluticasone (FLONASE) 50 MCG/ACT nasal spray Place 2 sprays into both nostrils daily. (Patient taking differently: Place 2 sprays into both nostrils daily as needed (allergies.). )  . furosemide (LASIX) 80 MG tablet TAKE ONE TABLET (80 MG) BY MOUTH EVERY DAY  . gabapentin (NEURONTIN) 600 MG tablet TAKE TWO TABLETS (1200MG ) BY MOUTH AT BEDTIME (Patient taking differently: Take 1,200 mg by mouth at bedtime. )  . GE100 BLOOD GLUCOSE TEST test strip TEST FOUR TIMES DAILY  . HYDROcodone-acetaminophen (NORCO) 10-325 MG tablet Take 1 tablet by mouth every 6 (six) hours as needed for moderate pain ((typically once to twice daily if needed)).   Marland Kitchen insulin NPH Human (HUMULIN N) 100 UNIT/ML injection Inject 2.9 mLs (290 Units total) into the skin at bedtime.  . insulin regular (NOVOLIN R RELION) 100 units/mL injection 3 times a day (just before each meal), 210-150-290 units, and syringes 4/day.  . losartan (COZAAR) 25 MG tablet TAKE ONE TABLET AT BEDTIME  . lovastatin (MEVACOR) 40 MG tablet TAKE 2 TABLETS BY MOUTH (80MG  TOTAL) AT BEDTIME (Patient taking differently: Take 80 mg by mouth at bedtime. )  . Naphazoline-Pheniramine (OPCON-A OP) Place 1 drop into both eyes 3 (three) times daily as needed (  itchy eyes.).   Marland Kitchen ondansetron (ZOFRAN) 4 MG tablet Take 1 tablet (4 mg total) by mouth every 4 (four) hours as needed for nausea.  . Potassium Citrate 15 MEQ (1620 MG) TBCR Take 1 tablet by mouth 3 (three) times daily.  [name] Sodium (STOOL SOFTENER & LAXATIVE PO) Take 2 tablets by mouth at bedtime.   No facility-administered encounter medications on file as of 03/12/2019.     Current Diagnosis/Assessment:  Hyperlipidemia/CAD/PAD   Goals    . Pharmacy Care Plan     Current Barriers:  . Chronic Disease Management support, education, and care coordination needs related to hypertension, coronary artery disease, heart failure, allergic rhinitis, GERD, chronic kidney disease, type 2 diabetes, kidney stone, dyslipidemia, iron deficiency anemia, depression, chronic insomnia, chronic gout, constipation  Pharmacist Clinical Goal(s):  . Remain up to date on vaccinations. Recommend tetanus vaccine (Tdap). Chesley Noon gout pain. Trial of capsaicin cream 3-4 times daily for 2 weeks. Pharmacist will follow up in 2 weeks to assess pain. . Effectively treat gout flares. At first sign of a gout flare, take 2 colchicine followed by an additional tablet 1 hour later, then twice daily for duration of flare (usually 3-5 days). Do not exceed 3 tablets in 24 hours.   Interventions: . Comprehensive medication review performed.  Patient Self Care Activities:  . Self administers medications as prescribed  Initial goal documentation       Lipid Panel     Component Value Date/Time   CHOL 103 03/18/2018 0920   TRIG 190.0 (H) 03/18/2018 0920   HDL 27.30 (L) 03/18/2018 0920   CHOLHDL 4 03/18/2018 0920   VLDL 38.0 03/18/2018 0920   LDLCALC 37 03/18/2018 0920   LDLDIRECT 52.2 05/25/2009 0937    CBC Latest Ref Rng & Units 10/28/2018 10/21/2018 10/20/2018  WBC 4.0 - 10.5 K/uL 8.1 - -  Hemoglobin 13.0 - 17.0 g/dL 10.2(L) 10.3(L) 9.5(L)  Hematocrit 39.0 - 52.0 % 30.8(L) 32.0(L) 31.8(L)  Platelets 150.0 - 400.0 K/uL 220.0 - -   LDL goal < 70 Patient has failed these meds in past: none  Patient is currently controlled on the following medications:   Lovastatin 40 mg - take 2 tablets at bedtime  Aspirin 81 mg - 1 tablet daily  Plan: Continue current medications  Diabetes  Sees Dr. Loanne Drilling every 2-3 months   Recent Relevant Labs: Lab Results  Component Value Date/Time    HGBA1C 7.3 (A) 02/10/2019 01:20 PM   HGBA1C 7.9 (H) 10/15/2018 02:11 PM   HGBA1C 7.7 (A) 08/04/2018 03:24 PM   HGBA1C 8.3 (H) 03/18/2018 09:20 AM   MICROALBUR 12.0 (H) 03/02/2015 11:42 AM   MICROALBUR 2.6 (H) 01/27/2014 11:49 AM  A1c goal < 7% Checking BG: 1-2 times a day Hypoglycemia: once about a month ago, overnight, skipped evening snack and had a small dinner, none since    Usually eats 2 meals a day, only takes his insulin before meals Patient is currently uncontrolled on the following medications:   Humulin N (insulin NPH) - Inject 290 units qHS  Novolin R (insulin regular) - Inject TID before meals (210-150-290 units)  Last diabetic eye exam:  Lab Results  Component Value Date/Time   HMDIABEYEEXA Retinopathy (A) 05/21/2017 03:54 PM    Last diabetic foot exam:  Dr. Loanne Drilling checks his feet and ankles at every appt   Plan: Continue current medications and close follow up with Dr. Loanne Drilling   Heart Failure   Type: Diastolic Symptoms: SOB with pain,  denies swelling Weight: doesn't weigh daily, checks feet for swelling, also notices increased SOB if swelling  Last ejection fraction: 10/01/18 60-65% NYHA Class: I (no actitivty limitation) AHA HF Stage: C (Heart disease and symptoms present)  Patient has failed these meds in past: none Patient is currently controlled on the following medications:   Furosemide 80 mg - 1 tablet daily   Potassium Citrate 15 mEq - 1 tablet TID (per urology, for kidney stone)  We discussed: followed by Silvano Rusk for kidney stone   Plan: Continue current medications  Hypertension   Office blood pressures are  BP Readings from Last 3 Encounters:  02/10/19 130/60  12/18/18 136/70  11/10/18 138/60   CMP Latest Ref Rng & Units 11/04/2018 10/28/2018 10/19/2018  Glucose 70 - 99 mg/dL 160(H) 132(H) 149(H)  BUN 6 - 23 mg/dL 25(H) 62(H) 33(H)  Creatinine 0.40 - 1.50 mg/dL 1.94(H) 2.51(H) 1.54(H)  Sodium 135 - 145 mEq/L 143 142 143    Potassium 3.5 - 5.1 mEq/L 4.6 4.4 3.6  Chloride 96 - 112 mEq/L 104 102 103  CO2 19 - 32 mEq/L 31 31 27   Calcium 8.4 - 10.5 mg/dL 9.1 8.9 9.6  Total Protein 6.0 - 8.3 g/dL - 6.1 -  Total Bilirubin 0.2 - 1.2 mg/dL - 1.1 -  Alkaline Phos 39 - 117 U/L - 80 -  AST 0 - 37 U/L - 10 -  ALT 0 - 53 U/L - 13 -   CrCl (adjbw): 44 mll/min   GFR (11/04/18): 34 ml/min Patient has failed these meds in the past: none  Patient checks BP at home when feeling symptomatic Patient home BP readings are ranging: 130/60-65 mmHg  Patient is currently controlled on the following medications:   Amlodipine 5 mg - 1 tablet daily   Losartan 25 mg - 1 tablet daily at bedtime  We discussed: pt resumed losartan 12/20, no labs since restarting   Plan: Continue current medications   Gout   Symptoms: Gout flares usually in his ankle, no left side of foot  Recent flares: Current flare has lasted several months Uric acid (11/04/18): 5.0 Patient has failed these meds in past: Biofreeze - some relief Patient is currently uncontrolled on the following medications:   Allopurinol 300 mg - 1 tablet daily after meal  Colchicine 0.6 mg - 1 tablet PRN every hours four gout flare, max 6 tabs/24 hour   OTC aspercreme (lidocaine), lidocaine patches  We discussed:  Never takes more than 1 colchicine daily even with first onset of flare, recommended taking 2 tablets at first sign of flare and 1 additional tablet 1 hour later followed by BID dosing for duration of flare. Kidney specialist recommended capsaicin at topical alternative; Per patient, Dr. Darnell Level evaluated his foot at previous appt and did not have concerns. Based on CrCl > 30 ml/min, no dose adjustments recommended for renal function with allopurinol or colchicine.   Plan: Continue current medications. Recommend max dose of colchicine of 1.8 mg per day. Take at first sign of gout flare. Refer to PCP for further evaluation.  Depression/Insomnia   Patient has failed  these meds in past: nortriptyline (overly sedated) Patient is currently controlled on the following medications:   Alprazolam 1 mg - 1/2 tablet at bedtime PRN  We discussed: reduced to 1/2 tablet to prevent grogginess, takes every night   Plan: Continue current medications  Anemia  Vitamin B12 (08/10/18): 335 Iron panel (03/18/18): within normal limits  Patient has failed these meds  in past: none  Patient is currently controlled on the following medications:   Vitamin B12 1000 mcg/mL - self-injects once every 30 days  Ferrous sulfate 324 mg - 1 tablet daily  We discussed: confirmed adherence   Plan: Continue current medications  Peripheral Neuropathy  Reports ruptured disc at age 81, severe back pain  Patient has failed these meds in past: none  Patient is currently controlled on the following medications:   Gabapentin 600 mg - 2 tablets qHS PRN  Hydrocodone-acetaminophen 10-325 mg - 1 tablet every 6 hours as needed  Bowel regimen: [employer] brand stool softener (docusate) - 2 tabs qPM We discussed: usually takes one hydrocodone every morning, may take another one after dinner; occasionally will take a hydrocodone for his foot pain lately, steroid injection in back about every 3 months, back pain is controlled with current treatment  Plan: Continue current medications  Medication Management  Misc: Zofran 4 mg - q4h PRN, Opcon A Eye drops - 1 drop BID for itching/burning (right eye cataract);  Sennosides-docusate - 2 tabs qHs - not taking  OTCs: Flonase 2 sprays each nare daily - not taking, no allergies anymore;  Sennosides-docusate - 2 tabs qHs - not taking  Pharmacy: [facility]: [facility] close to owners   Adherence: pillbox  Affordability: did not report any concerns  Vaccines: Recommend Tdap   CCM Follow Up: March 26, 2019 at 1:30 PM (telephone) for resolution of gout flare/pain  Debbora Dus, PharmD Clinical Pharmacist [facility] at  [facility] [phone]

## 2019-03-14 NOTE — Patient Instructions (Signed)
Dear Donald [name],  It was a pleasure meeting you during our initial appointment on March 12, 2019. Below is a summary of the goals we discussed and components of chronic care management. Please contact me anytime with questions or concerns.   Visit Information  Goals Addressed            This Visit's Progress   . Pharmacy Care Plan       Current Barriers:  . Chronic Disease Management support, education, and care coordination needs related to hypertension, coronary artery disease, heart failure, allergic rhinitis, GERD, chronic kidney disease, type 2 diabetes, kidney stone, dyslipidemia, iron deficiency anemia, depression, chronic insomnia, chronic gout, constipation  Pharmacist Clinical Goal(s):  . Remain up to date on vaccinations. Recommend tetanus vaccine (Tdap). Chesley Noon gout pain. Trial of capsaicin cream 3-4 times daily for 2 weeks. Pharmacist will follow up in 2 weeks to assess pain. . Effectively treat gout flares. At first sign of a gout flare, take 2 colchicine followed by an additional tablet 1 hour later, then twice daily for duration of flare (usually 3-5 days). Do not exceed 3 tablets in 24 hours.   Interventions: . Comprehensive medication review performed.  Patient Self Care Activities:  . Self administers medications as prescribed  Initial goal documentation        Mr. Macaraeg was given information about Chronic Care Management services today including:  1. CCM service includes personalized support from designated clinical staff supervised by his physician, including individualized plan of care and coordination with other care providers 2. 24/7 contact phone numbers for assistance for urgent and routine care needs. 3. Service will only be billed when office clinical staff spend 20 minutes or more in a month to coordinate care. 4. Only one practitioner may furnish and bill the service in a calendar month. 5. The patient may stop CCM services at any  time (effective at the end of the month) by phone call to the office staff. 6. The patient will be responsible for cost sharing (co-pay) of up to 20% of the service fee (after annual deductible is met).  Patient agreed to services and verbal consent obtained.   Print copy of patient instructions provided.  Telephone follow up appointment with pharmacy team member scheduled for: March 26, 2019 at 1:30 PM (telephone) for resolution of gout flare/pain  Debbora Dus, PharmD Clinical Pharmacist [facility] at [facility] [phone]   Gout  Gout is painful swelling of your joints. Gout is a type of arthritis. It is caused by having too much uric acid in your body. Uric acid is a chemical that is made when your body breaks down substances called purines. If your body has too much uric acid, sharp crystals can form and build up in your joints. This causes pain and swelling. Gout attacks can happen quickly and be very painful (acute gout). Over time, the attacks can affect more joints and happen more often (chronic gout). What are the causes?  Too much uric acid in your blood. This can happen because: ? Your kidneys do not remove enough uric acid from your blood. ? Your body makes too much uric acid. ? You eat too many foods that are high in purines. These foods include organ meats, some seafood, and beer.  Trauma or stress. What increases the risk?  Having a family history of gout.  Being male and middle-aged.  Being male and having gone through menopause.  Being very overweight (obese).  Drinking  alcohol, especially beer.  Not having enough water in the body (being dehydrated).  Losing weight too quickly.  Having an organ transplant.  Having lead poisoning.  Taking certain medicines.  Having kidney disease.  Having a skin condition called psoriasis. What are the signs or symptoms? An attack of acute gout usually happens in just one joint. The most common  place is the big toe. Attacks often start at night. Other joints that may be affected include joints of the feet, ankle, knee, fingers, wrist, or elbow. Symptoms of an attack may include:  Very bad pain.  Warmth.  Swelling.  Stiffness.  Shiny, red, or purple skin.  Tenderness. The affected joint may be very painful to touch.  Chills and fever. Chronic gout may cause symptoms more often. More joints may be involved. You may also have white or yellow lumps (tophi) on your hands or feet or in other areas near your joints. How is this treated?  Treatment for this condition has two phases: treating an acute attack and preventing future attacks.  Acute gout treatment may include: ? NSAIDs. ? Steroids. These are taken by mouth or injected into a joint. ? Colchicine. This medicine relieves pain and swelling. It can be given by mouth or through an IV tube.  Preventive treatment may include: ? Taking small doses of NSAIDs or colchicine daily. ? Using a medicine that reduces uric acid levels in your blood. ? Making changes to your diet. You may need to see a food expert (dietitian) about what to eat and drink to prevent gout. Follow these instructions at home: During a gout attack   If told, put ice on the painful area: ? Put ice in a plastic bag. ? Place a towel between your skin and the bag. ? Leave the ice on for 20 minutes, 2-3 times a day.  Raise (elevate) the painful joint above the level of your heart as often as you can.  Rest the joint as much as possible. If the joint is in your leg, you may be given crutches.  Follow instructions from your doctor about what you cannot eat or drink. Avoiding future gout attacks  Eat a low-purine diet. Avoid foods and drinks such as: ? Liver. ? Kidney. ? Anchovies. ? Asparagus. ? Herring. ? Mushrooms. ? Mussels. ? Beer.  Stay at a healthy weight. If you want to lose weight, talk with your doctor. Do not lose weight too fast.   Start or continue an exercise plan as told by your doctor. Eating and drinking  Drink enough fluids to keep your pee (urine) pale yellow.  If you drink alcohol: ? Limit how much you use to:  0-1 drink a day for women.  0-2 drinks a day for men. ? Be aware of how much alcohol is in your drink. In the [place] one drink equals one 12 oz bottle of beer (355 mL), one 5 oz glass of wine (148 mL), or one 1 oz glass of hard liquor (44 mL). General instructions  Take over-the-counter and prescription medicines only as told by your doctor.  Do not drive or use heavy machinery while taking prescription pain medicine.  Return to your normal activities as told by your doctor. Ask your doctor what activities are safe for you.  Keep all follow-up visits as told by your doctor. This is important. Contact a doctor if:  You have another gout attack.  You still have symptoms of a gout attack after 10 days of treatment.  You have problems (side effects) because of your medicines.  You have chills or a fever.  You have burning pain when you pee (urinate).  You have pain in your lower back or belly. Get help right away if:  You have very bad pain.  Your pain cannot be controlled.  You cannot pee. Summary  Gout is painful swelling of the joints.  The most common site of pain is the big toe, but it can affect other joints.  Medicines and avoiding some foods can help to prevent and treat gout attacks. This information is not intended to replace advice given to you by your health care provider. Make sure you discuss any questions you have with your health care provider. Document Revised: 07/23/2017 Document Reviewed: 07/23/2017 Elsevier Patient Education  [place].

## 2019-03-17 DIAGNOSIS — N200 Calculus of kidney: Secondary | ICD-10-CM | POA: Diagnosis not present

## 2019-03-26 ENCOUNTER — Telehealth: Payer: Medicare Other

## 2019-03-26 NOTE — Chronic Care Management (AMB) (Deleted)
Chronic Care Management Pharmacy  Name: Donald [name]  MRN: [id number] DOB: 03-Oct-1945  Chief Complaint/ HPI  Donald [name],  74 y.o., male presents for their Follow-Up CCM visit with the clinical pharmacist via telephone.  PCP : Ria Bush, MD  Their chronic conditions include: hypertension, coronary artery disease, heart failure, allergic rhinitis, GERD, chronic kidney disease, type 2 diabetes, kidney stone, dyslipidemia, iron deficiency anemia, depression, chronic insomnia, chronic gout, constipation  Patient concerns: burning stabbing pain on side of left foot for the past several months, very painful at night; will take 1 colchicine, rub foot with Aspercreme and put a sock on - this helps some, kidney specialist recommended trying Capsaicin (has not tried yet)  Office Visits:  12/18/18: Danise Mina - restart losartan, pt stopped due to concern for hyperkalemia, pt to provide today's lab work from [place]  11/04/18: Danise Mina - restart iron daily due to blood loss anemia, check uric acid for gout flare -> 5.0   Consult Visit:  02/10/19: Endocrinology - increase insulin, RTC 3 months  11/10/18: Endocrinology - increase insulin  Allergies  Allergen Reactions  . Codeine Other (See Comments)    REACTION: chest pain  . Pioglitazone Other (See Comments)    REACTION to Actos: swelling in ankles   Medications: Outpatient Encounter Medications as of 03/26/2019  Medication Sig Note  . allopurinol (ZYLOPRIM) 300 MG tablet TAKE ONE TABLET EVERY DAY   . ALPRAZolam (XANAX) 1 MG tablet TAKE ONE TABLET AT BEDTIME AS NEEDED FORSLEEP 03/14/2019: Taking 1/2 tablet every night  . amLODipine (NORVASC) 5 MG tablet Take 1 tablet (5 mg total) by mouth daily.   Marland Kitchen aspirin EC 81 MG tablet Take 1 tablet (81 mg total) by mouth daily.   . colchicine (COLCRYS) 0.6 MG tablet TAKE 1 TABLET BY MOUTH EVERY HOUR UNTIL SYMPTOMS ARE BETTER, MAX OF 6TABS PER DAY. STOP IF DIARRHEA   .  cyanocobalamin (,VITAMIN B-12,) 1000 MCG/ML injection Inject 1,000 mcg into the muscle every 30 (thirty) days.   . ferrous sulfate 324 (65 Fe) MG TBEC Take 1 tablet (325 mg total) by mouth daily.   . fluticasone (FLONASE) 50 MCG/ACT nasal spray Place 2 sprays into both nostrils daily. (Patient not taking: Reported on 03/14/2019)   . furosemide (LASIX) 80 MG tablet TAKE ONE TABLET (80 MG) BY MOUTH EVERY DAY   . gabapentin (NEURONTIN) 600 MG tablet TAKE TWO TABLETS (1200MG ) BY MOUTH AT BEDTIME   . GE100 BLOOD GLUCOSE TEST test strip TEST FOUR TIMES DAILY   . HYDROcodone-acetaminophen (NORCO) 10-325 MG tablet Take 1 tablet by mouth every 6 (six) hours as needed for moderate pain ((typically once to twice daily if needed)).    Marland Kitchen insulin NPH Human (HUMULIN N) 100 UNIT/ML injection Inject 2.9 mLs (290 Units total) into the skin at bedtime.   . insulin regular (NOVOLIN R RELION) 100 units/mL injection 3 times a day (just before each meal), 210-150-290 units, and syringes 4/day.   . losartan (COZAAR) 25 MG tablet TAKE ONE TABLET AT BEDTIME   . lovastatin (MEVACOR) 40 MG tablet TAKE 2 TABLETS BY MOUTH (80MG  TOTAL) AT BEDTIME   . Naphazoline-Pheniramine (OPCON-A OP) Place 1 drop into both eyes 3 (three) times daily as needed (itchy eyes.).    Marland Kitchen ondansetron (ZOFRAN) 4 MG tablet Take 1 tablet (4 mg total) by mouth every 4 (four) hours as needed for nausea.   . Potassium Citrate 15 MEQ (1620 MG) TBCR Take 1 tablet by mouth 3 (three)  times daily.   [name] Sodium (STOOL SOFTENER & LAXATIVE PO) Take 2 tablets by mouth at bedtime.    No facility-administered encounter medications on file as of 03/26/2019.   Current Diagnosis/Assessment:  Hyperlipidemia/CAD/PAD   Goals    . Pharmacy Care Plan     Current Barriers:  . Chronic Disease Management support, education, and care coordination needs related to hypertension, coronary artery disease, heart failure, allergic rhinitis, GERD, chronic kidney  disease, type 2 diabetes, kidney stone, dyslipidemia, iron deficiency anemia, depression, chronic insomnia, chronic gout, constipation  Pharmacist Clinical Goal(s):  . Remain up to date on vaccinations. Recommend tetanus vaccine (Tdap). Chesley Noon gout pain. Trial of capsaicin cream 3-4 times daily for 2 weeks. Pharmacist will follow up in 2 weeks to assess pain. . Effectively treat gout flares. At first sign of a gout flare, take 2 colchicine followed by an additional tablet 1 hour later, then twice daily for duration of flare (usually 3-5 days). Do not exceed 3 tablets in 24 hours.   Interventions: . Comprehensive medication review performed.  Patient Self Care Activities:  . Self administers medications as prescribed  Initial goal documentation       Lipid Panel     Component Value Date/Time   CHOL 103 03/18/2018 0920   TRIG 190.0 (H) 03/18/2018 0920   HDL 27.30 (L) 03/18/2018 0920   CHOLHDL 4 03/18/2018 0920   VLDL 38.0 03/18/2018 0920   LDLCALC 37 03/18/2018 0920   LDLDIRECT 52.2 05/25/2009 0937    CBC Latest Ref Rng & Units 10/28/2018 10/21/2018 10/20/2018  WBC 4.0 - 10.5 K/uL 8.1 - -  Hemoglobin 13.0 - 17.0 g/dL 10.2(L) 10.3(L) 9.5(L)  Hematocrit 39.0 - 52.0 % 30.8(L) 32.0(L) 31.8(L)  Platelets 150.0 - 400.0 K/uL 220.0 - -   LDL goal < 70 Patient has failed these meds in past: none  Patient is currently controlled on the following medications:   Lovastatin 40 mg - take 2 tablets at bedtime  Aspirin 81 mg - 1 tablet daily  Plan: Continue current medications  Diabetes  Sees Dr. Loanne Drilling every 2-3 months   Recent Relevant Labs: Lab Results  Component Value Date/Time   HGBA1C 7.3 (A) 02/10/2019 01:20 PM   HGBA1C 7.9 (H) 10/15/2018 02:11 PM   HGBA1C 7.7 (A) 08/04/2018 03:24 PM   HGBA1C 8.3 (H) 03/18/2018 09:20 AM   MICROALBUR 12.0 (H) 03/02/2015 11:42 AM   MICROALBUR 2.6 (H) 01/27/2014 11:49 AM  A1c goal < 7% Checking BG: 1-2 times a day Hypoglycemia: once  about a month ago, overnight, skipped evening snack and had a small dinner, none since    Usually eats 2 meals a day, only takes his insulin before meals Patient is currently uncontrolled on the following medications:   Humulin N (insulin NPH) - Inject 290 units qHS  Novolin R (insulin regular) - Inject TID before meals (210-150-290 units)  Last diabetic eye exam:  Lab Results  Component Value Date/Time   HMDIABEYEEXA Retinopathy (A) 05/21/2017 03:54 PM    Last diabetic foot exam:  Dr. Loanne Drilling checks his feet and ankles at every appt   Plan: Continue current medications and close follow up with Dr. Loanne Drilling   Heart Failure   Type: Diastolic Symptoms: SOB with pain, denies swelling Weight: doesn't weigh daily, checks feet for swelling, also notices increased SOB if swelling  Last ejection fraction: 10/01/18 60-65% NYHA Class: I (no actitivty limitation) AHA HF Stage: C (Heart disease and symptoms present)  Patient has failed  these meds in past: none Patient is currently controlled on the following medications:   Furosemide 80 mg - 1 tablet daily   Potassium Citrate 15 mEq - 1 tablet TID (per urology, for kidney stone)  We discussed: followed by Silvano Rusk for kidney stone   Plan: Continue current medications  Hypertension   Office blood pressures are  BP Readings from Last 3 Encounters:  02/10/19 130/60  12/18/18 136/70  11/10/18 138/60   CMP Latest Ref Rng & Units 11/04/2018 10/28/2018 10/19/2018  Glucose 70 - 99 mg/dL 160(H) 132(H) 149(H)  BUN 6 - 23 mg/dL 25(H) 62(H) 33(H)  Creatinine 0.40 - 1.50 mg/dL 1.94(H) 2.51(H) 1.54(H)  Sodium 135 - 145 mEq/L 143 142 143  Potassium 3.5 - 5.1 mEq/L 4.6 4.4 3.6  Chloride 96 - 112 mEq/L 104 102 103  CO2 19 - 32 mEq/L 31 31 27   Calcium 8.4 - 10.5 mg/dL 9.1 8.9 9.6  Total Protein 6.0 - 8.3 g/dL - 6.1 -  Total Bilirubin 0.2 - 1.2 mg/dL - 1.1 -  Alkaline Phos 39 - 117 U/L - 80 -  AST 0 - 37 U/L - 10 -  ALT 0 - 53 U/L -  13 -   CrCl (adjbw): 44 mll/min   GFR (11/04/18): 34 ml/min Patient has failed these meds in the past: none  Patient checks BP at home when feeling symptomatic Patient home BP readings are ranging: 130/60-65 mmHg  Patient is currently controlled on the following medications:   Amlodipine 5 mg - 1 tablet daily   Losartan 25 mg - 1 tablet daily at bedtime  We discussed: pt resumed losartan 12/20, no labs since restarting   Plan: Continue current medications   Gout   Symptoms: Gout flares usually in his ankle, no left side of foot  Recent flares: Current flare has lasted several months Uric acid (11/04/18): 5.0 Patient has failed these meds in past: Biofreeze - some relief Patient is currently uncontrolled on the following medications:   Allopurinol 300 mg - 1 tablet daily after meal  Colchicine 0.6 mg - 1 tablet PRN every hours four gout flare, max 6 tabs/24 hour   OTC aspercreme (lidocaine), lidocaine patches  We discussed:  Never takes more than 1 colchicine daily even with first onset of flare, recommended taking 2 tablets at first sign of flare and 1 additional tablet 1 hour later followed by BID dosing for duration of flare. Kidney specialist recommended capsaicin at topical alternative; Per patient, Dr. Darnell Level evaluated his foot at previous appt and did not have concerns. Based on CrCl > 30 ml/min, no dose adjustments recommended for renal function with allopurinol or colchicine.   Plan: Continue current medications. Recommend max dose of colchicine of 1.8 mg per day. Take at first sign of gout flare. Refer to PCP for further evaluation.  Depression/Insomnia   Patient has failed these meds in past: nortriptyline (overly sedated) Patient is currently controlled on the following medications:   Alprazolam 1 mg - 1/2 tablet at bedtime PRN  We discussed: reduced to 1/2 tablet to prevent grogginess, takes every night   Plan: Continue current medications  Anemia  Vitamin B12  (08/10/18): 335 Iron panel (03/18/18): within normal limits  Patient has failed these meds in past: none  Patient is currently controlled on the following medications:   Vitamin B12 1000 mcg/mL - self-injects once every 30 days  Ferrous sulfate 324 mg - 1 tablet daily  We discussed: confirmed adherence   Plan: Continue  current medications  Peripheral Neuropathy  Reports ruptured disc at age 79, severe back pain  Patient has failed these meds in past: none  Patient is currently controlled on the following medications:   Gabapentin 600 mg - 2 tablets qHS PRN  Hydrocodone-acetaminophen 10-325 mg - 1 tablet every 6 hours as needed  Bowel regimen: [employer] brand stool softener (docusate) - 2 tabs qPM We discussed: usually takes one hydrocodone every morning, may take another one after dinner; occasionally will take a hydrocodone for his foot pain lately, steroid injection in back about every 3 months, back pain is controlled with current treatment  Plan: Continue current medications  Medication Management  Misc: Zofran 4 mg - q4h PRN, Opcon A Eye drops - 1 drop BID for itching/burning (right eye cataract);  Sennosides-docusate - 2 tabs qHs - not taking  OTCs: Flonase 2 sprays each nare daily - not taking, no allergies anymore;  Sennosides-docusate - 2 tabs qHs - not taking  Pharmacy: [facility]: [facility] close to owners   Adherence: pillbox  Affordability: did not report any concerns  Vaccines: Recommend Tdap   CCM Follow Up: March 26, 2019 at 1:30 PM (telephone) for resolution of gout flare/pain  Debbora Dus, PharmD Clinical Pharmacist [facility] at [facility] [phone]

## 2019-03-30 ENCOUNTER — Ambulatory Visit (INDEPENDENT_AMBULATORY_CARE_PROVIDER_SITE_OTHER): Payer: Medicare Other | Admitting: *Deleted

## 2019-03-30 ENCOUNTER — Other Ambulatory Visit: Payer: Self-pay

## 2019-03-30 DIAGNOSIS — E538 Deficiency of other specified B group vitamins: Secondary | ICD-10-CM | POA: Diagnosis not present

## 2019-03-30 MED ORDER — CYANOCOBALAMIN 1000 MCG/ML IJ SOLN
1000.0000 ug | Freq: Once | INTRAMUSCULAR | Status: AC
  Administered 2019-03-30: 1000 ug via INTRAMUSCULAR

## 2019-03-30 NOTE — Progress Notes (Signed)
Per orders of Dr. Danise Mina, injection of Vit B12 given by Nyoka Cowden, Emmert Roethler M. Patient tolerated injection well.

## 2019-04-09 ENCOUNTER — Other Ambulatory Visit: Payer: Self-pay

## 2019-04-09 ENCOUNTER — Ambulatory Visit: Payer: Medicare Other

## 2019-04-09 DIAGNOSIS — M1A9XX0 Chronic gout, unspecified, without tophus (tophi): Secondary | ICD-10-CM

## 2019-04-09 DIAGNOSIS — E1122 Type 2 diabetes mellitus with diabetic chronic kidney disease: Secondary | ICD-10-CM

## 2019-04-09 DIAGNOSIS — I10 Essential (primary) hypertension: Secondary | ICD-10-CM

## 2019-04-09 DIAGNOSIS — Z794 Long term (current) use of insulin: Secondary | ICD-10-CM

## 2019-04-09 NOTE — Chronic Care Management (AMB) (Signed)
Chronic Care Management Pharmacy  Name: Donald [name]  MRN: [id number] DOB: 09-19-1945  Chief Complaint/ HPI  Donald [name],  74 y.o., male presents for their Follow-Up CCM visit with the clinical pharmacist via telephone.  PCP : Donald Bush, MD  Their chronic conditions addressed today include: hypertension, type 2 diabetes, chronic gout   Patient concerns: going to orthopedic doctor on Monday for shoulder and back pain  Office Visits: none since last CCM on 03/12/19  Current Diagnosis/Assessment: Goals    . Pharmacy Care Plan     Current Barriers:  . Chronic Disease Management support, education, and care coordination needs related to hypertension, coronary artery disease, heart failure, allergic rhinitis, GERD, chronic kidney disease, type 2 diabetes, kidney stone, dyslipidemia, iron deficiency anemia, depression, chronic insomnia, chronic gout, constipation  Pharmacist Clinical Goal(s):  . Remain up to date on vaccinations. Recommend tetanus vaccine (Tdap). . Effectively treat gout flares. At first sign of a gout flare, take 2 colchicine followed by an additional tablet 1 hour later, then twice daily for duration of flare (usually 3-5 days). Do not exceed 3 tablets in 24 hours.  . Achieve glucose control with A1c less than 7%. Continue to monitor blood glucose twice daily. Fasting blood glucose goal 80-130 mg/dL; within 1-2 hours of meal goal: less than 180 mg/dL. Please call with any elevations or readings less than 70 mg/dL.  Interventions: . Comprehensive medication review performed.  Patient Self Care Activities:  . Self administers medications as prescribed  Please see past updates related to this goal by clicking on the "Past Updates" button in the selected goal         Diabetes  Sees Dr. Loanne [name] every 2-3 months   Recent Relevant Labs: Lab Results  Component Value Date/Time   HGBA1C 7.3 (A) 02/10/2019 01:20 PM   HGBA1C 7.9 (H) 10/15/2018 02:11  PM   HGBA1C 7.7 (A) 08/04/2018 03:24 PM   HGBA1C 8.3 (H) 03/18/2018 09:20 AM   MICROALBUR 12.0 (H) 03/02/2015 11:42 AM   MICROALBUR 2.6 (H) 01/27/2014 11:49 AM   A1c goal < 7% Checking BG: 1-2 times a day Hypoglycemia: denies Fasting blood glucose: 125-135  Usually eats 2 meals a day, only takes his insulin before meals Patient is currently uncontrolled on the following medications:   Humulin N (insulin NPH) - Inject 290 units qHS  Novolin R (insulin regular) - Inject TID before meals (210-150-290 units)  Last diabetic eye exam:  Lab Results  Component Value Date/Time   HMDIABEYEEXA Retinopathy (A) 05/21/2017 03:54 PM    Last diabetic foot exam:  Dr. Loanne [name] checks his feet and ankles at every appt   Plan: Continue current medications and close follow up with Dr. Loanne [name]. Fasting blood glucose is within goal and no hypoglycemia.   Hypertension   Office blood pressures are  BP Readings from Last 3 Encounters:  02/10/19 130/60  12/18/18 136/70  11/10/18 138/60   CMP Latest Ref Rng & Units 11/04/2018 10/28/2018 10/19/2018  Glucose 70 - 99 mg/dL 160(H) 132(H) 149(H)  BUN 6 - 23 mg/dL 25(H) 62(H) 33(H)  Creatinine 0.40 - 1.50 mg/dL 1.94(H) 2.51(H) 1.54(H)  Sodium 135 - 145 mEq/L 143 142 143  Potassium 3.5 - 5.1 mEq/L 4.6 4.4 3.6  Chloride 96 - 112 mEq/L 104 102 103  CO2 19 - 32 mEq/L 31 31 27   Calcium 8.4 - 10.5 mg/dL 9.1 8.9 9.6  Total Protein 6.0 - 8.3 g/dL - 6.1 -  Total Bilirubin 0.2 -  1.2 mg/dL - 1.1 -  Alkaline Phos 39 - 117 U/L - 80 -  AST 0 - 37 U/L - 10 -  ALT 0 - 53 U/L - 13 -   CrCl (adjbw): 44 mL/min   GFR (11/04/18): 34 mL/min Patient has failed these meds in the past: none  Patient checks BP at home: once a month or so Patient home BP readings are ranging: still good, around 130/60-65 mmHg  Patient is currently controlled on the following medications:   Amlodipine 5 mg - 1 tablet daily   Losartan 25 mg - 1 tablet daily at bedtime  We discussed: pt  resumed losartan 12/20, had labs done elsewhere but not in Epic yet  Plan: Continue current medications   Gout   Symptoms: left foot pain has resolved over the past week Recent flares: flare lasted several months 01/20 Uric acid (11/04/18): 5.0 Patient has tried these meds in past: Biofreeze - some relief Patient is currently controlled on the following medications:   Allopurinol 300 mg - 1 tablet daily after meal  Colchicine 0.6 mg - 1 tablet PRN every hours four gout flare, max 6 tabs/24 hour   OTC aspercreme (lidocaine), lidocaine patches - PRN  Capsaicin cream - apply 3-4 times daily for pain  CrCl > 30 ml/min, no dose adjustments recommended for renal function with allopurinol or colchicine.   Plan: Continue current medications. Recommend max dose of colchicine of 1.8 mg per day; 2 tablets at first sign of gout flare, then twice daily until resolved.   Medication Management  Pharmacy: [facility]  Adherence: pillbox  Affordability: did not report any concerns  Vaccines: Recommend Tdap   CCM Follow Up: August 06, 2019 at 3:30 PM (telephone)  Donald [name], PharmD Clinical Pharmacist [facility] at [facility] [phone]

## 2019-04-09 NOTE — Patient Instructions (Addendum)
April 09, 2019  Dear Jasper Loser Vogl,  Below is a summary of the goals we discussed at our telephone call on April 09, 2019. Please contact me anytime with questions or concerns.   Visit Information  Goals Addressed            This Visit's Progress   . Pharmacy Care Plan       Current Barriers:  . Chronic Disease Management support, education, and care coordination needs related to hypertension, coronary artery disease, heart failure, allergic rhinitis, GERD, chronic kidney disease, type 2 diabetes, kidney stone, dyslipidemia, iron deficiency anemia, depression, chronic insomnia, chronic gout, constipation  Pharmacist Clinical Goal(s):  . Remain up to date on vaccinations. Recommend tetanus vaccine (Tdap). . Effectively treat gout flares. At first sign of a gout flare, take 2 colchicine followed by an additional tablet 1 hour later, then twice daily for duration of flare (usually 3-5 days). Do not exceed 3 tablets in 24 hours.  . Achieve glucose control with A1c less than 7%. Continue to monitor blood glucose twice daily. Fasting blood glucose goal 80-130 mg/dL; within 1-2 hours of meal goal: less than 180 mg/dL. Please call with any elevations or readings less than 70 mg/dL.  Interventions: . Comprehensive medication review performed.  Patient Self Care Activities:  . Self administers medications as prescribed  Please see past updates related to this goal by clicking on the "Past Updates" button in the selected goal        The patient verbalized understanding of instructions provided today and agreed to receive a mailed copy of patient instruction and/or educational materials.  Telephone follow up appointment with pharmacy team member scheduled for: August 06, 2019 at 3:30 PM (telephone)  Debbora Dus, PharmD Clinical Pharmacist [facility] at [facility] [phone]

## 2019-04-12 DIAGNOSIS — M47812 Spondylosis without myelopathy or radiculopathy, cervical region: Secondary | ICD-10-CM | POA: Diagnosis not present

## 2019-04-12 DIAGNOSIS — M25511 Pain in right shoulder: Secondary | ICD-10-CM | POA: Diagnosis not present

## 2019-04-12 DIAGNOSIS — M542 Cervicalgia: Secondary | ICD-10-CM | POA: Diagnosis not present

## 2019-04-18 ENCOUNTER — Other Ambulatory Visit: Payer: Self-pay | Admitting: Family Medicine

## 2019-04-18 DIAGNOSIS — IMO0002 Reserved for concepts with insufficient information to code with codable children: Secondary | ICD-10-CM

## 2019-04-18 DIAGNOSIS — Z125 Encounter for screening for malignant neoplasm of prostate: Secondary | ICD-10-CM

## 2019-04-18 DIAGNOSIS — E538 Deficiency of other specified B group vitamins: Secondary | ICD-10-CM

## 2019-04-18 DIAGNOSIS — E785 Hyperlipidemia, unspecified: Secondary | ICD-10-CM

## 2019-04-18 DIAGNOSIS — Z794 Long term (current) use of insulin: Secondary | ICD-10-CM

## 2019-04-18 DIAGNOSIS — D510 Vitamin B12 deficiency anemia due to intrinsic factor deficiency: Secondary | ICD-10-CM

## 2019-04-18 DIAGNOSIS — E1142 Type 2 diabetes mellitus with diabetic polyneuropathy: Secondary | ICD-10-CM

## 2019-04-18 DIAGNOSIS — M1A9XX0 Chronic gout, unspecified, without tophus (tophi): Secondary | ICD-10-CM

## 2019-04-18 DIAGNOSIS — E559 Vitamin D deficiency, unspecified: Secondary | ICD-10-CM

## 2019-04-19 ENCOUNTER — Ambulatory Visit: Payer: Medicare Other

## 2019-04-19 ENCOUNTER — Other Ambulatory Visit: Payer: Medicare Other

## 2019-04-20 ENCOUNTER — Ambulatory Visit (INDEPENDENT_AMBULATORY_CARE_PROVIDER_SITE_OTHER): Payer: Medicare Other

## 2019-04-20 ENCOUNTER — Other Ambulatory Visit: Payer: Self-pay

## 2019-04-20 ENCOUNTER — Other Ambulatory Visit (INDEPENDENT_AMBULATORY_CARE_PROVIDER_SITE_OTHER): Payer: Medicare Other

## 2019-04-20 ENCOUNTER — Other Ambulatory Visit: Payer: Medicare Other

## 2019-04-20 VITALS — BP 130/65 | Wt 277.0 lb

## 2019-04-20 DIAGNOSIS — E1165 Type 2 diabetes mellitus with hyperglycemia: Secondary | ICD-10-CM | POA: Diagnosis not present

## 2019-04-20 DIAGNOSIS — N1832 Chronic kidney disease, stage 3b: Secondary | ICD-10-CM | POA: Diagnosis not present

## 2019-04-20 DIAGNOSIS — Z0000 Encounter for general adult medical examination without abnormal findings: Secondary | ICD-10-CM | POA: Diagnosis not present

## 2019-04-20 DIAGNOSIS — E538 Deficiency of other specified B group vitamins: Secondary | ICD-10-CM | POA: Diagnosis not present

## 2019-04-20 DIAGNOSIS — D510 Vitamin B12 deficiency anemia due to intrinsic factor deficiency: Secondary | ICD-10-CM

## 2019-04-20 DIAGNOSIS — E1142 Type 2 diabetes mellitus with diabetic polyneuropathy: Secondary | ICD-10-CM

## 2019-04-20 DIAGNOSIS — E1121 Type 2 diabetes mellitus with diabetic nephropathy: Secondary | ICD-10-CM

## 2019-04-20 DIAGNOSIS — E785 Hyperlipidemia, unspecified: Secondary | ICD-10-CM | POA: Diagnosis not present

## 2019-04-20 DIAGNOSIS — Z794 Long term (current) use of insulin: Secondary | ICD-10-CM

## 2019-04-20 DIAGNOSIS — Z125 Encounter for screening for malignant neoplasm of prostate: Secondary | ICD-10-CM

## 2019-04-20 DIAGNOSIS — M1A9XX0 Chronic gout, unspecified, without tophus (tophi): Secondary | ICD-10-CM

## 2019-04-20 LAB — COMPREHENSIVE METABOLIC PANEL
ALT: 19 U/L (ref 0–53)
AST: 15 U/L (ref 0–37)
Albumin: 4.3 g/dL (ref 3.5–5.2)
Alkaline Phosphatase: 80 U/L (ref 39–117)
BUN: 45 mg/dL — ABNORMAL HIGH (ref 6–23)
CO2: 36 mEq/L — ABNORMAL HIGH (ref 19–32)
Calcium: 9.6 mg/dL (ref 8.4–10.5)
Chloride: 99 mEq/L (ref 96–112)
Creatinine, Ser: 1.95 mg/dL — ABNORMAL HIGH (ref 0.40–1.50)
GFR: 34.05 mL/min — ABNORMAL LOW (ref >60.00)
Glucose, Bld: 96 mg/dL (ref 70–99)
Potassium: 5.2 mEq/L — ABNORMAL HIGH (ref 3.5–5.1)
Sodium: 144 mEq/L (ref 135–145)
Total Bilirubin: 1 mg/dL (ref 0.2–1.2)
Total Protein: 6.5 g/dL (ref 6.0–8.3)

## 2019-04-20 LAB — CBC WITH DIFFERENTIAL/PLATELET
Basophils Absolute: 0.1 10*3/uL (ref 0.0–0.1)
Basophils Relative: 0.6 % (ref 0.0–3.0)
Eosinophils Absolute: 0.3 10*3/uL (ref 0.0–0.7)
Eosinophils Relative: 3.6 % (ref 0.0–5.0)
HCT: 43.2 % (ref 39.0–52.0)
Hemoglobin: 14.1 g/dL (ref 13.0–17.0)
Lymphocytes Relative: 23.3 % (ref 12.0–46.0)
Lymphs Abs: 2.1 10*3/uL (ref 0.7–4.0)
MCHC: 32.7 g/dL (ref 30.0–36.0)
MCV: 89 fl (ref 78.0–100.0)
Monocytes Absolute: 0.7 10*3/uL (ref 0.1–1.0)
Monocytes Relative: 7.5 % (ref 3.0–12.0)
Neutro Abs: 5.9 10*3/uL (ref 1.4–7.7)
Neutrophils Relative %: 65 % (ref 43.0–77.0)
Platelets: 180 10*3/uL (ref 150.0–400.0)
RBC: 4.85 Mil/uL (ref 4.22–5.81)
RDW: 16.8 % — ABNORMAL HIGH (ref 11.5–15.5)
WBC: 9.1 10*3/uL (ref 4.0–10.5)

## 2019-04-20 LAB — LIPID PANEL
Cholesterol: 113 mg/dL (ref 0–200)
HDL: 32 mg/dL — ABNORMAL LOW (ref >39.00)
LDL Cholesterol: 59 mg/dL (ref 0–99)
NonHDL: 80.9
Total CHOL/HDL Ratio: 4
Triglycerides: 112 mg/dL (ref 0.0–149.0)
VLDL: 22.4 mg/dL (ref 0.0–40.0)

## 2019-04-20 LAB — VITAMIN B12: Vitamin B-12: 391 pg/mL (ref 211–911)

## 2019-04-20 LAB — HEMOGLOBIN A1C: Hgb A1c MFr Bld: 7.7 % — ABNORMAL HIGH (ref 4.6–6.5)

## 2019-04-20 LAB — URIC ACID: Uric Acid, Serum: 5.6 mg/dL (ref 4.0–7.8)

## 2019-04-20 LAB — PSA, MEDICARE: PSA: 0.7 ng/ml (ref 0.10–4.00)

## 2019-04-20 NOTE — Patient Instructions (Signed)
Donald [name] , Thank you for taking time to come for your Medicare Wellness Visit. I appreciate your ongoing commitment to your health goals. Please review the following plan we discussed and let me know if I can assist you in the future.   Screening recommendations/referrals: Colonoscopy: Cologuard due Recommended yearly ophthalmology/optometry visit for glaucoma screening and checkup Recommended yearly dental visit for hygiene and checkup  Vaccinations: Influenza vaccine: Up to date, completed 10/08/2018 Pneumococcal vaccine: Completed series Tdap vaccine: decline Shingles vaccine: discussed    Advanced directives: Please bring a copy of your POA (Power of Attorney) and/or Living Will to your next appointment.  Conditions/risks identified: diabetes, hypertension  Next appointment: 04/23/2019 @ 3 pm   Preventive Care 65 Years and Older, Male Preventive care refers to lifestyle choices and visits with your health care provider that can promote health and wellness. What does preventive care include?  A yearly physical exam. This is also called an annual well check.  Dental exams once or twice a year.  Routine eye exams. Ask your health care provider how often you should have your eyes checked.  Personal lifestyle choices, including:  Daily care of your teeth and gums.  Regular physical activity.  Eating a healthy diet.  Avoiding tobacco and drug use.  Limiting alcohol use.  Practicing safe sex.  Taking low doses of aspirin every day.  Taking vitamin and mineral supplements as recommended by your health care provider. What happens during an annual well check? The services and screenings done by your health care provider during your annual well check will depend on your age, overall health, lifestyle risk factors, and family history of disease. Counseling  Your health care provider may ask you questions about your:  Alcohol use.  Tobacco use.  Drug use.  Emotional  well-being.  Home and relationship well-being.  Sexual activity.  Eating habits.  History of falls.  Memory and ability to understand (cognition).  Work and work Statistician. Screening  You may have the following tests or measurements:  Height, weight, and BMI.  Blood pressure.  Lipid and cholesterol levels. These may be checked every 5 years, or more frequently if you are over 42 years old.  Skin check.  Lung cancer screening. You may have this screening every year starting at age 19 if you have a 30-pack-year history of smoking and currently smoke or have quit within the past 15 years.  Fecal occult blood test (FOBT) of the stool. You may have this test every year starting at age 18.  Flexible sigmoidoscopy or colonoscopy. You may have a sigmoidoscopy every 5 years or a colonoscopy every 10 years starting at age 64.  Prostate cancer screening. Recommendations will vary depending on your family history and other risks.  Hepatitis C blood test.  Hepatitis B blood test.  Sexually transmitted disease (STD) testing.  Diabetes screening. This is done by checking your blood sugar (glucose) after you have not eaten for a while (fasting). You may have this done every 1-3 years.  Abdominal aortic aneurysm (AAA) screening. You may need this if you are a current or former smoker.  Osteoporosis. You may be screened starting at age 47 if you are at high risk. Talk with your health care provider about your test results, treatment options, and if necessary, the need for more tests. Vaccines  Your health care provider may recommend certain vaccines, such as:  Influenza vaccine. This is recommended every year.  Tetanus, diphtheria, and acellular pertussis (Tdap, Td) vaccine.  You may need a Td booster every 10 years.  Zoster vaccine. You may need this after age 64.  Pneumococcal 13-valent conjugate (PCV13) vaccine. One dose is recommended after age 60.  Pneumococcal  polysaccharide (PPSV23) vaccine. One dose is recommended after age 23. Talk to your health care provider about which screenings and vaccines you need and how often you need them. This information is not intended to replace advice given to you by your health care provider. Make sure you discuss any questions you have with your health care provider. Document Released: 01/27/2015 Document Revised: 09/20/2015 Document Reviewed: 11/01/2014 Elsevier Interactive Patient Education  2017 [employer] Prevention in the Home Falls can cause injuries. They can happen to people of all ages. There are many things you can do to make your home safe and to help prevent falls. What can I do on the outside of my home?  Regularly fix the edges of walkways and driveways and fix any cracks.  Remove anything that might make you trip as you walk through a door, such as a raised step or threshold.  Trim any bushes or trees on the path to your home.  Use bright outdoor lighting.  Clear any walking paths of anything that might make someone trip, such as rocks or tools.  Regularly check to see if handrails are loose or broken. Make sure that both sides of any steps have handrails.  Any raised decks and porches should have guardrails on the edges.  Have any leaves, snow, or ice cleared regularly.  Use sand or salt on walking paths during winter.  Clean up any spills in your garage right away. This includes oil or grease spills. What can I do in the bathroom?  Use night lights.  Install grab bars by the toilet and in the tub and shower. Do not use towel bars as grab bars.  Use non-skid mats or decals in the tub or shower.  If you need to sit down in the shower, use a plastic, non-slip stool.  Keep the floor dry. Clean up any water that spills on the floor as soon as it happens.  Remove soap buildup in the tub or shower regularly.  Attach bath mats securely with double-sided non-slip rug  tape.  Do not have throw rugs and other things on the floor that can make you trip. What can I do in the bedroom?  Use night lights.  Make sure that you have a light by your bed that is easy to reach.  Do not use any sheets or blankets that are too big for your bed. They should not hang down onto the floor.  Have a firm chair that has side arms. You can use this for support while you get dressed.  Do not have throw rugs and other things on the floor that can make you trip. What can I do in the kitchen?  Clean up any spills right away.  Avoid walking on wet floors.  Keep items that you use a lot in easy-to-reach places.  If you need to reach something above you, use a strong step stool that has a grab bar.  Keep electrical cords out of the way.  Do not use floor polish or wax that makes floors slippery. If you must use wax, use non-skid floor wax.  Do not have throw rugs and other things on the floor that can make you trip. What can I do with my stairs?  Do not leave any  items on the stairs.  Make sure that there are handrails on both sides of the stairs and use them. Fix handrails that are broken or loose. Make sure that handrails are as long as the stairways.  Check any carpeting to make sure that it is firmly attached to the stairs. Fix any carpet that is loose or worn.  Avoid having throw rugs at the top or bottom of the stairs. If you do have throw rugs, attach them to the floor with carpet tape.  Make sure that you have a light switch at the top of the stairs and the bottom of the stairs. If you do not have them, ask someone to add them for you. What else can I do to help prevent falls?  Wear shoes that:  Do not have high heels.  Have rubber bottoms.  Are comfortable and fit you well.  Are closed at the toe. Do not wear sandals.  If you use a stepladder:  Make sure that it is fully opened. Do not climb a closed stepladder.  Make sure that both sides of the  stepladder are locked into place.  Ask someone to hold it for you, if possible.  Clearly mark and make sure that you can see:  Any grab bars or handrails.  First and last steps.  Where the edge of each step is.  Use tools that help you move around (mobility aids) if they are needed. These include:  Canes.  Walkers.  Scooters.  Crutches.  Turn on the lights when you go into a dark area. Replace any light bulbs as soon as they burn out.  Set up your furniture so you have a clear path. Avoid moving your furniture around.  If any of your floors are uneven, fix them.  If there are any pets around you, be aware of where they are.  Review your medicines with your doctor. Some medicines can make you feel dizzy. This can increase your chance of falling. Ask your doctor what other things that you can do to help prevent falls. This information is not intended to replace advice given to you by your health care provider. Make sure you discuss any questions you have with your health care provider. Document Released: 10/27/2008 Document Revised: 06/08/2015 Document Reviewed: 02/04/2014 Elsevier Interactive Patient Education  2017 [employer].

## 2019-04-20 NOTE — Progress Notes (Signed)
Subjective:   Donald [name] is a 74 y.o. male who presents for Medicare Annual/Subsequent preventive examination.  Review of Systems: N/A   This visit is being conducted through telemedicine via telephone at the nurse health advisor's home address due to the COVID-19 pandemic. This patient has given me verbal consent via doximity to conduct this visit, patient states they are participating from their home address. Patient and myself are on the telephone call. There is no referral for this visit. Some vital signs may be absent or patient reported.    Patient identification: identified by name, DOB, and current address   Cardiac Risk Factors include: advanced age (>36men, >84 women);male gender;diabetes mellitus;hypertension     Objective:    Vitals: BP 130/65   Wt 277 lb (125.6 kg)   BMI 45.39 kg/m   Body mass index is 45.39 kg/m.  Advanced Directives 04/20/2019 10/19/2018 10/15/2018 01/15/2018 02/10/2017 04/18/2016 04/18/2016  Does Patient Have a Medical Advance Directive? Yes Yes Yes Yes No Yes Yes  Type of Paramedic of [place];Living will [place];Living will [place];Living will [place];Living will - [place] -  Does patient want to make changes to medical advance directive? - No - Patient declined No - Patient declined - - No - Patient declined No - Patient declined  Copy of [place] in Chart? No - copy requested No - copy requested No - copy requested - - - -    Tobacco Social History   Tobacco Use  Smoking Status Never Smoker  Smokeless Tobacco Never Used     Counseling given: Not Answered   Clinical Intake:  Pre-visit preparation completed: Yes  Pain : 0-10 Pain Score: 5  Pain Type: Chronic pain Pain Location: Shoulder Pain Orientation: Right Pain Descriptors / Indicators: Aching Pain Onset: More than a month ago Pain Frequency:  Intermittent Pain Relieving Factors: heat, pain relieving cream, prednisone  Pain Relieving Factors: heat, pain relieving cream, prednisone  Nutritional Risks: None Diabetes: Yes CBG done?: No Did pt. bring in CBG monitor from home?: No  How often do you need to have someone help you when you read instructions, pamphlets, or other written materials from your doctor or pharmacy?: 1 - Never What is the last grade level you completed in school?: 2 years of college  Interpreter Needed?: No  Information entered by :: CJohnson, LPN  Past Medical History:  Diagnosis Date  . (HFpEF) heart failure with preserved ejection fraction ([place])    a. 09/2018 Echo: EF 60-65%. PASP 67mmHg. Mild-mod LAE. Mild MR/TR.  Marland Kitchen ANEMIA-IRON DEFICIENCY 07/26/2006  . Anxiety   . ASTHMA 07/26/2006  . Asthma   . Back pain, chronic   . Bell's palsy   . CKD (chronic kidney disease), stage III   . COLONIC POLYPS, HX OF 07/26/2006  . DEPRESSION 03/14/2009  . DIABETES MELLITUS, TYPE II 07/26/2006  . [place] DISEASE, LUMBAR 10/05/2007  . DVT 12/03/2007  . DYSLIPIDEMIA 04/13/2009  . Dyspnea    when gets up and walks around and back is hurting really bad -only Shortness of breath  then  . GERD 07/26/2006  . Gout   . Heart murmur   . History of kidney stones   . HYPERTENSION 07/26/2006  . INSOMNIA 08/21/2007  . Neuropathy   . Nonobstructive CAD (coronary artery disease)    a. 2012 Cath: no high grade stenosis; b. 2018 MV: No ischemia. Attenuation artifact.   Marland Kitchen  OBSTRUCTIVE SLEEP APNEA 12/03/2007   Use C-PAP  . PERIPHERAL NEUROPATHY 07/26/2006  . Pernicious anemia 11/20/2006  . PULMONARY EMBOLISM 10/05/2007   Past Surgical History:  Procedure Laterality Date  . APPENDECTOMY  1968  . [place], 06/14/2008   Dr[facility] Trenton Gammon [facility] (06/10)  . CARDIAC CATHETERIZATION    . CARPAL TUNNEL RELEASE Right 10/2017  . CATARACT EXTRACTION W/PHACO Left 05/30/2014   Procedure: CATARACT EXTRACTION PHACO AND INTRAOCULAR LENS  PLACEMENT (IOC);  Surgeon: Estill Cotta, MD;  Location: [facility] ORS;  Service: Ophthalmology;  Laterality: Left;  [name] 01:20 AP% 23.7 CDE 31.86  . COLONOSCOPY  10/2002   HP, SSA, TA, rpt 3 yrs ([place])  . COLONOSCOPY  01/2005   diverticulosis rpt 5 yrs ([place])   . IR NEPHROSTOMY PLACEMENT LEFT  10/19/2018  . IR URETERAL STENT LEFT NEW ACCESS W/O SEP NEPHROSTOMY CATH  10/19/2018  . LITHOTRIPSY     X 2  . NEPHROLITHOTOMY Left 10/19/2018   Procedure: NEPHROLITHOTOMY PERCUTANEOUS;  Surgeon: Franchot Gallo, MD;  Location: [facility] ORS;  Service: Urology;  Laterality: Left;  3 HRS  . PARATHYROIDECTOMY Right 04/18/2016   PARATHYROIDECTOMY for cyst [name], MD)  . SHOULDER ARTHROSCOPY W/ ROTATOR CUFF REPAIR Right   . TONSILLECTOMY     Family History  Problem Relation Age of Onset  . Cancer Mother        Breast Cancer  . Cancer Sister        Breast Cancer  . Diabetes Father   . Heart disease Father   . Diabetes Paternal Grandfather    Social History   Socioeconomic History  . Marital status: Married    Spouse name: Not on file  . Number of children: Not on file  . Years of education: Not on file  . Highest education level: Not on file  Occupational History  . Occupation: Disabled    Employer: [employer]  Tobacco Use  . Smoking status: Never Smoker  . Smokeless tobacco: Never Used  Substance and Sexual Activity  . Alcohol use: No  . Drug use: No  . Sexual activity: Not on file  Other Topics Concern  . 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   Activity is severely limited by medical problems   Diet is "good".   Never a smoker   Alcohol: none         Social Determinants of Health   Financial Resource Strain: Low Risk   . Difficulty of Paying Living Expenses: Not hard at all  Food Insecurity: No Food Insecurity  . Worried About Charity fundraiser in the Last Year: Never true  . Ran Out of  Food in the Last Year: Never true  Transportation Needs: No Transportation Needs  . Lack of Transportation (Medical): No  . Lack of Transportation (Non-Medical): No  Physical Activity: Inactive  . Days of Exercise per Week: 0 days  . Minutes of Exercise per Session: 0 min  Stress: No Stress Concern Present  . Feeling of Stress : Not at all  Social Connections:   . Frequency of Communication with Friends and Family:   . Frequency of Social Gatherings with Friends and Family:   . Attends Religious Services:   . Active Member of Clubs or Organizations:   . Attends Archivist Meetings:   Marland Kitchen Marital Status:     Outpatient Encounter Medications as of  04/20/2019  Medication Sig  . allopurinol (ZYLOPRIM) 300 MG tablet TAKE ONE TABLET EVERY DAY  . ALPRAZolam (XANAX) 1 MG tablet TAKE ONE TABLET AT BEDTIME AS NEEDED FORSLEEP  . aspirin EC 81 MG tablet Take 1 tablet (81 mg total) by mouth daily.  . colchicine (COLCRYS) 0.6 MG tablet TAKE 1 TABLET BY MOUTH EVERY HOUR UNTIL SYMPTOMS ARE BETTER, MAX OF 6TABS PER DAY. STOP IF DIARRHEA  . cyanocobalamin (,VITAMIN B-12,) 1000 MCG/ML injection Inject 1,000 mcg into the muscle every 30 (thirty) days.  . ferrous sulfate 324 (65 Fe) MG TBEC Take 1 tablet (325 mg total) by mouth daily.  . fluticasone (FLONASE) 50 MCG/ACT nasal spray Place 2 sprays into both nostrils daily.  . furosemide (LASIX) 80 MG tablet TAKE ONE TABLET (80 MG) BY MOUTH EVERY DAY  . gabapentin (NEURONTIN) 600 MG tablet TAKE TWO TABLETS (1200MG ) BY MOUTH AT BEDTIME  . GE100 BLOOD GLUCOSE TEST test strip TEST FOUR TIMES DAILY  . HYDROcodone-acetaminophen (NORCO) 10-325 MG tablet Take 1 tablet by mouth every 6 (six) hours as needed for moderate pain ((typically once to twice daily if needed)).   Marland Kitchen insulin NPH Human (HUMULIN N) 100 UNIT/ML injection Inject 2.9 mLs (290 Units total) into the skin at bedtime.  . insulin regular (NOVOLIN R RELION) 100 units/mL injection 3 times a day  (just before each meal), 210-150-290 units, and syringes 4/day.  . losartan (COZAAR) 25 MG tablet TAKE ONE TABLET AT BEDTIME  . lovastatin (MEVACOR) 40 MG tablet TAKE 2 TABLETS BY MOUTH (80MG  TOTAL) AT BEDTIME  . Naphazoline-Pheniramine (OPCON-A OP) Place 1 drop into both eyes 3 (three) times daily as needed (itchy eyes.).   Marland Kitchen ondansetron (ZOFRAN) 4 MG tablet Take 1 tablet (4 mg total) by mouth every 4 (four) hours as needed for nausea.  . Potassium Citrate 15 MEQ (1620 MG) TBCR Take 1 tablet by mouth 3 (three) times daily.  [name] Sodium (STOOL SOFTENER & LAXATIVE PO) Take 2 tablets by mouth at bedtime.  Marland Kitchen amLODipine (NORVASC) 5 MG tablet Take 1 tablet (5 mg total) by mouth daily.   No facility-administered encounter medications on file as of 04/20/2019.    Activities of Daily Living In your present state of health, do you have any difficulty performing the following activities: 04/20/2019 10/19/2018  Hearing? Tempie Donning  Comment wears hearing aids -  Vision? N N  Difficulty concentrating or making decisions? N N  Walking or climbing stairs? N N  Dressing or bathing? N Y  Doing errands, shopping? N N  Preparing Food and eating ? N -  Using the Toilet? N -  In the past six months, have you accidently leaked urine? N -  Do you have problems with loss of bowel control? N -  Managing your Medications? N -  Managing your Finances? N -  Housekeeping or managing your Housekeeping? N -  Some recent data might be hidden    Patient Care Team: Ria Bush, MD as PCP - General (Family Medicine) Rockey Situ Kathlene November, MD as PCP - Cardiology (Cardiology) Suella Broad, MD (Physical Medicine and Rehabilitation) Earnie Larsson, MD as Attending Physician (Neurosurgery) Rigoberto Noel, MD as Consulting Physician (Pulmonary Disease) Dingeldein, Remo Lipps, MD (Ophthalmology) Minna Merritts, MD as Consulting Physician (Cardiology) Debbora Dus, [facility] as Pharmacist (Pharmacist)   Assessment:     This is a routine wellness examination for [place].  Exercise Activities and Dietary recommendations Current Exercise Habits: The patient does not participate in regular  exercise at present, Exercise limited by: orthopedic condition(s)  Goals    . Patient Stated     04/20/2019, I will maintain and continue medications as prescribed.     . Pharmacy Care Plan     Current Barriers:  . Chronic Disease Management support, education, and care coordination needs related to hypertension, coronary artery disease, heart failure, allergic rhinitis, GERD, chronic kidney disease, type 2 diabetes, kidney stone, dyslipidemia, iron deficiency anemia, depression, chronic insomnia, chronic gout, constipation  Pharmacist Clinical Goal(s):  . Remain up to date on vaccinations. Recommend tetanus vaccine (Tdap). . Effectively treat gout flares. At first sign of a gout flare, take 2 colchicine followed by an additional tablet 1 hour later, then twice daily for duration of flare (usually 3-5 days). Do not exceed 3 tablets in 24 hours.  . Achieve glucose control with A1c less than 7%. Continue to monitor blood glucose twice daily. Fasting blood glucose goal 80-130 mg/dL; within 1-2 hours of meal goal: less than 180 mg/dL. Please call with any elevations or readings less than 70 mg/dL.  Interventions: . Comprehensive medication review performed.  Patient Self Care Activities:  . Self administers medications as prescribed  Please see past updates related to this goal by clicking on the "Past Updates" button in the selected goal         Fall Risk Fall Risk  04/20/2019 03/20/2018 05/20/2014  Falls in the past year? 0 1 No  Number falls in past yr: 0 0 -  Injury with Fall? 0 0 -  Risk for fall due to : Medication side effect - -  Follow up Falls evaluation completed;Falls prevention discussed - -   Is the patient's home free of loose throw rugs in walkways, pet beds, electrical cords, etc?   yes      Grab bars in  the bathroom? no      Handrails on the stairs?   no      Adequate lighting?   yes  Timed Get Up and Go Performed: N/A  Depression Screen PHQ 2/9 Scores 04/20/2019 03/20/2018 05/20/2014  PHQ - 2 Score 0 0 0  PHQ- 9 Score 0 - -    Cognitive Function MMSE - Mini Mental State Exam 04/20/2019  Orientation to time 5  Orientation to Place 5  Registration 3  Attention/ Calculation 5  Recall 3  Language- repeat 1       Mini Cog  Mini-Cog screen was completed. Maximum score is 22. A value of 0 denotes this part of the MMSE was not completed or the patient failed this part of the Mini-Cog screening.  Immunization History  Administered Date(s) Administered  . Influenza Split 10/05/2010, 10/15/2011  . Influenza Whole 10/05/2007, 10/28/2008, 10/03/2009  . Influenza, High Dose Seasonal PF 10/21/2017  . Influenza,inj,Quad PF,6+ Mos 10/08/2012, 09/17/2013, 10/03/2014, 10/09/2015, 10/08/2018  . Influenza,inj,quad, With Preservative 10/14/2016, 10/15/2018  . Influenza-Unspecified 10/22/2016  . Pneumococcal Conjugate-13 08/30/2014  . Pneumococcal Polysaccharide-23 11/15/2002, 09/15/2007, 03/20/2018  . Td 04/21/2008  . Zoster 10/18/2009, 06/24/2011    Qualifies for Shingles Vaccine: Yes  Screening Tests Health Maintenance  Topic Date Due  . DTaP/Tdap/Td (2 - Tdap) 04/22/2018  . OPHTHALMOLOGY EXAM  05/22/2018  . FOOT EXAM  10/22/2018  . Fecal DNA (Cologuard)  02/14/2019  . DTAP VACCINES (1) 04/19/2024 (Originally 02/29/1946)  . TETANUS/TDAP  04/19/2024 (Originally 04/22/2018)  . HEMOGLOBIN A1C  08/10/2019  . INFLUENZA VACCINE  08/15/2019  . Hepatitis C Screening  Completed  . PNA vac  Low Risk Adult  Completed   Cancer Screenings: Lung: Low Dose CT Chest recommended if Age 15-80 years, 30 pack-year currently smoking OR have quit w/in 15 years. Patient does not qualify. Colorectal: Cologuard due   Additional Screenings:  Hepatitis C Screening: 02/10/2017       Plan:   Patient will  maintain and continue medications as prescribed.   I have personally reviewed and noted the following in the patient's chart:   . Medical and social history . Use of alcohol, tobacco or illicit drugs  . Current medications and supplements . Functional ability and status . Nutritional status . Physical activity . Advanced directives . List of other physicians . Hospitalizations, surgeries, and ER visits in previous 12 months . Vitals . Screenings to include cognitive, depression, and falls . Referrals and appointments  In addition, I have reviewed and discussed with patient certain preventive protocols, quality metrics, and best practice recommendations. A written personalized care plan for preventive services as well as general preventive health recommendations were provided to patient.     Andrez Grime, LPN  07/17/1285

## 2019-04-20 NOTE — Progress Notes (Signed)
PCP notes:  Health Maintenance: Cologuard due Eye exam- Patient will schedule he states Tdap- insurance/financial   Abnormal Screenings: none   Patient concerns: none   Nurse concerns: none   Next PCP appt.: 04/23/2019 @ 3 pm

## 2019-04-22 ENCOUNTER — Other Ambulatory Visit: Payer: Self-pay | Admitting: Family Medicine

## 2019-04-23 ENCOUNTER — Encounter: Payer: Self-pay | Admitting: Family Medicine

## 2019-04-23 ENCOUNTER — Other Ambulatory Visit: Payer: Self-pay

## 2019-04-23 ENCOUNTER — Ambulatory Visit (INDEPENDENT_AMBULATORY_CARE_PROVIDER_SITE_OTHER): Payer: Medicare Other | Admitting: Family Medicine

## 2019-04-23 VITALS — BP 136/56 | HR 69 | Temp 97.6°F | Ht 64.0 in | Wt 281.2 lb

## 2019-04-23 DIAGNOSIS — E875 Hyperkalemia: Secondary | ICD-10-CM

## 2019-04-23 DIAGNOSIS — E1122 Type 2 diabetes mellitus with diabetic chronic kidney disease: Secondary | ICD-10-CM | POA: Diagnosis not present

## 2019-04-23 DIAGNOSIS — N200 Calculus of kidney: Secondary | ICD-10-CM

## 2019-04-23 DIAGNOSIS — D510 Vitamin B12 deficiency anemia due to intrinsic factor deficiency: Secondary | ICD-10-CM | POA: Diagnosis not present

## 2019-04-23 DIAGNOSIS — E785 Hyperlipidemia, unspecified: Secondary | ICD-10-CM | POA: Diagnosis not present

## 2019-04-23 DIAGNOSIS — I5032 Chronic diastolic (congestive) heart failure: Secondary | ICD-10-CM

## 2019-04-23 DIAGNOSIS — E1142 Type 2 diabetes mellitus with diabetic polyneuropathy: Secondary | ICD-10-CM | POA: Diagnosis not present

## 2019-04-23 DIAGNOSIS — Z86718 Personal history of other venous thrombosis and embolism: Secondary | ICD-10-CM | POA: Diagnosis not present

## 2019-04-23 DIAGNOSIS — IMO0002 Reserved for concepts with insufficient information to code with codable children: Secondary | ICD-10-CM

## 2019-04-23 DIAGNOSIS — E538 Deficiency of other specified B group vitamins: Secondary | ICD-10-CM

## 2019-04-23 DIAGNOSIS — G4733 Obstructive sleep apnea (adult) (pediatric): Secondary | ICD-10-CM | POA: Diagnosis not present

## 2019-04-23 DIAGNOSIS — I10 Essential (primary) hypertension: Secondary | ICD-10-CM

## 2019-04-23 DIAGNOSIS — D509 Iron deficiency anemia, unspecified: Secondary | ICD-10-CM | POA: Diagnosis not present

## 2019-04-23 DIAGNOSIS — E1165 Type 2 diabetes mellitus with hyperglycemia: Secondary | ICD-10-CM

## 2019-04-23 DIAGNOSIS — Z7189 Other specified counseling: Secondary | ICD-10-CM | POA: Diagnosis not present

## 2019-04-23 DIAGNOSIS — R011 Cardiac murmur, unspecified: Secondary | ICD-10-CM

## 2019-04-23 DIAGNOSIS — M1A9XX0 Chronic gout, unspecified, without tophus (tophi): Secondary | ICD-10-CM

## 2019-04-23 DIAGNOSIS — N183 Chronic kidney disease, stage 3 unspecified: Secondary | ICD-10-CM

## 2019-04-23 DIAGNOSIS — I739 Peripheral vascular disease, unspecified: Secondary | ICD-10-CM

## 2019-04-23 NOTE — Telephone Encounter (Signed)
ERx 

## 2019-04-23 NOTE — Assessment & Plan Note (Signed)
Advanced directive discussion - has at home. HCPOA would be wife. Asked to bring [name] a copy.

## 2019-04-23 NOTE — Progress Notes (Signed)
This visit was conducted in person.  BP (!) 136/56 (BP Location: Right Arm, Patient Position: Sitting, Cuff Size: Large)   Pulse 69   Temp 97.6 F (36.4 C) (Temporal)   Ht 5\' 4"  (1.626 m)   Wt 281 lb 4 oz (127.6 kg)   SpO2 94%   BMI 48.28 kg/m    CC: AMW f/u visit  Subjective:    Patient ID: Donald [name], male    DOB: 02-25-1945, 74 y.o.   MRN: [id number]  HPI: ZACHERIE HONEYMAN is a 74 y.o. male presenting on 04/23/2019 for Annual Exam (Prt 2. )   Saw health advisor earlier this week for medicare wellness visit. Note reviewed.    No exam data present    Clinical Support from 04/20/2019 in [facility] [facility] at [facility] Total Score  0      Fall Risk  04/20/2019 03/20/2018 05/20/2014  Falls in the past year? 0 1 No  Number falls in past yr: 0 0 -  Injury with Fall? 0 0 -  Risk for fall due to : Medication side effect - -  Follow up Falls evaluation completed;Falls prevention discussed - -    On potassium citrate 75mEq 5 times a day for possible uric acid stone - through urology Dr Diona Fanti.   Seeing [facility] - recently started on oral prednisone for shoulder pain presumed stemming from neck.   Preventative: Colon cancer screening - colonoscopy 2006 with hyperplastic polyps ([place]). Cologuard normal 01/2016. agrees to repeat this.  Prostate cancer screening - yearly PSA. no BPH symptoms.  Lung cancer screening - not eligible  Flu shot - yearly Td 04/2008  Pneumovax 2009, 2004, 2020. prevnar 2016 Zostavax - received twice 2011, 2013 Shingrix - discussed - to discuss with pharmacist COVID vaccine - completed [place] 02/2019. Advanced directive discussion - has at home. HCPOA would be wife. Asked to bring [name] a copy.  Seat belt use discussed.  Sunscreen use discussed. No changing moles on skin. Sees Dr Evorn Gong.  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 Activity: limited by medical problems Diet: good water, fruits/vegetables daily     Relevant past medical, surgical, family and social history reviewed and updated as indicated. Interim medical history since our last visit reviewed. Allergies and medications reviewed and updated. Outpatient Medications Prior to Visit  Medication Sig Dispense Refill  . allopurinol (ZYLOPRIM) 300 MG tablet TAKE ONE TABLET EVERY DAY 30 tablet 2  . ALPRAZolam (XANAX) 1 MG tablet TAKE ONE TABLET AT BEDTIME IF NEEDED FORSLEEP 30 tablet 0  . amLODipine (NORVASC) 5 MG tablet Take 1 tablet (5 mg total) by mouth daily. 90 tablet 3  . aspirin EC 81 MG tablet Take 1 tablet (81 mg total) by mouth daily.    . colchicine (COLCRYS) 0.6 MG tablet TAKE 1 TABLET BY MOUTH EVERY HOUR UNTIL SYMPTOMS ARE BETTER, MAX OF 6TABS PER DAY. STOP IF DIARRHEA 30 tablet 4  . cyanocobalamin (,VITAMIN B-12,) 1000 MCG/ML injection Inject 1,000 mcg into the muscle every 30 (thirty) days.    . ferrous sulfate 324 (65 Fe) MG TBEC Take 1 tablet (325 mg total) by mouth daily. 30 tablet   . fluticasone (FLONASE) 50 MCG/ACT nasal spray Place 2 sprays into both nostrils daily.  16 g 1  . furosemide (LASIX) 80 MG tablet TAKE ONE TABLET (80 MG) BY MOUTH EVERY DAY 90 tablet 3  . gabapentin (NEURONTIN) 600 MG tablet TAKE TWO TABLETS (1200MG ) BY MOUTH AT BEDTIME 180 tablet 3  . GE100 BLOOD GLUCOSE TEST test strip TEST FOUR TIMES DAILY 200 each 1  . HYDROcodone-acetaminophen (NORCO) 10-325 MG tablet Take 1 tablet by mouth every 6 (six) hours as needed for moderate pain ((typically once to twice daily if needed)).  30 tablet 0  . insulin NPH Human (HUMULIN N) 100 UNIT/ML injection Inject 2.9 mLs (290 Units total) into the skin at bedtime. 90 mL 5  . insulin regular (NOVOLIN R RELION) 100 units/mL injection 3 times a day (just before each  meal), 210-150-290 units, and syringes 4/day. 210 mL 0  . losartan (COZAAR) 25 MG tablet TAKE ONE TABLET AT BEDTIME 90 tablet 0  . lovastatin (MEVACOR) 40 MG tablet TAKE 2 TABLETS BY MOUTH (80MG  TOTAL) AT BEDTIME 180 tablet 3  . Naphazoline-Pheniramine (OPCON-A OP) Place 1 drop into both eyes 3 (three) times daily as needed (itchy eyes.).     Marland Kitchen ondansetron (ZOFRAN) 4 MG tablet Take 1 tablet (4 mg total) by mouth every 4 (four) hours as needed for nausea. 10 tablet 0  . Potassium Citrate 15 MEQ (1620 MG) TBCR Take 1 tablet by mouth in the morning, at noon, in the evening, and at bedtime.    [name] Sodium (STOOL SOFTENER & LAXATIVE PO) Take 2 tablets by mouth at bedtime.    . Potassium Citrate 15 MEQ (1620 MG) TBCR Take 1 tablet by mouth 3 (three) times daily. Takes 1 tablet 2 times daily     No facility-administered medications prior to visit.     Per HPI unless specifically indicated in ROS section below Review of Systems Objective:    BP (!) 136/56 (BP Location: Right Arm, Patient Position: Sitting, Cuff Size: Large)   Pulse 69   Temp 97.6 F (36.4 C) (Temporal)   Ht 5\' 4"  (1.626 m)   Wt 281 lb 4 oz (127.6 kg)   SpO2 94%   BMI 48.28 kg/m   Wt Readings from Last 3 Encounters:  04/23/19 281 lb 4 oz (127.6 kg)  04/20/19 277 lb (125.6 kg)  02/10/19 283 lb 3.2 oz (128.5 kg)    Physical Exam Vitals and nursing note reviewed.  Constitutional:      General: He is not in acute distress.    Appearance: Normal appearance. He is well-developed. He is obese. He is not ill-appearing.  HENT:     Head: Normocephalic and atraumatic.     Right Ear: Hearing, tympanic membrane, ear canal and external ear normal.     Left Ear: Hearing, tympanic membrane, ear canal and external ear normal.  Eyes:     General: No scleral icterus.    Extraocular Movements: Extraocular movements intact.     Conjunctiva/sclera: Conjunctivae normal.     Pupils: Pupils are equal, round, and reactive to  light.  Cardiovascular:     Rate and Rhythm: Normal rate and regular rhythm.     Pulses: Normal pulses.          Radial pulses are 2+ on the right side and 2+ on the left side.     Heart sounds: Murmur (3/6 systolic) present.  Pulmonary:     Effort: Pulmonary effort is normal. No respiratory distress.     Breath sounds: Normal breath sounds. No wheezing, rhonchi or rales.  Abdominal:     General: Abdomen is flat. Bowel sounds are normal. There is no distension.     Palpations: Abdomen is soft. There is no mass.     Tenderness: There is no abdominal tenderness. There is no guarding or rebound.     Hernia: No hernia is present.  Musculoskeletal:        General: Normal range of motion.     Cervical back: Normal range of motion and neck supple.     Right lower leg: Edema (tr) present.     Left lower leg: Edema (tr) present.  Lymphadenopathy:     Cervical: No cervical adenopathy.  Skin:    General: Skin is warm and dry.     Findings: No rash.  Neurological:     General: No focal deficit present.     Mental Status: He is alert and oriented to person, place, and time.     Comments: CN grossly intact, station and gait intact  Psychiatric:        Mood and Affect: Mood normal.        Behavior: Behavior normal.        Thought Content: Thought content normal.        Judgment: Judgment normal.       Results for orders placed or performed in visit on 04/20/19  PSA, Medicare  Result Value Ref Range   PSA 0.70 0.10 - 4.00 ng/ml  Uric acid  Result Value Ref Range   Uric Acid, Serum 5.6 4.0 - 7.8 mg/dL  Vitamin B12  Result Value Ref Range   Vitamin B-12 391 211 - 911 pg/mL  CBC with Differential/Platelet  Result Value Ref Range   WBC 9.1 4.0 - 10.5 K/uL   RBC 4.85 4.22 - 5.81 Mil/uL   Hemoglobin 14.1 13.0 - 17.0 g/dL   HCT 43.2 39.0 - 52.0 %   MCV 89.0 78.0 - 100.0 fl   MCHC 32.7 30.0 - 36.0 g/dL   RDW 16.8 (H) 11.5 - 15.5 %   Platelets 180.0 150.0 - 400.0 K/uL   Neutrophils  Relative % 65.0 43.0 - 77.0 %   Lymphocytes Relative 23.3 12.0 - 46.0 %   Monocytes Relative 7.5 3.0 - 12.0 %   Eosinophils Relative 3.6 0.0 - 5.0 %   Basophils Relative 0.6 0.0 - 3.0 %   Neutro Abs 5.9 1.4 - 7.7 K/uL   Lymphs Abs 2.1 0.7 - 4.0 K/uL   Monocytes Absolute 0.7 0.1 - 1.0 K/uL   Eosinophils Absolute 0.3 0.0 - 0.7 K/uL   Basophils Absolute 0.1 0.0 - 0.1 K/uL  Hemoglobin A1c  Result Value Ref Range   Hgb A1c MFr Bld 7.7 (H) 4.6 - 6.5 %  Comprehensive metabolic panel  Result Value Ref Range   Sodium 144 135 - 145 mEq/L   Potassium 5.2 (H) 3.5 - 5.1 mEq/L   Chloride 99 96 - 112 mEq/L   CO2 36 (H) 19 - 32 mEq/L   Glucose, Bld 96 70 - 99 mg/dL   BUN 45 (H) 6 - 23 mg/dL   Creatinine, Ser 1.95 (H) 0.40 - 1.50 mg/dL   Total Bilirubin 1.0 0.2 - 1.2 mg/dL   Alkaline Phosphatase 80 39 - 117 U/L   AST 15 0 - 37 U/L   ALT 19 0 - 53 U/L   Total Protein 6.5 6.0 - 8.3 g/dL   Albumin 4.3 3.5 - 5.2 g/dL   GFR 34.05 (L) >60.00 mL/min   Calcium 9.6  8.4 - 10.5 mg/dL  Lipid panel  Result Value Ref Range   Cholesterol 113 0 - 200 mg/dL   Triglycerides 112.0 0.0 - 149.0 mg/dL   HDL 32.00 (L) >39.00 mg/dL   VLDL 22.4 0.0 - 40.0 mg/dL   LDL Cholesterol 59 0 - 99 mg/dL   Total CHOL/HDL Ratio 4    NonHDL 80.90    Assessment & Plan:  This visit occurred during the SARS-CoV-2 public health emergency.  Safety protocols were in place, including screening questions prior to the visit, additional usage of staff PPE, and extensive cleaning of exam room while observing appropriate contact time as indicated for disinfecting solutions.   Problem List Items Addressed This Visit    Vitamin B12 deficiency    See above. Add oral to IM replacement.       Uncontrolled type 2 diabetes mellitus with peripheral neuropathy (HCC)    Followed by endo on insulin (Humulin NPH and novolin R).       Systolic murmur    Persists.       Recurrent nephrolithiasis    Sees urology, recently potassium citrate  18mEq increased to 5 a day - with hyperkalemia. I suggested he back down to 4 a day and recheck potassium levels in 4 wks.       Relevant Medications   Potassium Citrate 15 MEQ (1620 MG) TBCR   Other Relevant Orders   Renal function panel   Pernicious anemia    B12 levels remain low normal despite monthly b12 shots (last done 03/30/2019) - I advised he add 1084mcg B12 oral daily.       Relevant Medications   Cyanocobalamin (B-12) 1000 MCG SUBL   PAD (peripheral artery disease) (HCC)   OSA (obstructive sleep apnea)    Continues CPAP, has seen pulm Elsworth Soho) for this.       Obesity, morbid, BMI 40.0-49.9 ([place])    Encouraged healthy diet and regular activity for goal sustainable weight loss.       Iron deficiency anemia    Continues iron daily. Anemia has resolved. Will send cologuard.       Relevant Medications   Cyanocobalamin (B-12) 1000 MCG SUBL   History of deep vein thrombosis (DVT) of lower extremity    Continue baby aspirin.       Heart failure with preserved ejection fraction (HCC)   Essential hypertension    Chronic, stable on current regimen.       Dyslipidemia    Chronic, stable on lovastatin 80mg  daily.  The ASCVD Risk score Mikey Bussing DC Jr., et al., 2013) failed to calculate for the following reasons:   The valid total cholesterol range is 130 to 320 mg/dL       CKD stage 3 due to type 2 diabetes mellitus [facility])    Now seeing nephrologist regularly (Kolluru). Appreciate their care.       Chronic gout    Stable period on daily allopurinol       Advanced care planning/counseling discussion - Primary    Advanced directive discussion - has at home. HCPOA would be wife. Asked to bring [name] a copy.        Other Visit Diagnoses    Hyperkalemia       Relevant Orders   Renal function panel       Meds ordered this encounter  Medications  . Cyanocobalamin (B-12) 1000 MCG SUBL    Sig: Place 1 tablet under the tongue daily.   Orders Placed This Encounter  Procedures  . Renal function panel    Standing Status:   Future    Standing Expiration Date:   04/26/2020    Patient instructions: We will sign you up again for Cologuard.  If interested, check with pharmacy about new 2 shot shingles series (shingrix).  Bring [name] copy of your living will.  Continue monthly B12 shots. Add oral b12 1065mcg daily. Recheck levels in 6 months.  Drop potassium to 4 a day, recheck potassium levels in 1 month (lab visit only).  Continue current medicines otherwise.   Follow up plan: No follow-ups on file.  Ria Bush, MD

## 2019-04-23 NOTE — Patient Instructions (Addendum)
We will sign you up again for Cologuard.  If interested, check with pharmacy about new 2 shot shingles series (shingrix).  Bring [name] copy of your living will.  Continue monthly B12 shots. Add oral b12 1047mcg daily. Recheck levels in 6 months.  Drop potassium to 4 a day, recheck potassium levels in 1 month (lab visit only).  Continue current medicines otherwise.   Health Maintenance After Age 72 After age 54, you are at a higher risk for certain long-term diseases and infections as well as injuries from falls. Falls are a major cause of broken bones and head injuries in people who are older than age 75. Getting regular preventive care can help to keep you healthy and well. Preventive care includes getting regular testing and making lifestyle changes as recommended by your health care provider. Talk with your health care provider about:  Which screenings and tests you should have. A screening is a test that checks for a disease when you have no symptoms.  A diet and exercise plan that is right for you. What should I know about screenings and tests to prevent falls? Screening and testing are the best ways to find a health problem early. Early diagnosis and treatment give you the best chance of managing medical conditions that are common after age 49. Certain conditions and lifestyle choices may make you more likely to have a fall. Your health care provider may recommend:  Regular vision checks. Poor vision and conditions such as cataracts can make you more likely to have a fall. If you wear glasses, make sure to get your prescription updated if your vision changes.  Medicine review. Work with your health care provider to regularly review all of the medicines you are taking, including over-the-counter medicines. Ask your health care provider about any side effects that may make you more likely to have a fall. Tell your health care provider if any medicines that you take make you feel dizzy or  sleepy.  Osteoporosis screening. Osteoporosis is a condition that causes the bones to get weaker. This can make the bones weak and cause them to break more easily.  Blood pressure screening. Blood pressure changes and medicines to control blood pressure can make you feel dizzy.  Strength and balance checks. Your health care provider may recommend certain tests to check your strength and balance while standing, walking, or changing positions.  Foot health exam. Foot pain and numbness, as well as not wearing proper footwear, can make you more likely to have a fall.  Depression screening. You may be more likely to have a fall if you have a fear of falling, feel emotionally low, or feel unable to do activities that you used to do.  Alcohol use screening. Using too much alcohol can affect your balance and may make you more likely to have a fall. What actions can I take to lower my risk of falls? General instructions  Talk with your health care provider about your risks for falling. Tell your health care provider if: ? You fall. Be sure to tell your health care provider about all falls, even ones that seem minor. ? You feel dizzy, sleepy, or off-balance.  Take over-the-counter and prescription medicines only as told by your health care provider. These include any supplements.  Eat a healthy diet and maintain a healthy weight. A healthy diet includes low-fat dairy products, low-fat (lean) meats, and fiber from whole grains, beans, and lots of fruits and vegetables. Home safety  Remove any  tripping hazards, such as rugs, cords, and clutter.  Install safety equipment such as grab bars in bathrooms and safety rails on stairs.  Keep rooms and walkways well-lit. Activity   Follow a regular exercise program to stay fit. This will help you maintain your balance. Ask your health care provider what types of exercise are appropriate for you.  If you need a cane or walker, use it as recommended by  your health care provider.  Wear supportive shoes that have nonskid soles. Lifestyle  Do not drink alcohol if your health care provider tells you not to drink.  If you drink alcohol, limit how much you have: ? 0-1 drink a day for women. ? 0-2 drinks a day for men.  Be aware of how much alcohol is in your drink. In the [place] one drink equals one typical bottle of beer (12 oz), one-half glass of wine (5 oz), or one shot of hard liquor (1 oz).  Do not use any products that contain nicotine or tobacco, such as cigarettes and e-cigarettes. If you need help quitting, ask your health care provider. Summary  Having a healthy lifestyle and getting preventive care can help to protect your health and wellness after age 12.  Screening and testing are the best way to find a health problem early and help you avoid having a fall. Early diagnosis and treatment give you the best chance for managing medical conditions that are more common for people who are older than age 31.  Falls are a major cause of broken bones and head injuries in people who are older than age 74. Take precautions to prevent a fall at home.  Work with your health care provider to learn what changes you can make to improve your health and wellness and to prevent falls. This information is not intended to replace advice given to you by your health care provider. Make sure you discuss any questions you have with your health care provider. Document Revised: 04/23/2018 Document Reviewed: 11/13/2016 Elsevier Patient Education  2020 [employer].

## 2019-04-27 MED ORDER — B-12 1000 MCG SL SUBL: 1.0000 | SUBLINGUAL_TABLET | Freq: Every day | SUBLINGUAL | Status: DC

## 2019-04-27 NOTE — Assessment & Plan Note (Signed)
Continue baby aspirin

## 2019-04-27 NOTE — Assessment & Plan Note (Signed)
See above. Add oral to IM replacement.

## 2019-04-27 NOTE — Assessment & Plan Note (Signed)
Now seeing nephrologist regularly (Kolluru). Appreciate their care.

## 2019-04-27 NOTE — Assessment & Plan Note (Signed)
Chronic, stable on lovastatin 80mg  daily.  The ASCVD Risk score Donald Bussing DC Jr., et al., 2013) failed to calculate for the following reasons:   The valid total cholesterol range is 130 to 320 mg/dL

## 2019-04-27 NOTE — Assessment & Plan Note (Addendum)
B12 levels remain low normal despite monthly b12 shots (last done 03/30/2019) - I advised he add 1029mcg B12 oral daily.

## 2019-04-27 NOTE — Assessment & Plan Note (Addendum)
Sees urology, recently potassium citrate 16mEq increased to 5 a day - with hyperkalemia. I suggested he back down to 4 a day and recheck potassium levels in 4 wks.

## 2019-04-27 NOTE — Assessment & Plan Note (Signed)
Chronic, stable on current regimen.  

## 2019-04-27 NOTE — Assessment & Plan Note (Signed)
Stable period on daily allopurinol

## 2019-04-27 NOTE — Assessment & Plan Note (Addendum)
Continues CPAP, has seen pulm Elsworth Soho) for this.

## 2019-04-27 NOTE — Assessment & Plan Note (Signed)
Followed by endo on insulin (Humulin NPH and novolin R).

## 2019-04-27 NOTE — Assessment & Plan Note (Signed)
Persists.  

## 2019-04-27 NOTE — Assessment & Plan Note (Addendum)
Continues iron daily. Anemia has resolved. Will send cologuard.

## 2019-04-27 NOTE — Assessment & Plan Note (Signed)
Encouraged healthy diet and regular activity for goal sustainable weight loss.

## 2019-05-05 ENCOUNTER — Ambulatory Visit (INDEPENDENT_AMBULATORY_CARE_PROVIDER_SITE_OTHER): Payer: Medicare Other

## 2019-05-05 DIAGNOSIS — E538 Deficiency of other specified B group vitamins: Secondary | ICD-10-CM | POA: Diagnosis not present

## 2019-05-05 MED ORDER — CYANOCOBALAMIN 1000 MCG/ML IJ SOLN
1000.0000 ug | Freq: Once | INTRAMUSCULAR | Status: AC
  Administered 2019-05-05: 1000 ug via INTRAMUSCULAR

## 2019-05-05 NOTE — Progress Notes (Signed)
Per orders of Dr. Gutierrez, injection of B12 given by Eda Magnussen. Patient tolerated injection well.  

## 2019-05-10 ENCOUNTER — Ambulatory Visit: Payer: Medicare Other | Admitting: Endocrinology

## 2019-05-11 DIAGNOSIS — Z5181 Encounter for therapeutic drug level monitoring: Secondary | ICD-10-CM | POA: Diagnosis not present

## 2019-05-11 DIAGNOSIS — M5030 Other cervical disc degeneration, unspecified cervical region: Secondary | ICD-10-CM | POA: Diagnosis not present

## 2019-05-11 DIAGNOSIS — Z79891 Long term (current) use of opiate analgesic: Secondary | ICD-10-CM | POA: Diagnosis not present

## 2019-05-11 DIAGNOSIS — Z79899 Other long term (current) drug therapy: Secondary | ICD-10-CM | POA: Diagnosis not present

## 2019-05-11 DIAGNOSIS — M5136 Other intervertebral disc degeneration, lumbar region: Secondary | ICD-10-CM | POA: Diagnosis not present

## 2019-05-19 DIAGNOSIS — M5412 Radiculopathy, cervical region: Secondary | ICD-10-CM | POA: Diagnosis not present

## 2019-05-25 ENCOUNTER — Other Ambulatory Visit: Payer: Self-pay | Admitting: Family Medicine

## 2019-05-25 ENCOUNTER — Other Ambulatory Visit: Payer: Medicare Other

## 2019-05-25 NOTE — Telephone Encounter (Signed)
Refill request Alprazolam Last refill 04/23/19 #30 Last office visit 04/23/19

## 2019-05-28 ENCOUNTER — Ambulatory Visit: Payer: Medicare Other | Admitting: Endocrinology

## 2019-05-28 DIAGNOSIS — M5412 Radiculopathy, cervical region: Secondary | ICD-10-CM | POA: Diagnosis not present

## 2019-05-28 NOTE — Telephone Encounter (Signed)
ERx 

## 2019-05-31 DIAGNOSIS — M5412 Radiculopathy, cervical region: Secondary | ICD-10-CM | POA: Diagnosis not present

## 2019-06-01 ENCOUNTER — Other Ambulatory Visit: Payer: Self-pay

## 2019-06-02 ENCOUNTER — Encounter: Payer: Self-pay | Admitting: Endocrinology

## 2019-06-02 ENCOUNTER — Ambulatory Visit (INDEPENDENT_AMBULATORY_CARE_PROVIDER_SITE_OTHER): Payer: Medicare Other | Admitting: Endocrinology

## 2019-06-02 VITALS — BP 144/60 | HR 73 | Ht 64.0 in | Wt 280.0 lb

## 2019-06-02 DIAGNOSIS — E1121 Type 2 diabetes mellitus with diabetic nephropathy: Secondary | ICD-10-CM | POA: Diagnosis not present

## 2019-06-02 DIAGNOSIS — N1832 Chronic kidney disease, stage 3b: Secondary | ICD-10-CM

## 2019-06-02 DIAGNOSIS — Z794 Long term (current) use of insulin: Secondary | ICD-10-CM

## 2019-06-02 DIAGNOSIS — M5412 Radiculopathy, cervical region: Secondary | ICD-10-CM | POA: Diagnosis not present

## 2019-06-02 NOTE — Progress Notes (Signed)
Subjective:    Patient ID: Donald [name], male    DOB: 1945-09-25, 74 y.o.   MRN: [id number]  HPI Pt returns for f/u of diabetes mellitus: DM type: Insulin-requiring type 2 Dx'ed: 0623 Complications: polyneuropathy, CAD and stage 4 CRI.   Therapy: insulin since 1995 DKA: never.  Severe hypoglycemia: never.  Pancreatitis: never.  SDOH: he declines name brand meds.  Other: he takes multiple daily injections; he declines weight-loss surgery; he has severe insulin resistance; he declines pump; He often eats 2 meals per day, so he takes only 2 injections of novolog.  Interval history: He brings a record of his cbg's which I have reviewed today, checked BID.  cbg varies from 71-190.  There is no trend throughout the day.  It was highest after steroids, but he has 1 scheduled for tomorrow.  He has mild hypoglycemia approx 3-4 times per month.  This usually happens in the afternoon.   Past Medical History:  Diagnosis Date  . (HFpEF) heart failure with preserved ejection fraction ([place])    a. 09/2018 Echo: EF 60-65%. PASP 55mmHg. Mild-mod LAE. Mild MR/TR.  Marland Kitchen ANEMIA-IRON DEFICIENCY 07/26/2006  . Anxiety   . ASTHMA 07/26/2006  . Asthma   . Back pain, chronic   . Bell's palsy   . CKD (chronic kidney disease), stage III   . COLONIC POLYPS, HX OF 07/26/2006  . DEPRESSION 03/14/2009  . DIABETES MELLITUS, TYPE II 07/26/2006  . [place] DISEASE, LUMBAR 10/05/2007  . DVT 12/03/2007  . DYSLIPIDEMIA 04/13/2009  . Dyspnea    when gets up and walks around and back is hurting really bad -only Shortness of breath  then  . GERD 07/26/2006  . Gout   . Heart murmur   . History of kidney stones   . HYPERTENSION 07/26/2006  . INSOMNIA 08/21/2007  . Neuropathy   . Nonobstructive CAD (coronary artery disease)    a. 2012 Cath: no high grade stenosis; b. 2018 MV: No ischemia. Attenuation artifact.   . OBSTRUCTIVE SLEEP APNEA 12/03/2007   Use C-PAP  . PERIPHERAL NEUROPATHY 07/26/2006  . Pernicious anemia 11/20/2006    . PULMONARY EMBOLISM 10/05/2007    Past Surgical History:  Procedure Laterality Date  . APPENDECTOMY  1968  . [place], 06/14/2008   Dr[facility] Trenton Gammon [facility] (06/10)  . CARDIAC CATHETERIZATION    . CARPAL TUNNEL RELEASE Right 10/2017  . CATARACT EXTRACTION W/PHACO Left 05/30/2014   Procedure: CATARACT EXTRACTION PHACO AND INTRAOCULAR LENS PLACEMENT (IOC);  Surgeon: Estill Cotta, MD;  Location: [facility] ORS;  Service: Ophthalmology;  Laterality: Left;  [name] 01:20 AP% 23.7 CDE 31.86  . COLONOSCOPY  10/2002   HP, SSA, TA, rpt 3 yrs ([place])  . COLONOSCOPY  01/2005   diverticulosis rpt 5 yrs ([place])   . IR NEPHROSTOMY PLACEMENT LEFT  10/19/2018  . IR URETERAL STENT LEFT NEW ACCESS W/O SEP NEPHROSTOMY CATH  10/19/2018  . LITHOTRIPSY     X 2  . NEPHROLITHOTOMY Left 10/19/2018   Procedure: NEPHROLITHOTOMY PERCUTANEOUS;  Surgeon: Franchot Gallo, MD;  Location: [facility] ORS;  Service: Urology;  Laterality: Left;  3 HRS  . PARATHYROIDECTOMY Right 04/18/2016   PARATHYROIDECTOMY for cyst [name], MD)  . SHOULDER ARTHROSCOPY W/ ROTATOR CUFF REPAIR Right   . TONSILLECTOMY      Social History   Socioeconomic History  . Marital status: Married    Spouse name: Not on file  . Number of children: Not on file  . Years  of education: Not on file  . Highest education level: Not on file  Occupational History  . Occupation: Disabled    Employer: [employer]  Tobacco Use  . Smoking status: Never Smoker  . Smokeless tobacco: Never Used  Substance and Sexual Activity  . Alcohol use: No  . Drug use: No  . Sexual activity: Not on file  Other Topics Concern  . 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   Activity is severely limited by medical problems   Diet is "good".   Never a smoker   Alcohol: none         Social Determinants of Health   Financial Resource Strain: Low Risk   . Difficulty of  Paying Living Expenses: Not hard at all  Food Insecurity: No Food Insecurity  . Worried About Charity fundraiser in the Last Year: Never true  . Ran Out of Food in the Last Year: Never true  Transportation Needs: No Transportation Needs  . Lack of Transportation (Medical): No  . Lack of Transportation (Non-Medical): No  Physical Activity: Inactive  . Days of Exercise per Week: 0 days  . Minutes of Exercise per Session: 0 min  Stress: No Stress Concern Present  . Feeling of Stress : Not at all  Social Connections:   . Frequency of Communication with Friends and Family:   . Frequency of Social Gatherings with Friends and Family:   . Attends Religious Services:   . Active Member of Clubs or Organizations:   . Attends Archivist Meetings:   Marland Kitchen Marital Status:   Intimate Partner Violence: Not At Risk  . Fear of Current or Ex-Partner: No  . Emotionally Abused: No  . Physically Abused: No  . Sexually Abused: No    Current Outpatient Medications on File Prior to Visit  Medication Sig Dispense Refill  . allopurinol (ZYLOPRIM) 300 MG tablet TAKE ONE TABLET EVERY DAY 30 tablet 2  . ALPRAZolam (XANAX) 1 MG tablet TAKE ONE TABLET AT BEDTIME IF NEEDED FORSLEEP 30 tablet 0  . aspirin EC 81 MG tablet Take 1 tablet (81 mg total) by mouth daily.    . colchicine (COLCRYS) 0.6 MG tablet TAKE 1 TABLET BY MOUTH EVERY HOUR UNTIL SYMPTOMS ARE BETTER, MAX OF 6TABS PER DAY. STOP IF DIARRHEA 30 tablet 4  . cyanocobalamin (,VITAMIN B-12,) 1000 MCG/ML injection Inject 1,000 mcg into the muscle every 30 (thirty) days.    . Cyanocobalamin (B-12) 1000 MCG SUBL Place 1 tablet under the tongue daily.    . ferrous sulfate 324 (65 Fe) MG TBEC Take 1 tablet (325 mg total) by mouth daily. 30 tablet   . fluticasone (FLONASE) 50 MCG/ACT nasal spray Place 2 sprays into both nostrils daily. 16 g 1  . furosemide (LASIX) 80 MG tablet TAKE ONE TABLET (80 MG) BY MOUTH EVERY DAY 90 tablet 3  . gabapentin (NEURONTIN)  600 MG tablet TAKE TWO TABLETS (1200MG ) BY MOUTH AT BEDTIME 180 tablet 3  . HYDROcodone-acetaminophen (NORCO) 10-325 MG tablet Take 1 tablet by mouth every 6 (six) hours as needed for moderate pain ((typically once to twice daily if needed)).  30 tablet 0  . insulin NPH Human (HUMULIN N) 100 UNIT/ML injection Inject 2.9 mLs (290 Units total) into the skin at bedtime. 90 mL 5  . insulin regular (NOVOLIN R RELION) 100 units/mL injection 3 times a day (just before each meal),  210-150-290 units, and syringes 4/day. 210 mL 0  . losartan (COZAAR) 25 MG tablet TAKE ONE TABLET AT BEDTIME 90 tablet 0  . lovastatin (MEVACOR) 40 MG tablet TAKE 2 TABLETS BY MOUTH (80MG  TOTAL) AT BEDTIME 180 tablet 3  . Naphazoline-Pheniramine (OPCON-A OP) Place 1 drop into both eyes 3 (three) times daily as needed (itchy eyes.).     Marland Kitchen ondansetron (ZOFRAN) 4 MG tablet Take 1 tablet (4 mg total) by mouth every 4 (four) hours as needed for nausea. 10 tablet 0  . Potassium Citrate 15 MEQ (1620 MG) TBCR Take 1 tablet by mouth in the morning, at noon, in the evening, and at bedtime.    [name] Sodium (STOOL SOFTENER & LAXATIVE PO) Take 2 tablets by mouth at bedtime.    Marland Kitchen amLODipine (NORVASC) 5 MG tablet Take 1 tablet (5 mg total) by mouth daily. 90 tablet 3   No current facility-administered medications on file prior to visit.    Allergies  Allergen Reactions  . Codeine Other (See Comments)    REACTION: chest pain  . Pioglitazone Other (See Comments)    REACTION to Actos: swelling in ankles    Family History  Problem Relation Age of Onset  . Cancer Mother        Breast Cancer  . Cancer Sister        Breast Cancer  . Diabetes Father   . Heart disease Father   . Diabetes Paternal Grandfather     BP (!) 144/60   Pulse 73   Ht 5\' 4"  (1.626 m)   Wt 280 lb (127 kg)   SpO2 94%   BMI 48.06 kg/m    Review of Systems Denies LOC    Objective:   Physical Exam VITAL SIGNS:  See vs page GENERAL: no  distress Pulses: dorsalis pedis intact bilat.   MSK: no deformity of the feet CV: 2+ bilat leg edema Skin:  no ulcer on the feet.  normal color and temp on the feet. Neuro: sensation is intact to touch on the feet  Lab Results  Component Value Date   CREATININE 1.95 (H) 04/20/2019   BUN 45 (H) 04/20/2019   NA 144 04/20/2019   K 5.2 (H) 04/20/2019   CL 99 04/20/2019   CO2 36 (H) 04/20/2019    Lab Results  Component Value Date   HGBA1C 7.7 (H) 04/20/2019       Assessment & Plan:  Insulin-requiring type 2 DM, with stage 4 CRI: worse.  HTN: is noted today.   Patient Instructions  Your blood pressure is high today.  Please see your primary care provider soon, to have it rechecked.   Please continue the same insulins.  After the steroid injection, take 10 extra units for any blood sugar over 200. check your blood sugar twice a day.  vary the time of day when you check, between before the 3 meals, and at bedtime.  also check if you have symptoms of your blood sugar being too high or too low.  please keep a record of the readings and bring it to your next appointment here (or you can bring the meter itself).  You can write it on any piece of paper.  please call us sooner if your blood sugar goes below 70, or if you have a lot of readings over 200.  Please come back for a follow-up appointment in 3 months.

## 2019-06-02 NOTE — Patient Instructions (Addendum)
Your blood pressure is high today.  Please see your primary care provider soon, to have it rechecked.   Please continue the same insulins.  After the steroid injection, take 10 extra units for any blood sugar over 200. check your blood sugar twice a day.  vary the time of day when you check, between before the 3 meals, and at bedtime.  also check if you have symptoms of your blood sugar being too high or too low.  please keep a record of the readings and bring it to your next appointment here (or you can bring the meter itself).  You can write it on any piece of paper.  please call us sooner if your blood sugar goes below 70, or if you have a lot of readings over 200.  Please come back for a follow-up appointment in 3 months.

## 2019-06-03 DIAGNOSIS — M5136 Other intervertebral disc degeneration, lumbar region: Secondary | ICD-10-CM | POA: Diagnosis not present

## 2019-06-04 ENCOUNTER — Other Ambulatory Visit: Payer: Self-pay | Admitting: Endocrinology

## 2019-06-04 DIAGNOSIS — E119 Type 2 diabetes mellitus without complications: Secondary | ICD-10-CM

## 2019-06-05 ENCOUNTER — Other Ambulatory Visit: Payer: Self-pay | Admitting: Family Medicine

## 2019-06-08 ENCOUNTER — Ambulatory Visit: Payer: Medicare Other

## 2019-06-08 ENCOUNTER — Other Ambulatory Visit: Payer: Medicare Other

## 2019-06-16 DIAGNOSIS — M5412 Radiculopathy, cervical region: Secondary | ICD-10-CM | POA: Diagnosis not present

## 2019-06-18 ENCOUNTER — Telehealth (INDEPENDENT_AMBULATORY_CARE_PROVIDER_SITE_OTHER): Payer: Medicare Other | Admitting: Family Medicine

## 2019-06-18 ENCOUNTER — Encounter: Payer: Self-pay | Admitting: Family Medicine

## 2019-06-18 VITALS — BP 166/59 | HR 62 | Temp 98.1°F | Ht 64.0 in | Wt 278.0 lb

## 2019-06-18 DIAGNOSIS — R059 Cough, unspecified: Secondary | ICD-10-CM

## 2019-06-18 DIAGNOSIS — E1142 Type 2 diabetes mellitus with diabetic polyneuropathy: Secondary | ICD-10-CM

## 2019-06-18 DIAGNOSIS — R0981 Nasal congestion: Secondary | ICD-10-CM

## 2019-06-18 DIAGNOSIS — E1165 Type 2 diabetes mellitus with hyperglycemia: Secondary | ICD-10-CM | POA: Diagnosis not present

## 2019-06-18 DIAGNOSIS — R05 Cough: Secondary | ICD-10-CM

## 2019-06-18 DIAGNOSIS — IMO0002 Reserved for concepts with insufficient information to code with codable children: Secondary | ICD-10-CM

## 2019-06-18 MED ORDER — FLUTICASONE PROPIONATE 50 MCG/ACT NA SUSP: 2.0000 | Freq: Every day | NASAL | 1 refills | Status: DC

## 2019-06-18 NOTE — Progress Notes (Signed)
Virtual Visit via Video Note  I connected with Donald [name] on 06/18/19 at  4:00 PM EDT by a video enabled telemedicine application and verified that I am speaking with the correct person using two identifiers.  Patient is unable to connect for video visit so call was completed with audio only.  Location: Patient: in his home Provider: Painter Persons participating in virtual visit: patient, provider, Jiles Garter DNP student   I discussed the limitations of evaluation and management by telemedicine and the availability of in person appointments. The patient expressed understanding and agreed to proceed.  History of Present Illness: Chief Complaint  Patient presents with  . Cough    cough, congestion and headache x 2 days - last night worsened. Pt states that this morning headache was more severe, nasal congestion and cough is productive - brown/yellow mucous. Pt took Tylenol today for pain and HA will not go away. Not taking any OTC medication or allergy meds. Pt denies chest tightness, pain and SOB. No fever. Pt has been in contact with anyone with Covid or sick Symptoms. Pt wife was sick with a sinus infection x 1 week ago which required abx.    This is a 74 year old male who presents today for virtual visit for above chief complaint.  He woke yesterday with nasal congestion, cough, postnasal drainage.  He has a headache over his eyes and nose.  He has not had a fever, myalgias.  He has been fatigued.  He has some yellow-brown nasal drainage and thick sputum produced with cough.  He denies shortness of breath or wheeze.  He has a history of allergic rhinitis but is not currently taking any medication.  Was previously on nasal sprays but has stopped those.  He drinks very little water.  He has a history of diabetes and reports that his blood sugars have been averaging 130s.  He had an epidural shot a couple of weeks ago that raised his blood sugars but they are back to his baseline now.   He is not a smoker.  His wife has had some similar symptoms and has been on antibiotic for pharyngitis.  He took 2 extra strength Tylenol this morning and again this afternoon with a little improvement.  He has been vaccinated for COVID-19.  Observations/Objective: Patient is normally conversive.  There is no increased work of breathing with talking. No witnessed cough.  Sounds mildly congested in his nose.  Answers questions appropriately.  BP (!) 166/59 (BP Location: Left Arm, Patient Position: Sitting, Cuff Size: Normal)   Pulse 62   Temp 98.1 F (36.7 C) (Temporal)   Ht 5\' 4"  (1.626 m)   Wt 278 lb (126.1 kg)   SpO2 96%   BMI 47.72 kg/m  Wt Readings from Last 3 Encounters:  06/18/19 278 lb (126.1 kg)  06/02/19 280 lb (127 kg)  04/23/19 281 lb 4 oz (127.6 kg)     Assessment and Plan: 1. Nasal congestion -Suspect symptoms are primarily related to allergic rhinitis as he has stopped all of his seasonal allergy medications. -We will have him restart his fluticasone nasal spray, add Mucinex DM to help thin secretions and suppress cough.  Encouraged him to significantly increase his fluid intake. - fluticasone (FLONASE) 50 MCG/ACT nasal spray; Place 2 sprays into both nostrils daily.  Dispense: 16 g; Refill: 1  2. Cough -Per #1 -Follow-up precautions reviewed, patient with my the office if he develops worsening symptoms or has no improvement in 3 to  5 days  3. Uncontrolled type 2 diabetes mellitus with peripheral neuropathy ([place]) -Currently home glucose readings in acceptable range, advised him to monitor carefully during illness and report consistent elevations over [place], FNP-BC  [facility], [place]  06/18/2019 5:09 PM   Follow Up Instructions:    I discussed the assessment and treatment plan with the patient. The patient was provided an opportunity to ask questions and all were answered. The patient agreed  with the plan and demonstrated an understanding of the instructions.   The patient was advised to call back or seek an in-person evaluation if the symptoms worsen or if the condition fails to improve as anticipated.  I provided 12 minutes of non-face-to-face time during this encounter.   Elby Beck, FNP

## 2019-06-22 ENCOUNTER — Telehealth: Payer: Self-pay

## 2019-06-22 NOTE — Telephone Encounter (Signed)
Patient contacted the office and states that he spoke with Debbie on Friday, 6/4. He states he was complaining of severe productive cough, & nasal congestion. He states Debbie advised some OTC meds and she sent in a nasal spray, and he states he is not feeling any better at all. He states the cough is keeping him up at night, and he is unable to rest. He is wondering if Jackelyn Poling has nay other recommendations?

## 2019-06-22 NOTE — Telephone Encounter (Signed)
Please call patient and ask him what he is taking for cough? Is he running a fever? Having SOB or wheeze? I am happy to send in prescription for cough medication to take at bedtime.

## 2019-06-23 ENCOUNTER — Telehealth (INDEPENDENT_AMBULATORY_CARE_PROVIDER_SITE_OTHER): Payer: Medicare Other | Admitting: Family Medicine

## 2019-06-23 ENCOUNTER — Encounter: Payer: Self-pay | Admitting: Family Medicine

## 2019-06-23 DIAGNOSIS — J019 Acute sinusitis, unspecified: Secondary | ICD-10-CM | POA: Diagnosis not present

## 2019-06-23 MED ORDER — HYDROCODONE-HOMATROPINE 5-1.5 MG/5ML PO SYRP: 5.0000 mL | ORAL_SOLUTION | Freq: Two times a day (BID) | ORAL | 0 refills | Status: DC | PRN

## 2019-06-23 MED ORDER — AMOXICILLIN-POT CLAVULANATE 875-125 MG PO TABS
1.0000 | ORAL_TABLET | Freq: Two times a day (BID) | ORAL | 0 refills | Status: AC
Start: 2019-06-23 — End: 2019-07-03

## 2019-06-23 NOTE — Assessment & Plan Note (Signed)
Anticipate bacterial given duration and progression of symptoms. Will cover with augmentin 10d course. Codeine allergy - will Rx hycodan cough syrup for night time - with hyperglycemia and sedation precautions. He is aware not to mix cough syrup with his vicodin (sparingly uses). Update if not improving with treatment, discussed possible COVID swab if worsening despite meds. Pt agrees with plan.

## 2019-06-23 NOTE — Telephone Encounter (Signed)
Pt being seen by Dr Darnell Level this morning for a virtual visit 06/23/2019 at 9:30 AM  Pt states that he has been taking Delsym, Tessalon Perles, Mucinex DM and Nasal spray  No fever  Some wheezing over the weekend which stopped on Sunday  Will send to [place] to make her aware for the virtual visit this morning.

## 2019-06-23 NOTE — Telephone Encounter (Signed)
Noted. Thanks.

## 2019-06-23 NOTE — Progress Notes (Addendum)
Virtual visit attempted through MyChart , a video enabled telemedicine application. Due to national recommendations of social distancing due to COVID-19, a virtual visit is felt to be most appropriate for this patient at this time. Reviewed limitations, risks, security and privacy concerns of performing a virtual visit and the availability of in person appointments. I also reviewed that there may be a patient responsible charge related to this service. The patient agreed to proceed.  Interactive audio and video telecommunications were attempted between myself and [employer], however failed due to patient having technical difficulties OR patient not having access to video capability.  We continued and completed visit with audio only.  Time spent ~20 min, >50% of visit spent on phone.   Patient location: home Provider location: [place] at [facility], office Persons participating in this virtual visit: patient, provider   If any vitals were documented, they were collected by patient at home unless specified below.    BP (!) 151/48   Pulse (!) 50   Temp 98.1 F (36.7 C)   SpO2 96%    CC: cough, congestion Subjective:    Patient ID: Donald [name], male    DOB: 11-18-1945, 74 y.o.   MRN: [id number]  HPI: JARRY MANON is a 74 y.o. male presenting on 06/23/2019 for Cough (Pt states that he has been taking Delsym, Tessalon Perles, Mucinex DM and Nasal spray - no relief - mild wheezing, productive cough / Mucus is green/brown & blood tinged - no fever )   1 wk h/o ST with PNdrainage, hoarseness, productive cough of green/grey mucous, blowing nose, now with streaks of blood when blowing nose. Feeling lousy, fatigued, no appetite. Head > chest congestion. Had L earache last week but that has since resolved. Some wheezing over the weekend, now improved. Facial pressure pain to eyes/nose.   No fever. No dyspnea, tooth pain. No abd pain, nausea, diarrhea. No loss of taste or smell.    Treating with tessalon perls, mucinex DM, delsym and flonase without any improvement.  Wife with pharyngitis recently on antibiotic.  No known COVID exposure.   Did receive [place] 02/2019.  Lab Results  Component Value Date   HGBA1C 7.7 (H) 04/20/2019       Relevant past medical, surgical, family and social history reviewed and updated as indicated. Interim medical history since our last visit reviewed. Allergies and medications reviewed and updated. Outpatient Medications Prior to Visit  Medication Sig Dispense Refill  . allopurinol (ZYLOPRIM) 300 MG tablet TAKE ONE TABLET EVERY DAY 30 tablet 2  . ALPRAZolam (XANAX) 1 MG tablet TAKE ONE TABLET AT BEDTIME IF NEEDED FORSLEEP 30 tablet 0  . amLODipine (NORVASC) 5 MG tablet Take 1 tablet (5 mg total) by mouth daily. 90 tablet 3  . aspirin EC 81 MG tablet Take 1 tablet (81 mg total) by mouth daily.    . colchicine (COLCRYS) 0.6 MG tablet TAKE 1 TABLET BY MOUTH EVERY HOUR UNTIL SYMPTOMS ARE BETTER, MAX OF 6TABS PER DAY. STOP IF DIARRHEA 30 tablet 4  . cyanocobalamin (,VITAMIN B-12,) 1000 MCG/ML injection Inject 1,000 mcg into the muscle every 30 (thirty) days.    . Cyanocobalamin (B-12) 1000 MCG SUBL Place 1 tablet under the tongue daily.    . ferrous sulfate 324 (65 Fe) MG TBEC Take 1 tablet (325 mg total) by mouth daily. 30 tablet   . fluticasone (FLONASE) 50 MCG/ACT nasal spray Place 2 sprays into both nostrils daily. 16 g  1  . furosemide (LASIX) 80 MG tablet TAKE ONE TABLET (80 MG) BY MOUTH EVERY DAY 90 tablet 3  . gabapentin (NEURONTIN) 600 MG tablet TAKE TWO TABLETS (1200MG ) BY MOUTH AT BEDTIME 180 tablet 3  . GE100 BLOOD GLUCOSE TEST test strip TEST FOUR TIMES DAILY 200 each 1  . HYDROcodone-acetaminophen (NORCO) 10-325 MG tablet Take 1 tablet by mouth every 6 (six) hours as needed for moderate pain ((typically once to twice daily if needed)).  30 tablet 0  . insulin NPH Human (HUMULIN N) 100 UNIT/ML injection Inject  2.9 mLs (290 Units total) into the skin at bedtime. 90 mL 5  . insulin regular (NOVOLIN R RELION) 100 units/mL injection 3 times a day (just before each meal), 210-150-290 units, and syringes 4/day. 210 mL 0  . losartan (COZAAR) 25 MG tablet TAKE ONE TABLET AT BEDTIME 90 tablet 3  . lovastatin (MEVACOR) 40 MG tablet TAKE TWO TABLETS BY MOUTH AT BEDTIME 180 tablet 3  . Naphazoline-Pheniramine (OPCON-A OP) Place 1 drop into both eyes 3 (three) times daily as needed (itchy eyes.).     Marland Kitchen ondansetron (ZOFRAN) 4 MG tablet Take 1 tablet (4 mg total) by mouth every 4 (four) hours as needed for nausea. 10 tablet 0  . Potassium Citrate 15 MEQ (1620 MG) TBCR Take 1 tablet by mouth in the morning, at noon, in the evening, and at bedtime.    [name] Sodium (STOOL SOFTENER & LAXATIVE PO) Take 2 tablets by mouth at bedtime.     No facility-administered medications prior to visit.     Per HPI unless specifically indicated in ROS section below Review of Systems Objective:  BP (!) 151/48   Pulse (!) 50   Temp 98.1 F (36.7 C)   SpO2 96%   Wt Readings from Last 3 Encounters:  06/18/19 278 lb (126.1 kg)  06/02/19 280 lb (127 kg)  04/23/19 281 lb 4 oz (127.6 kg)       Physical exam: Pulm: speaks in complete sentences without increased work of breathing     Assessment & Plan:   Problem List Items Addressed This Visit    Acute sinusitis    Anticipate bacterial given duration and progression of symptoms. Will cover with augmentin 10d course. Codeine allergy - will Rx hycodan cough syrup for night time - with hyperglycemia and sedation precautions. He is aware not to mix cough syrup with his vicodin (sparingly uses). Update if not improving with treatment, discussed possible COVID swab if worsening despite meds. Pt agrees with plan.       Relevant Medications   amoxicillin-clavulanate (AUGMENTIN) 875-125 MG tablet   HYDROcodone-homatropine (HYCODAN) 5-1.5 MG/5ML syrup       Meds  ordered this encounter  Medications  . DISCONTD: HYDROcodone-homatropine (HYCODAN) 5-1.5 MG/5ML syrup    Sig: Take 5 mLs by mouth 2 (two) times daily as needed for cough (sedation precautions).    Dispense:  120 mL    Refill:  0  . amoxicillin-clavulanate (AUGMENTIN) 875-125 MG tablet    Sig: Take 1 tablet by mouth 2 (two) times daily for 10 days.    Dispense:  20 tablet    Refill:  0  . HYDROcodone-homatropine (HYCODAN) 5-1.5 MG/5ML syrup    Sig: Take 5 mLs by mouth 2 (two) times daily as needed for cough (sedation precautions).    Dispense:  120 mL    Refill:  0   No orders of the defined types were placed in this encounter.  I discussed the assessment and treatment plan with the patient. The patient was provided an opportunity to ask questions and all were answered. The patient agreed with the plan and demonstrated an understanding of the instructions. The patient was advised to call back or seek an in-person evaluation if the symptoms worsen or if the condition fails to improve as anticipated.  Follow up plan: No follow-ups on file.  Ria Bush, MD

## 2019-07-01 ENCOUNTER — Other Ambulatory Visit: Payer: Self-pay | Admitting: Family Medicine

## 2019-07-01 NOTE — Telephone Encounter (Signed)
Last filled 05-28-19 #30 Last OV 04-23-19 No Future OV [facility]

## 2019-07-02 NOTE — Telephone Encounter (Signed)
ERx 

## 2019-07-09 ENCOUNTER — Other Ambulatory Visit: Payer: Self-pay | Admitting: Family Medicine

## 2019-07-13 ENCOUNTER — Ambulatory Visit (INDEPENDENT_AMBULATORY_CARE_PROVIDER_SITE_OTHER): Payer: Medicare Other

## 2019-07-13 ENCOUNTER — Other Ambulatory Visit: Payer: Self-pay

## 2019-07-13 DIAGNOSIS — E538 Deficiency of other specified B group vitamins: Secondary | ICD-10-CM | POA: Diagnosis not present

## 2019-07-13 MED ORDER — CYANOCOBALAMIN 1000 MCG/ML IJ SOLN
1000.0000 ug | Freq: Once | INTRAMUSCULAR | Status: AC
  Administered 2019-07-13: 1000 ug via INTRAMUSCULAR

## 2019-07-13 NOTE — Progress Notes (Signed)
Patient seen today for B12 injection. Cyanocobalamin 1011mcg give in deltoid.  Patient tolerated well and no concerns.

## 2019-07-14 NOTE — Progress Notes (Signed)
Noted  

## 2019-07-22 ENCOUNTER — Other Ambulatory Visit: Payer: Self-pay | Admitting: Endocrinology

## 2019-07-22 DIAGNOSIS — E119 Type 2 diabetes mellitus without complications: Secondary | ICD-10-CM

## 2019-07-23 ENCOUNTER — Other Ambulatory Visit: Payer: Self-pay

## 2019-07-23 ENCOUNTER — Telehealth: Payer: Self-pay

## 2019-07-23 DIAGNOSIS — E119 Type 2 diabetes mellitus without complications: Secondary | ICD-10-CM

## 2019-07-23 MED ORDER — GE100 BLOOD GLUCOSE TEST VI STRP: 1.0000 | ORAL_STRIP | Freq: Four times a day (QID) | 0 refills | Status: DC

## 2019-07-23 NOTE — Telephone Encounter (Signed)
Refill request received from [facility] electronically and was approved for refill. However, Rx continued to print rather than send electronically. Therefore, Rx has been signed by Dr. Loanne Drilling and faxed directly to [facility] for refill. Confirmation received.

## 2019-08-05 ENCOUNTER — Ambulatory Visit: Payer: Medicare Other

## 2019-08-05 ENCOUNTER — Other Ambulatory Visit: Payer: Self-pay

## 2019-08-05 DIAGNOSIS — Z794 Long term (current) use of insulin: Secondary | ICD-10-CM

## 2019-08-05 DIAGNOSIS — I10 Essential (primary) hypertension: Secondary | ICD-10-CM

## 2019-08-05 DIAGNOSIS — E1122 Type 2 diabetes mellitus with diabetic chronic kidney disease: Secondary | ICD-10-CM

## 2019-08-05 NOTE — Chronic Care Management (AMB) (Signed)
Chronic Care Management Pharmacy  Name: Donald [name]  MRN: [id number] DOB: October 09, 1945  Chief Complaint/ HPI  Donald [name],  74 y.o., male presents for their Follow-Up CCM visit with the clinical pharmacist via telephone.  PCP : Donald Bush, MD  Their chronic conditions addressed today include: hypertension, type 2 diabetes  Patient concerns: denies concerns  Last CCM visit 04/09/19, no medication changes  Office Visits:  06/23/19: Acute sinusitis - 10 day course Augmentin 06/18/19: Nasal congestion - resume allergy medications  Consult Visits:  06/02/19: Endocrinology -  BP elevated, f/u with PCP; continue insulin dosing as prescribed, take an extra 10 units for any BG > 200 after steroid injection   Allergies  Allergen Reactions  . Codeine Other (See Comments)    REACTION: chest pain  . Pioglitazone Other (See Comments)    REACTION to Actos: swelling in ankles    Current Outpatient Medications on File Prior to Visit  Medication Sig Dispense Refill  . allopurinol (ZYLOPRIM) 300 MG tablet TAKE ONE TABLET EVERY DAY 30 tablet 10  . ALPRAZolam (XANAX) 1 MG tablet TAKE ONE TABLET AT BEDTIME IF NEEDED FORSLEEP 30 tablet 0  . amLODipine (NORVASC) 5 MG tablet Take 1 tablet (5 mg total) by mouth daily. 90 tablet 3  . aspirin EC 81 MG tablet Take 1 tablet (81 mg total) by mouth daily.    . colchicine (COLCRYS) 0.6 MG tablet TAKE 1 TABLET BY MOUTH EVERY HOUR UNTIL SYMPTOMS ARE BETTER, MAX OF 6TABS PER DAY. STOP IF DIARRHEA 30 tablet 4  . cyanocobalamin (,VITAMIN B-12,) 1000 MCG/ML injection Inject 1,000 mcg into the muscle every 30 (thirty) days.    . Cyanocobalamin (B-12) 1000 MCG SUBL Place 1 tablet under the tongue daily.    . ferrous sulfate 324 (65 Fe) MG TBEC Take 1 tablet (325 mg total) by mouth daily. 30 tablet   . fluticasone (FLONASE) 50 MCG/ACT nasal spray Place 2 sprays into both nostrils daily. 16 g 1  . furosemide (LASIX) 80 MG tablet TAKE ONE TABLET (80 MG)  BY MOUTH EVERY DAY 90 tablet 3  . gabapentin (NEURONTIN) 600 MG tablet TAKE TWO TABLETS (1200MG ) BY MOUTH AT BEDTIME 180 tablet 3  . glucose blood (GE100 BLOOD GLUCOSE TEST) test strip 1 each by Other route 4 (four) times daily. E11.9 360 each 0  . HYDROcodone-acetaminophen (NORCO) 10-325 MG tablet Take 1 tablet by mouth every 6 (six) hours as needed for moderate pain ((typically once to twice daily if needed)).  30 tablet 0  . HYDROcodone-homatropine (HYCODAN) 5-1.5 MG/5ML syrup Take 5 mLs by mouth 2 (two) times daily as needed for cough (sedation precautions). 120 mL 0  . insulin NPH Human (HUMULIN N) 100 UNIT/ML injection Inject 2.9 mLs (290 Units total) into the skin at bedtime. 90 mL 5  . insulin regular (NOVOLIN R RELION) 100 units/mL injection 3 times a day (just before each meal), 210-150-290 units, and syringes 4/day. 210 mL 0  . losartan (COZAAR) 25 MG tablet TAKE ONE TABLET AT BEDTIME 90 tablet 3  . lovastatin (MEVACOR) 40 MG tablet TAKE TWO TABLETS BY MOUTH AT BEDTIME 180 tablet 3  . Naphazoline-Pheniramine (OPCON-A OP) Place 1 drop into both eyes 3 (three) times daily as needed (itchy eyes.).     Marland Kitchen ondansetron (ZOFRAN) 4 MG tablet Take 1 tablet (4 mg total) by mouth every 4 (four) hours as needed for nausea. 10 tablet 0  . Potassium Citrate 15 MEQ (1620 MG) TBCR Take  1 tablet by mouth in the morning, at noon, in the evening, and at bedtime.    [name] Sodium (STOOL SOFTENER & LAXATIVE PO) Take 2 tablets by mouth at bedtime.     No current facility-administered medications on file prior to visit.   Current Diagnosis/Assessment: Goals    . Patient Stated     04/20/2019, I will maintain and continue medications as prescribed.     . Pharmacy Care Plan     CARE PLAN ENTRY (see longitudinal plan of care for additional care plan information)  Current Barriers:  . Chronic Disease Management support, education, and care coordination needs related to Hypertension and  Diabetes   Hypertension BP Readings from Last 3 Encounters:  06/23/19 (!) 151/48  06/18/19 (!) 166/59  06/02/19 (!) 144/60 .  Pharmacist Clinical Goal(s): o Over the next 3 months, patient will work with PharmD and providers to achieve BP goal <140/90 mmHg . Current regimen:   Amlodipine 5 mg - 1 tablet daily   Losartan 25 mg - 1 tablet daily at bedtime . Interventions: o Comprehensive medication review o Recommend updating labs  . Patient self care activities - Over the next 3 months, patient will: o Continue to check blood pressure 2-3 times per month, document, and provide at future appointments o Ensure daily salt intake < 2300 mg/day  Diabetes Lab Results  Component Value Date/Time   HGBA1C 7.7 (H) 04/20/2019 10:26 AM   HGBA1C 7.3 (A) 02/10/2019 01:20 PM   HGBA1C 7.9 (H) 10/15/2018 02:11 PM .  Pharmacist Clinical Goal(s): o Over the next 30 days, patient will work with PharmD and providers to achieve glucose control with A1c less than 7%. Fasting blood glucose goal 80-130 mg/dL; 2 hours after meal goal of less than 180 mg/dL.  . Current regimen:   Humulin N (insulin NPH) - Inject 290 units daily at bedtime  Novolin R (insulin regular) -  Inject 170 units before lunch and 200 units before dinner (15 minutes before meals) . Interventions: o Discussed carbohydrate counting  o Recommend checking blood glucose before breakfast and 2 hours after first bite of each meal . Patient self care activities - Over the next 3 months, patient will: o Check blood sugar before breakfast and 2 hours after first bite of each meal, document, and provide at future appointment o Review carbohydrate counting handout o Contact provider with any episodes of hypoglycemia  Please see past updates related to this goal by clicking on the "Past Updates" button in the selected goal          Diabetes  Sees Donald [name] every 2-3 months   Reports diagnosed in 1324 Complications: polyneuropathy,  CAD and renal insufficiency Last steroid injection was May 2021, gets one every 4 months or so- reports BG was only elevated for 2-3 days after injection  Recent Relevant Labs: Lab Results  Component Value Date/Time   HGBA1C 7.7 (H) 04/20/2019 10:26 AM   HGBA1C 7.3 (A) 02/10/2019 01:20 PM   HGBA1C 7.9 (H) 10/15/2018 02:11 PM   MICROALBUR 12.0 (H) 03/02/2015 11:42 AM   MICROALBUR 2.6 (H) 01/27/2014 11:49 AM   A1c goal < 7%  Checking BG: 2-3x times per day, time of day varies  Fasting blood glucose: 140s, Highest: 220 Hypoglycemia: 55 yesterday (reports eating a smaller dinner yesterday than usual, denies any other lows since last visit)  Patient is currently uncontrolled on the following medications:   Humulin N (insulin NPH) - Inject 290 units qhs  Novolin R (insulin regular) -  Eating 2 meals/day, injects 170 units before lunch and 200 units before dinner (15 minutes before meals), occasionally takes an extra 20 units for high BG (> 200)  Last diabetic eye exam:  Lab Results  Component Value Date/Time   HMDIABEYEEXA Retinopathy (A) 05/21/2017 03:54 PM    Last diabetic foot exam:  Donald [name] checks his feet and ankles at every appt   Adherence: pt skips mealtime insulin if BG is < 110, would like to know if this is okay --> recommend only skipping mealtime insulin if not eating, try taking half of your insulin dose if pre-meal BG is < 110 and make sure to eat meal within 15 minutes.   Diet: Biggest challenge is desserts and sweet tea, trying to back on sweet tea (currenlty drinking twice a week at dinner), mixes sweet tea with diet grape juice; three nights a week has 2 scoops of ice cream, trying to cut back on bread  Breakfast: usually has biscuit from [employer] with steak or sausage and egg around 10 AM Dinner: spaghetti (1 order at [employer], splits with wife) and salad  We discussed: Pt is motivated to achieve A1c goal. We discussed limiting portion sizes,  eat smaller meals throughout the day, carb counting. Pt reports he used to have a carbohydrate guide and is interested in carb counting handout.  Plan: Continue current medications and close follow up with Donald [name]. Mail carbohydrate counting handout. Recommend checking BG 2 hours after meals in addition to fasting.   Hypertension   Office blood pressures are  BP Readings from Last 3 Encounters:  06/23/19 (!) 151/48  06/18/19 (!) 166/59  06/02/19 (!) 144/60   CMP Latest Ref Rng & Units 04/20/2019 11/04/2018 10/28/2018  Glucose 70 - 99 mg/dL 96 160(H) 132(H)  BUN 6 - 23 mg/dL 45(H) 25(H) 62(H)  Creatinine 0.40 - 1.50 mg/dL 1.95(H) 1.94(H) 2.51(H)  Sodium 135 - 145 mEq/L 144 143 142  Potassium 3.5 - 5.1 mEq/L 5.2(H) 4.6 4.4  Chloride 96 - 112 mEq/L 99 104 102  CO2 19 - 32 mEq/L 36(H) 31 31  Calcium 8.4 - 10.5 mg/dL 9.6 9.1 8.9  Total Protein 6.0 - 8.3 g/dL 6.5 - 6.1  Total Bilirubin 0.2 - 1.2 mg/dL 1.0 - 1.1  Alkaline Phos 39 - 117 U/L 80 - 80  AST 0 - 37 U/L 15 - 10  ALT 0 - 53 U/L 19 - 13   CrCl (adjbw): 44 mL/min   GFR (11/04/18): 34 mL/min Patient has failed these meds in the past: none  Patient checks BP at home: a couple times per month (arm cuff) Patient home BP readings are ranging: 130/60-70s mmHg   BP goal < 140/90 mmHg Patient is currently controlled on the following medications:   Amlodipine 5 mg - 1 tablet daily   Losartan 25 mg - 1 tablet daily at bedtime  Restarted losartan 12/20, recommend updated BMP due to elevated potassium 04/2019  Plan: Continue current medications   Medication Management  Pharmacy: [facility]   Adherence: uses pillbox, refills timely   Affordability: denies concerns  CCM Follow Up: 3 months, telephone   Debbora Dus, PharmD Clinical Pharmacist [facility] at [facility] [phone]

## 2019-08-05 NOTE — Chronic Care Management (AMB) (Deleted)
Chronic Care Management Pharmacy  Name: STRYDER POITRA  MRN: [id number] DOB: 08-Jun-1945  Chief Complaint/ HPI  Donald [name],  74 y.o., male presents for their Follow-Up CCM visit with the clinical pharmacist via telephone.  PCP : Ria Bush, MD  Their chronic conditions addressed today include: hypertension, type 2 diabetes, chronic gout   Patient concerns: going to orthopedic doctor on Monday for shoulder and back pain  Office Visits: none since last CCM on 03/12/19 06/23/19: Acute sinusitis - 10 day course Augmentin 06/18/19: Nasal congestion - resume allergy medications  Consult Visits:  06/02/19: Endocrinology -  BP elevated, f/u with PCP; continue insulin dosing as prescribed, take an extra 10 units for any BG > 200 after steroid injection   Current Diagnosis/Assessment: Goals    . Patient Stated     04/20/2019, I will maintain and continue medications as prescribed.     . Pharmacy Care Plan     Current Barriers:  . Chronic Disease Management support, education, and care coordination needs related to hypertension, coronary artery disease, heart failure, allergic rhinitis, GERD, chronic kidney disease, type 2 diabetes, kidney stone, dyslipidemia, iron deficiency anemia, depression, chronic insomnia, chronic gout, constipation  Pharmacist Clinical Goal(s):  . Remain up to date on vaccinations. Recommend tetanus vaccine (Tdap). . Effectively treat gout flares. At first sign of a gout flare, take 2 colchicine followed by an additional tablet 1 hour later, then twice daily for duration of flare (usually 3-5 days). Do not exceed 3 tablets in 24 hours.  . Achieve glucose control with A1c less than 7%. Continue to monitor blood glucose twice daily. Fasting blood glucose goal 80-130 mg/dL; within 1-2 hours of meal goal: less than 180 mg/dL. Please call with any elevations or readings less than 70 mg/dL.  Interventions: . Comprehensive medication review performed.  Patient  Self Care Activities:  . Self administers medications as prescribed  Please see past updates related to this goal by clicking on the "Past Updates" button in the selected goal        Diabetes  Sees Dr. Loanne Drilling every 2-3 months   Dx'ed: 6759 Complications: polyneuropathy, CAD and renal insufficiency Last steroid injection was May 2020, gets on every 4 months or so- reports BG was only elevated for 2-3 days after injection  Recent Relevant Labs: Lab Results  Component Value Date/Time   HGBA1C 7.7 (H) 04/20/2019 10:26 AM   HGBA1C 7.3 (A) 02/10/2019 01:20 PM   HGBA1C 7.9 (H) 10/15/2018 02:11 PM   MICROALBUR 12.0 (H) 03/02/2015 11:42 AM   MICROALBUR 2.6 (H) 01/27/2014 11:49 AM   A1c goal < 7% (would like to to achieve that) Checking BG: 2-3x times per day, varies  Hypoglycemia: 55 yesterday (denies any other lows this morning) Fasting blood glucose: 140 Highest: 220 - takes an extra dose of insulin 20 units   Usually eats 2 meals a day, only takes his insulin before meals Patient is currently uncontrolled on the following medications:   Humulin N (insulin NPH) - Inject 290 units qHS  Novolin R (insulin regular) -  Eating 2 meals/day - skips breakfast, 170 before lunch, 200 before dinner 15 minutes before meals*  Last diabetic eye exam:  Lab Results  Component Value Date/Time   HMDIABEYEEXA Retinopathy (A) 05/21/2017 03:54 PM    Last diabetic foot exam:  Dr. Loanne Drilling checks his feet and ankles at every appt   Diet - desserts, diet sodas, sweet tea , trying to back off sweet tea, usually  only drinks it at dinner time, keeps him up a night, mixes sweet with diet grape juice, caffeine free tea Twice a week - has sweet tea with diet grape juice at dinner Three nights a week - 2 scoops of ice cream   Discussed trying to find alternatives Cutting back on breads - usually has biscuit for breakfast from [employer] with steak or sausauge, egg 10 AM - checked BG 135 (2:30-3  PM) Ate peanut butter jelly around 2 PM Dinner - spaghetti (1 order at [facility] ribbon and split) and salad  Discussed portion sizes, eat smaller meals throughout the day Checks BG**  Check BG before brunch, before dinner, 2 hours after dinner  Writes them down   AVS - carb counting will, mail order   90-110: half of your usual dose 2 hours later  Check BG - 3x/day, if feels low, will check again if vision blurred, shaky - low   Plan: Continue current medications and close follow up with Dr. Loanne Drilling. Fasting blood glucose is within goal and no hypoglycemia.   Hypertension   Office blood pressures are  BP Readings from Last 3 Encounters:  06/23/19 (!) 151/48  06/18/19 (!) 166/59  06/02/19 (!) 144/60   CMP Latest Ref Rng & Units 04/20/2019 11/04/2018 10/28/2018  Glucose 70 - 99 mg/dL 96 160(H) 132(H)  BUN 6 - 23 mg/dL 45(H) 25(H) 62(H)  Creatinine 0.40 - 1.50 mg/dL 1.95(H) 1.94(H) 2.51(H)  Sodium 135 - 145 mEq/L 144 143 142  Potassium 3.5 - 5.1 mEq/L 5.2(H) 4.6 4.4  Chloride 96 - 112 mEq/L 99 104 102  CO2 19 - 32 mEq/L 36(H) 31 31  Calcium 8.4 - 10.5 mg/dL 9.6 9.1 8.9  Total Protein 6.0 - 8.3 g/dL 6.5 - 6.1  Total Bilirubin 0.2 - 1.2 mg/dL 1.0 - 1.1  Alkaline Phos 39 - 117 U/L 80 - 80  AST 0 - 37 U/L 15 - 10  ALT 0 - 53 U/L 19 - 13   CrCl (adjbw): 44 mL/min   GFR (11/04/18): 34 mL/min Patient has failed these meds in the past: none  Patient checks BP at home: a couple times per month (arm cuff) Patient home BP readings are ranging: still good, around 130/60-70s mmHg   Patient is currently controlled on the following medications:   Amlodipine 5 mg - 1 tablet daily   Losartan 25 mg - 1 tablet daily at bedtime  We discussed: pt resumed losartan 12/20, had labs done elsewhere but not in Epic yet  Plan: Continue current medications   Gout   Symptoms: left foot pain has resolved over the past week Recent flares: flare lasted several months 01/20 Uric acid (11/04/18):  5.0 Patient has tried these meds in past: Biofreeze - some relief Patient is currently controlled on the following medications:   Allopurinol 300 mg - 1 tablet daily after meal  Colchicine 0.6 mg - 1 tablet PRN every hours four gout flare, max 6 tabs/24 hour   OTC aspercreme (lidocaine), lidocaine patches - PRN  Capsaicin cream - apply 3-4 times daily for pain  CrCl > 30 ml/min, no dose adjustments recommended for renal function with allopurinol or colchicine.   Plan: Continue current medications. Recommend max dose of colchicine of 1.8 mg per day; 2 tablets at first sign of gout flare, then twice daily until resolved.   Medication Management  Pharmacy: [facility] ([facility]: [facility])  Adherence: pillbox  Affordability: did not report any concerns  Vaccines: Recommend Tdap  CCM Follow Up: August 06, 2019 at 3:30 PM (telephone)  Debbora Dus, PharmD Clinical Pharmacist [facility] at [facility] [phone]

## 2019-08-06 ENCOUNTER — Telehealth: Payer: Medicare Other

## 2019-08-10 DIAGNOSIS — H6063 Unspecified chronic otitis externa, bilateral: Secondary | ICD-10-CM | POA: Diagnosis not present

## 2019-08-14 NOTE — Patient Instructions (Signed)
Dear Donald [name],  Below is a summary of the goals we discussed during our follow up appointment on August 05, 2019. Please contact me anytime with questions or concerns.   Visit Information  Goals Addressed            This Visit's Progress   . Pharmacy Care Plan       CARE PLAN ENTRY (see longitudinal plan of care for additional care plan information)  Current Barriers:  . Chronic Disease Management support, education, and care coordination needs related to Hypertension and Diabetes   Hypertension BP Readings from Last 3 Encounters:  06/23/19 (!) 151/48  06/18/19 (!) 166/59  06/02/19 (!) 144/60 .  Pharmacist Clinical Goal(s): o Over the next 3 months, patient will work with PharmD and providers to achieve BP goal <140/90 mmHg . Current regimen:   Amlodipine 5 mg - 1 tablet daily   Losartan 25 mg - 1 tablet daily at bedtime . Interventions: o Comprehensive medication review o Recommend updating labs  . Patient self care activities - Over the next 3 months, patient will: o Continue to check blood pressure 2-3 times per month, document, and provide at future appointments o Ensure daily salt intake < 2300 mg/day  Diabetes Lab Results  Component Value Date/Time   HGBA1C 7.7 (H) 04/20/2019 10:26 AM   HGBA1C 7.3 (A) 02/10/2019 01:20 PM   HGBA1C 7.9 (H) 10/15/2018 02:11 PM .  Pharmacist Clinical Goal(s): o Over the next 30 days, patient will work with PharmD and providers to achieve glucose control with A1c less than 7%. Fasting blood glucose goal 80-130 mg/dL; 2 hours after meal goal of less than 180 mg/dL.  . Current regimen:   Humulin N (insulin NPH) - Inject 290 units daily at bedtime  Novolin R (insulin regular) -  Inject 170 units before lunch and 200 units before dinner (15 minutes before meals) . Interventions: o Discussed carbohydrate counting  o Recommend checking blood glucose before breakfast and 2 hours after first bite of each meal . Patient self care  activities - Over the next 3 months, patient will: o Check blood sugar before breakfast and 2 hours after first bite of each meal, document, and provide at future appointment o Review carbohydrate counting handout o Contact provider with any episodes of hypoglycemia  Please see past updates related to this goal by clicking on the "Past Updates" button in the selected goal       The patient verbalized understanding of instructions provided today and agreed to receive a mailed copy of patient instruction and/or educational materials.  Telephone follow up appointment with pharmacy team member scheduled for: 11/05/19 at 10 AM (telephone visit)  Debbora Dus, PharmD Clinical Pharmacist [facility] at [facility] [phone]  Carbohydrate Counting for Diabetes Mellitus, Adult  Carbohydrate counting is a method of keeping track of how many carbohydrates you eat. Eating carbohydrates naturally increases the amount of sugar (glucose) in the blood. Counting how many carbohydrates you eat helps keep your blood glucose within normal limits, which helps you manage your diabetes (diabetes mellitus). It is important to know how many carbohydrates you can safely have in each meal. This is different for every person. A diet and nutrition specialist (registered dietitian) can help you make a meal plan and calculate how many carbohydrates you should have at each meal and snack. Carbohydrates are found in the following foods:  Grains, such as breads and cereals.  Dried beans and soy products.  Starchy vegetables,  such as potatoes, peas, and corn.  Fruit and fruit juices.  Milk and yogurt.  Sweets and snack foods, such as cake, cookies, candy, chips, and soft drinks. How do I count carbohydrates? There are two ways to count carbohydrates in food. You can use either of the methods or a combination of both. Reading "Nutrition Facts" on packaged food The "Nutrition Facts" list is included  on the labels of almost all packaged foods and beverages in the [place] It includes:  The serving size.  Information about nutrients in each serving, including the grams (g) of carbohydrate per serving. To use the "Nutrition Facts":  Decide how many servings you will have.  Multiply the number of servings by the number of carbohydrates per serving.  The resulting number is the total amount of carbohydrates that you will be having. Learning standard serving sizes of other foods When you eat carbohydrate foods that are not packaged or do not include "Nutrition Facts" on the label, you need to measure the servings in order to count the amount of carbohydrates:  Measure the foods that you will eat with a food scale or measuring cup, if needed.  Decide how many standard-size servings you will eat.  Multiply the number of servings by 15. Most carbohydrate-rich foods have about 15 g of carbohydrates per serving. ? For example, if you eat 8 oz (170 g) of strawberries, you will have eaten 2 servings and 30 g of carbohydrates (2 servings x 15 g = 30 g).  For foods that have more than one food mixed, such as soups and casseroles, you must count the carbohydrates in each food that is included. The following list contains standard serving sizes of common carbohydrate-rich foods. Each of these servings has about 15 g of carbohydrates:   hamburger bun or  English muffin.   oz (15 mL) syrup.   oz (14 g) jelly.  1 slice of bread.  1 six-inch tortilla.  3 oz (85 g) cooked rice or pasta.  4 oz (113 g) cooked dried beans.  4 oz (113 g) starchy vegetable, such as peas, corn, or potatoes.  4 oz (113 g) hot cereal.  4 oz (113 g) mashed potatoes or  of a large baked potato.  4 oz (113 g) canned or frozen fruit.  4 oz (120 mL) fruit juice.  4-6 crackers.  6 chicken nuggets.  6 oz (170 g) unsweetened dry cereal.  6 oz (170 g) plain fat-free yogurt or yogurt sweetened with artificial  sweeteners.  8 oz (240 mL) milk.  8 oz (170 g) fresh fruit or one small piece of fruit.  24 oz (680 g) popped popcorn. Example of carbohydrate counting Sample meal  3 oz (85 g) chicken breast.  6 oz (170 g) brown rice.  4 oz (113 g) corn.  8 oz (240 mL) milk.  8 oz (170 g) strawberries with sugar-free whipped topping. Carbohydrate calculation 1. Identify the foods that contain carbohydrates: ? Rice. ? Corn. ? Milk. ? Strawberries. 2. Calculate how many servings you have of each food: ? 2 servings rice. ? 1 serving corn. ? 1 serving milk. ? 1 serving strawberries. 3. Multiply each number of servings by 15 g: ? 2 servings rice x 15 g = 30 g. ? 1 serving corn x 15 g = 15 g. ? 1 serving milk x 15 g = 15 g. ? 1 serving strawberries x 15 g = 15 g. 4. Add together all of the amounts to find  the total grams of carbohydrates eaten: ? 30 g + 15 g + 15 g + 15 g = 75 g of carbohydrates total. Summary  Carbohydrate counting is a method of keeping track of how many carbohydrates you eat.  Eating carbohydrates naturally increases the amount of sugar (glucose) in the blood.  Counting how many carbohydrates you eat helps keep your blood glucose within normal limits, which helps you manage your diabetes.  A diet and nutrition specialist (registered dietitian) can help you make a meal plan and calculate how many carbohydrates you should have at each meal and snack. This information is not intended to replace advice given to you by your health care provider. Make sure you discuss any questions you have with your health care provider. Document Revised: 07/25/2016 Document Reviewed: 06/14/2015 Elsevier Patient Education  [place].

## 2019-08-15 NOTE — Progress Notes (Signed)
I have collaborated with the care management provider regarding care management and care coordination activities outlined in this encounter and have reviewed this encounter including documentation in the note and care plan. I am certifying that I agree with the content of this note and encounter as supervising physician.  

## 2019-08-17 ENCOUNTER — Ambulatory Visit (INDEPENDENT_AMBULATORY_CARE_PROVIDER_SITE_OTHER): Payer: Medicare Other | Admitting: *Deleted

## 2019-08-17 ENCOUNTER — Other Ambulatory Visit: Payer: Self-pay

## 2019-08-17 DIAGNOSIS — E538 Deficiency of other specified B group vitamins: Secondary | ICD-10-CM | POA: Diagnosis not present

## 2019-08-17 MED ORDER — CYANOCOBALAMIN 1000 MCG/ML IJ SOLN
1000.0000 ug | Freq: Once | INTRAMUSCULAR | Status: AC
  Administered 2019-08-17: 1000 ug via INTRAMUSCULAR

## 2019-08-17 NOTE — Progress Notes (Signed)
Per orders of Dr. Gutierrez, injection of B12 given by Mashal Slavick M. Patient tolerated injection well. 

## 2019-08-23 DIAGNOSIS — E1140 Type 2 diabetes mellitus with diabetic neuropathy, unspecified: Secondary | ICD-10-CM | POA: Diagnosis not present

## 2019-08-23 DIAGNOSIS — M5136 Other intervertebral disc degeneration, lumbar region: Secondary | ICD-10-CM | POA: Diagnosis not present

## 2019-08-23 DIAGNOSIS — Z79891 Long term (current) use of opiate analgesic: Secondary | ICD-10-CM | POA: Diagnosis not present

## 2019-09-02 ENCOUNTER — Ambulatory Visit: Payer: Medicare Other | Admitting: Endocrinology

## 2019-09-14 DIAGNOSIS — M5136 Other intervertebral disc degeneration, lumbar region: Secondary | ICD-10-CM | POA: Diagnosis not present

## 2019-09-22 DIAGNOSIS — N200 Calculus of kidney: Secondary | ICD-10-CM | POA: Diagnosis not present

## 2019-09-27 ENCOUNTER — Other Ambulatory Visit: Payer: Self-pay | Admitting: Family Medicine

## 2019-09-27 NOTE — Telephone Encounter (Signed)
Refill request Gabapentin Last refill 09/11/18 #180/3 Last office visit 06/23/19

## 2019-09-28 ENCOUNTER — Other Ambulatory Visit: Payer: Self-pay

## 2019-09-28 ENCOUNTER — Ambulatory Visit (INDEPENDENT_AMBULATORY_CARE_PROVIDER_SITE_OTHER): Payer: Medicare Other

## 2019-09-28 ENCOUNTER — Encounter (INDEPENDENT_AMBULATORY_CARE_PROVIDER_SITE_OTHER): Payer: Self-pay

## 2019-09-28 ENCOUNTER — Ambulatory Visit (INDEPENDENT_AMBULATORY_CARE_PROVIDER_SITE_OTHER): Payer: Medicare Other | Admitting: Podiatry

## 2019-09-28 DIAGNOSIS — M19072 Primary osteoarthritis, left ankle and foot: Secondary | ICD-10-CM

## 2019-09-28 DIAGNOSIS — M85572 Aneurysmal bone cyst, left ankle and foot: Secondary | ICD-10-CM

## 2019-09-28 DIAGNOSIS — M778 Other enthesopathies, not elsewhere classified: Secondary | ICD-10-CM | POA: Diagnosis not present

## 2019-09-29 MED ORDER — METHYLPREDNISOLONE 4 MG PO TABS: ORAL_TABLET | ORAL | 0 refills | Status: DC

## 2019-09-29 NOTE — Progress Notes (Signed)
HPI: 74 y.o. male presenting today as a new patient for evaluation of left foot pain.  PMHx uncontrolled diabetes type 2 with peripheral polyneuropathy, chronic CKD.  Patient states that he has been experiencing left foot pain for several months now.  It is intermittent.  The pain is on the outside of his foot.  He states that it aches more at night.  He has not done anything for treatment.  He presents for further treatment and evaluation  Past Medical History:  Diagnosis Date  . (HFpEF) heart failure with preserved ejection fraction ([place])    a. 09/2018 Echo: EF 60-65%. PASP 25mmHg. Mild-mod LAE. Mild MR/TR.  Marland Kitchen ANEMIA-IRON DEFICIENCY 07/26/2006  . Anxiety   . ASTHMA 07/26/2006  . Asthma   . Back pain, chronic   . Bell's palsy   . CKD (chronic kidney disease), stage III   . COLONIC POLYPS, HX OF 07/26/2006  . DEPRESSION 03/14/2009  . DIABETES MELLITUS, TYPE II 07/26/2006  . [place] DISEASE, LUMBAR 10/05/2007  . DVT 12/03/2007  . DYSLIPIDEMIA 04/13/2009  . Dyspnea    when gets up and walks around and back is hurting really bad -only Shortness of breath  then  . GERD 07/26/2006  . Gout   . Heart murmur   . History of kidney stones   . HYPERTENSION 07/26/2006  . INSOMNIA 08/21/2007  . Neuropathy   . Nonobstructive CAD (coronary artery disease)    a. 2012 Cath: no high grade stenosis; b. 2018 MV: No ischemia. Attenuation artifact.   . OBSTRUCTIVE SLEEP APNEA 12/03/2007   Use C-PAP  . PERIPHERAL NEUROPATHY 07/26/2006  . Pernicious anemia 11/20/2006  . PULMONARY EMBOLISM 10/05/2007     Physical Exam: General: The patient is alert and oriented x3 in no acute distress.  Dermatology: Skin is warm, dry and supple bilateral lower extremities. Negative for open lesions or macerations.  Vascular: Palpable pedal pulses bilaterally. No edema or erythema noted. Capillary refill within normal limits.  Neurological: Epicritic and protective threshold grossly intact bilaterally.   Musculoskeletal Exam:  Range of motion within normal limits to all pedal and ankle joints bilateral. Muscle strength 5/5 in all groups bilateral.  There is some pain on palpation noted to the lateral aspect of the left foot at the tarsometatarsal joint, specifically the fifth metatarsal cuboid joint.  This correlates with the degenerative changes noted on radiographic exam.  The pain does not correlate to the area of the suspected aneurysmal bone cyst.  Radiographic Exam:  Normal osseous mineralization.  There are some joint space narrowing and articular degenerative changes of the fifth metatarsal cuboid joint.  There is a cortical irregularity along the joint.  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  It appears well-circumscribed and chronic in nature.  This does not correlate to the patient's symptoms.  No pathologic fracture identified.  There may be some chronic old pathologic fractures in the past that have healed.  Assessment: 1.  DJD/capsulitis tarsometatarsal joint lateral left foot 2.  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 Patient evaluated. X-Rays reviewed.  2.  Injection of 0.5 cc Celestone Soluspan injection to the lateral aspect of the TMT joint left foot 3.  Prescription for Medrol Dosepak 4.  No NSAIDs prescribed due to CKD 5.  Recommend good supportive walking shoes. 6.  Return to clinic in 4 weeks      Edrick Kins, DPM  [facility]  Dr. Edrick Kins, DPM    [address], [place] [address]                Office [phone]  Fax [phone]

## 2019-10-07 ENCOUNTER — Other Ambulatory Visit: Payer: Self-pay

## 2019-10-07 ENCOUNTER — Ambulatory Visit (INDEPENDENT_AMBULATORY_CARE_PROVIDER_SITE_OTHER): Payer: Medicare Other | Admitting: Endocrinology

## 2019-10-07 VITALS — BP 158/60 | HR 59 | Ht 64.0 in | Wt 273.0 lb

## 2019-10-07 DIAGNOSIS — N1832 Chronic kidney disease, stage 3b: Secondary | ICD-10-CM

## 2019-10-07 DIAGNOSIS — Z794 Long term (current) use of insulin: Secondary | ICD-10-CM | POA: Diagnosis not present

## 2019-10-07 DIAGNOSIS — E1121 Type 2 diabetes mellitus with diabetic nephropathy: Secondary | ICD-10-CM | POA: Diagnosis not present

## 2019-10-07 LAB — POCT GLYCOSYLATED HEMOGLOBIN (HGB A1C): Hemoglobin A1C: 7.2 % — AB (ref 4.0–5.6)

## 2019-10-07 NOTE — Progress Notes (Signed)
Subjective:    Patient ID: Donald [name], male    DOB: [birth year], 74 y.o.   MRN: [id number]  HPI Pt returns for f/u of diabetes mellitus: DM type: Insulin-requiring type 2 Dx'ed: 3500 Complications: polyneuropathy, CAD and stage 4 CRI.   Therapy: insulin since 1995 DKA: never.  Severe hypoglycemia: never.  Pancreatitis: never.  SDOH: he declines name brand meds.  Other: he takes multiple daily injections; he declines weight-loss surgery; he has severe insulin resistance; he declines pump; He often eats 2 meals per day, so he takes only 2 injections of novolog.  Interval history: He brings a record of his cbg's which I have reviewed today, checked BID.  cbg varies from 67-248.  There is no trend throughout the day.  Last steroid shot was 3 weeks ago.  He has mild hypoglycemia approx twice per month.   Past Medical History:  Diagnosis Date  . (HFpEF) heart failure with preserved ejection fraction ([place])    a. 09/2018 Echo: EF 60-65%. PASP 28mmHg. Mild-mod LAE. Mild MR/TR.  Marland Kitchen ANEMIA-IRON DEFICIENCY 07/26/2006  . Anxiety   . ASTHMA 07/26/2006  . Asthma   . Back pain, chronic   . Bell's palsy   . CKD (chronic kidney disease), stage III   . COLONIC POLYPS, HX OF 07/26/2006  . DEPRESSION 03/14/2009  . DIABETES MELLITUS, TYPE II 07/26/2006  . [place] DISEASE, LUMBAR 10/05/2007  . DVT 12/03/2007  . DYSLIPIDEMIA 04/13/2009  . Dyspnea    when gets up and walks around and back is hurting really bad -only Shortness of breath  then  . GERD 07/26/2006  . Gout   . Heart murmur   . History of kidney stones   . HYPERTENSION 07/26/2006  . INSOMNIA 08/21/2007  . Neuropathy   . Nonobstructive CAD (coronary artery disease)    a. 2012 Cath: no high grade stenosis; b. 2018 MV: No ischemia. Attenuation artifact.   . OBSTRUCTIVE SLEEP APNEA 12/03/2007   Use C-PAP  . PERIPHERAL NEUROPATHY 07/26/2006  . Pernicious anemia 11/20/2006  . PULMONARY EMBOLISM 10/05/2007    Past Surgical History:  Procedure  Laterality Date  . APPENDECTOMY  1968  . [place], 06/14/2008   Dr. Trenton Gammon at [facility] (06/10)  . CARDIAC CATHETERIZATION    . CARPAL TUNNEL RELEASE Right 10/2017  . CATARACT EXTRACTION W/PHACO Left 05/30/2014   Procedure: CATARACT EXTRACTION PHACO AND INTRAOCULAR LENS PLACEMENT (IOC);  Surgeon: Estill Cotta, MD;  Location: [facility] ORS;  Service: Ophthalmology;  Laterality: Left;  [name] 01:20 AP% 23.7 CDE 31.86  . COLONOSCOPY  10/2002   HP, SSA, TA, rpt 3 yrs ([place])  . COLONOSCOPY  01/2005   diverticulosis rpt 5 yrs ([place])   . IR NEPHROSTOMY PLACEMENT LEFT  10/19/2018  . IR URETERAL STENT LEFT NEW ACCESS W/O SEP NEPHROSTOMY CATH  10/19/2018  . LITHOTRIPSY     X 2  . NEPHROLITHOTOMY Left 10/19/2018   Procedure: NEPHROLITHOTOMY PERCUTANEOUS;  Surgeon: Franchot Gallo, MD;  Location: [facility] ORS;  Service: Urology;  Laterality: Left;  3 HRS  . PARATHYROIDECTOMY Right 04/18/2016   PARATHYROIDECTOMY for cyst [name], MD)  . SHOULDER ARTHROSCOPY W/ ROTATOR CUFF REPAIR Right   . TONSILLECTOMY      Social History   Socioeconomic History  . Marital status: Married    Spouse name: Not on file  . Number of children: Not on file  . Years of education: Not on file  . Highest education level: Not on file  Occupational History  . Occupation: Disabled    Employer: [employer]  Tobacco Use  . Smoking status: Never Smoker  . Smokeless tobacco: Never Used  Vaping Use  . Vaping Use: Never used  Substance and Sexual Activity  . Alcohol use: No  . Drug use: No  . Sexual activity: Not on file  Other Topics Concern  . 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   Activity is severely limited by medical problems   Diet is "good".   Never a smoker   Alcohol: none         Social Determinants of Health   Financial Resource Strain: Low Risk   . Difficulty of Paying Living Expenses: Not hard at  all  Food Insecurity: No Food Insecurity  . Worried About Charity fundraiser in the Last Year: Never true  . Ran Out of Food in the Last Year: Never true  Transportation Needs: No Transportation Needs  . Lack of Transportation (Medical): No  . Lack of Transportation (Non-Medical): No  Physical Activity: Inactive  . Days of Exercise per Week: 0 days  . Minutes of Exercise per Session: 0 min  Stress: No Stress Concern Present  . Feeling of Stress : Not at all  Social Connections:   . Frequency of Communication with Friends and Family: Not on file  . Frequency of Social Gatherings with Friends and Family: Not on file  . Attends Religious Services: Not on file  . Active Member of Clubs or Organizations: Not on file  . Attends Archivist Meetings: Not on file  . Marital Status: Not on file  Intimate Partner Violence: Not At Risk  . Fear of Current or Ex-Partner: No  . Emotionally Abused: No  . Physically Abused: No  . Sexually Abused: No    Current Outpatient Medications on File Prior to Visit  Medication Sig Dispense Refill  . allopurinol (ZYLOPRIM) 300 MG tablet TAKE ONE TABLET EVERY DAY 30 tablet 10  . ALPRAZolam (XANAX) 1 MG tablet TAKE ONE TABLET AT BEDTIME IF NEEDED FORSLEEP 30 tablet 0  . aspirin EC 81 MG tablet Take 1 tablet (81 mg total) by mouth daily.    . colchicine (COLCRYS) 0.6 MG tablet TAKE 1 TABLET BY MOUTH EVERY HOUR UNTIL SYMPTOMS ARE BETTER, MAX OF 6TABS PER DAY. STOP IF DIARRHEA 30 tablet 4  . cyanocobalamin (,VITAMIN B-12,) 1000 MCG/ML injection Inject 1,000 mcg into the muscle every 30 (thirty) days.    . Cyanocobalamin (B-12) 1000 MCG SUBL Place 1 tablet under the tongue daily.    . ferrous sulfate 324 (65 Fe) MG TBEC Take 1 tablet (325 mg total) by mouth daily. 30 tablet   . fluticasone (FLONASE) 50 MCG/ACT nasal spray Place 2 sprays into both nostrils daily. 16 g 1  . furosemide (LASIX) 80 MG tablet TAKE ONE TABLET (80 MG) BY MOUTH EVERY DAY 90  tablet 3  . gabapentin (NEURONTIN) 600 MG tablet TAKE TWO TABLETS AT BEDTIME 180 tablet 3  . glucose blood (GE100 BLOOD GLUCOSE TEST) test strip 1 each by Other route 4 (four) times daily. E11.9 360 each 0  . HYDROcodone-acetaminophen (NORCO) 10-325 MG tablet Take 1 tablet by mouth every 6 (six) hours as needed for moderate pain ((typically once to twice daily if needed)).  30 tablet 0  . HYDROcodone-homatropine (HYCODAN) 5-1.5 MG/5ML syrup Take 5 mLs by mouth 2 (two) times  daily as needed for cough (sedation precautions). 120 mL 0  . insulin NPH Human (HUMULIN N) 100 UNIT/ML injection Inject 2.9 mLs (290 Units total) into the skin at bedtime. 90 mL 5  . insulin regular (NOVOLIN R RELION) 100 units/mL injection 3 times a day (just before each meal), 210-150-290 units, and syringes 4/day. 210 mL 0  . losartan (COZAAR) 25 MG tablet TAKE ONE TABLET AT BEDTIME 90 tablet 3  . lovastatin (MEVACOR) 40 MG tablet TAKE TWO TABLETS BY MOUTH AT BEDTIME 180 tablet 3  . methylPREDNISolone (MEDROL) 4 MG tablet Take as directed 21 tablet 0  . Naphazoline-Pheniramine (OPCON-A OP) Place 1 drop into both eyes 3 (three) times daily as needed (itchy eyes.).     Marland Kitchen ondansetron (ZOFRAN) 4 MG tablet Take 1 tablet (4 mg total) by mouth every 4 (four) hours as needed for nausea. 10 tablet 0  . Potassium Citrate 15 MEQ (1620 MG) TBCR Take 1 tablet by mouth in the morning, at noon, in the evening, and at bedtime.    [name] Sodium (STOOL SOFTENER & LAXATIVE PO) Take 2 tablets by mouth at bedtime.    Marland Kitchen amLODipine (NORVASC) 5 MG tablet Take 1 tablet (5 mg total) by mouth daily. 90 tablet 3   No current facility-administered medications on file prior to visit.    Allergies  Allergen Reactions  . Codeine Other (See Comments)    REACTION: chest pain  . Pioglitazone Other (See Comments)    REACTION to Actos: swelling in ankles    Family History  Problem Relation Age of Onset  . Cancer Mother        Breast  Cancer  . Cancer Sister        Breast Cancer  . Diabetes Father   . Heart disease Father   . Diabetes Paternal Grandfather     BP (!) 158/60   Pulse (!) 59   Ht 5\' 4"  (1.626 m)   Wt 273 lb (123.8 kg)   SpO2 94%   BMI 46.86 kg/m    Review of Systems Denies LOC.      Objective:   Physical Exam VITAL SIGNS:  See vs page GENERAL: no distress Pulses: dorsalis pedis intact bilat.   MSK: no deformity of the feet CV: 2+ bilat leg edema Skin:  no ulcer on the feet.  normal color and temp on the feet. Neuro: sensation is intact to touch on the feet   Lab Results  Component Value Date   HGBA1C 7.2 (A) 10/07/2019       Assessment & Plan:  HTN: is noted today Insulin-requiring type 2 DM, with stage 4 CRI Hypoglycemia, due to insulin: this limits aggressiveness of glycemic control   Patient Instructions  Your blood pressure is high today.  Please see your primary care provider soon, to have it rechecked.   Please continue the same insulins.  After any steroid injection, take 10 extra units for any blood sugar over 200. check your blood sugar twice a day.  vary the time of day when you check, between before the 3 meals, and at bedtime.  also check if you have symptoms of your blood sugar being too high or too low.  please keep a record of the readings and bring it to your next appointment here (or you can bring the meter itself).  You can write it on any piece of paper.  please call us sooner if your blood sugar goes below 70, or if you  have a lot of readings over 200.  Please come back for a follow-up appointment in 4 months.

## 2019-10-07 NOTE — Patient Instructions (Addendum)
Your blood pressure is high today.  Please see your primary care provider soon, to have it rechecked.   Please continue the same insulins.  After any steroid injection, take 10 extra units for any blood sugar over 200. check your blood sugar twice a day.  vary the time of day when you check, between before the 3 meals, and at bedtime.  also check if you have symptoms of your blood sugar being too high or too low.  please keep a record of the readings and bring it to your next appointment here (or you can bring the meter itself).  You can write it on any piece of paper.  please call us sooner if your blood sugar goes below 70, or if you have a lot of readings over 200.  Please come back for a follow-up appointment in 4 months.

## 2019-10-15 ENCOUNTER — Ambulatory Visit (INDEPENDENT_AMBULATORY_CARE_PROVIDER_SITE_OTHER): Payer: Medicare Other | Admitting: Podiatry

## 2019-10-15 ENCOUNTER — Other Ambulatory Visit: Payer: Self-pay

## 2019-10-15 ENCOUNTER — Encounter: Payer: Self-pay | Admitting: Podiatry

## 2019-10-15 DIAGNOSIS — M19072 Primary osteoarthritis, left ankle and foot: Secondary | ICD-10-CM | POA: Diagnosis not present

## 2019-10-15 DIAGNOSIS — M778 Other enthesopathies, not elsewhere classified: Secondary | ICD-10-CM

## 2019-10-15 NOTE — Progress Notes (Signed)
   HPI: 74 y.o. male presenting today for follow-up evaluation of left lateral foot pain.  Patient states that the injection helped significantly.  He did have some days since last visit where the pain had recurred.  He is tried topical Biofreeze with minimal relief.  He also takes gabapentin at bedtime.  He presents for further treatment evaluation.  No new complaints at this time.  Past Medical History:  Diagnosis Date  . (HFpEF) heart failure with preserved ejection fraction ([place])    a. 09/2018 Echo: EF 60-65%. PASP 55mmHg. Mild-mod LAE. Mild MR/TR.  Marland Kitchen ANEMIA-IRON DEFICIENCY 07/26/2006  . Anxiety   . ASTHMA 07/26/2006  . Asthma   . Back pain, chronic   . Bell's palsy   . CKD (chronic kidney disease), stage III ([place])   . COLONIC POLYPS, HX OF 07/26/2006  . DEPRESSION 03/14/2009  . DIABETES MELLITUS, TYPE II 07/26/2006  . [place] DISEASE, LUMBAR 10/05/2007  . DVT 12/03/2007  . DYSLIPIDEMIA 04/13/2009  . Dyspnea    when gets up and walks around and back is hurting really bad -only Shortness of breath  then  . GERD 07/26/2006  . Gout   . Heart murmur   . History of kidney stones   . HYPERTENSION 07/26/2006  . INSOMNIA 08/21/2007  . Neuropathy   . Nonobstructive CAD (coronary artery disease)    a. 2012 Cath: no high grade stenosis; b. 2018 MV: No ischemia. Attenuation artifact.   . OBSTRUCTIVE SLEEP APNEA 12/03/2007   Use C-PAP  . PERIPHERAL NEUROPATHY 07/26/2006  . Pernicious anemia 11/20/2006  . PULMONARY EMBOLISM 10/05/2007     Physical Exam: General: The patient is alert and oriented x3 in no acute distress.  Dermatology: Skin is warm, dry and supple bilateral lower extremities. Negative for open lesions or macerations.  Vascular: Palpable pedal pulses bilaterally. No edema or erythema noted. Capillary refill within normal limits.  Neurological: Epicritic and protective threshold grossly intact bilaterally.   Musculoskeletal Exam: Range of motion within normal limits to all pedal and  ankle joints bilateral. Muscle strength 5/5 in all groups bilateral.  There is some pain on palpation noted to the lateral aspect of the left foot at the tarsometatarsal joint, specifically the fifth metatarsal cuboid joint.  This correlates with the degenerative changes noted on radiographic exam.  The pain does not correlate to the area of the suspected aneurysmal bone cyst.  Assessment: 1.  DJD/capsulitis tarsometatarsal joint lateral left foot 2.  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 Patient evaluated.  2.  Injection of 0.5 cc Celestone Soluspan injection to the lateral aspect of the TMT joint left foot 3.  No NSAIDs prescribed due to CKD 4.  Recommend good supportive walking shoes. 5.  Continue gabapentin nightly as per PCP  6.  Return to clinic in 4 weeks      Edrick Kins, DPM [facility]  Dr. Edrick Kins, DPM    [address], [place] [address]                Office [phone]  Fax [phone]

## 2019-10-19 ENCOUNTER — Other Ambulatory Visit: Payer: Self-pay

## 2019-10-19 ENCOUNTER — Ambulatory Visit (INDEPENDENT_AMBULATORY_CARE_PROVIDER_SITE_OTHER): Payer: Medicare Other

## 2019-10-19 DIAGNOSIS — E538 Deficiency of other specified B group vitamins: Secondary | ICD-10-CM

## 2019-10-19 MED ORDER — CYANOCOBALAMIN 1000 MCG/ML IJ SOLN
1000.0000 ug | Freq: Once | INTRAMUSCULAR | Status: AC
  Administered 2019-10-19: 1000 ug via INTRAMUSCULAR

## 2019-10-25 ENCOUNTER — Other Ambulatory Visit: Payer: Self-pay | Admitting: Family Medicine

## 2019-10-25 ENCOUNTER — Other Ambulatory Visit: Payer: Self-pay | Admitting: *Deleted

## 2019-10-25 NOTE — Telephone Encounter (Signed)
Attempted to schedule. Patient in the car and will call back.

## 2019-10-25 NOTE — Telephone Encounter (Signed)
Last office visit 06/23/2019 for acute non-recurrent sinusitis.  Last refilled 07/02/2019 for #30 with no refills.  No future appointments with PCP.

## 2019-10-25 NOTE — Telephone Encounter (Signed)
Please contact pt for future appointment . Pt overdue for 3-4 month f/u last seen 09/2018.

## 2019-10-26 MED ORDER — AMLODIPINE BESYLATE 5 MG PO TABS: 5.0000 mg | ORAL_TABLET | Freq: Every day | ORAL | 0 refills | Status: DC

## 2019-10-26 NOTE — Telephone Encounter (Signed)
Scheduled 10/14

## 2019-10-26 NOTE — Telephone Encounter (Signed)
ERx 

## 2019-10-27 NOTE — Progress Notes (Deleted)
Office Visit    Patient Name: Donald [name] Date of Encounter: 10/27/2019  Primary Care Provider:  Ria Bush, MD Primary Cardiologist:  Ida Rogue, MD Electrophysiologist:  None   Chief Complaint    Donald [name] is a 74 y.o. male with a hx of HFpEF, nonobstructive CAD, insulin-dependent diabetes, HLD, PAD, DVT and bilateral pulmonary embolism, CKD 3, OSA on CPAP, asthma bronchitis, morbid obesity, gout, GERD, anemia, chronic back pain presents today for ***   Past Medical History    Past Medical History:  Diagnosis Date  . (HFpEF) heart failure with preserved ejection fraction ([place])    a. 09/2018 Echo: EF 60-65%. PASP 64mmHg. Mild-mod LAE. Mild MR/TR.  Marland Kitchen ANEMIA-IRON DEFICIENCY 07/26/2006  . Anxiety   . ASTHMA 07/26/2006  . Asthma   . Back pain, chronic   . Bell's palsy   . CKD (chronic kidney disease), stage III ([place])   . COLONIC POLYPS, HX OF 07/26/2006  . DEPRESSION 03/14/2009  . DIABETES MELLITUS, TYPE II 07/26/2006  . [place] DISEASE, LUMBAR 10/05/2007  . DVT 12/03/2007  . DYSLIPIDEMIA 04/13/2009  . Dyspnea    when gets up and walks around and back is hurting really bad -only Shortness of breath  then  . GERD 07/26/2006  . Gout   . Heart murmur   . History of kidney stones   . HYPERTENSION 07/26/2006  . INSOMNIA 08/21/2007  . Neuropathy   . Nonobstructive CAD (coronary artery disease)    a. 2012 Cath: no high grade stenosis; b. 2018 MV: No ischemia. Attenuation artifact.   . OBSTRUCTIVE SLEEP APNEA 12/03/2007   Use C-PAP  . PERIPHERAL NEUROPATHY 07/26/2006  . Pernicious anemia 11/20/2006  . PULMONARY EMBOLISM 10/05/2007   Past Surgical History:  Procedure Laterality Date  . APPENDECTOMY  1968  . [place], 06/14/2008   [facility] Trenton [facility] at [facility] (06[facility]10)  . CARDIAC CATHETERIZATION    . CARPAL TUNNEL RELEASE Right 10/2017  . CATARACT EXTRACTION W/PHACO Left 05/30/2014   Procedure: CATARACT EXTRACTION PHACO AND INTRAOCULAR LENS PLACEMENT  (IOC);  Surgeon: Estill Cotta, MD;  Location: [facility] ORS;  Service: Ophthalmology;  Laterality: Left;  [name] 01:20 AP% 23.7 CDE 31.86  . COLONOSCOPY  10/2002   HP, SSA, TA, rpt 3 yrs ([place])  . COLONOSCOPY  01/2005   diverticulosis rpt 5 yrs ([place])   . IR NEPHROSTOMY PLACEMENT LEFT  10/19/2018  . IR URETERAL STENT LEFT NEW ACCESS W/O SEP NEPHROSTOMY CATH  10/19/2018  . LITHOTRIPSY     X 2  . NEPHROLITHOTOMY Left 10/19/2018   Procedure: NEPHROLITHOTOMY PERCUTANEOUS;  Surgeon: Franchot Gallo, MD;  Location: [facility];  Service: Urology;  Laterality: Left;  3 HRS  . PARATHYROIDECTOMY Right 04/18/2016   PARATHYROIDECTOMY for cyst [name], MD)  . SHOULDER ARTHROSCOPY W/ ROTATOR CUFF REPAIR Right   . TONSILLECTOMY      Allergies  Allergies  Allergen Reactions  . Codeine Other (See Comments)    REACTION: chest pain  . Pioglitazone Other (See Comments)    REACTION to Actos: swelling in ankles    History of Present Illness    Donald [name] is a 74 y.o. male with a hx of HFpEF, nonobstructive CAD, insulin-dependent diabetes, HLD, PAD, DVT and bilateral pulmonary embolism, CKD 3, OSA on CPAP, asthma bronchitis, morbid obesity, gout, GERD, anemia, chronic back pain .  He was last seen 10/08/18 by [name], NP.  He was seen on 7/20 for cardiac evaluation prior to  pending urology procedure.  He noted back pain and shortness of breath with exertion at that time.  His SBP was elevated 150s to 160s and bilateral lower extremity edema.  Lasix increased to 80 mg twice daily for 3 days.  Echo with LVEF 60 to 65% with elevated PASP 37 mmHg.  Seen in follow-up 10/08/2018.  He noted improvement lower extremity edema and weight loss.  His dyspnea on exertion was unchanged compared to baseline.  He was noted to be eating out and long discussion regarding lifestyle changes in the setting of HFpEF.  He had trace to 1+ bilateral lower extremity edema.  His amlodipine was increased to 5 mg  daily.  ***   EKGs/Labs/Other Studies Reviewed:   The following studies were reviewed today: ***  EKG:  EKG is *** ordered today.  The ekg ordered today demonstrates ***  Recent Labs: 10/28/2018: Pro B Natriuretic peptide (BNP) 67.0 04/20/2019: ALT 19; BUN 45; Creatinine, Ser 1.95; Hemoglobin 14.1; Platelets 180.0; Potassium 5.2; Sodium 144  Recent Lipid Panel    Component Value Date/Time   CHOL 113 04/20/2019 1026   TRIG 112.0 04/20/2019 1026   HDL 32.00 (L) 04/20/2019 1026   CHOLHDL 4 04/20/2019 1026   VLDL 22.4 04/20/2019 1026   LDLCALC 59 04/20/2019 1026   LDLDIRECT 52.2 05/25/2009 0937    Risk Assessment/Calculations:  {Does this patient have ATRIAL FIBRILLATION?[phone]}  Home Medications   No outpatient medications have been marked as taking for the 10/28/19 encounter (Appointment) with Loel Dubonnet, NP.     Review of Systems   ***   ROS All other systems reviewed and are otherwise negative except as noted above.  Physical Exam    VS:  There were no vitals taken for this visit. , BMI There is no height or weight on file to calculate BMI.  Wt Readings from Last 3 Encounters:  10/07/19 273 lb (123.8 kg)  06/18/19 278 lb (126.1 kg)  06/02/19 280 lb (127 kg)     GEN: Well nourished, well developed, in no acute distress. HEENT: normal. Neck: Supple, no JVD, carotid bruits, or masses. Cardiac: ***RRR, no murmurs, rubs, or gallops. No clubbing, cyanosis, edema.  ***Radials/DP/PT 2+ and equal bilaterally.  Respiratory:  ***Respirations regular and unlabored, clear to auscultation bilaterally. GI: Soft, nontender, nondistended. MS: No deformity or atrophy. Skin: Warm and dry, no rash. Neuro:  Strength and sensation are intact. Psych: Normal affect.  Assessment & Plan    1. HFpEF-Echo 10/01/2018 EF 60 to 65%, moderately elevated PASP 57 mmHg, LA mild to moderately dilated, mild MR.  2. HFpEF-Echo 10/01/2018 EF 60 to 65%,  3. HTN-  4. Morbid  obesity-  5. CKD 3-  6. Insulin-dependent diabetes-10/07/2019 A1c 7.2.  Follows with endocrinology.  7. HLD-04/20/2019 LDL 59.  8. Nonobstructive CAD-catheterization 2012 with no high-grade stenosis.  Lexiscan Myoview 2018 with no ischemia.  Disposition: Follow up {follow up:15908} with Dr. Rockey Situ or APP.    Signed, Loel Dubonnet, NP 10/27/2019, 1:15 PM [facility]

## 2019-10-28 ENCOUNTER — Ambulatory Visit: Payer: Medicare Other | Admitting: Family

## 2019-10-29 ENCOUNTER — Ambulatory Visit: Payer: Medicare Other | Admitting: Podiatry

## 2019-11-01 NOTE — Chronic Care Management (AMB) (Signed)
Chronic Care Management Pharmacy  Name: Donald [name]  MRN: [id number] DOB: February 14, 1945  Chief Complaint/ HPI  Donald [name],  74 y.o., male presents for their Follow-Up CCM visit with the clinical pharmacist via telephone.  PCP : Ria Bush, MD  Their chronic conditions addressed today include: hypertension, type 2 diabetes  Patient concerns: Patient states back pain is improved after last epidural. Patient has seen podiatry for arthritis in foot and most recent injection is helping with pain.   Last CCM visit 07/21, no medication changes  Office Visits:    06/23/19: Acute sinusitis - 10 day course Augmentin  06/18/19: Nasal congestion - resume allergy medications  Consult Visits:    10/15/19: Podiatry - injection to foot. Continue gabapentin nightly.   10/07/19: Endocrinology - bp is high at visit. Referred to PCP for recheck. Continue same insulins. Give 10 extra units anytime >200 mg/dL due to steroid.   09/28/19: Podiatry - degenerative joint disease tarsometatarsal. Injection into foot, methylprednisolone dose pack given. X-rays reviewed.   06/02/19: Endocrinology -  BP elevated, f/u with PCP; continue insulin dosing as prescribed, take an extra 10 units for any BG > 200 after steroid injection   Allergies  Allergen Reactions  . Codeine Other (See Comments)    REACTION: chest pain  . Pioglitazone Other (See Comments)    REACTION to Actos: swelling in ankles    Current Outpatient Medications on File Prior to Visit  Medication Sig Dispense Refill  . allopurinol (ZYLOPRIM) 300 MG tablet TAKE ONE TABLET EVERY DAY 30 tablet 10  . ALPRAZolam (XANAX) 1 MG tablet TAKE ONE TABLET AT BEDTIME IF NEEDED FORSLEEP 30 tablet 0  . amLODipine (NORVASC) 5 MG tablet Take 1 tablet (5 mg total) by mouth daily. 90 tablet 0  . aspirin EC 81 MG tablet Take 1 tablet (81 mg total) by mouth daily.    . cyanocobalamin (,VITAMIN B-12,) 1000 MCG/ML injection Inject 1,000 mcg into  the muscle every 30 (thirty) days.    . Cyanocobalamin (B-12) 1000 MCG SUBL Place 1 tablet under the tongue daily.    . fluticasone (FLONASE) 50 MCG/ACT nasal spray Place 2 sprays into both nostrils daily. 16 g 1  . furosemide (LASIX) 80 MG tablet TAKE ONE TABLET (80 MG) BY MOUTH EVERY DAY 90 tablet 3  . gabapentin (NEURONTIN) 600 MG tablet TAKE TWO TABLETS AT BEDTIME 180 tablet 3  . glucose blood (GE100 BLOOD GLUCOSE TEST) test strip 1 each by Other route 4 (four) times daily. E11.9 360 each 0  . HYDROcodone-acetaminophen (NORCO) 10-325 MG tablet Take 1 tablet by mouth every 6 (six) hours as needed for moderate pain ((typically once to twice daily if needed)).  30 tablet 0  . insulin NPH Human (HUMULIN N) 100 UNIT/ML injection Inject 2.9 mLs (290 Units total) into the skin at bedtime. 90 mL 5  . insulin regular (NOVOLIN R RELION) 100 units/mL injection 3 times a day (just before each meal), 210-150-290 units, and syringes 4/day. 210 mL 0  . losartan (COZAAR) 25 MG tablet TAKE ONE TABLET AT BEDTIME 90 tablet 3  . lovastatin (MEVACOR) 40 MG tablet TAKE TWO TABLETS BY MOUTH AT BEDTIME 180 tablet 3  . Naphazoline-Pheniramine (OPCON-A OP) Place 1 drop into both eyes 3 (three) times daily as needed (itchy eyes.).     Marland Kitchen ondansetron (ZOFRAN) 4 MG tablet Take 1 tablet (4 mg total) by mouth every 4 (four) hours as needed for nausea. 10 tablet 0  .  Potassium Citrate 15 MEQ (1620 MG) TBCR Take 1 tablet by mouth in the morning, at noon, in the evening, and at bedtime.    [name] Sodium (STOOL SOFTENER & LAXATIVE PO) Take 2 tablets by mouth at bedtime.    . colchicine (COLCRYS) 0.6 MG tablet TAKE 1 TABLET BY MOUTH EVERY HOUR UNTIL SYMPTOMS ARE BETTER, MAX OF 6TABS PER DAY. STOP IF DIARRHEA (Patient not taking: Reported on 11/05/2019) 30 tablet 4  . ferrous sulfate 324 (65 Fe) MG TBEC Take 1 tablet (325 mg total) by mouth daily. (Patient not taking: Reported on 11/05/2019) 30 tablet   .  HYDROcodone-homatropine (HYCODAN) 5-1.5 MG/5ML syrup Take 5 mLs by mouth 2 (two) times daily as needed for cough (sedation precautions). (Patient not taking: Reported on 11/05/2019) 120 mL 0  . methylPREDNISolone (MEDROL) 4 MG tablet Take as directed (Patient not taking: Reported on 11/05/2019) 21 tablet 0   No current facility-administered medications on file prior to visit.   Current Diagnosis/Assessment: Goals    . Patient Stated     04/20/2019, I will maintain and continue medications as prescribed.     . Pharmacy Care Plan     CARE PLAN ENTRY (see longitudinal plan of care for additional care plan information)  Current Barriers:  . Chronic Disease Management support, education, and care coordination needs related to Hypertension and Diabetes   Hypertension BP Readings from Last 3 Encounters:  06/23/19 (!) 151/48  06/18/19 (!) 166/59  06/02/19 (!) 144/60 .  Pharmacist Clinical Goal(s): o Over the next 3 months, patient will work with PharmD and providers to achieve BP goal <140/90 mmHg . Current regimen:   Amlodipine 5 mg - 1 tablet daily   Losartan 25 mg - 1 tablet daily at bedtime . Interventions: o Comprehensive medication review o Reviewed home blood pressure readings (at goal) o Recommend updating labs  . Patient self care activities - Over the next 3 months, patient will: o Continue to check blood pressure 2-3 times per month, document, and provide at future appointments o Ensure daily salt intake < 2300 mg/day  Diabetes Lab Results  Component Value Date/Time   HGBA1C 7.7 (H) 04/20/2019 10:26 AM   HGBA1C 7.3 (A) 02/10/2019 01:20 PM   HGBA1C 7.9 (H) 10/15/2018 02:11 PM .  Pharmacist Clinical Goal(s): o Over the next 30 days, patient will work with PharmD and providers to achieve glucose control with A1c less than 7%. Fasting blood glucose goal 80-130 mg/dL; 2 hours after meal goal of less than 180 mg/dL.  . Current regimen:   Humulin N (insulin NPH) - Inject 290  units daily at bedtime  Novolin R (insulin regular) -  Inject 170 units before lunch and 200 units before dinner (15 minutes before meals) . Interventions: o Discussed carbohydrate counting  o Recommend checking blood glucose before breakfast and 2 hours after first bite of each meal o Discussed home blood sugar readings and recent low blood sugars. o Discussed patient is appropriately correcting blood sugar with 4 oz of regular soda.  . Patient self care activities - Over the next 3 months, patient will: o Check blood sugar before breakfast and 2 hours after first bite of each meal, document, and provide at future appointment o Contact provider with any episodes of hypoglycemia  Please see past updates related to this goal by clicking on the "Past Updates" button in the selected goal          Diabetes  Sees Dr. Loanne Drilling every 2-3  months   Reports diagnosed in 8119 Complications: polyneuropathy, CAD and renal insufficiency Has had 2 steroid injections in foot for arthritis with Podiatry in the last month.   Kidney Function Lab Results  Component Value Date/Time   CREATININE 1.95 (H) 04/20/2019 10:26 AM   CREATININE 1.94 (H) 11/04/2018 03:25 PM   CREATININE 1.53 (H) 06/18/2013 11:08 AM   GFR 34.05 (L) 04/20/2019 10:26 AM   GFRNONAA 45 (L) 10/19/2018 08:25 AM   GFRNONAA 47 (L) 06/18/2013 11:08 AM   GFRAA 52 (L) 10/19/2018 08:25 AM   GFRAA 54 (L) 06/18/2013 11:08 AM   K 5.2 (H) 04/20/2019 10:26 AM   K 4.6 11/04/2018 03:25 PM     Recent Relevant Labs: Lab Results  Component Value Date/Time   HGBA1C 7.7 (H) 04/20/2019 10:26 AM   HGBA1C 7.3 (A) 02/10/2019 01:20 PM   HGBA1C 7.9 (H) 10/15/2018 02:11 PM   MICROALBUR 12.0 (H) 03/02/2015 11:42 AM   MICROALBUR 2.6 (H) 01/27/2014 11:49 AM   A1c goal < 7%  Checking BG: 2-3x times per day, time of day varies  Fasting blood glucose: average around 119 Hypoglycemia: 59  after eating a smaller dinner yesterday than usual. Has had a  few readings in the 70s as well. Patient reducing insulin with Dr. Loanne Drilling to prevent low blood sugar.   Patient is currently uncontrolled on the following medications:   Humulin N (insulin NPH) - Inject 290 units qhs (currently reducing to prevent low blood sugar)  Novolin R (insulin regular) -  Eating 2 meals/day, injects 170 units before lunch and 200 units before dinner (15 minutes before meals) - currently reducing units with Dr. Loanne Drilling  Last diabetic eye exam:  Lab Results  Component Value Date/Time   HMDIABEYEEXA Retinopathy (A) 05/21/2017 03:54 PM    Last diabetic foot exam:  Dr. Loanne Drilling checks his feet and ankles at every appt   Adherence: pt is reducing insulin dose with Dr. Loanne Drilling due to low blood sugars and eating less.   Diet: Patient reports less appetite but eating 2 main meals. Correcting low blood sugar with a small amount of regular coke. Discussed   We discussed:  Patient states doing well lately. Has had a few "low" readings in the 80s. Has had one that was 59 mg/dL last week after not eating normal amount for supper. When sugar is low his vision is blurred and he is very shaky.  Readings lately have been ~119 mg/dL. Most recent (last month) A1C per patient is 7.3% (chart shows 7.2%) per patient report. He is still trying to obtain <7%. States his appetite is less than it used to so has been able to reduce insulin doses. Checking blood sugar 4-5 times daily. Back pain and foot pain limit his exercise.   Plan: Continue current medications and close follow up with Dr. Loanne Drilling.   Hypertension   Office blood pressures are  BP Readings from Last 3 Encounters:  06/23/19 (!) 151/48  06/18/19 (!) 166/59  06/02/19 (!) 144/60   CMP Latest Ref Rng & Units 04/20/2019 11/04/2018 10/28/2018  Glucose 70 - 99 mg/dL 96 160(H) 132(H)  BUN 6 - 23 mg/dL 45(H) 25(H) 62(H)  Creatinine 0.40 - 1.50 mg/dL 1.95(H) 1.94(H) 2.51(H)  Sodium 135 - 145 mEq/L 144 143 142  Potassium 3.5 - 5.1  mEq/L 5.2(H) 4.6 4.4  Chloride 96 - 112 mEq/L 99 104 102  CO2 19 - 32 mEq/L 36(H) 31 31  Calcium 8.4 - 10.5 mg/dL 9.6 9.1 8.9  Total Protein 6.0 - 8.3 g/dL 6.5 - 6.1  Total Bilirubin 0.2 - 1.2 mg/dL 1.0 - 1.1  Alkaline Phos 39 - 117 U/L 80 - 80  AST 0 - 37 U/L 15 - 10  ALT 0 - 53 U/L 19 - 13   CrCl (adjbw): 44 mL/min   GFR (11/04/18): 34 mL/min Patient has failed these meds in the past: none  Patient checks BP at home: a couple times per month (arm cuff) Patient home BP readings are ranging: 120-125/60 mmHg  BP goal < 140/90 mmHg Patient is currently controlled on the following medications:   Amlodipine 5 mg - 1 tablet daily   Losartan 25 mg - 1 tablet daily at bedtime  Patient reports good control with home readings. BP remains 120-125/60 mmHg per patient report. Patient denies swelling. Patient continues on Potassium Citrate for kidney stone.   Plan: Continue current medications   Medication Management  Pharmacy: [facility]   Adherence: uses pillbox, refills timely   Affordability: denies concerns  CCM Follow Up: 3 months, telephone   Sherre Poot, PharmD, [facility] Clinical Pharmacist [facility] [phone] (office) [phone] (mobile)

## 2019-11-05 ENCOUNTER — Other Ambulatory Visit: Payer: Self-pay

## 2019-11-05 ENCOUNTER — Ambulatory Visit: Payer: Medicare Other

## 2019-11-05 DIAGNOSIS — N1832 Chronic kidney disease, stage 3b: Secondary | ICD-10-CM

## 2019-11-05 DIAGNOSIS — I10 Essential (primary) hypertension: Secondary | ICD-10-CM

## 2019-11-05 DIAGNOSIS — Z794 Long term (current) use of insulin: Secondary | ICD-10-CM

## 2019-11-05 NOTE — Patient Instructions (Signed)
Visit Information  Goals Addressed            This Donald [name] (see longitudinal plan of care for additional care plan information)  Current Barriers:   Chronic Disease Management support, education, and care coordination needs related to Hypertension and Diabetes   Hypertension BP Readings from Last 3 Encounters:  06/23/19 (!) 151/48  06/18/19 (!) 166/59  06/02/19 (!) 144/60   Pharmacist Clinical Goal(s): o Over the next 3 months, patient will work with PharmD and providers to achieve BP goal <140/90 mmHg  Current regimen:   Amlodipine 5 mg - 1 tablet daily   Losartan 25 mg - 1 tablet daily at bedtime  Interventions: o Comprehensive medication review o Reviewed home blood pressure readings (at goal) o Recommend updating labs   Patient self care activities - Over the next 3 months, patient will: o Continue to check blood pressure 2-3 times per month, document, and provide at future appointments o Ensure daily salt intake < 2300 mg/day  Diabetes Lab Results  Component Value Date/Time   HGBA1C 7.7 (H) 04/20/2019 10:26 AM   HGBA1C 7.3 (A) 02/10/2019 01:20 PM   HGBA1C 7.9 (H) 10/15/2018 02:11 PM   Pharmacist Clinical Goal(s): o Over the next 30 days, patient will work with PharmD and providers to achieve glucose control with A1c less than 7%. Fasting blood glucose goal 80-130 mg/dL; 2 hours after meal goal of less than 180 mg/dL.   Current regimen:   Humulin N (insulin NPH) - Inject 290 units daily at bedtime  Novolin R (insulin regular) -  Inject 170 units before lunch and 200 units before dinner (15 minutes before meals)  Interventions: o Discussed carbohydrate counting  o Recommend checking blood glucose before breakfast and 2 hours after first bite of each meal o Discussed home blood sugar readings and recent low blood sugars. o Discussed patient is appropriately correcting blood sugar with 4 oz of regular  soda.   Patient self care activities - Over the next 3 months, patient will: o Check blood sugar before breakfast and 2 hours after first bite of each meal, document, and provide at future appointment o Contact provider with any episodes of hypoglycemia  Please see past updates related to this goal by clicking on the "Past Updates" button in the selected goal        Patient verbalizes understanding of instructions provided today.   Telephone follow up appointment with pharmacy team member scheduled for: 01/21/2020 @ 10 am   Sherre Poot, PharmD, [facility] Clinical Pharmacist [facility] [phone] (office) [phone] (mobile)

## 2019-11-06 NOTE — Progress Notes (Signed)
I have collaborated with the care management provider regarding care management and care coordination activities outlined in this encounter and have reviewed this encounter including documentation in the note and care plan. I am certifying that I agree with the content of this note and encounter as supervising physician.  

## 2019-11-26 DIAGNOSIS — N1831 Chronic kidney disease, stage 3a: Secondary | ICD-10-CM | POA: Diagnosis not present

## 2019-11-30 DIAGNOSIS — D2262 Melanocytic nevi of left upper limb, including shoulder: Secondary | ICD-10-CM | POA: Diagnosis not present

## 2019-11-30 DIAGNOSIS — D485 Neoplasm of uncertain behavior of skin: Secondary | ICD-10-CM | POA: Diagnosis not present

## 2019-11-30 DIAGNOSIS — D225 Melanocytic nevi of trunk: Secondary | ICD-10-CM | POA: Diagnosis not present

## 2019-11-30 DIAGNOSIS — D2261 Melanocytic nevi of right upper limb, including shoulder: Secondary | ICD-10-CM | POA: Diagnosis not present

## 2019-11-30 DIAGNOSIS — L820 Inflamed seborrheic keratosis: Secondary | ICD-10-CM | POA: Diagnosis not present

## 2019-11-30 DIAGNOSIS — L590 Erythema ab igne [dermatitis ab igne]: Secondary | ICD-10-CM | POA: Diagnosis not present

## 2019-11-30 DIAGNOSIS — L570 Actinic keratosis: Secondary | ICD-10-CM | POA: Diagnosis not present

## 2019-11-30 DIAGNOSIS — X32XXXA Exposure to sunlight, initial encounter: Secondary | ICD-10-CM | POA: Diagnosis not present

## 2019-11-30 DIAGNOSIS — L538 Other specified erythematous conditions: Secondary | ICD-10-CM | POA: Diagnosis not present

## 2019-12-02 DIAGNOSIS — Z6841 Body Mass Index (BMI) 40.0 and over, adult: Secondary | ICD-10-CM | POA: Diagnosis not present

## 2019-12-02 DIAGNOSIS — N200 Calculus of kidney: Secondary | ICD-10-CM | POA: Diagnosis not present

## 2019-12-02 DIAGNOSIS — G4733 Obstructive sleep apnea (adult) (pediatric): Secondary | ICD-10-CM | POA: Diagnosis not present

## 2019-12-02 DIAGNOSIS — Z9989 Dependence on other enabling machines and devices: Secondary | ICD-10-CM | POA: Diagnosis not present

## 2019-12-02 DIAGNOSIS — N179 Acute kidney failure, unspecified: Secondary | ICD-10-CM | POA: Diagnosis not present

## 2019-12-02 DIAGNOSIS — D631 Anemia in chronic kidney disease: Secondary | ICD-10-CM | POA: Diagnosis not present

## 2019-12-02 DIAGNOSIS — I129 Hypertensive chronic kidney disease with stage 1 through stage 4 chronic kidney disease, or unspecified chronic kidney disease: Secondary | ICD-10-CM | POA: Diagnosis not present

## 2019-12-02 DIAGNOSIS — N2581 Secondary hyperparathyroidism of renal origin: Secondary | ICD-10-CM | POA: Diagnosis not present

## 2019-12-02 DIAGNOSIS — N1831 Chronic kidney disease, stage 3a: Secondary | ICD-10-CM | POA: Diagnosis not present

## 2019-12-02 DIAGNOSIS — E1122 Type 2 diabetes mellitus with diabetic chronic kidney disease: Secondary | ICD-10-CM | POA: Diagnosis not present

## 2019-12-02 DIAGNOSIS — M109 Gout, unspecified: Secondary | ICD-10-CM | POA: Diagnosis not present

## 2019-12-02 DIAGNOSIS — D351 Benign neoplasm of parathyroid gland: Secondary | ICD-10-CM | POA: Diagnosis not present

## 2019-12-14 DIAGNOSIS — M792 Neuralgia and neuritis, unspecified: Secondary | ICD-10-CM | POA: Diagnosis not present

## 2019-12-15 ENCOUNTER — Other Ambulatory Visit: Payer: Self-pay | Admitting: Family Medicine

## 2019-12-15 NOTE — Telephone Encounter (Signed)
ERx 

## 2019-12-15 NOTE — Telephone Encounter (Signed)
Name of Medication: Alprazolam Name of Pharmacy: [facility] or Written Date and Quantity: 10/26/19, #30 Last Office Visit and Type: 04/23/19, AWV prt 2 Next Office Visit and Type: none Last Controlled Substance Agreement Date: none Last UDS: none

## 2019-12-28 ENCOUNTER — Other Ambulatory Visit: Payer: Self-pay | Admitting: Family Medicine

## 2019-12-30 ENCOUNTER — Other Ambulatory Visit: Payer: Self-pay

## 2019-12-30 ENCOUNTER — Ambulatory Visit (INDEPENDENT_AMBULATORY_CARE_PROVIDER_SITE_OTHER): Payer: Medicare Other | Admitting: Podiatry

## 2019-12-30 ENCOUNTER — Encounter: Payer: Self-pay | Admitting: Podiatry

## 2019-12-30 DIAGNOSIS — M778 Other enthesopathies, not elsewhere classified: Secondary | ICD-10-CM

## 2019-12-30 NOTE — Progress Notes (Signed)
   HPI: 74 y.o. male presenting today for follow-up evaluation of left lateral foot pain.  Patient states the injection of the Dr. Amalia Hailey gave tend to help tremendously.  He would like to do another injection as he is about 80% better.  He denies any other acute complaints.  E.  Past Medical History:  Diagnosis Date  . (HFpEF) heart failure with preserved ejection fraction ([place])    a. 09/2018 Echo: EF 60-65%. PASP 89mmHg. Mild-mod LAE. Mild MR/TR.  Marland Kitchen ANEMIA-IRON DEFICIENCY 07/26/2006  . Anxiety   . ASTHMA 07/26/2006  . Asthma   . Back pain, chronic   . Bell's palsy   . CKD (chronic kidney disease), stage III ([place])   . COLONIC POLYPS, HX OF 07/26/2006  . DEPRESSION 03/14/2009  . DIABETES MELLITUS, TYPE II 07/26/2006  . [place] DISEASE, LUMBAR 10/05/2007  . DVT 12/03/2007  . DYSLIPIDEMIA 04/13/2009  . Dyspnea    when gets up and walks around and back is hurting really bad -only Shortness of breath  then  . GERD 07/26/2006  . Gout   . Heart murmur   . History of kidney stones   . HYPERTENSION 07/26/2006  . INSOMNIA 08/21/2007  . Neuropathy   . Nonobstructive CAD (coronary artery disease)    a. 2012 Cath: no high grade stenosis; b. 2018 MV: No ischemia. Attenuation artifact.   . OBSTRUCTIVE SLEEP APNEA 12/03/2007   Use C-PAP  . PERIPHERAL NEUROPATHY 07/26/2006  . Pernicious anemia 11/20/2006  . PULMONARY EMBOLISM 10/05/2007     Physical Exam: General: The patient is alert and oriented x3 in no acute distress.  Dermatology: Skin is warm, dry and supple bilateral lower extremities. Negative for open lesions or macerations.  Vascular: Palpable pedal pulses bilaterally. No edema or erythema noted. Capillary refill within normal limits.  Neurological: Epicritic and protective threshold grossly intact bilaterally.   Musculoskeletal Exam: Range of motion within normal limits to all pedal and ankle joints bilateral. Muscle strength 5/5 in all groups bilateral.  There is some pain on palpation noted to  the lateral aspect of the left foot at the tarsometatarsal joint, specifically the fifth metatarsal cuboid joint.  This correlates with the degenerative changes noted on radiographic exam.  The pain does not correlate to the area of the suspected aneurysmal bone cyst.  Assessment: 1.  DJD/capsulitis tarsometatarsal joint lateral left foot 2.  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 Patient evaluated.  2.  Second injection of Kenalog 10 injection to the lateral aspect of the TMT joint left foot  3.  Patient will return to see Dr. Amalia Hailey back again in 4 weeks for further management.

## 2020-01-04 ENCOUNTER — Telehealth: Payer: Self-pay

## 2020-01-04 NOTE — Chronic Care Management (AMB) (Addendum)
    Chronic Care Management Pharmacy Assistant   Name: Donald [name]  MRN: [id number] DOB: [birth year]  Reason for Encounter: Disease State  Attempted contact with patient on 3 different occasions for hypertension and diabetes disease state call. Patient did not answer nor return calls.  Reviewed chart and adherence measures. There is no insurance data available for hypertension or diabetes medication adherence.    PCP : Ria Bush, MD  Follow-Up:  Pharmacist Review   Debbora Dus, CPP notified  Margaretmary Dys, [place] Assistant [phone]    01/04/2020 10:05 AM Phone ([address] [name], [name] (Self) [phone] (M)   By Susa Simmonds    01/10/2020 09:44 AM Phone ([address]) [name], [name] (Self) [phone] (M)   Left Message    By Susa Simmonds    01/12/2020 09:55 AM Phone (Outgoing) [name], [name] (Self) [phone] (M)   Left Message    By Waynetta Sandy, Lanell Matar

## 2020-01-11 ENCOUNTER — Encounter: Payer: Self-pay | Admitting: Podiatry

## 2020-01-11 ENCOUNTER — Ambulatory Visit (INDEPENDENT_AMBULATORY_CARE_PROVIDER_SITE_OTHER): Payer: Medicare Other | Admitting: Podiatry

## 2020-01-11 ENCOUNTER — Other Ambulatory Visit: Payer: Self-pay

## 2020-01-11 DIAGNOSIS — M778 Other enthesopathies, not elsewhere classified: Secondary | ICD-10-CM

## 2020-01-11 MED ORDER — METHYLPREDNISOLONE 4 MG PO TABS: ORAL_TABLET | ORAL | 0 refills | Status: DC

## 2020-01-11 NOTE — Progress Notes (Signed)
   HPI: 74 y.o. male presenting today for follow-up evaluation of left lateral foot pain.  Patient states the pain came back again.  The injection that I gave last time did not not help more than 2 weeks.  He states it started to hurt again.  He would like to do another injection.  He would also like to discuss Medrol Dosepak as well.     Past Medical History:  Diagnosis Date  . (HFpEF) heart failure with preserved ejection fraction ([place])    a. 09/2018 Echo: EF 60-65%. PASP 42mmHg. Mild-mod LAE. Mild MR/TR.  Marland Kitchen ANEMIA-IRON DEFICIENCY 07/26/2006  . Anxiety   . ASTHMA 07/26/2006  . Asthma   . Back pain, chronic   . Bell's palsy   . CKD (chronic kidney disease), stage III ([place])   . COLONIC POLYPS, HX OF 07/26/2006  . DEPRESSION 03/14/2009  . DIABETES MELLITUS, TYPE II 07/26/2006  . [place] DISEASE, LUMBAR 10/05/2007  . DVT 12/03/2007  . DYSLIPIDEMIA 04/13/2009  . Dyspnea    when gets up and walks around and back is hurting really bad -only Shortness of breath  then  . GERD 07/26/2006  . Gout   . Heart murmur   . History of kidney stones   . HYPERTENSION 07/26/2006  . INSOMNIA 08/21/2007  . Neuropathy   . Nonobstructive CAD (coronary artery disease)    a. 2012 Cath: no high grade stenosis; b. 2018 MV: No ischemia. Attenuation artifact.   . OBSTRUCTIVE SLEEP APNEA 12/03/2007   Use C-PAP  . PERIPHERAL NEUROPATHY 07/26/2006  . Pernicious anemia 11/20/2006  . PULMONARY EMBOLISM 10/05/2007     Physical Exam: General: The patient is alert and oriented x3 in no acute distress.  Dermatology: Skin is warm, dry and supple bilateral lower extremities. Negative for open lesions or macerations.  Vascular: Palpable pedal pulses bilaterally. No edema or erythema noted. Capillary refill within normal limits.  Neurological: Epicritic and protective threshold grossly intact bilaterally.   Musculoskeletal Exam: Range of motion within normal limits to all pedal and ankle joints bilateral. Muscle strength 5/5 in  all groups bilateral.  There is some pain on palpation noted to the lateral aspect of the left foot at the tarsometatarsal joint, specifically the fifth metatarsal cuboid joint.  This correlates with the degenerative changes noted on radiographic exam.  The pain does not correlate to the area of the suspected aneurysmal bone cyst.  Assessment: 1.  DJD/capsulitis tarsometatarsal joint lateral left foot 2.  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 Patient evaluated.  2.  Second injection of Kenalog 10 injection to the lateral aspect of the TMT joint left foot  3.  Patient will return to see Dr. Amalia Hailey back again in 4 weeks for further management. 4.  Medrol Dosepak was sent to the pharmacy.

## 2020-01-12 ENCOUNTER — Ambulatory Visit: Payer: Medicare Other | Admitting: Podiatry

## 2020-01-13 ENCOUNTER — Other Ambulatory Visit: Payer: Self-pay | Admitting: Family Medicine

## 2020-01-13 NOTE — Telephone Encounter (Signed)
Name of Medication: Alprazolam Name of Pharmacy: [facility] or Written Date and Quantity: 12/15/19, #30 Last Office Visit and Type: 04/23/19, AWV Next Office Visit and Type: none Last Controlled Substance Agreement Date: none Last UDS: none

## 2020-01-14 NOTE — Telephone Encounter (Signed)
ERx 

## 2020-01-15 DIAGNOSIS — U071 COVID-19: Secondary | ICD-10-CM

## 2020-01-15 HISTORY — DX: COVID-19: U07.1

## 2020-01-20 NOTE — Chronic Care Management (AMB) (Deleted)
Chronic Care Management Pharmacy  Name: Donald [name]  MRN: [id number] DOB: 1945/07/19  Chief Complaint/ HPI  Donald [name],  75 y.o., male presents for their Follow-Up CCM visit with the clinical pharmacist via telephone.  PCP : Donald Bush, MD  Their chronic conditions addressed today include: hypertension, type 2 diabetes  Patient concerns: denies concerns  Last CCM visit 11/05/19 with Donald [name]  Office Visits:   06/23/19: Acute sinusitis - 10 day course Augmentin  06/18/19: Nasal congestion - resume allergy medications  Consult Visits:   01/11/20: Podiatry - Kenalog injection to foot. Medrol dose pack to pharmacy  12/30/19: Podiatry - Kenalog injection to foot  10/15/19: Podiatry - Kenalog injection to foot. Continue gabapentin nightly.   10/07/19: Endocrinology - bp is high at visit. Referred to PCP for recheck. Continue same insulins. Give 10 extra units anytime >200 mg/dL due to steroid.   09/28/19: Podiatry - degenerative joint disease tarsometatarsal. Injection into foot, methylprednisolone dose pack given. X-rays reviewed.  06/02/19: Endocrinology -  BP elevated, f/u with PCP; continue insulin dosing as prescribed, take an extra 10 units for any BG > 200 after steroid injection   Allergies  Allergen Reactions  . Codeine Other (See Comments)    REACTION: chest pain  . Pioglitazone Other (See Comments)    REACTION to Actos: swelling in ankles    Current Outpatient Medications on File Prior to Visit  Medication Sig Dispense Refill  . allopurinol (ZYLOPRIM) 300 MG tablet TAKE ONE TABLET EVERY DAY 30 tablet 10  . ALPRAZolam (XANAX) 1 MG tablet TAKE ONE TABLET AT BEDTIME IF NEEDED FORSLEEP 30 tablet 0  . amLODipine (NORVASC) 5 MG tablet Take 1 tablet (5 mg total) by mouth daily. 90 tablet 0  . aspirin EC 81 MG tablet Take 1 tablet (81 mg total) by mouth daily.    . colchicine (COLCRYS) 0.6 MG tablet TAKE 1 TABLET BY MOUTH EVERY HOUR UNTIL SYMPTOMS ARE  BETTER, MAX OF 6TABS PER DAY. STOP IF DIARRHEA (Patient not taking: Reported on 11/05/2019) 30 tablet 4  . cyanocobalamin (,VITAMIN B-12,) 1000 MCG/ML injection Inject 1,000 mcg into the muscle every 30 (thirty) days.    . Cyanocobalamin (B-12) 1000 MCG SUBL Place 1 tablet under the tongue daily.    . ferrous sulfate 324 (65 Fe) MG TBEC Take 1 tablet (325 mg total) by mouth daily. (Patient not taking: Reported on 11/05/2019) 30 tablet   . fluticasone (FLONASE) 50 MCG/ACT nasal spray Place 2 sprays into both nostrils daily. 16 g 1  . furosemide (LASIX) 80 MG tablet TAKE ONE TABLET (80 MG) BY MOUTH EVERY DAY 90 tablet 1  . gabapentin (NEURONTIN) 600 MG tablet TAKE TWO TABLETS AT BEDTIME 180 tablet 3  . glucose blood (GE100 BLOOD GLUCOSE TEST) test strip 1 each by Other route 4 (four) times daily. E11.9 360 each 0  . HYDROcodone-acetaminophen (NORCO) 10-325 MG tablet Take 1 tablet by mouth every 6 (six) hours as needed for moderate pain ((typically once to twice daily if needed)).  30 tablet 0  . HYDROcodone-homatropine (HYCODAN) 5-1.5 MG/5ML syrup Take 5 mLs by mouth 2 (two) times daily as needed for cough (sedation precautions). (Patient not taking: Reported on 11/05/2019) 120 mL 0  . insulin NPH Human (HUMULIN N) 100 UNIT/ML injection Inject 2.9 mLs (290 Units total) into the skin at bedtime. 90 mL 5  . insulin regular (NOVOLIN R RELION) 100 units/mL injection 3 times a day (just before each meal), 210-150-290 units,  and syringes 4/day. 210 mL 0  . losartan (COZAAR) 25 MG tablet TAKE ONE TABLET AT BEDTIME 90 tablet 3  . lovastatin (MEVACOR) 40 MG tablet TAKE TWO TABLETS BY MOUTH AT BEDTIME 180 tablet 3  . methylPREDNISolone (MEDROL) 4 MG tablet Take as directed (Patient not taking: Reported on 11/05/2019) 21 tablet 0  . methylPREDNISolone (MEDROL) 4 MG tablet Take as directed 21 tablet 0  . Naphazoline-Pheniramine (OPCON-A OP) Place 1 drop into both eyes 3 (three) times daily as needed (itchy eyes.).      Marland Kitchen ondansetron (ZOFRAN) 4 MG tablet Take 1 tablet (4 mg total) by mouth every 4 (four) hours as needed for nausea. 10 tablet 0  . Potassium Citrate 15 MEQ (1620 MG) TBCR Take 1 tablet by mouth in the morning, at noon, in the evening, and at bedtime.    [name] Sodium (STOOL SOFTENER & LAXATIVE PO) Take 2 tablets by mouth at bedtime.     No current facility-administered medications on file prior to visit.   Current Diagnosis/Assessment: Goals    . Patient Stated     04/20/2019, I will maintain and continue medications as prescribed.     . Pharmacy Care Plan     CARE PLAN ENTRY (see longitudinal plan of care for additional care plan information)  Current Barriers:  . Chronic Disease Management support, education, and care coordination needs related to Hypertension and Diabetes   Hypertension BP Readings from Last 3 Encounters:  06/23/19 (!) 151/48  06/18/19 (!) 166/59  06/02/19 (!) 144/60 .  Pharmacist Clinical Goal(s): o Over the next 3 months, patient will work with PharmD and providers to achieve BP goal <140/90 mmHg . Current regimen:   Amlodipine 5 mg - 1 tablet daily   Losartan 25 mg - 1 tablet daily at bedtime . Interventions: o Comprehensive medication review o Reviewed home blood pressure readings (at goal) o Recommend updating labs  . Patient self care activities - Over the next 3 months, patient will: o Continue to check blood pressure 2-3 times per month, document, and provide at future appointments o Ensure daily salt intake < 2300 mg/day  Diabetes Lab Results  Component Value Date/Time   HGBA1C 7.7 (H) 04/20/2019 10:26 AM   HGBA1C 7.3 (A) 02/10/2019 01:20 PM   HGBA1C 7.9 (H) 10/15/2018 02:11 PM .  Pharmacist Clinical Goal(s): o Over the next 30 days, patient will work with PharmD and providers to achieve glucose control with A1c less than 7%. Fasting blood glucose goal 80-130 mg/dL; 2 hours after meal goal of less than 180 mg/dL.  . Current  regimen:   Humulin N (insulin NPH) - Inject 290 units daily at bedtime  Novolin R (insulin regular) -  Inject 170 units before lunch and 200 units before dinner (15 minutes before meals) . Interventions: o Discussed carbohydrate counting  o Recommend checking blood glucose before breakfast and 2 hours after first bite of each meal o Discussed home blood sugar readings and recent low blood sugars. o Discussed patient is appropriately correcting blood sugar with 4 oz of regular soda.  . Patient self care activities - Over the next 3 months, patient will: o Check blood sugar before breakfast and 2 hours after first bite of each meal, document, and provide at future appointment o Contact provider with any episodes of hypoglycemia  Please see past updates related to this goal by clicking on the "Past Updates" button in the selected goal  Diabetes   Sees Dr. Loanne Drilling every 2-3 months   Reports diagnosed in 8299 Complications: polyneuropathy, CAD and renal insufficiency Has had 2 steroid injections in foot for arthritis with Podiatry in the last month.   Lab Results  Component Value Date   CREATININE 1.95 (H) 04/20/2019   BUN 45 (H) 04/20/2019   GFR 34.05 (L) 04/20/2019   GFRNONAA 45 (L) 10/19/2018   GFRAA 52 (L) 10/19/2018   NA 144 04/20/2019   K 5.2 (H) 04/20/2019   CALCIUM 9.6 04/20/2019   CO2 36 (H) 04/20/2019    Recent Relevant Labs: Lab Results  Component Value Date/Time   HGBA1C 7.2 (A) 10/07/2019 11:26 AM   HGBA1C 7.7 (H) 04/20/2019 10:26 AM   HGBA1C 7.3 (A) 02/10/2019 01:20 PM   HGBA1C 7.9 (H) 10/15/2018 02:11 PM   MICROALBUR 12.0 (H) 03/02/2015 11:42 AM   MICROALBUR 2.6 (H) 01/27/2014 11:49 AM    A1c goal < 7%  Checking BG: 2-3x times per day, time of day varies  Fasting blood glucose: average around 119 Hypoglycemia: 59  after eating a smaller dinner yesterday than usual. Has had a few readings in the 70s as well. Patient reducing insulin with Dr.  Loanne Drilling to prevent low blood sugar.   Patient is currently uncontrolled on the following medications:   Humulin N (insulin NPH) - Inject 290 units qhs (currently reducing to prevent low blood sugar)  Novolin R (insulin regular) -  Eating 2 meals/day, injects 170 units before lunch and 200 units before dinner (15 minutes before meals) - currently reducing units with Dr. Loanne Drilling  Last diabetic eye exam:       Lab Results  Component Value Date/Time   HMDIABEYEEXA Retinopathy (A) 05/21/2017 03:54 PM    Last diabetic foot exam:  Dr. Loanne Drilling checks his feet and ankles at every appt   Adherence: pt is reducing insulin dose with Dr. Loanne Drilling due to low blood sugars and eating less.   Diet: Patient reports less appetite but eating 2 main meals. Correcting low blood sugar with a small amount of regular coke. Discussed   We discussed:  Patient states doing well lately. Has had a few "low" readings in the 80s. Has had one that was 59 mg/dL last week after not eating normal amount for supper. When sugar is low his vision is blurred and he is very shaky.  Readings lately have been ~119 mg/dL. Most recent (last month) A1C per patient is 7.3% (chart shows 7.2%) per patient report. He is still trying to obtain <7%. States his appetite is less than it used to so has been able to reduce insulin doses. Checking blood sugar 4-5 times daily. Back pain and foot pain limit his exercise.   Update 01/21/20:   Plan: Continue current medications and close follow up with Dr. Loanne Drilling.  Hypertension   CMP Latest Ref Rng & Units 04/20/2019 11/04/2018 10/28/2018  Glucose 70 - 99 mg/dL 96 160(H) 132(H)  BUN 6 - 23 mg/dL 45(H) 25(H) 62(H)  Creatinine 0.40 - 1.50 mg/dL 1.95(H) 1.94(H) 2.51(H)  Sodium 135 - 145 mEq/L 144 143 142  Potassium 3.5 - 5.1 mEq/L 5.2(H) 4.6 4.4  Chloride 96 - 112 mEq/L 99 104 102  CO2 19 - 32 mEq/L 36(H) 31 31  Calcium 8.4 - 10.5 mg/dL 9.6 9.1 8.9  Total Protein 6.0 - 8.3 g/dL 6.5 - 6.1   Total Bilirubin 0.2 - 1.2 mg/dL 1.0 - 1.1  Alkaline Phos 39 - 117 U/L 80 - 80  AST 0 - 37 U/L 15 - 10  ALT 0 - 53 U/L 19 - 13   Office blood pressures are: BP Readings from Last 3 Encounters:  10/07/19 (!) 158/60  06/23/19 (!) 151/48  06/18/19 (!) 166/59   CrCl (adjbw): 44 mL/min   GFR (11/04/18): 34 mL/min Patient has failed these meds in the past: none  Patient checks BP at home: a couple times per month (arm cuff) Patient home BP readings are ranging: 130/60-70s mmHg   BP goal < 140/90 mmHg Patient is currently controlled on the following medications:   Amlodipine 5 mg - 1 tablet daily   Losartan 25 mg - 1 tablet daily at bedtime  Restarted losartan 12/20, recommend updated BMP due to elevated potassium 04/2019 Patient reports good control with home readings. BP remains 120-125/60 mmHg per patient report. Patient denies swelling. Patient continues on Potassium Citrate for kidney stone. Dose was decreased to 37mq to twice daily (from TID) following last lab.  Update 01/21/20:   Plan: Continue current medications   Hyperlipidemia   LDL goal < 70  Last lipids Lab Results  Component Value Date   CHOL 113 04/20/2019   HDL 32.00 (L) 04/20/2019   LDLCALC 59 04/20/2019   LDLDIRECT 52.2 05/25/2009   TRIG 112.0 04/20/2019   CHOLHDL 4 04/20/2019   Hepatic Function Latest Ref Rng & Units 04/20/2019 11/04/2018 10/28/2018  Total Protein 6.0 - 8.3 g/dL 6.5 - 6.1  Albumin 3.5 - 5.2 g/dL 4.3 4.0 3.6  AST 0 - 37 U/L 15 - 10  ALT 0 - 53 U/L 19 - 13  Alk Phosphatase 39 - 117 U/L 80 - 80  Total Bilirubin 0.2 - 1.2 mg/dL 1.0 - 1.1  Bilirubin, Direct 0.0 - 0.3 mg/dL - - -     The ASCVD Risk score (GMatteson, et al., 2013) failed to calculate for the following reasons:   The valid total cholesterol range is 130 to 320 mg/dL   Patient has failed these meds in past: *** Patient is currently controlled on the following medications:  . Lovastatin 40 mg - 2 tablets daily  We  discussed:  {CHL [facility] discussion:[phone]}  Plan  Continue {CHL [facility] PIYJGZ:[id number]}  Medication Management   [facility] [address]Phone: [phone]: [phone] [facility] [address]: [phone]: [phone] Uses pill box? Yes Pt endorses 100% compliance  We discussed: Discussed benefits of medication synchronization, packaging and delivery as well as enhanced pharmacist oversight with [facility].  Plan  Continue current medication management strategy  CCM Follow Up: 3 months, telephone   MDebbora Dus PharmD Clinical Pharmacist [phone]

## 2020-01-21 ENCOUNTER — Telehealth: Payer: Self-pay

## 2020-01-21 ENCOUNTER — Telehealth: Payer: Medicare Other

## 2020-01-21 NOTE — Telephone Encounter (Signed)
  Chronic Care Management   Outreach Note  01/21/2020 Name: JOHNSON ARIZOLA MRN: [id number] DOB: 15-Sep-1945  Referred by: Ria Bush, MD  Attempted to reach patient by telephone for CCM follow up to review diabetes, hypertension, and hyperlipidemia. Left voicemail with pharmacist contact information.   Follow Up Plan: CMA will reach out for rescheduling   Debbora Dus, PharmD Clinical Pharmacist [facility] [phone]

## 2020-01-26 ENCOUNTER — Ambulatory Visit: Payer: Medicare Other | Admitting: Cardiovascular Disease

## 2020-01-27 ENCOUNTER — Ambulatory Visit: Payer: Medicare Other | Admitting: Podiatry

## 2020-01-27 ENCOUNTER — Other Ambulatory Visit: Payer: Self-pay | Admitting: Family Medicine

## 2020-02-01 ENCOUNTER — Telehealth: Payer: Self-pay

## 2020-02-01 NOTE — Chronic Care Management (AMB) (Addendum)
Chronic Care Management Pharmacy Assistant   Name: Donald [name]  MRN: [id number] DOB: 09/01/1945  Reason for Encounter: Disease State Diabetes / Hypertension   Patient Questions:  1.  Have you seen any other providers since your last visit? Yes 01/10/21- Dr. Boneta Lucks- Podiatry  2.  Any changes in your medicines or health? Yes 01/11/20 Dr. Posey Pronto started patient on short course of methylprednisolone 4 mg.   PCP : Ria Bush, MD  Allergies:   Allergies  Allergen Reactions   Codeine Other (See Comments)    REACTION: chest pain   Pioglitazone Other (See Comments)    REACTION to Actos: swelling in ankles    Medications: Outpatient Encounter Medications as of 02/01/2020  Medication Sig   allopurinol (ZYLOPRIM) 300 MG tablet TAKE ONE TABLET EVERY DAY   ALPRAZolam (XANAX) 1 MG tablet TAKE ONE TABLET AT BEDTIME IF NEEDED FORSLEEP   amLODipine (NORVASC) 5 MG tablet TAKE 1 TABLET BY MOUTH DAILY   aspirin EC 81 MG tablet Take 1 tablet (81 mg total) by mouth daily.   colchicine (COLCRYS) 0.6 MG tablet TAKE 1 TABLET BY MOUTH EVERY HOUR UNTIL SYMPTOMS ARE BETTER, MAX OF 6TABS PER DAY. STOP IF DIARRHEA (Patient not taking: Reported on 11/05/2019)   cyanocobalamin (,VITAMIN B-12,) 1000 MCG/ML injection Inject 1,000 mcg into the muscle every 30 (thirty) days.   Cyanocobalamin (B-12) 1000 MCG SUBL Place 1 tablet under the tongue daily.   ferrous sulfate 324 (65 Fe) MG TBEC Take 1 tablet (325 mg total) by mouth daily. (Patient not taking: Reported on 11/05/2019)   fluticasone (FLONASE) 50 MCG/ACT nasal spray Place 2 sprays into both nostrils daily.   furosemide (LASIX) 80 MG tablet TAKE ONE TABLET (80 MG) BY MOUTH EVERY DAY   gabapentin (NEURONTIN) 600 MG tablet TAKE TWO TABLETS AT BEDTIME   glucose blood (GE100 BLOOD GLUCOSE TEST) test strip 1 each by Other route 4 (four) times daily. E11.9   HYDROcodone-acetaminophen (NORCO) 10-325 MG tablet Take 1 tablet by mouth every 6  (six) hours as needed for moderate pain ((typically once to twice daily if needed)).    HYDROcodone-homatropine (HYCODAN) 5-1.5 MG/5ML syrup Take 5 mLs by mouth 2 (two) times daily as needed for cough (sedation precautions). (Patient not taking: Reported on 11/05/2019)   insulin NPH Human (HUMULIN N) 100 UNIT/ML injection Inject 2.9 mLs (290 Units total) into the skin at bedtime.   insulin regular (NOVOLIN R RELION) 100 units/mL injection 3 times a day (just before each meal), 210-150-290 units, and syringes 4/day.   losartan (COZAAR) 25 MG tablet TAKE ONE TABLET AT BEDTIME   lovastatin (MEVACOR) 40 MG tablet TAKE TWO TABLETS BY MOUTH AT BEDTIME   methylPREDNISolone (MEDROL) 4 MG tablet Take as directed (Patient not taking: Reported on 11/05/2019)   methylPREDNISolone (MEDROL) 4 MG tablet Take as directed   Naphazoline-Pheniramine (OPCON-A OP) Place 1 drop into both eyes 3 (three) times daily as needed (itchy eyes.).    ondansetron (ZOFRAN) 4 MG tablet Take 1 tablet (4 mg total) by mouth every 4 (four) hours as needed for nausea.   Potassium Citrate 15 MEQ (1620 MG) TBCR Take 1 tablet by mouth in the morning, at noon, in the evening, and at bedtime.   Sennosides-Docusate Sodium (STOOL SOFTENER & LAXATIVE PO) Take 2 tablets by mouth at bedtime.   No facility-administered encounter medications on file as of 02/01/2020.    Current Diagnosis: Patient Active Problem List   Diagnosis Date Noted  Cervical radiculopathy 05/19/2019   Acute on chronic renal insufficiency 11/04/2018   Heart failure with preserved ejection fraction ([place]) 10/28/2018   Pre-op evaluation 08/12/2018   Other fatigue 08/10/2018   Carotid stenosis, asymptomatic, bilateral 03/29/2018   Medicare annual wellness visit, subsequent 03/20/2018   Advanced care planning/counseling discussion 03/20/2018   Vitamin D deficiency 03/20/2018   Vitamin B12 deficiency 93/57/0177   Systolic murmur 93/90/3009   Type 2 diabetes mellitus  with diabetic chronic kidney disease ([place]) 12/19/2017   Constipation 12/19/2017   Chronic gout 12/19/2017   Diabetic peripheral neuropathy ([place]) 10/06/2017   Carpal tunnel syndrome of left wrist 09/19/2017   PAD (peripheral artery disease) ([place]) 05/07/2017   Upper airway cough syndrome 08/21/2016   S/P parathyroidectomy ([place]) 04/18/2016   Uncontrolled type 2 diabetes mellitus with peripheral neuropathy ([place]) 03/12/2016   ECG abnormal 02/28/2016   Loss of hearing 05/30/2015   CKD stage 3 due to type 2 diabetes mellitus ([place]) 01/27/2014   Obesity, morbid, BMI 40.0-49.9 ([place]) 03/08/2013   Impotence of organic origin 03/11/2011   CAD (coronary artery disease) 11/19/2010   Recurrent nephrolithiasis 03/26/2010   Acute sinusitis 02/27/2010   HEMATURIA UNSPECIFIED 02/27/2010   Dyslipidemia 04/13/2009   ALLERGIC RHINITIS 04/13/2009   DEPRESSION 03/14/2009   PROTEINURIA, MILD 09/16/2008   History of deep vein thrombosis (DVT) of lower extremity 12/03/2007   OSA (obstructive sleep apnea) 12/03/2007   History of pulmonary embolus (PE) 10/05/2007   DISC DISEASE, LUMBAR 10/05/2007   Chronic insomnia 08/21/2007   Pernicious anemia 11/20/2006   Iron deficiency anemia 07/26/2006   Essential hypertension 07/26/2006   Asthmatic bronchitis 07/26/2006   GERD 07/26/2006   COLONIC POLYPS, HX OF 07/26/2006   Reviewed chart prior to disease state call. Spoke with patient regarding BP  Recent Office Vitals: BP Readings from Last 3 Encounters:  10/07/19 (!) 158/60  06/23/19 (!) 151/48  06/18/19 (!) 166/59   Pulse Readings from Last 3 Encounters:  10/07/19 (!) 59  06/23/19 (!) 50  06/18/19 62    Wt Readings from Last 3 Encounters:  10/07/19 273 lb (123.8 kg)  06/18/19 278 lb (126.1 kg)  06/02/19 280 lb (127 kg)     Kidney Function Lab Results  Component Value Date/Time   CREATININE 1.95 (H) 04/20/2019 10:26 AM   CREATININE 1.94 (H) 11/04/2018 03:25 PM   CREATININE 1.53 (H) 06/18/2013  11:08 AM   GFR 34.05 (L) 04/20/2019 10:26 AM   GFRNONAA 45 (L) 10/19/2018 08:25 AM   GFRNONAA 47 (L) 06/18/2013 11:08 AM   GFRAA 52 (L) 10/19/2018 08:25 AM   GFRAA 54 (L) 06/18/2013 11:08 AM    BMP Latest Ref Rng & Units 04/20/2019 11/04/2018 10/28/2018  Glucose 70 - 99 mg/dL 96 160(H) 132(H)  BUN 6 - 23 mg/dL 45(H) 25(H) 62(H)  Creatinine 0.40 - 1.50 mg/dL 1.95(H) 1.94(H) 2.51(H)  BUN/Creat Ratio 10 - 24 - - -  Sodium 135 - 145 mEq/L 144 143 142  Potassium 3.5 - 5.1 mEq/L 5.2(H) 4.6 4.4  Chloride 96 - 112 mEq/L 99 104 102  CO2 19 - 32 mEq/L 36(H) 31 31  Calcium 8.4 - 10.5 mg/dL 9.6 9.1 8.9    Current antihypertensive regimen:  Amlodipine 5 mg - 1 tablet daily  Losartan 25 mg - 1 tablet daily at bedtime  How often are you checking your Blood Pressure? infrequently   Current home BP readings:  147/95- 02/04/20  What recent interventions/DTPs have been made by any provider to improve Blood Pressure  control since last CPP Visit: No interventions or DTPs have been made.   Any recent hospitalizations or ED visits since last visit with CPP? No   What diet changes have been made to improve Blood Pressure Control?  States no changes. He currently has Covid and has not been eating very much. Notes he has lost 12 pounds.  What exercise is being done to improve your Blood Pressure Control?  Very sedentary right now due to virus but notes when he is healthy he is active around the house and does the grocery shopping and goes to the drug store.   Adherence Review: Is the patient currently on ACE/ARB medication? Yes Does the patient have >5 day gap between last estimated fill dates? CPP to review - refills timely per chart  Recent Relevant Labs: Lab Results  Component Value Date/Time   HGBA1C 7.2 (A) 10/07/2019 11:26 AM   HGBA1C 7.7 (H) 04/20/2019 10:26 AM   HGBA1C 7.3 (A) 02/10/2019 01:20 PM   HGBA1C 7.9 (H) 10/15/2018 02:11 PM   MICROALBUR 12.0 (H) 03/02/2015 11:42 AM   MICROALBUR  2.6 (H) 01/27/2014 11:49 AM    Kidney Function Lab Results  Component Value Date/Time   CREATININE 1.95 (H) 04/20/2019 10:26 AM   CREATININE 1.94 (H) 11/04/2018 03:25 PM   CREATININE 1.53 (H) 06/18/2013 11:08 AM   GFR 34.05 (L) 04/20/2019 10:26 AM   GFRNONAA 45 (L) 10/19/2018 08:25 AM   GFRNONAA 47 (L) 06/18/2013 11:08 AM   GFRAA 52 (L) 10/19/2018 08:25 AM   GFRAA 54 (L) 06/18/2013 11:08 AM    Current antihyperglycemic regimen:  Humulin N (insulin NPH) - Inject 290 units qhs  Novolin R (insulin regular) -  Eating 2 meals/day, injects 170 units before lunch and 200 units before dinner (15 minutes before meals)   What recent interventions/DTPs have been made to improve glycemic control:  No changes  Have there been any recent hospitalizations or ED visits since last visit with CPP? No   Patient denies hypoglycemic symptoms, including Pale, Sweaty, Shaky, Hungry, Nervous/irritable and Vision changes   Patient denies hyperglycemic symptoms, including blurry vision, excessive thirst, fatigue, polyuria and weakness   How often are you checking your blood sugar? twice daily   What are your blood sugars ranging?  Fasting: 120 Before meals: N/A After meals: N/A Bedtime: 124  During the week, how often does your blood glucose drop below 70? Never   Are you checking your feet daily/regularly? Yes- denies any wounds or sores  Adherence Review: Is the patient currently on a STATIN medication? Yes Is the patient currently on ACE/ARB medication? Yes Does the patient have >5 day gap between last estimated fill dates? CPP to review - refills timely per chart  Follow-Up:  Pharmacist Review   Debbora Dus, CPP notified  Margaretmary Dys, [place] Assistant [phone]  I have reviewed the care management and care coordination activities outlined in this encounter and I am certifying that I agree with the content of this note.  During CCM visit in October 2021 pt  reported home BP 120-125/60 mmHg. He missed CCM visit December 2021. Would like to schedule a follow up with me to ensure BP is at goal.  Debbora Dus, PharmD Clinical Pharmacist [facility] at [facility] [phone]

## 2020-02-04 ENCOUNTER — Other Ambulatory Visit: Payer: Self-pay

## 2020-02-04 ENCOUNTER — Telehealth (INDEPENDENT_AMBULATORY_CARE_PROVIDER_SITE_OTHER): Payer: Medicare Other | Admitting: Family Medicine

## 2020-02-04 ENCOUNTER — Other Ambulatory Visit: Payer: Self-pay | Admitting: Family Medicine

## 2020-02-04 ENCOUNTER — Encounter: Payer: Self-pay | Admitting: Family Medicine

## 2020-02-04 ENCOUNTER — Other Ambulatory Visit: Payer: Medicare Other

## 2020-02-04 VITALS — BP 147/55 | HR 60 | Temp 98.0°F

## 2020-02-04 DIAGNOSIS — E1122 Type 2 diabetes mellitus with diabetic chronic kidney disease: Secondary | ICD-10-CM | POA: Diagnosis not present

## 2020-02-04 DIAGNOSIS — R059 Cough, unspecified: Secondary | ICD-10-CM

## 2020-02-04 DIAGNOSIS — J22 Unspecified acute lower respiratory infection: Secondary | ICD-10-CM | POA: Diagnosis not present

## 2020-02-04 DIAGNOSIS — Z794 Long term (current) use of insulin: Secondary | ICD-10-CM | POA: Diagnosis not present

## 2020-02-04 DIAGNOSIS — I5032 Chronic diastolic (congestive) heart failure: Secondary | ICD-10-CM | POA: Diagnosis not present

## 2020-02-04 DIAGNOSIS — N1832 Chronic kidney disease, stage 3b: Secondary | ICD-10-CM | POA: Diagnosis not present

## 2020-02-04 MED ORDER — AZITHROMYCIN 250 MG PO TABS: ORAL_TABLET | ORAL | 0 refills | Status: DC

## 2020-02-04 MED ORDER — ALBUTEROL SULFATE HFA 108 (90 BASE) MCG/ACT IN AERS: 2.0000 | INHALATION_SPRAY | Freq: Four times a day (QID) | RESPIRATORY_TRACT | 0 refills | Status: AC | PRN

## 2020-02-04 NOTE — Progress Notes (Signed)
Patient ID: Donald [name], male    DOB: 1945-06-01, 75 y.o.   MRN: [id number]  Virtual visit completed through [employer], a video enabled telemedicine application. Due to national recommendations of social distancing due to COVID-19, a virtual visit is felt to be most appropriate for this patient at this time. Reviewed limitations, risks, security and privacy concerns of performing a virtual visit and the availability of in person appointments. I also reviewed that there may be a patient responsible charge related to this service. The patient agreed to proceed.   Patient location: home Provider location: [place] at [facility], office Persons participating in this virtual visit: patient, provider, wife  If any vitals were documented, they were collected by patient at home unless specified below.    BP (!) 147/55    Pulse 60    Temp 98 F (36.7 C)    CC: cough, congestion, fever Subjective:   HPI: HAIDAN NHAN is a 75 y.o. male presenting on 02/04/2020 for Cough, Nasal Congestion, and Fever   4d h/o cough, congestion, blowing out colored mucous, marked fatigue. Tmax 99.5. Mild L earache, bad headache. Mild wheezing. Sleeping ok.   No tooth pain, ST, chest pain or dyspnea, abd pain, nausea, diarrhea.   Managing symptoms with mucinex, tylenol Q4 hours and tussin cough medicine without significant benefit.   No known COVID exposure. Staying at home.   Came in earlier today for COVID swab.  [place] 02/2019 x2. He has been boosted (10/2019).   Known HFpEF, mild asthma worse in fall, h/o PE.  No known h/o COPD. Non smoker.      Relevant past medical, surgical, family and social history reviewed and updated as indicated. Interim medical history since our last visit reviewed. Allergies and medications reviewed and updated. Outpatient Medications Prior to Visit  Medication Sig Dispense Refill   allopurinol (ZYLOPRIM) 300 MG tablet TAKE ONE TABLET EVERY DAY 30  tablet 10   ALPRAZolam (XANAX) 1 MG tablet TAKE ONE TABLET AT BEDTIME IF NEEDED FORSLEEP 30 tablet 0   amLODipine (NORVASC) 5 MG tablet TAKE 1 TABLET BY MOUTH DAILY 90 tablet 0   aspirin EC 81 MG tablet Take 1 tablet (81 mg total) by mouth daily.     colchicine (COLCRYS) 0.6 MG tablet TAKE 1 TABLET BY MOUTH EVERY HOUR UNTIL SYMPTOMS ARE BETTER, MAX OF 6TABS PER DAY. STOP IF DIARRHEA 30 tablet 4   cyanocobalamin (,VITAMIN B-12,) 1000 MCG/ML injection Inject 1,000 mcg into the muscle every 30 (thirty) days.     Cyanocobalamin (B-12) 1000 MCG SUBL Place 1 tablet under the tongue daily.     ferrous sulfate 324 (65 Fe) MG TBEC Take 1 tablet (325 mg total) by mouth daily. 30 tablet    fluticasone (FLONASE) 50 MCG/ACT nasal spray Place 2 sprays into both nostrils daily. 16 g 1   furosemide (LASIX) 80 MG tablet TAKE ONE TABLET (80 MG) BY MOUTH EVERY DAY 90 tablet 1   gabapentin (NEURONTIN) 600 MG tablet TAKE TWO TABLETS AT BEDTIME 180 tablet 3   glucose blood (GE100 BLOOD GLUCOSE TEST) test strip 1 each by Other route 4 (four) times daily. E11.9 360 each 0   HYDROcodone-acetaminophen (NORCO) 10-325 MG tablet Take 1 tablet by mouth every 6 (six) hours as needed for moderate pain ((typically once to twice daily if needed)).  30 tablet 0   HYDROcodone-homatropine (HYCODAN) 5-1.5 MG/5ML syrup Take 5 mLs by mouth 2 (two) times daily as needed for  cough (sedation precautions). 120 mL 0   insulin NPH Human (HUMULIN N) 100 UNIT/ML injection Inject 2.9 mLs (290 Units total) into the skin at bedtime. 90 mL 5   insulin regular (NOVOLIN R RELION) 100 units/mL injection 3 times a day (just before each meal), 210-150-290 units, and syringes 4/day. 210 mL 0   losartan (COZAAR) 25 MG tablet TAKE ONE TABLET AT BEDTIME 90 tablet 3   lovastatin (MEVACOR) 40 MG tablet TAKE TWO TABLETS BY MOUTH AT BEDTIME 180 tablet 3   methylPREDNISolone (MEDROL) 4 MG tablet Take as directed 21 tablet 0    methylPREDNISolone (MEDROL) 4 MG tablet Take as directed 21 tablet 0   Naphazoline-Pheniramine (OPCON-A OP) Place 1 drop into both eyes 3 (three) times daily as needed (itchy eyes.).      ondansetron (ZOFRAN) 4 MG tablet Take 1 tablet (4 mg total) by mouth every 4 (four) hours as needed for nausea. 10 tablet 0   Potassium Citrate 15 MEQ (1620 MG) TBCR Take 1 tablet by mouth in the morning, at noon, in the evening, and at bedtime.     Sennosides-Docusate Sodium (STOOL SOFTENER & LAXATIVE PO) Take 2 tablets by mouth at bedtime.     No facility-administered medications prior to visit.     Per HPI unless specifically indicated in ROS section below Review of Systems Objective:  BP (!) 147/55    Pulse 60    Temp 98 F (36.7 C)   Wt Readings from Last 3 Encounters:  10/07/19 273 lb (123.8 kg)  06/18/19 278 lb (126.1 kg)  06/02/19 280 lb (127 kg)       Physical exam: Gen: alert, NAD, not ill appearing Pulm: speaks in complete sentences without increased work of breathing Psych: normal mood, normal thought content      Results for orders placed or performed in visit on 10/07/19  POCT glycosylated hemoglobin (Hb A1C)  Result Value Ref Range   Hemoglobin A1C 7.2 (A) 4.0 - 5.6 %   HbA1c POC (<> result, manual entry)     HbA1c, POC (prediabetic range)     HbA1c, POC (controlled diabetic range)     Assessment & Plan:  Advised to hold colchicine while on zpack.  Problem List Items Addressed This Visit    Type 2 diabetes mellitus with diabetic chronic kidney disease ([place])   Obesity, morbid, BMI 40.0-49.9 ([place])   Heart failure with preserved ejection fraction ([place])   Acute respiratory infection - Primary    Symptoms suspicious for COVID infection - test pending.  Supportive care reviewed. Red flags to seek urgent care over weekend reviewed.  In asthma hx and comorbidities, cover for atypical bronchitis with azithromycin course and refilled albuterol inhaler.  Will await COVID test.  If positive, he would be interested in further evaluation for possible COVID outpatient treatment.       Relevant Medications   azithromycin (ZITHROMAX) 250 MG tablet       Meds ordered this encounter  Medications   albuterol (VENTOLIN HFA) 108 (90 Base) MCG/ACT inhaler    Sig: Inhale 2 puffs into the lungs every 6 (six) hours as needed for wheezing or shortness of breath.    Dispense:  8 g    Refill:  0   azithromycin (ZITHROMAX) 250 MG tablet    Sig: Take two tablets on day one followed by one tablet on days 2-5    Dispense:  6 each    Refill:  0   No orders of  the defined types were placed in this encounter.   I discussed the assessment and treatment plan with the patient. The patient was provided an opportunity to ask questions and all were answered. The patient agreed with the plan and demonstrated an understanding of the instructions. The patient was advised to call back or seek an in-person evaluation if the symptoms worsen or if the condition fails to improve as anticipated.  Follow up plan: Return if symptoms worsen or fail to improve.  Ria Bush, MD

## 2020-02-04 NOTE — Assessment & Plan Note (Signed)
Symptoms suspicious for COVID infection - test pending.  Supportive care reviewed. Red flags to seek urgent care over weekend reviewed.  In asthma hx and comorbidities, cover for atypical bronchitis with azithromycin course and refilled albuterol inhaler.  Will await COVID test. If positive, he would be interested in further evaluation for possible COVID outpatient treatment.

## 2020-02-06 LAB — NOVEL CORONAVIRUS, NAA: SARS-CoV-2, NAA: DETECTED — AB

## 2020-02-06 LAB — SARS-COV-2, NAA 2 DAY TAT

## 2020-02-07 ENCOUNTER — Other Ambulatory Visit: Payer: Self-pay | Admitting: Family Medicine

## 2020-02-07 ENCOUNTER — Encounter: Payer: Self-pay | Admitting: Family Medicine

## 2020-02-07 ENCOUNTER — Telehealth (HOSPITAL_COMMUNITY): Payer: Self-pay | Admitting: Adult Health

## 2020-02-07 DIAGNOSIS — U071 COVID-19: Secondary | ICD-10-CM

## 2020-02-07 NOTE — Telephone Encounter (Signed)
Called to discuss with patient about COVID-19 symptoms and the use of one of the available treatments for those with mild to moderate Covid symptoms and at a high risk of hospitalization.  Pt appears to qualify for outpatient treatment due to co-morbid conditions and/or a member of an at-risk group in accordance with the FDA Emergency Use Authorization.      Unable to reach pt - LMOM   Keyia Moretto C Seth Friedlander   

## 2020-02-08 ENCOUNTER — Ambulatory Visit: Payer: Medicare Other | Admitting: Endocrinology

## 2020-02-08 ENCOUNTER — Ambulatory Visit: Payer: Medicare Other | Admitting: Podiatry

## 2020-02-14 NOTE — Telephone Encounter (Signed)
Ria Comment, could you call patient to reschedule a CCM visit for Feb or March. I would like him to check BP for 7 days prior to visit once daily in the morning. Please call if BP is consistently above 140/90. Hope he starts feeling better soon!  Debbora Dus, PharmD Clinical Pharmacist [facility] at [facility] [phone]

## 2020-02-15 NOTE — Chronic Care Management (AMB) (Addendum)
Contacted patient to schedule follow up for late February - March to review blood pressure with Debbora Dus. Patient states he is still in bed and not feeling well. Requests I call tomorrow around noon. Patient states he finished his Z-Pack about 1 week ago and cough returned yesterday. States he is taking tylenol, mucinex and cough syrup.   Margaretmary Dys, [place] [facility] Assistant [phone]

## 2020-02-16 ENCOUNTER — Telehealth: Payer: Self-pay

## 2020-02-16 DIAGNOSIS — IMO0002 Reserved for concepts with insufficient information to code with codable children: Secondary | ICD-10-CM

## 2020-02-16 DIAGNOSIS — E1165 Type 2 diabetes mellitus with hyperglycemia: Secondary | ICD-10-CM

## 2020-02-16 NOTE — Telephone Encounter (Signed)
Ordered

## 2020-02-16 NOTE — Chronic Care Management (AMB) (Addendum)
Contacted Donald [name] to schedule CCM follow up for late February - March for him to review blood pressure and medications with Donald [name].  Follow up appointment was scheduled for 03/10/20 at 10:00 AM.   Are you having any problems with your medications? Not at this time  Any concerns patient would like to discuss with the pharmacist? Not at this time. Hopes to have A1C done by his appointment.   Patient states he is feeling much better today, states he was able to get a good nights rest last night.   Patient reminded to have all medications, supplements and any blood glucose and blood pressure readings available for review with Donald [name], Pharm. D, at their televisit 03/10/20.   Follow-Up:  Pharmacist Review and Scheduled Follow-Up With Clinical Pharmacist  Donald [name], CPP notified  Donald [name], [place] [facility] Assistant [phone]  Scheduled appointment and requested A1c.  Donald [name], PharmD Clinical Pharmacist [facility] at [facility] [phone]

## 2020-02-16 NOTE — Telephone Encounter (Signed)
Donald [name], can someone call patient to schedule lab appointment for A1c in the next 2 weeks?  Thanks,  Debbora Dus, PharmD Clinical Pharmacist [facility] at [facility] [phone]

## 2020-02-16 NOTE — Telephone Encounter (Signed)
Patient has a telephone visit with me on 03/10/20 to review home BP and he would like to have his A1c checked prior to this visit. It has been 4 months since last A1c. Would you mind ordering?  Lab Results  Component Value Date/Time   HGBA1C 7.2 (A) 10/07/2019 11:26 AM   HGBA1C 7.7 (H) 04/20/2019 10:26 AM   HGBA1C 7.3 (A) 02/10/2019 01:20 PM   HGBA1C 7.9 (H) 10/15/2018 02:11 PM    Debbora Dus, PharmD Clinical Pharmacist [facility] at [facility] [phone]

## 2020-02-18 ENCOUNTER — Other Ambulatory Visit: Payer: Medicare Other

## 2020-02-25 ENCOUNTER — Telehealth: Payer: Self-pay

## 2020-02-25 NOTE — Telephone Encounter (Signed)
[place] Night - Client Nonclinical Telephone Record  [facility] Client [place] Night - Client Client Site [place] Physician Ria Bush - MD Contact Type Call Who Is Calling Patient / Member / Family / Caregiver Caller Name Shawndale Kilpatrick Phone Number [phone] Patient Name Donald [name] Patient DOB 1945/01/22 Call Type Message Only Information Provided Reason for Call Request to Schedule Office Appointment Initial Comment Caller requesting appointment for her husband. Additional Comment Requesting appointment because her husband is not bouncing back from his bout with covid that started January 24th. Very weak, no energy and shaking. Caller states if he can not come into the office virtual will be okay. Caller declined triage. Disp. Time Disposition Final User 02/25/2020 4:06:57 AM General Information Provided Yes Sloan Leiter Call Closed By: Sloan Leiter Transaction Date/Time: 02/25/2020 4:01:40 AM (ET)

## 2020-02-25 NOTE — Telephone Encounter (Signed)
Spoke with patient did not want to change his virtual visit stated he'll keep his appt. on Monday

## 2020-02-28 ENCOUNTER — Telehealth (INDEPENDENT_AMBULATORY_CARE_PROVIDER_SITE_OTHER): Payer: Medicare Other | Admitting: Family Medicine

## 2020-02-28 ENCOUNTER — Encounter: Payer: Self-pay | Admitting: Family Medicine

## 2020-02-28 VITALS — BP 159/60 | HR 68 | Temp 98.0°F | Ht 64.0 in

## 2020-02-28 DIAGNOSIS — G9332 Myalgic encephalomyelitis/chronic fatigue syndrome: Secondary | ICD-10-CM | POA: Insufficient documentation

## 2020-02-28 DIAGNOSIS — G933 Postviral fatigue syndrome: Secondary | ICD-10-CM | POA: Diagnosis not present

## 2020-02-28 DIAGNOSIS — E559 Vitamin D deficiency, unspecified: Secondary | ICD-10-CM

## 2020-02-28 DIAGNOSIS — E538 Deficiency of other specified B group vitamins: Secondary | ICD-10-CM | POA: Diagnosis not present

## 2020-02-28 DIAGNOSIS — I10 Essential (primary) hypertension: Secondary | ICD-10-CM

## 2020-02-28 DIAGNOSIS — G9331 Postviral fatigue syndrome: Secondary | ICD-10-CM

## 2020-02-28 DIAGNOSIS — U099 Post covid-19 condition, unspecified: Secondary | ICD-10-CM

## 2020-02-28 DIAGNOSIS — R5382 Chronic fatigue, unspecified: Secondary | ICD-10-CM | POA: Insufficient documentation

## 2020-02-28 MED ORDER — AMLODIPINE BESYLATE 10 MG PO TABS: 10.0000 mg | ORAL_TABLET | Freq: Every day | ORAL | 1 refills | Status: DC

## 2020-02-28 NOTE — Progress Notes (Signed)
Virtual Visit via Telephone Note  I connected with Donald [name] on 02/28/20 at 10:30 AM EST by telephone and verified that I am speaking with the correct person using two identifiers.  Location: Patient: home Provider: office   I discussed the limitations, risks, security and privacy concerns of performing an evaluation and management service by telephone and the availability of in person appointments. I also discussed with the patient that there may be a patient responsible charge related to this service. The patient expressed understanding and agreed to proceed.  Parties involved in encounter  Patient: Donald [name]  Provider:  Loura Pardon MD   History of Present Illness: 75 yo pt of Dr Darnell Level presents with covid symptoms  He has a h/o CAD and HTN as well as periph vasc dz  OSA and bronchitis as well as DM 2 and CKD and anemia   Tested 02/04/20 and positive for covid Still very fatigued and tremors in hands are worse  Headaches   Struggling to get back to normal  Is covid immunized   Weak and very tired Cannot get his strength back (shaky hands) -notes in both hands (aware when his glucose is low and that is not causing the shakes)  Runs high occ into 180s   Fluids- drinks a lot of fluids  Appetite- not as good as usual  bp is running high   Headaches -off an on (worse at night)  Severe when he had covid  Last few days more head pain in the back of his head    BP Readings from Last 3 Encounters:  02/28/20 (!) 159/60  02/04/20 (!) 147/55  10/07/19 (!) 158/60   Pulse Readings from Last 3 Encounters:  02/28/20 68  02/04/20 60  10/07/19 (!) 59    Amlodipine 5 mg daily Lasix 80 mg daily  Losartan 25 mg daily   Also CKD Lab Results  Component Value Date   CREATININE 1.95 (H) 04/20/2019   BUN 45 (H) 04/20/2019   NA 144 04/20/2019   K 5.2 (H) 04/20/2019   CL 99 04/20/2019   CO2 36 (H) 04/20/2019   DM control Lab Results  Component Value Date   HGBA1C 7.2  (A) 10/07/2019     Nausea is now better   Still coughing just a little- much better No sob  No wheezing    Patient Active Problem List   Diagnosis Date Noted  . Post-COVID-19 syndrome 02/28/2020  . Cervical radiculopathy 05/19/2019  . Acute on chronic renal insufficiency 11/04/2018  . Heart failure with preserved ejection fraction ([place]) 10/28/2018  . Pre-op evaluation 08/12/2018  . Fatigue 08/10/2018  . Carotid stenosis, asymptomatic, bilateral 03/29/2018  . Medicare annual wellness visit, subsequent 03/20/2018  . Advanced care planning/counseling discussion 03/20/2018  . Vitamin D deficiency 03/20/2018  . Vitamin B12 deficiency 03/20/2018  . Systolic murmur 81/82/9937  . Type 2 diabetes mellitus with diabetic chronic kidney disease ([place]) 12/19/2017  . Constipation 12/19/2017  . Chronic gout 12/19/2017  . Diabetic peripheral neuropathy ([place]) 10/06/2017  . Carpal tunnel syndrome of left wrist 09/19/2017  . PAD (peripheral artery disease) ([place]) 05/07/2017  . Upper airway cough syndrome 08/21/2016  . S/P parathyroidectomy ([place]) 04/18/2016  . Uncontrolled type 2 diabetes mellitus with peripheral neuropathy ([place]) 03/12/2016  . ECG abnormal 02/28/2016  . Loss of hearing 05/30/2015  . CKD stage 3 due to type 2 diabetes mellitus ([place]) 01/27/2014  . Obesity, morbid, BMI 40.0-49.9 ([place]) 03/08/2013  . Impotence of organic origin  03/11/2011  . CAD (coronary artery disease) 11/19/2010  . Recurrent nephrolithiasis 03/26/2010  . Acute sinusitis 02/27/2010  . HEMATURIA UNSPECIFIED 02/27/2010  . Dyslipidemia 04/13/2009  . ALLERGIC RHINITIS 04/13/2009  . DEPRESSION 03/14/2009  . PROTEINURIA, MILD 09/16/2008  . Acute respiratory infection 03/22/2008  . History of deep vein thrombosis (DVT) of lower extremity 12/03/2007  . OSA (obstructive sleep apnea) 12/03/2007  . History of pulmonary embolus (PE) 10/05/2007  . [place] DISEASE, LUMBAR 10/05/2007  . Chronic insomnia 08/21/2007  .  Pernicious anemia 11/20/2006  . Iron deficiency anemia 07/26/2006  . Essential hypertension 07/26/2006  . Asthmatic bronchitis 07/26/2006  . GERD 07/26/2006  . COLONIC POLYPS, HX OF 07/26/2006   Past Medical History:  Diagnosis Date  . (HFpEF) heart failure with preserved ejection fraction ([place])    a. 09/2018 Echo: EF 60-65%. PASP 25mmHg. Mild-mod LAE. Mild MR/TR.  Marland Kitchen ANEMIA-IRON DEFICIENCY 07/26/2006  . Anxiety   . ASTHMA 07/26/2006  . Asthma   . Back pain, chronic   . Bell's palsy   . CKD (chronic kidney disease), stage III ([place])   . COLONIC POLYPS, HX OF 07/26/2006  . COVID-19 virus infection 01/2020  . DEPRESSION 03/14/2009  . DIABETES MELLITUS, TYPE II 07/26/2006  . [place] DISEASE, LUMBAR 10/05/2007  . DVT 12/03/2007  . DYSLIPIDEMIA 04/13/2009  . Dyspnea    when gets up and walks around and back is hurting really bad -only Shortness of breath  then  . GERD 07/26/2006  . Gout   . Heart murmur   . History of kidney stones   . HYPERTENSION 07/26/2006  . INSOMNIA 08/21/2007  . Neuropathy   . Nonobstructive CAD (coronary artery disease)    a. 2012 Cath: no high grade stenosis; b. 2018 MV: No ischemia. Attenuation artifact.   . OBSTRUCTIVE SLEEP APNEA 12/03/2007   Use C-PAP  . PERIPHERAL NEUROPATHY 07/26/2006  . Pernicious anemia 11/20/2006  . PULMONARY EMBOLISM 10/05/2007   Past Surgical History:  Procedure Laterality Date  . APPENDECTOMY  1968  . [place], 06/14/2008   Dr. Trenton Gammon at [facility] (06/10)  . CARDIAC CATHETERIZATION    . CARPAL TUNNEL RELEASE Right 10/2017  . CATARACT EXTRACTION W/PHACO Left 05/30/2014   Procedure: CATARACT EXTRACTION PHACO AND INTRAOCULAR LENS PLACEMENT (IOC);  Surgeon: Estill Cotta, MD;  Location: [facility] ORS;  Service: Ophthalmology;  Laterality: Left;  [name] 01:20 AP% 23.7 CDE 31.86  . COLONOSCOPY  10/2002   HP, SSA, TA, rpt 3 yrs ([place])  . COLONOSCOPY  01/2005   diverticulosis rpt 5 yrs ([place])   . IR NEPHROSTOMY PLACEMENT LEFT   10/19/2018  . IR URETERAL STENT LEFT NEW ACCESS W/O SEP NEPHROSTOMY CATH  10/19/2018  . LITHOTRIPSY     X 2  . NEPHROLITHOTOMY Left 10/19/2018   Procedure: NEPHROLITHOTOMY PERCUTANEOUS;  Surgeon: Franchot Gallo, MD;  Location: [facility] ORS;  Service: Urology;  Laterality: Left;  3 HRS  . PARATHYROIDECTOMY Right 04/18/2016   PARATHYROIDECTOMY for cyst [name], MD)  . SHOULDER ARTHROSCOPY W/ ROTATOR CUFF REPAIR Right   . TONSILLECTOMY     Social History   Tobacco Use  . Smoking status: Never Smoker  . Smokeless tobacco: Never Used  Vaping Use  . Vaping Use: Never used  Substance Use Topics  . Alcohol use: No  . Drug use: No   Family History  Problem Relation Age of Onset  . Cancer Mother        Breast Cancer  . Cancer Sister  Breast Cancer  . Diabetes Father   . Heart disease Father   . Diabetes Paternal Grandfather    Allergies  Allergen Reactions  . Codeine Other (See Comments)    REACTION: chest pain  . Pioglitazone Other (See Comments)    REACTION to Actos: swelling in ankles   Current Outpatient Medications on File Prior to Visit  Medication Sig Dispense Refill  . albuterol (VENTOLIN HFA) 108 (90 Base) MCG/ACT inhaler Inhale 2 puffs into the lungs every 6 (six) hours as needed for wheezing or shortness of breath. 8 g 0  . allopurinol (ZYLOPRIM) 300 MG tablet TAKE ONE TABLET EVERY DAY 30 tablet 10  . ALPRAZolam (XANAX) 1 MG tablet TAKE ONE TABLET AT BEDTIME IF NEEDED FORSLEEP 30 tablet 0  . aspirin EC 81 MG tablet Take 1 tablet (81 mg total) by mouth daily.    . colchicine (COLCRYS) 0.6 MG tablet TAKE 1 TABLET BY MOUTH EVERY HOUR UNTIL SYMPTOMS ARE BETTER, MAX OF 6TABS PER DAY. STOP IF DIARRHEA 30 tablet 4  . cyanocobalamin (,VITAMIN B-12,) 1000 MCG/ML injection Inject 1,000 mcg into the muscle every 30 (thirty) days.    . Cyanocobalamin (B-12) 1000 MCG SUBL Place 1 tablet under the tongue daily.    . fluticasone (FLONASE) 50 MCG/ACT nasal spray Place 2  sprays into both nostrils daily. 16 g 1  . furosemide (LASIX) 80 MG tablet TAKE ONE TABLET (80 MG) BY MOUTH EVERY DAY 90 tablet 1  . gabapentin (NEURONTIN) 600 MG tablet TAKE TWO TABLETS AT BEDTIME 180 tablet 3  . glucose blood (GE100 BLOOD GLUCOSE TEST) test strip 1 each by Other route 4 (four) times daily. E11.9 360 each 0  . HYDROcodone-acetaminophen (NORCO) 10-325 MG tablet Take 1 tablet by mouth every 6 (six) hours as needed for moderate pain ((typically once to twice daily if needed)).  30 tablet 0  . HYDROcodone-homatropine (HYCODAN) 5-1.5 MG/5ML syrup Take 5 mLs by mouth 2 (two) times daily as needed for cough (sedation precautions). 120 mL 0  . insulin NPH Human (HUMULIN N) 100 UNIT/ML injection Inject 2.9 mLs (290 Units total) into the skin at bedtime. 90 mL 5  . insulin regular (NOVOLIN R RELION) 100 units/mL injection 3 times a day (just before each meal), 210-150-290 units, and syringes 4/day. 210 mL 0  . losartan (COZAAR) 25 MG tablet TAKE ONE TABLET AT BEDTIME 90 tablet 3  . lovastatin (MEVACOR) 40 MG tablet TAKE TWO TABLETS BY MOUTH AT BEDTIME 180 tablet 3  . Naphazoline-Pheniramine (OPCON-A OP) Place 1 drop into both eyes 3 (three) times daily as needed (itchy eyes.).     Marland Kitchen ondansetron (ZOFRAN) 4 MG tablet Take 1 tablet (4 mg total) by mouth every 4 (four) hours as needed for nausea. 10 tablet 0  . Potassium Citrate 15 MEQ (1620 MG) TBCR Take 1 tablet by mouth in the morning, at noon, in the evening, and at bedtime.    [name] Sodium (STOOL SOFTENER & LAXATIVE PO) Take 2 tablets by mouth at bedtime.     No current facility-administered medications on file prior to visit.   Review of Systems  Constitutional: Positive for malaise/fatigue. Negative for chills and fever.       Appetite is down  HENT: Negative for congestion, ear pain, sinus pain and sore throat.   Eyes: Negative for blurred vision, discharge and redness.  Respiratory: Positive for cough. Negative for  sputum production, shortness of breath and stridor.   Cardiovascular: Negative for chest  pain, palpitations and leg swelling.  Gastrointestinal: Negative for abdominal pain, diarrhea, nausea and vomiting.       No longer nausea  Musculoskeletal: Negative for myalgias.  Skin: Negative for rash.  Neurological: Positive for headaches. Negative for dizziness, tingling, sensory change and focal weakness.    Observations/Objective: Pt sounded well Not in distress Voice is not hoarse No audible cough/wheeze or sob during conversation  Nl mood, pleasant  Nl cognition, good historian  Assessment and Plan: Problem List Items Addressed This Visit      Cardiovascular and Mediastinum   Essential hypertension    Per pt- BP is up since contracting covid  BP: (!) 159/60 in the setting of headache and also fatigue  Takes amlodipine 5 mg daily Lasix 80 mg daily  Losartan 25 mg daily  In setting of CKD  Will inc amlodipine to 10 mg daily to see if this helps bp and headache  Disc poss side eff like pedal edema  Plan lab and f/u with pcp           Relevant Medications   amLODipine (NORVASC) 10 MG tablet   Other Relevant Orders   CBC with Differential/Platelet   Basic metabolic panel   TSH     Other   Vitamin D deficiency    Recent increase in fatigue (had covid last mo)  D level ordered for upcoming labs       Relevant Orders   VITAMIN D 25 Hydroxy (Vit-D Deficiency, Fractures)   Vitamin B12 deficiency    More fatigue since recent covid Appetite is off Level ordered for upcoming labs       Relevant Orders   Vitamin B12   Fatigue - Primary    Likely post covid syndrome (less than a month)  Labs ordered for upcoming draw incl cbc and tsh and D and B12 with bmet  Pending Planning f/u with pcp Adv to call if worse or new symptoms in the interim       Relevant Orders   CBC with Differential/Platelet   Basic metabolic panel   TSH   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    Primarily  fatigue , some headaches and elevated bp  No sob or chest discomfort  Disc ER precautions  Labs ordered Planning f/u with pcp          Follow Up Instructions: Increase your amlodipine to 10 mg once daily  Hope this will help blood pressure and headache  Stay hydrated and continue other medicines  Watch for shortness of breath and chest tightness or pain   Keep lab appt for 2/24  We will schedule an appt with Dr Darnell Level after that to touch base  If any symptoms become severe please go to the ER Otherwise let us know    I discussed the assessment and treatment plan with the patient. The patient was provided an opportunity to ask questions and all were answered. The patient agreed with the plan and demonstrated an understanding of the instructions.   The patient was advised to call back or seek an in-person evaluation if the symptoms worsen or if the condition fails to improve as anticipated.  I provided 23 minutes of non-face-to-face time during this encounter.   Loura Pardon, MD

## 2020-02-28 NOTE — Assessment & Plan Note (Signed)
Recent increase in fatigue (had covid last mo)  D level ordered for upcoming labs

## 2020-02-28 NOTE — Assessment & Plan Note (Addendum)
Primarily fatigue , some headaches and elevated bp  No sob or chest discomfort  Disc ER precautions  Labs ordered Planning f/u with pcp

## 2020-02-28 NOTE — Assessment & Plan Note (Signed)
Likely post covid syndrome (less than a month)  Labs ordered for upcoming draw incl cbc and tsh and D and B12 with bmet  Pending Planning f/u with pcp Adv to call if worse or new symptoms in the interim

## 2020-02-28 NOTE — Patient Instructions (Addendum)
Increase your amlodipine to 10 mg once daily  Hope this will help blood pressure and headache  Stay hydrated and continue other medicines  Watch for shortness of breath and chest tightness or pain   Keep lab appt for 2/24  We will schedule an appt with Dr Darnell Level after that to touch base  If any symptoms become severe please go to the ER Otherwise let us know

## 2020-02-28 NOTE — Assessment & Plan Note (Signed)
Per pt- BP is up since contracting covid  BP: (!) 159/60 in the setting of headache and also fatigue  Takes amlodipine 5 mg daily Lasix 80 mg daily  Losartan 25 mg daily  In setting of CKD  Will inc amlodipine to 10 mg daily to see if this helps bp and headache  Disc poss side eff like pedal edema  Plan lab and f/u with pcp

## 2020-02-28 NOTE — Assessment & Plan Note (Signed)
More fatigue since recent covid Appetite is off Level ordered for upcoming labs

## 2020-03-01 ENCOUNTER — Other Ambulatory Visit: Payer: Self-pay | Admitting: Family Medicine

## 2020-03-01 MED ORDER — COLCHICINE 0.6 MG PO TABS: 0.6000 mg | ORAL_TABLET | Freq: Every day | ORAL | 4 refills | Status: DC | PRN

## 2020-03-01 NOTE — Progress Notes (Signed)
Wife requests Rx at her office visit.

## 2020-03-07 DIAGNOSIS — R600 Localized edema: Secondary | ICD-10-CM | POA: Diagnosis not present

## 2020-03-07 DIAGNOSIS — I700 Atherosclerosis of aorta: Secondary | ICD-10-CM | POA: Diagnosis not present

## 2020-03-07 DIAGNOSIS — N200 Calculus of kidney: Secondary | ICD-10-CM | POA: Diagnosis not present

## 2020-03-07 DIAGNOSIS — N2889 Other specified disorders of kidney and ureter: Secondary | ICD-10-CM | POA: Diagnosis not present

## 2020-03-09 ENCOUNTER — Other Ambulatory Visit (INDEPENDENT_AMBULATORY_CARE_PROVIDER_SITE_OTHER): Payer: Medicare Other

## 2020-03-09 ENCOUNTER — Other Ambulatory Visit: Payer: Medicare Other

## 2020-03-09 ENCOUNTER — Other Ambulatory Visit: Payer: Self-pay

## 2020-03-09 DIAGNOSIS — E1142 Type 2 diabetes mellitus with diabetic polyneuropathy: Secondary | ICD-10-CM | POA: Diagnosis not present

## 2020-03-09 DIAGNOSIS — E559 Vitamin D deficiency, unspecified: Secondary | ICD-10-CM | POA: Diagnosis not present

## 2020-03-09 DIAGNOSIS — G933 Postviral fatigue syndrome: Secondary | ICD-10-CM

## 2020-03-09 DIAGNOSIS — E1165 Type 2 diabetes mellitus with hyperglycemia: Secondary | ICD-10-CM | POA: Diagnosis not present

## 2020-03-09 DIAGNOSIS — I10 Essential (primary) hypertension: Secondary | ICD-10-CM | POA: Diagnosis not present

## 2020-03-09 DIAGNOSIS — E538 Deficiency of other specified B group vitamins: Secondary | ICD-10-CM

## 2020-03-09 DIAGNOSIS — IMO0002 Reserved for concepts with insufficient information to code with codable children: Secondary | ICD-10-CM

## 2020-03-09 DIAGNOSIS — G9331 Postviral fatigue syndrome: Secondary | ICD-10-CM

## 2020-03-10 ENCOUNTER — Ambulatory Visit (INDEPENDENT_AMBULATORY_CARE_PROVIDER_SITE_OTHER): Payer: Medicare Other

## 2020-03-10 ENCOUNTER — Ambulatory Visit: Payer: Medicare Other | Admitting: Family Medicine

## 2020-03-10 DIAGNOSIS — I10 Essential (primary) hypertension: Secondary | ICD-10-CM | POA: Diagnosis not present

## 2020-03-10 DIAGNOSIS — Z794 Long term (current) use of insulin: Secondary | ICD-10-CM

## 2020-03-10 DIAGNOSIS — N1832 Chronic kidney disease, stage 3b: Secondary | ICD-10-CM | POA: Diagnosis not present

## 2020-03-10 DIAGNOSIS — E1122 Type 2 diabetes mellitus with diabetic chronic kidney disease: Secondary | ICD-10-CM | POA: Diagnosis not present

## 2020-03-10 LAB — CBC WITH DIFFERENTIAL/PLATELET
Basophils Absolute: 0.1 10*3/uL (ref 0.0–0.1)
Basophils Relative: 1.2 % (ref 0.0–3.0)
Eosinophils Absolute: 0.3 10*3/uL (ref 0.0–0.7)
Eosinophils Relative: 4.2 % (ref 0.0–5.0)
HCT: 41.3 % (ref 39.0–52.0)
Hemoglobin: 13.5 g/dL (ref 13.0–17.0)
Lymphocytes Relative: 23.4 % (ref 12.0–46.0)
Lymphs Abs: 1.6 10*3/uL (ref 0.7–4.0)
MCHC: 32.8 g/dL (ref 30.0–36.0)
MCV: 89.4 fl (ref 78.0–100.0)
Monocytes Absolute: 0.5 10*3/uL (ref 0.1–1.0)
Monocytes Relative: 7.2 % (ref 3.0–12.0)
Neutro Abs: 4.3 10*3/uL (ref 1.4–7.7)
Neutrophils Relative %: 64 % (ref 43.0–77.0)
Platelets: 198 10*3/uL (ref 150.0–400.0)
RBC: 4.62 Mil/uL (ref 4.22–5.81)
RDW: 16.8 % — ABNORMAL HIGH (ref 11.5–15.5)
WBC: 6.8 10*3/uL (ref 4.0–10.5)

## 2020-03-10 LAB — BASIC METABOLIC PANEL
BUN: 26 mg/dL — ABNORMAL HIGH (ref 6–23)
CO2: 34 mEq/L — ABNORMAL HIGH (ref 19–32)
Calcium: 9.4 mg/dL (ref 8.4–10.5)
Chloride: 102 mEq/L (ref 96–112)
Creatinine, Ser: 1.74 mg/dL — ABNORMAL HIGH (ref 0.40–1.50)
GFR: 38.77 mL/min — ABNORMAL LOW (ref >60.00)
Glucose, Bld: 76 mg/dL (ref 70–99)
Potassium: 4.6 mEq/L (ref 3.5–5.1)
Sodium: 145 mEq/L (ref 135–145)

## 2020-03-10 LAB — HEMOGLOBIN A1C: Hgb A1c MFr Bld: 7.8 % — ABNORMAL HIGH (ref 4.6–6.5)

## 2020-03-10 LAB — TSH: TSH: 3.01 u[IU]/mL (ref 0.35–4.50)

## 2020-03-10 LAB — VITAMIN B12: Vitamin B-12: 814 pg/mL (ref 211–911)

## 2020-03-10 LAB — VITAMIN D 25 HYDROXY (VIT D DEFICIENCY, FRACTURES): VITD: 21.33 ng/mL — ABNORMAL LOW (ref 30.00–100.00)

## 2020-03-10 NOTE — Progress Notes (Signed)
Chronic Care Management Pharmacy Note  03/13/2020 Name:  Donald [name] MRN:  [id number] DOB:  [birth year]  Subjective: Donald [name] is an 75 y.o. year old male who is a primary patient of Ria Bush, MD.  The CCM team was consulted for assistance with disease management and care coordination needs.    Engaged with patient by telephone for follow up visit in response to provider referral for pharmacy case management and/or care coordination services.   Consent to Services:  The patient was given information about Chronic Care Management services, agreed to services, and gave verbal consent prior to initiation of services.  Please see initial visit note for detailed documentation.   Patient Care Team: Ria Bush, MD as PCP - General (Family Medicine) Rockey Situ Kathlene November, MD as PCP - Cardiology (Cardiology) Suella Broad, MD (Physical Medicine and Rehabilitation) Earnie Larsson, MD as Attending Physician (Neurosurgery) Rigoberto Noel, MD as Consulting Physician (Pulmonary Disease) Dingeldein, Remo Lipps, MD (Ophthalmology) Minna Merritts, MD as Consulting Physician (Cardiology) Debbora Dus, [facility] as Pharmacist (Pharmacist)  Recent Office visits:  06/23/19: Acute sinusitis - 10 day course Augmentin  06/18/19: Nasal congestion - resume allergy medications  Recent Consult Visits:   10/15/19: Podiatry - injection to foot. Continue gabapentin nightly.   10/07/19: Endocrinology - bp is high at visit. Referred to PCP for recheck. Continue same insulins. Give 10 extra units anytime >200 mg/dL due to steroid.   09/28/19: Podiatry - degenerative joint disease tarsometatarsal. Injection into foot, methylprednisolone dose pack given. X-rays reviewed.   06/02/19: Endocrinology -  BP elevated, f/u with PCP; continue insulin dosing as prescribed, take an extra 10 units for any BG > 200 after steroid injection   Hospital visits: None in previous 6 months  Objective:  Lab  Results  Component Value Date   CREATININE 1.74 (H) 03/09/2020   BUN 26 (H) 03/09/2020   GFR 38.77 (L) 03/09/2020   GFRNONAA 45 (L) 10/19/2018   GFRAA 52 (L) 10/19/2018   NA 145 03/09/2020   K 4.6 03/09/2020   CALCIUM 9.4 03/09/2020   CO2 34 (H) 03/09/2020    Lab Results  Component Value Date/Time   HGBA1C 7.8 (H) 03/09/2020 02:12 PM   HGBA1C 7.2 (A) 10/07/2019 11:26 AM   HGBA1C 7.7 (H) 04/20/2019 10:26 AM   FRUCTOSAMINE 319 (H) 10/21/2017 04:54 PM   FRUCTOSAMINE 296 (H) 03/11/2011 10:48 AM   GFR 38.77 (L) 03/09/2020 02:12 PM   GFR 34.05 (L) 04/20/2019 10:26 AM   MICROALBUR 12.0 (H) 03/02/2015 11:42 AM   MICROALBUR 2.6 (H) 01/27/2014 11:49 AM    Last diabetic Eye exam:  Lab Results  Component Value Date/Time   HMDIABEYEEXA Retinopathy (A) 05/21/2017 03:54 PM    Last diabetic Foot exam: up to date per endocrinology   Lab Results  Component Value Date   CHOL 113 04/20/2019   HDL 32.00 (L) 04/20/2019   LDLCALC 59 04/20/2019   LDLDIRECT 52.2 05/25/2009   TRIG 112.0 04/20/2019   CHOLHDL 4 04/20/2019    Hepatic Function Latest Ref Rng & Units 04/20/2019 11/04/2018 10/28/2018  Total Protein 6.0 - 8.3 g/dL 6.5 - 6.1  Albumin 3.5 - 5.2 g/dL 4.3 4.0 3.6  AST 0 - 37 U/L 15 - 10  ALT 0 - 53 U/L 19 - 13  Alk Phosphatase 39 - 117 U/L 80 - 80  Total Bilirubin 0.2 - 1.2 mg/dL 1.0 - 1.1  Bilirubin, Direct 0.0 - 0.3 mg/dL - - -    Lab  Results  Component Value Date/Time   TSH 3.01 03/09/2020 02:12 PM   TSH 3.14 10/21/2017 04:54 PM    CBC Latest Ref Rng & Units 03/09/2020 04/20/2019 10/28/2018  WBC 4.0 - 10.5 K/uL 6.8 9.1 8.1  Hemoglobin 13.0 - 17.0 g/dL 13.5 14.1 10.2(L)  Hematocrit 39.0 - 52.0 % 41.3 43.2 30.8(L)  Platelets 150.0 - 400.0 K/uL 198.0 180.0 220.0    Lab Results  Component Value Date/Time   VD25OH 21.33 (L) 03/09/2020 02:12 PM   VD25OH 65.24 08/10/2018 11:34 AM    Clinical ASCVD: Yes  The ASCVD Risk score Mikey Bussing DC Jr., et al., 2013) failed to calculate  for the following reasons:   The valid total cholesterol range is 130 to 320 mg/dL    Depression screen [facility] 2/9 04/20/2019 03/20/2018  Decreased Interest 0 0  Down, Depressed, Hopeless 0 0  PHQ - 2 Score 0 0  Altered sleeping 0 -  Tired, decreased energy 0 -  Change in appetite 0 -  Feeling bad or failure about yourself  0 -  Trouble concentrating 0 -  Moving slowly or fidgety/restless 0 -  Suicidal thoughts 0 -  PHQ-9 Score 0 -  Difficult doing work/chores Not difficult at all -  Some recent data might be hidden    Social History   Tobacco Use  Smoking Status Never Smoker  Smokeless Tobacco Never Used   BP Readings from Last 3 Encounters:  02/28/20 (!) 159/60  02/04/20 (!) 147/55  10/07/19 (!) 158/60   Pulse Readings from Last 3 Encounters:  02/28/20 68  02/04/20 60  10/07/19 (!) 59   Wt Readings from Last 3 Encounters:  10/07/19 273 lb (123.8 kg)  06/18/19 278 lb (126.1 kg)  06/02/19 280 lb (127 kg)    Assessment/Interventions: Review of patient past medical history, allergies, medications, health status, including review of consultants reports, laboratory and other test data, was performed as part of comprehensive evaluation and provision of chronic care management services.   SDOH:  (Social Determinants of Health) assessments and interventions performed: Yes SDOH Interventions   Flowsheet Row Most Recent Value  SDOH Interventions   Financial Strain Interventions --  [Apply for insulin assistance]      CCM Care Plan  Allergies  Allergen Reactions  . Codeine Other (See Comments)    REACTION: chest pain  . Pioglitazone Other (See Comments)    REACTION to Actos: swelling in ankles    Medications Reviewed Today    Reviewed by Abner Greenspan, MD (Physician) on 02/28/20 at [facility] List Status: <None>  Medication Order Taking? Sig Documenting Provider Last Dose Status Informant  albuterol (VENTOLIN HFA) 108 (90 Base) MCG/ACT inhaler [id number] Yes Inhale 2  puffs into the lungs every 6 (six) hours as needed for wheezing or shortness of breath. Ria Bush, MD Taking Active   allopurinol (ZYLOPRIM) 300 MG tablet [id number] Yes TAKE ONE TABLET EVERY DAY Ria Bush, MD Taking Active   ALPRAZolam Duanne Moron) 1 MG tablet [id number] Yes TAKE ONE TABLET AT BEDTIME IF NEEDED Ardeen Garland, MD Taking Active   amLODipine (NORVASC) 5 MG tablet [id number] Yes TAKE 1 TABLET BY MOUTH DAILY Ria Bush, MD Taking Active   aspirin EC 81 MG tablet [id number] Yes Take 1 tablet (81 mg total) by mouth daily. Ria Bush, MD Taking Active   colchicine (COLCRYS) 0.6 MG tablet [id number] Yes TAKE 1 TABLET BY MOUTH EVERY HOUR UNTIL SYMPTOMS ARE BETTER, MAX OF 6TABS PER DAY. STOP IF DIARRHEA  Renato Shin, MD Taking Active Self  cyanocobalamin (,VITAMIN B-12,) 1000 MCG/ML injection [id number] Yes Inject 1,000 mcg into the muscle every 30 (thirty) days. [provider] Taking Active Self  Cyanocobalamin (B-12) 1000 MCG SUBL [id number] Yes Place 1 tablet under the tongue daily. Ria Bush, MD Taking Active   fluticasone [facility]) 50 MCG/ACT nasal spray [id number] Yes Place 2 sprays into both nostrils daily. Elby Beck, FNP Taking Active   furosemide (LASIX) 80 MG tablet [id number] Yes TAKE ONE TABLET (80 MG) BY MOUTH EVERY DAY Ria Bush, MD Taking Active   gabapentin (NEURONTIN) 600 MG tablet [id number] Yes TAKE TWO TABLETS AT BEDTIME Ria Bush, MD Taking Active   glucose blood (GE100 BLOOD GLUCOSE TEST) test strip [id number] Yes 1 each by Other route 4 (four) times daily. E11.9 Renato Shin, MD Taking Active   HYDROcodone-acetaminophen [facility]) 10-325 MG tablet [id number] Yes Take 1 tablet by mouth every 6 (six) hours as needed for moderate pain ((typically once to twice daily if needed)).  Ria Bush, MD Taking Active Self  HYDROcodone-homatropine [facility]) 5-1.5 MG/5ML syrup [id number] Yes Take 5 mLs by mouth 2  (two) times daily as needed for cough (sedation precautions). Ria Bush, MD Taking Active   insulin NPH Human (HUMULIN N) 100 UNIT/ML injection [id number] Yes Inject 2.9 mLs (290 Units total) into the skin at bedtime. Renato Shin, MD Taking Active Self  insulin regular (NOVOLIN R RELION) 100 units/mL injection [id number] Yes 3 times a day (just before each meal), 210-150-290 units, and syringes 4/day. Renato Shin, MD Taking Active   losartan (COZAAR) 25 MG tablet [id number] Yes TAKE ONE TABLET AT BEDTIME Ria Bush, MD Taking Active   lovastatin (MEVACOR) 40 MG tablet [id number] Yes TAKE TWO TABLETS BY MOUTH AT BEDTIME Ria Bush, MD Taking Active   Naphazoline-Pheniramine [facility]) [id number] Yes Place 1 drop into both eyes 3 (three) times daily as needed (itchy eyes.).  [provider] Taking Active Self  ondansetron (ZOFRAN) 4 MG tablet [id number] Yes Take 1 tablet (4 mg total) by mouth every 4 (four) hours as needed for nausea. Lesleigh Noe, MD Taking Active Self  Potassium Citrate 15 MEQ (1620 MG) TBCR [id number] Yes Take 1 tablet by mouth in the morning, at noon, in the evening, and at bedtime. Ria Bush, MD Taking Active   Sennosides-Docusate Sodium (STOOL SOFTENER & LAXATIVE PO) [id number] Yes Take 2 tablets by mouth at bedtime. [provider] Taking Active Self  Med List Note Marylynn Pearson 10/12/18 1538): cpap machine at bedtime          Patient Active Problem List   Diagnosis Date Noted  . Post-COVID-19 syndrome 02/28/2020  . Cervical radiculopathy 05/19/2019  . Acute on chronic renal insufficiency 11/04/2018  . Heart failure with preserved ejection fraction ([place]) 10/28/2018  . Pre-op evaluation 08/12/2018  . Fatigue 08/10/2018  . Carotid stenosis, asymptomatic, bilateral 03/29/2018  . Medicare annual wellness visit, subsequent 03/20/2018  . Advanced care planning/counseling discussion 03/20/2018  . Vitamin D deficiency  03/20/2018  . Vitamin B12 deficiency 03/20/2018  . Systolic murmur 34/91/7915  . Type 2 diabetes mellitus with diabetic chronic kidney disease ([place]) 12/19/2017  . Constipation 12/19/2017  . Chronic gout 12/19/2017  . Diabetic peripheral neuropathy ([place]) 10/06/2017  . Carpal tunnel syndrome of left wrist 09/19/2017  . PAD (peripheral artery disease) ([place]) 05/07/2017  . Upper airway cough syndrome 08/21/2016  . S/P parathyroidectomy ([place]) 04/18/2016  . Uncontrolled type 2 diabetes mellitus with peripheral neuropathy ([place])  03/12/2016  . ECG abnormal 02/28/2016  . Loss of hearing 05/30/2015  . CKD stage 3 due to type 2 diabetes mellitus (HCC) 01/27/2014  . Obesity, morbid, BMI 40.0-49.9 (HCC) 03/08/2013  . Impotence of organic origin 03/11/2011  . CAD (coronary artery disease) 11/19/2010  . Recurrent nephrolithiasis 03/26/2010  . Acute sinusitis 02/27/2010  . HEMATURIA UNSPECIFIED 02/27/2010  . Dyslipidemia 04/13/2009  . ALLERGIC RHINITIS 04/13/2009  . DEPRESSION 03/14/2009  . PROTEINURIA, MILD 09/16/2008  . Acute respiratory infection 03/22/2008  . History of deep vein thrombosis (DVT) of lower extremity 12/03/2007  . OSA (obstructive sleep apnea) 12/03/2007  . History of pulmonary embolus (PE) 10/05/2007  . DISC DISEASE, LUMBAR 10/05/2007  . Chronic insomnia 08/21/2007  . Pernicious anemia 11/20/2006  . Iron deficiency anemia 07/26/2006  . Essential hypertension 07/26/2006  . Asthmatic bronchitis 07/26/2006  . GERD 07/26/2006  . COLONIC POLYPS, HX OF 07/26/2006    Immunization History  Administered Date(s) Administered  . Influenza Split 10/05/2010, 10/15/2011  . Influenza Whole 10/05/2007, 10/28/2008, 10/03/2009  . Influenza, High Dose Seasonal PF 10/21/2017  . Influenza,inj,Quad PF,6+ Mos 10/08/2012, 09/17/2013, 10/03/2014, 10/09/2015, 10/08/2018  . Influenza,inj,quad, With Preservative 10/14/2016, 10/15/2018  . Influenza-Unspecified 10/22/2016  . PFIZER(Purple  Top)SARS-COV-2 Vaccination 02/20/2019, 03/13/2019, 10/26/2019  . Pneumococcal Conjugate-13 08/30/2014  . Pneumococcal Polysaccharide-23 11/15/2002, 09/15/2007, 03/20/2018  . Td 04/21/2008  . Zoster 10/18/2009, 06/24/2011    Conditions to be addressed/monitored:  Hypertension and Diabetes  Care Plan : CCM Pharmacy Care Plan  Updates made by Phil Dopp, RPH since 03/13/2020 12:00 AM    Problem: CHL AMB "PATIENT-SPECIFIC PROBLEM"     Long-Range Goal: Disease Management   Start Date: 03/10/2020  Priority: High  Note:    Current Barriers:  . Unable to independently afford treatment regimen  . Unable to maintain control of blood pressure  Pharmacist Clinical Goal(s):  Marland Kitchen Over the next 30 days, patient will verbalize ability to afford treatment regimen . maintain control of diabetes as evidenced by patient report of BG readings within goal  through collaboration with PharmD and provider.   Interventions: . 1:1 collaboration with [name], MD regarding development and update of comprehensive plan of care as evidenced by provider attestation and co-signature . Inter-disciplinary care team collaboration (see longitudinal plan of care) . Comprehensive medication review performed; medication list updated in electronic medical record  Feels like energy has improved some since last office visit.  Hypertension (BP goal <140/90) Query -Controlled -Current treatment:  Amlodipine 10 mg - 1 tablet daily (increased 02/28/20)  Losartan 25 mg - 1 tablet daily at bedtime  Lasix 80 mg - 1 tablet daily  -Medications previously tried:  -Current home readings: 126/36, 54 (today during visit), 156/48, 53 (today, after 2-3 min recheck) - uses Omron, arm cuff.   - Has checked home BP about twice in last few weeks. Running 130s/high 40s-low 50s. HR - 50s. -Denies hypotensive/hypertensive symptoms -Educated on Importance of home blood pressure monitoring; Proper BP monitoring  technique; Symptoms of hypotension and importance of maintaining adequate hydration;  Drinks diet 7-up and diet coke, diet dr. Reino Kent, all caffiene free. Reports not drinking enough water.  -Counseled to monitor BP at home twice weekly, document, and provide log at future appointments -Recommended to continue current medication; Bring log with you to PCP and Dr. [name].  Diabetes (A1c goal <7%) -Controlled -Current medications:  Humulin N (insulin NPH) - Injects 140-200 at bedtime (pt report)  Novolin R (insulin regular) -  Injects 120 units  before lunch and 200 units before dinner (15 minutes before meals) (Pt report) -Medications previously tried: none -Adherence:  Taking Novolin 120 before breakfast and up to 200 units before lunch and supper (appetitie is down, lost 12 lbs during COVID, has gained 4-5); has reduced his bedtime Humulin from 200 units to 140 units due to low BG 67 in the AM -Current home glucose readings - checking 3-4 times per day  . fasting glucose: (2/21) 110, 118,130,  99, 130, reports a couple lows in the morning prior to reducing his bedtime insulin to current dose . post prandial glucose: 120-140s  -Reports hypoglycemic symptoms. Denies any < 70 since reducing his Humulin dose. Denies hyperglycemic symptoms. -Insulin cost affordable, but high. Purchases OTC from [facility]. -Educated onSick day management: glucometer readings QID until well and stable, maintain hydration with high fluid intake and call MD if glucose above 400 -Counseled to check feet daily and get yearly eye exams  -Feet checked by Dr. Loanne Drilling every 3 months  Discussed benefits of GLP-1 agonist on weight loss/CV risk. Patient will discuss with endo at next visit. -Recommended to continue current medication   Patient Goals/Self-Care Activities . Over the next 30  days, patient will:  - take medications as prescribed check glucose 3-4 times daily, document, and provide at future appointments check  blood pressure twice weekly, document, and provide at future appointments  Medication Assistance: Application for Novolin and Humulin  medication assistance program. in process.  Anticipated assistance start date 03/28/20.  See plan of care for additional detail.  Mail  PAPs for insulin. Send provider portion to Dr. Loanne Drilling.     Patient's preferred pharmacy is:  [place], [place] - [address] [place] [address] Phone: [phone] Fax: [phone]  [place] [address] [place] - [place] [address] Phone: [phone] Fax: [phone]  Uses pill box? Yes Pt endorses 100% compliance  Care Plan and Follow Up Patient Decision:  Patient agrees to Care Plan and Follow-up.  Plan: The care management team will reach out to the patient again over the next 30 days.  Debbora Dus, PharmD Clinical Pharmacist [facility] [phone]

## 2020-03-13 ENCOUNTER — Ambulatory Visit: Payer: Medicare Other | Admitting: Family Medicine

## 2020-03-13 NOTE — Patient Instructions (Signed)
Dear Donald [name],  Below is a summary of the goals we discussed during our follow up appointment on March 10, 2020. Please contact me anytime with questions or concerns.   Visit Information   Patient Care Plan: CCM Pharmacy Care Plan    Problem Identified: CHL AMB "PATIENT-SPECIFIC PROBLEM"     Long-Range Goal: Disease Management   Start Date: 03/10/2020  Priority: High  Note:    Current Barriers:  . Unable to independently afford treatment regimen  . Unable to maintain control of blood pressure  Pharmacist Clinical Goal(s):  Marland Kitchen Over the next 30 days, patient will verbalize ability to afford treatment regimen . maintain control of diabetes as evidenced by patient report of BG readings within goal  through collaboration with PharmD and provider.   Interventions: . 1:1 collaboration with Donald Bush, MD regarding development and update of comprehensive plan of care as evidenced by provider attestation and co-signature . Inter-disciplinary care team collaboration (see longitudinal plan of care) . Comprehensive medication review performed; medication list updated in electronic medical record  Feels like energy has improved some since last office visit.  Hypertension (BP goal <140/90) Query -Controlled -Current treatment:  Amlodipine 10 mg - 1 tablet daily (increased 02/28/20)  Losartan 25 mg - 1 tablet daily at bedtime  Lasix 80 mg - 1 tablet daily  -Medications previously tried:  -Current home readings: 126/36, 54 (today during visit), 156/48, 53 (today, after 2-3 min recheck) - uses Omron, arm cuff.   - Has checked home BP about twice in last few weeks. Running 130s/high 40s-low 50s. HR - 50s. -Denies hypotensive/hypertensive symptoms -Educated on Importance of home blood pressure monitoring; Proper BP monitoring technique; Symptoms of hypotension and importance of maintaining adequate hydration;  Drinks diet 7-up and diet coke, diet dr. [name], all caffiene  free. Reports not drinking enough water.  -Counseled to monitor BP at home twice weekly, document, and provide log at future appointments -Recommended to continue current medication; Bring log with you to PCP and Donald [name].  Diabetes (A1c goal <7%) -Controlled -Current medications:  Humulin N (insulin NPH) - Injects 140-200 at bedtime (pt report)  Novolin R (insulin regular) -  Injects 120 units before lunch and 200 units before dinner (15 minutes before meals) (Pt report) -Medications previously tried: none -Adherence:  Taking Novolin 120 before breakfast and up to 200 units before lunch and supper (appetitie is down, lost 12 lbs during COVID, has gained 4-5); has reduced his bedtime Humulin from 200 units to 140 units due to low BG 67 in the AM -Current home glucose readings - checking 3-4 times per day  . fasting glucose: (2/21) 110, 118,130,  99, 130, reports a couple lows in the morning prior to reducing his bedtime insulin to current dose . post prandial glucose: 120-140s  -Reports hypoglycemic symptoms. Denies any < 70 since reducing his Humulin dose. Denies hyperglycemic symptoms. -Insulin cost affordable, but high. Purchases OTC from [facility]. -Educated onSick day management: glucometer readings QID until well and stable, maintain hydration with high fluid intake and call MD if glucose above 400 -Counseled to check feet daily and get yearly eye exams  -Feet checked by Donald [name] every 3 months  Discussed benefits of GLP-1 agonist on weight loss/CV risk. Patient will discuss with endo at next visit. -Recommended to continue current medication   Patient Goals/Self-Care Activities . Over the next 30  days, patient will:  - take medications as prescribed check glucose 3-4 times daily, document, and  provide at future appointments check blood pressure twice weekly, document, and provide at future appointments  Medication Assistance: Application for Novolin and Humulin  medication  assistance program. in process.  Anticipated assistance start date 03/28/20.  See plan of care for additional detail.  Mail  PAPs for insulin. Send provider portion to Donald [name].      The patient verbalized understanding of instructions, educational materials, and care plan provided today and declined offer to receive copy of patient instructions, educational materials, and care plan.  The pharmacy team will reach out to the patient again over the next 30 days.   Debbora Dus, [facility]

## 2020-03-14 NOTE — Progress Notes (Signed)
Encounter details: CCM Time Spent       Value Time User   Time spent with patient (minutes)  50 03/13/2020 11:06 PM Debbora Dus, [facility]   Time spent performing Chart review  30 03/13/2020 11:06 PM Debbora Dus, [facility]   Total time (minutes)  80 03/13/2020 11:06 PM Debbora Dus, RPH      Moderate to High Complex Decision Making       Value Time User   Moderate to High complex decision making  Yes 03/13/2020 11:04 PM Debbora Dus, [facility]      CCM Services: This encounter meets complex CCM services and moderate to high decision making.  I have personally reviewed this encounter including the documentation in this note and have collaborated with the care management provider regarding care management and care coordination activities to include development and update of the comprehensive care plan. I am certifying that I agree with the content of this note and encounter as supervising physician.

## 2020-03-17 ENCOUNTER — Encounter: Payer: Self-pay | Admitting: Family Medicine

## 2020-03-17 ENCOUNTER — Ambulatory Visit (INDEPENDENT_AMBULATORY_CARE_PROVIDER_SITE_OTHER): Payer: Medicare Other | Admitting: Family Medicine

## 2020-03-17 ENCOUNTER — Other Ambulatory Visit: Payer: Self-pay

## 2020-03-17 ENCOUNTER — Telehealth: Payer: Self-pay | Admitting: Family Medicine

## 2020-03-17 VITALS — BP 124/60 | HR 53 | Temp 97.7°F | Ht 64.0 in | Wt 273.2 lb

## 2020-03-17 DIAGNOSIS — E1142 Type 2 diabetes mellitus with diabetic polyneuropathy: Secondary | ICD-10-CM | POA: Diagnosis not present

## 2020-03-17 DIAGNOSIS — E538 Deficiency of other specified B group vitamins: Secondary | ICD-10-CM

## 2020-03-17 DIAGNOSIS — E1165 Type 2 diabetes mellitus with hyperglycemia: Secondary | ICD-10-CM

## 2020-03-17 DIAGNOSIS — H6121 Impacted cerumen, right ear: Secondary | ICD-10-CM

## 2020-03-17 DIAGNOSIS — D510 Vitamin B12 deficiency anemia due to intrinsic factor deficiency: Secondary | ICD-10-CM | POA: Diagnosis not present

## 2020-03-17 DIAGNOSIS — U099 Post covid-19 condition, unspecified: Secondary | ICD-10-CM

## 2020-03-17 DIAGNOSIS — R251 Tremor, unspecified: Secondary | ICD-10-CM | POA: Diagnosis not present

## 2020-03-17 DIAGNOSIS — E559 Vitamin D deficiency, unspecified: Secondary | ICD-10-CM

## 2020-03-17 DIAGNOSIS — R5382 Chronic fatigue, unspecified: Secondary | ICD-10-CM | POA: Diagnosis not present

## 2020-03-17 DIAGNOSIS — IMO0002 Reserved for concepts with insufficient information to code with codable children: Secondary | ICD-10-CM

## 2020-03-17 DIAGNOSIS — G9332 Myalgic encephalomyelitis/chronic fatigue syndrome: Secondary | ICD-10-CM

## 2020-03-17 MED ORDER — CYANOCOBALAMIN 1000 MCG/ML IJ SOLN
1000.0000 ug | Freq: Once | INTRAMUSCULAR | Status: AC
  Administered 2020-03-17: 1000 ug via INTRAMUSCULAR

## 2020-03-17 MED ORDER — VITAMIN D3 25 MCG (1000 UT) PO CAPS: 1.0000 | ORAL_CAPSULE | Freq: Every day | ORAL | Status: AC

## 2020-03-17 MED ORDER — CYANOCOBALAMIN 1000 MCG/ML IJ SOLN: 1000.0000 ug | INTRAMUSCULAR | 11 refills | Status: DC

## 2020-03-17 NOTE — Telephone Encounter (Signed)
I received the previous 4+ faxes from this company. I don't recommend this test at this time, he has f/u appt with cardiology next month, may discuss this with them if pt desires to complete testing.

## 2020-03-17 NOTE — Patient Instructions (Addendum)
Ok to start b12 shots at home by your son.  Start vitamin D 1000 units daily as your levels were low.  Right ear irrigation today.  Return in 2-3 months for wellness visit.  Possible essential tremor. We will monitor for now.

## 2020-03-17 NOTE — Telephone Encounter (Signed)
Carlo called in and wanted to know abou the form for  Cardiac genetic test they faxed the paperwork on 3/2

## 2020-03-17 NOTE — Telephone Encounter (Signed)
Dr. Darnell Level, have you seen this form?

## 2020-03-17 NOTE — Progress Notes (Signed)
Patient ID: Donald [name], male    DOB: [birth year], 75 y.o.   MRN: [id number]  This visit was conducted in person.  BP 124/60   Pulse (!) 53   Temp 97.7 F (36.5 C) (Temporal)   Ht 5\' 4"  (1.626 m)   Wt 273 lb 4 oz (123.9 kg)   SpO2 95%   BMI 46.90 kg/m    CC: med refill visit  Subjective:   HPI: JOSEPHMICHAEL [name] is a 75 y.o. male presenting on 03/17/2020 for Follow-up and Medication Refill (Requesting vit B12 rx to be given at home by son. )   Suffered COVID infection 01/2020. Having long hauler symptoms of significant fatigue. Also has noticed increased hand tremor most noticeable when eating with utensils. Tremor can spread throughout whole body. Denies chest pain/tightness, dyspnea or ongoing cough, no brain fog.   Pernicious anemia - with remaining low b12 levels despite monthly shot - last visit (04/2019) we added oral b12 to her regimen. Latest readings improved on IM + PO regimen. Requests B12 shots to be done by son at home - this is reasonable. Son in medical field already gives pt's wife b12 shots.   DM followed by endo Dr Loanne Drilling. Has had some low overnight sugars to 60s. Has dropped Novolin N to 200u nightly, continues Novolin R 150u + sliding scale. Aware of his hypoglycemic symptoms.  Lab Results  Component Value Date   HGBA1C 7.8 (H) 03/09/2020   Has seen Dr Marval Regal for CKD stage 4.  Known CHF (HFpEF) - last saw cards 09/2018. Has upcoming appt with Dr Rockey Situ later this month.   R ear stopped up - has been unable to get in to ENT ([facility]) until next Friday.   Care Team: Cardiology - Dr Rockey Situ Endocrinology - Dr Loanne Drilling Nephrology - Dr [facility] Urology - Dr Diona Fanti PM&R - Dr Nelva Bush ENT - Dr Pryor Ochoa Podiatry - Dr Wynona Luna - Dr Evorn Gong      Relevant past medical, surgical, family and social history reviewed and updated as indicated. Interim medical history since our last visit reviewed. Allergies and medications reviewed and updated. Outpatient  Medications Prior to Visit  Medication Sig Dispense Refill  . albuterol (VENTOLIN HFA) 108 (90 Base) MCG/ACT inhaler Inhale 2 puffs into the lungs every 6 (six) hours as needed for wheezing or shortness of breath. 8 g 0  . allopurinol (ZYLOPRIM) 300 MG tablet TAKE ONE TABLET EVERY DAY 30 tablet 10  . ALPRAZolam (XANAX) 1 MG tablet TAKE ONE TABLET AT BEDTIME IF NEEDED FORSLEEP 30 tablet 0  . amLODipine (NORVASC) 10 MG tablet Take 1 tablet (10 mg total) by mouth daily. 30 tablet 1  . aspirin EC 81 MG tablet Take 1 tablet (81 mg total) by mouth daily.    . colchicine (COLCRYS) 0.6 MG tablet Take 1 tablet (0.6 mg total) by mouth daily as needed (gout flare). On first day of gout flare may take 2 tablets at once. 30 tablet 4  . Cyanocobalamin (B-12) 1000 MCG SUBL Place 1 tablet under the tongue daily.    . fluticasone (FLONASE) 50 MCG/ACT nasal spray Place 2 sprays into both nostrils daily. 16 g 1  . furosemide (LASIX) 80 MG tablet TAKE ONE TABLET (80 MG) BY MOUTH EVERY DAY 90 tablet 1  . gabapentin (NEURONTIN) 600 MG tablet TAKE TWO TABLETS AT BEDTIME 180 tablet 3  . glucose blood (GE100 BLOOD GLUCOSE TEST) test strip 1 each by Other route 4 (four) times  daily. E11.9 360 each 0  . HYDROcodone-acetaminophen (NORCO) 10-325 MG tablet Take 1 tablet by mouth every 6 (six) hours as needed for moderate pain ((typically once to twice daily if needed)).  30 tablet 0  . insulin NPH Human (HUMULIN N) 100 UNIT/ML injection Inject 2.9 mLs (290 Units total) into the skin at bedtime. 90 mL 5  . insulin regular (NOVOLIN R RELION) 100 units/mL injection 3 times a day (just before each meal), 210-150-290 units, and syringes 4/day. 210 mL 0  . losartan (COZAAR) 25 MG tablet TAKE ONE TABLET AT BEDTIME 90 tablet 3  . lovastatin (MEVACOR) 40 MG tablet TAKE TWO TABLETS BY MOUTH AT BEDTIME 180 tablet 3  . Naphazoline-Pheniramine (OPCON-A OP) Place 1 drop into both eyes 3 (three) times daily as needed (itchy eyes.).     Marland Kitchen  ondansetron (ZOFRAN) 4 MG tablet Take 1 tablet (4 mg total) by mouth every 4 (four) hours as needed for nausea. 10 tablet 0  . Potassium Citrate 15 MEQ (1620 MG) TBCR Take 1 tablet by mouth in the morning, at noon, in the evening, and at bedtime.    [name] Sodium (STOOL SOFTENER & LAXATIVE PO) Take 2 tablets by mouth at bedtime.    . cyanocobalamin (,VITAMIN B-12,) 1000 MCG/ML injection Inject 1,000 mcg into the muscle every 30 (thirty) days.    Marland Kitchen HYDROcodone-homatropine (HYCODAN) 5-1.5 MG/5ML syrup Take 5 mLs by mouth 2 (two) times daily as needed for cough (sedation precautions). 120 mL 0   No facility-administered medications prior to visit.     Per HPI unless specifically indicated in ROS section below Review of Systems Objective:  BP 124/60   Pulse (!) 53   Temp 97.7 F (36.5 C) (Temporal)   Ht 5\' 4"  (1.626 m)   Wt 273 lb 4 oz (123.9 kg)   SpO2 95%   BMI 46.90 kg/m   Wt Readings from Last 3 Encounters:  03/17/20 273 lb 4 oz (123.9 kg)  10/07/19 273 lb (123.8 kg)  06/18/19 278 lb (126.1 kg)      Physical Exam Vitals and nursing note reviewed.  Constitutional:      Appearance: Normal appearance. He is obese. He is not ill-appearing.  HENT:     Head: Normocephalic and atraumatic.     Right Ear: There is impacted cerumen.     Left Ear: Tympanic membrane, ear canal and external ear normal.  Cardiovascular:     Rate and Rhythm: Normal rate and regular rhythm.     Pulses: Normal pulses.     Heart sounds: Normal heart sounds. No murmur heard.   Pulmonary:     Effort: Pulmonary effort is normal. No respiratory distress.     Breath sounds: Normal breath sounds. No wheezing, rhonchi or rales.  Musculoskeletal:     Right lower leg: Edema (tr) present.     Left lower leg: Edema (tr) present.  Neurological:     Mental Status: He is alert.       Results for orders placed or performed in visit on 03/09/20  VITAMIN D 25 Hydroxy (Vit-D Deficiency, Fractures)   Result Value Ref Range   VITD 21.33 (L) 30.00 - 100.00 ng/mL  Vitamin B12  Result Value Ref Range   Vitamin B-12 814 211 - 911 pg/mL  TSH  Result Value Ref Range   TSH 3.01 0.35 - 4.50 uIU/mL  Basic metabolic panel  Result Value Ref Range   Sodium 145 135 - 145 mEq/L  Potassium 4.6 3.5 - 5.1 mEq/L   Chloride 102 96 - 112 mEq/L   CO2 34 (H) 19 - 32 mEq/L   Glucose, Bld 76 70 - 99 mg/dL   BUN 26 (H) 6 - 23 mg/dL   Creatinine, Ser 1.74 (H) 0.40 - 1.50 mg/dL   GFR 38.77 (L) >60.00 mL/min   Calcium 9.4 8.4 - 10.5 mg/dL  CBC with Differential/Platelet  Result Value Ref Range   WBC 6.8 4.0 - 10.5 K/uL   RBC 4.62 4.22 - 5.81 Mil/uL   Hemoglobin 13.5 13.0 - 17.0 g/dL   HCT 41.3 39.0 - 52.0 %   MCV 89.4 78.0 - 100.0 fl   MCHC 32.8 30.0 - 36.0 g/dL   RDW 16.8 (H) 11.5 - 15.5 %   Platelets 198.0 150.0 - 400.0 K/uL   Neutrophils Relative % 64.0 43.0 - 77.0 %   Lymphocytes Relative 23.4 12.0 - 46.0 %   Monocytes Relative 7.2 3.0 - 12.0 %   Eosinophils Relative 4.2 0.0 - 5.0 %   Basophils Relative 1.2 0.0 - 3.0 %   Neutro Abs 4.3 1.4 - 7.7 K/uL   Lymphs Abs 1.6 0.7 - 4.0 K/uL   Monocytes Absolute 0.5 0.1 - 1.0 K/uL   Eosinophils Absolute 0.3 0.0 - 0.7 K/uL   Basophils Absolute 0.1 0.0 - 0.1 K/uL  Hemoglobin A1c  Result Value Ref Range   Hgb A1c MFr Bld 7.8 (H) 4.6 - 6.5 %   Assessment & Plan:  This visit occurred during the SARS-CoV-2 public health emergency.  Safety protocols were in place, including screening questions prior to the visit, additional usage of staff PPE, and extensive cleaning of exam room while observing appropriate contact time as indicated for disinfecting solutions.   Problem List Items Addressed This Visit    Vitamin D deficiency    rec start 1000 IU daily.       Vitamin B12 deficiency    Reasonable to have son start administering injections at home. Will send in b12 mg vials and syringes.       Uncontrolled type 2 diabetes mellitus with peripheral  neuropathy (HCC)    Chronic, stable on insulin followed by endocrinology. He titrates regimen to avoid hypoglycemia.       Tremor    Anticipate essential tremor, no parkinsonism symptoms. Suggested trial of weighted utensils  Avoid further medication at this time.       Post-COVID-19 syndrome manifesting as chronic fatigue - Primary    Ongoing. Recent labwork reassuring except for low vit D - rec start 1000 IU daily. Discussed post covid fatigue and long hauler symptoms. Anticipate slow improvement over time. He does also have cardiology f/u planned soon.       Pernicious anemia    Continue IM + PO replacement regimen.       Relevant Medications   cyanocobalamin (,VITAMIN B-12,) 1000 MCG/ML injection   Hearing loss of right ear due to cerumen impaction    Irrigation performed unsuccessfully. He will f/u with ENT next week.           Meds ordered this encounter  Medications  . cyanocobalamin ((VITAMIN B-12)) injection 1,000 mcg  . cyanocobalamin (,VITAMIN B-12,) 1000 MCG/ML injection    Sig: Inject 1 mL (1,000 mcg total) into the muscle every 30 (thirty) days.    Dispense:  1 mL    Refill:  11  . Cholecalciferol (VITAMIN D3) 25 MCG (1000 UT) CAPS    Sig: Take 1 capsule (  1,000 Units total) by mouth daily.    Dispense:  30 capsule  . Syringe/Needle, Disp, (SYRINGE 3CC/22GX1-1/2") 22G X 1-1/2" 3 ML MISC    Sig: Use to administer monthly b12 shots    Dispense:  50 each    Refill:  0   No orders of the defined types were placed in this encounter.  Patient Instructions  Ok to start b12 shots at home by your son.  Start vitamin D 1000 units daily as your levels were low.  Right ear irrigation today.  Return in 2-3 months for wellness visit.  Possible essential tremor. We will monitor for now.     Follow up plan: Return in about 3 months (around 06/17/2020) for medicare wellness visit, follow up visit.  Ria Bush, MD

## 2020-03-18 DIAGNOSIS — H6121 Impacted cerumen, right ear: Secondary | ICD-10-CM | POA: Insufficient documentation

## 2020-03-18 DIAGNOSIS — R251 Tremor, unspecified: Secondary | ICD-10-CM | POA: Insufficient documentation

## 2020-03-18 MED ORDER — "SYRINGE 22G X 1-1/2"" 3 ML MISC": 0 refills | Status: AC

## 2020-03-18 NOTE — Assessment & Plan Note (Signed)
Continue IM + PO replacement regimen.

## 2020-03-18 NOTE — Assessment & Plan Note (Addendum)
Irrigation performed unsuccessfully. He will f/u with ENT next week.

## 2020-03-18 NOTE — Assessment & Plan Note (Signed)
Chronic, stable on insulin followed by endocrinology. He titrates regimen to avoid hypoglycemia.

## 2020-03-18 NOTE — Assessment & Plan Note (Addendum)
Ongoing. Recent labwork reassuring except for low vit D - rec start 1000 IU daily. Discussed post covid fatigue and long hauler symptoms. Anticipate slow improvement over time. He does also have cardiology f/u planned soon.

## 2020-03-18 NOTE — Assessment & Plan Note (Signed)
Anticipate essential tremor, no parkinsonism symptoms. Suggested trial of weighted utensils  Avoid further medication at this time.

## 2020-03-18 NOTE — Assessment & Plan Note (Signed)
rec start 1000 IU daily.  

## 2020-03-18 NOTE — Assessment & Plan Note (Signed)
Reasonable to have son start administering injections at home. Will send in b12 mg vials and syringes.

## 2020-03-20 DIAGNOSIS — M5416 Radiculopathy, lumbar region: Secondary | ICD-10-CM | POA: Diagnosis not present

## 2020-03-20 NOTE — Telephone Encounter (Signed)
Noted  

## 2020-03-21 ENCOUNTER — Telehealth: Payer: Self-pay

## 2020-03-21 NOTE — Chronic Care Management (AMB) (Signed)
° ° °  Chronic Care Management Pharmacy Assistant   Name: Donald [name]  MRN: [id number] DOB: 1945-10-20  Reason for Encounter: Patient Assistance- Humulin N, Novolin R   Conditions to be addressed/monitored: DMII   Medications: Outpatient Encounter Medications as of 03/21/2020  Medication Sig   albuterol (VENTOLIN HFA) 108 (90 Base) MCG/ACT inhaler Inhale 2 puffs into the lungs every 6 (six) hours as needed for wheezing or shortness of breath.   allopurinol (ZYLOPRIM) 300 MG tablet TAKE ONE TABLET EVERY DAY   ALPRAZolam (XANAX) 1 MG tablet TAKE ONE TABLET AT BEDTIME IF NEEDED FORSLEEP   amLODipine (NORVASC) 10 MG tablet Take 1 tablet (10 mg total) by mouth daily.   aspirin EC 81 MG tablet Take 1 tablet (81 mg total) by mouth daily.   Cholecalciferol (VITAMIN D3) 25 MCG (1000 UT) CAPS Take 1 capsule (1,000 Units total) by mouth daily.   colchicine (COLCRYS) 0.6 MG tablet Take 1 tablet (0.6 mg total) by mouth daily as needed (gout flare). On first day of gout flare may take 2 tablets at once.   cyanocobalamin (,VITAMIN B-12,) 1000 MCG/ML injection Inject 1 mL (1,000 mcg total) into the muscle every 30 (thirty) days.   Cyanocobalamin (B-12) 1000 MCG SUBL Place 1 tablet under the tongue daily.   fluticasone (FLONASE) 50 MCG/ACT nasal spray Place 2 sprays into both nostrils daily.   furosemide (LASIX) 80 MG tablet TAKE ONE TABLET (80 MG) BY MOUTH EVERY DAY   gabapentin (NEURONTIN) 600 MG tablet TAKE TWO TABLETS AT BEDTIME   glucose blood (GE100 BLOOD GLUCOSE TEST) test strip 1 each by Other route 4 (four) times daily. E11.9   HYDROcodone-acetaminophen (NORCO) 10-325 MG tablet Take 1 tablet by mouth every 6 (six) hours as needed for moderate pain ((typically once to twice daily if needed)).    insulin NPH Human (HUMULIN N) 100 UNIT/ML injection Inject 2.9 mLs (290 Units total) into the skin at bedtime.   insulin regular (NOVOLIN R RELION) 100 units/mL injection 3 times a day  (just before each meal), 210-150-290 units, and syringes 4/day.   losartan (COZAAR) 25 MG tablet TAKE ONE TABLET AT BEDTIME   lovastatin (MEVACOR) 40 MG tablet TAKE TWO TABLETS BY MOUTH AT BEDTIME   Naphazoline-Pheniramine (OPCON-A OP) Place 1 drop into both eyes 3 (three) times daily as needed (itchy eyes.).    ondansetron (ZOFRAN) 4 MG tablet Take 1 tablet (4 mg total) by mouth every 4 (four) hours as needed for nausea.   Potassium Citrate 15 MEQ (1620 MG) TBCR Take 1 tablet by mouth in the morning, at noon, in the evening, and at bedtime.   Sennosides-Docusate Sodium (STOOL SOFTENER & LAXATIVE PO) Take 2 tablets by mouth at bedtime.   Syringe/Needle, Disp, (SYRINGE 3CC/22GX1-1/2") 22G X 1-1/2" 3 ML MISC Use to administer monthly b12 shots   No facility-administered encounter medications on file as of 03/21/2020.    Completed patient assistance applications for Novolin R and Humulin N on behalf of the patient. Applications will be mailed to patient per his request. He is aware he will need to complete his portion and then take forms with him to Dr. Loanne Drilling at his appointment 04/12/20 to have him sign. Will follow up in April with patient regarding approval status.   Follow-Up:  Patient Assistance Coordination and Pharmacist Review  Debbora Dus, CPP notified  Margaretmary Dys, [place] [phone]  Total time spent for month: 34

## 2020-03-23 DIAGNOSIS — M5416 Radiculopathy, lumbar region: Secondary | ICD-10-CM | POA: Diagnosis not present

## 2020-03-24 ENCOUNTER — Other Ambulatory Visit: Payer: Self-pay | Admitting: Otolaryngology

## 2020-03-24 DIAGNOSIS — H903 Sensorineural hearing loss, bilateral: Secondary | ICD-10-CM | POA: Diagnosis not present

## 2020-03-24 DIAGNOSIS — R221 Localized swelling, mass and lump, neck: Secondary | ICD-10-CM

## 2020-03-24 DIAGNOSIS — H6063 Unspecified chronic otitis externa, bilateral: Secondary | ICD-10-CM | POA: Diagnosis not present

## 2020-03-24 DIAGNOSIS — H6123 Impacted cerumen, bilateral: Secondary | ICD-10-CM | POA: Diagnosis not present

## 2020-03-30 DIAGNOSIS — N1831 Chronic kidney disease, stage 3a: Secondary | ICD-10-CM | POA: Diagnosis not present

## 2020-03-31 ENCOUNTER — Other Ambulatory Visit: Payer: Self-pay | Admitting: Family Medicine

## 2020-03-31 NOTE — Telephone Encounter (Signed)
Name of Medication: Alprazolam Name of Pharmacy: [facility] or Written Date and Quantity: 01/14/20, #30 Last Office Visit and Type: 03/17/20, post COVID fatigue Next Office Visit and Type: 06/21/20, AWV prt 2 Last Controlled Substance Agreement Date: none Last UDS: none

## 2020-04-03 NOTE — Telephone Encounter (Signed)
ERx 

## 2020-04-04 DIAGNOSIS — G4733 Obstructive sleep apnea (adult) (pediatric): Secondary | ICD-10-CM | POA: Diagnosis not present

## 2020-04-04 DIAGNOSIS — E1122 Type 2 diabetes mellitus with diabetic chronic kidney disease: Secondary | ICD-10-CM | POA: Diagnosis not present

## 2020-04-04 DIAGNOSIS — N179 Acute kidney failure, unspecified: Secondary | ICD-10-CM | POA: Diagnosis not present

## 2020-04-04 DIAGNOSIS — N1831 Chronic kidney disease, stage 3a: Secondary | ICD-10-CM | POA: Diagnosis not present

## 2020-04-04 DIAGNOSIS — I129 Hypertensive chronic kidney disease with stage 1 through stage 4 chronic kidney disease, or unspecified chronic kidney disease: Secondary | ICD-10-CM | POA: Diagnosis not present

## 2020-04-04 DIAGNOSIS — E870 Hyperosmolality and hypernatremia: Secondary | ICD-10-CM | POA: Diagnosis not present

## 2020-04-04 DIAGNOSIS — Z9989 Dependence on other enabling machines and devices: Secondary | ICD-10-CM | POA: Diagnosis not present

## 2020-04-04 DIAGNOSIS — M109 Gout, unspecified: Secondary | ICD-10-CM | POA: Diagnosis not present

## 2020-04-04 DIAGNOSIS — D631 Anemia in chronic kidney disease: Secondary | ICD-10-CM | POA: Diagnosis not present

## 2020-04-04 DIAGNOSIS — D351 Benign neoplasm of parathyroid gland: Secondary | ICD-10-CM | POA: Diagnosis not present

## 2020-04-04 DIAGNOSIS — N200 Calculus of kidney: Secondary | ICD-10-CM | POA: Diagnosis not present

## 2020-04-04 DIAGNOSIS — N2581 Secondary hyperparathyroidism of renal origin: Secondary | ICD-10-CM | POA: Diagnosis not present

## 2020-04-05 DIAGNOSIS — N200 Calculus of kidney: Secondary | ICD-10-CM | POA: Diagnosis not present

## 2020-04-12 ENCOUNTER — Ambulatory Visit: Payer: Medicare Other | Admitting: Endocrinology

## 2020-04-13 ENCOUNTER — Ambulatory Visit: Payer: Medicare Other

## 2020-04-20 NOTE — Progress Notes (Signed)
Date:  04/21/2020   ID:  [name], DOB 11/13/1945, MRN [id number]  Patient Location:  [place] [address]   Provider location:   [facility], [place] office  PCP:  Ria Bush, MD  Cardiologist:  Arvid Right [facility]   Chief Complaint  Patient presents with  . 12 month follow up     Patient c/o LE edema & shortness of breath. Medications reviewed by the patient verbally.     History of Present Illness:    Donald [name] is a 75 y.o. male  past medical history of Long-standing diabetes, HBA1C 7.7 hyperlipidemia,  coronary artery disease,  Prior cardiac catheterization 2012 with 40% coronary disease noted Diffuse three-vessel coronary calcification on CT scan PAD,   chronic kidney disease, hypercalcemia,   OSA on CPAP,  Surgery of his parathyroid Chronic back pain Who presents for follow-up of his coronary artery disease  LOV with one of our providers on 09/2018 On today's visit reports he is very sedentary, deconditioned leg swelling, weight up, at least 5 pounds Sitting a lot, drinking a lot, legs down Diet sodas  Has kidney stone, reports he is followed by urology Takes lasix daily Has not been taking any extra for weight gain or worsening leg swelling or abdominal distention  Lab work reviewed with him A1C 7.8,  Carotid u/s for bruit on left done in 2020, <39% stenosis  Chronic back pain On pain meds  Compliant with CPAP Walks with a cane, no falls  Lab work reviewed Total chol 103, LDL 37 Cr 1.65, BUN 30 Low B12, Low Vit D Normal CBC HBA1C: 8.3   cardiac risk factor Nonsmoker, cholesterol well controlled on statin, lovastatin  EKG personally reviewed by myself on todays visit NSR rate 64 bpm, PVC  Other past medical history reviewed Abnormal EKG leading to stress testing on prior clinic visit This showed no significant ischemia  Had parathyroid, surgery Now with vertigo  Following surgery Mild improvement with meclizine, though still symptomatic   previous PET/CT imaging   mild to moderate calcified plaque in the carotid arteries right greater than left Mild to moderate plaque in the aortic arch Diffuse three-vessel coronary calcifications He does have mild to moderate plaquing in the branches off the mid to distal descending aorta  st[place] left renal calculus  Recent parathyroid spectscan  showed a focal abnormal sestamibi location suspicious for right inferior parathyroid adenoma. Nonspecific tracer localization within single normal-sized left axillary and right subpectoral lymph node.   elective parathyroidectomy on 02/29/2016.   Prior CV studies:   The following studies were reviewed today:   Past Medical History:  Diagnosis Date  . (HFpEF) heart failure with preserved ejection fraction ([place])    a. 09/2018 Echo: EF 60-65%. PASP 51mmHg. Mild-mod LAE. Mild MR/TR.  Marland Kitchen ANEMIA-IRON DEFICIENCY 07/26/2006  . Anxiety   . ASTHMA 07/26/2006  . Asthma   . Back pain, chronic   . Bell's palsy   . CKD (chronic kidney disease), stage III ([place])   . COLONIC POLYPS, HX OF 07/26/2006  . COVID-19 virus infection 01/2020  . DEPRESSION 03/14/2009  . DIABETES MELLITUS, TYPE II 07/26/2006  . [place] DISEASE, LUMBAR 10/05/2007  . DVT 12/03/2007  . DYSLIPIDEMIA 04/13/2009  . Dyspnea    when gets up and walks around and back is hurting really bad -only Shortness of breath  then  . GERD 07/26/2006  . Gout   . Heart murmur   .  History of kidney stones   . HYPERTENSION 07/26/2006  . INSOMNIA 08/21/2007  . Neuropathy   . Nonobstructive CAD (coronary artery disease)    a. 2012 Cath: no high grade stenosis; b. 2018 MV: No ischemia. Attenuation artifact.   . OBSTRUCTIVE SLEEP APNEA 12/03/2007   Use C-PAP  . PERIPHERAL NEUROPATHY 07/26/2006  . Pernicious anemia 11/20/2006  . PULMONARY EMBOLISM 10/05/2007   Past Surgical History:  Procedure Laterality Date  . APPENDECTOMY   1968  . [place], 06/14/2008   Dr[facility] Trenton [facility]mon at [facility] (06/10)  . CARDIAC CATHETERIZATION    . CARPAL TUNNEL RELEASE Right 10/2017  . CATARACT EXTRACTION W/PHACO Left 05/30/2014   Procedure: CATARACT EXTRACTION PHACO AND INTRAOCULAR LENS PLACEMENT (IOC);  Surgeon: Estill Cotta, MD;  Location: [facility] ORS;  Service: Ophthalmology;  Laterality: Left;  [name] 01:20 AP% 23.7 CDE 31.86  . COLONOSCOPY  10/2002   HP, SSA, TA, rpt 3 yrs ([place])  . COLONOSCOPY  01/2005   diverticulosis rpt 5 yrs ([place])   . IR NEPHROSTOMY PLACEMENT LEFT  10/19/2018  . IR URETERAL STENT LEFT NEW ACCESS W/O SEP NEPHROSTOMY CATH  10/19/2018  . LITHOTRIPSY     X 2  . NEPHROLITHOTOMY Left 10/19/2018   Procedure: NEPHROLITHOTOMY PERCUTANEOUS;  Surgeon: Franchot Gallo, MD;  Location: [facility] ORS;  Service: Urology;  Laterality: Left;  3 HRS  . PARATHYROIDECTOMY Right 04/18/2016   PARATHYROIDECTOMY for cyst [name], MD)  . SHOULDER ARTHROSCOPY W/ ROTATOR CUFF REPAIR Right   . TONSILLECTOMY       Current Meds  Medication Sig  . albuterol (VENTOLIN HFA) 108 (90 Base) MCG/ACT inhaler Inhale 2 puffs into the lungs every 6 (six) hours as needed for wheezing or shortness of breath.  . allopurinol (ZYLOPRIM) 300 MG tablet TAKE ONE TABLET EVERY DAY  . ALPRAZolam (XANAX) 1 MG tablet TAKE ONE TABLET AT BEDTIME IF NEEDED FORSLEEP  . amLODipine (NORVASC) 10 MG tablet Take 1 tablet (10 mg total) by mouth daily.  Marland Kitchen aspirin EC 81 MG tablet Take 1 tablet (81 mg total) by mouth daily.  . Cholecalciferol (VITAMIN D3) 25 MCG (1000 UT) CAPS Take 1 capsule (1,000 Units total) by mouth daily.  . colchicine (COLCRYS) 0.6 MG tablet Take 1 tablet (0.6 mg total) by mouth daily as needed (gout flare). On first day of gout flare may take 2 tablets at once.  . cyanocobalamin (,VITAMIN B-12,) 1000 MCG/ML injection Inject 1 mL (1,000 mcg total) into the muscle every 30 (thirty) days.  . Cyanocobalamin (B-12) 1000 MCG SUBL Place  1 tablet under the tongue daily.  . fluticasone (FLONASE) 50 MCG/ACT nasal spray Place 2 sprays into both nostrils daily.  . furosemide (LASIX) 80 MG tablet TAKE ONE TABLET (80 MG) BY MOUTH EVERY DAY  . gabapentin (NEURONTIN) 600 MG tablet TAKE TWO TABLETS AT BEDTIME  . glucose blood (GE100 BLOOD GLUCOSE TEST) test strip 1 each by Other route 4 (four) times daily. E11.9  . HYDROcodone-acetaminophen (NORCO) 10-325 MG tablet Take 1 tablet by mouth every 6 (six) hours as needed for moderate pain ((typically once to twice daily if needed)).   Marland Kitchen insulin NPH Human (HUMULIN N) 100 UNIT/ML injection Inject 2.9 mLs (290 Units total) into the skin at bedtime.  . insulin regular (NOVOLIN R RELION) 100 units/mL injection 3 times a day (just before each meal), 210-150-290 units, and syringes 4/day.  . losartan (COZAAR) 25 MG tablet TAKE ONE TABLET AT BEDTIME  . lovastatin (MEVACOR) 40  MG tablet TAKE TWO TABLETS BY MOUTH AT BEDTIME  . Naphazoline-Pheniramine (OPCON-A OP) Place 1 drop into both eyes 3 (three) times daily as needed (itchy eyes.).   Marland Kitchen ondansetron (ZOFRAN) 4 MG tablet Take 1 tablet (4 mg total) by mouth every 4 (four) hours as needed for nausea.  [name] Sodium (STOOL SOFTENER & LAXATIVE PO) Take 2 tablets by mouth at bedtime.  . Syringe/Needle, Disp, (SYRINGE 3CC/22GX1-1/2") 22G X 1-1/2" 3 ML MISC Use to administer monthly b12 shots     Allergies:   Codeine and Pioglitazone   Social History   Tobacco Use  . Smoking status: Never Smoker  . Smokeless tobacco: Never Used  Vaping Use  . Vaping Use: Never used  Substance Use Topics  . Alcohol use: No  . Drug use: No      Family Hx: The patient's family history includes Cancer in his mother and sister; Diabetes in his father and paternal grandfather; Heart disease in his father.  ROS:   Please see the history of present illness.    Review of Systems  Constitutional: Negative.   Respiratory: Negative.   Cardiovascular:  Positive for leg swelling.  Gastrointestinal: Negative.   Musculoskeletal: Positive for back pain.  Neurological: Negative.   Psychiatric/Behavioral: Negative.   All other systems reviewed and are negative.    Labs/Other Tests and Data Reviewed:    Recent Labs: 03/09/2020: BUN 26; Creatinine, Ser 1.74; Hemoglobin 13.5; Platelets 198.0; Potassium 4.6; Sodium 145; TSH 3.01   Recent Lipid Panel Lab Results  Component Value Date/Time   CHOL 113 04/20/2019 10:26 AM   TRIG 112.0 04/20/2019 10:26 AM   HDL 32.00 (L) 04/20/2019 10:26 AM   CHOLHDL 4 04/20/2019 10:26 AM   LDLCALC 59 04/20/2019 10:26 AM   LDLDIRECT 52.2 05/25/2009 09:37 AM    Wt Readings from Last 3 Encounters:  04/21/20 278 lb 4 oz (126.2 kg)  03/17/20 273 lb 4 oz (123.9 kg)  10/07/19 273 lb (123.8 kg)     Exam:    Vital Signs: Vital signs may also be detailed in the HPI BP (!) 110/50 (BP Location: Left Arm, Patient Position: Sitting, Cuff Size: Large)   Pulse 64   Ht 5\' 6"  (1.676 m)   Wt 278 lb 4 oz (126.2 kg)   SpO2 98%   BMI 44.91 kg/m   Constitutional:  oriented to person, place, and time. No distress.  HENT:  Head: Grossly normal Eyes:  no discharge. No scleral icterus.  Neck: No JVD, no carotid bruits  Cardiovascular: Regular rate and rhythm, no murmurs appreciated Pulmonary/Chest: Clear to auscultation bilaterally, no wheezes or rails Abdominal: Soft.  no distension.  no tenderness.  Musculoskeletal: Normal range of motion Neurological:  normal muscle tone. Coordination normal. No atrophy Skin: Skin warm and dry Psychiatric: normal affect, pleasant  ASSESSMENT & PLAN:    Essential hypertension -  In light of significant leg swelling/edema, recommend he stop amlodipine We were going to just watch his blood pressure and have him call us back, he prefers to start a alternate blood pressure medication in replacement of his amlodipine Start cardura 1 mg BID He will call with BP measurement in next 1-2   If it runs low will decrease down to 1 mg daily  Dyslipidemia -  Cholesterol is at goal on the current lipid regimen. No changes to the medications were made.  Coronary artery disease involving native coronary artery of native heart with angina pectoris ([place]) Significant three-vessel coronary calcification  seen on previous CT scan stress Myoview with no significant ischemia Prior cardiac catheterization in 2012 with known coronary disease mild in the left main, 40% in other vessels  Stressed importance of aggressive diabetes control  Aortic atherosclerosis (HCC) Seen in the aortic arch and branches of the descending aorta Hemoglobin A1c continues to run high LDL at goal  Morbid obesity ([place]) Stressed importance of low carbohydrate diet Unable to exercise secondary to chronic back and orthopedic issues  Type 2 diabetes mellitus without complication, without long-term current use of insulin (HCC) Hemoglobin A1c of 7.8 We have encouraged continued  careful diet management in an effort to lose weight. Unable to exercise very well secondary to chronic pain, morbid obesity  Lower extremity edema Taking Lasix daily, likely exacerbating his chronic renal insufficiency Stop amlodipine sx, leg swelling worse in past 8 months    Total encounter time more than 25 minutes  Greater than 50% was spent in counseling and coordination of care with the patient   Signed, Ida Rogue, MD  04/21/2020 10:09 AM    [place] Office [address], [place], [place] [address]

## 2020-04-21 ENCOUNTER — Other Ambulatory Visit: Payer: Self-pay

## 2020-04-21 ENCOUNTER — Ambulatory Visit (INDEPENDENT_AMBULATORY_CARE_PROVIDER_SITE_OTHER): Payer: Medicare Other | Admitting: Cardiovascular Disease

## 2020-04-21 ENCOUNTER — Encounter: Payer: Self-pay | Admitting: Cardiovascular Disease

## 2020-04-21 VITALS — BP 110/50 | HR 64 | Ht 66.0 in | Wt 278.2 lb

## 2020-04-21 DIAGNOSIS — N183 Chronic kidney disease, stage 3 unspecified: Secondary | ICD-10-CM | POA: Diagnosis not present

## 2020-04-21 DIAGNOSIS — I10 Essential (primary) hypertension: Secondary | ICD-10-CM | POA: Diagnosis not present

## 2020-04-21 DIAGNOSIS — I5032 Chronic diastolic (congestive) heart failure: Secondary | ICD-10-CM | POA: Diagnosis not present

## 2020-04-21 DIAGNOSIS — E782 Mixed hyperlipidemia: Secondary | ICD-10-CM

## 2020-04-21 DIAGNOSIS — I2510 Atherosclerotic heart disease of native coronary artery without angina pectoris: Secondary | ICD-10-CM | POA: Diagnosis not present

## 2020-04-21 MED ORDER — FUROSEMIDE 80 MG PO TABS: ORAL_TABLET | ORAL | 3 refills | Status: DC

## 2020-04-21 MED ORDER — DOXAZOSIN MESYLATE 1 MG PO TABS: 1.0000 mg | ORAL_TABLET | Freq: Two times a day (BID) | ORAL | 3 refills | Status: DC

## 2020-04-21 MED ORDER — LOVASTATIN 40 MG PO TABS: 80.0000 mg | ORAL_TABLET | Freq: Every day | ORAL | 3 refills | Status: DC

## 2020-04-21 MED ORDER — LOSARTAN POTASSIUM 25 MG PO TABS: 25.0000 mg | ORAL_TABLET | Freq: Every day | ORAL | 3 refills | Status: DC

## 2020-04-21 NOTE — Patient Instructions (Addendum)
Medication Instructions: Your cardiac medication have been refilled and sent into your pharmacy, you may pick up later today STOP amlodipine START cardura/dozaxosin 1 mg Take two times a day This is to replace the amlodipine TAKE AN EXTRA Lasix as needed 1-2 times a week for swelling/edema Lab work: No new labs needed Testing/Procedures: No new testing needed Follow-Up: You will need a follow up appointment in 6 months Providers on your designated Care Team: Murray Hodgkins, NP Christell Faith, PA-C Marrianne Mood, PA-C COVID-19 Vaccine Information can be found at: [email or web address] For questions related to vaccine distribution or appointments, please email [email or web address] or call [phone].

## 2020-04-24 ENCOUNTER — Other Ambulatory Visit: Payer: Self-pay | Admitting: Family Medicine

## 2020-04-27 ENCOUNTER — Telehealth: Payer: Self-pay

## 2020-04-27 NOTE — Chronic Care Management (AMB) (Addendum)
Chronic Care Management Pharmacy Assistant   Name: Donald [name]  MRN: [id number] DOB: [birth year]   Reason for Encounter: Disease State HTN   Conditions to be addressed/monitored: HTN  Recent office visits:  03/17/2020 Dr.Gutierrez  PCP - Start Cholecalciferol 1000 units daily, monthly B12 injections  Recent consult visits:  04/21/2020 [facility]  - Start Doxazosin 1 mg BID, STOP amlodipine. Take an extra Lasix 1-2 x per week for swelling 03/23/2020 [facility]  PM&R   Hospital visits:  None in previous 6 months  Medications: Outpatient Encounter Medications as of 04/27/2020  Medication Sig   albuterol (VENTOLIN HFA) 108 (90 Base) MCG/ACT inhaler Inhale 2 puffs into the lungs every 6 (six) hours as needed for wheezing or shortness of breath.   allopurinol (ZYLOPRIM) 300 MG tablet TAKE ONE TABLET EVERY DAY   ALPRAZolam (XANAX) 1 MG tablet TAKE ONE TABLET AT BEDTIME IF NEEDED FORSLEEP   aspirin EC 81 MG tablet Take 1 tablet (81 mg total) by mouth daily.   Cholecalciferol (VITAMIN D3) 25 MCG (1000 UT) CAPS Take 1 capsule (1,000 Units total) by mouth daily.   colchicine (COLCRYS) 0.6 MG tablet Take 1 tablet (0.6 mg total) by mouth daily as needed (gout flare). On first day of gout flare may take 2 tablets at once.   cyanocobalamin (,VITAMIN B-12,) 1000 MCG/ML injection Inject 1 mL (1,000 mcg total) into the muscle every 30 (thirty) days.   Cyanocobalamin (B-12) 1000 MCG SUBL Place 1 tablet under the tongue daily.   doxazosin (CARDURA) 1 MG tablet Take 1 tablet (1 mg total) by mouth 2 (two) times daily.   fluticasone (FLONASE) 50 MCG/ACT nasal spray Place 2 sprays into both nostrils daily.   furosemide (LASIX) 80 MG tablet TAKE ONE TABLET (80 MG) BY MOUTH EVERY DAY take an extra lasix for swelling 1-2 a week   gabapentin (NEURONTIN) 600 MG tablet TAKE TWO TABLETS AT BEDTIME   glucose blood (GE100 BLOOD GLUCOSE TEST) test strip 1 each by Other  route 4 (four) times daily. E11.9   HYDROcodone-acetaminophen (NORCO) 10-325 MG tablet Take 1 tablet by mouth every 6 (six) hours as needed for moderate pain ((typically once to twice daily if needed)).    insulin NPH Human (HUMULIN N) 100 UNIT/ML injection Inject 2.9 mLs (290 Units total) into the skin at bedtime.   insulin regular (NOVOLIN R RELION) 100 units/mL injection 3 times a day (just before each meal), 210-150-290 units, and syringes 4/day.   losartan (COZAAR) 25 MG tablet Take 1 tablet (25 mg total) by mouth at bedtime.   lovastatin (MEVACOR) 40 MG tablet Take 2 tablets (80 mg total) by mouth at bedtime.   Naphazoline-Pheniramine (OPCON-A OP) Place 1 drop into both eyes 3 (three) times daily as needed (itchy eyes.).    ondansetron (ZOFRAN) 4 MG tablet Take 1 tablet (4 mg total) by mouth every 4 (four) hours as needed for nausea.   Sennosides-Docusate Sodium (STOOL SOFTENER & LAXATIVE PO) Take 2 tablets by mouth at bedtime.   Syringe/Needle, Disp, (SYRINGE 3CC/22GX1-1/2") 22G X 1-1/2" 3 ML MISC Use to administer monthly b12 shots   No facility-administered encounter medications on file as of 04/27/2020.   Recent Office Vitals: BP Readings from Last 3 Encounters:  04/21/20 (!) 110/50  03/17/20 124/60  02/28/20 (!) 159/60   Pulse Readings from Last 3 Encounters:  04/21/20 64  03/17/20 (!) 53  02/28/20 68    Wt Readings from Last 3 Encounters:  04/21/20 278 lb 4 oz (126.2 kg)  03/17/20 273 lb 4 oz (123.9 kg)  10/07/19 273 lb (123.8 kg)     Kidney Function Lab Results  Component Value Date/Time   CREATININE 1.74 (H) 03/09/2020 02:12 PM   CREATININE 1.95 (H) 04/20/2019 10:26 AM   CREATININE 1.53 (H) 06/18/2013 11:08 AM   GFR 38.77 (L) 03/09/2020 02:12 PM   GFRNONAA 45 (L) 10/19/2018 08:25 AM   GFRNONAA 47 (L) 06/18/2013 11:08 AM   GFRAA 52 (L) 10/19/2018 08:25 AM   GFRAA 54 (L) 06/18/2013 11:08 AM    BMP Latest Ref Rng & Units 03/09/2020 04/20/2019 11/04/2018  Glucose 70  - 99 mg/dL 76 96 160(H)  BUN 6 - 23 mg/dL 26(H) 45(H) 25(H)  Creatinine 0.40 - 1.50 mg/dL 1.74(H) 1.95(H) 1.94(H)  BUN/Creat Ratio 10 - 24 - - -  Sodium 135 - 145 mEq/L 145 144 143  Potassium 3.5 - 5.1 mEq/L 4.6 5.2(H) 4.6  Chloride 96 - 112 mEq/L 102 99 104  CO2 19 - 32 mEq/L 34(H) 36(H) 31  Calcium 8.4 - 10.5 mg/dL 9.4 9.6 9.1    Current antihypertensive regimen:  Doxazosin 1mg .  1 tablet twice daily (Cardiology recent dc'd amlodipine 04/21/2020) Losartan 25 mg - 1 tablet daily at bedtime Lasix 80 mg - 1 tablet daily and an additional tablet as needed   Patient verbally confirms he is taking the above medications as directed. Yes  How often are you checking your Blood Pressure? several times per month   04/20/2020 120/58  51P  he checks his blood pressure in the morning after taking his medication.  Current home BP readings:    DATE:             BP               PULSE  03/17/2020 124/60  53  04/20/2020 120/58  51  04/21/2020 110/50  64  Wrist or arm cuff:  Arm cuff Caffeine intake: drinks diet soda Salt intake: tries to limit. OTC medications including pseudoephedrine or NSAIDs?  Reports sinus pill OTC, unknown brand, for hayfever  Any readings above 180/120? No If yes any symptoms of hypertensive emergency? patient denies any symptoms of high blood pressure   What recent interventions/DTPs have been made by any provider to improve Blood Pressure control since last CPP Visit:   Increase Furosemide 80mg  daily + PRN  START Doxazosin 1 mg - 1 tablet twice daily  STOP amlodipine          Any recent hospitalizations or ED visits since last visit with CPP? No  What diet changes have been made to improve Blood Pressure Control?                    None identified  What exercise is being done to improve your Blood Pressure Control?   The patient reports he goes to the grocery store and walks around to get some exercise.  Adherence Review: Is the patient currently on ACE/ARB  medication? Yes Does the patient have >5 day gap between last estimated fill dates? No gaps in adherence   Star Rating Drugs:  Medication:  Last Fill: Day Supply Losartan25mg . 03/24/2020 90ds Lovastatin 40mg . 311/2022 90ds  The patient reports that he is buying his insulin from the [facility] currently and is getting a good price for this at this time. Denies med cost concerns.  Follow-Up:  Pharmacist Review  Debbora Dus, CPP notified  Avel Sensor, CCMA Clinical  Pharmacy Assistant [phone]  I have reviewed the care management and care coordination activities outlined in this encounter and I am certifying that I agree with the content of this note. No further action required.  Debbora Dus, PharmD Clinical Pharmacist [facility] at [facility] [phone]

## 2020-05-02 ENCOUNTER — Other Ambulatory Visit: Payer: Self-pay

## 2020-05-02 NOTE — Telephone Encounter (Signed)
-----   Message from [place], [place] sent at 04/28/2020  2:10 PM EDT ----- Regarding: pt. requests refils Contact: [phone] Pt. Is requesting a refill on his colchicine 0.6 mg  prn. ( he is out)  [employer] CMA

## 2020-05-02 NOTE — Telephone Encounter (Signed)
Colchicine Last rx:  03/01/20, #30 Last OV:  03/17/20, post COVID chronic fatigue Next OV:  06/21/20, AWV prt 2

## 2020-05-04 MED ORDER — COLCHICINE 0.6 MG PO TABS: 0.6000 mg | ORAL_TABLET | Freq: Every day | ORAL | 4 refills | Status: DC | PRN

## 2020-05-12 ENCOUNTER — Ambulatory Visit: Admission: RE | Admit: 2020-05-12 | Payer: Medicare Other | Source: Ambulatory Visit

## 2020-05-26 DIAGNOSIS — N1831 Chronic kidney disease, stage 3a: Secondary | ICD-10-CM | POA: Diagnosis not present

## 2020-06-02 ENCOUNTER — Telehealth: Payer: Self-pay | Admitting: Endocrinology

## 2020-06-02 NOTE — Telephone Encounter (Signed)
[facility] calling in stating they need a prescription for the patient for due to the patient wanting these numbers of bottles   insulin NPH Human (HUMULIN N) 100 UNIT/ML injection 3 bottles   insulin regular (NOVOLIN R RELION) 100 units/mL injection 5 bottles   Lowell Castalia, [place]

## 2020-06-05 ENCOUNTER — Encounter: Payer: Self-pay | Admitting: Family Medicine

## 2020-06-05 ENCOUNTER — Telehealth: Payer: Self-pay

## 2020-06-05 ENCOUNTER — Encounter: Payer: Medicare Other | Admitting: Family Medicine

## 2020-06-05 NOTE — Telephone Encounter (Signed)
Patient called stating that he did not see a doctor come on for his virtual visit. He is not sure if he did something wrong or if it was on our end. Patient was not able to wait to re do the visit at the time of his call now due to having to drive his wife to an appointment in [place].  Patient apologized if it was something that he did wrong, he did say that he allowed both audio and video for his visit on the phone.  He is re scheduled with Dr Glori Bickers for tomorrow 06/06/20. FYI to both providers.

## 2020-06-05 NOTE — Progress Notes (Signed)
  This encounter was created in error - please disregard.  Left prior to appointment

## 2020-06-05 NOTE — Telephone Encounter (Signed)
Per appt notes pt has video appt with Dr Lorelei Pont 06/05/20 at 11 AM.

## 2020-06-05 NOTE — Telephone Encounter (Signed)
Noted.  Apologies.  I was 30 minutes late to his appointment time.  I did have some relatively urgent matters to address this morning, and it caused this delay.

## 2020-06-05 NOTE — Telephone Encounter (Signed)
[facility] - Client TELEPHONE ADVICE RECORD [facility] Patient Name: Donald [name] Gender: Male DOB: [birth year] Age: 75 Y 5 M 8 D Return Phone Number: [phone] (Primary) Address: City/ State/ Zip: [place] [address] Client [facility] - Client Client Site [place] - Day Physician Ria Bush - MD Contact Type Call Who Is Calling Patient / Member / Family / Caregiver Call Type Triage / Clinical Relationship To Patient Self Return Phone Number [phone] (Primary) Chief Complaint WHEEZING Reason for Call Symptomatic / Request for [place] has been sick for a week is coughing up phlegm and he is wheezing as well. He is also taking over the counter meds and nothing is helping. Translation No Nurse Assessment Nurse: Hardin Negus, RN, Mardene Celeste Date/Time Eilene Ghazi Time): 06/05/2020 8:52:58 AM Confirm and document reason for call. If symptomatic, describe symptoms. ---Caller has been sick for a week is coughing up yellow phlegm and he is wheezing as well. He is also taking over the counter meds and nothing is helping The s&s are not getting any better. He did not sleep well last night.. Does the patient have any new or worsening symptoms? ---Yes Will a triage be completed? ---Yes Related visit to physician within the last 2 weeks? ---No Does the PT have any chronic conditions? (i.e. diabetes, asthma, this includes High risk factors for pregnancy, etc.) ---Yes List chronic conditions. ---diabetic Is this a behavioral health or substance abuse call? ---No Guidelines Guideline Title Affirmed Question Affirmed Notes Nurse Date/Time (Eastern Time) COVID-19 - Diagnosed or Suspected MILD difficulty breathing (e.g., minimal/no SOB at rest, SOB with walking, pulse <100) Oren Bracket 06/05/2020 8:54:15 AM PLEASE NOTE: All timestamps  contained within this report are represented as [place] Standard Time. CONFIDENTIALTY NOTICE: This fax transmission is intended only for the addressee. It contains information that is legally privileged, confidential or otherwise protected from use or disclosure. If you are not the intended recipient, you are strictly prohibited from reviewing, disclosing, copying using or disseminating any of this information or taking any action in reliance on or regarding this information. If you have received this fax in error, please notify us immediately by telephone so that we can arrange for its return to [name]. Phone: [phone], Toll-Free: [phone], Fax: [phone] Page: 2 of 2 Call Id: [id number] [place]. Time Eilene Ghazi Time) Disposition Final User 06/05/2020 8:51:22 AM Send to Urgent Queue Ander Gaster 06/05/2020 8:59:01 AM See HCP within 4 Hours (or PCP triage) Yes Hardin Negus, RN, [place] Disagree/Comply Comply Caller Understands Yes PreDisposition Call Doctor Care Advice Given Per Guideline SEE HCP (OR PCP TRIAGE) WITHIN 4 HOURS: CALL BACK IF: * You become worse CARE ADVICE given per COVID-19 - DIAGNOSED OR SUSPECTED (Adult) guideline. Comments User: Ledora Bottcher, RN Date/Time Eilene Ghazi Time): 06/05/2020 8:54:02 AM glucose 200 this AM Referrals Warm transfer to backline

## 2020-06-06 ENCOUNTER — Other Ambulatory Visit: Payer: Self-pay

## 2020-06-06 ENCOUNTER — Encounter: Payer: Self-pay | Admitting: Family Medicine

## 2020-06-06 ENCOUNTER — Telehealth (INDEPENDENT_AMBULATORY_CARE_PROVIDER_SITE_OTHER): Payer: Medicare Other | Admitting: Family Medicine

## 2020-06-06 DIAGNOSIS — J209 Acute bronchitis, unspecified: Secondary | ICD-10-CM | POA: Diagnosis not present

## 2020-06-06 MED ORDER — AZITHROMYCIN 250 MG PO TABS: ORAL_TABLET | ORAL | 0 refills | Status: DC

## 2020-06-06 NOTE — Assessment & Plan Note (Signed)
With mild reactive airways in pt with DM2 Prod cough/yellow sputum and no temp  Neg covid test at home Albuterol mdi is helping a lot- pt does not feel he needs steroid at this time  Close contact with wife who has pneumonia /was in hospital (not covid) Px zpak  Will continue otc expectorant and fluids inst to call if worse chest tightness or wheezing Update if not starting to improve in a week or if worsening

## 2020-06-06 NOTE — Patient Instructions (Signed)
Drink fluids and rest and continue mucinex Take zpak as directed Continue using albuterol inhaler as needed  Call asap if wheezing/chest tightness worsens Also if fever or failure to improve  Update if not starting to improve in a week or if worsening

## 2020-06-06 NOTE — Telephone Encounter (Signed)
LVM for pt to let him know that he needs to contact the office to schedule an appt before any more refills. He has not been seen since 09/2019

## 2020-06-06 NOTE — Progress Notes (Signed)
Virtual Visit via Video Note  I connected with Donald [name] on 06/06/20 at 10:30 AM EDT by a video enabled telemedicine application and verified that I am speaking with the correct person using two identifiers.  Location: Patient: home Provider: office   I discussed the limitations of evaluation and management by telemedicine and the availability of in person appointments. The patient expressed understanding and agreed to proceed.  Parties involved in encounter  Patient: Donald [name]  Provider:  Loura Pardon MD   Pt could not connect to video so the visit was done by phone  History of Present Illness: 75 yo pt of Dr Darnell Level presents with cough and wheezing H/o DM2 and CKD and CHF He has a past h/o asthmatic bronchitis and OSA as well as past PE and upper airway cough syndrome   Symptoms started 8 days ago with post nasal drip and runny nose Then cough  Then wheezing - used inhaler and that helps a lot  Phlegm is yellow in color  Slight sore throat- improved  No nasal congestion / just a lot of drainage  No sinus pain  No temp  Worse in the evening  Last few nights - a little better Home covid test was negative    His wife was just in the hospital with pneumonia  (suspect bacterial, not covid) He is worried he caught something from her    Otc: [employer] brand of mucinex    he is covid immunized with booster  He had covid end of January followed by post covid syndrome with fatigue    Lab Results  Component Value Date   CREATININE 1.74 (H) 03/09/2020   BUN 26 (H) 03/09/2020   NA 145 03/09/2020   K 4.6 03/09/2020   CL 102 03/09/2020   CO2 34 (H) 03/09/2020   Patient Active Problem List   Diagnosis Date Noted  . Acute bronchitis 06/06/2020  . Hearing loss of right ear due to cerumen impaction 03/18/2020  . Tremor 03/18/2020  . Post-COVID-19 syndrome manifesting as chronic fatigue 02/28/2020  . Cervical radiculopathy 05/19/2019  . Acute on chronic renal  insufficiency 11/04/2018  . Heart failure with preserved ejection fraction ([place]) 10/28/2018  . Pre-op evaluation 08/12/2018  . Carotid stenosis, asymptomatic, bilateral 03/29/2018  . Medicare annual wellness visit, subsequent 03/20/2018  . Advanced care planning/counseling discussion 03/20/2018  . Vitamin D deficiency 03/20/2018  . Vitamin B12 deficiency 03/20/2018  . Systolic murmur 83/41/9622  . Type 2 diabetes mellitus with diabetic chronic kidney disease ([place]) 12/19/2017  . Constipation 12/19/2017  . Chronic gout 12/19/2017  . Diabetic peripheral neuropathy ([place]) 10/06/2017  . Carpal tunnel syndrome of left wrist 09/19/2017  . PAD (peripheral artery disease) ([place]) 05/07/2017  . Upper airway cough syndrome 08/21/2016  . S/P parathyroidectomy ([place]) 04/18/2016  . Uncontrolled type 2 diabetes mellitus with peripheral neuropathy ([place]) 03/12/2016  . ECG abnormal 02/28/2016  . Loss of hearing 05/30/2015  . CKD stage 3 due to type 2 diabetes mellitus ([place]) 01/27/2014  . Obesity, morbid, BMI 40.0-49.9 ([place]) 03/08/2013  . Impotence of organic origin 03/11/2011  . CAD (coronary artery disease) 11/19/2010  . Recurrent nephrolithiasis 03/26/2010  . Acute sinusitis 02/27/2010  . HEMATURIA UNSPECIFIED 02/27/2010  . Dyslipidemia 04/13/2009  . ALLERGIC RHINITIS 04/13/2009  . DEPRESSION 03/14/2009  . PROTEINURIA, MILD 09/16/2008  . History of deep vein thrombosis (DVT) of lower extremity 12/03/2007  . OSA (obstructive sleep apnea) 12/03/2007  . History of pulmonary embolus (PE) 10/05/2007  .  [place] DISEASE, LUMBAR 10/05/2007  . Chronic insomnia 08/21/2007  . Pernicious anemia 11/20/2006  . Iron deficiency anemia 07/26/2006  . Essential hypertension 07/26/2006  . Asthmatic bronchitis 07/26/2006  . GERD 07/26/2006  . COLONIC POLYPS, HX OF 07/26/2006   Past Medical History:  Diagnosis Date  . (HFpEF) heart failure with preserved ejection fraction ([place])    a. 09/2018 Echo: EF 60-65%. PASP  89mmHg. Mild-mod LAE. Mild MR/TR.  Marland Kitchen ANEMIA-IRON DEFICIENCY 07/26/2006  . Anxiety   . ASTHMA 07/26/2006  . Asthma   . Back pain, chronic   . Bell's palsy   . CKD (chronic kidney disease), stage III ([place])   . COLONIC POLYPS, HX OF 07/26/2006  . COVID-19 virus infection 01/2020  . DEPRESSION 03/14/2009  . DIABETES MELLITUS, TYPE II 07/26/2006  . [place] DISEASE, LUMBAR 10/05/2007  . DVT 12/03/2007  . DYSLIPIDEMIA 04/13/2009  . Dyspnea    when gets up and walks around and back is hurting really bad -only Shortness of breath  then  . GERD 07/26/2006  . Gout   . Heart murmur   . History of kidney stones   . HYPERTENSION 07/26/2006  . INSOMNIA 08/21/2007  . Neuropathy   . Nonobstructive CAD (coronary artery disease)    a. 2012 Cath: no high grade stenosis; b. 2018 MV: No ischemia. Attenuation artifact.   . OBSTRUCTIVE SLEEP APNEA 12/03/2007   Use C-PAP  . PERIPHERAL NEUROPATHY 07/26/2006  . Pernicious anemia 11/20/2006  . PULMONARY EMBOLISM 10/05/2007   Past Surgical History:  Procedure Laterality Date  . APPENDECTOMY  1968  . [place], 06/14/2008   Dr. Trenton Gammon at [facility] (06/10)  . CARDIAC CATHETERIZATION    . CARPAL TUNNEL RELEASE Right 10/2017  . CATARACT EXTRACTION W/PHACO Left 05/30/2014   Procedure: CATARACT EXTRACTION PHACO AND INTRAOCULAR LENS PLACEMENT (IOC);  Surgeon: Estill Cotta, MD;  Location: [facility] ORS;  Service: Ophthalmology;  Laterality: Left;  [name] 01:20 AP% 23.7 CDE 31.86  . COLONOSCOPY  10/2002   HP, SSA, TA, rpt 3 yrs ([place])  . COLONOSCOPY  01/2005   diverticulosis rpt 5 yrs ([place])   . IR NEPHROSTOMY PLACEMENT LEFT  10/19/2018  . IR URETERAL STENT LEFT NEW ACCESS W/O SEP NEPHROSTOMY CATH  10/19/2018  . LITHOTRIPSY     X 2  . NEPHROLITHOTOMY Left 10/19/2018   Procedure: NEPHROLITHOTOMY PERCUTANEOUS;  Surgeon: Franchot Gallo, MD;  Location: [facility] ORS;  Service: Urology;  Laterality: Left;  3 HRS  . PARATHYROIDECTOMY Right 04/18/2016    PARATHYROIDECTOMY for cyst [name], MD)  . SHOULDER ARTHROSCOPY W/ ROTATOR CUFF REPAIR Right   . TONSILLECTOMY     Social History   Tobacco Use  . Smoking status: Never Smoker  . Smokeless tobacco: Never Used  Vaping Use  . Vaping Use: Never used  Substance Use Topics  . Alcohol use: No  . Drug use: No   Family History  Problem Relation Age of Onset  . Cancer Mother        Breast Cancer  . Cancer Sister        Breast Cancer  . Diabetes Father   . Heart disease Father   . Diabetes Paternal Grandfather    Allergies  Allergen Reactions  . Codeine Other (See Comments)    REACTION: chest pain  . Pioglitazone Other (See Comments)    REACTION to Actos: swelling in ankles   Current Outpatient Medications on File Prior to Visit  Medication Sig Dispense Refill  . albuterol (  VENTOLIN HFA) 108 (90 Base) MCG/ACT inhaler Inhale 2 puffs into the lungs every 6 (six) hours as needed for wheezing or shortness of breath. 8 g 0  . allopurinol (ZYLOPRIM) 300 MG tablet TAKE ONE TABLET EVERY DAY 30 tablet 10  . ALPRAZolam (XANAX) 1 MG tablet TAKE ONE TABLET AT BEDTIME IF NEEDED FORSLEEP 30 tablet 0  . aspirin EC 81 MG tablet Take 1 tablet (81 mg total) by mouth daily.    . Cholecalciferol (VITAMIN D3) 25 MCG (1000 UT) CAPS Take 1 capsule (1,000 Units total) by mouth daily. 30 capsule   . colchicine (COLCRYS) 0.6 MG tablet Take 1 tablet (0.6 mg total) by mouth daily as needed (gout flare). On first day of gout flare may take 2 tablets at once. 30 tablet 4  . cyanocobalamin (,VITAMIN B-12,) 1000 MCG/ML injection Inject 1 mL (1,000 mcg total) into the muscle every 30 (thirty) days. 1 mL 11  . Cyanocobalamin (B-12) 1000 MCG SUBL Place 1 tablet under the tongue daily.    Marland Kitchen doxazosin (CARDURA) 1 MG tablet Take 1 tablet (1 mg total) by mouth 2 (two) times daily. 180 tablet 3  . fluticasone (FLONASE) 50 MCG/ACT nasal spray Place 2 sprays into both nostrils daily. 16 g 1  . furosemide (LASIX)  80 MG tablet TAKE ONE TABLET (80 MG) BY MOUTH EVERY DAY take an extra lasix for swelling 1-2 a week 100 tablet 3  . gabapentin (NEURONTIN) 600 MG tablet TAKE TWO TABLETS AT BEDTIME 180 tablet 3  . glucose blood (GE100 BLOOD GLUCOSE TEST) test strip 1 each by Other route 4 (four) times daily. E11.9 360 each 0  . HYDROcodone-acetaminophen (NORCO) 10-325 MG tablet Take 1 tablet by mouth every 6 (six) hours as needed for moderate pain ((typically once to twice daily if needed)).  30 tablet 0  . insulin NPH Human (HUMULIN N) 100 UNIT/ML injection Inject 2.9 mLs (290 Units total) into the skin at bedtime. 90 mL 5  . insulin regular (NOVOLIN R RELION) 100 units/mL injection 3 times a day (just before each meal), 210-150-290 units, and syringes 4/day. 210 mL 0  . losartan (COZAAR) 25 MG tablet Take 1 tablet (25 mg total) by mouth at bedtime. 90 tablet 3  . lovastatin (MEVACOR) 40 MG tablet Take 2 tablets (80 mg total) by mouth at bedtime. 180 tablet 3  . Naphazoline-Pheniramine (OPCON-A OP) Place 1 drop into both eyes 3 (three) times daily as needed (itchy eyes.).     Marland Kitchen ondansetron (ZOFRAN) 4 MG tablet Take 1 tablet (4 mg total) by mouth every 4 (four) hours as needed for nausea. 10 tablet 0  . Sennosides-Docusate Sodium (STOOL SOFTENER & LAXATIVE PO) Take 2 tablets by mouth at bedtime.    . Syringe/Needle, Disp, (SYRINGE 3CC/22GX1-1/2") 22G X 1-1/2" 3 ML MISC Use to administer monthly b12 shots 50 each 0   No current facility-administered medications on file prior to visit.   Review of Systems  Constitutional: Negative for chills, fever and malaise/fatigue.  HENT: Positive for sore throat. Negative for congestion, ear pain and sinus pain.   Eyes: Negative for blurred vision, discharge and redness.  Respiratory: Positive for cough, sputum production and wheezing. Negative for shortness of breath and stridor.   Cardiovascular: Negative for chest pain, palpitations and leg swelling.  Gastrointestinal:  Negative for abdominal pain, diarrhea, nausea and vomiting.  Musculoskeletal: Negative for myalgias.  Skin: Negative for rash.  Neurological: Negative for dizziness and headaches.    Observations/Objective: Pt  sounds well, in no distress Voice is mildly hoarse No audible wheeze or sob noted in conversation  Few dry sounding coughs  Nl cognition, good historian  Nl mood    Assessment and Plan: Problem List Items Addressed This Visit      Respiratory   Acute bronchitis    With mild reactive airways in pt with DM2 Prod cough/yellow sputum and no temp  Neg covid test at home Albuterol mdi is helping a lot- pt does not feel he needs steroid at this time  Close contact with wife who has pneumonia /was in hospital (not covid) Px zpak  Will continue otc expectorant and fluids inst to call if worse chest tightness or wheezing Update if not starting to improve in a week or if worsening             Follow Up Instructions: Drink fluids and rest and continue mucinex Take zpak as directed Continue using albuterol inhaler as needed  Call asap if wheezing/chest tightness worsens Also if fever or failure to improve  Update if not starting to improve in a week or if worsening     I discussed the assessment and treatment plan with the patient. The patient was provided an opportunity to ask questions and all were answered. The patient agreed with the plan and demonstrated an understanding of the instructions.   The patient was advised to call back or seek an in-person evaluation if the symptoms worsen or if the condition fails to improve as anticipated.  I provided 18 minutes of non-face-to-face time during this encounter.   Loura Pardon, MD

## 2020-06-07 ENCOUNTER — Other Ambulatory Visit: Payer: Self-pay | Admitting: Endocrinology

## 2020-06-07 DIAGNOSIS — E119 Type 2 diabetes mellitus without complications: Secondary | ICD-10-CM

## 2020-06-11 ENCOUNTER — Other Ambulatory Visit: Payer: Self-pay | Admitting: Family Medicine

## 2020-06-11 DIAGNOSIS — Z125 Encounter for screening for malignant neoplasm of prostate: Secondary | ICD-10-CM

## 2020-06-11 DIAGNOSIS — E559 Vitamin D deficiency, unspecified: Secondary | ICD-10-CM

## 2020-06-11 DIAGNOSIS — IMO0002 Reserved for concepts with insufficient information to code with codable children: Secondary | ICD-10-CM

## 2020-06-11 DIAGNOSIS — D510 Vitamin B12 deficiency anemia due to intrinsic factor deficiency: Secondary | ICD-10-CM

## 2020-06-11 DIAGNOSIS — E785 Hyperlipidemia, unspecified: Secondary | ICD-10-CM

## 2020-06-11 DIAGNOSIS — E1122 Type 2 diabetes mellitus with diabetic chronic kidney disease: Secondary | ICD-10-CM

## 2020-06-11 DIAGNOSIS — D509 Iron deficiency anemia, unspecified: Secondary | ICD-10-CM

## 2020-06-11 DIAGNOSIS — M1A9XX0 Chronic gout, unspecified, without tophus (tophi): Secondary | ICD-10-CM

## 2020-06-11 DIAGNOSIS — E538 Deficiency of other specified B group vitamins: Secondary | ICD-10-CM

## 2020-06-11 DIAGNOSIS — E892 Postprocedural hypoparathyroidism: Secondary | ICD-10-CM

## 2020-06-11 DIAGNOSIS — E1142 Type 2 diabetes mellitus with diabetic polyneuropathy: Secondary | ICD-10-CM

## 2020-06-14 ENCOUNTER — Other Ambulatory Visit: Payer: Medicare Other

## 2020-06-19 ENCOUNTER — Other Ambulatory Visit: Payer: Self-pay | Admitting: Family Medicine

## 2020-06-19 ENCOUNTER — Other Ambulatory Visit: Payer: Self-pay

## 2020-06-19 ENCOUNTER — Other Ambulatory Visit (INDEPENDENT_AMBULATORY_CARE_PROVIDER_SITE_OTHER): Payer: Medicare Other

## 2020-06-19 DIAGNOSIS — E785 Hyperlipidemia, unspecified: Secondary | ICD-10-CM | POA: Diagnosis not present

## 2020-06-19 DIAGNOSIS — D509 Iron deficiency anemia, unspecified: Secondary | ICD-10-CM | POA: Diagnosis not present

## 2020-06-19 DIAGNOSIS — N183 Chronic kidney disease, stage 3 unspecified: Secondary | ICD-10-CM | POA: Diagnosis not present

## 2020-06-19 DIAGNOSIS — E559 Vitamin D deficiency, unspecified: Secondary | ICD-10-CM | POA: Diagnosis not present

## 2020-06-19 DIAGNOSIS — E1122 Type 2 diabetes mellitus with diabetic chronic kidney disease: Secondary | ICD-10-CM

## 2020-06-19 DIAGNOSIS — E1165 Type 2 diabetes mellitus with hyperglycemia: Secondary | ICD-10-CM

## 2020-06-19 DIAGNOSIS — E1142 Type 2 diabetes mellitus with diabetic polyneuropathy: Secondary | ICD-10-CM

## 2020-06-19 DIAGNOSIS — D510 Vitamin B12 deficiency anemia due to intrinsic factor deficiency: Secondary | ICD-10-CM

## 2020-06-19 DIAGNOSIS — E538 Deficiency of other specified B group vitamins: Secondary | ICD-10-CM

## 2020-06-19 DIAGNOSIS — M1A9XX0 Chronic gout, unspecified, without tophus (tophi): Secondary | ICD-10-CM

## 2020-06-19 DIAGNOSIS — E892 Postprocedural hypoparathyroidism: Secondary | ICD-10-CM

## 2020-06-19 DIAGNOSIS — Z125 Encounter for screening for malignant neoplasm of prostate: Secondary | ICD-10-CM

## 2020-06-19 DIAGNOSIS — IMO0002 Reserved for concepts with insufficient information to code with codable children: Secondary | ICD-10-CM

## 2020-06-19 LAB — CBC WITH DIFFERENTIAL/PLATELET
Basophils Absolute: 0 10*3/uL (ref 0.0–0.1)
Basophils Relative: 0.7 % (ref 0.0–3.0)
Eosinophils Absolute: 0.2 10*3/uL (ref 0.0–0.7)
Eosinophils Relative: 3.3 % (ref 0.0–5.0)
HCT: 39.9 % (ref 39.0–52.0)
Hemoglobin: 13.2 g/dL (ref 13.0–17.0)
Lymphocytes Relative: 24.3 % (ref 12.0–46.0)
Lymphs Abs: 1.7 10*3/uL (ref 0.7–4.0)
MCHC: 33.1 g/dL (ref 30.0–36.0)
MCV: 86 fl (ref 78.0–100.0)
Monocytes Absolute: 0.3 10*3/uL (ref 0.1–1.0)
Monocytes Relative: 4.5 % (ref 3.0–12.0)
Neutro Abs: 4.6 10*3/uL (ref 1.4–7.7)
Neutrophils Relative %: 67.2 % (ref 43.0–77.0)
Platelets: 190 10*3/uL (ref 150.0–400.0)
RBC: 4.63 Mil/uL (ref 4.22–5.81)
RDW: 16.4 % — ABNORMAL HIGH (ref 11.5–15.5)
WBC: 6.8 10*3/uL (ref 4.0–10.5)

## 2020-06-19 LAB — COMPREHENSIVE METABOLIC PANEL
ALT: 16 U/L (ref 0–53)
AST: 15 U/L (ref 0–37)
Albumin: 4 g/dL (ref 3.5–5.2)
Alkaline Phosphatase: 94 U/L (ref 39–117)
BUN: 34 mg/dL — ABNORMAL HIGH (ref 6–23)
CO2: 33 mEq/L — ABNORMAL HIGH (ref 19–32)
Calcium: 9.4 mg/dL (ref 8.4–10.5)
Chloride: 101 mEq/L (ref 96–112)
Creatinine, Ser: 1.53 mg/dL — ABNORMAL HIGH (ref 0.40–1.50)
GFR: 45.15 mL/min — ABNORMAL LOW (ref >60.00)
Glucose, Bld: 225 mg/dL — ABNORMAL HIGH (ref 70–99)
Potassium: 4.1 mEq/L (ref 3.5–5.1)
Sodium: 144 mEq/L (ref 135–145)
Total Bilirubin: 1.4 mg/dL — ABNORMAL HIGH (ref 0.2–1.2)
Total Protein: 6.1 g/dL (ref 6.0–8.3)

## 2020-06-19 LAB — IBC PANEL
Iron: 43 ug/dL (ref 42–165)
Saturation Ratios: 13.3 % — ABNORMAL LOW (ref 20.0–50.0)
Transferrin: 231 mg/dL (ref 212.0–360.0)

## 2020-06-19 LAB — LIPID PANEL
Cholesterol: 92 mg/dL (ref 0–200)
HDL: 29.2 mg/dL — ABNORMAL LOW (ref >39.00)
LDL Cholesterol: 43 mg/dL (ref 0–99)
NonHDL: 62.46
Total CHOL/HDL Ratio: 3
Triglycerides: 97 mg/dL (ref 0.0–149.0)
VLDL: 19.4 mg/dL (ref 0.0–40.0)

## 2020-06-19 LAB — PSA, MEDICARE: PSA: 0.7 ng/ml (ref 0.10–4.00)

## 2020-06-19 LAB — FERRITIN: Ferritin: 71.2 ng/mL (ref 22.0–322.0)

## 2020-06-19 LAB — VITAMIN D 25 HYDROXY (VIT D DEFICIENCY, FRACTURES): VITD: 32.87 ng/mL (ref 30.00–100.00)

## 2020-06-19 LAB — HEMOGLOBIN A1C: Hgb A1c MFr Bld: 7.6 % — ABNORMAL HIGH (ref 4.6–6.5)

## 2020-06-19 LAB — VITAMIN B12: Vitamin B-12: >1550 pg/mL — ABNORMAL HIGH (ref 211–911)

## 2020-06-19 LAB — URIC ACID: Uric Acid, Serum: 6.6 mg/dL (ref 4.0–7.8)

## 2020-06-19 LAB — FOLATE: Folate: 7.7 ng/mL (ref >5.9)

## 2020-06-20 ENCOUNTER — Ambulatory Visit: Payer: Medicare Other

## 2020-06-20 LAB — PARATHYROID HORMONE, INTACT (NO CA): PTH: 87 pg/mL — ABNORMAL HIGH (ref 16–77)

## 2020-06-20 NOTE — Telephone Encounter (Signed)
ERx 

## 2020-06-21 ENCOUNTER — Other Ambulatory Visit: Payer: Self-pay

## 2020-06-21 ENCOUNTER — Encounter: Payer: Self-pay | Admitting: Family Medicine

## 2020-06-21 ENCOUNTER — Ambulatory Visit (INDEPENDENT_AMBULATORY_CARE_PROVIDER_SITE_OTHER): Payer: Medicare Other | Admitting: Family Medicine

## 2020-06-21 VITALS — BP 138/66 | HR 60 | Temp 97.8°F | Ht 64.0 in | Wt 267.2 lb

## 2020-06-21 DIAGNOSIS — N1832 Chronic kidney disease, stage 3b: Secondary | ICD-10-CM

## 2020-06-21 DIAGNOSIS — I2510 Atherosclerotic heart disease of native coronary artery without angina pectoris: Secondary | ICD-10-CM | POA: Diagnosis not present

## 2020-06-21 DIAGNOSIS — E785 Hyperlipidemia, unspecified: Secondary | ICD-10-CM

## 2020-06-21 DIAGNOSIS — Z0000 Encounter for general adult medical examination without abnormal findings: Secondary | ICD-10-CM | POA: Diagnosis not present

## 2020-06-21 DIAGNOSIS — I6523 Occlusion and stenosis of bilateral carotid arteries: Secondary | ICD-10-CM

## 2020-06-21 DIAGNOSIS — E1165 Type 2 diabetes mellitus with hyperglycemia: Secondary | ICD-10-CM

## 2020-06-21 DIAGNOSIS — E1142 Type 2 diabetes mellitus with diabetic polyneuropathy: Secondary | ICD-10-CM | POA: Diagnosis not present

## 2020-06-21 DIAGNOSIS — I10 Essential (primary) hypertension: Secondary | ICD-10-CM | POA: Diagnosis not present

## 2020-06-21 DIAGNOSIS — Z794 Long term (current) use of insulin: Secondary | ICD-10-CM

## 2020-06-21 DIAGNOSIS — D510 Vitamin B12 deficiency anemia due to intrinsic factor deficiency: Secondary | ICD-10-CM | POA: Diagnosis not present

## 2020-06-21 DIAGNOSIS — IMO0002 Reserved for concepts with insufficient information to code with codable children: Secondary | ICD-10-CM

## 2020-06-21 DIAGNOSIS — D509 Iron deficiency anemia, unspecified: Secondary | ICD-10-CM

## 2020-06-21 DIAGNOSIS — G9332 Myalgic encephalomyelitis/chronic fatigue syndrome: Secondary | ICD-10-CM

## 2020-06-21 DIAGNOSIS — Z7189 Other specified counseling: Secondary | ICD-10-CM

## 2020-06-21 DIAGNOSIS — E1122 Type 2 diabetes mellitus with diabetic chronic kidney disease: Secondary | ICD-10-CM | POA: Diagnosis not present

## 2020-06-21 DIAGNOSIS — H9190 Unspecified hearing loss, unspecified ear: Secondary | ICD-10-CM

## 2020-06-21 DIAGNOSIS — E559 Vitamin D deficiency, unspecified: Secondary | ICD-10-CM

## 2020-06-21 DIAGNOSIS — I739 Peripheral vascular disease, unspecified: Secondary | ICD-10-CM | POA: Diagnosis not present

## 2020-06-21 DIAGNOSIS — E538 Deficiency of other specified B group vitamins: Secondary | ICD-10-CM

## 2020-06-21 DIAGNOSIS — E1169 Type 2 diabetes mellitus with other specified complication: Secondary | ICD-10-CM

## 2020-06-21 DIAGNOSIS — R5382 Chronic fatigue, unspecified: Secondary | ICD-10-CM

## 2020-06-21 DIAGNOSIS — R011 Cardiac murmur, unspecified: Secondary | ICD-10-CM

## 2020-06-21 DIAGNOSIS — N200 Calculus of kidney: Secondary | ICD-10-CM | POA: Diagnosis not present

## 2020-06-21 DIAGNOSIS — I5032 Chronic diastolic (congestive) heart failure: Secondary | ICD-10-CM

## 2020-06-21 DIAGNOSIS — N183 Chronic kidney disease, stage 3 unspecified: Secondary | ICD-10-CM

## 2020-06-21 DIAGNOSIS — M1A9XX0 Chronic gout, unspecified, without tophus (tophi): Secondary | ICD-10-CM

## 2020-06-21 DIAGNOSIS — U099 Post covid-19 condition, unspecified: Secondary | ICD-10-CM

## 2020-06-21 MED ORDER — ALLOPURINOL 300 MG PO TABS: 300.0000 mg | ORAL_TABLET | Freq: Every day | ORAL | 3 refills | Status: DC

## 2020-06-21 NOTE — Assessment & Plan Note (Signed)
B12 level high (just had injection last week). Will stop oral B12, continue IM B12 shots, and consider recheck at next visit.

## 2020-06-21 NOTE — Assessment & Plan Note (Signed)
This is better.

## 2020-06-21 NOTE — Patient Instructions (Addendum)
We will sign you up for cologuard. If interested, check with pharmacy about new 2 shot shingles series (shingrix).  Bring [name] copy of your advanced directives to update your chart.  B12 levels were high - stop oral b12 for now.  Let us know if ongoing gout symptoms not controlled with daily allopurinol and as needed colchicine.  Return as needed or in 6 months for follow up visit   Health Maintenance After Age 73 After age 29, you are at a higher risk for certain long-term diseases and infections as well as injuries from falls. Falls are a major cause of broken bones and head injuries in people who are older than age 91. Getting regular preventive care can help to keep you healthy and well. Preventive care includes getting regular testing and making lifestyle changes as recommended by your health care provider. Talk with your health care provider about:  Which screenings and tests you should have. A screening is a test that checks for a disease when you have no symptoms.  A diet and exercise plan that is right for you. What should I know about screenings and tests to prevent falls? Screening and testing are the best ways to find a health problem early. Early diagnosis and treatment give you the best chance of managing medical conditions that are common after age 74. Certain conditions and lifestyle choices may make you more likely to have a fall. Your health care provider may recommend:  Regular vision checks. Poor vision and conditions such as cataracts can make you more likely to have a fall. If you wear glasses, make sure to get your prescription updated if your vision changes.  Medicine review. Work with your health care provider to regularly review all of the medicines you are taking, including over-the-counter medicines. Ask your health care provider about any side effects that may make you more likely to have a fall. Tell your health care provider if any medicines that you take make you feel  dizzy or sleepy.  Osteoporosis screening. Osteoporosis is a condition that causes the bones to get weaker. This can make the bones weak and cause them to break more easily.  Blood pressure screening. Blood pressure changes and medicines to control blood pressure can make you feel dizzy.  Strength and balance checks. Your health care provider may recommend certain tests to check your strength and balance while standing, walking, or changing positions.  Foot health exam. Foot pain and numbness, as well as not wearing proper footwear, can make you more likely to have a fall.  Depression screening. You may be more likely to have a fall if you have a fear of falling, feel emotionally low, or feel unable to do activities that you used to do.  Alcohol use screening. Using too much alcohol can affect your balance and may make you more likely to have a fall. What actions can I take to lower my risk of falls? General instructions  Talk with your health care provider about your risks for falling. Tell your health care provider if: ? You fall. Be sure to tell your health care provider about all falls, even ones that seem minor. ? You feel dizzy, sleepy, or off-balance.  Take over-the-counter and prescription medicines only as told by your health care provider. These include any supplements.  Eat a healthy diet and maintain a healthy weight. A healthy diet includes low-fat dairy products, low-fat (lean) meats, and fiber from whole grains, beans, and lots of fruits and  vegetables. Home safety  Remove any tripping hazards, such as rugs, cords, and clutter.  Install safety equipment such as grab bars in bathrooms and safety rails on stairs.  Keep rooms and walkways well-lit. Activity  Follow a regular exercise program to stay fit. This will help you maintain your balance. Ask your health care provider what types of exercise are appropriate for you.  If you need a cane or walker, use it as  recommended by your health care provider.  Wear supportive shoes that have nonskid soles.   Lifestyle  Do not drink alcohol if your health care provider tells you not to drink.  If you drink alcohol, limit how much you have: ? 0-1 drink a day for women. ? 0-2 drinks a day for men.  Be aware of how much alcohol is in your drink. In the [place] one drink equals one typical bottle of beer (12 oz), one-half glass of wine (5 oz), or one shot of hard liquor (1 oz).  Do not use any products that contain nicotine or tobacco, such as cigarettes and e-cigarettes. If you need help quitting, ask your health care provider. Summary  Having a healthy lifestyle and getting preventive care can help to protect your health and wellness after age 53.  Screening and testing are the best way to find a health problem early and help you avoid having a fall. Early diagnosis and treatment give you the best chance for managing medical conditions that are more common for people who are older than age 56.  Falls are a major cause of broken bones and head injuries in people who are older than age 62. Take precautions to prevent a fall at home.  Work with your health care provider to learn what changes you can make to improve your health and wellness and to prevent falls. This information is not intended to replace advice given to you by your health care provider. Make sure you discuss any questions you have with your health care provider. Document Revised: 04/23/2018 Document Reviewed: 11/13/2016 Elsevier Patient Education  2021 [employer].

## 2020-06-21 NOTE — Assessment & Plan Note (Addendum)
Chronic, stable on lovastatin - continue. Encouraged increased aerobic exercise - however this is limited by back pain. The ASCVD Risk score Mikey Bussing DC Jr., et al., 2013) failed to calculate for the following reasons:   The valid total cholesterol range is 130 to 320 mg/dL

## 2020-06-21 NOTE — Assessment & Plan Note (Signed)
Sees urology ([place])

## 2020-06-21 NOTE — Assessment & Plan Note (Signed)
Known mild MR, AS by latest [name] 2020

## 2020-06-21 NOTE — Assessment & Plan Note (Signed)
Sees nephrology ([place])

## 2020-06-21 NOTE — Assessment & Plan Note (Signed)
Advanced directive discussion - has at home. HCPOA would be wife. Asked to bring us a copy.  

## 2020-06-21 NOTE — Assessment & Plan Note (Signed)
Recent worsening pain with trending urate levels, managed with colchicine. Continue allopurinol 300mg  daily

## 2020-06-21 NOTE — Assessment & Plan Note (Signed)
Forgot hearing aides today.  

## 2020-06-21 NOTE — Assessment & Plan Note (Signed)
Activity limited by low back pain. Will need to focus on dietary choices.

## 2020-06-21 NOTE — Assessment & Plan Note (Signed)
Continue iron daily. Due for cologuard.

## 2020-06-21 NOTE — Assessment & Plan Note (Signed)
Appreciate cards care. 

## 2020-06-21 NOTE — Assessment & Plan Note (Signed)
Stable levels with recent cardiology changes - continue new regimen to include cardura 1mg  bid.

## 2020-06-21 NOTE — Assessment & Plan Note (Signed)
Continue vit D 1000 IU daily. 

## 2020-06-21 NOTE — Assessment & Plan Note (Addendum)
Chronic, stable on insulin followed by endo.  Has upcoming appt planned.

## 2020-06-21 NOTE — Progress Notes (Signed)
Patient ID: Donald [name], male    DOB: 1945/08/26, 75 y.o.   MRN: [id number]  This visit was conducted in person.  BP 138/66   Pulse 60   Temp 97.8 F (36.6 C) (Temporal)   Ht 5\' 4"  (1.626 m)   Wt 267 lb 4 oz (121.2 kg)   SpO2 97%   BMI 45.87 kg/m    CC: AMW Subjective:   HPI: Donald [name] is a 75 y.o. male presenting on 06/21/2020 for Medicare Wellness   Did not see health advisor this year.    Hearing Screening   125Hz  250Hz  500Hz  1000Hz  2000Hz  3000Hz  4000Hz  6000Hz  8000Hz   Right ear:   20 20 40  0    Left ear:   25 40 0  0    Comments: Wears bilateral hearing aids.  Not wearing at today's OV.    Visual Acuity Screening   Right eye Left eye Both eyes  Without correction:     With correction: 20/25 20/25 20/25   Did not wear hearing aides today.  [place] [facility] Visit from 06/21/2020 in [place] at [place]  PHQ-2 Total Score 0      Fall Risk  06/21/2020 04/20/2019 03/20/2018 05/20/2014  Falls in the past year? 0 0 1 No  Number falls in past yr: - 0 0 -  Injury with Fall? - 0 0 -  Risk for fall due to : - Medication side effect - -  Follow up - Falls evaluation completed;Falls prevention discussed - -    Seen recently for acute bronchitis s/p treatment with zpack with benefit.  H/o COVID infection 01/2020 - with lingering fatigue - has now improved.   Saw cardiology 04/2020 for known HFpEF, note reviewed - amlodipine stopped due to pedal edema - cardura 1mg  bid started in its place. This has helped.   Activity limited by lower back pain.   Care Team: Cardiology - Dr Donald [name] Endocrinology - Dr Donald [name] Nephrology - Dr Donald [name] Urology - Dr Donald [name] PM&R - Dr Donald [name] ENT - Dr Donald [name] Podiatry - Dr Donald [name] - Dr Donald [name]  Sleep - Dr Donald [name]  Preventative: Colon cancer screening -colonoscopy 2006 with hyperplastic polyps (Donald [name]). Cologuard normal 01/2016 - due. Agrees to repeat this.  Prostate cancer screening -yearly PSA.no BPH symptoms.   Lung cancer screening -not eligible  Flu shot -yearly Donald [name] 02/2019 x2, Pfizer booster 10/2019  Td 04/2008  Pneumovax 2009, 2004, 2020. prevnar 2016 Zostavax -received twice 2011, 2013 Shingrix -discussed - to discuss with pharmacist  Advanced directive discussion -has at home. HCPOA would be wife. Asked to bring [name] a copy. Seat belt use discussed.  Sunscreen use discussed. No changing moles on skin.Sees Dr Donald [name]. Smoking -none Alcohol - none Dentist -q6 mo (Donald [name])  Eye exam -yearly (Donald [name])  Bowel - no constipation Bladder - no incontinence  Married (to Donald [name]) [place] children (son is Donald [name], dispensing) Worked [employer]-disabled due to back pain. Activity:limited by medical problems Diet:good water, fruits/vegetables daily     Relevant past medical, surgical, family and social history reviewed and updated as indicated. Interim medical history since our last visit reviewed. Allergies and medications reviewed and updated. Outpatient Medications Prior to Visit  Medication Sig Dispense Refill  . albuterol (VENTOLIN HFA) 108 (90 Base) MCG/ACT inhaler Inhale 2 puffs into the lungs every 6 (six) hours as needed for wheezing or shortness of breath. 8 g 0  . ALPRAZolam (XANAX) 1 MG tablet TAKE ONE TABLET AT  BEDTIME IF NEEDED FORSLEEP 30 tablet 0  . aspirin EC 81 MG tablet Take 1 tablet (81 mg total) by mouth daily.    . Cholecalciferol (VITAMIN D3) 25 MCG (1000 UT) CAPS Take 1 capsule (1,000 Units total) by mouth daily. 30 capsule   . colchicine (COLCRYS) 0.6 MG tablet Take 1 tablet (0.6 mg total) by mouth daily as needed (gout flare). On first day of gout flare may take 2 tablets at once. 30 tablet 4  . cyanocobalamin (,VITAMIN B-12,) 1000 MCG/ML injection Inject 1 mL (1,000 mcg total) into the muscle every 30 (thirty) days. 1 mL 11  . doxazosin (CARDURA) 1 MG tablet Take 1 tablet (1 mg total) by mouth 2 (two) times daily. 180 tablet 3  . fluticasone  (FLONASE) 50 MCG/ACT nasal spray Place 2 sprays into both nostrils daily. 16 g 1  . furosemide (LASIX) 80 MG tablet TAKE ONE TABLET (80 MG) BY MOUTH EVERY DAY take an extra lasix for swelling 1-2 a week 100 tablet 3  . gabapentin (NEURONTIN) 600 MG tablet TAKE TWO TABLETS AT BEDTIME 180 tablet 3  . glucose blood (GE100 BLOOD GLUCOSE TEST) test strip 1 each by Other route 4 (four) times daily. E11.9 360 each 0  . HYDROcodone-acetaminophen (NORCO) 10-325 MG tablet Take 1 tablet by mouth every 6 (six) hours as needed for moderate pain ((typically once to twice daily if needed)).  30 tablet 0  . insulin NPH Human (HUMULIN N) 100 UNIT/ML injection Inject 2.9 mLs (290 Units total) into the skin at bedtime. 90 mL 5  . insulin regular (NOVOLIN R RELION) 100 units/mL injection 3 times a day (just before each meal), 210-150-290 units, and syringes 4/day. 210 mL 0  . losartan (COZAAR) 25 MG tablet Take 1 tablet (25 mg total) by mouth at bedtime. 90 tablet 3  . lovastatin (MEVACOR) 40 MG tablet Take 2 tablets (80 mg total) by mouth at bedtime. 180 tablet 3  . Naphazoline-Pheniramine (OPCON-A OP) Place 1 drop into both eyes 3 (three) times daily as needed (itchy eyes.).     Marland Kitchen ondansetron (ZOFRAN) 4 MG tablet Take 1 tablet (4 mg total) by mouth every 4 (four) hours as needed for nausea. 10 tablet 0  . Sennosides-Docusate Sodium (STOOL SOFTENER & LAXATIVE PO) Take 2 tablets by mouth at bedtime.    . Syringe/Needle, Disp, (SYRINGE 3CC/22GX1-1/2") 22G X 1-1/2" 3 ML MISC Use to administer monthly b12 shots 50 each 0  . allopurinol (ZYLOPRIM) 300 MG tablet TAKE ONE TABLET EVERY DAY 30 tablet 10  . Cyanocobalamin (B-12) 1000 MCG SUBL Place 1 tablet under the tongue daily.    Marland Kitchen azithromycin (ZITHROMAX Z-PAK) 250 MG tablet Take 2 pills by mouth today and then 1 pill daily for 4 days 6 tablet 0   No facility-administered medications prior to visit.     Per HPI unless specifically indicated in ROS section below Review  of Systems Objective:  BP 138/66   Pulse 60   Temp 97.8 F (36.6 C) (Temporal)   Ht 5\' 4"  (1.626 m)   Wt 267 lb 4 oz (121.2 kg)   SpO2 97%   BMI 45.87 kg/m   Wt Readings from Last 3 Encounters:  06/21/20 267 lb 4 oz (121.2 kg)  06/06/20 268 lb (121.6 kg)  06/05/20 268 lb (121.6 kg)      Physical Exam Vitals and nursing note reviewed.  Constitutional:      General: He is not in acute distress.  Appearance: Normal appearance. He is well-developed. He is not ill-appearing.  HENT:     Head: Normocephalic and atraumatic.     Right Ear: Hearing, tympanic membrane, ear canal and external ear normal.     Left Ear: Hearing, tympanic membrane, ear canal and external ear normal.  Eyes:     General: No scleral icterus.    Extraocular Movements: Extraocular movements intact.     Conjunctiva/sclera: Conjunctivae normal.     Pupils: Pupils are equal, round, and reactive to light.  Neck:     Thyroid: No thyroid mass or thyromegaly.     Vascular: Carotid bruit (bilat) present.  Cardiovascular:     Rate and Rhythm: Normal rate and regular rhythm.     Pulses: Normal pulses.          Radial pulses are 2+ on the right side and 2+ on the left side.     Heart sounds: Murmur (3/6 systolic USB) heard.    Pulmonary:     Effort: Pulmonary effort is normal. No respiratory distress.     Breath sounds: Normal breath sounds. No wheezing, rhonchi or rales.  Abdominal:     General: Bowel sounds are normal. There is no distension.     Palpations: Abdomen is soft. There is no mass.     Tenderness: There is no abdominal tenderness. There is no guarding or rebound.     Hernia: No hernia is present.  Musculoskeletal:        General: Normal range of motion.     Cervical back: Normal range of motion and neck supple.     Right lower leg: No edema.     Left lower leg: No edema.  Lymphadenopathy:     Cervical: No cervical adenopathy.  Skin:    General: Skin is warm and dry.     Findings: No rash.   Neurological:     General: No focal deficit present.     Mental Status: He is alert and oriented to person, place, and time.     Comments:  CN grossly intact, station and gait intact Recall 3/3 Calculation 5/5 DLROW  Psychiatric:        Mood and Affect: Mood normal.        Behavior: Behavior normal.        Thought Content: Thought content normal.        Judgment: Judgment normal.       Results for orders placed or performed in visit on 06/19/20  Parathyroid hormone, intact (no Ca)  Result Value Ref Range   PTH 87 (H) 16 - 77 pg/mL  PSA, Medicare  Result Value Ref Range   PSA 0.70 0.10 - 4.00 ng/ml  Uric acid  Result Value Ref Range   Uric Acid, Serum 6.6 4.0 - 7.8 mg/dL  CBC with Differential/Platelet  Result Value Ref Range   WBC 6.8 4.0 - 10.5 K/uL   RBC 4.63 4.22 - 5.81 Mil/uL   Hemoglobin 13.2 13.0 - 17.0 g/dL   HCT 39.9 39.0 - 52.0 %   MCV 86.0 78.0 - 100.0 fl   MCHC 33.1 30.0 - 36.0 g/dL   RDW 16.4 (H) 11.5 - 15.5 %   Platelets 190.0 150.0 - 400.0 K/uL   Neutrophils Relative % 67.2 43.0 - 77.0 %   Lymphocytes Relative 24.3 12.0 - 46.0 %   Monocytes Relative 4.5 3.0 - 12.0 %   Eosinophils Relative 3.3 0.0 - 5.0 %   Basophils Relative 0.7 0.0 - 3.0 %  Neutro Abs 4.6 1.4 - 7.7 K/uL   Lymphs Abs 1.7 0.7 - 4.0 K/uL   Monocytes Absolute 0.3 0.1 - 1.0 K/uL   Eosinophils Absolute 0.2 0.0 - 0.7 K/uL   Basophils Absolute 0.0 0.0 - 0.1 K/uL  Hemoglobin A1c  Result Value Ref Range   Hgb A1c MFr Bld 7.6 (H) 4.6 - 6.5 %  Comprehensive metabolic panel  Result Value Ref Range   Sodium 144 135 - 145 mEq/L   Potassium 4.1 3.5 - 5.1 mEq/L   Chloride 101 96 - 112 mEq/L   CO2 33 (H) 19 - 32 mEq/L   Glucose, Bld 225 (H) 70 - 99 mg/dL   BUN 34 (H) 6 - 23 mg/dL   Creatinine, Ser 1.53 (H) 0.40 - 1.50 mg/dL   Total Bilirubin 1.4 (H) 0.2 - 1.2 mg/dL   Alkaline Phosphatase 94 39 - 117 U/L   AST 15 0 - 37 U/L   ALT 16 0 - 53 U/L   Total Protein 6.1 6.0 - 8.3 g/dL   Albumin  4.0 3.5 - 5.2 g/dL   GFR 45.15 (L) >60.00 mL/min   Calcium 9.4 8.4 - 10.5 mg/dL  Lipid panel  Result Value Ref Range   Cholesterol 92 0 - 200 mg/dL   Triglycerides 97.0 0.0 - 149.0 mg/dL   HDL 29.20 (L) >39.00 mg/dL   VLDL 19.4 0.0 - 40.0 mg/dL   LDL Cholesterol 43 0 - 99 mg/dL   Total CHOL/HDL Ratio 3    NonHDL 62.46   Folate  Result Value Ref Range   Folate 7.7 >5.9 ng/mL  VITAMIN D 25 Hydroxy (Vit-D Deficiency, Fractures)  Result Value Ref Range   VITD 32.87 30.00 - 100.00 ng/mL  IBC panel  Result Value Ref Range   Iron 43 42 - 165 ug/dL   Transferrin 231.0 212.0 - 360.0 mg/dL   Saturation Ratios 13.3 (L) 20.0 - 50.0 %  Ferritin  Result Value Ref Range   Ferritin 71.2 22.0 - 322.0 ng/mL  Vitamin B12  Result Value Ref Range   Vitamin B-12 >1550 (H) 211 - 911 pg/mL   Depression screen [facility] 2/9 06/21/2020 04/20/2019 03/20/2018  Decreased Interest 0 0 0  Down, Depressed, Hopeless 0 0 0  PHQ - 2 Score 0 0 0  Altered sleeping 0 0 -  Tired, decreased energy 0 0 -  Change in appetite 0 0 -  Feeling bad or failure about yourself  0 0 -  Trouble concentrating 0 0 -  Moving slowly or fidgety/restless 0 0 -  Suicidal thoughts 0 0 -  PHQ-9 Score 0 0 -  Difficult doing work/chores - Not difficult at all -  Some recent data might be hidden    GAD 7 : Generalized Anxiety Score 06/21/2020  Nervous, Anxious, on Edge 0  Control/stop worrying 0  Worry too much - different things 1  Trouble relaxing 0  Restless 0  Easily annoyed or irritable 0  Afraid - awful might happen 0  Total GAD 7 Score 1   Assessment & Plan:  This visit occurred during the SARS-CoV-2 public health emergency.  Safety protocols were in place, including screening questions prior to the visit, additional usage of staff PPE, and extensive cleaning of exam room while observing appropriate contact time as indicated for disinfecting solutions.   Problem List Items Addressed This Visit    Dyslipidemia associated with  type 2 diabetes mellitus ([place])    Chronic, stable on lovastatin - continue. Encouraged  increased aerobic exercise - however this is limited by back pain. The ASCVD Risk score Mikey Bussing DC Jr., et al., 2013) failed to calculate for the following reasons:   The valid total cholesterol range is 130 to 320 mg/dL       Iron deficiency anemia    Continue iron daily. Due for cologuard.       Pernicious anemia    B12 level high (just had injection last week). Will stop oral B12, continue IM B12 shots, and consider recheck at next visit.       Essential hypertension    Stable levels with recent cardiology changes - continue new regimen to include cardura 1mg  bid.       Recurrent nephrolithiasis    Sees urology ([place])      Relevant Medications   allopurinol (ZYLOPRIM) 300 MG tablet   CAD (coronary artery disease)   Obesity, morbid, BMI 40.0-49.9 ([place])    Activity limited by low back pain. Will need to focus on dietary choices.       CKD stage 3 due to type 2 diabetes mellitus ([place])    Sees nephrology ([place])      Loss of hearing    Forgot hearing aides today.       Uncontrolled type 2 diabetes mellitus with peripheral neuropathy (HCC)    Chronic, stable on insulin followed by endo.  Has upcoming appt planned.       PAD (peripheral artery disease) (HCC)   Type 2 diabetes mellitus with diabetic chronic kidney disease (HCC)   Chronic gout    Recent worsening pain with trending urate levels, managed with colchicine. Continue allopurinol 300mg  daily      Medicare annual wellness visit, subsequent - Primary    I have personally reviewed the Medicare Annual Wellness questionnaire and have noted 1. The patient's medical and social history 2. Their use of alcohol, tobacco or illicit drugs 3. Their current medications and supplements 4. The patient's functional ability including ADL's, fall risks, home safety risks and hearing or visual impairment. Cognitive function has been  assessed and addressed as indicated.  5. Diet and physical activity 6. Evidence for depression or mood disorders The patients weight, height, BMI have been recorded in the chart. I have made referrals, counseling and provided education to the patient based on review of the above and I have provided the pt with a written personalized care plan for preventive services. Provider list updated.. See scanned questionairre as needed for further documentation. Reviewed preventative protocols and updated unless pt declined.       Advanced care planning/counseling discussion    Advanced directive discussion -has at home. HCPOA would be wife. Asked to bring [name] a copy.      Vitamin D deficiency    Continue vit D 1000 IU daily.       Vitamin B12 deficiency    Continue vit b12 shots monthly at home.  Given elevated levels, will hold oral replacement at this time and reassess at f/u visit.       Systolic murmur    Known mild MR, AS by latest [name] 2020       Carotid stenosis, asymptomatic, bilateral   Heart failure with preserved ejection fraction ([place])    Appreciate cards care.       Diabetic peripheral neuropathy (HCC)   RESOLVED: Post-COVID-19 syndrome manifesting as chronic fatigue    This is better.           Meds ordered this encounter  Medications  . allopurinol (ZYLOPRIM) 300 MG tablet    Sig: Take 1 tablet (300 mg total) by mouth daily.    Dispense:  90 tablet    Refill:  3   No orders of the defined types were placed in this encounter.   Patient instructions: We will sign you up for cologuard. If interested, check with pharmacy about new 2 shot shingles series (shingrix).  Bring [name] copy of your advanced directives to update your chart.  B12 levels were high - stop oral b12 for now.  Let us know if ongoing gout symptoms not controlled with daily allopurinol and as needed colchicine.  Return as needed or in 6 months for follow up visit   Follow up plan: Return in  about 6 months (around 12/21/2020) for follow up visit.  Ria Bush, MD

## 2020-06-21 NOTE — Assessment & Plan Note (Signed)

## 2020-06-21 NOTE — Assessment & Plan Note (Signed)
Continue vit b12 shots monthly at home.  Given elevated levels, will hold oral replacement at this time and reassess at f/u visit.

## 2020-06-27 DIAGNOSIS — M792 Neuralgia and neuritis, unspecified: Secondary | ICD-10-CM | POA: Diagnosis not present

## 2020-06-27 DIAGNOSIS — E1140 Type 2 diabetes mellitus with diabetic neuropathy, unspecified: Secondary | ICD-10-CM | POA: Diagnosis not present

## 2020-06-27 DIAGNOSIS — M5412 Radiculopathy, cervical region: Secondary | ICD-10-CM | POA: Diagnosis not present

## 2020-06-27 DIAGNOSIS — M5136 Other intervertebral disc degeneration, lumbar region: Secondary | ICD-10-CM | POA: Diagnosis not present

## 2020-06-27 DIAGNOSIS — M5416 Radiculopathy, lumbar region: Secondary | ICD-10-CM | POA: Diagnosis not present

## 2020-06-28 ENCOUNTER — Other Ambulatory Visit: Payer: Self-pay

## 2020-06-28 ENCOUNTER — Ambulatory Visit
Admission: RE | Admit: 2020-06-28 | Discharge: 2020-06-28 | Disposition: A | Discharge status: Home / Self Care | Payer: Medicare Other | Source: Ambulatory Visit | Attending: Otolaryngology | Admitting: Otolaryngology

## 2020-06-28 DIAGNOSIS — R221 Localized swelling, mass and lump, neck: Secondary | ICD-10-CM | POA: Insufficient documentation

## 2020-07-10 ENCOUNTER — Other Ambulatory Visit: Payer: Self-pay

## 2020-07-10 ENCOUNTER — Ambulatory Visit (INDEPENDENT_AMBULATORY_CARE_PROVIDER_SITE_OTHER): Payer: Medicare Other | Admitting: Endocrinology

## 2020-07-10 VITALS — BP 100/60 | HR 59 | Ht 64.0 in | Wt 265.6 lb

## 2020-07-10 DIAGNOSIS — E1122 Type 2 diabetes mellitus with diabetic chronic kidney disease: Secondary | ICD-10-CM

## 2020-07-10 DIAGNOSIS — I6523 Occlusion and stenosis of bilateral carotid arteries: Secondary | ICD-10-CM

## 2020-07-10 DIAGNOSIS — Z794 Long term (current) use of insulin: Secondary | ICD-10-CM

## 2020-07-10 DIAGNOSIS — E119 Type 2 diabetes mellitus without complications: Secondary | ICD-10-CM

## 2020-07-10 DIAGNOSIS — N1832 Chronic kidney disease, stage 3b: Secondary | ICD-10-CM

## 2020-07-10 LAB — POCT GLYCOSYLATED HEMOGLOBIN (HGB A1C): Hemoglobin A1C: 7.3 % — AB (ref 4.0–5.6)

## 2020-07-10 MED ORDER — INSULIN NPH (HUMAN) (ISOPHANE) 100 UNIT/ML ~~LOC~~ SUSP: 270.0000 [IU] | Freq: Every day | SUBCUTANEOUS | 5 refills | Status: DC

## 2020-07-10 NOTE — Patient Instructions (Addendum)
I have sent a prescription to your pharmacy, to reduce the NPH at bedtime.   Please continue the same regular insulin.   After any steroid injection, take 10 extra units for any blood sugar over 200.   check your blood sugar twice a day.  vary the time of day when you check, between before the 3 meals, and at bedtime.  also check if you have symptoms of your blood sugar being too high or too low.  please keep a record of the readings and bring it to your next appointment here (or you can bring the meter itself).  You can write it on any piece of paper.  please call us sooner if your blood sugar goes below 70, or if you have a lot of readings over 200.   Please come back for a follow-up appointment in 4 months.

## 2020-07-10 NOTE — Progress Notes (Signed)
Subjective:    Patient ID: Donald [name], male    DOB: 1946/01/06, 75 y.o.   MRN: [id number]  HPI Pt returns for f/u of diabetes mellitus: DM type: Insulin-requiring type 2 Dx'ed: 0626 Complications: polyneuropathy, CAD and stage 4 CRI.   Therapy: insulin since 1991.  DKA: never.  Severe hypoglycemia: never.  Pancreatitis: never.  SDOH: he declines name brand meds.  Other: he takes multiple daily injections; he declines weight-loss surgery; he has severe insulin resistance; he declines pump; He often eats 2 meals per day, so he takes only 2 injections of novolog.  Interval history: no cbg record, but states cbg varies from 57-180.  It is in general lowest in the middle of the night, or the afternoon.  No recent steroids.  He has mild hypoglycemia approx once per month.   Past Medical History:  Diagnosis Date   (HFpEF) heart failure with preserved ejection fraction ([place])    a. 09/2018 Echo: EF 60-65%. PASP 9mmHg. Mild-mod LAE. Mild MR/TR.   ANEMIA-IRON DEFICIENCY 07/26/2006   Anxiety    ASTHMA 07/26/2006   Asthma    Back pain, chronic    Bell's palsy    CKD (chronic kidney disease), stage III ([place])    COLONIC POLYPS, HX OF 07/26/2006   COVID-19 virus infection 01/2020   DEPRESSION 03/14/2009   DIABETES MELLITUS, TYPE II 07/26/2006   [place] DISEASE, LUMBAR 10/05/2007   DVT 12/03/2007   DYSLIPIDEMIA 04/13/2009   Dyspnea    when gets up and walks around and back is hurting really bad -only Shortness of breath  then   GERD 07/26/2006   Gout    Heart murmur    History of kidney stones    HYPERTENSION 07/26/2006   INSOMNIA 08/21/2007   Neuropathy    Nonobstructive CAD (coronary artery disease)    a. 2012 Cath: no high grade stenosis; b. 2018 MV: No ischemia. Attenuation artifact.    OBSTRUCTIVE SLEEP APNEA 12/03/2007   Use C-PAP   PERIPHERAL NEUROPATHY 07/26/2006   Pernicious anemia 11/20/2006   PULMONARY EMBOLISM 10/05/2007    Past Surgical History:  Procedure Laterality Date    [place], 06/14/2008   Dr. Trenton Gammon at [facility] [phone])   [place] Right 10/2017   CATARACT EXTRACTION W/PHACO Left 05/30/2014   Procedure: CATARACT EXTRACTION PHACO AND INTRAOCULAR LENS PLACEMENT ([place]);  Surgeon: Estill Cotta, MD;  Location: [facility] ORS;  Service: Ophthalmology;  Laterality: Left;  [name] 01:20 AP% 23.7 CDE 31.86   COLONOSCOPY  10/2002   HP, SSA, TA, rpt 3 yrs ([place])   COLONOSCOPY  01/2005   diverticulosis rpt 5 yrs ([place])    IR NEPHROSTOMY PLACEMENT LEFT  10/19/2018   IR URETERAL STENT LEFT NEW ACCESS W/O SEP NEPHROSTOMY CATH  10/19/2018   LITHOTRIPSY     X 2   NEPHROLITHOTOMY Left 10/19/2018   Procedure: NEPHROLITHOTOMY PERCUTANEOUS;  Surgeon: Franchot Gallo, MD;  Location: [facility] ORS;  Service: Urology;  Laterality: Left;  3 HRS   PARATHYROIDECTOMY Right 04/18/2016   PARATHYROIDECTOMY for cyst [name], MD)   SHOULDER ARTHROSCOPY W/ ROTATOR CUFF REPAIR Right    TONSILLECTOMY      Social History   Socioeconomic History   Marital status: Married    Spouse name: Not on file   Number of children: Not on file   Years of education: Not on file   Highest education level: Not on file  Occupational History   Occupation: Disabled    Employer: [employer]  Tobacco Use   Smoking status: Never   Smokeless tobacco: Never  Vaping Use   Vaping Use: Never used  Substance and Sexual Activity   Alcohol use: No   Drug use: No   Sexual activity: Not on file  Other Topics Concern   Not on file  Social History Narrative   Married   Children   Worked biological [employer]-disabled due to back pain.   Activity is severely limited by medical problems   Diet is "good".   Never a smoker   Alcohol: none         Social Determinants of Radio broadcast assistant Strain: Medium Risk   Difficulty of Paying Living Expenses: Somewhat hard  Food Insecurity: Not on file  Transportation Needs:  Not on file  Physical Activity: Not on file  Stress: Not on file  Social Connections: Not on file  Intimate Partner Violence: Not on file    Current Outpatient Medications on File Prior to Visit  Medication Sig Dispense Refill   albuterol (VENTOLIN HFA) 108 (90 Base) MCG/ACT inhaler Inhale 2 puffs into the lungs every 6 (six) hours as needed for wheezing or shortness of breath. 8 g 0   allopurinol (ZYLOPRIM) 300 MG tablet Take 1 tablet (300 mg total) by mouth daily. 90 tablet 3   ALPRAZolam (XANAX) 1 MG tablet TAKE ONE TABLET AT BEDTIME IF NEEDED FORSLEEP 30 tablet 0   aspirin EC 81 MG tablet Take 1 tablet (81 mg total) by mouth daily.     Cholecalciferol (VITAMIN D3) 25 MCG (1000 UT) CAPS Take 1 capsule (1,000 Units total) by mouth daily. 30 capsule    colchicine (COLCRYS) 0.6 MG tablet Take 1 tablet (0.6 mg total) by mouth daily as needed (gout flare). On first day of gout flare may take 2 tablets at once. 30 tablet 4   cyanocobalamin (,VITAMIN B-12,) 1000 MCG/ML injection Inject 1 mL (1,000 mcg total) into the muscle every 30 (thirty) days. 1 mL 11   doxazosin (CARDURA) 1 MG tablet Take 1 tablet (1 mg total) by mouth 2 (two) times daily. 180 tablet 3   fluticasone (FLONASE) 50 MCG/ACT nasal spray Place 2 sprays into both nostrils daily. 16 g 1   furosemide (LASIX) 80 MG tablet TAKE ONE TABLET (80 MG) BY MOUTH EVERY DAY take an extra lasix for swelling 1-2 a week 100 tablet 3   gabapentin (NEURONTIN) 600 MG tablet TAKE TWO TABLETS AT BEDTIME 180 tablet 3   glucose blood (GE100 BLOOD GLUCOSE TEST) test strip 1 each by Other route 4 (four) times daily. E11.9 360 each 0   HYDROcodone-acetaminophen (NORCO) 10-325 MG tablet Take 1 tablet by mouth every 6 (six) hours as needed for moderate pain ((typically once to twice daily if needed)).  30 tablet 0   insulin regular (NOVOLIN R RELION) 100 units/mL injection 3 times a day (just before each meal), 210-150-290 units, and syringes 4/day. 210 mL 0    losartan (COZAAR) 25 MG tablet Take 1 tablet (25 mg total) by mouth at bedtime. 90 tablet 3   lovastatin (MEVACOR) 40 MG tablet Take 2 tablets (80 mg total) by mouth at bedtime. 180 tablet 3   Naphazoline-Pheniramine (OPCON-A OP) Place 1 drop into both eyes 3 (three) times daily as needed (itchy eyes.).      ondansetron (ZOFRAN) 4 MG tablet Take 1 tablet (4 mg total) by mouth every 4 (four) hours  as needed for nausea. 10 tablet 0   Sennosides-Docusate Sodium (STOOL SOFTENER & LAXATIVE PO) Take 2 tablets by mouth at bedtime.     Syringe/Needle, Disp, (SYRINGE 3CC/22GX1-1/2") 22G X 1-1/2" 3 ML MISC Use to administer monthly b12 shots 50 each 0   No current facility-administered medications on file prior to visit.    Allergies  Allergen Reactions   Codeine Other (See Comments)    REACTION: chest pain   Pioglitazone Other (See Comments)    REACTION to Actos: swelling in ankles    Family History  Problem Relation Age of Onset   Cancer Mother        Breast Cancer   Cancer Sister        Breast Cancer   Diabetes Father    Heart disease Father    Diabetes Paternal Grandfather     BP 100/60 (BP Location: Right Arm, Patient Position: Sitting, Cuff Size: Large)   Pulse (!) 59   Ht 5\' 4"  (1.626 m)   Wt 265 lb 9.6 oz (120.5 kg)   SpO2 94%   BMI 45.59 kg/m    Review of Systems Denies LOC    Objective:   Physical Exam GENERAL: no distress Pulses: dorsalis pedis intact bilat.   MSK: no deformity of the feet.   CV: 2+ bilat leg edema.   Skin:  no ulcer on the feet.  normal color and temp on the feet.   Neuro: sensation is intact to touch on the feet.     Lab Results  Component Value Date   HGBA1C 7.3 (A) 07/10/2020        Assessment & Plan:  Insulin-requiring type 2 DM: Hypoglycemia, due to insulin: this limits aggressiveness of glycemic control  Patient Instructions  I have sent a prescription to your pharmacy, to reduce the NPH at bedtime.   Please continue the same  regular insulin.   After any steroid injection, take 10 extra units for any blood sugar over 200.   check your blood sugar twice a day.  vary the time of day when you check, between before the 3 meals, and at bedtime.  also check if you have symptoms of your blood sugar being too high or too low.  please keep a record of the readings and bring it to your next appointment here (or you can bring the meter itself).  You can write it on any piece of paper.  please call us sooner if your blood sugar goes below 70, or if you have a lot of readings over 200.   Please come back for a follow-up appointment in 4 months.

## 2020-07-18 DIAGNOSIS — M5416 Radiculopathy, lumbar region: Secondary | ICD-10-CM | POA: Diagnosis not present

## 2020-07-18 DIAGNOSIS — Z20822 Contact with and (suspected) exposure to covid-19: Secondary | ICD-10-CM | POA: Diagnosis not present

## 2020-07-25 ENCOUNTER — Telehealth: Payer: Self-pay

## 2020-07-25 NOTE — Chronic Care Management (AMB) (Addendum)
Chronic Care Management Pharmacy Assistant   Name: Donald [name]  MRN: [id number] DOB: 1945-03-19   Reason for Encounter: Reminder Call   Conditions to be addressed/monitored: CAD, HTN, DMII, and CKD Stage 3    Medications: Outpatient Encounter Medications as of 07/25/2020  Medication Sig   albuterol (VENTOLIN HFA) 108 (90 Base) MCG/ACT inhaler Inhale 2 puffs into the lungs every 6 (six) hours as needed for wheezing or shortness of breath.   allopurinol (ZYLOPRIM) 300 MG tablet Take 1 tablet (300 mg total) by mouth daily.   ALPRAZolam (XANAX) 1 MG tablet TAKE ONE TABLET AT BEDTIME IF NEEDED FORSLEEP   aspirin EC 81 MG tablet Take 1 tablet (81 mg total) by mouth daily.   Cholecalciferol (VITAMIN D3) 25 MCG (1000 UT) CAPS Take 1 capsule (1,000 Units total) by mouth daily.   colchicine (COLCRYS) 0.6 MG tablet Take 1 tablet (0.6 mg total) by mouth daily as needed (gout flare). On first day of gout flare may take 2 tablets at once.   cyanocobalamin (,VITAMIN B-12,) 1000 MCG/ML injection Inject 1 mL (1,000 mcg total) into the muscle every 30 (thirty) days.   doxazosin (CARDURA) 1 MG tablet Take 1 tablet (1 mg total) by mouth 2 (two) times daily.   fluticasone (FLONASE) 50 MCG/ACT nasal spray Place 2 sprays into both nostrils daily.   furosemide (LASIX) 80 MG tablet TAKE ONE TABLET (80 MG) BY MOUTH EVERY DAY take an extra lasix for swelling 1-2 a week   gabapentin (NEURONTIN) 600 MG tablet TAKE TWO TABLETS AT BEDTIME   glucose blood (GE100 BLOOD GLUCOSE TEST) test strip 1 each by Other route 4 (four) times daily. E11.9   HYDROcodone-acetaminophen (NORCO) 10-325 MG tablet Take 1 tablet by mouth every 6 (six) hours as needed for moderate pain ((typically once to twice daily if needed)).    insulin NPH Human (HUMULIN N) 100 UNIT/ML injection Inject 2.7 mLs (270 Units total) into the skin at bedtime.   insulin regular (NOVOLIN R RELION) 100 units/mL injection 3 times a day (just before  each meal), 210-150-290 units, and syringes 4/day.   losartan (COZAAR) 25 MG tablet Take 1 tablet (25 mg total) by mouth at bedtime.   lovastatin (MEVACOR) 40 MG tablet Take 2 tablets (80 mg total) by mouth at bedtime.   Naphazoline-Pheniramine (OPCON-A OP) Place 1 drop into both eyes 3 (three) times daily as needed (itchy eyes.).    ondansetron (ZOFRAN) 4 MG tablet Take 1 tablet (4 mg total) by mouth every 4 (four) hours as needed for nausea.   Sennosides-Docusate Sodium (STOOL SOFTENER & LAXATIVE PO) Take 2 tablets by mouth at bedtime.   Syringe/Needle, Disp, (SYRINGE 3CC/22GX1-1/2") 22G X 1-1/2" 3 ML MISC Use to administer monthly b12 shots   No facility-administered encounter medications on file as of 07/25/2020.    Jasper Loser Kampf did not answer the phone to remind him of his upcoming telephone visit with Debbora Dus on 07/31/2020 at 1:00 pm.  He was reminded to have all medications, supplements and any blood glucose and blood pressure readings available for review at appointment. Message left with instructions.    Star Rating Drugs: Medication:   Last Fill: Day Supply Humulin 100unit/ml  07/10/20 90 Losartan 25mg   07/06/20 90 Lovastatin 40mg   06/15/20  90  No fill gaps identified  PCP appointment on 12/22/2020, CCM appointment on 07/31/2020, and Endocrinology appointment on 11/09/2020   Debbora Dus, CPP notified  Avel Sensor, [place] Assistant [phone]  I have reviewed the care management and care coordination activities outlined in this encounter and I am certifying that I agree with the content of this note. No further action required.  Debbora Dus, PharmD Clinical Pharmacist [facility] [phone]

## 2020-07-31 ENCOUNTER — Other Ambulatory Visit: Payer: Self-pay

## 2020-07-31 ENCOUNTER — Ambulatory Visit (INDEPENDENT_AMBULATORY_CARE_PROVIDER_SITE_OTHER): Payer: Medicare Other

## 2020-07-31 DIAGNOSIS — IMO0002 Reserved for concepts with insufficient information to code with codable children: Secondary | ICD-10-CM

## 2020-07-31 DIAGNOSIS — I10 Essential (primary) hypertension: Secondary | ICD-10-CM

## 2020-07-31 DIAGNOSIS — E1142 Type 2 diabetes mellitus with diabetic polyneuropathy: Secondary | ICD-10-CM

## 2020-07-31 DIAGNOSIS — E1165 Type 2 diabetes mellitus with hyperglycemia: Secondary | ICD-10-CM

## 2020-07-31 NOTE — Progress Notes (Signed)
Chronic Care Management Pharmacy Note  07/31/2020 Name:  Donald [name] MRN:  [id number] DOB:  February 20, 1945  Subjective: Donald [name] is an 75 y.o. year old male who is a primary patient of Ria Bush, MD.  The CCM team was consulted for assistance with disease management and care coordination needs.    Engaged with patient by telephone for follow up visit in response to provider referral for pharmacy case management and/or care coordination services. He is doing well other than hip and leg pain. He has steroid injection every 3-4 months. Reports BG has been very good - fasting 100-130s. Occasional 140-150. BP at home 120-130/70. Denies med or health concerns. We will remail PAP forms for insulin.  Consent to Services:  The patient was given information about Chronic Care Management services, agreed to services, and gave verbal consent prior to initiation of services.  Please see initial visit note for detailed documentation.   Patient Care Team: Ria Bush, MD as PCP - General (Family Medicine) Rockey Situ Kathlene November, MD as PCP - Cardiology (Cardiology) Suella Broad, MD (Physical Medicine and Rehabilitation) Earnie Larsson, MD as Attending Physician (Neurosurgery) Rigoberto Noel, MD as Consulting Physician (Pulmonary Disease) Dingeldein, Remo Lipps, MD (Ophthalmology) Minna Merritts, MD as Consulting Physician (Cardiology) Debbora Dus, [facility] as Pharmacist (Pharmacist)  Recent Office visits: 06/21/20 - Dr. Danise Mina, PCP - Stop oral B12 supplement, continue monthly injections. BP stable on Cardura. DM stable on insulin followed by endo. Recent worsening goal, trending urate levels. Continue allopurinol 300 mg daily.  Recent Consult Visits:  04/21/20 - Cardiology -  HFpEF, amlodipine stopped due to pedal edema. Cardura 7m bid started in its place  Hospital visits: None in previous 6 months  Objective:  Lab Results  Component Value Date   CREATININE 1.53 (H)  06/19/2020   BUN 34 (H) 06/19/2020   GFR 45.15 (L) 06/19/2020   GFRNONAA 45 (L) 10/19/2018   GFRAA 52 (L) 10/19/2018   NA 144 06/19/2020   K 4.1 06/19/2020   CALCIUM 9.4 06/19/2020   CO2 33 (H) 06/19/2020    Lab Results  Component Value Date/Time   HGBA1C 7.3 (A) 07/10/2020 11:28 AM   HGBA1C 7.6 (H) 06/19/2020 08:24 AM   HGBA1C 7.8 (H) 03/09/2020 02:12 PM   FRUCTOSAMINE 319 (H) 10/21/2017 04:54 PM   FRUCTOSAMINE 296 (H) 03/11/2011 10:48 AM   GFR 45.15 (L) 06/19/2020 08:24 AM   GFR 38.77 (L) 03/09/2020 02:12 PM   MICROALBUR 12.0 (H) 03/02/2015 11:42 AM   MICROALBUR 2.6 (H) 01/27/2014 11:49 AM    Last diabetic Eye exam:  Lab Results  Component Value Date/Time   HMDIABEYEEXA Retinopathy (A) 05/21/2017 03:54 PM    Last diabetic Foot exam: up to date per endocrinology   Lab Results  Component Value Date   CHOL 92 06/19/2020   HDL 29.20 (L) 06/19/2020   LDLCALC 43 06/19/2020   LDLDIRECT 52.2 05/25/2009   TRIG 97.0 06/19/2020   CHOLHDL 3 06/19/2020    Hepatic Function Latest Ref Rng & Units 06/19/2020 04/20/2019 11/04/2018  Total Protein 6.0 - 8.3 g/dL 6.1 6.5 -  Albumin 3.5 - 5.2 g/dL 4.0 4.3 4.0  AST 0 - 37 U/L 15 15 -  ALT 0 - 53 U/L 16 19 -  Alk Phosphatase 39 - 117 U/L 94 80 -  Total Bilirubin 0.2 - 1.2 mg/dL 1.4(H) 1.0 -  Bilirubin, Direct 0.0 - 0.3 mg/dL - - -    Lab Results  Component Value Date/Time  TSH 3.01 03/09/2020 02:12 PM   TSH 3.14 10/21/2017 04:54 PM    CBC Latest Ref Rng & Units 06/19/2020 03/09/2020 04/20/2019  WBC 4.0 - 10.5 K/uL 6.8 6.8 9.1  Hemoglobin 13.0 - 17.0 g/dL 13.2 13.5 14.1  Hematocrit 39.0 - 52.0 % 39.9 41.3 43.2  Platelets 150.0 - 400.0 K/uL 190.0 198.0 180.0    Lab Results  Component Value Date/Time   VD25OH 32.87 06/19/2020 08:24 AM   VD25OH 21.33 (L) 03/09/2020 02:12 PM    Clinical ASCVD: Yes  The ASCVD Risk score Mikey Bussing DC Jr., et al., 2013) failed to calculate for the following reasons:   The valid total cholesterol range  is 130 to 320 mg/dL    Depression screen [facility] 2/9 06/21/2020 04/20/2019 03/20/2018  Decreased Interest 0 0 0  Down, Depressed, Hopeless 0 0 0  PHQ - 2 Score 0 0 0  Altered sleeping 0 0 -  Tired, decreased energy 0 0 -  Change in appetite 0 0 -  Feeling bad or failure about yourself  0 0 -  Trouble concentrating 0 0 -  Moving slowly or fidgety/restless 0 0 -  Suicidal thoughts 0 0 -  PHQ-9 Score 0 0 -  Difficult doing work/chores - Not difficult at all -  Some recent data might be hidden    Social History   Tobacco Use  Smoking Status Never  Smokeless Tobacco Never   BP Readings from Last 3 Encounters:  07/10/20 100/60  06/21/20 138/66  06/06/20 140/65   Pulse Readings from Last 3 Encounters:  07/10/20 (!) 59  06/21/20 60  06/06/20 (!) 55   Wt Readings from Last 3 Encounters:  07/10/20 265 lb 9.6 oz (120.5 kg)  06/21/20 267 lb 4 oz (121.2 kg)  06/06/20 268 lb (121.6 kg)    Assessment/Interventions: Review of patient past medical history, allergies, medications, health status, including review of consultants reports, laboratory and other test data, was performed as part of comprehensive evaluation and provision of chronic care management services.   SDOH:  (Social Determinants of Health) assessments and interventions performed: Yes SDOH Interventions    Flowsheet Row Most Recent Value  SDOH Interventions   Financial Strain Interventions Other (Comment)  [Restart PAP for insulins]        CCM Care Plan  Allergies  Allergen Reactions   Codeine Other (See Comments)    REACTION: chest pain   Pioglitazone Other (See Comments)    REACTION to Actos: swelling in ankles    Medications Reviewed Today     Reviewed by Debbora Dus, [facility] (Pharmacist) on 07/31/20 at 1328  Med List Status: <None>   Medication Order Taking? Sig Documenting Provider Last Dose Status Informant  albuterol (VENTOLIN HFA) 108 (90 Base) MCG/ACT inhaler [id number] No Inhale 2 puffs into the lungs  every 6 (six) hours as needed for wheezing or shortness of breath.  Patient not taking: Reported on 07/31/2020   Ria Bush, MD Not Taking Active   allopurinol (ZYLOPRIM) 300 MG tablet [id number] Yes Take 1 tablet (300 mg total) by mouth daily. Ria Bush, MD Taking Active   ALPRAZolam Duanne Moron) 1 MG tablet [id number] Yes TAKE ONE TABLET AT BEDTIME IF NEEDED FORSLEEP  Patient taking differently: 1/2 tablet every night - per patient report 07/31/20   Ria Bush, MD Taking Active Self  aspirin EC 81 MG tablet [id number] Yes Take 1 tablet (81 mg total) by mouth daily. Ria Bush, MD Taking Active   Cholecalciferol (VITAMIN D3) 25 MCG (1000  UT) CAPS [id number] Yes Take 1 capsule (1,000 Units total) by mouth daily. Ria Bush, MD Taking Active   colchicine (COLCRYS) 0.6 MG tablet [id number] Yes Take 1 tablet (0.6 mg total) by mouth daily as needed (gout flare). On first day of gout flare may take 2 tablets at once. Ria Bush, MD Taking Active   cyanocobalamin (,VITAMIN B-12,) 1000 MCG/ML injection [id number] Yes Inject 1 mL (1,000 mcg total) into the muscle every 30 (thirty) days. Ria Bush, MD Taking Active   doxazosin (CARDURA) 1 MG tablet [id number] Yes Take 1 tablet (1 mg total) by mouth 2 (two) times daily. Minna Merritts, MD Taking Active   fluticasone [facility]) 50 MCG/ACT nasal spray [id number] No Place 2 sprays into both nostrils daily.  Patient not taking: Reported on 07/31/2020   Elby Beck, FNP Not Taking Active   furosemide (LASIX) 80 MG tablet [id number] Yes TAKE ONE TABLET (80 MG) BY MOUTH EVERY DAY take an extra lasix for swelling 1-2 a week Minna Merritts, MD Taking Active   gabapentin (NEURONTIN) 600 MG tablet [id number] Yes TAKE TWO TABLETS AT BEDTIME Ria Bush, MD Taking Active   glucose blood (GE100 BLOOD GLUCOSE TEST) test strip [id number] Yes 1 each by Other route 4 (four) times daily. E11.9 Renato Shin, MD Taking Active    HYDROcodone-acetaminophen [facility]) 10-325 MG tablet [id number] Yes Take 1 tablet by mouth every 6 (six) hours as needed for moderate pain ((typically once to twice daily if needed)).  Ria Bush, MD Taking Active Self  insulin NPH Human (HUMULIN N) 100 UNIT/ML injection [id number] Yes Inject 2.7 mLs (270 Units total) into the skin at bedtime. Renato Shin, MD Taking Active   insulin regular (NOVOLIN R RELION) 100 units/mL injection [id number] Yes 3 times a day (just before each meal), 210-150-290 units, and syringes 4/day. Renato Shin, MD Taking Active   losartan (COZAAR) 25 MG tablet [id number] Yes Take 1 tablet (25 mg total) by mouth at bedtime. Minna Merritts, MD Taking Active   lovastatin (MEVACOR) 40 MG tablet [id number] Yes Take 2 tablets (80 mg total) by mouth at bedtime. Minna Merritts, MD Taking Active   Naphazoline-Pheniramine [facility]) [id number] Yes Place 1 drop into both eyes 3 (three) times daily as needed (itchy eyes.).  [provider] Taking Active Self  ondansetron (ZOFRAN) 4 MG tablet [id number] No Take 1 tablet (4 mg total) by mouth every 4 (four) hours as needed for nausea.  Patient not taking: Reported on 07/31/2020   Lesleigh Noe, MD Not Taking Active Self  Sennosides-Docusate Sodium (STOOL SOFTENER & LAXATIVE PO) [id number] Yes Take 2 tablets by mouth at bedtime. [provider] Taking Active Self  Syringe/Needle, Disp, (SYRINGE 3CC/22GX1-1/2") 22G X 1-1/2" 3 ML MISC [id number] Yes Use to administer monthly b12 shots Ria Bush, MD Taking Active   Med List Note Marylynn Pearson 10/12/18 1538): cpap machine at bedtime            Patient Active Problem List   Diagnosis Date Noted   Hearing loss of right ear due to cerumen impaction 03/18/2020   Tremor 03/18/2020   Cervical radiculopathy 05/19/2019   Heart failure with preserved ejection fraction ([place]) 10/28/2018   Pre-op evaluation 08/12/2018   Carotid stenosis,  asymptomatic, bilateral 03/29/2018   Medicare annual wellness visit, subsequent 03/20/2018   Advanced care planning/counseling discussion 03/20/2018   Vitamin D deficiency 03/20/2018   Vitamin B12 deficiency 85/88/5027   Systolic murmur 74/12/8784   Type 2  diabetes mellitus with diabetic chronic kidney disease ([place]) 12/19/2017   Constipation 12/19/2017   Chronic gout 12/19/2017   Diabetic peripheral neuropathy ([place]) 10/06/2017   Carpal tunnel syndrome of left wrist 09/19/2017   PAD (peripheral artery disease) ([place]) 05/07/2017   Upper airway cough syndrome 08/21/2016   S/P parathyroidectomy ([place]) 04/18/2016   Uncontrolled type 2 diabetes mellitus with peripheral neuropathy ([place]) 03/12/2016   ECG abnormal 02/28/2016   Loss of hearing 05/30/2015   CKD stage 3 due to type 2 diabetes mellitus ([place]) 01/27/2014   Obesity, morbid, BMI 40.0-49.9 ([place]) 03/08/2013   Impotence of organic origin 03/11/2011   CAD (coronary artery disease) 11/19/2010   Recurrent nephrolithiasis 03/26/2010   Acute sinusitis 02/27/2010   HEMATURIA UNSPECIFIED 02/27/2010   Dyslipidemia associated with type 2 diabetes mellitus ([place]) 04/13/2009   ALLERGIC RHINITIS 04/13/2009   History of depression 03/14/2009   PROTEINURIA, MILD 09/16/2008   History of deep vein thrombosis (DVT) of lower extremity 12/03/2007   OSA (obstructive sleep apnea) 12/03/2007   History of pulmonary embolus (PE) 10/05/2007   DISC DISEASE, LUMBAR 10/05/2007   Chronic insomnia 08/21/2007   Pernicious anemia 11/20/2006   Iron deficiency anemia 07/26/2006   Essential hypertension 07/26/2006   Asthmatic bronchitis 07/26/2006   GERD 07/26/2006   COLONIC POLYPS, HX OF 07/26/2006    Immunization History  Administered Date(s) Administered   Influenza Split 10/05/2010, 10/15/2011   Influenza Whole 10/05/2007, 10/28/2008, 10/03/2009   Influenza, High Dose Seasonal PF 10/21/2017, 10/15/2019   Influenza,inj,Quad PF,6+ Mos 10/08/2012, 09/17/2013,  10/03/2014, 10/09/2015, 10/08/2018   Influenza,inj,quad, With Preservative 10/14/2016   Influenza-Unspecified 10/22/2016   PFIZER(Purple Top)SARS-COV-2 Vaccination 02/20/2019, 03/13/2019, 10/26/2019   Pneumococcal Conjugate-13 08/30/2014   Pneumococcal Polysaccharide-23 11/15/2002, 09/15/2007, 03/20/2018   Td 04/21/2008   Zoster, Live 10/18/2009, 06/24/2011    Conditions to be addressed/monitored:  Hypertension and Diabetes  Patient Care Plan: CCM Pharmacy Care Plan     Problem Identified: CHL AMB "PATIENT-SPECIFIC PROBLEM"      Long-Range Goal: Disease Management   Start Date: 03/10/2020  Priority: High  Note:   Current Barriers:  Affordability - patient never received/completed insulin assistance forms  Pharmacist Clinical Goal(s):  Over the next 30 days, patient will verbalize ability to afford treatment regimen maintain control of diabetes as evidenced by patient report of BG readings within goal through collaboration with PharmD and provider.   Interventions: 1:1 collaboration with Ria Bush, MD regarding development and update of comprehensive plan of care as evidenced by provider attestation and co-signature Inter-disciplinary care team collaboration (see longitudinal plan of care) Comprehensive medication review performed; medication list updated in electronic medical record  Hypertension (BP goal <140/90) -Controlled - per clinic and home readings -Current treatment: Losartan 25 mg - 1 tablet daily at bedtime Lasix 80 mg - 1 tablet daily  Doxazosin 1 mg - 1 tablet twice daily  -Medications previously tried: amlodipine - stopped per Cardio 04/2020 -Current home readings: checks BP when he has a bad headache, reports usually pretty stable - 120-130/60 -Denies hypotensive/hypertensive symptoms -Denies any falls  -He has some ankle swelling at nighttime but usually resolves in the morning. Not checking weight daily. Encouraged this. Denies any  wheezing/shortness of breath.  -Counseled to monitor BP at home weekly, document, and provide log at future appointments -Recommended to continue current medication  Diabetes (A1c goal <7%) -Controlled - per home bg log Managed by endocrinology -Current medications: Humulin N (insulin NPH) - Injects 180 units at bedtime (pt report) Novolin R (insulin  regular) -  Injects 120 units before breakfast (if BG < 100, holds insulin), 170-200 units before lunch and 170-200 units before dinner (15 minutes before meals - pt reports -Medications previously tried: none -Adherence: Taking Novolin 120 before breakfast and up to 200 units before lunch and supper. Taking Humulin N 170-200 at bedtime. -Current home glucose readings - checking 3-4 times per day  fasting glucose: 100-130 usually, occasional 140, 150 depending on what he eats  post prandial glucose: checks at bedtime (2 hours after supper), 180s -Reports less hypoglycemia lately. A couple months ago had a low in grocery story (blurred vision), had a soda and candy bar, he felt better. His sugar was only 90 on recheck. No repeat episodes. -Insulin cost still high. Purchases OTC from [place]. Did not receive PAP forms. -Feet checked by Dr. Loanne Drilling every 3 months  -Recommended to continue current medication; Will re-mail PAP forms. MAIL applications to address, he will bring back here once signed.  Patient Goals/Self-Care Activities Over the next 30 days, patient will:  - take medications as prescribed - complete PAP forms and bring back to office   Medication Assistance: Application for Novolin and Humulin  medication assistance program. in process.  Anticipated assistance start date TBD. Mail PAPs for insulin. Send provider portion to Dr. Loanne Drilling.      Patient's preferred pharmacy is:  [place], [place] - [address] [place] [address] Phone: [phone] Fax: [phone]  [facility]  [address] [place] - [address] [place] [address] Phone: [phone] Fax: [phone]  Uses pill box? Yes Pt endorses 100% compliance  Care Plan and Follow Up Patient Decision:  Patient agrees to Care Plan and Follow-up.  Debbora Dus, PharmD Clinical Pharmacist [facility] at [facility] [phone]

## 2020-08-08 ENCOUNTER — Other Ambulatory Visit: Payer: Self-pay | Admitting: Physical Medicine and Rehabilitation

## 2020-08-08 DIAGNOSIS — M5412 Radiculopathy, cervical region: Secondary | ICD-10-CM

## 2020-08-10 ENCOUNTER — Other Ambulatory Visit: Payer: Self-pay | Admitting: Family Medicine

## 2020-08-10 NOTE — Telephone Encounter (Signed)
ERx 

## 2020-08-11 ENCOUNTER — Telehealth: Payer: Self-pay

## 2020-08-11 NOTE — Progress Notes (Addendum)
Updated and uploaded PAP forms for the patients insulin - Novolin and Humulin. He would like these mailed to him. Sent to front desk for print/mail.  Debbora Dus, CPP notified  Avel Sensor, [place] Assistant [phone]  I have reviewed the care management and care coordination activities outlined in this encounter and I am certifying that I agree with the content of this note. No further action required.  Debbora Dus, PharmD Clinical Pharmacist [facility] at [facility] [phone]

## 2020-08-11 NOTE — Patient Instructions (Signed)
Dear Donald [name],  Below is a summary of the goals we discussed during our follow up appointment on July 31, 2020. Please contact me anytime with questions or concerns.   Visit Information  Patient Care Plan: CCM Pharmacy Care Plan     Problem Identified: CHL AMB "PATIENT-SPECIFIC PROBLEM"      Long-Range Goal: Disease Management   Start Date: 03/10/2020  Priority: High  Note:   Current Barriers:  Affordability - patient never received/completed insulin assistance forms  Pharmacist Clinical Goal(s):  Over the next 30 days, patient will verbalize ability to afford treatment regimen maintain control of diabetes as evidenced by patient report of BG readings within goal through collaboration with PharmD and provider.   Interventions: 1:1 collaboration with Ria Bush, MD regarding development and update of comprehensive plan of care as evidenced by provider attestation and co-signature Inter-disciplinary care team collaboration (see longitudinal plan of care) Comprehensive medication review performed; medication list updated in electronic medical record  Hypertension (BP goal <140/90) -Controlled - per clinic and home readings -Current treatment: Losartan 25 mg - 1 tablet daily at bedtime Lasix 80 mg - 1 tablet daily  Doxazosin 1 mg - 1 tablet twice daily  -Medications previously tried: amlodipine - stopped per Cardio 04/2020 -Current home readings: checks BP when he has a bad headache, reports usually pretty stable - 120-130/60 -Denies hypotensive/hypertensive symptoms -Denies any falls  -He has some ankle swelling at nighttime but usually resolves in the morning. Not checking weight daily. Encouraged this. Denies any wheezing/shortness of breath.  -Counseled to monitor BP at home weekly, document, and provide log at future appointments -Recommended to continue current medication  Diabetes (A1c goal <7%) -Controlled - per home bg log Managed by  endocrinology -Current medications: Humulin N (insulin NPH) - Injects 180 units at bedtime (pt report) Novolin R (insulin regular) -  Injects 120 units before breakfast (if BG < 100, holds insulin), 170-200 units before lunch and 170-200 units before dinner (15 minutes before meals - pt reports -Medications previously tried: none -Adherence: Taking Novolin 120 before breakfast and up to 200 units before lunch and supper. Taking Humulin N 170-200 at bedtime. -Current home glucose readings - checking 3-4 times per day  fasting glucose: 100-130 usually, occasional 140, 150 depending on what he eats  post prandial glucose: checks at bedtime (2 hours after supper), 180s -Reports less hypoglycemia lately. A couple months ago had a low in grocery story (blurred vision), had a soda and candy bar, he felt better. His sugar was only 90 on recheck. No repeat episodes. -Insulin cost still high. Purchases OTC from [place]. Did not receive PAP forms. -Feet checked by Dr. Loanne Drilling every 3 months  -Recommended to continue current medication; Will re-mail PAP forms. MAIL applications to address, he will bring back here once signed.  Patient Goals/Self-Care Activities Over the next 30 days, patient will:  - take medications as prescribed - complete PAP forms and bring back to office   Medication Assistance: Application for Novolin and Humulin  medication assistance program. in process.  Anticipated assistance start date TBD. Mail PAPs for insulin. Send provider portion to Dr. Loanne Drilling.      Patient verbalizes understanding of instructions provided today and agrees to view in [place].   Debbora Dus, PharmD Clinical Pharmacist [facility] at [facility] [phone]

## 2020-08-15 DIAGNOSIS — Z79899 Other long term (current) drug therapy: Secondary | ICD-10-CM | POA: Diagnosis not present

## 2020-08-15 DIAGNOSIS — Z5181 Encounter for therapeutic drug level monitoring: Secondary | ICD-10-CM | POA: Diagnosis not present

## 2020-08-15 DIAGNOSIS — M5412 Radiculopathy, cervical region: Secondary | ICD-10-CM | POA: Diagnosis not present

## 2020-08-15 DIAGNOSIS — G894 Chronic pain syndrome: Secondary | ICD-10-CM | POA: Diagnosis not present

## 2020-08-15 DIAGNOSIS — M62830 Muscle spasm of back: Secondary | ICD-10-CM | POA: Diagnosis not present

## 2020-08-19 ENCOUNTER — Other Ambulatory Visit: Payer: Self-pay

## 2020-08-19 ENCOUNTER — Ambulatory Visit
Admission: RE | Admit: 2020-08-19 | Discharge: 2020-08-19 | Disposition: A | Discharge status: Home / Self Care | Payer: Medicare Other | Source: Ambulatory Visit | Attending: Physical Medicine and Rehabilitation | Admitting: Physical Medicine and Rehabilitation

## 2020-08-19 DIAGNOSIS — M4726 Other spondylosis with radiculopathy, lumbar region: Secondary | ICD-10-CM | POA: Diagnosis not present

## 2020-08-19 DIAGNOSIS — M4802 Spinal stenosis, cervical region: Secondary | ICD-10-CM | POA: Diagnosis not present

## 2020-08-19 DIAGNOSIS — M5412 Radiculopathy, cervical region: Secondary | ICD-10-CM

## 2020-08-23 DIAGNOSIS — N1831 Chronic kidney disease, stage 3a: Secondary | ICD-10-CM | POA: Diagnosis not present

## 2020-08-29 DIAGNOSIS — D2262 Melanocytic nevi of left upper limb, including shoulder: Secondary | ICD-10-CM | POA: Diagnosis not present

## 2020-08-29 DIAGNOSIS — L821 Other seborrheic keratosis: Secondary | ICD-10-CM | POA: Diagnosis not present

## 2020-08-29 DIAGNOSIS — L590 Erythema ab igne [dermatitis ab igne]: Secondary | ICD-10-CM | POA: Diagnosis not present

## 2020-08-29 DIAGNOSIS — X32XXXA Exposure to sunlight, initial encounter: Secondary | ICD-10-CM | POA: Diagnosis not present

## 2020-08-29 DIAGNOSIS — L570 Actinic keratosis: Secondary | ICD-10-CM | POA: Diagnosis not present

## 2020-08-29 DIAGNOSIS — D2271 Melanocytic nevi of right lower limb, including hip: Secondary | ICD-10-CM | POA: Diagnosis not present

## 2020-08-29 DIAGNOSIS — Z85828 Personal history of other malignant neoplasm of skin: Secondary | ICD-10-CM | POA: Diagnosis not present

## 2020-08-29 DIAGNOSIS — D2261 Melanocytic nevi of right upper limb, including shoulder: Secondary | ICD-10-CM | POA: Diagnosis not present

## 2020-08-30 DIAGNOSIS — M5412 Radiculopathy, cervical region: Secondary | ICD-10-CM | POA: Diagnosis not present

## 2020-09-05 DIAGNOSIS — D631 Anemia in chronic kidney disease: Secondary | ICD-10-CM | POA: Diagnosis not present

## 2020-09-05 DIAGNOSIS — N2581 Secondary hyperparathyroidism of renal origin: Secondary | ICD-10-CM | POA: Diagnosis not present

## 2020-09-05 DIAGNOSIS — N1831 Chronic kidney disease, stage 3a: Secondary | ICD-10-CM | POA: Diagnosis not present

## 2020-09-05 DIAGNOSIS — Z6841 Body Mass Index (BMI) 40.0 and over, adult: Secondary | ICD-10-CM | POA: Diagnosis not present

## 2020-09-05 DIAGNOSIS — I129 Hypertensive chronic kidney disease with stage 1 through stage 4 chronic kidney disease, or unspecified chronic kidney disease: Secondary | ICD-10-CM | POA: Diagnosis not present

## 2020-09-05 DIAGNOSIS — G4733 Obstructive sleep apnea (adult) (pediatric): Secondary | ICD-10-CM | POA: Diagnosis not present

## 2020-09-05 DIAGNOSIS — D351 Benign neoplasm of parathyroid gland: Secondary | ICD-10-CM | POA: Diagnosis not present

## 2020-09-05 DIAGNOSIS — E1122 Type 2 diabetes mellitus with diabetic chronic kidney disease: Secondary | ICD-10-CM | POA: Diagnosis not present

## 2020-09-05 DIAGNOSIS — E870 Hyperosmolality and hypernatremia: Secondary | ICD-10-CM | POA: Diagnosis not present

## 2020-09-05 DIAGNOSIS — N179 Acute kidney failure, unspecified: Secondary | ICD-10-CM | POA: Diagnosis not present

## 2020-09-05 DIAGNOSIS — M109 Gout, unspecified: Secondary | ICD-10-CM | POA: Diagnosis not present

## 2020-09-05 DIAGNOSIS — N200 Calculus of kidney: Secondary | ICD-10-CM | POA: Diagnosis not present

## 2020-09-06 DIAGNOSIS — M5412 Radiculopathy, cervical region: Secondary | ICD-10-CM | POA: Diagnosis not present

## 2020-09-16 ENCOUNTER — Other Ambulatory Visit: Payer: Self-pay | Admitting: Family Medicine

## 2020-09-19 NOTE — Telephone Encounter (Signed)
Gabapentin Last filled:  06/19/20, #180 Last OV:  06/21/20 Next OV:  12/22/20, 6 mo f/u

## 2020-09-27 DIAGNOSIS — M5030 Other cervical disc degeneration, unspecified cervical region: Secondary | ICD-10-CM | POA: Diagnosis not present

## 2020-09-27 DIAGNOSIS — M542 Cervicalgia: Secondary | ICD-10-CM | POA: Diagnosis not present

## 2020-10-03 ENCOUNTER — Other Ambulatory Visit: Payer: Self-pay

## 2020-10-03 ENCOUNTER — Ambulatory Visit (INDEPENDENT_AMBULATORY_CARE_PROVIDER_SITE_OTHER): Payer: Medicare Other

## 2020-10-03 ENCOUNTER — Encounter: Payer: Self-pay | Admitting: Podiatry

## 2020-10-03 ENCOUNTER — Ambulatory Visit (INDEPENDENT_AMBULATORY_CARE_PROVIDER_SITE_OTHER): Payer: Medicare Other | Admitting: Podiatry

## 2020-10-03 ENCOUNTER — Ambulatory Visit: Payer: Medicare Other

## 2020-10-03 DIAGNOSIS — M778 Other enthesopathies, not elsewhere classified: Secondary | ICD-10-CM

## 2020-10-03 MED ORDER — BETAMETHASONE SOD PHOS & ACET 6 (3-3) MG/ML IJ SUSP
3.0000 mg | Freq: Once | INTRAMUSCULAR | Status: AC
  Administered 2020-10-03: 3 mg via INTRA_ARTICULAR

## 2020-10-03 NOTE — Progress Notes (Signed)
HPI: 75 y.o. male presenting today for follow-up evaluation of left foot pain.  PMHx uncontrolled diabetes type 2 with peripheral polyneuropathy, chronic CKD.  Patient states that the injections in the past have helped significantly.  The pain is in the same location on the outside of his left foot.  He was last seen in the office on 01/11/2020.  Past Medical History:  Diagnosis Date   (HFpEF) heart failure with preserved ejection fraction ([place])    a. 09/2018 Echo: EF 60-65%. PASP 88mmHg. Mild-mod LAE. Mild MR/TR.   ANEMIA-IRON DEFICIENCY 07/26/2006   Anxiety    ASTHMA 07/26/2006   Asthma    Back pain, chronic    Bell's palsy    CKD (chronic kidney disease), stage III ([place])    COLONIC POLYPS, HX OF 07/26/2006   COVID-19 virus infection 01/2020   DEPRESSION 03/14/2009   DIABETES MELLITUS, TYPE II 07/26/2006   [place] DISEASE, LUMBAR 10/05/2007   DVT 12/03/2007   DYSLIPIDEMIA 04/13/2009   Dyspnea    when gets up and walks around and back is hurting really bad -only Shortness of breath  then   GERD 07/26/2006   Gout    Heart murmur    History of kidney stones    HYPERTENSION 07/26/2006   INSOMNIA 08/21/2007   Neuropathy    Nonobstructive CAD (coronary artery disease)    a. 2012 Cath: no high grade stenosis; b. 2018 MV: No ischemia. Attenuation artifact.    OBSTRUCTIVE SLEEP APNEA 12/03/2007   Use C-PAP   PERIPHERAL NEUROPATHY 07/26/2006   Pernicious anemia 11/20/2006   PULMONARY EMBOLISM 10/05/2007     Physical Exam: General: The patient is alert and oriented x3 in no acute distress.  Dermatology: Skin is warm, dry and supple bilateral lower extremities. Negative for open lesions or macerations.  Vascular: Palpable pedal pulses bilaterally. No edema or erythema noted. Capillary refill within normal limits.  Neurological: Epicritic and protective threshold grossly intact bilaterally.   Musculoskeletal Exam: Range of motion within normal limits to all pedal and ankle joints bilateral.  Muscle strength 5/5 in all groups bilateral.  There is some pain on palpation noted to the lateral aspect of the left foot at the tarsometatarsal joint, specifically the fifth metatarsal cuboid joint.  This correlates with the degenerative changes noted on radiographic exam.  The pain does not correlate to the area of the suspected aneurysmal bone cyst.  Radiographic Exam:  Normal osseous mineralization.  There are some joint space narrowing and articular degenerative changes of the fifth metatarsal cuboid joint.  There is a cortical irregularity along the joint.  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  It appears well-circumscribed and chronic in nature.  This does not correlate to the patient's symptoms.  No pathologic fracture identified.  There may be some chronic old pathologic fractures in the past that have healed.  Assessment: 1.  DJD/capsulitis tarsometatarsal joint lateral left foot 2.  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 Patient evaluated. X-Rays reviewed.  2.  Injection of 0.5 cc Celestone Soluspan injection to the lateral aspect of the TMT joint left foot 3.  No NSAIDs prescribed due to CKD 4.  No prescriptions provided today.  Patient takes allopurinol, colchicine, and gabapentin daily from his PCP 5.  Return to clinic 6 weeks for possible follow-up injection      Edrick Kins, [place]  Dr. Edrick Kins, DPM    [address], [place] [address]                Office [phone]  Fax [phone]

## 2020-10-04 ENCOUNTER — Other Ambulatory Visit: Payer: Self-pay | Admitting: Family Medicine

## 2020-10-04 DIAGNOSIS — Z1211 Encounter for screening for malignant neoplasm of colon: Secondary | ICD-10-CM | POA: Diagnosis not present

## 2020-10-04 DIAGNOSIS — Z1212 Encounter for screening for malignant neoplasm of rectum: Secondary | ICD-10-CM | POA: Diagnosis not present

## 2020-10-04 LAB — HM COLONOSCOPY

## 2020-10-04 LAB — COLOGUARD: Cologuard: NEGATIVE

## 2020-10-04 NOTE — Telephone Encounter (Signed)
Refill  request Alprazolam Last refill 08/10/20 #30 Last office visit 06/21/20 Upcoming appointment 12/22/20

## 2020-10-05 NOTE — Telephone Encounter (Signed)
ERx 

## 2020-10-09 DIAGNOSIS — D170 Benign lipomatous neoplasm of skin and subcutaneous tissue of head, face and neck: Secondary | ICD-10-CM | POA: Diagnosis not present

## 2020-10-09 DIAGNOSIS — H903 Sensorineural hearing loss, bilateral: Secondary | ICD-10-CM | POA: Diagnosis not present

## 2020-10-10 LAB — COLOGUARD: COLOGUARD: NEGATIVE

## 2020-10-13 ENCOUNTER — Telehealth: Payer: Self-pay

## 2020-10-13 ENCOUNTER — Encounter: Payer: Self-pay | Admitting: Family Medicine

## 2020-10-13 NOTE — Telephone Encounter (Signed)
Received faxed negative Cologuard results.  Abstracted results.  Spoke with pt notifying him of neg results and will need to repeat in 3 yrs.  Pt verbalizes understanding.

## 2020-10-13 NOTE — Progress Notes (Signed)
    Chronic Care Management Pharmacy Assistant   Name: OMAR GAYDEN  MRN: [id number] DOB: 05/08/1945  Reason for Encounter: Novolin R [employer]) PAP Update   Called [facility] - did not qualify due to income for [place] 10/12/2020  Debbora Dus, CPP notified  Marijean Niemann, [place] Pharmacy Assistant [phone]

## 2020-10-16 ENCOUNTER — Telehealth: Payer: Self-pay

## 2020-10-16 NOTE — Chronic Care Management (AMB) (Addendum)
Chronic Care Management Pharmacy Assistant   Name: Donald [name]  MRN: [id number] DOB: 09/24/1945  Reason for Encounter: Disease State Diabetes  Recent office visits:  10/13/20-Family Medicine- telephone call -negative cologuard results.   Recent consult visits:  10/03/20-Podiatry-Patient presented for follow up left foot pain.Xrays obtained. Injection [place], follow up 6 weeks 08/30/20-Orthopedics-Patient presented for neck pain.No data found  Hospital visits:  None in previous 6 months  Medications: Outpatient Encounter Medications as of 10/16/2020  Medication Sig   albuterol (VENTOLIN HFA) 108 (90 Base) MCG/ACT inhaler Inhale 2 puffs into the lungs every 6 (six) hours as needed for wheezing or shortness of breath. (Patient not taking: Reported on 07/31/2020)   allopurinol (ZYLOPRIM) 300 MG tablet Take 1 tablet (300 mg total) by mouth daily.   ALPRAZolam (XANAX) 1 MG tablet TAKE 1 TABLET BY MOUTH AT BEDTIME IF NEEDED FOR SLEEP   aspirin EC 81 MG tablet Take 1 tablet (81 mg total) by mouth daily.   Cholecalciferol (VITAMIN D3) 25 MCG (1000 UT) CAPS Take 1 capsule (1,000 Units total) by mouth daily.   colchicine (COLCRYS) 0.6 MG tablet Take 1 tablet (0.6 mg total) by mouth daily as needed (gout flare). On first day of gout flare may take 2 tablets at once.   cyanocobalamin (,VITAMIN B-12,) 1000 MCG/ML injection Inject 1 mL (1,000 mcg total) into the muscle every 30 (thirty) days.   doxazosin (CARDURA) 1 MG tablet Take 1 tablet (1 mg total) by mouth 2 (two) times daily.   fluticasone (FLONASE) 50 MCG/ACT nasal spray Place 2 sprays into both nostrils daily. (Patient not taking: Reported on 07/31/2020)   furosemide (LASIX) 80 MG tablet TAKE ONE TABLET (80 MG) BY MOUTH EVERY DAY take an extra lasix for swelling 1-2 a week   gabapentin (NEURONTIN) 600 MG tablet TAKE TWO TABLETS AT BEDTIME   glucose blood (GE100 BLOOD GLUCOSE TEST) test strip 1 each by Other route 4 (four) times daily.  E11.9   HYDROcodone-acetaminophen (NORCO) 10-325 MG tablet Take 1 tablet by mouth every 6 (six) hours as needed for moderate pain ((typically once to twice daily if needed)).    insulin NPH Human (HUMULIN N) 100 UNIT/ML injection Inject 2.7 mLs (270 Units total) into the skin at bedtime.   insulin regular (NOVOLIN R RELION) 100 units/mL injection 3 times a day (just before each meal), 210-150-290 units, and syringes 4/day.   losartan (COZAAR) 25 MG tablet Take 1 tablet (25 mg total) by mouth at bedtime.   lovastatin (MEVACOR) 40 MG tablet Take 2 tablets (80 mg total) by mouth at bedtime.   Naphazoline-Pheniramine (OPCON-A OP) Place 1 drop into both eyes 3 (three) times daily as needed (itchy eyes.).    ondansetron (ZOFRAN) 4 MG tablet Take 1 tablet (4 mg total) by mouth every 4 (four) hours as needed for nausea. (Patient not taking: Reported on 07/31/2020)   Sennosides-Docusate Sodium (STOOL SOFTENER & LAXATIVE PO) Take 2 tablets by mouth at bedtime.   Syringe/Needle, Disp, (SYRINGE 3CC/22GX1-1/2") 22G X 1-1/2" 3 ML MISC Use to administer monthly b12 shots   No facility-administered encounter medications on file as of 10/16/2020.    Recent Relevant Labs: Lab Results  Component Value Date/Time   HGBA1C 7.3 (A) 07/10/2020 11:28 AM   HGBA1C 7.6 (H) 06/19/2020 08:24 AM   HGBA1C 7.8 (H) 03/09/2020 02:12 PM   MICROALBUR 12.0 (H) 03/02/2015 11:42 AM   MICROALBUR 2.6 (H) 01/27/2014 11:49 AM    Kidney Function Lab Results  Component Value Date/Time   CREATININE 1.53 (H) 06/19/2020 08:24 AM   CREATININE 1.74 (H) 03/09/2020 02:12 PM   CREATININE 1.53 (H) 06/18/2013 11:08 AM   GFR 45.15 (L) 06/19/2020 08:24 AM   GFRNONAA 45 (L) 10/19/2018 08:25 AM   GFRNONAA 47 (L) 06/18/2013 11:08 AM   GFRAA 52 (L) 10/19/2018 08:25 AM   GFRAA 54 (L) 06/18/2013 11:08 AM     Contacted patient on 10/24/20 to discuss diabetes disease state.   Current antihyperglycemic regimen:  Humulin N (insulin NPH) -  injects 150-180 units at bedtime(patient reports) Novolin R (insulin regular) -  injects 170 units morning, 100 units if he has lunch, 180 before dinner .(Patient reports)    Patient verbally confirms he is taking the above medications as directed. Yes  What diet changes have been made to improve diabetes control?  What recent interventions/DTPs have been made to improve glycemic control:  The patient states he gets his insulin for $28 a bottle and pays cash due to the manufacturer told him his income is too high .   Have there been any recent hospitalizations or ED visits since last visit with CPP? No  Patient denies hypoglycemic symptoms, including Pale, Sweaty, Shaky, Hungry, Nervous/irritable, and Vision changes  Patient denies hyperglycemic symptoms, including blurry vision, excessive thirst, fatigue, polyuria, and weakness  How often are you checking your blood sugar? twice daily  What are your blood sugars ranging?  Fasting: 10/23/20-138         10/24/20-152 Averages 130-150 am fasting patient reports. Before meals:  10/23/20-144 Averages 130-150  before dinner patient reports.  During the week, how often does your blood glucose drop below 70? Never  Are you checking your feet daily/regularly? Yes  Adherence Review: Is the patient currently on a STATIN medication? Yes Is the patient currently on ACE/ARB medication? Yes Does the patient have >5 day gap between last estimated fill dates? No  Care Gaps: Annual wellness visit in last year? Yes  06/21/20 Most recent A1C reading: 7.3  07/10/20 Most Recent BP reading: 100/60  59-P  07/10/20  Last eye exam / retinopathy screening: May 2019 Last diabetic foot exam: 07/10/20  Counseled patient on importance of annual eye and foot exam.   Star Rating Drugs:  Medication:  Last Fill: Day Supply Losartan 25mg  07/06/20 90ds Lovastatin 40mg .        09/14/2020           90ds Humulin N  Gets from [employer]  pays cash price Novolin R  Gets  from [employer] pays cash price  PCP appointment on 12/22/20, Endocrinology appointment on 11/09/20, and Cardiology appointment on 10/31/20   Debbora Dus, CPP notified  Avel Sensor, [place] Assistant [phone]  I have reviewed the care management and care coordination activities outlined in this encounter and I am certifying that I agree with the content of this note. No further action required.  Debbora Dus, PharmD Clinical Pharmacist [facility] [phone]

## 2020-10-18 DIAGNOSIS — Z23 Encounter for immunization: Secondary | ICD-10-CM | POA: Diagnosis not present

## 2020-10-26 DIAGNOSIS — M542 Cervicalgia: Secondary | ICD-10-CM | POA: Diagnosis not present

## 2020-10-26 DIAGNOSIS — M5030 Other cervical disc degeneration, unspecified cervical region: Secondary | ICD-10-CM | POA: Diagnosis not present

## 2020-10-30 ENCOUNTER — Inpatient Hospital Stay
Admission: EM | Admit: 2020-10-30 | Discharge: 2020-11-02 | DRG: 280 | Disposition: A | Discharge status: Home / Self Care | Payer: Medicare Other | Attending: Internal Medicine | Admitting: Internal Medicine

## 2020-10-30 ENCOUNTER — Other Ambulatory Visit: Payer: Self-pay

## 2020-10-30 ENCOUNTER — Inpatient Hospital Stay: Payer: Medicare Other

## 2020-10-30 ENCOUNTER — Emergency Department: Payer: Medicare Other

## 2020-10-30 DIAGNOSIS — Z043 Encounter for examination and observation following other accident: Secondary | ICD-10-CM | POA: Diagnosis not present

## 2020-10-30 DIAGNOSIS — M7989 Other specified soft tissue disorders: Secondary | ICD-10-CM | POA: Diagnosis not present

## 2020-10-30 DIAGNOSIS — E876 Hypokalemia: Secondary | ICD-10-CM | POA: Diagnosis present

## 2020-10-30 DIAGNOSIS — I428 Other cardiomyopathies: Secondary | ICD-10-CM | POA: Diagnosis present

## 2020-10-30 DIAGNOSIS — K219 Gastro-esophageal reflux disease without esophagitis: Secondary | ICD-10-CM | POA: Diagnosis present

## 2020-10-30 DIAGNOSIS — Z86718 Personal history of other venous thrombosis and embolism: Secondary | ICD-10-CM

## 2020-10-30 DIAGNOSIS — Z8616 Personal history of COVID-19: Secondary | ICD-10-CM

## 2020-10-30 DIAGNOSIS — E1140 Type 2 diabetes mellitus with diabetic neuropathy, unspecified: Secondary | ICD-10-CM | POA: Diagnosis present

## 2020-10-30 DIAGNOSIS — Z7982 Long term (current) use of aspirin: Secondary | ICD-10-CM

## 2020-10-30 DIAGNOSIS — J45909 Unspecified asthma, uncomplicated: Secondary | ICD-10-CM | POA: Diagnosis present

## 2020-10-30 DIAGNOSIS — Z20822 Contact with and (suspected) exposure to covid-19: Secondary | ICD-10-CM | POA: Diagnosis present

## 2020-10-30 DIAGNOSIS — E11649 Type 2 diabetes mellitus with hypoglycemia without coma: Secondary | ICD-10-CM | POA: Diagnosis present

## 2020-10-30 DIAGNOSIS — Z8679 Personal history of other diseases of the circulatory system: Secondary | ICD-10-CM | POA: Diagnosis not present

## 2020-10-30 DIAGNOSIS — R531 Weakness: Secondary | ICD-10-CM | POA: Diagnosis not present

## 2020-10-30 DIAGNOSIS — N183 Chronic kidney disease, stage 3 unspecified: Secondary | ICD-10-CM | POA: Diagnosis not present

## 2020-10-30 DIAGNOSIS — S0990XA Unspecified injury of head, initial encounter: Secondary | ICD-10-CM | POA: Diagnosis not present

## 2020-10-30 DIAGNOSIS — Z885 Allergy status to narcotic agent status: Secondary | ICD-10-CM

## 2020-10-30 DIAGNOSIS — I25118 Atherosclerotic heart disease of native coronary artery with other forms of angina pectoris: Secondary | ICD-10-CM | POA: Diagnosis not present

## 2020-10-30 DIAGNOSIS — Z6841 Body Mass Index (BMI) 40.0 and over, adult: Secondary | ICD-10-CM | POA: Diagnosis not present

## 2020-10-30 DIAGNOSIS — Z86711 Personal history of pulmonary embolism: Secondary | ICD-10-CM | POA: Diagnosis not present

## 2020-10-30 DIAGNOSIS — G4733 Obstructive sleep apnea (adult) (pediatric): Secondary | ICD-10-CM | POA: Diagnosis present

## 2020-10-30 DIAGNOSIS — N1832 Chronic kidney disease, stage 3b: Secondary | ICD-10-CM

## 2020-10-30 DIAGNOSIS — I10 Essential (primary) hypertension: Secondary | ICD-10-CM | POA: Diagnosis present

## 2020-10-30 DIAGNOSIS — I130 Hypertensive heart and chronic kidney disease with heart failure and stage 1 through stage 4 chronic kidney disease, or unspecified chronic kidney disease: Secondary | ICD-10-CM | POA: Diagnosis present

## 2020-10-30 DIAGNOSIS — R55 Syncope and collapse: Secondary | ICD-10-CM | POA: Diagnosis present

## 2020-10-30 DIAGNOSIS — Z8249 Family history of ischemic heart disease and other diseases of the circulatory system: Secondary | ICD-10-CM

## 2020-10-30 DIAGNOSIS — Z79899 Other long term (current) drug therapy: Secondary | ICD-10-CM

## 2020-10-30 DIAGNOSIS — D510 Vitamin B12 deficiency anemia due to intrinsic factor deficiency: Secondary | ICD-10-CM | POA: Diagnosis present

## 2020-10-30 DIAGNOSIS — I5043 Acute on chronic combined systolic (congestive) and diastolic (congestive) heart failure: Secondary | ICD-10-CM | POA: Diagnosis present

## 2020-10-30 DIAGNOSIS — I440 Atrioventricular block, first degree: Secondary | ICD-10-CM | POA: Diagnosis present

## 2020-10-30 DIAGNOSIS — I2510 Atherosclerotic heart disease of native coronary artery without angina pectoris: Secondary | ICD-10-CM | POA: Diagnosis present

## 2020-10-30 DIAGNOSIS — G4700 Insomnia, unspecified: Secondary | ICD-10-CM | POA: Diagnosis present

## 2020-10-30 DIAGNOSIS — F419 Anxiety disorder, unspecified: Secondary | ICD-10-CM | POA: Diagnosis present

## 2020-10-30 DIAGNOSIS — I480 Paroxysmal atrial fibrillation: Secondary | ICD-10-CM | POA: Diagnosis present

## 2020-10-30 DIAGNOSIS — E1122 Type 2 diabetes mellitus with diabetic chronic kidney disease: Secondary | ICD-10-CM | POA: Diagnosis present

## 2020-10-30 DIAGNOSIS — D509 Iron deficiency anemia, unspecified: Secondary | ICD-10-CM | POA: Diagnosis present

## 2020-10-30 DIAGNOSIS — W19XXXA Unspecified fall, initial encounter: Secondary | ICD-10-CM | POA: Diagnosis not present

## 2020-10-30 DIAGNOSIS — R609 Edema, unspecified: Secondary | ICD-10-CM

## 2020-10-30 DIAGNOSIS — Z888 Allergy status to other drugs, medicaments and biological substances status: Secondary | ICD-10-CM

## 2020-10-30 DIAGNOSIS — Z86010 Personal history of colonic polyps: Secondary | ICD-10-CM

## 2020-10-30 DIAGNOSIS — Z794 Long term (current) use of insulin: Secondary | ICD-10-CM

## 2020-10-30 DIAGNOSIS — I214 Non-ST elevation (NSTEMI) myocardial infarction: Principal | ICD-10-CM

## 2020-10-30 DIAGNOSIS — I420 Dilated cardiomyopathy: Secondary | ICD-10-CM | POA: Diagnosis not present

## 2020-10-30 DIAGNOSIS — E785 Hyperlipidemia, unspecified: Secondary | ICD-10-CM | POA: Diagnosis present

## 2020-10-30 DIAGNOSIS — Z87442 Personal history of urinary calculi: Secondary | ICD-10-CM

## 2020-10-30 DIAGNOSIS — N1831 Chronic kidney disease, stage 3a: Secondary | ICD-10-CM | POA: Diagnosis present

## 2020-10-30 DIAGNOSIS — G8929 Other chronic pain: Secondary | ICD-10-CM | POA: Diagnosis present

## 2020-10-30 DIAGNOSIS — R079 Chest pain, unspecified: Secondary | ICD-10-CM | POA: Diagnosis not present

## 2020-10-30 DIAGNOSIS — I429 Cardiomyopathy, unspecified: Secondary | ICD-10-CM | POA: Diagnosis not present

## 2020-10-30 DIAGNOSIS — E1169 Type 2 diabetes mellitus with other specified complication: Secondary | ICD-10-CM

## 2020-10-30 DIAGNOSIS — I6523 Occlusion and stenosis of bilateral carotid arteries: Secondary | ICD-10-CM | POA: Diagnosis present

## 2020-10-30 DIAGNOSIS — R011 Cardiac murmur, unspecified: Secondary | ICD-10-CM | POA: Diagnosis present

## 2020-10-30 DIAGNOSIS — Z833 Family history of diabetes mellitus: Secondary | ICD-10-CM

## 2020-10-30 DIAGNOSIS — M549 Dorsalgia, unspecified: Secondary | ICD-10-CM | POA: Diagnosis present

## 2020-10-30 DIAGNOSIS — M109 Gout, unspecified: Secondary | ICD-10-CM | POA: Diagnosis present

## 2020-10-30 HISTORY — DX: Syncope and collapse: R55

## 2020-10-30 LAB — CBC
HCT: 45.6 % (ref 39.0–52.0)
Hemoglobin: 15.4 g/dL (ref 13.0–17.0)
MCH: 29.8 pg (ref 26.0–34.0)
MCHC: 33.8 g/dL (ref 30.0–36.0)
MCV: 88.4 fL (ref 80.0–100.0)
Platelets: 192 10*3/uL (ref 150–400)
RBC: 5.16 MIL/uL (ref 4.22–5.81)
RDW: 14.4 % (ref 11.5–15.5)
WBC: 9.8 10*3/uL (ref 4.0–10.5)
nRBC: 0 % (ref 0.0–0.2)

## 2020-10-30 LAB — CBG MONITORING, ED
Glucose-Capillary: 117 mg/dL — ABNORMAL HIGH (ref 70–99)
Glucose-Capillary: 206 mg/dL — ABNORMAL HIGH (ref 70–99)

## 2020-10-30 LAB — BASIC METABOLIC PANEL
Anion gap: 14 (ref 5–15)
BUN: 23 mg/dL (ref 8–23)
CO2: 29 mmol/L (ref 22–32)
Calcium: 9.3 mg/dL (ref 8.9–10.3)
Chloride: 98 mmol/L (ref 98–111)
Creatinine, Ser: 1.42 mg/dL — ABNORMAL HIGH (ref 0.61–1.24)
GFR, Estimated: 53 mL/min — ABNORMAL LOW (ref >60)
Glucose, Bld: 88 mg/dL (ref 70–99)
Potassium: 2.9 mmol/L — ABNORMAL LOW (ref 3.5–5.1)
Sodium: 141 mmol/L (ref 135–145)

## 2020-10-30 LAB — URINALYSIS, COMPLETE (UACMP) WITH MICROSCOPIC
Bacteria, UA: NONE SEEN
Bilirubin Urine: NEGATIVE
Glucose, UA: NEGATIVE mg/dL
Ketones, ur: NEGATIVE mg/dL
Leukocytes,Ua: NEGATIVE
Nitrite: NEGATIVE
Protein, ur: 30 mg/dL — AB
Specific Gravity, Urine: 1.005 (ref 1.005–1.030)
pH: 6 (ref 5.0–8.0)

## 2020-10-30 LAB — TROPONIN I (HIGH SENSITIVITY): Troponin I (High Sensitivity): 132 ng/L (ref <18)

## 2020-10-30 LAB — RESP PANEL BY RT-PCR (FLU A&B, COVID) ARPGX2
Influenza A by PCR: NEGATIVE
Influenza B by PCR: NEGATIVE
SARS Coronavirus 2 by RT PCR: NEGATIVE

## 2020-10-30 LAB — TSH: TSH: 2.448 u[IU]/mL (ref 0.350–4.500)

## 2020-10-30 LAB — D-DIMER, QUANTITATIVE: D-Dimer, Quant: 0.96 ug/mL-FEU — ABNORMAL HIGH (ref 0.00–0.50)

## 2020-10-30 LAB — T4, FREE: Free T4: 1.04 ng/dL (ref 0.61–1.12)

## 2020-10-30 MED ORDER — ASPIRIN 81 MG PO CHEW
324.0000 mg | CHEWABLE_TABLET | Freq: Once | ORAL | Status: AC
  Administered 2020-10-30: 324 mg via ORAL
  Filled 2020-10-30: qty 4

## 2020-10-30 MED ORDER — LOSARTAN POTASSIUM 50 MG PO TABS: 25.0000 mg | ORAL_TABLET | Freq: Every day | ORAL | Status: DC

## 2020-10-30 MED ORDER — HYDRALAZINE HCL 20 MG/ML IJ SOLN: 10.0000 mg | Freq: Four times a day (QID) | INTRAMUSCULAR | Status: DC | PRN

## 2020-10-30 MED ORDER — DOXAZOSIN MESYLATE 1 MG PO TABS
1.0000 mg | ORAL_TABLET | Freq: Two times a day (BID) | ORAL | Status: DC
  Administered 2020-10-30 – 2020-10-31 (×2): 1 mg via ORAL
  Filled 2020-10-30 (×3): qty 1

## 2020-10-30 MED ORDER — DOXAZOSIN MESYLATE 1 MG PO TABS
1.0000 mg | ORAL_TABLET | Freq: Two times a day (BID) | ORAL | Status: DC
  Filled 2020-10-30 (×2): qty 1

## 2020-10-30 MED ORDER — ALLOPURINOL 300 MG PO TABS
300.0000 mg | ORAL_TABLET | Freq: Every day | ORAL | Status: DC
  Administered 2020-10-30 – 2020-11-02 (×4): 300 mg via ORAL
  Filled 2020-10-30 (×4): qty 1

## 2020-10-30 MED ORDER — SODIUM CHLORIDE 0.9% FLUSH
3.0000 mL | Freq: Two times a day (BID) | INTRAVENOUS | Status: DC
  Administered 2020-10-30 – 2020-10-31 (×2): 3 mL via INTRAVENOUS

## 2020-10-30 MED ORDER — ASPIRIN EC 81 MG PO TBEC: 81.0000 mg | DELAYED_RELEASE_TABLET | Freq: Every day | ORAL | Status: DC

## 2020-10-30 MED ORDER — POTASSIUM CHLORIDE CRYS ER 20 MEQ PO TBCR
40.0000 meq | EXTENDED_RELEASE_TABLET | Freq: Once | ORAL | Status: AC
  Administered 2020-10-30: 40 meq via ORAL
  Filled 2020-10-30: qty 2

## 2020-10-30 MED ORDER — ACETAMINOPHEN 325 MG PO TABS: 650.0000 mg | ORAL_TABLET | Freq: Four times a day (QID) | ORAL | Status: DC | PRN

## 2020-10-30 MED ORDER — HYDROCODONE-ACETAMINOPHEN 10-325 MG PO TABS
1.0000 | ORAL_TABLET | Freq: Four times a day (QID) | ORAL | Status: DC | PRN
  Administered 2020-10-31 – 2020-11-02 (×5): 1 via ORAL
  Filled 2020-10-30 (×5): qty 1

## 2020-10-30 MED ORDER — ATORVASTATIN CALCIUM 80 MG PO TABS
80.0000 mg | ORAL_TABLET | Freq: Every day | ORAL | Status: DC
  Administered 2020-10-30 – 2020-11-02 (×4): 80 mg via ORAL
  Filled 2020-10-30: qty 1
  Filled 2020-10-30 (×2): qty 4

## 2020-10-30 MED ORDER — LOSARTAN POTASSIUM 50 MG PO TABS
25.0000 mg | ORAL_TABLET | Freq: Every day | ORAL | Status: DC
  Administered 2020-10-31: 25 mg via ORAL
  Filled 2020-10-30: qty 1

## 2020-10-30 MED ORDER — SODIUM CHLORIDE 0.9% FLUSH
3.0000 mL | Freq: Two times a day (BID) | INTRAVENOUS | Status: DC
  Administered 2020-10-30 – 2020-11-02 (×3): 3 mL via INTRAVENOUS

## 2020-10-30 MED ORDER — HEPARIN SODIUM (PORCINE) 5000 UNIT/ML IJ SOLN
5000.0000 [IU] | Freq: Three times a day (TID) | INTRAMUSCULAR | Status: DC
  Administered 2020-10-30 – 2020-10-31 (×3): 5000 [IU] via SUBCUTANEOUS
  Filled 2020-10-30 (×3): qty 1

## 2020-10-30 MED ORDER — SODIUM CHLORIDE 0.9 % IV SOLN: 250.0000 mL | INTRAVENOUS | Status: DC | PRN

## 2020-10-30 MED ORDER — SODIUM CHLORIDE 0.9% FLUSH: 3.0000 mL | INTRAVENOUS | Status: DC | PRN

## 2020-10-30 MED ORDER — GABAPENTIN 600 MG PO TABS
1200.0000 mg | ORAL_TABLET | Freq: Every day | ORAL | Status: DC
  Administered 2020-10-30 – 2020-11-01 (×3): 1200 mg via ORAL
  Filled 2020-10-30 (×3): qty 2

## 2020-10-30 MED ORDER — INSULIN ASPART 100 UNIT/ML IJ SOLN
0.0000 [IU] | Freq: Three times a day (TID) | INTRAMUSCULAR | Status: DC
  Administered 2020-10-31: 5 [IU] via SUBCUTANEOUS
  Administered 2020-10-31 – 2020-11-01 (×2): 8 [IU] via SUBCUTANEOUS
  Administered 2020-11-01: 5 [IU] via SUBCUTANEOUS
  Administered 2020-11-01: 8 [IU] via SUBCUTANEOUS
  Administered 2020-11-02: 11 [IU] via SUBCUTANEOUS
  Administered 2020-11-02: 8 [IU] via SUBCUTANEOUS
  Filled 2020-10-30 (×7): qty 1

## 2020-10-30 MED ORDER — ASPIRIN EC 81 MG PO TBEC
81.0000 mg | DELAYED_RELEASE_TABLET | Freq: Every day | ORAL | Status: DC
  Administered 2020-10-31 – 2020-11-02 (×3): 81 mg via ORAL
  Filled 2020-10-30 (×3): qty 1

## 2020-10-30 MED ORDER — ACETAMINOPHEN 650 MG RE SUPP
650.0000 mg | Freq: Four times a day (QID) | RECTAL | Status: DC | PRN
  Filled 2020-10-30: qty 1

## 2020-10-30 MED ORDER — NAPHAZOLINE-PHENIRAMINE 0.027-0.315 % OP SOLN
Freq: Three times a day (TID) | OPHTHALMIC | Status: DC | PRN
  Filled 2020-10-30: qty 15

## 2020-10-30 MED ORDER — COLCHICINE 0.6 MG PO TABS
0.6000 mg | ORAL_TABLET | Freq: Every day | ORAL | Status: DC | PRN
  Filled 2020-10-30: qty 2

## 2020-10-30 NOTE — H&P (Addendum)
History and Physical    Donald [name] LNL:[id number] DOB: November 28, 1945 DOA: 10/30/2020  PCP: Ria Bush, MD    Patient coming from:  Home    Chief Complaint:  Syncope    HPI: Donald [name] is a 75 y.o. male seen in ed with complaints of altered mental status.  Patient was on his way to go to his physical therapy appointment.  He was found unconscious on the garage floor  and  does not remember any presyncopal or prodromal or any feelings of dizziness tinnitus blurred vision or chest pain palpitations.  Patient woke up on the floor and does not remember what happened.  Wife probably was on the floor for about an hour and when EMS was called and blood sugar at that time was 68, pt called his wife on the phone and then she called 911, pt said he was awake on floor for about an hour trying to get up.still no symptoms except weakness,he could move his extremities but did not have strength to get up of floor.  Patient denies any headache blurred vision speech or gait issues chest pain palpitations shortness of breath fevers chills flank pain abdominal pain dysuria or bowel bladder complaints.  Pt has past medical history of asthma, CKD,CHF, Anxiety, DM II, DVT, Covid-19, Htn, Neuropathy, OSA. Cardiologist is Dr.Golan.Nephrologist  -[place], urologist [facility] [place] [facility].   ED Course:  Vitals:   10/30/20 1900 10/30/20 1935 10/30/20 1940 10/30/20 2220  BP: (!) 173/73 (!) 192/74 (!) 183/64 (!) 200/168  Pulse: 64 67 73 67  Resp: 18 18 18 15   Temp:      TempSrc:      SpO2: 96% 96% 98% 94%  Weight:      Height:      In the emergency room patient is alert awake oriented afebrile, blood pressure high at 158/132. CMP shows potassium of 2.9 creatinine of 1.42 which is at patient's baseline with a GFR 53, initial troponin of 132, CBC today is within normal limits with a white count of 9.8 hemoglobin of 15.4 and platelet count of 192.  Patient's A1c's have ranged in the high sevens,  last 1 being 7.3 in June of this year.  Urinalysis today shows moderate hemoglobin 0-5 RBCs.  Head CT negative for any acute intracranial abnormalities.  EKG today shows atrial fibrillation with a rate of 76.  No ST-T wave changes.   Review of Systems:  Review of Systems  Constitutional: Negative.   HENT: Negative.    Eyes: Negative.   Respiratory: Negative.    Cardiovascular: Negative.   Gastrointestinal: Negative.   Genitourinary: Negative.   Musculoskeletal: Negative.   Skin: Negative.   Neurological:  Positive for loss of consciousness and weakness. Negative for dizziness, tingling, tremors, sensory change, speech change, focal weakness, seizures and headaches.  Endo/Heme/Allergies: Negative.   Psychiatric/Behavioral: Negative.    All other systems reviewed and are negative.   Past Medical History:  Diagnosis Date   (HFpEF) heart failure with preserved ejection fraction ([place])    a. 09/2018 Echo: EF 60-65%. PASP 92mmHg. Mild-mod LAE. Mild MR/TR.   ANEMIA-IRON DEFICIENCY 07/26/2006   Anxiety    ASTHMA 07/26/2006   Asthma    Back pain, chronic    Bell's palsy    CKD (chronic kidney disease), stage III ([place])    COLONIC POLYPS, HX OF 07/26/2006   COVID-19 virus infection 01/2020   DEPRESSION 03/14/2009   DIABETES MELLITUS, TYPE II 07/26/2006   [place] DISEASE, LUMBAR 10/05/2007  DVT 12/03/2007   DYSLIPIDEMIA 04/13/2009   Dyspnea    when gets up and walks around and back is hurting really bad -only Shortness of breath  then   GERD 07/26/2006   Gout    Heart murmur    History of kidney stones    HYPERTENSION 07/26/2006   INSOMNIA 08/21/2007   Neuropathy    Nonobstructive CAD (coronary artery disease)    a. 2012 Cath: no high grade stenosis; b. 2018 MV: No ischemia. Attenuation artifact.    OBSTRUCTIVE SLEEP APNEA 12/03/2007   Use C-PAP   PERIPHERAL NEUROPATHY 07/26/2006   Pernicious anemia 11/20/2006   PULMONARY EMBOLISM 10/05/2007    Past Surgical History:  Procedure Laterality  Date   [place], 06/14/2008   Dr. Trenton Gammon at [facility] [phone])   [place] Right 10/2017   CATARACT EXTRACTION W/PHACO Left 05/30/2014   Procedure: CATARACT EXTRACTION PHACO AND INTRAOCULAR LENS PLACEMENT ([place]);  Surgeon: Estill Cotta, MD;  Location: [facility] ORS;  Service: Ophthalmology;  Laterality: Left;  [name] 01:20 AP% 23.7 CDE 31.86   COLONOSCOPY  10/2002   HP, SSA, TA, rpt 3 yrs ([place])   COLONOSCOPY  01/2005   diverticulosis rpt 5 yrs ([place])    IR NEPHROSTOMY PLACEMENT LEFT  10/19/2018   IR URETERAL STENT LEFT NEW ACCESS W/O SEP NEPHROSTOMY CATH  10/19/2018   LITHOTRIPSY     X 2   NEPHROLITHOTOMY Left 10/19/2018   Procedure: NEPHROLITHOTOMY PERCUTANEOUS;  Surgeon: Franchot Gallo, MD;  Location: [facility] ORS;  Service: Urology;  Laterality: Left;  3 HRS   PARATHYROIDECTOMY Right 04/18/2016   PARATHYROIDECTOMY for cyst Donald Manner, MD)   SHOULDER ARTHROSCOPY W/ ROTATOR CUFF REPAIR Right    TONSILLECTOMY       reports that he has never smoked. He has never used smokeless tobacco. He reports that he does not drink alcohol and does not use drugs.  Allergies  Allergen Reactions   Codeine Other (See Comments)    REACTION: chest pain   Pioglitazone Other (See Comments)    REACTION to Actos: swelling in ankles    Family History  Problem Relation Age of Onset   Cancer Mother        Breast Cancer   Cancer Sister        Breast Cancer   Diabetes Father    Heart disease Father    Diabetes Paternal Grandfather     Prior to Admission medications   Medication Sig Start Date End Date Taking? Authorizing Provider  albuterol (VENTOLIN HFA) 108 (90 Base) MCG/ACT inhaler Inhale 2 puffs into the lungs every 6 (six) hours as needed for wheezing or shortness of breath. Patient not taking: Reported on 07/31/2020 02/04/20   Ria Bush, MD  allopurinol (ZYLOPRIM) 300 MG tablet Take 1 tablet (300 mg total) by  mouth daily. 06/21/20   Ria Bush, MD  ALPRAZolam Duanne Moron) 1 MG tablet TAKE 1 TABLET BY MOUTH AT BEDTIME IF NEEDED FOR SLEEP 10/05/20   Ria Bush, MD  aspirin EC 81 MG tablet Take 1 tablet (81 mg total) by mouth daily. 02/17/19   Ria Bush, MD  Cholecalciferol (VITAMIN D3) 25 MCG (1000 UT) CAPS Take 1 capsule (1,000 Units total) by mouth daily. 03/17/20   Ria Bush, MD  colchicine (COLCRYS) 0.6 MG tablet Take 1 tablet (0.6 mg total) by mouth daily as needed (gout flare). On first day of gout flare may take 2  tablets at once. 05/04/20   Ria Bush, MD  cyanocobalamin (,VITAMIN B-12,) 1000 MCG/ML injection Inject 1 mL (1,000 mcg total) into the muscle every 30 (thirty) days. 03/17/20   Ria Bush, MD  doxazosin (CARDURA) 1 MG tablet Take 1 tablet (1 mg total) by mouth 2 (two) times daily. 04/21/20   Minna Merritts, MD  fluticasone (FLONASE) 50 MCG/ACT nasal spray Place 2 sprays into both nostrils daily. Patient not taking: Reported on 07/31/2020 06/18/19   Elby Beck, FNP  furosemide (LASIX) 80 MG tablet TAKE ONE TABLET (80 MG) BY MOUTH EVERY DAY take an extra lasix for swelling 1-2 a week 04/21/20   Minna Merritts, MD  gabapentin (NEURONTIN) 600 MG tablet TAKE TWO TABLETS AT BEDTIME 09/19/20   Ria Bush, MD  glucose blood (GE100 BLOOD GLUCOSE TEST) test strip 1 each by Other route 4 (four) times daily. E11.9 07/23/19   Renato Shin, MD  HYDROcodone-acetaminophen [facility]) 10-325 MG tablet Take 1 tablet by mouth every 6 (six) hours as needed for moderate pain ((typically once to twice daily if needed)).  12/19/17   Ria Bush, MD  insulin NPH Human (HUMULIN N) 100 UNIT/ML injection Inject 2.7 mLs (270 Units total) into the skin at bedtime. 07/10/20   Renato Shin, MD  insulin regular (NOVOLIN R RELION) 100 units/mL injection 3 times a day (just before each meal), 210-150-290 units, and syringes 4/day. 11/10/18   Renato Shin, MD  losartan (COZAAR) 25  MG tablet Take 1 tablet (25 mg total) by mouth at bedtime. 04/21/20   Minna Merritts, MD  lovastatin (MEVACOR) 40 MG tablet Take 2 tablets (80 mg total) by mouth at bedtime. 04/21/20   Minna Merritts, MD  Naphazoline-Pheniramine (OPCON-A OP) Place 1 drop into both eyes 3 (three) times daily as needed (itchy eyes.).     [provider]  ondansetron (ZOFRAN) 4 MG tablet Take 1 tablet (4 mg total) by mouth every 4 (four) hours as needed for nausea. Patient not taking: Reported on 07/31/2020 08/10/18   Lesleigh Noe, MD  Sennosides-Docusate Sodium (STOOL SOFTENER & LAXATIVE PO) Take 2 tablets by mouth at bedtime.    [provider]  Syringe/Needle, Disp, (SYRINGE 3CC/22GX1-1/2") 22G X 1-1/2" 3 ML MISC Use to administer monthly b12 shots 03/18/20   Ria Bush, MD    Physical Exam: Vitals:   10/30/20 1900 10/30/20 1935 10/30/20 1940 10/30/20 2220  BP: (!) 173/73 (!) 192/74 (!) 183/64 (!) 200/168  Pulse: 64 67 73 67  Resp: 18 18 18 15   Temp:      TempSrc:      SpO2: 96% 96% 98% 94%  Weight:      Height:       Physical Exam Vitals reviewed.  Constitutional:      General: He is not in acute distress.    Appearance: Normal appearance. He is obese. He is not ill-appearing, toxic-appearing or diaphoretic.  HENT:     Head: Normocephalic and atraumatic.     Right Ear: External ear normal.     Left Ear: External ear normal.     Nose: Nose normal.     Mouth/Throat:     Mouth: Mucous membranes are moist.  Eyes:     Extraocular Movements: Extraocular movements intact.     Pupils: Pupils are equal, round, and reactive to light.  Neck:     Vascular: No carotid bruit.  Cardiovascular:     Rate and Rhythm: Normal rate and regular rhythm.  Pulses:          Dorsalis pedis pulses are 2+ on the right side and 2+ on the left side.       Posterior tibial pulses are 2+ on the right side and 2+ on the left side.     Heart sounds: Normal heart sounds.  Pulmonary:     Effort:  Pulmonary effort is normal.     Breath sounds: Normal breath sounds.  Abdominal:     General: Bowel sounds are normal. There is no distension.     Palpations: Abdomen is soft. There is no mass.     Tenderness: There is no abdominal tenderness. There is no guarding.     Hernia: No hernia is present.  Musculoskeletal:     Right lower leg: 2+ Edema present.     Left lower leg: 2+ Edema present.  Skin:    General: Skin is warm.  Neurological:     General: No focal deficit present.     Mental Status: He is alert and oriented to person, place, and time.  Psychiatric:        Mood and Affect: Mood normal.        Behavior: Behavior normal.   Labs on Admission: I have personally reviewed following labs and imaging studies  No results for input(s): CKTOTAL, CKMB, TROPONINI in the last 72 hours. Lab Results  Component Value Date   WBC 9.8 10/30/2020   HGB 15.4 10/30/2020   HCT 45.6 10/30/2020   MCV 88.4 10/30/2020   PLT 192 10/30/2020    Recent Labs  Lab 10/30/20 1413  NA 141  K 2.9*  CL 98  CO2 29  BUN 23  CREATININE 1.42*  CALCIUM 9.3  GLUCOSE 88   Lab Results  Component Value Date   CHOL 92 06/19/2020   HDL 29.20 (L) 06/19/2020   LDLCALC 43 06/19/2020   TRIG 97.0 06/19/2020   Urinalysis    Component Value Date/Time   COLORURINE STRAW (A) 10/30/2020 1436   APPEARANCEUR CLEAR (A) 10/30/2020 1436   LABSPEC 1.005 10/30/2020 1436   PHURINE 6.0 10/30/2020 1436   GLUCOSEU NEGATIVE 10/30/2020 1436   GLUCOSEU NEGATIVE 10/09/2015 1349   HGBUR MODERATE (A) 10/30/2020 1436   BILIRUBINUR NEGATIVE 10/30/2020 1436   KETONESUR NEGATIVE 10/30/2020 1436   PROTEINUR 30 (A) 10/30/2020 1436   UROBILINOGEN 0.2 10/09/2015 1349   NITRITE NEGATIVE 10/30/2020 1436   LEUKOCYTESUR NEGATIVE 10/30/2020 1436   COVID-19 Labs Recent Labs    10/30/20 1413  DDIMER 0.96*    Lab Results  Component Value Date   SARSCOV2NAA NEGATIVE 10/30/2020   SARSCOV2NAA Detected (A) 02/04/2020    SARSCOV2NAA NOT DETECTED 10/15/2018    Radiological Exams on Admission: CT HEAD WO CONTRAST (5MM)  Result Date: 10/30/2020 CLINICAL DATA:  Head trauma, minor (Age >= 65y).  Unwitnessed fall. EXAM: CT HEAD WITHOUT CONTRAST TECHNIQUE: Contiguous axial images were obtained from the base of the skull through the vertex without intravenous contrast. COMPARISON:  None. FINDINGS: Brain: No acute intracranial abnormality. Specifically, no hemorrhage, hydrocephalus, mass lesion, acute infarction, or significant intracranial injury. Vascular: No hyperdense vessel or unexpected calcification. Skull: No acute calvarial abnormality. Sinuses/Orbits: No acute findings Other: None IMPRESSION: No acute intracranial abnormality. Electronically Signed   By: Rolm Baptise M.D.   On: 10/30/2020 17:56   MR BRAIN WO CONTRAST  Result Date: 10/30/2020 CLINICAL DATA:  Syncopal episode. EXAM: MRI HEAD WITHOUT CONTRAST TECHNIQUE: Multiplanar, multiecho pulse sequences of the brain and surrounding structures  were obtained without intravenous contrast. COMPARISON:  Head CT earlier same day. FINDINGS: Brain: Diffusion imaging does not show any acute or subacute infarction. No focal abnormality affects the brainstem or cerebellum. Cerebral hemispheres show mild age related volume loss with minimal small vessel change of the white matter, less than often seen at this age. No cortical or large vessel territory infarction. No mass lesion, hemorrhage, hydrocephalus or extra-axial collection. Vascular: Major vessels at the base of the brain show flow. Skull and upper cervical spine: Negative Sinuses/Orbits: Paranasal sinuses are clear. Orbits are negative. Previous lens implant on the left. Other: Left mastoid effusion. IMPRESSION: No acute intracranial finding. Mild age related volume loss and minimal small vessel change of the hemispheric white matter. Left mastoid effusion. Electronically Signed   By: Nelson Chimes M.D.   On: 10/30/2020  20:39    EKG: Independently reviewed.  A. fib at 76 with no ST-T wave changes.  Assessment/Plan Principal Problem:   Syncope and collapse Active Problems:   Iron deficiency anemia   Essential hypertension   History of pulmonary embolus (PE)   GERD   OSA (obstructive sleep apnea)   CAD (coronary artery disease)   CKD stage 3 due to type 2 diabetes mellitus (HCC)   Type 2 diabetes mellitus with diabetic chronic kidney disease (HCC) Syncope and collapse: Differentials include CVA/dysrhythmia associated/TIA/orthostatic hypotension. Discussed with patient about differentials and further evaluation after admission. We will start with admitting patient to telemetry unit with continuous cardiac monitoring. Will order carotid Dopplers, 2D echocardiogram, MRI of the brain.  We will also allow for blood pressure goal to be 200's.  With hydralazine as needed blood pressure above 200's. Pt has new a.fib on ekg however is regular on auscultation. Patient has no neurological deficit and his exam is nonfocal otherwise. Will start patient on aspirin 81, atorvastatin 80, hold patient's losartan.  We will also hold patient's Cardura, to allow for permissive hypertension. Will obtain orthostatic vitals.  Iron deficiency anemia:  Resolved.   Hypertension: Blood pressure (!) 200/168, pulse 67, temperature 97.6 F (36.4 C), temperature source Oral, resp. rate 15, height 5\' 4"  (1.626 m), weight 120.5 kg, SpO2 94 %. We will hold patient's doxazosin and losartan to allow for permissive hypertension. 11:25 Pm:  Mri brain [place] is negative for acute cva and we will restart home bp meds and prn hydralazine.   History of PE: Patient states he has never been on blood thinners for his PE, he was in the hospital and was started on blood thinner shots. Will start patient on DVT protocol with heparin. We will also obtain a VQ scan as the likelihood of patient having pulmonary embolism causing syncope and collapse  is also high.   GERD: IV PPI therapy:  Obstructive sleep apnea: Discussed with patient about the importance of using his CPAP to prevent any future cardiac complications and pulmonary complications. Also if he cannot tolerate the mask to consider an oral appliance.   CAD: Can we will continue patient on aspirin, statin therapy. Patient currently stable with no angina symptoms.   CKD: Avoid any contrast studies and we will hold patient's losartan and follow for any improvement.   Diabetes mellitus type 2: Sliding scale insulin, A1c. Hold home regimen of insulin.   DVT prophylaxis:  Heparin    Code Status:  Full code    Family Communication:  Saidi,Priscilla J (Spouse)  [phone] (Home Phone)   Disposition Plan:  Home    Consults called:  None  Admission status: Inpatient.     Para Skeans MD [facility] [phone] How to contact the [facility] Attending or Consulting provider [place] or covering provider during after hours [place], for this patient.    Check the care team in [facility] and look for a) attending/consulting [facility] provider listed and b) the [facility] team listed Log into [email or web address] and use [place]'s universal password to access. If you do not have the password, please contact the hospital operator. Locate the [facility] provider you are looking for under [facility] and page to a number that you can be directly reached. If you still have difficulty reaching the provider, please page the [facility] (Director on Call) for the Hospitalists listed on amion for assistance. [email or web address] Password [email or web address] 10/30/2020, 11:25 PM

## 2020-10-30 NOTE — ED Triage Notes (Signed)
Pt comes with c/o unwitnessed fall. Pt was found in garage and unsure how he got there. Pts BG was 68 per EMs pt then ate sandwich and then it was 75, unable to get IV  New afib dx. VSS. Pt c/o increased weakness as well.

## 2020-10-30 NOTE — ED Provider Notes (Signed)
[facility]  ____________________________________________   Event Date/Time   First MD Initiated Contact with Patient 10/30/20 1607     (approximate)  I have reviewed the triage vital signs and the nursing notes.   HISTORY  Chief Complaint Fall    HPI Donald [name] is a 75 y.o. male with past medical history of asthma, CKD, depression, HFpEF who presents after a fall.  Patient was on his way to physical therapy, the car and then lost consciousness.  He does not remember any prodromal lightheadedness.  He woke up on the floor.  Unsure if he hit his head.  He initially felt weak when he woke up is not able to get up on his own.  He was on the floor for about an hour and his wife came and found him.  He was initially mildly hypoglycemic with EMS to a blood sugar of 68.  Patient denies any chest pain dyspnea lightheadedness.  Denies any history of arrhythmia or atrial fibrillation.  No recent illnesses nausea vomiting or abdominal pain.         Past Medical History:  Diagnosis Date   (HFpEF) heart failure with preserved ejection fraction ([place])    a. 09/2018 Echo: EF 60-65%. PASP 52mmHg. Mild-mod LAE. Mild MR/TR.   ANEMIA-IRON DEFICIENCY 07/26/2006   Anxiety    ASTHMA 07/26/2006   Asthma    Back pain, chronic    Bell's palsy    CKD (chronic kidney disease), stage III ([place])    COLONIC POLYPS, HX OF 07/26/2006   COVID-19 virus infection 01/2020   DEPRESSION 03/14/2009   DIABETES MELLITUS, TYPE II 07/26/2006   [place] DISEASE, LUMBAR 10/05/2007   DVT 12/03/2007   DYSLIPIDEMIA 04/13/2009   Dyspnea    when gets up and walks around and back is hurting really bad -only Shortness of breath  then   GERD 07/26/2006   Gout    Heart murmur    History of kidney stones    HYPERTENSION 07/26/2006   INSOMNIA 08/21/2007   Neuropathy    Nonobstructive CAD (coronary artery disease)    a. 2012 Cath: no high grade stenosis; b. 2018 MV: No ischemia. Attenuation artifact.     OBSTRUCTIVE SLEEP APNEA 12/03/2007   Use C-PAP   PERIPHERAL NEUROPATHY 07/26/2006   Pernicious anemia 11/20/2006   PULMONARY EMBOLISM 10/05/2007    Patient Active Problem List   Diagnosis Date Noted   Hearing loss of right ear due to cerumen impaction 03/18/2020   Tremor 03/18/2020   Cervical radiculopathy 05/19/2019   Heart failure with preserved ejection fraction ([place]) 10/28/2018   Pre-op evaluation 08/12/2018   Carotid stenosis, asymptomatic, bilateral 03/29/2018   Medicare annual wellness visit, subsequent 03/20/2018   Advanced care planning/counseling discussion 03/20/2018   Vitamin D deficiency 03/20/2018   Vitamin B12 deficiency 81/44/8185   Systolic murmur 63/14/9702   Type 2 diabetes mellitus with diabetic chronic kidney disease ([place]) 12/19/2017   Constipation 12/19/2017   Chronic gout 12/19/2017   Diabetic peripheral neuropathy ([place]) 10/06/2017   Carpal tunnel syndrome of left wrist 09/19/2017   PAD (peripheral artery disease) ([place]) 05/07/2017   Upper airway cough syndrome 08/21/2016   S/P parathyroidectomy ([place]) 04/18/2016   Uncontrolled type 2 diabetes mellitus with peripheral neuropathy 03/12/2016   ECG abnormal 02/28/2016   Loss of hearing 05/30/2015   CKD stage 3 due to type 2 diabetes mellitus ([place]) 01/27/2014   Obesity, morbid, BMI 40.0-49.9 ([place]) 03/08/2013   Impotence of organic origin 03/11/2011  CAD (coronary artery disease) 11/19/2010   Recurrent nephrolithiasis 03/26/2010   Acute sinusitis 02/27/2010   HEMATURIA UNSPECIFIED 02/27/2010   Dyslipidemia associated with type 2 diabetes mellitus ([place]) 04/13/2009   ALLERGIC RHINITIS 04/13/2009   History of depression 03/14/2009   PROTEINURIA, MILD 09/16/2008   History of deep vein thrombosis (DVT) of lower extremity 12/03/2007   OSA (obstructive sleep apnea) 12/03/2007   History of pulmonary embolus (PE) 10/05/2007   DISC DISEASE, LUMBAR 10/05/2007   Chronic insomnia 08/21/2007   Pernicious anemia  11/20/2006   Iron deficiency anemia 07/26/2006   Essential hypertension 07/26/2006   Asthmatic bronchitis 07/26/2006   GERD 07/26/2006   COLONIC POLYPS, HX OF 07/26/2006    Past Surgical History:  Procedure Laterality Date   [place], 06/14/2008   Dr. Trenton Gammon at [place] (06/10)   [place] Right 10/2017   CATARACT EXTRACTION W/PHACO Left 05/30/2014   Procedure: CATARACT EXTRACTION PHACO AND INTRAOCULAR LENS PLACEMENT ([place]);  Surgeon: Estill Cotta, MD;  Location: [facility] ORS;  Service: Ophthalmology;  Laterality: Left;  [name] 01:20 AP% 23.7 CDE 31.86   COLONOSCOPY  10/2002   HP, SSA, TA, rpt 3 yrs ([place])   COLONOSCOPY  01/2005   diverticulosis rpt 5 yrs ([place])    IR NEPHROSTOMY PLACEMENT LEFT  10/19/2018   IR URETERAL STENT LEFT NEW ACCESS W/O SEP NEPHROSTOMY CATH  10/19/2018   LITHOTRIPSY     X 2   NEPHROLITHOTOMY Left 10/19/2018   Procedure: NEPHROLITHOTOMY PERCUTANEOUS;  Surgeon: Franchot Gallo, MD;  Location: [facility] ORS;  Service: Urology;  Laterality: Left;  3 HRS   PARATHYROIDECTOMY Right 04/18/2016   PARATHYROIDECTOMY for cyst [name], MD)   SHOULDER ARTHROSCOPY W/ ROTATOR CUFF REPAIR Right    TONSILLECTOMY      Prior to Admission medications   Medication Sig Start Date End Date Taking? Authorizing Provider  albuterol (VENTOLIN HFA) 108 (90 Base) MCG/ACT inhaler Inhale 2 puffs into the lungs every 6 (six) hours as needed for wheezing or shortness of breath. Patient not taking: Reported on 07/31/2020 02/04/20   Ria Bush, MD  allopurinol (ZYLOPRIM) 300 MG tablet Take 1 tablet (300 mg total) by mouth daily. 06/21/20   Ria Bush, MD  ALPRAZolam Duanne Moron) 1 MG tablet TAKE 1 TABLET BY MOUTH AT BEDTIME IF NEEDED FOR SLEEP 10/05/20   Ria Bush, MD  aspirin EC 81 MG tablet Take 1 tablet (81 mg total) by mouth daily. 02/17/19   Ria Bush, MD  Cholecalciferol (VITAMIN D3)  25 MCG (1000 UT) CAPS Take 1 capsule (1,000 Units total) by mouth daily. 03/17/20   Ria Bush, MD  colchicine (COLCRYS) 0.6 MG tablet Take 1 tablet (0.6 mg total) by mouth daily as needed (gout flare). On first day of gout flare may take 2 tablets at once. 05/04/20   Ria Bush, MD  cyanocobalamin (,VITAMIN B-12,) 1000 MCG/ML injection Inject 1 mL (1,000 mcg total) into the muscle every 30 (thirty) days. 03/17/20   Ria Bush, MD  doxazosin (CARDURA) 1 MG tablet Take 1 tablet (1 mg total) by mouth 2 (two) times daily. 04/21/20   Minna Merritts, MD  fluticasone (FLONASE) 50 MCG/ACT nasal spray Place 2 sprays into both nostrils daily. Patient not taking: Reported on 07/31/2020 06/18/19   Elby Beck, FNP  furosemide (LASIX) 80 MG tablet TAKE ONE TABLET (80 MG) BY MOUTH EVERY DAY take an extra lasix for swelling 1-2 a  week 04/21/20   Minna Merritts, MD  gabapentin (NEURONTIN) 600 MG tablet TAKE TWO TABLETS AT BEDTIME 09/19/20   Ria Bush, MD  glucose blood (GE100 BLOOD GLUCOSE TEST) test strip 1 each by Other route 4 (four) times daily. E11.9 07/23/19   Renato Shin, MD  HYDROcodone-acetaminophen [facility]) 10-325 MG tablet Take 1 tablet by mouth every 6 (six) hours as needed for moderate pain ((typically once to twice daily if needed)).  12/19/17   Ria Bush, MD  insulin NPH Human (HUMULIN N) 100 UNIT/ML injection Inject 2.7 mLs (270 Units total) into the skin at bedtime. 07/10/20   Renato Shin, MD  insulin regular (NOVOLIN R RELION) 100 units/mL injection 3 times a day (just before each meal), 210-150-290 units, and syringes 4/day. 11/10/18   Renato Shin, MD  losartan (COZAAR) 25 MG tablet Take 1 tablet (25 mg total) by mouth at bedtime. 04/21/20   Minna Merritts, MD  lovastatin (MEVACOR) 40 MG tablet Take 2 tablets (80 mg total) by mouth at bedtime. 04/21/20   Minna Merritts, MD  Naphazoline-Pheniramine (OPCON-A OP) Place 1 drop into both eyes 3 (three) times daily  as needed (itchy eyes.).     [provider]  ondansetron (ZOFRAN) 4 MG tablet Take 1 tablet (4 mg total) by mouth every 4 (four) hours as needed for nausea. Patient not taking: Reported on 07/31/2020 08/10/18   Lesleigh Noe, MD  Sennosides-Docusate Sodium (STOOL SOFTENER & LAXATIVE PO) Take 2 tablets by mouth at bedtime.    [provider]  Syringe/Needle, Disp, (SYRINGE 3CC/22GX1-1/2") 22G X 1-1/2" 3 ML MISC Use to administer monthly b12 shots 03/18/20   Ria Bush, MD    Allergies Codeine and Pioglitazone  Family History  Problem Relation Age of Onset   Cancer Mother        Breast Cancer   Cancer Sister        Breast Cancer   Diabetes Father    Heart disease Father    Diabetes Paternal Grandfather     Social History Social History   Tobacco Use   Smoking status: Never   Smokeless tobacco: Never  Vaping Use   Vaping Use: Never used  Substance Use Topics   Alcohol use: No   Drug use: No    Review of Systems   Review of Systems  Constitutional:  Negative for chills and fever.  Respiratory:  Negative for chest tightness and shortness of breath.   Cardiovascular:  Negative for chest pain and leg swelling.  Gastrointestinal:  Negative for abdominal pain, nausea and vomiting.  Neurological:  Positive for syncope.  All other systems reviewed and are negative.  Physical Exam Updated Vital Signs BP (!) 158/132   Pulse 70   Temp 97.6 F (36.4 C) (Oral)   Resp 19   Ht 5\' 4"  (1.626 m)   Wt 120.5 kg   SpO2 99%   BMI 45.60 kg/m   Physical Exam Vitals and nursing note reviewed.  Constitutional:      General: He is not in acute distress.    Appearance: Normal appearance.  HENT:     Head: Normocephalic and atraumatic.  Eyes:     General: No scleral icterus.    Conjunctiva/sclera: Conjunctivae normal.  Cardiovascular:     Rate and Rhythm: Normal rate and regular rhythm.  Pulmonary:     Effort: Pulmonary effort is normal. No respiratory  distress.     Breath sounds: Normal breath sounds. No wheezing.  Musculoskeletal:  General: No deformity or signs of injury.     Cervical back: Normal range of motion.  Skin:    Coloration: Skin is not jaundiced or pale.  Neurological:     General: No focal deficit present.     Mental Status: He is alert and oriented to person, place, and time. Mental status is at baseline.  Psychiatric:        Mood and Affect: Mood normal.        Behavior: Behavior normal.     LABS (all labs ordered are listed, but only abnormal results are displayed)  Labs Reviewed  BASIC METABOLIC PANEL - Abnormal; Notable for the following components:      Result Value   Potassium 2.9 (*)    Creatinine, Ser 1.42 (*)    GFR, Estimated 53 (*)    All other components within normal limits  URINALYSIS, COMPLETE (UACMP) WITH MICROSCOPIC - Abnormal; Notable for the following components:   Color, Urine STRAW (*)    APPearance CLEAR (*)    Hgb urine dipstick MODERATE (*)    Protein, ur 30 (*)    All other components within normal limits  CBG MONITORING, ED - Abnormal; Notable for the following components:   Glucose-Capillary 117 (*)    All other components within normal limits  TROPONIN I (HIGH SENSITIVITY) - Abnormal; Notable for the following components:   Troponin I (High Sensitivity) 132 (*)    All other components within normal limits  RESP PANEL BY RT-PCR (FLU A&B, COVID) ARPGX2  CBC  CBG MONITORING, ED   ____________________________________________  EKG  Atrial fibrillation, normal axis, normal intervals, no acute ischemic changes ____________________________________________  RADIOLOGY I, Madelin Headings, personally viewed and evaluated these images (plain radiographs) as part of my medical decision making, as well as reviewing the written report by the radiologist.   ED MD interpretation:  I reviewed the CT scan of the brain which does not show any acute intracranial process       ____________________________________________   PROCEDURES  Procedure(s) performed (including Critical Care):  Procedures   ____________________________________________   INITIAL IMPRESSION / ASSESSMENT AND PLAN / ED COURSE     75 year old male who presents after high risk syncopal episode.  Occurred without any prodrome.  He was walking to the car and then woke up on the ground.  He was initially mildly hypoglycemic however did not feel that this is the cause of his syncope as he was only 70 and he woke up and returned to baseline prior to receiving any glucose.  Vital signs within normal limits.  He appears well on exam.  Does show atrial fibrillation with controlled rate.  This is a new diagnosis for him.  Labs are notable for hypokalemia 2.9 otherwise within normal limits.  Patient's syncopal episode is certainly concerning given the lack of prodrome.  Together with his hypokalemia and new A. fib do feel that he requires admission for telemetry and further work-up.  Patient is agreeable to this.  Will obtain a CT of his head given he had a fall.  Patient's troponin significantly elevated.  He is not having active chest pain.  However further signifies that he had a high risk syncopal episode.  CT head by my read does not show any acute bleed pending radiology read.  Will admit.  Clinical Course as of 10/30/20 1756  Mon Oct 30, 2020  1736 Platelets: 192 [KM]    Clinical Course User Index [KM] Rada Hay, MD  ____________________________________________   FINAL CLINICAL IMPRESSION(S) / ED DIAGNOSES  Final diagnoses:  Syncope, unspecified syncope type     ED Discharge Orders     None        Note:  This document was prepared using Dragon voice recognition software and may include unintentional dictation errors.    Rada Hay, MD 10/30/20 [phone]

## 2020-10-30 NOTE — ED Notes (Signed)
[place] sent to the lab.  

## 2020-10-31 ENCOUNTER — Inpatient Hospital Stay: Payer: Medicare Other

## 2020-10-31 ENCOUNTER — Inpatient Hospital Stay (HOSPITAL_COMMUNITY)
Admit: 2020-10-31 | Discharge: 2020-10-31 | Disposition: A | Discharge status: Home / Self Care | Payer: Medicare Other | Attending: Internal Medicine | Admitting: Internal Medicine

## 2020-10-31 ENCOUNTER — Ambulatory Visit: Payer: Medicare Other | Admitting: Cardiovascular Disease

## 2020-10-31 DIAGNOSIS — R55 Syncope and collapse: Secondary | ICD-10-CM

## 2020-10-31 DIAGNOSIS — I429 Cardiomyopathy, unspecified: Secondary | ICD-10-CM

## 2020-10-31 LAB — LIPID PANEL
Cholesterol: 119 mg/dL (ref 0–200)
HDL: 25 mg/dL — ABNORMAL LOW (ref >40)
LDL Cholesterol: 51 mg/dL (ref 0–99)
Total CHOL/HDL Ratio: 4.8 RATIO
Triglycerides: 214 mg/dL — ABNORMAL HIGH (ref <150)
VLDL: 43 mg/dL — ABNORMAL HIGH (ref 0–40)

## 2020-10-31 LAB — ECHOCARDIOGRAM COMPLETE
AR max vel: 2.87 cm2
AV Area VTI: 2.78 cm2
AV Area mean vel: 2.67 cm2
AV Mean grad: 7 mmHg
AV Peak grad: 11.7 mmHg
Ao pk vel: 1.71 m/s
Area-P 1/2: 3.77 cm2
Calc EF: 35.4 %
Height: 64 in
MV VTI: 3.77 cm2
S' Lateral: 3.93 cm
Single Plane A2C EF: 40.4 %
Single Plane A4C EF: 31 %
Weight: 4250.47 oz

## 2020-10-31 LAB — TSH: TSH: 2.156 u[IU]/mL (ref 0.350–4.500)

## 2020-10-31 LAB — TROPONIN I (HIGH SENSITIVITY)
Troponin I (High Sensitivity): 321 ng/L (ref <18)
Troponin I (High Sensitivity): 288 ng/L (ref <18)

## 2020-10-31 LAB — CBG MONITORING, ED
Glucose-Capillary: 201 mg/dL — ABNORMAL HIGH (ref 70–99)
Glucose-Capillary: 202 mg/dL — ABNORMAL HIGH (ref 70–99)
Glucose-Capillary: 256 mg/dL — ABNORMAL HIGH (ref 70–99)
Glucose-Capillary: 245 mg/dL — ABNORMAL HIGH (ref 70–99)

## 2020-10-31 LAB — BASIC METABOLIC PANEL
Anion gap: 7 (ref 5–15)
BUN: 24 mg/dL — ABNORMAL HIGH (ref 8–23)
CO2: 29 mmol/L (ref 22–32)
Calcium: 8.5 mg/dL — ABNORMAL LOW (ref 8.9–10.3)
Chloride: 102 mmol/L (ref 98–111)
Creatinine, Ser: 1.48 mg/dL — ABNORMAL HIGH (ref 0.61–1.24)
GFR, Estimated: 50 mL/min — ABNORMAL LOW (ref >60)
Glucose, Bld: 286 mg/dL — ABNORMAL HIGH (ref 70–99)
Potassium: 4.2 mmol/L (ref 3.5–5.1)
Sodium: 138 mmol/L (ref 135–145)

## 2020-10-31 LAB — APTT: aPTT: 85 seconds — ABNORMAL HIGH (ref 24–36)

## 2020-10-31 LAB — CBC
HCT: 43.7 % (ref 39.0–52.0)
Hemoglobin: 13.9 g/dL (ref 13.0–17.0)
MCH: 28.1 pg (ref 26.0–34.0)
MCHC: 31.8 g/dL (ref 30.0–36.0)
MCV: 88.5 fL (ref 80.0–100.0)
Platelets: 198 10*3/uL (ref 150–400)
RBC: 4.94 MIL/uL (ref 4.22–5.81)
RDW: 14.5 % (ref 11.5–15.5)
WBC: 6.5 10*3/uL (ref 4.0–10.5)
nRBC: 0 % (ref 0.0–0.2)

## 2020-10-31 LAB — HEPATIC FUNCTION PANEL
ALT: 15 U/L (ref 0–44)
AST: 23 U/L (ref 15–41)
Albumin: 3.1 g/dL — ABNORMAL LOW (ref 3.5–5.0)
Alkaline Phosphatase: 72 U/L (ref 38–126)
Bilirubin, Direct: 0.3 mg/dL — ABNORMAL HIGH (ref 0.0–0.2)
Indirect Bilirubin: 1.4 mg/dL — ABNORMAL HIGH (ref 0.3–0.9)
Total Bilirubin: 1.7 mg/dL — ABNORMAL HIGH (ref 0.3–1.2)
Total Protein: 5.6 g/dL — ABNORMAL LOW (ref 6.5–8.1)

## 2020-10-31 LAB — MAGNESIUM: Magnesium: 1.8 mg/dL (ref 1.7–2.4)

## 2020-10-31 LAB — PROTIME-INR
INR: 1.1 (ref 0.8–1.2)
Prothrombin Time: 14.5 seconds (ref 11.4–15.2)

## 2020-10-31 MED ORDER — TECHNETIUM TO 99M ALBUMIN AGGREGATED
4.0000 | Freq: Once | INTRAVENOUS | Status: AC | PRN
  Administered 2020-10-31: 4.44 via INTRAVENOUS

## 2020-10-31 MED ORDER — HEPARIN BOLUS VIA INFUSION
4400.0000 [IU] | Freq: Once | INTRAVENOUS | Status: AC
  Administered 2020-10-31: 4400 [IU] via INTRAVENOUS
  Filled 2020-10-31: qty 4400

## 2020-10-31 MED ORDER — LOSARTAN POTASSIUM 25 MG PO TABS
25.0000 mg | ORAL_TABLET | Freq: Two times a day (BID) | ORAL | Status: DC
  Administered 2020-10-31 – 2020-11-02 (×4): 25 mg via ORAL
  Filled 2020-10-31 (×4): qty 1

## 2020-10-31 MED ORDER — POTASSIUM CHLORIDE CRYS ER 20 MEQ PO TBCR
40.0000 meq | EXTENDED_RELEASE_TABLET | Freq: Every day | ORAL | Status: DC
  Administered 2020-10-31 – 2020-11-02 (×3): 40 meq via ORAL
  Filled 2020-10-31: qty 4
  Filled 2020-10-31 (×2): qty 2

## 2020-10-31 MED ORDER — HEPARIN (PORCINE) 25000 UT/250ML-% IV SOLN
1650.0000 [IU]/h | INTRAVENOUS | Status: DC
  Administered 2020-10-31: 1350 [IU]/h via INTRAVENOUS
  Filled 2020-10-31 (×2): qty 250

## 2020-10-31 MED ORDER — PERFLUTREN LIPID MICROSPHERE
1.0000 mL | INTRAVENOUS | Status: AC | PRN
  Administered 2020-10-31: 3 mL via INTRAVENOUS
  Filled 2020-10-31: qty 10

## 2020-10-31 MED ORDER — FUROSEMIDE 10 MG/ML IJ SOLN
80.0000 mg | Freq: Every day | INTRAMUSCULAR | Status: DC
  Administered 2020-10-31 – 2020-11-01 (×2): 80 mg via INTRAVENOUS
  Filled 2020-10-31 (×2): qty 8

## 2020-10-31 MED ORDER — FUROSEMIDE 40 MG PO TABS: 80.0000 mg | ORAL_TABLET | Freq: Every day | ORAL | Status: DC

## 2020-10-31 MED ORDER — CARVEDILOL 3.125 MG PO TABS
3.1250 mg | ORAL_TABLET | Freq: Two times a day (BID) | ORAL | Status: DC
  Administered 2020-10-31 – 2020-11-02 (×3): 3.125 mg via ORAL
  Filled 2020-10-31 (×3): qty 1

## 2020-10-31 MED ORDER — POTASSIUM CHLORIDE CRYS ER 20 MEQ PO TBCR: 40.0000 meq | EXTENDED_RELEASE_TABLET | Freq: Every day | ORAL | Status: DC

## 2020-10-31 NOTE — Progress Notes (Signed)
*  PRELIMINARY RESULTS* Echocardiogram 2D Echocardiogram has been performed.  Donald [name] Donald [name] 10/31/2020, 10:58 AM

## 2020-10-31 NOTE — Consult Note (Signed)
ANTICOAGULATION CONSULT NOTE - Initial Consult  Pharmacy Consult for heparin Indication: atrial fibrillation  Allergies  Allergen Reactions   Codeine Other (See Comments)    REACTION: chest pain   Pioglitazone Other (See Comments)    REACTION to Actos: swelling in ankles    Patient Measurements: Height: 5\' 4"  (162.6 cm) Weight: 120.5 kg (265 lb 10.5 oz) IBW/kg (Calculated) : 59.2 Heparin Dosing Weight: 88kg  Vital Signs: BP: 163/55 (10/18 1300) Pulse Rate: 68 (10/18 1300)  Labs: Recent Labs    10/30/20 1413 10/31/20 0934 10/31/20 1058 10/31/20 1355  HGB 15.4  --   --   --   HCT 45.6  --   --   --   PLT 192  --   --   --   CREATININE 1.42*  --   --  1.48*  TROPONINIHS 132* 321* 288*  --     Estimated Creatinine Clearance: 53.4 mL/min (A) (by C-G formula based on SCr of 1.48 mg/dL (H)).   Medical History: Past Medical History:  Diagnosis Date   (HFpEF) heart failure with preserved ejection fraction ([place])    a. 09/2018 Echo: EF 60-65%. PASP 108mmHg. Mild-mod LAE. Mild MR/TR.   ANEMIA-IRON DEFICIENCY 07/26/2006   Anxiety    ASTHMA 07/26/2006   Asthma    Back pain, chronic    Bell's palsy    CKD (chronic kidney disease), stage III ([place])    COLONIC POLYPS, HX OF 07/26/2006   COVID-19 virus infection 01/2020   DEPRESSION 03/14/2009   DIABETES MELLITUS, TYPE II 07/26/2006   [place] DISEASE, LUMBAR 10/05/2007   DVT 12/03/2007   DYSLIPIDEMIA 04/13/2009   Dyspnea    when gets up and walks around and back is hurting really bad -only Shortness of breath  then   GERD 07/26/2006   Gout    Heart murmur    History of kidney stones    HYPERTENSION 07/26/2006   INSOMNIA 08/21/2007   Neuropathy    Nonobstructive CAD (coronary artery disease)    a. 2012 Cath: no high grade stenosis; b. 2018 MV: No ischemia. Attenuation artifact.    OBSTRUCTIVE SLEEP APNEA 12/03/2007   Use C-PAP   PERIPHERAL NEUROPATHY 07/26/2006   Pernicious anemia 11/20/2006   PULMONARY EMBOLISM 10/05/2007     Medications:  Scheduled:   allopurinol  300 mg Oral Daily   aspirin EC  81 mg Oral Daily   atorvastatin  80 mg Oral Daily   carvedilol  3.125 mg Oral BID WC   furosemide  80 mg Intravenous Daily   gabapentin  1,200 mg Oral QHS   heparin  4,400 Units Intravenous Once   insulin aspart  0-15 Units Subcutaneous TID WC   losartan  25 mg Oral BID   potassium chloride  40 mEq Oral Daily   sodium chloride flush  3 mL Intravenous Q12H   sodium chloride flush  3 mL Intravenous Q12H    Assessment: 75yo M with PMH of HFpEF, asthma, CKD, depression, DM, HLD, GERD, HTN, CAD, PE in 2009 who was admitted for syncope. Pt was found to be in afib and the pharmacy was consulted for heparin dosing.   Baseline CBC, PT/INR/aPTT ordered  No prior AC noted on PTA med list  Goal of Therapy:  Heparin level 0.3-0.7 units/ml Monitor platelets by anticoagulation protocol: Yes   Plan:  Give 4400 units bolus x 1 Start heparin infusion at 1350 units/hr Check HL level in 8 hours Continue to monitor H&H and platelets with daily  CBC while on heparin   Narda Rutherford, PharmD Pharmacy Resident  10/31/2020 3:36 PM

## 2020-10-31 NOTE — Consult Note (Addendum)
Cardiology Consultation:   Patient ID: [name] MRN: [id number]; DOB: Oct 02, 1945  Admit date: 10/30/2020 Date of Consult: 10/31/2020  PCP:  Ria Bush, [facility]  Cardiologist:  Ida Rogue, MD  Advanced Practice Provider:  No care team member to display Electrophysiologist:  None [id number]    Patient Profile:   Donald [name] is a 75 y.o. male with a hx of HFpEF (EF 60%-65%, 2012), nonobstructive coronary artery disease (cath 2012 with no critical high grade obstructive dz, elevated LVEDP), poorly controlled and insulin dependent diabetes, hyperlipidemia, PAD, 2009 DVT and extensive bilateral pulmonary embolism, CKD 3, OSA on CPAP, asthmatic bronchitis, morbid obesity, gout, GERD, anemia, history of hematuria, s/p parathyroidectomy (right sided, 2018), hypercalcemia, chronic back pain s/p surgeries, and who is being seen today for the evaluation of syncope at the request of Dr. Kurtis Bushman.  History of Present Illness:   Donald [name] is a 75 yo male with complex PMH as above including nonobstructive CAD, poorly controlled and insulin dependent DM2, HTN, OSA on CPAP, history of DVT/bilateral PE, and HFpEF.  In September 2009, he had a left sided DVT and extensive bilateral PE that was felt to be idipathic. He remained on coumadin until October 2010 with follow-up CTA negative for PE but that showed coronary artery calcification and cardiomegaly. He had a subsequent 2012 echocardiogram that showed EF 60-70%, mild LVH, mild LAE/RAE, and was without wall motion abnormalities. After an abnormal EKG in 2018, he had a stress test that was without significant ischemia.    He was last seen in office 10/08/2018 after presenting earlier that month for preoperative evaluation before a urology procedure and found to be volume overloaded with diuresis increased for 3 days.  Echo was obtained and showed EF 60 to 65%.  With diuresis, he reported improvement in  lower extremity edema and wt decreased 4 pounds.  Labs showed improvement in renal function.  At follow-up, he still noted chronic dyspnea.  It was noted that he was not checking his BP at home as recommended and was eating out most meals during the week.  Amlodipine was increased to 5 mg daily.  Leading up to his most recent admission, he denies any increase from his baseline chronic dyspnea.  He reports ongoing lower extremity edema but not increased from baseline.  He is unable to lay flat, though he states this is due to back pain.  He has chronic weakness due to deconditioning and notes a recent episode where he slipped off the bed due to  "slippery shorts." No chest pain or tachypalpitations.  He reported dizziness only when standing too quickly and resolving shortly thereafter. He was only occasionally monitoring his BP, usually with SBP 140-160s. He continued to eat out regularly at [employer].  No signs or symptoms consistent with bleeding. He notes CPAP compliance. He reports medication compliance but that he recently saw his nephrologist with recommendation that he stop his potassium supplement.  Today he is seen at [facility] after he presented 10/17 following a syncopal episode that occurred earlier that day while walking from the house to his car to go to physical therapy and at approximately 11:30 AM.  He denies any prodromal symptoms before this event. No chest pain, shortness of breath, racing heart rate, or presyncope.  He had breakfast earlier that day with sausage and grits.  He drank a soda but did not have any water. Prior to the episode, he remembers calling his wife.  He then went through the back door of his house to get to his car.  The next thing he remembers is waking up on the concrete.  He denies ever experiencing something like this before in the past.  He does not know if he hit his head.  He checked his watch, and is able to say that he was unconscious for 30 minutes.  He denies any chest  pain, racing heart rate, or grogginess after his syncopal episode.  He did report weakness and difficulty getting back on his feet, but he reports this is a chronic finding.  He was able to get back on his feet with both the help of his wife and son.  EMS was then called and informed him that his glucose was low and in the 60s.  He was brought to [facility] ED. In the ED, BP 158/132, HR 70 bpm.  Labs showed potassium 2.9, labs were also notable for sodium 141, creatinine 1.42, BUN 23, hemoglobin 15.4, hematocrit 45.6, D-dimer 0.96, TSH 2.448, free T4 1.06. Respiratory panel negative. EKG showed new atrial fibrillation.  UA showed Hgb.  High-sensitivity troponin 132  321  288.  Head CT / MRI without acute changes. VQ scan with low probability for PE. Echo with EF 45-50%.    Past Medical History:  Diagnosis Date   (HFpEF) heart failure with preserved ejection fraction ([place])    a. 09/2018 Echo: EF 60-65%. PASP 54mmHg. Mild-mod LAE. Mild MR/TR.   ANEMIA-IRON DEFICIENCY 07/26/2006   Anxiety    ASTHMA 07/26/2006   Asthma    Back pain, chronic    Bell's palsy    CKD (chronic kidney disease), stage III ([place])    COLONIC POLYPS, HX OF 07/26/2006   COVID-19 virus infection 01/2020   DEPRESSION 03/14/2009   DIABETES MELLITUS, TYPE II 07/26/2006   [place] DISEASE, LUMBAR 10/05/2007   DVT 12/03/2007   DYSLIPIDEMIA 04/13/2009   Dyspnea    when gets up and walks around and back is hurting really bad -only Shortness of breath  then   GERD 07/26/2006   Gout    Heart murmur    History of kidney stones    HYPERTENSION 07/26/2006   INSOMNIA 08/21/2007   Neuropathy    Nonobstructive CAD (coronary artery disease)    a. 2012 Cath: no high grade stenosis; b. 2018 MV: No ischemia. Attenuation artifact.    OBSTRUCTIVE SLEEP APNEA 12/03/2007   Use C-PAP   PERIPHERAL NEUROPATHY 07/26/2006   Pernicious anemia 11/20/2006   PULMONARY EMBOLISM 10/05/2007    Past Surgical History:  Procedure Laterality Date   [place], 06/14/2008   Dr. Trenton Gammon at [facility] [id number])   [place] Right 10/2017   CATARACT EXTRACTION W/PHACO Left 05/30/2014   Procedure: CATARACT EXTRACTION PHACO AND INTRAOCULAR LENS PLACEMENT ([place]);  Surgeon: Estill Cotta, MD;  Location: [facility] ORS;  Service: Ophthalmology;  Laterality: Left;  [name] 01:20 AP% 23.7 CDE 31.86   COLONOSCOPY  10/2002   HP, SSA, TA, rpt 3 yrs ([place])   COLONOSCOPY  01/2005   diverticulosis rpt 5 yrs ([place])    IR NEPHROSTOMY PLACEMENT LEFT  10/19/2018   IR URETERAL STENT LEFT NEW ACCESS W/O SEP NEPHROSTOMY CATH  10/19/2018   LITHOTRIPSY     X 2   NEPHROLITHOTOMY Left 10/19/2018   Procedure: NEPHROLITHOTOMY PERCUTANEOUS;  Surgeon: Franchot Gallo, MD;  Location: [facility] ORS;  Service: Urology;  Laterality:  Left;  3 HRS   PARATHYROIDECTOMY Right 04/18/2016   PARATHYROIDECTOMY for cyst [name], MD)   SHOULDER ARTHROSCOPY W/ ROTATOR CUFF REPAIR Right    TONSILLECTOMY       Home Medications:  Prior to Admission medications   Medication Sig Start Date End Date Taking? Authorizing Provider  allopurinol (ZYLOPRIM) 300 MG tablet Take 1 tablet (300 mg total) by mouth daily. 06/21/20  Yes Ria Bush, MD  Cholecalciferol (VITAMIN D3) 25 MCG (1000 UT) CAPS Take 1 capsule (1,000 Units total) by mouth daily. 03/17/20  Yes Ria Bush, MD  cyanocobalamin (,VITAMIN B-12,) 1000 MCG/ML injection Inject 1 mL (1,000 mcg total) into the muscle every 30 (thirty) days. 03/17/20  Yes Ria Bush, MD  doxazosin (CARDURA) 1 MG tablet Take 1 tablet (1 mg total) by mouth 2 (two) times daily. 04/21/20  Yes Gollan, Kathlene November, MD  furosemide (LASIX) 80 MG tablet TAKE ONE TABLET (80 MG) BY MOUTH EVERY DAY take an extra lasix for swelling 1-2 a week 04/21/20  Yes Gollan, Kathlene November, MD  insulin regular (NOVOLIN R RELION) 100 units/mL injection 3 times a day (just before each meal), 210-150-290 units, and syringes  4/day. 11/10/18  Yes Renato Shin, MD  albuterol (VENTOLIN HFA) 108 (90 Base) MCG/ACT inhaler Inhale 2 puffs into the lungs every 6 (six) hours as needed for wheezing or shortness of breath. Patient not taking: No sig reported 02/04/20   Ria Bush, MD  ALPRAZolam Duanne Moron) 1 MG tablet TAKE 1 TABLET BY MOUTH AT BEDTIME IF NEEDED FOR SLEEP 10/05/20   Ria Bush, MD  aspirin EC 81 MG tablet Take 1 tablet (81 mg total) by mouth daily. 02/17/19   Ria Bush, MD  colchicine (COLCRYS) 0.6 MG tablet Take 1 tablet (0.6 mg total) by mouth daily as needed (gout flare). On first day of gout flare may take 2 tablets at once. 05/04/20   Ria Bush, MD  cyclobenzaprine (FLEXERIL) 10 MG tablet Take 10 mg by mouth 3 (three) times daily as needed for muscle spasms. 10/13/20   [provider]  fluticasone (FLONASE) 50 MCG/ACT nasal spray Place 2 sprays into both nostrils daily. 06/18/19   Elby Beck, FNP  gabapentin (NEURONTIN) 600 MG tablet TAKE TWO TABLETS AT BEDTIME 09/19/20   Ria Bush, MD  glucose blood (GE100 BLOOD GLUCOSE TEST) test strip 1 each by Other route 4 (four) times daily. E11.9 07/23/19   Renato Shin, MD  HYDROcodone-acetaminophen [facility]) 10-325 MG tablet Take 1 tablet by mouth every 6 (six) hours as needed for moderate pain ((typically once to twice daily if needed)).  12/19/17   Ria Bush, MD  insulin NPH Human (HUMULIN N) 100 UNIT/ML injection Inject 2.7 mLs (270 Units total) into the skin at bedtime. 07/10/20   Renato Shin, MD  losartan (COZAAR) 25 MG tablet Take 1 tablet (25 mg total) by mouth at bedtime. 04/21/20   Minna Merritts, MD  lovastatin (MEVACOR) 40 MG tablet Take 2 tablets (80 mg total) by mouth at bedtime. 04/21/20   Minna Merritts, MD  Naphazoline-Pheniramine (OPCON-A OP) Place 1 drop into both eyes 3 (three) times daily as needed (itchy eyes.).     [provider]  ondansetron (ZOFRAN) 4 MG tablet Take 1 tablet (4 mg total)  by mouth every 4 (four) hours as needed for nausea. Patient not taking: Reported on 07/31/2020 08/10/18   Lesleigh Noe, MD  Sennosides-Docusate Sodium (STOOL SOFTENER & LAXATIVE PO) Take 2 tablets by mouth at  bedtime.    [provider]  Syringe/Needle, Disp, (SYRINGE 3CC/22GX1-1/2") 22G X 1-1/2" 3 ML MISC Use to administer monthly b12 shots 03/18/20   Ria Bush, MD    Inpatient Medications: Scheduled Meds:  allopurinol  300 mg Oral Daily   aspirin EC  81 mg Oral Daily   atorvastatin  80 mg Oral Daily   doxazosin  1 mg Oral BID   gabapentin  1,200 mg Oral QHS   heparin  5,000 Units Subcutaneous Q8H   insulin aspart  0-15 Units Subcutaneous TID WC   losartan  25 mg Oral QHS   sodium chloride flush  3 mL Intravenous Q12H   sodium chloride flush  3 mL Intravenous Q12H   Continuous Infusions:  sodium chloride     PRN Meds: sodium chloride, acetaminophen **OR** acetaminophen, colchicine, hydrALAZINE, HYDROcodone-acetaminophen, Naphazoline-Pheniramine, sodium chloride flush  Allergies:    Allergies  Allergen Reactions   Codeine Other (See Comments)    REACTION: chest pain   Pioglitazone Other (See Comments)    REACTION to Actos: swelling in ankles    Social History:   Social History   Socioeconomic History   Marital status: Married    Spouse name: Not on file   Number of children: Not on file   Years of education: Not on file   Highest education level: Not on file  Occupational History   Occupation: Disabled    Employer: [employer]  Tobacco Use   Smoking status: Never   Smokeless tobacco: Never  Vaping Use   Vaping Use: Never used  Substance and Sexual Activity   Alcohol use: No   Drug use: No   Sexual activity: Not on file  Other Topics Concern   Not on file  Social History Narrative   Married   Children   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   Activity is severely limited by medical problems   Diet is "good".   Never a smoker    Alcohol: none         Social Determinants of Radio broadcast assistant Strain: Medium Risk   Difficulty of Paying Living Expenses: Somewhat hard  Food Insecurity: Not on file  Transportation Needs: Not on file  Physical Activity: Not on file  Stress: Not on file  Social Connections: Not on file  Intimate Partner Violence: Not on file    Family History:    Family History  Problem Relation Age of Onset   Cancer Mother        Breast Cancer   Cancer Sister        Breast Cancer   Diabetes Father    Heart disease Father    Diabetes Paternal Grandfather      ROS:  Please see the history of present illness.  Review of Systems  Respiratory:  Negative for hemoptysis and shortness of breath.        Chronic DOE  Cardiovascular:  Positive for leg swelling. Negative for chest pain, palpitations, orthopnea and PND.  Gastrointestinal:  Negative for blood in stool and melena.  Genitourinary:  Negative for hematuria.  Musculoskeletal:  Positive for back pain, falls and neck pain.  Neurological:  Positive for dizziness, loss of consciousness and weakness. Negative for focal weakness.       Chronic weakness, chronic dizziness when standing  All other systems reviewed and are negative.  All other ROS reviewed and negative.     Physical Exam/Data:   Vitals:   10/31/20 1000 10/31/20 1200  10/31/20 1230 10/31/20 1300  BP: (!) 171/62 (!) 124/49  (!) 163/55  Pulse: (!) 57 60  68  Resp: (!) 22  20 19   Temp:      TempSrc:      SpO2: 95% 92% 93% 96%  Weight:      Height:       No intake or output data in the 24 hours ending 10/31/20 1332 Last 3 Weights 10/30/2020 07/10/2020 06/21/2020  Weight (lbs) 265 lb 10.5 oz 265 lb 9.6 oz 267 lb 4 oz  Weight (kg) 120.5 kg 120.475 kg 121.224 kg     Body mass index is 45.6 kg/m.  General: Obese male, NAD HEENT: normal Neck: JVP difficult to assess due to body habitus Vascular: No carotid bruits; radial pulses 2+ bilaterally Cardiac:  normal  S1, S2; RRR; 2/6 systolic murmur Lungs: Bibasilar crackles, worse on the left  Abd: Obese, nontender, no hepatomegaly  Ext: Moderate bilateral lower extremity edema Musculoskeletal:  No deformities, BUE and BLE strength normal and equal Skin: warm and dry  Neuro:  CNs 2-12 intact, no focal abnormalities noted Psych:  Normal affect   EKG:  The EKG was personally reviewed and demonstrates:  Afib (new), 76bpm,  Telemetry:  Telemetry was personally reviewed and demonstrates: NSR, PACs, PVCs  Relevant CV Studies: Echo 10/31/20  1. Left ventricular ejection fraction, by estimation, is 45 to 50%. The  left ventricle has mildly decreased function. The left ventricle  demonstrates global hypokinesis. There is moderate left ventricular  hypertrophy. Left ventricular diastolic  parameters are consistent with Grade III diastolic dysfunction  (restrictive). Elevated left atrial pressure.   2. Right ventricular systolic function is normal. The right ventricular  size is normal.   3. Left atrial size was mildly dilated.   4. The mitral valve is degenerative. Mild mitral valve regurgitation. No  evidence of mitral stenosis.   5. The aortic valve has an indeterminant number of cusps. There is mild  calcification of the aortic valve. There is moderate thickening of the  aortic valve. Aortic valve regurgitation is trivial. Mild to moderate  aortic valve sclerosis/calcification is  present, without any evidence of aortic stenosis.   MPI 02/2016 Pharmacological myocardial perfusion imaging study with no significant  ischemia There is a small region of mild fixed perfusion defect in the inferoapical/apical region. Unable to exclude small region of old infarct vs attenuation artifact. Nonattenuation corrected images with large region of fixed inferior wall perfusion defect consistent with diaphragmatic attenuation artifact. Small region of possible hypokinesis/wall motion abnormality in the basal  inferoseptal wall, EF estimated at 40% No EKG changes concerning for ischemia at peak stress or in recovery. Resting EKG with T wave abn in V5 and V6 Low risk scan    Laboratory Data:  High Sensitivity Troponin:   Recent Labs  Lab 10/30/20 1413 10/31/20 0934 10/31/20 1058  TROPONINIHS 132* 321* 288*     Chemistry Recent Labs  Lab 10/30/20 1413  NA 141  K 2.9*  CL 98  CO2 29  GLUCOSE 88  BUN 23  CREATININE 1.42*  CALCIUM 9.3  GFRNONAA 53*  ANIONGAP 14    No results for input(s): PROT, ALBUMIN, AST, ALT, ALKPHOS, BILITOT in the last 168 hours. Hematology Recent Labs  Lab 10/30/20 1413  WBC 9.8  RBC 5.16  HGB 15.4  HCT 45.6  MCV 88.4  MCH 29.8  MCHC 33.8  RDW 14.4  PLT 192   BNPNo results for input(s): BNP, PROBNP in the  last 168 hours.  DDimer  Recent Labs  Lab 10/30/20 1413  DDIMER 0.96*     Radiology/Studies:  DG Chest 1 View  Result Date: 10/31/2020 CLINICAL DATA:  History of asthma, heart failure presents after fall EXAM: CHEST  1 VIEW COMPARISON:  Chest radiograph 08/12/2018 FINDINGS: The cardiomediastinal silhouette is normal. There is no focal consolidation or pulmonary edema. There is no pleural effusion or pneumothorax. There is no acute osseous abnormality. IMPRESSION: No radiographic evidence of acute cardiopulmonary process. Electronically Signed   By: Valetta Mole M.D.   On: 10/31/2020 11:10   CT HEAD WO CONTRAST (5MM)  Result Date: 10/30/2020 CLINICAL DATA:  Head trauma, minor (Age >= 65y).  Unwitnessed fall. EXAM: CT HEAD WITHOUT CONTRAST TECHNIQUE: Contiguous axial images were obtained from the base of the skull through the vertex without intravenous contrast. COMPARISON:  None. FINDINGS: Brain: No acute intracranial abnormality. Specifically, no hemorrhage, hydrocephalus, mass lesion, acute infarction, or significant intracranial injury. Vascular: No hyperdense vessel or unexpected calcification. Skull: No acute calvarial abnormality.  Sinuses/Orbits: No acute findings Other: None IMPRESSION: No acute intracranial abnormality. Electronically Signed   By: Rolm Baptise M.D.   On: 10/30/2020 17:56   MR BRAIN WO CONTRAST  Result Date: 10/30/2020 CLINICAL DATA:  Syncopal episode. EXAM: MRI HEAD WITHOUT CONTRAST TECHNIQUE: Multiplanar, multiecho pulse sequences of the brain and surrounding structures were obtained without intravenous contrast. COMPARISON:  Head CT earlier same day. FINDINGS: Brain: Diffusion imaging does not show any acute or subacute infarction. No focal abnormality affects the brainstem or cerebellum. Cerebral hemispheres show mild age related volume loss with minimal small vessel change of the white matter, less than often seen at this age. No cortical or large vessel territory infarction. No mass lesion, hemorrhage, hydrocephalus or extra-axial collection. Vascular: Major vessels at the base of the brain show flow. Skull and upper cervical spine: Negative Sinuses/Orbits: Paranasal sinuses are clear. Orbits are negative. Previous lens implant on the left. Other: Left mastoid effusion. IMPRESSION: No acute intracranial finding. Mild age related volume loss and minimal small vessel change of the hemispheric white matter. Left mastoid effusion. Electronically Signed   By: Nelson Chimes M.D.   On: 10/30/2020 20:39   NM Pulmonary Perfusion  Result Date: 10/31/2020 CLINICAL DATA:  PE suspected EXAM: NUCLEAR MEDICINE PERFUSION LUNG SCAN TECHNIQUE: Perfusion images were obtained in multiple projections after intravenous injection of radiopharmaceutical. Ventilation scans intentionally deferred if perfusion scan and chest x-ray adequate for interpretation during COVID 19 epidemic. RADIOPHARMACEUTICALS:  4.44 mCi Tc-41m MAA IV COMPARISON:  Same-day chest radiographs FINDINGS: Normal, homogeneous perfusion of the lungs. No suspicious filling defects. IMPRESSION: Very low probability examination for pulmonary embolism by modified  perfusion only PIOPED criteria (PE absent). Electronically Signed   By: Delanna Ahmadi M.D.   On: 10/31/2020 11:03   US Carotid Bilateral  Result Date: 10/31/2020 CLINICAL DATA:  Syncope and collapse EXAM: BILATERAL CAROTID DUPLEX ULTRASOUND TECHNIQUE: Pearline Cables scale imaging, color Doppler and duplex ultrasound were performed of bilateral carotid and vertebral arteries in the neck. COMPARISON:  None. FINDINGS: Criteria: Quantification of carotid stenosis is based on velocity parameters that correlate the residual internal carotid diameter with NASCET-based stenosis levels, using the diameter of the distal internal carotid lumen as the denominator for stenosis measurement. The following velocity measurements were obtained: RIGHT ICA: 68 cm/sec CCA: 85 cm/sec SYSTOLIC ICA/CCA RATIO:  0.9 ECA: 170 cm/sec LEFT ICA: 75 cm/sec CCA: 82 cm/sec SYSTOLIC ICA/CCA RATIO:  1.1 ECA: 136 cm/sec RIGHT CAROTID  ARTERY: Antegrade RIGHT VERTEBRAL ARTERY:  Antegrade LEFT CAROTID ARTERY:  Antegrade LEFT VERTEBRAL ARTERY:  Antegrade Upper extremity blood pressures: Not provided Mild plaque in the distal common carotid arteries bilaterally, as well as at the bifurcation. IMPRESSION: Velocities consistent with less than 50% stenosis in the bilateral carotid arteries. Electronically Signed   By: Merilyn Baba M.D.   On: 10/31/2020 01:49   ECHOCARDIOGRAM COMPLETE  Result Date: 10/31/2020    ECHOCARDIOGRAM REPORT   Patient Name:   JAROM GOVAN Date of Exam: 10/31/2020 Medical Rec #:  [id number]         Height:       64.0 in Accession #:    [id number]        Weight:       265.7 lb Date of Birth:  Jul 04, 1945        BSA:          2.207 m Patient Age:    21 years          BP:           143/54 mmHg Patient Gender: M                 HR:           56 bpm. Exam Location:  [facility] Procedure: 2D Echo, Color Doppler, Cardiac Doppler and Intracardiac            Opacification Agent Indications:     R55 Syncope  History:         Patient has prior  history of Echocardiogram examinations, most                  recent 10/01/2018. HFrEF; Risk Factors:Hypertension, Diabetes                  and Sleep Apnea.  Sonographer:     Charmayne Sheer Referring Phys:  [id number] Gretta Cool PATEL Diagnosing Phys: Nelva Bush MD  Sonographer Comments: Suboptimal apical window and suboptimal subcostal window. IMPRESSIONS  1. Left ventricular ejection fraction, by estimation, is 45 to 50%. The left ventricle has mildly decreased function. The left ventricle demonstrates global hypokinesis. There is moderate left ventricular hypertrophy. Left ventricular diastolic parameters are consistent with Grade III diastolic dysfunction (restrictive). Elevated left atrial pressure.  2. Right ventricular systolic function is normal. The right ventricular size is normal.  3. Left atrial size was mildly dilated.  4. The mitral valve is degenerative. Mild mitral valve regurgitation. No evidence of mitral stenosis.  5. The aortic valve has an indeterminant number of cusps. There is mild calcification of the aortic valve. There is moderate thickening of the aortic valve. Aortic valve regurgitation is trivial. Mild to moderate aortic valve sclerosis/calcification is present, without any evidence of aortic stenosis. FINDINGS  Left Ventricle: Left ventricular ejection fraction, by estimation, is 45 to 50%. The left ventricle has mildly decreased function. The left ventricle demonstrates global hypokinesis. Definity contrast agent was given IV to delineate the left ventricular  endocardial borders. The left ventricular internal cavity size was normal in size. There is moderate left ventricular hypertrophy. Left ventricular diastolic parameters are consistent with Grade III diastolic dysfunction (restrictive). Elevated left atrial pressure. Right Ventricle: The right ventricular size is normal. No increase in right ventricular wall thickness. Right ventricular systolic function is normal. Left Atrium: Left  atrial size was mildly dilated. Right Atrium: Right atrial size was not well visualized. Pericardium: The pericardium was not well visualized. Mitral Valve: The mitral  valve is degenerative in appearance. There is mild thickening of the mitral valve leaflet(s). There is mild calcification of the mitral valve leaflet(s). Mild mitral valve regurgitation. No evidence of mitral valve stenosis. MV peak gradient, 4.8 mmHg. The mean mitral valve gradient is 1.0 mmHg. Tricuspid Valve: The tricuspid valve is not well visualized. Tricuspid valve regurgitation is not demonstrated. Aortic Valve: The aortic valve has an indeterminant number of cusps. There is mild calcification of the aortic valve. There is moderate thickening of the aortic valve. There is mild aortic valve annular calcification. Aortic valve regurgitation is trivial. Mild to moderate aortic valve sclerosis/calcification is present, without any evidence of aortic stenosis. Aortic valve mean gradient measures 7.0 mmHg. Aortic valve peak gradient measures 11.7 mmHg. Aortic valve area, by VTI measures 2.78 cm. Pulmonic Valve: The pulmonic valve was not well visualized. Pulmonic valve regurgitation is not visualized. No evidence of pulmonic stenosis. Aorta: The aortic root is normal in size and structure. Pulmonary Artery: The pulmonary artery is of normal size. IAS/Shunts: The interatrial septum was not well visualized.  LEFT VENTRICLE PLAX 2D LVIDd:         5.10 cm      Diastology LVIDs:         3.93 cm      LV e' medial:   6.31 cm/s LV PW:         1.53 cm      LV E/e' medial: 17.0 LV IVS:        1.31 cm LVOT diam:     2.20 cm LV SV:         108 LV SV Index:   49 LVOT Area:     3.80 cm  LV Volumes (MOD) LV vol d, MOD A2C: 158.0 ml LV vol d, MOD A4C: 187.0 ml LV vol s, MOD A2C: 94.2 ml LV vol s, MOD A4C: 129.0 ml LV SV MOD A2C:     63.8 ml LV SV MOD A4C:     187.0 ml LV SV MOD BP:      62.0 ml LEFT ATRIUM             Index LA diam:        4.10 cm 1.86 cm/m LA  Vol (A2C):   67.8 ml 30.72 ml/m LA Vol (A4C):   67.2 ml 30.45 ml/m LA Biplane Vol: 70.9 ml 32.12 ml/m  AORTIC VALVE                     PULMONIC VALVE AV Area (Vmax):    2.87 cm      PV Vmax:       1.00 m/s AV Area (Vmean):   2.67 cm      PV Vmean:      68.300 cm/s AV Area (VTI):     2.78 cm      PV VTI:        0.203 m AV Vmax:           171.00 cm/s   PV Peak grad:  4.0 mmHg AV Vmean:          126.000 cm/s  PV Mean grad:  2.0 mmHg AV VTI:            0.388 m AV Peak Grad:      11.7 mmHg AV Mean Grad:      7.0 mmHg LVOT Vmax:         129.00 cm/s LVOT Vmean:  88.600 cm/s LVOT VTI:          0.284 m LVOT/AV VTI ratio: 0.73  AORTA Ao Root diam: 3.30 cm MITRAL VALVE MV Area (PHT): 3.77 cm     SHUNTS MV Area VTI:   3.77 cm     Systemic VTI:  0.28 m MV Peak grad:  4.8 mmHg     Systemic Diam: 2.20 cm MV Mean grad:  1.0 mmHg MV Vmax:       1.09 m/s MV Vmean:      54.1 cm/s MV Decel Time: 201 msec MV E velocity: 107.00 cm/s MV A velocity: 39.80 cm/s MV E/A ratio:  2.69 Harrell Gave End MD Electronically signed by Nelva Bush MD Signature Date/Time: 10/31/2020/12:38:25 PM    Final      Assessment and Plan:   Syncopal episode  -- Reports a syncopal episode 10/17 on his way to physical therapy and with LOC for 30 minutes.  At presentation to the ED, he was hypokalemic. Pt reports stopping his potassium supplement but continuing on Lasix 80 mg daily. EKG at presentation showed new atrial fibrillation.  S/p K+ repletion, he is back in NSR.  Echo EF 45 to 50% and recommendations as below.   VQ scan performed for syncope with h/o PE in the past and with low suspicion for PE.  No evidence of right heart strain on echo. Bilateral carotids with less than 50% stenosis.  Orthostatics ordered, pending.  He does not remember if he hit his head when he lost consciousness with CT head/MRI brain without acute changes.    Recent syncopal episode after stopping his potassium supplementation with EKG showing Afib on  presentation and newly reduced EF as well as elevated Tn.  K repleted. Further workup of reduced EF and elevated HS Tn as below. Continue to monitor on telemetry. Maintain electrolytes at goal.  New onset atrial fibrillation  --Currently in sinus rhythm with repeat EKG pending.  Presented in atrial fibrillation of unknown chronicity.  Suspect atrial fibrillation likely triggered by hypokalemia.  Recommend daily BMET.  Will check TSH. He does have a history of sleep apnea with ongoing CPAP use encouraged.  Started low-dose Coreg with hold parameters.  Started IV heparin with daily CBC ordered.  Initial H&H stable.  Given his history of hematuria and recent UA, recommend monitoring blood counts closely on IV heparin.  CHA2DS2VASc score of at least 5 (CHF, HTN, DM2, agex1, vascular) 1 to recommend transitioning to NOAC before discharge.   New systolic heart failure with known diastolic heart failure -- Reports chronic dyspnea.  Echo EF 45 to 50% with LV global hypokinesis and grade 3 diastolic dysfunction.  Given reduced EF, caution with IV fluids.  Discontinued Cardura.  Started low-dose Coreg with hold parameters.  Continue losartan.  He is volume up on exam with IV Lasix restarted at home dose along with potassium supplementation.  Daily BMET.  Monitor I/os, daily standing weights.  Given his reduced EF, further ischemic work-up recommended as outlined below.  Elevated high-sensitivity troponin without chest pain Nonobstructive coronary artery disease --No chest pain.  Reports chronic/unchanged dyspnea.  Previous 2012 catheterization with nonobstructive CAD and elevated LVEDP.  2018 stress test with no significant ischemia and ruled a low risk scan.  MPI was unable to exclude a small region of old infarct versus attenuation artifact.  High-sensitivity troponin peaked at 321 and downtrending.  Considered elevated high-sensitivity troponin due to supply demand ischemia; however, given his reduced EF, cannot  rule out  coronary insufficiency and further ischemic work-up recommended.  Initial plan was for catheterization; however, after reviewing risks and benefits of both invasive and noninvasive work-up  patient preference is for stress testing over that of catheterization.  Will plan for stress test tomorrow 11/01/2020 with further recommendations as indicated following MPI tomorrow.  We will check lipid panel/LFTs.  He was started on IV heparin as above, which we will continue.  Continue ASA, Coreg, and Lipitor. Addendum: As patient received a VQ scan today, stress testing not recommended until Thursday 10/20.  We will plan to discuss this with the patient tomorrow, as he may wish to instead proceed with cardiac catheterization.  N.p.o. after midnight.  Elevated D-dimer, history of pulmonary embolism --Previous history of pulmonary embolism.  Elevated D-dimer at presentation.  Subsequent VQ scan with low suspicion for pulmonary embolism.  No evidence of right heart strain on echo.   Hypokalemia --Likely in the setting of discontinuation of his potassium supplement.  Presented to the emergency department with significant hypokalemia, improved following repletion today.  Check magnesium with goal 2.0.  Maintain electrolytes at goal.  Essential hypertension --Continue current medications.   For questions or updates, please contact [place] Please consult [email or web address] for contact info under    Signed, Arvil Chaco, PA-C  10/31/2020 1:32 PM

## 2020-10-31 NOTE — Progress Notes (Signed)
PT Cancellation Note  Patient Details Name: Donald [name] MRN: [id number] DOB: 08-18-1945   Cancelled Treatment:    Reason Eval/Treat Not Completed: Medical issues which prohibited therapy (Consult received and chart reviewed.  Noted with pending VQ scan to rule out PE; will hold PT eval until test complete, results received and patient cleared for activity.  will continue to follow and initiate as appropriate.)  Margretta Zamorano H. Owens Shark, PT, DPT, NCS 10/31/20, 10:35 AM [phone]

## 2020-10-31 NOTE — ED Notes (Signed)
Pt assisted to restroom.  

## 2020-10-31 NOTE — ED Notes (Signed)
Pt provided diet ginger aile and pain meds for ruptured disc in back as requested

## 2020-10-31 NOTE — H&P (View-Only) (Signed)
Cardiology Consultation:   Patient ID: Donald [name] MRN: [id number]; DOB: [birth year]  Admit date: 10/30/2020 Date of Consult: 10/31/2020  PCP:  Ria Bush, [facility]  Cardiologist:  Ida Rogue, MD  Advanced Practice Provider:  No care team member to display Electrophysiologist:  None [id number]    Patient Profile:   Donald [name] is a 75 y.o. male with a hx of HFpEF (EF 60%-65%, 2012), nonobstructive coronary artery disease (cath 2012 with no critical high grade obstructive dz, elevated LVEDP), poorly controlled and insulin dependent diabetes, hyperlipidemia, PAD, 2009 DVT and extensive bilateral pulmonary embolism, CKD 3, OSA on CPAP, asthmatic bronchitis, morbid obesity, gout, GERD, anemia, history of hematuria, s/p parathyroidectomy (right sided, 2018), hypercalcemia, chronic back pain s/p surgeries, and who is being seen today for the evaluation of syncope at the request of Dr. Kurtis Bushman.  History of Present Illness:   Donald [name] is a 75 yo male with complex PMH as above including nonobstructive CAD, poorly controlled and insulin dependent DM2, HTN, OSA on CPAP, history of DVT/bilateral PE, and HFpEF.  In September 2009, he had a left sided DVT and extensive bilateral PE that was felt to be idipathic. He remained on coumadin until October 2010 with follow-up CTA negative for PE but that showed coronary artery calcification and cardiomegaly. He had a subsequent 2012 echocardiogram that showed EF 60-70%, mild LVH, mild LAE/RAE, and was without wall motion abnormalities. After an abnormal EKG in 2018, he had a stress test that was without significant ischemia.    He was last seen in office 10/08/2018 after presenting earlier that month for preoperative evaluation before a urology procedure and found to be volume overloaded with diuresis increased for 3 days.  Echo was obtained and showed EF 60 to 65%.  With diuresis, he reported improvement in  lower extremity edema and wt decreased 4 pounds.  Labs showed improvement in renal function.  At follow-up, he still noted chronic dyspnea.  It was noted that he was not checking his BP at home as recommended and was eating out most meals during the week.  Amlodipine was increased to 5 mg daily.  Leading up to his most recent admission, he denies any increase from his baseline chronic dyspnea.  He reports ongoing lower extremity edema but not increased from baseline.  He is unable to lay flat, though he states this is due to back pain.  He has chronic weakness due to deconditioning and notes a recent episode where he slipped off the bed due to  "slippery shorts." No chest pain or tachypalpitations.  He reported dizziness only when standing too quickly and resolving shortly thereafter. He was only occasionally monitoring his BP, usually with SBP 140-160s. He continued to eat out regularly at [employer].  No signs or symptoms consistent with bleeding. He notes CPAP compliance. He reports medication compliance but that he recently saw his nephrologist with recommendation that he stop his potassium supplement.  Today he is seen at [facility] after he presented 10/17 following a syncopal episode that occurred earlier that day while walking from the house to his car to go to physical therapy and at approximately 11:30 AM.  He denies any prodromal symptoms before this event. No chest pain, shortness of breath, racing heart rate, or presyncope.  He had breakfast earlier that day with sausage and grits.  He drank a soda but did not have any water. Prior to the episode, he remembers calling his wife.  He then went through the back door of his house to get to his car.  The next thing he remembers is waking up on the concrete.  He denies ever experiencing something like this before in the past.  He does not know if he hit his head.  He checked his watch, and is able to say that he was unconscious for 30 minutes.  He denies any chest  pain, racing heart rate, or grogginess after his syncopal episode.  He did report weakness and difficulty getting back on his feet, but he reports this is a chronic finding.  He was able to get back on his feet with both the help of his wife and son.  EMS was then called and informed him that his glucose was low and in the 60s.  He was brought to [facility] ED. In the ED, BP 158/132, HR 70 bpm.  Labs showed potassium 2.9, labs were also notable for sodium 141, creatinine 1.42, BUN 23, hemoglobin 15.4, hematocrit 45.6, D-dimer 0.96, TSH 2.448, free T4 1.06. Respiratory panel negative. EKG showed new atrial fibrillation.  UA showed Hgb.  High-sensitivity troponin 132  321  288.  Head CT / MRI without acute changes. VQ scan with low probability for PE. Echo with EF 45-50%.    Past Medical History:  Diagnosis Date   (HFpEF) heart failure with preserved ejection fraction ([place])    a. 09/2018 Echo: EF 60-65%. PASP 62mmHg. Mild-mod LAE. Mild MR/TR.   ANEMIA-IRON DEFICIENCY 07/26/2006   Anxiety    ASTHMA 07/26/2006   Asthma    Back pain, chronic    Bell's palsy    CKD (chronic kidney disease), stage III ([place])    COLONIC POLYPS, HX OF 07/26/2006   COVID-19 virus infection 01/2020   DEPRESSION 03/14/2009   DIABETES MELLITUS, TYPE II 07/26/2006   [place] DISEASE, LUMBAR 10/05/2007   DVT 12/03/2007   DYSLIPIDEMIA 04/13/2009   Dyspnea    when gets up and walks around and back is hurting really bad -only Shortness of breath  then   GERD 07/26/2006   Gout    Heart murmur    History of kidney stones    HYPERTENSION 07/26/2006   INSOMNIA 08/21/2007   Neuropathy    Nonobstructive CAD (coronary artery disease)    a. 2012 Cath: no high grade stenosis; b. 2018 MV: No ischemia. Attenuation artifact.    OBSTRUCTIVE SLEEP APNEA 12/03/2007   Use C-PAP   PERIPHERAL NEUROPATHY 07/26/2006   Pernicious anemia 11/20/2006   PULMONARY EMBOLISM 10/05/2007    Past Surgical History:  Procedure Laterality Date   [place], 06/14/2008   Dr. Trenton Gammon at [facility] [phone])   [place] Right 10/2017   CATARACT EXTRACTION W/PHACO Left 05/30/2014   Procedure: CATARACT EXTRACTION PHACO AND INTRAOCULAR LENS PLACEMENT ([place]);  Surgeon: Estill Cotta, MD;  Location: [facility] ORS;  Service: Ophthalmology;  Laterality: Left;  [name] 01:20 AP% 23.7 CDE 31.86   COLONOSCOPY  10/2002   HP, SSA, TA, rpt 3 yrs ([place])   COLONOSCOPY  01/2005   diverticulosis rpt 5 yrs ([place])    IR NEPHROSTOMY PLACEMENT LEFT  10/19/2018   IR URETERAL STENT LEFT NEW ACCESS W/O SEP NEPHROSTOMY CATH  10/19/2018   LITHOTRIPSY     X 2   NEPHROLITHOTOMY Left 10/19/2018   Procedure: NEPHROLITHOTOMY PERCUTANEOUS;  Surgeon: Franchot Gallo, MD;  Location: [facility] ORS;  Service: Urology;  Laterality:  Left;  3 HRS   PARATHYROIDECTOMY Right 04/18/2016   PARATHYROIDECTOMY for cyst [name], MD)   SHOULDER ARTHROSCOPY W/ ROTATOR CUFF REPAIR Right    TONSILLECTOMY       Home Medications:  Prior to Admission medications   Medication Sig Start Date End Date Taking? Authorizing Provider  allopurinol (ZYLOPRIM) 300 MG tablet Take 1 tablet (300 mg total) by mouth daily. 06/21/20  Yes Ria Bush, MD  Cholecalciferol (VITAMIN D3) 25 MCG (1000 UT) CAPS Take 1 capsule (1,000 Units total) by mouth daily. 03/17/20  Yes Ria Bush, MD  cyanocobalamin (,VITAMIN B-12,) 1000 MCG/ML injection Inject 1 mL (1,000 mcg total) into the muscle every 30 (thirty) days. 03/17/20  Yes Ria Bush, MD  doxazosin (CARDURA) 1 MG tablet Take 1 tablet (1 mg total) by mouth 2 (two) times daily. 04/21/20  Yes Gollan, Kathlene November, MD  furosemide (LASIX) 80 MG tablet TAKE ONE TABLET (80 MG) BY MOUTH EVERY DAY take an extra lasix for swelling 1-2 a week 04/21/20  Yes Gollan, Kathlene November, MD  insulin regular (NOVOLIN R RELION) 100 units/mL injection 3 times a day (just before each meal), 210-150-290 units, and syringes  4/day. 11/10/18  Yes Renato Shin, MD  albuterol (VENTOLIN HFA) 108 (90 Base) MCG/ACT inhaler Inhale 2 puffs into the lungs every 6 (six) hours as needed for wheezing or shortness of breath. Patient not taking: No sig reported 02/04/20   Ria Bush, MD  ALPRAZolam Duanne Moron) 1 MG tablet TAKE 1 TABLET BY MOUTH AT BEDTIME IF NEEDED FOR SLEEP 10/05/20   Ria Bush, MD  aspirin EC 81 MG tablet Take 1 tablet (81 mg total) by mouth daily. 02/17/19   Ria Bush, MD  colchicine (COLCRYS) 0.6 MG tablet Take 1 tablet (0.6 mg total) by mouth daily as needed (gout flare). On first day of gout flare may take 2 tablets at once. 05/04/20   Ria Bush, MD  cyclobenzaprine (FLEXERIL) 10 MG tablet Take 10 mg by mouth 3 (three) times daily as needed for muscle spasms. 10/13/20   [provider]  fluticasone (FLONASE) 50 MCG/ACT nasal spray Place 2 sprays into both nostrils daily. 06/18/19   Elby Beck, FNP  gabapentin (NEURONTIN) 600 MG tablet TAKE TWO TABLETS AT BEDTIME 09/19/20   Ria Bush, MD  glucose blood (GE100 BLOOD GLUCOSE TEST) test strip 1 each by Other route 4 (four) times daily. E11.9 07/23/19   Renato Shin, MD  HYDROcodone-acetaminophen [facility]) 10-325 MG tablet Take 1 tablet by mouth every 6 (six) hours as needed for moderate pain ((typically once to twice daily if needed)).  12/19/17   Ria Bush, MD  insulin NPH Human (HUMULIN N) 100 UNIT/ML injection Inject 2.7 mLs (270 Units total) into the skin at bedtime. 07/10/20   Renato Shin, MD  losartan (COZAAR) 25 MG tablet Take 1 tablet (25 mg total) by mouth at bedtime. 04/21/20   Minna Merritts, MD  lovastatin (MEVACOR) 40 MG tablet Take 2 tablets (80 mg total) by mouth at bedtime. 04/21/20   Minna Merritts, MD  Naphazoline-Pheniramine (OPCON-A OP) Place 1 drop into both eyes 3 (three) times daily as needed (itchy eyes.).     [provider]  ondansetron (ZOFRAN) 4 MG tablet Take 1 tablet (4 mg total)  by mouth every 4 (four) hours as needed for nausea. Patient not taking: Reported on 07/31/2020 08/10/18   Lesleigh Noe, MD  Sennosides-Docusate Sodium (STOOL SOFTENER & LAXATIVE PO) Take 2 tablets by mouth at  bedtime.    [provider]  Syringe/Needle, Disp, (SYRINGE 3CC/22GX1-1/2") 22G X 1-1/2" 3 ML MISC Use to administer monthly b12 shots 03/18/20   Ria Bush, MD    Inpatient Medications: Scheduled Meds:  allopurinol  300 mg Oral Daily   aspirin EC  81 mg Oral Daily   atorvastatin  80 mg Oral Daily   doxazosin  1 mg Oral BID   gabapentin  1,200 mg Oral QHS   heparin  5,000 Units Subcutaneous Q8H   insulin aspart  0-15 Units Subcutaneous TID WC   losartan  25 mg Oral QHS   sodium chloride flush  3 mL Intravenous Q12H   sodium chloride flush  3 mL Intravenous Q12H   Continuous Infusions:  sodium chloride     PRN Meds: sodium chloride, acetaminophen **OR** acetaminophen, colchicine, hydrALAZINE, HYDROcodone-acetaminophen, Naphazoline-Pheniramine, sodium chloride flush  Allergies:    Allergies  Allergen Reactions   Codeine Other (See Comments)    REACTION: chest pain   Pioglitazone Other (See Comments)    REACTION to Actos: swelling in ankles    Social History:   Social History   Socioeconomic History   Marital status: Married    Spouse name: Not on file   Number of children: Not on file   Years of education: Not on file   Highest education level: Not on file  Occupational History   Occupation: Disabled    Employer: [employer]  Tobacco Use   Smoking status: Never   Smokeless tobacco: Never  Vaping Use   Vaping Use: Never used  Substance and Sexual Activity   Alcohol use: No   Drug use: No   Sexual activity: Not on file  Other Topics Concern   Not on file  Social History Narrative   Married   Children   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   Activity is severely limited by medical problems   Diet is "good".   Never a smoker    Alcohol: none         Social Determinants of Radio broadcast assistant Strain: Medium Risk   Difficulty of Paying Living Expenses: Somewhat hard  Food Insecurity: Not on file  Transportation Needs: Not on file  Physical Activity: Not on file  Stress: Not on file  Social Connections: Not on file  Intimate Partner Violence: Not on file    Family History:    Family History  Problem Relation Age of Onset   Cancer Mother        Breast Cancer   Cancer Sister        Breast Cancer   Diabetes Father    Heart disease Father    Diabetes Paternal Grandfather      ROS:  Please see the history of present illness.  Review of Systems  Respiratory:  Negative for hemoptysis and shortness of breath.        Chronic DOE  Cardiovascular:  Positive for leg swelling. Negative for chest pain, palpitations, orthopnea and PND.  Gastrointestinal:  Negative for blood in stool and melena.  Genitourinary:  Negative for hematuria.  Musculoskeletal:  Positive for back pain, falls and neck pain.  Neurological:  Positive for dizziness, loss of consciousness and weakness. Negative for focal weakness.       Chronic weakness, chronic dizziness when standing  All other systems reviewed and are negative.  All other ROS reviewed and negative.     Physical Exam/Data:   Vitals:   10/31/20 1000 10/31/20 1200  10/31/20 1230 10/31/20 1300  BP: (!) 171/62 (!) 124/49  (!) 163/55  Pulse: (!) 57 60  68  Resp: (!) 22  20 19   Temp:      TempSrc:      SpO2: 95% 92% 93% 96%  Weight:      Height:       No intake or output data in the 24 hours ending 10/31/20 1332 Last 3 Weights 10/30/2020 07/10/2020 06/21/2020  Weight (lbs) 265 lb 10.5 oz 265 lb 9.6 oz 267 lb 4 oz  Weight (kg) 120.5 kg 120.475 kg 121.224 kg     Body mass index is 45.6 kg/m.  General: Obese male, NAD HEENT: normal Neck: JVP difficult to assess due to body habitus Vascular: No carotid bruits; radial pulses 2+ bilaterally Cardiac:  normal  S1, S2; RRR; 2/6 systolic murmur Lungs: Bibasilar crackles, worse on the left  Abd: Obese, nontender, no hepatomegaly  Ext: Moderate bilateral lower extremity edema Musculoskeletal:  No deformities, BUE and BLE strength normal and equal Skin: warm and dry  Neuro:  CNs 2-12 intact, no focal abnormalities noted Psych:  Normal affect   EKG:  The EKG was personally reviewed and demonstrates:  Afib (new), 76bpm,  Telemetry:  Telemetry was personally reviewed and demonstrates: NSR, PACs, PVCs  Relevant CV Studies: Echo 10/31/20  1. Left ventricular ejection fraction, by estimation, is 45 to 50%. The  left ventricle has mildly decreased function. The left ventricle  demonstrates global hypokinesis. There is moderate left ventricular  hypertrophy. Left ventricular diastolic  parameters are consistent with Grade III diastolic dysfunction  (restrictive). Elevated left atrial pressure.   2. Right ventricular systolic function is normal. The right ventricular  size is normal.   3. Left atrial size was mildly dilated.   4. The mitral valve is degenerative. Mild mitral valve regurgitation. No  evidence of mitral stenosis.   5. The aortic valve has an indeterminant number of cusps. There is mild  calcification of the aortic valve. There is moderate thickening of the  aortic valve. Aortic valve regurgitation is trivial. Mild to moderate  aortic valve sclerosis/calcification is  present, without any evidence of aortic stenosis.   MPI 02/2016 Pharmacological myocardial perfusion imaging study with no significant  ischemia There is a small region of mild fixed perfusion defect in the inferoapical/apical region. Unable to exclude small region of old infarct vs attenuation artifact. Nonattenuation corrected images with large region of fixed inferior wall perfusion defect consistent with diaphragmatic attenuation artifact. Small region of possible hypokinesis/wall motion abnormality in the basal  inferoseptal wall, EF estimated at 40% No EKG changes concerning for ischemia at peak stress or in recovery. Resting EKG with T wave abn in V5 and V6 Low risk scan    Laboratory Data:  High Sensitivity Troponin:   Recent Labs  Lab 10/30/20 1413 10/31/20 0934 10/31/20 1058  TROPONINIHS 132* 321* 288*     Chemistry Recent Labs  Lab 10/30/20 1413  NA 141  K 2.9*  CL 98  CO2 29  GLUCOSE 88  BUN 23  CREATININE 1.42*  CALCIUM 9.3  GFRNONAA 53*  ANIONGAP 14    No results for input(s): PROT, ALBUMIN, AST, ALT, ALKPHOS, BILITOT in the last 168 hours. Hematology Recent Labs  Lab 10/30/20 1413  WBC 9.8  RBC 5.16  HGB 15.4  HCT 45.6  MCV 88.4  MCH 29.8  MCHC 33.8  RDW 14.4  PLT 192   BNPNo results for input(s): BNP, PROBNP in the  last 168 hours.  DDimer  Recent Labs  Lab 10/30/20 1413  DDIMER 0.96*     Radiology/Studies:  DG Chest 1 View  Result Date: 10/31/2020 CLINICAL DATA:  History of asthma, heart failure presents after fall EXAM: CHEST  1 VIEW COMPARISON:  Chest radiograph 08/12/2018 FINDINGS: The cardiomediastinal silhouette is normal. There is no focal consolidation or pulmonary edema. There is no pleural effusion or pneumothorax. There is no acute osseous abnormality. IMPRESSION: No radiographic evidence of acute cardiopulmonary process. Electronically Signed   By: Valetta Mole M.D.   On: 10/31/2020 11:10   CT HEAD WO CONTRAST (5MM)  Result Date: 10/30/2020 CLINICAL DATA:  Head trauma, minor (Age >= 65y).  Unwitnessed fall. EXAM: CT HEAD WITHOUT CONTRAST TECHNIQUE: Contiguous axial images were obtained from the base of the skull through the vertex without intravenous contrast. COMPARISON:  None. FINDINGS: Brain: No acute intracranial abnormality. Specifically, no hemorrhage, hydrocephalus, mass lesion, acute infarction, or significant intracranial injury. Vascular: No hyperdense vessel or unexpected calcification. Skull: No acute calvarial abnormality.  Sinuses/Orbits: No acute findings Other: None IMPRESSION: No acute intracranial abnormality. Electronically Signed   By: Rolm Baptise M.D.   On: 10/30/2020 17:56   MR BRAIN WO CONTRAST  Result Date: 10/30/2020 CLINICAL DATA:  Syncopal episode. EXAM: MRI HEAD WITHOUT CONTRAST TECHNIQUE: Multiplanar, multiecho pulse sequences of the brain and surrounding structures were obtained without intravenous contrast. COMPARISON:  Head CT earlier same day. FINDINGS: Brain: Diffusion imaging does not show any acute or subacute infarction. No focal abnormality affects the brainstem or cerebellum. Cerebral hemispheres show mild age related volume loss with minimal small vessel change of the white matter, less than often seen at this age. No cortical or large vessel territory infarction. No mass lesion, hemorrhage, hydrocephalus or extra-axial collection. Vascular: Major vessels at the base of the brain show flow. Skull and upper cervical spine: Negative Sinuses/Orbits: Paranasal sinuses are clear. Orbits are negative. Previous lens implant on the left. Other: Left mastoid effusion. IMPRESSION: No acute intracranial finding. Mild age related volume loss and minimal small vessel change of the hemispheric white matter. Left mastoid effusion. Electronically Signed   By: Nelson Chimes M.D.   On: 10/30/2020 20:39   NM Pulmonary Perfusion  Result Date: 10/31/2020 CLINICAL DATA:  PE suspected EXAM: NUCLEAR MEDICINE PERFUSION LUNG SCAN TECHNIQUE: Perfusion images were obtained in multiple projections after intravenous injection of radiopharmaceutical. Ventilation scans intentionally deferred if perfusion scan and chest x-ray adequate for interpretation during COVID 19 epidemic. RADIOPHARMACEUTICALS:  4.44 mCi Tc-50m MAA IV COMPARISON:  Same-day chest radiographs FINDINGS: Normal, homogeneous perfusion of the lungs. No suspicious filling defects. IMPRESSION: Very low probability examination for pulmonary embolism by modified  perfusion only PIOPED criteria (PE absent). Electronically Signed   By: Delanna Ahmadi M.D.   On: 10/31/2020 11:03   US Carotid Bilateral  Result Date: 10/31/2020 CLINICAL DATA:  Syncope and collapse EXAM: BILATERAL CAROTID DUPLEX ULTRASOUND TECHNIQUE: Pearline Cables scale imaging, color Doppler and duplex ultrasound were performed of bilateral carotid and vertebral arteries in the neck. COMPARISON:  None. FINDINGS: Criteria: Quantification of carotid stenosis is based on velocity parameters that correlate the residual internal carotid diameter with NASCET-based stenosis levels, using the diameter of the distal internal carotid lumen as the denominator for stenosis measurement. The following velocity measurements were obtained: RIGHT ICA: 68 cm/sec CCA: 85 cm/sec SYSTOLIC ICA/CCA RATIO:  0.9 ECA: 170 cm/sec LEFT ICA: 75 cm/sec CCA: 82 cm/sec SYSTOLIC ICA/CCA RATIO:  1.1 ECA: 136 cm/sec RIGHT CAROTID  ARTERY: Antegrade RIGHT VERTEBRAL ARTERY:  Antegrade LEFT CAROTID ARTERY:  Antegrade LEFT VERTEBRAL ARTERY:  Antegrade Upper extremity blood pressures: Not provided Mild plaque in the distal common carotid arteries bilaterally, as well as at the bifurcation. IMPRESSION: Velocities consistent with less than 50% stenosis in the bilateral carotid arteries. Electronically Signed   By: Merilyn Baba M.D.   On: 10/31/2020 01:49   ECHOCARDIOGRAM COMPLETE  Result Date: 10/31/2020    ECHOCARDIOGRAM REPORT   Patient Name:   JADORE VEALS Date of Exam: 10/31/2020 Medical Rec #:  [id number]         Height:       64.0 in Accession #:    [id number]        Weight:       265.7 lb Date of Birth:  09-30-1945        BSA:          2.207 m Patient Age:    77 years          BP:           143/54 mmHg Patient Gender: M                 HR:           56 bpm. Exam Location:  [facility] Procedure: 2D Echo, Color Doppler, Cardiac Doppler and Intracardiac            Opacification Agent Indications:     R55 Syncope  History:         Patient has prior  history of Echocardiogram examinations, most                  recent 10/01/2018. HFrEF; Risk Factors:Hypertension, Diabetes                  and Sleep Apnea.  Sonographer:     Charmayne Sheer Referring Phys:  [id number] Gretta Cool PATEL Diagnosing Phys: Nelva Bush MD  Sonographer Comments: Suboptimal apical window and suboptimal subcostal window. IMPRESSIONS  1. Left ventricular ejection fraction, by estimation, is 45 to 50%. The left ventricle has mildly decreased function. The left ventricle demonstrates global hypokinesis. There is moderate left ventricular hypertrophy. Left ventricular diastolic parameters are consistent with Grade III diastolic dysfunction (restrictive). Elevated left atrial pressure.  2. Right ventricular systolic function is normal. The right ventricular size is normal.  3. Left atrial size was mildly dilated.  4. The mitral valve is degenerative. Mild mitral valve regurgitation. No evidence of mitral stenosis.  5. The aortic valve has an indeterminant number of cusps. There is mild calcification of the aortic valve. There is moderate thickening of the aortic valve. Aortic valve regurgitation is trivial. Mild to moderate aortic valve sclerosis/calcification is present, without any evidence of aortic stenosis. FINDINGS  Left Ventricle: Left ventricular ejection fraction, by estimation, is 45 to 50%. The left ventricle has mildly decreased function. The left ventricle demonstrates global hypokinesis. Definity contrast agent was given IV to delineate the left ventricular  endocardial borders. The left ventricular internal cavity size was normal in size. There is moderate left ventricular hypertrophy. Left ventricular diastolic parameters are consistent with Grade III diastolic dysfunction (restrictive). Elevated left atrial pressure. Right Ventricle: The right ventricular size is normal. No increase in right ventricular wall thickness. Right ventricular systolic function is normal. Left Atrium: Left  atrial size was mildly dilated. Right Atrium: Right atrial size was not well visualized. Pericardium: The pericardium was not well visualized. Mitral Valve: The mitral  valve is degenerative in appearance. There is mild thickening of the mitral valve leaflet(s). There is mild calcification of the mitral valve leaflet(s). Mild mitral valve regurgitation. No evidence of mitral valve stenosis. MV peak gradient, 4.8 mmHg. The mean mitral valve gradient is 1.0 mmHg. Tricuspid Valve: The tricuspid valve is not well visualized. Tricuspid valve regurgitation is not demonstrated. Aortic Valve: The aortic valve has an indeterminant number of cusps. There is mild calcification of the aortic valve. There is moderate thickening of the aortic valve. There is mild aortic valve annular calcification. Aortic valve regurgitation is trivial. Mild to moderate aortic valve sclerosis/calcification is present, without any evidence of aortic stenosis. Aortic valve mean gradient measures 7.0 mmHg. Aortic valve peak gradient measures 11.7 mmHg. Aortic valve area, by VTI measures 2.78 cm. Pulmonic Valve: The pulmonic valve was not well visualized. Pulmonic valve regurgitation is not visualized. No evidence of pulmonic stenosis. Aorta: The aortic root is normal in size and structure. Pulmonary Artery: The pulmonary artery is of normal size. IAS/Shunts: The interatrial septum was not well visualized.  LEFT VENTRICLE PLAX 2D LVIDd:         5.10 cm      Diastology LVIDs:         3.93 cm      LV e' medial:   6.31 cm/s LV PW:         1.53 cm      LV E/e' medial: 17.0 LV IVS:        1.31 cm LVOT diam:     2.20 cm LV SV:         108 LV SV Index:   49 LVOT Area:     3.80 cm  LV Volumes (MOD) LV vol d, MOD A2C: 158.0 ml LV vol d, MOD A4C: 187.0 ml LV vol s, MOD A2C: 94.2 ml LV vol s, MOD A4C: 129.0 ml LV SV MOD A2C:     63.8 ml LV SV MOD A4C:     187.0 ml LV SV MOD BP:      62.0 ml LEFT ATRIUM             Index LA diam:        4.10 cm 1.86 cm/m LA  Vol (A2C):   67.8 ml 30.72 ml/m LA Vol (A4C):   67.2 ml 30.45 ml/m LA Biplane Vol: 70.9 ml 32.12 ml/m  AORTIC VALVE                     PULMONIC VALVE AV Area (Vmax):    2.87 cm      PV Vmax:       1.00 m/s AV Area (Vmean):   2.67 cm      PV Vmean:      68.300 cm/s AV Area (VTI):     2.78 cm      PV VTI:        0.203 m AV Vmax:           171.00 cm/s   PV Peak grad:  4.0 mmHg AV Vmean:          126.000 cm/s  PV Mean grad:  2.0 mmHg AV VTI:            0.388 m AV Peak Grad:      11.7 mmHg AV Mean Grad:      7.0 mmHg LVOT Vmax:         129.00 cm/s LVOT Vmean:  88.600 cm/s LVOT VTI:          0.284 m LVOT/AV VTI ratio: 0.73  AORTA Ao Root diam: 3.30 cm MITRAL VALVE MV Area (PHT): 3.77 cm     SHUNTS MV Area VTI:   3.77 cm     Systemic VTI:  0.28 m MV Peak grad:  4.8 mmHg     Systemic Diam: 2.20 cm MV Mean grad:  1.0 mmHg MV Vmax:       1.09 m/s MV Vmean:      54.1 cm/s MV Decel Time: 201 msec MV E velocity: 107.00 cm/s MV A velocity: 39.80 cm/s MV E/A ratio:  2.69 Harrell Gave End MD Electronically signed by Nelva Bush MD Signature Date/Time: 10/31/2020/12:38:25 PM    Final      Assessment and Plan:   Syncopal episode  -- Reports a syncopal episode 10/17 on his way to physical therapy and with LOC for 30 minutes.  At presentation to the ED, he was hypokalemic. Pt reports stopping his potassium supplement but continuing on Lasix 80 mg daily. EKG at presentation showed new atrial fibrillation.  S/p K+ repletion, he is back in NSR.  Echo EF 45 to 50% and recommendations as below.   VQ scan performed for syncope with h/o PE in the past and with low suspicion for PE.  No evidence of right heart strain on echo. Bilateral carotids with less than 50% stenosis.  Orthostatics ordered, pending.  He does not remember if he hit his head when he lost consciousness with CT head/MRI brain without acute changes.    Recent syncopal episode after stopping his potassium supplementation with EKG showing Afib on  presentation and newly reduced EF as well as elevated Tn.  K repleted. Further workup of reduced EF and elevated HS Tn as below. Continue to monitor on telemetry. Maintain electrolytes at goal.  New onset atrial fibrillation  --Currently in sinus rhythm with repeat EKG pending.  Presented in atrial fibrillation of unknown chronicity.  Suspect atrial fibrillation likely triggered by hypokalemia.  Recommend daily BMET.  Will check TSH. He does have a history of sleep apnea with ongoing CPAP use encouraged.  Started low-dose Coreg with hold parameters.  Started IV heparin with daily CBC ordered.  Initial H&H stable.  Given his history of hematuria and recent UA, recommend monitoring blood counts closely on IV heparin.  CHA2DS2VASc score of at least 5 (CHF, HTN, DM2, agex1, vascular) 1 to recommend transitioning to NOAC before discharge.   New systolic heart failure with known diastolic heart failure -- Reports chronic dyspnea.  Echo EF 45 to 50% with LV global hypokinesis and grade 3 diastolic dysfunction.  Given reduced EF, caution with IV fluids.  Discontinued Cardura.  Started low-dose Coreg with hold parameters.  Continue losartan.  He is volume up on exam with IV Lasix restarted at home dose along with potassium supplementation.  Daily BMET.  Monitor I/os, daily standing weights.  Given his reduced EF, further ischemic work-up recommended as outlined below.  Elevated high-sensitivity troponin without chest pain Nonobstructive coronary artery disease --No chest pain.  Reports chronic/unchanged dyspnea.  Previous 2012 catheterization with nonobstructive CAD and elevated LVEDP.  2018 stress test with no significant ischemia and ruled a low risk scan.  MPI was unable to exclude a small region of old infarct versus attenuation artifact.  High-sensitivity troponin peaked at 321 and downtrending.  Considered elevated high-sensitivity troponin due to supply demand ischemia; however, given his reduced EF, cannot  rule out  coronary insufficiency and further ischemic work-up recommended.  Initial plan was for catheterization; however, after reviewing risks and benefits of both invasive and noninvasive work-up  patient preference is for stress testing over that of catheterization.  Will plan for stress test tomorrow 11/01/2020 with further recommendations as indicated following MPI tomorrow.  We will check lipid panel/LFTs.  He was started on IV heparin as above, which we will continue.  Continue ASA, Coreg, and Lipitor. Addendum: As patient received a VQ scan today, stress testing not recommended until Thursday 10/20.  We will plan to discuss this with the patient tomorrow, as he may wish to instead proceed with cardiac catheterization.  N.p.o. after midnight.  Elevated D-dimer, history of pulmonary embolism --Previous history of pulmonary embolism.  Elevated D-dimer at presentation.  Subsequent VQ scan with low suspicion for pulmonary embolism.  No evidence of right heart strain on echo.   Hypokalemia --Likely in the setting of discontinuation of his potassium supplement.  Presented to the emergency department with significant hypokalemia, improved following repletion today.  Check magnesium with goal 2.0.  Maintain electrolytes at goal.  Essential hypertension --Continue current medications.   For questions or updates, please contact [place] Please consult [email or web address] for contact info under    Signed, Arvil Chaco, PA-C  10/31/2020 1:32 PM

## 2020-10-31 NOTE — ED Notes (Signed)
Patient medicated for lower back pain.  Stretcher changed to hospital bed for patient comfort.  Chair provided beside bed for patient to sit on as need.  New sheets and blankets provided

## 2020-10-31 NOTE — Progress Notes (Signed)
PROGRESS NOTE    Donald [name]  ZOX:[id number] DOB: 1945-05-01 DOA: 10/30/2020 PCP: Ria Bush, MD    Brief Narrative:  Pt is a 75 year old with past medical history of asthma, CKD,CHF, Anxiety, DM II, DVT, Covid-19, Htn, Neuropathy, OSA. Cardiologist is Dr.Golan.Nephrologist  -[place], urologist [facility] Presents after having a syncopal episode.  Patient reported to me this a.m. he was going to his therapy session yesterday and was walking from the garage to his car door and next thing he knows he was on the floor.  He does not remember syncopized seeing, having any prodromal symptoms prior to his syncopal episodes.  Denies any chest pain or shortness of breath.  He thinks he was down for 30 minutes as he had checked his watch when he was woke up.   Consultants:    Procedures:   Antimicrobials:      Subjective: Denies cp, sob, palpitations  Objective: Vitals:   10/31/20 0200 10/31/20 0300 10/31/20 0500 10/31/20 0600  BP: (!) 156/61 (!) 130/53 (!) 127/54 109/82  Pulse: 60 (!) 58 (!) 51 (!) 51  Resp: 18 18 18 18   Temp:      TempSrc:      SpO2: 96% 94% 95% 94%  Weight:      Height:       No intake or output data in the 24 hours ending 10/31/20 0856 Filed Weights   10/30/20 1631  Weight: 120.5 kg    Examination:  General exam: Appears calm and comfortable  Respiratory system: Clear to auscultation. Respiratory effort normal. Cardiovascular system: S1 & S2 heard, RRR. No gallop Gastrointestinal system: Abdomen is nondistended, soft and nontender. No organomegaly or masses felt. Normal bowel sounds heard. Central nervous system: Alert and oriented. Grossly intact Extremities: b/l edema Psychiatry: Judgement and insight appear normal. Mood & affect appropriate.     Data Reviewed: I have personally reviewed following labs and imaging studies  CBC: Recent Labs  Lab 10/30/20 1413  WBC 9.8  HGB 15.4  HCT 45.6  MCV 88.4  PLT 811    Basic Metabolic Panel: Recent Labs  Lab 10/30/20 1413  NA 141  K 2.9*  CL 98  CO2 29  GLUCOSE 88  BUN 23  CREATININE 1.42*  CALCIUM 9.3   GFR: Estimated Creatinine Clearance: 55.7 mL/min (A) (by C-G formula based on SCr of 1.42 mg/dL (H)). Liver Function Tests: No results for input(s): AST, ALT, ALKPHOS, BILITOT, PROT, ALBUMIN in the last 168 hours. No results for input(s): LIPASE, AMYLASE in the last 168 hours. No results for input(s): AMMONIA in the last 168 hours. Coagulation Profile: No results for input(s): INR, PROTIME in the last 168 hours. Cardiac Enzymes: No results for input(s): CKTOTAL, CKMB, CKMBINDEX, TROPONINI in the last 168 hours. BNP (last 3 results) No results for input(s): PROBNP in the last 8760 hours. HbA1C: No results for input(s): HGBA1C in the last 72 hours. CBG: Recent Labs  Lab 10/30/20 1457 10/30/20 2210  GLUCAP 117* 206*   Lipid Profile: No results for input(s): CHOL, HDL, LDLCALC, TRIG, CHOLHDL, LDLDIRECT in the last 72 hours. Thyroid Function Tests: Recent Labs    10/30/20 1413  TSH 2.448  FREET4 1.04   Anemia Panel: No results for input(s): VITAMINB12, FOLATE, FERRITIN, TIBC, IRON, RETICCTPCT in the last 72 hours. Sepsis Labs: No results for input(s): PROCALCITON, LATICACIDVEN in the last 168 hours.  Recent Results (from the past 240 hour(s))  Resp Panel by RT-PCR (Flu A&B, Covid) Nasopharyngeal Swab  Status: None   Collection Time: 10/30/20  5:04 PM   Specimen: Nasopharyngeal Swab; Nasopharyngeal(NP) swabs in vial transport medium  Result Value Ref Range Status   SARS Coronavirus 2 by RT PCR NEGATIVE NEGATIVE Final    Comment: (NOTE) SARS-CoV-2 target nucleic acids are NOT DETECTED.  The SARS-CoV-2 RNA is generally detectable in upper respiratory specimens during the acute phase of infection. The lowest concentration of SARS-CoV-2 viral copies this assay can detect is 138 copies/mL. A negative result does not preclude  SARS-Cov-2 infection and should not be used as the sole basis for treatment or other patient management decisions. A negative result may occur with  improper specimen collection/handling, submission of specimen other than nasopharyngeal swab, presence of viral mutation(s) within the areas targeted by this assay, and inadequate number of viral copies(<138 copies/mL). A negative result must be combined with clinical observations, patient history, and epidemiological information. The expected result is Negative.  Fact Sheet for Patients:  EntrepreneurPulse.com.au  Fact Sheet for Healthcare Providers:  IncredibleEmployment.be  This test is no t yet approved or cleared by the [place] FDA and  has been authorized for detection and/or diagnosis of SARS-CoV-2 by FDA under an Emergency Use Authorization (EUA). This EUA will remain  in effect (meaning this test can be used) for the duration of the COVID-19 declaration under Section 564(b)(1) of the Act, 21 U.S.C.section 360bbb-3(b)(1), unless the authorization is terminated  or revoked sooner.       Influenza A by PCR NEGATIVE NEGATIVE Final   Influenza B by PCR NEGATIVE NEGATIVE Final    Comment: (NOTE) The Xpert Xpress SARS-CoV-2/FLU/RSV plus assay is intended as an aid in the diagnosis of influenza from Nasopharyngeal swab specimens and should not be used as a sole basis for treatment. Nasal washings and aspirates are unacceptable for Xpert Xpress SARS-CoV-2/FLU/RSV testing.  Fact Sheet for Patients: EntrepreneurPulse.com.au  Fact Sheet for Healthcare Providers: IncredibleEmployment.be  This test is not yet approved or cleared by the [place] FDA and has been authorized for detection and/or diagnosis of SARS-CoV-2 by FDA under an Emergency Use Authorization (EUA). This EUA will remain in effect (meaning this test can be used) for the duration of  the COVID-19 declaration under Section 564(b)(1) of the Act, 21 U.S.C. section 360bbb-3(b)(1), unless the authorization is terminated or revoked.  Performed at [facility], [address] [place], [place] [address]          Radiology Studies: CT HEAD WO CONTRAST (5MM)  Result Date: 10/30/2020 CLINICAL DATA:  Head trauma, minor (Age >= 65y).  Unwitnessed fall. EXAM: CT HEAD WITHOUT CONTRAST TECHNIQUE: Contiguous axial images were obtained from the base of the skull through the vertex without intravenous contrast. COMPARISON:  None. FINDINGS: Brain: No acute intracranial abnormality. Specifically, no hemorrhage, hydrocephalus, mass lesion, acute infarction, or significant intracranial injury. Vascular: No hyperdense vessel or unexpected calcification. Skull: No acute calvarial abnormality. Sinuses/Orbits: No acute findings Other: None IMPRESSION: No acute intracranial abnormality. Electronically Signed   By: Rolm Baptise M.D.   On: 10/30/2020 17:56   MR BRAIN WO CONTRAST  Result Date: 10/30/2020 CLINICAL DATA:  Syncopal episode. EXAM: MRI HEAD WITHOUT CONTRAST TECHNIQUE: Multiplanar, multiecho pulse sequences of the brain and surrounding structures were obtained without intravenous contrast. COMPARISON:  Head CT earlier same day. FINDINGS: Brain: Diffusion imaging does not show any acute or subacute infarction. No focal abnormality affects the brainstem or cerebellum. Cerebral hemispheres show mild age related volume loss with minimal small vessel change  of the white matter, less than often seen at this age. No cortical or large vessel territory infarction. No mass lesion, hemorrhage, hydrocephalus or extra-axial collection. Vascular: Major vessels at the base of the brain show flow. Skull and upper cervical spine: Negative Sinuses/Orbits: Paranasal sinuses are clear. Orbits are negative. Previous lens implant on the left. Other: Left mastoid effusion. IMPRESSION: No acute  intracranial finding. Mild age related volume loss and minimal small vessel change of the hemispheric white matter. Left mastoid effusion. Electronically Signed   By: Nelson Chimes M.D.   On: 10/30/2020 20:39   US Carotid Bilateral  Result Date: 10/31/2020 CLINICAL DATA:  Syncope and collapse EXAM: BILATERAL CAROTID DUPLEX ULTRASOUND TECHNIQUE: Pearline Cables scale imaging, color Doppler and duplex ultrasound were performed of bilateral carotid and vertebral arteries in the neck. COMPARISON:  None. FINDINGS: Criteria: Quantification of carotid stenosis is based on velocity parameters that correlate the residual internal carotid diameter with NASCET-based stenosis levels, using the diameter of the distal internal carotid lumen as the denominator for stenosis measurement. The following velocity measurements were obtained: RIGHT ICA: 68 cm/sec CCA: 85 cm/sec SYSTOLIC ICA/CCA RATIO:  0.9 ECA: 170 cm/sec LEFT ICA: 75 cm/sec CCA: 82 cm/sec SYSTOLIC ICA/CCA RATIO:  1.1 ECA: 136 cm/sec RIGHT CAROTID ARTERY: Antegrade RIGHT VERTEBRAL ARTERY:  Antegrade LEFT CAROTID ARTERY:  Antegrade LEFT VERTEBRAL ARTERY:  Antegrade Upper extremity blood pressures: Not provided Mild plaque in the distal common carotid arteries bilaterally, as well as at the bifurcation. IMPRESSION: Velocities consistent with less than 50% stenosis in the bilateral carotid arteries. Electronically Signed   By: Merilyn Baba M.D.   On: 10/31/2020 01:49        Scheduled Meds:  allopurinol  300 mg Oral Daily   aspirin EC  81 mg Oral Daily   atorvastatin  80 mg Oral Daily   doxazosin  1 mg Oral BID   gabapentin  1,200 mg Oral QHS   heparin  5,000 Units Subcutaneous Q8H   insulin aspart  0-15 Units Subcutaneous TID WC   losartan  25 mg Oral QHS   sodium chloride flush  3 mL Intravenous Q12H   sodium chloride flush  3 mL Intravenous Q12H   Continuous Infusions:  sodium chloride      Assessment & Plan:   Principal Problem:   Syncope and  collapse Active Problems:   Iron deficiency anemia   Essential hypertension   History of pulmonary embolus (PE)   GERD   OSA (obstructive sleep apnea)   CAD (coronary artery disease)   CKD stage 3 due to type 2 diabetes mellitus ([place])   Type 2 diabetes mellitus with diabetic chronic kidney disease ([place])   Syncope  Differentials include CVA/dysrhythmia associated/TIA/orthostatic hypotension. Concerned for dysrhythmia Echo revealed EF of 45 to 50% with global hypokinesis.  Grade 3 diastolic dysfunction. Cardiology consulted Also check orthostatics Carotid ultrasound less than 50% stenosis bilaterally MRI with no acute intracranial findings.    Cardiomyopathy New diagnosis EF 45-50%  Cardiology consulted Ck  orthostatics, if negative resume home lasix   CKD stage IIIa Appears to be at baseline. Increase losartan to 25 mg twice daily BMP ordered and pending    Iron deficiency anemia:  Resolved   Hypertension: On admission blood pressure was elevated.   Increase losartan to 25 mg twice daily  MRI negative for acute stroke  Will decrease BP slowly   History of PE: In 2009 per patient.  Was on anticoagulation. No shortness of breath or tachycardia.  Will obtain venous ultrasound since has lower extremity edema Vq scan low probability       Obstructive sleep apnea:  Needs cpap , does not tolerate   CAD: Continue asa, statin         Diabetes mellitus type 2: Sliding scale insulin, A1c. Hold home regimen of insulin.     DVT prophylaxis: heparin Code Status:full Family Communication: none at bedside Disposition Plan:  Status is: Inpatient  Remains inpatient appropriate because:Inpatient level of care appropriate due to severity of illness   Dispo: The patient is from: Home              Anticipated d/c is to: Home              Patient currently is not medically stable to d/c.              Difficult to place patient No            LOS: 1  day   Time spent: 45 min with 50% on coc     Nolberto Hanlon, MD [facility] Pager [phone]  If 7PM-7AM, please contact night-coverage 10/31/2020, 8:56 AM

## 2020-10-31 NOTE — ED Notes (Signed)
Patient provided with light snack. Aware that he will be NPO after midnight

## 2020-10-31 NOTE — ED Notes (Signed)
Pt returns from [facility]

## 2020-10-31 NOTE — Progress Notes (Deleted)
NO SHOW

## 2020-11-01 ENCOUNTER — Other Ambulatory Visit: Payer: Medicare Other

## 2020-11-01 ENCOUNTER — Encounter
Admission: EM | Disposition: A | Discharge status: Home / Self Care | Payer: Self-pay | Source: Home / Self Care | Attending: Internal Medicine

## 2020-11-01 ENCOUNTER — Encounter: Payer: Self-pay | Admitting: Cardiovascular Disease

## 2020-11-01 ENCOUNTER — Other Ambulatory Visit: Payer: Self-pay | Admitting: Physician Assistant

## 2020-11-01 ENCOUNTER — Telehealth: Payer: Self-pay

## 2020-11-01 DIAGNOSIS — I214 Non-ST elevation (NSTEMI) myocardial infarction: Secondary | ICD-10-CM

## 2020-11-01 DIAGNOSIS — R55 Syncope and collapse: Secondary | ICD-10-CM

## 2020-11-01 DIAGNOSIS — I5043 Acute on chronic combined systolic (congestive) and diastolic (congestive) heart failure: Secondary | ICD-10-CM

## 2020-11-01 DIAGNOSIS — N1831 Chronic kidney disease, stage 3a: Secondary | ICD-10-CM | POA: Diagnosis not present

## 2020-11-01 DIAGNOSIS — I429 Cardiomyopathy, unspecified: Secondary | ICD-10-CM

## 2020-11-01 HISTORY — PX: LEFT HEART CATH AND CORONARY ANGIOGRAPHY: CATH118249

## 2020-11-01 LAB — CBC
HCT: 43.3 % (ref 39.0–52.0)
Hemoglobin: 13.6 g/dL (ref 13.0–17.0)
MCH: 28.2 pg (ref 26.0–34.0)
MCHC: 31.4 g/dL (ref 30.0–36.0)
MCV: 89.8 fL (ref 80.0–100.0)
Platelets: 177 10*3/uL (ref 150–400)
RBC: 4.82 MIL/uL (ref 4.22–5.81)
RDW: 14.2 % (ref 11.5–15.5)
WBC: 6.2 10*3/uL (ref 4.0–10.5)
nRBC: 0 % (ref 0.0–0.2)

## 2020-11-01 LAB — HEPARIN LEVEL (UNFRACTIONATED)
Heparin Unfractionated: 0.26 IU/mL — ABNORMAL LOW (ref 0.30–0.70)
Heparin Unfractionated: 0.31 IU/mL (ref 0.30–0.70)

## 2020-11-01 LAB — BASIC METABOLIC PANEL
Anion gap: 10 (ref 5–15)
BUN: 27 mg/dL — ABNORMAL HIGH (ref 8–23)
CO2: 30 mmol/L (ref 22–32)
Calcium: 9.2 mg/dL (ref 8.9–10.3)
Chloride: 98 mmol/L (ref 98–111)
Creatinine, Ser: 1.55 mg/dL — ABNORMAL HIGH (ref 0.61–1.24)
GFR, Estimated: 47 mL/min — ABNORMAL LOW (ref >60)
Glucose, Bld: 262 mg/dL — ABNORMAL HIGH (ref 70–99)
Potassium: 3.9 mmol/L (ref 3.5–5.1)
Sodium: 138 mmol/L (ref 135–145)

## 2020-11-01 LAB — CBG MONITORING, ED
Glucose-Capillary: 270 mg/dL — ABNORMAL HIGH (ref 70–99)
Glucose-Capillary: 231 mg/dL — ABNORMAL HIGH (ref 70–99)

## 2020-11-01 LAB — GLUCOSE, CAPILLARY
Glucose-Capillary: 197 mg/dL — ABNORMAL HIGH (ref 70–99)
Glucose-Capillary: 268 mg/dL — ABNORMAL HIGH (ref 70–99)

## 2020-11-01 LAB — HEMOGLOBIN A1C
Hgb A1c MFr Bld: 7 % — ABNORMAL HIGH (ref 4.8–5.6)
Mean Plasma Glucose: 154 mg/dL

## 2020-11-01 SURGERY — LEFT HEART CATH AND CORONARY ANGIOGRAPHY
Anesthesia: Moderate Sedation

## 2020-11-01 MED ORDER — SODIUM CHLORIDE 0.9 % IV SOLN: INTRAVENOUS | Status: AC

## 2020-11-01 MED ORDER — IOHEXOL 350 MG/ML SOLN
INTRAVENOUS | Status: DC | PRN
  Administered 2020-11-01: 35 mL via INTRACARDIAC

## 2020-11-01 MED ORDER — LIDOCAINE HCL 1 % IJ SOLN
INTRAMUSCULAR | Status: AC
  Filled 2020-11-01: qty 20

## 2020-11-01 MED ORDER — MIDAZOLAM HCL 2 MG/2ML IJ SOLN
INTRAMUSCULAR | Status: DC | PRN
  Administered 2020-11-01: 1 mg via INTRAVENOUS

## 2020-11-01 MED ORDER — SODIUM CHLORIDE 0.9 % IV SOLN: 250.0000 mL | INTRAVENOUS | Status: DC | PRN

## 2020-11-01 MED ORDER — HEPARIN SODIUM (PORCINE) 1000 UNIT/ML IJ SOLN
INTRAMUSCULAR | Status: AC
  Filled 2020-11-01: qty 1

## 2020-11-01 MED ORDER — LABETALOL HCL 5 MG/ML IV SOLN: 10.0000 mg | INTRAVENOUS | Status: AC | PRN

## 2020-11-01 MED ORDER — FENTANYL CITRATE (PF) 100 MCG/2ML IJ SOLN
INTRAMUSCULAR | Status: DC | PRN
  Administered 2020-11-01: 25 ug via INTRAVENOUS

## 2020-11-01 MED ORDER — LIDOCAINE HCL (PF) 1 % IJ SOLN
INTRAMUSCULAR | Status: DC | PRN
  Administered 2020-11-01: 2 mL

## 2020-11-01 MED ORDER — HEPARIN SODIUM (PORCINE) 1000 UNIT/ML IJ SOLN
INTRAMUSCULAR | Status: DC | PRN
  Administered 2020-11-01: 5000 [IU] via INTRAVENOUS

## 2020-11-01 MED ORDER — ASPIRIN 81 MG PO CHEW: 81.0000 mg | CHEWABLE_TABLET | ORAL | Status: DC

## 2020-11-01 MED ORDER — MIDAZOLAM HCL 2 MG/2ML IJ SOLN
INTRAMUSCULAR | Status: AC
  Filled 2020-11-01: qty 2

## 2020-11-01 MED ORDER — SODIUM CHLORIDE 0.9% FLUSH
3.0000 mL | Freq: Two times a day (BID) | INTRAVENOUS | Status: DC
  Administered 2020-11-01 – 2020-11-02 (×3): 3 mL via INTRAVENOUS

## 2020-11-01 MED ORDER — HEPARIN (PORCINE) IN NACL 1000-0.9 UT/500ML-% IV SOLN
INTRAVENOUS | Status: AC
  Filled 2020-11-01: qty 1000

## 2020-11-01 MED ORDER — SODIUM CHLORIDE 0.9% FLUSH
3.0000 mL | INTRAVENOUS | Status: DC | PRN
Start: 2020-11-01 — End: 2020-11-01

## 2020-11-01 MED ORDER — VERAPAMIL HCL 2.5 MG/ML IV SOLN
INTRAVENOUS | Status: AC
  Filled 2020-11-01: qty 2

## 2020-11-01 MED ORDER — HYDRALAZINE HCL 20 MG/ML IJ SOLN
10.0000 mg | Freq: Four times a day (QID) | INTRAMUSCULAR | Status: DC | PRN
  Administered 2020-11-01: 10 mg via INTRAVENOUS
  Filled 2020-11-01: qty 1

## 2020-11-01 MED ORDER — VERAPAMIL HCL 2.5 MG/ML IV SOLN
INTRAVENOUS | Status: DC | PRN
  Administered 2020-11-01: 2.5 mg via INTRA_ARTERIAL

## 2020-11-01 MED ORDER — SODIUM CHLORIDE 0.9% FLUSH
3.0000 mL | Freq: Two times a day (BID) | INTRAVENOUS | Status: DC
  Administered 2020-11-01 – 2020-11-02 (×2): 3 mL via INTRAVENOUS

## 2020-11-01 MED ORDER — ISOSORBIDE MONONITRATE ER 30 MG PO TB24
30.0000 mg | ORAL_TABLET | Freq: Every day | ORAL | Status: DC
  Administered 2020-11-01 – 2020-11-02 (×2): 30 mg via ORAL
  Filled 2020-11-01 (×2): qty 1

## 2020-11-01 MED ORDER — SODIUM CHLORIDE 0.9% FLUSH: 3.0000 mL | INTRAVENOUS | Status: DC | PRN

## 2020-11-01 MED ORDER — HEPARIN BOLUS VIA INFUSION
1300.0000 [IU] | Freq: Once | INTRAVENOUS | Status: AC
  Administered 2020-11-01: 1300 [IU] via INTRAVENOUS
  Filled 2020-11-01: qty 1300

## 2020-11-01 MED ORDER — SODIUM CHLORIDE 0.9 % WEIGHT BASED INFUSION
1.0000 mL/kg/h | INTRAVENOUS | Status: DC
  Administered 2020-11-01: 1 mL/kg/h via INTRAVENOUS

## 2020-11-01 MED ORDER — HEPARIN (PORCINE) IN NACL 1000-0.9 UT/500ML-% IV SOLN
INTRAVENOUS | Status: DC | PRN
  Administered 2020-11-01: 1000 mL

## 2020-11-01 MED ORDER — SODIUM CHLORIDE 0.9 % WEIGHT BASED INFUSION: 3.0000 mL/kg/h | INTRAVENOUS | Status: DC

## 2020-11-01 MED ORDER — HEPARIN (PORCINE) 25000 UT/250ML-% IV SOLN
1800.0000 [IU]/h | INTRAVENOUS | Status: DC
  Administered 2020-11-01: 1650 [IU]/h via INTRAVENOUS
  Administered 2020-11-02: 1800 [IU]/h via INTRAVENOUS
  Filled 2020-11-01 (×2): qty 250

## 2020-11-01 MED ORDER — FENTANYL CITRATE (PF) 100 MCG/2ML IJ SOLN
INTRAMUSCULAR | Status: AC
  Filled 2020-11-01: qty 2

## 2020-11-01 SURGICAL SUPPLY — 12 items
CATH 5F 110X4 TIG (CATHETERS) ×1 IMPLANT
DEVICE RAD TR BAND REGULAR (VASCULAR PRODUCTS) ×1 IMPLANT
DRAPE BRACHIAL (DRAPES) ×1 IMPLANT
GLIDESHEATH SLEND SS 6F .021 (SHEATH) ×1 IMPLANT
GUIDEWIRE INQWIRE 1.5J.035X260 (WIRE) IMPLANT
INQWIRE 1.5J .035X260CM (WIRE) ×2
MARKER SKIN DUAL TIP RULER LAB (MISCELLANEOUS) ×1 IMPLANT
PACK CARDIAC CATH (CUSTOM PROCEDURE TRAY) ×2 IMPLANT
PANNUS RETENTION SYSTEM 2 PAD (MISCELLANEOUS) ×1 IMPLANT
PROTECTION STATION PRESSURIZED (MISCELLANEOUS) ×2
SET ATX SIMPLICITY (MISCELLANEOUS) ×1 IMPLANT
STATION PROTECTION PRESSURIZED (MISCELLANEOUS) IMPLANT

## 2020-11-01 NOTE — Consult Note (Signed)
[place] for heparin Indication: atrial fibrillation  Allergies  Allergen Reactions   Codeine Other (See Comments)    REACTION: chest pain   Pioglitazone Other (See Comments)    REACTION to Actos: swelling in ankles    Patient Measurements: Height: 5\' 4"  (162.6 cm) Weight: 120.5 kg (265 lb 10.5 oz) IBW/kg (Calculated) : 59.2 Heparin Dosing Weight: 88kg  Vital Signs: Temp: 98 F (36.7 C) (10/19 0800) Temp Source: Oral (10/19 0800) BP: 127/54 (10/19 1100) Pulse Rate: 53 (10/19 1100)  Labs: Recent Labs    10/30/20 1413 10/31/20 0934 10/31/20 1058 10/31/20 1355 10/31/20 1651 11/01/20 0129 11/01/20 0702 11/01/20 0857 11/01/20 1206  HGB 15.4  --   --   --  13.9  --  13.6  --   --   HCT 45.6  --   --   --  43.7  --  43.3  --   --   PLT 192  --   --   --  198  --  177  --   --   APTT  --   --   --   --  85*  --   --   --   --   LABPROT  --   --   --   --  14.5  --   --   --   --   INR  --   --   --   --  1.1  --   --   --   --   HEPARINUNFRC  --   --   --   --   --  0.26*  --   --  0.31  CREATININE 1.42*  --   --  1.48*  --   --   --  1.55*  --   TROPONINIHS 132* 321* 288*  --   --   --   --   --   --      Estimated Creatinine Clearance: 51 mL/min (A) (by C-G formula based on SCr of 1.55 mg/dL (H)).   Medical History: Past Medical History:  Diagnosis Date   (HFpEF) heart failure with preserved ejection fraction ([place])    a. 09/2018 Echo: EF 60-65%. PASP 71mmHg. Mild-mod LAE. Mild MR/TR.   ANEMIA-IRON DEFICIENCY 07/26/2006   Anxiety    ASTHMA 07/26/2006   Asthma    Back pain, chronic    Bell's palsy    CKD (chronic kidney disease), stage III ([place])    COLONIC POLYPS, HX OF 07/26/2006   COVID-19 virus infection 01/2020   DEPRESSION 03/14/2009   DIABETES MELLITUS, TYPE II 07/26/2006   [place] DISEASE, LUMBAR 10/05/2007   DVT 12/03/2007   DYSLIPIDEMIA 04/13/2009   Dyspnea    when gets up and walks around and back is hurting really  bad -only Shortness of breath  then   GERD 07/26/2006   Gout    Heart murmur    History of kidney stones    HYPERTENSION 07/26/2006   INSOMNIA 08/21/2007   Neuropathy    Nonobstructive CAD (coronary artery disease)    a. 2012 Cath: no high grade stenosis; b. 2018 MV: No ischemia. Attenuation artifact.    OBSTRUCTIVE SLEEP APNEA 12/03/2007   Use C-PAP   PERIPHERAL NEUROPATHY 07/26/2006   Pernicious anemia 11/20/2006   PULMONARY EMBOLISM 10/05/2007    Medications:  Scheduled:   allopurinol  300 mg Oral Daily   aspirin EC  81 mg Oral Daily  atorvastatin  80 mg Oral Daily   carvedilol  3.125 mg Oral BID WC   furosemide  80 mg Intravenous Daily   gabapentin  1,200 mg Oral QHS   insulin aspart  0-15 Units Subcutaneous TID WC   isosorbide mononitrate  30 mg Oral Daily   losartan  25 mg Oral BID   potassium chloride  40 mEq Oral Daily   sodium chloride flush  3 mL Intravenous Q12H   sodium chloride flush  3 mL Intravenous Q12H   sodium chloride flush  3 mL Intravenous Q12H    Assessment: 75yo M with PMH of HFpEF, asthma, CKD, depression, DM, HLD, GERD, HTN, CAD, PE in 2009 who was admitted for syncope. Pt was found to be in afib and the pharmacy was consulted for heparin dosing.   Baseline CBC, PT/INR/aPTT ordered  No prior AC noted on PTA med list  Date/time  HL Interpretation/comment 10/19 0129 0.26 Subthera at 1350 units/hr 10/19 1206 0.31 Thera x1, at 1550 units/hr  Goal of Therapy:  Heparin level 0.3-0.7 units/ml Monitor platelets by anticoagulation protocol: Yes  10/19 0129 HL 0.26, subtherapeutic   Plan:  Heparin borderline therapeutic, will increase rate to 1650 units/hr Recheck HL level in 8 hours after rate change   Continue to monitor H&H and platelets with daily CBC while on heparin   Narda Rutherford, PharmD Pharmacy Resident  11/01/2020 12:53 PM

## 2020-11-01 NOTE — Progress Notes (Signed)
Patient did not want to wear CPAP at the moment.  Informed patient if he changes his mind, to let nurse know and I will provide a CPAP unit for him wear.

## 2020-11-01 NOTE — Interval H&P Note (Signed)
History and Physical Interval Note:  11/01/2020 1:37 PM  Lucas  has presented today for surgery, with the diagnosis of syncope, NSTEMI, and cardiomyopathy.  The various methods of treatment have been discussed with the patient and family. After consideration of risks, benefits and other options for treatment, the patient has consented to  Procedure(s): LEFT HEART CATH AND CORONARY ANGIOGRAPHY (N/A) as a surgical intervention.  The patient's history has been reviewed, patient examined, no change in status, stable for surgery.  I have reviewed the patient's chart and labs.  Questions were answered to the patient's satisfaction.    Cath Lab Visit (complete for each Cath Lab visit)  Clinical Evaluation Leading to the Procedure:   ACS: Yes.    Non-ACS:  N/A  Cami Delawder

## 2020-11-01 NOTE — ED Notes (Signed)
Pt ambulated to BR with steady gait and use of cane. Pt tolerated well. Pt changed into hospital gown . Belongings placed into belongings bag and given to spouse.

## 2020-11-01 NOTE — Progress Notes (Signed)
Progress Note  Patient Name: Donald [name] Date of Encounter: 11/01/2020  Primary Cardiologist: Ida Rogue, MD   Subjective   Reports an episode of chest pain earlier this morning. Chest pain occurred while standing/getting up from the bed earlier today and resolved shortly thereafter.  No recent dizziness or further syncope since presenting to the ED.  Reports improvement in volume status since start of IV Lasix. Breathing is currently stable.    He feels his volume status has improved, including his lower extremity edema.  Risks and benefits of MPI versus cardiac catheterization discussed with patient informed that MPI would need delayed until Thursday 10/20.  He has agreed for catheterization. Scheduled for cardiac catheterization later this afternoon.  Inpatient Medications    Scheduled Meds:  allopurinol  300 mg Oral Daily   aspirin EC  81 mg Oral Daily   atorvastatin  80 mg Oral Daily   carvedilol  3.125 mg Oral BID WC   furosemide  80 mg Intravenous Daily   gabapentin  1,200 mg Oral QHS   insulin aspart  0-15 Units Subcutaneous TID WC   losartan  25 mg Oral BID   potassium chloride  40 mEq Oral Daily   sodium chloride flush  3 mL Intravenous Q12H   sodium chloride flush  3 mL Intravenous Q12H   sodium chloride flush  3 mL Intravenous Q12H   Continuous Infusions:  sodium chloride     heparin 1,550 Units/hr (11/01/20 0347)   PRN Meds: sodium chloride, acetaminophen **OR** acetaminophen, colchicine, hydrALAZINE, HYDROcodone-acetaminophen, Naphazoline-Pheniramine, sodium chloride flush   Vital Signs    Vitals:   11/01/20 0730 11/01/20 0800 11/01/20 0821 11/01/20 0830  BP:  (!) 169/60 (!) 162/61 (!) 162/61  Pulse: (!) 48 (!) 52  (!) 57  Resp: (!) 25 (!) 30  20  Temp:  98 F (36.7 C)    TempSrc:  Oral    SpO2: 95% 96%  98%  Weight:      Height:        Intake/Output Summary (Last 24 hours) at 11/01/2020 0845 Last data filed at 11/01/2020  9323 Gross per 24 hour  Intake --  Output 1350 ml  Net -1350 ml   Last 3 Weights 10/30/2020 07/10/2020 06/21/2020  Weight (lbs) 265 lb 10.5 oz 265 lb 9.6 oz 267 lb 4 oz  Weight (kg) 120.5 kg 120.475 kg 121.224 kg      Telemetry    Sinus rhythm -sinus bradycardia, PVCs, 50s to 90s  - Personally Reviewed  ECG    No new tracings- Personally Reviewed  Physical Exam   GEN: Obese male, no acute distress Neck: JVD difficult to assess due to body habitus Cardiac: Distant heart sounds, RRR, no murmurs, rubs, or gallops.  Respiratory: Clear to auscultation bilaterally and improved from yesterday. GI: Obese, soft, nontender, non-distended  MS: Trace pretibial edema; No deformity. Neuro:  Nonfocal  Psych: Normal affect   Labs    High Sensitivity Troponin:   Recent Labs  Lab 10/30/20 1413 10/31/20 0934 10/31/20 1058  TROPONINIHS 132* 321* 288*      Chemistry Recent Labs  Lab 10/30/20 1413 10/31/20 1355  NA 141 138  K 2.9* 4.2  CL 98 102  CO2 29 29  GLUCOSE 88 286*  BUN 23 24*  CREATININE 1.42* 1.48*  CALCIUM 9.3 8.5*  PROT  --  5.6*  ALBUMIN  --  3.1*  AST  --  23  ALT  --  15  ALKPHOS  --  48  BILITOT  --  1.7*  GFRNONAA 53* 50*  ANIONGAP 14 7     Hematology Recent Labs  Lab 10/30/20 1413 10/31/20 1651 11/01/20 0702  WBC 9.8 6.5 6.2  RBC 5.16 4.94 4.82  HGB 15.4 13.9 13.6  HCT 45.6 43.7 43.3  MCV 88.4 88.5 89.8  MCH 29.8 28.1 28.2  MCHC 33.8 31.8 31.4  RDW 14.4 14.5 14.2  PLT 192 198 177    BNPNo results for input(s): BNP, PROBNP in the last 168 hours.   DDimer  Recent Labs  Lab 10/30/20 1413  DDIMER 0.96*     Radiology    DG Chest 1 View  Result Date: 10/31/2020 CLINICAL DATA:  History of asthma, heart failure presents after fall EXAM: CHEST  1 VIEW COMPARISON:  Chest radiograph 08/12/2018 FINDINGS: The cardiomediastinal silhouette is normal. There is no focal consolidation or pulmonary edema. There is no pleural effusion or  pneumothorax. There is no acute osseous abnormality. IMPRESSION: No radiographic evidence of acute cardiopulmonary process. Electronically Signed   By: Valetta Mole M.D.   On: 10/31/2020 11:10   CT HEAD WO CONTRAST (5MM)  Result Date: 10/30/2020 CLINICAL DATA:  Head trauma, minor (Age >= 65y).  Unwitnessed fall. EXAM: CT HEAD WITHOUT CONTRAST TECHNIQUE: Contiguous axial images were obtained from the base of the skull through the vertex without intravenous contrast. COMPARISON:  None. FINDINGS: Brain: No acute intracranial abnormality. Specifically, no hemorrhage, hydrocephalus, mass lesion, acute infarction, or significant intracranial injury. Vascular: No hyperdense vessel or unexpected calcification. Skull: No acute calvarial abnormality. Sinuses/Orbits: No acute findings Other: None IMPRESSION: No acute intracranial abnormality. Electronically Signed   By: Rolm Baptise M.D.   On: 10/30/2020 17:56   MR BRAIN WO CONTRAST  Result Date: 10/30/2020 CLINICAL DATA:  Syncopal episode. EXAM: MRI HEAD WITHOUT CONTRAST TECHNIQUE: Multiplanar, multiecho pulse sequences of the brain and surrounding structures were obtained without intravenous contrast. COMPARISON:  Head CT earlier same day. FINDINGS: Brain: Diffusion imaging does not show any acute or subacute infarction. No focal abnormality affects the brainstem or cerebellum. Cerebral hemispheres show mild age related volume loss with minimal small vessel change of the white matter, less than often seen at this age. No cortical or large vessel territory infarction. No mass lesion, hemorrhage, hydrocephalus or extra-axial collection. Vascular: Major vessels at the base of the brain show flow. Skull and upper cervical spine: Negative Sinuses/Orbits: Paranasal sinuses are clear. Orbits are negative. Previous lens implant on the left. Other: Left mastoid effusion. IMPRESSION: No acute intracranial finding. Mild age related volume loss and minimal small vessel change  of the hemispheric white matter. Left mastoid effusion. Electronically Signed   By: Nelson Chimes M.D.   On: 10/30/2020 20:39   NM Pulmonary Perfusion  Result Date: 10/31/2020 CLINICAL DATA:  PE suspected EXAM: NUCLEAR MEDICINE PERFUSION LUNG SCAN TECHNIQUE: Perfusion images were obtained in multiple projections after intravenous injection of radiopharmaceutical. Ventilation scans intentionally deferred if perfusion scan and chest x-ray adequate for interpretation during COVID 19 epidemic. RADIOPHARMACEUTICALS:  4.44 mCi Tc-89m MAA IV COMPARISON:  Same-day chest radiographs FINDINGS: Normal, homogeneous perfusion of the lungs. No suspicious filling defects. IMPRESSION: Very low probability examination for pulmonary embolism by modified perfusion only PIOPED criteria (PE absent). Electronically Signed   By: Delanna Ahmadi M.D.   On: 10/31/2020 11:03   US Carotid Bilateral  Result Date: 10/31/2020 CLINICAL DATA:  Syncope and collapse EXAM: BILATERAL CAROTID DUPLEX ULTRASOUND TECHNIQUE: Pearline Cables scale imaging, color Doppler and duplex ultrasound  were performed of bilateral carotid and vertebral arteries in the neck. COMPARISON:  None. FINDINGS: Criteria: Quantification of carotid stenosis is based on velocity parameters that correlate the residual internal carotid diameter with NASCET-based stenosis levels, using the diameter of the distal internal carotid lumen as the denominator for stenosis measurement. The following velocity measurements were obtained: RIGHT ICA: 68 cm/sec CCA: 85 cm/sec SYSTOLIC ICA/CCA RATIO:  0.9 ECA: 170 cm/sec LEFT ICA: 75 cm/sec CCA: 82 cm/sec SYSTOLIC ICA/CCA RATIO:  1.1 ECA: 136 cm/sec RIGHT CAROTID ARTERY: Antegrade RIGHT VERTEBRAL ARTERY:  Antegrade LEFT CAROTID ARTERY:  Antegrade LEFT VERTEBRAL ARTERY:  Antegrade Upper extremity blood pressures: Not provided Mild plaque in the distal common carotid arteries bilaterally, as well as at the bifurcation. IMPRESSION: Velocities consistent  with less than 50% stenosis in the bilateral carotid arteries. Electronically Signed   By: Merilyn Baba M.D.   On: 10/31/2020 01:49   US Venous Img Lower Bilateral (DVT)  Addendum Date: 10/31/2020   ADDENDUM REPORT: 10/31/2020 18:45 ADDENDUM: Voice recognition error in the original findings. Since this was a bilateral study, there was no contralateral femoral vein. Electronically Signed   By: Rolm Baptise M.D.   On: 10/31/2020 18:45   Result Date: 10/31/2020 CLINICAL DATA:  Bilateral lower extremity swelling EXAM: BILATERAL LOWER EXTREMITY VENOUS DOPPLER ULTRASOUND TECHNIQUE: Gray-scale sonography with compression, as well as color and duplex ultrasound, were performed to evaluate the deep venous system(s) from the level of the common femoral vein through the popliteal and proximal calf veins. COMPARISON:  None. FINDINGS: VENOUS Normal compressibility of the common femoral, superficial femoral, and popliteal veins, as well as the visualized calf veins. Visualized portions of profunda femoral vein and great saphenous vein unremarkable. No filling defects to suggest DVT on grayscale or color Doppler imaging. Doppler waveforms show normal direction of venous flow, normal respiratory plasticity and response to augmentation. Limited views of the contralateral common femoral vein are unremarkable. OTHER None. Limitations: none IMPRESSION: Negative. Electronically Signed: By: Rolm Baptise M.D. On: 10/31/2020 18:41   ECHOCARDIOGRAM COMPLETE  Result Date: 10/31/2020    ECHOCARDIOGRAM REPORT   Patient Name:   DAIVION PAPE Date of Exam: 10/31/2020 Medical Rec #:  [id number]         Height:       64.0 in Accession #:    [id number]        Weight:       265.7 lb Date of Birth:  1945/09/21        BSA:          2.207 m Patient Age:    24 years          BP:           143/54 mmHg Patient Gender: M                 HR:           56 bpm. Exam Location:  [facility] Procedure: 2D Echo, Color Doppler, Cardiac Doppler and  Intracardiac            Opacification Agent Indications:     R55 Syncope  History:         Patient has prior history of Echocardiogram examinations, most                  recent 10/01/2018. HFrEF; Risk Factors:Hypertension, Diabetes                  and Sleep Apnea.  Sonographer:  Charmayne Sheer Referring Phys:  [id number] Gretta Cool PATEL Diagnosing Phys: Harrell Gave End MD  Sonographer Comments: Suboptimal apical window and suboptimal subcostal window. IMPRESSIONS  1. Left ventricular ejection fraction, by estimation, is 45 to 50%. The left ventricle has mildly decreased function. The left ventricle demonstrates global hypokinesis. There is moderate left ventricular hypertrophy. Left ventricular diastolic parameters are consistent with Grade III diastolic dysfunction (restrictive). Elevated left atrial pressure.  2. Right ventricular systolic function is normal. The right ventricular size is normal.  3. Left atrial size was mildly dilated.  4. The mitral valve is degenerative. Mild mitral valve regurgitation. No evidence of mitral stenosis.  5. The aortic valve has an indeterminant number of cusps. There is mild calcification of the aortic valve. There is moderate thickening of the aortic valve. Aortic valve regurgitation is trivial. Mild to moderate aortic valve sclerosis/calcification is present, without any evidence of aortic stenosis. FINDINGS  Left Ventricle: Left ventricular ejection fraction, by estimation, is 45 to 50%. The left ventricle has mildly decreased function. The left ventricle demonstrates global hypokinesis. Definity contrast agent was given IV to delineate the left ventricular  endocardial borders. The left ventricular internal cavity size was normal in size. There is moderate left ventricular hypertrophy. Left ventricular diastolic parameters are consistent with Grade III diastolic dysfunction (restrictive). Elevated left atrial pressure. Right Ventricle: The right ventricular size is normal. No  increase in right ventricular wall thickness. Right ventricular systolic function is normal. Left Atrium: Left atrial size was mildly dilated. Right Atrium: Right atrial size was not well visualized. Pericardium: The pericardium was not well visualized. Mitral Valve: The mitral valve is degenerative in appearance. There is mild thickening of the mitral valve leaflet(s). There is mild calcification of the mitral valve leaflet(s). Mild mitral valve regurgitation. No evidence of mitral valve stenosis. MV peak gradient, 4.8 mmHg. The mean mitral valve gradient is 1.0 mmHg. Tricuspid Valve: The tricuspid valve is not well visualized. Tricuspid valve regurgitation is not demonstrated. Aortic Valve: The aortic valve has an indeterminant number of cusps. There is mild calcification of the aortic valve. There is moderate thickening of the aortic valve. There is mild aortic valve annular calcification. Aortic valve regurgitation is trivial. Mild to moderate aortic valve sclerosis/calcification is present, without any evidence of aortic stenosis. Aortic valve mean gradient measures 7.0 mmHg. Aortic valve peak gradient measures 11.7 mmHg. Aortic valve area, by VTI measures 2.78 cm. Pulmonic Valve: The pulmonic valve was not well visualized. Pulmonic valve regurgitation is not visualized. No evidence of pulmonic stenosis. Aorta: The aortic root is normal in size and structure. Pulmonary Artery: The pulmonary artery is of normal size. IAS/Shunts: The interatrial septum was not well visualized.  LEFT VENTRICLE PLAX 2D LVIDd:         5.10 cm      Diastology LVIDs:         3.93 cm      LV e' medial:   6.31 cm/s LV PW:         1.53 cm      LV E/e' medial: 17.0 LV IVS:        1.31 cm LVOT diam:     2.20 cm LV SV:         108 LV SV Index:   49 LVOT Area:     3.80 cm  LV Volumes (MOD) LV vol d, MOD A2C: 158.0 ml LV vol d, MOD A4C: 187.0 ml LV vol s, MOD A2C: 94.2 ml LV vol s,  MOD A4C: 129.0 ml LV SV MOD A2C:     63.8 ml LV SV MOD A4C:      187.0 ml LV SV MOD BP:      62.0 ml LEFT ATRIUM             Index LA diam:        4.10 cm 1.86 cm/m LA Vol (A2C):   67.8 ml 30.72 ml/m LA Vol (A4C):   67.2 ml 30.45 ml/m LA Biplane Vol: 70.9 ml 32.12 ml/m  AORTIC VALVE                     PULMONIC VALVE AV Area (Vmax):    2.87 cm      PV Vmax:       1.00 m/s AV Area (Vmean):   2.67 cm      PV Vmean:      68.300 cm/s AV Area (VTI):     2.78 cm      PV VTI:        0.203 m AV Vmax:           171.00 cm/s   PV Peak grad:  4.0 mmHg AV Vmean:          126.000 cm/s  PV Mean grad:  2.0 mmHg AV VTI:            0.388 m AV Peak Grad:      11.7 mmHg AV Mean Grad:      7.0 mmHg LVOT Vmax:         129.00 cm/s LVOT Vmean:        88.600 cm/s LVOT VTI:          0.284 m LVOT/AV VTI ratio: 0.73  AORTA Ao Root diam: 3.30 cm MITRAL VALVE MV Area (PHT): 3.77 cm     SHUNTS MV Area VTI:   3.77 cm     Systemic VTI:  0.28 m MV Peak grad:  4.8 mmHg     Systemic Diam: 2.20 cm MV Mean grad:  1.0 mmHg MV Vmax:       1.09 m/s MV Vmean:      54.1 cm/s MV Decel Time: 201 msec MV E velocity: 107.00 cm/s MV A velocity: 39.80 cm/s MV E/A ratio:  2.69 Harrell Gave End MD Electronically signed by Nelva Bush MD Signature Date/Time: 10/31/2020/12:38:25 PM    Final     Cardiac Studies   Echo 10/31/20  1. Left ventricular ejection fraction, by estimation, is 45 to 50%. The  left ventricle has mildly decreased function. The left ventricle  demonstrates global hypokinesis. There is moderate left ventricular  hypertrophy. Left ventricular diastolic  parameters are consistent with Grade III diastolic dysfunction  (restrictive). Elevated left atrial pressure.   2. Right ventricular systolic function is normal. The right ventricular  size is normal.   3. Left atrial size was mildly dilated.   4. The mitral valve is degenerative. Mild mitral valve regurgitation. No  evidence of mitral stenosis.   5. The aortic valve has an indeterminant number of cusps. There is mild   calcification of the aortic valve. There is moderate thickening of the  aortic valve. Aortic valve regurgitation is trivial. Mild to moderate  aortic valve sclerosis/calcification is  present, without any evidence of aortic stenosis.   MPI 02/2016 Pharmacological myocardial perfusion imaging study with no significant  ischemia There is a small region of mild fixed perfusion defect in the inferoapical/apical region. Unable to exclude small  region of old infarct vs attenuation artifact. Nonattenuation corrected images with large region of fixed inferior wall perfusion defect consistent with diaphragmatic attenuation artifact. Small region of possible hypokinesis/wall motion abnormality in the basal inferoseptal wall, EF estimated at 40% No EKG changes concerning for ischemia at peak stress or in recovery. Resting EKG with T wave abn in V5 and V6 Low risk scan    Patient Profile     75 y.o. male with hx of HFpEF (EF 60%-65% 45-50%), nonobstructive coronary artery disease (cath 2012 with no critical high grade obstructive dz, elevated LVEDP), Afib (newly dx 10/18), poorly controlled and insulin dependent diabetes, hyperlipidemia, PAD, 2009 DVT and extensive bilateral pulmonary embolism, CKD 3, OSA on CPAP, asthmatic bronchitis, morbid obesity, gout, GERD, anemia, history of hematuria, s/p parathyroidectomy (right sided, 2018), hypercalcemia, chronic back pain s/p surgeries, and who is being seen today for the evaluation of syncope with EF found to be 45-50% and new Afib at presentation.  Assessment & Plan    Syncopal episode  -- Etiology unclear. Syncope 10/17 without preceding sx and estimated LOC 30 minutes.  Given lack of prodromal sx, concern for syncope 2/2 arrhythmia. EKG with new onset Afib followed by followed by spontaneous conversion to NSR, raising concern for syncope in the setting of PAF with post-termination pauses. Telemetry so far SB -NSR with ectopy and without significant  arrhythmia / pauses. Repeat EKG SB, 1st degree AVB, poor R wave progression, nonspecific ST/T changes. Additional workup includes initial labs with significant hypokalemia, TSH WNL, echo with EF 45 to 50%, HS Tn pk 321, VQ with low suspicion for PE, carotids < 50%s, and CT head/MRI brain that were not not acute.    Continue to monitor on telemetry.  Maintain electrolytes at goal.  Consider discharge with [facility] -AT x2 weeks and EP consultation if etiology of syncope not identified this admission No driving for 6 months 2/2 syncope of unclear etiology Further recommendations regarding results of additional work-up as directly below   New onset atrial fibrillation  -- Presented with new onset atrial fibrillation of unknown chronicity.  Currently SR-SB. Hypokalemic at presentation with K now repleted.  Continue Coreg for rate control Continue IV heparin with daily CBC for anticoagulation CHA2DS2VASc score of at least 5 (CHF, HTN, DM2, agex1, vascular)  Transition to NOAC before discharge.    New systolic heart failure with known diastolic heart failure -- Reports chronic dyspnea.  Echo shows EF 60-65%  45 to 50%, LV global hypokinesis, grade 3 diastolic dysfunction.   Continue IV Lasix  Monitor I's/O's, daily weights BMET ordered, pending.  Continue Coreg and losartan.   Given his mildly reduced EF, further ischemic work-up recommended with catheterization scheduled for later today.   Non-STEMI History of nonobstructive coronary artery disease (2012) -- Reports chest pain earlier this morning as above.  Previous 2012 LHC with nonobstructive CAD. HS Tn peaked 321 and downtrending.  Given his reduced EF, further ischemic work-up recommended.   N.p.o. today  LHC scheduled for later today.   Continue IV heparin  Started Imdur 30 mg daily given elevated BP & CP this AM. Continue ASA, Coreg, and Lipitor. Shared Decision Making/Informed Consent The risks [stroke (1 in 1000), death (1 in 1000),  kidney failure [usually temporary] (1 in 500), bleeding (1 in 200), allergic reaction [possibly serious] (1 in 200)], benefits (diagnostic support and management of coronary artery disease) and alternatives of a cardiac catheterization were discussed in detail with Donald [name] and he is willing  to proceed.    Elevated D-dimer, history of pulmonary embolism --Previous history of pulmonary embolism.  Elevated D-dimer at presentation.  Subsequent VQ scan with low suspicion for pulmonary embolism.  No evidence of right heart strain on echo.    Hypokalemia --Resolved.  Presented significantly hypokalemic after self discontinuing his potassium supplement due to nephrology recommendations per patient report.   Essential hypertension -- BP suboptimal this morning.  Continue current medications and titrate as needed for BP support.    For questions or updates, please contact [place] Please consult [email or web address] for contact info under        Signed, Arvil Chaco, PA-C  11/01/2020, 8:45 AM

## 2020-11-01 NOTE — Progress Notes (Signed)
PT Cancellation Note  Patient Details Name: EARNIE BECHARD MRN: [id number] DOB: 1945/04/06   Cancelled Treatment:    Reason Eval/Treat Not Completed: Medical issues which prohibited therapy. Patient having heart cath today. Will continue to monitor and see when appropriate.    Mailin Coglianese 11/01/2020, 9:32 AM

## 2020-11-01 NOTE — Consult Note (Signed)
ANTICOAGULATION CONSULT NOTE   Pharmacy Consult for heparin Indication: atrial fibrillation  Allergies  Allergen Reactions   Codeine Other (See Comments)    REACTION: chest pain   Pioglitazone Other (See Comments)    REACTION to Actos: swelling in ankles    Patient Measurements: Height: 5\' 4"  (162.6 cm) Weight: 116.8 kg (257 lb 6.4 oz) IBW/kg (Calculated) : 59.2 Heparin Dosing Weight: 88kg  Vital Signs: Temp: 97.8 F (36.6 C) (10/19 1558) Temp Source: Axillary (10/19 1558) BP: 146/63 (10/19 1558) Pulse Rate: 51 (10/19 1558)  Labs: Recent Labs    10/30/20 1413 10/31/20 0934 10/31/20 1058 10/31/20 1355 10/31/20 1651 11/01/20 0129 11/01/20 0702 11/01/20 0857 11/01/20 1206  HGB 15.4  --   --   --  13.9  --  13.6  --   --   HCT 45.6  --   --   --  43.7  --  43.3  --   --   PLT 192  --   --   --  198  --  177  --   --   APTT  --   --   --   --  85*  --   --   --   --   LABPROT  --   --   --   --  14.5  --   --   --   --   INR  --   --   --   --  1.1  --   --   --   --   HEPARINUNFRC  --   --   --   --   --  0.26*  --   --  0.31  CREATININE 1.42*  --   --  1.48*  --   --   --  1.55*  --   TROPONINIHS 132* 321* 288*  --   --   --   --   --   --      Estimated Creatinine Clearance: 50.1 mL/min (A) (by C-G formula based on SCr of 1.55 mg/dL (H)).   Medical History: Past Medical History:  Diagnosis Date   (HFpEF) heart failure with preserved ejection fraction ([place])    a. 09/2018 Echo: EF 60-65%. PASP 25mmHg. Mild-mod LAE. Mild MR/TR.   ANEMIA-IRON DEFICIENCY 07/26/2006   Anxiety    ASTHMA 07/26/2006   Asthma    Back pain, chronic    Bell's palsy    CKD (chronic kidney disease), stage III ([place])    COLONIC POLYPS, HX OF 07/26/2006   COVID-19 virus infection 01/2020   DEPRESSION 03/14/2009   DIABETES MELLITUS, TYPE II 07/26/2006   [place] DISEASE, LUMBAR 10/05/2007   DVT 12/03/2007   DYSLIPIDEMIA 04/13/2009   Dyspnea    when gets up and walks around and back is hurting  really bad -only Shortness of breath  then   GERD 07/26/2006   Gout    Heart murmur    History of kidney stones    HYPERTENSION 07/26/2006   INSOMNIA 08/21/2007   Neuropathy    Nonobstructive CAD (coronary artery disease)    a. 2012 Cath: no high grade stenosis; b. 2018 MV: No ischemia. Attenuation artifact.    OBSTRUCTIVE SLEEP APNEA 12/03/2007   Use C-PAP   PERIPHERAL NEUROPATHY 07/26/2006   Pernicious anemia 11/20/2006   PULMONARY EMBOLISM 10/05/2007    Medications:  Scheduled:   allopurinol  300 mg Oral Daily   aspirin EC  81 mg Oral Daily  atorvastatin  80 mg Oral Daily   carvedilol  3.125 mg Oral BID WC   gabapentin  1,200 mg Oral QHS   insulin aspart  0-15 Units Subcutaneous TID WC   isosorbide mononitrate  30 mg Oral Daily   losartan  25 mg Oral BID   potassium chloride  40 mEq Oral Daily   sodium chloride flush  3 mL Intravenous Q12H   sodium chloride flush  3 mL Intravenous Q12H   sodium chloride flush  3 mL Intravenous Q12H   sodium chloride flush  3 mL Intravenous Q12H    Assessment: 75yo M with PMH of HFpEF, asthma, CKD, depression, DM, HLD, GERD, HTN, CAD, PE in 2009 who was admitted for syncope. Pt was found to be in afib and the pharmacy was consulted for heparin dosing.   Baseline CBC, PT/INR/aPTT ordered  No prior AC noted on PTA med list  Date/time  HL Interpretation/comment 10/19 0129 0.26 Subthera at 1350 units/hr 10/19 1206 0.31 Thera x1, at 1550 >1650 units/hr --- 10/19 1600 --- TR-band removed 10/19 1800 -- Resume 1650 un/hr  Goal of Therapy:  Heparin level 0.3-0.7 units/ml Monitor platelets by anticoagulation protocol: Yes   Plan:  Consulted to resume heparin 2hrs post TR band removal per Cards. Discussed with RN and TR-Band removed at 1600; timed restart of heparin at previous rate 1650 un/h and no bolus at 1800. Recheck HL level in 8 hours after rate change   (10/20 0200) Continue to monitor H&H and platelets with daily CBC while on heparin    Lorna Dibble, PharmD, [facility] Clinical Pharmacist 11/01/2020 5:57 PM

## 2020-11-01 NOTE — Telephone Encounter (Signed)
Please clarify as we will not have results from ZIO within a 2 week period therefore it will not qualify as a TCM

## 2020-11-01 NOTE — Telephone Encounter (Signed)
-----   Message from Arvil Chaco, PA-C sent at 11/01/2020  3:52 PM EDT ----- Regarding: Monitor follow-up Hello,  This patient was admitted and seen at [facility] for syncope.    We are expecting discharge tomorrow 11/02/2020.   Respiratory therapy will place a Zio AT monitor on him before discharge.  Can you please call and arrange / schedule for follow-up TCM appointment with Dr. Rockey Situ or APP after his monitor?  Of note: He also had a LHC during admission.  Per Dr. Saunders Revel, it is acceptable for the patient to wait for follow-up until after his monitor- he does not need scheduled within the next 2 weeks.  Thank you! Signed, Arvil Chaco, PA-C 11/01/2020, 3:53 PM Pager [phone]

## 2020-11-01 NOTE — Progress Notes (Signed)
Zio-AT x2 weeks ordered for syncope of unclear etiology.

## 2020-11-01 NOTE — Progress Notes (Addendum)
PROGRESS NOTE    Donald [name]  FVC:[id number] DOB: 01-08-1946 DOA: 10/30/2020 PCP: Ria Bush, MD   Assessment & Plan:   Principal Problem:   Syncope and collapse Active Problems:   Iron deficiency anemia   Essential hypertension   History of pulmonary embolus (PE)   GERD   OSA (obstructive sleep apnea)   CAD (coronary artery disease)   CKD stage 3 due to type 2 diabetes mellitus ([place])   Type 2 diabetes mellitus with diabetic chronic kidney disease ([place])     Syncope: etiology unclear. Echo showed EF of 45 to 50% with global hypokinesis, grade III diastolic dysfunction. Carotid ultrasound less than 50% stenosis bilaterally. MRI with no acute intracranial findings. Zio patch ordered as per cardio    Acute on chronic combined CHF: EF 63-84%,YKZLD III diastolic dysfunction, AS. S/p cardiac cath which showed mild nonobstructive CAD, mild-mod AS as per cardio. Continue on losartan, imdur, carvedilol.  A. fib: new onset. Possibly PAF. Continue on carvedilol.   NSTEMI: as per cardio. S/p cardiac cath which showed mild nonobstructive CAD, mild-mod AS as per cardio. Continue on losartan, imdur, carvedilol.   CKDIIIa: Cr is trending up from day prior. Avoid nephrotoxic meds    IDA: resolved    HTN: continue on losartan, carvedilol, imdur    History of PE: in 2009. VQ scan showed low probability of PE. [name] of b/l LE neg for DVT    OSA: does not use CPAP   CAD: continue on aspirin, statin   DM2: likely poorly controlled. Continue on SSI w/ accuchecks.  Morbid obesity: BMI 45.6. Complicates overall care & prognosis     DVT prophylaxis: SCDs Code Status: full  Family Communication: discussed pt's care w/ pt's family at bedside and answered their questions  Disposition Plan: likely d/c back home   Level of care: Progressive Cardiac  Status is: Inpatient  Remains inpatient appropriate because: severity of illness, s/p cardiac cath today      Consultants:     Procedures: cardiac cath today   Antimicrobials:  Subjective: Pt c/o intermittent chest discomfort   Objective: Vitals:   11/01/20 0800 11/01/20 0821 11/01/20 0830 11/01/20 0845  BP: (!) 169/60 (!) 162/61 (!) 162/61 (!) 163/58  Pulse: (!) 52  (!) 57 60  Resp: (!) 30  20 16   Temp: 98 F (36.7 C)     TempSrc: Oral     SpO2: 96%  98% 94%  Weight:      Height:        Intake/Output Summary (Last 24 hours) at 11/01/2020 0916 Last data filed at 11/01/2020 0707 Gross per 24 hour  Intake --  Output 1350 ml  Net -1350 ml   Filed Weights   10/30/20 1631  Weight: 120.5 kg    Examination:  General exam: Appears calm and comfortable  Respiratory system: Clear to auscultation. Respiratory effort normal. Cardiovascular system: S1 & S2 +. No rubs, gallops or clicks.  Gastrointestinal system: Abdomen is obese, soft and nontender. Normal bowel sounds heard. Central nervous system: Alert and oriented. Moves all extremities  Psychiatry: Judgement and insight appear normal. Flat mood and affect    Data Reviewed: I have personally reviewed following labs and imaging studies  CBC: Recent Labs  Lab 10/30/20 1413 10/31/20 1651 11/01/20 0702  WBC 9.8 6.5 6.2  HGB 15.4 13.9 13.6  HCT 45.6 43.7 43.3  MCV 88.4 88.5 89.8  PLT 192 198 357   Basic Metabolic Panel: Recent Labs  Lab 10/30/20  1413 10/31/20 1355  NA 141 138  K 2.9* 4.2  CL 98 102  CO2 29 29  GLUCOSE 88 286*  BUN 23 24*  CREATININE 1.42* 1.48*  CALCIUM 9.3 8.5*  MG  --  1.8   GFR: Estimated Creatinine Clearance: 53.4 mL/min (A) (by C-G formula based on SCr of 1.48 mg/dL (H)). Liver Function Tests: Recent Labs  Lab 10/31/20 1355  AST 23  ALT 15  ALKPHOS 72  BILITOT 1.7*  PROT 5.6*  ALBUMIN 3.1*   No results for input(s): LIPASE, AMYLASE in the last 168 hours. No results for input(s): AMMONIA in the last 168 hours. Coagulation Profile: Recent Labs  Lab 10/31/20 1651  INR 1.1   Cardiac  Enzymes: No results for input(s): CKTOTAL, CKMB, CKMBINDEX, TROPONINI in the last 168 hours. BNP (last 3 results) No results for input(s): PROBNP in the last 8760 hours. HbA1C: Recent Labs    10/30/20 1413  HGBA1C 7.0*   CBG: Recent Labs  Lab 10/31/20 0927 10/31/20 1055 10/31/20 1638 10/31/20 2159 11/01/20 0704  GLUCAP 201* 202* 256* 245* 270*   Lipid Profile: Recent Labs    10/31/20 1355  CHOL 119  HDL 25*  LDLCALC 51  TRIG 214*  CHOLHDL 4.8   Thyroid Function Tests: Recent Labs    10/30/20 1413 10/31/20 1355  TSH 2.448 2.156  FREET4 1.04  --    Anemia Panel: No results for input(s): VITAMINB12, FOLATE, FERRITIN, TIBC, IRON, RETICCTPCT in the last 72 hours. Sepsis Labs: No results for input(s): PROCALCITON, LATICACIDVEN in the last 168 hours.  Recent Results (from the past 240 hour(s))  Resp Panel by RT-PCR (Flu A&B, Covid) Nasopharyngeal Swab     Status: None   Collection Time: 10/30/20  5:04 PM   Specimen: Nasopharyngeal Swab; Nasopharyngeal(NP) swabs in vial transport medium  Result Value Ref Range Status   SARS Coronavirus 2 by RT PCR NEGATIVE NEGATIVE Final    Comment: (NOTE) SARS-CoV-2 target nucleic acids are NOT DETECTED.  The SARS-CoV-2 RNA is generally detectable in upper respiratory specimens during the acute phase of infection. The lowest concentration of SARS-CoV-2 viral copies this assay can detect is 138 copies/mL. A negative result does not preclude SARS-Cov-2 infection and should not be used as the sole basis for treatment or other patient management decisions. A negative result may occur with  improper specimen collection/handling, submission of specimen other than nasopharyngeal swab, presence of viral mutation(s) within the areas targeted by this assay, and inadequate number of viral copies(<138 copies/mL). A negative result must be combined with clinical observations, patient history, and epidemiological information. The expected  result is Negative.  Fact Sheet for Patients:  EntrepreneurPulse.com.au  Fact Sheet for Healthcare Providers:  IncredibleEmployment.be  This test is no t yet approved or cleared by the [place] FDA and  has been authorized for detection and/or diagnosis of SARS-CoV-2 by FDA under an Emergency Use Authorization (EUA). This EUA will remain  in effect (meaning this test can be used) for the duration of the COVID-19 declaration under Section 564(b)(1) of the Act, 21 U.S.C.section 360bbb-3(b)(1), unless the authorization is terminated  or revoked sooner.       Influenza A by PCR NEGATIVE NEGATIVE Final   Influenza B by PCR NEGATIVE NEGATIVE Final    Comment: (NOTE) The Xpert Xpress SARS-CoV-2/FLU/RSV plus assay is intended as an aid in the diagnosis of influenza from Nasopharyngeal swab specimens and should not be used as a sole basis for treatment. Nasal washings and  aspirates are unacceptable for Xpert Xpress SARS-CoV-2/FLU/RSV testing.  Fact Sheet for Patients: EntrepreneurPulse.com.au  Fact Sheet for Healthcare Providers: IncredibleEmployment.be  This test is not yet approved or cleared by the [place] FDA and has been authorized for detection and/or diagnosis of SARS-CoV-2 by FDA under an Emergency Use Authorization (EUA). This EUA will remain in effect (meaning this test can be used) for the duration of the COVID-19 declaration under Section 564(b)(1) of the Act, 21 U.S.C. section 360bbb-3(b)(1), unless the authorization is terminated or revoked.  Performed at [facility], [address] [place], [name] [address]          Radiology Studies: DG Chest 1 View  Result Date: 10/31/2020 CLINICAL DATA:  History of asthma, heart failure presents after fall EXAM: CHEST  1 VIEW COMPARISON:  Chest radiograph 08/12/2018 FINDINGS: The cardiomediastinal silhouette is normal. There is  no focal consolidation or pulmonary edema. There is no pleural effusion or pneumothorax. There is no acute osseous abnormality. IMPRESSION: No radiographic evidence of acute cardiopulmonary process. Electronically Signed   By: Valetta Mole M.D.   On: 10/31/2020 11:10   CT HEAD WO CONTRAST (5MM)  Result Date: 10/30/2020 CLINICAL DATA:  Head trauma, minor (Age >= 65y).  Unwitnessed fall. EXAM: CT HEAD WITHOUT CONTRAST TECHNIQUE: Contiguous axial images were obtained from the base of the skull through the vertex without intravenous contrast. COMPARISON:  None. FINDINGS: Brain: No acute intracranial abnormality. Specifically, no hemorrhage, hydrocephalus, mass lesion, acute infarction, or significant intracranial injury. Vascular: No hyperdense vessel or unexpected calcification. Skull: No acute calvarial abnormality. Sinuses/Orbits: No acute findings Other: None IMPRESSION: No acute intracranial abnormality. Electronically Signed   By: Rolm Baptise M.D.   On: 10/30/2020 17:56   MR BRAIN WO CONTRAST  Result Date: 10/30/2020 CLINICAL DATA:  Syncopal episode. EXAM: MRI HEAD WITHOUT CONTRAST TECHNIQUE: Multiplanar, multiecho pulse sequences of the brain and surrounding structures were obtained without intravenous contrast. COMPARISON:  Head CT earlier same day. FINDINGS: Brain: Diffusion imaging does not show any acute or subacute infarction. No focal abnormality affects the brainstem or cerebellum. Cerebral hemispheres show mild age related volume loss with minimal small vessel change of the white matter, less than often seen at this age. No cortical or large vessel territory infarction. No mass lesion, hemorrhage, hydrocephalus or extra-axial collection. Vascular: Major vessels at the base of the brain show flow. Skull and upper cervical spine: Negative Sinuses/Orbits: Paranasal sinuses are clear. Orbits are negative. Previous lens implant on the left. Other: Left mastoid effusion. IMPRESSION: No acute  intracranial finding. Mild age related volume loss and minimal small vessel change of the hemispheric white matter. Left mastoid effusion. Electronically Signed   By: Nelson Chimes M.D.   On: 10/30/2020 20:39   NM Pulmonary Perfusion  Result Date: 10/31/2020 CLINICAL DATA:  PE suspected EXAM: NUCLEAR MEDICINE PERFUSION LUNG SCAN TECHNIQUE: Perfusion images were obtained in multiple projections after intravenous injection of radiopharmaceutical. Ventilation scans intentionally deferred if perfusion scan and chest x-ray adequate for interpretation during COVID 19 epidemic. RADIOPHARMACEUTICALS:  4.44 mCi Tc-21m MAA IV COMPARISON:  Same-day chest radiographs FINDINGS: Normal, homogeneous perfusion of the lungs. No suspicious filling defects. IMPRESSION: Very low probability examination for pulmonary embolism by modified perfusion only PIOPED criteria (PE absent). Electronically Signed   By: Delanna Ahmadi M.D.   On: 10/31/2020 11:03   US Carotid Bilateral  Result Date: 10/31/2020 CLINICAL DATA:  Syncope and collapse EXAM: BILATERAL CAROTID DUPLEX ULTRASOUND TECHNIQUE: Pearline Cables scale imaging, color Doppler  and duplex ultrasound were performed of bilateral carotid and vertebral arteries in the neck. COMPARISON:  None. FINDINGS: Criteria: Quantification of carotid stenosis is based on velocity parameters that correlate the residual internal carotid diameter with NASCET-based stenosis levels, using the diameter of the distal internal carotid lumen as the denominator for stenosis measurement. The following velocity measurements were obtained: RIGHT ICA: 68 cm/sec CCA: 85 cm/sec SYSTOLIC ICA/CCA RATIO:  0.9 ECA: 170 cm/sec LEFT ICA: 75 cm/sec CCA: 82 cm/sec SYSTOLIC ICA/CCA RATIO:  1.1 ECA: 136 cm/sec RIGHT CAROTID ARTERY: Antegrade RIGHT VERTEBRAL ARTERY:  Antegrade LEFT CAROTID ARTERY:  Antegrade LEFT VERTEBRAL ARTERY:  Antegrade Upper extremity blood pressures: Not provided Mild plaque in the distal common carotid  arteries bilaterally, as well as at the bifurcation. IMPRESSION: Velocities consistent with less than 50% stenosis in the bilateral carotid arteries. Electronically Signed   By: Merilyn Baba M.D.   On: 10/31/2020 01:49   US Venous Img Lower Bilateral (DVT)  Addendum Date: 10/31/2020   ADDENDUM REPORT: 10/31/2020 18:45 ADDENDUM: Voice recognition error in the original findings. Since this was a bilateral study, there was no contralateral femoral vein. Electronically Signed   By: Rolm Baptise M.D.   On: 10/31/2020 18:45   Result Date: 10/31/2020 CLINICAL DATA:  Bilateral lower extremity swelling EXAM: BILATERAL LOWER EXTREMITY VENOUS DOPPLER ULTRASOUND TECHNIQUE: Gray-scale sonography with compression, as well as color and duplex ultrasound, were performed to evaluate the deep venous system(s) from the level of the common femoral vein through the popliteal and proximal calf veins. COMPARISON:  None. FINDINGS: VENOUS Normal compressibility of the common femoral, superficial femoral, and popliteal veins, as well as the visualized calf veins. Visualized portions of profunda femoral vein and great saphenous vein unremarkable. No filling defects to suggest DVT on grayscale or color Doppler imaging. Doppler waveforms show normal direction of venous flow, normal respiratory plasticity and response to augmentation. Limited views of the contralateral common femoral vein are unremarkable. OTHER None. Limitations: none IMPRESSION: Negative. Electronically Signed: By: Rolm Baptise M.D. On: 10/31/2020 18:41   ECHOCARDIOGRAM COMPLETE  Result Date: 10/31/2020    ECHOCARDIOGRAM REPORT   Patient Name:   Donald [name] Date of Exam: 10/31/2020 Medical Rec #:  [id number]         Height:       64.0 in Accession #:    [id number]        Weight:       265.7 lb Date of Birth:  Feb 17, 1945        BSA:          2.207 m Patient Age:    89 years          BP:           143/54 mmHg Patient Gender: M                 HR:           56  bpm. Exam Location:  [facility] Procedure: 2D Echo, Color Doppler, Cardiac Doppler and Intracardiac            Opacification Agent Indications:     R55 Syncope  History:         Patient has prior history of Echocardiogram examinations, most                  recent 10/01/2018. HFrEF; Risk Factors:Hypertension, Diabetes                  and Sleep  Apnea.  Sonographer:     Charmayne Sheer Referring Phys:  [id number] Gretta Cool PATEL Diagnosing Phys: Nelva Bush MD  Sonographer Comments: Suboptimal apical window and suboptimal subcostal window. IMPRESSIONS  1. Left ventricular ejection fraction, by estimation, is 45 to 50%. The left ventricle has mildly decreased function. The left ventricle demonstrates global hypokinesis. There is moderate left ventricular hypertrophy. Left ventricular diastolic parameters are consistent with Grade III diastolic dysfunction (restrictive). Elevated left atrial pressure.  2. Right ventricular systolic function is normal. The right ventricular size is normal.  3. Left atrial size was mildly dilated.  4. The mitral valve is degenerative. Mild mitral valve regurgitation. No evidence of mitral stenosis.  5. The aortic valve has an indeterminant number of cusps. There is mild calcification of the aortic valve. There is moderate thickening of the aortic valve. Aortic valve regurgitation is trivial. Mild to moderate aortic valve sclerosis/calcification is present, without any evidence of aortic stenosis. FINDINGS  Left Ventricle: Left ventricular ejection fraction, by estimation, is 45 to 50%. The left ventricle has mildly decreased function. The left ventricle demonstrates global hypokinesis. Definity contrast agent was given IV to delineate the left ventricular  endocardial borders. The left ventricular internal cavity size was normal in size. There is moderate left ventricular hypertrophy. Left ventricular diastolic parameters are consistent with Grade III diastolic dysfunction (restrictive). Elevated left  atrial pressure. Right Ventricle: The right ventricular size is normal. No increase in right ventricular wall thickness. Right ventricular systolic function is normal. Left Atrium: Left atrial size was mildly dilated. Right Atrium: Right atrial size was not well visualized. Pericardium: The pericardium was not well visualized. Mitral Valve: The mitral valve is degenerative in appearance. There is mild thickening of the mitral valve leaflet(s). There is mild calcification of the mitral valve leaflet(s). Mild mitral valve regurgitation. No evidence of mitral valve stenosis. MV peak gradient, 4.8 mmHg. The mean mitral valve gradient is 1.0 mmHg. Tricuspid Valve: The tricuspid valve is not well visualized. Tricuspid valve regurgitation is not demonstrated. Aortic Valve: The aortic valve has an indeterminant number of cusps. There is mild calcification of the aortic valve. There is moderate thickening of the aortic valve. There is mild aortic valve annular calcification. Aortic valve regurgitation is trivial. Mild to moderate aortic valve sclerosis/calcification is present, without any evidence of aortic stenosis. Aortic valve mean gradient measures 7.0 mmHg. Aortic valve peak gradient measures 11.7 mmHg. Aortic valve area, by VTI measures 2.78 cm. Pulmonic Valve: The pulmonic valve was not well visualized. Pulmonic valve regurgitation is not visualized. No evidence of pulmonic stenosis. Aorta: The aortic root is normal in size and structure. Pulmonary Artery: The pulmonary artery is of normal size. IAS/Shunts: The interatrial septum was not well visualized.  LEFT VENTRICLE PLAX 2D LVIDd:         5.10 cm      Diastology LVIDs:         3.93 cm      LV e' medial:   6.31 cm/s LV PW:         1.53 cm      LV E/e' medial: 17.0 LV IVS:        1.31 cm LVOT diam:     2.20 cm LV SV:         108 LV SV Index:   49 LVOT Area:     3.80 cm  LV Volumes (MOD) LV vol d, MOD A2C: 158.0 ml LV vol d, MOD A4C: 187.0 ml LV vol s,  MOD A2C:  94.2 ml LV vol s, MOD A4C: 129.0 ml LV SV MOD A2C:     63.8 ml LV SV MOD A4C:     187.0 ml LV SV MOD BP:      62.0 ml LEFT ATRIUM             Index LA diam:        4.10 cm 1.86 cm/m LA Vol (A2C):   67.8 ml 30.72 ml/m LA Vol (A4C):   67.2 ml 30.45 ml/m LA Biplane Vol: 70.9 ml 32.12 ml/m  AORTIC VALVE                     PULMONIC VALVE AV Area (Vmax):    2.87 cm      PV Vmax:       1.00 m/s AV Area (Vmean):   2.67 cm      PV Vmean:      68.300 cm/s AV Area (VTI):     2.78 cm      PV VTI:        0.203 m AV Vmax:           171.00 cm/s   PV Peak grad:  4.0 mmHg AV Vmean:          126.000 cm/s  PV Mean grad:  2.0 mmHg AV VTI:            0.388 m AV Peak Grad:      11.7 mmHg AV Mean Grad:      7.0 mmHg LVOT Vmax:         129.00 cm/s LVOT Vmean:        88.600 cm/s LVOT VTI:          0.284 m LVOT/AV VTI ratio: 0.73  AORTA Ao Root diam: 3.30 cm MITRAL VALVE MV Area (PHT): 3.77 cm     SHUNTS MV Area VTI:   3.77 cm     Systemic VTI:  0.28 m MV Peak grad:  4.8 mmHg     Systemic Diam: 2.20 cm MV Mean grad:  1.0 mmHg MV Vmax:       1.09 m/s MV Vmean:      54.1 cm/s MV Decel Time: 201 msec MV E velocity: 107.00 cm/s MV A velocity: 39.80 cm/s MV E/A ratio:  2.69 Christopher End MD Electronically signed by Nelva Bush MD Signature Date/Time: 10/31/2020/12:38:25 PM    Final         Scheduled Meds:  allopurinol  300 mg Oral Daily   aspirin EC  81 mg Oral Daily   atorvastatin  80 mg Oral Daily   carvedilol  3.125 mg Oral BID WC   furosemide  80 mg Intravenous Daily   gabapentin  1,200 mg Oral QHS   insulin aspart  0-15 Units Subcutaneous TID WC   losartan  25 mg Oral BID   potassium chloride  40 mEq Oral Daily   sodium chloride flush  3 mL Intravenous Q12H   sodium chloride flush  3 mL Intravenous Q12H   sodium chloride flush  3 mL Intravenous Q12H   Continuous Infusions:  sodium chloride     heparin 1,550 Units/hr (11/01/20 0347)     LOS: 2 days    Time spent: 33 mins     Wyvonnia Dusky,  MD [facility] Pager [phone]  If 7PM-7AM, please contact night-coverage 11/01/2020, 9:16 AM

## 2020-11-01 NOTE — ED Notes (Signed)
Informed RN bed assigned 

## 2020-11-01 NOTE — Progress Notes (Signed)
Inpatient Diabetes Program Recommendations  AACE/ADA: New Consensus Statement on Inpatient Glycemic Control (2015)  Target Ranges:  Prepandial:   less than 140 mg/dL      Peak postprandial:   less than 180 mg/dL (1-2 hours)      Critically ill patients:  140 - 180 mg/dL   Lab Results  Component Value Date   GLUCAP 270 (H) 11/01/2020   HGBA1C 7.0 (H) 10/30/2020    Review of Glycemic Control Results for Donald [name], Donald [name] (MRN [id number]) as of 11/01/2020 10:42  Ref. Range 10/31/2020 09:27 10/31/2020 10:55 10/31/2020 16:38 10/31/2020 21:59 11/01/2020 07:04  Glucose-Capillary Latest Ref Range: 70 - 99 mg/dL 201 (H) 202 (H) 256 (H) 245 (H) 270 (H)   Diabetes history: DM 2, Sees Dr. Renato Shin, Endocrinologist in [place] Diabetes medications: NPH 270 units qhs, Regular insulin 210 units breakfast, 150 units lunch, 290 units supper, 10 extra units after steroid injections Current orders for Inpatient glycemic control:  Novolog 0-15 units tid  Inpatient Diabetes Program Recommendations:    -  start Semglee 25 units (0.2 units/kg)  - Add Novolog hs scale  Thanks,  Tama Headings RN, MSN, BC-ADM Inpatient Diabetes Coordinator Team Pager [phone] (8a-5p)

## 2020-11-01 NOTE — Consult Note (Signed)
[place] for heparin Indication: atrial fibrillation  Allergies  Allergen Reactions   Codeine Other (See Comments)    REACTION: chest pain   Pioglitazone Other (See Comments)    REACTION to Actos: swelling in ankles    Patient Measurements: Height: 5\' 4"  (162.6 cm) Weight: 120.5 kg (265 lb 10.5 oz) IBW/kg (Calculated) : 59.2 Heparin Dosing Weight: 88kg  Vital Signs: BP: 179/70 (10/19 0130) Pulse Rate: 56 (10/19 0130)  Labs: Recent Labs    10/30/20 1413 10/31/20 0934 10/31/20 1058 10/31/20 1355 10/31/20 1651 11/01/20 0129  HGB 15.4  --   --   --  13.9  --   HCT 45.6  --   --   --  43.7  --   PLT 192  --   --   --  198  --   APTT  --   --   --   --  85*  --   LABPROT  --   --   --   --  14.5  --   INR  --   --   --   --  1.1  --   HEPARINUNFRC  --   --   --   --   --  0.26*  CREATININE 1.42*  --   --  1.48*  --   --   TROPONINIHS 132* 321* 288*  --   --   --      Estimated Creatinine Clearance: 53.4 mL/min (A) (by C-G formula based on SCr of 1.48 mg/dL (H)).   Medical History: Past Medical History:  Diagnosis Date   (HFpEF) heart failure with preserved ejection fraction ([place])    a. 09/2018 Echo: EF 60-65%. PASP 89mmHg. Mild-mod LAE. Mild MR/TR.   ANEMIA-IRON DEFICIENCY 07/26/2006   Anxiety    ASTHMA 07/26/2006   Asthma    Back pain, chronic    Bell's palsy    CKD (chronic kidney disease), stage III ([place])    COLONIC POLYPS, HX OF 07/26/2006   COVID-19 virus infection 01/2020   DEPRESSION 03/14/2009   DIABETES MELLITUS, TYPE II 07/26/2006   [place] DISEASE, LUMBAR 10/05/2007   DVT 12/03/2007   DYSLIPIDEMIA 04/13/2009   Dyspnea    when gets up and walks around and back is hurting really bad -only Shortness of breath  then   GERD 07/26/2006   Gout    Heart murmur    History of kidney stones    HYPERTENSION 07/26/2006   INSOMNIA 08/21/2007   Neuropathy    Nonobstructive CAD (coronary artery disease)    a. 2012 Cath: no high  grade stenosis; b. 2018 MV: No ischemia. Attenuation artifact.    OBSTRUCTIVE SLEEP APNEA 12/03/2007   Use C-PAP   PERIPHERAL NEUROPATHY 07/26/2006   Pernicious anemia 11/20/2006   PULMONARY EMBOLISM 10/05/2007    Medications:  Scheduled:   allopurinol  300 mg Oral Daily   aspirin EC  81 mg Oral Daily   atorvastatin  80 mg Oral Daily   carvedilol  3.125 mg Oral BID WC   furosemide  80 mg Intravenous Daily   gabapentin  1,200 mg Oral QHS   insulin aspart  0-15 Units Subcutaneous TID WC   losartan  25 mg Oral BID   potassium chloride  40 mEq Oral Daily   sodium chloride flush  3 mL Intravenous Q12H   sodium chloride flush  3 mL Intravenous Q12H    Assessment: 75yo M with PMH of HFpEF,  asthma, CKD, depression, DM, HLD, GERD, HTN, CAD, PE in 2009 who was admitted for syncope. Pt was found to be in afib and the pharmacy was consulted for heparin dosing.   Baseline CBC, PT/INR/aPTT ordered  No prior AC noted on PTA med list  Goal of Therapy:  Heparin level 0.3-0.7 units/ml Monitor platelets by anticoagulation protocol: Yes  10/19 0129 HL 0.26, subtherapeutic   Plan:  Give 1300 units bolus x 1 Increase heparin infusion to 1550 units/hr Recheck HL level in 8 hours after rate change Continue to monitor H&H and platelets with daily CBC while on heparin   Renda Rolls, PharmD, [facility] 11/01/2020 2:38 AM

## 2020-11-02 ENCOUNTER — Inpatient Hospital Stay (HOSPITAL_COMMUNITY)
Admit: 2020-11-02 | Discharge: 2020-11-02 | Disposition: A | Discharge status: Home / Self Care | Payer: Medicare Other | Attending: Physician Assistant | Admitting: Physician Assistant

## 2020-11-02 ENCOUNTER — Encounter: Payer: Self-pay | Admitting: Internal Medicine

## 2020-11-02 DIAGNOSIS — R55 Syncope and collapse: Secondary | ICD-10-CM

## 2020-11-02 DIAGNOSIS — I25118 Atherosclerotic heart disease of native coronary artery with other forms of angina pectoris: Secondary | ICD-10-CM

## 2020-11-02 DIAGNOSIS — I214 Non-ST elevation (NSTEMI) myocardial infarction: Principal | ICD-10-CM

## 2020-11-02 DIAGNOSIS — I420 Dilated cardiomyopathy: Secondary | ICD-10-CM

## 2020-11-02 DIAGNOSIS — I480 Paroxysmal atrial fibrillation: Secondary | ICD-10-CM

## 2020-11-02 LAB — HEPARIN LEVEL (UNFRACTIONATED)
Heparin Unfractionated: 0.23 IU/mL — ABNORMAL LOW (ref 0.30–0.70)
Heparin Unfractionated: 0.35 IU/mL (ref 0.30–0.70)

## 2020-11-02 LAB — CBC
HCT: 41.9 % (ref 39.0–52.0)
Hemoglobin: 13.8 g/dL (ref 13.0–17.0)
MCH: 28.7 pg (ref 26.0–34.0)
MCHC: 32.9 g/dL (ref 30.0–36.0)
MCV: 87.1 fL (ref 80.0–100.0)
Platelets: 195 K/uL (ref 150–400)
RBC: 4.81 MIL/uL (ref 4.22–5.81)
RDW: 14.3 % (ref 11.5–15.5)
WBC: 7.5 K/uL (ref 4.0–10.5)
nRBC: 0 % (ref 0.0–0.2)

## 2020-11-02 LAB — BASIC METABOLIC PANEL WITH GFR
Anion gap: 9 (ref 5–15)
BUN: 30 mg/dL — ABNORMAL HIGH (ref 8–23)
CO2: 27 mmol/L (ref 22–32)
Calcium: 8.7 mg/dL — ABNORMAL LOW (ref 8.9–10.3)
Chloride: 99 mmol/L (ref 98–111)
Creatinine, Ser: 1.59 mg/dL — ABNORMAL HIGH (ref 0.61–1.24)
GFR, Estimated: 46 mL/min — ABNORMAL LOW
Glucose, Bld: 290 mg/dL — ABNORMAL HIGH (ref 70–99)
Potassium: 4 mmol/L (ref 3.5–5.1)
Sodium: 135 mmol/L (ref 135–145)

## 2020-11-02 LAB — GLUCOSE, CAPILLARY
Glucose-Capillary: 274 mg/dL — ABNORMAL HIGH (ref 70–99)
Glucose-Capillary: 346 mg/dL — ABNORMAL HIGH (ref 70–99)

## 2020-11-02 MED ORDER — ISOSORBIDE MONONITRATE ER 30 MG PO TB24: 30.0000 mg | ORAL_TABLET | Freq: Every day | ORAL | 0 refills | Status: DC

## 2020-11-02 MED ORDER — ATORVASTATIN CALCIUM 80 MG PO TABS: 80.0000 mg | ORAL_TABLET | Freq: Every day | ORAL | 0 refills | Status: DC

## 2020-11-02 MED ORDER — APIXABAN 5 MG PO TABS: 5.0000 mg | ORAL_TABLET | Freq: Two times a day (BID) | ORAL | Status: DC

## 2020-11-02 MED ORDER — POTASSIUM CHLORIDE CRYS ER 20 MEQ PO TBCR: 40.0000 meq | EXTENDED_RELEASE_TABLET | Freq: Every day | ORAL | 0 refills | Status: DC

## 2020-11-02 MED ORDER — APIXABAN 5 MG PO TABS
5.0000 mg | ORAL_TABLET | Freq: Two times a day (BID) | ORAL | 0 refills | Status: DC
Start: 2020-11-02 — End: 2020-11-13

## 2020-11-02 MED ORDER — HEPARIN BOLUS VIA INFUSION
1300.0000 [IU] | Freq: Once | INTRAVENOUS | Status: AC
  Administered 2020-11-02: 1300 [IU] via INTRAVENOUS
  Filled 2020-11-02: qty 1300

## 2020-11-02 NOTE — Telephone Encounter (Signed)
LMOV to schedule follow up 

## 2020-11-02 NOTE — Progress Notes (Signed)
Progress Note  Patient Name: Donald [name] Date of Encounter: 11/02/2020  Primary Cardiologist: Ida Rogue, MD  Subjective   Feels well this AM. Ambulating w/o difficulty.  No significant events on tele but brady in the 40's this AM after ? blocker.  Inpatient Medications    Scheduled Meds:  allopurinol  300 mg Oral Daily   aspirin EC  81 mg Oral Daily   atorvastatin  80 mg Oral Daily   carvedilol  3.125 mg Oral BID WC   gabapentin  1,200 mg Oral QHS   insulin aspart  0-15 Units Subcutaneous TID WC   isosorbide mononitrate  30 mg Oral Daily   losartan  25 mg Oral BID   potassium chloride  40 mEq Oral Daily   sodium chloride flush  3 mL Intravenous Q12H   sodium chloride flush  3 mL Intravenous Q12H   sodium chloride flush  3 mL Intravenous Q12H   sodium chloride flush  3 mL Intravenous Q12H   Continuous Infusions:  sodium chloride     sodium chloride     heparin 1,800 Units/hr (11/02/20 0634)   PRN Meds: sodium chloride, sodium chloride, acetaminophen **OR** acetaminophen, colchicine, hydrALAZINE, HYDROcodone-acetaminophen, Naphazoline-Pheniramine, sodium chloride flush, sodium chloride flush   Vital Signs    Vitals:   11/02/20 0223 11/02/20 0446 11/02/20 0724 11/02/20 1129  BP: (!) 146/62 (!) 156/54 (!) 161/73 (!) 129/53  Pulse: (!) 57 (!) 52 (!) 59 (!) 55  Resp: 17  18 18   Temp: 98.6 F (37 C) 97.9 F (36.6 C) 98.3 F (36.8 C) 98.5 F (36.9 C)  TempSrc:  Oral  Oral  SpO2: 94% 94% 96% 95%  Weight: 116.4 kg     Height:        Intake/Output Summary (Last 24 hours) at 11/02/2020 1323 Last data filed at 11/02/2020 1132 Gross per 24 hour  Intake 480 ml  Output 300 ml  Net 180 ml   Filed Weights   11/01/20 1305 11/01/20 1558 11/02/20 0223  Weight: 120.5 kg 116.8 kg 116.4 kg    Physical Exam   GEN: Obese, in no acute distress.  HEENT: Grossly normal.  Neck: Obese, difficult to gauge JVP.  No carotid bruits, or masses. Cardiac: RRR, no  murmurs, rubs, or gallops. No clubbing, cyanosis, edema.  Radials 2+, DP/PT 2+ and equal bilaterally. R radial cath site w/o bleeding/bruits/hematoma.  Respiratory:  Respirations regular and unlabored, clear to auscultation bilaterally. GI: Obese, soft, nontender, nondistended, BS + x 4. MS: no deformity or atrophy. Skin: warm and dry, no rash. Neuro:  Strength and sensation are intact. Psych: AAOx3.  Normal affect.  Labs    Chemistry Recent Labs  Lab 10/31/20 1355 11/01/20 0857 11/02/20 0251  NA 138 138 135  K 4.2 3.9 4.0  CL 102 98 99  CO2 29 30 27   GLUCOSE 286* 262* 290*  BUN 24* 27* 30*  CREATININE 1.48* 1.55* 1.59*  CALCIUM 8.5* 9.2 8.7*  PROT 5.6*  --   --   ALBUMIN 3.1*  --   --   AST 23  --   --   ALT 15  --   --   ALKPHOS 72  --   --   BILITOT 1.7*  --   --   GFRNONAA 50* 47* 46*  ANIONGAP 7 10 9      Hematology Recent Labs  Lab 10/31/20 1651 11/01/20 0702 11/02/20 0251  WBC 6.5 6.2 7.5  RBC 4.94 4.82 4.81  HGB  13.9 13.6 13.8  HCT 43.7 43.3 41.9  MCV 88.5 89.8 87.1  MCH 28.1 28.2 28.7  MCHC 31.8 31.4 32.9  RDW 14.5 14.2 14.3  PLT 198 177 195    Cardiac Enzymes  Recent Labs  Lab 10/30/20 1413 10/31/20 0934 10/31/20 1058  TROPONINIHS 132* 321* 288*      DDimer  Recent Labs  Lab 10/30/20 1413  DDIMER 0.96*     Lipids  Lab Results  Component Value Date   CHOL 119 10/31/2020   HDL 25 (L) 10/31/2020   LDLCALC 51 10/31/2020   LDLDIRECT 52.2 05/25/2009   TRIG 214 (H) 10/31/2020   CHOLHDL 4.8 10/31/2020    HbA1c  Lab Results  Component Value Date   HGBA1C 7.0 (H) 10/30/2020    Radiology    DG Chest 1 View  Result Date: 10/31/2020 CLINICAL DATA:  History of asthma, heart failure presents after fall EXAM: CHEST  1 VIEW COMPARISON:  Chest radiograph 08/12/2018 FINDINGS: The cardiomediastinal silhouette is normal. There is no focal consolidation or pulmonary edema. There is no pleural effusion or pneumothorax. There is no acute  osseous abnormality. IMPRESSION: No radiographic evidence of acute cardiopulmonary process. Electronically Signed   By: Valetta Mole M.D.   On: 10/31/2020 11:10   CT HEAD WO CONTRAST (5MM)  Result Date: 10/30/2020 CLINICAL DATA:  Head trauma, minor (Age >= 65y).  Unwitnessed fall. EXAM: CT HEAD WITHOUT CONTRAST TECHNIQUE: Contiguous axial images were obtained from the base of the skull through the vertex without intravenous contrast. COMPARISON:  None. FINDINGS: Brain: No acute intracranial abnormality. Specifically, no hemorrhage, hydrocephalus, mass lesion, acute infarction, or significant intracranial injury. Vascular: No hyperdense vessel or unexpected calcification. Skull: No acute calvarial abnormality. Sinuses/Orbits: No acute findings Other: None IMPRESSION: No acute intracranial abnormality. Electronically Signed   By: Rolm Baptise M.D.   On: 10/30/2020 17:56   MR BRAIN WO CONTRAST  Result Date: 10/30/2020 CLINICAL DATA:  Syncopal episode. EXAM: MRI HEAD WITHOUT CONTRAST TECHNIQUE: Multiplanar, multiecho pulse sequences of the brain and surrounding structures were obtained without intravenous contrast. COMPARISON:  Head CT earlier same day. FINDINGS: Brain: Diffusion imaging does not show any acute or subacute infarction. No focal abnormality affects the brainstem or cerebellum. Cerebral hemispheres show mild age related volume loss with minimal small vessel change of the white matter, less than often seen at this age. No cortical or large vessel territory infarction. No mass lesion, hemorrhage, hydrocephalus or extra-axial collection. Vascular: Major vessels at the base of the brain show flow. Skull and upper cervical spine: Negative Sinuses/Orbits: Paranasal sinuses are clear. Orbits are negative. Previous lens implant on the left. Other: Left mastoid effusion. IMPRESSION: No acute intracranial finding. Mild age related volume loss and minimal small vessel change of the hemispheric white matter.  Left mastoid effusion. Electronically Signed   By: Nelson Chimes M.D.   On: 10/30/2020 20:39   NM Pulmonary Perfusion  Result Date: 10/31/2020 CLINICAL DATA:  PE suspected EXAM: NUCLEAR MEDICINE PERFUSION LUNG SCAN TECHNIQUE: Perfusion images were obtained in multiple projections after intravenous injection of radiopharmaceutical. Ventilation scans intentionally deferred if perfusion scan and chest x-ray adequate for interpretation during COVID 19 epidemic. RADIOPHARMACEUTICALS:  4.44 mCi Tc-74m MAA IV COMPARISON:  Same-day chest radiographs FINDINGS: Normal, homogeneous perfusion of the lungs. No suspicious filling defects. IMPRESSION: Very low probability examination for pulmonary embolism by modified perfusion only PIOPED criteria (PE absent). Electronically Signed   By: Delanna Ahmadi M.D.   On: 10/31/2020 11:03  US Carotid Bilateral  Result Date: 10/31/2020 CLINICAL DATA:  Syncope and collapse EXAM: BILATERAL CAROTID DUPLEX ULTRASOUND TECHNIQUE: Pearline Cables scale imaging, color Doppler and duplex ultrasound were performed of bilateral carotid and vertebral arteries in the neck. COMPARISON:  None. FINDINGS: Criteria: Quantification of carotid stenosis is based on velocity parameters that correlate the residual internal carotid diameter with NASCET-based stenosis levels, using the diameter of the distal internal carotid lumen as the denominator for stenosis measurement. The following velocity measurements were obtained: RIGHT ICA: 68 cm/sec CCA: 85 cm/sec SYSTOLIC ICA/CCA RATIO:  0.9 ECA: 170 cm/sec LEFT ICA: 75 cm/sec CCA: 82 cm/sec SYSTOLIC ICA/CCA RATIO:  1.1 ECA: 136 cm/sec RIGHT CAROTID ARTERY: Antegrade RIGHT VERTEBRAL ARTERY:  Antegrade LEFT CAROTID ARTERY:  Antegrade LEFT VERTEBRAL ARTERY:  Antegrade Upper extremity blood pressures: Not provided Mild plaque in the distal common carotid arteries bilaterally, as well as at the bifurcation. IMPRESSION: Velocities consistent with less than 50% stenosis in  the bilateral carotid arteries. Electronically Signed   By: Merilyn Baba M.D.   On: 10/31/2020 01:49   US Venous Img Lower Bilateral (DVT)  Addendum Date: 10/31/2020   ADDENDUM REPORT: 10/31/2020 18:45 ADDENDUM: Voice recognition error in the original findings. Since this was a bilateral study, there was no contralateral femoral vein. Electronically Signed   By: Rolm Baptise M.D.   On: 10/31/2020 18:45   Result Date: 10/31/2020 CLINICAL DATA:  Bilateral lower extremity swelling EXAM: BILATERAL LOWER EXTREMITY VENOUS DOPPLER ULTRASOUND TECHNIQUE: Gray-scale sonography with compression, as well as color and duplex ultrasound, were performed to evaluate the deep venous system(s) from the level of the common femoral vein through the popliteal and proximal calf veins. COMPARISON:  None. FINDINGS: VENOUS Normal compressibility of the common femoral, superficial femoral, and popliteal veins, as well as the visualized calf veins. Visualized portions of profunda femoral vein and great saphenous vein unremarkable. No filling defects to suggest DVT on grayscale or color Doppler imaging. Doppler waveforms show normal direction of venous flow, normal respiratory plasticity and response to augmentation. Limited views of the contralateral common femoral vein are unremarkable. OTHER None. Limitations: none IMPRESSION: Negative. Electronically Signed: By: Rolm Baptise M.D. On: 10/31/2020 18:41   Telemetry    RSR/Sinus brady - rare AV block in setting of sleep (solitary dropped beats noted w/ brief pauses) - Personally Reviewed  Cardiac Studies   2D Echocardiogram 10.18.2022   1. Left ventricular ejection fraction, by estimation, is 45 to 50%. The  left ventricle has mildly decreased function. The left ventricle  demonstrates global hypokinesis. There is moderate left ventricular  hypertrophy. Left ventricular diastolic  parameters are consistent with Grade III diastolic dysfunction  (restrictive). Elevated  left atrial pressure.   2. Right ventricular systolic function is normal. The right ventricular  size is normal.   3. Left atrial size was mildly dilated.   4. The mitral valve is degenerative. Mild mitral valve regurgitation. No  evidence of mitral stenosis.   5. The aortic valve has an indeterminant number of cusps. There is mild  calcification of the aortic valve. There is moderate thickening of the  aortic valve. Aortic valve regurgitation is trivial. Mild to moderate  aortic valve sclerosis/calcification is  present, without any evidence of aortic stenosis.  _____________   Cardiac Catheterization  10.19.2022  Diagnostic Dominance: Right  _____________   Patient Profile     75 y.o. male with hx of HFpEF (EF 60%-65% 45-50%), nonobstructive coronary artery disease (cath 2012 with no critical high grade  obstructive dz, elevated LVEDP), Afib (newly dx 10/18), poorly controlled and insulin dependent diabetes, hyperlipidemia, PAD, 2009 DVT and extensive bilateral pulmonary embolism, CKD 3, OSA on CPAP, asthmatic bronchitis, morbid obesity, gout, GERD, anemia, history of hematuria, s/p parathyroidectomy (right sided, 2018), hypercalcemia, chronic back pain s/p surgeries, and who was admitted 10/30/2020 following syncopal episode.  Found to be in afib of unknown duration.  Echo w/ new LV dysfxn (45-50%).  Cath w/ minimal dzs 10/19.  Assessment & Plan    1. Syncope:  Unclear etiology at this point. Some AV block noted during periods of sleeps (uses CPAP @ home), but no high grade block, prolonged pauses.  He has been brady since starting coreg, and I will d/c.  Zio placed yesterday  w/ plan for 14 days of live tele monitoring.   Will plan outpt f/u in 3-4 wks, once monitor has been processed.  2.  PAF:  presented in afib but has been maintaining sinus on oral ? blocker therapy.  Unfortunately, he has also been brady in the 40's since starting carvedilol.  I will d/c. CHA2DS2VASc = 5.  Will  transition to eliquis today.  3.  Demand ischemia/nonobs CAD:  HsTrop up to 321.  Echo w/ EF 45-50%. Cath w/ minimal nonobs CAD.  Cont med rx including asa, statin and nitrate.  4.  Essential HTN:  BPs variable - 129/53 this AM.   Cont current meds - ARB, nitrate.  May require titration of ARB as outpt given d/c'ing ? blocker.  5.  HL:  LDL 51.  Cont statin.  6.  DMII:  A1c 7.0.  Insulin per IM.  7.  CKD III:  relatively stable.  8.  H/o PE/DVT:  no findings of such this admission.  Reid to start in setting of PAF.  9.  OSA:  Uses CPAP @ home.  10.  Hypokalemia:  resolved.  Cont KCl 40 meq daily.  11.  HFpEF:  volume stable.  Plan to resume home dose of lasix @ dischage.  We will f/u a bmet @ office f/u.  Signed, Murray Hodgkins, NP  11/02/2020, 1:23 PM    For questions or updates, please contact   Please consult [email or web address] for contact info under Cardiology/STEMI.

## 2020-11-02 NOTE — Evaluation (Signed)
Physical Therapy Evaluation Patient Details Name: Donald [name] MRN: [id number] DOB: [birth year] Today's Date: 11/02/2020  History of Present Illness  Donald [name] is a 75 y.o. male seen in ed with complaints of altered mental status.  Patient was on his way to go to his physical therapy appointment.  He was found unconscious on the garage floor  and  does not remember any presyncopal or prodromal or any feelings of dizziness tinnitus blurred vision or chest pain palpitations.  Patient woke up on the floor and does not remember what happened.  Wife probably was on the floor for about an hour and when EMS was called and blood sugar at that time was 68, pt called his wife on the phone and then she called 911, pt said he was awake on floor for about an hour trying to get up.still no symptoms except weakness,he could move his extremities but did not have strength to get up of floor.   Patient denies any headache blurred vision speech or gait issues chest pain palpitations shortness of breath fevers chills flank pain abdominal pain dysuria or bowel bladder complaints.     Pt has past medical history of asthma, CKD,CHF, Anxiety, DM II, DVT, Covid-19, Htn, Neuropathy, OSA. Cardiologist is Dr.Golan.Nephrologist  -[place], urologist [facility]. Patient is s/p cardica cath.   Clinical Impression  Patient received in bed, reports he is hurting in the bed. Patient is mod independent with supine to sit. He requires min assist for sit to stand and ambulated with SPC 50 feet in room min guard/supervision. Patient appears to be close to baseline with mobility and wants to return home. He will continue to benefit from skilled PT while here to improve strength and mobility.          Recommendations for follow up therapy are one component of a multi-disciplinary discharge planning process, led by the attending physician.  Recommendations may be updated based on patient status, additional functional  criteria and insurance authorization.  Follow Up Recommendations No PT follow up    Equipment Recommendations  None recommended by PT    Recommendations for Other Services       Precautions / Restrictions Precautions Precaution Comments: low fall Restrictions Weight Bearing Restrictions: No      Mobility  Bed Mobility Overal bed mobility: Modified Independent             General bed mobility comments: increased effort, but no physical assist.    Transfers Overall transfer level: Needs assistance Equipment used: Straight cane Transfers: Sit to/from Stand Sit to Stand: Min assist         General transfer comment: increased effort.  Ambulation/Gait Ambulation/Gait assistance: Min guard;Supervision Gait Distance (Feet): 50 Feet Assistive device: Straight cane Gait Pattern/deviations: Step-through pattern Gait velocity: WNL   General Gait Details: patient is generally safe with ambulation using SPC. VSS  Stairs            Wheelchair Mobility    Modified Rankin (Stroke Patients Only)       Balance Overall balance assessment: Modified Independent                                           Pertinent Vitals/Pain Pain Assessment: Faces Faces Pain Scale: Hurts little more Pain Location: back and leg pain- chronic Pain Descriptors / Indicators: Discomfort Pain Intervention(s): Monitored during session;Repositioned  Home Living Family/patient expects to be discharged to:: Private residence Living Arrangements: Spouse/significant other Available Help at Discharge: Family;Available 24 hours/day Type of Home: House Home Access: Stairs to enter   [employer] of Steps: 1 Home Layout: One level Home Equipment: Cane - single point      Prior Function Level of Independence: Independent with assistive device(s)               Hand Dominance        Extremity/Trunk Assessment   Upper Extremity  Assessment Upper Extremity Assessment: Overall WFL for tasks assessed    Lower Extremity Assessment Lower Extremity Assessment: Overall WFL for tasks assessed    Cervical / Trunk Assessment Cervical / Trunk Assessment: Normal  Communication   Communication: No difficulties  Cognition Arousal/Alertness: Awake/alert Behavior During Therapy: WFL for tasks assessed/performed Overall Cognitive Status: Within Functional Limits for tasks assessed                                        General Comments      Exercises     Assessment/Plan    PT Assessment Patient needs continued PT services  PT Problem List Decreased mobility       PT Treatment Interventions Therapeutic activities;Gait training;Therapeutic exercise;Functional mobility training;Patient/family education    PT Goals (Current goals can be found in the Care Plan section)  Acute Rehab PT Goals Patient Stated Goal: to return home today PT Goal Formulation: With patient Time For Goal Achievement: 11/09/20 Potential to Achieve Goals: Good    Frequency Min 2X/week   Barriers to discharge        Co-evaluation               AM-PAC PT "6 Clicks" Mobility  Outcome Measure Help needed turning from your back to your side while in a flat bed without using bedrails?: None Help needed moving from lying on your back to sitting on the side of a flat bed without using bedrails?: None Help needed moving to and from a bed to a chair (including a wheelchair)?: A Little Help needed standing up from a chair using your arms (e.g., wheelchair or bedside chair)?: A Little Help needed to walk in hospital room?: A Little Help needed climbing 3-5 steps with a railing? : A Little 6 Click Score: 20    End of Session   Activity Tolerance: Patient tolerated treatment well Patient left: in chair;with call bell/phone within reach Nurse Communication: Mobility status PT Visit Diagnosis: Pain;Muscle weakness  (generalized) (M62.81) Pain - Right/Left:  (B legs/back) Pain - part of body: Leg    Time: 0160-1093 PT Time Calculation (min) (ACUTE ONLY): 20 min   Charges:   PT Evaluation $PT Eval Moderate Complexity: 1 Mod PT Treatments $Gait Training: 8-22 mins        Donald [name], PT, GCS 11/02/20,12:39 PM

## 2020-11-02 NOTE — Consult Note (Signed)
[place] for heparin Indication: atrial fibrillation  Allergies  Allergen Reactions   Codeine Other (See Comments)    REACTION: chest pain   Pioglitazone Other (See Comments)    REACTION to Actos: swelling in ankles    Patient Measurements: Height: 5\' 4"  (162.6 cm) Weight: 116.4 kg (256 lb 9.9 oz) IBW/kg (Calculated) : 59.2 Heparin Dosing Weight: 88kg  Vital Signs: Temp: 98.6 F (37 C) (10/20 0223) Temp Source: Axillary (10/19 1558) BP: 146/62 (10/20 0223) Pulse Rate: 57 (10/20 0223)  Labs: Recent Labs    10/30/20 1413 10/31/20 0934 10/31/20 1058 10/31/20 1355 10/31/20 1651 11/01/20 0129 11/01/20 0702 11/01/20 0857 11/01/20 1206 11/02/20 0251  HGB 15.4  --   --   --  13.9  --  13.6  --   --  13.8  HCT 45.6  --   --   --  43.7  --  43.3  --   --  41.9  PLT 192  --   --   --  198  --  177  --   --  195  APTT  --   --   --   --  85*  --   --   --   --   --   LABPROT  --   --   --   --  14.5  --   --   --   --   --   INR  --   --   --   --  1.1  --   --   --   --   --   HEPARINUNFRC  --   --   --   --   --  0.26*  --   --  0.31 0.23*  CREATININE 1.42*  --   --  1.48*  --   --   --  1.55*  --  1.59*  TROPONINIHS 132* 321* 288*  --   --   --   --   --   --   --      Estimated Creatinine Clearance: 48.8 mL/min (A) (by C-G formula based on SCr of 1.59 mg/dL (H)).   Medical History: Past Medical History:  Diagnosis Date   (HFpEF) heart failure with preserved ejection fraction ([place])    a. 09/2018 Echo: EF 60-65%. PASP 73mmHg. Mild-mod LAE. Mild MR/TR.   ANEMIA-IRON DEFICIENCY 07/26/2006   Anxiety    ASTHMA 07/26/2006   Asthma    Back pain, chronic    Bell's palsy    CKD (chronic kidney disease), stage III ([place])    COLONIC POLYPS, HX OF 07/26/2006   COVID-19 virus infection 01/2020   DEPRESSION 03/14/2009   DIABETES MELLITUS, TYPE II 07/26/2006   [place] DISEASE, LUMBAR 10/05/2007   DVT 12/03/2007   DYSLIPIDEMIA 04/13/2009    Dyspnea    when gets up and walks around and back is hurting really bad -only Shortness of breath  then   GERD 07/26/2006   Gout    Heart murmur    History of kidney stones    HYPERTENSION 07/26/2006   INSOMNIA 08/21/2007   Neuropathy    Nonobstructive CAD (coronary artery disease)    a. 2012 Cath: no high grade stenosis; b. 2018 MV: No ischemia. Attenuation artifact.    OBSTRUCTIVE SLEEP APNEA 12/03/2007   Use C-PAP   PERIPHERAL NEUROPATHY 07/26/2006   Pernicious anemia 11/20/2006   PULMONARY EMBOLISM 10/05/2007    Medications:  Scheduled:   allopurinol  300 mg Oral Daily   aspirin EC  81 mg Oral Daily   atorvastatin  80 mg Oral Daily   carvedilol  3.125 mg Oral BID WC   gabapentin  1,200 mg Oral QHS   insulin aspart  0-15 Units Subcutaneous TID WC   isosorbide mononitrate  30 mg Oral Daily   losartan  25 mg Oral BID   potassium chloride  40 mEq Oral Daily   sodium chloride flush  3 mL Intravenous Q12H   sodium chloride flush  3 mL Intravenous Q12H   sodium chloride flush  3 mL Intravenous Q12H   sodium chloride flush  3 mL Intravenous Q12H    Assessment: 75yo M with PMH of HFpEF, asthma, CKD, depression, DM, HLD, GERD, HTN, CAD, PE in 2009 who was admitted for syncope. Pt was found to be in afib and the pharmacy was consulted for heparin dosing.   Baseline CBC, PT/INR/aPTT ordered  No prior AC noted on PTA med list  Date/time  HL Interpretation/comment 10/19 0129 0.26 Subthera at 1350 units/hr 10/19 1206 0.31 Thera x1, at 1550 >1650 units/hr --- 10/19 1600 --- TR-band removed 10/19 1800 -- Resume 1650 un/hr 10/20 0251 0.23 Subtherapeutic  Goal of Therapy:  Heparin level 0.3-0.7 units/ml Monitor platelets by anticoagulation protocol: Yes   Plan:  Bolus 1300 units x 1 Increase heparin infusion to 1800 un/h. Recheck HL level in 8 hours after rate change    Continue to monitor H&H and platelets with daily CBC while on heparin   Renda Rolls, PharmD,  [facility] 11/02/2020 3:55 AM

## 2020-11-02 NOTE — Discharge Summary (Addendum)
Physician Discharge Summary  Donald [name] SNK:[id number] DOB: [birth year] DOA: 10/30/2020  PCP: Ria Bush, MD  Admit date: 10/30/2020 Discharge date: 11/02/2020  Admitted From: home  Disposition:  home   Recommendations for Outpatient Follow-up:  Follow up with PCP in 1-2 weeks F/u w/ cardio, Dr. Rockey Situ, in 1-2 weeks  Will need a BMP w/in 1 week to check Cr   Home Health: Equipment/Devices:  Discharge Condition: stable  CODE STATUS: full  Diet recommendation: Heart Healthy / Carb Modified  Brief/Interim Summary: HPI was taken from Dr. Florina Ou: Donald [name] is a 75 y.o. male seen in ed with complaints of altered mental status.  Patient was on his way to go to his physical therapy appointment.  He was found unconscious on the garage floor  and  does not remember any presyncopal or prodromal or any feelings of dizziness tinnitus blurred vision or chest pain palpitations.  Patient woke up on the floor and does not remember what happened.  Wife probably was on the floor for about an hour and when EMS was called and blood sugar at that time was 68, pt called his wife on the phone and then she called 911, pt said he was awake on floor for about an hour trying to get up.still no symptoms except weakness,he could move his extremities but did not have strength to get up of floor.  Patient denies any headache blurred vision speech or gait issues chest pain palpitations shortness of breath fevers chills flank pain abdominal pain dysuria or bowel bladder complaints.   Pt has past medical history of asthma, CKD,CHF, Anxiety, DM II, DVT, Covid-19, Htn, Neuropathy, OSA. Cardiologist is Dr.Golan.Nephrologist  -[place], urologist in [facility] course from Dr. Jimmye Norman 10/19-10/20/22: Pt presented after a syncopal episode of unknown etiology. Syncopal work-up was neg which included echo, MRI, carotid US. Pt was d/c home w/ zio patch as per cardio. Of note, pt was  found to have NSTEMI as per cardio and cardiac cath was done which showed mild nonobstructive CAD w/ mild-mod AS. Pt will f/u w/ cardio, Dr. Rockey Situ, in 1-2 weeks and pt verbalized his understanding. For more information, please see previous progress/consult notes.   Discharge Diagnoses:  Principal Problem:   Syncope and collapse Active Problems:   Iron deficiency anemia   Essential hypertension   History of pulmonary embolus (PE)   GERD   OSA (obstructive sleep apnea)   CAD (coronary artery disease)   CKD stage 3 due to type 2 diabetes mellitus (HCC)   Type 2 diabetes mellitus with diabetic chronic kidney disease (HCC)   Non-ST elevation (NSTEMI) myocardial infarction ([place])   Cardiomyopathy ([place])      Syncope: etiology unclear. Echo showed EF of 45 to 50% with global hypokinesis, grade III diastolic dysfunction. Carotid ultrasound less than 50% stenosis bilaterally. MRI with no acute intracranial findings. Zio patch placed as per cardio    Acute on chronic combined CHF: continue on lasix. EF 93-79%,KWIOX III diastolic dysfunction, AS. S/p cardiac cath which showed mild nonobstructive CAD, mild-mod AS as per cardio. Continue on losartan, imdur. Monitor I/Os   A. fib: new onset. Possibly PAF. Started on eliquis as per cardio. Coreg was d/c as per cardio    NSTEMI: as per cardio. S/p cardiac cath which showed mild nonobstructive CAD, mild-mod AS as per cardio. Continue on losartan, imdur, carvedilol.   CKDIIIa: Cr is trending up slightly from day prior.    IDA: resolved  HTN: continue on losartan, imdur & cardura    History of PE: in 2009. VQ scan showed low probability of PE. [name] of b/l LE neg for DVT    OSA: does not use CPAP   CAD: continue on aspirin, statin   DM2: likely poorly controlled. Continue on SSI w/ accuchecks.   Morbid obesity: BMI 45.6. Complicates overall care & prognosis   Discharge Instructions  Discharge Instructions     AMB Referral to Cardiac  Rehabilitation - Phase II   Complete by: As directed    Diagnosis: NSTEMI   After initial evaluation and assessments completed: Virtual Based Care may be provided alone or in conjunction with Phase 2 Cardiac Rehab based on patient barriers.: Yes   Diet - low sodium heart healthy   Complete by: As directed    Diet Carb Modified   Complete by: As directed    Discharge instructions   Complete by: As directed    F/u w/ PCP in 1-2 weeks. F/u w/ cardio, Dr. Rockey Situ, in 3 weeks. No driving until cleared by cardiologist and/or PCP   Increase activity slowly   Complete by: As directed       Allergies as of 11/02/2020       Reactions   Codeine Other (See Comments)   REACTION: chest pain   Pioglitazone Other (See Comments)   REACTION to Actos: swelling in ankles        Medication List     STOP taking these medications    lovastatin 40 MG tablet Commonly known as: MEVACOR       TAKE these medications    albuterol 108 (90 Base) MCG/ACT inhaler Commonly known as: VENTOLIN HFA Inhale 2 puffs into the lungs every 6 (six) hours as needed for wheezing or shortness of breath.   allopurinol 300 MG tablet Commonly known as: ZYLOPRIM Take 1 tablet (300 mg total) by mouth daily.   ALPRAZolam 1 MG tablet Commonly known as: XANAX TAKE 1 TABLET BY MOUTH AT BEDTIME IF NEEDED FOR SLEEP   apixaban 5 MG Tabs tablet Commonly known as: ELIQUIS Take 1 tablet (5 mg total) by mouth 2 (two) times daily.   aspirin EC 81 MG tablet Take 1 tablet (81 mg total) by mouth daily.   atorvastatin 80 MG tablet Commonly known as: LIPITOR Take 1 tablet (80 mg total) by mouth daily. Start taking on: November 03, 2020   colchicine 0.6 MG tablet Commonly known as: Colcrys Take 1 tablet (0.6 mg total) by mouth daily as needed (gout flare). On first day of gout flare may take 2 tablets at once.   cyanocobalamin 1000 MCG/ML injection Commonly known as: (VITAMIN B-12) Inject 1 mL (1,000 mcg total) into  the muscle every 30 (thirty) days.   cyclobenzaprine 10 MG tablet Commonly known as: FLEXERIL Take 10 mg by mouth 3 (three) times daily as needed for muscle spasms.   doxazosin 1 MG tablet Commonly known as: Cardura Take 1 tablet (1 mg total) by mouth 2 (two) times daily.   fluticasone 50 MCG/ACT nasal spray Commonly known as: FLONASE Place 2 sprays into both nostrils daily.   furosemide 80 MG tablet Commonly known as: LASIX TAKE ONE TABLET (80 MG) BY MOUTH EVERY DAY take an extra lasix for swelling 1-2 a week   gabapentin 600 MG tablet Commonly known as: NEURONTIN TAKE TWO TABLETS AT BEDTIME   GE100 Blood Glucose Test test strip Generic drug: glucose blood 1 each by Other route 4 (four) times  daily. E11.9   HYDROcodone-acetaminophen 10-325 MG tablet Commonly known as: NORCO Take 1 tablet by mouth every 6 (six) hours as needed for moderate pain ((typically once to twice daily if needed)).   insulin NPH Human 100 UNIT/ML injection Commonly known as: HumuLIN N Inject 2.7 mLs (270 Units total) into the skin at bedtime.   insulin regular 100 units/mL injection Commonly known as: NovoLIN R ReliOn 3 times a day (just before each meal), 210-150-290 units, and syringes 4/day.   isosorbide mononitrate 30 MG 24 hr tablet Commonly known as: IMDUR Take 1 tablet (30 mg total) by mouth daily. Start taking on: November 03, 2020   losartan 25 MG tablet Commonly known as: COZAAR Take 1 tablet (25 mg total) by mouth at bedtime.   ondansetron 4 MG tablet Commonly known as: ZOFRAN Take 1 tablet (4 mg total) by mouth every 4 (four) hours as needed for nausea.   OPCON-A OP Place 1 drop into both eyes 3 (three) times daily as needed (itchy eyes.).   potassium chloride SA 20 MEQ tablet Commonly known as: KLOR-CON Take 2 tablets (40 mEq total) by mouth daily.   STOOL SOFTENER & LAXATIVE PO Take 2 tablets by mouth at bedtime.   SYRINGE 3CC/22GX1-1/2" 22G X 1-1/2" 3 ML Misc Use to  administer monthly b12 shots   Vitamin D3 25 MCG (1000 UT) Caps Take 1 capsule (1,000 Units total) by mouth daily.        Allergies  Allergen Reactions   Codeine Other (See Comments)    REACTION: chest pain   Pioglitazone Other (See Comments)    REACTION to Actos: swelling in ankles    Consultations: Cardio    Procedures/Studies: DG Chest 1 View  Result Date: 10/31/2020 CLINICAL DATA:  History of asthma, heart failure presents after fall EXAM: CHEST  1 VIEW COMPARISON:  Chest radiograph 08/12/2018 FINDINGS: The cardiomediastinal silhouette is normal. There is no focal consolidation or pulmonary edema. There is no pleural effusion or pneumothorax. There is no acute osseous abnormality. IMPRESSION: No radiographic evidence of acute cardiopulmonary process. Electronically Signed   By: Valetta Mole M.D.   On: 10/31/2020 11:10   CT HEAD WO CONTRAST (5MM)  Result Date: 10/30/2020 CLINICAL DATA:  Head trauma, minor (Age >= 65y).  Unwitnessed fall. EXAM: CT HEAD WITHOUT CONTRAST TECHNIQUE: Contiguous axial images were obtained from the base of the skull through the vertex without intravenous contrast. COMPARISON:  None. FINDINGS: Brain: No acute intracranial abnormality. Specifically, no hemorrhage, hydrocephalus, mass lesion, acute infarction, or significant intracranial injury. Vascular: No hyperdense vessel or unexpected calcification. Skull: No acute calvarial abnormality. Sinuses/Orbits: No acute findings Other: None IMPRESSION: No acute intracranial abnormality. Electronically Signed   By: Rolm Baptise M.D.   On: 10/30/2020 17:56   MR BRAIN WO CONTRAST  Result Date: 10/30/2020 CLINICAL DATA:  Syncopal episode. EXAM: MRI HEAD WITHOUT CONTRAST TECHNIQUE: Multiplanar, multiecho pulse sequences of the brain and surrounding structures were obtained without intravenous contrast. COMPARISON:  Head CT earlier same day. FINDINGS: Brain: Diffusion imaging does not show any acute or subacute  infarction. No focal abnormality affects the brainstem or cerebellum. Cerebral hemispheres show mild age related volume loss with minimal small vessel change of the white matter, less than often seen at this age. No cortical or large vessel territory infarction. No mass lesion, hemorrhage, hydrocephalus or extra-axial collection. Vascular: Major vessels at the base of the brain show flow. Skull and upper cervical spine: Negative Sinuses/Orbits: Paranasal sinuses are clear. Orbits  are negative. Previous lens implant on the left. Other: Left mastoid effusion. IMPRESSION: No acute intracranial finding. Mild age related volume loss and minimal small vessel change of the hemispheric white matter. Left mastoid effusion. Electronically Signed   By: Nelson Chimes M.D.   On: 10/30/2020 20:39   NM Pulmonary Perfusion  Result Date: 10/31/2020 CLINICAL DATA:  PE suspected EXAM: NUCLEAR MEDICINE PERFUSION LUNG SCAN TECHNIQUE: Perfusion images were obtained in multiple projections after intravenous injection of radiopharmaceutical. Ventilation scans intentionally deferred if perfusion scan and chest x-ray adequate for interpretation during COVID 19 epidemic. RADIOPHARMACEUTICALS:  4.44 mCi Tc-78m MAA IV COMPARISON:  Same-day chest radiographs FINDINGS: Normal, homogeneous perfusion of the lungs. No suspicious filling defects. IMPRESSION: Very low probability examination for pulmonary embolism by modified perfusion only PIOPED criteria (PE absent). Electronically Signed   By: Delanna Ahmadi M.D.   On: 10/31/2020 11:03   CARDIAC CATHETERIZATION  Result Date: 11/01/2020 Conclusions: Mild, non-obstructive coronary artery disease, as detailed below.  No significant stenosis identified to explain syncope or cardiomyopathy. Normal left ventricular filling pressure. Mild-moderate aortic valve stenosis based on peak-to-peak gradient (~20 mmHg). Recommendations: Optimize goal-directed medical therapy for management of non-ischemic  cardiomyopathy. Further workup of syncope per rounding and hospitalist teams.  Consider ambulatory cardiac telemetry at discharge. If no evidence of bleeding or vascular injury, DOAC could be started as soon as tomorrow.  We will restart IV heparin 2 hours after TR band removal. Medical therapy and risk factor modification to prevent progression of coronary artery disease. Nelva Bush, MD [facility]  US Carotid Bilateral  Result Date: 10/31/2020 CLINICAL DATA:  Syncope and collapse EXAM: BILATERAL CAROTID DUPLEX ULTRASOUND TECHNIQUE: Pearline Cables scale imaging, color Doppler and duplex ultrasound were performed of bilateral carotid and vertebral arteries in the neck. COMPARISON:  None. FINDINGS: Criteria: Quantification of carotid stenosis is based on velocity parameters that correlate the residual internal carotid diameter with NASCET-based stenosis levels, using the diameter of the distal internal carotid lumen as the denominator for stenosis measurement. The following velocity measurements were obtained: RIGHT ICA: 68 cm/sec CCA: 85 cm/sec SYSTOLIC ICA/CCA RATIO:  0.9 ECA: 170 cm/sec LEFT ICA: 75 cm/sec CCA: 82 cm/sec SYSTOLIC ICA/CCA RATIO:  1.1 ECA: 136 cm/sec RIGHT CAROTID ARTERY: Antegrade RIGHT VERTEBRAL ARTERY:  Antegrade LEFT CAROTID ARTERY:  Antegrade LEFT VERTEBRAL ARTERY:  Antegrade Upper extremity blood pressures: Not provided Mild plaque in the distal common carotid arteries bilaterally, as well as at the bifurcation. IMPRESSION: Velocities consistent with less than 50% stenosis in the bilateral carotid arteries. Electronically Signed   By: Merilyn Baba M.D.   On: 10/31/2020 01:49   US Venous Img Lower Bilateral (DVT)  Addendum Date: 10/31/2020   ADDENDUM REPORT: 10/31/2020 18:45 ADDENDUM: Voice recognition error in the original findings. Since this was a bilateral study, there was no contralateral femoral vein. Electronically Signed   By: Rolm Baptise M.D.   On: 10/31/2020 18:45   Result  Date: 10/31/2020 CLINICAL DATA:  Bilateral lower extremity swelling EXAM: BILATERAL LOWER EXTREMITY VENOUS DOPPLER ULTRASOUND TECHNIQUE: Gray-scale sonography with compression, as well as color and duplex ultrasound, were performed to evaluate the deep venous system(s) from the level of the common femoral vein through the popliteal and proximal calf veins. COMPARISON:  None. FINDINGS: VENOUS Normal compressibility of the common femoral, superficial femoral, and popliteal veins, as well as the visualized calf veins. Visualized portions of profunda femoral vein and great saphenous vein unremarkable. No filling defects to suggest DVT on grayscale or color  Doppler imaging. Doppler waveforms show normal direction of venous flow, normal respiratory plasticity and response to augmentation. Limited views of the contralateral common femoral vein are unremarkable. OTHER None. Limitations: none IMPRESSION: Negative. Electronically Signed: By: Rolm Baptise M.D. On: 10/31/2020 18:41   ECHOCARDIOGRAM COMPLETE  Result Date: 10/31/2020    ECHOCARDIOGRAM REPORT   Patient Name:   Donald [name] Date of Exam: 10/31/2020 Medical Rec #:  [id number]         Height:       64.0 in Accession #:    [id number]        Weight:       265.7 lb Date of Birth:  [birth year]        BSA:          2.207 m Patient Age:    40 years          BP:           143/54 mmHg Patient Gender: M                 HR:           56 bpm. Exam Location:  [facility] Procedure: 2D Echo, Color Doppler, Cardiac Doppler and Intracardiac            Opacification Agent Indications:     R55 Syncope  History:         Patient has prior history of Echocardiogram examinations, most                  recent 10/01/2018. HFrEF; Risk Factors:Hypertension, Diabetes                  and Sleep Apnea.  Sonographer:     Charmayne Sheer Referring Phys:  [id number] Gretta Cool PATEL Diagnosing Phys: Nelva Bush MD  Sonographer Comments: Suboptimal apical window and suboptimal subcostal window.  IMPRESSIONS  1. Left ventricular ejection fraction, by estimation, is 45 to 50%. The left ventricle has mildly decreased function. The left ventricle demonstrates global hypokinesis. There is moderate left ventricular hypertrophy. Left ventricular diastolic parameters are consistent with Grade III diastolic dysfunction (restrictive). Elevated left atrial pressure.  2. Right ventricular systolic function is normal. The right ventricular size is normal.  3. Left atrial size was mildly dilated.  4. The mitral valve is degenerative. Mild mitral valve regurgitation. No evidence of mitral stenosis.  5. The aortic valve has an indeterminant number of cusps. There is mild calcification of the aortic valve. There is moderate thickening of the aortic valve. Aortic valve regurgitation is trivial. Mild to moderate aortic valve sclerosis/calcification is present, without any evidence of aortic stenosis. FINDINGS  Left Ventricle: Left ventricular ejection fraction, by estimation, is 45 to 50%. The left ventricle has mildly decreased function. The left ventricle demonstrates global hypokinesis. Definity contrast agent was given IV to delineate the left ventricular  endocardial borders. The left ventricular internal cavity size was normal in size. There is moderate left ventricular hypertrophy. Left ventricular diastolic parameters are consistent with Grade III diastolic dysfunction (restrictive). Elevated left atrial pressure. Right Ventricle: The right ventricular size is normal. No increase in right ventricular wall thickness. Right ventricular systolic function is normal. Left Atrium: Left atrial size was mildly dilated. Right Atrium: Right atrial size was not well visualized. Pericardium: The pericardium was not well visualized. Mitral Valve: The mitral valve is degenerative in appearance. There is mild thickening of the mitral valve leaflet(s). There is mild calcification of the mitral valve leaflet(s).  Mild mitral valve  regurgitation. No evidence of mitral valve stenosis. MV peak gradient, 4.8 mmHg. The mean mitral valve gradient is 1.0 mmHg. Tricuspid Valve: The tricuspid valve is not well visualized. Tricuspid valve regurgitation is not demonstrated. Aortic Valve: The aortic valve has an indeterminant number of cusps. There is mild calcification of the aortic valve. There is moderate thickening of the aortic valve. There is mild aortic valve annular calcification. Aortic valve regurgitation is trivial. Mild to moderate aortic valve sclerosis/calcification is present, without any evidence of aortic stenosis. Aortic valve mean gradient measures 7.0 mmHg. Aortic valve peak gradient measures 11.7 mmHg. Aortic valve area, by VTI measures 2.78 cm. Pulmonic Valve: The pulmonic valve was not well visualized. Pulmonic valve regurgitation is not visualized. No evidence of pulmonic stenosis. Aorta: The aortic root is normal in size and structure. Pulmonary Artery: The pulmonary artery is of normal size. IAS/Shunts: The interatrial septum was not well visualized.  LEFT VENTRICLE PLAX 2D LVIDd:         5.10 cm      Diastology LVIDs:         3.93 cm      LV e' medial:   6.31 cm/s LV PW:         1.53 cm      LV E/e' medial: 17.0 LV IVS:        1.31 cm LVOT diam:     2.20 cm LV SV:         108 LV SV Index:   49 LVOT Area:     3.80 cm  LV Volumes (MOD) LV vol d, MOD A2C: 158.0 ml LV vol d, MOD A4C: 187.0 ml LV vol s, MOD A2C: 94.2 ml LV vol s, MOD A4C: 129.0 ml LV SV MOD A2C:     63.8 ml LV SV MOD A4C:     187.0 ml LV SV MOD BP:      62.0 ml LEFT ATRIUM             Index LA diam:        4.10 cm 1.86 cm/m LA Vol (A2C):   67.8 ml 30.72 ml/m LA Vol (A4C):   67.2 ml 30.45 ml/m LA Biplane Vol: 70.9 ml 32.12 ml/m  AORTIC VALVE                     PULMONIC VALVE AV Area (Vmax):    2.87 cm      PV Vmax:       1.00 m/s AV Area (Vmean):   2.67 cm      PV Vmean:      68.300 cm/s AV Area (VTI):     2.78 cm      PV VTI:        0.203 m AV Vmax:            171.00 cm/s   PV Peak grad:  4.0 mmHg AV Vmean:          126.000 cm/s  PV Mean grad:  2.0 mmHg AV VTI:            0.388 m AV Peak Grad:      11.7 mmHg AV Mean Grad:      7.0 mmHg LVOT Vmax:         129.00 cm/s LVOT Vmean:        88.600 cm/s LVOT VTI:          0.284 m LVOT/AV VTI ratio: 0.73  AORTA Ao Root diam: 3.30 cm MITRAL VALVE MV Area (PHT): 3.77 cm     SHUNTS MV Area VTI:   3.77 cm     Systemic VTI:  0.28 m MV Peak grad:  4.8 mmHg     Systemic Diam: 2.20 cm MV Mean grad:  1.0 mmHg MV Vmax:       1.09 m/s MV Vmean:      54.1 cm/s MV Decel Time: 201 msec MV E velocity: 107.00 cm/s MV A velocity: 39.80 cm/s MV E/A ratio:  2.69 Christopher End MD Electronically signed by Nelva Bush MD Signature Date/Time: 10/31/2020/12:38:25 PM    Final    (Echo, Carotid, EGD, Colonoscopy, ERCP)    Subjective: Pt c/o fatigue    Discharge Exam: Vitals:   11/02/20 0724 11/02/20 1129  BP: (!) 161/73 (!) 129/53  Pulse: (!) 59 (!) 55  Resp: 18 18  Temp: 98.3 F (36.8 C) 98.5 F (36.9 C)  SpO2: 96% 95%   Vitals:   11/02/20 0223 11/02/20 0446 11/02/20 0724 11/02/20 1129  BP: (!) 146/62 (!) 156/54 (!) 161/73 (!) 129/53  Pulse: (!) 57 (!) 52 (!) 59 (!) 55  Resp: 17  18 18   Temp: 98.6 F (37 C) 97.9 F (36.6 C) 98.3 F (36.8 C) 98.5 F (36.9 C)  TempSrc:  Oral  Oral  SpO2: 94% 94% 96% 95%  Weight: 116.4 kg     Height:        General: Pt is alert, awake, not in acute distress Cardiovascular: S1/S2 +, no rubs, no gallops Respiratory: CTA bilaterally, no wheezing, no rhonchi Abdominal: Soft, NT, obese, bowel sounds + Extremities: no cyanosis    The results of significant diagnostics from this hospitalization (including imaging, microbiology, ancillary and laboratory) are listed below for reference.     Microbiology: Recent Results (from the past 240 hour(s))  Resp Panel by RT-PCR (Flu A&B, Covid) Nasopharyngeal Swab     Status: None   Collection Time: 10/30/20  5:04 PM   Specimen:  Nasopharyngeal Swab; Nasopharyngeal(NP) swabs in vial transport medium  Result Value Ref Range Status   SARS Coronavirus 2 by RT PCR NEGATIVE NEGATIVE Final    Comment: (NOTE) SARS-CoV-2 target nucleic acids are NOT DETECTED.  The SARS-CoV-2 RNA is generally detectable in upper respiratory specimens during the acute phase of infection. The lowest concentration of SARS-CoV-2 viral copies this assay can detect is 138 copies/mL. A negative result does not preclude SARS-Cov-2 infection and should not be used as the sole basis for treatment or other patient management decisions. A negative result may occur with  improper specimen collection/handling, submission of specimen other than nasopharyngeal swab, presence of viral mutation(s) within the areas targeted by this assay, and inadequate number of viral copies(<138 copies/mL). A negative result must be combined with clinical observations, patient history, and epidemiological information. The expected result is Negative.  Fact Sheet for Patients:  EntrepreneurPulse.com.au  Fact Sheet for Healthcare Providers:  IncredibleEmployment.be  This test is no t yet approved or cleared by the [place] FDA and  has been authorized for detection and/or diagnosis of SARS-CoV-2 by FDA under an Emergency Use Authorization (EUA). This EUA will remain  in effect (meaning this test can be used) for the duration of the COVID-19 declaration under Section 564(b)(1) of the Act, 21 U.S.C.section 360bbb-3(b)(1), unless the authorization is terminated  or revoked sooner.       Influenza A by PCR NEGATIVE NEGATIVE Final   Influenza B by PCR NEGATIVE  NEGATIVE Final    Comment: (NOTE) The Xpert Xpress SARS-CoV-2/FLU/RSV plus assay is intended as an aid in the diagnosis of influenza from Nasopharyngeal swab specimens and should not be used as a sole basis for treatment. Nasal washings and aspirates are unacceptable for  Xpert Xpress SARS-CoV-2/FLU/RSV testing.  Fact Sheet for Patients: EntrepreneurPulse.com.au  Fact Sheet for Healthcare Providers: IncredibleEmployment.be  This test is not yet approved or cleared by the [place] FDA and has been authorized for detection and/or diagnosis of SARS-CoV-2 by FDA under an Emergency Use Authorization (EUA). This EUA will remain in effect (meaning this test can be used) for the duration of the COVID-19 declaration under Section 564(b)(1) of the Act, 21 U.S.C. section 360bbb-3(b)(1), unless the authorization is terminated or revoked.  Performed at [facility], [address] [place], [place] [address]      Labs: BNP (last 3 results) No results for input(s): BNP in the last 8760 hours. Basic Metabolic Panel: Recent Labs  Lab 10/30/20 1413 10/31/20 1355 11/01/20 0857 11/02/20 0251  NA 141 138 138 135  K 2.9* 4.2 3.9 4.0  CL 98 102 98 99  CO2 29 29 30 27   GLUCOSE 88 286* 262* 290*  BUN 23 24* 27* 30*  CREATININE 1.42* 1.48* 1.55* 1.59*  CALCIUM 9.3 8.5* 9.2 8.7*  MG  --  1.8  --   --    Liver Function Tests: Recent Labs  Lab 10/31/20 1355  AST 23  ALT 15  ALKPHOS 72  BILITOT 1.7*  PROT 5.6*  ALBUMIN 3.1*   No results for input(s): LIPASE, AMYLASE in the last 168 hours. No results for input(s): AMMONIA in the last 168 hours. CBC: Recent Labs  Lab 10/30/20 1413 10/31/20 1651 11/01/20 0702 11/02/20 0251  WBC 9.8 6.5 6.2 7.5  HGB 15.4 13.9 13.6 13.8  HCT 45.6 43.7 43.3 41.9  MCV 88.4 88.5 89.8 87.1  PLT 192 198 177 195   Cardiac Enzymes: No results for input(s): CKTOTAL, CKMB, CKMBINDEX, TROPONINI in the last 168 hours. BNP: Invalid input(s): POCBNP CBG: Recent Labs  Lab 11/01/20 1147 11/01/20 1418 11/01/20 1740 11/02/20 0726 11/02/20 1131  GLUCAP 231* 197* 268* 274* 346*   D-Dimer No results for input(s): DDIMER in the last 72 hours.  Hgb A1c No results for  input(s): HGBA1C in the last 72 hours.  Lipid Profile Recent Labs    10/31/20 1355  CHOL 119  HDL 25*  LDLCALC 51  TRIG 214*  CHOLHDL 4.8   Thyroid function studies Recent Labs    10/31/20 1355  TSH 2.156   Anemia work up No results for input(s): VITAMINB12, FOLATE, FERRITIN, TIBC, IRON, RETICCTPCT in the last 72 hours. Urinalysis    Component Value Date/Time   COLORURINE STRAW (A) 10/30/2020 1436   APPEARANCEUR CLEAR (A) 10/30/2020 1436   LABSPEC 1.005 10/30/2020 1436   PHURINE 6.0 10/30/2020 1436   GLUCOSEU NEGATIVE 10/30/2020 1436   GLUCOSEU NEGATIVE 10/09/2015 1349   HGBUR MODERATE (A) 10/30/2020 1436   BILIRUBINUR NEGATIVE 10/30/2020 1436   KETONESUR NEGATIVE 10/30/2020 1436   PROTEINUR 30 (A) 10/30/2020 1436   UROBILINOGEN 0.2 10/09/2015 1349   NITRITE NEGATIVE 10/30/2020 1436   LEUKOCYTESUR NEGATIVE 10/30/2020 1436   Sepsis Labs Invalid input(s): PROCALCITONIN,  WBC,  LACTICIDVEN Microbiology Recent Results (from the past 240 hour(s))  Resp Panel by RT-PCR (Flu A&B, Covid) Nasopharyngeal Swab     Status: None   Collection Time: 10/30/20  5:04 PM   Specimen: Nasopharyngeal  Swab; Nasopharyngeal(NP) swabs in vial transport medium  Result Value Ref Range Status   SARS Coronavirus 2 by RT PCR NEGATIVE NEGATIVE Final    Comment: (NOTE) SARS-CoV-2 target nucleic acids are NOT DETECTED.  The SARS-CoV-2 RNA is generally detectable in upper respiratory specimens during the acute phase of infection. The lowest concentration of SARS-CoV-2 viral copies this assay can detect is 138 copies/mL. A negative result does not preclude SARS-Cov-2 infection and should not be used as the sole basis for treatment or other patient management decisions. A negative result may occur with  improper specimen collection/handling, submission of specimen other than nasopharyngeal swab, presence of viral mutation(s) within the areas targeted by this assay, and inadequate number of  viral copies(<138 copies/mL). A negative result must be combined with clinical observations, patient history, and epidemiological information. The expected result is Negative.  Fact Sheet for Patients:  EntrepreneurPulse.com.au  Fact Sheet for Healthcare Providers:  IncredibleEmployment.be  This test is no t yet approved or cleared by the [place] FDA and  has been authorized for detection and/or diagnosis of SARS-CoV-2 by FDA under an Emergency Use Authorization (EUA). This EUA will remain  in effect (meaning this test can be used) for the duration of the COVID-19 declaration under Section 564(b)(1) of the Act, 21 U.S.C.section 360bbb-3(b)(1), unless the authorization is terminated  or revoked sooner.       Influenza A by PCR NEGATIVE NEGATIVE Final   Influenza B by PCR NEGATIVE NEGATIVE Final    Comment: (NOTE) The Xpert Xpress SARS-CoV-2/FLU/RSV plus assay is intended as an aid in the diagnosis of influenza from Nasopharyngeal swab specimens and should not be used as a sole basis for treatment. Nasal washings and aspirates are unacceptable for Xpert Xpress SARS-CoV-2/FLU/RSV testing.  Fact Sheet for Patients: EntrepreneurPulse.com.au  Fact Sheet for Healthcare Providers: IncredibleEmployment.be  This test is not yet approved or cleared by the [place] FDA and has been authorized for detection and/or diagnosis of SARS-CoV-2 by FDA under an Emergency Use Authorization (EUA). This EUA will remain in effect (meaning this test can be used) for the duration of the COVID-19 declaration under Section 564(b)(1) of the Act, 21 U.S.C. section 360bbb-3(b)(1), unless the authorization is terminated or revoked.  Performed at [facility], [address], [place], [place] [address]      Time coordinating discharge: Over 30 minutes  SIGNED:   Wyvonnia Dusky, MD  [facility] 11/02/2020, 2:43 PM Pager   If 7PM-7AM, please contact night-coverage

## 2020-11-02 NOTE — Care Management Important Message (Signed)
Important Message  Patient Details  Name: Donald [name] MRN: [id number] Date of Birth: 1945-02-06   Medicare Important Message Given:  Yes     Dannette Barbara 11/02/2020, 2:41 PM

## 2020-11-03 DIAGNOSIS — R55 Syncope and collapse: Secondary | ICD-10-CM | POA: Diagnosis not present

## 2020-11-09 ENCOUNTER — Ambulatory Visit (INDEPENDENT_AMBULATORY_CARE_PROVIDER_SITE_OTHER): Payer: Medicare Other | Admitting: Endocrinology

## 2020-11-09 ENCOUNTER — Other Ambulatory Visit: Payer: Self-pay

## 2020-11-09 VITALS — BP 130/60 | HR 50 | Ht 64.0 in | Wt 254.0 lb

## 2020-11-09 DIAGNOSIS — I6523 Occlusion and stenosis of bilateral carotid arteries: Secondary | ICD-10-CM

## 2020-11-09 DIAGNOSIS — E1122 Type 2 diabetes mellitus with diabetic chronic kidney disease: Secondary | ICD-10-CM | POA: Diagnosis not present

## 2020-11-09 DIAGNOSIS — N1832 Chronic kidney disease, stage 3b: Secondary | ICD-10-CM | POA: Diagnosis not present

## 2020-11-09 DIAGNOSIS — Z794 Long term (current) use of insulin: Secondary | ICD-10-CM | POA: Diagnosis not present

## 2020-11-09 DIAGNOSIS — E119 Type 2 diabetes mellitus without complications: Secondary | ICD-10-CM | POA: Diagnosis not present

## 2020-11-09 LAB — POCT GLYCOSYLATED HEMOGLOBIN (HGB A1C): Hemoglobin A1C: 7.3 % — AB (ref 4.0–5.6)

## 2020-11-09 MED ORDER — INSULIN NPH (HUMAN) (ISOPHANE) 100 UNIT/ML ~~LOC~~ SUSP: 230.0000 [IU] | Freq: Every day | SUBCUTANEOUS | 5 refills | Status: DC

## 2020-11-09 MED ORDER — FREESTYLE LIBRE 3 SENSOR MISC: 1.0000 | 3 refills | Status: DC

## 2020-11-09 MED ORDER — INSULIN REGULAR HUMAN 100 UNIT/ML IJ SOLN: INTRAMUSCULAR | 0 refills | Status: DC

## 2020-11-09 MED ORDER — FREESTYLE LIBRE 2 READER DEVI: 1.0000 | Freq: Once | 1 refills | Status: AC

## 2020-11-09 MED ORDER — TIRZEPATIDE 2.5 MG/0.5ML ~~LOC~~ SOAJ: 2.5000 mg | SUBCUTANEOUS | 3 refills | Status: DC

## 2020-11-09 NOTE — Progress Notes (Signed)
Subjective:    Patient ID: Donald [name], male    DOB: Nov 25, 1945, 75 y.o.   MRN: [id number]  HPI Pt returns for f/u of diabetes mellitus: DM type: Insulin-requiring type 2 Dx'ed: 3220 Complications: PN, CAD and stage 4 CRI.   Therapy: insulin since 1991.  DKA: never.  Severe hypoglycemia: never.  Pancreatitis: never.  SDOH: he declines name brand meds.  Other: he takes multiple daily injections; he declines weight-loss surgery; he has severe insulin resistance; he declines pump; He often eats 2 meals per day, so he takes only 2 daily injections of novolog.  Interval history: no cbg record, but states cbg varies from 57-240.  No recent steroids.  He has mild hypoglycemia approx twice per month.  He had an episode of severe hypoglycemia before lunch, last week. However, cbg by paramedics was 79.  He is wearing a heart monitor.   Past Medical History:  Diagnosis Date   (HFpEF) heart failure with preserved ejection fraction ([place])    a. 09/2018 Echo: EF 60-65%. PASP 65mmHg. Mild-mod LAE. Mild MR/TR.   ANEMIA-IRON DEFICIENCY 07/26/2006   Anxiety    ASTHMA 07/26/2006   Asthma    Back pain, chronic    Bell's palsy    CKD (chronic kidney disease), stage III ([place])    COLONIC POLYPS, HX OF 07/26/2006   COVID-19 virus infection 01/2020   DEPRESSION 03/14/2009   DIABETES MELLITUS, TYPE II 07/26/2006   [place] DISEASE, LUMBAR 10/05/2007   DVT 12/03/2007   DYSLIPIDEMIA 04/13/2009   Dyspnea    when gets up and walks around and back is hurting really bad -only Shortness of breath  then   GERD 07/26/2006   Gout    Heart murmur    History of kidney stones    HYPERTENSION 07/26/2006   INSOMNIA 08/21/2007   Neuropathy    Nonobstructive CAD (coronary artery disease)    a. 2012 Cath: no high grade stenosis; b. 2018 MV: No ischemia. Attenuation artifact.    OBSTRUCTIVE SLEEP APNEA 12/03/2007   Use C-PAP   PERIPHERAL NEUROPATHY 07/26/2006   Pernicious anemia 11/20/2006   PULMONARY EMBOLISM 10/05/2007     Past Surgical History:  Procedure Laterality Date   [place], 06/14/2008   Dr. Trenton Gammon at [facility] [phone])   [place] Right 10/2017   CATARACT EXTRACTION W/PHACO Left 05/30/2014   Procedure: CATARACT EXTRACTION PHACO AND INTRAOCULAR LENS PLACEMENT ([place]);  Surgeon: Estill Cotta, MD;  Location: [facility] ORS;  Service: Ophthalmology;  Laterality: Left;  [name] 01:20 AP% 23.7 CDE 31.86   COLONOSCOPY  10/2002   HP, SSA, TA, rpt 3 yrs ([place])   COLONOSCOPY  01/2005   diverticulosis rpt 5 yrs ([place])    IR NEPHROSTOMY PLACEMENT LEFT  10/19/2018   IR URETERAL STENT LEFT NEW ACCESS W/O SEP NEPHROSTOMY CATH  10/19/2018   LEFT HEART CATH AND CORONARY ANGIOGRAPHY N/A 11/01/2020   Procedure: LEFT HEART CATH AND CORONARY ANGIOGRAPHY;  Surgeon: Nelva Bush, MD;  Location: [facility];  Service: Cardiovascular;  Laterality: N/A;   LITHOTRIPSY     X 2   NEPHROLITHOTOMY Left 10/19/2018   Procedure: NEPHROLITHOTOMY PERCUTANEOUS;  Surgeon: Franchot Gallo, MD;  Location: [facility];  Service: Urology;  Laterality: Left;  3 HRS   PARATHYROIDECTOMY Right 04/18/2016   PARATHYROIDECTOMY for cyst [name], MD)   SHOULDER ARTHROSCOPY W/ ROTATOR CUFF REPAIR Right    TONSILLECTOMY  Social History   Socioeconomic History   Marital status: Married    Spouse name: Not on file   Number of children: Not on file   Years of education: Not on file   Highest education level: Not on file  Occupational History   Occupation: Disabled    Employer: [employer]  Tobacco Use   Smoking status: Never   Smokeless tobacco: Never  Vaping Use   Vaping Use: Never used  Substance and Sexual Activity   Alcohol use: No   Drug use: No   Sexual activity: Not on file  Other Topics Concern   Not on file  Social History Narrative   Married   Children   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   Activity is  severely limited by medical problems   Diet is "good".   Never a smoker   Alcohol: none         Social Determinants of Radio broadcast assistant Strain: Medium Risk   Difficulty of Paying Living Expenses: Somewhat hard  Food Insecurity: Not on file  Transportation Needs: Not on file  Physical Activity: Not on file  Stress: Not on file  Social Connections: Not on file  Intimate Partner Violence: Not on file    Current Outpatient Medications on File Prior to Visit  Medication Sig Dispense Refill   albuterol (VENTOLIN HFA) 108 (90 Base) MCG/ACT inhaler Inhale 2 puffs into the lungs every 6 (six) hours as needed for wheezing or shortness of breath. 8 g 0   allopurinol (ZYLOPRIM) 300 MG tablet Take 1 tablet (300 mg total) by mouth daily. 90 tablet 3   ALPRAZolam (XANAX) 1 MG tablet TAKE 1 TABLET BY MOUTH AT BEDTIME IF NEEDED FOR SLEEP 30 tablet 3   apixaban (ELIQUIS) 5 MG TABS tablet Take 1 tablet (5 mg total) by mouth 2 (two) times daily. 60 tablet 0   aspirin EC 81 MG tablet Take 1 tablet (81 mg total) by mouth daily.     atorvastatin (LIPITOR) 80 MG tablet Take 1 tablet (80 mg total) by mouth daily. 30 tablet 0   Cholecalciferol (VITAMIN D3) 25 MCG (1000 UT) CAPS Take 1 capsule (1,000 Units total) by mouth daily. 30 capsule    colchicine (COLCRYS) 0.6 MG tablet Take 1 tablet (0.6 mg total) by mouth daily as needed (gout flare). On first day of gout flare may take 2 tablets at once. 30 tablet 4   cyanocobalamin (,VITAMIN B-12,) 1000 MCG/ML injection Inject 1 mL (1,000 mcg total) into the muscle every 30 (thirty) days. 1 mL 11   cyclobenzaprine (FLEXERIL) 10 MG tablet Take 10 mg by mouth 3 (three) times daily as needed for muscle spasms.     doxazosin (CARDURA) 1 MG tablet Take 1 tablet (1 mg total) by mouth 2 (two) times daily. 180 tablet 3   fluticasone (FLONASE) 50 MCG/ACT nasal spray Place 2 sprays into both nostrils daily. 16 g 1   furosemide (LASIX) 80 MG tablet TAKE ONE TABLET  (80 MG) BY MOUTH EVERY DAY take an extra lasix for swelling 1-2 a week 100 tablet 3   gabapentin (NEURONTIN) 600 MG tablet TAKE TWO TABLETS AT BEDTIME 180 tablet 3   glucose blood (GE100 BLOOD GLUCOSE TEST) test strip 1 each by Other route 4 (four) times daily. E11.9 360 each 0   HYDROcodone-acetaminophen (NORCO) 10-325 MG tablet Take 1 tablet by mouth every 6 (six) hours as needed for moderate pain ((typically once to twice daily if  needed)).  30 tablet 0   isosorbide mononitrate (IMDUR) 30 MG 24 hr tablet Take 1 tablet (30 mg total) by mouth daily. 30 tablet 0   losartan (COZAAR) 25 MG tablet Take 1 tablet (25 mg total) by mouth at bedtime. 90 tablet 3   Naphazoline-Pheniramine (OPCON-A OP) Place 1 drop into both eyes 3 (three) times daily as needed (itchy eyes.).      ondansetron (ZOFRAN) 4 MG tablet Take 1 tablet (4 mg total) by mouth every 4 (four) hours as needed for nausea. 10 tablet 0   potassium chloride (KLOR-CON) 20 MEQ tablet Take 2 tablets (40 mEq total) by mouth daily. 60 tablet 0   Sennosides-Docusate Sodium (STOOL SOFTENER & LAXATIVE PO) Take 2 tablets by mouth at bedtime.     Syringe/Needle, Disp, (SYRINGE 3CC/22GX1-1/2") 22G X 1-1/2" 3 ML MISC Use to administer monthly b12 shots 50 each 0   No current facility-administered medications on file prior to visit.    Allergies  Allergen Reactions   Codeine Other (See Comments)    REACTION: chest pain   Pioglitazone Other (See Comments)    REACTION to Actos: swelling in ankles    Family History  Problem Relation Age of Onset   Cancer Mother        Breast Cancer   Cancer Sister        Breast Cancer   Diabetes Father    Heart disease Father    Diabetes Paternal Grandfather     BP 130/60 (BP Location: Right Arm, Patient Position: Sitting, Cuff Size: Large)   Pulse (!) 50   Ht 5\' 4"  (1.626 m)   Wt 254 lb (115.2 kg)   SpO2 98%   BMI 43.60 kg/m    Review of Systems     Objective:   Physical Exam    A1c=7.3%     Assessment & Plan:  Insulin-requiring type 2 DM Hypoglycemia, severe.  We discussed.  We'll start CGM, as pt needs careful monitoring for this.  Patient Instructions  I have sent prescriptions to your pharmacy, to add [facility], and for the continuous glucose monitor Please reduce both insulins.   After any steroid injection, take 10 extra units for any blood sugar over 200.   check your blood sugar twice a day.  vary the time of day when you check, between before the 3 meals, and at bedtime.  also check if you have symptoms of your blood sugar being too high or too low.  please keep a record of the readings and bring it to your next appointment here (or you can bring the meter itself).  You can write it on any piece of paper.  please call us sooner if your blood sugar goes below 70, or if you have a lot of readings over 200.   Please come back for a follow-up appointment in 1 month.

## 2020-11-09 NOTE — Patient Instructions (Addendum)
I have sent prescriptions to your pharmacy, to add [facility], and for the continuous glucose monitor Please reduce both insulins.   After any steroid injection, take 10 extra units for any blood sugar over 200.   check your blood sugar twice a day.  vary the time of day when you check, between before the 3 meals, and at bedtime.  also check if you have symptoms of your blood sugar being too high or too low.  please keep a record of the readings and bring it to your next appointment here (or you can bring the meter itself).  You can write it on any piece of paper.  please call us sooner if your blood sugar goes below 70, or if you have a lot of readings over 200.   Please come back for a follow-up appointment in 1 month.

## 2020-11-10 ENCOUNTER — Telehealth: Payer: Self-pay | Admitting: Endocrinology

## 2020-11-10 ENCOUNTER — Other Ambulatory Visit: Payer: Self-pay

## 2020-11-10 DIAGNOSIS — N1832 Chronic kidney disease, stage 3b: Secondary | ICD-10-CM

## 2020-11-10 DIAGNOSIS — Z794 Long term (current) use of insulin: Secondary | ICD-10-CM

## 2020-11-10 MED ORDER — FREESTYLE LIBRE 2 SENSOR MISC: 3 refills | Status: DC

## 2020-11-10 NOTE — Telephone Encounter (Signed)
Sherry with [facility] requests to be called at ph# [phone] re: PHARM received YY'P for [facility] 2 Reader and [employer] which do not work together. Requests clarification. Also, Judeen Hammans states regardless of whether EJ'Y will be for [employer] 2 or [employer] 3, PA is required.

## 2020-11-12 NOTE — Progress Notes (Signed)
Date:  11/13/2020   ID:  Donald [name], DOB 1945-08-31, MRN [id number]  Patient Location:  [place] [id number]   Provider location:   Donald [name], Donald [name]  PCP:  Donald Bush, MD  Cardiologist:  Donald [name] [facility]   Chief Complaint  Patient presents with   Follow up cardiac cath     "Doing well." Medications reviewed by the patient verbally.     History of Present Illness:    Donald [name] is a 75 y.o. male  past medical history of Long-standing diabetes, HBA1C 7.7 hyperlipidemia,  coronary artery disease,  Prior cardiac catheterization 2012 with 40% coronary disease noted Diffuse three-vessel coronary calcification on CT scan PAD,   chronic kidney disease, hypercalcemia,   OSA on CPAP,  Surgery of his parathyroid Chronic back pain Who presents for follow-up of his coronary artery disease  LOV 04/2020 Discussed recent hospitalization, Syncope at rehab Low potassium 2.9 , low sugar also noted to be in atrial fibrillation  Workup as below Cardiac catheterization in October 2022 Mild, non-obstructive coronary artery disease, as detailed below.  No significant stenosis identified to explain syncope or cardiomyopathy. Normal left ventricular filling pressure. Mild-moderate aortic valve stenosis based on peak-to-peak gradient (~20 mmHg).  Echo showed EF of 45 to 50% with global hypokinesis, grade III diastolic dysfunction.   Carotid ultrasound less than 50% stenosis bilaterally.  MRI with no acute intracranial findings  Has not started new medications yet from d/c Felt the medications were not called in, reviewed they [facility]  Lab work reviewed A1C 7.3 Creatinine 1.59 BUN 30, stable numbers  Echocardiogram October 2022 Left ventricular ejection fraction, by estimation, is 45 to 50%. The  left ventricle has mildly decreased function. The left ventricle  demonstrates  global hypokinesis. There is moderate left ventricular  hypertrophy. Left ventricular diastolic  parameters are consistent with Grade III diastolic dysfunction  (restrictive). Elevated left atrial pressure.   2. [name] ventricular systolic function is normal. The [name] ventricular  size is normal.   Chronic back pain, also neck pain On pain meds  Compliant with CPAP Walks with a cane, no falls   cardiac risk factor Nonsmoker, cholesterol well controlled on statin, lovastatin, diabetes  EKG personally reviewed by myself on todays visit NSR rate 58 bpm,  no ST or T wave changes   Other past medical history reviewed Abnormal EKG leading to stress testing on prior clinic visit This showed no significant ischemia   Had parathyroid, surgery Now with vertigo Following surgery Mild improvement with meclizine, though still symptomatic    previous PET/CT imaging   mild to moderate calcified plaque in the carotid arteries [name] greater than left Mild to moderate plaque in the aortic arch Diffuse three-vessel coronary calcifications He does have mild to moderate plaquing in the branches off the mid to distal descending aorta  staghorn left renal calculus   Recent parathyroid spect scan  showed a focal abnormal sestamibi location suspicious for [name] inferior parathyroid adenoma. Nonspecific tracer localization within single normal-sized left axillary and [name] subpectoral lymph node.    elective parathyroidectomy on 02/29/2016.   Prior CV studies:   The following studies were reviewed today:   Past Medical History:  Diagnosis Date   (HFpEF) heart failure with preserved ejection fraction (Donald [name])    a. 09/2018 Echo: EF 60-65%. PASP 43mmHg. Mild-mod LAE. Mild MR/TR.   ANEMIA-IRON DEFICIENCY 07/26/2006   Anxiety  ASTHMA 07/26/2006   Asthma    Back pain, chronic    Bell's palsy    CKD (chronic kidney disease), stage III (Donald [name])    COLONIC POLYPS, HX OF 07/26/2006   COVID-19 virus  infection 01/2020   DEPRESSION 03/14/2009   DIABETES MELLITUS, TYPE II 07/26/2006   Donald [name] 10/05/2007   DVT 12/03/2007   DYSLIPIDEMIA 04/13/2009   Dyspnea    when gets up and walks around and back is hurting really bad -only Shortness of breath  then   GERD 07/26/2006   Gout    Heart murmur    History of kidney stones    HYPERTENSION 07/26/2006   INSOMNIA 08/21/2007   Neuropathy    Nonobstructive CAD (coronary artery disease)    a. 2012 Cath: no high grade stenosis; b. 2018 MV: No ischemia. Attenuation artifact.    OBSTRUCTIVE SLEEP APNEA 12/03/2007   Use C-PAP   PERIPHERAL NEUROPATHY 07/26/2006   Pernicious anemia 11/20/2006   PULMONARY EMBOLISM 10/05/2007   Past Surgical History:  Procedure Laterality Date   [place], 06/14/2008   Dr. Trenton [name] at [facility] [phone])   [place] [name] 10/2017   CATARACT EXTRACTION W/PHACO Left 05/30/2014   Procedure: CATARACT EXTRACTION PHACO AND INTRAOCULAR LENS PLACEMENT (Donald [name]);  Surgeon: Donald Cotta, MD;  Location: [facility] ORS;  Service: Ophthalmology;  Laterality: Left;  [name] 01:20 AP% 23.7 CDE 31.86   COLONOSCOPY  10/2002   HP, SSA, TA, rpt 3 yrs (Donald [name])   COLONOSCOPY  01/2005   diverticulosis rpt 5 yrs (Donald [name])    IR NEPHROSTOMY PLACEMENT LEFT  10/19/2018   IR URETERAL STENT LEFT NEW ACCESS W/O SEP NEPHROSTOMY CATH  10/19/2018   LEFT HEART CATH AND CORONARY ANGIOGRAPHY N/A 11/01/2020   Procedure: LEFT HEART CATH AND CORONARY ANGIOGRAPHY;  Surgeon: Donald Bush, MD;  Location: [facility];  Service: Cardiovascular;  Laterality: N/A;   LITHOTRIPSY     X 2   NEPHROLITHOTOMY Left 10/19/2018   Procedure: NEPHROLITHOTOMY PERCUTANEOUS;  Surgeon: Donald Gallo, MD;  Location: [facility];  Service: Urology;  Laterality: Left;  3 HRS   PARATHYROIDECTOMY [name] 04/18/2016   PARATHYROIDECTOMY for cyst Donald Manner, MD)   SHOULDER ARTHROSCOPY W/ ROTATOR  CUFF REPAIR [name]    TONSILLECTOMY       Current Meds  Medication Sig   albuterol (VENTOLIN HFA) 108 (90 Base) MCG/ACT inhaler Inhale 2 puffs into the lungs every 6 (six) hours as needed for wheezing or shortness of breath.   allopurinol (ZYLOPRIM) 300 MG tablet Take 1 tablet (300 mg total) by mouth daily.   ALPRAZolam (XANAX) 1 MG tablet TAKE 1 TABLET BY MOUTH AT BEDTIME IF NEEDED FOR SLEEP   apixaban (ELIQUIS) 5 MG TABS tablet Take 1 tablet (5 mg total) by mouth 2 (two) times daily.   Cholecalciferol (VITAMIN D3) 25 MCG (1000 UT) CAPS Take 1 capsule (1,000 Units total) by mouth daily.   colchicine (COLCRYS) 0.6 MG tablet Take 1 tablet (0.6 mg total) by mouth daily as needed (gout flare). On first day of gout flare may take 2 tablets at once.   Continuous Blood Gluc Receiver (FREESTYLE LIBRE 2 READER) DEVI 1 Device by Does not apply route once for 1 dose.   Continuous Blood Gluc Sensor (FREESTYLE LIBRE 3 SENSOR) MISC 1 Device by Does not apply route every 14 (fourteen) days. Place 1 sensor on the skin every 14 days.  Use to check glucose continuously   cyanocobalamin (,VITAMIN B-12,) 1000 MCG/ML injection Inject 1 mL (1,000 mcg total) into the muscle every 30 (thirty) days.   cyclobenzaprine (FLEXERIL) 10 MG tablet Take 10 mg by mouth 3 (three) times daily as needed for muscle spasms.   doxazosin (CARDURA) 1 MG tablet Take 1 tablet (1 mg total) by mouth 2 (two) times daily.   fluticasone (FLONASE) 50 MCG/ACT nasal spray Place 2 sprays into both nostrils daily.   furosemide (LASIX) 80 MG tablet TAKE ONE TABLET (80 MG) BY MOUTH EVERY DAY take an extra lasix for swelling 1-2 a week   gabapentin (NEURONTIN) 600 MG tablet TAKE TWO TABLETS AT BEDTIME   glucose blood (GE100 BLOOD GLUCOSE TEST) test strip 1 each by Other route 4 (four) times daily. E11.9   HYDROcodone-acetaminophen (NORCO) 10-325 MG tablet Take 1 tablet by mouth every 6 (six) hours as needed for moderate pain ((typically once to twice  daily if needed)).    insulin NPH Human (HUMULIN N) 100 UNIT/ML injection Inject 2.3 mLs (230 Units total) into the skin at bedtime.   insulin regular (NOVOLIN R RELION) 100 units/mL injection 3 times a day (just before each meal), 200-140-280 units, and syringes 4/day.   losartan (COZAAR) 25 MG tablet Take 1 tablet (25 mg total) by mouth at bedtime.   lovastatin (MEVACOR) 40 MG tablet Take 40 mg by mouth at bedtime.   Naphazoline-Pheniramine (OPCON-A OP) Place 1 drop into both eyes 3 (three) times daily as needed (itchy eyes.).    ondansetron (ZOFRAN) 4 MG tablet Take 1 tablet (4 mg total) by mouth every 4 (four) hours as needed for nausea.   potassium chloride (KLOR-CON) 20 MEQ tablet Take 2 tablets (40 mEq total) by mouth daily.   Sennosides-Docusate Sodium (STOOL SOFTENER & LAXATIVE PO) Take 2 tablets by mouth at bedtime.   Syringe/Needle, Disp, (SYRINGE 3CC/22GX1-1/2") 22G X 1-1/2" 3 ML MISC Use to administer monthly b12 shots   tirzepatide (MOUNJARO) 2.5 MG/0.5ML Pen Inject 2.5 mg into the skin once a week.   [DISCONTINUED] aspirin EC 81 MG tablet Take 1 tablet (81 mg total) by mouth daily.     Allergies:   Codeine and Pioglitazone   Social History   Tobacco Use   Smoking status: Never   Smokeless tobacco: Never  Vaping Use   Vaping Use: Never used  Substance Use Topics   Alcohol use: No   Drug use: No      Family Hx: The patient's family history includes Cancer in his mother and sister; Diabetes in his father and paternal grandfather; Heart disease in his father.  ROS:   Please see the history of present illness.    Review of Systems  Constitutional: Negative.   Respiratory: Negative.    Cardiovascular:  Positive for leg swelling.  Gastrointestinal: Negative.   Musculoskeletal:  Positive for back pain.  Neurological: Negative.   Psychiatric/Behavioral: Negative.    All other systems reviewed and are negative.   Labs/Other Tests and Data Reviewed:    Recent  Labs: 10/31/2020: ALT 15; Magnesium 1.8; TSH 2.156 11/02/2020: BUN 30; Creatinine, Ser 1.59; Hemoglobin 13.8; Platelets 195; Potassium 4.0; Sodium 135   Recent Lipid Panel Lab Results  Component Value Date/Time   CHOL 119 10/31/2020 01:55 PM   TRIG 214 (H) 10/31/2020 01:55 PM   HDL 25 (L) 10/31/2020 01:55 PM   CHOLHDL 4.8 10/31/2020 01:55 PM   LDLCALC 51 10/31/2020 01:55 PM   LDLDIRECT 52.2 05/25/2009 09:37  AM    Wt Readings from Last 3 Encounters:  11/13/20 255 lb 8 oz (115.9 kg)  11/09/20 254 lb (115.2 kg)  11/02/20 256 lb 9.9 oz (116.4 kg)     Exam:    Vital Signs: Vital signs may also be detailed in the HPI BP (!) 140/52 (BP Location: Left Arm, Patient Position: Sitting, Cuff Size: Normal)   Pulse (!) 58   Ht 5\' 5"  (1.651 m)   Wt 255 lb 8 oz (115.9 kg)   SpO2 98%   BMI 42.52 kg/m   Constitutional:  oriented to person, place, and time. No distress.  HENT:  Head: Grossly normal Eyes:  no discharge. No scleral icterus.  Neck: No JVD, no carotid bruits  Cardiovascular: Regular rate and rhythm, no murmurs appreciated Pulmonary/Chest: Clear to auscultation bilaterally, no wheezes or rails Abdominal: Soft.  no distension.  no tenderness.  Musculoskeletal: Normal range of motion Neurological:  normal muscle tone. Coordination normal. No atrophy Skin: Skin warm and dry Psychiatric: normal affect, pleasant  ASSESSMENT & PLAN:    Essential hypertension -  Imdur 30 daily was started in the hospital  Monitor pressure at home, call us if numbers get low  Dyslipidemia -  Cholesterol is at goal on the current lipid regimen. No changes to the medications were made.  Coronary artery disease involving native coronary artery of native heart with angina pectoris ([place]) Significant three-vessel coronary calcification seen on previous CT scan stress Myoview with no significant ischemia Prior cardiac catheterization in 2012 with known coronary disease mild in the left main, 40% in  other vessels  Recent cardiac catheterization, results reviewed Statin lovastatin given cholesterol was at goal on lovastatin 40 daily   Aortic atherosclerosis (HCC) Seen in the aortic arch and branches of the descending aorta Lipids at goal on lovastatin 40  Morbid obesity ([place]) We have encouraged continued exercise, careful diet management in an effort to lose weight.   Type 2 diabetes mellitus without complication, without long-term current use of insulin (HCC) Hemoglobin A1c of 7.3   Lower extremity edema Taking Lasix daily, likely exacerbating his chronic renal insufficiency stable   Total encounter time more than 25 minutes  Greater than 50% was spent in counseling and coordination of care with the patient   Signed, Ida Rogue, MD  11/13/2020 3:29 PM    Jerome [name] [address], [place], [place] [address]

## 2020-11-13 ENCOUNTER — Telehealth: Payer: Self-pay | Admitting: Pharmacy Technician

## 2020-11-13 ENCOUNTER — Other Ambulatory Visit: Payer: Self-pay | Admitting: Endocrinology

## 2020-11-13 ENCOUNTER — Other Ambulatory Visit (HOSPITAL_COMMUNITY): Payer: Self-pay

## 2020-11-13 ENCOUNTER — Other Ambulatory Visit: Payer: Self-pay

## 2020-11-13 ENCOUNTER — Encounter: Payer: Self-pay | Admitting: Cardiovascular Disease

## 2020-11-13 ENCOUNTER — Ambulatory Visit (INDEPENDENT_AMBULATORY_CARE_PROVIDER_SITE_OTHER): Payer: Medicare Other | Admitting: Cardiovascular Disease

## 2020-11-13 ENCOUNTER — Inpatient Hospital Stay: Payer: Medicare Other | Admitting: Family Medicine

## 2020-11-13 VITALS — BP 140/52 | HR 58 | Ht 65.0 in | Wt 255.5 lb

## 2020-11-13 DIAGNOSIS — I6523 Occlusion and stenosis of bilateral carotid arteries: Secondary | ICD-10-CM | POA: Diagnosis not present

## 2020-11-13 DIAGNOSIS — I25118 Atherosclerotic heart disease of native coronary artery with other forms of angina pectoris: Secondary | ICD-10-CM | POA: Diagnosis not present

## 2020-11-13 DIAGNOSIS — I739 Peripheral vascular disease, unspecified: Secondary | ICD-10-CM | POA: Diagnosis not present

## 2020-11-13 MED ORDER — APIXABAN 5 MG PO TABS: 5.0000 mg | ORAL_TABLET | Freq: Two times a day (BID) | ORAL | 3 refills | Status: DC

## 2020-11-13 MED ORDER — LOSARTAN POTASSIUM 25 MG PO TABS: 25.0000 mg | ORAL_TABLET | Freq: Every day | ORAL | 3 refills | Status: DC

## 2020-11-13 MED ORDER — DOXAZOSIN MESYLATE 1 MG PO TABS: 1.0000 mg | ORAL_TABLET | Freq: Two times a day (BID) | ORAL | 3 refills | Status: DC

## 2020-11-13 MED ORDER — FREESTYLE LIBRE 2 READER DEVI: 1.0000 | Freq: Once | 1 refills | Status: AC

## 2020-11-13 MED ORDER — FREESTYLE LIBRE 3 SENSOR MISC: 1.0000 | 3 refills | Status: DC

## 2020-11-13 MED ORDER — FUROSEMIDE 80 MG PO TABS: ORAL_TABLET | ORAL | 3 refills | Status: DC

## 2020-11-13 MED ORDER — POTASSIUM CHLORIDE CRYS ER 20 MEQ PO TBCR: 40.0000 meq | EXTENDED_RELEASE_TABLET | Freq: Every day | ORAL | 3 refills | Status: DC

## 2020-11-13 MED ORDER — LOVASTATIN 40 MG PO TABS
40.0000 mg | ORAL_TABLET | Freq: Every day | ORAL | 3 refills | Status: DC
Start: 2020-11-13 — End: 2021-05-14

## 2020-11-13 MED ORDER — ISOSORBIDE MONONITRATE ER 30 MG PO TB24: 30.0000 mg | ORAL_TABLET | Freq: Every day | ORAL | 3 refills | Status: DC

## 2020-11-13 NOTE — Telephone Encounter (Signed)
LVM for pt to let him know that Loanne Drilling would like for him to D/c mounjaro and he has sent a prescription to [facility], for the continuous glucose monitor sensors and reader.

## 2020-11-13 NOTE — Patient Instructions (Addendum)
Medication Instructions:  Eliquis assistance Please complete application Call Eliquis number given for assistance  Stay on lovastatin  Stop Aspirin  Please START imdur 30 mg daily  Refills on potassium, eliquis  If you need a refill on your cardiac medications before your next appointment, please call your pharmacy.   Lab work: No new labs needed  Testing/Procedures: No new testing needed   Follow-Up: At [facility], you and your health needs are our priority.  As part of our continuing mission to provide you with exceptional heart care, we have created designated Provider Care Teams.  These Care Teams include your primary Cardiologist (physician) and Advanced Practice Providers (APPs -  Physician Assistants and Nurse Practitioners) who all work together to provide you with the care you need, when you need it.  You will need a follow up appointment in 6 months  Providers on your designated Care Team:   Murray Hodgkins, NP Christell Faith, PA-C Cadence Kathlen Mody, [place]  COVID-19 Vaccine Information can be found at: [email or web address] For questions related to vaccine distribution or appointments, please email [email or web address] or call [phone].

## 2020-11-13 NOTE — Telephone Encounter (Signed)
Patient Advocate Encounter  Received PA request for [place].  Insurance verification completed.   Not covered under part D. Needs to go through Part B. [facility] is unable to bill part B.  The patient is insured through [place].   Spoke with pt. He's ok to fill through [facility] [phone]  He did not have the ability to take down the number right now, to call and make an act. Can you give this to him when you call? He doesn't use MyChart.   He also mentioned that he is not able to afford the new once a week medication you recently prescribed. (I assume it's the [facility]) After his ins his copay is still $232 a month. Is there any other assistance available, since he has Medicare?  He also asked that if he's not taking that, does he still need to come back in 1 month as initially requested or his usual 4 months? Please call to let him know. Thanks.

## 2020-11-14 ENCOUNTER — Ambulatory Visit: Payer: Medicare Other | Admitting: Podiatry

## 2020-11-14 ENCOUNTER — Ambulatory Visit: Payer: Medicare Other | Admitting: Physician Assistant

## 2020-11-15 ENCOUNTER — Other Ambulatory Visit: Payer: Self-pay

## 2020-11-15 ENCOUNTER — Encounter: Payer: Self-pay | Admitting: Family Medicine

## 2020-11-15 ENCOUNTER — Ambulatory Visit (INDEPENDENT_AMBULATORY_CARE_PROVIDER_SITE_OTHER): Payer: Medicare Other | Admitting: Family Medicine

## 2020-11-15 VITALS — BP 140/62 | HR 54 | Temp 97.4°F | Ht 65.0 in | Wt 256.5 lb

## 2020-11-15 DIAGNOSIS — Z794 Long term (current) use of insulin: Secondary | ICD-10-CM | POA: Diagnosis not present

## 2020-11-15 DIAGNOSIS — I10 Essential (primary) hypertension: Secondary | ICD-10-CM | POA: Diagnosis not present

## 2020-11-15 DIAGNOSIS — E1169 Type 2 diabetes mellitus with other specified complication: Secondary | ICD-10-CM | POA: Diagnosis not present

## 2020-11-15 DIAGNOSIS — I5032 Chronic diastolic (congestive) heart failure: Secondary | ICD-10-CM

## 2020-11-15 DIAGNOSIS — I350 Nonrheumatic aortic (valve) stenosis: Secondary | ICD-10-CM

## 2020-11-15 DIAGNOSIS — E1140 Type 2 diabetes mellitus with diabetic neuropathy, unspecified: Secondary | ICD-10-CM | POA: Diagnosis not present

## 2020-11-15 DIAGNOSIS — E785 Hyperlipidemia, unspecified: Secondary | ICD-10-CM

## 2020-11-15 DIAGNOSIS — I6523 Occlusion and stenosis of bilateral carotid arteries: Secondary | ICD-10-CM | POA: Diagnosis not present

## 2020-11-15 DIAGNOSIS — R55 Syncope and collapse: Secondary | ICD-10-CM

## 2020-11-15 DIAGNOSIS — I214 Non-ST elevation (NSTEMI) myocardial infarction: Secondary | ICD-10-CM

## 2020-11-15 DIAGNOSIS — E1122 Type 2 diabetes mellitus with diabetic chronic kidney disease: Secondary | ICD-10-CM

## 2020-11-15 DIAGNOSIS — N183 Chronic kidney disease, stage 3 unspecified: Secondary | ICD-10-CM

## 2020-11-15 LAB — RENAL FUNCTION PANEL
Albumin: 3.9 g/dL (ref 3.5–5.2)
BUN: 35 mg/dL — ABNORMAL HIGH (ref 6–23)
CO2: 32 mEq/L (ref 19–32)
Calcium: 9.2 mg/dL (ref 8.4–10.5)
Chloride: 101 mEq/L (ref 96–112)
Creatinine, Ser: 1.68 mg/dL — ABNORMAL HIGH (ref 0.40–1.50)
GFR: 40.24 mL/min — ABNORMAL LOW (ref >60.00)
Glucose, Bld: 225 mg/dL — ABNORMAL HIGH (ref 70–99)
Phosphorus: 3.5 mg/dL (ref 2.3–4.6)
Potassium: 4.9 mEq/L (ref 3.5–5.1)
Sodium: 140 mEq/L (ref 135–145)

## 2020-11-15 NOTE — Patient Instructions (Addendum)
Continue current medicines  Labs today  If you do need steroid injection, check with cardiology about timing off eliquis.  Reschedule next month's appointment to January.

## 2020-11-15 NOTE — Assessment & Plan Note (Signed)
BP stable with recent med changes - off coreg and now on isosorbide 30mg  daily.

## 2020-11-15 NOTE — Progress Notes (Signed)
Patient ID: Donald [name], male    DOB: [birth year], 75 y.o.   MRN: [id number]  This visit was conducted in person.  BP 140/62   Pulse (!) 54   Temp (!) 97.4 F (36.3 C) (Temporal)   Ht 5\' 5"  (1.651 m)   Wt 256 lb 8 oz (116.3 kg)   SpO2 94%   BMI 42.68 kg/m    CC: hospital f/u visit  Subjective:   HPI: Donald [name] is a 75 y.o. male presenting on 11/15/2020 for Hospitalization Follow-up (Admitted on 10/30/20 to [facility], dx syncope. )   Wife Donald [name] drove him here today.   Recent hospitalization for syncope - was unconscious for about 20 min on the garage floor, doesn't remember what happened. Lay on floor for about an hour trying to get up, called wife then EMS. Cbg on check 68. Syncopal workup reassuring including echocardiogram, brain MRI, carotid US. Discharged with zio patch through cardiology. Found to have NSTEMI, cardiac catheterization showed mild nonobstructive CAD with mild-mod AS. Echo showed EF 45-50%, global hypokinesis, G3DD. Carotid US with <50% stenosis bilaterally. Also found to have new onset atrial fibrillation, started on eliquis and coreg was discontinued due to bradycardia. Isosorbide was started in hospitalization.  Hospital records reviewed. Med rec performed.  Found to have low sugar and low potassium.   Home health not recommended Other follow up appointments: saw Dr Rockey Situ earlier this week.  Currently still has Zio patch in place.  Told to stop aspirin as he's now on eliquis.   He is currently taking lasix 80mg  daily as well as potassium 71mEq BID  He also saw endocrinology (Dr Loanne Drilling) for insulin dependent T2DM - planning ot start continuous glucose monitor. Was not able to afford Mounjaro.  ______________________________________________________________________ Admit date: 10/30/2020 Discharge date: 11/02/2020 TCM f/u phone call: not performed   Admitted From: home  Disposition:  home   Discharge Diagnoses:  Principal Problem:    Syncope and collapse Active Problems:   Iron deficiency anemia   Essential hypertension   History of pulmonary embolus (PE)   GERD   OSA (obstructive sleep apnea)   CAD (coronary artery disease)   CKD stage 3 due to type 2 diabetes mellitus (HCC)   Type 2 diabetes mellitus with diabetic chronic kidney disease (HCC)   Non-ST elevation (NSTEMI) myocardial infarction ([place])   Cardiomyopathy ([place])   Recommendations for Outpatient Follow-up:  Follow up with PCP in 1-2 weeks F/u w/ cardio, Dr. Rockey Situ, in 1-2 weeks  Will need a BMP w/in 1 week to check Cr    Home Health: Equipment/Devices:   Discharge Condition: stable  CODE STATUS: full  Diet recommendation: Heart Healthy / Carb Modified     Relevant past medical, surgical, family and social history reviewed and updated as indicated. Interim medical history since our last visit reviewed. Allergies and medications reviewed and updated. Outpatient Medications Prior to Visit  Medication Sig Dispense Refill   albuterol (VENTOLIN HFA) 108 (90 Base) MCG/ACT inhaler Inhale 2 puffs into the lungs every 6 (six) hours as needed for wheezing or shortness of breath. 8 g 0   allopurinol (ZYLOPRIM) 300 MG tablet Take 1 tablet (300 mg total) by mouth daily. 90 tablet 3   ALPRAZolam (XANAX) 1 MG tablet TAKE 1 TABLET BY MOUTH AT BEDTIME IF NEEDED FOR SLEEP 30 tablet 3   apixaban (ELIQUIS) 5 MG TABS tablet Take 1 tablet (5 mg total) by mouth 2 (two) times daily. 180 tablet 3  Cholecalciferol (VITAMIN D3) 25 MCG (1000 UT) CAPS Take 1 capsule (1,000 Units total) by mouth daily. 30 capsule    colchicine (COLCRYS) 0.6 MG tablet Take 1 tablet (0.6 mg total) by mouth daily as needed (gout flare). On first day of gout flare may take 2 tablets at once. 30 tablet 4   cyanocobalamin (,VITAMIN B-12,) 1000 MCG/ML injection Inject 1 mL (1,000 mcg total) into the muscle every 30 (thirty) days. 1 mL 11   cyclobenzaprine (FLEXERIL) 10 MG tablet Take 10 mg by mouth 3  (three) times daily as needed for muscle spasms.     doxazosin (CARDURA) 1 MG tablet Take 1 tablet (1 mg total) by mouth 2 (two) times daily. 180 tablet 3   fluticasone (FLONASE) 50 MCG/ACT nasal spray Place 2 sprays into both nostrils daily. 16 g 1   furosemide (LASIX) 80 MG tablet TAKE ONE TABLET (80 MG) BY MOUTH EVERY DAY take an extra lasix for swelling 100 tablet 3   gabapentin (NEURONTIN) 600 MG tablet TAKE TWO TABLETS AT BEDTIME 180 tablet 3   glucose blood (GE100 BLOOD GLUCOSE TEST) test strip 1 each by Other route 4 (four) times daily. E11.9 360 each 0   HYDROcodone-acetaminophen (NORCO) 10-325 MG tablet Take 1 tablet by mouth every 6 (six) hours as needed for moderate pain ((typically once to twice daily if needed)).  30 tablet 0   insulin NPH Human (HUMULIN N) 100 UNIT/ML injection Inject 2.3 mLs (230 Units total) into the skin at bedtime. 90 mL 5   insulin regular (NOVOLIN R RELION) 100 units/mL injection 3 times a day (just before each meal), 200-140-280 units, and syringes 4/day. 210 mL 0   isosorbide mononitrate (IMDUR) 30 MG 24 hr tablet Take 1 tablet (30 mg total) by mouth daily. 90 tablet 3   losartan (COZAAR) 25 MG tablet Take 1 tablet (25 mg total) by mouth at bedtime. 90 tablet 3   lovastatin (MEVACOR) 40 MG tablet Take 1 tablet (40 mg total) by mouth at bedtime. 90 tablet 3   Naphazoline-Pheniramine (OPCON-A OP) Place 1 drop into both eyes 3 (three) times daily as needed (itchy eyes.).      ondansetron (ZOFRAN) 4 MG tablet Take 1 tablet (4 mg total) by mouth every 4 (four) hours as needed for nausea. 10 tablet 0   potassium chloride SA (KLOR-CON) 20 MEQ tablet Take 2 tablets (40 mEq total) by mouth daily. 180 tablet 3   Sennosides-Docusate Sodium (STOOL SOFTENER & LAXATIVE PO) Take 2 tablets by mouth at bedtime.     Syringe/Needle, Disp, (SYRINGE 3CC/22GX1-1/2") 22G X 1-1/2" 3 ML MISC Use to administer monthly b12 shots 50 each 0   Continuous Blood Gluc Sensor (FREESTYLE LIBRE  3 SENSOR) MISC 1 Device by Does not apply route every 14 (fourteen) days. Place 1 sensor on the skin every 14 days. Use to check glucose continuously (Patient not taking: Reported on 11/15/2020) 6 each 3   tirzepatide (MOUNJARO) 2.5 MG/0.5ML Pen Inject 2.5 mg into the skin once a week. 6 mL 3   No facility-administered medications prior to visit.     Per HPI unless specifically indicated in ROS section below Review of Systems  Objective:  BP 140/62   Pulse (!) 54   Temp (!) 97.4 F (36.3 C) (Temporal)   Ht 5\' 5"  (1.651 m)   Wt 256 lb 8 oz (116.3 kg)   SpO2 94%   BMI 42.68 kg/m   Wt Readings from Last 3 Encounters:  11/15/20 256 lb 8 oz (116.3 kg)  11/13/20 255 lb 8 oz (115.9 kg)  11/09/20 254 lb (115.2 kg)      Physical Exam Vitals and nursing note reviewed.  Constitutional:      Appearance: Normal appearance. He is obese. He is not ill-appearing.  Cardiovascular:     Rate and Rhythm: Normal rate and regular rhythm.     Pulses: Normal pulses.     Heart sounds: Murmur (3/6 systolic USB) heard.     Comments: Known AS by echo/cath Pulmonary:     Effort: Pulmonary effort is normal. No respiratory distress.     Breath sounds: Normal breath sounds. No wheezing or rhonchi.  Musculoskeletal:     Right lower leg: No edema.     Left lower leg: No edema.  Skin:    General: Skin is warm and dry.     Findings: No rash.  Neurological:     Mental Status: He is alert.  Psychiatric:        Mood and Affect: Mood normal.        Behavior: Behavior normal.      Results for orders placed or performed in visit on 11/09/20  POCT glycosylated hemoglobin (Hb A1C)  Result Value Ref Range   Hemoglobin A1C 7.3 (A) 4.0 - 5.6 %   HbA1c POC (<> result, manual entry)     HbA1c, POC (prediabetic range)     HbA1c, POC (controlled diabetic range)      Assessment & Plan:  This visit occurred during the SARS-CoV-2 public health emergency.  Safety protocols were in place, including screening  questions prior to the visit, additional usage of staff PPE, and extensive cleaning of exam room while observing appropriate contact time as indicated for disinfecting solutions.   Problem List Items Addressed This Visit     Dyslipidemia associated with type 2 diabetes mellitus ([place])    Continue lovastatin.  The ASCVD Risk score (Arnett DK, et al., 2019) failed to calculate for the following reasons:   The patient has a prior MI or stroke diagnosis       Essential hypertension    BP stable with recent med changes - off coreg and now on isosorbide 30mg  daily.       CKD stage 3 due to type 2 diabetes mellitus (HCC)    Update renal panel.       Type 2 diabetes mellitus with diabetic neuropathy, unspecified (HCC)    Chronic, appreciate endo care. Planning to start CGM. Darcel Bayley was too expensive.       Aortic stenosis    Known aortic stenosis but catheterization/echocardiogram.       Heart failure with preserved ejection fraction (HCC)    Continue lasix 80mg  daily with potassium 32mEq BID. Update renal panel today.       Syncope and collapse - Primary    Episode at home of syncope s/p unrevealing comprehensive evaluation during latest hospitalization. Zio patch results pending. Appreciate cards care. Has been recommended against driving while zio patch results are pending.       Relevant Orders   Renal function panel   Non-ST elevation (NSTEMI) myocardial infarction [facility])    Discussed with patient. Continue isosorbide new commencement as well as eliquis and statin.         No orders of the defined types were placed in this encounter.  Orders Placed This Encounter  Procedures   Renal function panel    Patient Instructions  Continue current medicines  Labs today  If you do need steroid injection, check with cardiology about timing off eliquis.  Reschedule next month's appointment to January.   Follow up plan: Return in about 2 months (around 01/15/2021), or if symptoms  worsen or fail to improve, for follow up visit.  Ria Bush, MD

## 2020-11-15 NOTE — Assessment & Plan Note (Signed)
Continue lovastatin.  The ASCVD Risk score (Arnett DK, et al., 2019) failed to calculate for the following reasons:   The patient has a prior MI or stroke diagnosis

## 2020-11-15 NOTE — Assessment & Plan Note (Signed)
Episode at home of syncope s/p unrevealing comprehensive evaluation during latest hospitalization. Zio patch results pending. Appreciate cards care. Has been recommended against driving while zio patch results are pending.

## 2020-11-15 NOTE — Assessment & Plan Note (Signed)
Update renal panel 

## 2020-11-15 NOTE — Assessment & Plan Note (Signed)
Known aortic stenosis but catheterization/echocardiogram.

## 2020-11-15 NOTE — Assessment & Plan Note (Signed)
Continue lasix 80mg  daily with potassium 56mEq BID. Update renal panel today.

## 2020-11-15 NOTE — Assessment & Plan Note (Signed)
Discussed with patient. Continue isosorbide new commencement as well as eliquis and statin.

## 2020-11-15 NOTE — Assessment & Plan Note (Signed)
Chronic, appreciate endo care. Planning to start CGM. Darcel Bayley was too expensive.

## 2020-11-23 ENCOUNTER — Telehealth: Payer: Self-pay | Admitting: Cardiovascular Disease

## 2020-11-23 NOTE — Telephone Encounter (Signed)
Patient calling Would like to discuss why he would need Eliquis if no longer in afib and if there may be other options since it is so expensive  Please call to discuss

## 2020-11-24 NOTE — Telephone Encounter (Signed)
Was able to reach back out to Donald [name] to f/u on his Eliquis cost and PA application. Educated pt importance of staying on Eliquis. PCP notes from Dr. Jeral Fruit 10/20 (ED-Admission)  A. fib: new onset. Possibly PAF. Started on eliquis as per cardio. Coreg was d/c as per cardio    Also noted from pt's PCP Also found to have new onset atrial fibrillation, started on eliquis and coreg was discontinued due to bradycardia. Isosorbide was started in hospitalization.   Pt verbalized understanding, pt worried about cost, advised to fill out PA application from last OV and can call the [phone] number given at last OV as well as they can provided some assistance to what may be able to assist with cost and/or coverage. Pt verbalized understanding and and is thankful for call, will call Eliquis and complete form and bring back to office.

## 2020-11-30 ENCOUNTER — Other Ambulatory Visit: Payer: Self-pay

## 2020-11-30 ENCOUNTER — Ambulatory Visit (INDEPENDENT_AMBULATORY_CARE_PROVIDER_SITE_OTHER): Payer: Medicare Other | Admitting: Podiatry

## 2020-11-30 DIAGNOSIS — M778 Other enthesopathies, not elsewhere classified: Secondary | ICD-10-CM | POA: Diagnosis not present

## 2020-12-05 ENCOUNTER — Encounter: Payer: Self-pay | Admitting: Podiatry

## 2020-12-05 NOTE — Progress Notes (Signed)
   HPI: 75 y.o. male presenting today for follow-up evaluation of left foot pain.  PMHx uncontrolled diabetes type 2 with peripheral polyneuropathy, chronic CKD.  Patient is a the first injection helped pretty well.  He states that he would like to do another injection.  He states he is about 80% improved.  He denies any other acute complaints.  Past Medical History:  Diagnosis Date   (HFpEF) heart failure with preserved ejection fraction ([place])    a. 09/2018 Echo: EF 60-65%. PASP 64mmHg. Mild-mod LAE. Mild MR/TR.   ANEMIA-IRON DEFICIENCY 07/26/2006   Anxiety    ASTHMA 07/26/2006   Asthma    Back pain, chronic    Bell's palsy    CKD (chronic kidney disease), stage III ([place])    COLONIC POLYPS, HX OF 07/26/2006   COVID-19 virus infection 01/2020   DEPRESSION 03/14/2009   DIABETES MELLITUS, TYPE II 07/26/2006   [place] DISEASE, LUMBAR 10/05/2007   DVT 12/03/2007   DYSLIPIDEMIA 04/13/2009   Dyspnea    when gets up and walks around and back is hurting really bad -only Shortness of breath  then   GERD 07/26/2006   Gout    Heart murmur    History of kidney stones    HYPERTENSION 07/26/2006   INSOMNIA 08/21/2007   Neuropathy    Nonobstructive CAD (coronary artery disease)    a. 2012 Cath: no high grade stenosis; b. 2018 MV: No ischemia. Attenuation artifact.    OBSTRUCTIVE SLEEP APNEA 12/03/2007   Use C-PAP   PERIPHERAL NEUROPATHY 07/26/2006   Pernicious anemia 11/20/2006   PULMONARY EMBOLISM 10/05/2007     Physical Exam: General: The patient is alert and oriented x3 in no acute distress.  Dermatology: Skin is warm, dry and supple bilateral lower extremities. Negative for open lesions or macerations.  Vascular: Palpable pedal pulses bilaterally. No edema or erythema noted. Capillary refill within normal limits.  Neurological: Epicritic and protective threshold grossly intact bilaterally.   Musculoskeletal Exam: Range of motion within normal limits to all pedal and ankle joints bilateral. Muscle  strength 5/5 in all groups bilateral.  There is some pain on palpation noted to the lateral aspect of the left foot at the tarsometatarsal joint, specifically the fifth metatarsal cuboid joint.  This correlates with the degenerative changes noted on radiographic exam.  The pain does not correlate to the area of the suspected aneurysmal bone cyst.  Radiographic Exam:  Normal osseous mineralization.  There are some joint space narrowing and articular degenerative changes of the fifth metatarsal cuboid joint.  There is a cortical irregularity along the joint.  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  It appears well-circumscribed and chronic in nature.  This does not correlate to the patient's symptoms.  No pathologic fracture identified.  There may be some chronic old pathologic fractures in the past that have healed.  Assessment: 1.  DJD/capsulitis tarsometatarsal joint lateral left foot 2.  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 Patient evaluated. X-Rays reviewed.  2.  Second injection of 0.5 cc Celestone Soluspan injection to the lateral aspect of the TMT joint left foot 3.  No NSAIDs prescribed due to CKD 4.  No prescriptions provided today.  Patient takes allopurinol, colchicine, and gabapentin daily from his PCP 5.  Return to clinic 6 weeks for possible follow-up injection

## 2020-12-18 ENCOUNTER — Ambulatory Visit: Payer: Medicare Other | Admitting: Endocrinology

## 2020-12-22 ENCOUNTER — Ambulatory Visit: Payer: Medicare Other | Admitting: Family Medicine

## 2020-12-26 ENCOUNTER — Other Ambulatory Visit: Payer: Self-pay | Admitting: Endocrinology

## 2020-12-26 DIAGNOSIS — E119 Type 2 diabetes mellitus without complications: Secondary | ICD-10-CM

## 2020-12-29 DIAGNOSIS — M25562 Pain in left knee: Secondary | ICD-10-CM | POA: Diagnosis not present

## 2020-12-29 DIAGNOSIS — M1711 Unilateral primary osteoarthritis, right knee: Secondary | ICD-10-CM | POA: Diagnosis not present

## 2021-01-06 DIAGNOSIS — Z23 Encounter for immunization: Secondary | ICD-10-CM | POA: Diagnosis not present

## 2021-01-12 ENCOUNTER — Encounter: Payer: Self-pay | Admitting: Family

## 2021-01-12 ENCOUNTER — Ambulatory Visit (INDEPENDENT_AMBULATORY_CARE_PROVIDER_SITE_OTHER): Payer: Medicare Other | Admitting: Family

## 2021-01-12 VITALS — BP 130/60 | Temp 97.6°F | Ht 65.0 in | Wt 250.0 lb

## 2021-01-12 DIAGNOSIS — I6523 Occlusion and stenosis of bilateral carotid arteries: Secondary | ICD-10-CM

## 2021-01-12 DIAGNOSIS — R5383 Other fatigue: Secondary | ICD-10-CM

## 2021-01-12 DIAGNOSIS — U071 COVID-19: Secondary | ICD-10-CM

## 2021-01-12 MED ORDER — PROMETHAZINE-DM 6.25-15 MG/5ML PO SYRP: 5.0000 mL | ORAL_SOLUTION | Freq: Four times a day (QID) | ORAL | 0 refills | Status: DC | PRN

## 2021-01-12 MED ORDER — MOLNUPIRAVIR EUA 200MG CAPSULE: 4.0000 | ORAL_CAPSULE | Freq: Two times a day (BID) | ORAL | 0 refills | Status: AC

## 2021-01-12 NOTE — Progress Notes (Signed)
Virtual Visit via Telephone Note  I connected with Donald [name] on 01/12/21 at  2:20 PM EST by telephone and verified that I am speaking with the correct person using two identifiers.  Location: Patient: home Provider: Selinda Flavin   I discussed the limitations, risks, security and privacy concerns of performing an evaluation and management service by telephone and the availability of in person appointments. I also discussed with the patient that there may be a patient responsible charge related to this service. The patient expressed understanding and agreed to proceed.   History of Present Illness: 75 year old male presents via telephone visit with concerns of nasal congestion, headaches, fever and chills x 3 days. Wife is positive for COVID-19. Has also tested positive for COVID. Has not taken any OTC medications yet.    Observations/Objective: A&O, NAD   Assessment and Plan: Donald [name] was seen today for acute visit.  Diagnoses and all orders for this visit:  COVID-19  Other fatigue  Other orders -     molnupiravir EUA (LAGEVRIO) 200 mg CAPS capsule; Take 4 capsules (800 mg total) by mouth 2 (two) times daily for 5 days. -     promethazine-dextromethorphan (PROMETHAZINE-DM) 6.25-15 MG/5ML syrup; Take 5 mLs by mouth 4 (four) times daily as needed.   Call the office if symptoms worsen and persist. Recheck as scheduled and sooner as needed. COVID-19 precautions provided.    Follow Up Instructions:    I discussed the assessment and treatment plan with the patient. The patient was provided an opportunity to ask questions and all were answered. The patient agreed with the plan and demonstrated an understanding of the instructions.   The patient was advised to call back or seek an in-person evaluation if the symptoms worsen or if the condition fails to improve as anticipated.  I provided 20 minutes of non-face-to-face time during this encounter.   Kennyth Arnold, FNP

## 2021-01-17 ENCOUNTER — Ambulatory Visit: Payer: Medicare Other | Admitting: Family Medicine

## 2021-01-22 ENCOUNTER — Other Ambulatory Visit: Payer: Self-pay | Admitting: Endocrinology

## 2021-01-22 ENCOUNTER — Other Ambulatory Visit: Payer: Self-pay

## 2021-01-22 ENCOUNTER — Telehealth: Payer: Self-pay | Admitting: Endocrinology

## 2021-01-22 DIAGNOSIS — E119 Type 2 diabetes mellitus without complications: Secondary | ICD-10-CM

## 2021-01-22 DIAGNOSIS — E1122 Type 2 diabetes mellitus with diabetic chronic kidney disease: Secondary | ICD-10-CM

## 2021-01-22 MED ORDER — INSULIN NPH (HUMAN) (ISOPHANE) 100 UNIT/ML ~~LOC~~ SUSP: 230.0000 [IU] | Freq: Every day | SUBCUTANEOUS | 5 refills | Status: DC

## 2021-01-22 NOTE — Telephone Encounter (Signed)
Rx sent 

## 2021-01-22 NOTE — Telephone Encounter (Signed)
MEDICATION: insulin NPH Human (HUMULIN N) 100 UNIT/ML injection insulin regular (NOVOLIN R RELION) 100 units/mL injection  PHARMACY:   [facility] [place], [place] Phone:  [phone]  Fax:  [phone]      HAS THE PATIENT CONTACTED [facility]?  YES  IS THIS A 90 DAY SUPPLY : 30 DAYS  IS PATIENT OUT OF MEDICATION: 1 DAY   IF NOT; HOW MUCH IS LEFT:   LAST APPOINTMENT DATE: @12 /13/2022  NEXT APPOINTMENT DATE:@2 /27/2023  DO WE HAVE YOUR PERMISSION TO LEAVE A DETAILED MESSAGE?:  OTHER COMMENTS: Patient stated that pharmacy gave him Novolin N NOT Humulin N when prescription was picked up and that is what he has been using. Are they the same? Please provide confirmation when refill is sent [phone].   **Let patient know to contact pharmacy at the end of the day to make sure medication is ready. **  ** Please notify patient to allow 48-72 hours to process**  **Encourage patient to contact the pharmacy for refills or they can request refills through [facility]**

## 2021-01-23 DIAGNOSIS — N189 Chronic kidney disease, unspecified: Secondary | ICD-10-CM | POA: Diagnosis not present

## 2021-01-23 DIAGNOSIS — N1831 Chronic kidney disease, stage 3a: Secondary | ICD-10-CM | POA: Diagnosis not present

## 2021-01-24 ENCOUNTER — Encounter: Payer: Self-pay | Admitting: Family Medicine

## 2021-01-24 ENCOUNTER — Other Ambulatory Visit: Payer: Self-pay

## 2021-01-24 ENCOUNTER — Ambulatory Visit (INDEPENDENT_AMBULATORY_CARE_PROVIDER_SITE_OTHER): Payer: Medicare Other | Admitting: Family Medicine

## 2021-01-24 VITALS — BP 138/66 | HR 53 | Temp 97.7°F | Ht 65.0 in | Wt 252.0 lb

## 2021-01-24 DIAGNOSIS — E1140 Type 2 diabetes mellitus with diabetic neuropathy, unspecified: Secondary | ICD-10-CM

## 2021-01-24 DIAGNOSIS — N183 Chronic kidney disease, stage 3 unspecified: Secondary | ICD-10-CM | POA: Diagnosis not present

## 2021-01-24 DIAGNOSIS — R55 Syncope and collapse: Secondary | ICD-10-CM | POA: Diagnosis not present

## 2021-01-24 DIAGNOSIS — E1122 Type 2 diabetes mellitus with diabetic chronic kidney disease: Secondary | ICD-10-CM | POA: Diagnosis not present

## 2021-01-24 DIAGNOSIS — I5032 Chronic diastolic (congestive) heart failure: Secondary | ICD-10-CM

## 2021-01-24 DIAGNOSIS — N1832 Chronic kidney disease, stage 3b: Secondary | ICD-10-CM | POA: Diagnosis not present

## 2021-01-24 DIAGNOSIS — I480 Paroxysmal atrial fibrillation: Secondary | ICD-10-CM | POA: Diagnosis not present

## 2021-01-24 DIAGNOSIS — Z794 Long term (current) use of insulin: Secondary | ICD-10-CM | POA: Diagnosis not present

## 2021-01-24 DIAGNOSIS — I10 Essential (primary) hypertension: Secondary | ICD-10-CM

## 2021-01-24 DIAGNOSIS — E1142 Type 2 diabetes mellitus with diabetic polyneuropathy: Secondary | ICD-10-CM | POA: Diagnosis not present

## 2021-01-24 DIAGNOSIS — U071 COVID-19: Secondary | ICD-10-CM | POA: Diagnosis not present

## 2021-01-24 MED ORDER — POTASSIUM CHLORIDE CRYS ER 10 MEQ PO TBCR: 20.0000 meq | EXTENDED_RELEASE_TABLET | Freq: Two times a day (BID) | ORAL | 1 refills | Status: DC

## 2021-01-24 NOTE — Progress Notes (Signed)
Patient ID: Donald [name], male    DOB: Feb 28, 1945, 76 y.o.   MRN: [id number]  This visit was conducted in person.  BP 138/66    Pulse (!) 53    Temp 97.7 F (36.5 C) (Temporal)    Ht 5\' 5"  (1.651 m)    Wt 252 lb (114.3 kg)    SpO2 96%    BMI 41.93 kg/m    CC: 2 mo f/u visit  Subjective:   HPI: Donald [name] is a 76 y.o. male presenting on 01/24/2021 for Follow-up (Here for 2 mo f/u.)   Recent COVID infection late last month treated with molnupiravir. Symptoms are significantly improved. This was second COVID infection.   Recent hospitalization for syncope 11/2020 - NSTEMI with new onset afib, started on eliquis and imdur. Worsening back pain - he would want steroid injection into the back but knowns this is on hold while on eliquis.   Continues endo f/u for T2DM on high dose insulin. Darcel Bayley was unaffordable. Still waiting for CGM through endo - Rx was sent in 10/2020. Has had more fluctuating sugars since knee steroid injection. Notes some low sugar readings recently.   No chest pain, syncope or pre syncope.  He had labwork done yesterday for upcoming appt with Dr Marval Regal - needs to call and schedule appointment.  Also needs to call to schedule f/u appt with Dr Rockey Situ   Having trouble swallowing potassium 9mEQ dose - takes 1 tab BID.      Relevant past medical, surgical, family and social history reviewed and updated as indicated. Interim medical history since our last visit reviewed. Allergies and medications reviewed and updated. Outpatient Medications Prior to Visit  Medication Sig Dispense Refill   albuterol (VENTOLIN HFA) 108 (90 Base) MCG/ACT inhaler Inhale 2 puffs into the lungs every 6 (six) hours as needed for wheezing or shortness of breath. 8 g 0   allopurinol (ZYLOPRIM) 300 MG tablet Take 1 tablet (300 mg total) by mouth daily. 90 tablet 3   ALPRAZolam (XANAX) 1 MG tablet TAKE 1 TABLET BY MOUTH AT BEDTIME IF NEEDED FOR SLEEP 30 tablet 3   apixaban  (ELIQUIS) 5 MG TABS tablet Take 1 tablet (5 mg total) by mouth 2 (two) times daily. 180 tablet 3   Cholecalciferol (VITAMIN D3) 25 MCG (1000 UT) CAPS Take 1 capsule (1,000 Units total) by mouth daily. 30 capsule    colchicine (COLCRYS) 0.6 MG tablet Take 1 tablet (0.6 mg total) by mouth daily as needed (gout flare). On first day of gout flare may take 2 tablets at once. 30 tablet 4   cyanocobalamin (,VITAMIN B-12,) 1000 MCG/ML injection Inject 1 mL (1,000 mcg total) into the muscle every 30 (thirty) days. 1 mL 11   cyclobenzaprine (FLEXERIL) 10 MG tablet Take 10 mg by mouth 3 (three) times daily as needed for muscle spasms.     doxazosin (CARDURA) 1 MG tablet Take 1 tablet (1 mg total) by mouth 2 (two) times daily. 180 tablet 3   fluticasone (FLONASE) 50 MCG/ACT nasal spray Place 2 sprays into both nostrils daily. 16 g 1   furosemide (LASIX) 80 MG tablet TAKE ONE TABLET (80 MG) BY MOUTH EVERY DAY take an extra lasix for swelling 100 tablet 3   gabapentin (NEURONTIN) 600 MG tablet TAKE TWO TABLETS AT BEDTIME 180 tablet 3   GE100 BLOOD GLUCOSE TEST test strip TEST FOUR TIMES DAILY 200 each 1   HUMULIN N 100 UNIT/ML injection INJECT 230  UNITS TOTAL INTO THE SKIN AT BEDTIME 90 mL 5   HYDROcodone-acetaminophen (NORCO) 10-325 MG tablet Take 1 tablet by mouth every 6 (six) hours as needed for moderate pain ((typically once to twice daily if needed)).  30 tablet 0   isosorbide mononitrate (IMDUR) 30 MG 24 hr tablet Take 1 tablet (30 mg total) by mouth daily. 90 tablet 3   losartan (COZAAR) 25 MG tablet Take 1 tablet (25 mg total) by mouth at bedtime. 90 tablet 3   lovastatin (MEVACOR) 40 MG tablet Take 1 tablet (40 mg total) by mouth at bedtime. 90 tablet 3   Naphazoline-Pheniramine (OPCON-A OP) Place 1 drop into both eyes 3 (three) times daily as needed (itchy eyes.).      ondansetron (ZOFRAN) 4 MG tablet Take 1 tablet (4 mg total) by mouth every 4 (four) hours as needed for nausea. 10 tablet 0    promethazine-dextromethorphan (PROMETHAZINE-DM) 6.25-15 MG/5ML syrup Take 5 mLs by mouth 4 (four) times daily as needed. 118 mL 0   Sennosides-Docusate Sodium (STOOL SOFTENER & LAXATIVE PO) Take 2 tablets by mouth at bedtime.     Syringe/Needle, Disp, (SYRINGE 3CC/22GX1-1/2") 22G X 1-1/2" 3 ML MISC Use to administer monthly b12 shots 50 each 0   insulin regular (NOVOLIN R RELION) 100 units/mL injection 3 times a day (just before each meal), 200-140-280 units, and syringes 4/day. 210 mL 0   Continuous Blood Gluc Sensor (FREESTYLE LIBRE 3 SENSOR) MISC 1 Device by Does not apply route every 14 (fourteen) days. Place 1 sensor on the skin every 14 days. Use to check glucose continuously (Patient not taking: Reported on 11/15/2020) 6 each 3   potassium chloride SA (KLOR-CON) 20 MEQ tablet Take 2 tablets (40 mEq total) by mouth daily. 180 tablet 3   No facility-administered medications prior to visit.     Per HPI unless specifically indicated in ROS section below Review of Systems  Objective:  BP 138/66    Pulse (!) 53    Temp 97.7 F (36.5 C) (Temporal)    Ht 5\' 5"  (1.651 m)    Wt 252 lb (114.3 kg)    SpO2 96%    BMI 41.93 kg/m   Wt Readings from Last 3 Encounters:  01/24/21 252 lb (114.3 kg)  01/12/21 250 lb (113.4 kg)  11/15/20 256 lb 8 oz (116.3 kg)      Physical Exam Vitals and nursing note reviewed.  Constitutional:      Appearance: Normal appearance. He is obese. He is not ill-appearing.     Comments: Ambulates with cane  Cardiovascular:     Rate and Rhythm: Normal rate and regular rhythm.     Pulses: Normal pulses.     Heart sounds: Murmur (3/6 systolic at USB) heard.  Pulmonary:     Effort: Pulmonary effort is normal. No respiratory distress.     Breath sounds: Normal breath sounds. No wheezing, rhonchi or rales.  Musculoskeletal:     Right lower leg: No edema.     Left lower leg: No edema.  Skin:    General: Skin is warm and dry.     Findings: No rash.  Neurological:      Mental Status: He is alert.  Psychiatric:        Mood and Affect: Mood normal.        Behavior: Behavior normal.      Results for orders placed or performed in visit on 11/15/20  Renal function panel  Result Value Ref Range  Sodium 140 135 - 145 mEq/L   Potassium 4.9 3.5 - 5.1 mEq/L   Chloride 101 96 - 112 mEq/L   CO2 32 19 - 32 mEq/L   Albumin 3.9 3.5 - 5.2 g/dL   BUN 35 (H) 6 - 23 mg/dL   Creatinine, Ser 1.68 (H) 0.40 - 1.50 mg/dL   Glucose, Bld 225 (H) 70 - 99 mg/dL   Phosphorus 3.5 2.3 - 4.6 mg/dL   GFR 40.24 (L) >60.00 mL/min   Calcium 9.2 8.4 - 10.5 mg/dL   Lab Results  Component Value Date   HGBA1C 7.3 (A) 11/09/2020    Assessment & Plan:  This visit occurred during the SARS-CoV-2 public health emergency.  Safety protocols were in place, including screening questions prior to the visit, additional usage of staff PPE, and extensive cleaning of exam room while observing appropriate contact time as indicated for disinfecting solutions.   Problem List Items Addressed This Visit     Essential hypertension    BP well controlled on current regimen of isosorbide, losartan 25mg  daily, lasix 80mg  daily with extra tablet PRN pedal edema.  Carvedilol recently stopped due to bradycardia.       Obesity, morbid, BMI 40.0-49.9 ([place])    Continues working towards weight loss - with healthy diet changes (mainly decreasing portion sizes)      CKD stage 3 due to type 2 diabetes mellitus ([place])    He recently completed renal labs for Dr Marval Regal - I asked for a copy of these.       Type 2 diabetes mellitus with diabetic neuropathy, unspecified (HCC)    Chronic, followed by Dr Loanne Drilling, appreciate endo care.  Needs insulin refill, still waiting for CGM, and has had recent marked hypoglycemia - I asked him to call endo today to follow up on these three issues.       Type 2 diabetes mellitus with diabetic chronic kidney disease ([place])   Heart failure with preserved ejection fraction  (HCC)    Seems euvolemic.  Continue lasix 80mg  daily with potassium 31mEq BID - but notes difficulty swallowing large potassium pills. Will drop dose to 48mEq to take 2 tab BID, update with effect.       Diabetic peripheral neuropathy (HCC)    Continue gabapentin 1200mg  nightly.       Syncope and collapse    No further episodes.  zio patch from 11/2020 with reassuring results.       COVID-19 virus infection    Significant improvement after antiviral treatment.       Paroxysmal atrial fibrillation ([place]) - Primary    Now on eliquis.  Recent zio patch without afib.  Has cardiology f/u planned 05/2021.         Meds ordered this encounter  Medications   potassium chloride SA (KLOR-CON M) 10 MEQ tablet    Sig: Take 2 tablets (20 mEq total) by mouth 2 (two) times daily.    Dispense:  120 tablet    Refill:  1   No orders of the defined types were placed in this encounter.    Patient Instructions  Good to see you today  Try to get [name] a copy of your recent labwork.  Touch base today with endocrinology about recent low blood sugars and for update on continuous glucose monitor prescription.  Return for wellness visit in 5 months  Follow up plan: Return in about 5 months (around 06/24/2021), or if symptoms worsen or fail to improve, for medicare wellness  visit.  Ria Bush, MD

## 2021-01-24 NOTE — Telephone Encounter (Signed)
[place] tech called to confirm dosage of 230 units.  Call pharmacy at [phone]

## 2021-01-24 NOTE — Telephone Encounter (Signed)
insulin regular (NOVOLIN R RELION) 100 units/mL injection  Patient is still waiting for refill on the above medication.  Patient request the complete [facility] system to be sent to the Pharmacy also. Currently, only the Sensor appears on patient account. Pharmacy is: [facility] [address], [place] Phone:  [phone]  Fax:  [phone]

## 2021-01-24 NOTE — Patient Instructions (Addendum)
Good to see you today  Try to get [name] a copy of your recent labwork.  Touch base today with endocrinology about recent low blood sugars and for update on continuous glucose monitor prescription.  Return for wellness visit in 5 months

## 2021-01-25 ENCOUNTER — Encounter: Payer: Self-pay | Admitting: Family Medicine

## 2021-01-25 ENCOUNTER — Other Ambulatory Visit: Payer: Self-pay | Admitting: Endocrinology

## 2021-01-25 DIAGNOSIS — I480 Paroxysmal atrial fibrillation: Secondary | ICD-10-CM | POA: Insufficient documentation

## 2021-01-25 DIAGNOSIS — U071 COVID-19: Secondary | ICD-10-CM | POA: Insufficient documentation

## 2021-01-25 DIAGNOSIS — E119 Type 2 diabetes mellitus without complications: Secondary | ICD-10-CM

## 2021-01-25 MED ORDER — FREESTYLE LIBRE 3 SENSOR MISC: 1.0000 | 3 refills | Status: DC

## 2021-01-25 MED ORDER — FREESTYLE LIBRE READER DEVI: 0 refills | Status: DC

## 2021-01-25 MED ORDER — INSULIN REGULAR HUMAN 100 UNIT/ML IJ SOLN: INTRAMUSCULAR | 0 refills | Status: DC

## 2021-01-25 NOTE — Assessment & Plan Note (Addendum)
Chronic, followed by Dr Loanne Drilling, appreciate endo care.  Needs insulin refill, still waiting for CGM, and has had recent marked hypoglycemia - I asked him to call endo today to follow up on these three issues.

## 2021-01-25 NOTE — Assessment & Plan Note (Addendum)
No further episodes.  zio patch from 11/2020 with reassuring results.

## 2021-01-25 NOTE — Assessment & Plan Note (Signed)
He recently completed renal labs for Dr Marval Regal - I asked for a copy of these.

## 2021-01-25 NOTE — Assessment & Plan Note (Addendum)
Seems euvolemic.  Continue lasix 80mg  daily with potassium 60mEq BID - but notes difficulty swallowing large potassium pills. Will drop dose to 72mEq to take 2 tab BID, update with effect.

## 2021-01-25 NOTE — Assessment & Plan Note (Addendum)
BP well controlled on current regimen of isosorbide, losartan 25mg  daily, lasix 80mg  daily with extra tablet PRN pedal edema.  Carvedilol recently stopped due to bradycardia.

## 2021-01-25 NOTE — Telephone Encounter (Signed)
Refill for Novolin, Freestyle libre 3 on file as well as reader has now been sent to pharmacy.

## 2021-01-25 NOTE — Assessment & Plan Note (Signed)
Continue gabapentin 1200mg  nightly.

## 2021-01-25 NOTE — Assessment & Plan Note (Signed)
Continues working towards weight loss - with healthy diet changes (mainly decreasing portion sizes)

## 2021-01-25 NOTE — Assessment & Plan Note (Addendum)
Now on eliquis.  Recent zio patch without afib.  Has cardiology f/u planned 05/2021.

## 2021-01-25 NOTE — Assessment & Plan Note (Addendum)
Significant improvement after antiviral treatment.

## 2021-01-26 NOTE — Telephone Encounter (Signed)
Pharmacy informed patient that they do not carry Humulin N they request a new prescription be sent for Novolin N.  Also, insulin regular (NOVOLIN R RELION) 100 units/mL injection show NO PRINT.  Please advise patient when confirmed: [phone]

## 2021-01-29 ENCOUNTER — Telehealth: Payer: Self-pay | Admitting: Endocrinology

## 2021-01-29 ENCOUNTER — Other Ambulatory Visit: Payer: Self-pay | Admitting: Endocrinology

## 2021-01-29 MED ORDER — INSULIN NPH (HUMAN) (ISOPHANE) 100 UNIT/ML ~~LOC~~ SUSP: 230.0000 [IU] | Freq: Every day | SUBCUTANEOUS | 3 refills | Status: DC

## 2021-01-29 NOTE — Telephone Encounter (Signed)
Donald [name] with [facility] requests to be called at ph# [phone] re: Clarification of dosage for RX received for insulin NPH Human (NOVOLIN N) 100 UNIT/ML injection. Donald [name] states instructions for dosage state for Patient to inject 230 units.

## 2021-01-30 NOTE — Telephone Encounter (Signed)
Spoke with Amy at the pharmacy to give her the clarification of the dosage amt for pt.

## 2021-02-01 ENCOUNTER — Other Ambulatory Visit: Payer: Self-pay

## 2021-02-01 ENCOUNTER — Ambulatory Visit (INDEPENDENT_AMBULATORY_CARE_PROVIDER_SITE_OTHER): Payer: Medicare Other | Admitting: Endocrinology

## 2021-02-01 VITALS — BP 120/54 | HR 63 | Ht 65.0 in | Wt 259.6 lb

## 2021-02-01 DIAGNOSIS — N184 Chronic kidney disease, stage 4 (severe): Secondary | ICD-10-CM

## 2021-02-01 DIAGNOSIS — E119 Type 2 diabetes mellitus without complications: Secondary | ICD-10-CM

## 2021-02-01 DIAGNOSIS — E1122 Type 2 diabetes mellitus with diabetic chronic kidney disease: Secondary | ICD-10-CM | POA: Diagnosis not present

## 2021-02-01 DIAGNOSIS — Z794 Long term (current) use of insulin: Secondary | ICD-10-CM

## 2021-02-01 LAB — POCT GLYCOSYLATED HEMOGLOBIN (HGB A1C): Hemoglobin A1C: 7.8 % — AB (ref 4.0–5.6)

## 2021-02-01 MED ORDER — INSULIN REGULAR HUMAN 100 UNIT/ML IJ SOLN: INTRAMUSCULAR | 0 refills | Status: DC

## 2021-02-01 MED ORDER — FREESTYLE LIBRE 2 SENSOR MISC: 1.0000 | 3 refills | Status: DC

## 2021-02-01 MED ORDER — FREESTYLE LIBRE 2 READER DEVI: 1.0000 | Freq: Once | 1 refills | Status: AC

## 2021-02-01 MED ORDER — INSULIN NPH (HUMAN) (ISOPHANE) 100 UNIT/ML ~~LOC~~ SUSP: 230.0000 [IU] | Freq: Every day | SUBCUTANEOUS | 3 refills | Status: DC

## 2021-02-01 NOTE — Progress Notes (Signed)
Subjective:    Patient ID: Donald [name], male    DOB: [birth year], 76 y.o.   MRN: [id number]  HPI Pt returns for f/u of diabetes mellitus: DM type: Insulin-requiring type 2.   Dx'ed: 4098 Complications: PN, CAD and stage 4 CRI.   Therapy: insulin since 1991.  DKA: never.  Severe hypoglycemia: last episode was 2022.   Pancreatitis: never.   SDOH: he declines name brand meds; he stopped Mounjaro (2022) due to cost.   Other: he takes multiple daily injections; he declines weight-loss surgery; he has severe insulin resistance; he declines pump; He often eats 2 meals per day, so he takes only 2 daily injections of novolog.   Interval history: no cbg record, but states cbg varies from 126-286.  It is both highest and lowest in the afternoon.  No recent steroids.  He has mild hypoglycemia approx twice per month.  Main symptom is ongoing back pain.  He has mild hypoglycemia approx once per month.   Past Medical History:  Diagnosis Date   (HFpEF) heart failure with preserved ejection fraction ([place])    a. 09/2018 Echo: EF 60-65%. PASP 39mmHg. Mild-mod LAE. Mild MR/TR.   ANEMIA-IRON DEFICIENCY 07/26/2006   Anxiety    ASTHMA 07/26/2006   Asthma    Back pain, chronic    Bell's palsy    CKD (chronic kidney disease), stage III ([place])    COLONIC POLYPS, HX OF 07/26/2006   COVID-19 virus infection 01/2020   DEPRESSION 03/14/2009   DIABETES MELLITUS, TYPE II 07/26/2006   [place] DISEASE, LUMBAR 10/05/2007   DVT 12/03/2007   DYSLIPIDEMIA 04/13/2009   Dyspnea    when gets up and walks around and back is hurting really bad -only Shortness of breath  then   GERD 07/26/2006   Gout    Heart murmur    History of kidney stones    HYPERTENSION 07/26/2006   INSOMNIA 08/21/2007   Neuropathy    Nonobstructive CAD (coronary artery disease)    a. 2012 Cath: no high grade stenosis; b. 2018 MV: No ischemia. Attenuation artifact.    OBSTRUCTIVE SLEEP APNEA 12/03/2007   Use C-PAP   PERIPHERAL NEUROPATHY 07/26/2006    Pernicious anemia 11/20/2006   PULMONARY EMBOLISM 10/05/2007    Past Surgical History:  Procedure Laterality Date   [place], 06/14/2008   Dr. Trenton Gammon at [facility] [phone])   [place] Right 10/2017   CATARACT EXTRACTION W/PHACO Left 05/30/2014   Procedure: CATARACT EXTRACTION PHACO AND INTRAOCULAR LENS PLACEMENT ([place]);  Surgeon: Estill Cotta, MD;  Location: [facility] ORS;  Service: Ophthalmology;  Laterality: Left;  [name] 01:20 AP% 23.7 CDE 31.86   COLONOSCOPY  10/2002   HP, SSA, TA, rpt 3 yrs ([place])   COLONOSCOPY  01/2005   diverticulosis rpt 5 yrs ([place])    IR NEPHROSTOMY PLACEMENT LEFT  10/19/2018   IR URETERAL STENT LEFT NEW ACCESS W/O SEP NEPHROSTOMY CATH  10/19/2018   LEFT HEART CATH AND CORONARY ANGIOGRAPHY N/A 11/01/2020   Procedure: LEFT HEART CATH AND CORONARY ANGIOGRAPHY;  Surgeon: Nelva Bush, MD;  Location: [facility];  Service: Cardiovascular;  Laterality: N/A;   LITHOTRIPSY     X 2   NEPHROLITHOTOMY Left 10/19/2018   Procedure: NEPHROLITHOTOMY PERCUTANEOUS;  Surgeon: Franchot Gallo, MD;  Location: [facility];  Service: Urology;  Laterality: Left;  3 HRS   PARATHYROIDECTOMY Right 04/18/2016   PARATHYROIDECTOMY for cyst (  [name], MD)   SHOULDER ARTHROSCOPY W/ ROTATOR CUFF REPAIR Right    TONSILLECTOMY      Social History   Socioeconomic History   Marital status: Married    Spouse name: Not on file   Number of children: Not on file   Years of education: Not on file   Highest education level: Not on file  Occupational History   Occupation: Disabled    Employer: [employer]  Tobacco Use   Smoking status: Never   Smokeless tobacco: Never  Vaping Use   Vaping Use: Never used  Substance and Sexual Activity   Alcohol use: No   Drug use: No   Sexual activity: Not on file  Other Topics Concern   Not on file  Social History Narrative   Married   Children   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   Activity is severely limited by medical problems   Diet is "good".   Never a smoker   Alcohol: none         Social Determinants of Radio broadcast assistant Strain: Medium Risk   Difficulty of Paying Living Expenses: Somewhat hard  Food Insecurity: Not on file  Transportation Needs: Not on file  Physical Activity: Not on file  Stress: Not on file  Social Connections: Not on file  Intimate Partner Violence: Not on file    Current Outpatient Medications on File Prior to Visit  Medication Sig Dispense Refill   albuterol (VENTOLIN HFA) 108 (90 Base) MCG/ACT inhaler Inhale 2 puffs into the lungs every 6 (six) hours as needed for wheezing or shortness of breath. 8 g 0   allopurinol (ZYLOPRIM) 300 MG tablet Take 1 tablet (300 mg total) by mouth daily. 90 tablet 3   ALPRAZolam (XANAX) 1 MG tablet TAKE 1 TABLET BY MOUTH AT BEDTIME IF NEEDED FOR SLEEP 30 tablet 3   apixaban (ELIQUIS) 5 MG TABS tablet Take 1 tablet (5 mg total) by mouth 2 (two) times daily. 180 tablet 3   Cholecalciferol (VITAMIN D3) 25 MCG (1000 UT) CAPS Take 1 capsule (1,000 Units total) by mouth daily. 30 capsule    colchicine (COLCRYS) 0.6 MG tablet Take 1 tablet (0.6 mg total) by mouth daily as needed (gout flare). On first day of gout flare may take 2 tablets at once. 30 tablet 4   cyanocobalamin (,VITAMIN B-12,) 1000 MCG/ML injection Inject 1 mL (1,000 mcg total) into the muscle every 30 (thirty) days. 1 mL 11   cyclobenzaprine (FLEXERIL) 10 MG tablet Take 10 mg by mouth 3 (three) times daily as needed for muscle spasms.     doxazosin (CARDURA) 1 MG tablet Take 1 tablet (1 mg total) by mouth 2 (two) times daily. 180 tablet 3   fluticasone (FLONASE) 50 MCG/ACT nasal spray Place 2 sprays into both nostrils daily. 16 g 1   furosemide (LASIX) 80 MG tablet TAKE ONE TABLET (80 MG) BY MOUTH EVERY DAY take an extra lasix for swelling 100 tablet 3   gabapentin (NEURONTIN) 600 MG  tablet TAKE TWO TABLETS AT BEDTIME 180 tablet 3   GE100 BLOOD GLUCOSE TEST test strip TEST FOUR TIMES DAILY 200 each 1   HYDROcodone-acetaminophen (NORCO) 10-325 MG tablet Take 1 tablet by mouth every 6 (six) hours as needed for moderate pain ((typically once to twice daily if needed)).  30 tablet 0   isosorbide mononitrate (IMDUR) 30 MG 24 hr tablet Take 1 tablet (30 mg total) by mouth daily. 90 tablet 3  losartan (COZAAR) 25 MG tablet Take 1 tablet (25 mg total) by mouth at bedtime. 90 tablet 3   lovastatin (MEVACOR) 40 MG tablet Take 1 tablet (40 mg total) by mouth at bedtime. 90 tablet 3   Naphazoline-Pheniramine (OPCON-A OP) Place 1 drop into both eyes 3 (three) times daily as needed (itchy eyes.).      ondansetron (ZOFRAN) 4 MG tablet Take 1 tablet (4 mg total) by mouth every 4 (four) hours as needed for nausea. 10 tablet 0   potassium chloride SA (KLOR-CON M) 10 MEQ tablet Take 2 tablets (20 mEq total) by mouth 2 (two) times daily. 120 tablet 1   promethazine-dextromethorphan (PROMETHAZINE-DM) 6.25-15 MG/5ML syrup Take 5 mLs by mouth 4 (four) times daily as needed. 118 mL 0   Sennosides-Docusate Sodium (STOOL SOFTENER & LAXATIVE PO) Take 2 tablets by mouth at bedtime.     Syringe/Needle, Disp, (SYRINGE 3CC/22GX1-1/2") 22G X 1-1/2" 3 ML MISC Use to administer monthly b12 shots 50 each 0   No current facility-administered medications on file prior to visit.    Allergies  Allergen Reactions   Codeine Other (See Comments)    REACTION: chest pain   Pioglitazone Other (See Comments)    REACTION to Actos: swelling in ankles    Family History  Problem Relation Age of Onset   Cancer Mother        Breast Cancer   Cancer Sister        Breast Cancer   Diabetes Father    Heart disease Father    Diabetes Paternal Grandfather     BP (!) 120/54    Pulse 63    Ht 5\' 5"  (1.651 m)    Wt 259 lb 9.6 oz (117.8 kg)    SpO2 96%    BMI 43.20 kg/m    Review of Systems     Objective:    Physical Exam    Lab Results  Component Value Date   HGBA1C 7.8 (A) 02/01/2021      Assessment & Plan:  Insulin-requiring type 2 DM.   Hypoglycemia, due to insulin.    Patient Instructions  I have sent prescriptions to a different pharmacy, for the continuous glucose monitor.   Please reduce both insulins.   After any steroid injection, take 10 extra units for any blood sugar over 200.   check your blood sugar twice a day.  vary the time of day when you check, between before the 3 meals, and at bedtime.  also check if you have symptoms of your blood sugar being too high or too low.  please keep a record of the readings and bring it to your next appointment here (or you can bring the meter itself).  You can write it on any piece of paper.  please call us sooner if your blood sugar goes below 70, or if you have a lot of readings over 200.   Please come back for a follow-up appointment in 2-3 months.

## 2021-02-01 NOTE — Patient Instructions (Addendum)
I have sent prescriptions to a different pharmacy, for the continuous glucose monitor.   Please reduce both insulins.   After any steroid injection, take 10 extra units for any blood sugar over 200.   check your blood sugar twice a day.  vary the time of day when you check, between before the 3 meals, and at bedtime.  also check if you have symptoms of your blood sugar being too high or too low.  please keep a record of the readings and bring it to your next appointment here (or you can bring the meter itself).  You can write it on any piece of paper.  please call us sooner if your blood sugar goes below 70, or if you have a lot of readings over 200.   Please come back for a follow-up appointment in 2-3 months.

## 2021-02-02 NOTE — Telephone Encounter (Signed)
Called and spoke with pharmacist that advised due to high dosage rx needed to be confirmed. Dr Loanne Drilling confirmed doses for both Novolin N (230 units daily) and Novolin R (200 units b4 bfast, 140 units b4 lunch and 280 units b4 dinner). Rx to be overridden and filled.

## 2021-02-02 NOTE — Telephone Encounter (Signed)
Patient called re: Patient PHARM will not fill the following RX's due to [facility] needs clarification of the dosage for both insulin NPH Human (NOVOLIN N) 100 UNIT/ML injection insulin regular (NOVOLIN R RELION) 100 units/mL injection  PHARM told Patient that they sent a request for clarification with no response. Patient requests the following PHARM be contacted immediately for the above due Patient is out of the above medications as of tonight.

## 2021-02-12 ENCOUNTER — Other Ambulatory Visit: Payer: Self-pay | Admitting: Endocrinology

## 2021-02-12 DIAGNOSIS — E119 Type 2 diabetes mellitus without complications: Secondary | ICD-10-CM

## 2021-02-15 ENCOUNTER — Other Ambulatory Visit: Payer: Self-pay

## 2021-02-15 ENCOUNTER — Other Ambulatory Visit: Payer: Self-pay | Admitting: Endocrinology

## 2021-02-15 DIAGNOSIS — E119 Type 2 diabetes mellitus without complications: Secondary | ICD-10-CM

## 2021-02-15 MED ORDER — GE100 BLOOD GLUCOSE TEST VI STRP: ORAL_STRIP | 1 refills | Status: DC

## 2021-02-26 DIAGNOSIS — D631 Anemia in chronic kidney disease: Secondary | ICD-10-CM | POA: Diagnosis not present

## 2021-02-26 DIAGNOSIS — N1831 Chronic kidney disease, stage 3a: Secondary | ICD-10-CM | POA: Diagnosis not present

## 2021-02-26 DIAGNOSIS — N179 Acute kidney failure, unspecified: Secondary | ICD-10-CM | POA: Diagnosis not present

## 2021-02-26 DIAGNOSIS — N2581 Secondary hyperparathyroidism of renal origin: Secondary | ICD-10-CM | POA: Diagnosis not present

## 2021-02-26 DIAGNOSIS — E870 Hyperosmolality and hypernatremia: Secondary | ICD-10-CM | POA: Diagnosis not present

## 2021-02-26 DIAGNOSIS — G894 Chronic pain syndrome: Secondary | ICD-10-CM | POA: Diagnosis not present

## 2021-02-26 DIAGNOSIS — M62830 Muscle spasm of back: Secondary | ICD-10-CM | POA: Diagnosis not present

## 2021-02-26 DIAGNOSIS — Z6841 Body Mass Index (BMI) 40.0 and over, adult: Secondary | ICD-10-CM | POA: Diagnosis not present

## 2021-02-26 DIAGNOSIS — D351 Benign neoplasm of parathyroid gland: Secondary | ICD-10-CM | POA: Diagnosis not present

## 2021-02-26 DIAGNOSIS — E1122 Type 2 diabetes mellitus with diabetic chronic kidney disease: Secondary | ICD-10-CM | POA: Diagnosis not present

## 2021-02-26 DIAGNOSIS — I129 Hypertensive chronic kidney disease with stage 1 through stage 4 chronic kidney disease, or unspecified chronic kidney disease: Secondary | ICD-10-CM | POA: Diagnosis not present

## 2021-02-26 DIAGNOSIS — N200 Calculus of kidney: Secondary | ICD-10-CM | POA: Diagnosis not present

## 2021-02-26 DIAGNOSIS — M109 Gout, unspecified: Secondary | ICD-10-CM | POA: Diagnosis not present

## 2021-02-26 DIAGNOSIS — D6869 Other thrombophilia: Secondary | ICD-10-CM | POA: Diagnosis not present

## 2021-02-26 DIAGNOSIS — G4733 Obstructive sleep apnea (adult) (pediatric): Secondary | ICD-10-CM | POA: Diagnosis not present

## 2021-03-08 DIAGNOSIS — Z20822 Contact with and (suspected) exposure to covid-19: Secondary | ICD-10-CM | POA: Diagnosis not present

## 2021-03-12 ENCOUNTER — Ambulatory Visit: Payer: Medicare Other | Admitting: Endocrinology

## 2021-03-12 DIAGNOSIS — N200 Calculus of kidney: Secondary | ICD-10-CM | POA: Diagnosis not present

## 2021-03-14 ENCOUNTER — Other Ambulatory Visit: Payer: Self-pay | Admitting: Family Medicine

## 2021-03-15 DIAGNOSIS — Z6841 Body Mass Index (BMI) 40.0 and over, adult: Secondary | ICD-10-CM | POA: Diagnosis not present

## 2021-03-15 DIAGNOSIS — M25561 Pain in right knee: Secondary | ICD-10-CM | POA: Diagnosis not present

## 2021-03-15 DIAGNOSIS — M1711 Unilateral primary osteoarthritis, right knee: Secondary | ICD-10-CM | POA: Diagnosis not present

## 2021-03-20 ENCOUNTER — Telehealth: Payer: Self-pay

## 2021-03-20 ENCOUNTER — Other Ambulatory Visit: Payer: Self-pay | Admitting: Family Medicine

## 2021-03-20 NOTE — Telephone Encounter (Signed)
Spoke with pt and trying to get a cheaper insulin bc he could not afford what he was already on. I advised the pt to contact his insurance carrier and ask them for a cheaper alternative to the medication that he is currently on and see if that would be better suited for his budget and let us know. ?

## 2021-03-22 ENCOUNTER — Telehealth: Payer: Self-pay | Admitting: Cardiovascular Disease

## 2021-03-22 NOTE — Telephone Encounter (Signed)
Patient calling back states he has not filled out application, please assist. ?

## 2021-03-22 NOTE — Telephone Encounter (Signed)
Left voicemail message to call back for review of information on assistance requirements.  ?

## 2021-03-22 NOTE — Telephone Encounter (Signed)
Patient is calling need assistance with Eliquis states too expensive, please assist ?

## 2021-03-23 ENCOUNTER — Other Ambulatory Visit (HOSPITAL_COMMUNITY): Payer: Self-pay

## 2021-03-23 NOTE — Telephone Encounter (Signed)
Reach back out to Donald [name], reminded pt of PA application for Eliquis given in Nov, pt reports still has application. Suggest he complete application, make copies of any W2 or SSN income for the 2022 year and print out from pharmacy for 2023 year of his out-of-pocket expense so far. ? ?Advised may need to pay for eliquis at least once in order to meet the 3% of his annual household income to qualify. Pt voiced understadning. ? ?Pt asked about alternative to eliquis, educated pt on xarelto and coumadin and the maintenance that comes with coumadin. Pt reports will complete the Eliquis PA application and bring in to office.  ? ?Otherwise all questions were address and no additional concerns at this time. Donald [name] thankful for the return call and advice. Agreeable to plan, will call back for anything further.   ? ?

## 2021-04-04 ENCOUNTER — Telehealth: Payer: Self-pay

## 2021-04-04 NOTE — Chronic Care Management (AMB) (Signed)
? ? ?Chronic Care Management ?Pharmacy Assistant  ? ?Name: Donald [name]  MRN: [id number] DOB: 1945/11/13 ? ? ?Reason for Encounter: General Adherence  ?  ? ?Recent office visits:  ?01/24/21-PCP-Javier Gutierrez,MD- follow up-Recent hospitalization for syncope 11/2020 - NSTEMI with new onset afib, started on eliquis and imdur. . Notes some low sugar readings recently. but notes difficulty swallowing large potassium pills. Will drop dose to 38mq to take 2 tab BID,  ?01/12/21-Family Medicine-Padonda Webb,FNP- Telemedicine-positive covid-19 home test-start molnupiravir EUIA '200mg'$  take 4 capsules 2 times daily for 5 days, stat promethazine -DM- 6.25-'15mg'$ /ml syrup,take 598mby mouth 4 times daily as needed. ?11/15/20-PCP-Javier Gutierrez,MD-Hospital follow up-syncope- added Zio patch -future labs ordered, follow up with cardiology. ? ?Recent consult visits:  ?02/01/21-Endocrinology-Sean Ellison,MD-follow up diabetes, I have sent prescriptions to a different pharmacy, for the continuous glucose monitor.  Please reduce both insulins.  After any steroid injection, take 10 extra units for any blood sugar over 200.   ?12/29/20-Orthopedic surgery- no data found ?11/30/20-Podiatry-Kevin Patel,DPM- Left foot injection 0.5cc celestone  ?11/13/20-Cardiology-Timothy Gollan,MD- Cardiac Cath follow up.compete application for assistance with Eliquis, start Imdur '30mg'$  take 1 tablet daily ,stop aspirin ?11/09/20-Endocrinology-Sean Ellison,MD-Follow up diabetes-We'll start CGM, Please reduce both insulins.   ? ? ?Hospital visits:  ?Medication Reconciliation was completed by comparing discharge summary, patient?s EMR and Pharmacy list, and upon discussion with patient. ? ?Admitted to the hospital on 10/30/20 due to syncope and collaspe. Discharge date was 11/02/20. [facility]  [facility] ?-[facility] Eli[facility] ?             Atorvastatin  ?  Imdur ?  Klor Con  ? ?Medications  Discontinued at Hospital Discharge: ?-Stopped Lovastatin  ? ?Medications that remain the same after Hospital Discharge:??  ?-All other medications will remain the same.   ? ?Medications: ?Outpatient Encounter Medications as of 04/04/2021  ?Medication Sig Note  ? albuterol (VENTOLIN HFA) 108 (90 Base) MCG/ACT inhaler Inhale 2 puffs into the lungs every 6 (six) hours as needed for wheezing or shortness of breath.   ? allopurinol (ZYLOPRIM) 300 MG tablet Take 1 tablet (300 mg total) by mouth daily.   ? ALPRAZolam (XANAX) 1 MG tablet TAKE 1 TABLET BY MOUTH AT BEDTIME IF NEEDED FOR SLEEP 10/31/2020: Patient takes 0.5 mg at bedtime as needed  ? apixaban (ELIQUIS) 5 MG TABS tablet Take 1 tablet (5 mg total) by mouth 2 (two) times daily.   ? Cholecalciferol (VITAMIN D3) 25 MCG (1000 UT) CAPS Take 1 capsule (1,000 Units total) by mouth daily.   ? colchicine (COLCRYS) 0.6 MG tablet Take 1 tablet (0.6 mg total) by mouth daily as needed (gout flare). On first day of gout flare may take 2 tablets at once.   ? Continuous Blood Gluc Sensor (FREESTYLE LIBRE 2 SENSOR) MISC 1 Device by Does not apply route every 14 (fourteen) days.   ? cyanocobalamin (,VITAMIN B-12,) 1000 MCG/ML injection INJECT 1ML INTRAMUSCULARLY EVERY 30 DAYS   ? cyclobenzaprine (FLEXERIL) 10 MG tablet Take 10 mg by mouth 3 (three) times daily as needed for muscle spasms.   ? doxazosin (CARDURA) 1 MG tablet Take 1 tablet (1 mg total) by mouth 2 (two) times daily.   ? fluticasone (FLONASE) 50 MCG/ACT nasal spray Place 2 sprays into both nostrils daily.   ? furosemide (LASIX) 80 MG tablet TAKE ONE TABLET (80 MG) BY MOUTH EVERY DAY take an extra lasix for swelling   ? gabapentin (NEURONTIN) 600  MG tablet TAKE TWO TABLETS AT BEDTIME   ? glucose blood (GE100 BLOOD GLUCOSE TEST) test strip TEST FOUR TIMES DAILY   ? HYDROcodone-acetaminophen (NORCO) 10-325 MG tablet Take 1 tablet by mouth every 6 (six) hours as needed for moderate pain ((typically once to twice daily if  needed)).    ? insulin NPH Human (NOVOLIN N) 100 UNIT/ML injection Inject 2.3 mLs (230 Units total) into the skin at bedtime.   ? insulin regular (NOVOLIN R RELION) 100 units/mL injection 3 times a day (just before each meal), 200-140-280 units, and syringes 4/day.   ? isosorbide mononitrate (IMDUR) 30 MG 24 hr tablet Take 1 tablet (30 mg total) by mouth daily.   ? losartan (COZAAR) 25 MG tablet Take 1 tablet (25 mg total) by mouth at bedtime.   ? lovastatin (MEVACOR) 40 MG tablet Take 1 tablet (40 mg total) by mouth at bedtime.   ? Naphazoline-Pheniramine (OPCON-A OP) Place 1 drop into both eyes 3 (three) times daily as needed (itchy eyes.).    ? ondansetron (ZOFRAN) 4 MG tablet Take 1 tablet (4 mg total) by mouth every 4 (four) hours as needed for nausea.   ? potassium chloride (KLOR-CON M) 10 MEQ tablet TAKE 2 TABLETS BY MOUTH TWICE DAILY   ? promethazine-dextromethorphan (PROMETHAZINE-DM) 6.25-15 MG/5ML syrup Take 5 mLs by mouth 4 (four) times daily as needed.   ? Sennosides-Docusate Sodium (STOOL SOFTENER & LAXATIVE PO) Take 2 tablets by mouth at bedtime.   ? Syringe/Needle, Disp, (SYRINGE 3CC/22GX1-1/2") 22G X 1-1/2" 3 ML MISC Use to administer monthly b12 shots   ? ?No facility-administered encounter medications on file as of 04/04/2021.  ? ? ?Contacted [facility] on 04/11/21 for general disease state and medication adherence call.  ? ?Patient is not more than 5 days past due for refill on the following medications per chart history: ? ?[facility] Medications: ?Medication Name/mg Last Fill Days Supply ?Novolin R  Gets from [place]   ?Novolin N  Get from [place] ?Losartan '25mg'$   01/13/21 90 ?Lovastatin '40mg'$   03/14/21  90 ? ? ? ?What concerns do you have about your medications? Patient states he is tolerating the potassium medication added recently  ? ?The patient denies side effects with their medications.  ? ?How often do you forget or accidentally miss a dose? Rarely  He ran out of Eliquis -started '81mg'$   aspirin ? ?Do you use a pillbox? Yes ? ?Are you having any problems getting your medications from your pharmacy? No ? ?Has the cost of your medications been a concern? Yes Eliquis- Cardiology office trying to get patient assistance-patient does not qualify due to income.- Patient is taking '81mg'$  aspirin ? ?Since last visit with CPP, the following interventions have been made. sent prescriptions to a different pharmacy, for the continuous glucose monitor. - made inquiry to [facility]- prescription taken again- and coordinator to follow up with patient for confirmation of delivery now. Lexie'@[phone]'$ -[id number] ? ?The patient has had an ED visit since last contact. 10/30/20-[facility] admitted for syncope-started on potassium ? ?The patient reports the following problems with their health. He has mild hypoglycemia approx once per month.- awaiting Freestyle Libre II - does finger sticks 3-4 times daily-   ? ?Patient denies concerns or questions for Charlene Brooke, PharmD at this time.  ? ?Counseled patient on:  ?Importance of taking medication daily without missed doses, Benefits of adherence packaging or a pillbox, and Access to CCM team for any cost, medication or pharmacy concerns. ? ? ?  Care Gaps: ?Annual wellness visit in last year? Yes ?Most Recent BP reading:120/54  63-P ? ?If Diabetic: ?Most recent A1C reading:7.8  02/01/21 ?Last eye exam / retinopathy screening:2019 ?Last diabetic foot exam: 07/10/20 ? ?Upcoming appointments: ?Endocrinology appointment on 05/02/21 and Cardiology appointment on 05/14/21 ? ? ?Charlene Brooke, CPP notified ? ?Lennart Gladish, CCMA ?Health concierge  ?[phone]  ? ? ? ?  ? ? ? ?

## 2021-04-05 DIAGNOSIS — Z20822 Contact with and (suspected) exposure to covid-19: Secondary | ICD-10-CM | POA: Diagnosis not present

## 2021-04-06 DIAGNOSIS — M1711 Unilateral primary osteoarthritis, right knee: Secondary | ICD-10-CM | POA: Diagnosis not present

## 2021-04-13 DIAGNOSIS — M1711 Unilateral primary osteoarthritis, right knee: Secondary | ICD-10-CM | POA: Diagnosis not present

## 2021-04-13 DIAGNOSIS — M25561 Pain in right knee: Secondary | ICD-10-CM | POA: Diagnosis not present

## 2021-04-16 ENCOUNTER — Other Ambulatory Visit: Payer: Self-pay | Admitting: Family Medicine

## 2021-04-16 NOTE — Telephone Encounter (Signed)
ERx 

## 2021-04-16 NOTE — Telephone Encounter (Signed)
Refill request alprazolam ?Last refill 10/05/20 #30/3 ?Last office visit 01/24/21 ?

## 2021-04-18 NOTE — Telephone Encounter (Signed)
Engaged with the patient . He will call his cardiologist to report not taking Eliquis due to cost and is taking aspirin.Cardiology office did suggest patient assistance work up.Patient reports previous applications for other medications denied due to household income and he not convinced he would be approved. The patient reports he was on Xarelto in the past and this medication is affordable to him.Appointment made for patient to follow up with pharmacist ? ?Charlene Brooke, CPP notified ? ?Haydn Cush, CCMA ?Health concierge  ?[phone]  ?

## 2021-04-23 ENCOUNTER — Telehealth: Payer: Medicare Other

## 2021-04-23 ENCOUNTER — Telehealth: Payer: Self-pay | Admitting: Pharmacist

## 2021-04-23 NOTE — Telephone Encounter (Signed)
?  Chronic Care Management  ? ?Outreach Note ? ?04/23/2021 ?Name: Donald [name] MRN: [id number] DOB: January 02, 1946 ? ?Referred by: Ria Bush, MD ? ?Patient had a phone appointment scheduled with clinical pharmacist today. ? ?An unsuccessful telephone outreach was attempted today. The patient was referred to the pharmacist for assistance with medications, care management and care coordination.  ? ?Patient will NOT be penalized in any way for missing a CCM appointment. The no-show fee does not apply. ? ?If possible, a message was left to return call to: [phone] or to [facility]. ? ?Charlene Brooke, PharmD, BCACP ?Clinical Pharmacist ?[facility] ?[phone] ? ? ?

## 2021-04-23 NOTE — Telephone Encounter (Signed)
Pt returning your call. Call back [phone] ?

## 2021-04-23 NOTE — Progress Notes (Deleted)
? ?Chronic Care Management ?Pharmacy Note ? ?04/23/2021 ?Name:  Donald [name] MRN:  [id number] DOB:  09/23/1945 ? ?Summary: ?*** ? ?Recommendations/Changes made from today's visit: ?*** ? ?Plan: ?*** ? ? ?Subjective: ?Donald [name] is an 76 y.o. year old male who is a primary patient of Ria Bush, MD.  The CCM team was consulted for assistance with disease management and care coordination needs.   ? ?Engaged with patient by telephone for follow up visit in response to provider referral for pharmacy case management and/or care coordination services.  ? ?Consent to Services:  ?The patient was given information about Chronic Care Management services, agreed to services, and gave verbal consent prior to initiation of services.  Please see initial visit note for detailed documentation.  ? ?Patient Care Team: ?Ria Bush, MD as PCP - General (Family Medicine) ?Suella Broad, MD (Physical Medicine and Rehabilitation) ?Earnie Larsson, MD as Attending Physician (Neurosurgery) ?Rigoberto Noel, MD as Consulting Physician (Pulmonary Disease) ?Estill Cotta, MD (Ophthalmology) ?Minna Merritts, MD as Consulting Physician (Cardiology) ?Debbora Dus, [facility] as Pharmacist (Pharmacist) ? ?Recent office visits: ?01/24/21-PCP-Javier Gutierrez,MD- follow up-Recent hospitalization for syncope 11/2020 - NSTEMI with new onset afib, started on eliquis and imdur. . Notes some low sugar readings recently. but notes difficulty swallowing large potassium pills. Will drop dose to 89mq to take 2 tab BID ?  ?01/12/21-Family Medicine-Padonda Webb,FNP- Telemedicine-positive covid-19 home test-start molnupiravir EUIA 2012mtake 4 capsules 2 times daily for 5 days, stat promethazine -DM- 6.25-147mg/ml syrup,take 47m77my mouth 4 times daily as needed. ? ?11/15/20-PCP-Javier Gutierrez,MD-Hospital follow up-syncope- added Zio patch -future labs ordered, follow up with cardiology. ? ?Recent consult  visits: ?02/01/21-Endocrinology-Sean Ellison,MD-follow up diabetes, I have sent prescriptions to a different pharmacy, for the continuous glucose monitor.  Please reduce both insulins.  After any steroid injection, take 10 extra units for any blood sugar over 200.   ?12/29/20-Orthopedic surgery- no data found ? ?11/30/20-Podiatry-Kevin Patel,DPM- Left foot injection 0.5cc celestone  ?11/13/20-Cardiology-Timothy Gollan,MD- Cardiac Cath follow up.compete application for assistance with Eliquis, start Imdur 82m2mke 1 tablet daily ,stop aspirin ?11/09/20-Endocrinology-Sean Ellison,MD-Follow up diabetes-We'll start CGM, Please reduce both insulins.   ? ?Hospital visits: ?{Hospital DC Yes/No:25215} ? ? ?Objective: ? ?Lab Results  ?Component Value Date  ? CREATININE 1.68 (H) 11/15/2020  ? BUN 35 (H) 11/15/2020  ? GFR 40.24 (L) 11/15/2020  ? GFRNONAA 46 (L) 11/02/2020  ? GFRAA 52 (L) 10/19/2018  ? NA 140 11/15/2020  ? K 4.9 11/15/2020  ? CALCIUM 9.2 11/15/2020  ? CO2 32 11/15/2020  ? GLUCOSE 225 (H) 11/15/2020  ? ? ?Lab Results  ?Component Value Date/Time  ? HGBA1C 7.8 (A) 02/01/2021 03:21 PM  ? HGBA1C 7.3 (A) 11/09/2020 11:27 AM  ? HGBA1C 7.0 (H) 10/30/2020 02:13 PM  ? HGBA1C 7.6 (H) 06/19/2020 08:24 AM  ? FRUCTOSAMINE 319 (H) 10/21/2017 04:54 PM  ? FRUCTOSAMINE 296 (H) 03/11/2011 10:48 AM  ? GFR 40.24 (L) 11/15/2020 11:57 AM  ? GFR 45.15 (L) 06/19/2020 08:24 AM  ? MICROALBUR 12.0 (H) 03/02/2015 11:42 AM  ? MICROALBUR 2.6 (H) 01/27/2014 11:49 AM  ?  ?Last diabetic Eye exam:  ?Lab Results  ?Component Value Date/Time  ? HMDIABEYEEXA Retinopathy (A) 05/21/2017 03:54 PM  ?  ?Last diabetic Foot exam: No results found for: HMDIABFOOTEX  ? ?Lab Results  ?Component Value Date  ? CHOL 119 10/31/2020  ? HDL 25 (L) 10/31/2020  ? LDLCNorth [place]10/18/2022  ? LDLDIRECT 52.2 05/25/2009  ? TRIG 214 (  H) 10/31/2020  ? CHOLHDL 4.8 10/31/2020  ? ? ? ?  Latest Ref Rng & Units 11/15/2020  ? 11:57 AM 10/31/2020  ?  1:55 PM 06/19/2020  ?  8:24 AM   ?Hepatic Function  ?Total Protein 6.5 - 8.1 g/dL  5.6   6.1    ?Albumin 3.5 - 5.2 g/dL 3.9   3.1   4.0    ?AST 15 - 41 U/L  23   15    ?ALT 0 - 44 U/L  15   16    ?Alk Phosphatase 38 - 126 U/L  72   94    ?Total Bilirubin 0.3 - 1.2 mg/dL  1.7   1.4    ?Bilirubin, Direct 0.0 - 0.2 mg/dL  0.3     ? ? ?Lab Results  ?Component Value Date/Time  ? TSH 2.156 10/31/2020 01:55 PM  ? TSH 2.448 10/30/2020 02:13 PM  ? TSH 3.01 03/09/2020 02:12 PM  ? TSH 3.14 10/21/2017 04:54 PM  ? FREET4 1.04 10/30/2020 02:13 PM  ? ? ? ?  Latest Ref Rng & Units 11/02/2020  ?  2:51 AM 11/01/2020  ?  7:02 AM 10/31/2020  ?  4:51 PM  ?CBC  ?WBC 4.0 - 10.5 K/uL 7.5   6.2   6.5    ?Hemoglobin 13.0 - 17.0 g/dL 13.8   13.6   13.9    ?Hematocrit 39.0 - 52.0 % 41.9   43.3   43.7    ?Platelets 150 - 400 K/uL 195   177   198    ? ? ?Lab Results  ?Component Value Date/Time  ? VD25OH 32.87 06/19/2020 08:24 AM  ? VD25OH 21.33 (L) 03/09/2020 02:12 PM  ? ? ?Clinical ASCVD: Yes  ?The ASCVD Risk score (Arnett DK, et al., 2019) failed to calculate for the following reasons: ?  The patient has a prior MI or stroke diagnosis   ? ? ?  06/21/2020  ?  2:46 PM 04/20/2019  ?  2:06 PM 03/20/2018  ?  3:09 PM  ?Depression screen PHQ 2/9  ?Decreased Interest 0 0 0  ?Down, Depressed, Hopeless 0 0 0  ?PHQ - 2 Score 0 0 0  ?Altered sleeping 0 0   ?Tired, decreased energy 0 0   ?Change in appetite 0 0   ?Feeling bad or failure about yourself  0 0   ?Trouble concentrating 0 0   ?Moving slowly or fidgety/restless 0 0   ?Suicidal thoughts 0 0   ?PHQ-9 Score 0 0   ?Difficult doing work/chores  Not difficult at all   ?  ?CHA2DS2/VAS Stroke Risk Points  Current as of yesterday  ?   7 >= 2 Points: High Risk  ?1 - 1.99 Points: Medium Risk  ?0 Points: Low Risk  ?  No Change   ?  ? Details   ? This score determines the patient's risk of having a stroke if the  ?patient has atrial fibrillation.  ?  ?  ? Points Metrics  ?1 Has Congestive Heart Failure:  Yes   ? Current as of yesterday  ?1 Has  Vascular Disease:  Yes   ? Current as of yesterday  ?1 Has Hypertension:  Yes   ? Current as of yesterday  ?1 Age:  46   ? Current as of yesterday  ?1 Has Diabetes:  Yes   ? Current as of yesterday  ?2 Had Stroke:  No  Had TIA:  No  Had Thromboembolism:  Yes   ? Current as of yesterday  ?0 Male:  No   ? Current as of yesterday  ?  ?  ? ?Social History  ? ?Tobacco Use  ?Smoking Status Never  ?Smokeless Tobacco Never  ? ?BP Readings from Last 3 Encounters:  ?02/01/21 (!) 120/54  ?01/24/21 138/66  ?01/12/21 130/60  ? ?Pulse Readings from Last 3 Encounters:  ?02/01/21 63  ?01/24/21 (!) 53  ?11/15/20 (!) 54  ? ?Wt Readings from Last 3 Encounters:  ?02/01/21 259 lb 9.6 oz (117.8 kg)  ?01/24/21 252 lb (114.3 kg)  ?01/12/21 250 lb (113.4 kg)  ? ?BMI Readings from Last 3 Encounters:  ?02/01/21 43.20 kg/m?  ?01/24/21 41.93 kg/m?  ?01/12/21 41.60 kg/m?  ? ? ?Assessment/Interventions: Review of patient past medical history, allergies, medications, health status, including review of consultants reports, laboratory and other test data, was performed as part of comprehensive evaluation and provision of chronic care management services.  ? ?SDOH:  (Social Determinants of Health) assessments and interventions performed: {yes/no:20286} ? ?SDOH Screenings  ? ?Alcohol Screen: Not on file  ?Depression (PHQ2-9): Low Risk   ? PHQ-2 Score: 0  ?Financial Resource Strain: Medium Risk  ? Difficulty of Paying Living Expenses: Somewhat hard  ?Food Insecurity: Not on file  ?Housing: Not on file  ?Physical Activity: Not on file  ?Social Connections: Not on file  ?Stress: Not on file  ?Tobacco Use: Low Risk   ? Smoking Tobacco Use: Never  ? Smokeless Tobacco Use: Never  ? Passive Exposure: Not on file  ?Transportation Needs: Not on file  ? ? ?[place] ? ?Allergies  ?Allergen Reactions  ? Codeine Other (See Comments)  ?  REACTION: chest pain  ? Pioglitazone Other (See Comments)  ?  REACTION to Actos: swelling in ankles  ? ? ?Medications  Reviewed Today   ? ? Reviewed by Casandra Doffing, CMA (Certified Medical Assistant) on 02/01/21 at 1510  Med List Status: <None>  ? ?Medication Order Taking? Sig Documenting Provider Last Dose Status Informant  ?albuterol (VENTOLIN HFA) 1

## 2021-04-23 NOTE — Telephone Encounter (Signed)
Returned patient call. No answer, left voicemail again. ?

## 2021-05-02 ENCOUNTER — Ambulatory Visit (INDEPENDENT_AMBULATORY_CARE_PROVIDER_SITE_OTHER): Payer: Medicare Other | Admitting: Endocrinology

## 2021-05-02 ENCOUNTER — Encounter: Payer: Self-pay | Admitting: Endocrinology

## 2021-05-02 VITALS — BP 154/78 | HR 70 | Ht 65.0 in | Wt 262.8 lb

## 2021-05-02 DIAGNOSIS — E119 Type 2 diabetes mellitus without complications: Secondary | ICD-10-CM

## 2021-05-02 LAB — POCT GLYCOSYLATED HEMOGLOBIN (HGB A1C): Hemoglobin A1C: 7.3 % — AB (ref 4.0–5.6)

## 2021-05-02 MED ORDER — INSULIN NPH (HUMAN) (ISOPHANE) 100 UNIT/ML ~~LOC~~ SUSP: 220.0000 [IU] | Freq: Every day | SUBCUTANEOUS | 1 refills | Status: DC

## 2021-05-02 NOTE — Progress Notes (Signed)
? ?Subjective:  ? ? Patient ID: Donald [name], male    DOB: 1945/11/14, 76 y.o.   MRN: [id number] ? ?HPI ?Pt returns for f/u of diabetes mellitus: ?DM type: Insulin-requiring type 2.   ?Dx'ed: 1989 ?Complications: PN, CAD and stage 4 CRI.   ?Therapy: insulin since 1991.  ?DKA: never.  ?Severe hypoglycemia: last episode was 2022.   ?Pancreatitis: never.   ?SDOH: he declines name brand meds; he stopped Mounjaro (2022) due to cost.   ?Other: he takes multiple daily injections; he declines weight-loss surgery; he has severe insulin resistance; he declines pump; He often eats 2 meals per day, so he takes only 2 daily injections of novolog.   ?Interval history: He brings a record of his cbg's which I have reviewed today.  cbg varies from 47-224.  It is in general higher later in the day No recent steroids.  He has mild hypoglycemia approx twice per month.  He was unable to obtain continuous glucose monitor sensors.   ?Past Medical History:  ?Diagnosis Date  ? (HFpEF) heart failure with preserved ejection fraction ([place])   ? a. 09/2018 Echo: EF 60-65%. PASP 27mHg. Mild-mod LAE. Mild MR/TR.  ? ANEMIA-IRON DEFICIENCY 07/26/2006  ? Anxiety   ? ASTHMA 07/26/2006  ? Asthma   ? Back pain, chronic   ? Bell's palsy   ? CKD (chronic kidney disease), stage III ([place]   ? COLONIC POLYPS, HX OF 07/26/2006  ? COVID-19 virus infection 01/2020  ? DEPRESSION 03/14/2009  ? DIABETES MELLITUS, TYPE II 07/26/2006  ? DPerrymanDISEASE, LUMBAR 10/05/2007  ? DVT 12/03/2007  ? DYSLIPIDEMIA 04/13/2009  ? Dyspnea   ? when gets up and walks around and back is hurting really bad -only Shortness of breath  then  ? GERD 07/26/2006  ? Gout   ? Heart murmur   ? History of kidney stones   ? HYPERTENSION 07/26/2006  ? INSOMNIA 08/21/2007  ? Neuropathy   ? Nonobstructive CAD (coronary artery disease)   ? a. 2012 Cath: no high grade stenosis; b. 2018 MV: No ischemia. Attenuation artifact.   ? OBSTRUCTIVE SLEEP APNEA 12/03/2007  ? Use C-PAP  ? PERIPHERAL NEUROPATHY  07/26/2006  ? Pernicious anemia 11/20/2006  ? PULMONARY EMBOLISM 10/05/2007  ? ? ?Past Surgical History:  ?Procedure Laterality Date  ? APPENDECTOMY  1968  ? [place] 06/14/[place]  ? [facility]  ? CARDIAC CATHETERIZATION    ? CARPAL TUNNEL RELEASE Right 10/2017  ? CATARACT EXTRACTION W/PHACO Left 05/30/2014  ? Procedure: CATARACT EXTRACTION PHACO AND INTRAOCULAR LENS PLACEMENT (IOC);  Surgeon: SEstill Cotta MD;  Location: [facility] ORS;  Service: Ophthalmology;  Laterality: Left;  [place]:20 ?AP% 23.7 ?CDE 31.86  ? COLONOSCOPY  10/2002  ? HP, SSA, TA, rpt 3 yrs ([place])  ? COLONOSCOPY  01/2005  ? diverticulosis rpt 5 yrs ([place])   ? IR NEPHROSTOMY PLACEMENT LEFT  10/19/2018  ? IR URETERAL STENT LEFT NEW ACCESS W/O SEP NEPHROSTOMY CATH  10/19/2018  ? LEFT HEART CATH AND CORONARY ANGIOGRAPHY N/A 11/01/2020  ? Procedure: LEFT HEART CATH AND CORONARY ANGIOGRAPHY;  Surgeon: ENelva Bush MD;  [facility];  Service: Cardiovascular;  Laterality: N/A;  ? LITHOTRIPSY    ? X 2  ? NEPHROLITHOTOMY Left 10/19/2018  ? Procedure: NEPHROLITHOTOMY PERCUTANEOUS;  Surgeon: DFranchot Gallo MD;  [facility];  Service: Urology;  Laterality: Left;  3 HRS  ? PARATHYROIDECTOMY Right 04/18/2016  ? PARATHYROIDECTOMY for  cyst [name], MD)  ? SHOULDER ARTHROSCOPY W/ ROTATOR CUFF REPAIR Right   ? TONSILLECTOMY    ? ? ?Social History  ? ?Socioeconomic History  ? Marital status: Married  ?  Spouse name: Not on file  ? Number of children: Not on file  ? Years of education: Not on file  ? Highest education level: Not on file  ?Occupational History  ? Occupation: Disabled  ?  Employer: [employer]  ?Tobacco Use  ? Smoking status: Never  ? Smokeless tobacco: Never  ?Vaping Use  ? Vaping Use: Never used  ?Substance and Sexual Activity  ? Alcohol use: No  ? Drug use: No  ? Sexual activity: Not on file  ?Other Topics Concern  ? Not on file  ?Social History Narrative  ? Married  ? Children   ? 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  ? Activity is severely limited by medical problems  ? Diet is "good".  ? Never a smoker  ? Alcohol: none  ?   ?   ? ?Social Determinants of Health  ? ?Financial Resource Strain: Medium Risk  ? Difficulty of Paying Living Expenses: Somewhat hard  ?Food Insecurity: Not on file  ?Transportation Needs: Not on file  ?Physical Activity: Not on file  ?Stress: Not on file  ?Social Connections: Not on file  ?Intimate Partner Violence: Not on file  ? ? ?Current Outpatient Medications on File Prior to Visit  ?Medication Sig Dispense Refill  ? albuterol (VENTOLIN HFA) 108 (90 Base) MCG/ACT inhaler Inhale 2 puffs into the lungs every 6 (six) hours as needed for wheezing or shortness of breath. 8 g 0  ? allopurinol (ZYLOPRIM) 300 MG tablet Take 1 tablet (300 mg total) by mouth daily. 90 tablet 3  ? ALPRAZolam (XANAX) 1 MG tablet TAKE 1 TABLET BY MOUTH AT BEDTIME IF NEEDED FOR SLEEP. 30 tablet 0  ? apixaban (ELIQUIS) 5 MG TABS tablet Take 1 tablet (5 mg total) by mouth 2 (two) times daily. 180 tablet 3  ? Cholecalciferol (VITAMIN D3) 25 MCG (1000 UT) CAPS Take 1 capsule (1,000 Units total) by mouth daily. 30 capsule   ? colchicine (COLCRYS) 0.6 MG tablet Take 1 tablet (0.6 mg total) by mouth daily as needed (gout flare). On first day of gout flare may take 2 tablets at once. 30 tablet 4  ? Continuous Blood Gluc Sensor (FREESTYLE LIBRE 2 SENSOR) MISC 1 Device by Does not apply route every 14 (fourteen) days. 6 each 3  ? cyanocobalamin (,VITAMIN B-12,) 1000 MCG/ML injection INJECT 1ML INTRAMUSCULARLY EVERY 30 DAYS 1 mL 3  ? cyclobenzaprine (FLEXERIL) 10 MG tablet Take 10 mg by mouth 3 (three) times daily as needed for muscle spasms.    ? doxazosin (CARDURA) 1 MG tablet Take 1 tablet (1 mg total) by mouth 2 (two) times daily. 180 tablet 3  ? fluticasone (FLONASE) 50 MCG/ACT nasal spray Place 2 sprays into both nostrils daily. 16 g 1  ? furosemide (LASIX) 80 MG tablet TAKE ONE  TABLET (80 MG) BY MOUTH EVERY DAY take an extra lasix for swelling 100 tablet 3  ? gabapentin (NEURONTIN) 600 MG tablet TAKE TWO TABLETS AT BEDTIME 180 tablet 3  ? glucose blood (GE100 BLOOD GLUCOSE TEST) test strip TEST FOUR TIMES DAILY 200 each 1  ? HYDROcodone-acetaminophen (NORCO) 10-325 MG tablet Take 1 tablet by mouth every 6 (six) hours as needed for moderate pain ((typically once to twice daily if needed)).  30 tablet 0  ?  insulin regular (NOVOLIN R RELION) 100 units/mL injection 3 times a day (just before each meal), 200-140-280 units, and syringes 4/day. 210 mL 0  ? isosorbide mononitrate (IMDUR) 30 MG 24 hr tablet Take 1 tablet (30 mg total) by mouth daily. 90 tablet 3  ? losartan (COZAAR) 25 MG tablet Take 1 tablet (25 mg total) by mouth at bedtime. 90 tablet 3  ? lovastatin (MEVACOR) 40 MG tablet Take 1 tablet (40 mg total) by mouth at bedtime. 90 tablet 3  ? Naphazoline-Pheniramine (OPCON-A OP) Place 1 drop into both eyes 3 (three) times daily as needed (itchy eyes.).     ? ondansetron (ZOFRAN) 4 MG tablet Take 1 tablet (4 mg total) by mouth every 4 (four) hours as needed for nausea. 10 tablet 0  ? potassium chloride (KLOR-CON M) 10 MEQ tablet TAKE 2 TABLETS BY MOUTH TWICE DAILY 360 tablet 0  ? promethazine-dextromethorphan (PROMETHAZINE-DM) 6.25-15 MG/5ML syrup Take 5 mLs by mouth 4 (four) times daily as needed. 118 mL 0  ? Sennosides-Docusate Sodium (STOOL SOFTENER & LAXATIVE PO) Take 2 tablets by mouth at bedtime.    ? Syringe/Needle, Disp, (SYRINGE 3CC/22GX1-1/2") 22G X 1-1/2" 3 ML MISC Use to administer monthly b12 shots 50 each 0  ? ?No current facility-administered medications on file prior to visit.  ? ? ?Allergies  ?Allergen Reactions  ? Codeine Other (See Comments)  ?  REACTION: chest pain  ? Pioglitazone Other (See Comments)  ?  REACTION to Actos: swelling in ankles  ? ? ?Family History  ?Problem Relation Age of Onset  ? Cancer Mother   ?     Breast Cancer  ? Cancer Sister   ?     Breast  Cancer  ? Diabetes Father   ? Heart disease Father   ? Diabetes Paternal Grandfather   ? ? ?BP (!) 154/78 (BP Location: Left Arm, Patient Position: Sitting, Cuff Size: Normal)   Pulse 70   Ht '5\' 5"'$  (1.651 m)   Wt

## 2021-05-02 NOTE — Patient Instructions (Addendum)
Please reduce the NPH to 220 units at bedtime.   ?After any steroid injection, take 10 extra units for any blood sugar over 200.   ?check your blood sugar twice a day.  vary the time of day when you check, between before the 3 meals, and at bedtime.  also check if you have symptoms of your blood sugar being too high or too low.  please keep a record of the readings and bring it to your next appointment here (or you can bring the meter itself).  You can write it on any piece of paper.  please call us sooner if your blood sugar goes below 70, or if you have a lot of readings over 200.   ?You should have an endocrinology follow-up appointment in 3 months.   ? ?

## 2021-05-03 DIAGNOSIS — Z20822 Contact with and (suspected) exposure to covid-19: Secondary | ICD-10-CM | POA: Diagnosis not present

## 2021-05-08 ENCOUNTER — Telehealth: Payer: Medicare Other

## 2021-05-11 ENCOUNTER — Encounter: Payer: Self-pay | Admitting: *Deleted

## 2021-05-11 DIAGNOSIS — Z006 Encounter for examination for normal comparison and control in clinical research program: Secondary | ICD-10-CM

## 2021-05-11 NOTE — Research (Signed)
Message left for Mr Mcdermott about Essence. Encouraged him to call back. ?

## 2021-05-13 NOTE — Progress Notes (Signed)
?  ?  ? ? ? ?Date:  05/14/2021  ? ?Donald [name], DOB Sep 08, 1945, MRN [id number] ? ?Patient Location:  ?[address] ?[place] [address]  ? ?Provider location:   ?[place], [employer] office ? ?PCP:  Ria Bush, MD  ?Cardiologist:  [facility] ? ? ?Chief Complaint  ?Patient presents with  ? Other  ?  6 month follow up -- Meds reviewed verbally with patient.   ? ? ?History of Present Illness:   ? ?Donald [name] is a 76 y.o. male  past medical history of ?Long-standing diabetes, HBA1C 7.7 ?hyperlipidemia,  ?coronary artery disease,  ?Prior cardiac catheterization 2012 with 40% coronary disease noted ?Diffuse three-vessel coronary calcification on CT scan ?Cath 10/22: nonobstructive ?PAD,   ?chronic kidney disease, ?hypercalcemia,   ?OSA on CPAP,  ?Surgery of his parathyroid ?Chronic back pain ?Who presents for follow-up of his coronary artery disease, Paroxysmal afib, aortic valve stenosis, syncope October 2022 ? ?LOV 10/2020, had episode of syncope at that time ? ?A1C 7.3 ?Having chronic knee pain, received knee shots cortisone, gel shots x2 ?No regular exercise program ? ?Doing well in general ?Denies any tachypalpitations concerning for arrhythmia ?No chest pain, no significant shortness of breath ?No near-syncope or syncope ? ?Eliquis too expensive, took himself off the pill, taking aspirin alone for A-fib ?Doing patient assistance for eliquis ? ?Labs reviewed ?CR 1.68, BUN 35 ? ?EKG personally reviewed by myself on todays visit ?Normal sinus rhythm rate 58 bpm no significant ST-T wave changes ? ?Prior hospitalization October 2022 for, Syncope , found on his garage floor ?Low potassium 2.9 , low sugar also noted to be in atrial fibrillation ? ?Cardiac catheterization in October 2022 ?Mild, non-obstructive coronary artery disease, as detailed below.  No significant stenosis identified to explain syncope or cardiomyopathy. ?Normal left ventricular filling  pressure. ?Mild-moderate aortic valve stenosis based on peak-to-peak gradient (~20 mmHg). ? ?Echo showed EF of 45 to 50% with global hypokinesis, grade III diastolic dysfunction.  ? ?Carotid ultrasound less than 50% stenosis bilaterally.  ?MRI with no acute intracranial findings ? ?Other past medical history reviewed ?Abnormal EKG leading to stress testing on prior clinic visit ?This showed no significant ischemia ?  ?Had parathyroid, surgery ?Now with vertigo Following surgery ?Mild improvement with meclizine, though still symptomatic ?  ? previous PET/CT imaging  ? mild to moderate calcified plaque in the carotid arteries right greater than left ?Mild to moderate plaque in the aortic arch ?Diffuse three-vessel coronary calcifications ?He does have mild to moderate plaquing in the branches off the mid to distal descending aorta  ?staghorn left renal calculus ?  ?Recent parathyroid spect scan  showed a focal abnormal sestamibi location suspicious for right inferior parathyroid adenoma. Nonspecific tracer localization within single normal-sized left axillary and right subpectoral lymph node.  ?  ?elective parathyroidectomy on 02/29/2016.  ? ? ?Past Medical History:  ?Diagnosis Date  ? (HFpEF) heart failure with preserved ejection fraction ([place])   ? a. 09/2018 Echo: EF 60-65%. PASP 69mHg. Mild-mod LAE. Mild MR/TR.  ? ANEMIA-IRON DEFICIENCY 07/26/2006  ? Anxiety   ? ASTHMA 07/26/2006  ? Asthma   ? Back pain, chronic   ? Bell's palsy   ? CKD (chronic kidney disease), stage III ([place]   ? COLONIC POLYPS, HX OF 07/26/2006  ? COVID-19 virus infection 01/2020  ? DEPRESSION 03/14/2009  ? DIABETES MELLITUS, TYPE II 07/26/2006  ? DEvangelineDISEASE, LUMBAR 10/05/2007  ? DVT 12/03/2007  ?  DYSLIPIDEMIA 04/13/2009  ? Dyspnea   ? when gets up and walks around and back is hurting really bad -only Shortness of breath  then  ? GERD 07/26/2006  ? Gout   ? Heart murmur   ? History of kidney stones   ? HYPERTENSION 07/26/2006  ? INSOMNIA 08/21/2007  ?  Neuropathy   ? Nonobstructive CAD (coronary artery disease)   ? a. 2012 Cath: no high grade stenosis; b. 2018 MV: No ischemia. Attenuation artifact.   ? OBSTRUCTIVE SLEEP APNEA 12/03/2007  ? Use C-PAP  ? PERIPHERAL NEUROPATHY 07/26/2006  ? Pernicious anemia 11/20/2006  ? PULMONARY EMBOLISM 10/05/2007  ? ?Past Surgical History:  ?Procedure Laterality Date  ? APPENDECTOMY  1968  ? [place], 06/14/2008  ? Dr. [facility]on [facility] (06[facility])  ? CARDIAC CATHETERIZATION    ? CARPAL TUNNEL RELEASE Right 10/2017  ? CATARACT EXTRACTION W/PHACO Left 05/30/2014  ? Procedure: CATARACT EXTRACTION PHACO AND INTRAOCULAR LENS PLACEMENT (IOC);  Surgeon: Estill Cotta, MD;  Location: [facility] ORS;  Service: Ophthalmology;  Laterality: Left;  [name] 01:20 ?AP% 23.7 ?CDE 31.86  ? COLONOSCOPY  10/2002  ? HP, SSA, TA, rpt 3 yrs ([place])  ? COLONOSCOPY  01/2005  ? diverticulosis rpt 5 yrs ([place])   ? IR NEPHROSTOMY PLACEMENT LEFT  10/19/2018  ? IR URETERAL STENT LEFT NEW ACCESS W/O SEP NEPHROSTOMY CATH  10/19/2018  ? LEFT HEART CATH AND CORONARY ANGIOGRAPHY N/A 11/01/2020  ? Procedure: LEFT HEART CATH AND CORONARY ANGIOGRAPHY;  Surgeon: Nelva Bush, MD;  Location: [place] [facility];  Service: Cardiovascular;  Laterality: N/A;  ? LITHOTRIPSY    ? X 2  ? NEPHROLITHOTOMY Left 10/19/2018  ? Procedure: NEPHROLITHOTOMY PERCUTANEOUS;  Surgeon: Franchot Gallo, MD;  Location: [facility];  Service: Urology;  Laterality: Left;  3 HRS  ? PARATHYROIDECTOMY Right 04/18/2016  ? PARATHYROIDECTOMY for cyst [name], MD)  ? SHOULDER ARTHROSCOPY W/ ROTATOR CUFF REPAIR Right   ? TONSILLECTOMY    ?  ? ?Current Meds  ?Medication Sig  ? albuterol (VENTOLIN HFA) 108 (90 Base) MCG/ACT inhaler Inhale 2 puffs into the lungs every 6 (six) hours as needed for wheezing or shortness of breath.  ? allopurinol (ZYLOPRIM) 300 MG tablet Take 1 tablet (300 mg total) by mouth daily.  ? ALPRAZolam (XANAX) 1 MG tablet TAKE 1 TABLET BY MOUTH AT BEDTIME IF NEEDED  FOR SLEEP.  ? aspirin 81 MG EC tablet Take 81 mg by mouth daily. Swallow whole.  ? Cholecalciferol (VITAMIN D3) 25 MCG (1000 UT) CAPS Take 1 capsule (1,000 Units total) by mouth daily.  ? colchicine (COLCRYS) 0.6 MG tablet Take 1 tablet (0.6 mg total) by mouth daily as needed (gout flare). On first day of gout flare may take 2 tablets at once.  ? Continuous Blood Gluc Sensor (FREESTYLE LIBRE 2 SENSOR) MISC 1 Device by Does not apply route every 14 (fourteen) days.  ? cyanocobalamin (,VITAMIN B-12,) 1000 MCG/ML injection INJECT 1ML INTRAMUSCULARLY EVERY 30 DAYS  ? cyclobenzaprine (FLEXERIL) 10 MG tablet Take 10 mg by mouth 3 (three) times daily as needed for muscle spasms.  ? doxazosin (CARDURA) 1 MG tablet Take 1 tablet (1 mg total) by mouth 2 (two) times daily.  ? fluticasone (FLONASE) 50 MCG/ACT nasal spray Place 2 sprays into both nostrils daily.  ? furosemide (LASIX) 80 MG tablet TAKE ONE TABLET (80 MG) BY MOUTH EVERY DAY take an extra lasix for swelling  ? gabapentin (NEURONTIN) 600 MG tablet TAKE  TWO TABLETS AT BEDTIME  ? glucose blood (GE100 BLOOD GLUCOSE TEST) test strip TEST FOUR TIMES DAILY  ? HYDROcodone-acetaminophen (NORCO) 10-325 MG tablet Take 1 tablet by mouth every 6 (six) hours as needed for moderate pain ((typically once to twice daily if needed)).   ? insulin NPH Human (NOVOLIN N) 100 UNIT/ML injection Inject 2.2 mLs (220 Units total) into the skin at bedtime.  ? insulin regular (NOVOLIN R RELION) 100 units/mL injection 3 times a day (just before each meal), 200-140-280 units, and syringes 4/day.  ? isosorbide mononitrate (IMDUR) 30 MG 24 hr tablet Take 1 tablet (30 mg total) by mouth daily.  ? losartan (COZAAR) 25 MG tablet Take 1 tablet (25 mg total) by mouth at bedtime.  ? lovastatin (MEVACOR) 40 MG tablet Take 80 mg by mouth at bedtime.  ? Naphazoline-Pheniramine (OPCON-A OP) Place 1 drop into both eyes 3 (three) times daily as needed (itchy eyes.).   ? ondansetron (ZOFRAN) 4 MG tablet Take 1  tablet (4 mg total) by mouth every 4 (four) hours as needed for nausea.  ? potassium chloride (KLOR-CON M) 10 MEQ tablet TAKE 2 TABLETS BY MOUTH TWICE DAILY  ? promethazine-dextromethorphan (PROMETHAZINE-DM) 6.25-15

## 2021-05-14 ENCOUNTER — Ambulatory Visit (INDEPENDENT_AMBULATORY_CARE_PROVIDER_SITE_OTHER): Payer: Medicare Other | Admitting: Cardiovascular Disease

## 2021-05-14 ENCOUNTER — Encounter: Payer: Self-pay | Admitting: Cardiovascular Disease

## 2021-05-14 VITALS — BP 136/56 | HR 58 | Ht 65.0 in | Wt 256.0 lb

## 2021-05-14 DIAGNOSIS — R55 Syncope and collapse: Secondary | ICD-10-CM

## 2021-05-14 DIAGNOSIS — I10 Essential (primary) hypertension: Secondary | ICD-10-CM

## 2021-05-14 DIAGNOSIS — I5032 Chronic diastolic (congestive) heart failure: Secondary | ICD-10-CM | POA: Diagnosis not present

## 2021-05-14 DIAGNOSIS — N183 Chronic kidney disease, stage 3 unspecified: Secondary | ICD-10-CM | POA: Diagnosis not present

## 2021-05-14 DIAGNOSIS — I25118 Atherosclerotic heart disease of native coronary artery with other forms of angina pectoris: Secondary | ICD-10-CM

## 2021-05-14 DIAGNOSIS — E782 Mixed hyperlipidemia: Secondary | ICD-10-CM

## 2021-05-14 DIAGNOSIS — I6523 Occlusion and stenosis of bilateral carotid arteries: Secondary | ICD-10-CM | POA: Diagnosis not present

## 2021-05-14 DIAGNOSIS — I739 Peripheral vascular disease, unspecified: Secondary | ICD-10-CM | POA: Diagnosis not present

## 2021-05-14 DIAGNOSIS — I420 Dilated cardiomyopathy: Secondary | ICD-10-CM

## 2021-05-14 NOTE — Patient Instructions (Addendum)
Medication Instructions:  ?No changes ? ?Check the price of pradaxa ?Call if you would like to start warfarin ? ?If you need a refill on your cardiac medications before your next appointment, please call your pharmacy.  ? ?Lab work: ?No new labs needed ? ?Testing/Procedures: ?No new testing needed ? ?Follow-Up: ?At [facility], you and your health needs are our priority.  As part of our continuing mission to provide you with exceptional heart care, we have created designated Provider Care Teams.  These Care Teams include your primary Cardiologist (physician) and Advanced Practice Providers (APPs -  Physician Assistants and Nurse Practitioners) who all work together to provide you with the care you need, when you need it. ? ?You will need a follow up appointment in 6 months ? ?Providers on your designated Care Team:   ?Murray Hodgkins, NP ?Christell Faith, PA-C ?Cadence Kathlen Mody, PA-C ? ?COVID-19 Vaccine Information can be found at: [email or web address].uk For questions related to vaccine distribution or appointments, please email [email or web address] or call [phone].  ? ?

## 2021-05-16 DIAGNOSIS — Z20822 Contact with and (suspected) exposure to covid-19: Secondary | ICD-10-CM | POA: Diagnosis not present

## 2021-05-19 DIAGNOSIS — Z20822 Contact with and (suspected) exposure to covid-19: Secondary | ICD-10-CM | POA: Diagnosis not present

## 2021-05-26 DIAGNOSIS — M5416 Radiculopathy, lumbar region: Secondary | ICD-10-CM | POA: Diagnosis not present

## 2021-05-26 DIAGNOSIS — M5412 Radiculopathy, cervical region: Secondary | ICD-10-CM | POA: Diagnosis not present

## 2021-05-26 DIAGNOSIS — Z79891 Long term (current) use of opiate analgesic: Secondary | ICD-10-CM | POA: Diagnosis not present

## 2021-05-26 DIAGNOSIS — M5136 Other intervertebral disc degeneration, lumbar region: Secondary | ICD-10-CM | POA: Diagnosis not present

## 2021-05-28 ENCOUNTER — Other Ambulatory Visit: Payer: Self-pay | Admitting: Endocrinology

## 2021-05-28 DIAGNOSIS — Z794 Long term (current) use of insulin: Secondary | ICD-10-CM

## 2021-06-05 DIAGNOSIS — N1831 Chronic kidney disease, stage 3a: Secondary | ICD-10-CM | POA: Diagnosis not present

## 2021-06-06 ENCOUNTER — Ambulatory Visit (INDEPENDENT_AMBULATORY_CARE_PROVIDER_SITE_OTHER): Payer: Medicare Other | Admitting: Podiatry

## 2021-06-06 DIAGNOSIS — M19072 Primary osteoarthritis, left ankle and foot: Secondary | ICD-10-CM

## 2021-06-06 NOTE — Progress Notes (Signed)
  Subjective:  Patient ID: Donald [name], male    DOB: 03-16-1945,  MRN: [id number]  Arthritis in left foot interested in injection  76 y.o. male presents with the above complaint. History confirmed with patient.  Flared up about a week ago and is very painful.  The last injection he got from Dr. Posey Pronto lasted almost 6 months  Objective:  Physical Exam: warm, good capillary refill, no trophic changes or ulcerative lesions, normal DP and PT pulses, normal sensory exam, and pain over the tarsometatarsal joints laterally  Assessment:   1. Osteoarthritis of left midfoot      Plan:  Patient was evaluated and treated and all questions answered.  Discussed treatment options for his osteoarthritis he has responded well to injection therapy so far.  Following sterile prep with Betadine and utilizing aseptic technique 20 mg Kenalog was injected into the fourth tarsometatarsal joint space from a dorsal approach.  He tolerated as well as dressed with a Band-Aid.  Return as needed for further injections.  Return if symptoms worsen or fail to improve.

## 2021-06-13 ENCOUNTER — Other Ambulatory Visit: Payer: Self-pay | Admitting: Family Medicine

## 2021-06-14 HISTORY — PX: EPIDURAL BLOCK INJECTION: SHX1516

## 2021-06-15 NOTE — Telephone Encounter (Signed)
Vit B12 Last filled: 05/15/21, #1 mL Last OV:  01/24/21, 2 mo f/u Next OV:  06/26/21, CPE

## 2021-06-16 ENCOUNTER — Other Ambulatory Visit: Payer: Self-pay | Admitting: Family Medicine

## 2021-06-16 DIAGNOSIS — N183 Chronic kidney disease, stage 3 unspecified: Secondary | ICD-10-CM

## 2021-06-16 DIAGNOSIS — E559 Vitamin D deficiency, unspecified: Secondary | ICD-10-CM

## 2021-06-16 DIAGNOSIS — E538 Deficiency of other specified B group vitamins: Secondary | ICD-10-CM

## 2021-06-16 DIAGNOSIS — D509 Iron deficiency anemia, unspecified: Secondary | ICD-10-CM

## 2021-06-16 DIAGNOSIS — Z794 Long term (current) use of insulin: Secondary | ICD-10-CM

## 2021-06-16 DIAGNOSIS — M1A9XX0 Chronic gout, unspecified, without tophus (tophi): Secondary | ICD-10-CM

## 2021-06-16 DIAGNOSIS — Z125 Encounter for screening for malignant neoplasm of prostate: Secondary | ICD-10-CM

## 2021-06-16 DIAGNOSIS — E1169 Type 2 diabetes mellitus with other specified complication: Secondary | ICD-10-CM

## 2021-06-16 DIAGNOSIS — E1122 Type 2 diabetes mellitus with diabetic chronic kidney disease: Secondary | ICD-10-CM

## 2021-06-18 ENCOUNTER — Other Ambulatory Visit: Payer: Self-pay | Admitting: Family Medicine

## 2021-06-18 NOTE — Telephone Encounter (Signed)
Last filled 04-16-21 #30 Last OV 01-17-21 Next OV 06-26-21 [facility]

## 2021-06-19 NOTE — Telephone Encounter (Signed)
ERx 

## 2021-06-20 ENCOUNTER — Other Ambulatory Visit (INDEPENDENT_AMBULATORY_CARE_PROVIDER_SITE_OTHER): Payer: Medicare Other

## 2021-06-20 DIAGNOSIS — E1169 Type 2 diabetes mellitus with other specified complication: Secondary | ICD-10-CM

## 2021-06-20 DIAGNOSIS — Z794 Long term (current) use of insulin: Secondary | ICD-10-CM

## 2021-06-20 DIAGNOSIS — E559 Vitamin D deficiency, unspecified: Secondary | ICD-10-CM

## 2021-06-20 DIAGNOSIS — Z125 Encounter for screening for malignant neoplasm of prostate: Secondary | ICD-10-CM

## 2021-06-20 DIAGNOSIS — D509 Iron deficiency anemia, unspecified: Secondary | ICD-10-CM | POA: Diagnosis not present

## 2021-06-20 DIAGNOSIS — M1A9XX0 Chronic gout, unspecified, without tophus (tophi): Secondary | ICD-10-CM | POA: Diagnosis not present

## 2021-06-20 DIAGNOSIS — E785 Hyperlipidemia, unspecified: Secondary | ICD-10-CM

## 2021-06-20 DIAGNOSIS — N1832 Chronic kidney disease, stage 3b: Secondary | ICD-10-CM

## 2021-06-20 DIAGNOSIS — E1122 Type 2 diabetes mellitus with diabetic chronic kidney disease: Secondary | ICD-10-CM | POA: Diagnosis not present

## 2021-06-20 DIAGNOSIS — E538 Deficiency of other specified B group vitamins: Secondary | ICD-10-CM

## 2021-06-20 DIAGNOSIS — N183 Chronic kidney disease, stage 3 unspecified: Secondary | ICD-10-CM

## 2021-06-20 LAB — LIPID PANEL
Cholesterol: 105 mg/dL (ref 0–200)
HDL: 36.3 mg/dL — ABNORMAL LOW (ref >39.00)
LDL Cholesterol: 48 mg/dL (ref 0–99)
NonHDL: 68.31
Total CHOL/HDL Ratio: 3
Triglycerides: 104 mg/dL (ref 0.0–149.0)
VLDL: 20.8 mg/dL (ref 0.0–40.0)

## 2021-06-20 LAB — URIC ACID: Uric Acid, Serum: 4.8 mg/dL (ref 4.0–7.8)

## 2021-06-20 LAB — COMPREHENSIVE METABOLIC PANEL
ALT: 14 U/L (ref 0–53)
AST: 14 U/L (ref 0–37)
Albumin: 4.1 g/dL (ref 3.5–5.2)
Alkaline Phosphatase: 91 U/L (ref 39–117)
BUN: 30 mg/dL — ABNORMAL HIGH (ref 6–23)
CO2: 29 mEq/L (ref 19–32)
Calcium: 9.2 mg/dL (ref 8.4–10.5)
Chloride: 103 mEq/L (ref 96–112)
Creatinine, Ser: 1.45 mg/dL (ref 0.40–1.50)
GFR: 47.82 mL/min — ABNORMAL LOW (ref >60.00)
Glucose, Bld: 120 mg/dL — ABNORMAL HIGH (ref 70–99)
Potassium: 4.3 mEq/L (ref 3.5–5.1)
Sodium: 142 mEq/L (ref 135–145)
Total Bilirubin: 1.3 mg/dL — ABNORMAL HIGH (ref 0.2–1.2)
Total Protein: 6.1 g/dL (ref 6.0–8.3)

## 2021-06-20 LAB — VITAMIN D 25 HYDROXY (VIT D DEFICIENCY, FRACTURES): VITD: 55.84 ng/mL (ref 30.00–100.00)

## 2021-06-20 LAB — IBC PANEL
Iron: 61 ug/dL (ref 42–165)
Saturation Ratios: 18.9 % — ABNORMAL LOW (ref 20.0–50.0)
TIBC: 323.4 ug/dL (ref 250.0–450.0)
Transferrin: 231 mg/dL (ref 212.0–360.0)

## 2021-06-20 LAB — VITAMIN B12: Vitamin B-12: 560 pg/mL (ref 211–911)

## 2021-06-20 LAB — FERRITIN: Ferritin: 43.4 ng/mL (ref 22.0–322.0)

## 2021-06-20 LAB — HEMOGLOBIN A1C: Hgb A1c MFr Bld: 7.5 % — ABNORMAL HIGH (ref 4.6–6.5)

## 2021-06-20 LAB — PSA, MEDICARE: PSA: 0.92 ng/ml (ref 0.10–4.00)

## 2021-06-20 NOTE — Addendum Note (Signed)
Addended by: Ellamae Sia on: 06/20/2021 11:26 AM   Modules accepted: Orders

## 2021-06-21 DIAGNOSIS — M5416 Radiculopathy, lumbar region: Secondary | ICD-10-CM | POA: Diagnosis not present

## 2021-06-21 LAB — PARATHYROID HORMONE, INTACT (NO CA): PTH: 83 pg/mL — ABNORMAL HIGH (ref 16–77)

## 2021-06-26 ENCOUNTER — Encounter: Payer: Self-pay | Admitting: Family Medicine

## 2021-06-26 ENCOUNTER — Telehealth: Payer: Self-pay | Admitting: Cardiovascular Disease

## 2021-06-26 ENCOUNTER — Ambulatory Visit (INDEPENDENT_AMBULATORY_CARE_PROVIDER_SITE_OTHER): Payer: Medicare Other | Admitting: Family Medicine

## 2021-06-26 VITALS — BP 134/62 | HR 53 | Temp 97.2°F | Ht 63.5 in | Wt 252.4 lb

## 2021-06-26 DIAGNOSIS — E1142 Type 2 diabetes mellitus with diabetic polyneuropathy: Secondary | ICD-10-CM

## 2021-06-26 DIAGNOSIS — D510 Vitamin B12 deficiency anemia due to intrinsic factor deficiency: Secondary | ICD-10-CM | POA: Diagnosis not present

## 2021-06-26 DIAGNOSIS — N200 Calculus of kidney: Secondary | ICD-10-CM

## 2021-06-26 DIAGNOSIS — N1832 Chronic kidney disease, stage 3b: Secondary | ICD-10-CM

## 2021-06-26 DIAGNOSIS — Z794 Long term (current) use of insulin: Secondary | ICD-10-CM

## 2021-06-26 DIAGNOSIS — G4733 Obstructive sleep apnea (adult) (pediatric): Secondary | ICD-10-CM | POA: Diagnosis not present

## 2021-06-26 DIAGNOSIS — E1122 Type 2 diabetes mellitus with diabetic chronic kidney disease: Secondary | ICD-10-CM | POA: Diagnosis not present

## 2021-06-26 DIAGNOSIS — I6523 Occlusion and stenosis of bilateral carotid arteries: Secondary | ICD-10-CM | POA: Diagnosis not present

## 2021-06-26 DIAGNOSIS — E1169 Type 2 diabetes mellitus with other specified complication: Secondary | ICD-10-CM | POA: Diagnosis not present

## 2021-06-26 DIAGNOSIS — Z0000 Encounter for general adult medical examination without abnormal findings: Secondary | ICD-10-CM | POA: Diagnosis not present

## 2021-06-26 DIAGNOSIS — I10 Essential (primary) hypertension: Secondary | ICD-10-CM | POA: Diagnosis not present

## 2021-06-26 DIAGNOSIS — I5032 Chronic diastolic (congestive) heart failure: Secondary | ICD-10-CM

## 2021-06-26 DIAGNOSIS — Z86718 Personal history of other venous thrombosis and embolism: Secondary | ICD-10-CM

## 2021-06-26 DIAGNOSIS — E785 Hyperlipidemia, unspecified: Secondary | ICD-10-CM

## 2021-06-26 DIAGNOSIS — N183 Chronic kidney disease, stage 3 unspecified: Secondary | ICD-10-CM

## 2021-06-26 DIAGNOSIS — Z7189 Other specified counseling: Secondary | ICD-10-CM | POA: Diagnosis not present

## 2021-06-26 DIAGNOSIS — E559 Vitamin D deficiency, unspecified: Secondary | ICD-10-CM

## 2021-06-26 DIAGNOSIS — I480 Paroxysmal atrial fibrillation: Secondary | ICD-10-CM

## 2021-06-26 DIAGNOSIS — I739 Peripheral vascular disease, unspecified: Secondary | ICD-10-CM

## 2021-06-26 DIAGNOSIS — D509 Iron deficiency anemia, unspecified: Secondary | ICD-10-CM

## 2021-06-26 DIAGNOSIS — E538 Deficiency of other specified B group vitamins: Secondary | ICD-10-CM

## 2021-06-26 DIAGNOSIS — I25118 Atherosclerotic heart disease of native coronary artery with other forms of angina pectoris: Secondary | ICD-10-CM

## 2021-06-26 DIAGNOSIS — M1A9XX0 Chronic gout, unspecified, without tophus (tophi): Secondary | ICD-10-CM

## 2021-06-26 NOTE — Assessment & Plan Note (Signed)
Advanced directive discussion - has at home. HCPOA would be wife. Asked to bring [name] a copy.

## 2021-06-26 NOTE — Telephone Encounter (Signed)
Spoke w/ pt. Advised him that the alternatives to Eliquis. He will check w/ his ins about the cost of Xarelto, as his wife pays ~$40/mo for hers. He is willing to try warfarin if Xarelto is unaffordable. He will call back to let us know.

## 2021-06-26 NOTE — Assessment & Plan Note (Signed)

## 2021-06-26 NOTE — Telephone Encounter (Signed)
Pt c/o medication issue:  1. Name of Medication: apixaban (ELIQUIS) 5 MG TABS tablet  2. How are you currently taking this medication (dosage and times per day)? Take 1 tablet (5 mg total) by mouth 2 (two) times daily.Patient not taking: Reported on 05/14/2021  3. Are you having a reaction (difficulty breathing--STAT)? no  4. What is your medication issue? Patient cant no longer afford this medication because its too expensive. Please advise

## 2021-06-26 NOTE — Progress Notes (Unsigned)
Patient ID: Donald [name], male    DOB: Oct 15, 1945, 76 y.o.   MRN: [id number]  This visit was conducted in person.  BP 134/62   Pulse (!) 53   Temp (!) 97.2 F (36.2 C) (Temporal)   Ht 5' 3.5" (1.613 m)   Wt 252 lb 6 oz (114.5 kg)   SpO2 96%   BMI 44.00 kg/m    CC: AMW Subjective:   HPI: Donald [name] is a 76 y.o. male presenting on 06/26/2021 for Medicare Wellness   Did not see health advisor this year.   Hearing Screening   '500Hz'$  '1000Hz'$  '2000Hz'$  '4000Hz'$   Right ear 25 25 40 0  Left ear 25 0 0 0  Comments: Pt wears bilateral hearing aids. Not wearing at today's OV.   Vision Screening   Right eye Left eye Both eyes  Without correction     With correction '20/25 20/20 20/20 '$    Flowsheet Row Office Visit from 06/26/2021 in [place] at [facility] Total Score 3          06/26/2021   11:42 AM 06/21/2020    2:12 PM 04/20/2019    2:05 PM 03/20/2018    3:08 PM 05/20/2014    9:58 AM  Fall Risk   Falls in the past year? 0 0 0 1 No  Number falls in past yr:   0 0   Injury with Fall?   0 0   Risk for fall due to :   Medication side effect    Follow up   Falls evaluation completed;Falls prevention discussed     Saw Dr Rockey Situ last month, note reviewed. Known pAFib and HFpEF.  Amlodipine changed to cardura due to leg swelling.  Currently off eliquis due to affordability. He is working with PAP to afford this. He continues aspirin 6mg daily.   DM - saw Dr ELoanne Drillinguntil he retired - continues NPH 220 u nightly along with novolin R 200/140/280u daily. Continues using Freestyle Libre 2 CGM. Hypoglycemic episode yesterday while in grocery store, didn't check sugar at that time.  Lab Results  Component Value Date   HGBA1C 7.5 (H) 06/20/2021    Care Team: Cardiology - Dr GRockey SituEndocrinology - Dr ELoanne Drillingretired --> planning to establish with new provider Nephrology - Dr CMarval RegalUrology - Dr DDiona Fanti(kidney stones) PM&R - Dr RNelva Bush ENT - Dr  VPryor OchoaPodiatry - Dr PWynona Luna- Dr DEvorn Gong Sleep - Dr AElsworth Soho  Preventative: Colon cancer screening - colonoscopy 2006 with hyperplastic polyps ([facility]). Cologuard normal 01/2016, 09/2020.  Prostate cancer screening - yearly PSA. no BPH symptoms.  Lung cancer screening - not eligible  Flu shot - yearly CEctor2/2021 x2, Pfizer booster 10/2019  Td 04/2008  Pneumovax 2009, 2004, 2020. Prevnar-13 2016 Zostavax - received twice 2011, 2013 Shingrix - discussed, to check at pharmacy  Advanced directive discussion - has at home. HCPOA would be wife. Asked to bring [place] copy.  Seat belt use discussed.  Sunscreen use discussed. No changing moles on skin. 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 Activity: limited by medical problems Diet: good water,  fruits/vegetables daily     Relevant past medical, surgical, family and social history reviewed and updated as indicated. Interim medical history since our last visit reviewed. Allergies and medications reviewed and updated. Outpatient Medications Prior to Visit  Medication Sig Dispense Refill   albuterol (VENTOLIN HFA) 108 (90 Base) MCG/ACT inhaler Inhale 2 puffs into the lungs every 6 (six) hours as needed for wheezing or shortness of breath. 8 g 0   allopurinol (ZYLOPRIM) 300 MG tablet Take 1 tablet (300 mg total) by mouth daily. 90 tablet 3   ALPRAZolam (XANAX) 1 MG tablet TAKE 1 TABLET BY MOUTH AT BEDTIME IF NEEDED FOR SLEEP. 30 tablet 0   aspirin 81 MG EC tablet Take 81 mg by mouth daily. Swallow whole.     Cholecalciferol (VITAMIN D3) 25 MCG (1000 UT) CAPS Take 1 capsule (1,000 Units total) by mouth daily. 30 capsule     colchicine (COLCRYS) 0.6 MG tablet Take 1 tablet (0.6 mg total) by mouth daily as needed (gout flare). On first day of gout flare may take 2 tablets at once. 30 tablet 4   Continuous Blood Gluc Sensor (FREESTYLE LIBRE 2 SENSOR) MISC 1 Device by Does not apply route every 14 (fourteen) days. 6 each 3   cyanocobalamin (,VITAMIN B-12,) 1000 MCG/ML injection INJECT 1ML INTRAMUSCULARLY EVERY 30 DAYS 1 mL 3   cyclobenzaprine (FLEXERIL) 10 MG tablet Take 10 mg by mouth 3 (three) times daily as needed for muscle spasms.     doxazosin (CARDURA) 1 MG tablet Take 1 tablet (1 mg total) by mouth 2 (two) times daily. 180 tablet 3   fluticasone (FLONASE) 50 MCG/ACT nasal spray Place 2 sprays into both nostrils daily. 16 g 1   furosemide (LASIX) 80 MG tablet TAKE ONE TABLET (80 MG) BY MOUTH EVERY DAY take an extra lasix for swelling 100 tablet 3   gabapentin (NEURONTIN) 600 MG tablet TAKE TWO TABLETS AT BEDTIME 180 tablet 3   glucose blood (GE100 BLOOD GLUCOSE TEST) test strip TEST FOUR TIMES DAILY 200 each 1   HYDROcodone-acetaminophen (NORCO) 10-325 MG tablet Take 1 tablet by mouth every 6 (six) hours as needed for moderate pain ((typically once to twice daily if needed)).  30 tablet 0   insulin NPH Human (NOVOLIN N) 100 UNIT/ML injection Inject 2.2 mLs (220 Units total) into the skin at bedtime. 220 mL 1   isosorbide mononitrate (IMDUR) 30 MG 24 hr tablet Take 1 tablet (30 mg total) by mouth daily. 90 tablet 3   losartan (COZAAR) 25 MG tablet Take 1 tablet (25 mg total) by mouth at bedtime. 90 tablet 3   lovastatin (MEVACOR) 40 MG tablet Take 80 mg by mouth at bedtime.     Naphazoline-Pheniramine (OPCON-A OP) Place 1 drop into both eyes 3 (three) times daily as needed (itchy eyes.).      NOVOLIN R 100 UNIT/ML injection INJECT 200 UNITS IN THE MORNING, 140 UNITS IN THE AFTERNOON, AND 280 UNITS IN THE EVENING. USE BEFORE MEAL 210 mL 0   ondansetron (ZOFRAN) 4 MG tablet Take 1 tablet (4 mg total) by mouth every 4  (four) hours as needed for nausea. 10 tablet 0   potassium citrate (UROCIT-K) 10 MEQ (1080 MG) SR tablet Take 1 tablet (10 mEq total) by mouth 3 (three) times daily with meals.     promethazine-dextromethorphan (PROMETHAZINE-DM) 6.25-15 MG/5ML syrup Take 5 mLs by mouth 4 (four) times daily as needed. 118 mL 0   Sennosides-Docusate Sodium (STOOL SOFTENER & LAXATIVE  PO) Take 2 tablets by mouth at bedtime.     Syringe/Needle, Disp, (SYRINGE 3CC/22GX1-1/2") 22G X 1-1/2" 3 ML MISC Use to administer monthly b12 shots 50 each 0   potassium chloride (KLOR-CON M) 10 MEQ tablet TAKE 2 TABLETS BY MOUTH TWICE DAILY 360 tablet 0   apixaban (ELIQUIS) 5 MG TABS tablet Take 1 tablet (5 mg total) by mouth 2 (two) times daily. (Patient not taking: Reported on 05/14/2021) 180 tablet 3   No facility-administered medications prior to visit.     Per HPI unless specifically indicated in ROS section below Review of Systems  Objective:  BP 134/62   Pulse (!) 53   Temp (!) 97.2 F (36.2 C) (Temporal)   Ht 5' 3.5" (1.613 m)   Wt 252 lb 6 oz (114.5 kg)   SpO2 96%   BMI 44.00 kg/m   Wt Readings from Last 3 Encounters:  06/26/21 252 lb 6 oz (114.5 kg)  05/14/21 256 lb (116.1 kg)  05/02/21 262 lb 12.8 oz (119.2 kg)      Physical Exam Vitals and nursing note reviewed.  Constitutional:      General: He is not in acute distress.    Appearance: Normal appearance. He is well-developed. He is not ill-appearing.  HENT:     Head: Normocephalic and atraumatic.     Right Ear: Hearing, tympanic membrane, ear canal and external ear normal.     Left Ear: Hearing, tympanic membrane, ear canal and external ear normal.  Eyes:     General: No scleral icterus.    Extraocular Movements: Extraocular movements intact.     Conjunctiva/sclera: Conjunctivae normal.     Pupils: Pupils are equal, round, and reactive to light.  Neck:     Thyroid: No thyroid mass or thyromegaly.     Vascular: No carotid bruit.  Cardiovascular:      Rate and Rhythm: Normal rate and regular rhythm.     Pulses: Normal pulses.          Radial pulses are 2+ on the right side and 2+ on the left side.     Heart sounds: Normal heart sounds. No murmur heard. Pulmonary:     Effort: Pulmonary effort is normal. No respiratory distress.     Breath sounds: Normal breath sounds. No wheezing, rhonchi or rales.  Abdominal:     General: Bowel sounds are normal. There is no distension.     Palpations: Abdomen is soft. There is no mass.     Tenderness: There is no abdominal tenderness. There is no guarding or rebound.     Hernia: No hernia is present.  Musculoskeletal:        General: Normal range of motion.     Cervical back: Normal range of motion and neck supple.     Right lower leg: No edema.     Left lower leg: No edema.  Lymphadenopathy:     Cervical: No cervical adenopathy.  Skin:    General: Skin is warm and dry.     Findings: No rash.  Neurological:     General: No focal deficit present.     Mental Status: He is alert and oriented to person, place, and time.     Comments:  Recall 3/3 Calculation 5/5 DLROW  Psychiatric:        Mood and Affect: Mood normal.        Behavior: Behavior normal.        Thought Content: Thought content normal.  Judgment: Judgment normal.       Results for orders placed or performed in visit on 06/20/21  Parathyroid hormone, intact (no Ca)  Result Value Ref Range   PTH 83 (H) 16 - 77 pg/mL  Vitamin B12  Result Value Ref Range   Vitamin B-12 560 211 - 911 pg/mL  Ferritin  Result Value Ref Range   Ferritin 43.4 22.0 - 322.0 ng/mL  IBC panel  Result Value Ref Range   Iron 61 42 - 165 ug/dL   Transferrin 231.0 212.0 - 360.0 mg/dL   Saturation Ratios 18.9 (L) 20.0 - 50.0 %   TIBC 323.4 250.0 - 450.0 mcg/dL  VITAMIN D 25 Hydroxy (Vit-D Deficiency, Fractures)  Result Value Ref Range   VITD 55.84 30.00 - 100.00 ng/mL  Lipid panel  Result Value Ref Range   Cholesterol 105 0 - 200 mg/dL    Triglycerides 104.0 0.0 - 149.0 mg/dL   HDL 36.30 (L) >39.00 mg/dL   VLDL 20.8 0.0 - 40.0 mg/dL   LDL Cholesterol 48 0 - 99 mg/dL   Total CHOL/HDL Ratio 3    NonHDL 68.31   Comprehensive metabolic panel  Result Value Ref Range   Sodium 142 135 - 145 mEq/L   Potassium 4.3 3.5 - 5.1 mEq/L   Chloride 103 96 - 112 mEq/L   CO2 29 19 - 32 mEq/L   Glucose, Bld 120 (H) 70 - 99 mg/dL   BUN 30 (H) 6 - 23 mg/dL   Creatinine, Ser 1.45 0.40 - 1.50 mg/dL   Total Bilirubin 1.3 (H) 0.2 - 1.2 mg/dL   Alkaline Phosphatase 91 39 - 117 U/L   AST 14 0 - 37 U/L   ALT 14 0 - 53 U/L   Total Protein 6.1 6.0 - 8.3 g/dL   Albumin 4.1 3.5 - 5.2 g/dL   GFR 47.82 (L) >60.00 mL/min   Calcium 9.2 8.4 - 10.5 mg/dL  Hemoglobin A1c  Result Value Ref Range   Hgb A1c MFr Bld 7.5 (H) 4.6 - 6.5 %  PSA, Medicare  Result Value Ref Range   PSA 0.92 0.10 - 4.00 ng/ml  Uric acid  Result Value Ref Range   Uric Acid, Serum 4.8 4.0 - 7.8 mg/dL    Assessment & Plan:   Problem List Items Addressed This Visit     Medicare annual wellness visit, subsequent (Chronic)    I have personally reviewed the Medicare Annual Wellness questionnaire and have noted 1. The patient's medical and social history 2. Their use of alcohol, tobacco or illicit drugs 3. Their current medications and supplements 4. The patient's functional ability including ADL's, fall risks, home safety risks and hearing or visual impairment. Cognitive function has been assessed and addressed as indicated.  5. Diet and physical activity 6. Evidence for depression or mood disorders The patients weight, height, BMI have been recorded in the chart. I have made referrals, counseling and provided education to the patient based on review of the above and I have provided the pt with a written personalized care plan for preventive services. Provider list updated.. See scanned questionairre as needed for further documentation. Reviewed preventative protocols and  updated unless pt declined.       Advanced care planning/counseling discussion (Chronic)    Advanced directive discussion - has at home. HCPOA would be wife. Asked to bring [name] a copy.         No orders of the defined types were placed in this encounter.  No  orders of the defined types were placed in this encounter.    Patient instructions: Call [facility] to ask about scheduling follow up appointment with one of the remaining providers.  Touch base with Dr Rockey Situ office about Eliquis alternatives. If interested, check with pharmacy about new 2 shot shingles series (shingrix).  Bring [name] a copy of your advanced directive to update your chart.  You are doing well today.  Return in 6 months for follow up visit with me.   Follow up plan: Return in about 6 months (around 12/26/2021) for follow up visit.  Ria Bush, MD

## 2021-06-26 NOTE — Telephone Encounter (Signed)
Left message for pt to call back  °

## 2021-06-26 NOTE — Patient Instructions (Addendum)
Call [facility] to ask about scheduling follow up appointment with one of the remaining providers.  Touch base with Dr Rockey Situ office about Eliquis alternatives. If interested, check with pharmacy about new 2 shot shingles series (shingrix).  Bring [name] a copy of your advanced directive to update your chart.  You are doing well today.  Return in 6 months for follow up visit with me.   Health Maintenance After Age 74 After age 12, you are at a higher risk for certain long-term diseases and infections as well as injuries from falls. Falls are a major cause of broken bones and head injuries in people who are older than age 49. Getting regular preventive care can help to keep you healthy and well. Preventive care includes getting regular testing and making lifestyle changes as recommended by your health care provider. Talk with your health care provider about: Which screenings and tests you should have. A screening is a test that checks for a disease when you have no symptoms. A diet and exercise plan that is right for you. What should I know about screenings and tests to prevent falls? Screening and testing are the best ways to find a health problem early. Early diagnosis and treatment give you the best chance of managing medical conditions that are common after age 20. Certain conditions and lifestyle choices may make you more likely to have a fall. Your health care provider may recommend: Regular vision checks. Poor vision and conditions such as cataracts can make you more likely to have a fall. If you wear glasses, make sure to get your prescription updated if your vision changes. Medicine review. Work with your health care provider to regularly review all of the medicines you are taking, including over-the-counter medicines. Ask your health care provider about any side effects that may make you more likely to have a fall. Tell your health care provider if any medicines that you take make you feel dizzy or  sleepy. Strength and balance checks. Your health care provider may recommend certain tests to check your strength and balance while standing, walking, or changing positions. Foot health exam. Foot pain and numbness, as well as not wearing proper footwear, can make you more likely to have a fall. Screenings, including: Osteoporosis screening. Osteoporosis is a condition that causes the bones to get weaker and break more easily. Blood pressure screening. Blood pressure changes and medicines to control blood pressure can make you feel dizzy. Depression screening. You may be more likely to have a fall if you have a fear of falling, feel depressed, or feel unable to do activities that you used to do. Alcohol use screening. Using too much alcohol can affect your balance and may make you more likely to have a fall. Follow these instructions at home: Lifestyle Do not drink alcohol if: Your health care provider tells you not to drink. If you drink alcohol: Limit how much you have to: 0-1 drink a day for women. 0-2 drinks a day for men. Know how much alcohol is in your drink. In the [place] one drink equals one 12 oz bottle of beer (355 mL), one 5 oz glass of wine (148 mL), or one 1 oz glass of hard liquor (44 mL). Do not use any products that contain nicotine or tobacco. These products include cigarettes, chewing tobacco, and vaping devices, such as e-cigarettes. If you need help quitting, ask your health care provider. Activity  Follow a regular exercise program to stay fit. This will help  you maintain your balance. Ask your health care provider what types of exercise are appropriate for you. If you need a cane or walker, use it as recommended by your health care provider. Wear supportive shoes that have nonskid soles. Safety  Remove any tripping hazards, such as rugs, cords, and clutter. Install safety equipment such as grab bars in bathrooms and safety rails on stairs. Keep rooms and walkways  well-lit. General instructions Talk with your health care provider about your risks for falling. Tell your health care provider if: You fall. Be sure to tell your health care provider about all falls, even ones that seem minor. You feel dizzy, tiredness (fatigue), or off-balance. Take over-the-counter and prescription medicines only as told by your health care provider. These include supplements. Eat a healthy diet and maintain a healthy weight. A healthy diet includes low-fat dairy products, low-fat (lean) meats, and fiber from whole grains, beans, and lots of fruits and vegetables. Stay current with your vaccines. Schedule regular health, dental, and eye exams. Summary Having a healthy lifestyle and getting preventive care can help to protect your health and wellness after age 32. Screening and testing are the best way to find a health problem early and help you avoid having a fall. Early diagnosis and treatment give you the best chance for managing medical conditions that are more common for people who are older than age 17. Falls are a major cause of broken bones and head injuries in people who are older than age 74. Take precautions to prevent a fall at home. Work with your health care provider to learn what changes you can make to improve your health and wellness and to prevent falls. This information is not intended to replace advice given to you by your health care provider. Make sure you discuss any questions you have with your health care provider. Document Revised: 05/22/2020 Document Reviewed: 05/22/2020 Elsevier Patient Education  [place].

## 2021-06-27 ENCOUNTER — Encounter: Payer: Self-pay | Admitting: Family Medicine

## 2021-06-27 NOTE — Assessment & Plan Note (Signed)
Appreciate cardiology care, continue aspirin and statin.

## 2021-06-27 NOTE — Assessment & Plan Note (Signed)
Continue regular CPAP use 

## 2021-06-27 NOTE — Assessment & Plan Note (Signed)
Iron levels overall stable. Will need to verify if he continues oral iron.

## 2021-06-27 NOTE — Assessment & Plan Note (Signed)
Encouraged healthy diet to affect sustainable weight loss.

## 2021-06-27 NOTE — Assessment & Plan Note (Signed)
Continue 1000 units of vitamin D daily with good effect

## 2021-06-27 NOTE — Assessment & Plan Note (Signed)
Chronic, stable period on lovastatin '80mg'$  nightly. The ASCVD Risk score (Arnett DK, et al., 2019) failed to calculate for the following reasons:   The patient has a prior MI or stroke diagnosis

## 2021-06-27 NOTE — Assessment & Plan Note (Signed)
Sees nephrology, latest kidney function stable with GFR 48

## 2021-06-27 NOTE — Assessment & Plan Note (Addendum)
Chronic, followed by endocrinology.  Dr. Loanne Drilling has retired, he will touch base with [facility] to establish with new doc.  Continue NPH/Novolin R regimen.  Continue freestyle libre to use. He is also on gabapentin 1200 mg nightly.

## 2021-06-27 NOTE — Assessment & Plan Note (Signed)
Difficulty affording Eliquis as per above.  He is only on aspirin 81 mg daily. I asked him to touch base with cardiology to see if Xarelto would be a more affordable option.

## 2021-06-27 NOTE — Assessment & Plan Note (Signed)
Blood pressure stable on current regimen.

## 2021-06-27 NOTE — Assessment & Plan Note (Signed)
Continue B12 shots monthly.  He gets these at home.

## 2021-06-27 NOTE — Assessment & Plan Note (Signed)
Continue monthly B12 shots at home.  

## 2021-06-27 NOTE — Assessment & Plan Note (Signed)
Sees urology regularly ([place]). On potassium citrate, thinks 10 mEq 3 times daily.

## 2021-06-27 NOTE — Assessment & Plan Note (Signed)
Appreciate cardiology care seems euvolemic today.

## 2021-06-27 NOTE — Assessment & Plan Note (Signed)
Difficulty affording Eliquis- advised he touch base with cardiology about possible Xarelto.  In interim he will continue baby aspirin daily.

## 2021-06-27 NOTE — Assessment & Plan Note (Signed)
Uric acid stable, only on colchicine as needed

## 2021-06-28 ENCOUNTER — Other Ambulatory Visit: Payer: Self-pay | Admitting: Cardiovascular Disease

## 2021-06-28 ENCOUNTER — Telehealth: Payer: Self-pay | Admitting: Family Medicine

## 2021-06-28 NOTE — Telephone Encounter (Signed)
Noted. Please update chart. Thanks.

## 2021-06-28 NOTE — Telephone Encounter (Signed)
Pt was saw on 06/26/21 and was told to call back to let Dr Darnell Level know which potassium he was on. He said he is on potassium citrate '15mg'$  1 tablet 3 times a day

## 2021-06-29 NOTE — Telephone Encounter (Signed)
Chart updated

## 2021-07-03 ENCOUNTER — Ambulatory Visit (INDEPENDENT_AMBULATORY_CARE_PROVIDER_SITE_OTHER): Payer: Medicare Other | Admitting: Podiatry

## 2021-07-03 DIAGNOSIS — E113293 Type 2 diabetes mellitus with mild nonproliferative diabetic retinopathy without macular edema, bilateral: Secondary | ICD-10-CM | POA: Diagnosis not present

## 2021-07-03 DIAGNOSIS — M19072 Primary osteoarthritis, left ankle and foot: Secondary | ICD-10-CM | POA: Diagnosis not present

## 2021-07-03 DIAGNOSIS — Z961 Presence of intraocular lens: Secondary | ICD-10-CM | POA: Diagnosis not present

## 2021-07-03 DIAGNOSIS — H2511 Age-related nuclear cataract, right eye: Secondary | ICD-10-CM | POA: Diagnosis not present

## 2021-07-03 DIAGNOSIS — H25011 Cortical age-related cataract, right eye: Secondary | ICD-10-CM | POA: Diagnosis not present

## 2021-07-03 NOTE — Progress Notes (Signed)
  Subjective:  Patient ID: TRAVONTE BYARD, male    DOB: 06/07/1945,  MRN: [id number]  Arthritis in left foot interested in injection  76 y.o. male presents with the above complaint. History confirmed with patient.  Flared up about a week ago and is very painful.  The last injection that he got from Dr. Prudence Davidson did not last a long.  He states about 1 week.  He would like to know if he can do 1 more injection.  Objective:  Physical Exam: warm, good capillary refill, no trophic changes or ulcerative lesions, normal DP and PT pulses, normal sensory exam, and pain over the tarsometatarsal joints laterally  Assessment:   1. Osteoarthritis of left midfoot       Plan:  Patient was evaluated and treated and all questions answered.  Discussed treatment options for his osteoarthritis he has responded well to injection therapy so far.  Following sterile prep with Betadine and utilizing aseptic technique 20 mg Kenalog was injected into the fourth tarsometatarsal joint space from a dorsal approach.  He tolerated as well as dressed with a Band-Aid.  Return as needed for further injections.  No follow-ups on file.

## 2021-07-11 ENCOUNTER — Ambulatory Visit: Payer: Medicare Other | Admitting: Podiatry

## 2021-07-23 ENCOUNTER — Telehealth: Payer: Self-pay | Admitting: Family Medicine

## 2021-07-23 NOTE — Telephone Encounter (Signed)
Spoke with pt relaying Dr. Synthia Innocent message.  Pt verbalizes understanding and will contact their office.

## 2021-07-23 NOTE — Telephone Encounter (Signed)
Patient returned call and said that he would like [name] to fax a script for new sleep app machine to [facility]. Fax: [phone]  Patients call back if needed is [phone]

## 2021-07-23 NOTE — Telephone Encounter (Signed)
Spoke with pt asking if he has has f/u with Dr. Elsworth Soho or a recent sleep study.  Pt states he has not done neither one in yrs.  Should pt contact pulmonology to request rx for new CPAP?

## 2021-07-23 NOTE — Telephone Encounter (Addendum)
Yes-recommend he reach out to pulmonology Dr. Elsworth Soho for updated CPAP machine. [facility] in [place]: [phone]

## 2021-07-23 NOTE — Telephone Encounter (Signed)
Patient called and said [facility] went to [facility] about his sleep app machine and they said its old and that he should have his provider send in a script for a new one. Patient had to get off phone to take another call but said he would call back

## 2021-08-02 ENCOUNTER — Ambulatory Visit: Payer: Medicare Other | Admitting: Internal Medicine

## 2021-08-02 NOTE — Progress Notes (Deleted)
Name: Donald [name]  Age/ Sex: 76 y.o., male   MRN/ DOB: [id number], [birth year]     PCP: Donald Bush, MD   Reason for Endocrinology Evaluation: Type 2 Diabetes Mellitus  Initial Endocrine Consultative Visit: 12/24/2021    PATIENT IDENTIFIER: Donald [name] is a 76 y.o. male with a past medical history of HTN, CAD, PAD, CHF, PAF, T2DM, dyslipidemia, CKD. The patient has followed with Endocrinology clinic since 12/24/2021 for consultative assistance with management of his diabetes.  DIABETIC HISTORY:  Donald [name] was diagnosed with DM 1989, and started insulin therapy approximately in 1991. His hemoglobin A1c has ranged from 7.0% in 2022, peaking at 8.4% in 2019.  Mounjaro cost prohibitive 2022 SUBJECTIVE:   During the last visit (05/02/2021): Saw Donald [name]  Today (08/02/2021): Donald [name] is here for follow-up on diabetes management.  He checks his blood sugars *** times daily, preprandial to breakfast and ***. The patient has *** had hypoglycemic episodes since the last clinic visit, which typically occur *** x / - most often occuring ***. The patient is *** symptomatic with these episodes, with symptoms of {symptoms; hypoglycemia:[id number]}.     HOME DIABETES REGIMEN:  Novolin - N Novolin- R    Statin: Yes ACE-I/ARB: Yes   METER DOWNLOAD SUMMARY: Date range evaluated: *** Fingerstick Blood Glucose Tests = *** Average Number Tests/Day = *** Overall Mean FS Glucose = *** Standard Deviation = ***  BG Ranges: Low = *** High = ***   Hypoglycemic Events/30 Days: BG < 50 = *** Episodes of symptomatic severe hypoglycemia = ***    DIABETIC COMPLICATIONS: Microvascular complications:  Neuropathy, CKD III Denies:  Last Eye Exam: Completed   Macrovascular complications:  CAD, PVD Denies:  CVA   HISTORY:  Past Medical History:  Past Medical History:  Diagnosis Date   (HFpEF) heart failure with preserved ejection fraction ([place])    a. 09/2018  Echo: EF 60-65%. PASP 78mHg. Mild-mod LAE. Mild MR/TR.   ANEMIA-IRON DEFICIENCY 07/26/2006   Anxiety    ASTHMA 07/26/2006   Asthma    Back pain, chronic    Bell's palsy    CKD (chronic kidney disease), stage III [place]    COLONIC POLYPS, HX OF 07/26/2006   COVID-19 virus infection 01/2020   DEPRESSION 03/14/2009   DIABETES MELLITUS, TYPE II 07/26/2006   [place]EASE, LUMBAR 10/05/2007   DVT 12/03/2007   DYSLIPIDEMIA 04/13/2009   Dyspnea    when gets up and walks around and back is hurting really bad -only Shortness of breath  then   GERD 07/26/2006   Gout    Heart murmur    History of kidney stones    HYPERTENSION 07/26/2006   INSOMNIA 08/21/2007   Neuropathy    Nonobstructive CAD (coronary artery disease)    a. 2012 Cath: no high grade stenosis; b. 2018 MV: No ischemia. Attenuation artifact.    OBSTRUCTIVE SLEEP APNEA 12/03/2007   Use C-PAP   PERIPHERAL NEUROPATHY 07/26/2006   Pernicious anemia 11/20/2006   PULMONARY EMBOLISM 10/05/2007   Past Surgical History:  Past Surgical History:  Procedure Laterality Date   [place] 06/14[facility]   [name]   [place]   CATARACT EXTRACTION W/PHACO Left 05/30/2014   Procedure: CATARACT EXTRACTION PHACO AND INTRAOCULAR LENS PLACEMENT ([place];  Surgeon: SEstill Cotta MD;  Location: [facility] ORS;  Service: Ophthalmology;  Laterality: Left;  [place]  01:20 AP% 23.7 CDE 31.86   COLONOSCOPY  10/2002   HP, SSA, TA, rpt 3 yrs ([place])   COLONOSCOPY  01/2005   diverticulosis rpt 5 yrs ([place])    IR NEPHROSTOMY PLACEMENT LEFT  10/19/2018   IR URETERAL STENT LEFT NEW ACCESS W/O SEP NEPHROSTOMY CATH  10/19/2018   LEFT HEART CATH AND CORONARY ANGIOGRAPHY N/A 11/01/2020   Procedure: LEFT HEART CATH AND CORONARY ANGIOGRAPHY;  Surgeon: Donald Bush, MD;  Location: [place] [facility];  Service: Cardiovascular;  Laterality: N/A;   LITHOTRIPSY     X 2    NEPHROLITHOTOMY Left 10/19/2018   Procedure: NEPHROLITHOTOMY PERCUTANEOUS;  Surgeon: Donald Gallo, MD;  [facility];  Service: Urology;  Laterality: Left;  3 HRS   PARATHYROIDECTOMY Right 04/18/2016   PARATHYROIDECTOMY for cyst Donald Manner, MD)   SHOULDER ARTHROSCOPY W/ ROTATOR CUFF REPAIR Right    TONSILLECTOMY     Social History:  reports that he has never smoked. He has never used smokeless tobacco. He reports that he does not drink alcohol and does not use drugs. Family History:  Family History  Problem Relation Age of Onset   Cancer Mother        Breast Cancer   Cancer Sister        Breast Cancer   Diabetes Father    Heart disease Father    Diabetes Paternal Grandfather      HOME MEDICATIONS: Allergies as of 08/02/2021       Reactions   Codeine Other (See Comments)   REACTION: chest pain   Pioglitazone Other (See Comments)   REACTION to Actos: swelling in ankles        Medication List        Accurate as of August 02, 2021  7:23 AM. If you have any questions, ask your nurse or doctor.          albuterol 108 (90 Base) MCG/ACT inhaler Commonly known as: VENTOLIN HFA Inhale 2 puffs into the lungs every 6 (six) hours as needed for wheezing or shortness of breath.   allopurinol 300 MG tablet Commonly known as: ZYLOPRIM Take 1 tablet (300 mg total) by mouth daily.   ALPRAZolam 1 MG tablet Commonly known as: XANAX TAKE 1 TABLET BY MOUTH AT BEDTIME IF NEEDED FOR SLEEP.   apixaban 5 MG Tabs tablet Commonly known as: ELIQUIS Take 1 tablet (5 mg total) by mouth 2 (two) times daily.   aspirin EC 81 MG tablet Take 81 mg by mouth daily. Swallow whole.   colchicine 0.6 MG tablet Commonly known as: Colcrys Take 1 tablet (0.6 mg total) by mouth daily as needed (gout flare). On first day of gout flare may take 2 tablets at once.   cyanocobalamin 1000 MCG/ML injection Commonly known as: (VITAMIN B-12) INJECT 1ML INTRAMUSCULARLY EVERY 30 DAYS    cyclobenzaprine 10 MG tablet Commonly known as: FLEXERIL Take 10 mg by mouth 3 (three) times daily as needed for muscle spasms.   doxazosin 1 MG tablet Commonly known as: Cardura Take 1 tablet (1 mg total) by mouth 2 (two) times daily.   fluticasone 50 MCG/ACT nasal spray Commonly known as: FLONASE Place 2 sprays into both nostrils daily.   FreeStyle Libre 2 Sensor Misc 1 Device by Does not apply route every 14 (fourteen) days.   furosemide 80 MG tablet Commonly known as: LASIX TAKE ONE TABLET (80 MG) BY MOUTH EVERY DAY take an extra lasix for swelling   gabapentin 600 MG tablet Commonly  known as: NEURONTIN TAKE TWO TABLETS AT BEDTIME   GE100 Blood Glucose Test test strip Generic drug: glucose blood TEST FOUR TIMES DAILY   HYDROcodone-acetaminophen 10-325 MG tablet Commonly known as: NORCO Take 1 tablet by mouth every 6 (six) hours as needed for moderate pain ((typically once to twice daily if needed)).   insulin NPH Human 100 UNIT/ML injection Commonly known as: NovoLIN N Inject 2.2 mLs (220 Units total) into the skin at bedtime.   isosorbide mononitrate 30 MG 24 hr tablet Commonly known as: IMDUR Take 1 tablet (30 mg total) by mouth daily.   losartan 25 MG tablet Commonly known as: COZAAR Take 1 tablet (25 mg total) by mouth at bedtime.   lovastatin 40 MG tablet Commonly known as: MEVACOR TAKE TWO TABLETS BY MOUTH AT BEDTIME   NovoLIN R 100 units/mL injection Generic drug: insulin regular INJECT 200 UNITS IN THE MORNING, 140 UNITS IN THE AFTERNOON, AND 280 UNITS IN THE EVENING. USE BEFORE MEAL   ondansetron 4 MG tablet Commonly known as: ZOFRAN Take 1 tablet (4 mg total) by mouth every 4 (four) hours as needed for nausea.   OPCON-A OP Place 1 drop into both eyes 3 (three) times daily as needed (itchy eyes.).   Potassium Citrate 15 MEQ (1620 MG) Tbcr Take 1 tablet by mouth 3 (three) times daily.   potassium citrate 10 MEQ (1080 MG) SR tablet Commonly  known as: UROCIT-K Take 1 tablet (10 mEq total) by mouth 3 (three) times daily with meals.   promethazine-dextromethorphan 6.25-15 MG/5ML syrup Commonly known as: PROMETHAZINE-DM Take 5 mLs by mouth 4 (four) times daily as needed.   STOOL SOFTENER & LAXATIVE PO Take 2 tablets by mouth at bedtime.   SYRINGE 3CC/22GX1-1/2" 22G X 1-1/2" 3 ML Misc Use to administer monthly b12 shots   Vitamin D3 25 MCG (1000 UT) Caps Take 1 capsule (1,000 Units total) by mouth daily.         OBJECTIVE:   Vital Signs: There were no vitals taken for this visit.  Wt Readings from Last 3 Encounters:  06/26/21 252 lb 6 oz (114.5 kg)  05/14/21 256 lb (116.1 kg)  05/02/21 262 lb 12.8 oz (119.2 kg)     Exam: General: Pt appears well and is in NAD  Hydration: Well-hydrated with moist mucous membranes and good skin turgor  HEENT: Head: Unremarkable with good dentition. Oropharynx clear without exudate.  Eyes: External eye exam normal without stare, lid lag or exophthalmos.  EOM intact.  PERRL.  Neck: General: Supple without adenopathy. Thyroid: Thyroid size normal.  No goiter or nodules appreciated. No thyroid bruit.  Lungs: Clear with good BS bilat with no rales, rhonchi, or wheezes  Heart: RRR with normal S1 and S2 and no gallops; no murmurs; no rub  Abdomen: Normoactive bowel sounds, soft, nontender, without masses or organomegaly palpable  Extremities: No pretibial edema. No tremor. Normal strength and motion throughout. See detailed diabetic foot exam below.  Skin: Normal texture and temperature to palpation. No rash noted. No Acanthosis nigricans/skin tags. No lipohypertrophy.  Neuro: MS is good with appropriate affect, pt is alert and Ox3    DM foot exam: Please see diabetic assessment flow-sheet detailed below:           DATA REVIEWED:  Lab Results  Component Value Date   HGBA1C 7.5 (H) 06/20/2021   HGBA1C 7.3 (A) 05/02/2021   HGBA1C 7.8 (A) 02/01/2021   Lab Results  Component  Value Date   MICROALBUR  12.0 (H) 03/02/2015   LDLCALC 48 06/20/2021   CREATININE 1.45 06/20/2021   Lab Results  Component Value Date   MICRALBCREAT 6.8 03/02/2015     Lab Results  Component Value Date   CHOL 105 06/20/2021   HDL 36.30 (L) 06/20/2021   LDLCALC 48 06/20/2021   LDLDIRECT 52.2 05/25/2009   TRIG 104.0 06/20/2021   CHOLHDL 3 06/20/2021        Latest Reference Range & Units 06/20/21 09:07  Sodium 135 - 145 mEq/L 142  Potassium 3.5 - 5.1 mEq/L 4.3  Chloride 96 - 112 mEq/L 103  CO2 19 - 32 mEq/L 29  Glucose 70 - 99 mg/dL 120 (H)  BUN 6 - 23 mg/dL 30 (H)  Creatinine 0.40 - 1.50 mg/dL 1.45  Calcium 8.4 - 10.5 mg/dL 9.2  Alkaline Phosphatase 39 - 117 U/L 91  Albumin 3.5 - 5.2 g/dL 4.1  Uric Acid, Serum 4.0 - 7.8 mg/dL 4.8  AST 0 - 37 U/L 14  ALT 0 - 53 U/L 14  Total Protein 6.0 - 8.3 g/dL 6.1  Total Bilirubin 0.2 - 1.2 mg/dL 1.3 (H)  GFR >60.00 mL/min 47.82 (L)  (H): Data is abnormally high (L): Data is abnormally low   ASSESSMENT / PLAN / RECOMMENDATIONS:   1) Type 2 Diabetes Mellitus, ***controlled, With neuropathic, CKD III and macrovascular complications - Most recent A1c of *** %. Goal A1c <7.0%.    Plan: MEDICATIONS: ***  EDUCATION / INSTRUCTIONS: BG monitoring instructions: Patient is instructed to check his blood sugars *** times a day, ***. Call [facility] if: BG persistently < 70 I reviewed the Rule of 15 for the treatment of hypoglycemia in detail with the patient. Literature supplied.     2) Diabetic complications:  Eye: Does *** have known diabetic retinopathy.  Neuro/ Feet: Does  have known diabetic peripheral neuropathy .  Renal: Patient does  have known baseline CKD. He   is *** on an ACEI/ARB at present. Check urine albumin/creatinine ratio yearly starting at time of diagnosis. If albuminuria is positive, treatment is geared toward better glucose, blood pressure control and use of ACE inhibitors or ARBs. Monitor  electrolytes and creatinine once to twice yearly.   3) Lipids: Patient is *** on a statin.  4) Hypertension: *** at goal of < 140/90 mmHg.    F/U in ***    Signed electronically by: Mack Guise, MD  [facility]  [facility] [address] [place], [place] [address] Phone: [phone] FAX: [phone]   CC: Donald Bush, MD [place] [address] Phone: [phone]  Fax: [phone]  Return to Endocrinology clinic as below: Future Appointments  Date Time Provider [place]  08/02/2021  7:30 AM Martavion Couper, Melanie Crazier, MD [facility] None  08/06/2021 11:30 AM Caragh Gasper, Melanie Crazier, MD [facility] None  09/13/2021  9:00 AM Rigoberto Noel, MD [facility] None  12/31/2021 10:30 AM Donald Bush, MD [facility]

## 2021-08-04 ENCOUNTER — Other Ambulatory Visit: Payer: Self-pay | Admitting: Family Medicine

## 2021-08-06 ENCOUNTER — Ambulatory Visit: Payer: Medicare Other | Admitting: Internal Medicine

## 2021-08-08 NOTE — Telephone Encounter (Signed)
Per 06/26/21 OV note, pt is on colchicine only as needed for gout.  Spoke with pt relaying info above and asking if he is still taking allopurinol.  Pt confirms he still takes allopurinol daily.   E-scribed refill.

## 2021-08-23 ENCOUNTER — Ambulatory Visit: Payer: Medicare Other | Admitting: Podiatry

## 2021-08-23 DIAGNOSIS — H2511 Age-related nuclear cataract, right eye: Secondary | ICD-10-CM | POA: Diagnosis not present

## 2021-08-28 NOTE — Anesthesia Preprocedure Evaluation (Addendum)
Anesthesia Evaluation  Patient identified by MRN, date of birth, ID band Patient awake    Reviewed: Allergy & Precautions, NPO status , Patient's Chart, lab work & pertinent test results  History of Anesthesia Complications Negative for: history of anesthetic complications  Airway Mallampati: III  TM Distance: >3 FB Neck ROM: full    Dental no notable dental hx.    Pulmonary asthma , sleep apnea ,    Pulmonary exam normal        Cardiovascular Exercise Tolerance: Poor hypertension, Pt. on medications + CAD (Nonobstructive CAD ), + Peripheral Vascular Disease and +CHF (HFpEF)  + dysrhythmias (Paroxysmal atrial fibrillation ) + Valvular Problems/Murmurs MR and AS  Rhythm:Regular Rate:Normal + Systolic murmurs Prior hospitalization October 2022 for, Syncope , found on his garage floor Low potassium 2.9 , low sugar also noted to be in atrial fibrillation  Cardiac catheterization in October 2022 1. Mild, non-obstructive coronary artery disease, as detailed below. No significant stenosis identified to explain syncope or cardiomyopathy. 2. Normal left ventricular filling pressure. 3. Mild-moderate aortic valve stenosis based on peak-to-peak gradient (~20 mmHg).  Echo showed EF of 45 to 50% with global hypokinesis, grade III diastolic dysfunction.   Carotid ultrasound less than 50% stenosis bilaterally.  MRI with no acute intracranial findings   Lower extremity edema   Neuro/Psych Neuropathy    GI/Hepatic negative GI ROS, Neg liver ROS,   Endo/Other  diabetes (Long-standing diabetes, HBA1C 7.7), Poorly Controlled, Type 2Morbid obesityS/P parathyroidectomy surgery Now with vertigo Following surgery  Renal/GU CRFRenal disease     Musculoskeletal   Abdominal (+) + obese,   Peds  Hematology negative hematology ROS (+)   Anesthesia Other Findings Past Medical History: No date: (HFpEF) heart failure with preserved  ejection fraction ([place])     Comment:  a. 09/2018 Echo: EF 60-65%. PASP 76mHg. Mild-mod LAE.               Mild MR/TR. 07/26/2006: ANEMIA-IRON DEFICIENCY No date: Anxiety 07/26/2006: ASTHMA No date: Asthma No date: Back pain, chronic No date: Bell's palsy No date: CKD (chronic kidney disease), stage III ([place] 07/26/2006: COLONIC POLYPS, HX OF 01/2020: COVID-19 virus infection 03/14/2009: DEPRESSION 07/26/2006: DIABETES MELLITUS, TYPE II 10/05/2007: [place]EASE, LUMBAR 12/03/2007: DVT 04/13/2009: DYSLIPIDEMIA No date: Dyspnea     Comment:  when gets up and walks around and back is hurting really              bad -only Shortness of breath  then 07/26/2006: GERD No date: Gout No date: Heart murmur No date: History of kidney stones 07/26/2006: HYPERTENSION 08/21/2007: INSOMNIA No date: Neuropathy No date: Nonobstructive CAD (coronary artery disease)     Comment:  a. 2012 Cath: no high grade stenosis; b. 2018 MV: No               ischemia. Attenuation artifact.  12/03/2007: OBSTRUCTIVE SLEEP APNEA     Comment:  Use C-PAP 07/26/2006: PERIPHERAL NEUROPATHY 11/20/2006: Pernicious anemia 10/05/2007: PULMONARY EMBOLISM  Past Surgical History: [place] 06/14/2008: BACK SURGERY     Comment:  [facility](06/10) No date: CARDIAC CATHETERIZATION 10/2017: CARPAL TUNNEL RELEASE; Right 05/30/2014: CATARACT EXTRACTION W/PHACO; Left     Comment:  Procedure: CATARACT EXTRACTION PHACO AND INTRAOCULAR               LENS PLACEMENT ([facility]);  Surgeon: SEstill Cotta MD;                [facility]: [id number]  ORS;  Service: Ophthalmology;  Laterality:              Left;  [name] 01:20 AP% 23.7 CDE 31.86 10/2002: COLONOSCOPY     Comment:  HP, SSA, TA, rpt 3 yrs ([place]) 01/2005: COLONOSCOPY     Comment:  diverticulosis rpt 5 yrs ([place])  10/19/2018: IR NEPHROSTOMY PLACEMENT LEFT 10/19/2018: IR URETERAL STENT LEFT NEW ACCESS W/O SEP NEPHROSTOMY CATH 11/01/2020: LEFT HEART CATH AND  CORONARY ANGIOGRAPHY; N/A     Comment:  Procedure: LEFT HEART CATH AND CORONARY ANGIOGRAPHY;                Surgeon: Nelva Bush, MD;  Location: [place]               CV LAB;  Service: Cardiovascular;  Laterality: N/A; No date: LITHOTRIPSY     Comment:  X 2 10/19/2018: NEPHROLITHOTOMY; Left     Comment:  Procedure: NEPHROLITHOTOMY PERCUTANEOUS;  Surgeon:               Franchot Gallo, MD;  [facility];  Service:               Urology;  Laterality: Left;  3 HRS 04/18/2016: PARATHYROIDECTOMY; Right     Comment:  PARATHYROIDECTOMY for cyst [name], MD) No date: SHOULDER ARTHROSCOPY W/ ROTATOR CUFF REPAIR; Right No date: TONSILLECTOMY     Reproductive/Obstetrics negative OB ROS                            Anesthesia Physical Anesthesia Plan  ASA: 3  Anesthesia Plan: MAC   Post-op Pain Management: Minimal or no pain anticipated   Induction: Intravenous  PONV Risk Score and Plan: 1 and Treatment may vary due to age or medical condition  Airway Management Planned: Natural Airway and Nasal Cannula  Additional Equipment:   Intra-op Plan:   Post-operative Plan:   Informed Consent: I have reviewed the patients History and Physical, chart, labs and discussed the procedure including the risks, benefits and alternatives for the proposed anesthesia with the patient or authorized representative who has indicated his/her understanding and acceptance.       Plan Discussed with: Anesthesiologist, CRNA and Surgeon  Anesthesia Plan Comments:        Anesthesia Quick Evaluation

## 2021-08-29 ENCOUNTER — Encounter: Payer: Self-pay | Admitting: Ophthalmology

## 2021-08-29 ENCOUNTER — Other Ambulatory Visit: Payer: Self-pay

## 2021-08-29 DIAGNOSIS — E119 Type 2 diabetes mellitus without complications: Secondary | ICD-10-CM

## 2021-08-29 MED ORDER — GE100 BLOOD GLUCOSE TEST VI STRP: ORAL_STRIP | 1 refills | Status: DC

## 2021-08-30 ENCOUNTER — Other Ambulatory Visit: Payer: Self-pay

## 2021-08-30 DIAGNOSIS — E119 Type 2 diabetes mellitus without complications: Secondary | ICD-10-CM

## 2021-08-30 MED ORDER — GE100 BLOOD GLUCOSE TEST VI STRP: ORAL_STRIP | 10 refills | Status: AC

## 2021-09-04 NOTE — Discharge Instructions (Signed)

## 2021-09-06 ENCOUNTER — Ambulatory Visit
Admission: RE | Admit: 2021-09-06 | Discharge: 2021-09-06 | Disposition: A | Discharge status: Home / Self Care | Payer: Medicare Other | Source: Ambulatory Visit | Attending: Ophthalmology | Admitting: Ophthalmology

## 2021-09-06 ENCOUNTER — Other Ambulatory Visit: Payer: Self-pay

## 2021-09-06 ENCOUNTER — Ambulatory Visit (AMBULATORY_SURGERY_CENTER): Payer: Medicare Other | Admitting: Anesthesiology

## 2021-09-06 ENCOUNTER — Encounter: Payer: Self-pay | Admitting: Ophthalmology

## 2021-09-06 ENCOUNTER — Ambulatory Visit: Payer: Medicare Other | Admitting: Anesthesiology

## 2021-09-06 ENCOUNTER — Encounter
Admission: RE | Disposition: A | Discharge status: Home / Self Care | Payer: Self-pay | Source: Ambulatory Visit | Attending: Ophthalmology

## 2021-09-06 DIAGNOSIS — J45909 Unspecified asthma, uncomplicated: Secondary | ICD-10-CM | POA: Diagnosis not present

## 2021-09-06 DIAGNOSIS — I429 Cardiomyopathy, unspecified: Secondary | ICD-10-CM | POA: Insufficient documentation

## 2021-09-06 DIAGNOSIS — Z794 Long term (current) use of insulin: Secondary | ICD-10-CM | POA: Diagnosis not present

## 2021-09-06 DIAGNOSIS — G4733 Obstructive sleep apnea (adult) (pediatric): Secondary | ICD-10-CM | POA: Diagnosis not present

## 2021-09-06 DIAGNOSIS — I5032 Chronic diastolic (congestive) heart failure: Secondary | ICD-10-CM | POA: Insufficient documentation

## 2021-09-06 DIAGNOSIS — E1122 Type 2 diabetes mellitus with diabetic chronic kidney disease: Secondary | ICD-10-CM | POA: Insufficient documentation

## 2021-09-06 DIAGNOSIS — E890 Postprocedural hypothyroidism: Secondary | ICD-10-CM | POA: Diagnosis not present

## 2021-09-06 DIAGNOSIS — H2511 Age-related nuclear cataract, right eye: Secondary | ICD-10-CM | POA: Diagnosis not present

## 2021-09-06 DIAGNOSIS — E1136 Type 2 diabetes mellitus with diabetic cataract: Secondary | ICD-10-CM | POA: Diagnosis not present

## 2021-09-06 DIAGNOSIS — I2510 Atherosclerotic heart disease of native coronary artery without angina pectoris: Secondary | ICD-10-CM | POA: Diagnosis not present

## 2021-09-06 DIAGNOSIS — N189 Chronic kidney disease, unspecified: Secondary | ICD-10-CM | POA: Diagnosis not present

## 2021-09-06 DIAGNOSIS — I480 Paroxysmal atrial fibrillation: Secondary | ICD-10-CM | POA: Diagnosis not present

## 2021-09-06 DIAGNOSIS — Z6841 Body Mass Index (BMI) 40.0 and over, adult: Secondary | ICD-10-CM | POA: Diagnosis not present

## 2021-09-06 DIAGNOSIS — I509 Heart failure, unspecified: Secondary | ICD-10-CM

## 2021-09-06 DIAGNOSIS — E1165 Type 2 diabetes mellitus with hyperglycemia: Secondary | ICD-10-CM

## 2021-09-06 DIAGNOSIS — N183 Chronic kidney disease, stage 3 unspecified: Secondary | ICD-10-CM | POA: Diagnosis not present

## 2021-09-06 DIAGNOSIS — I130 Hypertensive heart and chronic kidney disease with heart failure and stage 1 through stage 4 chronic kidney disease, or unspecified chronic kidney disease: Secondary | ICD-10-CM | POA: Diagnosis not present

## 2021-09-06 DIAGNOSIS — E1151 Type 2 diabetes mellitus with diabetic peripheral angiopathy without gangrene: Secondary | ICD-10-CM | POA: Diagnosis not present

## 2021-09-06 HISTORY — PX: CATARACT EXTRACTION W/PHACO: SHX586

## 2021-09-06 HISTORY — DX: Dizziness and giddiness: R42

## 2021-09-06 LAB — GLUCOSE, CAPILLARY
Glucose-Capillary: 105 mg/dL — ABNORMAL HIGH (ref 70–99)
Glucose-Capillary: 50 mg/dL — ABNORMAL LOW (ref 70–99)
Glucose-Capillary: 70 mg/dL (ref 70–99)

## 2021-09-06 SURGERY — PHACOEMULSIFICATION, CATARACT, WITH IOL INSERTION
Anesthesia: Monitor Anesthesia Care | Site: Eye | Laterality: Right

## 2021-09-06 MED ORDER — SIGHTPATH DOSE#1 NA HYALUR & NA CHOND-NA HYALUR IO KIT
PACK | INTRAOCULAR | Status: DC | PRN
  Administered 2021-09-06: 1 via OPHTHALMIC

## 2021-09-06 MED ORDER — TETRACAINE HCL 0.5 % OP SOLN
1.0000 [drp] | OPHTHALMIC | Status: DC | PRN
  Administered 2021-09-06 (×3): 1 [drp] via OPHTHALMIC

## 2021-09-06 MED ORDER — MIDAZOLAM HCL 2 MG/2ML IJ SOLN
INTRAMUSCULAR | Status: DC | PRN
  Administered 2021-09-06: 1 mg via INTRAVENOUS

## 2021-09-06 MED ORDER — PHENYLEPHRINE-KETOROLAC 1-0.3 % IO SOLN
INTRAOCULAR | Status: DC | PRN
  Administered 2021-09-06: 147 mL via OPHTHALMIC

## 2021-09-06 MED ORDER — LIDOCAINE HCL (PF) 2 % IJ SOLN
INTRAOCULAR | Status: DC | PRN
  Administered 2021-09-06: 1 mL via INTRAOCULAR

## 2021-09-06 MED ORDER — BRIMONIDINE TARTRATE-TIMOLOL 0.2-0.5 % OP SOLN
OPHTHALMIC | Status: DC | PRN
  Administered 2021-09-06: 1 [drp] via OPHTHALMIC

## 2021-09-06 MED ORDER — MOXIFLOXACIN HCL 0.5 % OP SOLN
OPHTHALMIC | Status: DC | PRN
  Administered 2021-09-06: 0.2 mL via OPHTHALMIC

## 2021-09-06 MED ORDER — FENTANYL CITRATE (PF) 100 MCG/2ML IJ SOLN
INTRAMUSCULAR | Status: DC | PRN
Start: 2021-09-06 — End: 2021-09-06
  Administered 2021-09-06: 25 ug via INTRAVENOUS
  Administered 2021-09-06: 50 ug via INTRAVENOUS

## 2021-09-06 MED ORDER — ARMC OPHTHALMIC DILATING DROPS
1.0000 | OPHTHALMIC | Status: DC | PRN
Start: 2021-09-06 — End: 2021-09-06
  Administered 2021-09-06 (×3): 1 via OPHTHALMIC

## 2021-09-06 MED ORDER — SIGHTPATH DOSE#1 NA CHONDROIT SULF-NA HYALURON 20-15 MG/0.5ML IO SOSY
INTRAOCULAR | Status: DC | PRN
  Administered 2021-09-06: .5 mL via INTRAOCULAR

## 2021-09-06 MED ORDER — SIGHTPATH DOSE#1 BSS IO SOLN
INTRAOCULAR | Status: DC | PRN
  Administered 2021-09-06: 15 mL

## 2021-09-06 SURGICAL SUPPLY — 25 items
CANNULA ANT/CHMB 27G (MISCELLANEOUS) IMPLANT
CANNULA ANT/CHMB 27GA (MISCELLANEOUS) IMPLANT
CATARACT SUITE SIGHTPATH (MISCELLANEOUS) ×1 IMPLANT
DISSECTOR HYDRO NUCLEUS 50X22 (MISCELLANEOUS) ×1 IMPLANT
DRSG TEGADERM 2-3/8X2-3/4 SM (GAUZE/BANDAGES/DRESSINGS) ×1 IMPLANT
FEE CATARACT SUITE SIGHTPATH (MISCELLANEOUS) ×1 IMPLANT
GLOVE SURG SYN 7.5  E (GLOVE) ×1
GLOVE SURG SYN 7.5 E (GLOVE) ×1 IMPLANT
GLOVE SURG SYN 7.5 PF PI (GLOVE) ×1 IMPLANT
GLOVE SURG SYN 8.5  E (GLOVE) ×1
GLOVE SURG SYN 8.5 E (GLOVE) ×1 IMPLANT
GLOVE SURG SYN 8.5 PF PI (GLOVE) ×1 IMPLANT
KIT SLEEVE INFUSION .9 MICRO (MISCELLANEOUS) IMPLANT
LENS IOL ACRSF IQ ULTRA 19.0 (Intraocular Lens) IMPLANT
LENS IOL ACRYSOF IQ 19.0 (Intraocular Lens) ×1 IMPLANT
NDL FILTER BLUNT 18X1 1/2 (NEEDLE) IMPLANT
NEEDLE FILTER BLUNT 18X 1/2SAF (NEEDLE)
NEEDLE FILTER BLUNT 18X1 1/2 (NEEDLE) IMPLANT
PACK VIT ANT 23G (MISCELLANEOUS) IMPLANT
RING MALYGIN (MISCELLANEOUS) IMPLANT
SUT ETHILON 10-0 CS-B-6CS-B-6 (SUTURE)
SUTURE EHLN 10-0 CS-B-6CS-B-6 (SUTURE) IMPLANT
SYR 3ML LL SCALE MARK (SYRINGE) IMPLANT
SYR 5ML LL (SYRINGE) IMPLANT
WATER STERILE IRR 250ML POUR (IV SOLUTION) ×1 IMPLANT

## 2021-09-06 NOTE — Transfer of Care (Signed)
Immediate Anesthesia Transfer of Care Note  Patient: Donald [name]  Procedure(s) Performed: CATARACT EXTRACTION PHACO AND INTRAOCULAR LENS PLACEMENT ([facility]) RIGHT DIABETIC VISION BLUE OMIDRIA MALYUGIN IRIS HOOKS (Right: Eye)  Patient Location: PACU  Anesthesia Type: MAC  Level of Consciousness: awake, alert  and patient cooperative  Airway and Oxygen Therapy: Patient Spontanous Breathing and Patient connected to supplemental oxygen  Post-op Assessment: Post-op Vital signs reviewed, Patient's Cardiovascular Status Stable, Respiratory Function Stable, Patent Airway and No signs of Nausea or vomiting  Post-op Vital Signs: Reviewed and stable  Complications: No notable events documented.

## 2021-09-06 NOTE — H&P (Signed)
[facility]   Primary Care Physician:  Ria Bush, MD Ophthalmologist: Dr. Merleen Nicely  Pre-Procedure History & Physical: HPI:  Donald [name] is a 76 y.o. male here for cataract surgery.   Past Medical History:  Diagnosis Date   (HFpEF) heart failure with preserved ejection fraction ([place])    a. 09/2018 Echo: EF 60-65%. PASP 17mHg. Mild-mod LAE. Mild MR/TR.   ANEMIA-IRON DEFICIENCY 07/26/2006   Anxiety    ASTHMA 07/26/2006   Asthma    Back pain, chronic    Bell's palsy    CKD (chronic kidney disease), stage III ([place]    COLONIC POLYPS, HX OF 07/26/2006   COVID-19 virus infection 01/2020   DEPRESSION 03/14/2009   DIABETES MELLITUS, TYPE II 07/26/2006   DTatumsDISEASE, LUMBAR 10/05/2007   DVT 12/03/2007   DYSLIPIDEMIA 04/13/2009   Dyspnea    when gets up and walks around and back is hurting really bad -only Shortness of breath  then   GERD 07/26/2006   Gout    Heart murmur    History of kidney stones    HYPERTENSION 07/26/2006   INSOMNIA 08/21/2007   Neuropathy    Nonobstructive CAD (coronary artery disease)    a. 2012 Cath: no high grade stenosis; b. 2018 MV: No ischemia. Attenuation artifact.    OBSTRUCTIVE SLEEP APNEA 12/03/2007   Use C-PAP   PERIPHERAL NEUROPATHY 07/26/2006   Pernicious anemia 11/20/2006   PULMONARY EMBOLISM 10/05/2007   Vertigo     Past Surgical History:  Procedure Laterality Date   [place] 06/14/[place]   [facility]([phone])   [place]   CATARACT EXTRACTION W/PHACO Left 05/30/2014   Procedure: CATARACT EXTRACTION PHACO AND INTRAOCULAR LENS PLACEMENT ([place];  Surgeon: SEstill Cotta MD;  Location: [facility] ORS;  Service: Ophthalmology;  Laterality: Left;  [place]:20 AP% 23.7 CDE 31.86   COLONOSCOPY  10/2002   HP, SSA, TA, rpt 3 yrs ([place])   COLONOSCOPY  01/2005   diverticulosis rpt 5 yrs ([place])    IR NEPHROSTOMY  PLACEMENT LEFT  10/19/2018   IR URETERAL STENT LEFT NEW ACCESS W/O SEP NEPHROSTOMY CATH  10/19/2018   LEFT HEART CATH AND CORONARY ANGIOGRAPHY N/A 11/01/2020   Procedure: LEFT HEART CATH AND CORONARY ANGIOGRAPHY;  Surgeon: ENelva Bush MD;  [place] [place] [facility];  Service: Cardiovascular;  Laterality: N/A;   LITHOTRIPSY     X 2   NEPHROLITHOTOMY Left 10/19/2018   Procedure: NEPHROLITHOTOMY PERCUTANEOUS;  Surgeon: DFranchot Gallo MD;  Location: [facility];  Service: Urology;  Laterality: Left;  3 HRS   PARATHYROIDECTOMY Right 04/18/2016   PARATHYROIDECTOMY for cyst ([name] MD)   SHOULDER ARTHROSCOPY W/ ROTATOR CUFF REPAIR Right    TONSILLECTOMY      Prior to Admission medications   Medication Sig Start Date End Date Taking? Authorizing Provider  allopurinol (ZYLOPRIM) 300 MG tablet TAKE ONE TABLET BY MOUTH EVERY DAY 08/08/21  Yes GRia Bush MD  ALPRAZolam (Duanne Moron 1 MG tablet TAKE 1 TABLET BY MOUTH AT BEDTIME IF NEEDED FOR SLEEP. 06/19/21  Yes GRia Bush MD  aspirin 81 MG EC tablet Take 81 mg by mouth daily. Swallow whole.   Yes [provider]  Cholecalciferol (VITAMIN D3) 25 MCG (1000 UT) CAPS Take 1 capsule (1,000 Units total) by mouth daily. 03/17/20  Yes GRia Bush MD  colchicine (COLCRYS) 0.6 MG tablet Take 1 tablet (  0.6 mg total) by mouth daily as needed (gout flare). On first day of gout flare may take 2 tablets at once. 05/04/20  Yes Ria Bush, MD  Continuous Blood Gluc Sensor (FREESTYLE LIBRE 2 SENSOR) MISC 1 Device by Does not apply route every 14 (fourteen) days. 02/01/21  Yes Renato Shin, MD  cyanocobalamin (,VITAMIN B-12,) 1000 MCG/ML injection INJECT 1ML INTRAMUSCULARLY EVERY 30 DAYS 06/18/21  Yes Ria Bush, MD  cyclobenzaprine (FLEXERIL) 10 MG tablet Take 10 mg by mouth 3 (three) times daily as needed for muscle spasms. 10/13/20  Yes [provider]  doxazosin (CARDURA) 1 MG tablet Take 1 tablet (1 mg total)  by mouth 2 (two) times daily. 11/13/20  Yes Gollan, Kathlene November, MD  furosemide (LASIX) 80 MG tablet TAKE ONE TABLET (80 MG) BY MOUTH EVERY DAY take an extra lasix for swelling 11/13/20  Yes Gollan, Kathlene November, MD  gabapentin (NEURONTIN) 600 MG tablet TAKE TWO TABLETS AT BEDTIME 09/19/20  Yes Ria Bush, MD  glucose blood (GE100 BLOOD GLUCOSE TEST) test strip TEST FOUR TIMES DAILY 08/30/21  Yes Shamleffer, Melanie Crazier, MD  HYDROcodone-acetaminophen (NORCO) 10-325 MG tablet Take 1 tablet by mouth every 6 (six) hours as needed for moderate pain ((typically once to twice daily if needed)).  12/19/17  Yes Ria Bush, MD  insulin NPH Human (NOVOLIN N) 100 UNIT/ML injection Inject 2.2 mLs (220 Units total) into the skin at bedtime. 05/02/21  Yes Renato Shin, MD  isosorbide mononitrate (IMDUR) 30 MG 24 hr tablet Take 1 tablet (30 mg total) by mouth daily. 11/13/20  Yes Minna Merritts, MD  losartan (COZAAR) 25 MG tablet Take 1 tablet (25 mg total) by mouth at bedtime. 11/13/20  Yes Gollan, Kathlene November, MD  lovastatin (MEVACOR) 40 MG tablet TAKE TWO TABLETS BY MOUTH AT BEDTIME 06/28/21  Yes Gollan, Kathlene November, MD  meclizine (ANTIVERT) 25 MG tablet Take 25 mg by mouth 3 (three) times daily as needed for dizziness.   Yes [provider]  Naphazoline-Pheniramine (OPCON-A OP) Place 1 drop into both eyes 3 (three) times daily as needed (itchy eyes.).    Yes [provider]  NOVOLIN R 100 UNIT/ML injection INJECT 200 UNITS IN THE MORNING, 140 UNITS IN THE AFTERNOON, AND 280 UNITS IN THE EVENING. USE BEFORE MEAL 05/29/21  Yes Philemon Kingdom, MD  ondansetron (ZOFRAN) 4 MG tablet Take 1 tablet (4 mg total) by mouth every 4 (four) hours as needed for nausea. 08/10/18  Yes Lesleigh Noe, MD  potassium citrate (UROCIT-K) 10 MEQ (1080 MG) SR tablet Take 10 mEq by mouth 3 (three) times a week. 06/26/21  Yes Ria Bush, MD  Sennosides-Docusate Sodium (STOOL SOFTENER & LAXATIVE PO) Take 2  tablets by mouth at bedtime.   Yes [provider]  Syringe/Needle, Disp, (SYRINGE 3CC/22GX1-1/2") 22G X 1-1/2" 3 ML MISC Use to administer monthly b12 shots 03/18/20  Yes Ria Bush, MD  albuterol (VENTOLIN HFA) 108 (90 Base) MCG/ACT inhaler Inhale 2 puffs into the lungs every 6 (six) hours as needed for wheezing or shortness of breath. Patient not taking: Reported on 08/29/2021 02/04/20   Ria Bush, MD  apixaban (ELIQUIS) 5 MG TABS tablet Take 1 tablet (5 mg total) by mouth 2 (two) times daily. Patient not taking: Reported on 05/14/2021 11/13/20   Minna Merritts, MD  fluticasone [facility]) 50 MCG/ACT nasal spray Place 2 sprays into both nostrils daily. Patient not taking: Reported on 08/29/2021 06/18/19   Elby Beck, FNP  Potassium Citrate 15 MEQ (1620 MG) TBCR Take 1 tablet by mouth 3 (three) times daily. Patient not taking: Reported on 08/29/2021    [provider]    Allergies as of 07/05/2021 - Review Complete 07/03/2021  Allergen Reaction Noted   Codeine Other (See Comments)    Pioglitazone Other (See Comments) 07/26/2006    Family History  Problem Relation Age of Onset   Cancer Mother        Breast Cancer   Cancer Sister        Breast Cancer   Diabetes Father    Heart disease Father    Diabetes Paternal Grandfather     Social History   Socioeconomic History   Marital status: Married    Spouse name: Not on file   Number of children: Not on file   Years of education: Not on file   Highest education level: Not on file  Occupational History   Occupation: Disabled    Employer: [employer]  Tobacco Use   Smoking status: Never   Smokeless tobacco: Never  Vaping Use   Vaping Use: Never used  Substance and Sexual Activity   Alcohol use: No   Drug use: No   Sexual activity: Not on file  Other Topics Concern   Not on file  Social History Narrative   Married   Children   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   Activity  is severely limited by medical problems   Diet is "good".   Never a smoker   Alcohol: none         Social Determinants of Health   Financial Resource Strain: Medium Risk (07/31/2020)   Overall Financial Resource Strain (CARDIA)    Difficulty of Paying Living Expenses: Somewhat hard  Food Insecurity: No Food Insecurity (04/20/2019)   Hunger Vital Sign    Worried About Running Out of Food in the Last Year: Never true    [place] in the Last Year: Never true  Transportation Needs: No Transportation Needs (04/20/2019)   PRAPARE - Hydrologist (Medical): No    Lack of Transportation (Non-Medical): No  Physical Activity: Inactive (04/20/2019)   Exercise Vital Sign    Days of Exercise per Week: 0 days    Minutes of Exercise per Session: 0 min  Stress: No Stress Concern Present (04/20/2019)   [place]    Feeling of Stress : Not at all  Social Connections: Not on file  Intimate Partner Violence: Not At Risk (04/20/2019)   Humiliation, Afraid, Rape, and Kick questionnaire    Fear of Current or Ex-Partner: No    Emotionally Abused: No    Physically Abused: No    Sexually Abused: No    Review of Systems: See HPI, otherwise negative ROS  Physical Exam: BP (!) 176/71   Pulse 63   Temp (!) 97 F (36.1 C) (Temporal)   Resp 20   Ht '5\' 5"'$  (1.651 m)   Wt 114.8 kg   SpO2 98%   BMI 42.10 kg/m  General:   Alert, cooperative in NAD Head:  Normocephalic and atraumatic. Respiratory:  Normal work of breathing. Cardiovascular:  RRR  Impression/Plan: Donald [name] is here for cataract surgery.  Risks, benefits, limitations, and alternatives regarding cataract surgery have been reviewed with the patient.  Questions have been answered.  All parties agreeable.   Norvel Richards, MD  09/06/2021, 7:03 AM

## 2021-09-06 NOTE — Anesthesia Postprocedure Evaluation (Signed)
Anesthesia Post Note  Patient: Donald [name]  Procedure(s) Performed: CATARACT EXTRACTION PHACO AND INTRAOCULAR LENS PLACEMENT (IOC) RIGHT DIABETIC VISION BLUE OMIDRIA MALYUGIN IRIS HOOKS (Right: Eye)     Patient location during evaluation: PACU Anesthesia Type: MAC Level of consciousness: awake and alert Pain management: pain level controlled Vital Signs Assessment: post-procedure vital signs reviewed and stable Respiratory status: spontaneous breathing, nonlabored ventilation and respiratory function stable Cardiovascular status: blood pressure returned to baseline and stable Postop Assessment: no apparent nausea or vomiting Anesthetic complications: no Comments: Blood sugar above 70 prior to discharge. Pt denies symptoms of hypoglycemia. He is actively drinking carbohydrate drink and plans to monitor blood sugar aggressively.     No notable events documented.  [place] Ouch

## 2021-09-06 NOTE — Op Note (Signed)
OPERATIVE NOTE  Donald [name] [id number] 09/06/2021   PREOPERATIVE DIAGNOSIS: Nuclear sclerotic cataract right eye. H25.11   POSTOPERATIVE DIAGNOSIS: Nuclear sclerotic cataract right eye. H25.11   PROCEDURE:  Phacoemusification with posterior chamber intraocular lens placement of the right eye  Ultrasound time: Procedure(s) with comments: CATARACT EXTRACTION PHACO AND INTRAOCULAR LENS PLACEMENT (IOC) RIGHT DIABETIC VISION BLUE OMIDRIA MALYUGIN IRIS HOOKS (Right) - 14.65 2.11.1  LENS:   Implant Name Type Inv. Item Serial No. Manufacturer Lot No. LRB No. Used Action  LENS IOL ACRYSOF IQ 19.0 - [id number] Intraocular Lens LENS IOL ACRYSOF IQ 19.0 [id number] [employer]  Right 1 Implanted      SURGEON:  Courtney Heys. Lazarus Salines, MD   ANESTHESIA:  Topical with tetracaine drops, augmented with 1% preservative-free intracameral lidocaine.   COMPLICATIONS:  None.   DESCRIPTION OF PROCEDURE:  The patient was identified in the holding room and transported to the operating room and placed in the supine position under the operating microscope.  The right eye was identified as the operative eye, which was prepped and draped in the usual sterile ophthalmic fashion.   A 1 millimeter clear-corneal paracentesis was made superotemporally. Preservative-free 1% lidocaine mixed with 1:1,000 bisulfite-free aqueous solution of epinephrine was injected into the anterior chamber. The anterior chamber was then filled with Viscoat viscoelastic. A 2.4 millimeter keratome was used to make a clear-corneal incision inferotemporally. A curvilinear capsulorrhexis was made with a cystotome and capsulorrhexis forceps. Balanced salt solution was used to hydrodissect and hydrodelineate the nucleus. Phacoemulsification was then used to remove the lens nucleus and epinucleus. The remaining cortex was then removed using the irrigation and aspiration handpiece. Provisc was then placed into the capsular bag to distend it for lens  placement. A +19.00 D [id number] intraocular lens was then injected into the capsular bag. The remaining viscoelastic was aspirated.   Wounds were hydrated with balanced salt solution.  The anterior chamber was inflated to a physiologic pressure with balanced salt solution.  No wound leaks were noted. Vigamox was injected intracamerally.  Timolol and Brimonidine drops were applied to the eye.  The patient was taken to the recovery room in stable condition without complications of anesthesia or surgery.  [employer] 09/06/2021, 8:16 AM

## 2021-09-07 ENCOUNTER — Encounter: Payer: Self-pay | Admitting: Ophthalmology

## 2021-09-13 ENCOUNTER — Institutional Professional Consult (permissible substitution): Payer: Medicare Other | Admitting: Pulmonary Disease

## 2021-09-17 ENCOUNTER — Other Ambulatory Visit: Payer: Self-pay | Admitting: Family Medicine

## 2021-09-18 NOTE — Telephone Encounter (Signed)
Name of Medication: Alprazolam Name of Pharmacy: [facility] or Written Date and Quantity: 06/19/21, #30 Last Office Visit and Type: 06/26/21, AWV Next Office Visit and Type: 12/31/21, 6 mo f/u Last Controlled Substance Agreement Date: none Last UDS: none  Gabapentin last filled:  06/18/21, #180.

## 2021-09-20 NOTE — Telephone Encounter (Signed)
ERx 

## 2021-09-25 ENCOUNTER — Other Ambulatory Visit: Payer: Self-pay | Admitting: Internal Medicine

## 2021-09-25 DIAGNOSIS — E1122 Type 2 diabetes mellitus with diabetic chronic kidney disease: Secondary | ICD-10-CM

## 2021-09-26 ENCOUNTER — Other Ambulatory Visit: Payer: Self-pay | Admitting: Cardiovascular Disease

## 2021-10-01 ENCOUNTER — Encounter: Payer: Self-pay | Admitting: Family Medicine

## 2021-10-01 DIAGNOSIS — M5136 Other intervertebral disc degeneration, lumbar region: Secondary | ICD-10-CM | POA: Diagnosis not present

## 2021-10-01 DIAGNOSIS — Z79891 Long term (current) use of opiate analgesic: Secondary | ICD-10-CM | POA: Diagnosis not present

## 2021-10-01 DIAGNOSIS — Z79899 Other long term (current) drug therapy: Secondary | ICD-10-CM | POA: Diagnosis not present

## 2021-10-01 DIAGNOSIS — M5416 Radiculopathy, lumbar region: Secondary | ICD-10-CM | POA: Diagnosis not present

## 2021-10-01 DIAGNOSIS — Z5181 Encounter for therapeutic drug level monitoring: Secondary | ICD-10-CM | POA: Diagnosis not present

## 2021-10-01 DIAGNOSIS — M5412 Radiculopathy, cervical region: Secondary | ICD-10-CM | POA: Diagnosis not present

## 2021-10-01 DIAGNOSIS — D6869 Other thrombophilia: Secondary | ICD-10-CM | POA: Diagnosis not present

## 2021-10-10 ENCOUNTER — Encounter: Payer: Self-pay | Admitting: Pulmonary Disease

## 2021-10-10 ENCOUNTER — Ambulatory Visit (INDEPENDENT_AMBULATORY_CARE_PROVIDER_SITE_OTHER): Payer: Medicare Other | Admitting: Pulmonary Disease

## 2021-10-10 VITALS — BP 140/70 | HR 61 | Ht 65.0 in | Wt 258.0 lb

## 2021-10-10 DIAGNOSIS — Z9989 Dependence on other enabling machines and devices: Secondary | ICD-10-CM | POA: Diagnosis not present

## 2021-10-10 DIAGNOSIS — I6523 Occlusion and stenosis of bilateral carotid arteries: Secondary | ICD-10-CM

## 2021-10-10 DIAGNOSIS — G4733 Obstructive sleep apnea (adult) (pediatric): Secondary | ICD-10-CM | POA: Diagnosis not present

## 2021-10-10 NOTE — Progress Notes (Signed)
[facility]  Chief Complaint  Patient presents with   Consult    CPAP compliance     Past Surgical History:  He  has a past surgical history that includes Appendectomy (1968); Back surgery (1977, 06/14/2008); Cardiac catheterization; Tonsillectomy; Carpal tunnel release (Right, 10/2017); Shoulder arthroscopy w/ rotator cuff repair (Right); Lithotripsy; Cataract extraction w/PHACO (Left, 05/30/2014); Parathyroidectomy (Right, 04/18/2016); Colonoscopy (10/2002); Colonoscopy (01/2005); IR URETERAL STENT LEFT NEW ACCESS W/O SEP NEPHROSTOMY CATH (10/19/2018); IR NEPHROSTOMY PLACEMENT LEFT (10/19/2018); Nephrolithotomy (Left, 10/19/2018); LEFT HEART CATH AND CORONARY ANGIOGRAPHY (N/A, 11/01/2020); Cataract extraction w/PHACO (Right, 09/06/2021); and Epidural block injection (Bilateral, 06/2021).  Past Medical History:  CHF, Anemia, Anxiety, Back pain, Bell's palsy, CKD 3, Colon polyp, COVID January 2022, Depression, DM type 2, DVT with PE 2009, GERD, Gout, Nephrolithiasis, HTN, Neuropathy, CAD, Vertigo  Constitutional:  BP (!) 140/70 (BP Location: Left Arm)   Pulse 61   Ht '5\' 5"'$  (1.651 m)   Wt 258 lb (117 kg)   SpO2 93%   BMI 42.93 kg/m   Brief Summary:  Donald [name] is a 76 y.o. male with obstructive sleep apnea.      Subjective:   He has been followed by Dr. Elsworth Soho.  Last appointment was in 2016.  He had sleep study in 2000 that showed severe sleep.  Got his current CPAP in 2018 from [facility].  He was told by [employer] that he needs follow up appointment before getting a new machine.  He is not having any issues with pressure setting or mask fit.  He goes to bed at midnight and wakes up at 10 am.  He was using xanax to help sleep, but this caused a hangover effect the next day.  He hasn't used xanax in the past week.  Physical Exam:   Appearance - well kempt   ENMT - no sinus tenderness, no oral exudate, no LAN, Mallampati 4 airway, no  stridor  Respiratory - equal breath sounds bilaterally, no wheezing or rales  CV - s1s2 regular rate and rhythm, no murmurs  Ext - no clubbing, no edema  Skin - no rashes  Psych - normal mood and affect   Sleep Tests:  PSG 06/26/98 >> AHI 138.4, SpO2 low 79%  Cardiac Tests:  Echo 10/31/20 >> EF 45 to 50%, grade 3 DD, mod LVH  Social History:  He  reports that he has never smoked. He has never used smokeless tobacco. He reports that he does not drink alcohol and does not use drugs.  Family History:  His family history includes Cancer in his mother and sister; Diabetes in his father and paternal grandfather; Heart disease in his father.     Assessment/Plan:   Obstructive sleep apnea. - he is compliant with CPAP and reports benefit from therapy - he uses [facility] for his DME - his current CPAP is more than 74 years old - will arrange for new Resmed CPAP at 15 cm H2O - he will then follow up with Dr. Elsworth Soho for further monitoring of his sleep apnea  Time Spent Involved in Patient Care on Day of Examination:  27 minutes  Follow up:   Patient Instructions  Will have [employer] arrange for a new Resmed CPAP at 15 cm water pressure   Follow up in with Dr. Elsworth Soho in 4 months  Medication List:   Allergies as of 10/10/2021       Reactions   Codeine Other (See Comments)   REACTION:  chest pain   Pioglitazone Other (See Comments)   REACTION to Actos: swelling in ankles        Medication List        Accurate as of October 10, 2021  3:15 PM. If you have any questions, ask your nurse or doctor.          albuterol 108 (90 Base) MCG/ACT inhaler Commonly known as: VENTOLIN HFA Inhale 2 puffs into the lungs every 6 (six) hours as needed for wheezing or shortness of breath.   allopurinol 300 MG tablet Commonly known as: ZYLOPRIM TAKE ONE TABLET BY MOUTH EVERY DAY   ALPRAZolam 1 MG tablet Commonly known as: XANAX TAKE 1 TABLET BY MOUTH AT BEDTIME IF NEEDED FOR SLEEP.    apixaban 5 MG Tabs tablet Commonly known as: ELIQUIS Take 1 tablet (5 mg total) by mouth 2 (two) times daily.   aspirin EC 81 MG tablet Take 81 mg by mouth daily. Swallow whole.   colchicine 0.6 MG tablet Commonly known as: Colcrys Take 1 tablet (0.6 mg total) by mouth daily as needed (gout flare). On first day of gout flare may take 2 tablets at once.   cyanocobalamin 1000 MCG/ML injection Commonly known as: VITAMIN B12 INJECT 1ML INTRAMUSCULARLY EVERY 30 DAYS   cyclobenzaprine 10 MG tablet Commonly known as: FLEXERIL Take 10 mg by mouth 3 (three) times daily as needed for muscle spasms.   doxazosin 1 MG tablet Commonly known as: Cardura Take 1 tablet (1 mg total) by mouth 2 (two) times daily.   fluticasone 50 MCG/ACT nasal spray Commonly known as: FLONASE Place 2 sprays into both nostrils daily.   FreeStyle Libre 2 Sensor Misc 1 Device by Does not apply route every 14 (fourteen) days.   furosemide 80 MG tablet Commonly known as: LASIX TAKE ONE TABLET (80 MG) BY MOUTH EVERY DAY take an extra lasix for swelling   gabapentin 600 MG tablet Commonly known as: NEURONTIN TAKE TWO TABLETS AT BEDTIME   GE100 Blood Glucose Test test strip Generic drug: glucose blood TEST FOUR TIMES DAILY   HYDROcodone-acetaminophen 10-325 MG tablet Commonly known as: NORCO Take 1 tablet by mouth every 6 (six) hours as needed for moderate pain ((typically once to twice daily if needed)).   insulin NPH Human 100 UNIT/ML injection Commonly known as: NovoLIN N Inject 2.2 mLs (220 Units total) into the skin at bedtime.   isosorbide mononitrate 30 MG 24 hr tablet Commonly known as: IMDUR Take 1 tablet (30 mg total) by mouth daily.   losartan 25 MG tablet Commonly known as: COZAAR Take 1 tablet (25 mg total) by mouth at bedtime.   lovastatin 40 MG tablet Commonly known as: MEVACOR TAKE TWO TABLETS BY MOUTH AT BEDTIME   meclizine 25 MG tablet Commonly known as: ANTIVERT Take 25 mg by  mouth 3 (three) times daily as needed for dizziness.   NovoLIN R 100 units/mL injection Generic drug: insulin regular INJECT 200 UNITS IN THE MORNING, 140 UNITS IN THE AFTERNOON, AND 280 UNITS IN THE EVENING - USE BEFORE A MEAL   ondansetron 4 MG tablet Commonly known as: ZOFRAN Take 1 tablet (4 mg total) by mouth every 4 (four) hours as needed for nausea.   OPCON-A OP Place 1 drop into both eyes 3 (three) times daily as needed (itchy eyes.).   Potassium Citrate 15 MEQ (1620 MG) Tbcr Take 1 tablet by mouth 3 (three) times daily.   potassium citrate 10 MEQ (1080 MG) SR tablet Commonly  known as: UROCIT-K Take 10 mEq by mouth 3 (three) times a week.   STOOL SOFTENER & LAXATIVE PO Take 2 tablets by mouth at bedtime.   SYRINGE 3CC/22GX1-1/2" 22G X 1-1/2" 3 ML Misc Use to administer monthly b12 shots   Vitamin D3 25 MCG (1000 UT) Caps Take 1 capsule (1,000 Units total) by mouth daily.        Signature:  Chesley Mires, MD [place] Pager - [phone] 10/10/2021, 3:15 PM

## 2021-10-10 NOTE — Patient Instructions (Signed)
Will have [facility] arrange for a new Resmed CPAP at 15 cm water pressure   Follow up in with Dr. Elsworth Soho in 4 months

## 2021-10-18 ENCOUNTER — Other Ambulatory Visit: Payer: Self-pay | Admitting: Family Medicine

## 2021-10-26 DIAGNOSIS — N1831 Chronic kidney disease, stage 3a: Secondary | ICD-10-CM | POA: Diagnosis not present

## 2021-10-30 ENCOUNTER — Ambulatory Visit (INDEPENDENT_AMBULATORY_CARE_PROVIDER_SITE_OTHER): Payer: Medicare Other | Admitting: Podiatry

## 2021-10-30 DIAGNOSIS — M778 Other enthesopathies, not elsewhere classified: Secondary | ICD-10-CM

## 2021-10-30 DIAGNOSIS — M19072 Primary osteoarthritis, left ankle and foot: Secondary | ICD-10-CM

## 2021-10-31 ENCOUNTER — Telehealth: Payer: Self-pay | Admitting: *Deleted

## 2021-10-31 NOTE — Chronic Care Management (AMB) (Signed)
  Care Coordination  Outreach Note  10/31/2021 Name: [name] MRN: [id number] DOB: Mar 30, 1945   Care Coordination Outreach Attempts: An unsuccessful telephone outreach was attempted today to offer the patient information about available care coordination services as a benefit of their health plan.   Follow Up Plan:  Additional outreach attempts will be made to offer the patient care coordination information and services.   Encounter Outcome:  No Answer  Julian Hy, [place] Direct Dial: [phone]

## 2021-11-01 DIAGNOSIS — E1122 Type 2 diabetes mellitus with diabetic chronic kidney disease: Secondary | ICD-10-CM | POA: Diagnosis not present

## 2021-11-01 DIAGNOSIS — N200 Calculus of kidney: Secondary | ICD-10-CM | POA: Diagnosis not present

## 2021-11-01 DIAGNOSIS — D631 Anemia in chronic kidney disease: Secondary | ICD-10-CM | POA: Diagnosis not present

## 2021-11-01 DIAGNOSIS — D351 Benign neoplasm of parathyroid gland: Secondary | ICD-10-CM | POA: Diagnosis not present

## 2021-11-01 DIAGNOSIS — N179 Acute kidney failure, unspecified: Secondary | ICD-10-CM | POA: Diagnosis not present

## 2021-11-01 DIAGNOSIS — N1831 Chronic kidney disease, stage 3a: Secondary | ICD-10-CM | POA: Diagnosis not present

## 2021-11-01 DIAGNOSIS — N2581 Secondary hyperparathyroidism of renal origin: Secondary | ICD-10-CM | POA: Diagnosis not present

## 2021-11-01 DIAGNOSIS — Z6841 Body Mass Index (BMI) 40.0 and over, adult: Secondary | ICD-10-CM | POA: Diagnosis not present

## 2021-11-01 DIAGNOSIS — I129 Hypertensive chronic kidney disease with stage 1 through stage 4 chronic kidney disease, or unspecified chronic kidney disease: Secondary | ICD-10-CM | POA: Diagnosis not present

## 2021-11-01 DIAGNOSIS — E870 Hyperosmolality and hypernatremia: Secondary | ICD-10-CM | POA: Diagnosis not present

## 2021-11-01 DIAGNOSIS — M109 Gout, unspecified: Secondary | ICD-10-CM | POA: Diagnosis not present

## 2021-11-01 DIAGNOSIS — G4733 Obstructive sleep apnea (adult) (pediatric): Secondary | ICD-10-CM | POA: Diagnosis not present

## 2021-11-06 ENCOUNTER — Other Ambulatory Visit: Payer: Self-pay | Admitting: Cardiovascular Disease

## 2021-11-06 DIAGNOSIS — M5416 Radiculopathy, lumbar region: Secondary | ICD-10-CM | POA: Diagnosis not present

## 2021-11-06 NOTE — Progress Notes (Signed)
  Subjective:  Patient ID: Donald [name], male    DOB: [birth year],  MRN: [id number]  Arthritis in left foot interested in injection  76 y.o. male presents with the above complaint. History confirmed with patient.  Flared up about a week ago and is very painful.  He will do another injection as the injection has helped.  Given 4 months of relief.  Objective:  Physical Exam: warm, good capillary refill, no trophic changes or ulcerative lesions, normal DP and PT pulses, normal sensory exam, and pain over the tarsometatarsal joints laterally  Assessment:   1. Osteoarthritis of left midfoot   2. Capsulitis of left foot        Plan:  Patient was evaluated and treated and all questions answered.  Discussed treatment options for his osteoarthritis he has responded well to injection therapy so far.  We will plan on doing another injection.  Following sterile prep with Betadine and utilizing aseptic technique 20 mg Kenalog was injected into the fourth tarsometatarsal joint space from a dorsal approach.  He tolerated as well as dressed with a Band-Aid.  Return as needed for further injections.  If the is no improvement we will discuss MRI options    No follow-ups on file.

## 2021-11-15 DIAGNOSIS — Z23 Encounter for immunization: Secondary | ICD-10-CM | POA: Diagnosis not present

## 2021-11-20 NOTE — Progress Notes (Signed)
  Care Coordination Note  11/20/2021 Name: Donald [name] MRN: [id number] DOB: 1945/06/19  Donald [name] is a 76 y.o. year old male who is a primary care patient of Ria Bush, MD and is actively engaged with the care management team. I reached out to [place] by phone today to assist with scheduling an initial visit with the RN Case Manager  Follow up plan: Telephone appointment with care management team member scheduled for: 11/27/2021  Julian Hy, [place] Direct Dial: [phone]

## 2021-11-27 ENCOUNTER — Ambulatory Visit: Payer: Self-pay

## 2021-11-27 NOTE — Patient Outreach (Signed)
  Care Coordination   Initial Visit Note   11/27/2021 Name: Donald [name] MRN: [id number] DOB: 04-17-1945  Donald [name] is a 76 y.o. year old male who sees Ria Bush, MD for primary care. I spoke with  Donald [name] by phone today.  What matters to the patients health and wellness today?  Patient states he has arthritis in his left foot that causes him quite of bit of pain. He reports seeing the podiatrist last month and having an injection for pain.  Patient states the injections don't help him much.  He reports using an over the counter pain cream that provides some relief.  Patient states he feels he is managing his conditions and follows up with his providers regularly.  Denies need for further follow up with RNCM.     Goals Addressed             This Visit's Progress    Care coordination - no further follow up       Care Coordination Interventions: Care coordination program/ services discussed  Social determinants of health survey completed Patient advised to contact primary care provider office  if care coordination services needed in the future. Reviewed scheduled/ upcoming provider visits            SDOH assessments and interventions completed:  Yes  SDOH Interventions Today    Flowsheet Row Most Recent Value  SDOH Interventions   Food Insecurity Interventions Intervention Not Indicated  Housing Interventions Intervention Not Indicated  Transportation Interventions Intervention Not Indicated        Care Coordination Interventions Activated:  Yes  Care Coordination Interventions:  Yes, provided   Follow up plan: No further intervention required.   Encounter Outcome:  Pt. Visit Completed   Donald Plowman RN,BSN,CCM [place] [phone] direct line

## 2021-12-01 DIAGNOSIS — R001 Bradycardia, unspecified: Secondary | ICD-10-CM | POA: Diagnosis not present

## 2021-12-04 ENCOUNTER — Other Ambulatory Visit: Payer: Self-pay | Admitting: Cardiovascular Disease

## 2021-12-16 NOTE — Progress Notes (Unsigned)
Date:  12/17/2021   ID:  Donald [name], DOB 1945-12-04, MRN [id number]  Patient Location:  [place] [address]   Provider location:   Donald [name], [place] office  PCP:  Donald Bush, MD  Cardiologist:  Donald [name] [facility]   Chief Complaint  Patient presents with   6 month follow up     "Doing well." Medications reviewed by the patient verbally.     History of Present Illness:    Donald [name] is a 76 y.o. male  past medical history of Long-standing diabetes, HBA1C 7.7 hyperlipidemia,  coronary artery disease,  Prior cardiac catheterization 2012 with 40% coronary disease noted Diffuse three-vessel coronary calcification on CT scan Cath 10/22: nonobstructive PAD,   chronic kidney disease, hypercalcemia,   OSA on CPAP,  Surgery of his parathyroid Chronic back pain Who presents for follow-up of his coronary artery disease, Paroxysmal afib, aortic valve stenosis, syncope October 2022  Last seen in clinic by myself May 2023 Not on eliquis, secondary to price, has been taking aspirin alone Was denied patient assistance for Eliquis Denies any tachypalpitations concerning for arrhythmia  One night, could not get a breath Called EMS, "BP fine, EKG ok", was not transported to the hospital  Does not feel he has extra fluid, remains on Lasix 80 daily Not been taking extra Lasix  Lab work reviewed A1C 7.5, followed by endocrine Total cholesterol 105 LDL 48 Creatinine 1.45 BUN 30  No regular exercise program, chronic knee pain, walks with a cane  EKG personally reviewed by myself on todays visit Normal sinus rhythm rate 62 bpm no significant ST-T wave changes  Other past medical history reviewed Prior hospitalization October 2022 for, Syncope , found on his garage floor Low potassium 2.9 , low sugar also noted to be in atrial fibrillation  Cardiac catheterization in October 2022 Mild, non-obstructive coronary  artery disease, as detailed below.  No significant stenosis identified to explain syncope or cardiomyopathy. Normal left ventricular filling pressure. Mild-moderate aortic valve stenosis based on peak-to-peak gradient (~20 mmHg).  Echo showed EF of 45 to 50% with global hypokinesis, grade III diastolic dysfunction.   Carotid ultrasound less than 50% stenosis bilaterally.  MRI with no acute intracranial findings  Other past medical history reviewed Abnormal EKG leading to stress testing on prior clinic visit This showed no significant ischemia   Had parathyroid, surgery Now with vertigo Following surgery Mild improvement with meclizine, though still symptomatic    previous PET/CT imaging   mild to moderate calcified plaque in the carotid arteries [name] greater than left Mild to moderate plaque in the aortic arch Diffuse three-vessel coronary calcifications He does have mild to moderate plaquing in the branches off the mid to distal descending aorta  staghorn left renal calculus   Recent parathyroid spect scan  showed a focal abnormal sestamibi location suspicious for [name] inferior parathyroid adenoma. Nonspecific tracer localization within single normal-sized left axillary and [name] subpectoral lymph node.    elective parathyroidectomy on 02/29/2016.    Past Medical History:  Diagnosis Date   (HFpEF) heart failure with preserved ejection fraction ([place])    a. 09/2018 Echo: EF 60-65%. PASP 66mHg. Mild-mod LAE. Mild MR/TR.   ANEMIA-IRON DEFICIENCY 07/26/2006   Anxiety    ASTHMA 07/26/2006   Asthma    Back pain, chronic    Bell's palsy    CKD (chronic kidney disease), stage III (HCC)    COLONIC POLYPS, HX OF  07/26/2006   COVID-19 virus infection 01/2020   DEPRESSION 03/14/2009   DIABETES MELLITUS, TYPE II 07/26/2006   [place] DISEASE, LUMBAR 10/05/2007   DVT 12/03/2007   DYSLIPIDEMIA 04/13/2009   Dyspnea    when gets up and walks around and back is hurting really bad -only  Shortness of breath  then   GERD 07/26/2006   Gout    Heart murmur    History of kidney stones    HYPERTENSION 07/26/2006   INSOMNIA 08/21/2007   Neuropathy    Nonobstructive CAD (coronary artery disease)    a. 2012 Cath: no high grade stenosis; b. 2018 MV: No ischemia. Attenuation artifact.    OBSTRUCTIVE SLEEP APNEA 12/03/2007   Use C-PAP   PERIPHERAL NEUROPATHY 07/26/2006   Pernicious anemia 11/20/2006   PULMONARY EMBOLISM 10/05/2007   Vertigo    Past Surgical History:  Procedure Laterality Date   [place], 06/14/2008   Dr. Trenton [name] at [facility] [phone])   [place] [name] 10/2017   CATARACT EXTRACTION W/PHACO Left 05/30/2014   Procedure: CATARACT EXTRACTION PHACO AND INTRAOCULAR LENS PLACEMENT ([place]);  Surgeon: Donald Cotta, MD;  Location: [facility] ORS;  Service: Ophthalmology;  Laterality: Left;  [name] 01:20 AP% 23.7 CDE 31.86   CATARACT EXTRACTION W/PHACO [name] 09/06/2021   Procedure: CATARACT EXTRACTION PHACO AND INTRAOCULAR LENS PLACEMENT ([place]) [name] DIABETIC VISION BLUE OMIDRIA MALYUGIN IRIS HOOKS;  Surgeon: Donald Richards, MD;  Location: [place];  Service: Ophthalmology;  Laterality: [name];  14.65 2.11.1   COLONOSCOPY  10/2002   HP, SSA, TA, rpt 3 yrs (Donald [name])   COLONOSCOPY  01/2005   diverticulosis rpt 5 yrs (Donald [name])    EPIDURAL BLOCK INJECTION Bilateral 06/2021   bilat L5 nerve block with benefit (Donald [name])   IR NEPHROSTOMY PLACEMENT LEFT  10/19/2018   IR URETERAL STENT LEFT NEW ACCESS W/O SEP NEPHROSTOMY CATH  10/19/2018   LEFT HEART CATH AND CORONARY ANGIOGRAPHY N/A 11/01/2020   Procedure: LEFT HEART CATH AND CORONARY ANGIOGRAPHY;  Surgeon: Donald Bush, MD;  Location: [facility];  Service: Cardiovascular;  Laterality: N/A;   LITHOTRIPSY     X 2   NEPHROLITHOTOMY Left 10/19/2018   Procedure: NEPHROLITHOTOMY PERCUTANEOUS;  Surgeon: Donald Gallo, MD;   Location: [facility];  Service: Urology;  Laterality: Left;  3 HRS   PARATHYROIDECTOMY [name] 04/18/2016   PARATHYROIDECTOMY for cyst Donald Manner, MD)   SHOULDER ARTHROSCOPY W/ ROTATOR CUFF REPAIR [name]    TONSILLECTOMY       Current Meds  Medication Sig   albuterol (VENTOLIN HFA) 108 (90 Base) MCG/ACT inhaler Inhale 2 puffs into the lungs every 6 (six) hours as needed for wheezing or shortness of breath.   allopurinol (ZYLOPRIM) 300 MG tablet TAKE ONE TABLET BY MOUTH EVERY DAY   ALPRAZolam (XANAX) 1 MG tablet TAKE 1 TABLET BY MOUTH AT BEDTIME IF NEEDED FOR SLEEP.   aspirin 81 MG EC tablet Take 81 mg by mouth daily. Swallow whole.   Cholecalciferol (VITAMIN D3) 25 MCG (1000 UT) CAPS Take 1 capsule (1,000 Units total) by mouth daily.   colchicine 0.6 MG tablet TAKE 1 TABLET BY MOUTH DAILY AS NEEDED FOR GOUT GLARE. ON FIRST DAY OF GOUT FLARE,MAY TAKE 2 TABLETS AT ONCE AS DIRECTED   Continuous Blood Gluc Sensor (FREESTYLE LIBRE 2 SENSOR) MISC 1 Device by Does not apply route every 14 (fourteen) days.   cyanocobalamin (,VITAMIN B-12,) 1000 MCG/ML  injection INJECT 1ML INTRAMUSCULARLY EVERY 30 DAYS   cyclobenzaprine (FLEXERIL) 10 MG tablet Take 10 mg by mouth 3 (three) times daily as needed for muscle spasms.   doxazosin (CARDURA) 1 MG tablet Take 1 tablet (1 mg total) by mouth 2 (two) times daily.   fluticasone (FLONASE) 50 MCG/ACT nasal spray Place 2 sprays into both nostrils daily.   furosemide (LASIX) 80 MG tablet TAKE ONE TABLET BY MOUTH EVERY DAY *MAY TAKE AN EXTRA PILL FOR SWELLING*   gabapentin (NEURONTIN) 600 MG tablet TAKE TWO TABLETS AT BEDTIME   glucose blood (GE100 BLOOD GLUCOSE TEST) test strip TEST FOUR TIMES DAILY   HYDROcodone-acetaminophen (NORCO) 10-325 MG tablet Take 1 tablet by mouth every 6 (six) hours as needed for moderate pain ((typically once to twice daily if needed)).    insulin NPH Human (NOVOLIN N) 100 UNIT/ML injection Inject 2.2 mLs (220 Units total) into the skin  at bedtime.   isosorbide mononitrate (IMDUR) 30 MG 24 hr tablet TAKE ONE TABLET BY MOUTH DAILY   losartan (COZAAR) 25 MG tablet Take 1 tablet (25 mg total) by mouth at bedtime.   lovastatin (MEVACOR) 40 MG tablet TAKE TWO TABLETS BY MOUTH AT BEDTIME   meclizine (ANTIVERT) 25 MG tablet Take 25 mg by mouth 3 (three) times daily as needed for dizziness.   Naphazoline-Pheniramine (OPCON-A OP) Place 1 drop into both eyes 3 (three) times daily as needed (itchy eyes.).    nitroGLYCERIN (NITROSTAT) 0.4 MG SL tablet Place 1 tablet (0.4 mg total) under the tongue every 5 (five) minutes as needed for chest pain.   NOVOLIN R 100 UNIT/ML injection INJECT 200 UNITS IN THE MORNING, 140 UNITS IN THE AFTERNOON, AND 280 UNITS IN THE EVENING - USE BEFORE A MEAL   ondansetron (ZOFRAN) 4 MG tablet Take 1 tablet (4 mg total) by mouth every 4 (four) hours as needed for nausea.   potassium citrate (UROCIT-K) 10 MEQ (1080 MG) SR tablet Take 10 mEq by mouth 3 (three) times a week.   Potassium Citrate 15 MEQ (1620 MG) TBCR Take 1 tablet by mouth 3 (three) times daily.   Sennosides-Docusate Sodium (STOOL SOFTENER & LAXATIVE PO) Take 2 tablets by mouth at bedtime.   Syringe/Needle, Disp, (SYRINGE 3CC/22GX1-1/2") 22G X 1-1/2" 3 ML MISC Use to administer monthly b12 shots     Allergies:   Codeine and Pioglitazone   Social History   Tobacco Use   Smoking status: Never   Smokeless tobacco: Never  Vaping Use   Vaping Use: Never used  Substance Use Topics   Alcohol use: No   Drug use: No      Family Hx: The patient's family history includes Cancer in his mother and sister; Diabetes in his father and paternal grandfather; Heart disease in his father.  ROS:   Please see the history of present illness.    Review of Systems  Constitutional: Negative.   HENT: Negative.    Respiratory: Negative.    Cardiovascular: Negative.   Gastrointestinal: Negative.   Musculoskeletal:  Positive for joint pain.  Neurological:  Negative.   Psychiatric/Behavioral: Negative.    All other systems reviewed and are negative.    Labs/Other Tests and Data Reviewed:    Recent Labs: 06/20/2021: ALT 14; BUN 30; Creatinine, Ser 1.45; Potassium 4.3; Sodium 142   Recent Lipid Panel Lab Results  Component Value Date/Time   CHOL 105 06/20/2021 09:07 AM   TRIG 104.0 06/20/2021 09:07 AM   HDL 36.30 (L) 06/20/2021 09:07  AM   CHOLHDL 3 06/20/2021 09:07 AM   LDLCALC 48 06/20/2021 09:07 AM   LDLDIRECT 52.2 05/25/2009 09:37 AM    Wt Readings from Last 3 Encounters:  12/17/21 259 lb 6 oz (117.7 kg)  10/10/21 258 lb (117 kg)  09/06/21 253 lb (114.8 kg)     Exam:    Vital Signs: Vital signs may also be detailed in the HPI BP (!) 140/60 (BP Location: Left Arm, Patient Position: Sitting, Cuff Size: Normal)   Pulse 62   Ht '5\' 6"'$  (1.676 m)   Wt 259 lb 6 oz (117.7 kg)   SpO2 95%   BMI 41.86 kg/m   Constitutional:  oriented to person, place, and time. No distress.  HENT:  Head: Grossly normal Eyes:  no discharge. No scleral icterus.  Neck: No JVD, no carotid bruits  Cardiovascular: Regular rate and rhythm, no murmurs appreciated Pulmonary/Chest: Clear to auscultation bilaterally, no wheezes or rails Abdominal: Soft.  no distension.  no tenderness.  Musculoskeletal: Normal range of motion Neurological:  normal muscle tone. Coordination normal. No atrophy Skin: Skin warm and dry Psychiatric: normal affect, pleasant  ASSESSMENT & PLAN:    Afib, paroxysmal Maintaining normal sinus rhythm Previously declined Eliquis secondary to price Did not receive patient assistance for Eliquis Discussed generic Pradaxa.  He prefers no change at this time  Essential hypertension -  Blood pressure is well controlled on today's visit. No changes made to the medications.  Dyslipidemia -  Cholesterol is at goal on the current lipid regimen. No changes to the medications were made.  Coronary artery disease involving native coronary  artery of native heart with angina pectoris ([place]) Significant three-vessel coronary calcification seen on  CT scan stress Myoview with no significant ischemia cardiac catheterization, October 2022 with nonobstructive disease Cholesterol at goal   Aortic atherosclerosis (HCC) Seen in the aortic arch and branches of the descending aorta Cholesterol at goal  Morbid obesity ([place]) We have encouraged continued  careful diet management in an effort to lose weight.  Type 2 diabetes mellitus without complication, without long-term current use of insulin (HCC) Hemoglobin A1c of 7.5, followed by endocrine Could consider Ozempic/Mounjaro  Lower extremity edema Leg edema stable, minimal Taking Lasix daily Renal function stable  Shortness of breath Recent episode, EMS called to the house Etiology unclear, unable to exclude arrhythmia versus pulmonary edema Recommended for recurrent episodes he take nitro, extra Lasix and call our office   Total encounter time more than 30 minutes  Greater than 50% was spent in counseling and coordination of care with the patient   Signed, Ida Rogue, MD  12/17/2021 12:32 PM    [place] Office [address], [place], [place] [address]

## 2021-12-17 ENCOUNTER — Ambulatory Visit: Payer: Medicare Other | Attending: Cardiovascular Disease | Admitting: Cardiovascular Disease

## 2021-12-17 ENCOUNTER — Encounter: Payer: Self-pay | Admitting: Cardiovascular Disease

## 2021-12-17 VITALS — BP 140/60 | HR 62 | Ht 66.0 in | Wt 259.4 lb

## 2021-12-17 DIAGNOSIS — I6523 Occlusion and stenosis of bilateral carotid arteries: Secondary | ICD-10-CM

## 2021-12-17 DIAGNOSIS — I420 Dilated cardiomyopathy: Secondary | ICD-10-CM

## 2021-12-17 DIAGNOSIS — I5032 Chronic diastolic (congestive) heart failure: Secondary | ICD-10-CM

## 2021-12-17 DIAGNOSIS — I25118 Atherosclerotic heart disease of native coronary artery with other forms of angina pectoris: Secondary | ICD-10-CM | POA: Diagnosis not present

## 2021-12-17 DIAGNOSIS — N183 Chronic kidney disease, stage 3 unspecified: Secondary | ICD-10-CM

## 2021-12-17 DIAGNOSIS — I10 Essential (primary) hypertension: Secondary | ICD-10-CM

## 2021-12-17 DIAGNOSIS — E782 Mixed hyperlipidemia: Secondary | ICD-10-CM

## 2021-12-17 DIAGNOSIS — R55 Syncope and collapse: Secondary | ICD-10-CM | POA: Diagnosis not present

## 2021-12-17 DIAGNOSIS — I739 Peripheral vascular disease, unspecified: Secondary | ICD-10-CM | POA: Diagnosis not present

## 2021-12-17 MED ORDER — NITROGLYCERIN 0.4 MG SL SUBL: 0.4000 mg | SUBLINGUAL_TABLET | SUBLINGUAL | 3 refills | Status: DC | PRN

## 2021-12-17 NOTE — Patient Instructions (Addendum)
Ask endocrine about ozempic/mounjaro  Medication Instructions:  We have sent in NTG as needed for shortness of breath or chest tightness  For breathing spell,  Take extra lasix and NTG  If you need a refill on your cardiac medications before your next appointment, please call your pharmacy.   Lab work: No new labs needed  Testing/Procedures: No new testing needed  Follow-Up: At [facility], you and your health needs are our priority.  As part of our continuing mission to provide you with exceptional heart care, we have created designated Provider Care Teams.  These Care Teams include your primary Cardiologist (physician) and Advanced Practice Providers (APPs -  Physician Assistants and Nurse Practitioners) who all work together to provide you with the care you need, when you need it.  You will need a follow up appointment in 6 months  Providers on your designated Care Team:   Murray Hodgkins, NP Christell Faith, PA-C Cadence Kathlen Mody, [place]  COVID-19 Vaccine Information can be found at: ShippingScam.co.uk For questions related to vaccine distribution or appointments, please email [email or web address] or call [phone].

## 2021-12-21 ENCOUNTER — Telehealth: Payer: Self-pay

## 2021-12-21 NOTE — Telephone Encounter (Signed)
Inbound fax requesting past 6 months of clinical notes pertaining to diabetes treatment and consultation. Pt has not been seen since April of 2023. Pt has upcoming appointment in March of 2024. No recent clinical notes to fax at this time.

## 2021-12-26 ENCOUNTER — Ambulatory Visit (INDEPENDENT_AMBULATORY_CARE_PROVIDER_SITE_OTHER): Payer: Medicare Other | Admitting: Podiatry

## 2021-12-26 ENCOUNTER — Ambulatory Visit (INDEPENDENT_AMBULATORY_CARE_PROVIDER_SITE_OTHER): Payer: Medicare Other

## 2021-12-26 ENCOUNTER — Other Ambulatory Visit: Payer: Self-pay | Admitting: Cardiovascular Disease

## 2021-12-26 DIAGNOSIS — M19072 Primary osteoarthritis, left ankle and foot: Secondary | ICD-10-CM

## 2021-12-26 NOTE — Patient Instructions (Addendum)
Look for a shoe at [employer] in [place] that has a "roll bar" such as [place]

## 2021-12-26 NOTE — Progress Notes (Signed)
  Subjective:  Patient ID: Donald [name], male    DOB: 03/30/1945,  MRN: [id number]  Arthritis in left foot interested in injection  76 y.o. male presents with the above complaint. History confirmed with patient.  Started about 2 days ago it has been very painful again.  The last injection again helped for about 2 months  Objective:  Physical Exam: warm, good capillary refill, no trophic changes or ulcerative lesions, normal DP and PT pulses, normal sensory exam, and pain over the tarsometatarsal joints laterally, most significantly over the fourth and fifth TMT  Assessment:   1. Osteoarthritis of left midfoot      Plan:  Patient was evaluated and treated and all questions answered.  Discussed treatment options for his osteoarthritis he has responded well to injection therapy so far.  Following sterile prep with Betadine and utilizing aseptic technique 20 mg Kenalog was injected into the fourth and fifth tarsometatarsal joint space from a dorsal approach.  He tolerated as well as dressed with a Band-Aid.  Return as needed for further injections.  We also discussed use of Voltaren gel topically.  I inspected his shoe gear today and I think it is too flexible nonsupportive he has had it for several years he said.  I recommended he obtain a stiff soled pair of walking shoes such as new balance with a roll bar to support the foot.  We also discussed custom molded orthoses if this did not improve.  He may look at carbon fiber orthoses  Return if symptoms worsen or fail to improve.

## 2021-12-31 ENCOUNTER — Encounter: Payer: Self-pay | Admitting: Family Medicine

## 2021-12-31 ENCOUNTER — Ambulatory Visit (INDEPENDENT_AMBULATORY_CARE_PROVIDER_SITE_OTHER): Payer: Medicare Other | Admitting: Family Medicine

## 2021-12-31 ENCOUNTER — Telehealth: Payer: Self-pay | Admitting: Family Medicine

## 2021-12-31 VITALS — BP 130/64 | HR 55 | Temp 97.4°F | Ht 66.0 in | Wt 255.2 lb

## 2021-12-31 DIAGNOSIS — N200 Calculus of kidney: Secondary | ICD-10-CM

## 2021-12-31 DIAGNOSIS — I6523 Occlusion and stenosis of bilateral carotid arteries: Secondary | ICD-10-CM

## 2021-12-31 DIAGNOSIS — Z794 Long term (current) use of insulin: Secondary | ICD-10-CM

## 2021-12-31 DIAGNOSIS — G4733 Obstructive sleep apnea (adult) (pediatric): Secondary | ICD-10-CM

## 2021-12-31 DIAGNOSIS — E1142 Type 2 diabetes mellitus with diabetic polyneuropathy: Secondary | ICD-10-CM

## 2021-12-31 DIAGNOSIS — I480 Paroxysmal atrial fibrillation: Secondary | ICD-10-CM

## 2021-12-31 DIAGNOSIS — N1832 Chronic kidney disease, stage 3b: Secondary | ICD-10-CM

## 2021-12-31 DIAGNOSIS — E1122 Type 2 diabetes mellitus with diabetic chronic kidney disease: Secondary | ICD-10-CM

## 2021-12-31 LAB — CBC WITH DIFFERENTIAL/PLATELET
Basophils Absolute: 0 10*3/uL (ref 0.0–0.1)
Basophils Relative: 0.5 % (ref 0.0–3.0)
Eosinophils Absolute: 0.3 10*3/uL (ref 0.0–0.7)
Eosinophils Relative: 2.7 % (ref 0.0–5.0)
HCT: 40.5 % (ref 39.0–52.0)
Hemoglobin: 13.5 g/dL (ref 13.0–17.0)
Lymphocytes Relative: 17.8 % (ref 12.0–46.0)
Lymphs Abs: 1.7 10*3/uL (ref 0.7–4.0)
MCHC: 33.2 g/dL (ref 30.0–36.0)
MCV: 88.4 fl (ref 78.0–100.0)
Monocytes Absolute: 0.5 10*3/uL (ref 0.1–1.0)
Monocytes Relative: 5.6 % (ref 3.0–12.0)
Neutro Abs: 7.1 10*3/uL (ref 1.4–7.7)
Neutrophils Relative %: 73.4 % (ref 43.0–77.0)
Platelets: 190 10*3/uL (ref 150.0–400.0)
RBC: 4.58 Mil/uL (ref 4.22–5.81)
RDW: 16.1 % — ABNORMAL HIGH (ref 11.5–15.5)
WBC: 9.7 10*3/uL (ref 4.0–10.5)

## 2021-12-31 LAB — RENAL FUNCTION PANEL
Albumin: 4.3 g/dL (ref 3.5–5.2)
BUN: 42 mg/dL — ABNORMAL HIGH (ref 6–23)
CO2: 31 mEq/L (ref 19–32)
Calcium: 9.3 mg/dL (ref 8.4–10.5)
Chloride: 100 mEq/L (ref 96–112)
Creatinine, Ser: 1.88 mg/dL — ABNORMAL HIGH (ref 0.40–1.50)
GFR: 34.88 mL/min — ABNORMAL LOW (ref >60.00)
Glucose, Bld: 87 mg/dL (ref 70–99)
Phosphorus: 3.9 mg/dL (ref 2.3–4.6)
Potassium: 4.4 mEq/L (ref 3.5–5.1)
Sodium: 143 mEq/L (ref 135–145)

## 2021-12-31 LAB — POCT GLYCOSYLATED HEMOGLOBIN (HGB A1C): Hemoglobin A1C: 7.9 % — AB (ref 4.0–5.6)

## 2021-12-31 MED ORDER — ONDANSETRON HCL 4 MG PO TABS: 4.0000 mg | ORAL_TABLET | Freq: Three times a day (TID) | ORAL | 0 refills | Status: DC | PRN

## 2021-12-31 MED ORDER — ONDANSETRON HCL 4 MG PO TABS: 4.0000 mg | ORAL_TABLET | ORAL | 0 refills | Status: DC | PRN

## 2021-12-31 NOTE — Patient Instructions (Addendum)
See if we can schedule pharmacist visit on this Wednesday for continuous glucose monitor teaching/placement.  Labs today  Continue current medicines.  Price out trulicity 0.'75mg'$  weekly - medicine sent to pharmacy. Don't start until you get continuous glucose monitor. If you start trulicity, drop novolin R by 50 units each dose as well as NPH to 200 units daily.  Return in 3 months for follow up visit.

## 2021-12-31 NOTE — Telephone Encounter (Signed)
New instructions sent to pharmacy.

## 2021-12-31 NOTE — Progress Notes (Unsigned)
Patient ID: Donald [name], male    DOB: 05-May-1945, 76 y.o.   MRN: [id number]  This visit was conducted in person.  BP 130/64   Pulse (!) 55   Temp (!) 97.4 F (36.3 C) (Temporal)   Ht '5\' 6"'$  (1.676 m)   Wt 255 lb 3.2 oz (115.8 kg)   SpO2 96%   BMI 41.19 kg/m    CC: 6 mo f/u visit  Subjective:   HPI: Donald [name] is a 76 y.o. male presenting on 12/31/2021 for Follow-up (Here for 6 mo f/u.)   DM - previously saw Dr Loanne Drilling, to establish with Dr Kelton Pillar 03/2022. Currently on NPH 220u nightly with novolin R 200/140/280u daily with meals. Has Freestyle Libre 2 CGM - but hasn't had a chance to place onto arm so instead using GE meter to check sugars 3-4 times daily. Last night before supper cbg 56 - he adjusted and skipped dinner insulin last night. He is on gabapentin '1200mg'$  nightly for neuropathy. Previously on Mounjaro, stopped 2022 due to cost.  Lab Results  Component Value Date   HGBA1C 7.9 (A) 12/31/2021    Known CAD, paroxysmal Afib and HFpEF followed by Dr Rockey Situ. He continues aspirin '81mg'$  daily, cardura '1mg'$  BID, lasix '80mg'$  daily, imdur '30mg'$  daily, losartan '25mg'$  daily, lovastatin '80mg'$  daily. He continues potassium 60mq TID. Amlodipine caused ankle swelling.  Had difficulty affording Eliquis, remains off at this time. Only continues aspirin. Did not qualify for patient assistance for Eliquis. Planning to reassess affordability 01/2022.   OSA on CPAP followed by pulm Dr SArn Medal Last seen 09/2021 - CPAP changed to Resmed at 15cm water pressure, [employer].   Requests zofran refilled PRN nausea. This is infrequent. Wonders if hydrocodone related nausea/constipation.      Relevant past medical, surgical, family and social history reviewed and updated as indicated. Interim medical history since our last visit reviewed. Allergies and medications reviewed and updated. Outpatient Medications Prior to Visit  Medication Sig Dispense Refill   albuterol (VENTOLIN  HFA) 108 (90 Base) MCG/ACT inhaler Inhale 2 puffs into the lungs every 6 (six) hours as needed for wheezing or shortness of breath. 8 g 0   allopurinol (ZYLOPRIM) 300 MG tablet TAKE ONE TABLET BY MOUTH EVERY DAY 90 tablet 3   ALPRAZolam (XANAX) 1 MG tablet TAKE 1 TABLET BY MOUTH AT BEDTIME IF NEEDED FOR SLEEP. 30 tablet 0   aspirin 81 MG EC tablet Take 81 mg by mouth daily. Swallow whole.     Cholecalciferol (VITAMIN D3) 25 MCG (1000 UT) CAPS Take 1 capsule (1,000 Units total) by mouth daily. 30 capsule    colchicine 0.6 MG tablet TAKE 1 TABLET BY MOUTH DAILY AS NEEDED FOR GOUT GLARE. ON FIRST DAY OF GOUT FLARE,MAY TAKE 2 TABLETS AT ONCE AS DIRECTED 30 tablet 0   Continuous Blood Gluc Sensor (FREESTYLE LIBRE 2 SENSOR) MISC 1 Device by Does not apply route every 14 (fourteen) days. 6 each 3   cyanocobalamin (,VITAMIN B-12,) 1000 MCG/ML injection INJECT 1ML INTRAMUSCULARLY EVERY 30 DAYS 1 mL 3   cyclobenzaprine (FLEXERIL) 10 MG tablet Take 10 mg by mouth 3 (three) times daily as needed for muscle spasms.     fluticasone (FLONASE) 50 MCG/ACT nasal spray Place 2 sprays into both nostrils daily. 16 g 1   furosemide (LASIX) 80 MG tablet TAKE ONE TABLET BY MOUTH EVERY DAY *MAY TAKE AN EXTRA PILL FOR SWELLING* 100 tablet 0   gabapentin (NEURONTIN) 600 MG  tablet TAKE TWO TABLETS AT BEDTIME 180 tablet 3   glucose blood (GE100 BLOOD GLUCOSE TEST) test strip TEST FOUR TIMES DAILY 200 each 10   HYDROcodone-acetaminophen (NORCO) 10-325 MG tablet Take 1 tablet by mouth every 6 (six) hours as needed for moderate pain ((typically once to twice daily if needed)).  30 tablet 0   insulin NPH Human (NOVOLIN N) 100 UNIT/ML injection Inject 2.2 mLs (220 Units total) into the skin at bedtime. 220 mL 1   isosorbide mononitrate (IMDUR) 30 MG 24 hr tablet TAKE ONE TABLET BY MOUTH DAILY 90 tablet 0   losartan (COZAAR) 25 MG tablet Take 1 tablet (25 mg total) by mouth at bedtime. 90 tablet 3   lovastatin (MEVACOR) 40 MG tablet  TAKE TWO TABLETS BY MOUTH AT BEDTIME 180 tablet 1   meclizine (ANTIVERT) 25 MG tablet Take 25 mg by mouth 3 (three) times daily as needed for dizziness.     Naphazoline-Pheniramine (OPCON-A OP) Place 1 drop into both eyes 3 (three) times daily as needed (itchy eyes.).      nitroGLYCERIN (NITROSTAT) 0.4 MG SL tablet Place 1 tablet (0.4 mg total) under the tongue every 5 (five) minutes as needed for chest pain. 25 tablet 3   NOVOLIN R 100 UNIT/ML injection INJECT 200 UNITS IN THE MORNING, 140 UNITS IN THE AFTERNOON, AND 280 UNITS IN THE EVENING - USE BEFORE A MEAL 210 mL 0   Potassium Citrate 15 MEQ (1620 MG) TBCR Take 1 tablet by mouth 3 (three) times daily.     Sennosides-Docusate Sodium (STOOL SOFTENER & LAXATIVE PO) Take 2 tablets by mouth at bedtime.     Syringe/Needle, Disp, (SYRINGE 3CC/22GX1-1/2") 22G X 1-1/2" 3 ML MISC Use to administer monthly b12 shots 50 each 0   doxazosin (CARDURA) 1 MG tablet Take 1 tablet (1 mg total) by mouth 2 (two) times daily. 180 tablet 3   ondansetron (ZOFRAN) 4 MG tablet Take 1 tablet (4 mg total) by mouth every 4 (four) hours as needed for nausea. 10 tablet 0   potassium citrate (UROCIT-K) 10 MEQ (1080 MG) SR tablet Take 10 mEq by mouth 3 (three) times a week.     No facility-administered medications prior to visit.     Per HPI unless specifically indicated in ROS section below Review of Systems  Objective:  BP 130/64   Pulse (!) 55   Temp (!) 97.4 F (36.3 C) (Temporal)   Ht '5\' 6"'$  (1.676 m)   Wt 255 lb 3.2 oz (115.8 kg)   SpO2 96%   BMI 41.19 kg/m   Wt Readings from Last 3 Encounters:  12/31/21 255 lb 3.2 oz (115.8 kg)  12/17/21 259 lb 6 oz (117.7 kg)  10/10/21 258 lb (117 kg)      Physical Exam Vitals and nursing note reviewed.  Constitutional:      Appearance: Normal appearance. He is obese. He is not ill-appearing.     Comments: Ambulates with cane  Eyes:     Extraocular Movements: Extraocular movements intact.     Conjunctiva/sclera:  Conjunctivae normal.     Pupils: Pupils are equal, round, and reactive to light.  Cardiovascular:     Rate and Rhythm: Normal rate and regular rhythm.     Pulses: Normal pulses.     Heart sounds: Normal heart sounds. No murmur heard.    Comments: Sounds regular Pulmonary:     Effort: Pulmonary effort is normal. No respiratory distress.     Breath sounds: Normal  breath sounds. No wheezing, rhonchi or rales.  Musculoskeletal:     Right lower leg: No edema.     Left lower leg: No edema.     Comments: See HPI for foot exam if done  Skin:    General: Skin is warm and dry.     Findings: No rash.  Neurological:     Mental Status: He is alert.  Psychiatric:        Mood and Affect: Mood normal.        Behavior: Behavior normal.       Results for orders placed or performed in visit on 12/31/21  Renal function panel  Result Value Ref Range   Sodium 143 135 - 145 mEq/L   Potassium 4.4 3.5 - 5.1 mEq/L   Chloride 100 96 - 112 mEq/L   CO2 31 19 - 32 mEq/L   Albumin 4.3 3.5 - 5.2 g/dL   BUN 42 (H) 6 - 23 mg/dL   Creatinine, Ser 1.88 (H) 0.40 - 1.50 mg/dL   Glucose, Bld 87 70 - 99 mg/dL   Phosphorus 3.9 2.3 - 4.6 mg/dL   GFR 34.88 (L) >60.00 mL/min   Calcium 9.3 8.4 - 10.5 mg/dL  CBC with Differential/Platelet  Result Value Ref Range   WBC 9.7 4.0 - 10.5 K/uL   RBC 4.58 4.22 - 5.81 Mil/uL   Hemoglobin 13.5 13.0 - 17.0 g/dL   HCT 40.5 39.0 - 52.0 %   MCV 88.4 78.0 - 100.0 fl   MCHC 33.2 30.0 - 36.0 g/dL   RDW 16.1 (H) 11.5 - 15.5 %   Platelets 190.0 150.0 - 400.0 K/uL   Neutrophils Relative % 73.4 43.0 - 77.0 %   Lymphocytes Relative 17.8 12.0 - 46.0 %   Monocytes Relative 5.6 3.0 - 12.0 %   Eosinophils Relative 2.7 0.0 - 5.0 %   Basophils Relative 0.5 0.0 - 3.0 %   Neutro Abs 7.1 1.4 - 7.7 K/uL   Lymphs Abs 1.7 0.7 - 4.0 K/uL   Monocytes Absolute 0.5 0.1 - 1.0 K/uL   Eosinophils Absolute 0.3 0.0 - 0.7 K/uL   Basophils Absolute 0.0 0.0 - 0.1 K/uL  POCT glycosylated hemoglobin  (Hb A1C)  Result Value Ref Range   Hemoglobin A1C 7.9 (A) 4.0 - 5.6 %   HbA1c POC (<> result, manual entry)     HbA1c, POC (prediabetic range)     HbA1c, POC (controlled diabetic range)      Assessment & Plan:   Problem List Items Addressed This Visit     OSA (obstructive sleep apnea)    Continues regular CPAP use, followed by pulmonology, last seen 09/2021.       Type 2 diabetes mellitus with diabetic polyneuropathy (HCC) - Primary    Chronic, uncontrolled, with both high and low readings. He's checking sugars 3-4 times daily with fingerstick. Pending establishing with new endo 03/2022. Continues NPH and novolin R. Also continues nightly gabapentin.  Has been unable to use Freestyle Libre 2 - will have him return Wed for CGM teaching with our pharmacist Mendel Ryder. He would really benefit from CGM use.  I asked him to price out trulicity - sent to pharmacy. If started, discussed lowering NPH and novolin R dose.       Relevant Orders   POCT glycosylated hemoglobin (Hb A1C) (Completed)   Renal function panel (Completed)   CBC with Differential/Platelet (Completed)   Type 2 diabetes mellitus with diabetic chronic kidney disease ([facility])  Update renal panel.      Paroxysmal atrial fibrillation (HCC)    Remains off anticoagulation due to cost. Planning to reassess affordability in the new year.         Meds ordered this encounter  Medications   DISCONTD: ondansetron (ZOFRAN) 4 MG tablet    Sig: Take 1 tablet (4 mg total) by mouth every 4 (four) hours as needed for nausea.    Dispense:  20 tablet    Refill:  0   Orders Placed This Encounter  Procedures   Renal function panel   CBC with Differential/Platelet   POCT glycosylated hemoglobin (Hb A1C)     Patient Instructions  See if we can schedule pharmacist visit on this Wednesday for continuous glucose monitor teaching/placement.  Labs today  Continue current medicines.  Price out trulicity 0.'75mg'$  weekly - medicine sent to  pharmacy. Don't start until you get continuous glucose monitor. If you start trulicity, drop novolin R by 50 units each dose as well as NPH to 200 units daily.  Return in 3 months for follow up visit.   Follow up plan: Return in about 3 months (around 04/01/2022) for follow up visit.  Ria Bush, MD

## 2021-12-31 NOTE — Telephone Encounter (Signed)
ondansetron (ZOFRAN) 4 MG tablet   Script sent over for: Take 1 tablet (4 mg total) by mouth every 4 (four) hours as needed for nausea., Starting Mon 12/31/2021, Normal   Normally every 6 hours, pharmacist needs to confirm  Please call them and advise.

## 2022-01-01 ENCOUNTER — Other Ambulatory Visit: Payer: Self-pay | Admitting: Cardiovascular Disease

## 2022-01-01 NOTE — Telephone Encounter (Signed)
Noted  

## 2022-01-02 ENCOUNTER — Other Ambulatory Visit: Payer: Self-pay | Admitting: Internal Medicine

## 2022-01-02 ENCOUNTER — Ambulatory Visit: Payer: Medicare Other | Admitting: Pharmacist

## 2022-01-02 ENCOUNTER — Other Ambulatory Visit: Payer: Medicare Other

## 2022-01-02 ENCOUNTER — Other Ambulatory Visit: Payer: Self-pay | Admitting: Family Medicine

## 2022-01-02 DIAGNOSIS — N1832 Chronic kidney disease, stage 3b: Secondary | ICD-10-CM

## 2022-01-02 DIAGNOSIS — Z794 Long term (current) use of insulin: Secondary | ICD-10-CM

## 2022-01-02 DIAGNOSIS — E1122 Type 2 diabetes mellitus with diabetic chronic kidney disease: Secondary | ICD-10-CM

## 2022-01-02 DIAGNOSIS — I480 Paroxysmal atrial fibrillation: Secondary | ICD-10-CM

## 2022-01-02 DIAGNOSIS — I10 Essential (primary) hypertension: Secondary | ICD-10-CM

## 2022-01-02 DIAGNOSIS — N200 Calculus of kidney: Secondary | ICD-10-CM

## 2022-01-02 DIAGNOSIS — E1169 Type 2 diabetes mellitus with other specified complication: Secondary | ICD-10-CM

## 2022-01-02 DIAGNOSIS — I25118 Atherosclerotic heart disease of native coronary artery with other forms of angina pectoris: Secondary | ICD-10-CM

## 2022-01-02 NOTE — Progress Notes (Signed)
Chronic Care Management Pharmacy Note  01/02/2022 Name:  Donald [name] MRN:  [id number] DOB:  1945/02/18  Summary: F/U visit Patient presented for [facility] 3 training.  Demonstrated with teach-back: -components of sensor and how to prepare sensor for application -how to apply sensor to arm. Patient successfully applied sensor to L arm today -how to navigate [employer] app in order to pair sensor; sensor was successfully paired today -how to adjust high/low glucose alarms. Low alarm set to 70 and high alarm set to 300 today. -App users: demonstrated navigating various components including settings, logbook, and daily patterns  Educated patient on: -viewing reader at any time to view real-time glucose readings (new reading every 1 minute) -changing sensor every 14 days or as initiated by app/reader. Each new sensor has 60-minute warmup period. -avoiding high doses of Vitamin C > 500 mg/day -removing sensor prior to MRI, CT scans -difference between blood glucose and "sensor" glucose -if glucose readings ever seem wrong/questionable, check glucose with a finger stick -Libreview cloud monitoring: Patient did enroll in [place]  Provided customer service line for [employer] in case of sensor breakdowns: [phone]   Pt voiced understanding of above and denies further questions.  Plan: -Pharmacist follow up televisit scheduled for 1 month -PCP appt 04/01/22; endocrine appt 04/02/22 [facility])    Subjective: Donald [name] is an 76 y.o. year old male who is a primary patient of Ria Bush, MD.  The CCM team was consulted for assistance with disease management and care coordination needs.    Engaged with patient face to face for follow up visit in response to provider referral for pharmacy case management and/or care coordination services.   Consent to Services:  The patient was given information about Chronic Care Management services, agreed to  services, and gave verbal consent prior to initiation of services.  Please see initial visit note for detailed documentation.   Patient Care Team: Ria Bush, MD as PCP - General (Family Medicine) Suella Broad, MD (Physical Medicine and Rehabilitation) Earnie Larsson, MD as Attending Physician (Neurosurgery) Rigoberto Noel, MD as Consulting Physician (Pulmonary Disease) Dingeldein, Remo Lipps, MD (Ophthalmology) Minna Merritts, MD as Consulting Physician (Cardiology) Charlton Haws, [facility] as Pharmacist (Pharmacist)  Recent office visits: 12/31/21 Dr Danise Mina OV: f/u - A1c 7.9%. GFR down 34. Request urine sample. DM - establishing new endo 03/2022. Asked to price out Trulicity  Recent consult visits: 12/17/21 Dr Rockey Situ (Cardiology): f/u - pt off Eliquis d/t cost. Taking aspirin. Rx'd NTG.  10/10/21 Dr Halford Chessman (Pulmonary): OSA - renewed CPAP  05/14/21 Dr Rockey Situ (Cardiology): f/u - Increased lovastatin to 80 mg.  05/02/21 Dr Loanne Drilling (Endocrine): f/u DM - A1c 7.3%. Reduce NPH to 220 u HS. F/U 3 months.  Hospital visits: None in previous 6 months   Objective:  Lab Results  Component Value Date   CREATININE 1.88 (H) 12/31/2021   BUN 42 (H) 12/31/2021   GFR 34.88 (L) 12/31/2021   GFRNONAA 46 (L) 11/02/2020   GFRAA 52 (L) 10/19/2018   NA 143 12/31/2021   K 4.4 12/31/2021   CALCIUM 9.3 12/31/2021   CO2 31 12/31/2021   GLUCOSE 87 12/31/2021    Lab Results  Component Value Date/Time   HGBA1C 7.9 (A) 12/31/2021 10:42 AM   HGBA1C 7.5 (H) 06/20/2021 09:07 AM   HGBA1C 7.3 (A) 05/02/2021 11:34 AM   HGBA1C 7.0 (H) 10/30/2020 02:13 PM   FRUCTOSAMINE 319 (H) 10/21/2017 04:54 PM   FRUCTOSAMINE 296 (H) 03/11/2011 10:48  AM   GFR 34.88 (L) 12/31/2021 11:03 AM   GFR 47.82 (L) 06/20/2021 09:07 AM   MICROALBUR 12.0 (H) 03/02/2015 11:42 AM   MICROALBUR 2.6 (H) 01/27/2014 11:49 AM    Last diabetic Eye exam:  Lab Results  Component Value Date/Time   HMDIABEYEEXA Retinopathy (A)  05/21/2017 03:54 PM    Last diabetic Foot exam: No results found for: "HMDIABFOOTEX"   Lab Results  Component Value Date   CHOL 105 06/20/2021   HDL 36.30 (L) 06/20/2021   LDLCALC 48 06/20/2021   LDLDIRECT 52.2 05/25/2009   TRIG 104.0 06/20/2021   CHOLHDL 3 06/20/2021       Latest Ref Rng & Units 12/31/2021   11:03 AM 06/20/2021    9:07 AM 11/15/2020   11:57 AM  Hepatic Function  Total Protein 6.0 - 8.3 g/dL  6.1    Albumin 3.5 - 5.2 g/dL 4.3  4.1  3.9   AST 0 - 37 U/L  14    ALT 0 - 53 U/L  14    Alk Phosphatase 39 - 117 U/L  91    Total Bilirubin 0.2 - 1.2 mg/dL  1.3      Lab Results  Component Value Date/Time   TSH 2.156 10/31/2020 01:55 PM   TSH 2.448 10/30/2020 02:13 PM   TSH 3.01 03/09/2020 02:12 PM   TSH 3.14 10/21/2017 04:54 PM   FREET4 1.04 10/30/2020 02:13 PM       Latest Ref Rng & Units 12/31/2021   11:03 AM 11/02/2020    2:51 AM 11/01/2020    7:02 AM  CBC  WBC 4.0 - 10.5 K/uL 9.7  7.5  6.2   Hemoglobin 13.0 - 17.0 g/dL 13.5  13.8  13.6   Hematocrit 39.0 - 52.0 % 40.5  41.9  43.3   Platelets 150.0 - 400.0 K/uL 190.0  195  177     Lab Results  Component Value Date/Time   VD25OH 55.84 06/20/2021 09:07 AM   VD25OH 32.87 06/19/2020 08:24 AM   VITAMINB12 560 06/20/2021 09:07 AM   VITAMINB12 >1550 (H) 06/19/2020 08:24 AM    Clinical ASCVD: Yes  The ASCVD Risk score (Arnett DK, et al., 2019) failed to calculate for the following reasons:   The patient has a prior MI or stroke diagnosis       06/26/2021   12:52 PM 06/21/2020    2:46 PM 04/20/2019    2:06 PM  Depression screen PHQ 2/9  Decreased Interest 3 0 0  Down, Depressed, Hopeless 0 0 0  PHQ - 2 Score 3 0 0  Altered sleeping 0 0 0  Tired, decreased energy 1 0 0  Change in appetite 0 0 0  Feeling bad or failure about yourself  0 0 0  Trouble concentrating 0 0 0  Moving slowly or fidgety/restless 0 0 0  Suicidal thoughts 0 0 0  PHQ-9 Score 4 0 0  Difficult doing work/chores Not difficult at  all  Not difficult at all     CHA2DS2/VAS Stroke Risk Points  Current as of 12 minutes ago     7 >= 2 Points: High Risk  1 - 1.99 Points: Medium Risk  0 Points: Low Risk    Last Change: N/A      Details    This score determines the patient's risk of having a stroke if the  patient has atrial fibrillation.       Points Metrics  1 Has Congestive Heart Failure:  Yes    Current as of 12 minutes ago  1 Has Vascular Disease:  Yes    Current as of 12 minutes ago  1 Has Hypertension:  Yes    Current as of 12 minutes ago  1 Age:  59    Current as of 12 minutes ago  1 Has Diabetes:  Yes    Current as of 12 minutes ago  2 Had Stroke:  No  Had TIA:  No  Had Thromboembolism:  Yes    Current as of 12 minutes ago  0 Male:  No    Current as of 12 minutes ago     Social History   Tobacco Use  Smoking Status Never  Smokeless Tobacco Never   BP Readings from Last 3 Encounters:  12/31/21 130/64  12/17/21 (!) 140/60  10/10/21 (!) 140/70   Pulse Readings from Last 3 Encounters:  12/31/21 (!) 55  12/17/21 62  10/10/21 61   Wt Readings from Last 3 Encounters:  12/31/21 255 lb 3.2 oz (115.8 kg)  12/17/21 259 lb 6 oz (117.7 kg)  10/10/21 258 lb (117 kg)   BMI Readings from Last 3 Encounters:  12/31/21 41.19 kg/m  12/17/21 41.86 kg/m  10/10/21 42.93 kg/m    Assessment/Interventions: Review of patient past medical history, allergies, medications, health status, including review of consultants reports, laboratory and other test data, was performed as part of comprehensive evaluation and provision of chronic care management services.   SDOH:  (Social Determinants of Health) assessments and interventions performed: No SDOH Interventions    Flowsheet Row Care Coordination from 11/27/2021 in [place] Management from 07/31/2020 in [place] at [place] Management from 03/10/2020 in [place] at  [place] from 04/20/2019 in [place] at [place] Interventions Intervention Not Indicated -- -- --  Housing Interventions Intervention Not Indicated -- -- --  Transportation Interventions Intervention Not Indicated -- -- --  Depression Interventions/Treatment  -- -- -- MAU6-3 Score <4 Follow-up Not Indicated  Financial Strain Interventions -- Other (Comment)  [Restart PAP for insulins] --  [Apply for insulin assistance] --      SDOH Screenings   Food Insecurity: No Food Insecurity (11/27/2021)  Housing: Low Risk  (11/27/2021)  Transportation Needs: No Transportation Needs (11/27/2021)  Alcohol Screen: Low Risk  (04/20/2019)  Depression (PHQ2-9): Low Risk  (06/26/2021)  Financial Resource Strain: Medium Risk (07/31/2020)  Physical Activity: Inactive (04/20/2019)  Stress: No Stress Concern Present (04/20/2019)  Tobacco Use: Low Risk  (12/31/2021)    CCM Care Plan  Allergies  Allergen Reactions   Codeine Other (See Comments)    REACTION: chest pain   Pioglitazone Other (See Comments)    REACTION to Actos: swelling in ankles    Medications Reviewed Today     Reviewed by Ria Bush, MD (Physician) on 12/31/21 at 1032  Med List Status: <None>   Medication Order Taking? Sig Documenting Provider Last Dose Status Informant  albuterol (VENTOLIN HFA) 108 (90 Base) MCG/ACT inhaler [id number] Yes Inhale 2 puffs into the lungs every 6 (six) hours as needed for wheezing or shortness of breath. Ria Bush, MD Taking Active Self  allopurinol (ZYLOPRIM) 300 MG tablet [id number] Yes TAKE ONE TABLET BY MOUTH EVERY DAY Ria Bush, MD Taking Active   ALPRAZolam Duanne Moron) 1 MG tablet [id number] Yes TAKE 1 TABLET BY MOUTH AT BEDTIME IF NEEDED FOR  SLEEP. Ria Bush, MD Taking Active   aspirin 81 MG EC tablet [id number] Yes Take 81 mg by mouth daily. Swallow whole. [provider] Taking Active    Cholecalciferol (VITAMIN D3) 25 MCG (1000 UT) CAPS [id number] Yes Take 1 capsule (1,000 Units total) by mouth daily. Ria Bush, MD Taking Active Self  colchicine 0.6 MG tablet [id number] Yes TAKE 1 TABLET BY MOUTH DAILY AS NEEDED FOR GOUT GLARE. ON FIRST DAY OF GOUT FLARE,MAY TAKE 2 TABLETS AT ONCE AS DIRECTED Ria Bush, MD Taking Active   Continuous Blood Gluc Sensor (FREESTYLE LIBRE 2 SENSOR) [place] [id number] Yes 1 Device by Does not apply route every 14 (fourteen) days. Renato Shin, MD Taking Active   cyanocobalamin (,VITAMIN B-12,) 1000 MCG/ML injection [id number] Yes INJECT 1ML INTRAMUSCULARLY EVERY [place] Ria Bush, MD Taking Active   cyclobenzaprine (FLEXERIL) 10 MG tablet [id number] Yes Take 10 mg by mouth 3 (three) times daily as needed for muscle spasms. [provider] Taking Active Self  doxazosin (CARDURA) 1 MG tablet [id number] Yes Take 1 tablet (1 mg total) by mouth 2 (two) times daily. Minna Merritts, MD Taking Active   fluticasone [facility]) 50 MCG/ACT nasal spray [id number] Yes Place 2 sprays into both nostrils daily. Elby Beck, FNP Taking Active Self  furosemide (LASIX) 80 MG tablet [id number] Yes TAKE ONE TABLET BY MOUTH EVERY DAY *MAY TAKE AN EXTRA PILL FOR SWELLING* Gollan, Kathlene November, MD Taking Active   gabapentin (NEURONTIN) 600 MG tablet [id number] Yes TAKE TWO TABLETS AT BEDTIME Ria Bush, MD Taking Active   glucose blood (GE100 BLOOD GLUCOSE TEST) test strip [id number] Yes TEST FOUR TIMES DAILY Shamleffer, Melanie Crazier, MD Taking Active   HYDROcodone-acetaminophen [facility]) 10-325 MG tablet [id number] Yes Take 1 tablet by mouth every 6 (six) hours as needed for moderate pain ((typically once to twice daily if needed)).  Ria Bush, MD Taking Active Self  insulin NPH Human (NOVOLIN N) 100 UNIT/ML injection [id number] Yes Inject 2.2 mLs (220 Units total) into the skin at bedtime. Renato Shin, MD Taking Active   isosorbide  mononitrate (IMDUR) 30 MG 24 hr tablet [id number] Yes TAKE ONE TABLET BY MOUTH DAILY Gollan, Kathlene November, MD Taking Active   losartan (COZAAR) 25 MG tablet [id number] Yes Take 1 tablet (25 mg total) by mouth at bedtime. Minna Merritts, MD Taking Active   lovastatin (MEVACOR) 40 MG tablet [id number] Yes TAKE TWO TABLETS BY MOUTH AT BEDTIME Minna Merritts, MD Taking Active   meclizine (ANTIVERT) 25 MG tablet [id number] Yes Take 25 mg by mouth 3 (three) times daily as needed for dizziness. [provider] Taking Active   Naphazoline-Pheniramine (OPCON-A OP) [id number] Yes Place 1 drop into both eyes 3 (three) times daily as needed (itchy eyes.).  [provider] Taking Active Self  nitroGLYCERIN (NITROSTAT) 0.4 MG SL tablet [id number] Yes Place 1 tablet (0.4 mg total) under the tongue every 5 (five) minutes as needed for chest pain. Minna Merritts, MD Taking Active   NOVOLIN R 100 UNIT/ML injection [id number] Yes INJECT 200 UNITS IN THE MORNING, 140 UNITS IN THE AFTERNOON, AND 280 UNITS IN THE EVENING - USE BEFORE A MEAL Shamleffer, Melanie Crazier, MD Taking Active   ondansetron (ZOFRAN) 4 MG tablet [id number] Yes Take 1 tablet (4 mg total) by mouth every 4 (four) hours as needed for nausea. Waunita Schooner, MD Taking Active   potassium citrate (UROCIT-K) 10 MEQ (1080 MG) SR tablet [id number] Yes Take 10 mEq by mouth 3 (  three) times a week. Ria Bush, MD Taking Active   Potassium Citrate 15 MEQ (1620 MG) TBCR [id number] Yes Take 1 tablet by mouth 3 (three) times daily. [provider] Taking Active   Sennosides-Docusate Sodium (STOOL SOFTENER & LAXATIVE PO) [id number] Yes Take 2 tablets by mouth at bedtime. [provider] Taking Active Self  Syringe/Needle, Disp, (SYRINGE 3CC/22GX1-1/2") 22G X 1-1/2" 3 ML MISC [id number] Yes Use to administer monthly b12 shots Ria Bush, MD Taking Active Self  Med List Note Sharene Butters, CPhT 10/12/18 1538): cpap machine  at bedtime            Patient Active Problem List   Diagnosis Date Noted   COVID-19 virus infection 01/25/2021   Paroxysmal atrial fibrillation ([place]) 01/25/2021   Non-ST elevation (NSTEMI) myocardial infarction ([place])    Cardiomyopathy ([place])    Syncope and collapse 10/30/2020   Hearing loss of right ear due to cerumen impaction 03/18/2020   Tremor 03/18/2020   Cervical radiculopathy 05/19/2019   Heart failure with preserved ejection fraction ([place]) 10/28/2018   Pre-op evaluation 08/12/2018   Carotid stenosis, asymptomatic, bilateral 03/29/2018   Medicare annual wellness visit, subsequent 03/20/2018   Advanced care planning/counseling discussion 03/20/2018   Vitamin D deficiency 03/20/2018   Vitamin B12 deficiency 03/20/2018   Aortic stenosis 03/20/2018   Type 2 diabetes mellitus with diabetic chronic kidney disease ([place]) 12/19/2017   Constipation 12/19/2017   Chronic gout 12/19/2017   Carpal tunnel syndrome of left wrist 09/19/2017   PAD (peripheral artery disease) ([place]) 05/07/2017   Upper airway cough syndrome 08/21/2016   S/P parathyroidectomy ([place]) 04/18/2016   Type 2 diabetes mellitus with diabetic polyneuropathy ([place]) 03/12/2016   ECG abnormal 02/28/2016   Loss of hearing 05/30/2015   CKD stage 3 due to type 2 diabetes mellitus ([place]) 01/27/2014   Obesity, morbid, BMI 40.0-49.9 ([place]) 03/08/2013   Impotence of organic origin 03/11/2011   CAD (coronary artery disease) 11/19/2010   Recurrent nephrolithiasis 03/26/2010   Acute sinusitis 02/27/2010   HEMATURIA UNSPECIFIED 02/27/2010   Dyslipidemia associated with type 2 diabetes mellitus ([place]) 04/13/2009   ALLERGIC RHINITIS 04/13/2009   History of depression 03/14/2009   PROTEINURIA, MILD 09/16/2008   History of deep vein thrombosis (DVT) of lower extremity 12/03/2007   OSA (obstructive sleep apnea) 12/03/2007   History of pulmonary embolus (PE) 10/05/2007   DISC DISEASE, LUMBAR 10/05/2007   Chronic insomnia 08/21/2007    Pernicious anemia 11/20/2006   Iron deficiency anemia 07/26/2006   Essential hypertension 07/26/2006   Asthmatic bronchitis 07/26/2006   GERD 07/26/2006   COLONIC POLYPS, HX OF 07/26/2006    Immunization History  Administered Date(s) Administered   Fluad Quad(high Dose 65+) 11/19/2021   Influenza Split 10/05/2010, 10/15/2011   Influenza Whole 10/05/2007, 10/28/2008, 10/03/2009   Influenza, High Dose Seasonal PF 10/21/2017, 10/15/2019, 10/23/2020   Influenza,inj,Quad PF,6+ Mos 10/08/2012, 09/17/2013, 10/03/2014, 10/09/2015, 10/08/2018   Influenza,inj,quad, With Preservative 10/14/2016, 10/15/2018   Influenza-Unspecified 10/22/2016   PFIZER(Purple Top)SARS-COV-2 Vaccination 02/20/2019, 03/13/2019, 10/26/2019   Pneumococcal Conjugate-13 08/30/2014   Pneumococcal Polysaccharide-23 11/15/2002, 09/15/2007, 03/20/2018   Td 04/21/2008   Zoster, Live 10/18/2009, 06/24/2011    Conditions to be addressed/monitored:  Hypertension, Hyperlipidemia, Diabetes, Atrial Fibrillation, and Heart Failure  Care Plan : [place]  Updates made by Charlton Haws, [place] since 01/02/2022 12:00 AM     Problem: Hypertension, Hyperlipidemia, Diabetes, Atrial Fibrillation, and Heart Failure      Long-Range Goal: Disease Management   Start Date:  03/10/2020  Priority: High  Note:   Current Barriers:  Unable to maintain control of DM  Pharmacist Clinical Goal(s):  Patient will adhere to plan to optimize therapeutic regimen for DM as evidenced by report of adherence to recommended medication management changes through collaboration with PharmD and provider.   Interventions: 1:1 collaboration with Ria Bush, MD regarding development and update of comprehensive plan of care as evidenced by provider attestation and co-signature Inter-disciplinary care team collaboration (see longitudinal plan of care) Comprehensive medication review performed; medication list updated in electronic  medical record   Hypertension / Heart Failure (BP goal <140/90) -Controlled - per clinic readings -Current home readings: none available -Denies hypotensive/hypertensive symptoms; Denies any falls  -He has some ankle swelling at nighttime but usually resolves in the morning. Not checking weight daily. Encouraged this. Denies any wheezing/shortness of breath.  -Last ejection fraction: 45-50% (Date: 10/2020) -HF type: HFmrEF (mildly reduced EF 41-49%) -NYHA Class: I-II; AHA HF Stage: C (Heart disease and symptoms present) -Current treatment: Losartan 25 mg daily HS - Appropriate, Effective, Safe, Accessible Furosemide 80 mg daily + extra PRN -Appropriate, Effective, Safe, Accessible Doxazosin 1 mg BID -Appropriate, Effective, Safe, Accessible Isosorbide MN 30 mg daily -Appropriate, Effective, Safe, Accessible -Medications previously tried: amlodipine - stopped per Cardio 04/2020 -Counseled to monitor BP at home weekly, document, and provide log at future appointments -Recommended to continue current medication   Atrial Fibrillation (Goal: prevent stroke and major bleeding) -Not ideally controlled - off anticoagulation due to cost issues -CHADSVASC: 7 -Pt follows with cardiology (Dr Rockey Situ), pt deferred anticoagulation at most recent appt 12/2021 -Current treatment: Aspirin 81 mg daily - Query appropriate -Medications previously tried: Eliquis (cost) -Consider warfarin or Xarelto With Me program in future  Diabetes (A1c goal <7%) -Not ideally controlled - A1c 7.9 (12/2021);  insulin TTD 850 units -Has steroid injection every 3-4 months. 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 -Recommended to continue current medication   Hyperlipidemia / CAD (LDL goal < 70) -Controlled- LDL 48 (06/2021) -Hx NSTEMI 01/2021; hx PAD -Current treatment: Lovastatin 40 mg HS Nitroglycerin 0.4 mg SL PRN Isosorbide MN 30 mg daily -Medications previously tried: n/a  -Educated on Cholesterol goals;  -Recommended to continue current medication  Patient Goals/Self-Care Activities Patient will:  - take medications as prescribed as evidenced by patient report and record review focus on medication adherence by routine check glucose via CGM, document, and provide at future appointments      Medication Assistance:  -Did not qualify for [place] in 2022  Compliance/Adherence/Medication fill history: Care Gaps: UACR Eye exam (due 5/820) Foot exam (due 06/20/21)  Star-Rating Drugs: Losartan - PDC 98% Lovastatin - PDC 100%  Medication Access: Within the past 30 days, how often has patient missed a dose of medication? 0 Is a pillbox or other method used to improve adherence? Yes  Factors that may affect medication adherence? financial need Are meds synced by current pharmacy? No  Are meds delivered by current pharmacy? No  Does patient experience delays in  picking up medications due to transportation concerns? No   [facility] Services Reviewed: Is patient disadvantaged to use [facility]?: Yes  Current Rx insurance plan: [facility] Name and location of Current pharmacy:  [place], [place] [address] [place] [address] Phone: [phone] Fax: [phone]  [facility] services reviewed with patient today?: No  Patient requests to transfer care to [facility]?: No  Reason patient declined to change  pharmacies: Disadvantaged due to insurance/mail order   Care Plan and Follow Up Patient Decision:  Patient agrees to Care Plan and Follow-up.  Plan: Telephone follow up appointment with care management team member scheduled for:  1 month  Charlene Brooke, PharmD, [facility] Clinical Pharmacist [facility] [phone]

## 2022-01-02 NOTE — Assessment & Plan Note (Addendum)
Chronic, uncontrolled, with both high and low readings. He's checking sugars 3-4 times daily with fingerstick. Pending establishing with new endo 03/2022. Continues NPH and novolin R. Also continues nightly gabapentin.  Has been unable to use Freestyle Libre 2 - will have him return Wed for CGM teaching with our pharmacist Mendel Ryder. He would really benefit from CGM use.  I asked him to price out trulicity - sent to pharmacy. If started, discussed lowering NPH and novolin R dose.

## 2022-01-02 NOTE — Assessment & Plan Note (Addendum)
Continues regular CPAP use, followed by pulmonology, last seen 09/2021.

## 2022-01-02 NOTE — Patient Instructions (Addendum)
Today we placed the [facility]estyle Libre 2 continuous glucose monitor (CGM) on your arm. It will track your sugar for you with new readings every 1-5 minutes.  Important points to remember about your CGM: -Any time the sugar reading does not reflect how you feel (particularly if it is telling you a very low reading), double check with a finger stick -The sensor is measuring the sugar in your skin, and a finger stick measures the sugar in your blood, and these numbers will probably never be exactly the same. This is normal. -The system will prompt you to change the sensor after approximately 14 days. -The sensor has to be removed prior to CT scans or MRIs. Try not to apply a new sensor if you know you have a scan coming up. -If the sensor falls off early, it cannot be re-applied. A new sensor should be applied.  With any sensor malfunctions, call [facility]: [phone] For sensor refills, contact [facility].  Visit Information  Phone number for Pharmacist: [phone]   Goals Addressed   None     Patient Care Plan: CCM Pharmacy Care Plan     Problem Identified: Hypertension, Hyperlipidemia, Diabetes, Atrial Fibrillation, and Heart Failure      Long-Range Goal: Disease Management   Start Date: 03/10/2020  Priority: High  Note:   Current Barriers:  Unable to maintain control of DM  Pharmacist Clinical Goal(s):  Patient will adhere to plan to optimize therapeutic regimen for DM as evidenced by report of adherence to recommended medication management changes through collaboration with PharmD and provider.   Interventions: 1:1 collaboration with Ria Bush, MD regarding development and update of comprehensive plan of care as evidenced by provider attestation and co-signature Inter-disciplinary care team collaboration (see longitudinal plan of care) Comprehensive medication review performed; medication list updated in electronic medical record   Hypertension / Heart Failure (BP  goal <140/90) -Controlled - per clinic readings -Current home readings: none available -Denies hypotensive/hypertensive symptoms; Denies any falls  -He has some ankle swelling at nighttime but usually resolves in the morning. Not checking weight daily. Encouraged this. Denies any wheezing/shortness of breath.  -Last ejection fraction: 45-50% (Date: 10/2020) -HF type: HFmrEF (mildly reduced EF 41-49%) -NYHA Class: I-II; AHA HF Stage: C (Heart disease and symptoms present) -Current treatment: Losartan 25 mg daily HS - Appropriate, Effective, Safe, Accessible Furosemide 80 mg daily + extra PRN -Appropriate, Effective, Safe, Accessible Doxazosin 1 mg BID -Appropriate, Effective, Safe, Accessible Isosorbide MN 30 mg daily -Appropriate, Effective, Safe, Accessible -Medications previously tried: amlodipine - stopped per Cardio 04/2020 -Counseled to monitor BP at home weekly, document, and provide log at future appointments -Recommended to continue current medication   Atrial Fibrillation (Goal: prevent stroke and major bleeding) -Not ideally controlled - off anticoagulation due to cost issues -CHADSVASC: 7 -Pt follows with cardiology (Dr Rockey Situ), pt deferred anticoagulation at most recent appt 12/2021 -Current treatment: Aspirin 81 mg daily - Query appropriate -Medications previously tried: Eliquis (cost) -Consider warfarin or Xarelto With Me program in future  Diabetes (A1c goal <7%) -Not ideally controlled - A1c 7.9 (12/2021);  insulin TTD 850 units -Has steroid injection every 3-4 months. 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 -Recommended to continue current medication   Hyperlipidemia / CAD (LDL goal < 70) -Controlled- LDL 48 (06/2021) -Hx NSTEMI 01/2021; hx PAD -Current treatment: Lovastatin 40 mg HS Nitroglycerin 0.4 mg SL PRN Isosorbide MN 30 mg daily -Medications previously tried: n/a  -Educated on Cholesterol goals;  -Recommended to continue current medication  Patient Goals/Self-Care Activities Patient will:  - take medications as prescribed as evidenced by patient report and record review focus on medication adherence by routine check glucose via CGM, document, and provide at future appointments      Print copy of patient instructions, educational materials, and care plan provided in person.  Telephone follow up appointment with pharmacy team member scheduled for: [place], PharmD, [facility] Clinical Pharmacist [facility] [phone]

## 2022-01-02 NOTE — Assessment & Plan Note (Signed)
Update renal panel 

## 2022-01-02 NOTE — Assessment & Plan Note (Signed)
Remains off anticoagulation due to cost. Planning to reassess affordability in the new year.

## 2022-01-02 NOTE — Telephone Encounter (Signed)
Rx sent 12/31/21, #20/0 to [facility].

## 2022-01-03 LAB — MICROALBUMIN / CREATININE URINE RATIO
Creatinine,U: 23.2 mg/dL
Microalb Creat Ratio: 27.4 mg/g (ref 0.0–30.0)
Microalb, Ur: 6.3 mg/dL — ABNORMAL HIGH (ref 0.0–1.9)

## 2022-01-16 ENCOUNTER — Ambulatory Visit: Payer: Medicare Other | Admitting: Internal Medicine

## 2022-01-22 ENCOUNTER — Other Ambulatory Visit: Payer: Self-pay | Admitting: Cardiovascular Disease

## 2022-02-01 ENCOUNTER — Telehealth: Payer: Self-pay

## 2022-02-01 NOTE — Progress Notes (Signed)
Care Management & Coordination Services Pharmacy Team  Reason for Encounter: Appointment Reminder  Contacted patient on 02/01/2022  Medications: Outpatient Encounter Medications as of 02/01/2022  Medication Sig   albuterol (VENTOLIN HFA) 108 (90 Base) MCG/ACT inhaler Inhale 2 puffs into the lungs every 6 (six) hours as needed for wheezing or shortness of breath.   allopurinol (ZYLOPRIM) 300 MG tablet TAKE ONE TABLET BY MOUTH EVERY DAY   ALPRAZolam (XANAX) 1 MG tablet TAKE 1 TABLET BY MOUTH AT BEDTIME IF NEEDED FOR SLEEP.   aspirin 81 MG EC tablet Take 81 mg by mouth daily. Swallow whole.   Cholecalciferol (VITAMIN D3) 25 MCG (1000 UT) CAPS Take 1 capsule (1,000 Units total) by mouth daily.   colchicine 0.6 MG tablet TAKE 1 TABLET BY MOUTH DAILY AS NEEDED FOR GOUT GLARE. ON FIRST DAY OF GOUT FLARE,MAY TAKE 2 TABLETS AT ONCE AS DIRECTED   Continuous Blood Gluc Sensor ([facility]STYLE LIBRE 2 SENSOR) MISC 1 Device by Does not apply route every 14 (fourteen) days.   cyanocobalamin (,VITAMIN B-12,) 1000 MCG/ML injection INJECT 1ML INTRAMUSCULARLY EVERY 30 DAYS   cyclobenzaprine (FLEXERIL) 10 MG tablet Take 10 mg by mouth 3 (three) times daily as needed for muscle spasms.   doxazosin (CARDURA) 1 MG tablet TAKE ONE TABLET BY MOUTH TWICE DAILY   fluticasone (FLONASE) 50 MCG/ACT nasal spray Place 2 sprays into both nostrils daily.   furosemide (LASIX) 80 MG tablet TAKE ONE TABLET BY MOUTH EVERY DAY *MAY TAKE AN EXTRA PILL FOR SWELLING*   gabapentin (NEURONTIN) 600 MG tablet TAKE TWO TABLETS AT BEDTIME   glucose blood (GE100 BLOOD GLUCOSE TEST) test strip TEST FOUR TIMES DAILY   HYDROcodone-acetaminophen (NORCO) 10-325 MG tablet Take 1 tablet by mouth every 6 (six) hours as needed for moderate pain ((typically once to twice daily if needed)).    insulin NPH Human (NOVOLIN N) 100 UNIT/ML injection Inject 2.2 mLs (220 Units total) into the skin at bedtime.   isosorbide mononitrate (IMDUR) 30 MG 24 hr tablet  TAKE ONE TABLET BY MOUTH DAILY   losartan (COZAAR) 25 MG tablet TAKE ONE TABLET BY MOUTH AT BEDTIME   lovastatin (MEVACOR) 40 MG tablet TAKE TWO TABLETS BY MOUTH AT BEDTIME   meclizine (ANTIVERT) 25 MG tablet Take 25 mg by mouth 3 (three) times daily as needed for dizziness.   Naphazoline-Pheniramine (OPCON-A OP) Place 1 drop into both eyes 3 (three) times daily as needed (itchy eyes.).    nitroGLYCERIN (NITROSTAT) 0.4 MG SL tablet Place 1 tablet (0.4 mg total) under the tongue every 5 (five) minutes as needed for chest pain.   NOVOLIN R 100 UNIT/ML injection INJECT 200 UNITS IN THE MORNING, 140 UNITS IN THE AFTERNOON AND 280 UNITS IN THE EVENING (USE BEFORE A MEAL)   ondansetron (ZOFRAN) 4 MG tablet Take 1 tablet (4 mg total) by mouth 3 (three) times daily as needed for nausea.   Potassium Citrate 15 MEQ (1620 MG) TBCR Take 1 tablet by mouth 3 (three) times daily.   Sennosides-Docusate Sodium (STOOL SOFTENER & LAXATIVE PO) Take 2 tablets by mouth at bedtime.   Syringe/Needle, Disp, (SYRINGE 3CC/22GX1-1/2") 22G X 1-1/2" 3 ML MISC Use to administer monthly b12 shots   No facility-administered encounter medications on file as of 02/01/2022.   Lab Results  Component Value Date/Time   HGBA1C 7.9 (A) 12/31/2021 10:42 AM   HGBA1C 7.5 (H) 06/20/2021 09:07 AM   HGBA1C 7.3 (A) 05/02/2021 11:34 AM   HGBA1C 7.0 (H) 10/30/2020 02:13  PM   MICROALBUR 6.3 (H) 01/02/2022 03:13 PM   MICROALBUR 12.0 (H) 03/02/2015 11:42 AM    BP Readings from Last 3 Encounters:  12/31/21 130/64  12/17/21 (!) 140/60  10/10/21 (!) 140/70    Unsuccessful attempt to reach patient. Left patient message reminding patient of appointment.  Patient contacted to confirm telephone appointment with Charlene Brooke,   PharmD, on 02/06/22 at 4:15.   Star Rating Drugs:  Medication:  Last Fill: [employer] Novolin N  12/20/21 30 Novolin R  01/03/22 30 Losartan  01/23/22 90 Lovastatin '40mg'$  12/26/21 90   Care Gaps: Annual  wellness visit in last year? Yes  If Diabetic: Last eye exam / retinopathy screening:2019 Last diabetic foot exam:2022   Charlene Brooke, PharmD notified  Avel Sensor, [place] Assistant [phone]

## 2022-02-04 DIAGNOSIS — G894 Chronic pain syndrome: Secondary | ICD-10-CM | POA: Diagnosis not present

## 2022-02-04 DIAGNOSIS — M5416 Radiculopathy, lumbar region: Secondary | ICD-10-CM | POA: Diagnosis not present

## 2022-02-04 DIAGNOSIS — M5136 Other intervertebral disc degeneration, lumbar region: Secondary | ICD-10-CM | POA: Diagnosis not present

## 2022-02-04 DIAGNOSIS — M5412 Radiculopathy, cervical region: Secondary | ICD-10-CM | POA: Diagnosis not present

## 2022-02-05 ENCOUNTER — Telehealth: Payer: Medicare Other

## 2022-02-06 ENCOUNTER — Ambulatory Visit: Payer: Medicare Other | Admitting: Pharmacist

## 2022-02-06 ENCOUNTER — Encounter: Payer: Medicare Other | Admitting: Pharmacist

## 2022-02-06 NOTE — Progress Notes (Signed)
Care Management & Coordination Services Pharmacy Note  02/06/2022 Name:  Donald [name] MRN:  [id number] DOB:  [birth year]  Summary: -DM: A1c 7.9% (12/2021); pt is taking 850 units of insulin per day; wearing CGM - He is having occasional mild hypoglycemia around 3am, 9am, 7pm and glucose peaks around 250 following supper and lunch.   -Reviewed AGP report: 01/24/22 to 02/06/22. Sensor active: 72%             Time in range (70-180): 59% (goal > 70%)             High (180-250): 28%             Very high (>250): 8%             Low (< 70): 5% (goal < 4%)             GMI: 7.2%; Average glucose: 161  Recommendations/Changes made from today's visit: -Recommend Ozempic 0.25 mg weekly x 4 weeks then increase to 0.5 mg weekly - complete PAP paperwork. -If starting Ozempic, reduce insulin: NPH 200 units HS and meal-time Novolin R reduce by 50 units for each dose. -Routing to PCP for input  Follow up plan: -Health Concierge will coordinate PAP -Pharmacist follow up televisit scheduled for 1 month -PCP appt 04/01/22; Endocrine appt 04/02/22    Subjective: Donald [name] is an 77 y.o. year old male who is a primary patient of Ria Bush, MD.  The care coordination team was consulted for assistance with disease management and care coordination needs.    Engaged with patient by telephone for follow up visit.  Recent office visits: 12/31/21 Dr Danise Mina OV: f/u - A1c 7.9%. GFR down 34. Request urine sample. DM - establishing new endo 03/2022. Asked to price out Trulicity - drop Novolin R by 50 units each dose and NPH to 200 units daily.  Recent consult visits: 12/17/21 Dr Rockey Situ (Cardiology): f/u - pt off Eliquis d/t cost. Taking aspirin. Rx'd NTG.   10/10/21 Dr Halford Chessman (Pulmonary): OSA - renewed CPAP   05/14/21 Dr Rockey Situ (Cardiology): f/u - Increased lovastatin to 80 mg.   05/02/21 Dr Loanne Drilling (Endocrine): f/u DM - A1c 7.3%. Reduce NPH to 220 u HS. F/U 3 months.  Hospital visits: None in  previous 6 months   Objective:  Lab Results  Component Value Date   CREATININE 1.88 (H) 12/31/2021   BUN 42 (H) 12/31/2021   GFR 34.88 (L) 12/31/2021   GFRNONAA 46 (L) 11/02/2020   GFRAA 52 (L) 10/19/2018   NA 143 12/31/2021   K 4.4 12/31/2021   CALCIUM 9.3 12/31/2021   CO2 31 12/31/2021   GLUCOSE 87 12/31/2021    Lab Results  Component Value Date/Time   HGBA1C 7.9 (A) 12/31/2021 10:42 AM   HGBA1C 7.5 (H) 06/20/2021 09:07 AM   HGBA1C 7.3 (A) 05/02/2021 11:34 AM   HGBA1C 7.0 (H) 10/30/2020 02:13 PM   FRUCTOSAMINE 319 (H) 10/21/2017 04:54 PM   FRUCTOSAMINE 296 (H) 03/11/2011 10:48 AM   GFR 34.88 (L) 12/31/2021 11:03 AM   GFR 47.82 (L) 06/20/2021 09:07 AM   MICROALBUR 6.3 (H) 01/02/2022 03:13 PM   MICROALBUR 12.0 (H) 03/02/2015 11:42 AM    Last diabetic Eye exam:  Lab Results  Component Value Date/Time   HMDIABEYEEXA Retinopathy (A) 05/21/2017 03:54 PM    Last diabetic Foot exam: No results found for: "HMDIABFOOTEX"   Lab Results  Component Value Date   CHOL 105 06/20/2021   HDL 36.30 (L) 06/20/2021  LDLCALC 48 06/20/2021   LDLDIRECT 52.2 05/25/2009   TRIG 104.0 06/20/2021   CHOLHDL 3 06/20/2021       Latest Ref Rng & Units 12/31/2021   11:03 AM 06/20/2021    9:07 AM 11/15/2020   11:57 AM  Hepatic Function  Total Protein 6.0 - 8.3 g/dL  6.1    Albumin 3.5 - 5.2 g/dL 4.3  4.1  3.9   AST 0 - 37 U/L  14    ALT 0 - 53 U/L  14    Alk Phosphatase 39 - 117 U/L  91    Total Bilirubin 0.2 - 1.2 mg/dL  1.3      Lab Results  Component Value Date/Time   TSH 2.156 10/31/2020 01:55 PM   TSH 2.448 10/30/2020 02:13 PM   TSH 3.01 03/09/2020 02:12 PM   TSH 3.14 10/21/2017 04:54 PM   FREET4 1.04 10/30/2020 02:13 PM       Latest Ref Rng & Units 12/31/2021   11:03 AM 11/02/2020    2:51 AM 11/01/2020    7:02 AM  CBC  WBC 4.0 - 10.5 K/uL 9.7  7.5  6.2   Hemoglobin 13.0 - 17.0 g/dL 13.5  13.8  13.6   Hematocrit 39.0 - 52.0 % 40.5  41.9  43.3   Platelets 150.0 -  400.0 K/uL 190.0  195  177     Lab Results  Component Value Date/Time   VD25OH 55.84 06/20/2021 09:07 AM   VD25OH 32.87 06/19/2020 08:24 AM   VITAMINB12 560 06/20/2021 09:07 AM   VITAMINB12 >1550 (H) 06/19/2020 08:24 AM    Clinical ASCVD: Yes  The ASCVD Risk score (Arnett DK, et al., 2019) failed to calculate for the following reasons:   The patient has a prior MI or stroke diagnosis    CHA2DS2/VAS Stroke Risk Points  Current as of 2 days ago (Monday)     7 >= 2 Points: High Risk  1 - 1.99 Points: Medium Risk  0 Points: Low Risk       06/26/2021   12:52 PM 06/21/2020    2:46 PM 04/20/2019    2:06 PM  Depression screen PHQ 2/9  Decreased Interest 3 0 0  Down, Depressed, Hopeless 0 0 0  PHQ - 2 Score 3 0 0  Altered sleeping 0 0 0  Tired, decreased energy 1 0 0  Change in appetite 0 0 0  Feeling bad or failure about yourself  0 0 0  Trouble concentrating 0 0 0  Moving slowly or fidgety/restless 0 0 0  Suicidal thoughts 0 0 0  PHQ-9 Score 4 0 0  Difficult doing work/chores Not difficult at all  Not difficult at all     Social History   Tobacco Use  Smoking Status Never  Smokeless Tobacco Never   BP Readings from Last 3 Encounters:  12/31/21 130/64  12/17/21 (!) 140/60  10/10/21 (!) 140/70   Pulse Readings from Last 3 Encounters:  12/31/21 (!) 55  12/17/21 62  10/10/21 61   Wt Readings from Last 3 Encounters:  12/31/21 255 lb 3.2 oz (115.8 kg)  12/17/21 259 lb 6 oz (117.7 kg)  10/10/21 258 lb (117 kg)   BMI Readings from Last 3 Encounters:  12/31/21 41.19 kg/m  12/17/21 41.86 kg/m  10/10/21 42.93 kg/m    Allergies  Allergen Reactions   Codeine Other (See Comments)    REACTION: chest pain   Pioglitazone Other (See Comments)    REACTION to Actos:  swelling in ankles    Medications Reviewed Today     Reviewed by Charlton Haws, [facility] (Pharmacist) on 02/06/22 at [place] List Status: <None>   Medication Order Taking? Sig Documenting Provider  Last Dose Status Informant  albuterol (VENTOLIN HFA) 108 (90 Base) MCG/ACT inhaler [id number] Yes Inhale 2 puffs into the lungs every 6 (six) hours as needed for wheezing or shortness of breath. Ria Bush, MD Taking Active Self  allopurinol (ZYLOPRIM) 300 MG tablet [id number] Yes TAKE ONE TABLET BY MOUTH EVERY DAY Ria Bush, MD Taking Active   ALPRAZolam Duanne Moron) 1 MG tablet [id number] Yes TAKE 1 TABLET BY MOUTH AT BEDTIME IF NEEDED FOR SLEEP. Ria Bush, MD Taking Active   aspirin 81 MG EC tablet [id number] Yes Take 81 mg by mouth daily. Swallow whole. [provider] Taking Active   Cholecalciferol (VITAMIN D3) 25 MCG (1000 UT) CAPS [id number] Yes Take 1 capsule (1,000 Units total) by mouth daily. Ria Bush, MD Taking Active Self  colchicine 0.6 MG tablet [id number] Yes TAKE 1 TABLET BY MOUTH DAILY AS NEEDED FOR GOUT GLARE. ON FIRST DAY OF GOUT FLARE,MAY TAKE 2 TABLETS AT ONCE AS DIRECTED Ria Bush, MD Taking Active   Continuous Blood Gluc Sensor (FREESTYLE LIBRE 2 SENSOR) [place] [id number] Yes 1 Device by Does not apply route every 14 (fourteen) days. Renato Shin, MD Taking Active   cyanocobalamin (,VITAMIN B-12,) 1000 MCG/ML injection [id number] Yes INJECT 1ML INTRAMUSCULARLY EVERY Joshua Ria Bush, MD Taking Active   cyclobenzaprine (FLEXERIL) 10 MG tablet [id number] Yes Take 10 mg by mouth 3 (three) times daily as needed for muscle spasms. [provider] Taking Active Self  doxazosin (CARDURA) 1 MG tablet [id number] Yes TAKE ONE TABLET BY MOUTH TWICE DAILY Gollan, Kathlene November, MD Taking Active   fluticasone (FLONASE) 50 MCG/ACT nasal spray [id number] Yes Place 2 sprays into both nostrils daily. Elby Beck, FNP Taking Active Self  furosemide (LASIX) 80 MG tablet [id number] Yes TAKE ONE TABLET BY MOUTH EVERY DAY *MAY TAKE AN EXTRA PILL FOR SWELLING* Gollan, Kathlene November, MD Taking Active   gabapentin (NEURONTIN) 600 MG tablet [id number] Yes  TAKE TWO TABLETS AT BEDTIME Ria Bush, MD Taking Active   glucose blood (GE100 BLOOD GLUCOSE TEST) test strip [id number] Yes TEST FOUR TIMES DAILY Shamleffer, Melanie Crazier, MD Taking Active   HYDROcodone-acetaminophen [facility]) 10-325 MG tablet [id number] Yes Take 1 tablet by mouth every 6 (six) hours as needed for moderate pain ((typically once to twice daily if needed)).  Ria Bush, MD Taking Active Self  insulin NPH Human (NOVOLIN N) 100 UNIT/ML injection [id number] Yes Inject 2.2 mLs (220 Units total) into the skin at bedtime. Renato Shin, MD Taking Active   isosorbide mononitrate (IMDUR) 30 MG 24 hr tablet [id number] Yes TAKE ONE TABLET BY MOUTH DAILY Rockey Situ, Kathlene November, MD Taking Active   losartan (COZAAR) 25 MG tablet [id number] Yes TAKE ONE TABLET BY MOUTH AT BEDTIME Minna Merritts, MD Taking Active   lovastatin (MEVACOR) 40 MG tablet [id number] Yes TAKE TWO TABLETS BY MOUTH AT BEDTIME Minna Merritts, MD Taking Active   meclizine (ANTIVERT) 25 MG tablet [id number] Yes Take 25 mg by mouth 3 (three) times daily as needed for dizziness. [provider] Taking Active   Naphazoline-Pheniramine (OPCON-A OP) [id number] Yes Place 1 drop into both eyes 3 (three) times daily as needed (itchy eyes.).  [provider] Taking Active Self  nitroGLYCERIN (NITROSTAT) 0.4 MG SL tablet [id number] Yes Place 1 tablet (  0.4 mg total) under the tongue every 5 (five) minutes as needed for chest pain. Minna Merritts, MD Taking Active   NOVOLIN R 100 UNIT/ML injection [id number] Yes INJECT 200 UNITS IN THE MORNING, 140 UNITS IN THE AFTERNOON AND 280 UNITS IN THE EVENING (USE BEFORE A MEAL) Shamleffer, Melanie Crazier, MD Taking Active   ondansetron (ZOFRAN) 4 MG tablet [id number] Yes Take 1 tablet (4 mg total) by mouth 3 (three) times daily as needed for nausea. Ria Bush, MD Taking Active   Potassium Citrate 15 MEQ (1620 MG) TBCR [id number] Yes Take 1 tablet by mouth 3 (three)  times daily. [provider] Taking Active   Sennosides-Docusate Sodium (STOOL SOFTENER & LAXATIVE PO) [id number] Yes Take 2 tablets by mouth at bedtime. [provider] Taking Active Self  Syringe/Needle, Disp, (SYRINGE 3CC/22GX1-1/2") 22G X 1-1/2" 3 ML MISC [id number] Yes Use to administer monthly b12 shots Ria Bush, MD Taking Active Self  Med List Note Marylynn Pearson 10/12/18 1538): cpap machine at bedtime            SDOH:  (Social Determinants of Health) assessments and interventions performed: Yes SDOH Interventions    [place] Coordination from 02/06/2022 in [place] Coordination from 11/27/2021 in [place] Management from 07/31/2020 in [place] at [place] Management from 03/10/2020 in [place] at [place] from 04/20/2019 in [place] at [place] Interventions -- Intervention Not Indicated -- -- --  Housing Interventions -- Intervention Not Indicated -- -- --  Transportation Interventions -- Intervention Not Indicated -- -- --  Depression Interventions/Treatment  -- -- -- -- PHQ2-9 Score <4 Follow-up Not Indicated  Financial Strain Interventions Other (Comment)  [Brand name meds unaffordable - can pursue PAP] -- Other (Comment)  [Restart PAP for insulins] --  [Apply for insulin assistance] --       Medication Assistance: None required.  Patient affirms current coverage meets needs.  Medication Access: Within the past 30 days, how often has patient missed a dose of medication? 0 Is a pillbox or other method used to improve adherence? Yes  Factors that may affect medication adherence? financial need Are meds synced by current pharmacy? No  Are meds delivered by current pharmacy? No  Does patient experience delays in  picking up medications due to transportation concerns? No   Upstream Services Reviewed: Is patient disadvantaged to use [facility] Pharmacy?: Yes  Current Rx insurance plan: [facility] PDP Name and location of Current pharmacy:  [facility], [place] - [place] [address] Phone: [phone] Fax: [phone]  [place] [address] [place] - [place] [address] Phone: [phone] Fax: [phone]  [facility], [place] [address] [place] [address] Phone: [phone] Fax: [phone]  [facility] Pharmacy services reviewed with patient today?: No  Patient requests to transfer care to [facility] Pharmacy?: No  Reason patient declined to change pharmacies: Disadvantaged due to insurance/mail order  Compliance/Adherence/Medication fill history: Care Gaps: Foot exam (due 06/2021) Eye exam (due 05/2018)  Star-Rating Drugs: Losartan - PDC 94% Lovastatin - PDC 100%   Assessment/Plan    Hypertension / Heart Failure (BP goal <140/90) -Controlled - per clinic readings -Current  home readings: none available -Denies hypotensive/hypertensive symptoms; Denies any falls  -He has some ankle swelling at nighttime but usually resolves in the morning. Not checking weight daily. Encouraged this. Denies any wheezing/shortness of breath.  -Last ejection fraction: 45-50% (Date: 10/2020) -HF type: HFmrEF (mildly reduced EF 41-49%) -NYHA Class: I-II; AHA HF Stage: C (Heart disease and symptoms present) -Current treatment: Losartan 25 mg daily HS - Appropriate, Effective, Safe, Accessible Furosemide 80 mg daily + extra PRN -Appropriate, Effective, Safe, Accessible Doxazosin 1 mg BID -Appropriate, Effective, Safe, Accessible Isosorbide MN 30 mg daily -Appropriate, Effective, Safe, Accessible -Medications previously tried: amlodipine - stopped per Cardio  04/2020 -Counseled to monitor BP at home weekly, document, and provide log at future appointments -Recommended to continue current medication   Atrial Fibrillation (Goal: prevent stroke and major bleeding) -Not ideally controlled - off anticoagulation due to cost issues -CHADSVASC: 7 -Pt follows with cardiology (Dr Rockey Situ), pt deferred anticoagulation at most recent appt 12/2021 -Current treatment: Aspirin 81 mg daily - Query appropriate -Medications previously tried: Eliquis (cost) -Consider warfarin or Xarelto With Me program in future  Diabetes (A1c goal <7%) -Not ideally controlled - A1c 7.9% (12/2021);  insulin TTD 850 units -Has steroid injection every 3-4 months. -Re-sstablishing with endocrine 03/2022 (previously saw Dr Loanne Drilling) -Completed CGM training for [facility] 2 01/02/22. -Reviewed AGP report: 01/24/22 to 02/06/22. Sensor active: 72%  Time in range (70-180): 59% (goal > 70%)  High (180-250): 28%  Very high (>250): 8%  Low (< 70): 5% (goal < 4%)  GMI: 7.2%; Average glucose: 161  -Managed by endocrinology -Current medications: Novolin N - 220 units HS - Appropriate, Query Effective Novolin R - 200 units AM, 140 units lunch, 280 units dinner -Appropriate, Query Effective Freestyle Libre 2 ([place]) - Appropriate, Effective, Safe, Accessible -Medications previously tried: Engineer, agricultural (cost) -Reviewed CGM report; occasional low sugar occur 3am, 9am, 7pm. Pt may benefit from GLP-1 RA for insulin-sparing effect and wt loss, he previously could not afford Mounjaro but may qualify for Ozempic PAP; -Recommended to continue current medication; consider Ozempic PAP - consulting with PCP   Hyperlipidemia / CAD (LDL goal < 70) -Controlled- LDL 48 (06/2021) -Hx NSTEMI 01/2021; hx PAD -Current treatment: Lovastatin 40 mg HS - Appropriate, Effective, Safe, Accessible Nitroglycerin 0.4 mg SL PRN -Appropriate, Effective, Safe, Accessible Isosorbide MN 30 mg daily -Appropriate,  Effective, Safe, Accessible -Medications previously tried: n/a  -Educated on Cholesterol goals;  -Recommended to continue current medication   Charlene Brooke, PharmD, BCACP Clinical Pharmacist [facility] at [facility] [phone]

## 2022-02-07 ENCOUNTER — Telehealth: Payer: Self-pay | Admitting: Pharmacist

## 2022-02-07 NOTE — Patient Instructions (Signed)
Visit Information  Phone number for Pharmacist: [phone]  Thank you for meeting with me to discuss your medications! Below is a summary of what we talked about during the visit:   Summary: -DM: A1c 7.9% (12/2021); pt is taking 850 units of insulin per day; wearing CGM - He is having occasional mild hypoglycemia around 3am, 9am, 7pm and glucose peaks around 250 following supper and lunch.   -Reviewed AGP report: 01/24/22 to 02/06/22. Sensor active: 72%             Time in range (70-180): 59% (goal > 70%)             High (180-250): 28%             Very high (>250): 8%             Low (< 70): 5% (goal < 4%)             GMI: 7.2%; Average glucose: 161  Recommendations/Changes made from today's visit: -Recommend Ozempic 0.25 mg weekly x 4 weeks then increase to 0.5 mg weekly - complete PAP paperwork. -If starting Ozempic, reduce insulin: NPH 200 units HS and meal-time Novolin R reduce by 50 units for each dose. -Routing to PCP for input  Follow up plan: -Health Concierge will coordinate PAP -Pharmacist follow up televisit scheduled for 1 month -PCP appt 04/01/22; Endocrine appt 04/02/22    Charlene Brooke, PharmD, BCACP Clinical Pharmacist [facility] at [facility] [phone]

## 2022-02-07 NOTE — Telephone Encounter (Signed)
Discussed diabetes management with patient. Last A1c 7.9% (12/2021), taking ~850 units of insulin per day. He is wearing [employer], report below. He is having occasional mild hypoglycemia around 3am, 9am, 7pm and glucose peaks around 250 following supper and lunch.   Pt may benefit from GLP-1 RA for insulin-sparing effect and weight loss, he previously could not afford Mounjaro but may qualify for Ozempic PAP (currently Ozempic is the only GLP-1 RA that has patient assistance available). He did discuss trial of Trulicity at PCP appt 91/69/45 but was not able to price out at pharmacy. Of note he has a deductible so current copay will likely be unaffordable without PAP.  Current insulin regimen: Novolin N - 220 units HS  Novolin R - 200 units AM, 140 units lunch, 280 units dinner   -Recommend Ozempic 0.25 mg weekly x 4 weeks then increase to 0.5 mg weekly - complete PAP paperwork. -If starting Ozempic, reduce insulin: NPH 200 units HS and meal-time Novolin R reduce by 50 units for each dose. -Routing to PCP for input.   -Reviewed AGP report: 01/24/22 to 02/06/22. Sensor active: 72%  Time in range (70-180): 59% (goal > 70%)  High (180-250): 28%  Very high (>250): 8%  Low (< 70): 5% (goal < 4%)  GMI: 7.2%; Average glucose: 161

## 2022-02-08 NOTE — Telephone Encounter (Signed)
Agree with these recommendations - let me know what you need me to do. Thanks!

## 2022-02-13 NOTE — Telephone Encounter (Addendum)
Submitted Novo Cares PAP application via online portal for Ozempic 0.5 mg pens. Ordered SIG: 0.25 mg weekly x 4 weeks then increase to 0.5 mg weekly  It will take about a month for approval/shipment of Ozempic. Pt will continue insulin regimen as is until he receives Ozempic. Phone appt scheduled with PharmD 2/20 to go over changes with Ozempic.

## 2022-02-14 DIAGNOSIS — M5416 Radiculopathy, lumbar region: Secondary | ICD-10-CM | POA: Diagnosis not present

## 2022-02-18 NOTE — Telephone Encounter (Signed)
Received update from [employer] - they need a copy of patient's Medicare Part D card [facility]).

## 2022-02-19 NOTE — Progress Notes (Signed)
Contacted the patient and he will stop by the office at [facility] possibly today with the [facility] card so the office can make a copy . Novo assistance program is requesting a copy.   Avel Sensor, [place] Assistant [phone]

## 2022-02-26 ENCOUNTER — Other Ambulatory Visit: Payer: Self-pay | Admitting: Cardiovascular Disease

## 2022-02-27 ENCOUNTER — Telehealth: Payer: Self-pay | Admitting: Family Medicine

## 2022-02-27 NOTE — Telephone Encounter (Signed)
Faxed insurance card to [employer] for [employer].

## 2022-02-27 NOTE — Telephone Encounter (Signed)
Pt came in office to drop off copy of his insurance card for Pharmacist place in folder

## 2022-02-27 NOTE — Telephone Encounter (Signed)
Received [employer] card from patient. Faxed to [employer] to complete application.

## 2022-02-28 ENCOUNTER — Telehealth: Payer: Self-pay

## 2022-02-28 NOTE — Progress Notes (Signed)
Care Management & Coordination Services Pharmacy Team  Reason for Encounter: Appointment Reminder  Contacted patient to confirm telephone appointment with Charlene Brooke , PharmD on 03/05/22 at 3:45. Spoke with patient on 03/01/2022   Do you have any problems getting your medications? No   What is your top health concern you would like to discuss at your upcoming visit?  Patient continues to have chronic back pain with no relief so far from recent steroid injection.  Have you seen any other providers since your last visit with PCP? Yes  PM&R    Hospital visits:  09/06/21-Blake Hampton,MD([facility])-cataract extraction-no admission    Star Rating Drugs:  Medication:  Last Fill: Day Supply Novolin  02/01/22 30 Novolin R  02/26/22 30 Losartan 70m 01/23/22 90 Lovastatin 491m12/13/23 90   Care Gaps: Annual wellness visit in last year? Yes  If Diabetic: Last eye exam / retinopathy screening:2019 Last diabetic foot exam:2022   LiCharlene BrookePharmD notified  VeAvel SensorCCBelfryssistant [phone]

## 2022-02-28 NOTE — Progress Notes (Signed)
Care Management & Coordination Services Pharmacy Note  03/05/2022 Name:  Donald [name] MRN:  [id number] DOB:  [birth year]  Summary: F/U visit -DM: A1c 7.9% (12/2021); pt is taking ~570 units of insulin per day; wearing CGM; he has been approved for Ozempic PAP, shipment to office is still pending; reviewed Ozempic benefits, side effects, dosing, and administration - will reduce insulin when starting Ozempic to avoid hypoglycemia d/t insulin sparing effect Reviewed AGP report: 02/13/22 to 02/26/22. Sensor active: 84%  Time in range (70-180): 51% (goal > 70%)  High (180-250): 34%  Very high (>250): 13%  Low (< 70): 2% (goal < 4%)  GMI: 7.6%; Average glucose: 179  Recommendations/Changes made from today's visit: -When received, start Ozempic 0.25 mg weekly and Reduce insulin: Novolin N 160-180 units HS; Novolin R reduce by 50 units  Follow up plan: -Pharmacist follow up televisit scheduled for 6 weeks -PCP appt 04/01/22; Endocrine appt 04/02/22; cardiology appt 06/24/22    Subjective: Donald [name] is an 77 y.o. year old male who is a primary patient of Ria Bush, MD.  The care coordination team was consulted for assistance with disease management and care coordination needs.    Engaged with patient by telephone for follow up visit.  Recent office visits: 12/31/21 Dr Danise Mina OV: f/u - A1c 7.9%. GFR down 34. Request urine sample. DM - establishing new endo 03/2022. Asked to price out Trulicity - drop Novolin R by 50 units each dose and NPH to 200 units daily.  Recent consult visits: 12/17/21 Dr Rockey Situ (Cardiology): f/u - pt off Eliquis d/t cost. Taking aspirin. Rx'd NTG.   10/10/21 Dr Halford Chessman (Pulmonary): OSA - renewed CPAP   05/14/21 Dr Rockey Situ (Cardiology): f/u - Increased lovastatin to 80 mg.   05/02/21 Dr Loanne Drilling (Endocrine): f/u DM - A1c 7.3%. Reduce NPH to 220 u HS. F/U 3 months.  Hospital visits: None in previous 6 months   Objective:  Lab Results  Component  Value Date   CREATININE 1.88 (H) 12/31/2021   BUN 42 (H) 12/31/2021   GFR 34.88 (L) 12/31/2021   GFRNONAA 46 (L) 11/02/2020   GFRAA 52 (L) 10/19/2018   NA 143 12/31/2021   K 4.4 12/31/2021   CALCIUM 9.3 12/31/2021   CO2 31 12/31/2021   GLUCOSE 87 12/31/2021    Lab Results  Component Value Date/Time   HGBA1C 7.9 (A) 12/31/2021 10:42 AM   HGBA1C 7.5 (H) 06/20/2021 09:07 AM   HGBA1C 7.3 (A) 05/02/2021 11:34 AM   HGBA1C 7.0 (H) 10/30/2020 02:13 PM   FRUCTOSAMINE 319 (H) 10/21/2017 04:54 PM   FRUCTOSAMINE 296 (H) 03/11/2011 10:48 AM   GFR 34.88 (L) 12/31/2021 11:03 AM   GFR 47.82 (L) 06/20/2021 09:07 AM   MICROALBUR 6.3 (H) 01/02/2022 03:13 PM   MICROALBUR 12.0 (H) 03/02/2015 11:42 AM    Last diabetic Eye exam:  Lab Results  Component Value Date/Time   HMDIABEYEEXA Retinopathy (A) 05/21/2017 03:54 PM    Last diabetic Foot exam: No results found for: "HMDIABFOOTEX"   Lab Results  Component Value Date   CHOL 105 06/20/2021   HDL 36.30 (L) 06/20/2021   LDLCALC 48 06/20/2021   LDLDIRECT 52.2 05/25/2009   TRIG 104.0 06/20/2021   CHOLHDL 3 06/20/2021       Latest Ref Rng & Units 12/31/2021   11:03 AM 06/20/2021    9:07 AM 11/15/2020   11:57 AM  Hepatic Function  Total Protein 6.0 - 8.3 g/dL  6.1    Albumin 3.5 -  5.2 g/dL 4.3  4.1  3.9   AST 0 - 37 U/L  14    ALT 0 - 53 U/L  14    Alk Phosphatase 39 - 117 U/L  91    Total Bilirubin 0.2 - 1.2 mg/dL  1.3      Lab Results  Component Value Date/Time   TSH 2.156 10/31/2020 01:55 PM   TSH 2.448 10/30/2020 02:13 PM   TSH 3.01 03/09/2020 02:12 PM   TSH 3.14 10/21/2017 04:54 PM   FREET4 1.04 10/30/2020 02:13 PM       Latest Ref Rng & Units 12/31/2021   11:03 AM 11/02/2020    2:51 AM 11/01/2020    7:02 AM  CBC  WBC 4.0 - 10.5 K/uL 9.7  7.5  6.2   Hemoglobin 13.0 - 17.0 g/dL 13.5  13.8  13.6   Hematocrit 39.0 - 52.0 % 40.5  41.9  43.3   Platelets 150.0 - 400.0 K/uL 190.0  195  177     Lab Results  Component Value  Date/Time   VD25OH 55.84 06/20/2021 09:07 AM   VD25OH 32.87 06/19/2020 08:24 AM   VITAMINB12 560 06/20/2021 09:07 AM   VITAMINB12 >1550 (H) 06/19/2020 08:24 AM    Clinical ASCVD: Yes  The ASCVD Risk score (Arnett DK, et al., 2019) failed to calculate for the following reasons:   The patient has a prior MI or stroke diagnosis    CHA2DS2/VAS Stroke Risk Points  Current as of 2 days ago (Monday)     7 >= 2 Points: High Risk  1 - 1.99 Points: Medium Risk  0 Points: Low Risk       06/26/2021   12:52 PM 06/21/2020    2:46 PM 04/20/2019    2:06 PM  Depression screen PHQ 2/9  Decreased Interest 3 0 0  Down, Depressed, Hopeless 0 0 0  PHQ - 2 Score 3 0 0  Altered sleeping 0 0 0  Tired, decreased energy 1 0 0  Change in appetite 0 0 0  Feeling bad or failure about yourself  0 0 0  Trouble concentrating 0 0 0  Moving slowly or fidgety/restless 0 0 0  Suicidal thoughts 0 0 0  PHQ-9 Score 4 0 0  Difficult doing work/chores Not difficult at all  Not difficult at all     Social History   Tobacco Use  Smoking Status Never  Smokeless Tobacco Never   BP Readings from Last 3 Encounters:  12/31/21 130/64  12/17/21 (!) 140/60  10/10/21 (!) 140/70   Pulse Readings from Last 3 Encounters:  12/31/21 (!) 55  12/17/21 62  10/10/21 61   Wt Readings from Last 3 Encounters:  12/31/21 255 lb 3.2 oz (115.8 kg)  12/17/21 259 lb 6 oz (117.7 kg)  10/10/21 258 lb (117 kg)   BMI Readings from Last 3 Encounters:  12/31/21 41.19 kg/m  12/17/21 41.86 kg/m  10/10/21 42.93 kg/m    Allergies  Allergen Reactions   Codeine Other (See Comments)    REACTION: chest pain   Pioglitazone Other (See Comments)    REACTION to Actos: swelling in ankles    Medications Reviewed Today     Reviewed by Charlton Haws, [facility] (Pharmacist) on 03/05/22 at [place] List Status: <None>   Medication Order Taking? Sig Documenting Provider Last Dose Status Informant  albuterol (VENTOLIN HFA) 108 (90  Base) MCG/ACT inhaler [id number] Yes Inhale 2 puffs into the lungs every 6 (six) hours  as needed for wheezing or shortness of breath. Ria Bush, MD Taking Active Self  allopurinol (ZYLOPRIM) 300 MG tablet [id number] Yes TAKE ONE TABLET BY MOUTH EVERY DAY Ria Bush, MD Taking Active   ALPRAZolam Duanne Moron) 1 MG tablet [id number] Yes TAKE 1 TABLET BY MOUTH AT BEDTIME IF NEEDED FOR SLEEP. Ria Bush, MD Taking Active   aspirin 81 MG EC tablet [id number] Yes Take 81 mg by mouth daily. Swallow whole. [provider] Taking Active   Cholecalciferol (VITAMIN D3) 25 MCG (1000 UT) CAPS [id number] Yes Take 1 capsule (1,000 Units total) by mouth daily. Ria Bush, MD Taking Active Self  colchicine 0.6 MG tablet [id number] Yes TAKE 1 TABLET BY MOUTH DAILY AS NEEDED FOR GOUT GLARE. ON FIRST DAY OF GOUT FLARE,MAY TAKE 2 TABLETS AT ONCE AS DIRECTED Ria Bush, MD Taking Active   Continuous Blood Gluc Sensor (FREESTYLE LIBRE 2 SENSOR) [place] [id number] Yes 1 Device by Does not apply route every 14 (fourteen) days. Renato Shin, MD Taking Active   cyanocobalamin (,VITAMIN B-12,) 1000 MCG/ML injection [id number] Yes INJECT 1ML INTRAMUSCULARLY EVERY [place] Ria Bush, MD Taking Active   cyclobenzaprine (FLEXERIL) 10 MG tablet [id number] Yes Take 10 mg by mouth 3 (three) times daily as needed for muscle spasms. [provider] Taking Active Self  doxazosin (CARDURA) 1 MG tablet [id number] Yes TAKE ONE TABLET BY MOUTH TWICE DAILY Gollan, Kathlene November, MD Taking Active   fluticasone (FLONASE) 50 MCG/ACT nasal spray [id number] Yes Place 2 sprays into both nostrils daily. Elby Beck, FNP Taking Active Self  furosemide (LASIX) 80 MG tablet [id number] Yes TAKE ONE TABLET BY MOUTH EVERY DAY *MAY TAKE AN EXTRA PILL FOR SWELLING* Gollan, Kathlene November, MD Taking Active   gabapentin (NEURONTIN) 600 MG tablet [id number] Yes TAKE TWO TABLETS AT BEDTIME Ria Bush, MD Taking Active    glucose blood (GE100 BLOOD GLUCOSE TEST) test strip [id number] Yes TEST FOUR TIMES DAILY Shamleffer, Melanie Crazier, MD Taking Active   HYDROcodone-acetaminophen [facility]) 10-325 MG tablet [id number] Yes Take 1 tablet by mouth every 6 (six) hours as needed for moderate pain ((typically once to twice daily if needed)).  Ria Bush, MD Taking Active Self  insulin NPH Human (NOVOLIN N) 100 UNIT/ML injection [id number] Yes Inject 2 mLs (200 Units total) into the skin at bedtime. Shamleffer, Melanie Crazier, MD Taking Active            Med Note Charlton Haws   Tue Mar 05, 2022  4:10 PM) Takes 180-200 units  isosorbide mononitrate (IMDUR) 30 MG 24 hr tablet [id number] Yes TAKE ONE TABLET BY MOUTH DAILY Rockey Situ Kathlene November, MD Taking Active   losartan (COZAAR) 25 MG tablet [id number] Yes TAKE ONE TABLET BY MOUTH AT BEDTIME Minna Merritts, MD Taking Active   lovastatin (MEVACOR) 40 MG tablet [id number] Yes TAKE TWO TABLETS BY MOUTH AT BEDTIME Minna Merritts, MD Taking Active   meclizine (ANTIVERT) 25 MG tablet [id number] Yes Take 25 mg by mouth 3 (three) times daily as needed for dizziness. [provider] Taking Active   Naphazoline-Pheniramine (OPCON-A OP) [id number] Yes Place 1 drop into both eyes 3 (three) times daily as needed (itchy eyes.).  [provider] Taking Active Self  nitroGLYCERIN (NITROSTAT) 0.4 MG SL tablet [id number] Yes Place 1 tablet (0.4 mg total) under the tongue every 5 (five) minutes as needed for chest pain. Minna Merritts, MD Taking Active   NOVOLIN R 100 UNIT/ML injection [id number] Yes INJECT 200 UNITS IN THE  MORNING, 140 UNITS IN THE AFTERNOON AND 280 UNITS IN THE EVENING (USE BEFORE A MEAL) Shamleffer, Melanie Crazier, MD Taking Active            Med Note Charlton Haws   Tue Mar 05, 2022  4:10 PM) Takes 170 units before morning meal, 160-200 units before evening emal  ondansetron (ZOFRAN) 4 MG tablet [id number] Yes Take 1 tablet (4 mg total)  by mouth 3 (three) times daily as needed for nausea. Ria Bush, MD Taking Active   Potassium Citrate 15 MEQ (1620 MG) TBCR [id number] Yes Take 1 tablet by mouth 3 (three) times daily. [provider] Taking Active   Semaglutide,0.25 or 0.5MG/DOS, (OZEMPIC, 0.25 OR 0.5 MG/DOSE,) 2 MG/3ML SOPN [id number] No Inject 0.25 mg weekly x 4 weeks, then increase to 0.5 mg weekly  Patient not taking: Reported on 03/05/2022   [provider] Not Taking Active            Med Note Charlton Haws   Tue Mar 05, 2022  4:11 PM) Novo Cares PAP  Sennosides-Docusate Sodium (STOOL SOFTENER & LAXATIVE PO) [id number] Yes Take 2 tablets by mouth at bedtime. [provider] Taking Active Self  Syringe/Needle, Disp, (SYRINGE 3CC/22GX1-1/2") 22G X 1-1/2" 3 ML MISC [id number] Yes Use to administer monthly b12 shots Ria Bush, MD Taking Active Self  Med List Note Marylynn Pearson 10/12/18 1538): cpap machine at bedtime            SDOH:  (Social Determinants of Health) assessments and interventions performed: Yes SDOH Interventions    [place] Coordination from 02/06/2022 in [place] Coordination from 11/27/2021 in [place] Management from 07/31/2020 in [place] at [place] Management from 03/10/2020 in [place] at [place] from 04/20/2019 in [place] at [place] Interventions -- Intervention Not Indicated -- -- --  Housing Interventions -- Intervention Not Indicated -- -- --  Transportation Interventions -- Intervention Not Indicated -- -- --  Depression Interventions/Treatment  -- -- -- -- PHQ2-9 Score <4 Follow-up Not Indicated  Financial Strain Interventions Other (Comment)  [Brand name meds unaffordable - can pursue PAP] -- Other  (Comment)  [Restart PAP for insulins] --  [Apply for insulin assistance] --       Medication Assistance: None required.  Patient affirms current coverage meets needs.  Medication Access: Within the past 30 days, how often has patient missed a dose of medication? 0 Is a pillbox or other method used to improve adherence? Yes  Factors that may affect medication adherence? financial need Are meds synced by current pharmacy? No  Are meds delivered by current pharmacy? No  Does patient experience delays in picking up medications due to transportation concerns? No   [facility] Services Reviewed: Is patient disadvantaged to use [facility]?: Yes  Current Rx insurance plan: [facility] PDP Name and location of Current pharmacy:  [place], [place] - [place] [address] Phone: [phone] Fax: [phone]  [facility] [address], [place] - [place] [address] Phone: [phone] Fax: [phone]  [facility], [place] [address] [place] [address] Phone: [phone] Fax: [phone]  [facility] services reviewed  with patient today?: No  Patient requests to transfer care to [facility]?: No  Reason patient declined to change pharmacies: Disadvantaged due to insurance/mail order  Compliance/Adherence/Medication fill history: Care Gaps: Foot exam (due 06/2021) Eye exam (due 05/2018)  Star-Rating Drugs: Losartan - PDC 94% Lovastatin - PDC 100%   ASSESSMENT / PLAN   Hypertension / Heart Failure (BP goal <140/90) -Controlled - per clinic readings -Current home readings: none available -Denies hypotensive/hypertensive symptoms; Denies any falls  -He has some ankle swelling at nighttime but usually resolves in the morning. Not checking weight daily. Encouraged this. Denies any wheezing/shortness of breath.  -Last  ejection fraction: 45-50% (Date: 10/2020) -HF type: HFmrEF (mildly reduced EF 41-49%) -NYHA Class: I-II; AHA HF Stage: C (Heart disease and symptoms present) -Current treatment: Losartan 25 mg daily HS - Appropriate, Effective, Safe, Accessible Furosemide 80 mg daily + extra PRN -Appropriate, Effective, Safe, Accessible Doxazosin 1 mg BID -Appropriate, Effective, Safe, Accessible Isosorbide MN 30 mg daily -Appropriate, Effective, Safe, Accessible -Medications previously tried: amlodipine - stopped per Cardio 04/2020 -Counseled to monitor BP at home weekly, document, and provide log at future appointments -Recommended to continue current medication   Atrial Fibrillation (Goal: prevent stroke and major bleeding) -Not ideally controlled - off anticoagulation due to cost issues -CHADSVASC: 7 -Pt follows with cardiology (Dr Rockey Situ), pt deferred anticoagulation at most recent appt 12/2021 -Current treatment: Aspirin 81 mg daily - Query appropriate -Medications previously tried: Eliquis (cost) -Consider warfarin or Xarelto With Me program in future  Diabetes (A1c goal <7%) -Not ideally controlled - A1c 7.9% (12/2021);  insulin TTD 850 units -Has steroid injection every 3-4 months. -Re-establishing with endocrine 03/2022 (previously saw Dr Loanne Drilling) -Completed CGM training for [facility] 01/02/22. Reviewed AGP report: 02/13/22 to 02/26/22. Sensor active: 84%  Time in range (70-180): 51% (goal > 70%)  High (180-250): 34%  Very high (>250): 13%  Low (< 70): 2% (goal < 4%)  GMI: 7.6%; Average glucose: 179  -Previous AGP report: 01/24/22 to 02/06/22. Sensor active: 72%  Time in range (70-180): 59% (goal > 70%)  High (180-250): 28%  Very high (>250): 8%  Low (< 70): 5% (goal < 4%)  GMI: 7.2%; Average glucose: 161  -Managed by endocrinology -Current medications: Novolin N - 180-200 units HS - Appropriate, Query Effective Novolin R - 170u AM, 160-200 PM -Appropriate, Query  Effective Freestyle Libre 2 ([facility]) - Appropriate, Effective, Safe, Accessible -Medications previously tried: Engineer, agricultural (cost) -Pt has been approved for Ozempic PAP, shipment to office pending; discussed Ozempic benefits for wt loss and insulin-sparing effect - we will reduce insulin by ~10-15% once starting Ozempic; discussed possible side effect including nausea and how to mitigate nausea with smaller, low-carb meals; discussed use of Ozempic pen device -When received, start Ozempic 0.25 mg weekly and Reduce insulin: - Novolin N 160-180 units HS; Novolin R reduce by 50 units   Hyperlipidemia / CAD (LDL goal < 70) -Controlled- LDL 48 (06/2021) at goal -Hx NSTEMI 01/2021; hx PAD -Current treatment: Lovastatin 40 mg HS - Appropriate, Effective, Safe, Accessible Nitroglycerin 0.4 mg SL PRN -Appropriate, Effective, Safe, Accessible Isosorbide MN 30 mg daily -Appropriate, Effective, Safe, Accessible -Medications previously tried: n/a  -Educated on Cholesterol goals;  -Recommended to continue current medication  Back pain (Goal: manage pain) -Controlled -Follows with PM&R (Dr Nelva Bush) -Current treatment  Hydrocodone-APAP 7.5-325 mg #120/month - Appropriate, Effective, Safe, Accessible -Medications previously tried: n/a  -Recommended to continue current medication   Charlene Brooke, PharmD, [place]  Clinical Pharmacist [place] [facility] [phone]

## 2022-03-02 ENCOUNTER — Other Ambulatory Visit: Payer: Self-pay

## 2022-03-02 NOTE — Telephone Encounter (Signed)
Verify dose direction as critical warning appears when I attempt to refill.

## 2022-03-04 MED ORDER — INSULIN NPH (HUMAN) (ISOPHANE) 100 UNIT/ML ~~LOC~~ SUSP
200.0000 [IU] | Freq: Every day | SUBCUTANEOUS | 0 refills | Status: DC
Start: 2022-03-04 — End: 2022-05-15

## 2022-03-05 ENCOUNTER — Ambulatory Visit: Payer: Medicare Other | Admitting: Pharmacist

## 2022-03-05 NOTE — Patient Instructions (Signed)
Visit Information  Phone number for Pharmacist: [phone]  Thank you for meeting with me to discuss your medications! Below is a summary of what we talked about during the visit:   Recommendations/Changes made from today's visit: -When received, start Ozempic 0.25 mg weekly and Reduce insulin: Novolin N 160-180 units HS; Novolin R reduce by 50 units  Follow up plan: -Pharmacist follow up televisit scheduled for 1 month -PCP appt 04/01/22; Endocrine appt 04/02/22; cardiology appt 06/24/22   Charlene Brooke, PharmD, BCACP Clinical Pharmacist [place] [facility] [phone]

## 2022-03-06 ENCOUNTER — Telehealth: Payer: Self-pay | Admitting: Family Medicine

## 2022-03-06 NOTE — Telephone Encounter (Signed)
Pt called in requesting a call back regarding  the Freestyle sensor that was discuss . Please advise [phone]

## 2022-03-06 NOTE — Telephone Encounter (Signed)
Spoke with patient, he requests Freestyle Libre 2 sensor refills be sent to [facility]. Left VM at [facility] with prescription details:  Freestyle Libre 2 sensors Apply 1 sensor as directed every 14 days Dispense #2 with 6 refills.

## 2022-03-08 NOTE — Telephone Encounter (Signed)
Libre sensor-need to move to [employer], [facility] does not file Medicare   [place], [place] Phone: [phone]  Fax: [phone]     Patient wants a back-up script sent for: glucose blood (GE100 BLOOD GLUCOSE TEST) test strip   [place], [place] Phone: [phone]  Fax: [phone]

## 2022-03-08 NOTE — Telephone Encounter (Cosign Needed)
Patient notified to contact [facility] for refills.    Donald [name], [place] Assistant [phone]

## 2022-03-08 NOTE — Telephone Encounter (Signed)
It turns out [employer] Part D will not pay for CGM at the pharmacy level. His Part B/supplement will continue to cover [place] 3 through DME [facility]).  I have faxed last office note with PCP (from 12/31/21) to [facility] so they can complete his refills for [place] 3 sensors.

## 2022-03-13 ENCOUNTER — Telehealth: Payer: Self-pay

## 2022-03-13 NOTE — Telephone Encounter (Signed)
Spoke with patient, he will come to office 03/14/22 @ 2pm. He will pick up Ozempic and I will demonstrate how to administer dose.

## 2022-03-13 NOTE — Telephone Encounter (Signed)
Received pt's med shipment of Ozempic 0.25 mg/0.5 mg (5 boxes total).  Spoke with pt notifying him of med shipment above. However, pt states he was told by Mendel Ryder he was to come to our office for Ozempic inj once a week. Will fwd to [place] to confirm this with pt.  [Placed Ozempic (5 boxes total) in 2nd refrigerator, top shelf.]

## 2022-03-20 ENCOUNTER — Telehealth: Payer: Self-pay | Admitting: Family Medicine

## 2022-03-20 MED ORDER — FREESTYLE LIBRE 2 SENSOR MISC: 1.0000 | 3 refills | Status: DC

## 2022-03-20 MED ORDER — FREESTYLE LIBRE 2 READER DEVI: 0 refills | Status: DC

## 2022-03-20 NOTE — Addendum Note (Signed)
Addended by: Ria Bush on: 03/20/2022 05:17 PM   Modules accepted: Orders

## 2022-03-20 NOTE — Telephone Encounter (Signed)
Pt called stating [facility] is requiring a faxed prescription from Dr. Darnell Level for the [facility] 2. Fax # [phone]

## 2022-03-20 NOTE — Telephone Encounter (Signed)
Printed and in [employer].

## 2022-03-21 NOTE — Telephone Encounter (Signed)
Faxed rxs for [employer] Reader and Sensors to [employer].

## 2022-03-22 ENCOUNTER — Other Ambulatory Visit: Payer: Self-pay

## 2022-03-22 MED ORDER — FUROSEMIDE 80 MG PO TABS: ORAL_TABLET | ORAL | 0 refills | Status: DC

## 2022-03-26 ENCOUNTER — Other Ambulatory Visit: Payer: Self-pay | Admitting: Internal Medicine

## 2022-03-26 DIAGNOSIS — Z794 Long term (current) use of insulin: Secondary | ICD-10-CM

## 2022-03-27 ENCOUNTER — Other Ambulatory Visit: Payer: Self-pay | Admitting: Cardiovascular Disease

## 2022-04-01 ENCOUNTER — Encounter: Payer: Self-pay | Admitting: Family Medicine

## 2022-04-01 ENCOUNTER — Ambulatory Visit (INDEPENDENT_AMBULATORY_CARE_PROVIDER_SITE_OTHER): Payer: Medicare Other | Admitting: Family Medicine

## 2022-04-01 VITALS — BP 120/40 | HR 60 | Temp 97.2°F | Ht 66.0 in | Wt 264.4 lb

## 2022-04-01 DIAGNOSIS — I10 Essential (primary) hypertension: Secondary | ICD-10-CM

## 2022-04-01 DIAGNOSIS — N1832 Chronic kidney disease, stage 3b: Secondary | ICD-10-CM

## 2022-04-01 DIAGNOSIS — E1122 Type 2 diabetes mellitus with diabetic chronic kidney disease: Secondary | ICD-10-CM | POA: Diagnosis not present

## 2022-04-01 DIAGNOSIS — M545 Low back pain, unspecified: Secondary | ICD-10-CM | POA: Diagnosis not present

## 2022-04-01 DIAGNOSIS — Z794 Long term (current) use of insulin: Secondary | ICD-10-CM

## 2022-04-01 DIAGNOSIS — E1142 Type 2 diabetes mellitus with diabetic polyneuropathy: Secondary | ICD-10-CM

## 2022-04-01 DIAGNOSIS — G8929 Other chronic pain: Secondary | ICD-10-CM

## 2022-04-01 DIAGNOSIS — N183 Chronic kidney disease, stage 3 unspecified: Secondary | ICD-10-CM | POA: Diagnosis not present

## 2022-04-01 LAB — POCT GLYCOSYLATED HEMOGLOBIN (HGB A1C): Hemoglobin A1C: 7.4 % — AB (ref 4.0–5.6)

## 2022-04-01 NOTE — Assessment & Plan Note (Signed)
BP somewhat low today - and he notes some orthostatic unsteadiness when first standing.  No med changes today, I asked him to start monitoring BP more closely at home and let me know if staying >140/90 or <100/50.

## 2022-04-01 NOTE — Assessment & Plan Note (Addendum)
Regularly sees nephrology ([place]).

## 2022-04-01 NOTE — Patient Instructions (Addendum)
We will request records of latest diabetic eye exam at [facility].  Increase water intake. BP was a bit low today - monitor at home and let me know if too low or too high.  Keep appointment with Dr Kelton Pillar tomorrow.  Return in 3 months for physical.

## 2022-04-01 NOTE — Assessment & Plan Note (Signed)
Followed by nephrology. 

## 2022-04-01 NOTE — Progress Notes (Signed)
Patient ID: Donald [name], male    DOB: 06-Jan-1946, 77 y.o.   MRN: [id number]  This visit was conducted in person.  BP (!) 120/40 (BP Location: Right Arm, Cuff Size: Large)   Pulse 60   Temp (!) 97.2 F (36.2 C) (Temporal)   Ht 5\' 6"  (1.676 m)   Wt 264 lb 6 oz (119.9 kg)   SpO2 96%   BMI 42.67 kg/m   BP Readings from Last 3 Encounters:  04/01/22 (!) 120/40  12/31/21 130/64  12/17/21 (!) 140/60    CC: 3 mo f/u visit  Subjective:   HPI: Donald [name] is a 77 y.o. male presenting on 04/01/2022 for Medical Management of Chronic Issues (Here for 3 mo DM f/u. )   DM - pending appt with Dr Kelton Pillar to establish for endo care tomorrow. He continues NPH 220u nightly with novolin R 200/140/280u daily with meals. Continues using freestyle libre 2 CGM. Also has GE meter to check sugars 3-4x daily. Mounjaro stopped 2022 due to cost.  Denies hyperglycemia symptoms of blurry vision or paresthesias.  Eye doctor with [facility] yearly - upcoming appt summer 2024.  He sees podiatrist [facility] - last seen 12/2030 but for foot arthritis, not diabetic foot exam. Sees nephrologist Dr Marval Regal yearly in [place].  Lab Results  Component Value Date   HGBA1C 7.4 (A) 04/01/2022    Diabetic Foot Exam - Simple   Simple Foot Form Diabetic Foot exam was performed with the following findings: Yes 04/01/2022 11:08 AM  Visual Inspection No deformities, no ulcerations, no other skin breakdown bilaterally: Yes Sensation Testing Intact to touch and monofilament testing bilaterally: Yes Pulse Check See comments: Yes Comments Diminished pulses bilaterally Pedal edema present     He did see our pharmacist 12/2021 for CGM teaching. Last visit we also sent trulicity for patient to price out - however he was approved for Ozempic PAP - pending starting this with plan to reduce novolin N to 160-180u HS and reduce Novolin R by 50 u.   He has taken ozempic for the past 3 weeks, at  0.25mg  weekly and overal tolerating well however he notes occasional nausea, tolerable at this time.   He did well when using Freestyle Libre 2 however had issues where it would wake him up every night with alarm but when he checked sugars were 120s.  Has had trouble getting latest Rx for CGM filled.   Increased weight and BP noted today - he's been eating more potatoes due to wife's diet limitations (soft foods).   Notes worsening lower back pain - he takes hydrocodone 10/325mg  BID for this as well as gabapentin 1200mg  at bedtime. ESI back in 02/2022 didn't seem to help. Regularly sees Dr Nelva Bush [facility].      Relevant past medical, surgical, family and social history reviewed and updated as indicated. Interim medical history since our last visit reviewed. Allergies and medications reviewed and updated. Outpatient Medications Prior to Visit  Medication Sig Dispense Refill   albuterol (VENTOLIN HFA) 108 (90 Base) MCG/ACT inhaler Inhale 2 puffs into the lungs every 6 (six) hours as needed for wheezing or shortness of breath. 8 g 0   allopurinol (ZYLOPRIM) 300 MG tablet TAKE ONE TABLET BY MOUTH EVERY DAY 90 tablet 3   ALPRAZolam (XANAX) 1 MG tablet TAKE 1 TABLET BY MOUTH AT BEDTIME IF NEEDED FOR SLEEP. 30 tablet 0   aspirin 81 MG EC tablet Take 81 mg by mouth  daily. Swallow whole.     Cholecalciferol (VITAMIN D3) 25 MCG (1000 UT) CAPS Take 1 capsule (1,000 Units total) by mouth daily. 30 capsule    colchicine 0.6 MG tablet TAKE 1 TABLET BY MOUTH DAILY AS NEEDED FOR GOUT GLARE. ON FIRST DAY OF GOUT FLARE,MAY TAKE 2 TABLETS AT ONCE AS DIRECTED 30 tablet 0   Continuous Blood Gluc Receiver (FREESTYLE LIBRE 2 READER) DEVI Use as directed to check sugars daily 1 each 0   Continuous Blood Gluc Sensor (FREESTYLE LIBRE 2 SENSOR) MISC 1 Device by Does not apply route every 14 (fourteen) days. 6 each 3   cyanocobalamin (,VITAMIN B-12,) 1000 MCG/ML injection INJECT 1ML INTRAMUSCULARLY EVERY 30 DAYS 1  mL 3   cyclobenzaprine (FLEXERIL) 10 MG tablet Take 10 mg by mouth 3 (three) times daily as needed for muscle spasms.     doxazosin (CARDURA) 1 MG tablet TAKE ONE TABLET BY MOUTH TWICE DAILY 180 tablet 1   fluticasone (FLONASE) 50 MCG/ACT nasal spray Place 2 sprays into both nostrils daily. 16 g 1   furosemide (LASIX) 80 MG tablet TAKE ONE TABLET BY MOUTH EVERY DAY *MAY TAKE AN EXTRA PILL FOR SWELLING* 90 tablet 0   gabapentin (NEURONTIN) 600 MG tablet TAKE TWO TABLETS AT BEDTIME 180 tablet 3   glucose blood (GE100 BLOOD GLUCOSE TEST) test strip TEST FOUR TIMES DAILY 200 each 10   HYDROcodone-acetaminophen (NORCO) 10-325 MG tablet Take 1 tablet by mouth every 6 (six) hours as needed for moderate pain ((typically once to twice daily if needed)).  30 tablet 0   insulin NPH Human (NOVOLIN N) 100 UNIT/ML injection Inject 2 mLs (200 Units total) into the skin at bedtime. 100 mL 0   isosorbide mononitrate (IMDUR) 30 MG 24 hr tablet TAKE ONE TABLET BY MOUTH DAILY 90 tablet 2   losartan (COZAAR) 25 MG tablet TAKE ONE TABLET BY MOUTH AT BEDTIME 90 tablet 1   lovastatin (MEVACOR) 40 MG tablet TAKE TWO TABLETS BY MOUTH AT BEDTIME 180 tablet 1   meclizine (ANTIVERT) 25 MG tablet Take 25 mg by mouth 3 (three) times daily as needed for dizziness.     Naphazoline-Pheniramine (OPCON-A OP) Place 1 drop into both eyes 3 (three) times daily as needed (itchy eyes.).      nitroGLYCERIN (NITROSTAT) 0.4 MG SL tablet Place 1 tablet (0.4 mg total) under the tongue every 5 (five) minutes as needed for chest pain. 25 tablet 3   NOVOLIN R 100 UNIT/ML injection INJECT 200 UNITS IN THE AM, 140 UNITS IN THE AFTERNOON, AND 280 UNITS IN THE EVENING - USE BEFORE A MEAL 210 mL 0   ondansetron (ZOFRAN) 4 MG tablet Take 1 tablet (4 mg total) by mouth 3 (three) times daily as needed for nausea. 20 tablet 0   Potassium Citrate 15 MEQ (1620 MG) TBCR Take 1 tablet by mouth 3 (three) times daily.     Semaglutide,0.25 or 0.5MG /DOS,  (OZEMPIC, 0.25 OR 0.5 MG/DOSE,) 2 MG/3ML SOPN Inject 0.25 mg weekly x 4 weeks, then increase to 0.5 mg weekly     Sennosides-Docusate Sodium (STOOL SOFTENER & LAXATIVE PO) Take 2 tablets by mouth at bedtime.     Syringe/Needle, Disp, (SYRINGE 3CC/22GX1-1/2") 22G X 1-1/2" 3 ML MISC Use to administer monthly b12 shots 50 each 0   No facility-administered medications prior to visit.     Per HPI unless specifically indicated in ROS section below Review of Systems  Objective:  BP (!) 120/40 (BP Location: Right Arm,  Cuff Size: Large)   Pulse 60   Temp (!) 97.2 F (36.2 C) (Temporal)   Ht 5\' 6"  (1.676 m)   Wt 264 lb 6 oz (119.9 kg)   SpO2 96%   BMI 42.67 kg/m   Wt Readings from Last 3 Encounters:  04/01/22 264 lb 6 oz (119.9 kg)  12/31/21 255 lb 3.2 oz (115.8 kg)  12/17/21 259 lb 6 oz (117.7 kg)      Physical Exam Vitals and nursing note reviewed.  Constitutional:      Appearance: Normal appearance. He is not ill-appearing.     Comments: Ambulates with cane  HENT:     Head: Normocephalic and atraumatic.     Mouth/Throat:     Mouth: Mucous membranes are moist.     Pharynx: Oropharynx is clear. No oropharyngeal exudate or posterior oropharyngeal erythema.  Eyes:     Extraocular Movements: Extraocular movements intact.     Conjunctiva/sclera: Conjunctivae normal.     Pupils: Pupils are equal, round, and reactive to light.  Cardiovascular:     Rate and Rhythm: Tachycardia present. Rhythm irregular.     Pulses: Normal pulses.     Heart sounds: Normal heart sounds. No murmur heard. Pulmonary:     Effort: Pulmonary effort is normal. No respiratory distress.     Breath sounds: Normal breath sounds. No wheezing, rhonchi or rales.  Musculoskeletal:     Right lower leg: Edema (1+) present.     Left lower leg: Edema (1+) present.     Comments: See HPI for foot exam if done  Skin:    General: Skin is warm and dry.     Findings: No rash.  Neurological:     Mental Status: He is  alert.  Psychiatric:        Mood and Affect: Mood normal.        Behavior: Behavior normal.       Results for orders placed or performed in visit on 04/01/22  POCT glycosylated hemoglobin (Hb A1C)  Result Value Ref Range   Hemoglobin A1C 7.4 (A) 4.0 - 5.6 %   HbA1c POC (<> result, manual entry)     HbA1c, POC (prediabetic range)     HbA1c, POC (controlled diabetic range)     Lab Results  Component Value Date   CREATININE 1.88 (H) 12/31/2021   BUN 42 (H) 12/31/2021   NA 143 12/31/2021   K 4.4 12/31/2021   CL 100 12/31/2021   CO2 31 12/31/2021    Assessment & Plan:   Problem List Items Addressed This Visit     Essential hypertension    BP somewhat low today - and he notes some orthostatic unsteadiness when first standing.  No med changes today, I asked him to start monitoring BP more closely at home and let me know if staying >140/90 or <100/50.       Chronic low back pain    Known lumbar DDD, sees PM&R Dr Nelva Bush, latest [facility] 01/2022 with resultant hyperglycemia.       Obesity, morbid, BMI 40.0-49.9 ([place])    Discussed relation of weight to back pain - hopeful Ozempic will help with weight loss.       CKD stage 3 due to type 2 diabetes mellitus ([place])    Regularly sees nephrology ([place]).       Type 2 diabetes mellitus with diabetic polyneuropathy (H[facility]) - Primary    Chronic, improvement noted.  He's having trouble getting CGM filled through pharmacy but  has benefited from its use.  He recently started ozempic 0.25mg  weekly x the past 3 weeks, overall well tolerated and is planning to increase to 0.5mg  weekly.  He is planning to establish with endocrinology tomorrow - appreciate Dr [facility] care in this pt with high insulin need.  Foot exam today. I have requested latest DM eye exam done summer 2023 at [facility].      Relevant Orders   POCT glycosylated hemoglobin (Hb A1C) (Completed)   Type 2 diabetes mellitus with diabetic chronic kidney disease ([place])     Followed by nephrology.         No orders of the defined types were placed in this encounter.   Orders Placed This Encounter  Procedures   POCT glycosylated hemoglobin (Hb A1C)    Patient Instructions  We will request records of latest diabetic eye exam at [facility].  Increase water intake. BP was a bit low today - monitor at home and let me know if too low or too high.  Keep appointment with Dr Kelton Pillar tomorrow.  Return in 3 months for physical.   Follow up plan: Return in about 3 months (around 07/02/2022), or if symptoms worsen or fail to improve, for medicare wellness visit.  Ria Bush, MD

## 2022-04-01 NOTE — Assessment & Plan Note (Signed)
Discussed relation of weight to back pain - hopeful Ozempic will help with weight loss.

## 2022-04-01 NOTE — Assessment & Plan Note (Signed)
Known lumbar DDD, sees PM&R Dr Nelva Bush, latest [facility] 01/2022 with resultant hyperglycemia.

## 2022-04-01 NOTE — Assessment & Plan Note (Signed)
Chronic, improvement noted.  He's having trouble getting CGM filled through pharmacy but has benefited from its use.  He recently started ozempic 0.25mg  weekly x the past 3 weeks, overall well tolerated and is planning to increase to 0.5mg  weekly.  He is planning to establish with endocrinology tomorrow - appreciate Dr [facility] care in this pt with high insulin need.  Foot exam today. I have requested latest DM eye exam done summer 2023 at [facility].

## 2022-04-02 ENCOUNTER — Ambulatory Visit (INDEPENDENT_AMBULATORY_CARE_PROVIDER_SITE_OTHER): Payer: Medicare Other | Admitting: Internal Medicine

## 2022-04-02 ENCOUNTER — Encounter: Payer: Self-pay | Admitting: Internal Medicine

## 2022-04-02 VITALS — BP 120/80 | HR 71 | Ht 66.0 in | Wt 261.0 lb

## 2022-04-02 DIAGNOSIS — E1122 Type 2 diabetes mellitus with diabetic chronic kidney disease: Secondary | ICD-10-CM

## 2022-04-02 DIAGNOSIS — E1142 Type 2 diabetes mellitus with diabetic polyneuropathy: Secondary | ICD-10-CM

## 2022-04-02 DIAGNOSIS — E1165 Type 2 diabetes mellitus with hyperglycemia: Secondary | ICD-10-CM | POA: Diagnosis not present

## 2022-04-02 DIAGNOSIS — E119 Type 2 diabetes mellitus without complications: Secondary | ICD-10-CM

## 2022-04-02 DIAGNOSIS — N1832 Chronic kidney disease, stage 3b: Secondary | ICD-10-CM | POA: Diagnosis not present

## 2022-04-02 DIAGNOSIS — Z794 Long term (current) use of insulin: Secondary | ICD-10-CM

## 2022-04-02 LAB — POCT GLUCOSE (DEVICE FOR HOME USE): POC Glucose: 124 mg/dl — AB (ref 70–99)

## 2022-04-02 MED ORDER — FREESTYLE LIBRE 2 SENSOR MISC: 1.0000 | 3 refills | Status: DC

## 2022-04-02 NOTE — Progress Notes (Unsigned)
Name: Donald [name]  Age/ Sex: 77 y.o., male   MRN/ DOB: [id number], 1945/01/18     PCP: Ria Bush, MD   Reason for Endocrinology Evaluation: Type 2 Diabetes Mellitus  Initial Endocrine Consultative Visit: 12/25/2011    PATIENT IDENTIFIER: Donald [name] is a 77 y.o. male with a past medical history of DM, CAD, Asthma, OSA and Dyslipidemia . The patient has followed with Endocrinology clinic since 12/25/2011 for consultative assistance with management of his diabetes.  DIABETIC HISTORY:  Donald [name] was diagnosed with DM 1989.  Pioglitazone-swelling, Metformin - low GFR, he has been on insulin since 1991. His hemoglobin A1c has ranged from 6.9% in 2019, peaking at 10.1% in 2012.   He was followed by Dr. Loanne Drilling from 2013 until 04/2021  Ozempic  started by PCP 02/2022  SUBJECTIVE:   During the last visit (05/02/2021): Saw Dr. Loanne Drilling    Today (04/02/2022): Donald [name] is here for follow-up on diabetes management.  He checks his blood sugars 2-3 times daily. The patient has  had hypoglycemic episodes since the last clinic visit, which typically occur at night . The patient is  symptomatic with these episodes  Changes his Novolin-N dose at bedtime based on his BG reading  He eats 2 meals a day  Has chronic back pain , receives intra-articular injections   Has minimal nausea with ozempic   HOME DIABETES REGIMEN:  Ozempic 0.25 mg weekly  Novolin-N 140-180  bedtime  Novolin-R  140-150 before brunch and Supper    Statin: Yes ACE-I/ARB: Yes    METER DOWNLOAD SUMMARY: did not bring    DIABETIC COMPLICATIONS: Microvascular complications:  CKD, neuropathy Denies:  Last Eye Exam: Completed   Macrovascular complications:  CAD Denies:  CVA, PVD   HISTORY:  Past Medical History:  Past Medical History:  Diagnosis Date   (HFpEF) heart failure with preserved ejection fraction ([place])    a. 09/2018 Echo: EF 60-65%. PASP 62mmHg. Mild-mod LAE. Mild MR/TR.    ANEMIA-IRON DEFICIENCY 07/26/2006   Anxiety    ASTHMA 07/26/2006   Asthma    Back pain, chronic    Bell's palsy    CKD (chronic kidney disease), stage III ([place])    COLONIC POLYPS, HX OF 07/26/2006   COVID-19 virus infection 01/2020   DEPRESSION 03/14/2009   DIABETES MELLITUS, TYPE II 07/26/2006   [place] DISEASE, LUMBAR 10/05/2007   DVT 12/03/2007   DYSLIPIDEMIA 04/13/2009   Dyspnea    when gets up and walks around and back is hurting really bad -only Shortness of breath  then   GERD 07/26/2006   Gout    Heart murmur    History of kidney stones    HYPERTENSION 07/26/2006   INSOMNIA 08/21/2007   Neuropathy    Nonobstructive CAD (coronary artery disease)    a. 2012 Cath: no high grade stenosis; b. 2018 MV: No ischemia. Attenuation artifact.    OBSTRUCTIVE SLEEP APNEA 12/03/2007   Use C-PAP   PERIPHERAL NEUROPATHY 07/26/2006   Pernicious anemia 11/20/2006   PULMONARY EMBOLISM 10/05/2007   Vertigo    Past Surgical History:  Past Surgical History:  Procedure Laterality Date   [place], 06/14/2008   Dr. Trenton Gammon at [facility] [phone])   [place] Right 10/2017   CATARACT EXTRACTION W/PHACO Left 05/30/2014   Procedure: CATARACT EXTRACTION PHACO AND INTRAOCULAR LENS PLACEMENT ([place]);  Surgeon: Estill Cotta, MD;  Location: [facility] ORS;  Service: Ophthalmology;  Laterality: Left;  [name] 01:20 AP% 23.7 CDE 31.86   CATARACT EXTRACTION W/PHACO Right 09/06/2021   Procedure: CATARACT EXTRACTION PHACO AND INTRAOCULAR LENS PLACEMENT ([place]) RIGHT DIABETIC VISION BLUE OMIDRIA MALYUGIN IRIS HOOKS;  Surgeon: Norvel Richards, MD;  Location: [facility];  Service: Ophthalmology;  Laterality: Right;  14.65 2.11.1   COLONOSCOPY  10/2002   HP, SSA, TA, rpt 3 yrs ([place])   COLONOSCOPY  01/2005   diverticulosis rpt 5 yrs ([place])    EPIDURAL BLOCK INJECTION Bilateral 06/2021   bilat L5 nerve block with benefit  ([place])   IR NEPHROSTOMY PLACEMENT LEFT  10/19/2018   IR URETERAL STENT LEFT NEW ACCESS W/O SEP NEPHROSTOMY CATH  10/19/2018   LEFT HEART CATH AND CORONARY ANGIOGRAPHY N/A 11/01/2020   Procedure: LEFT HEART CATH AND CORONARY ANGIOGRAPHY;  Surgeon: Nelva Bush, MD;  Location: [facility];  Service: Cardiovascular;  Laterality: N/A;   LITHOTRIPSY     X 2   NEPHROLITHOTOMY Left 10/19/2018   Procedure: NEPHROLITHOTOMY PERCUTANEOUS;  Surgeon: Franchot Gallo, MD;  Location: [facility];  Service: Urology;  Laterality: Left;  3 HRS   PARATHYROIDECTOMY Right 04/18/2016   PARATHYROIDECTOMY for cyst [name], MD)   SHOULDER ARTHROSCOPY W/ ROTATOR CUFF REPAIR Right    TONSILLECTOMY     Social History:  reports that he has never smoked. He has never used smokeless tobacco. He reports that he does not drink alcohol and does not use drugs. Family History:  Family History  Problem Relation Age of Onset   Cancer Mother        Breast Cancer   Cancer Sister        Breast Cancer   Diabetes Father    Heart disease Father    Diabetes Paternal Grandfather      HOME MEDICATIONS: Allergies as of 04/02/2022       Reactions   Codeine Other (See Comments)   REACTION: chest pain   Pioglitazone Other (See Comments)   REACTION to Actos: swelling in ankles        Medication List        Accurate as of April 02, 2022  2:30 PM. If you have any questions, ask your nurse or doctor.          STOP taking these medications    FreeStyle Libre 2 Reader Kerrin Mo Stopped by: Dorita Sciara, MD       TAKE these medications    albuterol 108 (90 Base) MCG/ACT inhaler Commonly known as: VENTOLIN HFA Inhale 2 puffs into the lungs every 6 (six) hours as needed for wheezing or shortness of breath.   allopurinol 300 MG tablet Commonly known as: ZYLOPRIM TAKE ONE TABLET BY MOUTH EVERY DAY   ALPRAZolam 1 MG tablet Commonly known as: XANAX TAKE 1 TABLET BY MOUTH AT BEDTIME IF  NEEDED FOR SLEEP.   aspirin EC 81 MG tablet Take 81 mg by mouth daily. Swallow whole.   colchicine 0.6 MG tablet TAKE 1 TABLET BY MOUTH DAILY AS NEEDED FOR GOUT GLARE. ON FIRST DAY OF GOUT FLARE,MAY TAKE 2 TABLETS AT ONCE AS DIRECTED   cyanocobalamin 1000 MCG/ML injection Commonly known as: VITAMIN B12 INJECT 1ML INTRAMUSCULARLY EVERY 30 DAYS   cyclobenzaprine 10 MG tablet Commonly known as: FLEXERIL Take 10 mg by mouth 3 (three) times daily as needed for muscle spasms.   doxazosin 1 MG tablet Commonly known as: CARDURA TAKE ONE TABLET BY MOUTH TWICE DAILY   fluticasone 50  MCG/ACT nasal spray Commonly known as: FLONASE Place 2 sprays into both nostrils daily.   FreeStyle Libre 2 Sensor Misc 1 Device by Does not apply route every 14 (fourteen) days.   furosemide 80 MG tablet Commonly known as: LASIX TAKE ONE TABLET BY MOUTH EVERY DAY *MAY TAKE AN EXTRA PILL FOR SWELLING*   gabapentin 600 MG tablet Commonly known as: NEURONTIN TAKE TWO TABLETS AT BEDTIME   GE100 Blood Glucose Test test strip Generic drug: glucose blood TEST FOUR TIMES DAILY   HYDROcodone-acetaminophen 10-325 MG tablet Commonly known as: NORCO Take 1 tablet by mouth every 6 (six) hours as needed for moderate pain ((typically once to twice daily if needed)).   insulin NPH Human 100 UNIT/ML injection Commonly known as: NovoLIN N Inject 2 mLs (200 Units total) into the skin at bedtime.   isosorbide mononitrate 30 MG 24 hr tablet Commonly known as: IMDUR TAKE ONE TABLET BY MOUTH DAILY   losartan 25 MG tablet Commonly known as: COZAAR TAKE ONE TABLET BY MOUTH AT BEDTIME   lovastatin 40 MG tablet Commonly known as: MEVACOR TAKE TWO TABLETS BY MOUTH AT BEDTIME   meclizine 25 MG tablet Commonly known as: ANTIVERT Take 25 mg by mouth 3 (three) times daily as needed for dizziness.   nitroGLYCERIN 0.4 MG SL tablet Commonly known as: NITROSTAT Place 1 tablet (0.4 mg total) under the tongue every 5  (five) minutes as needed for chest pain.   NovoLIN R 100 units/mL injection Generic drug: insulin regular INJECT 200 UNITS IN THE AM, 140 UNITS IN THE AFTERNOON, AND 280 UNITS IN THE EVENING - USE BEFORE A MEAL   ondansetron 4 MG tablet Commonly known as: ZOFRAN Take 1 tablet (4 mg total) by mouth 3 (three) times daily as needed for nausea.   OPCON-A OP Place 1 drop into both eyes 3 (three) times daily as needed (itchy eyes.).   Ozempic (0.25 or 0.5 MG/DOSE) 2 MG/3ML Sopn Generic drug: Semaglutide(0.25 or 0.5MG /DOS) Inject 0.25 mg weekly x 4 weeks, then increase to 0.5 mg weekly   Potassium Citrate 15 MEQ (1620 MG) Tbcr Take 1 tablet by mouth 3 (three) times daily.   STOOL SOFTENER & LAXATIVE PO Take 2 tablets by mouth at bedtime.   SYRINGE 3CC/22GX1-1/2" 22G X 1-1/2" 3 ML Misc Use to administer monthly b12 shots   Vitamin D3 25 MCG (1000 UT) capsule Generic drug: Cholecalciferol Take 1 capsule (1,000 Units total) by mouth daily.         OBJECTIVE:   Vital Signs: BP 120/80 (BP Location: Left Arm, Patient Position: Sitting, Cuff Size: Large)   Pulse 71   Ht 5\' 6"  (1.676 m)   Wt 261 lb (118.4 kg)   SpO2 95%   BMI 42.13 kg/m   Wt Readings from Last 3 Encounters:  04/02/22 261 lb (118.4 kg)  04/01/22 264 lb 6 oz (119.9 kg)  12/31/21 255 lb 3.2 oz (115.8 kg)     Exam: General: Pt appears well and is in NAD  Lungs: Clear with good BS bilat   Heart: RRR   Extremities: Trace  pretibial edema.   Neuro: MS is good with appropriate affect, pt is alert and Ox3      DATA REVIEWED:  Lab Results  Component Value Date   HGBA1C 7.4 (A) 04/01/2022   HGBA1C 7.9 (A) 12/31/2021   HGBA1C 7.5 (H) 06/20/2021     Latest Reference Range & Units 12/31/21 11:03  Sodium 135 - 145 mEq/L 143  Potassium 3.5 - 5.1 mEq/L 4.4  Chloride 96 - 112 mEq/L 100  CO2 19 - 32 mEq/L 31  Glucose 70 - 99 mg/dL 87  BUN 6 - 23 mg/dL 42 (H)  Creatinine 0.40 - 1.50 mg/dL 1.88 (H)  Calcium  8.4 - 10.5 mg/dL 9.3  Phosphorus 2.3 - 4.6 mg/dL 3.9  Albumin 3.5 - 5.2 g/dL 4.3  GFR >60.00 mL/min 34.88 (L)   Old records , labs and images have been reviewed.   ASSESSMENT / PLAN / RECOMMENDATIONS:   1) Type 2 Diabetes Mellitus, Sub-Optimally controlled, With neuropathic, CKD III complications - Most recent A1c of 7.4 %. Goal A1c < 7.0 %.    -His A1c has been trending down -He was recently started on Ozempic, he does have some nausea, he was supposed to increase the dose in 2 weeks, but I did advise him to wait a total of 6 weeks on 0.25 mg weekly before increasing to 0.5 mg dose.  -He also tends to adjust his Novolin-N dose based on his bedtime glucose reading, I did advise him to reduce the dose because with Ozempic his insulin sensitivity will improve and he will be more prone to hypoglycemia -I also advised him to avoid adjusting his regular insulin before the meal and we agreed on 140 units as a baseline before each meal, he will be provided with a correction scale to be used before each meal if needed for hyperglycemia -A prescription for freestyle Elenor [place]ato has been sent to [facility]  MEDICATIONS: Ozempic 0.25 mg weekly for 6 weeks, then increase to 0.5 mg weekly Novolin-N 140-170 units QHS Novolin-R 140 units before brunch and supper Correction scale: Novolin-R (BG -130/25)  EDUCATION / INSTRUCTIONS: BG monitoring instructions: Patient is instructed to check his blood sugars 3 times a day, before each meal. Call [facility] if: BG persistently < 70  I reviewed the Rule of 15 for the treatment of hypoglycemia in detail with the patient. Literature supplied.    2) Diabetic complications:  Eye: Does not have known diabetic retinopathy.  Neuro/ Feet: Does  have known diabetic peripheral neuropathy .  Renal: Patient does  have known baseline CKD. He   is  on an ACEI/ARB at present.     F/U in 3 months     Signed electronically by: Mack Guise,  MD  [facility]  [facility] [address] [place], [place] [address] Phone: [phone] FAX: [phone]   CC: Ria Bush, MD [place] [address] Phone: [phone]  Fax: [phone]  Return to Endocrinology clinic as below: Future Appointments  Date Time Provider [place]  04/17/2022  3:45 PM Charlton Haws, [place] None  06/24/2022 11:20 AM Minna Merritts, MD [facility] None  06/25/2022  9:00 AM [facility]  07/02/2022 12:30 PM Ria Bush, MD [facility]

## 2022-04-02 NOTE — Patient Instructions (Signed)
Ozempic 0.25 mg weekly for a total of 6 weeks, than increase to 0.5 mg weekly  Novolin-N 140-170 units at  bedtime  Novolin-R  140 units  before brunch and Supper  Novolin-R correctional insulin: ADD extra units on insulin to your meal-time Novolin- R dose if your blood sugars are higher than 155. Use the scale below to help guide you:   Blood sugar before meal Number of units to inject  Less than 155 0 unit  156 -  180 1 units  181 -  205 2 units  206 -  230 3 units  231 -  255 4 units  256 -  280 5 units  281 -  305 6 units     HOW TO TREAT LOW BLOOD SUGARS (Blood sugar LESS THAN 70 MG/DL) Please follow the RULE OF 15 for the treatment of hypoglycemia treatment (when your (blood sugars are less than 70 mg/dL)   STEP 1: Take 15 grams of carbohydrates when your blood sugar is low, which includes:  3-4 GLUCOSE TABS  OR 3-4 OZ OF JUICE OR REGULAR SODA OR ONE TUBE OF GLUCOSE GEL    STEP 2: RECHECK blood sugar in 15 MINUTES STEP 3: If your blood sugar is still low at the 15 minute recheck --> then, go back to STEP 1 and treat AGAIN with another 15 grams of carbohydrates.

## 2022-04-03 DIAGNOSIS — E1165 Type 2 diabetes mellitus with hyperglycemia: Secondary | ICD-10-CM | POA: Insufficient documentation

## 2022-04-03 MED ORDER — OZEMPIC (0.25 OR 0.5 MG/DOSE) 2 MG/3ML ~~LOC~~ SOPN: 0.5000 mg | PEN_INJECTOR | SUBCUTANEOUS | 6 refills | Status: DC

## 2022-04-12 ENCOUNTER — Telehealth: Payer: Self-pay

## 2022-04-12 NOTE — Progress Notes (Signed)
Care Management & Coordination Services Pharmacy Team  Reason for Encounter: Appointment Reminder  Contacted patient to confirm telephone appointment with Charlene Brooke , PharmD on 04/17/22 at 3:45. Unsuccessful outreach. Left voicemail for patient to return call.  Have you seen any other providers since your last visit with PCP? Yes- endocrinology  Hospital visits:  None in previous 6 months   Star Rating Drugs:  Medication:  Last Fill: Day Supply Novolin N  03/06/22 50 Novolin R  03/27/22 30 Lovastatin 25mg  03/27/22 90 Ozempic                 manufacturer Losartan   01/23/22 90  Care Gaps: Annual wellness visit in last year? Yes  If Diabetic: Last eye exam / retinopathy screening:2019 Last diabetic foot exam:UTD   Charlene Brooke, PharmD notified  Avel Sensor, [place] Assistant [phone]

## 2022-04-17 ENCOUNTER — Ambulatory Visit: Payer: Medicare Other | Admitting: Pharmacist

## 2022-04-17 NOTE — Patient Instructions (Signed)
Visit Information  Phone number for Pharmacist: [phone]  Thank you for meeting with me to discuss your medications! Below is a summary of what we talked about during the visit:   Recommendations/Changes made from today's visit: -Counseled on hypoglycemia prevention/treatment; discussed Ozempic insulin-sparing effect -Advised he may have to further reduce insulin when increasing Ozempic  Follow up plan: -Pharmacist follow up televisit scheduled for 1 month -Cardiology appt 06/24/22; PCP appt 07/02/22; Endocrine appt 09/13/22   Charlene Brooke, PharmD, BCACP Clinical Pharmacist [facility] [phone]

## 2022-04-17 NOTE — Progress Notes (Signed)
Care Management & Coordination Services Pharmacy Note  04/17/2022 Name:  Donald [name] MRN:  [id number] DOB:  1945-11-23  Summary: F/U visit -DM: A1c 7.9% (12/2021); pt started Ozempic 0.25 mg ~4 weeks ago and denies side effects; he reports 4 lb weight loss; he reports fasting BG 100-130 with 1 episode of 63, he thinks he took too much insulin the night before; he plans to increase Ozempic to 0.5 mg in 2 more weeks per instructions from endo -Pt is not wearing CGM, he did just receive his new supply of Freestyle Libre so he will restart today -HTN/HF: pt reports home BP 130/60, he denies further episode of low BP (was 120/40 in office last month)  Recommendations/Changes made from today's visit: -Counseled on hypoglycemia prevention/treatment; discussed Ozempic insulin-sparing effect -Advised he may have to further reduce insulin when increasing Ozempic  Follow up plan: -Pharmacist follow up televisit scheduled for 1 month -Cardiology appt 06/24/22; PCP appt 07/02/22; Endocrine appt 09/13/22    Subjective: Donald [name] is an 77 y.o. year old male who is a primary patient of Ria Bush, MD.  The care coordination team was consulted for assistance with disease management and care coordination needs.    Engaged with patient by telephone for follow up visit.  Recent office visits: 04/01/22 Dr Danise Mina OV: A1c 7.4%; BP 120/40 - monitor at home; no med changes. RTC 3 months  12/31/21 Dr Danise Mina OV: f/u - A1c 7.9%. GFR down 34. Request urine sample. DM - establishing new endo 03/2022. Asked to price out Trulicity - drop Novolin R by 50 units each dose and NPH to 200 units daily.  Recent consult visits: 04/02/22 Dr Kelton Pillar (Endocrine): TOC - update insulin: Novolin N 140-170 U HS; Novolin R 140 units before meals  12/17/21 Dr Rockey Situ (Cardiology): f/u - pt off Eliquis d/t cost. Taking aspirin. Rx'd NTG.   10/10/21 Dr Halford Chessman (Pulmonary): OSA - renewed CPAP   05/14/21 Dr Rockey Situ  (Cardiology): f/u - Increased lovastatin to 80 mg.   05/02/21 Dr Loanne Drilling (Endocrine): f/u DM - A1c 7.3%. Reduce NPH to 220 u HS. F/U 3 months.  Hospital visits: None in previous 6 months   Objective:  Lab Results  Component Value Date   CREATININE 1.88 (H) 12/31/2021   BUN 42 (H) 12/31/2021   GFR 34.88 (L) 12/31/2021   GFRNONAA 46 (L) 11/02/2020   GFRAA 52 (L) 10/19/2018   NA 143 12/31/2021   K 4.4 12/31/2021   CALCIUM 9.3 12/31/2021   CO2 31 12/31/2021   GLUCOSE 87 12/31/2021    Lab Results  Component Value Date/Time   HGBA1C 7.4 (A) 04/01/2022 10:30 AM   HGBA1C 7.9 (A) 12/31/2021 10:42 AM   HGBA1C 7.5 (H) 06/20/2021 09:07 AM   HGBA1C 7.0 (H) 10/30/2020 02:13 PM   FRUCTOSAMINE 319 (H) 10/21/2017 04:54 PM   FRUCTOSAMINE 296 (H) 03/11/2011 10:48 AM   GFR 34.88 (L) 12/31/2021 11:03 AM   GFR 47.82 (L) 06/20/2021 09:07 AM   MICROALBUR 6.3 (H) 01/02/2022 03:13 PM   MICROALBUR 12.0 (H) 03/02/2015 11:42 AM    Last diabetic Eye exam:  Lab Results  Component Value Date/Time   HMDIABEYEEXA Retinopathy (A) 05/21/2017 03:54 PM    Last diabetic Foot exam: No results found for: "HMDIABFOOTEX"   Lab Results  Component Value Date   CHOL 105 06/20/2021   HDL 36.30 (L) 06/20/2021   LDLCALC 48 06/20/2021   LDLDIRECT 52.2 05/25/2009   TRIG 104.0 06/20/2021   CHOLHDL 3 06/20/2021  Latest Ref Rng & Units 12/31/2021   11:03 AM 06/20/2021    9:07 AM 11/15/2020   11:57 AM  Hepatic Function  Total Protein 6.0 - 8.3 g/dL  6.1    Albumin 3.5 - 5.2 g/dL 4.3  4.1  3.9   AST 0 - 37 U/L  14    ALT 0 - 53 U/L  14    Alk Phosphatase 39 - 117 U/L  91    Total Bilirubin 0.2 - 1.2 mg/dL  1.3      Lab Results  Component Value Date/Time   TSH 2.156 10/31/2020 01:55 PM   TSH 2.448 10/30/2020 02:13 PM   TSH 3.01 03/09/2020 02:12 PM   TSH 3.14 10/21/2017 04:54 PM   FREET4 1.04 10/30/2020 02:13 PM       Latest Ref Rng & Units 12/31/2021   11:03 AM 11/02/2020    2:51 AM  11/01/2020    7:02 AM  CBC  WBC 4.0 - 10.5 K/uL 9.7  7.5  6.2   Hemoglobin 13.0 - 17.0 g/dL 13.5  13.8  13.6   Hematocrit 39.0 - 52.0 % 40.5  41.9  43.3   Platelets 150.0 - 400.0 K/uL 190.0  195  177     Lab Results  Component Value Date/Time   VD25OH 55.84 06/20/2021 09:07 AM   VD25OH 32.87 06/19/2020 08:24 AM   VITAMINB12 560 06/20/2021 09:07 AM   VITAMINB12 >1550 (H) 06/19/2020 08:24 AM    Clinical ASCVD: Yes  The ASCVD Risk score (Arnett DK, et al., 2019) failed to calculate for the following reasons:   The patient has a prior MI or stroke diagnosis    CHA2DS2/VAS Stroke Risk Points  Current as of 2 days ago (Monday)     7 >= 2 Points: High Risk  1 - 1.99 Points: Medium Risk  0 Points: Low Risk       04/01/2022   10:27 AM 06/26/2021   12:52 PM 06/21/2020    2:46 PM  Depression screen PHQ 2/9  Decreased Interest 0 3 0  Down, Depressed, Hopeless 0 0 0  PHQ - 2 Score 0 3 0  Altered sleeping 0 0 0  Tired, decreased energy 1 1 0  Change in appetite 0 0 0  Feeling bad or failure about yourself  0 0 0  Trouble concentrating 0 0 0  Moving slowly or fidgety/restless 0 0 0  Suicidal thoughts 0 0 0  PHQ-9 Score 1 4 0  Difficult doing work/chores Not difficult at all Not difficult at all      Social History   Tobacco Use  Smoking Status Never  Smokeless Tobacco Never   BP Readings from Last 3 Encounters:  04/02/22 120/80  04/01/22 (!) 120/40  12/31/21 130/64   Pulse Readings from Last 3 Encounters:  04/02/22 71  04/01/22 60  12/31/21 (!) 55   Wt Readings from Last 3 Encounters:  04/02/22 261 lb (118.4 kg)  04/01/22 264 lb 6 oz (119.9 kg)  12/31/21 255 lb 3.2 oz (115.8 kg)   BMI Readings from Last 3 Encounters:  04/02/22 42.13 kg/m  04/01/22 42.67 kg/m  12/31/21 41.19 kg/m    Allergies  Allergen Reactions   Codeine Other (See Comments)    REACTION: chest pain   Pioglitazone Other (See Comments)    REACTION to Actos: swelling in ankles     Medications Reviewed Today     Reviewed by Charlton Haws, [facility] (Pharmacist) on 04/17/22 at [phone]  Med List Status: <None>   Medication Order Taking? Sig Documenting Provider Last Dose Status Informant  albuterol (VENTOLIN HFA) 108 (90 Base) MCG/ACT inhaler [id number] Yes Inhale 2 puffs into the lungs every 6 (six) hours as needed for wheezing or shortness of breath. Ria Bush, MD Taking Active Self  allopurinol (ZYLOPRIM) 300 MG tablet [id number] Yes TAKE ONE TABLET BY MOUTH EVERY DAY Ria Bush, MD Taking Active   ALPRAZolam Duanne Moron) 1 MG tablet [id number] Yes TAKE 1 TABLET BY MOUTH AT BEDTIME IF NEEDED FOR SLEEP. Ria Bush, MD Taking Active   aspirin 81 MG EC tablet [id number] Yes Take 81 mg by mouth daily. Swallow whole. [provider] Taking Active   Cholecalciferol (VITAMIN D3) 25 MCG (1000 UT) CAPS [id number] Yes Take 1 capsule (1,000 Units total) by mouth daily. Ria Bush, MD Taking Active Self  colchicine 0.6 MG tablet [id number] Yes TAKE 1 TABLET BY MOUTH DAILY AS NEEDED FOR GOUT GLARE. ON FIRST DAY OF GOUT FLARE,MAY TAKE 2 TABLETS AT ONCE AS DIRECTED Ria Bush, MD Taking Active   Continuous Blood Gluc Sensor (FREESTYLE LIBRE 2 SENSOR) [place] [id number] Yes 1 Device by Does not apply route every 14 (fourteen) days. Shamleffer, Melanie Crazier, MD Taking Active   cyanocobalamin (,VITAMIN B-12,) 1000 MCG/ML injection [id number] Yes INJECT 1ML INTRAMUSCULARLY EVERY [place] Ria Bush, MD Taking Active   cyclobenzaprine (FLEXERIL) 10 MG tablet [id number] Yes Take 10 mg by mouth 3 (three) times daily as needed for muscle spasms. [provider] Taking Active Self  doxazosin (CARDURA) 1 MG tablet [id number] Yes TAKE ONE TABLET BY MOUTH TWICE DAILY Gollan, Kathlene November, MD Taking Active   fluticasone (FLONASE) 50 MCG/ACT nasal spray [id number] Yes Place 2 sprays into both nostrils daily. Elby Beck, FNP Taking Active Self   furosemide (LASIX) 80 MG tablet [id number] Yes TAKE ONE TABLET BY MOUTH EVERY DAY *MAY TAKE AN EXTRA PILL FOR SWELLING* Gollan, Kathlene November, MD Taking Active   gabapentin (NEURONTIN) 600 MG tablet [id number] Yes TAKE TWO TABLETS AT BEDTIME Ria Bush, MD Taking Active   glucose blood (GE100 BLOOD GLUCOSE TEST) test strip [id number] Yes TEST FOUR TIMES DAILY Shamleffer, Melanie Crazier, MD Taking Active   HYDROcodone-acetaminophen [facility]) 10-325 MG tablet [id number] Yes Take 1 tablet by mouth every 6 (six) hours as needed for moderate pain ((typically once to twice daily if needed)).  Ria Bush, MD Taking Active Self  insulin NPH Human (NOVOLIN N) 100 UNIT/ML injection [id number] Yes Inject 2 mLs (200 Units total) into the skin at bedtime.  Patient taking differently: Inject 140-170 Units into the skin at bedtime.   Shamleffer, Melanie Crazier, MD Taking Active            Med Note Malena Catholic Apr 17, 2022  4:04 PM)    isosorbide mononitrate (IMDUR) 30 MG 24 hr tablet [id number] Yes TAKE ONE TABLET BY MOUTH DAILY Rockey Situ Kathlene November, MD Taking Active   losartan (COZAAR) 25 MG tablet [id number] Yes TAKE ONE TABLET BY MOUTH AT BEDTIME Minna Merritts, MD Taking Active   lovastatin (MEVACOR) 40 MG tablet [id number] Yes TAKE TWO TABLETS BY MOUTH AT BEDTIME Minna Merritts, MD Taking Active   meclizine (ANTIVERT) 25 MG tablet [id number] Yes Take 25 mg by mouth 3 (three) times daily as needed for dizziness. [provider] Taking Active   Naphazoline-Pheniramine (OPCON-A OP) [id number] Yes Place 1 drop into both eyes 3 (three) times daily as needed (itchy eyes.).  [provider] Taking Active Self  nitroGLYCERIN (NITROSTAT) 0.4 MG SL tablet [id number] Yes Place 1 tablet (0.4 mg total) under the tongue every 5 (five) minutes as needed for chest pain. Minna Merritts, MD Taking Active   NOVOLIN R 100 UNIT/ML injection [id number] Yes INJECT 200 UNITS IN THE AM, 140 UNITS  IN THE AFTERNOON, AND 280 UNITS IN THE EVENING - USE BEFORE A MEAL  Patient taking differently: Inject 140 Units into the skin 2 (two) times daily before a meal. INJECT 200 UNITS IN THE AM, 140 UNITS IN THE AFTERNOON, AND 280 UNITS IN THE EVENING - USE BEFORE A MEAL   Shamleffer, Melanie Crazier, MD Taking Active   ondansetron (ZOFRAN) 4 MG tablet [id number] Yes Take 1 tablet (4 mg total) by mouth 3 (three) times daily as needed for nausea. Ria Bush, MD Taking Active   Potassium Citrate 15 MEQ (1620 MG) TBCR [id number] Yes Take 1 tablet by mouth 3 (three) times daily. [provider] Taking Active   Semaglutide,0.25 or 0.5MG /DOS, (OZEMPIC, 0.25 OR 0.5 MG/DOSE,) 2 MG/3ML SOPN [id number] Yes 0.5 mg by Other route once a week. Inject 0.25 mg weekly x 4 weeks, then increase to 0.5 mg weekly  Patient taking differently: Inject 0.5 mg into the skin once a week. Inject 0.25 mg weekly x 4 weeks, then increase to 0.5 mg weekly   Shamleffer, Melanie Crazier, MD Taking Active   Sennosides-Docusate Sodium (STOOL SOFTENER & LAXATIVE PO) [id number] Yes Take 2 tablets by mouth at bedtime. [provider] Taking Active Self  Syringe/Needle, Disp, (SYRINGE 3CC/22GX1-1/2") 22G X 1-1/2" 3 ML MISC [id number] Yes Use to administer monthly b12 shots Ria Bush, MD Taking Active Self  Med List Note Marylynn Pearson 10/12/18 1538): cpap machine at bedtime            SDOH:  (Social Determinants of Health) assessments and interventions performed: Yes SDOH Interventions    [place] Coordination from 02/06/2022 in [place] Coordination from 11/27/2021 in [place] [facility] from 07/31/2020 in [place] at [place] Management from 03/10/2020 in [place] at [place] from 04/20/2019 in [place] at [place] Interventions -- Intervention Not Indicated -- -- --  Housing Interventions -- Intervention Not Indicated -- -- --  Transportation Interventions -- Intervention Not Indicated -- -- --  Depression Interventions/Treatment  -- -- -- -- PHQ2-9 Score <4 Follow-up Not Indicated  Financial Strain Interventions Other (Comment)  [Brand name meds unaffordable - can pursue PAP] -- Other (Comment)  [Restart PAP for insulins] --  [Apply for insulin assistance] --       Medication Assistance: None required.  Patient affirms current coverage meets needs.  Medication Access: Within the past 30 days, how often has patient missed a dose of medication? 0 Is a pillbox or other method used to improve adherence? Yes  Factors that may affect medication adherence? financial need Are meds synced by current pharmacy? No  Are meds delivered by current pharmacy? No  Does patient experience delays in picking up medications due to transportation concerns? No   [facility] Services Reviewed: Is patient disadvantaged to use [facility]?: Yes  Current Rx insurance plan: [employer] [place] Name and location of Current pharmacy:  [address]  [place] [address] Phone: [phone] Fax: [phone]  [facility] services reviewed with patient today?: No  Patient requests to transfer care to [facility]?: No  Reason patient declined to change pharmacies: Disadvantaged due to insurance/mail order  Compliance/Adherence/Medication fill history: Care Gaps: Foot exam (due 06/2021) Eye exam (due 05/2018) - faxed request to [facility] for records 04/17/22  Star-Rating Drugs: Losartan - PDC 94% Lovastatin - PDC 100%   ASSESSMENT / PLAN   Hypertension / Heart Failure (BP goal <140/90) -Controlled - per home readings; recently there was concern for low BP in office (120/40) however home readings are  within goal -Current home readings: 130/60 -Daily weights: 250, 251 -Denies hypotensive/hypertensive symptoms; Denies any falls  -Last ejection fraction: 45-50% (Date: 10/2020) -HF type: HFmrEF (mildly reduced EF 41-49%) -NYHA Class: I-II; AHA HF Stage: C (Heart disease and symptoms present) -Current treatment: Losartan 25 mg daily HS - Appropriate, Effective, Safe, Accessible Furosemide 80 mg daily + extra PRN -Appropriate, Effective, Safe, Accessible Doxazosin 1 mg BID -Appropriate, Effective, Safe, Accessible Isosorbide MN 30 mg daily -Appropriate, Effective, Safe, Accessible -Medications previously tried: amlodipine - stopped per Cardio 04/2020 -Counseled to monitor BP at home weekly, document, and provide log at future appointments -Recommended to continue current medication  Diabetes (A1c goal <7%) -Not ideally controlled - A1c 7.9% (12/2021); pt recently started Ozempic 03/14/22 and reports no side effects; he has lost about 4 lbs -Has steroid injection every 3-4 months. -Follows with endocrine (Dr Kelton Pillar), est 03/2022 -Fasting BG: 100, 126, 63 Pt not currently wearing CGM, he had issues getting it from DME but finally received it yesterday;  Previous AGP report: 02/13/22 to 02/26/22. Sensor active: 84%  Time in range (70-180): 51% (goal > 70%)  High (180-250): 34%  Very high (>250): 13%  Low (< 70): 2% (goal < 4%)  GMI: 7.6%; Average glucose: 179  -Current medications: Novolin N - 140-170 units HS- Appropriate, Query Effective Novolin R - 140 units BID AC -Appropriate, Query Effective Ozempic 0.25 mg weekly (PAP) - Appropriate, Query Effective Freestyle Libre 2 ([place]) - Appropriate, Effective, Safe, Accessible -Medications previously tried: Engineer, agricultural (cost) -Reviewed insulin-sparing effect of Ozempic, discussed risk for hypoglycemia and monitoring glucose closely to prevent low glucose -Recommend to continue current medication   Atrial Fibrillation (Goal: prevent stroke  and major bleeding) -Not ideally controlled - off anticoagulation due to cost issues -CHADSVASC: 7 -Pt follows with cardiology (Dr Rockey Situ), pt deferred anticoagulation at most recent appt 12/2021 -Current treatment: Aspirin 81 mg daily - Query appropriate -Medications previously tried: Eliquis (cost) -Consider warfarin or Xarelto With Me program in future  Hyperlipidemia / CAD (LDL goal < 70) -Controlled- LDL 48 (06/2021) at goal -Hx NSTEMI 01/2021; hx PAD -Current treatment: Lovastatin 40 mg HS - Appropriate, Effective, Safe, Accessible Nitroglycerin 0.4 mg SL PRN -Appropriate, Effective, Safe, Accessible Isosorbide MN 30 mg daily -Appropriate, Effective, Safe, Accessible -Medications previously tried: n/a  -Educated on Cholesterol goals;  -Recommended to continue current medication  Back pain (Goal: manage pain) -Controlled -Follows with PM&R (Dr Nelva Bush) -Current treatment  Hydrocodone-APAP 7.5-325 mg #120/month - Appropriate, Effective, Safe, Accessible -Medications previously tried: n/a  -Recommended to continue current medication   Charlene Brooke, PharmD, BCACP Clinical Pharmacist [place] [facility] at [facility] [phone]

## 2022-04-23 ENCOUNTER — Other Ambulatory Visit: Payer: Self-pay | Admitting: Family Medicine

## 2022-04-23 NOTE — Telephone Encounter (Signed)
This has came from Dr. [name] in the past were you going to take over refilling for patient?

## 2022-04-23 NOTE — Telephone Encounter (Signed)
ERx 

## 2022-04-23 NOTE — Telephone Encounter (Signed)
Refill request cyanocobalamin Last refill 06/18/21  1 ML/3 Last office visit 04/01/22 upcoming appointment 07/02/22

## 2022-04-24 ENCOUNTER — Encounter: Payer: Self-pay | Admitting: Family Medicine

## 2022-04-24 DIAGNOSIS — E113299 Type 2 diabetes mellitus with mild nonproliferative diabetic retinopathy without macular edema, unspecified eye: Secondary | ICD-10-CM | POA: Insufficient documentation

## 2022-04-25 DIAGNOSIS — N1831 Chronic kidney disease, stage 3a: Secondary | ICD-10-CM | POA: Diagnosis not present

## 2022-04-25 DIAGNOSIS — N189 Chronic kidney disease, unspecified: Secondary | ICD-10-CM | POA: Diagnosis not present

## 2022-04-29 DIAGNOSIS — Z6841 Body Mass Index (BMI) 40.0 and over, adult: Secondary | ICD-10-CM | POA: Diagnosis not present

## 2022-04-29 DIAGNOSIS — G4733 Obstructive sleep apnea (adult) (pediatric): Secondary | ICD-10-CM | POA: Diagnosis not present

## 2022-04-29 DIAGNOSIS — E1122 Type 2 diabetes mellitus with diabetic chronic kidney disease: Secondary | ICD-10-CM | POA: Diagnosis not present

## 2022-04-29 DIAGNOSIS — D351 Benign neoplasm of parathyroid gland: Secondary | ICD-10-CM | POA: Diagnosis not present

## 2022-04-29 DIAGNOSIS — M109 Gout, unspecified: Secondary | ICD-10-CM | POA: Diagnosis not present

## 2022-04-29 DIAGNOSIS — I129 Hypertensive chronic kidney disease with stage 1 through stage 4 chronic kidney disease, or unspecified chronic kidney disease: Secondary | ICD-10-CM | POA: Diagnosis not present

## 2022-04-29 DIAGNOSIS — D631 Anemia in chronic kidney disease: Secondary | ICD-10-CM | POA: Diagnosis not present

## 2022-04-29 DIAGNOSIS — N2581 Secondary hyperparathyroidism of renal origin: Secondary | ICD-10-CM | POA: Diagnosis not present

## 2022-04-29 DIAGNOSIS — N1831 Chronic kidney disease, stage 3a: Secondary | ICD-10-CM | POA: Diagnosis not present

## 2022-05-01 ENCOUNTER — Telehealth: Payer: Self-pay | Admitting: Family Medicine

## 2022-05-01 NOTE — Telephone Encounter (Signed)
Spoke with Erskine Squibb of [employer] concerning CPAP order. Explained we are PCP and pt is followed by pulmonology (Dr. Coralyn Helling) for OSA. Verbalizes understanding and will document that in pt's notes.

## 2022-05-01 NOTE — Telephone Encounter (Signed)
Bearl from [employer] called asking for status of cpap supply fax that was sent to our office? Bearl stated the ppw needed to be signed by Dr. Reece Agar. Call back # [phone]

## 2022-05-02 ENCOUNTER — Other Ambulatory Visit: Payer: Self-pay | Admitting: Cardiovascular Disease

## 2022-05-09 ENCOUNTER — Telehealth: Payer: Self-pay

## 2022-05-09 NOTE — Progress Notes (Signed)
Care Management & Coordination Services Pharmacy Team  Reason for Encounter: Appointment Reminder  Contacted patient to confirm telephone appointment with Al Corpus, PharmD on 05/15/2022 at 3:00.  Unsuccessful outreach. Left voicemail for patient to return call.  Star Rating Drugs:  Medication:                Last Fill:         Day Supply Novolin N  03/06/22            50 Novolin R  04/23/22 30 Lovastatin  03/27/22            90 Ozempic 0.5 MG PAP Losartan 25 MG 01/23/22      90  Care Gaps: Annual wellness visit in last year? Yes 06/26/2021  If Diabetic: Last eye exam / retinopathy screening: Overdue Last diabetic foot exam: Up to date  Al Corpus, PharmD notified  Claudina Lick, [place] Clinical Pharmacy Assistant [phone]

## 2022-05-14 NOTE — Telephone Encounter (Signed)
Patient wife called in and stated that they need to change the time for this appointment tomorrow. Thank you!

## 2022-05-15 ENCOUNTER — Encounter: Payer: Medicare Other | Admitting: Pharmacist

## 2022-05-15 ENCOUNTER — Other Ambulatory Visit: Payer: Self-pay | Admitting: Internal Medicine

## 2022-05-15 NOTE — Telephone Encounter (Cosign Needed)
Patient has been rescheduled for May 21, 2022 at 10:00.   Al Corpus, PharmD notified  Claudina Lick, [place] Clinical Pharmacy Assistant [phone]

## 2022-05-21 ENCOUNTER — Telehealth: Payer: Self-pay | Admitting: Pharmacist

## 2022-05-21 ENCOUNTER — Encounter: Payer: Medicare Other | Admitting: Pharmacist

## 2022-05-21 NOTE — Telephone Encounter (Signed)
Care Management & Coordination Services Outreach Note  05/21/2022 Name: Donald [name] MRN: [id number] DOB: 1945/02/15  Referred by: [name], MD  Patient had a phone appointment scheduled with clinical pharmacist today.  An unsuccessful telephone outreach was attempted today. The patient was referred to the pharmacist for assistance with medications, care management and care coordination.   Patient will NOT be penalized in any way for missing a Care Management & Coordination Services appointment. The no-show fee does not apply.  If possible, a message was left to return call to: [phone] or to [facility].  Al Corpus, PharmD, BCACP Clinical Pharmacist [place] [facility] at [facility] [phone]

## 2022-05-21 NOTE — Progress Notes (Unsigned)
Care Management & Coordination Services Pharmacy Note  05/21/2022 Name:  Donald [name] MRN:  [id number] DOB:  12/07/1945  Summary: F/U visit -DM: A1c 7.9% (12/2021); pt started Ozempic 0.25 mg ~4 weeks ago and denies side effects; he reports 4 lb weight loss; he reports fasting BG 100-130 with 1 episode of 63, he thinks he took too much insulin the night before; he plans to increase Ozempic to 0.5 mg in 2 more weeks per instructions from endo -Pt is not wearing CGM, he did just receive his new supply of Freestyle Libre so he will restart today -HTN/HF: pt reports home BP 130/60, he denies further episode of low BP (was 120/40 in office last month)  Recommendations/Changes made from today's visit: -Counseled on hypoglycemia prevention/treatment; discussed Ozempic insulin-sparing effect -Advised he may have to further reduce insulin when increasing Ozempic  Follow up plan: -Pharmacist follow up televisit scheduled for 1 month -Cardiology appt 06/24/22; PCP appt 07/02/22; Endocrine appt 09/13/22    Subjective: Donald [name] is an 77 y.o. year old male who is a primary patient of [name], MD.  The care coordination team was consulted for assistance with disease management and care coordination needs.    Engaged with patient by telephone for follow up visit.  Recent office visits: 04/29/22 PA Ejigiri (Nephrology): CKD - Start Farxiga 10 mg  04/01/22 Dr Sharen Hones OV: A1c 7.4%; BP 120/40 - monitor at home; no med changes. RTC 3 months  12/31/21 Dr Sharen Hones OV: f/u - A1c 7.9%. GFR down 34. Request urine sample. DM - establishing new endo 03/2022. Asked to price out Trulicity - drop Novolin R by 50 units each dose and NPH to 200 units daily.  Recent consult visits: 04/02/22 Dr Lonzo Cloud (Endocrine): TOC - update insulin: Novolin N 140-170 U HS; Novolin R 140 units before meals  12/17/21 Dr [name] (Cardiology): f/u - pt off Eliquis d/t cost. Taking aspirin. Rx'd NTG.   10/10/21  Dr Craige Cotta (Pulmonary): OSA - renewed CPAP   05/14/21 Dr [name] (Cardiology): f/u - Increased lovastatin to 80 mg.   05/02/21 Dr Everardo All (Endocrine): f/u DM - A1c 7.3%. Reduce NPH to 220 u HS. F/U 3 months.  Hospital visits: None in previous 6 months   Objective:  Lab Results  Component Value Date   CREATININE 1.88 (H) 12/31/2021   BUN 42 (H) 12/31/2021   GFR 34.88 (L) 12/31/2021   GFRNONAA 46 (L) 11/02/2020   GFRAA 52 (L) 10/19/2018   NA 143 12/31/2021   K 4.4 12/31/2021   CALCIUM 9.3 12/31/2021   CO2 31 12/31/2021   GLUCOSE 87 12/31/2021    Lab Results  Component Value Date/Time   HGBA1C 7.4 (A) 04/01/2022 10:30 AM   HGBA1C 7.9 (A) 12/31/2021 10:42 AM   HGBA1C 7.5 (H) 06/20/2021 09:07 AM   HGBA1C 7.0 (H) 10/30/2020 02:13 PM   FRUCTOSAMINE 319 (H) 10/21/2017 04:54 PM   FRUCTOSAMINE 296 (H) 03/11/2011 10:48 AM   GFR 34.88 (L) 12/31/2021 11:03 AM   GFR 47.82 (L) 06/20/2021 09:07 AM   MICROALBUR 6.3 (H) 01/02/2022 03:13 PM   MICROALBUR 12.0 (H) 03/02/2015 11:42 AM    Last diabetic Eye exam:  Lab Results  Component Value Date/Time   HMDIABEYEEXA Retinopathy (A) 05/21/2017 03:54 PM    Last diabetic Foot exam: No results found for: "HMDIABFOOTEX"   Lab Results  Component Value Date   CHOL 105 06/20/2021   HDL 36.30 (L) 06/20/2021   LDLCALC 48 06/20/2021   LDLDIRECT 52.2 05/25/2009  TRIG 104.0 06/20/2021   CHOLHDL 3 06/20/2021       Latest Ref Rng & Units 12/31/2021   11:03 AM 06/20/2021    9:07 AM 11/15/2020   11:57 AM  Hepatic Function  Total Protein 6.0 - 8.3 g/dL  6.1    Albumin 3.5 - 5.2 g/dL 4.3  4.1  3.9   AST 0 - 37 U/L  14    ALT 0 - 53 U/L  14    Alk Phosphatase 39 - 117 U/L  91    Total Bilirubin 0.2 - 1.2 mg/dL  1.3      Lab Results  Component Value Date/Time   TSH 2.156 10/31/2020 01:55 PM   TSH 2.448 10/30/2020 02:13 PM   TSH 3.01 03/09/2020 02:12 PM   TSH 3.14 10/21/2017 04:54 PM   FREET4 1.04 10/30/2020 02:13 PM       Latest Ref Rng &  Units 12/31/2021   11:03 AM 11/02/2020    2:51 AM 11/01/2020    7:02 AM  CBC  WBC 4.0 - 10.5 K/uL 9.7  7.5  6.2   Hemoglobin 13.0 - 17.0 g/dL 81.1  91.4  78.2   Hematocrit 39.0 - 52.0 % 40.5  41.9  43.3   Platelets 150.0 - 400.0 K/uL 190.0  195  177     Lab Results  Component Value Date/Time   VD25OH 55.84 06/20/2021 09:07 AM   VD25OH 32.87 06/19/2020 08:24 AM   VITAMINB12 560 06/20/2021 09:07 AM   VITAMINB12 >1550 (H) 06/19/2020 08:24 AM    Clinical ASCVD: Yes  The ASCVD Risk score (Arnett DK, et al., 2019) failed to calculate for the following reasons:   The patient has a prior MI or stroke diagnosis    CHA2DS2/VAS Stroke Risk Points  Current as of 2 days ago (Monday)     7 >= 2 Points: High Risk  1 - 1.99 Points: Medium Risk  0 Points: Low Risk       04/01/2022   10:27 AM 06/26/2021   12:52 PM 06/21/2020    2:46 PM  Depression screen PHQ 2/9  Decreased Interest 0 3 0  Down, Depressed, Hopeless 0 0 0  PHQ - 2 Score 0 3 0  Altered sleeping 0 0 0  Tired, decreased energy 1 1 0  Change in appetite 0 0 0  Feeling bad or failure about yourself  0 0 0  Trouble concentrating 0 0 0  Moving slowly or fidgety/restless 0 0 0  Suicidal thoughts 0 0 0  PHQ-9 Score 1 4 0  Difficult doing work/chores Not difficult at all Not difficult at all      Social History   Tobacco Use  Smoking Status Never  Smokeless Tobacco Never   BP Readings from Last 3 Encounters:  04/02/22 120/80  04/01/22 (!) 120/40  12/31/21 130/64   Pulse Readings from Last 3 Encounters:  04/02/22 71  04/01/22 60  12/31/21 (!) 55   Wt Readings from Last 3 Encounters:  04/02/22 261 lb (118.4 kg)  04/01/22 264 lb 6 oz (119.9 kg)  12/31/21 255 lb 3.2 oz (115.8 kg)   BMI Readings from Last 3 Encounters:  04/02/22 42.13 kg/m  04/01/22 42.67 kg/m  12/31/21 41.19 kg/m    Allergies  Allergen Reactions   Codeine Other (See Comments)    REACTION: chest pain   Pioglitazone Other (See Comments)     REACTION to Actos: swelling in ankles    Medications Reviewed Today  Reviewed by Kathyrn Sheriff, RPH (Pharmacist) on 04/17/22 at 1605  Med List Status: <None>   Medication Order Taking? Sig Documenting Provider Last Dose Status Informant  albuterol (VENTOLIN HFA) 108 (90 Base) MCG/ACT inhaler [id number] Yes Inhale 2 puffs into the lungs every 6 (six) hours as needed for wheezing or shortness of breath. [name], MD Taking Active Self  allopurinol (ZYLOPRIM) 300 MG tablet [id number] Yes TAKE ONE TABLET BY MOUTH EVERY DAY [name], MD Taking Active   ALPRAZolam Prudy Feeler) 1 MG tablet [id number] Yes TAKE 1 TABLET BY MOUTH AT BEDTIME IF NEEDED FOR SLEEP. [name], MD Taking Active   aspirin 81 MG EC tablet [id number] Yes Take 81 mg by mouth daily. Swallow whole. [provider] Taking Active   Cholecalciferol (VITAMIN D3) 25 MCG (1000 UT) CAPS [id number] Yes Take 1 capsule (1,000 Units total) by mouth daily. [name], MD Taking Active Self  colchicine 0.6 MG tablet [id number] Yes TAKE 1 TABLET BY MOUTH DAILY AS NEEDED FOR GOUT GLARE. ON FIRST DAY OF GOUT FLARE,MAY TAKE 2 TABLETS AT ONCE AS DIRECTED [name], MD Taking Active   Continuous Blood Gluc Sensor (FREESTYLE LIBRE 2 SENSOR) [place] [id number] Yes 1 Device by Does not apply route every 14 (fourteen) days. Shamleffer, Konrad Dolores, MD Taking Active   cyanocobalamin (,VITAMIN B-12,) 1000 MCG/ML injection [id number] Yes INJECT INTRAMUSCULARLY EVERY 30 DAYS [name], MD Taking Active   cyclobenzaprine (FLEXERIL) 10 MG tablet [id number] Yes Take 10 mg by mouth 3 (three) times daily as needed for muscle spasms. [provider] Taking Active Self  doxazosin (CARDURA) 1 MG tablet [id number] Yes TAKE ONE TABLET BY MOUTH TWICE DAILY Gollan, Tollie Pizza, MD Taking Active   fluticasone (FLONASE) 50 MCG/ACT nasal spray [id number] Yes Place 2 sprays into both nostrils daily. Emi Belfast, FNP Taking Active Self  furosemide (LASIX) 80 MG tablet [id number] Yes TAKE ONE TABLET BY MOUTH EVERY DAY *MAY TAKE AN EXTRA PILL FOR SWELLING* Gollan, Tollie Pizza, MD Taking Active   gabapentin (NEURONTIN) 600 MG tablet [id number] Yes TAKE TWO TABLETS AT BEDTIME [name], MD Taking Active   glucose blood (GE100 BLOOD GLUCOSE TEST) test strip [id number] Yes TEST FOUR TIMES DAILY Shamleffer, Konrad Dolores, MD Taking Active   HYDROcodone-acetaminophen [facility]) 10-325 MG tablet [id number] Yes Take 1 tablet by mouth every 6 (six) hours as needed for moderate pain ((typically once to twice daily if needed)).  [name], MD Taking Active Self  insulin NPH Human (NOVOLIN N) 100 UNIT/ML injection [id number] Yes Inject 2 mLs (200 Units total) into the skin at bedtime.  Patient taking differently: Inject 140-170 Units into the skin at bedtime.   Shamleffer, Konrad Dolores, MD Taking Active            Med Note Maida Sale Apr 17, 2022  4:04 PM)    isosorbide mononitrate (IMDUR) 30 MG 24 hr tablet [id number] Yes TAKE ONE TABLET BY MOUTH DAILY [name] Tollie Pizza, MD Taking Active   losartan (COZAAR) 25 MG tablet [id number] Yes TAKE ONE TABLET BY MOUTH AT BEDTIME Antonieta Iba, MD Taking Active   lovastatin (MEVACOR) 40 MG tablet [id number] Yes TAKE TWO TABLETS BY MOUTH AT BEDTIME Antonieta Iba, MD Taking Active   meclizine (ANTIVERT) 25 MG tablet [id number] Yes Take 25 mg by mouth 3 (three) times daily as needed for dizziness. [provider] Taking Active   Naphazoline-Pheniramine (OPCON-A OP) [id number] Yes Place 1 drop into both  eyes 3 (three) times daily as needed (itchy eyes.).  [provider] Taking Active Self  nitroGLYCERIN (NITROSTAT) 0.4 MG SL tablet [id number] Yes Place 1 tablet (0.4 mg total) under the tongue every 5 (five) minutes as needed for chest pain. Antonieta Iba, MD Taking Active   NOVOLIN R 100 UNIT/ML injection [id number] Yes INJECT 200  UNITS IN THE AM, 140 UNITS IN THE AFTERNOON, AND 280 UNITS IN THE EVENING - USE BEFORE A MEAL  Patient taking differently: Inject 140 Units into the skin 2 (two) times daily before a meal. INJECT 200 UNITS IN THE AM, 140 UNITS IN THE AFTERNOON, AND 280 UNITS IN THE EVENING - USE BEFORE A MEAL   Shamleffer, Konrad Dolores, MD Taking Active   ondansetron (ZOFRAN) 4 MG tablet [id number] Yes Take 1 tablet (4 mg total) by mouth 3 (three) times daily as needed for nausea. [name], MD Taking Active   Potassium Citrate 15 MEQ (1620 MG) TBCR [id number] Yes Take 1 tablet by mouth 3 (three) times daily. [provider] Taking Active   Semaglutide,0.25 or 0.5MG /DOS, (OZEMPIC, 0.25 OR 0.5 MG/DOSE,) 2 MG/3ML SOPN [id number] Yes 0.5 mg by Other route once a week. Inject 0.25 mg weekly x 4 weeks, then increase to 0.5 mg weekly  Patient taking differently: Inject 0.5 mg into the skin once a week. Inject 0.25 mg weekly x 4 weeks, then increase to 0.5 mg weekly   Shamleffer, Konrad Dolores, MD Taking Active   Sennosides-Docusate Sodium (STOOL SOFTENER & LAXATIVE PO) [id number] Yes Take 2 tablets by mouth at bedtime. [provider] Taking Active Self  Syringe/Needle, Disp, (SYRINGE 3CC/22GX1-1/2") 22G X 1-1/2" 3 ML MISC [id number] Yes Use to administer monthly b12 shots [name], MD Taking Active Self  Med List Note Cardell Peach 10/12/18 1538): cpap machine at bedtime            SDOH:  (Social Determinants of Health) assessments and interventions performed: Yes SDOH Interventions    Flowsheet Row Care Coordination from 02/06/2022 in [facility] Care Coordination from 11/27/2021 in [facility] Care Coordination Chronic Care Management from 07/31/2020 in [facility] Chronic Care Management from 03/10/2020 in [facility] Clinical Support from 04/20/2019 in [facility] [place]  SDOH Interventions       Food Insecurity Interventions -- Intervention Not Indicated -- -- --  Housing Interventions -- Intervention Not Indicated -- -- --  Transportation Interventions -- Intervention Not Indicated -- -- --  Depression Interventions/Treatment  -- -- -- -- PHQ2-9 Score <4 Follow-up Not Indicated  Financial Strain Interventions Other (Comment)  [Brand name meds unaffordable - can pursue PAP] -- Other (Comment)  [Restart PAP for insulins] --  [Apply for insulin assistance] --       Medication Assistance:  [employer] Cares PAP 2024  Medication Access: Within the past 30 days, how often has patient missed a dose of medication? 0 Is a pillbox or other method used to improve adherence? Yes  Factors that may affect medication adherence? financial need Are meds synced by current pharmacy? No  Are meds delivered by current pharmacy? No  Does patient experience delays in picking up medications due to transportation concerns? No   [facility] Reviewed: Is patient disadvantaged to use [facility]?: Yes  Current Rx insurance plan: [employer] PDP Name and location of Current pharmacy:  [facility] - [place],  [place] - [address] [place] [address] Phone: [phone] Fax: [phone]  [facility] [address], [place] - [address] [place] [address] Phone: [phone] Fax: [phone]  [facility] services reviewed with patient today?: No  Patient requests to transfer care to [facility]?: No  Reason patient declined to change pharmacies: Disadvantaged due to insurance/mail order  Compliance/Adherence/Medication fill history: Care Gaps: Foot exam (due 06/2021) Eye exam (due 05/2018) - faxed request to [facility] for records 04/17/22  Star-Rating Drugs: Losartan - PDC 96% Lovastatin - PDC 99%   ASSESSMENT / PLAN   Hypertension / Heart Failure  (BP goal <140/90) -Controlled - per home readings; recently there was concern for low BP in office (120/40) however home readings are within goal -Current home readings: 130/60 -Daily weights: 250, 251 -Denies hypotensive/hypertensive symptoms; Denies any falls  -Last ejection fraction: 45-50% (Date: 10/2020) -HF type: HFmrEF (mildly reduced EF 41-49%) -NYHA Class: I-II; AHA HF Stage: C (Heart disease and symptoms present) -Current treatment: Losartan 25 mg daily HS - Appropriate, Effective, Safe, Accessible Furosemide 80 mg daily + extra PRN -Appropriate, Effective, Safe, Accessible Doxazosin 1 mg BID -Appropriate, Effective, Safe, Accessible Isosorbide MN 30 mg daily -Appropriate, Effective, Safe, Accessible Farxiga 10 mg daily -Medications previously tried: amlodipine - stopped per Cardio 04/2020 -Counseled to monitor BP at home weekly, document, and provide log at future appointments -Recommended to continue current medication  Diabetes (A1c goal <7%) -Not ideally controlled - A1c 7.9% (12/2021); pt recently started Ozempic 03/14/22 and reports no side effects; he has lost about 4 lbs -Has steroid injection every 3-4 months. -Follows with endocrine (Dr Lonzo Cloud), est 03/2022 -Fasting BG: 100, 126, 63 Pt not currently wearing CGM, he had issues getting it from DME but finally received it yesterday;  Reviewed AGP report: 05/08/22 to 05/21/22. Sensor active: 82%  Time in range (70-180): 48% (goal > 70%)  High (180-250): 31%  Very high (>250): 20%  Low (< 70): 1% (goal < 4%)  GMI: 7.9%; Average glucose: 190  Previous AGP report: 02/13/22 to 02/26/22. Sensor active: 84%  Time in range (70-180): 51% (goal > 70%)  High (180-250): 34%  Very high (>250): 13%  Low (< 70): 2% (goal < 4%)  GMI: 7.6%; Average glucose: 179  -Current medications: Novolin N - 140-170 units HS- Appropriate, Query Effective Novolin R - 140 units BID AC -Appropriate, Query Effective Ozempic 0.25 mg weekly (PAP) -  Appropriate, Query Effective Farxiga 10 mg daily  Freestyle Libre 2 ([facility]) - Appropriate, Effective, Safe, Accessible -Medications previously tried: Merchandiser, retail (cost) -Reviewed insulin-sparing effect of Ozempic, discussed risk for hypoglycemia and monitoring glucose closely to prevent low glucose -Recommend to continue current medication   Atrial Fibrillation (Goal: prevent stroke and major bleeding) -Not ideally controlled - off anticoagulation due to cost issues -CHADSVASC: 7 -Pt follows with cardiology (Dr [name]), pt deferred anticoagulation at most recent appt 12/2021 -Current treatment: Aspirin 81 mg daily - Query appropriate -Medications previously tried: Eliquis (cost) -Consider warfarin or Xarelto With Me program in future  Hyperlipidemia / CAD (LDL goal < 70) -Controlled- LDL 48 (06/2021) at goal -Hx NSTEMI 01/2021; hx PAD -Current treatment: Lovastatin 40 mg HS - Appropriate, Effective, Safe, Accessible Nitroglycerin 0.4 mg SL PRN -Appropriate, Effective, Safe, Accessible Isosorbide MN 30 mg daily -Appropriate, Effective, Safe, Accessible -Medications previously tried: n/a  -Educated on Cholesterol goals;  -Recommended to continue current medication  Chronic Kidney Disease Stage 3a  -  All medications assessed for renal dosing and appropriateness in chronic kidney disease. -Follows with Nephrology ([facility]) -Recommended to continue current medication  Back pain (Goal: manage pain) -Controlled -Follows with PM&R (Dr Ethelene Hal) -Current treatment  Hydrocodone-APAP 7.5-325 mg #120/month - Appropriate, Effective, Safe, Accessible -Medications previously tried: n/a  -Recommended to continue current medication   Al Corpus, PharmD, BCACP Clinical Pharmacist [facility] [phone]

## 2022-06-07 ENCOUNTER — Telehealth: Payer: Self-pay | Admitting: Family Medicine

## 2022-06-07 NOTE — Telephone Encounter (Signed)
Patient called in stating that he just put on his last freestyle libre sensor today,and would like for Mardella Layman to call [employer] center to have them ship him some more.

## 2022-06-11 DIAGNOSIS — G894 Chronic pain syndrome: Secondary | ICD-10-CM | POA: Diagnosis not present

## 2022-06-11 DIAGNOSIS — M5136 Other intervertebral disc degeneration, lumbar region: Secondary | ICD-10-CM | POA: Diagnosis not present

## 2022-06-11 DIAGNOSIS — M5416 Radiculopathy, lumbar region: Secondary | ICD-10-CM | POA: Diagnosis not present

## 2022-06-11 DIAGNOSIS — Z79891 Long term (current) use of opiate analgesic: Secondary | ICD-10-CM | POA: Diagnosis not present

## 2022-06-11 NOTE — Telephone Encounter (Cosign Needed)
Spoke with [facility]. Last delivery was on 04/08/2022 of 7 units = 90ds. Patient is not due for next delivery until 07/07/2022 in which patient will get 6 units. Patient should not be in need of any sensors at this time. [facility] stated patient will need to call Freestyle manufacturer if he has any defective sensors and they will replace them. Phone number for patient to call is [phone].   Called patient; no answer; left message.  Al Corpus, PharmD notified  Claudina Lick, [place] Clinical Pharmacy Assistant [phone]

## 2022-06-12 DIAGNOSIS — D2261 Melanocytic nevi of right upper limb, including shoulder: Secondary | ICD-10-CM | POA: Diagnosis not present

## 2022-06-12 DIAGNOSIS — D2271 Melanocytic nevi of right lower limb, including hip: Secondary | ICD-10-CM | POA: Diagnosis not present

## 2022-06-12 DIAGNOSIS — D2262 Melanocytic nevi of left upper limb, including shoulder: Secondary | ICD-10-CM | POA: Diagnosis not present

## 2022-06-12 DIAGNOSIS — D2272 Melanocytic nevi of left lower limb, including hip: Secondary | ICD-10-CM | POA: Diagnosis not present

## 2022-06-12 DIAGNOSIS — L821 Other seborrheic keratosis: Secondary | ICD-10-CM | POA: Diagnosis not present

## 2022-06-12 DIAGNOSIS — D225 Melanocytic nevi of trunk: Secondary | ICD-10-CM | POA: Diagnosis not present

## 2022-06-12 DIAGNOSIS — L570 Actinic keratosis: Secondary | ICD-10-CM | POA: Diagnosis not present

## 2022-06-12 DIAGNOSIS — B372 Candidiasis of skin and nail: Secondary | ICD-10-CM | POA: Diagnosis not present

## 2022-06-12 NOTE — Telephone Encounter (Signed)
Scheduled phone call 5/30 @ 9am.

## 2022-06-12 NOTE — Telephone Encounter (Signed)
Patient contacted the office regarding a returned call from Lillia Abed, says he got her message about the sensors for the freestyle libre. States he is going to order some more, also had questions about his ozempic dosage. Patient would like a call to discuss this whenever possible. Please advise [phone], thank you.

## 2022-06-13 ENCOUNTER — Ambulatory Visit: Payer: Medicare Other | Admitting: Pharmacist

## 2022-06-13 ENCOUNTER — Ambulatory Visit (INDEPENDENT_AMBULATORY_CARE_PROVIDER_SITE_OTHER): Payer: Medicare Other | Admitting: Podiatry

## 2022-06-13 ENCOUNTER — Encounter: Payer: Self-pay | Admitting: Podiatry

## 2022-06-13 ENCOUNTER — Other Ambulatory Visit: Payer: Self-pay | Admitting: Cardiovascular Disease

## 2022-06-13 VITALS — BP 163/56 | HR 58

## 2022-06-13 DIAGNOSIS — M778 Other enthesopathies, not elsewhere classified: Secondary | ICD-10-CM | POA: Diagnosis not present

## 2022-06-13 NOTE — Progress Notes (Signed)
Care Management & Coordination Services Pharmacy Note  06/13/2022 Name:  Donald [name] MRN:  [id number] DOB:  04/12/1945  Summary: F/U visit -DM: A1c 7.4% (03/2022); pt started Ozempic in March and reports improvement in glucose and weight; of note his nephrologist prescribed Marcelline Deist recently but pt did not start it due to high cost, he does have a 10-day sample on hand. Reviewed multiple benefits of Farxiga including DM, CKD, and HF -Reviewed AGP report: 05/26/22 to 06/08/22. Sensor active: 77%  Time in range (70-180): 56% (goal > 70%)  High (180-250): 27%  Very high (>250): 15%  Low (< 70): 2% (goal < 4%)  GMI: 7.5%; Average glucose: 174 -HTN/HF: pt reports home BP 130/60, he denies further episode of low BP  Recommendations/Changes made from today's visit: -Advised to try Farxiga sample; if tolerated we will apply for PAP or [employer] grant -Advised to call [employer] for sensor replacements  Follow up plan: -Pharmacist follow up televisit scheduled for 1 month -Cardiology appt 06/24/22; PCP appt 07/02/22; Endocrine appt 09/13/22    Subjective: Donald [name] is an 77 y.o. year old male who is a primary patient of [name], MD.  The care coordination team was consulted for assistance with disease management and care coordination needs.    Engaged with patient by telephone for follow up visit.  Recent office visits: 04/01/22 Dr Sharen Hones OV: A1c 7.4%; BP 120/40 - monitor at home; no med changes. RTC 3 months  12/31/21 Dr Sharen Hones OV: f/u - A1c 7.9%. GFR down 34. Request urine sample. DM - establishing new endo 03/2022. Asked to price out Trulicity - drop Novolin R by 50 units each dose and NPH to 200 units daily.  Recent consult visits: 04/29/22 PA Ejigiri (Nephrology): CKD - Start Farxiga 10 mg  04/02/22 Dr Lonzo Cloud (Endocrine): TOC - update insulin: Novolin N 140-170 U HS; Novolin R 140 units before meals  12/17/21 Dr [name] (Cardiology): f/u - pt off Eliquis d/t  cost. Taking aspirin. Rx'd NTG.   10/10/21 Dr Craige Cotta (Pulmonary): OSA - renewed CPAP   05/14/21 Dr [name] (Cardiology): f/u - Increased lovastatin to 80 mg.   05/02/21 Dr Everardo All (Endocrine): f/u DM - A1c 7.3%. Reduce NPH to 220 u HS. F/U 3 months.  Hospital visits: None in previous 6 months   Objective:  Lab Results  Component Value Date   CREATININE 1.88 (H) 12/31/2021   BUN 42 (H) 12/31/2021   GFR 34.88 (L) 12/31/2021   GFRNONAA 46 (L) 11/02/2020   GFRAA 52 (L) 10/19/2018   NA 143 12/31/2021   K 4.4 12/31/2021   CALCIUM 9.3 12/31/2021   CO2 31 12/31/2021   GLUCOSE 87 12/31/2021    Lab Results  Component Value Date/Time   HGBA1C 7.4 (A) 04/01/2022 10:30 AM   HGBA1C 7.9 (A) 12/31/2021 10:42 AM   HGBA1C 7.5 (H) 06/20/2021 09:07 AM   HGBA1C 7.0 (H) 10/30/2020 02:13 PM   FRUCTOSAMINE 319 (H) 10/21/2017 04:54 PM   FRUCTOSAMINE 296 (H) 03/11/2011 10:48 AM   GFR 34.88 (L) 12/31/2021 11:03 AM   GFR 47.82 (L) 06/20/2021 09:07 AM   MICROALBUR 6.3 (H) 01/02/2022 03:13 PM   MICROALBUR 12.0 (H) 03/02/2015 11:42 AM    Last diabetic Eye exam:  Lab Results  Component Value Date/Time   HMDIABEYEEXA Retinopathy (A) 05/21/2017 03:54 PM    Last diabetic Foot exam: No results found for: "HMDIABFOOTEX"   Lab Results  Component Value Date   CHOL 105 06/20/2021   HDL 36.30 (L)  06/20/2021   LDLCALC 48 06/20/2021   LDLDIRECT 52.2 05/25/2009   TRIG 104.0 06/20/2021   CHOLHDL 3 06/20/2021       Latest Ref Rng & Units 12/31/2021   11:03 AM 06/20/2021    9:07 AM 11/15/2020   11:57 AM  Hepatic Function  Total Protein 6.0 - 8.3 g/dL  6.1    Albumin 3.5 - 5.2 g/dL 4.3  4.1  3.9   AST 0 - 37 U/L  14    ALT 0 - 53 U/L  14    Alk Phosphatase 39 - 117 U/L  91    Total Bilirubin 0.2 - 1.2 mg/dL  1.3      Lab Results  Component Value Date/Time   TSH 2.156 10/31/2020 01:55 PM   TSH 2.448 10/30/2020 02:13 PM   TSH 3.01 03/09/2020 02:12 PM   TSH 3.14 10/21/2017 04:54 PM   FREET4 1.04  10/30/2020 02:13 PM       Latest Ref Rng & Units 12/31/2021   11:03 AM 11/02/2020    2:51 AM 11/01/2020    7:02 AM  CBC  WBC 4.0 - 10.5 K/uL 9.7  7.5  6.2   Hemoglobin 13.0 - 17.0 g/dL 16.1  09.6  04.5   Hematocrit 39.0 - 52.0 % 40.5  41.9  43.3   Platelets 150.0 - 400.0 K/uL 190.0  195  177     Lab Results  Component Value Date/Time   VD25OH 55.84 06/20/2021 09:07 AM   VD25OH 32.87 06/19/2020 08:24 AM   VITAMINB12 560 06/20/2021 09:07 AM   VITAMINB12 >1550 (H) 06/19/2020 08:24 AM    Clinical ASCVD: Yes  The ASCVD Risk score (Arnett DK, et al., 2019) failed to calculate for the following reasons:   The patient has a prior MI or stroke diagnosis    CHA2DS2/VAS Stroke Risk Points  Current as of 2 days ago (Monday)     7 >= 2 Points: High Risk  1 - 1.99 Points: Medium Risk  0 Points: Low Risk       04/01/2022   10:27 AM 06/26/2021   12:52 PM 06/21/2020    2:46 PM  Depression screen PHQ 2/9  Decreased Interest 0 3 0  Down, Depressed, Hopeless 0 0 0  PHQ - 2 Score 0 3 0  Altered sleeping 0 0 0  Tired, decreased energy 1 1 0  Change in appetite 0 0 0  Feeling bad or failure about yourself  0 0 0  Trouble concentrating 0 0 0  Moving slowly or fidgety/restless 0 0 0  Suicidal thoughts 0 0 0  PHQ-9 Score 1 4 0  Difficult doing work/chores Not difficult at all Not difficult at all      Social History   Tobacco Use  Smoking Status Never  Smokeless Tobacco Never   BP Readings from Last 3 Encounters:  04/02/22 120/80  04/01/22 (!) 120/40  12/31/21 130/64   Pulse Readings from Last 3 Encounters:  04/02/22 71  04/01/22 60  12/31/21 (!) 55   Wt Readings from Last 3 Encounters:  04/02/22 261 lb (118.4 kg)  04/01/22 264 lb 6 oz (119.9 kg)  12/31/21 255 lb 3.2 oz (115.8 kg)   BMI Readings from Last 3 Encounters:  04/02/22 42.13 kg/m  04/01/22 42.67 kg/m  12/31/21 41.19 kg/m    Allergies  Allergen Reactions   Codeine Other (See Comments)    REACTION:  chest pain   Pioglitazone Other (See Comments)    REACTION  to Actos: swelling in ankles    Medications Reviewed Today     Reviewed by Kathyrn Sheriff, [facility] (Pharmacist) on 06/13/22 at 0940  Med List Status: <None>   Medication Order Taking? Sig Documenting Provider Last Dose Status Informant  albuterol (VENTOLIN HFA) 108 (90 Base) MCG/ACT inhaler [id number] Yes Inhale 2 puffs into the lungs every 6 (six) hours as needed for wheezing or shortness of breath. [name], MD Taking Active Self  allopurinol (ZYLOPRIM) 300 MG tablet [id number] Yes TAKE ONE TABLET BY MOUTH EVERY DAY [name], MD Taking Active   ALPRAZolam Prudy Feeler) 1 MG tablet [id number] Yes TAKE 1 TABLET BY MOUTH AT BEDTIME IF NEEDED FOR SLEEP. [name], MD Taking Active   aspirin 81 MG EC tablet [id number] Yes Take 81 mg by mouth daily. Swallow whole. [provider] Taking Active   Cholecalciferol (VITAMIN D3) 25 MCG (1000 UT) CAPS [id number] Yes Take 1 capsule (1,000 Units total) by mouth daily. [name], MD Taking Active Self  colchicine 0.6 MG tablet [id number] Yes TAKE 1 TABLET BY MOUTH DAILY AS NEEDED FOR GOUT GLARE. ON FIRST DAY OF GOUT FLARE,MAY TAKE 2 TABLETS AT ONCE AS DIRECTED [name], MD Taking Active   Continuous Blood Gluc Sensor (FREESTYLE LIBRE 2 SENSOR) [place] [id number] Yes 1 Device by Does not apply route every 14 (fourteen) days. Shamleffer, Konrad Dolores, MD Taking Active   cyanocobalamin (VITAMIN B12) 1000 MCG/ML injection [id number] Yes INJECT INTRAMUSCULARLY EVERY 30 DAYS [name], MD Taking Active   cyclobenzaprine (FLEXERIL) 10 MG tablet [id number] Yes Take 10 mg by mouth 3 (three) times daily as needed for muscle spasms. [provider] Taking Active Self  doxazosin (CARDURA) 1 MG tablet [id number] Yes TAKE ONE TABLET BY MOUTH TWICE DAILY Gollan, Tollie Pizza, MD Taking Active   FARXIGA 10 MG TABS tablet [id number] No Take 10 mg by mouth daily.   Patient not taking: Reported on 06/13/2022   Tomasa Blase, PA-C Not Taking Active   fluticasone [facility]) 50 MCG/ACT nasal spray [id number] Yes Place 2 sprays into both nostrils daily. Emi Belfast, FNP Taking Active Self  furosemide (LASIX) 80 MG tablet [id number] Yes TAKE 1 TABLET BY MOUTH EVERY DAY. MAY TAKE AN EXTRA PILL FOR SWELLING. Antonieta Iba, MD Taking Active   gabapentin (NEURONTIN) 600 MG tablet [id number] Yes TAKE TWO TABLETS AT BEDTIME [name], MD Taking Active   glucose blood (GE100 BLOOD GLUCOSE TEST) test strip [id number] Yes TEST FOUR TIMES DAILY Shamleffer, Konrad Dolores, MD Taking Active   HYDROcodone-acetaminophen [facility]) 10-325 MG tablet [id number] Yes Take 1 tablet by mouth every 6 (six) hours as needed for moderate pain ((typically once to twice daily if needed)).  [name], MD Taking Active Self  isosorbide mononitrate (IMDUR) 30 MG 24 hr tablet [id number] Yes TAKE ONE TABLET BY MOUTH DAILY [name], Tollie Pizza, MD Taking Active   losartan (COZAAR) 25 MG tablet [id number] Yes TAKE ONE TABLET BY MOUTH AT BEDTIME [name], MD Taking Active   lovastatin (MEVACOR) 40 MG tablet [id number] Yes TAKE TWO TABLETS BY MOUTH AT BEDTIME Antonieta Iba, MD Taking Active   meclizine (ANTIVERT) 25 MG tablet [id number] Yes Take 25 mg by mouth 3 (three) times daily as needed for dizziness. [provider] Taking Active   Naphazoline-Pheniramine (OPCON-A OP) [id number] Yes Place 1 drop into both eyes 3 (three) times daily as needed (itchy eyes.).  [provider] Taking Active Self  nitroGLYCERIN (NITROSTAT) 0.4 MG SL tablet [id number]  Yes Place 1 tablet (0.4 mg total) under the tongue every 5 (five) minutes as needed for chest pain. Antonieta Iba, MD Taking Active   NOVOLIN N 100 UNIT/ML injection [id number] Yes INJECT 200 UNITS TOTAL INTO THE SKIN AT BEDTIME  Patient taking differently: Inject 160-170 Units into the skin at bedtime.    Shamleffer, Konrad Dolores, MD Taking Active   NOVOLIN R 100 UNIT/ML injection [id number] Yes INJECT 200 UNITS IN THE AM, 140 UNITS IN THE AFTERNOON, AND 280 UNITS IN THE EVENING - USE BEFORE A MEAL  Patient taking differently: Inject 140 Units into the skin 2 (two) times daily before a meal. INJECT 200 UNITS IN THE AM, 140 UNITS IN THE AFTERNOON, AND 280 UNITS IN THE EVENING - USE BEFORE A MEAL   Shamleffer, Konrad Dolores, MD Taking Active   ondansetron (ZOFRAN) 4 MG tablet [id number] Yes Take 1 tablet (4 mg total) by mouth 3 (three) times daily as needed for nausea. [name], MD Taking Active   Potassium Citrate 15 MEQ (1620 MG) TBCR [id number] Yes Take 1 tablet by mouth 3 (three) times daily. [provider] Taking Active   Semaglutide,0.25 or 0.5MG /DOS, (OZEMPIC, 0.25 OR 0.5 MG/DOSE,) 2 MG/3ML SOPN [id number] Yes 0.5 mg by Other route once a week. Inject 0.25 mg weekly x 4 weeks, then increase to 0.5 mg weekly  Patient taking differently: Inject 0.5 mg into the skin once a week.   Shamleffer, Konrad Dolores, MD Taking Active   Sennosides-Docusate Sodium (STOOL SOFTENER & LAXATIVE PO) [id number] Yes Take 2 tablets by mouth at bedtime. [provider] Taking Active Self  Syringe/Needle, Disp, (SYRINGE 3CC/22GX1-1/2") 22G X 1-1/2" 3 ML MISC [id number] Yes Use to administer monthly b12 shots [name], MD Taking Active Self  Med List Note Cardell Peach 10/12/18 1538): cpap machine at bedtime            SDOH:  (Social Determinants of Health) assessments and interventions performed: Yes SDOH Interventions    Flowsheet Row Care Coordination from 02/06/2022 in [facility] Care Coordination from 11/27/2021 in [facility] Coordination Chronic Care Management from 07/31/2020 in [facility] Chronic Care Management from 03/10/2020 in [facility]  Clinical Support from 04/20/2019 in [facility] [place]  SDOH Interventions       Food Insecurity Interventions -- Intervention Not Indicated -- -- --  Housing Interventions -- Intervention Not Indicated -- -- --  Transportation Interventions -- Intervention Not Indicated -- -- --  Depression Interventions/Treatment  -- -- -- -- PHQ2-9 Score <4 Follow-up Not Indicated  Financial Strain Interventions Other (Comment)  [Brand name meds unaffordable - can pursue PAP] -- Other (Comment)  [Restart PAP for insulins] --  [Apply for insulin assistance] --       Medication Assistance:  [employer] C[employer] PAP 2024  Medication Access: Within the past 30 days, how often has patient missed a dose of medication? 0 Is a pillbox or other method used to improve adherence? Yes  Factors that may affect medication adherence? financial need Are meds synced by current pharmacy? No  Are meds delivered by current pharmacy? No  Does patient experience delays in picking up medications due to transportation concerns? No   [facility] Reviewed: Is patient disadvantaged to use [facility]?: Yes  Current Rx insurance plan: [employer] PDP Name and location of Current pharmacy:  [facility] -  [place], [place] - [address] [place] [address] Phone: [phone] Fax: [phone]  [facility] [address], [place] - [address] [place] [address] Phone: [phone] Fax: [phone]  [facility] services reviewed with patient today?: No  Patient requests to transfer care to [facility]?: No  Reason patient declined to change pharmacies: Disadvantaged due to insurance/mail order  Compliance/Adherence/Medication fill history: Care Gaps: Foot exam (due 06/2021) Eye exam (due 05/2018) - faxed request to [facility] for records 04/17/22  Star-Rating Drugs: Losartan - PDC 96% Lovastatin - PDC  99%   ASSESSMENT / PLAN   Hypertension / Heart Failure (BP goal <140/90) -Controlled - per home readings; recently there was concern for low BP in office (120/40) however home readings are within goal -Current home readings: 130/60 -Daily weights: 250, 251 -Denies hypotensive/hypertensive symptoms; Denies any falls  -Last ejection fraction: 45-50% (Date: 10/2020) -HF type: HFmrEF (mildly reduced EF 41-49%) -NYHA Class: I-II; AHA HF Stage: C (Heart disease and symptoms present) -Current treatment: Losartan 25 mg daily HS - Appropriate, Effective, Safe, Accessible Furosemide 80 mg daily + extra PRN -Appropriate, Effective, Safe, Accessible Doxazosin 1 mg BID -Appropriate, Effective, Safe, Accessible Isosorbide MN 30 mg daily -Appropriate, Effective, Safe, Accessible Farxiga 10 mg daily - not started  -Medications previously tried: amlodipine - stopped per Cardio 04/2020 -Counseled to monitor BP at home weekly, document, and provide log at future appointments -Recommended to continue current medication  Diabetes (A1c goal <7%) -Not ideally controlled - A1c 7.4% (03/2022); pt recently started Ozempic 03/14/22 and reports no side effects; he was recently prescribed Farxiga per nephrology but did not start it due to high cost -Has steroid injection every 3-4 months. -Follows with endocrine (Dr Lonzo Cloud), est 03/2022 -Pt eats 2 meals/day -Reviewed AGP report: 05/26/22 to 06/08/22. Sensor active: 77%  Time in range (70-180): 56% (goal > 70%)  High (180-250): 27%  Very high (>250): 15%  Low (< 70): 2% (goal < 4%)  GMI: 7.5%; Average glucose: 174  Previous AGP report: 05/08/22 to 05/21/22. 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, retail (cost) -Reviewed insulin-sparing effect of Ozempic, discussed risk for hypoglycemia and monitoring glucose closely to prevent low glucose; advised to continue current dose of Ozempic for now, option to increase in future if eneded -Reviewed benefits of Farxiga including CKD, CV and DM; he may qualify for PAP and has 10-day sample currently; Advised to start Farxiga sample, if tolerated we will apply for PAP or [employer] grant (cardiomyopathy)   Atrial Fibrillation (Goal: prevent stroke and major bleeding) -Not ideally controlled - off anticoagulation due to cost issues -CHADSVASC: 7 -Pt follows with cardiology (Dr [name]), pt deferred anticoagulation at most recent appt 12/2021 -Current treatment: Aspirin 81 mg daily - Query appropriate -Medications previously tried: Eliquis (cost) -Consider warfarin or Xarelto With Me program in future  Hyperlipidemia / CAD (LDL goal < 70) -Controlled- LDL 48 (06/2021) at goal -Hx NSTEMI 01/2021; hx PAD -Current treatment: Lovastatin 40 mg HS -  Appropriate, Effective, Safe, Accessible Nitroglycerin 0.4 mg SL PRN -Appropriate, Effective, Safe, Accessible Isosorbide MN 30 mg daily -Appropriate, Effective, Safe, Accessible -Medications previously tried: n/a  -Educated on Cholesterol goals;  -Recommended to continue current medication  Chronic Kidney Disease Stage 3a  -All medications assessed for renal dosing and appropriateness in chronic kidney disease. -Follows with Nephrology ([facility]); recently ordered [place] but pt did not start due to high cost (see Diabetes above) -Recommended to continue current medication  Back pain (Goal: manage pain) -Controlled -Follows with PM&R (Dr Ethelene Hal) -Current treatment   Hydrocodone-APAP 7.5-325 mg #120/month - Appropriate, Effective, Safe, Accessible -Medications previously tried: n/a  -Recommended to continue current medication   Al Corpus, PharmD, BCACP Clinical Pharmacist [facility] [phone]

## 2022-06-13 NOTE — Progress Notes (Signed)
  Subjective:  Patient ID: Donald [name], male    DOB: 07-06-1945,  MRN: [id number]  Arthritis in left foot interested in injection  77 y.o. male presents with the above complaint. History confirmed with patient.  Flared up about a week ago and is very painful.  He will do another injection as the injection has helped.  Given 4 months of relief.  Objective:  Physical Exam: warm, good capillary refill, no trophic changes or ulcerative lesions, normal DP and PT pulses, normal sensory exam, and pain over the tarsometatarsal joints laterally  Assessment:   No diagnosis found.      Plan:  Patient was evaluated and treated and all questions answered.  Discussed treatment options for his osteoarthritis he has responded well to injection therapy so far.  We will plan on doing another injection.  Following sterile prep with Betadine and utilizing aseptic technique 20 mg Kenalog was injected into the fourth tarsometatarsal joint space from a dorsal approach.  He tolerated as well as dressed with a Band-Aid.  Return as needed for further injections.  If the is no improvement we will discuss MRI options    No follow-ups on file.

## 2022-06-19 ENCOUNTER — Telehealth: Payer: Self-pay

## 2022-06-19 ENCOUNTER — Ambulatory Visit: Payer: Medicare Other | Admitting: Internal Medicine

## 2022-06-19 DIAGNOSIS — Z961 Presence of intraocular lens: Secondary | ICD-10-CM | POA: Diagnosis not present

## 2022-06-19 DIAGNOSIS — H52223 Regular astigmatism, bilateral: Secondary | ICD-10-CM | POA: Diagnosis not present

## 2022-06-19 DIAGNOSIS — E113293 Type 2 diabetes mellitus with mild nonproliferative diabetic retinopathy without macular edema, bilateral: Secondary | ICD-10-CM | POA: Diagnosis not present

## 2022-06-19 DIAGNOSIS — H5203 Hypermetropia, bilateral: Secondary | ICD-10-CM | POA: Diagnosis not present

## 2022-06-19 LAB — HM DIABETES EYE EXAM

## 2022-06-19 NOTE — Telephone Encounter (Signed)
noted 

## 2022-06-19 NOTE — Telephone Encounter (Signed)
Left detailed message on VM per DPR that his Ozempic Pt asst med has arrived. It is in the 2nd fridge in the medication area.

## 2022-06-19 NOTE — Telephone Encounter (Signed)
Wife will pick up medication when she comes in for her labs tomorrow morning 06/20/2022).

## 2022-06-20 ENCOUNTER — Encounter: Payer: Self-pay | Admitting: Internal Medicine

## 2022-06-21 ENCOUNTER — Ambulatory Visit: Payer: Medicare Other | Admitting: Internal Medicine

## 2022-06-21 ENCOUNTER — Other Ambulatory Visit: Payer: Self-pay | Admitting: Internal Medicine

## 2022-06-21 DIAGNOSIS — E1122 Type 2 diabetes mellitus with diabetic chronic kidney disease: Secondary | ICD-10-CM

## 2022-06-21 DIAGNOSIS — N200 Calculus of kidney: Secondary | ICD-10-CM | POA: Diagnosis not present

## 2022-06-23 ENCOUNTER — Other Ambulatory Visit: Payer: Self-pay | Admitting: Family Medicine

## 2022-06-23 DIAGNOSIS — M1A9XX0 Chronic gout, unspecified, without tophus (tophi): Secondary | ICD-10-CM

## 2022-06-23 DIAGNOSIS — D510 Vitamin B12 deficiency anemia due to intrinsic factor deficiency: Secondary | ICD-10-CM

## 2022-06-23 DIAGNOSIS — Z125 Encounter for screening for malignant neoplasm of prostate: Secondary | ICD-10-CM

## 2022-06-23 DIAGNOSIS — N183 Chronic kidney disease, stage 3 unspecified: Secondary | ICD-10-CM

## 2022-06-23 DIAGNOSIS — E538 Deficiency of other specified B group vitamins: Secondary | ICD-10-CM

## 2022-06-23 DIAGNOSIS — E559 Vitamin D deficiency, unspecified: Secondary | ICD-10-CM

## 2022-06-23 DIAGNOSIS — E1165 Type 2 diabetes mellitus with hyperglycemia: Secondary | ICD-10-CM

## 2022-06-23 DIAGNOSIS — D509 Iron deficiency anemia, unspecified: Secondary | ICD-10-CM

## 2022-06-23 DIAGNOSIS — E1169 Type 2 diabetes mellitus with other specified complication: Secondary | ICD-10-CM

## 2022-06-23 NOTE — Progress Notes (Unsigned)
Date:  06/23/2022   ID:  Donald [name], DOB 01-27-1945, MRN [id number]  Patient Location:  [address] [place] [address]   Provider location:   [facility], [place] office  PCP:  [name], MD  Cardiologist:  [facility]   No chief complaint on file.   History of Present Illness:    Donald [name] is a 77 y.o. male  past medical history of Long-standing diabetes, HBA1C 7.7 hyperlipidemia,  coronary artery disease,  Prior cardiac catheterization 2012 with 40% coronary disease noted Diffuse three-vessel coronary calcification on CT scan Cath 10/22: nonobstructive PAD,   chronic kidney disease, hypercalcemia,   OSA on CPAP,  Surgery of his parathyroid Chronic back pain Who presents for follow-up of his coronary artery disease, Paroxysmal afib, aortic valve stenosis, syncope October 2022  Last seen in clinic by myself December 2023 Not on eliquis, secondary to price, has been taking aspirin alone Was denied patient assistance for Eliquis Denies any tachypalpitations concerning for arrhythmia  One night, could not get a breath Called EMS, "BP fine, EKG ok", was not transported to the hospital  Does not feel he has extra fluid, remains on Lasix 80 daily Not been taking extra Lasix  Lab work reviewed A1C 7.5, followed by endocrine Total cholesterol 105 LDL 48 Creatinine 1.45 BUN 30  No regular exercise program, chronic knee pain, walks with a cane  EKG personally reviewed by myself on todays visit Normal sinus rhythm rate 62 bpm no significant ST-T wave changes  Other past medical history reviewed Prior hospitalization October 2022 for, Syncope , found on his garage floor Low potassium 2.9 , low sugar also noted to be in atrial fibrillation  Cardiac catheterization in October 2022 Mild, non-obstructive coronary artery disease, as detailed below.  No significant stenosis identified to explain syncope or  cardiomyopathy. Normal left ventricular filling pressure. Mild-moderate aortic valve stenosis based on peak-to-peak gradient (~20 mmHg).  Echo showed EF of 45 to 50% with global hypokinesis, grade III diastolic dysfunction.   Carotid ultrasound less than 50% stenosis bilaterally.  MRI with no acute intracranial findings  Other past medical history reviewed Abnormal EKG leading to stress testing on prior clinic visit This showed no significant ischemia   Had parathyroid, surgery Now with vertigo Following surgery Mild improvement with meclizine, though still symptomatic    previous PET/CT imaging   mild to moderate calcified plaque in the carotid arteries right greater than left Mild to moderate plaque in the aortic arch Diffuse three-vessel coronary calcifications He does have mild to moderate plaquing in the branches off the mid to distal descending aorta  staghorn left renal calculus   Recent parathyroid spect scan  showed a focal abnormal sestamibi location suspicious for right inferior parathyroid adenoma. Nonspecific tracer localization within single normal-sized left axillary and right subpectoral lymph node.    elective parathyroidectomy on 02/29/2016.    Past Medical History:  Diagnosis Date   (HFpEF) heart failure with preserved ejection fraction (HCC)    a. 09/2018 Echo: EF 60-65%. PASP . Mild-mod LAE. Mild MR/TR.   ANEMIA-IRON DEFICIENCY 07/26/2006   Anxiety    ASTHMA 07/26/2006   Asthma    Back pain, chronic    Bell's palsy    CKD (chronic kidney disease), stage III (HCC)    COLONIC POLYPS, HX OF 07/26/2006   COVID-19 virus infection 01/2020   DEPRESSION 03/14/2009   DIABETES MELLITUS, TYPE II 07/26/2006   DISC  DISEASE, LUMBAR 10/05/2007   DVT 12/03/2007   DYSLIPIDEMIA 04/13/2009   Dyspnea    when gets up and walks around and back is hurting really bad -only Shortness of breath  then   GERD 07/26/2006   Gout    Heart murmur    History of kidney  stones    HYPERTENSION 07/26/2006   INSOMNIA 08/21/2007   Neuropathy    Nonobstructive CAD (coronary artery disease)    a. 2012 Cath: no high grade stenosis; b. 2018 MV: No ischemia. Attenuation artifact.    OBSTRUCTIVE SLEEP APNEA 12/03/2007   Use C-PAP   PERIPHERAL NEUROPATHY 07/26/2006   Pernicious anemia 11/20/2006   PULMONARY EMBOLISM 10/05/2007   Vertigo    Past Surgical History:  Procedure Laterality Date   APPENDECTOMY  1968   BACK SURGERY  1977, 06/14/2008   Dr. Dutch Quint at [facility] [phone])   CARDIAC CATHETERIZATION     CARPAL TUNNEL RELEASE Right 10/2017   CATARACT EXTRACTION W/PHACO Left 05/30/2014   Procedure: CATARACT EXTRACTION PHACO AND INTRAOCULAR LENS PLACEMENT (IOC);  Surgeon: Sallee Lange, MD;  Location: [facility];  Service: Ophthalmology;  Laterality: Left;  [name] 01:20 AP% 23.7 CDE 31.86   CATARACT EXTRACTION W/PHACO Right 09/06/2021   Procedure: CATARACT EXTRACTION PHACO AND INTRAOCULAR LENS PLACEMENT (IOC) RIGHT DIABETIC VISION BLUE OMIDRIA MALYUGIN IRIS HOOKS;  Surgeon: Estanislado Pandy, MD;  Location: [facility];  Service: Ophthalmology;  Laterality: Right;  14.65 2.11.1   COLONOSCOPY  10/2002   HP, SSA, TA, rpt 3 yrs ([place])   COLONOSCOPY  01/2005   diverticulosis rpt 5 yrs ([place])    EPIDURAL BLOCK INJECTION Bilateral 06/2021   bilat L5 nerve block with benefit ([place])   IR NEPHROSTOMY PLACEMENT LEFT  10/19/2018   IR URETERAL STENT LEFT NEW ACCESS W/O SEP NEPHROSTOMY CATH  10/19/2018   LEFT HEART CATH AND CORONARY ANGIOGRAPHY N/A 11/01/2020   Procedure: LEFT HEART CATH AND CORONARY ANGIOGRAPHY;  Surgeon: Yvonne Kendall, MD;  Location: [facility];  Service: Cardiovascular;  Laterality: N/A;   LITHOTRIPSY     X 2   NEPHROLITHOTOMY Left 10/19/2018   Procedure: NEPHROLITHOTOMY PERCUTANEOUS;  Surgeon: Marcine Matar, MD;  Location: [facility];  Service: Urology;  Laterality: Left;  3 HRS   PARATHYROIDECTOMY Right  04/18/2016   PARATHYROIDECTOMY for cyst Bud Face, MD)   SHOULDER ARTHROSCOPY W/ ROTATOR CUFF REPAIR Right    TONSILLECTOMY       No outpatient medications have been marked as taking for the 06/24/22 encounter (Appointment) with Antonieta Iba, MD.     Allergies:   Codeine and Pioglitazone   Social History   Tobacco Use   Smoking status: Never   Smokeless tobacco: Never  Vaping Use   Vaping Use: Never used  Substance Use Topics   Alcohol use: No   Drug use: No      Family Hx: The patient's family history includes Cancer in his mother and sister; Diabetes in his father and paternal grandfather; Heart disease in his father.  ROS:   Please see the history of present illness.    Review of Systems  Constitutional: Negative.   HENT: Negative.    Respiratory: Negative.    Cardiovascular: Negative.   Gastrointestinal: Negative.   Musculoskeletal:  Positive for joint pain.  Neurological: Negative.   Psychiatric/Behavioral: Negative.    All other systems reviewed and are negative.    Labs/Other Tests and Data Reviewed:    Recent Labs: 12/31/2021: BUN 42; Creatinine,  Ser 1.88; Hemoglobin 13.5; Platelets 190.0; Potassium 4.4; Sodium 143   Recent Lipid Panel Lab Results  Component Value Date/Time   CHOL 105 06/20/2021 09:07 AM   TRIG 104.0 06/20/2021 09:07 AM   HDL 36.30 (L) 06/20/2021 09:07 AM   CHOLHDL 3 06/20/2021 09:07 AM   LDLCALC 48 06/20/2021 09:07 AM   LDLDIRECT 52.2 05/25/2009 09:37 AM    Wt Readings from Last 3 Encounters:  04/02/22 261 lb (118.4 kg)  04/01/22 264 lb 6 oz (119.9 kg)  12/31/21 255 lb 3.2 oz (115.8 kg)     Exam:    Vital Signs: Vital signs may also be detailed in the HPI There were no vitals taken for this visit.  Constitutional:  oriented to person, place, and time. No distress.  HENT:  Head: Grossly normal Eyes:  no discharge. No scleral icterus.  Neck: No JVD, no carotid bruits  Cardiovascular: Regular rate and rhythm,  no murmurs appreciated Pulmonary/Chest: Clear to auscultation bilaterally, no wheezes or rails Abdominal: Soft.  no distension.  no tenderness.  Musculoskeletal: Normal range of motion Neurological:  normal muscle tone. Coordination normal. No atrophy Skin: Skin warm and dry Psychiatric: normal affect, pleasant  ASSESSMENT & PLAN:    Afib, paroxysmal Maintaining normal sinus rhythm Previously declined Eliquis secondary to price Did not receive patient assistance for Eliquis Discussed generic Pradaxa.  He prefers no change at this time  Essential hypertension -  Blood pressure is well controlled on today's visit. No changes made to the medications.  Dyslipidemia -  Cholesterol is at goal on the current lipid regimen. No changes to the medications were made.  Coronary artery disease involving native coronary artery of native heart with angina pectoris (HCC) Significant three-vessel coronary calcification seen on  CT scan stress Myoview with no significant ischemia cardiac catheterization, October 2022 with nonobstructive disease Cholesterol at goal   Aortic atherosclerosis (HCC) Seen in the aortic arch and branches of the descending aorta Cholesterol at goal  Morbid obesity (HCC) We have encouraged continued  careful diet management in an effort to lose weight.  Type 2 diabetes mellitus without complication, without long-term current use of insulin (HCC) Hemoglobin A1c of 7.5, followed by endocrine Could consider Ozempic/Mounjaro  Lower extremity edema Leg edema stable, minimal Taking Lasix daily Renal function stable  Shortness of breath Recent episode, EMS called to the house Etiology unclear, unable to exclude arrhythmia versus pulmonary edema Recommended for recurrent episodes he take nitro, extra Lasix and call our office   Total encounter time more than 30 minutes  Greater than 50% was spent in counseling and coordination of care with the  patient   Signed, Julien Nordmann, MD  06/23/2022 3:26 PM    [facility] [address], [place], [place] [address]

## 2022-06-24 ENCOUNTER — Ambulatory Visit: Payer: Medicare Other | Attending: Cardiovascular Disease | Admitting: Cardiovascular Disease

## 2022-06-24 ENCOUNTER — Encounter: Payer: Self-pay | Admitting: Cardiovascular Disease

## 2022-06-24 VITALS — BP 110/60 | HR 60 | Ht 65.0 in | Wt 254.4 lb

## 2022-06-24 DIAGNOSIS — R55 Syncope and collapse: Secondary | ICD-10-CM | POA: Insufficient documentation

## 2022-06-24 DIAGNOSIS — I739 Peripheral vascular disease, unspecified: Secondary | ICD-10-CM | POA: Insufficient documentation

## 2022-06-24 DIAGNOSIS — I5032 Chronic diastolic (congestive) heart failure: Secondary | ICD-10-CM | POA: Diagnosis not present

## 2022-06-24 DIAGNOSIS — I420 Dilated cardiomyopathy: Secondary | ICD-10-CM | POA: Diagnosis not present

## 2022-06-24 DIAGNOSIS — E782 Mixed hyperlipidemia: Secondary | ICD-10-CM | POA: Diagnosis not present

## 2022-06-24 DIAGNOSIS — I10 Essential (primary) hypertension: Secondary | ICD-10-CM | POA: Insufficient documentation

## 2022-06-24 DIAGNOSIS — N183 Chronic kidney disease, stage 3 unspecified: Secondary | ICD-10-CM | POA: Insufficient documentation

## 2022-06-24 DIAGNOSIS — I6523 Occlusion and stenosis of bilateral carotid arteries: Secondary | ICD-10-CM | POA: Insufficient documentation

## 2022-06-24 DIAGNOSIS — I25118 Atherosclerotic heart disease of native coronary artery with other forms of angina pectoris: Secondary | ICD-10-CM | POA: Insufficient documentation

## 2022-06-24 NOTE — Patient Instructions (Signed)
Medication Instructions:  Hold lasix 2 days a week  If you need a refill on your cardiac medications before your next appointment, please call your pharmacy.   Lab work: No new labs needed  Testing/Procedures: No new testing needed  Follow-Up: At [facility], you and your health needs are our priority.  As part of our continuing mission to provide you with exceptional heart care, we have created designated Provider Care Teams.  These Care Teams include your primary Cardiologist (physician) and Advanced Practice Providers (APPs -  Physician Assistants and Nurse Practitioners) who all work together to provide you with the care you need, when you need it.  You will need a follow up appointment in 12 months  Providers on your designated Care Team:   Nicolasa Ducking, NP Eula Listen, PA-C Cadence Fransico Michael, [place]  COVID-19 Vaccine Information can be found at: [email or web address] For questions related to vaccine distribution or appointments, please email [email or web address] or call [phone].

## 2022-06-25 ENCOUNTER — Other Ambulatory Visit (INDEPENDENT_AMBULATORY_CARE_PROVIDER_SITE_OTHER): Payer: Medicare Other

## 2022-06-25 DIAGNOSIS — Z794 Long term (current) use of insulin: Secondary | ICD-10-CM | POA: Diagnosis not present

## 2022-06-25 DIAGNOSIS — D509 Iron deficiency anemia, unspecified: Secondary | ICD-10-CM

## 2022-06-25 DIAGNOSIS — N183 Chronic kidney disease, stage 3 unspecified: Secondary | ICD-10-CM

## 2022-06-25 DIAGNOSIS — E785 Hyperlipidemia, unspecified: Secondary | ICD-10-CM

## 2022-06-25 DIAGNOSIS — E1165 Type 2 diabetes mellitus with hyperglycemia: Secondary | ICD-10-CM

## 2022-06-25 DIAGNOSIS — Z125 Encounter for screening for malignant neoplasm of prostate: Secondary | ICD-10-CM

## 2022-06-25 DIAGNOSIS — E559 Vitamin D deficiency, unspecified: Secondary | ICD-10-CM

## 2022-06-25 DIAGNOSIS — D510 Vitamin B12 deficiency anemia due to intrinsic factor deficiency: Secondary | ICD-10-CM

## 2022-06-25 DIAGNOSIS — M1A9XX0 Chronic gout, unspecified, without tophus (tophi): Secondary | ICD-10-CM | POA: Diagnosis not present

## 2022-06-25 DIAGNOSIS — E1169 Type 2 diabetes mellitus with other specified complication: Secondary | ICD-10-CM | POA: Diagnosis not present

## 2022-06-25 DIAGNOSIS — E538 Deficiency of other specified B group vitamins: Secondary | ICD-10-CM

## 2022-06-25 DIAGNOSIS — E1122 Type 2 diabetes mellitus with diabetic chronic kidney disease: Secondary | ICD-10-CM

## 2022-06-25 LAB — COMPREHENSIVE METABOLIC PANEL
ALT: 11 U/L (ref 0–53)
AST: 11 U/L (ref 0–37)
Albumin: 4.1 g/dL (ref 3.5–5.2)
Alkaline Phosphatase: 88 U/L (ref 39–117)
BUN: 29 mg/dL — ABNORMAL HIGH (ref 6–23)
CO2: 32 mEq/L (ref 19–32)
Calcium: 9.1 mg/dL (ref 8.4–10.5)
Chloride: 102 mEq/L (ref 96–112)
Creatinine, Ser: 1.51 mg/dL — ABNORMAL HIGH (ref 0.40–1.50)
GFR: 45.22 mL/min — ABNORMAL LOW (ref >60.00)
Glucose, Bld: 88 mg/dL (ref 70–99)
Potassium: 4.3 mEq/L (ref 3.5–5.1)
Sodium: 143 mEq/L (ref 135–145)
Total Bilirubin: 1.2 mg/dL (ref 0.2–1.2)
Total Protein: 6.3 g/dL (ref 6.0–8.3)

## 2022-06-25 LAB — MICROALBUMIN / CREATININE URINE RATIO
Creatinine,U: 108.1 mg/dL
Microalb Creat Ratio: 33.1 mg/g — ABNORMAL HIGH (ref 0.0–30.0)
Microalb, Ur: 35.8 mg/dL — ABNORMAL HIGH (ref 0.0–1.9)

## 2022-06-25 LAB — FERRITIN: Ferritin: 52.2 ng/mL (ref 22.0–322.0)

## 2022-06-25 LAB — PSA, MEDICARE: PSA: 0.93 ng/ml (ref 0.10–4.00)

## 2022-06-25 LAB — CBC WITH DIFFERENTIAL/PLATELET
Basophils Absolute: 0 10*3/uL (ref 0.0–0.1)
Basophils Relative: 0.3 % (ref 0.0–3.0)
Eosinophils Absolute: 0.3 10*3/uL (ref 0.0–0.7)
Eosinophils Relative: 3.3 % (ref 0.0–5.0)
HCT: 41.4 % (ref 39.0–52.0)
Hemoglobin: 13.2 g/dL (ref 13.0–17.0)
Lymphocytes Relative: 20.5 % (ref 12.0–46.0)
Lymphs Abs: 1.6 10*3/uL (ref 0.7–4.0)
MCHC: 31.8 g/dL (ref 30.0–36.0)
MCV: 89.2 fl (ref 78.0–100.0)
Monocytes Absolute: 0.5 10*3/uL (ref 0.1–1.0)
Monocytes Relative: 6.3 % (ref 3.0–12.0)
Neutro Abs: 5.6 10*3/uL (ref 1.4–7.7)
Neutrophils Relative %: 69.6 % (ref 43.0–77.0)
Platelets: 188 10*3/uL (ref 150.0–400.0)
RBC: 4.64 Mil/uL (ref 4.22–5.81)
RDW: 16.9 % — ABNORMAL HIGH (ref 11.5–15.5)
WBC: 8 10*3/uL (ref 4.0–10.5)

## 2022-06-25 LAB — LIPID PANEL
Cholesterol: 93 mg/dL (ref 0–200)
HDL: 31 mg/dL — ABNORMAL LOW (ref >39.00)
LDL Cholesterol: 43 mg/dL (ref 0–99)
NonHDL: 62
Total CHOL/HDL Ratio: 3
Triglycerides: 96 mg/dL (ref 0.0–149.0)
VLDL: 19.2 mg/dL (ref 0.0–40.0)

## 2022-06-25 LAB — IBC PANEL
Iron: 54 ug/dL (ref 42–165)
Saturation Ratios: 17.3 % — ABNORMAL LOW (ref 20.0–50.0)
TIBC: 312.2 ug/dL (ref 250.0–450.0)
Transferrin: 223 mg/dL (ref 212.0–360.0)

## 2022-06-25 LAB — PHOSPHORUS: Phosphorus: 3.6 mg/dL (ref 2.3–4.6)

## 2022-06-25 LAB — HEMOGLOBIN A1C: Hgb A1c MFr Bld: 7.6 % — ABNORMAL HIGH (ref 4.6–6.5)

## 2022-06-25 LAB — VITAMIN B12: Vitamin B-12: 540 pg/mL (ref 211–911)

## 2022-06-25 LAB — URIC ACID: Uric Acid, Serum: 5.8 mg/dL (ref 4.0–7.8)

## 2022-06-25 LAB — VITAMIN D 25 HYDROXY (VIT D DEFICIENCY, FRACTURES): VITD: 52.06 ng/mL (ref 30.00–100.00)

## 2022-06-26 LAB — PARATHYROID HORMONE, INTACT (NO CA): PTH: 199 pg/mL — ABNORMAL HIGH (ref 16–77)

## 2022-07-02 ENCOUNTER — Encounter: Payer: Self-pay | Admitting: Family Medicine

## 2022-07-02 ENCOUNTER — Ambulatory Visit (INDEPENDENT_AMBULATORY_CARE_PROVIDER_SITE_OTHER): Payer: Medicare Other | Admitting: Family Medicine

## 2022-07-02 VITALS — BP 158/48 | HR 73 | Temp 97.6°F | Ht 63.25 in | Wt 252.4 lb

## 2022-07-02 DIAGNOSIS — D510 Vitamin B12 deficiency anemia due to intrinsic factor deficiency: Secondary | ICD-10-CM

## 2022-07-02 DIAGNOSIS — I480 Paroxysmal atrial fibrillation: Secondary | ICD-10-CM

## 2022-07-02 DIAGNOSIS — E113299 Type 2 diabetes mellitus with mild nonproliferative diabetic retinopathy without macular edema, unspecified eye: Secondary | ICD-10-CM

## 2022-07-02 DIAGNOSIS — E559 Vitamin D deficiency, unspecified: Secondary | ICD-10-CM | POA: Diagnosis not present

## 2022-07-02 DIAGNOSIS — J309 Allergic rhinitis, unspecified: Secondary | ICD-10-CM | POA: Diagnosis not present

## 2022-07-02 DIAGNOSIS — G8929 Other chronic pain: Secondary | ICD-10-CM

## 2022-07-02 DIAGNOSIS — Z0000 Encounter for general adult medical examination without abnormal findings: Secondary | ICD-10-CM | POA: Diagnosis not present

## 2022-07-02 DIAGNOSIS — I25118 Atherosclerotic heart disease of native coronary artery with other forms of angina pectoris: Secondary | ICD-10-CM

## 2022-07-02 DIAGNOSIS — E1122 Type 2 diabetes mellitus with diabetic chronic kidney disease: Secondary | ICD-10-CM | POA: Diagnosis not present

## 2022-07-02 DIAGNOSIS — D509 Iron deficiency anemia, unspecified: Secondary | ICD-10-CM

## 2022-07-02 DIAGNOSIS — G4733 Obstructive sleep apnea (adult) (pediatric): Secondary | ICD-10-CM | POA: Diagnosis not present

## 2022-07-02 DIAGNOSIS — M1A9XX0 Chronic gout, unspecified, without tophus (tophi): Secondary | ICD-10-CM | POA: Diagnosis not present

## 2022-07-02 DIAGNOSIS — E1169 Type 2 diabetes mellitus with other specified complication: Secondary | ICD-10-CM | POA: Diagnosis not present

## 2022-07-02 DIAGNOSIS — E538 Deficiency of other specified B group vitamins: Secondary | ICD-10-CM

## 2022-07-02 DIAGNOSIS — I5032 Chronic diastolic (congestive) heart failure: Secondary | ICD-10-CM

## 2022-07-02 DIAGNOSIS — E1142 Type 2 diabetes mellitus with diabetic polyneuropathy: Secondary | ICD-10-CM | POA: Diagnosis not present

## 2022-07-02 DIAGNOSIS — N183 Chronic kidney disease, stage 3 unspecified: Secondary | ICD-10-CM

## 2022-07-02 DIAGNOSIS — M545 Low back pain, unspecified: Secondary | ICD-10-CM

## 2022-07-02 DIAGNOSIS — I739 Peripheral vascular disease, unspecified: Secondary | ICD-10-CM | POA: Diagnosis not present

## 2022-07-02 DIAGNOSIS — Z7189 Other specified counseling: Secondary | ICD-10-CM

## 2022-07-02 DIAGNOSIS — E785 Hyperlipidemia, unspecified: Secondary | ICD-10-CM

## 2022-07-02 DIAGNOSIS — I10 Essential (primary) hypertension: Secondary | ICD-10-CM

## 2022-07-02 DIAGNOSIS — Z794 Long term (current) use of insulin: Secondary | ICD-10-CM

## 2022-07-02 NOTE — Assessment & Plan Note (Signed)

## 2022-07-02 NOTE — Assessment & Plan Note (Signed)
Advanced directive discussion - has at home. HCPOA would be wife. Asked to bring us a copy.  

## 2022-07-02 NOTE — Progress Notes (Signed)
Ph: [phone] Fax: [phone]   Patient ID: Donald [name], male    DOB: 05-24-1945, 77 y.o.   MRN: [id number]  This visit was conducted in person.  BP (!) 158/48 (BP Location: Right Arm, Cuff Size: Large)   Pulse 73   Temp 97.6 F (36.4 C) (Temporal)   Ht 5' 3.25" (1.607 m)   Wt 252 lb 6 oz (114.5 kg)   SpO2 94%   BMI 44.35 kg/m   BP Readings from Last 3 Encounters:  07/02/22 (!) 158/48  06/24/22 110/60  06/13/22 (!) 163/56   CC: AMW  Subjective:   HPI: Donald [name] is a 77 y.o. male presenting on 07/02/2022 for Medicare Wellness (Wants to discuss taking something else for allergies. )   Did not see health advisor.  Hearing Screening   500Hz  1000Hz  2000Hz  4000Hz   Right ear 25 40 40 0  Left ear 20 40 0 0  Comments: Wears B hearing aids. Not wearing at today's OV.   Vision Screening - Comments:: Last eye exam, 06/2022.   Flowsheet Row Office Visit from 07/02/2022 in [facility] [place]  PHQ-2 Total Score 0          07/02/2022    1:54 PM 04/01/2022   10:27 AM 06/26/2021   11:42 AM 06/21/2020    2:12 PM 04/20/2019    2:05 PM  Fall Risk   Falls in the past year? 0 1 0 0 0  Number falls in past yr:  0   0  Injury with Fall?  0   0  Risk for fall due to :     Medication side effect  Follow up     Falls evaluation completed;Falls prevention discussed    DM - established with Dr Lonzo Cloud on high dose insulin and ozempic 0.5mg  weekly - tolerating well without side effects. 12 lbs down since starting Ozempic. Did not start Marcelline Deist as it was unaffordable .  CAD - saw Dr [name] last week, note reviewed. Anticoagulation has been too expensive (eliquis).   Notes he stays tired. R leg pain when he woke up this morning - hydrocodone helped.   Recently saw Dr Ethelene Hal for hip/leg pain - thought stemming from lumbar spine. Plan was to refer to neurosurgeon in [facility] as Dr Dutch Quint didn't recommend repeat surgery.   Notes significant  difficulty with rhinorrhea in the mornings when he wakes up, lasts 1 hour. This is despite zyrtec daily in the morning.   Care Team: Cardiology - Dr [name] Endocrinology - Dr Everardo All retired --> Dr [facility] Nephrology - Dr Arrie Aran Urology - Dr Retta Diones (kidney stones) PM&R - Dr Ethelene Hal  ENT - Dr Andee Poles Podiatry - Dr Rise Mu - Dr Adolphus Birchwood  Sleep - Dr Vassie Loll   Preventative: Colon cancer screening - colonoscopy 2006 with hyperplastic polyps ([place]). Cologuard normal 01/2016, 09/2020.  Prostate cancer screening - yearly PSA. no BPH symptoms.  Lung cancer screening - not eligible  Flu shot - yearly COVID vaccine Pfizer 02/2019 x2, Pfizer booster 10/2019  Td 04/2008  Pneumovax 2009, 2004, 2020. Prevnar-13 2016 Zostavax - received twice 2011, 2013 Shingrix - discussed, to check at pharmacy.  Advanced directive discussion - has at home. HCPOA would be wife. Asked to bring [name] a copy.  Seat belt use discussed.  Sunscreen use discussed. No changing moles on skin. Sees Dr Adolphus Birchwood.  Smoking - none Alcohol  -  none Dentist - q6 mo Axel Filler)  Eye exam -  yearly ([place] -> [place]) s/p bilateral cataract surgery  Bowel - some constipation with pain medication - actually better since he's cut down opiate use manages with dulcolax  Bladder - no incontinence   Married (Priscilla) Grown children (son [name]) Worked biological [employer]-disabled due to back pain. Activity: limited by medical problems Diet: good water, fruits/vegetables daily     Relevant past medical, surgical, family and social history reviewed and updated as indicated. Interim medical history since our last visit reviewed. Allergies and medications reviewed and updated. Outpatient Medications Prior to Visit  Medication Sig Dispense Refill   albuterol (VENTOLIN HFA) 108 (90 Base) MCG/ACT inhaler Inhale 2 puffs into the lungs every 6 (six) hours as needed for wheezing or shortness of breath. 8 g 0   allopurinol (ZYLOPRIM) 300 MG  tablet TAKE ONE TABLET BY MOUTH EVERY DAY 90 tablet 3   ALPRAZolam (XANAX) 1 MG tablet TAKE 1 TABLET BY MOUTH AT BEDTIME IF NEEDED FOR SLEEP. 30 tablet 0   aspirin 81 MG EC tablet Take 81 mg by mouth daily. Swallow whole.     Cholecalciferol (VITAMIN D3) 25 MCG (1000 UT) CAPS Take 1 capsule (1,000 Units total) by mouth daily. 30 capsule    colchicine 0.6 MG tablet TAKE 1 TABLET BY MOUTH DAILY AS NEEDED FOR GOUT GLARE. ON FIRST DAY OF GOUT FLARE,MAY TAKE 2 TABLETS AT ONCE AS DIRECTED 30 tablet 0   Continuous Blood Gluc Sensor (FREESTYLE LIBRE 2 SENSOR) MISC 1 Device by Does not apply route every 14 (fourteen) days. 6 each 3   cyanocobalamin (VITAMIN B12) 1000 MCG/ML injection INJECT INTRAMUSCULARLY EVERY 30 DAYS 1 mL 6   cyclobenzaprine (FLEXERIL) 10 MG tablet Take 10 mg by mouth 3 (three) times daily as needed for muscle spasms.     doxazosin (CARDURA) 1 MG tablet TAKE ONE TABLET BY MOUTH TWICE DAILY 180 tablet 1   fluticasone (FLONASE) 50 MCG/ACT nasal spray Place 2 sprays into both nostrils daily. 16 g 1   furosemide (LASIX) 80 MG tablet TAKE 1 TABLET BY MOUTH EVERY DAY. MAY TAKE AN EXTRA PILL FOR SWELLING. 90 tablet 0   gabapentin (NEURONTIN) 600 MG tablet TAKE TWO TABLETS AT BEDTIME 180 tablet 3   glucose blood (GE100 BLOOD GLUCOSE TEST) test strip TEST FOUR TIMES DAILY 200 each 10   HYDROcodone-acetaminophen (NORCO) 10-325 MG tablet Take 1 tablet by mouth every 6 (six) hours as needed for moderate pain ((typically once to twice daily if needed)).  30 tablet 0   isosorbide mononitrate (IMDUR) 30 MG 24 hr tablet TAKE ONE TABLET BY MOUTH DAILY 90 tablet 2   losartan (COZAAR) 25 MG tablet TAKE ONE TABLET BY MOUTH AT BEDTIME 90 tablet 1   lovastatin (MEVACOR) 40 MG tablet TAKE TWO TABLETS BY MOUTH AT BEDTIME 180 tablet 1   meclizine (ANTIVERT) 25 MG tablet Take 25 mg by mouth 3 (three) times daily as needed for dizziness.     Naphazoline-Pheniramine (OPCON-A OP) Place 1 drop into both eyes 3  (three) times daily as needed (itchy eyes.).      nitroGLYCERIN (NITROSTAT) 0.4 MG SL tablet Place 1 tablet (0.4 mg total) under the tongue every 5 (five) minutes as needed for chest pain. 25 tablet 3   NOVOLIN N 100 UNIT/ML injection INJECT 200 UNITS TOTAL INTO THE SKIN AT BEDTIME (Patient taking differently: Inject 160-170 Units into the skin at bedtime.) 100 mL 0   NOVOLIN R 100 UNIT/ML injection INJECT 200 UNITS IN THE MORNING, 140  UNITS IN THE AFTERNOON, AND 280 UNITS IN THE EVENING. USE BEFORE A MEAL 210 mL 0   ondansetron (ZOFRAN) 4 MG tablet Take 1 tablet (4 mg total) by mouth 3 (three) times daily as needed for nausea. 20 tablet 0   Potassium Citrate 15 MEQ (1620 MG) TBCR Take 1 tablet by mouth 3 (three) times daily. Takes 2 tablets daily     Semaglutide,0.25 or 0.5MG /DOS, (OZEMPIC, 0.25 OR 0.5 MG/DOSE,) 2 MG/3ML SOPN 0.5 mg by Other route once a week. Inject 0.25 mg weekly x 4 weeks, then increase to 0.5 mg weekly (Patient taking differently: Inject 0.5 mg into the skin once a week.) 3 mL 6   Sennosides-Docusate Sodium (STOOL SOFTENER & LAXATIVE PO) Take 2 tablets by mouth at bedtime.     Syringe/Needle, Disp, (SYRINGE 3CC/22GX1-1/2") 22G X 1-1/2" 3 ML MISC Use to administer monthly b12 shots 50 each 0   FARXIGA 10 MG TABS tablet Take 10 mg by mouth daily. (Patient not taking: Reported on 06/24/2022)     No facility-administered medications prior to visit.     Per HPI unless specifically indicated in ROS section below Review of Systems  Objective:  BP (!) 158/48 (BP Location: Right Arm, Cuff Size: Large)   Pulse 73   Temp 97.6 F (36.4 C) (Temporal)   Ht 5' 3.25" (1.607 m)   Wt 252 lb 6 oz (114.5 kg)   SpO2 94%   BMI 44.35 kg/m   Wt Readings from Last 3 Encounters:  07/02/22 252 lb 6 oz (114.5 kg)  06/24/22 254 lb 6 oz (115.4 kg)  04/02/22 261 lb (118.4 kg)      Physical Exam Vitals and nursing note reviewed.  Constitutional:      General: He is not in acute distress.     Appearance: Normal appearance. He is well-developed. He is not ill-appearing.  HENT:     Head: Normocephalic and atraumatic.     Right Ear: Hearing, tympanic membrane, ear canal and external ear normal.     Left Ear: Hearing, tympanic membrane, ear canal and external ear normal.     Nose: Rhinorrhea present. Rhinorrhea is clear.     Right Turbinates: Pale. Not enlarged or swollen.     Left Turbinates: Pale. Not enlarged or swollen.     Mouth/Throat:     Mouth: Mucous membranes are moist.     Pharynx: Oropharynx is clear. No oropharyngeal exudate or posterior oropharyngeal erythema.  Eyes:     General: No scleral icterus.    Extraocular Movements: Extraocular movements intact.     Conjunctiva/sclera: Conjunctivae normal.     Pupils: Pupils are equal, round, and reactive to light.  Neck:     Thyroid: No thyroid mass or thyromegaly.     Vascular: No carotid bruit.  Cardiovascular:     Rate and Rhythm: Normal rate and regular rhythm.     Pulses: Normal pulses.          Radial pulses are 2+ on the right side and 2+ on the left side.     Heart sounds: Murmur (2/6 systolic USB) heard.  Pulmonary:     Effort: Pulmonary effort is normal. No respiratory distress.     Breath sounds: Normal breath sounds. No wheezing, rhonchi or rales.  Abdominal:     General: Bowel sounds are normal. There is no distension.     Palpations: Abdomen is soft. There is no mass.     Tenderness: There is no abdominal tenderness. There  is no guarding or rebound.     Hernia: No hernia is present.  Musculoskeletal:        General: Normal range of motion.     Cervical back: Normal range of motion and neck supple.     Right lower leg: No edema.     Left lower leg: No edema.     Comments: Hypertrophy of few DIPs bilaterally  Lymphadenopathy:     Cervical: No cervical adenopathy.  Skin:    General: Skin is warm and dry.     Findings: Rash present.     Comments: Dry patches to dorsal hands at MCPs  Neurological:      General: No focal deficit present.     Mental Status: He is alert and oriented to person, place, and time.     Comments:  Recall 3/3 Calculation 5/5 DLROW  Psychiatric:        Mood and Affect: Mood normal.        Behavior: Behavior normal.        Thought Content: Thought content normal.        Judgment: Judgment normal.       Results for orders placed or performed in visit on 06/25/22  PSA, Medicare  Result Value Ref Range   PSA 0.93 0.10 - 4.00 ng/ml  IBC panel  Result Value Ref Range   Iron 54 42 - 165 ug/dL   Transferrin 086.7 619.5 - 360.0 mg/dL   Saturation Ratios 09.3 (L) 20.0 - 50.0 %   TIBC 312.2 250.0 - 450.0 mcg/dL  Ferritin  Result Value Ref Range   Ferritin 52.2 22.0 - 322.0 ng/mL  Uric acid  Result Value Ref Range   Uric Acid, Serum 5.8 4.0 - 7.8 mg/dL  Vitamin O67  Result Value Ref Range   Vitamin B-12 540 211 - 911 pg/mL  CBC with Differential/Platelet  Result Value Ref Range   WBC 8.0 4.0 - 10.5 K/uL   RBC 4.64 4.22 - 5.81 Mil/uL   Hemoglobin 13.2 13.0 - 17.0 g/dL   HCT 12.4 58.0 - 99.8 %   MCV 89.2 78.0 - 100.0 fl   MCHC 31.8 30.0 - 36.0 g/dL   RDW 33.8 (H) 25.0 - 53.9 %   Platelets 188.0 150.0 - 400.0 K/uL   Neutrophils Relative % 69.6 43.0 - 77.0 %   Lymphocytes Relative 20.5 12.0 - 46.0 %   Monocytes Relative 6.3 3.0 - 12.0 %   Eosinophils Relative 3.3 0.0 - 5.0 %   Basophils Relative 0.3 0.0 - 3.0 %   Neutro Abs 5.6 1.4 - 7.7 K/uL   Lymphs Abs 1.6 0.7 - 4.0 K/uL   Monocytes Absolute 0.5 0.1 - 1.0 K/uL   Eosinophils Absolute 0.3 0.0 - 0.7 K/uL   Basophils Absolute 0.0 0.0 - 0.1 K/uL  Parathyroid hormone, intact (no Ca)  Result Value Ref Range   PTH 199 (H) 16 - 77 pg/mL  Microalbumin / creatinine urine ratio  Result Value Ref Range   Microalb, Ur 35.8 (H) 0.0 - 1.9 mg/dL   Creatinine,U 767.3 mg/dL   Microalb Creat Ratio 33.1 (H) 0.0 - 30.0 mg/g  VITAMIN D 25 Hydroxy (Vit-D Deficiency, Fractures)  Result Value Ref Range   VITD  52.06 30.00 - 100.00 ng/mL  Phosphorus  Result Value Ref Range   Phosphorus 3.6 2.3 - 4.6 mg/dL  Comprehensive metabolic panel  Result Value Ref Range   Sodium 143 135 - 145 mEq/L   Potassium 4.3 3.5 -  5.1 mEq/L   Chloride 102 96 - 112 mEq/L   CO2 32 19 - 32 mEq/L   Glucose, Bld 88 70 - 99 mg/dL   BUN 29 (H) 6 - 23 mg/dL   Creatinine, Ser 3.66 (H) 0.40 - 1.50 mg/dL   Total Bilirubin 1.2 0.2 - 1.2 mg/dL   Alkaline Phosphatase 88 39 - 117 U/L   AST 11 0 - 37 U/L   ALT 11 0 - 53 U/L   Total Protein 6.3 6.0 - 8.3 g/dL   Albumin 4.1 3.5 - 5.2 g/dL   GFR 44.03 (L) >47.42 mL/min   Calcium 9.1 8.4 - 10.5 mg/dL  Lipid panel  Result Value Ref Range   Cholesterol 93 0 - 200 mg/dL   Triglycerides 59.5 0.0 - 149.0 mg/dL   HDL 63.87 (L) >56.43 mg/dL   VLDL 32.9 0.0 - 51.8 mg/dL   LDL Cholesterol 43 0 - 99 mg/dL   Total CHOL/HDL Ratio 3    NonHDL 62.00   Hemoglobin A1c  Result Value Ref Range   Hgb A1c MFr Bld 7.6 (H) 4.6 - 6.5 %    Assessment & Plan:   Problem List Items Addressed This Visit     Medicare annual wellness visit, subsequent - Primary (Chronic)    I have personally reviewed the Medicare Annual Wellness questionnaire and have noted 1. The patient's medical and social history 2. Their use of alcohol, tobacco or illicit drugs 3. Their current medications and supplements 4. The patient's functional ability including ADL's, fall risks, home safety risks and hearing or visual impairment. Cognitive function has been assessed and addressed as indicated.  5. Diet and physical activity 6. Evidence for depression or mood disorders The patients weight, height, BMI have been recorded in the chart. I have made referrals, counseling and provided education to the patient based on review of the above and I have provided the pt with a written personalized care plan for preventive services. Provider list updated.. See scanned questionairre as needed for further documentation. Reviewed  preventative protocols and updated unless pt declined.       Advanced care planning/counseling discussion (Chronic)    Advanced directive discussion - has at home. HCPOA would be wife. Asked to bring [name] a copy.       Dyslipidemia associated with type 2 diabetes mellitus (HCC)    Chronic, stable on lovastatin - continue.  The ASCVD Risk score (Arnett DK, et al., 2019) failed to calculate for the following reasons:   The patient has a prior MI or stroke diagnosis       Iron deficiency anemia    No longer anemic. Iron levels are stable. Continue to monitor.       Pernicious anemia    Continues monthly B12 injections at home.       Essential hypertension    Chronic, blood pressure mildly today.  No changes made however-continue isosorbide doxazosin Lasix and losartan.      Allergic rhinitis    Discussed antihistamines he notes significant difficulty with rhinorrhea despite daily Zyrtec in the morning.  Suggested changing Zyrtec to nightly dosing, changing antihistamine brand, or adding second antihistamine as needed.      Chronic low back pain    Sees PM&R Dr. Ethelene Hal on opiate through their office, status post epidural steroid injections latest January 2024, pending second opinion with a neurosurgeon in [place].  Known lumbar degenerative disc disease.      OSA (obstructive sleep apnea)    Chronic,  continues regular CPAP use, sees Dr. Vassie Loll      CAD (coronary artery disease)    Sees cardiology, only on aspirin.      Obesity, morbid, BMI 40.0-49.9 (HCC)    Continue to encourage healthy diet and lifestyle choices to affect sustainable weight loss.      CKD stage 3 due to type 2 diabetes mellitus (HCC)    Followed by nephrology Dr. Arrie Aran      Type 2 diabetes mellitus with diabetic polyneuropathy (HCC)    Chronic, overall stable, followed by endocrinology on high-dose insulin.      PAD (peripheral artery disease) (HCC)    Continue cholesterol blood pressure and  sugar control      Chronic gout    Gout level stable on allopurinol without recent gout flare.      Vitamin D deficiency    Continue vitamin D 1000 units daily.      Vitamin B12 deficiency    Continue B12 shots at home.      Heart failure with preserved ejection fraction (HCC)    Followed by cardiology.      Paroxysmal atrial fibrillation (HCC)    Chronic, remains off anticoagulant due to cost, continue aspirin.      Background diabetic retinopathy associated with type 2 diabetes mellitus (HCC)     No orders of the defined types were placed in this encounter.   No orders of the defined types were placed in this encounter.   Patient Instructions  If interested, check with pharmacy about new 2 shot shingles series (shingrix) and the new RSV shot.  Bring [name] a copy of your living will.  Change zyrtec to night time dosing. If this doesn't help, change to xyzal at night (different allergy medicine brand) Good to see you today Return in 6 months for follow up visit   Follow up plan: Return in about 6 months (around 01/01/2023) for follow up visit.  [name], MD

## 2022-07-02 NOTE — Patient Instructions (Addendum)
If interested, check with pharmacy about new 2 shot shingles series (shingrix) and the new RSV shot.  Bring [name] a copy of your living will.  Change zyrtec to night time dosing. If this doesn't help, change to xyzal at night (different allergy medicine brand) Good to see you today Return in 6 months for follow up visit

## 2022-07-05 ENCOUNTER — Telehealth: Payer: Self-pay | Admitting: Pharmacist

## 2022-07-05 NOTE — Progress Notes (Signed)
Contacted patient regarding upcoming appointment with [facility] pharmacist. Patient would benefit from or requests a future appointment with clinical pharmacist. Appointment rescheduled.   Catie T. Sparkles Mcneely, PharmD, BCACP, CPP Clinical Pharmacist [facility] [phone]  

## 2022-07-08 ENCOUNTER — Other Ambulatory Visit: Payer: Self-pay | Admitting: Cardiovascular Disease

## 2022-07-08 NOTE — Assessment & Plan Note (Signed)
Continue B12 shots at home.  

## 2022-07-08 NOTE — Assessment & Plan Note (Signed)
Chronic, stable on lovastatin - continue. °The ASCVD Risk score (Arnett DK, et al., 2019) failed to calculate for the following reasons: °  The patient has a prior MI or stroke diagnosis  °

## 2022-07-08 NOTE — Assessment & Plan Note (Signed)
Discussed antihistamines he notes significant difficulty with rhinorrhea despite daily Zyrtec in the morning.  Suggested changing Zyrtec to nightly dosing, changing antihistamine brand, or adding second antihistamine as needed.

## 2022-07-08 NOTE — Assessment & Plan Note (Signed)
Gout level stable on allopurinol without recent gout flare.

## 2022-07-08 NOTE — Assessment & Plan Note (Signed)
Followed by cardiology 

## 2022-07-08 NOTE — Assessment & Plan Note (Signed)
Followed by nephrology Dr. Arrie Aran

## 2022-07-08 NOTE — Assessment & Plan Note (Signed)
Continue to encourage healthy diet and lifestyle choices to affect sustainable weight loss.  °

## 2022-07-08 NOTE — Assessment & Plan Note (Signed)
Chronic, continues regular CPAP use, sees Dr. Vassie Loll

## 2022-07-08 NOTE — Assessment & Plan Note (Signed)
Chronic, blood pressure mildly today.  No changes made however-continue isosorbide doxazosin Lasix and losartan.

## 2022-07-08 NOTE — Assessment & Plan Note (Signed)
Continue vitamin D 1000 units daily. 

## 2022-07-08 NOTE — Assessment & Plan Note (Signed)
No longer anemic. Iron levels are stable. Continue to monitor.

## 2022-07-08 NOTE — Assessment & Plan Note (Addendum)
Sees PM&R Dr. Ethelene Hal on opiate through their office, status post epidural steroid injections latest January 2024, pending second opinion with a neurosurgeon in [place].  Known lumbar degenerative disc disease.

## 2022-07-08 NOTE — Assessment & Plan Note (Signed)
Continue cholesterol blood pressure and sugar control

## 2022-07-08 NOTE — Assessment & Plan Note (Signed)
Sees cardiology, only on aspirin.

## 2022-07-08 NOTE — Assessment & Plan Note (Signed)
Continues monthly B12 injections at home.

## 2022-07-08 NOTE — Assessment & Plan Note (Signed)
Chronic, overall stable, followed by endocrinology on high-dose insulin.

## 2022-07-08 NOTE — Assessment & Plan Note (Signed)
Chronic, remains off anticoagulant due to cost, continue aspirin.

## 2022-07-11 ENCOUNTER — Encounter: Payer: Medicare Other | Admitting: Pharmacist

## 2022-07-11 ENCOUNTER — Other Ambulatory Visit: Payer: Self-pay

## 2022-07-11 ENCOUNTER — Other Ambulatory Visit: Payer: Self-pay | Admitting: Pharmacist

## 2022-07-11 ENCOUNTER — Telehealth: Payer: Self-pay | Admitting: Family Medicine

## 2022-07-11 MED ORDER — FREESTYLE LIBRE 2 SENSOR MISC: 1.0000 | 3 refills | Status: DC

## 2022-07-11 NOTE — Telephone Encounter (Signed)
Noted, will work on reorder. See additional Patient Outreach encounter

## 2022-07-11 NOTE — Telephone Encounter (Signed)
Patient is needing a shipment of his freestyle libre sensors,he said that Lillia Abed usually helps him get them form [employer] to help him get them. Now that she isn't in our office anymore he would like to know who he would speak to regarding this.

## 2022-07-11 NOTE — Telephone Encounter (Signed)
Where should sensors be sent to?

## 2022-07-11 NOTE — Telephone Encounter (Signed)
Order has been sent to [facility]  

## 2022-07-11 NOTE — Progress Notes (Signed)
Care Coordination Call  Received message regarding CGM reorder from [facility]. Appears Endo previously worked on this. Will collaborate with Dr. Harvel Ricks team.   Janeice Robinson, PharmD, BCACP, CPP Clinical Pharmacist [facility] [phone]

## 2022-07-11 NOTE — Telephone Encounter (Signed)
Reviewed patient history. Appears CGM was prescribed by endocrinology, patient follows up with Dr. Lonzo Cloud in August. Will pass to Endocrinology to continue to manage.   Catie Eppie Gibson, PharmD, BCACP, CPP Clinical Pharmacist [facility] [phone]

## 2022-07-22 ENCOUNTER — Other Ambulatory Visit: Payer: Self-pay | Admitting: Internal Medicine

## 2022-07-29 ENCOUNTER — Other Ambulatory Visit: Payer: Self-pay | Admitting: Family Medicine

## 2022-07-29 DIAGNOSIS — M1A9XX0 Chronic gout, unspecified, without tophus (tophi): Secondary | ICD-10-CM

## 2022-08-02 ENCOUNTER — Encounter: Payer: Self-pay | Admitting: Pharmacist

## 2022-08-07 ENCOUNTER — Telehealth: Payer: Self-pay

## 2022-08-07 ENCOUNTER — Other Ambulatory Visit: Payer: Self-pay

## 2022-08-07 DIAGNOSIS — E1122 Type 2 diabetes mellitus with diabetic chronic kidney disease: Secondary | ICD-10-CM

## 2022-08-07 NOTE — Telephone Encounter (Signed)
Pharmacy called to verify prescription for the patient Donald [name]. The old prescription was sent in June but pharmacy was notified of different directions per last office. Patient states that he is using about 400-420 a day and sometimes less.  Need to clarify what directions are correct. Pharmacy dispense 5 files on June 13 and 5 vials on July 3rd. Patient has already ran out and had to buy more insulin over counter.

## 2022-08-08 MED ORDER — NOVOLIN R 100 UNIT/ML IJ SOLN
INTRAMUSCULAR | 1 refills | Status: DC
Start: 2022-08-08 — End: 2022-09-13

## 2022-08-08 NOTE — Telephone Encounter (Signed)
New prescription was sent for 14 vials for 28 days supply base on information patient given on what he is taking. Spoke with pharmacy and they will fill script for what they have and put remainder back on chart to be filled. Patient is aware and will make sure he's getting the correct vials.

## 2022-08-14 ENCOUNTER — Encounter (INDEPENDENT_AMBULATORY_CARE_PROVIDER_SITE_OTHER): Payer: Self-pay

## 2022-08-19 ENCOUNTER — Other Ambulatory Visit: Payer: Medicare Other | Admitting: Pharmacist

## 2022-08-19 ENCOUNTER — Ambulatory Visit (INDEPENDENT_AMBULATORY_CARE_PROVIDER_SITE_OTHER): Payer: Medicare Other | Admitting: Family Medicine

## 2022-08-19 ENCOUNTER — Ambulatory Visit (INDEPENDENT_AMBULATORY_CARE_PROVIDER_SITE_OTHER)
Admission: RE | Admit: 2022-08-19 | Discharge: 2022-08-19 | Disposition: A | Discharge status: Home / Self Care | Payer: Medicare Other | Source: Ambulatory Visit | Attending: Family Medicine | Admitting: Family Medicine

## 2022-08-19 ENCOUNTER — Other Ambulatory Visit: Payer: Self-pay

## 2022-08-19 ENCOUNTER — Encounter: Payer: Self-pay | Admitting: Family Medicine

## 2022-08-19 VITALS — BP 128/80 | HR 60 | Temp 97.3°F | Ht 63.25 in | Wt 253.5 lb

## 2022-08-19 DIAGNOSIS — R252 Cramp and spasm: Secondary | ICD-10-CM | POA: Insufficient documentation

## 2022-08-19 DIAGNOSIS — M79605 Pain in left leg: Secondary | ICD-10-CM | POA: Insufficient documentation

## 2022-08-19 LAB — URIC ACID: Uric Acid, Serum: 5.4 mg/dL (ref 4.0–7.8)

## 2022-08-19 NOTE — Progress Notes (Signed)
Ph: [phone] Fax: [phone]   Patient ID: Donald [name], male    DOB: 07-Jun-1945, 77 y.o.   MRN: [id number]  This visit was conducted in person.  BP 128/80   Pulse 60   Temp (!) 97.3 F (36.3 C) (Temporal)   Ht 5' 3.25" (1.607 m)   Wt 253 lb 8 oz (115 kg)   SpO2 98%   BMI 44.55 kg/m    CC: left leg pain Subjective:   HPI: Donald [name] is a 77 y.o. male presenting on 08/19/2022 for Leg Pain (C/o L leg pain. Started 08/16/22 after extensive walking during beach trip last wk. Denies injury. Tried several OTC pain meds, oral/topicals. Concerned due to h/o DVT. )   L leg pain started 3-4d ago after he did prolonged walk on uneven bricks during beach trip this weekend. Pain started a few hours after this. Marked pain to medial leg below knee. Pain is sharp and stabbing, worse with ambulation.  Long car ride over the weekend.  Chronic back pain with radiation down legs.  No new numbness or weakness of leg or burning pain.   Treated with voltaren gel, other OTC remedies including heating pad, also his hydrocodone Rx.   H/o DVT 2009.      Relevant past medical, surgical, family and social history reviewed and updated as indicated. Interim medical history since our last visit reviewed. Allergies and medications reviewed and updated. Outpatient Medications Prior to Visit  Medication Sig Dispense Refill   albuterol (VENTOLIN HFA) 108 (90 Base) MCG/ACT inhaler Inhale 2 puffs into the lungs every 6 (six) hours as needed for wheezing or shortness of breath. 8 g 0   allopurinol (ZYLOPRIM) 300 MG tablet TAKE ONE TABLET BY MOUTH EVERY DAY 90 tablet 4   ALPRAZolam (XANAX) 1 MG tablet TAKE 1 TABLET BY MOUTH AT BEDTIME IF NEEDED FOR SLEEP. 30 tablet 0   aspirin 81 MG EC tablet Take 81 mg by mouth daily. Swallow whole.     Cholecalciferol (VITAMIN D3) 25 MCG (1000 UT) CAPS Take 1 capsule (1,000 Units total) by mouth daily. 30 capsule    colchicine 0.6 MG tablet TAKE 1 TABLET BY  MOUTH DAILY AS NEEDED FOR GOUT GLARE. ON FIRST DAY OF GOUT FLARE,MAY TAKE 2 TABLETS AT ONCE AS DIRECTED 30 tablet 0   Continuous Glucose Sensor (FREESTYLE LIBRE 2 SENSOR) MISC 1 Device by Does not apply route every 14 (fourteen) days. 6 each 3   cyanocobalamin (VITAMIN B12) 1000 MCG/ML injection INJECT INTRAMUSCULARLY EVERY 30 DAYS 1 mL 6   cyclobenzaprine (FLEXERIL) 10 MG tablet Take 10 mg by mouth 3 (three) times daily as needed for muscle spasms.     doxazosin (CARDURA) 1 MG tablet TAKE ONE TABLET BY MOUTH TWICE DAILY 180 tablet 2   fluticasone (FLONASE) 50 MCG/ACT nasal spray Place 2 sprays into both nostrils daily. 16 g 1   furosemide (LASIX) 80 MG tablet TAKE 1 TABLET BY MOUTH EVERY DAY. MAY TAKE AN EXTRA PILL FOR SWELLING. 90 tablet 0   gabapentin (NEURONTIN) 600 MG tablet TAKE TWO TABLETS AT BEDTIME 180 tablet 3   glucose blood (GE100 BLOOD GLUCOSE TEST) test strip TEST FOUR TIMES DAILY 200 each 10   HYDROcodone-acetaminophen (NORCO) 10-325 MG tablet Take 1 tablet by mouth every 6 (six) hours as needed for moderate pain ((typically once to twice daily if needed)).  30 tablet 0   isosorbide mononitrate (IMDUR) 30 MG 24 hr tablet TAKE ONE TABLET  BY MOUTH DAILY 90 tablet 2   losartan (COZAAR) 25 MG tablet TAKE ONE TABLET BY MOUTH AT BEDTIME 90 tablet 1   lovastatin (MEVACOR) 40 MG tablet TAKE TWO TABLETS BY MOUTH AT BEDTIME 180 tablet 1   meclizine (ANTIVERT) 25 MG tablet Take 25 mg by mouth 3 (three) times daily as needed for dizziness.     Naphazoline-Pheniramine (OPCON-A OP) Place 1 drop into both eyes 3 (three) times daily as needed (itchy eyes.).      nitroGLYCERIN (NITROSTAT) 0.4 MG SL tablet Place 1 tablet (0.4 mg total) under the tongue every 5 (five) minutes as needed for chest pain. 25 tablet 3   NOVOLIN N 100 UNIT/ML injection Inject 1.6-1.7 mLs (160-170 Units total) into the skin at bedtime. 100 mL 0   NOVOLIN R 100 UNIT/ML injection INJECT 140 UNITS IN THE MORNING, 140 UNITS IN  THE AFTERNOON, AND 160-180 UNITS IN THE EVENING. USE BEFORE A MEAL 140 mL 1   ondansetron (ZOFRAN) 4 MG tablet Take 1 tablet (4 mg total) by mouth 3 (three) times daily as needed for nausea. 20 tablet 0   Potassium Citrate 15 MEQ (1620 MG) TBCR Take 1 tablet by mouth 3 (three) times daily. Takes 2 tablets daily     Semaglutide,0.25 or 0.5MG /DOS, (OZEMPIC, 0.25 OR 0.5 MG/DOSE,) 2 MG/3ML SOPN 0.5 mg by Other route once a week. Inject 0.25 mg weekly x 4 weeks, then increase to 0.5 mg weekly (Patient taking differently: Inject 0.5 mg into the skin once a week.) 3 mL 6   Sennosides-Docusate Sodium (STOOL SOFTENER & LAXATIVE PO) Take 2 tablets by mouth at bedtime.     Syringe/Needle, Disp, (SYRINGE 3CC/22GX1-1/2") 22G X 1-1/2" 3 ML MISC Use to administer monthly b12 shots 50 each 0   No facility-administered medications prior to visit.     Per HPI unless specifically indicated in ROS section below Review of Systems  Objective:  BP 128/80   Pulse 60   Temp (!) 97.3 F (36.3 C) (Temporal)   Ht 5' 3.25" (1.607 m)   Wt 253 lb 8 oz (115 kg)   SpO2 98%   BMI 44.55 kg/m   Wt Readings from Last 3 Encounters:  08/19/22 253 lb 8 oz (115 kg)  07/02/22 252 lb 6 oz (114.5 kg)  06/24/22 254 lb 6 oz (115.4 kg)      Physical Exam Vitals and nursing note reviewed.  Constitutional:      Appearance: Normal appearance. He is not ill-appearing.  Musculoskeletal:        General: Swelling and tenderness present.     Right lower leg: Edema (tr) present.     Left lower leg: Edema (tr) present.       Legs:     Comments:  Point tender to palpation medial leg below knee with mild soft tissue swelling without bruising, erythema or warmth.  No pain with flexion/extension at knee No popliteal fullness No patellar tendon pain  Skin:    General: Skin is warm and dry.     Findings: No erythema or rash.  Neurological:     Mental Status: He is alert.  Psychiatric:        Mood and Affect: Mood normal.         Behavior: Behavior normal.       Lab Results  Component Value Date   HGBA1C 7.6 (H) 06/25/2022   Lab Results  Component Value Date   LABURIC 5.8 06/25/2022    Lab Results  Component Value Date   NA 143 06/25/2022   CL 102 06/25/2022   K 4.3 06/25/2022   CO2 32 06/25/2022   BUN 29 (H) 06/25/2022   CREATININE 1.51 (H) 06/25/2022   GFR 45.22 (L) 06/25/2022   CALCIUM 9.1 06/25/2022   PHOS 3.6 06/25/2022   ALBUMIN 4.1 06/25/2022   GLUCOSE 88 06/25/2022    Assessment & Plan:   Problem List Items Addressed This Visit     Left leg pain - Primary    Left medial leg pain anticipate pes anserine bursitis.  Check films today r/o tibial fracture - no obvious fracture on my review, evidence of arterial calcifications.  In recent long car ride, check D dimer although discussed unlikely to be DVT related.  In gout history, rec trial colchicine for a few days, update urate levels.  Update if not improving with treatment.  Avoid oral NSAIDs in CKD.       Relevant Orders   DG Tibia/Fibula Left   D-dimer, quantitative   Uric acid     No orders of the defined types were placed in this encounter.   Orders Placed This Encounter  Procedures   DG Tibia/Fibula Left    Standing Status:   Future    Number of Occurrences:   1    Standing Expiration Date:   08/19/2023    Order Specific Question:   Reason for Exam (SYMPTOM  OR DIAGNOSIS REQUIRED)    Answer:   left medial upper leg pain at tibia below knee after prolonged walking, no trauma    Order Specific Question:   Preferred imaging location?    Answer:   [facility]   D-dimer, quantitative   Uric acid    Patient Instructions  Left leg xray today  Labs today to check for blood clot - if elevated will proceed with venous ultrasound. Continue treatment as up to now including heating pad, ice, voltaren gel.  Also checking gout levels - take colchicine daily for next 2-3 days to see if any benefit as well.   Follow up  plan: Return if symptoms worsen or fail to improve.  [name], MD

## 2022-08-19 NOTE — Progress Notes (Signed)
08/19/2022 Name: Donald [name] MRN: [id number] DOB: 1945-08-24  Chief Complaint  Patient presents with   Medication Management   Diabetes   Hypertension   Hyperlipidemia    Donald [name] is a 77 y.o. year old male who presented for a telephone visit.   They were referred to the pharmacist by their PCP for assistance in managing diabetes, hypertension, and hyperlipidemia.    Subjective:  Care Team: Primary Care Provider: [name], MD ; Next Scheduled Visit: 01/01/23 Endocrinologist Shamleffer; Next Scheduled Visit: 09/13/22  Medication Access/Adherence  Current Pharmacy:  [facility] - [place], [place] - [address] [place] [address] Phone: [phone] Fax: [phone]  [facility] [address] [place] - [address] [place] [address] Phone: [phone] Fax: [phone]   Patient reports affordability concerns with their medications: Yes  Patient reports access/transportation concerns to their pharmacy: No  Patient reports adherence concerns with their medications:  No    Seen by PCP today for leg swelling. Awaiting labs.   Diabetes:  Current medications: Ozempic 0.5 mg weekly, Novolin N 160-170 units daily, Novolin R 140-160 units before meals  Medications tried in the past: metformin - renal function inappropriate for initiation;   Reports he is not interested in [place] for renal protection at this time. He is concerned about the side effects he read. Given current glycemic control, he may be at a higher risk of glucosuria-related side effects if Marcelline Deist was to be started now.   Date of Download: 7/23-08/19/22 % Time CGM is active: 85% Average Glucose: 195 mg/dL Glucose Management Indicator: 8.0  Glucose Variability: 35.9 (goal <36%) Time in Goal:  - Time in range 70-180: 43% - Time above range: 56% - Time below range: 1%     Patient reports hypoglycemic s/sx including  dizziness, shakiness, sweating when glucose is in low 100s.  Current medication access support: notes insulin is $35  Hypertension:  Current medications: losartan 25 mg daily, doxazosin 1 mg twice daily, furosemide 80 mg daily  Patient has a validated, automated, upper arm home BP cuff Current blood pressure readings readings: 120s/70-80s at home  Patient reports hypotensive s/sx including dizziness, lightheadedness if he moves too quickly  Hyperlipidemia/ASCVD Risk Reduction  Current lipid lowering medications: lovastatin 40 mg daily  Objective:  Lab Results  Component Value Date   HGBA1C 7.6 (H) 06/25/2022    Lab Results  Component Value Date   CREATININE 1.51 (H) 06/25/2022   BUN 29 (H) 06/25/2022   NA 143 06/25/2022   K 4.3 06/25/2022   CL 102 06/25/2022   CO2 32 06/25/2022    Lab Results  Component Value Date   CHOL 93 06/25/2022   HDL 31.00 (L) 06/25/2022   LDLCALC 43 06/25/2022   LDLDIRECT 52.2 05/25/2009   TRIG 96.0 06/25/2022   CHOLHDL 3 06/25/2022    Medications Reviewed Today     Reviewed by Donald [name], RPH-CPP (Pharmacist) on 08/19/22 at 1609  Med List Status: <None>   Medication Order Taking? Sig Documenting Provider Last Dose Status Informant  albuterol (VENTOLIN HFA) 108 (90 Base) MCG/ACT inhaler [id number]  Inhale 2 puffs into the lungs every 6 (six) hours as needed for wheezing or shortness of breath. [name], MD  Active Self  allopurinol (ZYLOPRIM) 300 MG tablet [id number]  TAKE ONE TABLET BY MOUTH EVERY DAY [name], MD  Active   ALPRAZolam Prudy Feeler) 1 MG tablet [id number] Yes TAKE 1  TABLET BY MOUTH AT BEDTIME IF NEEDED FOR SLEEP. [name], MD Taking Active   aspirin 81 MG EC tablet [id number] Yes Take 81 mg by mouth daily. Swallow whole. [provider] Taking Active   Cholecalciferol (VITAMIN D3) 25 MCG (1000 UT) CAPS [id number] Yes Take 1 capsule (1,000 Units total) by mouth daily. [name], MD  Taking Active Self  colchicine 0.6 MG tablet [id number] Yes TAKE 1 TABLET BY MOUTH DAILY AS NEEDED FOR GOUT GLARE. ON FIRST DAY OF GOUT FLARE,MAY TAKE 2 TABLETS AT ONCE AS DIRECTED [name], MD Taking Active   Continuous Glucose Sensor (FREESTYLE LIBRE 2 SENSOR) [place] [id number] Yes 1 Device by Does not apply route every 14 (fourteen) days. Shamleffer, Konrad Dolores, MD Taking Active   cyanocobalamin (VITAMIN B12) 1000 MCG/ML injection [id number] Yes INJECT INTRAMUSCULARLY EVERY 30 DAYS [name], MD Taking Active   cyclobenzaprine (FLEXERIL) 10 MG tablet [id number]  Take 10 mg by mouth 3 (three) times daily as needed for muscle spasms. [provider]  Active Self  doxazosin (CARDURA) 1 MG tablet [id number] Yes TAKE ONE TABLET BY MOUTH TWICE DAILY Gollan, Tollie Pizza, MD Taking Active   fluticasone (FLONASE) 50 MCG/ACT nasal spray [id number]  Place 2 sprays into both nostrils daily. Emi Belfast, FNP  Active Self  furosemide (LASIX) 80 MG tablet [id number] Yes TAKE 1 TABLET BY MOUTH EVERY DAY. MAY TAKE AN EXTRA PILL FOR SWELLING. Antonieta Iba, MD Taking Active   gabapentin (NEURONTIN) 600 MG tablet [id number] Yes TAKE TWO TABLETS AT BEDTIME [name], MD Taking Active   glucose blood (GE100 BLOOD GLUCOSE TEST) test strip [id number] Yes TEST FOUR TIMES DAILY Shamleffer, Konrad Dolores, MD Taking Active   HYDROcodone-acetaminophen [facility]) 10-325 MG tablet [id number]  Take 1 tablet by mouth every 6 (six) hours as needed for moderate pain ((typically once to twice daily if needed)).  [name], MD  Active Self  isosorbide mononitrate (IMDUR) 30 MG 24 hr tablet [id number] Yes TAKE ONE TABLET BY MOUTH DAILY [name], Tollie Pizza, MD Taking Active   losartan (COZAAR) 25 MG tablet [id number] Yes TAKE ONE TABLET BY MOUTH AT BEDTIME [name], MD Taking Active   lovastatin (MEVACOR) 40 MG tablet [id number] Yes TAKE TWO TABLETS BY MOUTH AT BEDTIME Antonieta Iba,  MD Taking Active   meclizine (ANTIVERT) 25 MG tablet [id number] No Take 25 mg by mouth 3 (three) times daily as needed for dizziness.  Patient not taking: Reported on 08/19/2022   [provider] Not Taking Active   Naphazoline-Pheniramine [facility] OP) [id number]  Place 1 drop into both eyes 3 (three) times daily as needed (itchy eyes.).  [provider]  Active Self  nitroGLYCERIN (NITROSTAT) 0.4 MG SL tablet [id number]  Place 1 tablet (0.4 mg total) under the tongue every 5 (five) minutes as needed for chest pain. Antonieta Iba, MD  Active   NOVOLIN N 100 UNIT/ML injection [id number] Yes Inject 1.6-1.7 mLs (160-170 Units total) into the skin at bedtime. Shamleffer, Konrad Dolores, MD Taking Active   NOVOLIN R 100 UNIT/ML injection [id number] Yes INJECT 140 UNITS IN THE MORNING, 140 UNITS IN THE AFTERNOON, AND 160-180 UNITS IN THE EVENING. USE BEFORE A MEAL Shamleffer, Konrad Dolores, MD Taking Active   ondansetron (ZOFRAN) 4 MG tablet [id number]  Take 1 tablet (4 mg total) by mouth 3 (three) times daily as needed for nausea. [name], MD  Active   Potassium Citrate 15 MEQ (1620 MG) TBCR [id number] Yes Take 1  tablet by mouth 3 (three) times daily. Takes 2 tablets daily [provider] Taking Active   Semaglutide,0.25 or 0.5MG /DOS, (OZEMPIC, 0.25 OR 0.5 MG/DOSE,) 2 MG/3ML SOPN [id number] Yes 0.5 mg by Other route once a week. Inject 0.25 mg weekly x 4 weeks, then increase to 0.5 mg weekly  Patient taking differently: Inject 0.5 mg into the skin once a week.   Shamleffer, Konrad Dolores, MD Taking Active   Sennosides-Docusate Sodium (STOOL SOFTENER & LAXATIVE PO) [id number]  Take 2 tablets by mouth at bedtime. [provider]  Active Self  Syringe/Needle, Disp, (SYRINGE 3CC/22GX1-1/2") 22G X 1-1/2" 3 ML MISC [id number] Yes Use to administer monthly b12 shots [name], MD Taking Active Self  Med List Note Cardell Peach 10/12/18 1538): cpap  machine at bedtime              Assessment/Plan:   Diabetes and CKD: - Currently uncontrolled - Encouraged to discuss Ozempic dose increase with Dr. Lonzo Cloud later this month. Could also consider basal insulin transition to [place] U200 - discussed income and patient would qualify for [employer] assistance. Will notify Dr. Lonzo Cloud for future consideration.  - Recommend to continue current regimen. Discussed benefit of Farxiga in CKD, but would consider achieving better glycemic control before addition. Patient is over income for [place] assistance from [employer], but does meet income requirements for [employer], so he could get [place] for $0.  Hypertension: - Currently controlled - Reviewed long term cardiovascular and renal outcomes of uncontrolled blood pressure - Reviewed appropriate blood pressure monitoring technique and reviewed goal blood pressure. Recommended to check home blood pressure and heart rate periodically - Monitor for symptoms of sedation, dizziness with doxazosin. Patient counseled on symptoms.  - Recommend to continue current regimen at this time, continue to follow with cardiology, nephrology  Hyperlipidemia/ASCVD Risk Reduction: - Currently controlled.  - Recommend to continue current regimen at this time  Follow Up Plan: follow up with Endo as scheduled for DM management  Catie Eppie Gibson, PharmD, BCACP, CPP Clinical Pharmacist [facility] [phone]

## 2022-08-19 NOTE — Patient Instructions (Signed)
[name],   It was great talking to you today!  Talk to Dr. Lonzo [name] about increasing Ozempic.   Let us know if you change your mind on Farxiga. There is a different assistance program that would allow you to get the medication at your pharmacy for $0.   Check your blood pressure periodically, and any time you have concerning symptoms like headache, chest pain, dizziness, shortness of breath, or vision changes.   Our goal is less than 130/80.  To appropriately check your blood pressure, make sure you do the following:  1) Avoid caffeine, exercise, or tobacco products for 30 minutes before checking. Empty your bladder. 2) Sit with your back supported in a flat-backed chair. Rest your arm on something flat (arm of the chair, table, etc). 3) Sit still with your feet flat on the floor, resting, for at least 5 minutes.  4) Check your blood pressure. Take 1-2 readings.  5) Write down these readings and bring with you to any provider appointments.  Bring your home blood pressure machine with you to a provider's office for accuracy comparison at least once a year.   Make sure you take your blood pressure medications before you come to any office visit, even if you were asked to fast for labs.   Take care!  Donald [name], PharmD, BCACP, CPP Clinical Pharmacist [facility] [phone]

## 2022-08-19 NOTE — Patient Instructions (Addendum)
Left leg xray today  Labs today to check for blood clot - if elevated will proceed with venous ultrasound. Continue treatment as up to now including heating pad, ice, voltaren gel.  Also checking gout levels - take colchicine daily for next 2-3 days to see if any benefit as well.

## 2022-08-19 NOTE — Assessment & Plan Note (Addendum)
Left medial leg pain anticipate pes anserine bursitis.  Check films today r/o tibial fracture - no obvious fracture on my review, evidence of arterial calcifications.  In recent long car ride, check D dimer although discussed unlikely to be DVT related.  In gout history, rec trial colchicine for a few days, update urate levels.  Update if not improving with treatment.  Avoid oral NSAIDs in CKD.

## 2022-08-20 ENCOUNTER — Other Ambulatory Visit: Payer: Medicare Other

## 2022-08-20 ENCOUNTER — Ambulatory Visit
Admission: RE | Admit: 2022-08-20 | Discharge: 2022-08-20 | Disposition: A | Discharge status: Home / Self Care | Payer: Medicare Other | Source: Ambulatory Visit | Attending: Family Medicine | Admitting: Family Medicine

## 2022-08-20 ENCOUNTER — Other Ambulatory Visit: Payer: Self-pay | Admitting: Family Medicine

## 2022-08-20 DIAGNOSIS — R7989 Other specified abnormal findings of blood chemistry: Secondary | ICD-10-CM

## 2022-08-20 DIAGNOSIS — M79605 Pain in left leg: Secondary | ICD-10-CM | POA: Diagnosis not present

## 2022-08-21 ENCOUNTER — Inpatient Hospital Stay: Admission: RE | Admit: 2022-08-21 | Payer: Medicare Other | Source: Ambulatory Visit

## 2022-08-29 ENCOUNTER — Other Ambulatory Visit: Payer: Self-pay | Admitting: Internal Medicine

## 2022-09-13 ENCOUNTER — Encounter: Payer: Self-pay | Admitting: Internal Medicine

## 2022-09-13 ENCOUNTER — Ambulatory Visit: Payer: Medicare Other | Admitting: Internal Medicine

## 2022-09-13 VITALS — BP 125/50 | Ht 63.0 in | Wt 249.2 lb

## 2022-09-13 DIAGNOSIS — Z794 Long term (current) use of insulin: Secondary | ICD-10-CM | POA: Diagnosis not present

## 2022-09-13 DIAGNOSIS — E1165 Type 2 diabetes mellitus with hyperglycemia: Secondary | ICD-10-CM

## 2022-09-13 DIAGNOSIS — E1122 Type 2 diabetes mellitus with diabetic chronic kidney disease: Secondary | ICD-10-CM | POA: Diagnosis not present

## 2022-09-13 DIAGNOSIS — Z7985 Long-term (current) use of injectable non-insulin antidiabetic drugs: Secondary | ICD-10-CM

## 2022-09-13 DIAGNOSIS — N1832 Chronic kidney disease, stage 3b: Secondary | ICD-10-CM

## 2022-09-13 DIAGNOSIS — E1142 Type 2 diabetes mellitus with diabetic polyneuropathy: Secondary | ICD-10-CM

## 2022-09-13 LAB — POCT GLYCOSYLATED HEMOGLOBIN (HGB A1C): Hemoglobin A1C: 8 % — AB (ref 4.0–5.6)

## 2022-09-13 MED ORDER — FREESTYLE LIBRE 2 SENSOR MISC: 1.0000 | 3 refills | Status: DC

## 2022-09-13 MED ORDER — SEMAGLUTIDE (1 MG/DOSE) 4 MG/3ML ~~LOC~~ SOPN: 1.0000 mg | PEN_INJECTOR | SUBCUTANEOUS | 3 refills | Status: DC

## 2022-09-13 MED ORDER — INSULIN REGULAR HUMAN 100 UNIT/ML IJ SOLN: 200.0000 [IU] | Freq: Three times a day (TID) | INTRAMUSCULAR | 11 refills | Status: DC

## 2022-09-13 NOTE — Patient Instructions (Addendum)
Increase Ozempic 1 mg weekly  Novolin-N 140 units at  bedtime  Novolin-R  120-130 units  before brunch and Supper  Novolin-R correctional insulin: ADD extra units on insulin to your meal-time Novolin- R dose if your blood sugars are higher than 155. Use the scale below to help guide you:   Blood sugar before meal Number of units to inject  Less than 140 0 unit  141- 150 1 units  151 - 160 2 units  161- 170 3 units  171 - 180 4 units  181 - 190 5 units  191- 200 6 units  201 - 210 7 units   201 - 220 8 units      HOW TO TREAT LOW BLOOD SUGARS (Blood sugar LESS THAN 70 MG/DL) Please follow the RULE OF 15 for the treatment of hypoglycemia treatment (when your (blood sugars are less than 70 mg/dL)   STEP 1: Take 15 grams of carbohydrates when your blood sugar is low, which includes:  3-4 GLUCOSE TABS  OR 3-4 OZ OF JUICE OR REGULAR SODA OR ONE TUBE OF GLUCOSE GEL    STEP 2: RECHECK blood sugar in 15 MINUTES STEP 3: If your blood sugar is still low at the 15 minute recheck --> then, go back to STEP 1 and treat AGAIN with another 15 grams of carbohydrates.

## 2022-09-13 NOTE — Progress Notes (Signed)
Name: Donald [name]  Age/ Sex: 77 y.o., male   MRN/ DOB: [id number], July 13, 1945     PCP: [name], MD   Reason for Endocrinology Evaluation: Type 2 Diabetes Mellitus  Initial Endocrine Consultative Visit: 12/25/2011    PATIENT IDENTIFIER: Donald [name] is a 77 y.o. male with a past medical history of DM, CAD, Asthma, OSA and Dyslipidemia . The patient has followed with Endocrinology clinic since 12/25/2011 for consultative assistance with management of his diabetes.  DIABETIC HISTORY:  Donald [name] was diagnosed with DM 1989.  Pioglitazone-swelling, Metformin - low GFR, he has been on insulin since 1991. His hemoglobin A1c has ranged from 6.9% in 2019, peaking at 10.1% in 2012.   He was followed by Dr. Everardo All from 2013 until 04/2021  Ozempic  started by PCP 02/2022   On his initial visit with me 03/2022 his A1c was 7.4% he was just started on Ozempic by his PCP, with continued NPH and regular insulin and was provided with a correction scale   SUBJECTIVE:   During the last visit (04/02/2022): A1c 7.4%    Today (09/13/2022): Donald [name] is here for follow-up on diabetes management.  He checks his blood sugars 2-3 times daily. The patient has  had hypoglycemic episodes since the last clinic visit, which typically occur at night . The patient is  symptomatic with these episodes    He continues to follow-up with cardiology for CAD 06/2022 He was seen by podiatry 06/13/2022 Has chronic back pain , receives intra-articular injections   Per pharmacist patient qualifies for patient assistance for GLP-1 agonist  Denies nausea or vomiting  Has occasional constipation but no diarrhea   He has pain of  leg and left knee , which has affected his sleep    HOME DIABETES REGIMEN:  Ozempic 0.5 mg weekly  Novolin-N 140-170 units   bedtime  Novolin-R  140 before brunch and Supper  CF: Novolin-R (BG-130/25)     Statin: Yes ACE-I/ARB: Yes    CONTINUOUS GLUCOSE  MONITORING RECORD INTERPRETATION    Dates of Recording: 8/17 - 09/13/2022  Sensor description: [employer]  Results statistics:   CGM use % of time 79  Average and SD 202/28.4  Time in range     36   %  % Time Above 180 43  % Time above 250 21  % Time Below target 0   Glycemic patterns summary: BGs are high during the day and night  Hyperglycemic episodes postprandial  Hypoglycemic episodes occurred N/A  Overnight periods: Variable    DIABETIC COMPLICATIONS: Microvascular complications:  CKD, neuropathy Denies:  Last Eye Exam: Completed   Macrovascular complications:  CAD Denies:  CVA, PVD   HISTORY:  Past Medical History:  Past Medical History:  Diagnosis Date   (HFpEF) heart failure with preserved ejection fraction (HCC)    a. 09/2018 Echo: EF 60-65%. PASP . Mild-mod LAE. Mild MR/TR.   ANEMIA-IRON DEFICIENCY 07/26/2006   Anxiety    ASTHMA 07/26/2006   Asthma    Back pain, chronic    Bell's palsy    CKD (chronic kidney disease), stage III (HCC)    COLONIC POLYPS, HX OF 07/26/2006   COVID-19 virus infection 01/2020   DEPRESSION 03/14/2009   DIABETES MELLITUS, TYPE II 07/26/2006   DISC DISEASE, LUMBAR 10/05/2007   DVT 12/03/2007   DYSLIPIDEMIA 04/13/2009   Dyspnea    when gets up and walks around and back is hurting really bad -only Shortness of breath  then   GERD 07/26/2006   Gout    Heart murmur    History of kidney stones    HYPERTENSION 07/26/2006   INSOMNIA 08/21/2007   Neuropathy    Nonobstructive CAD (coronary artery disease)    a. 2012 Cath: no high grade stenosis; b. 2018 MV: No ischemia. Attenuation artifact.    OBSTRUCTIVE SLEEP APNEA 12/03/2007   Use C-PAP   PERIPHERAL NEUROPATHY 07/26/2006   Pernicious anemia 11/20/2006   PULMONARY EMBOLISM 10/05/2007   Vertigo    Past Surgical History:  Past Surgical History:  Procedure Laterality Date   APPENDECTOMY  1968   BACK SURGERY  1977, 06/14/2008   Dr. Dutch Quint at [facility] [phone])   CARDIAC CATHETERIZATION     CARPAL TUNNEL RELEASE Right 10/2017   CATARACT EXTRACTION W/PHACO Left 05/30/2014   Procedure: CATARACT EXTRACTION PHACO AND INTRAOCULAR LENS PLACEMENT (IOC);  Surgeon: Sallee Lange, MD;  Location: [facility] ORS;  Service: Ophthalmology;  Laterality: Left;  [name] 01:20 AP% 23.7 CDE 31.86   CATARACT EXTRACTION W/PHACO Right 09/06/2021   Procedure: CATARACT EXTRACTION PHACO AND INTRAOCULAR LENS PLACEMENT (IOC) RIGHT DIABETIC VISION BLUE OMIDRIA MALYUGIN IRIS HOOKS;  Surgeon: Estanislado Pandy, MD;  Location: [facility];  Service: Ophthalmology;  Laterality: Right;  14.65 2.11.1   COLONOSCOPY  10/2002   HP, SSA, TA, rpt 3 yrs ([place])   COLONOSCOPY  01/2005   diverticulosis rpt 5 yrs (Medoff)    EPIDURAL BLOCK INJECTION Bilateral 06/2021   bilat L5 nerve block with benefit ([place])   IR NEPHROSTOMY PLACEMENT LEFT  10/19/2018   IR URETERAL STENT LEFT NEW ACCESS W/O SEP NEPHROSTOMY CATH  10/19/2018   LEFT HEART CATH AND CORONARY ANGIOGRAPHY N/A 11/01/2020   Procedure: LEFT HEART CATH AND CORONARY ANGIOGRAPHY;  Surgeon: Yvonne Kendall, MD;  Location: [facility];  Service: Cardiovascular;  Laterality: N/A;   LITHOTRIPSY     X 2   NEPHROLITHOTOMY Left 10/19/2018   Procedure: NEPHROLITHOTOMY PERCUTANEOUS;  Surgeon: Marcine Matar, MD;  Location: [facility];  Service: Urology;  Laterality: Left;  3 HRS   PARATHYROIDECTOMY Right 04/18/2016   PARATHYROIDECTOMY for cyst Bud Face, MD)   SHOULDER ARTHROSCOPY W/ ROTATOR CUFF REPAIR Right    TONSILLECTOMY     Social History:  reports that he has never smoked. He has never used smokeless tobacco. He reports that he does not drink alcohol and does not use drugs. Family History:  Family History  Problem Relation Age of Onset   Cancer Mother        Breast Cancer   Cancer Sister        Breast Cancer   Diabetes Father    Heart disease Father    Diabetes Paternal Grandfather       HOME MEDICATIONS: Allergies as of 09/13/2022       Reactions   Codeine Other (See Comments)   REACTION: chest pain   Pioglitazone Other (See Comments)   REACTION to Actos: swelling in ankles        Medication List        Accurate as of September 13, 2022 12:29 PM. If you have any questions, ask your nurse or doctor.          albuterol 108 (90 Base) MCG/ACT inhaler Commonly known as: VENTOLIN HFA Inhale 2 puffs into the lungs every 6 (six) hours as needed for wheezing or shortness of breath.   allopurinol 300 MG tablet Commonly known as: ZYLOPRIM TAKE ONE TABLET BY MOUTH  EVERY DAY   ALPRAZolam 1 MG tablet Commonly known as: XANAX TAKE 1 TABLET BY MOUTH AT BEDTIME IF NEEDED FOR SLEEP.   aspirin EC 81 MG tablet Take 81 mg by mouth daily. Swallow whole.   colchicine 0.6 MG tablet TAKE 1 TABLET BY MOUTH DAILY AS NEEDED FOR GOUT GLARE. ON FIRST DAY OF GOUT FLARE,MAY TAKE 2 TABLETS AT ONCE AS DIRECTED   cyanocobalamin 1000 MCG/ML injection Commonly known as: VITAMIN B12 INJECT INTRAMUSCULARLY EVERY 30 DAYS   cyclobenzaprine 10 MG tablet Commonly known as: FLEXERIL Take 10 mg by mouth 3 (three) times daily as needed for muscle spasms.   doxazosin 1 MG tablet Commonly known as: CARDURA TAKE ONE TABLET BY MOUTH TWICE DAILY   fluticasone 50 MCG/ACT nasal spray Commonly known as: FLONASE Place 2 sprays into both nostrils daily.   FreeStyle Libre 2 Sensor Misc 1 Device by Does not apply route every 14 (fourteen) days.   furosemide 80 MG tablet Commonly known as: LASIX TAKE 1 TABLET BY MOUTH EVERY DAY. MAY TAKE AN EXTRA PILL FOR SWELLING.   gabapentin 600 MG tablet Commonly known as: NEURONTIN TAKE TWO TABLETS AT BEDTIME   GE100 Blood Glucose Test test strip Generic drug: glucose blood TEST FOUR TIMES DAILY   HYDROcodone-acetaminophen 10-325 MG tablet Commonly known as: NORCO Take 1 tablet by mouth every 6 (six) hours as needed for moderate pain  ((typically once to twice daily if needed)).   isosorbide mononitrate 30 MG 24 hr tablet Commonly known as: IMDUR TAKE ONE TABLET BY MOUTH DAILY   losartan 25 MG tablet Commonly known as: COZAAR TAKE ONE TABLET BY MOUTH AT BEDTIME   lovastatin 40 MG tablet Commonly known as: MEVACOR TAKE TWO TABLETS BY MOUTH AT BEDTIME   meclizine 25 MG tablet Commonly known as: ANTIVERT Take 25 mg by mouth 3 (three) times daily as needed for dizziness.   nitroGLYCERIN 0.4 MG SL tablet Commonly known as: NITROSTAT Place 1 tablet (0.4 mg total) under the tongue every 5 (five) minutes as needed for chest pain.   NovoLIN N 100 UNIT/ML injection Generic drug: insulin NPH Human INJECT 160-170 UNITS TOTAL INTO THE SKIN AT BEDTIME   NovoLIN R 100 UNIT/ML injection Generic drug: insulin regular INJECT 140 UNITS IN THE MORNING, 140 UNITS IN THE AFTERNOON, AND 160-180 UNITS IN THE EVENING. USE BEFORE A MEAL   ondansetron 4 MG tablet Commonly known as: ZOFRAN Take 1 tablet (4 mg total) by mouth 3 (three) times daily as needed for nausea.   OPCON-A OP Place 1 drop into both eyes 3 (three) times daily as needed (itchy eyes.).   Ozempic (0.25 or 0.5 MG/DOSE) 2 MG/3ML Sopn Generic drug: Semaglutide(0.25 or 0.5MG /DOS) 0.5 mg by Other route once a week. Inject 0.25 mg weekly x 4 weeks, then increase to 0.5 mg weekly What changed:  how to take this additional instructions   Potassium Citrate 15 MEQ (1620 MG) Tbcr Take 1 tablet by mouth 3 (three) times daily. Takes 2 tablets daily   STOOL SOFTENER & LAXATIVE PO Take 2 tablets by mouth at bedtime.   SYRINGE 3CC/22GX1-1/2" 22G X 1-1/2" 3 ML Misc Use to administer monthly b12 shots   Vitamin D3 25 MCG (1000 UT) Caps Take 1 capsule (1,000 Units total) by mouth daily.         OBJECTIVE:   Vital Signs: BP (!) 125/50   Ht 5\' 3"  (1.6 m)   Wt 249 lb 3.2 oz (113 kg)  SpO2 98%   BMI 44.14 kg/m   Wt Readings from Last 3 Encounters:  09/13/22  249 lb 3.2 oz (113 kg)  08/19/22 253 lb 8 oz (115 kg)  07/02/22 252 lb 6 oz (114.5 kg)      Exam: General: Pt appears well and is in NAD  Lungs: Clear with good BS bilat   Heart: RRR   Extremities: Trace  pretibial edema.   Neuro: MS is good with appropriate affect, pt is alert and Ox3   Dm Foot Exam per podiatry 06/13/2022   DATA REVIEWED:  Lab Results  Component Value Date   HGBA1C 8.0 (A) 09/13/2022   HGBA1C 7.6 (H) 06/25/2022   HGBA1C 7.4 (A) 04/01/2022    Latest Reference Range & Units 06/25/22 10:58  Sodium 135 - 145 mEq/L 143  Potassium 3.5 - 5.1 mEq/L 4.3  Chloride 96 - 112 mEq/L 102  CO2 19 - 32 mEq/L 32  Glucose 70 - 99 mg/dL 88  BUN 6 - 23 mg/dL 29 (H)  Creatinine 4.03 - 1.50 mg/dL 4.74 (H)  Calcium 8.4 - 10.5 mg/dL 9.1  Phosphorus 2.3 - 4.6 mg/dL 3.6  Alkaline Phosphatase 39 - 117 U/L 88  Albumin 3.5 - 5.2 g/dL 4.1  Uric Acid, Serum 4.0 - 7.8 mg/dL 5.8  AST 0 - 37 U/L 11  ALT 0 - 53 U/L 11  Total Protein 6.0 - 8.3 g/dL 6.3  Total Bilirubin 0.2 - 1.2 mg/dL 1.2  GFR >25.95 mL/min 45.22 (L)    Latest Reference Range & Units 06/25/22 10:58  Total CHOL/HDL Ratio  3  Cholesterol 0 - 200 mg/dL 93  HDL Cholesterol >63.87 mg/dL 56.43 (L)  LDL (calc) 0 - 99 mg/dL 43  MICROALB/CREAT RATIO 0.0 - 30.0 mg/g 33.1 (H)  NonHDL  62.00  Triglycerides 0.0 - 149.0 mg/dL 32.9  VLDL 0.0 - 51.8 mg/dL 84.1  (L): Data is abnormally low (H): Data is abnormally high  Old records , labs and images have been reviewed.   ASSESSMENT / PLAN / RECOMMENDATIONS:   1) Type 2 Diabetes Mellitus, Poorly controlled, With neuropathic, CKD III complications - Most recent A1c of 8.0 %. Goal A1c < 7.0 %.    -Despite losing 12 obese while on Ozempic, his A1c has increased from 7.6% to 8.0% -Patient has been noted with hypoglycemia through the day and the night, he continues to guesstimate on his NPH and regular insulin, he has not been using correction scale provided in the past -This  morning he took 40 units of regular insulin for correction?  With no meal, no hypoglycemia -I will change his correction scale and encouraged him to use it before each meal and bedtime if needed for regular insulin only -Patient was again advised to remain on the same dose of NPH and not to change the dose based on his bedtime BG, if his BG is elevated he should use regular insulin for correction -Will increase Ozempic as below -A prescription for freestyle Josephine Igo has been sent to [facility] electronically  MEDICATIONS: Increase Ozempic 1 mg weekly Novolin-N 140 units QHS Novolin-R 120-130 units before brunch and supper Correction scale: Novolin-R (BG -130/10)  EDUCATION / INSTRUCTIONS: BG monitoring instructions: Patient is instructed to check his blood sugars 3 times a day, before each meal. Call [facility] if: BG persistently < 70  I reviewed the Rule of 15 for the treatment of hypoglycemia in detail with the patient. Literature supplied.    2) Diabetic complications:  Eye: Does not have known diabetic retinopathy.  Neuro/ Feet: Does  have known diabetic peripheral neuropathy .  Renal: Patient does  have known baseline CKD. He   is  on an ACEI/ARB at present.     F/U in 4 months     Signed electronically by: Lyndle Herrlich, MD  [facility]  [facility] [address] [place] [place], [place] [address] Phone: [phone] FAX: [phone]   CC: [name], MD [address] [place] [address] Phone: [phone]  Fax: [phone]  Return to Endocrinology clinic as below: Future Appointments  Date Time Provider Department Center  01/01/2023 12:30 PM [name], MD [facility]  09/04/2023  2:30 PM [facility] ANNUAL WELLNESS VISIT 1 [facility]

## 2022-09-17 ENCOUNTER — Encounter: Payer: Self-pay | Admitting: Internal Medicine

## 2022-09-18 ENCOUNTER — Telehealth: Payer: Self-pay

## 2022-09-18 NOTE — Telephone Encounter (Signed)
Patient will contact [employer] to see if he needs to re apply for the Ozempic or if his application is still good.

## 2022-09-19 ENCOUNTER — Telehealth: Payer: Self-pay | Admitting: Family Medicine

## 2022-09-19 NOTE — Telephone Encounter (Signed)
Pt requested call back from [facility] regarding refill on diabetic sensors? Pt states the prev pharmacist, Lillia Abed would get sensors from [facility]. Call back # [phone]

## 2022-09-20 ENCOUNTER — Other Ambulatory Visit: Payer: Self-pay

## 2022-09-20 MED ORDER — FREESTYLE LIBRE 2 SENSOR MISC: 1.0000 | 3 refills | Status: DC

## 2022-09-20 NOTE — Telephone Encounter (Signed)
Patient will contact [facility] as prescription was sent on 09/13/22 and again today.

## 2022-09-20 NOTE — Telephone Encounter (Signed)
Endo previously sent order to [facility] for CGM. Will route to endocrinology.

## 2022-09-23 ENCOUNTER — Other Ambulatory Visit: Payer: Self-pay | Admitting: Cardiovascular Disease

## 2022-09-23 ENCOUNTER — Other Ambulatory Visit: Payer: Self-pay | Admitting: Family Medicine

## 2022-09-25 ENCOUNTER — Telehealth: Payer: Self-pay

## 2022-09-25 NOTE — Telephone Encounter (Signed)
Patient came into the office and picked up ozempic

## 2022-09-25 NOTE — Telephone Encounter (Signed)
Received pt's med shipment of Ozempic 0.25 mg, 0.5 mg (5 boxes total).   Lvm asking pt to call back. Need to notify him of above med shipment.    [Placed Ozempic (5 boxes total) in 2nd refrigerator, top shelf.]

## 2022-09-26 NOTE — Telephone Encounter (Signed)
Noted  

## 2022-10-04 ENCOUNTER — Ambulatory Visit (INDEPENDENT_AMBULATORY_CARE_PROVIDER_SITE_OTHER): Payer: Medicare Other | Admitting: Podiatry

## 2022-10-04 DIAGNOSIS — M7672 Peroneal tendinitis, left leg: Secondary | ICD-10-CM

## 2022-10-04 MED ORDER — MELOXICAM 15 MG PO TABS: 15.0000 mg | ORAL_TABLET | Freq: Every day | ORAL | 1 refills | Status: DC

## 2022-10-04 MED ORDER — METHYLPREDNISOLONE 4 MG PO TBPK: ORAL_TABLET | ORAL | 0 refills | Status: DC

## 2022-10-04 MED ORDER — BETAMETHASONE SOD PHOS & ACET 6 (3-3) MG/ML IJ SUSP
3.0000 mg | Freq: Once | INTRAMUSCULAR | Status: AC
Start: 2022-10-04 — End: 2022-10-04
  Administered 2022-10-04: 3 mg via INTRA_ARTICULAR

## 2022-10-04 NOTE — Progress Notes (Signed)
Chief Complaint  Patient presents with   Foot Pain    Left foot pain which has kept him up during the night for the past 3 days, pain level @7  night 3 during the daytime. He is requesting another injection    HPI: 77 y.o. male presenting today for follow-up evaluation of left foot pain.  PMHx uncontrolled diabetes type 2 with peripheral polyneuropathy, chronic CKD.  Patient states that the injections in the past have helped significantly.  Past Medical History:  Diagnosis Date   (HFpEF) heart failure with preserved ejection fraction (HCC)    a. 09/2018 Echo: EF 60-65%. PASP . Mild-mod LAE. Mild MR/TR.   ANEMIA-IRON DEFICIENCY 07/26/2006   Anxiety    ASTHMA 07/26/2006   Asthma    Back pain, chronic    Bell's palsy    CKD (chronic kidney disease), stage III (HCC)    COLONIC POLYPS, HX OF 07/26/2006   COVID-19 virus infection 01/2020   DEPRESSION 03/14/2009   DIABETES MELLITUS, TYPE II 07/26/2006   DISC DISEASE, LUMBAR 10/05/2007   DVT 12/03/2007   DYSLIPIDEMIA 04/13/2009   Dyspnea    when gets up and walks around and back is hurting really bad -only Shortness of breath  then   GERD 07/26/2006   Gout    Heart murmur    History of kidney stones    HYPERTENSION 07/26/2006   INSOMNIA 08/21/2007   Neuropathy    Nonobstructive CAD (coronary artery disease)    a. 2012 Cath: no high grade stenosis; b. 2018 MV: No ischemia. Attenuation artifact.    OBSTRUCTIVE SLEEP APNEA 12/03/2007   Use C-PAP   PERIPHERAL NEUROPATHY 07/26/2006   Pernicious anemia 11/20/2006   PULMONARY EMBOLISM 10/05/2007   Vertigo      Physical Exam: General: The patient is alert and oriented x3 in no acute distress.  Dermatology: Skin is warm, dry and supple bilateral lower extremities. Negative for open lesions or macerations.  Vascular: Palpable pedal pulses bilaterally. No edema or erythema noted. Capillary refill within normal limits.  Neurological: Gross intact via light  touch  Musculoskeletal Exam: Tenderness with palpation noted along the peroneal tendon sheath as it inserts on the fifth metatarsal tubercle left  Radiographic Exam LT foot 12/26/2021:  Normal osseous mineralization.  There are some joint space narrowing and articular degenerative changes of the fifth metatarsal cuboid joint.  There is a cortical irregularity along the joint.  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  It appears well-circumscribed and chronic in nature.  This does not correlate to the patient's symptoms.  No pathologic fracture identified.  There may be some chronic old pathologic fractures in the past that have healed.  Assessment: 1.  Insertional peroneal tendinitis left 2.  Cavus foot type left  Plan of Care:  -Patient evaluated -Injection of 0.5 cc Celestone Soluspan injected along the peroneal tendon sheath left -Prescription for Medrol Dosepak -History of CKD.  No NSAIDs.  Recommend exercise Tylenol as needed -Advised against going barefoot.  Recommend good supportive shoes -Return to clinic as needed      Felecia Shelling, DPM [facility]  Dr. Felecia Shelling, DPM    [address], [place] [address]  Office [phone]  Fax [phone]

## 2022-10-08 DIAGNOSIS — Z23 Encounter for immunization: Secondary | ICD-10-CM | POA: Diagnosis not present

## 2022-10-14 DIAGNOSIS — Z79899 Other long term (current) drug therapy: Secondary | ICD-10-CM | POA: Diagnosis not present

## 2022-10-14 DIAGNOSIS — T402X5A Adverse effect of other opioids, initial encounter: Secondary | ICD-10-CM | POA: Diagnosis not present

## 2022-10-14 DIAGNOSIS — Z79891 Long term (current) use of opiate analgesic: Secondary | ICD-10-CM | POA: Diagnosis not present

## 2022-10-14 DIAGNOSIS — M5416 Radiculopathy, lumbar region: Secondary | ICD-10-CM | POA: Diagnosis not present

## 2022-10-14 DIAGNOSIS — M792 Neuralgia and neuritis, unspecified: Secondary | ICD-10-CM | POA: Diagnosis not present

## 2022-10-14 DIAGNOSIS — E1140 Type 2 diabetes mellitus with diabetic neuropathy, unspecified: Secondary | ICD-10-CM | POA: Diagnosis not present

## 2022-10-14 DIAGNOSIS — M5412 Radiculopathy, cervical region: Secondary | ICD-10-CM | POA: Diagnosis not present

## 2022-10-14 DIAGNOSIS — M5136 Other intervertebral disc degeneration, lumbar region: Secondary | ICD-10-CM | POA: Diagnosis not present

## 2022-10-14 DIAGNOSIS — Z5181 Encounter for therapeutic drug level monitoring: Secondary | ICD-10-CM | POA: Diagnosis not present

## 2022-10-14 DIAGNOSIS — G894 Chronic pain syndrome: Secondary | ICD-10-CM | POA: Diagnosis not present

## 2022-10-28 ENCOUNTER — Other Ambulatory Visit: Payer: Self-pay | Admitting: Family Medicine

## 2022-10-31 DIAGNOSIS — M5416 Radiculopathy, lumbar region: Secondary | ICD-10-CM | POA: Diagnosis not present

## 2022-11-06 ENCOUNTER — Ambulatory Visit: Payer: Medicare Other | Admitting: Nurse Practitioner

## 2022-11-07 ENCOUNTER — Other Ambulatory Visit: Payer: Self-pay | Admitting: Cardiovascular Disease

## 2022-11-07 ENCOUNTER — Ambulatory Visit: Payer: Medicare Other | Admitting: Nurse Practitioner

## 2022-11-07 VITALS — BP 122/70 | HR 60 | Temp 97.7°F | Ht 63.0 in | Wt 239.6 lb

## 2022-11-07 DIAGNOSIS — Z9889 Other specified postprocedural states: Secondary | ICD-10-CM

## 2022-11-07 DIAGNOSIS — M79604 Pain in right leg: Secondary | ICD-10-CM

## 2022-11-07 MED ORDER — PREDNISONE 20 MG PO TABS
ORAL_TABLET | ORAL | 0 refills | Status: AC
Start: 2022-11-07 — End: 2022-11-15

## 2022-11-07 MED ORDER — CYCLOBENZAPRINE HCL 10 MG PO TABS
10.0000 mg | ORAL_TABLET | Freq: Every evening | ORAL | 0 refills | Status: DC | PRN
Start: 2022-11-07 — End: 2022-11-21

## 2022-11-07 NOTE — Assessment & Plan Note (Signed)
Prednisone and cyclobenzaprine as directed.  Sedation precautions reviewed

## 2022-11-07 NOTE — Assessment & Plan Note (Signed)
Presents like sciatica with negative straight leg raise.  Will treat with prednisone taper.  Avoid NSAIDs.  Patient has insulin dosing when his sugar goes up.  Told him he can reach out to endocrinology if glucose gets out of around with prednisone use.  Did refill cyclobenzaprine 10 mg nightly as needed sedation precautions reviewed.  No need for imaging at this juncture as there was no traumatic injury

## 2022-11-07 NOTE — Patient Instructions (Addendum)
Nice to see you today I have sent in prednisone and cyclobenzaprine. The muscle relaxer can cause sedation so use caution  Follow up if you do not improve

## 2022-11-07 NOTE — Progress Notes (Signed)
Acute Office Visit  Subjective:     Patient ID: Donald [name], male    DOB: Apr 23, 1945, 76 y.o.   MRN: [id number]  Chief Complaint  Patient presents with   pulled muscle    Pt states that on Saturday he bent down (from screwing in a hose pipe) and when he stood up there was pain in right side of hip down to right leg. Pt complains of pain when sitting, standing, walking. OTC did not help. Pain level 8.     HPI Patient is in today for leg pain with a history of HFpEF, asthma, Chronic back pian ,CKD, DM2, GERD, HTN, neuropathy. Back surgery.   States that Saturday he bent over to screw the hose pipe in. States that he got back up he got off balance and grabbed a tree and a few hours later he started to hurt.  States that sitting and laying it hurts. Walking makes it worse.  States that he does have a history of back pain and surgery. He has had injections approx  States that it feel liek a knife is ii Doen heat cyclobenzaprine, hydrocodone, otc pain cream without releif  Review of Systems  Constitutional:  Negative for chills and fever.  Respiratory:  Negative for shortness of breath.   Cardiovascular:  Negative for chest pain.  Musculoskeletal:  Positive for back pain and joint pain.  Neurological:  Negative for tingling, weakness and headaches.        Objective:    BP 122/70   Pulse 60   Temp 97.7 F (36.5 C) (Oral)   Ht 5\' 3"  (1.6 m)   Wt 239 lb 9.6 oz (108.7 kg)   SpO2 94%   BMI 42.44 kg/m    Physical Exam Vitals and nursing note reviewed.  Constitutional:      Appearance: Normal appearance.  Cardiovascular:     Rate and Rhythm: Normal rate and regular rhythm.     Pulses:          Posterior tibial pulses are 2+ on the right side and 2+ on the left side.     Heart sounds: Normal heart sounds.  Pulmonary:     Effort: Pulmonary effort is normal.     Breath sounds: Normal breath sounds.  Musculoskeletal:     Lumbar back: No tenderness or bony tenderness.  Negative right straight leg raise test and negative left straight leg raise test.       Legs:  Neurological:     General: No focal deficit present.     Mental Status: He is alert.     Deep Tendon Reflexes:     Reflex Scores:      Patellar reflexes are 2+ on the right side and 2+ on the left side.    Comments: Bilateral lower extremity strength 5/5     No results found for any visits on 11/07/22.      Assessment & Plan:   Problem List Items Addressed This Visit       Other   Right leg pain - Primary    Presents like sciatica with negative straight leg raise.  Will treat with prednisone taper.  Avoid NSAIDs.  Patient has insulin dosing when his sugar goes up.  Told him he can reach out to endocrinology if glucose gets out of around with prednisone use.  Did refill cyclobenzaprine 10 mg nightly as needed sedation precautions reviewed.  No need for imaging at this juncture as there was no traumatic  injury      Relevant Medications   predniSONE (DELTASONE) 20 MG tablet   cyclobenzaprine (FLEXERIL) 10 MG tablet   History of back surgery    Prednisone and cyclobenzaprine as directed.  Sedation precautions reviewed       Meds ordered this encounter  Medications   predniSONE (DELTASONE) 20 MG tablet    Sig: Take 2 tablets (40 mg total) by mouth daily with breakfast for 4 days, THEN 1 tablet (20 mg total) daily with breakfast for 4 days. Avoid NSAIDs.    Dispense:  12 tablet    Refill:  0    Order Specific Question:   Supervising Provider    Answer:   Milinda Antis, MARNE A [[id number]]   cyclobenzaprine (FLEXERIL) 10 MG tablet    Sig: Take 1 tablet (10 mg total) by mouth at bedtime as needed for muscle spasms.    Dispense:  15 tablet    Refill:  0    Order Specific Question:   Supervising Provider    Answer:   TOWER, MARNE A [[id number]]    Return if symptoms worsen or fail to improve.  Audria Nine, NP

## 2022-11-11 ENCOUNTER — Ambulatory Visit: Payer: Medicare Other | Admitting: Family Medicine

## 2022-11-21 ENCOUNTER — Ambulatory Visit: Payer: Medicare Other | Admitting: Internal Medicine

## 2022-11-21 ENCOUNTER — Encounter: Payer: Self-pay | Admitting: Internal Medicine

## 2022-11-21 VITALS — BP 132/78 | HR 72 | Temp 97.9°F | Ht 63.0 in | Wt 247.0 lb

## 2022-11-21 DIAGNOSIS — M79604 Pain in right leg: Secondary | ICD-10-CM

## 2022-11-21 MED ORDER — CYCLOBENZAPRINE HCL 10 MG PO TABS
10.0000 mg | ORAL_TABLET | Freq: Three times a day (TID) | ORAL | 0 refills | Status: DC | PRN
Start: 2022-11-21 — End: 2023-03-04

## 2022-11-21 NOTE — Assessment & Plan Note (Signed)
Ongoing severe pain Doesn't seem like HNP---and mechanism of injury didn't seem severe Spot of pain suggests muscle--but no bleeding and more severe than typical muscle Known significant lumbar spine disease----could be sacral nerve issue Cyclobenzaprine helps --will refill Still has some hydrocodone left---needs to contact Dr Ethelene Hal for evaluation

## 2022-11-21 NOTE — Patient Instructions (Signed)
Continue to use the cyclobenzaprine (but be very careful with it during the day). Okay to continue heat if it helps Please contact Dr Ethelene Hal ASAP to be evaluated by him or one of his partners.

## 2022-11-21 NOTE — Progress Notes (Signed)
Subjective:    Patient ID: Donald [name], male    DOB: Dec 05, 1945, 77 y.o.   MRN: [id number]  HPI Here due to ongoing leg pain  2 weeks ago--reached down to screw in hose pipe--with cane Lost balance and grabbed tree  About an hour later---right buttock pain that has been bad since then Heat/ice/lfexeril---no help  Seen 10/24 ---given prednisone (raised sugar a lot but didn't help) Extremely painful to walk Hard to get comfortable even sitting  Pain is along lateral upper thigh also Using diclofenac gel--not helping either (or at most just very brief)  Sees Dr Ethelene Hal for lumbar spine injections every few months Had surgery by Dr Jordan Likes some years ago  Current Outpatient Medications on File Prior to Visit  Medication Sig Dispense Refill   albuterol (VENTOLIN HFA) 108 (90 Base) MCG/ACT inhaler Inhale 2 puffs into the lungs every 6 (six) hours as needed for wheezing or shortness of breath. 8 g 0   allopurinol (ZYLOPRIM) 300 MG tablet TAKE ONE TABLET BY MOUTH EVERY DAY 90 tablet 4   ALPRAZolam (XANAX) 1 MG tablet TAKE 1 TABLET BY MOUTH AT BEDTIME IF NEEDED FOR SLEEP. (Patient taking differently: Take 0.5 mg by mouth at bedtime.) 30 tablet 0   aspirin 81 MG EC tablet Take 81 mg by mouth daily. Swallow whole.     Cholecalciferol (VITAMIN D3) 25 MCG (1000 UT) CAPS Take 1 capsule (1,000 Units total) by mouth daily. 30 capsule    colchicine 0.6 MG tablet TAKE 1 TABLET BY MOUTH DAILY AS NEEDED FOR GOUT GLARE. ON FIRST DAY OF GOUT FLARE,MAY TAKE 2 TABLETS AT ONCE AS DIRECTED 30 tablet 0   Continuous Glucose Sensor (FREESTYLE LIBRE 2 SENSOR) MISC 1 Device by Does not apply route every 14 (fourteen) days. 6 each 3   cyanocobalamin (VITAMIN B12) 1000 MCG/ML injection INJECT INTRAMUSCULARLY EVERY 30 DAYS 1 mL 6   cyclobenzaprine (FLEXERIL) 10 MG tablet Take 1 tablet (10 mg total) by mouth at bedtime as needed for muscle spasms. 15 tablet 0   doxazosin (CARDURA) 1 MG tablet TAKE ONE TABLET  BY MOUTH TWICE DAILY 180 tablet 2   fluticasone (FLONASE) 50 MCG/ACT nasal spray Place 2 sprays into both nostrils daily. 16 g 1   furosemide (LASIX) 80 MG tablet TAKE 1 TABLET BY MOUTH EVERY DAY. MAY TAKE AN EXTRA PILL FOR SWELLING. 90 tablet 1   gabapentin (NEURONTIN) 600 MG tablet TAKE TWO TABLETS AT BEDTIME 180 tablet 3   glucose blood (GE100 BLOOD GLUCOSE TEST) test strip TEST FOUR TIMES DAILY 200 each 10   HYDROcodone-acetaminophen (NORCO) 10-325 MG tablet Take 1 tablet by mouth every 6 (six) hours as needed for moderate pain ((typically once to twice daily if needed)).  30 tablet 0   insulin regular (NOVOLIN R) 100 units/mL injection Inject 2 mLs (200 Units total) into the skin 3 (three) times daily before meals. 180 mL 11   isosorbide mononitrate (IMDUR) 30 MG 24 hr tablet TAKE ONE TABLET BY MOUTH DAILY 90 tablet 2   losartan (COZAAR) 25 MG tablet TAKE ONE TABLET BY MOUTH AT BEDTIME 90 tablet 1   lovastatin (MEVACOR) 40 MG tablet TAKE TWO TABLETS BY MOUTH AT BEDTIME 180 tablet 1   meclizine (ANTIVERT) 25 MG tablet Take 25 mg by mouth 3 (three) times daily as needed for dizziness.     Naphazoline-Pheniramine (OPCON-A OP) Place 1 drop into both eyes 3 (three) times daily as needed (itchy eyes.).  nitroGLYCERIN (NITROSTAT) 0.4 MG SL tablet Place 1 tablet (0.4 mg total) under the tongue every 5 (five) minutes as needed for chest pain. 25 tablet 3   NOVOLIN N 100 UNIT/ML injection INJECT 160-170 UNITS TOTAL INTO THE SKIN AT BEDTIME 100 mL 0   ondansetron (ZOFRAN) 4 MG tablet Take 1 tablet (4 mg total) by mouth 3 (three) times daily as needed for nausea. 20 tablet 0   Potassium Citrate 15 MEQ (1620 MG) TBCR Take 1 tablet by mouth 3 (three) times daily. Takes 2 tablets daily     Semaglutide, 1 MG/DOSE, 4 MG/3ML SOPN Inject 1 mg as directed once a week. 9 mL 3   Sennosides-Docusate Sodium (STOOL SOFTENER & LAXATIVE PO) Take 2 tablets by mouth at bedtime.     Syringe/Needle, Disp, (SYRINGE  3CC/22GX1-1/2") 22G X 1-1/2" 3 ML MISC Use to administer monthly b12 shots 50 each 0   No current facility-administered medications on file prior to visit.    Allergies  Allergen Reactions   Codeine Other (See Comments)    REACTION: chest pain   Pioglitazone Other (See Comments)    REACTION to Actos: swelling in ankles    Past Medical History:  Diagnosis Date   (HFpEF) heart failure with preserved ejection fraction (HCC)    a. 09/2018 Echo: EF 60-65%. PASP . Mild-mod LAE. Mild MR/TR.   ANEMIA-IRON DEFICIENCY 07/26/2006   Anxiety    ASTHMA 07/26/2006   Asthma    Back pain, chronic    Bell's palsy    CKD (chronic kidney disease), stage III (HCC)    COLONIC POLYPS, HX OF 07/26/2006   COVID-19 virus infection 01/2020   DEPRESSION 03/14/2009   DIABETES MELLITUS, TYPE II 07/26/2006   DISC DISEASE, LUMBAR 10/05/2007   DVT 12/03/2007   DYSLIPIDEMIA 04/13/2009   Dyspnea    when gets up and walks around and back is hurting really bad -only Shortness of breath  then   GERD 07/26/2006   Gout    Heart murmur    History of kidney stones    HYPERTENSION 07/26/2006   INSOMNIA 08/21/2007   Neuropathy    Nonobstructive CAD (coronary artery disease)    a. 2012 Cath: no high grade stenosis; b. 2018 MV: No ischemia. Attenuation artifact.    OBSTRUCTIVE SLEEP APNEA 12/03/2007   Use C-PAP   PERIPHERAL NEUROPATHY 07/26/2006   Pernicious anemia 11/20/2006   PULMONARY EMBOLISM 10/05/2007   Vertigo     Past Surgical History:  Procedure Laterality Date   APPENDECTOMY  1968   BACK SURGERY  1977, 06/14/2008   Dr. Dutch Quint at [facility] [phone])   CARDIAC CATHETERIZATION     CARPAL TUNNEL RELEASE Right 10/2017   CATARACT EXTRACTION W/PHACO Left 05/30/2014   Procedure: CATARACT EXTRACTION PHACO AND INTRAOCULAR LENS PLACEMENT (IOC);  Surgeon: Sallee Lange, MD;  Location: [facility] ORS;  Service: Ophthalmology;  Laterality: Left;  [name] 01:20 AP% 23.7 CDE 31.86   CATARACT EXTRACTION  W/PHACO Right 09/06/2021   Procedure: CATARACT EXTRACTION PHACO AND INTRAOCULAR LENS PLACEMENT (IOC) RIGHT DIABETIC VISION BLUE OMIDRIA MALYUGIN IRIS HOOKS;  Surgeon: Estanislado Pandy, MD;  Location: [facility];  Service: Ophthalmology;  Laterality: Right;  14.65 2.11.1   COLONOSCOPY  10/2002   HP, SSA, TA, rpt 3 yrs ([place])   COLONOSCOPY  01/2005   diverticulosis rpt 5 yrs ([facility]off)    EPIDURAL BLOCK INJECTION Bilateral 06/2021   bilat L5 nerve block with benefit ([place])   IR NEPHROSTOMY PLACEMENT LEFT  10/19/2018  IR URETERAL STENT LEFT NEW ACCESS W/O SEP NEPHROSTOMY CATH  10/19/2018   LEFT HEART CATH AND CORONARY ANGIOGRAPHY N/A 11/01/2020   Procedure: LEFT HEART CATH AND CORONARY ANGIOGRAPHY;  Surgeon: Yvonne Kendall, MD;  Location: [facility] INV[facility]IVE [facility];  Service: Cardiovascular;  Laterality: N/A;   LITHOTRIPSY     X 2   NEPHROLITHOTOMY Left 10/19/2018   Procedure: NEPHROLITHOTOMY PERCUTANEOUS;  Surgeon: Marcine Matar, MD;  Location: [facility];  Service: Urology;  Laterality: Left;  3 HRS   PARATHYROIDECTOMY Right 04/18/2016   PARATHYROIDECTOMY for cyst Bud Face, MD)   SHOULDER ARTHROSCOPY W/ ROTATOR CUFF REPAIR Right    TONSILLECTOMY      Family History  Problem Relation Age of Onset   Cancer Mother        Breast Cancer   Cancer Sister        Breast Cancer   Diabetes Father    Heart disease Father    Diabetes Paternal Grandfather     Social History   Socioeconomic History   Marital status: Married    Spouse name: Not on file   Number of children: Not on file   Years of education: Not on file   Highest education level: Not on file  Occupational History   Occupation: Disabled    Employer: [employer]  Tobacco Use   Smoking status: Never   Smokeless tobacco: Never  Vaping Use   Vaping status: Never Used  Substance and Sexual Activity   Alcohol use: No   Drug use: No   Sexual activity: Not on file  Other Topics Concern   Not on file   Social History Narrative   Married   Children   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   Activity is severely limited by medical problems   Diet is "good".   Never a smoker   Alcohol: none         Social Determinants of Health   Financial Resource Strain: Medium Risk (02/07/2022)   Overall Financial Resource Strain (CARDIA)    Difficulty of Paying Living Expenses: Somewhat hard  Food Insecurity: No Food Insecurity (11/27/2021)   Hunger Vital Sign    Worried About Running Out of Food in the Last Year: Never true    Ran Out of Food in the Last Year: Never true  Transportation Needs: No Transportation Needs (11/27/2021)   PRAPARE - Administrator, Civil Service (Medical): No    Lack of Transportation (Non-Medical): No  Physical Activity: Inactive (04/20/2019)   Exercise Vital Sign    Days of Exercise per Week: 0 days    Minutes of Exercise per Session: 0 min  Stress: No Stress Concern Present (04/20/2019)   [employer] of Occupational Health - Occupational Stress Questionnaire    Feeling of Stress : Not at all  Social Connections: Not on file  Intimate Partner Violence: Not At Risk (04/20/2019)   Humiliation, Afraid, Rape, and Kick questionnaire    Fear of Current or Ex-Partner: No    Emotionally Abused: No    Physically Abused: No    Sexually Abused: No   Review of Systems Burned himself above sacrum with heating pad--using A&D on this Takes gabapentin 1200mg  at bedtime---is able to rest once he settles in No change in sensation in perineum No loss of bowel or bladder function     Objective:   Physical Exam Constitutional:      Appearance: Normal appearance.     Comments: Pain with transferring in  and out of chair/table  Musculoskeletal:     Comments: No spine tenderness Pain area is lower part of right buttock SLR negative   Skin:    Comments: No rash at pain site Has granulating ulcer on right mid medial buttock (without infection)   Neurological:     Mental Status: He is alert.     Comments: Gait is slow but stable No leg weakness--symmetric            Assessment & Plan:

## 2022-11-24 DIAGNOSIS — G5701 Lesion of sciatic nerve, right lower limb: Secondary | ICD-10-CM | POA: Diagnosis not present

## 2022-11-24 DIAGNOSIS — G894 Chronic pain syndrome: Secondary | ICD-10-CM | POA: Diagnosis not present

## 2022-11-27 DIAGNOSIS — N1831 Chronic kidney disease, stage 3a: Secondary | ICD-10-CM | POA: Diagnosis not present

## 2022-11-28 ENCOUNTER — Other Ambulatory Visit: Payer: Self-pay | Admitting: Internal Medicine

## 2022-12-06 DIAGNOSIS — I129 Hypertensive chronic kidney disease with stage 1 through stage 4 chronic kidney disease, or unspecified chronic kidney disease: Secondary | ICD-10-CM | POA: Diagnosis not present

## 2022-12-06 DIAGNOSIS — G4733 Obstructive sleep apnea (adult) (pediatric): Secondary | ICD-10-CM | POA: Diagnosis not present

## 2022-12-06 DIAGNOSIS — D351 Benign neoplasm of parathyroid gland: Secondary | ICD-10-CM | POA: Diagnosis not present

## 2022-12-06 DIAGNOSIS — Z6841 Body Mass Index (BMI) 40.0 and over, adult: Secondary | ICD-10-CM | POA: Diagnosis not present

## 2022-12-06 DIAGNOSIS — E1122 Type 2 diabetes mellitus with diabetic chronic kidney disease: Secondary | ICD-10-CM | POA: Diagnosis not present

## 2022-12-06 DIAGNOSIS — M109 Gout, unspecified: Secondary | ICD-10-CM | POA: Diagnosis not present

## 2022-12-06 DIAGNOSIS — N2581 Secondary hyperparathyroidism of renal origin: Secondary | ICD-10-CM | POA: Diagnosis not present

## 2022-12-06 DIAGNOSIS — N1831 Chronic kidney disease, stage 3a: Secondary | ICD-10-CM | POA: Diagnosis not present

## 2022-12-06 DIAGNOSIS — D631 Anemia in chronic kidney disease: Secondary | ICD-10-CM | POA: Diagnosis not present

## 2022-12-16 DIAGNOSIS — M5416 Radiculopathy, lumbar region: Secondary | ICD-10-CM | POA: Diagnosis not present

## 2022-12-17 ENCOUNTER — Telehealth: Payer: Self-pay | Admitting: Family Medicine

## 2022-12-17 NOTE — Telephone Encounter (Signed)
Spoke with pt asking about refill request since he's on [employer] pt assistance program for [employer]. States he only has 2 more doses left. But he got a letter stating he would need to renew 12/2022. Plz advise.

## 2022-12-17 NOTE — Telephone Encounter (Signed)
Noted. (See other notes under blue '[employer]' link.)

## 2022-12-17 NOTE — Telephone Encounter (Signed)
Prescription Request  12/17/2022  LOV: 08/19/2022  What is the name of the medication or equipment? Semaglutide, 1 MG/DOSE, 4 MG/3ML SOPN   Have you contacted your pharmacy to request a refill? No   Which pharmacy would you like this sent to?  Patient stated that meds get delivered to the office.     Patient notified that their request is being sent to the clinical staff for review and that they should receive a response within 2 business days.   Please advise at Mobile [phone] (mobile)

## 2022-12-18 ENCOUNTER — Other Ambulatory Visit: Payer: Self-pay | Admitting: Pharmacist

## 2022-12-18 DIAGNOSIS — E1142 Type 2 diabetes mellitus with diabetic polyneuropathy: Secondary | ICD-10-CM

## 2022-12-19 NOTE — Telephone Encounter (Signed)
Patient returned a call from you, asked for a call back when you are available. Says if it is after 2:45 today, he will be in a meeting and to retry him tomorrow.

## 2022-12-20 NOTE — Progress Notes (Unsigned)
Patient called clinic last week asking to speak to someone about Ozempic refill from Novo as he is running low on mediation.   Patient returned call today regarding Ozempic refill from [employer].   Looks like his last shipment was the 0.25/0.5 mg pen strength though at some point his Endocrinology team advised him to increase dose to 1.0 mg weekly. However, no new prescription was sent in for this dose.   Unfortunately, the Novo re-order deadline for Medicare pt was 12/14/22.   We discussed a few possible solutions: Pt to see if Endo office accepts samples. If so, request sample to get through just this last month of the year.  Refill x1 month to local pharmacy (requests [facility]). We discussed medicare copay for brand is typically $47 for a month which is reasonable.   Medication Assistance:  Reports all other medications are affordable, though is on 2 insulins for $35 each per month. Insulins are both Novo products and are likely available thorough Novo PAP. Can add no Novo PAP for 2025 if PCP amenable to prescribing this on Endo's behalf.   Does not look like patient has been re-enrolled for Novo 2025 at this time. Will initiate re-enrollment today.  Patient forms:  Completed online with patient today. Uploaded to Media Tab.  Provider page completed and faxed to [facility] with instruction to place in PCP inbox then fax to MAP program upon signature, scan into chart.  Requested Prescriptions: Ozempic 1.0 mg Novofine Pen Needles Novolin N Novolin R Patient is not eligible for copay cards due to government insurance. [facility] team cc'd for future tracking/correspondences.   Future Appointments  Date Time Provider Department Center  01/01/2023 12:30 PM [name], MD [facility]  02/03/2023 11:50 AM Shamleffer, Konrad Dolores, MD [facility] None  09/04/2023  2:30 PM [facility] ANNUAL WELLNESS VISIT 1 [facility]    Loree Fee, PharmD Clinical  Pharmacist [facility] [phone]

## 2022-12-21 MED ORDER — SEMAGLUTIDE (1 MG/DOSE) 4 MG/3ML ~~LOC~~ SOPN
1.0000 mg | PEN_INJECTOR | SUBCUTANEOUS | 1 refills | Status: DC
Start: 2022-12-21 — End: 2023-01-10

## 2022-12-21 NOTE — Progress Notes (Signed)
ERx 

## 2022-12-24 NOTE — Progress Notes (Signed)
12/10: Patient called back stating that one box of Ozempic was $237 at [facility]. Seeming though he is likely in the donut hole.   Patient states that he will call Endo office shortly to see if they receive samples of Ozempic. If unable to receive samples, he is aware he may have to go without Ozempic for a couple of weeks until the new year.   PCP office does not accept medication samples.

## 2022-12-25 ENCOUNTER — Telehealth: Payer: Self-pay

## 2022-12-25 DIAGNOSIS — T2124XA Burn of second degree of lower back, initial encounter: Secondary | ICD-10-CM | POA: Diagnosis not present

## 2022-12-25 NOTE — Telephone Encounter (Signed)
Patient will stop by and fill out patient assistance application.  Ozempic sample will be labeled in fridge for pick up as well. Patient will take 2 injections to equal the 1 mg  Medication Samples have been provided to the patient.  Drug name: Ozempic        Strength: 0.5mg         Qty: 1 box   LOT: [id number]  Exp.Date: 03/13/2024  Dosing instructions: INJECT 2 DOSES OF 0.5MG  to get the   The patient has been instructed regarding the correct time, dose, and frequency of taking this medication, including desired effects and most common side effects.   Jelani Vreeland L Tatum Massman 11:21 AM 12/25/2022 .

## 2022-12-27 ENCOUNTER — Telehealth: Payer: Self-pay

## 2022-12-27 NOTE — Telephone Encounter (Signed)
PAP: Application for Novolin Ozempic NOVOLIN R AND NOVOFINE PEN NEEDLES has been submitted to PAP Companies: [employer], online   PER PHARMACIST

## 2022-12-27 NOTE — Telephone Encounter (Signed)
-----   Message from Loree Fee sent at 12/23/2022  3:25 PM EST ----- Lorain Childes --  Assisting patient w Novo 2025 re-enrollment (completed online, uploaded to media tab).  No action needed at this time for you, though please add patient to your [facility] MAP list for future.   Thanks!

## 2023-01-01 ENCOUNTER — Ambulatory Visit (INDEPENDENT_AMBULATORY_CARE_PROVIDER_SITE_OTHER): Payer: Medicare Other | Admitting: Family Medicine

## 2023-01-01 ENCOUNTER — Encounter: Payer: Self-pay | Admitting: Family Medicine

## 2023-01-01 ENCOUNTER — Other Ambulatory Visit: Payer: Self-pay | Admitting: Cardiovascular Disease

## 2023-01-01 VITALS — BP 138/66 | HR 91 | Temp 98.8°F | Ht 63.0 in | Wt 235.1 lb

## 2023-01-01 DIAGNOSIS — R531 Weakness: Secondary | ICD-10-CM | POA: Diagnosis not present

## 2023-01-01 DIAGNOSIS — T300 Burn of unspecified body region, unspecified degree: Secondary | ICD-10-CM | POA: Diagnosis not present

## 2023-01-01 DIAGNOSIS — T148XXA Other injury of unspecified body region, initial encounter: Secondary | ICD-10-CM | POA: Diagnosis not present

## 2023-01-01 DIAGNOSIS — E1165 Type 2 diabetes mellitus with hyperglycemia: Secondary | ICD-10-CM

## 2023-01-01 DIAGNOSIS — Z794 Long term (current) use of insulin: Secondary | ICD-10-CM

## 2023-01-01 DIAGNOSIS — L089 Local infection of the skin and subcutaneous tissue, unspecified: Secondary | ICD-10-CM | POA: Diagnosis not present

## 2023-01-01 DIAGNOSIS — M79604 Pain in right leg: Secondary | ICD-10-CM

## 2023-01-01 LAB — POCT GLYCOSYLATED HEMOGLOBIN (HGB A1C): Hemoglobin A1C: 7.3 % — AB (ref 4.0–5.6)

## 2023-01-01 LAB — COMPREHENSIVE METABOLIC PANEL
ALT: 32 U/L (ref 0–53)
AST: 38 U/L — ABNORMAL HIGH (ref 0–37)
Albumin: 3.2 g/dL — ABNORMAL LOW (ref 3.5–5.2)
Alkaline Phosphatase: 106 U/L (ref 39–117)
BUN: 46 mg/dL — ABNORMAL HIGH (ref 6–23)
CO2: 30 meq/L (ref 19–32)
Calcium: 8.8 mg/dL (ref 8.4–10.5)
Chloride: 92 meq/L — ABNORMAL LOW (ref 96–112)
Creatinine, Ser: 1.81 mg/dL — ABNORMAL HIGH (ref 0.40–1.50)
GFR: 36.25 mL/min — ABNORMAL LOW (ref >60.00)
Glucose, Bld: 215 mg/dL — ABNORMAL HIGH (ref 70–99)
Potassium: 4.7 meq/L (ref 3.5–5.1)
Sodium: 133 meq/L — ABNORMAL LOW (ref 135–145)
Total Bilirubin: 1.8 mg/dL — ABNORMAL HIGH (ref 0.2–1.2)
Total Protein: 6.2 g/dL (ref 6.0–8.3)

## 2023-01-01 LAB — CBC WITH DIFFERENTIAL/PLATELET
Basophils Absolute: 0.1 10*3/uL (ref 0.0–0.1)
Basophils Relative: 0.8 % (ref 0.0–3.0)
Eosinophils Absolute: 0.2 10*3/uL (ref 0.0–0.7)
Eosinophils Relative: 1.2 % (ref 0.0–5.0)
HCT: 35.8 % — ABNORMAL LOW (ref 39.0–52.0)
Hemoglobin: 11.6 g/dL — ABNORMAL LOW (ref 13.0–17.0)
Lymphocytes Relative: 4.9 % — ABNORMAL LOW (ref 12.0–46.0)
Lymphs Abs: 0.8 10*3/uL (ref 0.7–4.0)
MCHC: 32.6 g/dL (ref 30.0–36.0)
MCV: 89.7 fL (ref 78.0–100.0)
Monocytes Absolute: 0.9 10*3/uL (ref 0.1–1.0)
Monocytes Relative: 5.3 % (ref 3.0–12.0)
Neutro Abs: 14.6 10*3/uL — ABNORMAL HIGH (ref 1.4–7.7)
Neutrophils Relative %: 87.8 % — ABNORMAL HIGH (ref 43.0–77.0)
Platelets: 302 10*3/uL (ref 150.0–400.0)
RBC: 3.99 Mil/uL — ABNORMAL LOW (ref 4.22–5.81)
RDW: 16 % — ABNORMAL HIGH (ref 11.5–15.5)
WBC: 16.7 10*3/uL — ABNORMAL HIGH (ref 4.0–10.5)

## 2023-01-01 MED ORDER — CEPHALEXIN 500 MG PO CAPS: 500.0000 mg | ORAL_CAPSULE | Freq: Four times a day (QID) | ORAL | 0 refills | Status: DC

## 2023-01-01 MED ORDER — DOXYCYCLINE HYCLATE 100 MG PO TABS: 100.0000 mg | ORAL_TABLET | Freq: Two times a day (BID) | ORAL | 0 refills | Status: DC

## 2023-01-01 NOTE — Patient Instructions (Addendum)
I will ask home health to come out to your house for physical therapy and recommendations on bed.  Start antibiotics doxycycline twice daily and keflex 4 times daily for 7 days sent to pharmacy  I will refer you to wound clinic for infected sacral wound.  Continue insulin, keep appointment with endocrinologist.

## 2023-01-01 NOTE — Progress Notes (Signed)
Ph: [phone] Fax: [phone]   Patient ID: Donald [name], male    DOB: [birth year], 77 y.o.   MRN: [id number]  This visit was conducted in person.  BP 138/66   Pulse 91   Temp 98.8 F (37.1 C) (Oral)   Ht 5\' 3"  (1.6 m)   Wt 235 lb 2 oz (106.7 kg)   SpO2 96%   BMI 41.65 kg/m    CC: 6 mo f/u visit  Subjective:   HPI: Donald [name] is a 77 y.o. male presenting on 01/01/2023 for Medical Management of Chronic Issues (Here for 6 mo f/u. Pt accompanied by wife, [place]. )   Saw dermatologist Dr Adolphus Birchwood - 1st and 2nd degree sacral burn from heating pad use sustained after falling asleep on heating pad. Treating with silver sulfadiazine cream twice daily. Notes anorexia but no fevers/chills, nausea/vomiting.   R leg pain after fall - seen in office twice this past month, treated with prednisone course without benefit, followed by cyclobenzaprine PRN, heat. Followed up with Dr Ethelene Hal - had 2 epidural shots with some benefit.   Had a fall out of bed three times this past week on Saturday and Sunday night - scraped both knees. Notes worsening weakness.   Notes worsening shaking to bilateral hands.   DM - does regularly check sugars with CGM. Established with endo Dr Lonzo Cloud - last seen 08/2022 with plan f/u 4 months. Compliant with antihyperglycemic regimen which includes: novolog N 140u at bedtime, Novolin R 120-130u before brunch and supper, continue Novolin R SSI. Has been off ozempic 1mg  weekly due to cost - considering switching insurance in the new year. Has had few low sugars /hypoglycemic symptoms - worse early mornings nadir 54. Denies paresthesias, blurry vision. Last diabetic eye exam 06/2022. Glucometer brand: see below. Last foot exam: 05/2022. DSME: remotely at [facility].  CGM data: freestyle libre 2 30 day average sugar: 157, time in range 63%, hypoglycemia 4%, stage 1 hyperglycemia 24%, stage 2 hyperglycemia 9%.  Lab Results  Component Value Date   HGBA1C 7.3  (A) 01/01/2023   Diabetic Foot Exam - Simple   No data filed    Lab Results  Component Value Date   MICROALBUR 35.8 (H) 06/25/2022        Relevant past medical, surgical, family and social history reviewed and updated as indicated. Interim medical history since our last visit reviewed. Allergies and medications reviewed and updated. Outpatient Medications Prior to Visit  Medication Sig Dispense Refill   albuterol (VENTOLIN HFA) 108 (90 Base) MCG/ACT inhaler Inhale 2 puffs into the lungs every 6 (six) hours as needed for wheezing or shortness of breath. 8 g 0   allopurinol (ZYLOPRIM) 300 MG tablet TAKE ONE TABLET BY MOUTH EVERY DAY 90 tablet 4   ALPRAZolam (XANAX) 1 MG tablet TAKE 1 TABLET BY MOUTH AT BEDTIME IF NEEDED FOR SLEEP. (Patient taking differently: Take 0.5 mg by mouth at bedtime.) 30 tablet 0   aspirin 81 MG EC tablet Take 81 mg by mouth daily. Swallow whole.     Cholecalciferol (VITAMIN D3) 25 MCG (1000 UT) CAPS Take 1 capsule (1,000 Units total) by mouth daily. 30 capsule    colchicine 0.6 MG tablet TAKE 1 TABLET BY MOUTH DAILY AS NEEDED FOR GOUT GLARE. ON FIRST DAY OF GOUT FLARE,MAY TAKE 2 TABLETS AT ONCE AS DIRECTED 30 tablet 0   Continuous Glucose Sensor (FREESTYLE LIBRE 2 SENSOR) MISC 1 Device by Does not apply route every 14 (fourteen)  days. 6 each 3   cyanocobalamin (VITAMIN B12) 1000 MCG/ML injection INJECT INTRAMUSCULARLY EVERY 30 DAYS 1 mL 6   cyclobenzaprine (FLEXERIL) 10 MG tablet Take 1 tablet (10 mg total) by mouth 3 (three) times daily as needed for muscle spasms. 30 tablet 0   doxazosin (CARDURA) 1 MG tablet TAKE ONE TABLET BY MOUTH TWICE DAILY 180 tablet 2   fluticasone (FLONASE) 50 MCG/ACT nasal spray Place 2 sprays into both nostrils daily. 16 g 1   furosemide (LASIX) 80 MG tablet TAKE 1 TABLET BY MOUTH EVERY DAY. MAY TAKE AN EXTRA PILL FOR SWELLING. 90 tablet 1   gabapentin (NEURONTIN) 600 MG tablet TAKE TWO TABLETS AT BEDTIME 180 tablet 3   glucose  blood (GE100 BLOOD GLUCOSE TEST) test strip TEST FOUR TIMES DAILY 200 each 10   HYDROcodone-acetaminophen (NORCO) 10-325 MG tablet Take 1 tablet by mouth every 6 (six) hours as needed for moderate pain ((typically once to twice daily if needed)).  30 tablet 0   isosorbide mononitrate (IMDUR) 30 MG 24 hr tablet TAKE ONE TABLET BY MOUTH DAILY 90 tablet 2   losartan (COZAAR) 25 MG tablet TAKE ONE TABLET BY MOUTH AT BEDTIME 90 tablet 1   lovastatin (MEVACOR) 40 MG tablet TAKE TWO TABLETS BY MOUTH AT BEDTIME 180 tablet 1   meclizine (ANTIVERT) 25 MG tablet Take 25 mg by mouth 3 (three) times daily as needed for dizziness.     Naphazoline-Pheniramine (OPCON-A OP) Place 1 drop into both eyes 3 (three) times daily as needed (itchy eyes.).      nitroGLYCERIN (NITROSTAT) 0.4 MG SL tablet Place 1 tablet (0.4 mg total) under the tongue every 5 (five) minutes as needed for chest pain. 25 tablet 3   NOVOLIN N 100 UNIT/ML injection INJECT 160-170 UNITS TOTAL INTO THE SKIN AT BEDTIME 100 mL 0   NOVOLIN R 100 UNIT/ML injection INJECT 140 UNITS IN THE MORNING, 140 UNITS IN THE AFTERNOON, AND 160-180 UNITS IN THE EVENING USE BEFORE A MEAL 140 mL 1   ondansetron (ZOFRAN) 4 MG tablet Take 1 tablet (4 mg total) by mouth 3 (three) times daily as needed for nausea. 20 tablet 0   Potassium Citrate 15 MEQ (1620 MG) TBCR Take 1 tablet by mouth 3 (three) times daily. Takes 2 tablets daily     Semaglutide, 1 MG/DOSE, 4 MG/3ML SOPN Inject 1 mg as directed once a week. 3 mL 1   Sennosides-Docusate Sodium (STOOL SOFTENER & LAXATIVE PO) Take 2 tablets by mouth at bedtime.     silver sulfADIAZINE (SILVADENE) 1 % cream Apply topically 2 (two) times daily.     Syringe/Needle, Disp, (SYRINGE 3CC/22GX1-1/2") 22G X 1-1/2" 3 ML MISC Use to administer monthly b12 shots 50 each 0   No facility-administered medications prior to visit.     Per HPI unless specifically indicated in ROS section below Review of Systems  Objective:  BP  138/66   Pulse 91   Temp 98.8 F (37.1 C) (Oral)   Ht 5\' 3"  (1.6 m)   Wt 235 lb 2 oz (106.7 kg)   SpO2 96%   BMI 41.65 kg/m   Wt Readings from Last 3 Encounters:  01/01/23 235 lb 2 oz (106.7 kg)  11/21/22 247 lb (112 kg)  11/07/22 239 lb 9.6 oz (108.7 kg)      Physical Exam Vitals and nursing note reviewed.  Constitutional:      Appearance: Normal appearance. He is not ill-appearing.     Comments:  Ambulating with 2 canes  Cardiovascular:     Rate and Rhythm: Normal rate and regular rhythm.     Pulses: Normal pulses.     Heart sounds: Normal heart sounds. No murmur heard. Pulmonary:     Effort: Pulmonary effort is normal. No respiratory distress.     Breath sounds: Normal breath sounds. No wheezing, rhonchi or rales.  Musculoskeletal:     Right lower leg: No edema.     Left lower leg: No edema.  Skin:    General: Skin is warm and dry.     Findings: Erythema and wound present.          Comments: Open wound to sacral region of back with surrounding erythema with mild surrounding induration but no fluctuance, unable to express anything from wound Abrasions to bilateral knees and elbows - dressed with triple abx ointment and covered with bandaids  Neurological:     Mental Status: He is alert.  Psychiatric:        Mood and Affect: Mood normal.        Behavior: Behavior normal.    Sacral wound:   Wound care:  Wound culture collected. Area cleaned with normal saline, unable to express any pus from wound, dressed with silvadene cream, and covered with nonstick gauze followed by abdominal pad and paper tape.      Results for orders placed or performed in visit on 01/01/23  POCT glycosylated hemoglobin (Hb A1C)   Collection Time: 01/01/23 12:25 PM  Result Value Ref Range   Hemoglobin A1C 7.3 (A) 4.0 - 5.6 %   HbA1c POC (<> result, manual entry)     HbA1c, POC (prediabetic range)     HbA1c, POC (controlled diabetic range)      Assessment & Plan:  I spent 45 minutes  caring for this patient today face to face, reviewing labs, prior records from another facility, performing a medically appropriate examination and/or evaluation, counseling and educating the patient on worsening wound with concern for infection in setting of diabetic, documenting in the record and arranging for close follow up appointment in office, home health evaluation, and wound clinic referral.  Problem List Items Addressed This Visit     Generalized weakness   Acute worsening after several falls at home slipping out of bed, one involved prolonged period on floor.  Check labs today.  Suggested changing beds, consider railing for bed.  Refer to home health for PT eval, home safety eval, wound care RN eval.       Relevant Orders   Ambulatory referral to Home Health   Type 2 diabetes mellitus with hyperglycemia, with long-term current use of insulin (HCC)   Chronic, followed by endo. Recent A1c improving. Currently off ozempic as unable to afford while he gets PAP shipment.       Relevant Orders   POCT glycosylated hemoglobin (Hb A1C) (Completed)   Right leg pain   Saw Dr Ethelene Hal s/p epidural steroid injections with improvement in leg pain.       Skin burn   Suffered recent sacral skin burn, started on silvadene cream, this has not improved. See below.       Relevant Orders   Ambulatory referral to Home Health   Infected wound - Primary   Concern for infected sacral burn in diabetic - Rx keflex and doxycycline to cover strep, MRSA. Wound culture sent.  Will refer to [facility] wound care clinic given location and deterioration.  He had been dressing wound by  himself - advised have wife start dressing wound.  Will return in 2 days for close f/u. No systemic symptoms - reviewed symptoms that would indication progression of infection and necessitate ER evaluation. Check CBC, CMP today.       Relevant Medications   cephALEXin (KEFLEX) 500 MG capsule   Other Relevant Orders    WOUND CULTURE   Ambulatory referral to Wound Clinic   Comprehensive metabolic panel   CBC with Differential/Platelet   Ambulatory referral to Home Health     Meds ordered this encounter  Medications   doxycycline (VIBRA-TABS) 100 MG tablet    Sig: Take 1 tablet (100 mg total) by mouth 2 (two) times daily.    Dispense:  14 tablet    Refill:  0   cephALEXin (KEFLEX) 500 MG capsule    Sig: Take 1 capsule (500 mg total) by mouth 4 (four) times daily.    Dispense:  28 capsule    Refill:  0    Orders Placed This Encounter  Procedures   WOUND CULTURE    Source:   sacral wound   Comprehensive metabolic panel   CBC with Differential/Platelet   Ambulatory referral to Wound Clinic    Referral Priority:   Urgent    Referral Type:   Consultation    Referral Reason:   Specialty Services Required    Requested Specialty:   Wound Care    Number of Visits Requested:   1   Ambulatory referral to Home Health    Referral Priority:   Urgent    Referral Type:   Home Health Care    Referral Reason:   Specialty Services Required    Requested Specialty:   Home Health Services    Number of Visits Requested:   1   POCT glycosylated hemoglobin (Hb A1C)    Patient Instructions  I will ask home health to come out to your house for physical therapy and recommendations on bed.  Start antibiotics doxycycline twice daily and keflex 4 times daily for 7 days sent to pharmacy  I will refer you to wound clinic for infected sacral wound.  Continue insulin, keep appointment with endocrinologist.   Follow up plan: No follow-ups on file.  [name], MD

## 2023-01-01 NOTE — Assessment & Plan Note (Signed)
Saw Dr Ethelene Hal s/p epidural steroid injections with improvement in leg pain.

## 2023-01-01 NOTE — Assessment & Plan Note (Addendum)
Concern for infected sacral burn in diabetic - Rx keflex and doxycycline to cover strep, MRSA. Wound culture sent.  Will refer to [facility] wound care clinic given location and deterioration.  He had been dressing wound by himself - advised have wife start dressing wound.  Will return in 2 days for close f/u. No systemic symptoms - reviewed symptoms that would indication progression of infection and necessitate ER evaluation. Check CBC, CMP today.

## 2023-01-01 NOTE — Assessment & Plan Note (Signed)
Chronic, followed by endo. Recent A1c improving. Currently off ozempic as unable to afford while he gets PAP shipment.

## 2023-01-01 NOTE — Assessment & Plan Note (Signed)
Suffered recent sacral skin burn, started on silvadene cream, this has not improved. See below.

## 2023-01-01 NOTE — Assessment & Plan Note (Signed)
Acute worsening after several falls at home slipping out of bed, one involved prolonged period on floor.  Check labs today.  Suggested changing beds, consider railing for bed.  Refer to home health for PT eval, home safety eval, wound care RN eval.

## 2023-01-02 DIAGNOSIS — L89103 Pressure ulcer of unspecified part of back, stage 3: Secondary | ICD-10-CM | POA: Diagnosis not present

## 2023-01-03 ENCOUNTER — Encounter: Payer: Self-pay | Admitting: Family Medicine

## 2023-01-03 ENCOUNTER — Ambulatory Visit (INDEPENDENT_AMBULATORY_CARE_PROVIDER_SITE_OTHER): Payer: Medicare Other | Admitting: Family Medicine

## 2023-01-03 ENCOUNTER — Other Ambulatory Visit: Payer: Self-pay | Admitting: Internal Medicine

## 2023-01-03 VITALS — BP 120/70 | HR 100 | Ht 63.0 in | Wt 238.0 lb

## 2023-01-03 DIAGNOSIS — Z794 Long term (current) use of insulin: Secondary | ICD-10-CM

## 2023-01-03 DIAGNOSIS — E1165 Type 2 diabetes mellitus with hyperglycemia: Secondary | ICD-10-CM

## 2023-01-03 DIAGNOSIS — T148XXA Other injury of unspecified body region, initial encounter: Secondary | ICD-10-CM | POA: Diagnosis not present

## 2023-01-03 DIAGNOSIS — E440 Moderate protein-calorie malnutrition: Secondary | ICD-10-CM | POA: Diagnosis not present

## 2023-01-03 DIAGNOSIS — L089 Local infection of the skin and subcutaneous tissue, unspecified: Secondary | ICD-10-CM | POA: Diagnosis not present

## 2023-01-03 DIAGNOSIS — E46 Unspecified protein-calorie malnutrition: Secondary | ICD-10-CM | POA: Insufficient documentation

## 2023-01-03 NOTE — Progress Notes (Signed)
Ph: [phone] Fax: [phone]   Patient ID: Donald [name], male    DOB: 10-28-1945, 77 y.o.   MRN: [id number]  This visit was conducted in person.  BP 120/70 (BP Location: Right Arm, Patient Position: Sitting, Cuff Size: Large)   Pulse 100   Ht 5\' 3"  (1.6 m)   Wt 238 lb (108 kg)   SpO2 99%   BMI 42.16 kg/m    CC: f/u wound care visit  Subjective:   HPI: Donald [name] is a 77 y.o. male presenting on 01/03/2023 for Wound Care Follow-up (Here with wife. Saw dermatologist yesterday. [name] he was shocked it was so bad. Pt starts since starting new antibiotics the other day, the pain has gotten a little better. )   See prior note for details.  Briefly, seen here 2d ago with concern for infected sacral wound that may have started as skin burn from heating pad.  Wound culture: no growth to date, no white cells or bacteria seen on gram stain WBC 16.7, Hgb 11.6, plt 300, 88% neutrophils, 5% lyphocytes Cr 1.8, GFR 36 (stable CKD), glu 215, alb 3.2, A1c 7.3%.      Relevant past medical, surgical, family and social history reviewed and updated as indicated. Interim medical history since our last visit reviewed. Allergies and medications reviewed and updated. Outpatient Medications Prior to Visit  Medication Sig Dispense Refill   albuterol (VENTOLIN HFA) 108 (90 Base) MCG/ACT inhaler Inhale 2 puffs into the lungs every 6 (six) hours as needed for wheezing or shortness of breath. 8 g 0   allopurinol (ZYLOPRIM) 300 MG tablet TAKE ONE TABLET BY MOUTH EVERY DAY 90 tablet 4   ALPRAZolam (XANAX) 1 MG tablet TAKE 1 TABLET BY MOUTH AT BEDTIME IF NEEDED FOR SLEEP. (Patient taking differently: Take 0.5 mg by mouth at bedtime.) 30 tablet 0   aspirin 81 MG EC tablet Take 81 mg by mouth daily. Swallow whole.     cephALEXin (KEFLEX) 500 MG capsule Take 1 capsule (500 mg total) by mouth 4 (four) times daily. 28 capsule 0   Cholecalciferol (VITAMIN D3) 25 MCG (1000 UT) CAPS Take 1 capsule  (1,000 Units total) by mouth daily. 30 capsule    colchicine 0.6 MG tablet TAKE 1 TABLET BY MOUTH DAILY AS NEEDED FOR GOUT GLARE. ON FIRST DAY OF GOUT FLARE,MAY TAKE 2 TABLETS AT ONCE AS DIRECTED 30 tablet 0   Continuous Glucose Sensor (FREESTYLE LIBRE 2 SENSOR) MISC 1 Device by Does not apply route every 14 (fourteen) days. 6 each 3   cyanocobalamin (VITAMIN B12) 1000 MCG/ML injection INJECT INTRAMUSCULARLY EVERY 30 DAYS 1 mL 6   cyclobenzaprine (FLEXERIL) 10 MG tablet Take 1 tablet (10 mg total) by mouth 3 (three) times daily as needed for muscle spasms. 30 tablet 0   doxazosin (CARDURA) 1 MG tablet TAKE ONE TABLET BY MOUTH TWICE DAILY 180 tablet 2   doxycycline (VIBRA-TABS) 100 MG tablet Take 1 tablet (100 mg total) by mouth 2 (two) times daily. 14 tablet 0   fluticasone (FLONASE) 50 MCG/ACT nasal spray Place 2 sprays into both nostrils daily. 16 g 1   furosemide (LASIX) 80 MG tablet TAKE 1 TABLET BY MOUTH EVERY DAY. MAY TAKE AN EXTRA PILL FOR SWELLING. 90 tablet 1   gabapentin (NEURONTIN) 600 MG tablet TAKE TWO TABLETS AT BEDTIME 180 tablet 3   glucose blood (GE100 BLOOD GLUCOSE TEST) test strip TEST FOUR TIMES DAILY 200 each 10   HYDROcodone-acetaminophen (NORCO) 10-325  MG tablet Take 1 tablet by mouth every 6 (six) hours as needed for moderate pain ((typically once to twice daily if needed)).  30 tablet 0   isosorbide mononitrate (IMDUR) 30 MG 24 hr tablet TAKE ONE TABLET BY MOUTH DAILY 90 tablet 2   losartan (COZAAR) 25 MG tablet TAKE ONE TABLET BY MOUTH AT BEDTIME 90 tablet 1   lovastatin (MEVACOR) 40 MG tablet TAKE TWO TABLETS BY MOUTH AT BEDTIME 180 tablet 1   meclizine (ANTIVERT) 25 MG tablet Take 25 mg by mouth 3 (three) times daily as needed for dizziness.     Naphazoline-Pheniramine (OPCON-A OP) Place 1 drop into both eyes 3 (three) times daily as needed (itchy eyes.).      nitroGLYCERIN (NITROSTAT) 0.4 MG SL tablet Place 1 tablet (0.4 mg total) under the tongue every 5 (five)  minutes as needed for chest pain. 25 tablet 3   NOVOLIN N 100 UNIT/ML injection INJECT 160-170 UNITS TOTAL INTO THE SKIN AT BEDTIME 100 mL 0   NOVOLIN R 100 UNIT/ML injection INJECT 140 UNITS IN THE MORNING, 140 UNITS IN THE AFTERNOON, AND 160-180 UNITS IN THE EVENING USE BEFORE A MEAL 140 mL 1   ondansetron (ZOFRAN) 4 MG tablet Take 1 tablet (4 mg total) by mouth 3 (three) times daily as needed for nausea. 20 tablet 0   Potassium Citrate 15 MEQ (1620 MG) TBCR Take 1 tablet by mouth 3 (three) times daily. Takes 2 tablets daily     Semaglutide, 1 MG/DOSE, 4 MG/3ML SOPN Inject 1 mg as directed once a week. 3 mL 1   Sennosides-Docusate Sodium (STOOL SOFTENER & LAXATIVE PO) Take 2 tablets by mouth at bedtime.     silver sulfADIAZINE (SILVADENE) 1 % cream Apply topically 2 (two) times daily.     Syringe/Needle, Disp, (SYRINGE 3CC/22GX1-1/2") 22G X 1-1/2" 3 ML MISC Use to administer monthly b12 shots 50 each 0   No facility-administered medications prior to visit.     Per HPI unless specifically indicated in ROS section below Review of Systems  Objective:  BP 120/70 (BP Location: Right Arm, Patient Position: Sitting, Cuff Size: Large)   Pulse 100   Ht 5\' 3"  (1.6 m)   Wt 238 lb (108 kg)   SpO2 99%   BMI 42.16 kg/m   Wt Readings from Last 3 Encounters:  01/03/23 238 lb (108 kg)  01/01/23 235 lb 2 oz (106.7 kg)  11/21/22 247 lb (112 kg)      Physical Exam Vitals and nursing note reviewed.  Constitutional:      Appearance: Normal appearance. He is not ill-appearing.     Comments: Able to stand longer during wound dressing   Skin:    General: Skin is warm and dry.     Findings: Erythema and wound present.          Comments: Open wound to sacral region of back with surrounding erythema and induration but no fluctuance or eschar Superior wound diameter 5cm, inferior diameter 2cm, length of wound 5cm  No significant drainage present Granulation tissue now present with significant  decreased depth of wound No fistulas or skin tracts present  Neurological:     Mental Status: He is alert.  Psychiatric:        Behavior: Behavior normal.       Wound care:  Area cleaned with 2 syringes of normal saline, wound grossly debrided with wet gauze, dressed with triple abx cream, and covered with nonstick gauze followed by gauze/abdominal  pad and paper tape.   Sacral wound:   Results for orders placed or performed in visit on 01/01/23  POCT glycosylated hemoglobin (Hb A1C)   Collection Time: 01/01/23 12:25 PM  Result Value Ref Range   Hemoglobin A1C 7.3 (A) 4.0 - 5.6 %   HbA1c POC (<> result, manual entry)     HbA1c, POC (prediabetic range)     HbA1c, POC (controlled diabetic range)    WOUND CULTURE   Collection Time: 01/01/23  1:27 PM   Specimen: Wound  Result Value Ref Range   MICRO NUMBER: [id number]    SPECIMEN QUALITY: Adequate    SOURCE: WOUND (SITE NOT SPECIFIED)    STATUS: PRELIMINARY    GRAM STAIN:      No organisms or white blood cells seen No epithelial cells seen  Comprehensive metabolic panel   Collection Time: 01/01/23  1:27 PM  Result Value Ref Range   Sodium 133 (L) 135 - 145 mEq/L   Potassium 4.7 3.5 - 5.1 mEq/L   Chloride 92 (L) 96 - 112 mEq/L   CO2 30 19 - 32 mEq/L   Glucose, Bld 215 (H) 70 - 99 mg/dL   BUN 46 (H) 6 - 23 mg/dL   Creatinine, Ser 5.28 (H) 0.40 - 1.50 mg/dL   Total Bilirubin 1.8 (H) 0.2 - 1.2 mg/dL   Alkaline Phosphatase 106 39 - 117 U/L   AST 38 (H) 0 - 37 U/L   ALT 32 0 - 53 U/L   Total Protein 6.2 6.0 - 8.3 g/dL   Albumin 3.2 (L) 3.5 - 5.2 g/dL   GFR 41.32 (L) >44.01 mL/min   Calcium 8.8 8.4 - 10.5 mg/dL  CBC with Differential/Platelet   Collection Time: 01/01/23  1:27 PM  Result Value Ref Range   WBC 16.7 (H) 4.0 - 10.5 K/uL   RBC 3.99 (L) 4.22 - 5.81 Mil/uL   Hemoglobin 11.6 (L) 13.0 - 17.0 g/dL   HCT 02.7 (L) 25.3 - 66.4 %   MCV 89.7 78.0 - 100.0 fl   MCHC 32.6 30.0 - 36.0 g/dL   RDW 40.3 (H) 47.4 - 25.9 %    Platelets 302.0 150.0 - 400.0 K/uL   Neutrophils Relative % 87.8 (H) 43.0 - 77.0 %   Lymphocytes Relative 4.9 (L) 12.0 - 46.0 %   Monocytes Relative 5.3 3.0 - 12.0 %   Eosinophils Relative 1.2 0.0 - 5.0 %   Basophils Relative 0.8 0.0 - 3.0 %   Neutro Abs 14.6 (H) 1.4 - 7.7 K/uL   Lymphs Abs 0.8 0.7 - 4.0 K/uL   Monocytes Absolute 0.9 0.1 - 1.0 K/uL   Eosinophils Absolute 0.2 0.0 - 0.7 K/uL   Basophils Absolute 0.1 0.0 - 0.1 K/uL   Discussed above lab with patient at OV.   Assessment & Plan:   Problem List Items Addressed This Visit     Type 2 diabetes mellitus with hyperglycemia, with long-term current use of insulin (HCC)   Infected wound - Primary   Infected sacral wound in diabetic. Still with significant erythema however wound depth is improved and now with granulation tissue present.  Will continue the course - keflex, doxycycline, dressing changes daily with wound cleaning and topical silvadene dressing.  Extensively reviewed red flags or symptoms to seek ER care over weekend.  We are still trying to get him into wound clinic, home health wound care pending.  RTC Monday for close wound check.  Reviewed importance of glycemic control and good nutritional  status - start glucerna shakes daily.       Protein-calorie malnutrition (HCC)   Recent albumin low at 3.2.  Reviewed importance of protein intake for wound healing.  Start glucerna supplement daily.         No orders of the defined types were placed in this encounter.   No orders of the defined types were placed in this encounter.   Patient Instructions  Start glucerna shake supplements 1-2 a day.  Continue antibiotics. To ER if any worsening symptoms.  Return Monday for wound check at 1pm.   Follow up plan: Return in about 3 days (around 01/06/2023) for follow up visit.  [name], MD

## 2023-01-03 NOTE — Assessment & Plan Note (Signed)
Infected sacral wound in diabetic. Still with significant erythema however wound depth is improved and now with granulation tissue present.  Will continue the course - keflex, doxycycline, dressing changes daily with wound cleaning and topical silvadene dressing.  Extensively reviewed red flags or symptoms to seek ER care over weekend.  We are still trying to get him into wound clinic, home health wound care pending.  RTC Monday for close wound check.  Reviewed importance of glycemic control and good nutritional status - start glucerna shakes daily.

## 2023-01-03 NOTE — Assessment & Plan Note (Addendum)
Recent albumin low at 3.2.  Reviewed importance of protein intake for wound healing.  Start glucerna supplement daily.

## 2023-01-03 NOTE — Patient Instructions (Addendum)
Start glucerna shake supplements 1-2 a day.  Continue antibiotics. To ER if any worsening symptoms.  Return Monday for wound check at 1pm.

## 2023-01-04 LAB — WOUND CULTURE
MICRO NUMBER:: 15866511
SPECIMEN QUALITY:: ADEQUATE

## 2023-01-06 ENCOUNTER — Emergency Department: Payer: Medicare Other

## 2023-01-06 ENCOUNTER — Ambulatory Visit (INDEPENDENT_AMBULATORY_CARE_PROVIDER_SITE_OTHER): Payer: Medicare Other | Admitting: Family Medicine

## 2023-01-06 ENCOUNTER — Inpatient Hospital Stay
Admission: EM | Admit: 2023-01-06 | Discharge: 2023-01-10 | DRG: 571 | Disposition: A | Discharge status: Home Health | Payer: Medicare Other | Attending: Internal Medicine | Admitting: Internal Medicine

## 2023-01-06 ENCOUNTER — Other Ambulatory Visit: Payer: Self-pay

## 2023-01-06 ENCOUNTER — Encounter: Payer: Self-pay | Admitting: Family Medicine

## 2023-01-06 VITALS — BP 120/76 | HR 80 | Temp 98.6°F | Ht 63.0 in | Wt 237.0 lb

## 2023-01-06 DIAGNOSIS — R6520 Severe sepsis without septic shock: Secondary | ICD-10-CM | POA: Diagnosis not present

## 2023-01-06 DIAGNOSIS — X16XXXA Contact with hot heating appliances, radiators and pipes, initial encounter: Secondary | ICD-10-CM | POA: Diagnosis present

## 2023-01-06 DIAGNOSIS — Z8616 Personal history of COVID-19: Secondary | ICD-10-CM | POA: Diagnosis not present

## 2023-01-06 DIAGNOSIS — E1151 Type 2 diabetes mellitus with diabetic peripheral angiopathy without gangrene: Secondary | ICD-10-CM | POA: Diagnosis present

## 2023-01-06 DIAGNOSIS — I350 Nonrheumatic aortic (valve) stenosis: Secondary | ICD-10-CM | POA: Diagnosis present

## 2023-01-06 DIAGNOSIS — Z885 Allergy status to narcotic agent status: Secondary | ICD-10-CM

## 2023-01-06 DIAGNOSIS — E1142 Type 2 diabetes mellitus with diabetic polyneuropathy: Secondary | ICD-10-CM | POA: Diagnosis present

## 2023-01-06 DIAGNOSIS — I25118 Atherosclerotic heart disease of native coronary artery with other forms of angina pectoris: Secondary | ICD-10-CM | POA: Diagnosis not present

## 2023-01-06 DIAGNOSIS — Z6841 Body Mass Index (BMI) 40.0 and over, adult: Secondary | ICD-10-CM | POA: Diagnosis not present

## 2023-01-06 DIAGNOSIS — Z794 Long term (current) use of insulin: Secondary | ICD-10-CM | POA: Diagnosis not present

## 2023-01-06 DIAGNOSIS — B9562 Methicillin resistant Staphylococcus aureus infection as the cause of diseases classified elsewhere: Secondary | ICD-10-CM | POA: Diagnosis not present

## 2023-01-06 DIAGNOSIS — Z86711 Personal history of pulmonary embolism: Secondary | ICD-10-CM | POA: Diagnosis present

## 2023-01-06 DIAGNOSIS — I5032 Chronic diastolic (congestive) heart failure: Secondary | ICD-10-CM | POA: Diagnosis not present

## 2023-01-06 DIAGNOSIS — Z888 Allergy status to other drugs, medicaments and biological substances status: Secondary | ICD-10-CM

## 2023-01-06 DIAGNOSIS — N3289 Other specified disorders of bladder: Secondary | ICD-10-CM | POA: Diagnosis not present

## 2023-01-06 DIAGNOSIS — I5042 Chronic combined systolic (congestive) and diastolic (congestive) heart failure: Secondary | ICD-10-CM | POA: Diagnosis not present

## 2023-01-06 DIAGNOSIS — L03317 Cellulitis of buttock: Secondary | ICD-10-CM | POA: Diagnosis not present

## 2023-01-06 DIAGNOSIS — E440 Moderate protein-calorie malnutrition: Secondary | ICD-10-CM

## 2023-01-06 DIAGNOSIS — L03818 Cellulitis of other sites: Secondary | ICD-10-CM | POA: Diagnosis not present

## 2023-01-06 DIAGNOSIS — S31000D Unspecified open wound of lower back and pelvis without penetration into retroperitoneum, subsequent encounter: Secondary | ICD-10-CM

## 2023-01-06 DIAGNOSIS — E1169 Type 2 diabetes mellitus with other specified complication: Secondary | ICD-10-CM | POA: Diagnosis present

## 2023-01-06 DIAGNOSIS — L089 Local infection of the skin and subcutaneous tissue, unspecified: Secondary | ICD-10-CM

## 2023-01-06 DIAGNOSIS — L02212 Cutaneous abscess of back [any part, except buttock]: Secondary | ICD-10-CM | POA: Diagnosis not present

## 2023-01-06 DIAGNOSIS — A419 Sepsis, unspecified organism: Secondary | ICD-10-CM | POA: Diagnosis present

## 2023-01-06 DIAGNOSIS — F32A Depression, unspecified: Secondary | ICD-10-CM | POA: Diagnosis not present

## 2023-01-06 DIAGNOSIS — K219 Gastro-esophageal reflux disease without esophagitis: Secondary | ICD-10-CM | POA: Diagnosis present

## 2023-01-06 DIAGNOSIS — I2510 Atherosclerotic heart disease of native coronary artery without angina pectoris: Secondary | ICD-10-CM | POA: Diagnosis present

## 2023-01-06 DIAGNOSIS — J45909 Unspecified asthma, uncomplicated: Secondary | ICD-10-CM | POA: Diagnosis not present

## 2023-01-06 DIAGNOSIS — E1122 Type 2 diabetes mellitus with diabetic chronic kidney disease: Secondary | ICD-10-CM | POA: Diagnosis present

## 2023-01-06 DIAGNOSIS — R001 Bradycardia, unspecified: Secondary | ICD-10-CM

## 2023-01-06 DIAGNOSIS — E1165 Type 2 diabetes mellitus with hyperglycemia: Secondary | ICD-10-CM | POA: Diagnosis not present

## 2023-01-06 DIAGNOSIS — N179 Acute kidney failure, unspecified: Secondary | ICD-10-CM | POA: Diagnosis not present

## 2023-01-06 DIAGNOSIS — I130 Hypertensive heart and chronic kidney disease with heart failure and stage 1 through stage 4 chronic kidney disease, or unspecified chronic kidney disease: Secondary | ICD-10-CM | POA: Diagnosis present

## 2023-01-06 DIAGNOSIS — E871 Hypo-osmolality and hyponatremia: Secondary | ICD-10-CM | POA: Diagnosis present

## 2023-01-06 DIAGNOSIS — Z86718 Personal history of other venous thrombosis and embolism: Secondary | ICD-10-CM

## 2023-01-06 DIAGNOSIS — G4733 Obstructive sleep apnea (adult) (pediatric): Secondary | ICD-10-CM | POA: Diagnosis present

## 2023-01-06 DIAGNOSIS — I96 Gangrene, not elsewhere classified: Secondary | ICD-10-CM | POA: Diagnosis present

## 2023-01-06 DIAGNOSIS — I5030 Unspecified diastolic (congestive) heart failure: Secondary | ICD-10-CM | POA: Diagnosis present

## 2023-01-06 DIAGNOSIS — Z79899 Other long term (current) drug therapy: Secondary | ICD-10-CM

## 2023-01-06 DIAGNOSIS — Z5986 Financial insecurity: Secondary | ICD-10-CM

## 2023-01-06 DIAGNOSIS — F419 Anxiety disorder, unspecified: Secondary | ICD-10-CM | POA: Diagnosis present

## 2023-01-06 DIAGNOSIS — Z803 Family history of malignant neoplasm of breast: Secondary | ICD-10-CM

## 2023-01-06 DIAGNOSIS — E785 Hyperlipidemia, unspecified: Secondary | ICD-10-CM | POA: Diagnosis present

## 2023-01-06 DIAGNOSIS — K5730 Diverticulosis of large intestine without perforation or abscess without bleeding: Secondary | ICD-10-CM | POA: Diagnosis not present

## 2023-01-06 DIAGNOSIS — N1832 Chronic kidney disease, stage 3b: Secondary | ICD-10-CM | POA: Diagnosis present

## 2023-01-06 DIAGNOSIS — L02818 Cutaneous abscess of other sites: Secondary | ICD-10-CM | POA: Diagnosis not present

## 2023-01-06 DIAGNOSIS — S31000A Unspecified open wound of lower back and pelvis without penetration into retroperitoneum, initial encounter: Principal | ICD-10-CM | POA: Diagnosis present

## 2023-01-06 DIAGNOSIS — M109 Gout, unspecified: Secondary | ICD-10-CM | POA: Diagnosis present

## 2023-01-06 DIAGNOSIS — I480 Paroxysmal atrial fibrillation: Secondary | ICD-10-CM | POA: Diagnosis present

## 2023-01-06 DIAGNOSIS — L03312 Cellulitis of back [any part except buttock]: Secondary | ICD-10-CM | POA: Diagnosis present

## 2023-01-06 DIAGNOSIS — Z7985 Long-term (current) use of injectable non-insulin antidiabetic drugs: Secondary | ICD-10-CM

## 2023-01-06 DIAGNOSIS — G8929 Other chronic pain: Secondary | ICD-10-CM | POA: Diagnosis present

## 2023-01-06 DIAGNOSIS — Z7901 Long term (current) use of anticoagulants: Secondary | ICD-10-CM

## 2023-01-06 DIAGNOSIS — Z22322 Carrier or suspected carrier of Methicillin resistant Staphylococcus aureus: Secondary | ICD-10-CM

## 2023-01-06 DIAGNOSIS — I10 Essential (primary) hypertension: Secondary | ICD-10-CM | POA: Diagnosis present

## 2023-01-06 DIAGNOSIS — I4891 Unspecified atrial fibrillation: Secondary | ICD-10-CM | POA: Diagnosis not present

## 2023-01-06 DIAGNOSIS — Z7982 Long term (current) use of aspirin: Secondary | ICD-10-CM

## 2023-01-06 DIAGNOSIS — D509 Iron deficiency anemia, unspecified: Secondary | ICD-10-CM | POA: Diagnosis present

## 2023-01-06 DIAGNOSIS — Z8249 Family history of ischemic heart disease and other diseases of the circulatory system: Secondary | ICD-10-CM

## 2023-01-06 DIAGNOSIS — E66813 Obesity, class 3: Secondary | ICD-10-CM | POA: Diagnosis present

## 2023-01-06 DIAGNOSIS — Z833 Family history of diabetes mellitus: Secondary | ICD-10-CM

## 2023-01-06 HISTORY — DX: Unspecified open wound of lower back and pelvis without penetration into retroperitoneum, subsequent encounter: S31.000D

## 2023-01-06 LAB — CBC WITH DIFFERENTIAL/PLATELET
Abs Immature Granulocytes: 0.04 10*3/uL (ref 0.00–0.07)
Basophils Absolute: 0 10*3/uL (ref 0.0–0.1)
Basophils Relative: 0 %
Eosinophils Absolute: 0.2 10*3/uL (ref 0.0–0.5)
Eosinophils Relative: 2 %
HCT: 36.3 % — ABNORMAL LOW (ref 39.0–52.0)
Hemoglobin: 12.1 g/dL — ABNORMAL LOW (ref 13.0–17.0)
Immature Granulocytes: 0 %
Lymphocytes Relative: 11 %
Lymphs Abs: 1 10*3/uL (ref 0.7–4.0)
MCH: 29.7 pg (ref 26.0–34.0)
MCHC: 33.3 g/dL (ref 30.0–36.0)
MCV: 89 fL (ref 80.0–100.0)
Monocytes Absolute: 0.4 10*3/uL (ref 0.1–1.0)
Monocytes Relative: 5 %
Neutro Abs: 7.4 10*3/uL (ref 1.7–7.7)
Neutrophils Relative %: 82 %
Platelets: 330 10*3/uL (ref 150–400)
RBC: 4.08 MIL/uL — ABNORMAL LOW (ref 4.22–5.81)
RDW: 14.1 % (ref 11.5–15.5)
WBC: 9 10*3/uL (ref 4.0–10.5)
nRBC: 0 % (ref 0.0–0.2)

## 2023-01-06 LAB — TYPE AND SCREEN
ABO/RH(D): A NEG
Antibody Screen: NEGATIVE

## 2023-01-06 LAB — COMPREHENSIVE METABOLIC PANEL
ALT: 25 U/L (ref 0–44)
AST: 26 U/L (ref 15–41)
Albumin: 3 g/dL — ABNORMAL LOW (ref 3.5–5.0)
Alkaline Phosphatase: 78 U/L (ref 38–126)
Anion gap: 14 (ref 5–15)
BUN: 63 mg/dL — ABNORMAL HIGH (ref 8–23)
CO2: 25 mmol/L (ref 22–32)
Calcium: 8.7 mg/dL — ABNORMAL LOW (ref 8.9–10.3)
Chloride: 92 mmol/L — ABNORMAL LOW (ref 98–111)
Creatinine, Ser: 1.96 mg/dL — ABNORMAL HIGH (ref 0.61–1.24)
GFR, Estimated: 35 mL/min — ABNORMAL LOW (ref >60)
Glucose, Bld: 225 mg/dL — ABNORMAL HIGH (ref 70–99)
Potassium: 4.8 mmol/L (ref 3.5–5.1)
Sodium: 131 mmol/L — ABNORMAL LOW (ref 135–145)
Total Bilirubin: 0.9 mg/dL (ref <1.2)
Total Protein: 6.4 g/dL — ABNORMAL LOW (ref 6.5–8.1)

## 2023-01-06 LAB — LACTIC ACID, PLASMA
Lactic Acid, Venous: 2.9 mmol/L (ref 0.5–1.9)
Lactic Acid, Venous: 1.5 mmol/L (ref 0.5–1.9)
Lactic Acid, Venous: 1.7 mmol/L (ref 0.5–1.9)

## 2023-01-06 LAB — CBG MONITORING, ED: Glucose-Capillary: 276 mg/dL — ABNORMAL HIGH (ref 70–99)

## 2023-01-06 MED ORDER — INSULIN ASPART 100 UNIT/ML IJ SOLN
0.0000 [IU] | Freq: Three times a day (TID) | INTRAMUSCULAR | Status: DC
  Administered 2023-01-07: 3 [IU] via SUBCUTANEOUS
  Administered 2023-01-07: 7 [IU] via SUBCUTANEOUS
  Administered 2023-01-07: 3 [IU] via SUBCUTANEOUS
  Administered 2023-01-08: 15 [IU] via SUBCUTANEOUS
  Administered 2023-01-08 – 2023-01-09 (×2): 7 [IU] via SUBCUTANEOUS
  Administered 2023-01-09: 11 [IU] via SUBCUTANEOUS
  Filled 2023-01-06 (×7): qty 1

## 2023-01-06 MED ORDER — IOHEXOL 350 MG/ML SOLN
75.0000 mL | Freq: Once | INTRAVENOUS | Status: AC | PRN
  Administered 2023-01-06: 75 mL via INTRAVENOUS

## 2023-01-06 MED ORDER — PANTOPRAZOLE SODIUM 40 MG IV SOLR
40.0000 mg | Freq: Two times a day (BID) | INTRAVENOUS | Status: DC
  Administered 2023-01-06 – 2023-01-10 (×8): 40 mg via INTRAVENOUS
  Filled 2023-01-06 (×8): qty 10

## 2023-01-06 MED ORDER — VANCOMYCIN VARIABLE DOSE PER UNSTABLE RENAL FUNCTION (PHARMACIST DOSING): Status: DC

## 2023-01-06 MED ORDER — HEPARIN SODIUM (PORCINE) 5000 UNIT/ML IJ SOLN
5000.0000 [IU] | Freq: Three times a day (TID) | INTRAMUSCULAR | Status: DC
  Administered 2023-01-06 – 2023-01-09 (×8): 5000 [IU] via SUBCUTANEOUS
  Filled 2023-01-06 (×7): qty 1

## 2023-01-06 MED ORDER — VANCOMYCIN HCL IN DEXTROSE 1-5 GM/200ML-% IV SOLN: 1000.0000 mg | Freq: Once | INTRAVENOUS | Status: DC

## 2023-01-06 MED ORDER — VANCOMYCIN HCL IN DEXTROSE 1-5 GM/200ML-% IV SOLN
1000.0000 mg | INTRAVENOUS | Status: DC
  Administered 2023-01-07: 1000 mg via INTRAVENOUS
  Filled 2023-01-06: qty 200

## 2023-01-06 MED ORDER — METRONIDAZOLE 500 MG/100ML IV SOLN
500.0000 mg | Freq: Once | INTRAVENOUS | Status: AC
  Administered 2023-01-06: 500 mg via INTRAVENOUS
  Filled 2023-01-06: qty 100

## 2023-01-06 MED ORDER — SODIUM CHLORIDE 0.9 % IV BOLUS
500.0000 mL | Freq: Once | INTRAVENOUS | Status: AC
  Administered 2023-01-06: 500 mL via INTRAVENOUS

## 2023-01-06 MED ORDER — HYDROCODONE-ACETAMINOPHEN 10-325 MG PO TABS
1.0000 | ORAL_TABLET | Freq: Four times a day (QID) | ORAL | Status: DC | PRN
  Administered 2023-01-07 – 2023-01-10 (×9): 1 via ORAL
  Filled 2023-01-06 (×9): qty 1

## 2023-01-06 MED ORDER — PRAVASTATIN SODIUM 20 MG PO TABS
40.0000 mg | ORAL_TABLET | Freq: Every day | ORAL | Status: DC
Start: 2023-01-06 — End: 2023-01-10
  Administered 2023-01-06 – 2023-01-09 (×4): 40 mg via ORAL
  Filled 2023-01-06 (×4): qty 2

## 2023-01-06 MED ORDER — SODIUM CHLORIDE 0.9 % IV SOLN
2.0000 g | Freq: Once | INTRAVENOUS | Status: AC
Start: 2023-01-06 — End: 2023-01-06
  Administered 2023-01-06: 2 g via INTRAVENOUS
  Filled 2023-01-06: qty 12.5

## 2023-01-06 MED ORDER — GABAPENTIN 300 MG PO CAPS
1200.0000 mg | ORAL_CAPSULE | Freq: Every day | ORAL | Status: DC
  Administered 2023-01-06 – 2023-01-09 (×4): 1200 mg via ORAL
  Filled 2023-01-06 (×4): qty 4

## 2023-01-06 MED ORDER — ALBUTEROL SULFATE (2.5 MG/3ML) 0.083% IN NEBU: 2.5000 mg | INHALATION_SOLUTION | Freq: Four times a day (QID) | RESPIRATORY_TRACT | Status: DC | PRN

## 2023-01-06 MED ORDER — HYDROCODONE-ACETAMINOPHEN 5-325 MG PO TABS
1.0000 | ORAL_TABLET | Freq: Once | ORAL | Status: AC
  Administered 2023-01-06: 1 via ORAL
  Filled 2023-01-06: qty 1

## 2023-01-06 MED ORDER — HYDRALAZINE HCL 20 MG/ML IJ SOLN
10.0000 mg | Freq: Four times a day (QID) | INTRAMUSCULAR | Status: DC | PRN
  Administered 2023-01-08: 10 mg via INTRAVENOUS
  Filled 2023-01-06: qty 1

## 2023-01-06 MED ORDER — ALLOPURINOL 100 MG PO TABS
100.0000 mg | ORAL_TABLET | Freq: Every day | ORAL | Status: DC
  Administered 2023-01-07 – 2023-01-10 (×4): 100 mg via ORAL
  Filled 2023-01-06 (×4): qty 1

## 2023-01-06 MED ORDER — ISOSORBIDE MONONITRATE ER 60 MG PO TB24
30.0000 mg | ORAL_TABLET | Freq: Every day | ORAL | Status: DC
  Administered 2023-01-07: 30 mg via ORAL
  Filled 2023-01-06: qty 1

## 2023-01-06 MED ORDER — ASPIRIN 81 MG PO TBEC
81.0000 mg | DELAYED_RELEASE_TABLET | Freq: Every day | ORAL | Status: DC
  Administered 2023-01-06 – 2023-01-07 (×2): 81 mg via ORAL
  Filled 2023-01-06 (×2): qty 1

## 2023-01-06 MED ORDER — VANCOMYCIN HCL 1250 MG/250ML IV SOLN
1250.0000 mg | INTRAVENOUS | Status: DC
  Administered 2023-01-06: 1250 mg via INTRAVENOUS
  Filled 2023-01-06: qty 250

## 2023-01-06 MED ORDER — NITROGLYCERIN 0.4 MG SL SUBL: 0.4000 mg | SUBLINGUAL_TABLET | SUBLINGUAL | Status: DC | PRN

## 2023-01-06 MED ORDER — FLUTICASONE PROPIONATE 50 MCG/ACT NA SUSP
2.0000 | Freq: Every day | NASAL | Status: DC
  Administered 2023-01-07: 2 via NASAL
  Filled 2023-01-06: qty 16

## 2023-01-06 MED ORDER — LACTATED RINGERS IV SOLN: Freq: Once | INTRAVENOUS | Status: AC

## 2023-01-06 MED ORDER — SODIUM CHLORIDE 0.9 % IV SOLN
2.0000 g | Freq: Two times a day (BID) | INTRAVENOUS | Status: DC
  Administered 2023-01-07 – 2023-01-09 (×5): 2 g via INTRAVENOUS
  Filled 2023-01-06 (×6): qty 12.5

## 2023-01-06 NOTE — Consult Note (Signed)
ED Pharmacy Antibiotic Sign Off An antibiotic consult was received from an ED provider for cefepime and vancomycin per pharmacy dosing for cellulitis. A chart review was completed to assess appropriateness.   The following one time order(s) were placed:  --Cefepime 2 g IV --Vancomycin 1 g IV  Further antibiotic and/or antibiotic pharmacy consults should be ordered by the admitting provider if indicated.   Thank you for allowing pharmacy to be a part of this patient's care.   Tressie Ellis, [facility]  01/06/23 5:46 PM
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
## 2023-01-06 NOTE — Assessment & Plan Note (Signed)
Lab Results  Component Value Date   CREATININE 1.96 (H) 01/06/2023   CREATININE 1.81 (H) 01/01/2023   CREATININE 1.51 (H) 06/25/2022  Mild worsening over the past few months and more so over the past week. Will renally dose medications continue with gentle IV fluid hydration strict I's and O's. We will avoid contrast.

## 2023-01-06 NOTE — H&P (Signed)
History and Physical    PatientMarland Kitchen RUI TOMEK [name]:[id number] DOB: 01/04/1946 DOA: 01/06/2023 DOS: the patient was seen and examined on 01/06/2023 PCP: Donald Boyden, MD  Patient coming from: Home  Chief Complaint  Patient presents with   Wound Infection    HPI: Donald [name] is a 77 y.o. male with past medical history  of allergies to codeine and Actos diabetes mellitus type 2, HFpEF, asthma, Dyslipidemia, iron deficiency anemia presenting with a sacral wound.  Per chart review it seems patient had a fall and was using a heating pad and developed a burn wound and was seen by dermatology, patient does have a history of back surgery and phonic back issues he has had injections in his back as well.  In chart patient was seen by primary care MD on 12./18/2024, per note patient has recently seen as dermatologist for for send secondary sacral burn from using heating pad when he sustained a fall.  Using silver sulfadiazine.  Patient had wound cultures at PCP office and image in chart shows severe wound that is MRSA positive.    In emergency room vitals trend shows: Patient meets severe sepsis criteria. Vitals:   01/06/23 1551  BP: (!) 143/52  Pulse: 60  Temp: 97.8 F (36.6 C)  Resp: 20  SpO2: 100%  TempSrc: Oral  EKG: Ordered and pending CT imaging: Ordered and pending.  Suspect patient may have osteomyelitis. Labs are notable for : - Metabolic panel showing hyponatremia of 131 chloride 92, glucose 225-last A1c of 7.3 on 01/01/2023, BUN 63, AKI on CKD stage IIIb creatinine of 1.96 EGFR of 35 normal LFTs except for hypoalbuminemia at 3.0 and total protein of 6.4. - Lactic of 2.9. - CBC showing normal white count normal platelet count chronic anemia with a hemoglobin of 12.1 normal neutrophil percentage. - WOUND CX FROM 01/01/2023 : Recent Results (from the past 720 hours)  WOUND CULTURE     Status: Abnormal   Collection Time: 01/01/23  1:27 PM   Specimen: Wound  Result  Value Ref Range Status   MICRO NUMBER: [id number]  Final   SPECIMEN QUALITY: Adequate  Final   SOURCE: WOUND (SITE NOT SPECIFIED)  Final   STATUS: FINAL  Final   GRAM STAIN:   Final    No organisms or white blood cells seen No epithelial cells seen   ISOLATE 1: methicillin resistant Staphylococcus aureus (A)  Final    Comment: Heavy growth of Methicillin resistant Staphylococcus aureus (MRSA)      Susceptibility   Methicillin resistant staphylococcus aureus - AEROBIC CULT, GRAM STAIN POSITIVE 1    VANCOMYCIN <=0.5 Sensitive     CIPROFLOXACIN >=8 Resistant     CLINDAMYCIN <=0.25 Sensitive     LEVOFLOXACIN 4 Intermediate     ERYTHROMYCIN <=0.25 Sensitive     GENTAMICIN <=0.5 Sensitive     OXACILLIN* NR Resistant      * Oxacillin-resistant staphylococci are resistant to all currently available beta-lactam antimicrobial agents with the possible exception of ceftaroline.     TETRACYCLINE <=1 Sensitive     TRIMETH/SULFA* >=320 Resistant      * Oxacillin-resistant staphylococci are resistant to all currently available beta-lactam antimicrobial agents with the possible exception of ceftaroline. Legend: S = Susceptible  I = Intermediate R = Resistant  NS = Not susceptible SDD = Susceptible Dose Dependent * = Not Tested  NR = Not Reported **NN = See Therapy Comments    In the ED pt received: Medications  metroNIDAZOLE (  FLAGYL) IVPB 500 mg (500 mg Intravenous New Bag/Given 01/06/23 1839)  vancomycin (VANCOCIN) IVPB 1000 mg/200 mL premix (has no administration in time range)  albuterol (VENTOLIN HFA) 108 (90 Base) MCG/ACT inhaler 2 puff (has no administration in time range)  allopurinol (ZYLOPRIM) tablet 100 mg (has no administration in time range)  fluticasone (FLONASE) 50 MCG/ACT nasal spray 2 spray (has no administration in time range)  gabapentin (NEURONTIN) tablet 1,200 mg (has no administration in time range)  isosorbide mononitrate (IMDUR) 24 hr tablet 30 mg (has no administration  in time range)  pravastatin (PRAVACHOL) tablet 40 mg (has no administration in time range)  nitroGLYCERIN (NITROSTAT) SL tablet 0.4 mg (has no administration in time range)  aspirin EC tablet 81 mg (has no administration in time range)  HYDROcodone-acetaminophen (NORCO) 10-325 MG per tablet 1 tablet (has no administration in time range)  insulin aspart (novoLOG) injection 0-20 Units (has no administration in time range)  lactated ringers infusion (has no administration in time range)  heparin injection 5,000 Units (has no administration in time range)  pantoprazole (PROTONIX) injection 40 mg (has no administration in time range)  HYDROcodone-acetaminophen (NORCO/VICODIN) 5-325 MG per tablet 1 tablet (1 tablet Oral Given 01/06/23 1642)  iohexol (OMNIPAQUE) 350 MG/ML injection 75 mL (75 mLs Intravenous Contrast Given 01/06/23 1755)  sodium chloride 0.9 % bolus 500 mL (500 mLs Intravenous Bolus 01/06/23 1800)  ceFEPIme (MAXIPIME) 2 g in sodium chloride 0.9 % 100 mL IVPB (0 g Intravenous Stopped 01/06/23 1839)    Review of Systems  Musculoskeletal:  Positive for back pain.       Sacral wound.   All other systems reviewed and are negative.  Past Medical History:  Diagnosis Date   (HFpEF) heart failure with preserved ejection fraction (HCC)    a. 09/2018 Echo: EF 60-65%. PASP . Mild-mod LAE. Mild MR/TR.   ANEMIA-IRON DEFICIENCY 07/26/2006   Anxiety    ASTHMA 07/26/2006   Asthma    Back pain, chronic    Bell's palsy    CKD (chronic kidney disease), stage III (HCC)    COLONIC POLYPS, HX OF 07/26/2006   COVID-19 virus infection 01/2020   DEPRESSION 03/14/2009   DIABETES MELLITUS, TYPE II 07/26/2006   DISC DISEASE, LUMBAR 10/05/2007   DVT 12/03/2007   DYSLIPIDEMIA 04/13/2009   Dyspnea    when gets up and walks around and back is hurting really bad -only Shortness of breath  then   GERD 07/26/2006   Gout    Heart murmur    History of kidney stones    HYPERTENSION 07/26/2006    INSOMNIA 08/21/2007   Neuropathy    Nonobstructive CAD (coronary artery disease)    a. 2012 Cath: no high grade stenosis; b. 2018 MV: No ischemia. Attenuation artifact.    OBSTRUCTIVE SLEEP APNEA 12/03/2007   Use C-PAP   PERIPHERAL NEUROPATHY 07/26/2006   Pernicious anemia 11/20/2006   PULMONARY EMBOLISM 10/05/2007   Syncope and collapse 10/30/2020   zio patch 11/2020: Predominant sinus bradycardia with an average rate of 54 betas per minute.  Type I second degree heart block was occasionally present w/o significant pauses. Short lived SVT x1 with occasional symptomatic PVCs, ventricular trigeminy present     Vertigo    Past Surgical History:  Procedure Laterality Date   APPENDECTOMY  1968   BACK SURGERY  1977, 06/14/2008   Dr. Dutch Quint at [facility] (06/10)   CARDIAC CATHETERIZATION     CARPAL TUNNEL RELEASE Right 10/2017   CATARACT  EXTRACTION W/PHACO Left 05/30/2014   Procedure: CATARACT EXTRACTION PHACO AND INTRAOCULAR LENS PLACEMENT (IOC);  Surgeon: Sallee Lange, MD;  Location: [facility];  Service: Ophthalmology;  Laterality: Left;  [name] 01:20 AP% 23.7 CDE 31.86   CATARACT EXTRACTION W/PHACO Right 09/06/2021   Procedure: CATARACT EXTRACTION PHACO AND INTRAOCULAR LENS PLACEMENT (IOC) RIGHT DIABETIC VISION BLUE OMIDRIA MALYUGIN IRIS HOOKS;  Surgeon: Estanislado Pandy, MD;  Location: [facility];  Service: Ophthalmology;  Laterality: Right;  14.65 2.11.1   COLONOSCOPY  10/2002   HP, SSA, TA, rpt 3 yrs ([place])   COLONOSCOPY  01/2005   diverticulosis rpt 5 yrs ([place])    EPIDURAL BLOCK INJECTION Bilateral 06/2021   bilat L5 nerve block with benefit ([place])   IR NEPHROSTOMY PLACEMENT LEFT  10/19/2018   IR URETERAL STENT LEFT NEW ACCESS W/O SEP NEPHROSTOMY CATH  10/19/2018   LEFT HEART CATH AND CORONARY ANGIOGRAPHY N/A 11/01/2020   Procedure: LEFT HEART CATH AND CORONARY ANGIOGRAPHY;  Surgeon: Yvonne Kendall, MD;  Location: [facility];  Service:  Cardiovascular;  Laterality: N/A;   LITHOTRIPSY     X 2   NEPHROLITHOTOMY Left 10/19/2018   Procedure: NEPHROLITHOTOMY PERCUTANEOUS;  Surgeon: Marcine Matar, MD;  Location: [facility];  Service: Urology;  Laterality: Left;  3 HRS   PARATHYROIDECTOMY Right 04/18/2016   PARATHYROIDECTOMY for cyst Bud Face, MD)   SHOULDER ARTHROSCOPY W/ ROTATOR CUFF REPAIR Right    TONSILLECTOMY      reports that he has never smoked. He has never used smokeless tobacco. He reports that he does not drink alcohol and does not use drugs.  Allergies  Allergen Reactions   Codeine Other (See Comments)    REACTION: chest pain   Pioglitazone Other (See Comments)    REACTION to Actos: swelling in ankles    Family History  Problem Relation Age of Onset   Cancer Mother        Breast Cancer   Cancer Sister        Breast Cancer   Diabetes Father    Heart disease Father    Diabetes Paternal Grandfather     Prior to Admission medications   Medication Sig Start Date End Date Taking? Authorizing Provider  albuterol (VENTOLIN HFA) 108 (90 Base) MCG/ACT inhaler Inhale 2 puffs into the lungs every 6 (six) hours as needed for wheezing or shortness of breath. 02/04/20   Donald Boyden, MD  allopurinol (ZYLOPRIM) 300 MG tablet TAKE ONE TABLET BY MOUTH EVERY DAY 07/30/22   Donald Boyden, MD  ALPRAZolam Prudy Feeler) 1 MG tablet TAKE 1 TABLET BY MOUTH AT BEDTIME IF NEEDED FOR SLEEP. Patient taking differently: Take 0.5 mg by mouth at bedtime. 09/20/21   Donald Boyden, MD  aspirin 81 MG EC tablet Take 81 mg by mouth daily. Swallow whole.    [provider]  cephALEXin (KEFLEX) 500 MG capsule Take 1 capsule (500 mg total) by mouth 4 (four) times daily. 01/01/23   Donald Boyden, MD  Cholecalciferol (VITAMIN D3) 25 MCG (1000 UT) CAPS Take 1 capsule (1,000 Units total) by mouth daily. 03/17/20   Donald Boyden, MD  colchicine 0.6 MG tablet TAKE 1 TABLET BY MOUTH DAILY AS NEEDED FOR GOUT GLARE. ON  FIRST DAY OF GOUT FLARE,MAY TAKE 2 TABLETS AT ONCE AS DIRECTED 10/18/21   Donald Boyden, MD  Continuous Glucose Sensor (FREESTYLE LIBRE 2 SENSOR) MISC 1 Device by Does not apply route every 14 (fourteen) days. 09/20/22   Shamleffer, Konrad Dolores, MD  cyanocobalamin (  VITAMIN B12) 1000 MCG/ML injection INJECT INTRAMUSCULARLY EVERY 30 DAYS 04/23/22   Donald Boyden, MD  cyclobenzaprine (FLEXERIL) 10 MG tablet Take 1 tablet (10 mg total) by mouth 3 (three) times daily as needed for muscle spasms. 11/21/22   Karie Schwalbe, MD  doxazosin (CARDURA) 1 MG tablet TAKE ONE TABLET BY MOUTH TWICE DAILY 07/09/22   Antonieta Iba, MD  doxycycline (VIBRA-TABS) 100 MG tablet Take 1 tablet (100 mg total) by mouth 2 (two) times daily. 01/01/23   Donald Boyden, MD  fluticasone [facility]) 50 MCG/ACT nasal spray Place 2 sprays into both nostrils daily. 06/18/19   Emi Belfast, FNP  furosemide (LASIX) 80 MG tablet TAKE 1 TABLET BY MOUTH EVERY DAY. MAY TAKE AN EXTRA PILL FOR SWELLING. 11/07/22   Antonieta Iba, MD  gabapentin (NEURONTIN) 600 MG tablet TAKE TWO TABLETS AT BEDTIME 09/23/22   Donald Boyden, MD  glucose blood (GE100 BLOOD GLUCOSE TEST) test strip TEST FOUR TIMES DAILY 08/30/21   Shamleffer, Konrad Dolores, MD  HYDROcodone-acetaminophen (NORCO) 10-325 MG tablet Take 1 tablet by mouth every 6 (six) hours as needed for moderate pain ((typically once to twice daily if needed)).  12/19/17   Donald Boyden, MD  isosorbide mononitrate (IMDUR) 30 MG 24 hr tablet TAKE ONE TABLET BY MOUTH DAILY 02/26/22   Antonieta Iba, MD  losartan (COZAAR) 25 MG tablet TAKE ONE TABLET BY MOUTH AT BEDTIME 10/29/22   Donald Boyden, MD  lovastatin (MEVACOR) 40 MG tablet TAKE TWO TABLETS BY MOUTH AT BEDTIME 01/01/23   Antonieta Iba, MD  meclizine (ANTIVERT) 25 MG tablet Take 25 mg by mouth 3 (three) times daily as needed for dizziness.    [provider]  Naphazoline-Pheniramine (OPCON-A OP)  Place 1 drop into both eyes 3 (three) times daily as needed (itchy eyes.).     [provider]  nitroGLYCERIN (NITROSTAT) 0.4 MG SL tablet Place 1 tablet (0.4 mg total) under the tongue every 5 (five) minutes as needed for chest pain. 12/17/21   Gollan, Tollie Pizza, MD  NOVOLIN N 100 UNIT/ML injection INJECT 160-170 UNITS TOTAL INTO THE SKIN AT BEDTIME 01/05/23   Shamleffer, Konrad Dolores, MD  NOVOLIN R 100 UNIT/ML injection INJECT 140 UNITS IN THE MORNING, 140 UNITS IN THE AFTERNOON, AND 160-180 UNITS IN THE EVENING USE BEFORE A MEAL 11/29/22   Shamleffer, Konrad Dolores, MD  ondansetron (ZOFRAN) 4 MG tablet Take 1 tablet (4 mg total) by mouth 3 (three) times daily as needed for nausea. 12/31/21   Donald Boyden, MD  Potassium Citrate 15 MEQ (1620 MG) TBCR Take 1 tablet by mouth 3 (three) times daily. Takes 2 tablets daily    [provider]  Semaglutide, 1 MG/DOSE, 4 MG/3ML SOPN Inject 1 mg as directed once a week. 12/21/22   Donald Boyden, MD  Sennosides-Docusate Sodium (STOOL SOFTENER & LAXATIVE PO) Take 2 tablets by mouth at bedtime.    [provider]  silver sulfADIAZINE (SILVADENE) 1 % cream Apply topically 2 (two) times daily. 12/25/22   [provider]  Syringe/Needle, Disp, (SYRINGE 3CC/22GX1-1/2") 22G X 1-1/2" 3 ML MISC Use to administer monthly b12 shots 03/18/20   Donald Boyden, MD     Vitals:   01/06/23 1551  BP: (!) 143/52  Pulse: 60  Resp: 20  Temp: 97.8 F (36.6 C)  TempSrc: Oral  SpO2: 100%   Physical Exam Vitals and nursing note reviewed.  Constitutional:      General: He is not  in acute distress.    Appearance: He is obese. He is not ill-appearing.  HENT:     Head: Normocephalic and atraumatic.     Right Ear: Hearing normal.     Left Ear: Hearing normal.     Nose: No nasal deformity.     Mouth/Throat:     Lips: Pink.     Tongue: No lesions.  Eyes:     General: Lids are normal.     Extraocular Movements: Extraocular  movements intact.  Cardiovascular:     Rate and Rhythm: Normal rate and regular rhythm.     Heart sounds: Normal heart sounds.  Pulmonary:     Effort: Pulmonary effort is normal.     Breath sounds: Normal breath sounds.  Abdominal:     General: Bowel sounds are normal. There is no distension.     Palpations: Abdomen is soft. There is no mass.     Tenderness: There is no abdominal tenderness.  Musculoskeletal:     Right lower leg: No edema.     Left lower leg: No edema.  Skin:    General: Skin is warm.  Neurological:     General: No focal deficit present.     Mental Status: He is alert and oriented to person, place, and time.     Cranial Nerves: Cranial nerves 2-12 are intact.  Psychiatric:        Attention and Perception: Attention normal.        Mood and Affect: Mood normal.        Speech: Speech normal.        Behavior: Behavior normal. Behavior is cooperative.       Labs on Admission: I have personally reviewed following labs and imaging studies Results for orders placed or performed during the hospital encounter of 01/06/23 (from the past 24 hours)  CBC with Differential     Status: Abnormal   Collection Time: 01/06/23  4:12 PM  Result Value Ref Range   WBC 9.0 4.0 - 10.5 K/uL   RBC 4.08 (L) 4.22 - 5.81 MIL/uL   Hemoglobin 12.1 (L) 13.0 - 17.0 g/dL   HCT 01.0 (L) 27.2 - 53.6 %   MCV 89.0 80.0 - 100.0 fL   MCH 29.7 26.0 - 34.0 pg   MCHC 33.3 30.0 - 36.0 g/dL   RDW 64.4 03.4 - 74.2 %   Platelets 330 150 - 400 K/uL   nRBC 0.0 0.0 - 0.2 %   Neutrophils Relative % 82 %   Neutro Abs 7.4 1.7 - 7.7 K/uL   Lymphocytes Relative 11 %   Lymphs Abs 1.0 0.7 - 4.0 K/uL   Monocytes Relative 5 %   Monocytes Absolute 0.4 0.1 - 1.0 K/uL   Eosinophils Relative 2 %   Eosinophils Absolute 0.2 0.0 - 0.5 K/uL   Basophils Relative 0 %   Basophils Absolute 0.0 0.0 - 0.1 K/uL   Immature Granulocytes 0 %   Abs Immature Granulocytes 0.04 0.00 - 0.07 K/uL  Comprehensive metabolic panel      Status: Abnormal   Collection Time: 01/06/23  4:12 PM  Result Value Ref Range   Sodium 131 (L) 135 - 145 mmol/L   Potassium 4.8 3.5 - 5.1 mmol/L   Chloride 92 (L) 98 - 111 mmol/L   CO2 25 22 - 32 mmol/L   Glucose, Bld 225 (H) 70 - 99 mg/dL   BUN 63 (H) 8 - 23 mg/dL   Creatinine, Ser 5.95 (H) 0.61 -  1.24 mg/dL   Calcium 8.7 (L) 8.9 - 10.3 mg/dL   Total Protein 6.4 (L) 6.5 - 8.1 g/dL   Albumin 3.0 (L) 3.5 - 5.0 g/dL   AST 26 15 - 41 U/L   ALT 25 0 - 44 U/L   Alkaline Phosphatase 78 38 - 126 U/L   Total Bilirubin 0.9 <1.2 mg/dL   GFR, Estimated 35 (L) >60 mL/min   Anion gap 14 5 - 15  Lactic acid, plasma     Status: Abnormal   Collection Time: 01/06/23  4:12 PM  Result Value Ref Range   Lactic Acid, Venous 2.9 (HH) 0.5 - 1.9 mmol/L    CBC:    Latest Ref Rng & Units 01/06/2023    4:12 PM 01/01/2023    1:27 PM 06/25/2022   10:58 AM  CBC  WBC 4.0 - 10.5 K/uL 9.0  16.7  8.0   Hemoglobin 13.0 - 17.0 g/dL 95.6  21.3  08.6   Hematocrit 39.0 - 52.0 % 36.3  35.8  41.4   Platelets 150 - 400 K/uL 330  302.0  188.0    Basic Metabolic Panel: Recent Labs  Lab 01/01/23 1327 01/06/23 1612  NA 133* 131*  K 4.7 4.8  CL 92* 92*  CO2 30 25  GLUCOSE 215* 225*  BUN 46* 63*  CREATININE 1.81* 1.96*  CALCIUM 8.8 8.7*   Creatinine: Lab Results  Component Value Date   CREATININE 1.96 (H) 01/06/2023   CREATININE 1.81 (H) 01/01/2023   CREATININE 1.51 (H) 06/25/2022   Estimated Creatinine Clearance: 35.5 mL/min (A) (by C-G formula based on SCr of 1.96 mg/dL (H)). Liver Function Tests:    Latest Ref Rng & Units 01/06/2023    4:12 PM 01/01/2023    1:27 PM 06/25/2022   10:58 AM  Hepatic Function  Total Protein 6.5 - 8.1 g/dL 6.4  6.2  6.3   Albumin 3.5 - 5.0 g/dL 3.0  3.2  4.1   AST 15 - 41 U/L 26  38  11   ALT 0 - 44 U/L 25  32  11   Alk Phosphatase 38 - 126 U/L 78  106  88   Total Bilirubin <1.2 mg/dL 0.9  1.8  1.2    Urinalysis: OLD    Component Value Date/Time    COLORURINE STRAW (A) 10/30/2020 1436   APPEARANCEUR CLEAR (A) 10/30/2020 1436   LABSPEC 1.005 10/30/2020 1436   PHURINE 6.0 10/30/2020 1436   GLUCOSEU NEGATIVE 10/30/2020 1436   GLUCOSEU NEGATIVE 10/09/2015 1349   HGBUR MODERATE (A) 10/30/2020 1436   BILIRUBINUR NEGATIVE 10/30/2020 1436   KETONESUR NEGATIVE 10/30/2020 1436   PROTEINUR 30 (A) 10/30/2020 1436   UROBILINOGEN 0.2 10/09/2015 1349   NITRITE NEGATIVE 10/30/2020 1436   LEUKOCYTESUR NEGATIVE 10/30/2020 1436     Unresulted Labs (From admission, onward)     Start     Ordered   01/07/23 0500  Protime-INR  Tomorrow morning,   R        01/06/23 1922   01/07/23 0500  Cortisol-am, blood  Tomorrow morning,   R        01/06/23 1922   01/07/23 0500  Procalcitonin  Tomorrow morning,   R       References:    Procalcitonin Lower Respiratory Tract Infection AND Sepsis Procalcitonin Algorithm   01/06/23 1922   01/07/23 0500  CBC  Tomorrow morning,   R        01/06/23 1922   01/07/23 0500  Comprehensive metabolic panel  Tomorrow morning,   R        01/06/23 1922   01/06/23 1923  Type and screen  Once,   R        01/06/23 1922   01/06/23 1920  CBC  (heparin)  Once,   R       Comments: Baseline for heparin therapy IF NOT ALREADY DRAWN.  Notify MD if PLT < 100 K.    01/06/23 1922   01/06/23 1920  Creatinine, serum  (heparin)  Once,   R       Comments: Baseline for heparin therapy IF NOT ALREADY DRAWN.    01/06/23 1922   01/06/23 1919  Lactic acid, plasma  (Lactic Acid)  STAT Now then every 3 hours,   STAT      01/06/23 1922   01/06/23 1905  Blood culture (routine x 2)  BLOOD CULTURE X 2,   STAT      01/06/23 1904   01/06/23 1558  Lactic acid, plasma  (Lactic Acid)  Now then every 2 hours,   STAT      01/06/23 1557            Radiological Exams on Admission: CT PELVIS W CONTRAST Result Date: 01/06/2023 CLINICAL DATA:  Soft tissue infection suspected. Wound in the coccygeal area developing about 2 weeks ago. EXAM: CT PELVIS  WITH CONTRAST TECHNIQUE: Multidetector CT imaging of the pelvis was performed using the standard protocol following the bolus administration of intravenous contrast. RADIATION DOSE REDUCTION: This exam was performed according to the departmental dose-optimization program which includes automated exposure control, adjustment of the mA and/or kV according to patient size and/or use of iterative reconstruction technique. CONTRAST:  75mL OMNIPAQUE IOHEXOL 350 MG/ML SOLN COMPARISON:  03/07/2020 FINDINGS: Urinary Tract: Diffuse bladder wall thickening may indicate cystitis or outlet obstruction. Correlate with urinalysis. No stones or filling defects. Bowel: Visualized portions of large and small bowel are not abnormally distended. Sigmoid colonic diverticulosis without evidence of acute diverticulitis. Vascular/Lymphatic: Aortoiliac calcification.  No aneurysm. Reproductive:  Prostate gland is not enlarged. Other: Diffuse soft tissue infiltration with some calcification in the anterior abdominal wall fat. This is similar to prior study and likely represents postoperative change or chronic scarring. Soft tissue infiltration in the subcutaneous fat dorsal to the coccyx with poorly defined increased density possibly representing developing collection measuring 2.6 x 5.6 cm. This may represent cellulitis or early abscess. Underlying bone appears intact. No bone erosion to suggest osteomyelitis. Musculoskeletal: Degenerative changes in the lower lumbar spine. Posterior laminectomy with posterior fixation and intervertebral fusion at L4-5. Degenerative changes in the hips. IMPRESSION: 1. Soft tissue infiltration with possible developing collection in the subcutaneous fat over the coccyx. No evidence of osteomyelitis. 2. Bladder wall thickening may indicate cystitis or outlet obstruction. Correlate with urinalysis. 3. Aortic atherosclerosis. Electronically Signed   By: Burman Nieves M.D.   On: 01/06/2023 19:09    Data  Reviewed: Relevant notes from primary care and specialist visits, past discharge summaries as available in EHR, including Care Everywhere. Prior diagnostic testing as pertinent to current admission diagnoses Updated medications and problem lists for reconciliation ED course, including vitals, labs, imaging, treatment and response to treatment Triage notes, nursing and pharmacy notes and ED provider's notes Notable results as noted in HPI  Assessment and Plan: * Sacral wound, subsequent encounter Patient has a sacral wound/sacral decubitus secondary to what started as a burn and now is infected.  Clinically it is unclear  if patient may have any bony involvement or developing osteomyelitis underlying this wound.  Wound is MRSA positive and will continue with broad-spectrum antibiotic coverage with vancomycin included with pseudomonal coverage as well as patient is a diabetic. Will need aggressive wound care and IV antibiotics. A1c trend has been fairly well-controlled and has been in the sevens. Gen surg has been consulted- Addendum .  Severe sepsis [facility]) Patient does meet severe sepsis criteria, will start with gentle IV fluid hydration patient is not hemodynamically compromised as far as blood pressure and will give ivf evenly rather than bolus as his EF is reduced based on his cardiology note in 06/2022 and -pt has global hypokinesis.  Will follow with additional supportive measures IV fluids IV antibiotics.  Acute kidney injury superimposed on stage 3b chronic kidney disease (HCC) Lab Results  Component Value Date   CREATININE 1.96 (H) 01/06/2023   CREATININE 1.81 (H) 01/01/2023   CREATININE 1.51 (H) 06/25/2022  Mild worsening over the past few months and more so over the past week. Will renally dose medications continue with gentle IV fluid hydration strict I's and O's. We will avoid contrast.   Paroxysmal atrial fibrillation (HCC) Home regimen again consists of aspirin 81,  Imdur. EKG today is pending patient is currently in sinus rhythm rate controlled. CHA2DS2/VAS Stroke Risk Points  Current as of 14 minutes ago     8 >= 2 Points: High Risk  1 to 1.99 Points: Medium Risk  0 Points: Low Risk    No Change      Details    This score determines the patient's risk of having a stroke if the  patient has atrial fibrillation.       Points Metrics  1 Has Congestive Heart Failure:  Yes    Current as of 14 minutes ago  1 Has Vascular Disease:  Yes    Current as of 14 minutes ago  1 Has Hypertension:  Yes    Current as of 14 minutes ago  2 Age:  1    Current as of 14 minutes ago  1 Has Diabetes Excluding Gestational Diabetes:  Yes    Current as of 14 minutes ago  2 Had Stroke:  No  Had TIA:  No  Had Thromboembolism:  Yes    Current as of 14 minutes ago  0 Male:  No    Current as of 14 minutes ago  Patient currently declines Eliquis per cardiology note.  Is in sinus rhythm.  Suspect patient has sleep apnea will also advise strongly to obtain sleep study and identify any underlying OSA which could also contribute to PAF.   Heart failure with preserved ejection fraction [facility]) Currently patient is euvolemic.,  Follows with cardiology Dr. [name].  With AKI that has been developing since the past week we will currently hold patient's losartan, Lasix, will continue patient on Imdur and monitor. Strict I's and O's and daily weights . Most recent echo per cardiology notes shows EF of 45 to 50% with global hypokinesis and grade 3 diastolic dysfunction.  CAD (coronary artery disease) Patient was last seen by cardiology group [facility] Dr. [name] on June 24, 2022. Patient has nonobstructive CAD based on recent cardiac cath in October 2022 currently on present medical management.  OSA (obstructive sleep apnea) CPAP home settings.  GERD IV PPIs.  Aspiration precaution.  History of pulmonary embolus (PE) Patient has been on Coumadin in the past.  Patient declines  Eliquis. Discussed with patient and advise  and guide as needed.  Essential hypertension Vitals:   01/06/23 1551  BP: (!) 143/52  Will currently be holding losartan and Lasix and continue patient on Imdur and as needed hydralazine as needed.   Iron deficiency anemia    Latest Ref Rng & Units 01/06/2023    4:12 PM 01/01/2023    1:27 PM 06/25/2022   10:58 AM  CBC  WBC 4.0 - 10.5 K/uL 9.0  16.7  8.0   Hemoglobin 13.0 - 17.0 g/dL 32.4  40.1  02.7   Hematocrit 39.0 - 52.0 % 36.3  35.8  41.4   Platelets 150 - 400 K/uL 330  302.0  188.0   Mild, currently stable, patient is currently receiving B12 B12 injections.   Dyslipidemia associated with type 2 diabetes mellitus (HCC) Patient is currently on lovastatin which we will continue.   DVT prophylaxis:  Heparin   Consults:  None   Advance Care Planning:    Code Status: Full Code   Family Communication:  None   Disposition Plan:  Home   Severity of Illness: The appropriate patient status for this patient is INPATIENT. Inpatient status is judged to be reasonable and necessary in order to provide the required intensity of service to ensure the patient's safety. The patient's presenting symptoms, physical exam findings, and initial radiographic and laboratory data in the context of their chronic comorbidities is felt to place them at high risk for further clinical deterioration. Furthermore, it is not anticipated that the patient will be medically stable for discharge from the hospital within 2 midnights of admission.   * I certify that at the point of admission it is my clinical judgment that the patient will require inpatient hospital care spanning beyond 2 midnights from the point of admission due to high intensity of service, high risk for further deterioration and high frequency of surveillance required.*  Author: Gertha Calkin, MD 01/06/2023 7:23 PM  For on call review [email or web address].   Orders Placed This Encounter   Procedures   Critical Care   Blood culture (routine x 2)   CT PELVIS W CONTRAST   CBC with Differential   Comprehensive metabolic panel   Lactic acid, plasma   Lactic acid, plasma   Protime-INR   Cortisol-am, blood   Procalcitonin   CBC   Creatinine, serum   CBC   Comprehensive metabolic panel   Diet Carb Modified Fluid consistency: Thin; Room service appropriate? Yes   DO NOT delay antibiotics if unable to obtain blood culture.   Apply Diabetes Mellitus Care Plan   STAT CBG when hypoglycemia is suspected. If treated, recheck every 15 minutes after each treatment until CBG >/= 70 mg/dl   Refer to Hypoglycemia Protocol Sidebar Report for treatment of CBG < 70 mg/dl   No HS correction Insulin   Strict intake and output   Daily weights   Vital signs   Notify physician (specify)   Refer to Sidebar Report Refer to ICU, Med-Surg, Progressive, and Step-Down Mobility Protocol Sidebars   If lactate (lactic acid) >2, verify repeat lactic acid order has been placed to be drawn   Document vital signs within 1-hour of fluid bolus completion and notify provider of bolus completion   Vital signs   Vital signs   RN to call RRT (rapid response team)   Apply Sepsis Care Plan   Refer to Sidebar Report: Sepsis Bundle ED/IP   Assess and Document Glasgow Coma Scale   Initiate Oral Care Protocol   Initiate  Carrier Fluid Protocol   RN may order General Admission PRN Orders utilizing "General Admission PRN medications" (through manage orders) for the following patient needs: allergy symptoms (Claritin), cold sores (Carmex), cough (Robitussin DM), eye irritation (Liquifilm Tears), hemorrhoids (Tucks), indigestion (Maalox), minor skin irritation (Hydrocortisone Cream), muscle pain Romeo Apple Gay), nose irritation (saline nasal spray) and sore throat (Chloraseptic spray).   SCDs   Cardiac Monitoring - Continuous Indefinite   Mobility Protocol: limited/restricted mobility   Full code   Consult to hospitalist    vancomycin per pharmacy consult   CeFEPIme (MAXIPIME) per pharmacy consult            Pulse oximetry (single)   EKG 12-Lead   EKG 12-Lead   Type and screen   Insert 2nd peripheral IV if not already present.   Admit to Inpatient (patient's expected length of stay will be greater than 2 midnights or inpatient only procedure)   Aspiration precautions   Fall precautions

## 2023-01-06 NOTE — Assessment & Plan Note (Signed)
Patient has a sacral wound/sacral decubitus secondary to what started as a burn and now is infected.  Clinically it is unclear if patient may have any bony involvement or developing osteomyelitis underlying this wound.  Wound is MRSA positive and will continue with broad-spectrum antibiotic coverage with vancomycin included with pseudomonal coverage as well as patient is a diabetic. Will need aggressive wound care and IV antibiotics. A1c trend has been fairly well-controlled and has been in the sevens.

## 2023-01-06 NOTE — ED Triage Notes (Signed)
Pt to ED via POV from home. Pt reports wound on lower back that has been getting tx with Dr. Sharen Hones. Out pt efforts unsuccessful and wound is getting worse. Pt sent for IV antibiotics.

## 2023-01-06 NOTE — Assessment & Plan Note (Signed)
CPAP home settings.

## 2023-01-06 NOTE — ED Notes (Signed)
Lab called again to obtain labs and repeat lactic, this RN was told "she will be there when she can", Lab made aware this request was made an hour ago.

## 2023-01-06 NOTE — ED Notes (Signed)
Pt to CT

## 2023-01-06 NOTE — Patient Instructions (Addendum)
Go to [facility] ER today for further evaluation/treatment of poorly healing wound We will call them to let them know you're coming.

## 2023-01-06 NOTE — Assessment & Plan Note (Signed)
Patient does meet severe sepsis criteria, will start with gentle IV fluid hydration patient is not hemodynamically compromised as far as blood pressure and will give ivf evenly rather than bolus as his EF is reduced based on his cardiology note in 06/2022 and -pt has global hypokinesis.  Will follow with additional supportive measures IV fluids IV antibiotics.

## 2023-01-06 NOTE — Assessment & Plan Note (Signed)
Currently patient is euvolemic.,  Follows with cardiology Dr. [name].  With AKI that has been developing since the past week we will currently hold patient's losartan, Lasix, will continue patient on Imdur and monitor. Strict I's and O's and daily weights . Most recent echo per cardiology notes shows EF of 45 to 50% with global hypokinesis and grade 3 diastolic dysfunction.

## 2023-01-06 NOTE — Assessment & Plan Note (Signed)
Patient was last seen by cardiology group [facility] Dr. [name] on June 24, 2022. Patient has nonobstructive CAD based on recent cardiac cath in October 2022 currently on present medical management.

## 2023-01-06 NOTE — Assessment & Plan Note (Signed)
    Latest Ref Rng & Units 01/06/2023    4:12 PM 01/01/2023    1:27 PM 06/25/2022   10:58 AM  CBC  WBC 4.0 - 10.5 K/uL 9.0  16.7  8.0   Hemoglobin 13.0 - 17.0 g/dL 28.4  13.2  44.0   Hematocrit 39.0 - 52.0 % 36.3  35.8  41.4   Platelets 150 - 400 K/uL 330  302.0  188.0   Mild, currently stable, patient is currently receiving B12 B12 injections.

## 2023-01-06 NOTE — ED Notes (Signed)
Lab called to assist with obtaining BC's due to this RN previously starting PIV.

## 2023-01-06 NOTE — Addendum Note (Signed)
Addended by: [name] on: 01/06/2023 02:12 PM   Modules accepted: Level of Service

## 2023-01-06 NOTE — Assessment & Plan Note (Signed)
IV PPIs.  Aspiration precaution.

## 2023-01-06 NOTE — Assessment & Plan Note (Signed)
Patient has been on Coumadin in the past.  Patient declines Eliquis. Discussed with patient and advise and guide as needed.

## 2023-01-06 NOTE — Progress Notes (Addendum)
Ph: [phone] Fax: [phone]   Patient ID: KAL WASSMUTH, male    DOB: 1945/02/26, 77 y.o.   MRN: [id number]  This visit was conducted in person.  BP 120/76   Pulse 80   Temp 98.6 F (37 C) (Oral)   Ht 5\' 3"  (1.6 m)   Wt 237 lb (107.5 kg)   SpO2 100%   BMI 41.98 kg/m    CC: check wound  Subjective:   HPI: RUSS GASTER is a 77 y.o. male presenting on 01/06/2023 for Wound Check (Has noticed some clear discharge. )   See prior note for details.  Briefly, seen here 01/01/2023 with infected sacral wound after fall with prolonged period down.  Home health referral - awaiting phone call for later today  Wound care clinic - next appt 02/10/2023   No fevers/chills, nausea, vomiting, diarrhea.  Staying weak/fatigued.   Wound culture 01/01/2023: heavy growth MRSA resistant to bactrim sensitive to tetracycline.   Lab Results  Component Value Date   HGBA1C 7.3 (A) 01/01/2023        Relevant past medical, surgical, family and social history reviewed and updated as indicated. Interim medical history since our last visit reviewed. Allergies and medications reviewed and updated. Outpatient Medications Prior to Visit  Medication Sig Dispense Refill   albuterol (VENTOLIN HFA) 108 (90 Base) MCG/ACT inhaler Inhale 2 puffs into the lungs every 6 (six) hours as needed for wheezing or shortness of breath. 8 g 0   allopurinol (ZYLOPRIM) 300 MG tablet TAKE ONE TABLET BY MOUTH EVERY DAY 90 tablet 4   ALPRAZolam (XANAX) 1 MG tablet TAKE 1 TABLET BY MOUTH AT BEDTIME IF NEEDED FOR SLEEP. (Patient taking differently: Take 0.5 mg by mouth at bedtime.) 30 tablet 0   aspirin 81 MG EC tablet Take 81 mg by mouth daily. Swallow whole.     cephALEXin (KEFLEX) 500 MG capsule Take 1 capsule (500 mg total) by mouth 4 (four) times daily. 28 capsule 0   Cholecalciferol (VITAMIN D3) 25 MCG (1000 UT) CAPS Take 1 capsule (1,000 Units total) by mouth daily. 30 capsule    colchicine 0.6 MG  tablet TAKE 1 TABLET BY MOUTH DAILY AS NEEDED FOR GOUT GLARE. ON FIRST DAY OF GOUT FLARE,MAY TAKE 2 TABLETS AT ONCE AS DIRECTED 30 tablet 0   Continuous Glucose Sensor (FREESTYLE LIBRE 2 SENSOR) MISC 1 Device by Does not apply route every 14 (fourteen) days. 6 each 3   cyanocobalamin (VITAMIN B12) 1000 MCG/ML injection INJECT INTRAMUSCULARLY EVERY 30 DAYS 1 mL 6   cyclobenzaprine (FLEXERIL) 10 MG tablet Take 1 tablet (10 mg total) by mouth 3 (three) times daily as needed for muscle spasms. 30 tablet 0   doxazosin (CARDURA) 1 MG tablet TAKE ONE TABLET BY MOUTH TWICE DAILY 180 tablet 2   doxycycline (VIBRA-TABS) 100 MG tablet Take 1 tablet (100 mg total) by mouth 2 (two) times daily. 14 tablet 0   fluticasone (FLONASE) 50 MCG/ACT nasal spray Place 2 sprays into both nostrils daily. 16 g 1   furosemide (LASIX) 80 MG tablet TAKE 1 TABLET BY MOUTH EVERY DAY. MAY TAKE AN EXTRA PILL FOR SWELLING. 90 tablet 1   gabapentin (NEURONTIN) 600 MG tablet TAKE TWO TABLETS AT BEDTIME 180 tablet 3   glucose blood (GE100 BLOOD GLUCOSE TEST) test strip TEST FOUR TIMES DAILY 200 each 10   HYDROcodone-acetaminophen (NORCO) 10-325 MG tablet Take 1 tablet by mouth every 6 (six) hours as needed for moderate pain ((  typically once to twice daily if needed)).  30 tablet 0   isosorbide mononitrate (IMDUR) 30 MG 24 hr tablet TAKE ONE TABLET BY MOUTH DAILY 90 tablet 2   losartan (COZAAR) 25 MG tablet TAKE ONE TABLET BY MOUTH AT BEDTIME 90 tablet 1   lovastatin (MEVACOR) 40 MG tablet TAKE TWO TABLETS BY MOUTH AT BEDTIME 180 tablet 1   meclizine (ANTIVERT) 25 MG tablet Take 25 mg by mouth 3 (three) times daily as needed for dizziness.     Naphazoline-Pheniramine (OPCON-A OP) Place 1 drop into both eyes 3 (three) times daily as needed (itchy eyes.).      nitroGLYCERIN (NITROSTAT) 0.4 MG SL tablet Place 1 tablet (0.4 mg total) under the tongue every 5 (five) minutes as needed for chest pain. 25 tablet 3   NOVOLIN N 100 UNIT/ML  injection INJECT 160-170 UNITS TOTAL INTO THE SKIN AT BEDTIME 100 mL 0   NOVOLIN R 100 UNIT/ML injection INJECT 140 UNITS IN THE MORNING, 140 UNITS IN THE AFTERNOON, AND 160-180 UNITS IN THE EVENING USE BEFORE A MEAL 140 mL 1   ondansetron (ZOFRAN) 4 MG tablet Take 1 tablet (4 mg total) by mouth 3 (three) times daily as needed for nausea. 20 tablet 0   Potassium Citrate 15 MEQ (1620 MG) TBCR Take 1 tablet by mouth 3 (three) times daily. Takes 2 tablets daily     Semaglutide, 1 MG/DOSE, 4 MG/3ML SOPN Inject 1 mg as directed once a week. 3 mL 1   Sennosides-Docusate Sodium (STOOL SOFTENER & LAXATIVE PO) Take 2 tablets by mouth at bedtime.     silver sulfADIAZINE (SILVADENE) 1 % cream Apply topically 2 (two) times daily.     Syringe/Needle, Disp, (SYRINGE 3CC/22GX1-1/2") 22G X 1-1/2" 3 ML MISC Use to administer monthly b12 shots 50 each 0   No facility-administered medications prior to visit.     Per HPI unless specifically indicated in ROS section below Review of Systems  Objective:  BP 120/76   Pulse 80   Temp 98.6 F (37 C) (Oral)   Ht 5\' 3"  (1.6 m)   Wt 237 lb (107.5 kg)   SpO2 100%   BMI 41.98 kg/m   Wt Readings from Last 3 Encounters:  01/06/23 237 lb (107.5 kg)  01/03/23 238 lb (108 kg)  01/01/23 235 lb 2 oz (106.7 kg)      Physical Exam Vitals and nursing note reviewed.  Constitutional:      Appearance: Normal appearance. He is not ill-appearing.     Comments:  Ambulates with walker  Cardiovascular:     Rate and Rhythm: Normal rate and regular rhythm.     Pulses: Normal pulses.     Heart sounds: Murmur (3/6 systolic USB) heard.  Pulmonary:     Effort: Pulmonary effort is normal. No respiratory distress.     Breath sounds: Normal breath sounds. No wheezing, rhonchi or rales.  Skin:    General: Skin is warm and dry.     Findings: Erythema and wound present.          Comments: Open wound to sacral region of back with surrounding erythema and induration but no  fluctuance or eschar Superior wound diameter 4cm, inferior diameter 2cm, length of wound 5cm  No significant drainage present Granulation tissue present   Neurological:     Mental Status: He is alert.  Psychiatric:        Behavior: Behavior normal.        Sacral wound:  Results for orders placed or performed in visit on 01/01/23  POCT glycosylated hemoglobin (Hb A1C)   Collection Time: 01/01/23 12:25 PM  Result Value Ref Range   Hemoglobin A1C 7.3 (A) 4.0 - 5.6 %   HbA1c POC (<> result, manual entry)     HbA1c, POC (prediabetic range)     HbA1c, POC (controlled diabetic range)    WOUND CULTURE   Collection Time: 01/01/23  1:27 PM   Specimen: Wound  Result Value Ref Range   MICRO NUMBER: [id number]    SPECIMEN QUALITY: Adequate    SOURCE: WOUND (SITE NOT SPECIFIED)    STATUS: FINAL    GRAM STAIN:      No organisms or white blood cells seen No epithelial cells seen   ISOLATE 1: methicillin resistant Staphylococcus aureus (A)       Susceptibility   Methicillin resistant staphylococcus aureus - AEROBIC CULT, GRAM STAIN POSITIVE 1    VANCOMYCIN <=0.5 Sensitive     CIPROFLOXACIN >=8 Resistant     CLINDAMYCIN <=0.25 Sensitive     LEVOFLOXACIN 4 Intermediate     ERYTHROMYCIN <=0.25 Sensitive     GENTAMICIN <=0.5 Sensitive     OXACILLIN* NR Resistant      * Oxacillin-resistant staphylococci are resistant to all currently available beta-lactam antimicrobial agents with the possible exception of ceftaroline.     TETRACYCLINE <=1 Sensitive     TRIMETH/SULFA* >=320 Resistant      * Oxacillin-resistant staphylococci are resistant to all currently available beta-lactam antimicrobial agents with the possible exception of ceftaroline. Legend: S = Susceptible  I = Intermediate R = Resistant  NS = Not susceptible SDD = Susceptible Dose Dependent * = Not Tested  NR = Not Reported **NN = See Therapy Comments   Comprehensive metabolic panel   Collection Time: 01/01/23  1:27 PM   Result Value Ref Range   Sodium 133 (L) 135 - 145 mEq/L   Potassium 4.7 3.5 - 5.1 mEq/L   Chloride 92 (L) 96 - 112 mEq/L   CO2 30 19 - 32 mEq/L   Glucose, Bld 215 (H) 70 - 99 mg/dL   BUN 46 (H) 6 - 23 mg/dL   Creatinine, Ser 5.40 (H) 0.40 - 1.50 mg/dL   Total Bilirubin 1.8 (H) 0.2 - 1.2 mg/dL   Alkaline Phosphatase 106 39 - 117 U/L   AST 38 (H) 0 - 37 U/L   ALT 32 0 - 53 U/L   Total Protein 6.2 6.0 - 8.3 g/dL   Albumin 3.2 (L) 3.5 - 5.2 g/dL   GFR 98.11 (L) >91.47 mL/min   Calcium 8.8 8.4 - 10.5 mg/dL  CBC with Differential/Platelet   Collection Time: 01/01/23  1:27 PM  Result Value Ref Range   WBC 16.7 (H) 4.0 - 10.5 K/uL   RBC 3.99 (L) 4.22 - 5.81 Mil/uL   Hemoglobin 11.6 (L) 13.0 - 17.0 g/dL   HCT 82.9 (L) 56.2 - 13.0 %   MCV 89.7 78.0 - 100.0 fl   MCHC 32.6 30.0 - 36.0 g/dL   RDW 86.5 (H) 78.4 - 69.6 %   Platelets 302.0 150.0 - 400.0 K/uL   Neutrophils Relative % 87.8 (H) 43.0 - 77.0 %   Lymphocytes Relative 4.9 (L) 12.0 - 46.0 %   Monocytes Relative 5.3 3.0 - 12.0 %   Eosinophils Relative 1.2 0.0 - 5.0 %   Basophils Relative 0.8 0.0 - 3.0 %   Neutro Abs 14.6 (H) 1.4 - 7.7 K/uL   Lymphs  Abs 0.8 0.7 - 4.0 K/uL   Monocytes Absolute 0.9 0.1 - 1.0 K/uL   Eosinophils Absolute 0.2 0.0 - 0.7 K/uL   Basophils Absolute 0.1 0.0 - 0.1 K/uL   Echocardiogram 10/2020  Degenerative mitral valve with mild mitral valve regurgitation. No mitral stenosis. The aortic valve has an indeterminant number of cusps. There is mild  calcification of the aortic valve. There is moderate thickening of the aortic valve. Aortic valve regurgitation is trivial. Mild to moderate aortic valve sclerosis/calcification is present, without any evidence of aortic stenosis.   Assessment & Plan:   Problem List Items Addressed This Visit     Type 2 diabetes mellitus with diabetic polyneuropathy (HCC)   Type 2 diabetes mellitus with diabetic chronic kidney disease (HCC)   Infected wound - Primary   Ongoing  malaise, fatigue, gen weakness, slow improvement in infected sacral pressure sore.  Recommend ER evaluation for poorly healing MRSA infected sacral pressure sore despite 6 days of doxycycline, r/o carbuncle.  Pt will head to ER, I will notify [facility] ER charge nurse pt on his way.  No available wound clinic appointments until end of January.  Home health still pending. Wound culture grew MRSA, he does have h/o degenerative mitral valve regurgitation.      Protein-calorie malnutrition (HCC)   He has started drinking glucerna more regularly.         No orders of the defined types were placed in this encounter.   No orders of the defined types were placed in this encounter.   Patient Instructions  Go to [facility] ER today for further evaluation/treatment of poorly healing wound We will call them to let them know you're coming.   Follow up plan: No follow-ups on file.  [name], MD

## 2023-01-06 NOTE — Assessment & Plan Note (Signed)
Patient is currently on lovastatin which we will continue.

## 2023-01-06 NOTE — Assessment & Plan Note (Signed)
He has started drinking glucerna more regularly.

## 2023-01-06 NOTE — ED Notes (Signed)
Pt presents to ED with wound to coccyx area, pt states he started to notice a wound developing about 2 weeks ago, pt states issues with lower back and sleeps in recliner with heating pad due to back pain.  Pt endorses going to multiple PCP and derm apt to find resolution with none at this time.   Pt does have large erythremic area and white area noted to coccyx area.   Pt denies fevers or chills. Pt is A&Ox4.

## 2023-01-06 NOTE — Assessment & Plan Note (Addendum)
Ongoing malaise, fatigue, gen weakness, slow improvement in infected sacral pressure sore.  Recommend ER evaluation for poorly healing MRSA infected sacral pressure sore despite 6 days of doxycycline, r/o carbuncle.  Pt will head to ER, I will notify [facility] ER charge nurse pt on his way.  No available wound clinic appointments until end of January.  Home health still pending. Wound culture grew MRSA, he does have h/o degenerative mitral valve regurgitation.

## 2023-01-06 NOTE — Assessment & Plan Note (Signed)
Vitals:   01/06/23 1551  BP: (!) 143/52  Will currently be holding losartan and Lasix and continue patient on Imdur and as needed hydralazine as needed.

## 2023-01-06 NOTE — ED Provider Notes (Addendum)
[facility] Provider Note    Event Date/Time   First MD Initiated Contact with Patient 01/06/23 1553     (approximate)   History   Wound Infection   HPI  Donald [name] is a 77 y.o. male presents to the ER for evaluation of cellulitis infection failing outpatient management of his sacral area.  Has not had any I&D not found to have abscess.  Does have a history of diabetes.  Has been on antibiotics.  Did test positive for MRSA.     Physical Exam   Triage Vital Signs: ED Triage Vitals  Encounter Vitals Group     BP 01/06/23 1551 (!) 143/52     Systolic BP Percentile --      Diastolic BP Percentile --      Pulse Rate 01/06/23 1551 60     Resp 01/06/23 1551 20     Temp 01/06/23 1551 97.8 F (36.6 C)     Temp Source 01/06/23 1551 Oral     SpO2 01/06/23 1551 100 %     Weight --      Height --      Head Circumference --      Peak Flow --      Pain Score 01/06/23 1550 7     Pain Loc --      Pain Education --      Exclude from Growth Chart --     Most recent vital signs: Vitals:   01/06/23 1551  BP: (!) 143/52  Pulse: 60  Resp: 20  Temp: 97.8 F (36.6 C)  SpO2: 100%     Constitutional: Alert  Eyes: Conjunctivae are normal.  Head: Atraumatic. Nose: No congestion/rhinnorhea. Mouth/Throat: Mucous membranes are moist.   Neck: Painless ROM.  Cardiovascular:   Good peripheral circulation. Respiratory: Normal respiratory effort.  No retractions.  Gastrointestinal: Soft and nontender.  Musculoskeletal:  no deformity Neurologic:  MAE spontaneously. No gross focal neurologic deficits are appreciated.  Skin:  Skin is warm, dry.  Large area of cellulitis and purulence of the sacral area with some ulceration.  Area is indurated without fluctuance.  No crepitus. Psychiatric: Mood and affect are normal. Speech and behavior are normal.    ED Results / Procedures / Treatments   Labs (all labs ordered are listed, but only abnormal results  are displayed) Labs Reviewed  CBC WITH DIFFERENTIAL/PLATELET - Abnormal; Notable for the following components:      Result Value   RBC 4.08 (*)    Hemoglobin 12.1 (*)    HCT 36.3 (*)    All other components within normal limits  COMPREHENSIVE METABOLIC PANEL - Abnormal; Notable for the following components:   Sodium 131 (*)    Chloride 92 (*)    Glucose, Bld 225 (*)    BUN 63 (*)    Creatinine, Ser 1.96 (*)    Calcium 8.7 (*)    Total Protein 6.4 (*)    Albumin 3.0 (*)    GFR, Estimated 35 (*)    All other components within normal limits  LACTIC ACID, PLASMA - Abnormal; Notable for the following components:   Lactic Acid, Venous 2.9 (*)    All other components within normal limits  LACTIC ACID, PLASMA     EKG     RADIOLOGY Please see ED Course for my review and interpretation.  I personally reviewed all radiographic images ordered to evaluate for the above acute complaints and reviewed radiology reports and findings.  These findings were personally discussed with the patient.  Please see medical record for radiology report.    PROCEDURES:  Critical Care performed: yes  .Critical Care  Performed by: Willy Eddy, MD Authorized by: Willy Eddy, MD   Critical care provider statement:    Critical care time (minutes):  35   Critical care was necessary to treat or prevent imminent or life-threatening deterioration of the following conditions:  Sepsis   Critical care was time spent personally by me on the following activities:  Ordering and performing treatments and interventions, ordering and review of laboratory studies, ordering and review of radiographic studies, pulse oximetry, re-evaluation of patient's condition, review of old charts, obtaining history from patient or surrogate, examination of patient, evaluation of patient's response to treatment, discussions with primary provider, discussions with consultants and development of treatment plan with patient  or surrogate    MEDICATIONS ORDERED IN ED: Medications  ceFEPIme (MAXIPIME) 2 g in sodium chloride 0.9 % 100 mL IVPB (2 g Intravenous New Bag/Given 01/06/23 1800)  metroNIDAZOLE (FLAGYL) IVPB 500 mg (has no administration in time range)  vancomycin (VANCOCIN) IVPB 1000 mg/200 mL premix (has no administration in time range)  HYDROcodone-acetaminophen (NORCO/VICODIN) 5-325 MG per tablet 1 tablet (1 tablet Oral Given 01/06/23 1642)  iohexol (OMNIPAQUE) 350 MG/ML injection 75 mL (75 mLs Intravenous Contrast Given 01/06/23 1755)  sodium chloride 0.9 % bolus 500 mL (500 mLs Intravenous Bolus 01/06/23 1800)     IMPRESSION / MDM / ASSESSMENT AND PLAN / ED COURSE  I reviewed the triage vital signs and the nursing notes.                              Differential diagnosis includes, but is not limited to, abscess, cellulitis, neck Fash, osteomyelitis, pilonidal abscess  Patient presenting to the ER for evaluation of symptoms as described above.  Based on symptoms, risk factors and considered above differential, this presenting complaint could reflect a potentially life-threatening illness therefore the patient will be placed on continuous pulse oximetry and telemetry for monitoring.  Laboratory evaluation will be sent to evaluate for the above complaints.      Clinical Course as of 01/06/23 1802  Mon Jan 06, 2023  1742 Lactate is elevated.  Will order blood cultures as well as start IV broad-spectrum antibiotics.  CT imaging pending. [PR]  1801 CT imaging of my review and interpretation with significant cellulitis but I do not identify any clear abscess.  Will await formal radiology report.  Will consult hospitalist for admission. [PR]    Clinical Course User Index [PR] Willy Eddy, MD     FINAL CLINICAL IMPRESSION(S) / ED DIAGNOSES   Final diagnoses:  Cellulitis of buttock  MRSA (methicillin resistant staph aureus) culture positive     Rx / DC Orders   ED Discharge Orders      None        Note:  This document was prepared using Dragon voice recognition software and may include unintentional dictation errors.    Willy Eddy, MD 01/06/23 Flossie Buffy    Willy Eddy, MD 01/06/23 [phone]

## 2023-01-06 NOTE — Assessment & Plan Note (Signed)
Home regimen again consists of aspirin 81, Imdur. EKG today is pending patient is currently in sinus rhythm rate controlled. CHA2DS2/VAS Stroke Risk Points  Current as of 14 minutes ago     8 >= 2 Points: High Risk  1 to 1.99 Points: Medium Risk  0 Points: Low Risk    No Change      Details    This score determines the patient's risk of having a stroke if the  patient has atrial fibrillation.       Points Metrics  1 Has Congestive Heart Failure:  Yes    Current as of 14 minutes ago  1 Has Vascular Disease:  Yes    Current as of 14 minutes ago  1 Has Hypertension:  Yes    Current as of 14 minutes ago  2 Age:  77    Current as of 14 minutes ago  1 Has Diabetes Excluding Gestational Diabetes:  Yes    Current as of 14 minutes ago  2 Had Stroke:  No  Had TIA:  No  Had Thromboembolism:  Yes    Current as of 14 minutes ago  0 Male:  No    Current as of 14 minutes ago  Patient currently declines Eliquis per cardiology note.  Is in sinus rhythm.  Suspect patient has sleep apnea will also advise strongly to obtain sleep study and identify any underlying OSA which could also contribute to PAF.

## 2023-01-07 DIAGNOSIS — L03818 Cellulitis of other sites: Secondary | ICD-10-CM

## 2023-01-07 DIAGNOSIS — L02818 Cutaneous abscess of other sites: Secondary | ICD-10-CM

## 2023-01-07 DIAGNOSIS — L03317 Cellulitis of buttock: Principal | ICD-10-CM

## 2023-01-07 DIAGNOSIS — S31000D Unspecified open wound of lower back and pelvis without penetration into retroperitoneum, subsequent encounter: Secondary | ICD-10-CM | POA: Diagnosis not present

## 2023-01-07 LAB — COMPREHENSIVE METABOLIC PANEL
ALT: 23 U/L (ref 0–44)
AST: 21 U/L (ref 15–41)
Albumin: 2.4 g/dL — ABNORMAL LOW (ref 3.5–5.0)
Alkaline Phosphatase: 58 U/L (ref 38–126)
Anion gap: 10 (ref 5–15)
BUN: 61 mg/dL — ABNORMAL HIGH (ref 8–23)
CO2: 24 mmol/L (ref 22–32)
Calcium: 8.2 mg/dL — ABNORMAL LOW (ref 8.9–10.3)
Chloride: 96 mmol/L — ABNORMAL LOW (ref 98–111)
Creatinine, Ser: 1.78 mg/dL — ABNORMAL HIGH (ref 0.61–1.24)
GFR, Estimated: 39 mL/min — ABNORMAL LOW (ref >60)
Glucose, Bld: 196 mg/dL — ABNORMAL HIGH (ref 70–99)
Potassium: 5 mmol/L (ref 3.5–5.1)
Sodium: 130 mmol/L — ABNORMAL LOW (ref 135–145)
Total Bilirubin: 1.7 mg/dL — ABNORMAL HIGH (ref <1.2)
Total Protein: 5.2 g/dL — ABNORMAL LOW (ref 6.5–8.1)

## 2023-01-07 LAB — PROTIME-INR
INR: 1.3 — ABNORMAL HIGH (ref 0.8–1.2)
Prothrombin Time: 16.4 s — ABNORMAL HIGH (ref 11.4–15.2)

## 2023-01-07 LAB — CBC
HCT: 31.8 % — ABNORMAL LOW (ref 39.0–52.0)
Hemoglobin: 10.7 g/dL — ABNORMAL LOW (ref 13.0–17.0)
MCH: 29.3 pg (ref 26.0–34.0)
MCHC: 33.6 g/dL (ref 30.0–36.0)
MCV: 87.1 fL (ref 80.0–100.0)
Platelets: 287 10*3/uL (ref 150–400)
RBC: 3.65 MIL/uL — ABNORMAL LOW (ref 4.22–5.81)
RDW: 14.2 % (ref 11.5–15.5)
WBC: 8 10*3/uL (ref 4.0–10.5)
nRBC: 0 % (ref 0.0–0.2)

## 2023-01-07 LAB — PROCALCITONIN: Procalcitonin: 0.16 ng/mL

## 2023-01-07 LAB — CORTISOL-AM, BLOOD: Cortisol - AM: 9 ug/dL (ref 6.7–22.6)

## 2023-01-07 LAB — CBG MONITORING, ED
Glucose-Capillary: 216 mg/dL — ABNORMAL HIGH (ref 70–99)
Glucose-Capillary: 176 mg/dL — ABNORMAL HIGH (ref 70–99)
Glucose-Capillary: 200 mg/dL — ABNORMAL HIGH (ref 70–99)

## 2023-01-07 LAB — GLUCOSE, CAPILLARY: Glucose-Capillary: 203 mg/dL — ABNORMAL HIGH (ref 70–99)

## 2023-01-07 MED ORDER — VANCOMYCIN HCL 750 MG/150ML IV SOLN
750.0000 mg | INTRAVENOUS | Status: DC
Start: 2023-01-07 — End: 2023-01-10
  Administered 2023-01-07 – 2023-01-09 (×3): 750 mg via INTRAVENOUS
  Filled 2023-01-07 (×4): qty 150

## 2023-01-07 NOTE — Progress Notes (Addendum)
PROGRESS NOTE  Donald [name]  MWU:[id number] DOB: Apr 10, 1945 DOA: 01/06/2023 PCP: [name], MD   Brief Narrative:  Patient is a 77 year old male with history of diabetes type 2, diastolic CHF, hyperlipidemia, iron deficiency anemia who presented for the evaluation of sacral wound.  As per report, he recently fell ans also  gets steroid shots on his back for chronic back pain and was using a heating pad on his sacrum which developed into  wound.  He recently saw a dermatologist for this and was using silver sulfadiazine cream.  Wound cultures was obtained at PCP office and it was positive for MRSA and he was referred to the emergency department.  On presentation, he was  hemodynamically stable General Surgery consulted for possible need of debridement.  Currently on broad-spectrum antibiotic.  Plan for wound debridement tomorrow by general surgery  Assessment & Plan:  Principal Problem:   Sacral wound, subsequent encounter Active Problems:   Severe sepsis (HCC)   Dyslipidemia associated with type 2 diabetes mellitus (HCC)   Iron deficiency anemia   Essential hypertension   History of pulmonary embolus (PE)   GERD   OSA (obstructive sleep apnea)   CAD (coronary artery disease)   Heart failure with preserved ejection fraction (HCC)   Paroxysmal atrial fibrillation (HCC)   Acute kidney injury superimposed on stage 3b chronic kidney disease (HCC)   Sacral wound: Patient recently fell and was applying heating pad which she developed a wound  his sacrum.  Also takes steroids shots for chronic back pain.  Was seen dermatologist for this. Wound cultures was obtained at PCP office and it was positive for MRSA and he was referred to the emergency department.  Currently on vancomycin,cefpime.  General surgery consulted ,plan for  debridement tomorrow.  Follow-up blood cultures. CT pelvis showed soft tissue infiltration with possible developing collection in the subcutaneous fat over the  coccyx. No evidence of osteomyelitis. CT also showed bladder wall thickening but patient does not have any symptoms of dysuria or urinary retention.  Paroxysmal A-fib: Takes aspirin at home.  Currently in normal sinus rhythm  Chronic combined systolic/diastolic CHF: Currently appears euvolemic.  Follows with cardiology.  Last echo showed EF of 45 to 50%, global hypokinesis, grade 3 diastolic function  OSA: On CPAP  Coronary artery disease: No anginal symptoms.  Follows with cardiology.  GERD: Continue PPI  Diabetes type 2: Currently on sliding scale, Recent A1c of 7.3  History of PE: Diagnosed in 2008.  Currently not on anticoagulation.  CKD stage IIIb: Baseline creatinine around 1.8.  Currently kidney function at baseline  Obesity: BMI of  41        DVT prophylaxis:heparin injection 5,000 Units Start: 01/06/23 2200 SCDs Start: 01/06/23 1920     Code Status: Full Code  Family Communication: Wife at bedside on 12/24  Patient status:Inpatient  Patient is from :home  Anticipated discharge VO:ZDGU  Estimated DC date:after full work up   Consultants: Surgery  Procedures:None  Antimicrobials:  Anti-infectives (From admission, onward)    Start     Dose/Rate Route Frequency Ordered Stop   01/07/23 0600  ceFEPIme (MAXIPIME) 2 g in sodium chloride 0.9 % 100 mL IVPB        2 g 200 mL/hr over 30 Minutes Intravenous Every 12 hours 01/06/23 1942     01/06/23 2200  vancomycin (VANCOCIN) IVPB 1000 mg/200 mL premix       Placed in "And" Linked Group   1,000 mg 200 mL/hr over  60 Minutes Intravenous Every 24 hours 01/06/23 1937     01/06/23 2030  vancomycin (VANCOREADY) IVPB 1250 mg/250 mL       Placed in "And" Linked Group   1,250 mg 166.7 mL/hr over 90 Minutes Intravenous Every 24 hours 01/06/23 1937     01/06/23 1942  vancomycin variable dose per unstable renal function (pharmacist dosing)         Does not apply See admin instructions 01/06/23 1942     01/06/23 1745   ceFEPIme (MAXIPIME) 2 g in sodium chloride 0.9 % 100 mL IVPB        2 g 200 mL/hr over 30 Minutes Intravenous  Once 01/06/23 1741 01/06/23 1839   01/06/23 1745  metroNIDAZOLE (FLAGYL) IVPB 500 mg        500 mg 100 mL/hr over 60 Minutes Intravenous  Once 01/06/23 1741 01/06/23 2103   01/06/23 1745  vancomycin (VANCOCIN) IVPB 1000 mg/200 mL premix  Status:  Discontinued        1,000 mg 200 mL/hr over 60 Minutes Intravenous  Once 01/06/23 1741 01/06/23 1937       Subjective:  Patient seen and examined at bedside today.  Hemodynamically stable.  Overall comfortable.  Lying on bed. Denies significant back pain or pain on the wound.  Wife at bedside.  Objective: Vitals:   01/07/23 0115 01/07/23 0325 01/07/23 0639 01/07/23 0645  BP:  (!) 124/39 (!) 120/41   Pulse: (!) 55 60 69   Resp: (!) 23 18 (!) 26   Temp:    98.6 F (37 C)  TempSrc:    Oral  SpO2: 99% 96% 100%     Intake/Output Summary (Last 24 hours) at 01/07/2023 0843 Last data filed at 01/07/2023 0145 Gross per 24 hour  Intake 100 ml  Output 400 ml  Net -300 ml   There were no vitals filed for this visit.  Examination:  General exam: Overall comfortable, not in distress, morbidly obese HEENT: PERRL, cpap mask Respiratory system:  no wheezes or crackles  Cardiovascular system: S1 & S2 heard, RRR.  Gastrointestinal system: Abdomen is nondistended, soft and nontender. Central nervous system: Alert and oriented Extremities: No edema, no clubbing ,no cyanosis, psych around Skin: Sacral ulcer, no rash or icterus    Data Reviewed: I have personally reviewed following labs and imaging studies  CBC: Recent Labs  Lab 01/01/23 1327 01/06/23 1612 01/07/23 0635  WBC 16.7* 9.0 8.0  NEUTROABS 14.6* 7.4  --   HGB 11.6* 12.1* 10.7*  HCT 35.8* 36.3* 31.8*  MCV 89.7 89.0 87.1  PLT 302.0 330 287   Basic Metabolic Panel: Recent Labs  Lab 01/01/23 1327 01/06/23 1612 01/07/23 0635  NA 133* 131* 130*  K 4.7 4.8 5.0   CL 92* 92* 96*  CO2 30 25 24   GLUCOSE 215* 225* 196*  BUN 46* 63* 61*  CREATININE 1.81* 1.96* 1.78*  CALCIUM 8.8 8.7* 8.2*     Recent Results (from the past 240 hours)  WOUND CULTURE     Status: Abnormal   Collection Time: 01/01/23  1:27 PM   Specimen: Wound  Result Value Ref Range Status   MICRO NUMBER: [id number]  Final   SPECIMEN QUALITY: Adequate  Final   SOURCE: WOUND (SITE NOT SPECIFIED)  Final   STATUS: FINAL  Final   GRAM STAIN:   Final    No organisms or white blood cells seen No epithelial cells seen   ISOLATE 1: methicillin resistant Staphylococcus aureus (A)  Final    Comment: Heavy growth of Methicillin resistant Staphylococcus aureus (MRSA)      Susceptibility   Methicillin resistant staphylococcus aureus - AEROBIC CULT, GRAM STAIN POSITIVE 1    VANCOMYCIN <=0.5 Sensitive     CIPROFLOXACIN >=8 Resistant     CLINDAMYCIN <=0.25 Sensitive     LEVOFLOXACIN 4 Intermediate     ERYTHROMYCIN <=0.25 Sensitive     GENTAMICIN <=0.5 Sensitive     OXACILLIN* NR Resistant      * Oxacillin-resistant staphylococci are resistant to all currently available beta-lactam antimicrobial agents with the possible exception of ceftaroline.     TETRACYCLINE <=1 Sensitive     TRIMETH/SULFA* >=320 Resistant      * Oxacillin-resistant staphylococci are resistant to all currently available beta-lactam antimicrobial agents with the possible exception of ceftaroline. Legend: S = Susceptible  I = Intermediate R = Resistant  NS = Not susceptible SDD = Susceptible Dose Dependent * = Not Tested  NR = Not Reported **NN = See Therapy Comments   Blood culture (routine x 2)     Status: None (Preliminary result)   Collection Time: 01/06/23  7:28 PM   Specimen: BLOOD  Result Value Ref Range Status   Specimen Description BLOOD LEFT ANTECUBITAL  Final   Special Requests   Final    BOTTLES DRAWN AEROBIC AND ANAEROBIC Blood Culture adequate volume   Culture   Final    NO GROWTH < 12  HOURS Performed at [facility], [address] [place], [place] [address]    Report Status PENDING  Incomplete  Blood culture (routine x 2)     Status: None (Preliminary result)   Collection Time: 01/06/23  7:29 PM   Specimen: BLOOD  Result Value Ref Range Status   Specimen Description BLOOD BLOOD LEFT HAND  Final   Special Requests   Final    BOTTLES DRAWN AEROBIC AND ANAEROBIC Blood Culture adequate volume   Culture   Final    NO GROWTH < 12 HOURS Performed at [facility], [address], [place], [place] [address]    Report Status PENDING  Incomplete     Radiology Studies: CT PELVIS W CONTRAST Result Date: 01/06/2023 CLINICAL DATA:  Soft tissue infection suspected. Wound in the coccygeal area developing about 2 weeks ago. EXAM: CT PELVIS WITH CONTRAST TECHNIQUE: Multidetector CT imaging of the pelvis was performed using the standard protocol following the bolus administration of intravenous contrast. RADIATION DOSE REDUCTION: This exam was performed according to the departmental dose-optimization program which includes automated exposure control, adjustment of the mA and/or kV according to patient size and/or use of iterative reconstruction technique. CONTRAST:  75mL OMNIPAQUE IOHEXOL 350 MG/ML SOLN COMPARISON:  03/07/2020 FINDINGS: Urinary Tract: Diffuse bladder wall thickening may indicate cystitis or outlet obstruction. Correlate with urinalysis. No stones or filling defects. Bowel: Visualized portions of large and small bowel are not abnormally distended. Sigmoid colonic diverticulosis without evidence of acute diverticulitis. Vascular/Lymphatic: Aortoiliac calcification.  No aneurysm. Reproductive:  Prostate gland is not enlarged. Other: Diffuse soft tissue infiltration with some calcification in the anterior abdominal wall fat. This is similar to prior study and likely represents postoperative change or chronic scarring. Soft tissue infiltration in the  subcutaneous fat dorsal to the coccyx with poorly defined increased density possibly representing developing collection measuring 2.6 x 5.6 cm. This may represent cellulitis or early abscess. Underlying bone appears intact. No bone erosion to suggest osteomyelitis. Musculoskeletal: Degenerative changes in the lower lumbar spine. Posterior  laminectomy with posterior fixation and intervertebral fusion at L4-5. Degenerative changes in the hips. IMPRESSION: 1. Soft tissue infiltration with possible developing collection in the subcutaneous fat over the coccyx. No evidence of osteomyelitis. 2. Bladder wall thickening may indicate cystitis or outlet obstruction. Correlate with urinalysis. 3. Aortic atherosclerosis. Electronically Signed   By: Burman Nieves M.D.   On: 01/06/2023 19:09    Scheduled Meds:  allopurinol  100 mg Oral Daily   aspirin EC  81 mg Oral Daily   fluticasone  2 spray Each Nare Daily   gabapentin  1,200 mg Oral QHS   heparin  5,000 Units Subcutaneous Q8H   insulin aspart  0-20 Units Subcutaneous TID WC   isosorbide mononitrate  30 mg Oral Daily   pantoprazole (PROTONIX) IV  40 mg Intravenous Q12H   pravastatin  40 mg Oral q1800   vancomycin variable dose per unstable renal function (pharmacist dosing)   Does not apply See admin instructions   Continuous Infusions:  ceFEPime (MAXIPIME) IV Stopped (01/07/23 8119)   vancomycin Stopped (01/07/23 0017)   And   vancomycin Stopped (01/07/23 0122)     LOS: 1 day   Burnadette Pop, MD [facility] P12/24/2024, 8:43 AM
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
## 2023-01-07 NOTE — Evaluation (Signed)
Physical Therapy Evaluation Patient Details Name: Donald [name] MRN: [id number] DOB: [birth year] Today's Date: 01/07/2023  History of Present Illness  presented to ER and admitted secondary to generalized weakness, increased pain related to sacral wound  Clinical Impression  Patient resting in bed upon arrival to room; supportive wife at bedside.  Patient alert and oriented, follows commands and agreeable to participation with treatment session.  Endorses back/sacral pain, 7/10; meds requested per RN during session.  Eager for OOB and position change to optimize pain management. Bilat UE/LE strength and ROM grossly symemtrical and WFL for basic transfers and mobility; no focal weakness appreciated.  ABle to complete bed mobility with min assist; sit/stand, standing balance and basic transfer (bed/chair) with RW, min assist.  Demonstrates short, choppy steps but no overt buckling or LOB.  Additional distance/activity deferred due to pain and limited line length.  Will continue to assess/progress as appropriate in subsequent sessions. Would benefit from skilled PT to address above deficits and promote optimal return to PLOF.; recommend post-acute PT follow up as indicated by interdisciplinary care team.            If plan is discharge home, recommend the following: A little help with walking and/or transfers;A little help with bathing/dressing/bathroom   Can travel by private vehicle        Equipment Recommendations Rolling walker (2 wheels)  Recommendations for Other Services       Functional Status Assessment Patient has had a recent decline in their functional status and demonstrates the ability to make significant improvements in function in a reasonable and predictable amount of time.     Precautions / Restrictions Precautions Precautions: Fall Restrictions Weight Bearing Restrictions Per Provider Order: No      Mobility  Bed Mobility Overal bed mobility: Needs  Assistance Bed Mobility: Supine to Sit     Supine to sit: Min assist          Transfers Overall transfer level: Needs assistance Equipment used: Rolling walker (2 wheels) Transfers: Sit to/from Stand, Bed to chair/wheelchair/BSC Sit to Stand: Min assist Stand pivot transfers: Min assist         General transfer comment: cuing for hand placement    Ambulation/Gait               General Gait Details: deferred due to lines (in ED); portable IV not available, limited by active IV  Stairs            Wheelchair Mobility     Tilt Bed    Modified Rankin (Stroke Patients Only)       Balance Overall balance assessment: Needs assistance Sitting-balance support: No upper extremity supported, Feet supported Sitting balance-Leahy Scale: Good     Standing balance support: Bilateral upper extremity supported Standing balance-Leahy Scale: Fair                               Pertinent Vitals/Pain Pain Assessment Pain Assessment: Faces Pain Score: 7  Faces Pain Scale: Hurts even more Pain Location: back/sacrum Pain Descriptors / Indicators: Grimacing Pain Intervention(s): Limited activity within patient's tolerance, Monitored during session, Repositioned, Patient requesting pain meds-RN notified    Home Living Family/patient expects to be discharged to:: Private residence Living Arrangements: Spouse/significant other Available Help at Discharge: Family Type of Home: House Home Access: Stairs to enter Entrance Stairs-Rails: None Entrance Stairs-Number of Steps: 1   Home Layout: One level Home Equipment: [employer] -  single point      Prior Function               Mobility Comments: Mod indep with ADLs, household and limited community mobilization; intermittent use of SPC.  Does endorse at least 3 falls in recent weeks/months (fall out of bed x3)       Extremity/Trunk Assessment   Upper Extremity Assessment Upper Extremity Assessment:  Overall WFL for tasks assessed    Lower Extremity Assessment Lower Extremity Assessment: Overall WFL for tasks assessed (grossly at least 4/5 throughout)       Communication      Cognition Arousal: Alert Behavior During Therapy: [facility] for tasks assessed/performed Overall Cognitive Status: Within Functional Limits for tasks assessed                                          General Comments      Exercises     Assessment/Plan    PT Assessment Patient needs continued PT services  PT Problem List Decreased strength;Decreased range of motion;Decreased activity tolerance;Decreased balance;Decreased mobility;Decreased coordination;Decreased cognition;Decreased knowledge of use of DME;Decreased safety awareness;Decreased knowledge of precautions;Cardiopulmonary status limiting activity       PT Treatment Interventions DME instruction;Gait training;Functional mobility training;Therapeutic activities;Therapeutic exercise;Balance training;Neuromuscular re-education;Stair training;Patient/family education    PT Goals (Current goals can be found in the Care Plan section)  Acute Rehab PT Goals Patient Stated Goal: to make the pain better PT Goal Formulation: With patient/family Time For Goal Achievement: 01/21/23 Potential to Achieve Goals: Good    Frequency Min 1X/week     Co-evaluation               AM-PAC PT "6 Clicks" Mobility  Outcome Measure Help needed turning from your back to your side while in a flat bed without using bedrails?: None Help needed moving from lying on your back to sitting on the side of a flat bed without using bedrails?: A Little Help needed moving to and from a bed to a chair (including a wheelchair)?: A Little Help needed standing up from a chair using your arms (e.g., wheelchair or bedside chair)?: A Little Help needed to walk in hospital room?: A Little Help needed climbing 3-5 steps with a railing? : A Little 6 Click Score:  19    End of Session   Activity Tolerance: Patient limited by pain Patient left: in chair;with call bell/phone within reach;with family/visitor present Nurse Communication: Mobility status PT Visit Diagnosis: Muscle weakness (generalized) (M62.81);Difficulty in walking, not elsewhere classified (R26.2)    Time: 1191-4782 PT Time Calculation (min) (ACUTE ONLY): 20 min   Charges:   PT Evaluation $PT Eval Moderate Complexity: 1 Mod   PT General Charges $$ ACUTE PT VISIT: 1 Visit         Larence Thone H. Manson Passey, PT, DPT, NCS 01/07/23, 10:51 AM [phone]
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
## 2023-01-07 NOTE — Progress Notes (Signed)
Patient arrived on unit from ED. Alert and oriented. No distress noted, no complaints voiced. Will settle and assess patient and monitor according to plan of care and orders. MEWS currently green.

## 2023-01-07 NOTE — ED Notes (Signed)
As patient being packed up to go to unit, pt noticed personal walker missing. Pt informed note would be made and instructed to inquire with PT in morning.

## 2023-01-07 NOTE — ED Notes (Signed)
Confirmed with CCMD pt is on continuous cardiac monitoring.

## 2023-01-08 ENCOUNTER — Inpatient Hospital Stay: Payer: Medicare Other | Admitting: Anesthesiology

## 2023-01-08 ENCOUNTER — Encounter
Admission: EM | Disposition: A | Discharge status: Home Health | Payer: Self-pay | Source: Home / Self Care | Attending: Internal Medicine

## 2023-01-08 ENCOUNTER — Other Ambulatory Visit: Payer: Self-pay

## 2023-01-08 DIAGNOSIS — N179 Acute kidney failure, unspecified: Secondary | ICD-10-CM | POA: Diagnosis not present

## 2023-01-08 DIAGNOSIS — I480 Paroxysmal atrial fibrillation: Secondary | ICD-10-CM

## 2023-01-08 DIAGNOSIS — S31000D Unspecified open wound of lower back and pelvis without penetration into retroperitoneum, subsequent encounter: Secondary | ICD-10-CM | POA: Diagnosis not present

## 2023-01-08 DIAGNOSIS — N1832 Chronic kidney disease, stage 3b: Secondary | ICD-10-CM

## 2023-01-08 DIAGNOSIS — E871 Hypo-osmolality and hyponatremia: Secondary | ICD-10-CM | POA: Insufficient documentation

## 2023-01-08 DIAGNOSIS — L02818 Cutaneous abscess of other sites: Secondary | ICD-10-CM | POA: Diagnosis not present

## 2023-01-08 HISTORY — PX: INCISION AND DRAINAGE PERIRECTAL ABSCESS: SHX1804

## 2023-01-08 LAB — GLUCOSE, CAPILLARY
Glucose-Capillary: 149 mg/dL — ABNORMAL HIGH (ref 70–99)
Glucose-Capillary: 206 mg/dL — ABNORMAL HIGH (ref 70–99)
Glucose-Capillary: 337 mg/dL — ABNORMAL HIGH (ref 70–99)
Glucose-Capillary: 277 mg/dL — ABNORMAL HIGH (ref 70–99)

## 2023-01-08 LAB — BASIC METABOLIC PANEL
Anion gap: 7 (ref 5–15)
BUN: 55 mg/dL — ABNORMAL HIGH (ref 8–23)
CO2: 29 mmol/L (ref 22–32)
Calcium: 8.6 mg/dL — ABNORMAL LOW (ref 8.9–10.3)
Chloride: 101 mmol/L (ref 98–111)
Creatinine, Ser: 1.88 mg/dL — ABNORMAL HIGH (ref 0.61–1.24)
GFR, Estimated: 37 mL/min — ABNORMAL LOW (ref >60)
Glucose, Bld: 172 mg/dL — ABNORMAL HIGH (ref 70–99)
Potassium: 4.6 mmol/L (ref 3.5–5.1)
Sodium: 137 mmol/L (ref 135–145)

## 2023-01-08 SURGERY — INCISION AND DRAINAGE, ABSCESS, PERIRECTAL
Anesthesia: General

## 2023-01-08 MED ORDER — ROCURONIUM BROMIDE 100 MG/10ML IV SOLN
INTRAVENOUS | Status: DC | PRN
  Administered 2023-01-08: 50 mg via INTRAVENOUS

## 2023-01-08 MED ORDER — SILVER SULFADIAZINE 1 % EX CREA
TOPICAL_CREAM | Freq: Two times a day (BID) | CUTANEOUS | Status: DC
Start: 2023-01-08 — End: 2023-01-10
  Filled 2023-01-08: qty 85

## 2023-01-08 MED ORDER — ONDANSETRON HCL 4 MG/2ML IJ SOLN: 4.0000 mg | Freq: Once | INTRAMUSCULAR | Status: DC | PRN

## 2023-01-08 MED ORDER — ACETAMINOPHEN 10 MG/ML IV SOLN
1000.0000 mg | Freq: Once | INTRAVENOUS | Status: DC | PRN
Start: 2023-01-08 — End: 2023-01-08
  Administered 2023-01-08: 1000 mg via INTRAVENOUS

## 2023-01-08 MED ORDER — ONDANSETRON HCL 4 MG/2ML IJ SOLN
INTRAMUSCULAR | Status: DC | PRN
  Administered 2023-01-08: 4 mg via INTRAVENOUS

## 2023-01-08 MED ORDER — PROPOFOL 10 MG/ML IV BOLUS
INTRAVENOUS | Status: AC
Start: 2023-01-08 — End: ?
  Filled 2023-01-08: qty 20

## 2023-01-08 MED ORDER — 0.9 % SODIUM CHLORIDE (POUR BTL) OPTIME
TOPICAL | Status: DC | PRN
  Administered 2023-01-08: 500 mL

## 2023-01-08 MED ORDER — FENTANYL CITRATE (PF) 100 MCG/2ML IJ SOLN: 25.0000 ug | INTRAMUSCULAR | Status: DC | PRN

## 2023-01-08 MED ORDER — SUGAMMADEX SODIUM 200 MG/2ML IV SOLN
INTRAVENOUS | Status: DC | PRN
  Administered 2023-01-08: 400 mg via INTRAVENOUS

## 2023-01-08 MED ORDER — LIDOCAINE HCL (CARDIAC) PF 100 MG/5ML IV SOSY
PREFILLED_SYRINGE | INTRAVENOUS | Status: DC | PRN
  Administered 2023-01-08: 100 mg via INTRAVENOUS

## 2023-01-08 MED ORDER — SODIUM CHLORIDE 0.9 % IV SOLN: INTRAVENOUS | Status: DC | PRN

## 2023-01-08 MED ORDER — PROPOFOL 10 MG/ML IV BOLUS
INTRAVENOUS | Status: DC | PRN
  Administered 2023-01-08: 100 mg via INTRAVENOUS

## 2023-01-08 MED ORDER — DEXAMETHASONE SODIUM PHOSPHATE 10 MG/ML IJ SOLN
INTRAMUSCULAR | Status: DC | PRN
  Administered 2023-01-08: 5 mg via INTRAVENOUS

## 2023-01-08 MED ORDER — OXYCODONE HCL 5 MG PO TABS: 5.0000 mg | ORAL_TABLET | Freq: Once | ORAL | Status: DC | PRN

## 2023-01-08 MED ORDER — FENTANYL CITRATE (PF) 100 MCG/2ML IJ SOLN
INTRAMUSCULAR | Status: DC | PRN
  Administered 2023-01-08: 100 ug via INTRAVENOUS

## 2023-01-08 MED ORDER — OXYCODONE HCL 5 MG/5ML PO SOLN: 5.0000 mg | Freq: Once | ORAL | Status: DC | PRN

## 2023-01-08 MED ORDER — CEFAZOLIN SODIUM-DEXTROSE 2-3 GM-%(50ML) IV SOLR
INTRAVENOUS | Status: DC | PRN
  Administered 2023-01-08: 2 g via INTRAVENOUS

## 2023-01-08 MED ORDER — ACETAMINOPHEN 10 MG/ML IV SOLN
INTRAVENOUS | Status: AC
  Filled 2023-01-08: qty 100

## 2023-01-08 MED ORDER — FENTANYL CITRATE (PF) 100 MCG/2ML IJ SOLN
INTRAMUSCULAR | Status: AC
  Filled 2023-01-08: qty 2

## 2023-01-08 SURGICAL SUPPLY — 28 items
BLADE CLIPPER SURG (BLADE) IMPLANT
BNDG GAUZE DERMACEA FLUFF 4 (GAUZE/BANDAGES/DRESSINGS) IMPLANT
DRAPE LAPAROTOMY 100X77 ABD (DRAPES) ×1 IMPLANT
DRAPE LEGGINS SURG 28X43 STRL (DRAPES) ×1 IMPLANT
DRAPE UNDER BUTTOCK W/FLU (DRAPES) ×1 IMPLANT
ELECT CAUTERY BLADE 6.4 (BLADE) ×1 IMPLANT
ELECT REM PT RETURN 9FT ADLT (ELECTROSURGICAL) ×1
ELECTRODE REM PT RTRN 9FT ADLT (ELECTROSURGICAL) ×1 IMPLANT
GAUZE 4X4 16PLY ~~LOC~~+RFID DBL (SPONGE) IMPLANT
GAUZE PACKING FOLDED 2 STR (GAUZE/BANDAGES/DRESSINGS) IMPLANT
GLOVE BIOGEL PI IND STRL 7.5 (GLOVE) ×1 IMPLANT
GLOVE SURG SYN 7.0 (GLOVE) ×1 IMPLANT
GLOVE SURG SYN 7.0 PF PI (GLOVE) ×1 IMPLANT
GOWN STRL REUS W/ TWL LRG LVL3 (GOWN DISPOSABLE) ×2 IMPLANT
MANIFOLD NEPTUNE II (INSTRUMENTS) ×1 IMPLANT
NDL HYPO 22X1.5 SAFETY MO (MISCELLANEOUS) IMPLANT
NEEDLE HYPO 22X1.5 SAFETY MO (MISCELLANEOUS) IMPLANT
NS IRRIG 1000ML POUR BTL (IV SOLUTION) ×1 IMPLANT
PACK BASIN MINOR ARMC (MISCELLANEOUS) ×1 IMPLANT
PAD ABD DERMACEA PRESS 5X9 (GAUZE/BANDAGES/DRESSINGS) IMPLANT
PAD PREP OB/GYN DISP 24X41 (PERSONAL CARE ITEMS) ×1 IMPLANT
SOL PREP PVP 2OZ (MISCELLANEOUS) ×1
SOLUTION PREP PVP 2OZ (MISCELLANEOUS) ×1 IMPLANT
SPONGE T-LAP 18X18 ~~LOC~~+RFID (SPONGE) ×1 IMPLANT
SURGILUBE 2OZ TUBE FLIPTOP (MISCELLANEOUS) ×1 IMPLANT
SWAB CULTURE AMIES ANAERIB BLU (MISCELLANEOUS) IMPLANT
TRAP FLUID SMOKE EVACUATOR (MISCELLANEOUS) ×1 IMPLANT
WATER STERILE IRR 500ML POUR (IV SOLUTION) ×1 IMPLANT
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
## 2023-01-08 NOTE — Anesthesia Preprocedure Evaluation (Signed)
Anesthesia Evaluation  Patient identified by MRN, date of birth, ID band Patient awake    Reviewed: Allergy & Precautions, NPO status , Patient's Chart, lab work & pertinent test results  History of Anesthesia Complications Negative for: history of anesthetic complications  Airway Mallampati: III  TM Distance: >3 FB Neck ROM: full    Dental no notable dental hx. (+) Teeth Intact   Pulmonary shortness of breath, asthma , sleep apnea and Continuous Positive Airway Pressure Ventilation , neg COPD, Patient abstained from smoking.Not current smoker   Pulmonary exam normal breath sounds clear to auscultation       Cardiovascular Exercise Tolerance: Poor METShypertension, Pt. on medications pulmonary hypertension+ CAD (Nonobstructive CAD ), + Peripheral Vascular Disease and +CHF (HFpEF)  (-) Past MI + dysrhythmias (Paroxysmal atrial fibrillation ) Atrial Fibrillation + Valvular Problems/Murmurs MR and AS  Rhythm:Regular Rate:Normal + Systolic murmurs Prior hospitalization October 2022 for, Syncope , found on his garage floor Low potassium 2.9 , low sugar also noted to be in atrial fibrillation  Cardiac catheterization in October 2022 1. Mild, non-obstructive coronary artery disease, as detailed below. No significant stenosis identified to explain syncope or cardiomyopathy. 2. Normal left ventricular filling pressure. 3. Mild-moderate aortic valve stenosis based on peak-to-peak gradient (~20 mmHg).  Echo showed EF of 45 to 50% with global hypokinesis, grade III diastolic dysfunction.   Carotid ultrasound less than 50% stenosis bilaterally.  MRI with no acute intracranial findings   Lower extremity edema   Neuro/Psych  PSYCHIATRIC DISORDERS Anxiety Depression    Neuropathy negative neurological ROS     GI/Hepatic negative GI ROS, Neg liver ROS,neg GERD  ,,  Endo/Other  diabetes, Poorly Controlled, Type 2, Insulin Dependent   Class 3 obesityHas not taken GLP1 agonist in 2 weeks. Denies N/V  Renal/GU CRFRenal disease     Musculoskeletal   Abdominal  (+) + obese  Peds  Hematology  (+) Blood dyscrasia, anemia   Anesthesia Other Findings Past Medical History: No date: (HFpEF) heart failure with preserved ejection fraction (HCC)     Comment:  a. 09/2018 Echo: EF 60-65%. PASP . Mild-mod LAE.               Mild MR/TR. 07/26/2006: ANEMIA-IRON DEFICIENCY No date: Anxiety 07/26/2006: ASTHMA No date: Asthma No date: Back pain, chronic No date: Bell's palsy No date: CKD (chronic kidney disease), stage III (HCC) 07/26/2006: COLONIC POLYPS, HX OF 01/2020: COVID-19 virus infection 03/14/2009: DEPRESSION 07/26/2006: DIABETES MELLITUS, TYPE II 10/05/2007: DISC DISEASE, LUMBAR 12/03/2007: DVT 04/13/2009: DYSLIPIDEMIA No date: Dyspnea     Comment:  when gets up and walks around and back is hurting really              bad -only Shortness of breath  then 07/26/2006: GERD No date: Gout No date: Heart murmur No date: History of kidney stones 07/26/2006: HYPERTENSION 08/21/2007: INSOMNIA No date: Neuropathy No date: Nonobstructive CAD (coronary artery disease)     Comment:  a. 2012 Cath: no high grade stenosis; b. 2018 MV: No               ischemia. Attenuation artifact.  12/03/2007: OBSTRUCTIVE SLEEP APNEA     Comment:  Use C-PAP 07/26/2006: PERIPHERAL NEUROPATHY 11/20/2006: Pernicious anemia 10/05/2007: PULMONARY EMBOLISM  Past Surgical History: 1968: APPENDECTOMY 1977, 06/14/2008: BACK SURGERY     Comment:  Dr. Dutch Quint at [facility] (06/10) No date: CARDIAC CATHETERIZATION 10/2017: CARPAL TUNNEL RELEASE; Right 05/30/2014: CATARACT EXTRACTION W/PHACO; Left     Comment:  Procedure: CATARACT EXTRACTION PHACO AND INTRAOCULAR               LENS PLACEMENT (IOC);  Surgeon: Sallee Lange, MD;                Location: [facility] ORS;  Service: Ophthalmology;  Laterality:              Left;  [name] 01:20 AP% 23.7 CDE  31.86 10/2002: COLONOSCOPY     Comment:  HP, SSA, TA, rpt 3 yrs ([place]) 01/2005: COLONOSCOPY     Comment:  diverticulosis rpt 5 yrs ([facility])  10/19/2018: IR NEPHROSTOMY PLACEMENT LEFT 10/19/2018: IR URETERAL STENT LEFT NEW ACCESS W/O SEP NEPHROSTOMY CATH 11/01/2020: LEFT HEART CATH AND CORONARY ANGIOGRAPHY; N/A     Comment:  Procedure: LEFT HEART CATH AND CORONARY ANGIOGRAPHY;                Surgeon: Yvonne Kendall, MD;  Location: [facility] INVASIVE               CV LAB;  Service: Cardiovascular;  Laterality: N/A; No date: LITHOTRIPSY     Comment:  X 2 10/19/2018: NEPHROLITHOTOMY; Left     Comment:  Procedure: NEPHROLITHOTOMY PERCUTANEOUS;  Surgeon:               Marcine Matar, MD;  Location: [facility] ORS;  Service:               Urology;  Laterality: Left;  3 HRS 04/18/2016: PARATHYROIDECTOMY; Right     Comment:  PARATHYROIDECTOMY for cyst Bud Face, MD) No date: SHOULDER ARTHROSCOPY W/ ROTATOR CUFF REPAIR; Right No date: TONSILLECTOMY     Reproductive/Obstetrics negative OB ROS                             Anesthesia Physical Anesthesia Plan  ASA: 3  Anesthesia Plan: General   Post-op Pain Management: Ofirmev IV (intra-op)*   Induction: Intravenous  PONV Risk Score and Plan: 2 and Ondansetron, Dexamethasone and Treatment may vary due to age or medical condition  Airway Management Planned: Oral ETT and Video Laryngoscope Planned  Additional Equipment: None  Intra-op Plan:   Post-operative Plan: Extubation in OR  Informed Consent: I have reviewed the patients History and Physical, chart, labs and discussed the procedure including the risks, benefits and alternatives for the proposed anesthesia with the patient or authorized representative who has indicated his/her understanding and acceptance.     Dental advisory given  Plan Discussed with: CRNA and Surgeon  Anesthesia Plan Comments: (Discussed risks of anesthesia with patient, including  PONV, sore throat, lip/dental/eye damage. Rare risks discussed as well, such as cardiorespiratory and neurological sequelae, and allergic reactions. Discussed the role of CRNA in patient's perioperative care. Patient understands. Patient informed about increased incidence of above perioperative risk due to high BMI. Patient understands.  )        Anesthesia Quick Evaluation

## 2023-01-08 NOTE — Anesthesia Procedure Notes (Addendum)
Procedure Name: Intubation Date/Time: 01/08/2023 8:29 AM  Performed by: Irving Burton, CRNAPre-anesthesia Checklist: Patient identified, Patient being monitored, Timeout performed, Emergency Drugs available and Suction available Patient Re-evaluated:Patient Re-evaluated prior to induction Oxygen Delivery Method: Circle system utilized Preoxygenation: Pre-oxygenation with 100% oxygen Induction Type: IV induction Ventilation: Mask ventilation without difficulty Laryngoscope Size: 3 and McGrath (X-blade utilized) Grade View: Grade II Tube type: Oral Tube size: 7.0 mm Number of attempts: 2 Airway Equipment and Method: Stylet and Video-laryngoscopy Placement Confirmation: ETT inserted through vocal cords under direct vision, positive ETCO2 and breath sounds checked- equal and bilateral Secured at: 21 cm Tube secured with: Tape Dental Injury: Teeth and Oropharynx as per pre-operative assessment  Difficulty Due To: Difficulty was unanticipated, Difficult Airway- due to anterior larynx and Difficult Airway- due to immobile epiglottis Future Recommendations: Recommend- induction with short-acting agent, and alternative techniques readily available Comments: First attempt with Mcgrath 3 blade, difficult visualization due to copious soft tissue in oropharynx, anterior airway, stiff epiglottis. Second attempt with Mcgrath X-3 blade with better visualization, but some difficulty in passing ETT due to sharp angle of the blade and the aforementioned soft tissue.

## 2023-01-08 NOTE — Hospital Course (Addendum)
Patient is a 77 year old male with history of diabetes type 2, diastolic CHF, hyperlipidemia, iron deficiency anemia who presented for the evaluation of sacral wound.  It appears it is infected.  Wound culture previously showed MRSA.  Patient was placed on broad-spectrum antibiotics.  Patient also was brought to the OR on 12/25 for debridement.  Culture came back with MRSA, susceptible to doxycycline.  Discussed with infectious disease pharmacist, will continue doxycycline for additional 7 days to complete 10 days antibiotic course.  Patient be also followed by home health for wound care.
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
## 2023-01-08 NOTE — Anesthesia Postprocedure Evaluation (Signed)
Anesthesia Post Note  Patient: Donald [name]  Procedure(s) Performed: IRRIGATION AND DEBRIDEMENT SACRAL WOUND  Patient location during evaluation: PACU Anesthesia Type: General Level of consciousness: awake and alert Pain management: pain level controlled Vital Signs Assessment: post-procedure vital signs reviewed and stable Respiratory status: spontaneous breathing, nonlabored ventilation, respiratory function stable and patient connected to nasal cannula oxygen Cardiovascular status: blood pressure returned to baseline and stable Postop Assessment: no apparent nausea or vomiting Anesthetic complications: yes   Encounter Notable Events  Notable Event Outcome Phase Comment  Difficult to intubate - unexpected  <72 hours postprocedure Filed from anesthesia note documentation.     Last Vitals:  Vitals:   01/08/23 1005 01/08/23 1520  BP: (!) 152/49 (!) 124/49  Pulse: 69 (!) 54  Resp: 16 19  Temp: 36.6 C 36.7 C  SpO2: 94% 98%    Last Pain:  Vitals:   01/08/23 1742  TempSrc:   PainSc: 5                  Corinda Gubler
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
## 2023-01-08 NOTE — Transfer of Care (Signed)
Immediate Anesthesia Transfer of Care Note  Patient: Donald [name]  Procedure(s) Performed: IRRIGATION AND DEBRIDEMENT SACRAL WOUND  Patient Location: PACU  Anesthesia Type:General  Level of Consciousness: awake, alert , and oriented  Airway & Oxygen Therapy: Patient Spontanous Breathing  Post-op Assessment: Report given to RN and Post -op Vital signs reviewed and stable  Post vital signs: Reviewed and stable  Last Vitals:  Vitals Value Taken Time  BP 134/92   Temp    Pulse 92 01/08/23 0908  Resp    SpO2 95 % 01/08/23 0908  Vitals shown include unfiled device data.  Last Pain:  Vitals:   01/08/23 0433  TempSrc: Oral  PainSc:          Complications: No notable events documented.

## 2023-01-08 NOTE — Plan of Care (Signed)
Problem: Education: Goal: Ability to describe self-care measures that may prevent or decrease complications (Diabetes Survival Skills Education) will improve 01/08/2023 0230 by Ramon Dredge, RN Outcome: Progressing 01/08/2023 0230 by Ramon Dredge, RN Outcome: Progressing Goal: Individualized Educational Video(s) 01/08/2023 0230 by Ramon Dredge, RN Outcome: Progressing 01/08/2023 0230 by Ramon Dredge, RN Outcome: Progressing   Problem: Coping: Goal: Ability to adjust to condition or change in health will improve 01/08/2023 0230 by Ramon Dredge, RN Outcome: Progressing 01/08/2023 0230 by Ramon Dredge, RN Outcome: Progressing   Problem: Fluid Volume: Goal: Ability to maintain a balanced intake and output will improve 01/08/2023 0230 by Ramon Dredge, RN Outcome: Progressing 01/08/2023 0230 by Ramon Dredge, RN Outcome: Progressing   Problem: Health Behavior/Discharge Planning: Goal: Ability to identify and utilize available resources and services will improve 01/08/2023 0230 by Ramon Dredge, RN Outcome: Progressing 01/08/2023 0230 by Ramon Dredge, RN Outcome: Progressing Goal: Ability to manage health-related needs will improve 01/08/2023 0230 by Ramon Dredge, RN Outcome: Progressing 01/08/2023 0230 by Ramon Dredge, RN Outcome: Progressing   Problem: Metabolic: Goal: Ability to maintain appropriate glucose levels will improve 01/08/2023 0230 by Ramon Dredge, RN Outcome: Progressing 01/08/2023 0230 by Ramon Dredge, RN Outcome: Progressing   Problem: Nutritional: Goal: Maintenance of adequate nutrition will improve 01/08/2023 0230 by Ramon Dredge, RN Outcome: Progressing 01/08/2023 0230 by Ramon Dredge, RN Outcome: Progressing Goal: Progress toward achieving an optimal weight will improve 01/08/2023 0230 by Ramon Dredge, RN Outcome:  Progressing 01/08/2023 0230 by Ramon Dredge, RN Outcome: Progressing   Problem: Skin Integrity: Goal: Risk for impaired skin integrity will decrease 01/08/2023 0230 by Ramon Dredge, RN Outcome: Progressing 01/08/2023 0230 by Ramon Dredge, RN Outcome: Progressing   Problem: Tissue Perfusion: Goal: Adequacy of tissue perfusion will improve 01/08/2023 0230 by Ramon Dredge, RN Outcome: Progressing 01/08/2023 0230 by Ramon Dredge, RN Outcome: Progressing   Problem: Fluid Volume: Goal: Hemodynamic stability will improve 01/08/2023 0230 by Ramon Dredge, RN Outcome: Progressing 01/08/2023 0230 by Ramon Dredge, RN Outcome: Progressing   Problem: Clinical Measurements: Goal: Diagnostic test results will improve 01/08/2023 0230 by Ramon Dredge, RN Outcome: Progressing 01/08/2023 0230 by Ramon Dredge, RN Outcome: Progressing Goal: Signs and symptoms of infection will decrease 01/08/2023 0230 by Ramon Dredge, RN Outcome: Progressing 01/08/2023 0230 by Ramon Dredge, RN Outcome: Progressing   Problem: Respiratory: Goal: Ability to maintain adequate ventilation will improve 01/08/2023 0230 by Ramon Dredge, RN Outcome: Progressing 01/08/2023 0230 by Ramon Dredge, RN Outcome: Progressing   Problem: Education: Goal: Knowledge of General Education information will improve Description: Including pain rating scale, medication(s)/side effects and non-pharmacologic comfort measures 01/08/2023 0230 by Ramon Dredge, RN Outcome: Progressing 01/08/2023 0230 by Ramon Dredge, RN Outcome: Progressing   Problem: Health Behavior/Discharge Planning: Goal: Ability to manage health-related needs will improve 01/08/2023 0230 by Ramon Dredge, RN Outcome: Progressing 01/08/2023 0230 by Ramon Dredge, RN Outcome: Progressing   Problem: Clinical Measurements: Goal:  Ability to maintain clinical measurements within normal limits will improve 01/08/2023 0230 by Ramon Dredge, RN Outcome: Progressing 01/08/2023 0230 by Ramon Dredge, RN Outcome: Progressing Goal: Will remain free from infection 01/08/2023 0230 by Ramon Dredge, RN Outcome: Progressing 01/08/2023 0230 by Ramon Dredge, RN Outcome: Progressing Goal: Diagnostic test results will improve 01/08/2023 0230 by Ramon Dredge, RN Outcome: Progressing 01/08/2023  0230 by Ramon Dredge, RN Outcome: Progressing Goal: Respiratory complications will improve 01/08/2023 0230 by Ramon Dredge, RN Outcome: Progressing 01/08/2023 0230 by Ramon Dredge, RN Outcome: Progressing Goal: Cardiovascular complication will be avoided 01/08/2023 0230 by Ramon Dredge, RN Outcome: Progressing 01/08/2023 0230 by Ramon Dredge, RN Outcome: Progressing   Problem: Activity: Goal: Risk for activity intolerance will decrease 01/08/2023 0230 by Ramon Dredge, RN Outcome: Progressing 01/08/2023 0230 by Ramon Dredge, RN Outcome: Progressing   Problem: Nutrition: Goal: Adequate nutrition will be maintained 01/08/2023 0230 by Ramon Dredge, RN Outcome: Progressing 01/08/2023 0230 by Ramon Dredge, RN Outcome: Progressing   Problem: Coping: Goal: Level of anxiety will decrease 01/08/2023 0230 by Ramon Dredge, RN Outcome: Progressing 01/08/2023 0230 by Ramon Dredge, RN Outcome: Progressing   Problem: Elimination: Goal: Will not experience complications related to bowel motility 01/08/2023 0230 by Ramon Dredge, RN Outcome: Progressing 01/08/2023 0230 by Ramon Dredge, RN Outcome: Progressing Goal: Will not experience complications related to urinary retention 01/08/2023 0230 by Ramon Dredge, RN Outcome: Progressing 01/08/2023 0230 by Ramon Dredge, RN Outcome:  Progressing   Problem: Pain Management: Goal: General experience of comfort will improve 01/08/2023 0230 by Ramon Dredge, RN Outcome: Progressing 01/08/2023 0230 by Ramon Dredge, RN Outcome: Progressing   Problem: Safety: Goal: Ability to remain free from injury will improve 01/08/2023 0230 by Ramon Dredge, RN Outcome: Progressing 01/08/2023 0230 by Ramon Dredge, RN Outcome: Progressing   Problem: Skin Integrity: Goal: Risk for impaired skin integrity will decrease 01/08/2023 0230 by Ramon Dredge, RN Outcome: Progressing 01/08/2023 0230 by Ramon Dredge, RN Outcome: Progressing
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
## 2023-01-09 ENCOUNTER — Encounter: Payer: Self-pay | Admitting: General Surgery

## 2023-01-09 DIAGNOSIS — I5032 Chronic diastolic (congestive) heart failure: Secondary | ICD-10-CM

## 2023-01-09 DIAGNOSIS — R001 Bradycardia, unspecified: Secondary | ICD-10-CM

## 2023-01-09 DIAGNOSIS — Z22322 Carrier or suspected carrier of Methicillin resistant Staphylococcus aureus: Secondary | ICD-10-CM

## 2023-01-09 DIAGNOSIS — I480 Paroxysmal atrial fibrillation: Secondary | ICD-10-CM | POA: Diagnosis not present

## 2023-01-09 DIAGNOSIS — Z794 Long term (current) use of insulin: Secondary | ICD-10-CM

## 2023-01-09 DIAGNOSIS — E1165 Type 2 diabetes mellitus with hyperglycemia: Secondary | ICD-10-CM

## 2023-01-09 DIAGNOSIS — L03317 Cellulitis of buttock: Secondary | ICD-10-CM

## 2023-01-09 DIAGNOSIS — I25118 Atherosclerotic heart disease of native coronary artery with other forms of angina pectoris: Secondary | ICD-10-CM

## 2023-01-09 DIAGNOSIS — G4733 Obstructive sleep apnea (adult) (pediatric): Secondary | ICD-10-CM

## 2023-01-09 DIAGNOSIS — A419 Sepsis, unspecified organism: Secondary | ICD-10-CM

## 2023-01-09 DIAGNOSIS — R6520 Severe sepsis without septic shock: Secondary | ICD-10-CM

## 2023-01-09 DIAGNOSIS — S31000D Unspecified open wound of lower back and pelvis without penetration into retroperitoneum, subsequent encounter: Secondary | ICD-10-CM | POA: Diagnosis not present

## 2023-01-09 LAB — GLUCOSE, CAPILLARY
Glucose-Capillary: 157 mg/dL — ABNORMAL HIGH (ref 70–99)
Glucose-Capillary: 228 mg/dL — ABNORMAL HIGH (ref 70–99)
Glucose-Capillary: 276 mg/dL — ABNORMAL HIGH (ref 70–99)
Glucose-Capillary: 216 mg/dL — ABNORMAL HIGH (ref 70–99)
Glucose-Capillary: 234 mg/dL — ABNORMAL HIGH (ref 70–99)

## 2023-01-09 LAB — CREATININE, SERUM
Creatinine, Ser: 1.74 mg/dL — ABNORMAL HIGH (ref 0.61–1.24)
GFR, Estimated: 40 mL/min — ABNORMAL LOW

## 2023-01-09 MED ORDER — APIXABAN 5 MG PO TABS
5.0000 mg | ORAL_TABLET | Freq: Two times a day (BID) | ORAL | Status: DC
Start: 2023-01-09 — End: 2023-01-10
  Administered 2023-01-09 – 2023-01-10 (×3): 5 mg via ORAL
  Filled 2023-01-09 (×3): qty 1

## 2023-01-09 MED ORDER — INSULIN GLARGINE-YFGN 100 UNIT/ML ~~LOC~~ SOLN
10.0000 [IU] | Freq: Every day | SUBCUTANEOUS | Status: DC
  Administered 2023-01-09: 10 [IU] via SUBCUTANEOUS
  Filled 2023-01-09 (×2): qty 0.1

## 2023-01-09 MED ORDER — INSULIN ASPART 100 UNIT/ML IJ SOLN
4.0000 [IU] | Freq: Three times a day (TID) | INTRAMUSCULAR | Status: DC
  Administered 2023-01-09 – 2023-01-10 (×3): 4 [IU] via SUBCUTANEOUS
  Filled 2023-01-09 (×3): qty 1

## 2023-01-09 MED ORDER — LOSARTAN POTASSIUM 25 MG PO TABS
25.0000 mg | ORAL_TABLET | Freq: Every day | ORAL | Status: DC
  Administered 2023-01-09 – 2023-01-10 (×2): 25 mg via ORAL
  Filled 2023-01-09 (×2): qty 1

## 2023-01-09 MED ORDER — FUROSEMIDE 40 MG PO TABS
80.0000 mg | ORAL_TABLET | Freq: Every day | ORAL | Status: DC
  Administered 2023-01-10: 80 mg via ORAL
  Filled 2023-01-09: qty 2

## 2023-01-09 MED ORDER — INSULIN ASPART 100 UNIT/ML IJ SOLN
0.0000 [IU] | Freq: Three times a day (TID) | INTRAMUSCULAR | Status: DC
  Administered 2023-01-09: 3 [IU] via SUBCUTANEOUS
  Administered 2023-01-10 (×2): 2 [IU] via SUBCUTANEOUS
  Filled 2023-01-09 (×3): qty 1

## 2023-01-09 MED ORDER — LACTULOSE 10 GM/15ML PO SOLN
20.0000 g | Freq: Once | ORAL | Status: AC
  Administered 2023-01-09: 20 g via ORAL
  Filled 2023-01-09: qty 30

## 2023-01-09 NOTE — Care Management Important Message (Signed)
Important Message  Patient Details  Name: Donald [name] MRN: [id number] Date of Birth: 1945-09-13   Important Message Given:  Yes - Medicare IM     Sherilyn Banker 01/09/2023, 12:34 PM
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
## 2023-01-09 NOTE — Consult Note (Signed)
Cardiology Consultation   Patient ID: Donald [name] MRN: [id number]; DOB: 05/21/1945  Admit date: 01/06/2023 Date of Consult: 01/09/2023  PCP:  [name], MD   [facility] Providers Cardiologist:  Julien Nordmann, MD        Patient Profile:   Donald [name] is a 77 y.o. male with a hx of HFpEF, nonobstructive coronary artery disease, poorly controlled insulin-dependent diabetes, hyperlipidemia, PAD, DVT and extensive bilateral pulmonary embolism (2009), CKD stage III, OSA on CPAP, asthmatic bronchitis, morbid obesity, gout, GERD, anemia, history of hematuria, status post parathyroidectomy (right-sided, 2018), hypercalcemia, chronic back pain status post surgeries, who is being seen 01/09/2023 for the evaluation of atrial fibrillation SVR at the request of Dr. Chipper Herb.  History of Present Illness:   Donald [name] in September of 2009 he had a left-sided DVT and extensive bilateral PE that was felt to be idiopathic.  He remained on Coumadin until October 2010 with follow-up CTA negative for PE that showed coronary artery calcification and cardiomegaly.  He had subsequent 2012 echocardiogram that showed an LVEF of 60-70%, mild LVH, mild LAE/RAE, and was without wall motion abnormalities.  He had an abnormal EKG in 2018 with stress testing that revealed no significant ischemia.  He was also found to have paroxysmal atrial fibrillation and declines anticoagulation due to cost.  He was also noted to have aortic valve stenosis during October 2022 after an episode of syncope.  He was last seen in clinic 06/24/2022 by Dr. [name].  At that time he was doing well with no concerns of cardiac decompensation.  He presented to the [facility] emergency department on 01/06/23 by private vehicle from home.  He reported a wound to his lower back that was being treated outpatient and was unsuccessful and the wound was getting worse.  He stated he was sent to the emergency department for IV  antibiotic therapy.  Upon further evaluation he had a cellulitis infection that failed outpatient management of the sacral area.  He had not had any disease and not found to have abscess.  But did have a history of diabetes was on antibiotics.  He also tested positive for MRSA.  He was placed on broad-spectrum antibiotics.  He was taken to the OR on 12/25 for debridement.  Culture was sent again.  After his procedure he was noted to be in atrial fibrillation with slow ventricular rate dropping down as low as 37 while asleep with pauses up to 4.28 seconds on telemetry monitoring.  Initial vital signs: Blood pressure 143/52, pulse 60, respirations 20, temperature 97.8  Pertinent labs: Hemoglobin 12.1, sodium 131, chloride 92, blood glucose that is doing well, BUN 63, serum creatinine 1.96, calcium 8.7, lactic acid 2.9  Imaging: CT of the pelvis revealed soft tissue infiltration with possible developing collection in the subcutaneous fat of the coccyx with no evidence of osteomyelitis, bladder wall thickening may indicate cystitis or outlet obstruction and needed to be correlated with urinalysis and aortic atherosclerosis  Medications administered in the emergency department: Cefepime 2 g IVPB, Flagyl 500 mg IVPB, vancomycin 1000 mg IVPB, hydrocodone-acetaminophen 5/325 1 tablet, and 500 mL normal saline bolus  Cardiology was consulted for concerns of atrial fibrillation with SVR and pauses throughout the night noted on telemetry.  Past Medical History:  Diagnosis Date   (HFpEF) heart failure with preserved ejection fraction (HCC)    a. 09/2018 Echo: EF 60-65%. PASP . Mild-mod LAE. Mild MR/TR.   ANEMIA-IRON DEFICIENCY 07/26/2006   Anxiety  ASTHMA 07/26/2006   Asthma    Back pain, chronic    Bell's palsy    CKD (chronic kidney disease), stage III (HCC)    COLONIC POLYPS, HX OF 07/26/2006   COVID-19 virus infection 01/2020   DEPRESSION 03/14/2009   DIABETES MELLITUS, TYPE II 07/26/2006    DISC DISEASE, LUMBAR 10/05/2007   DVT 12/03/2007   DYSLIPIDEMIA 04/13/2009   Dyspnea    when gets up and walks around and back is hurting really bad -only Shortness of breath  then   GERD 07/26/2006   Gout    Heart murmur    History of kidney stones    HYPERTENSION 07/26/2006   INSOMNIA 08/21/2007   Neuropathy    Nonobstructive CAD (coronary artery disease)    a. 2012 Cath: no high grade stenosis; b. 2018 MV: No ischemia. Attenuation artifact.    OBSTRUCTIVE SLEEP APNEA 12/03/2007   Use C-PAP   PERIPHERAL NEUROPATHY 07/26/2006   Pernicious anemia 11/20/2006   PULMONARY EMBOLISM 10/05/2007   Syncope and collapse 10/30/2020   zio patch 11/2020: Predominant sinus bradycardia with an average rate of 54 betas per minute.  Type I second degree heart block was occasionally present w/o significant pauses. Short lived SVT x1 with occasional symptomatic PVCs, ventricular trigeminy present     Vertigo     Past Surgical History:  Procedure Laterality Date   APPENDECTOMY  1968   BACK SURGERY  1977, 06/14/2008   Dr. Dutch Quint at [facility] [phone])   CARDIAC CATHETERIZATION     CARPAL TUNNEL RELEASE Right 10/2017   CATARACT EXTRACTION W/PHACO Left 05/30/2014   Procedure: CATARACT EXTRACTION PHACO AND INTRAOCULAR LENS PLACEMENT (IOC);  Surgeon: Sallee Lange, MD;  Location: [facility] ORS;  Service: Ophthalmology;  Laterality: Left;  [name] 01:20 AP% 23.7 CDE 31.86   CATARACT EXTRACTION W/PHACO Right 09/06/2021   Procedure: CATARACT EXTRACTION PHACO AND INTRAOCULAR LENS PLACEMENT (IOC) RIGHT DIABETIC VISION BLUE OMIDRIA MALYUGIN IRIS HOOKS;  Surgeon: Estanislado Pandy, MD;  Location: [facility];  Service: Ophthalmology;  Laterality: Right;  14.65 2.11.1   COLONOSCOPY  10/2002   HP, SSA, TA, rpt 3 yrs (Medoff)   COLONOSCOPY  01/2005   diverticulosis rpt 5 yrs (Medoff)    EPIDURAL BLOCK INJECTION Bilateral 06/2021   bilat L5 nerve block with benefit (Ramos)   INCISION AND DRAINAGE  PERIRECTAL ABSCESS N/A 01/08/2023   Procedure: IRRIGATION AND DEBRIDEMENT SACRAL WOUND;  Surgeon: Kandis Cocking, MD;  Location: [facility] ORS;  Service: General;  Laterality: N/A;   IR NEPHROSTOMY PLACEMENT LEFT  10/19/2018   IR URETERAL STENT LEFT NEW ACCESS W/O SEP NEPHROSTOMY CATH  10/19/2018   LEFT HEART CATH AND CORONARY ANGIOGRAPHY N/A 11/01/2020   Procedure: LEFT HEART CATH AND CORONARY ANGIOGRAPHY;  Surgeon: Yvonne Kendall, MD;  Location: [facility] INVASIVE CV LAB;  Service: Cardiovascular;  Laterality: N/A;   LITHOTRIPSY     X 2   NEPHROLITHOTOMY Left 10/19/2018   Procedure: NEPHROLITHOTOMY PERCUTANEOUS;  Surgeon: Marcine Matar, MD;  Location: [facility];  Service: Urology;  Laterality: Left;  3 HRS   PARATHYROIDECTOMY Right 04/18/2016   PARATHYROIDECTOMY for cyst Bud Face, MD)   SHOULDER ARTHROSCOPY W/ ROTATOR CUFF REPAIR Right    TONSILLECTOMY       Home Medications:  Prior to Admission medications   Medication Sig Start Date End Date Taking? Authorizing Provider  albuterol (VENTOLIN HFA) 108 (90 Base) MCG/ACT inhaler Inhale 2 puffs into the lungs every 6 (six) hours as needed for wheezing or  shortness of breath. 02/04/20  Yes [name], MD  allopurinol (ZYLOPRIM) 300 MG tablet TAKE ONE TABLET BY MOUTH EVERY DAY 07/30/22  Yes [name], MD  ALPRAZolam (XANAX) 1 MG tablet TAKE 1 TABLET BY MOUTH AT BEDTIME IF NEEDED FOR SLEEP. Patient taking differently: Take 0.5 mg by mouth at bedtime. 09/20/21  Yes [name], MD  aspirin 81 MG EC tablet Take 81 mg by mouth daily. Swallow whole.   Yes [provider]  Cholecalciferol (VITAMIN D3) 25 MCG (1000 UT) CAPS Take 1 capsule (1,000 Units total) by mouth daily. 03/17/20  Yes [name], MD  colchicine 0.6 MG tablet TAKE 1 TABLET BY MOUTH DAILY AS NEEDED FOR GOUT GLARE. ON FIRST DAY OF GOUT FLARE,MAY TAKE 2 TABLETS AT ONCE AS DIRECTED 10/18/21  Yes [name], MD  cyanocobalamin (VITAMIN B12) 1000  MCG/ML injection INJECT INTRAMUSCULARLY EVERY 30 DAYS 04/23/22  Yes [name], MD  cyclobenzaprine (FLEXERIL) 10 MG tablet Take 1 tablet (10 mg total) by mouth 3 (three) times daily as needed for muscle spasms. 11/21/22  Yes Karie Schwalbe, MD  doxazosin (CARDURA) 1 MG tablet TAKE ONE TABLET BY MOUTH TWICE DAILY 07/09/22  Yes Gollan, Tollie Pizza, MD  doxycycline (VIBRA-TABS) 100 MG tablet Take 1 tablet (100 mg total) by mouth 2 (two) times daily. 01/01/23  Yes [name], MD  fluticasone [facility]) 50 MCG/ACT nasal spray Place 2 sprays into both nostrils daily. 06/18/19  Yes Emi Belfast, FNP  furosemide (LASIX) 80 MG tablet TAKE 1 TABLET BY MOUTH EVERY DAY. MAY TAKE AN EXTRA PILL FOR SWELLING. 11/07/22  Yes Gollan, Tollie Pizza, MD  gabapentin (NEURONTIN) 600 MG tablet TAKE TWO TABLETS AT BEDTIME 09/23/22  Yes [name], MD  HYDROcodone-acetaminophen [facility]) 10-325 MG tablet Take 1 tablet by mouth every 6 (six) hours as needed for moderate pain ((typically once to twice daily if needed)).  12/19/17  Yes [name], MD  isosorbide mononitrate (IMDUR) 30 MG 24 hr tablet TAKE ONE TABLET BY MOUTH DAILY 02/26/22  Yes Antonieta Iba, MD  losartan (COZAAR) 25 MG tablet TAKE ONE TABLET BY MOUTH AT BEDTIME 10/29/22  Yes [name], MD  lovastatin (MEVACOR) 40 MG tablet TAKE TWO TABLETS BY MOUTH AT BEDTIME 01/01/23  Yes Gollan, Tollie Pizza, MD  meclizine (ANTIVERT) 25 MG tablet Take 25 mg by mouth 3 (three) times daily as needed for dizziness.   Yes [provider]  Naphazoline-Pheniramine (OPCON-A OP) Place 1 drop into both eyes 3 (three) times daily as needed (itchy eyes.).    Yes [provider]  nitroGLYCERIN (NITROSTAT) 0.4 MG SL tablet Place 1 tablet (0.4 mg total) under the tongue every 5 (five) minutes as needed for chest pain. 12/17/21  Yes Gollan, Tollie Pizza, MD  NOVOLIN N 100 UNIT/ML injection INJECT 160-170 UNITS TOTAL INTO THE SKIN AT BEDTIME  01/05/23  Yes Shamleffer, Konrad Dolores, MD  NOVOLIN R 100 UNIT/ML injection INJECT 140 UNITS IN THE MORNING, 140 UNITS IN THE AFTERNOON, AND 160-180 UNITS IN THE EVENING USE BEFORE A MEAL 11/29/22  Yes Shamleffer, Konrad Dolores, MD  Potassium Citrate 15 MEQ (1620 MG) TBCR Take 1 tablet by mouth 3 (three) times daily. Takes 2 tablets daily   Yes [provider]  Sennosides-Docusate Sodium (STOOL SOFTENER & LAXATIVE PO) Take 2 tablets by mouth at bedtime.   Yes [provider]  silver sulfADIAZINE (SILVADENE) 1 % cream Apply topically 2 (two) times daily. 12/25/22  Yes [provider]  cephALEXin (KEFLEX) 500 MG capsule Take 1 capsule (500 mg total) by mouth 4 (four) times daily. Patient not taking: Reported on 01/06/2023 01/01/23   [name], MD  Continuous Glucose Sensor (FREESTYLE LIBRE 2 SENSOR) MISC 1 Device by Does not apply route every 14 (fourteen) days. 09/20/22   Shamleffer, Konrad Dolores, MD  glucose blood (GE100 BLOOD GLUCOSE TEST) test strip TEST FOUR TIMES DAILY 08/30/21   Shamleffer, Konrad Dolores, MD  ondansetron (ZOFRAN) 4 MG tablet Take 1 tablet (4 mg total) by mouth 3 (three) times daily as needed for nausea. Patient not taking: Reported on 01/06/2023 12/31/21   [name], MD  Semaglutide, 1 MG/DOSE, 4 MG/3ML SOPN Inject 1 mg as directed once a week. Patient not taking: Reported on 01/06/2023 12/21/22   [name], MD  Syringe/Needle, Disp, (SYRINGE 3CC/22GX1-1/2") 22G X 1-1/2" 3 ML MISC Use to administer monthly b12 shots 03/18/20   [name], MD    Inpatient Medications: Scheduled Meds:  allopurinol  100 mg Oral Daily   apixaban  5 mg Oral BID   fluticasone  2 spray Each Nare Daily   gabapentin  1,200 mg Oral QHS   insulin aspart  0-9 Units Subcutaneous TID WC   insulin aspart  4 Units Subcutaneous TID WC   insulin glargine-yfgn  10 Units Subcutaneous QHS   pantoprazole (PROTONIX) IV  40 mg Intravenous Q12H    pravastatin  40 mg Oral q1800   silver sulfADIAZINE   Topical BID   Continuous Infusions:  vancomycin 750 mg (01/08/23 2146)   PRN Meds: albuterol, hydrALAZINE, HYDROcodone-acetaminophen, nitroGLYCERIN  Allergies:    Allergies  Allergen Reactions   Codeine Other (See Comments)    REACTION: chest pain   Pioglitazone Other (See Comments)    REACTION to Actos: swelling in ankles    Social History:   Social History   Socioeconomic History   Marital status: Married    Spouse name: Not on file   Number of children: Not on file   Years of education: Not on file   Highest education level: Not on file  Occupational History   Occupation: Disabled    Employer: [employer]  Tobacco Use   Smoking status: Never   Smokeless tobacco: Never  Vaping Use   Vaping status: Never Used  Substance and Sexual Activity   Alcohol use: No   Drug use: No   Sexual activity: Not on file  Other Topics Concern   Not on file  Social History Narrative   Married   Children   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   Activity is severely limited by medical problems   Diet is "good".   Never a smoker   Alcohol: none         Social Drivers of Corporate investment banker Strain: Medium Risk (02/07/2022)   Overall Financial Resource Strain (CARDIA)    Difficulty of Paying Living Expenses: Somewhat hard  Food Insecurity: No Food Insecurity (01/07/2023)   Hunger Vital Sign    Worried About Running Out of Food in the Last Year: Never true    Ran Out of Food in the Last Year: Never true  Transportation Needs: No Transportation Needs (01/07/2023)   PRAPARE - Administrator, Civil Service (Medical): No    Lack of Transportation (Non-Medical): No  Physical Activity: Inactive (04/20/2019)   Exercise Vital Sign    Days of Exercise per Week: 0 days    Minutes of Exercise per Session: 0  min  Stress: No Stress Concern Present (04/20/2019)   [employer] of Occupational Health -  Occupational Stress Questionnaire    Feeling of Stress : Not at all  Social Connections: Not on file  Intimate Partner Violence: Not At Risk (01/07/2023)   Humiliation, Afraid, Rape, and Kick questionnaire    Fear of Current or Ex-Partner: No    Emotionally Abused: No    Physically Abused: No    Sexually Abused: No    Family History:    Family History  Problem Relation Age of Onset   Cancer Mother        Breast Cancer   Cancer Sister        Breast Cancer   Diabetes Father    Heart disease Father    Diabetes Paternal Grandfather      ROS:  Please see the history of present illness.  Review of Systems  Constitutional:  Positive for malaise/fatigue.  Musculoskeletal:  Positive for back pain.  Neurological:  Positive for dizziness and weakness.    All other ROS reviewed and negative.     Physical Exam/Data:   Vitals:   01/09/23 0455 01/09/23 0500 01/09/23 0801 01/09/23 1534  BP: (!) 122/50  129/88 (!) 156/59  Pulse: (!) 41  97 66  Resp: 20  18 16   Temp: 97.7 F (36.5 C)  98 F (36.7 C) 98 F (36.7 C)  TempSrc: Axillary     SpO2: 99%  98% 97%  Weight:  101.1 kg    Height:        Intake/Output Summary (Last 24 hours) at 01/09/2023 1640 Last data filed at 01/09/2023 0900 Gross per 24 hour  Intake 340 ml  Output --  Net 340 ml      01/09/2023    5:00 AM 01/08/2023    2:29 AM 01/07/2023    8:33 PM  Last 3 Weights  Weight (lbs) 222 lb 14.2 oz 235 lb 14.3 oz 235 lb 14.3 oz  Weight (kg) 101.1 kg 107 kg 107 kg     Body mass index is 39.48 kg/m.  General:  Well nourished, well developed, in no acute distress HEENT: normal Neck: no JVD Vascular: No carotid bruits; Distal pulses 2+ bilaterally Cardiac:  normal S1, S2; IR IR; no murmur  Lungs:  clear to auscultation bilaterally, no wheezing, rhonchi or rales, respirations are unlabored at rest on room air Abd: soft, nontender, no hepatomegaly  Ext: no edema Musculoskeletal:  No deformities, BUE and BLE  strength normal and equal Skin: warm and dry  Neuro:  CNs 2-12 intact, no focal abnormalities noted Psych:  Normal affect   EKG:  The EKG was personally reviewed and demonstrates: Atrial fibrillation with SVR rate of 47 with a right bundle branch block and a left axis deviation Telemetry:  Telemetry was personally reviewed and demonstrates:  Atrial fibrillation with SVR rates 30-50 with pauses noted throughout the night the longest being over 4 seconds  Relevant CV Studies: LHC 11/01/2020 Conclusions: Mild, non-obstructive coronary artery disease, as detailed below.  No significant stenosis identified to explain syncope or cardiomyopathy. Normal left ventricular filling pressure. Mild-moderate aortic valve stenosis based on peak-to-peak gradient (~20 mmHg).   Recommendations: Optimize goal-directed medical therapy for management of non-ischemic cardiomyopathy. Further workup of syncope per rounding and hospitalist teams.  Consider ambulatory cardiac telemetry at discharge. If no evidence of bleeding or vascular injury, DOAC could be started as soon as tomorrow.  We will restart IV heparin 2 hours after TR  band removal. Medical therapy and risk factor modification to prevent progression of coronary artery disease.    2D echo 10/31/2020  1. Left ventricular ejection fraction, by estimation, is 45 to 50%. The  left ventricle has mildly decreased function. The left ventricle  demonstrates global hypokinesis. There is moderate left ventricular  hypertrophy. Left ventricular diastolic  parameters are consistent with Grade III diastolic dysfunction  (restrictive). Elevated left atrial pressure.   2. Right ventricular systolic function is normal. The right ventricular  size is normal.   3. Left atrial size was mildly dilated.   4. The mitral valve is degenerative. Mild mitral valve regurgitation. No  evidence of mitral stenosis.   5. The aortic valve has an indeterminant number of cusps.  There is mild  calcification of the aortic valve. There is moderate thickening of the  aortic valve. Aortic valve regurgitation is trivial. Mild to moderate  aortic valve sclerosis/calcification is  present, without any evidence of aortic stenosis.    Laboratory Data:  High Sensitivity Troponin:  No results for input(s): "TROPONINIHS" in the last 720 hours.   Chemistry Recent Labs  Lab 01/06/23 1612 01/07/23 0635 01/08/23 0550 01/09/23 0528  NA 131* 130* 137  --   K 4.8 5.0 4.6  --   CL 92* 96* 101  --   CO2 25 24 29   --   GLUCOSE 225* 196* 172*  --   BUN 63* 61* 55*  --   CREATININE 1.96* 1.78* 1.88* 1.74*  CALCIUM 8.7* 8.2* 8.6*  --   GFRNONAA 35* 39* 37* 40*  ANIONGAP 14 10 7   --     Recent Labs  Lab 01/06/23 1612 01/07/23 0635  PROT 6.4* 5.2*  ALBUMIN 3.0* 2.4*  AST 26 21  ALT 25 23  ALKPHOS 78 58  BILITOT 0.9 1.7*   Lipids No results for input(s): "CHOL", "TRIG", "HDL", "LABVLDL", "LDLCALC", "CHOLHDL" in the last 168 hours.  Hematology Recent Labs  Lab 01/06/23 1612 01/07/23 0635  WBC 9.0 8.0  RBC 4.08* 3.65*  HGB 12.1* 10.7*  HCT 36.3* 31.8*  MCV 89.0 87.1  MCH 29.7 29.3  MCHC 33.3 33.6  RDW 14.1 14.2  PLT 330 287   Thyroid No results for input(s): "TSH", "FREET4" in the last 168 hours.  BNPNo results for input(s): "BNP", "PROBNP" in the last 168 hours.  DDimer No results for input(s): "DDIMER" in the last 168 hours.   Radiology/Studies:  CT PELVIS W CONTRAST Result Date: 01/06/2023 CLINICAL DATA:  Soft tissue infection suspected. Wound in the coccygeal area developing about 2 weeks ago. EXAM: CT PELVIS WITH CONTRAST TECHNIQUE: Multidetector CT imaging of the pelvis was performed using the standard protocol following the bolus administration of intravenous contrast. RADIATION DOSE REDUCTION: This exam was performed according to the departmental dose-optimization program which includes automated exposure control, adjustment of the mA and/or kV  according to patient size and/or use of iterative reconstruction technique. CONTRAST:  75mL OMNIPAQUE IOHEXOL 350 MG/ML SOLN COMPARISON:  03/07/2020 FINDINGS: Urinary Tract: Diffuse bladder wall thickening may indicate cystitis or outlet obstruction. Correlate with urinalysis. No stones or filling defects. Bowel: Visualized portions of large and small bowel are not abnormally distended. Sigmoid colonic diverticulosis without evidence of acute diverticulitis. Vascular/Lymphatic: Aortoiliac calcification.  No aneurysm. Reproductive:  Prostate gland is not enlarged. Other: Diffuse soft tissue infiltration with some calcification in the anterior abdominal wall fat. This is similar to prior study and likely represents postoperative change or chronic scarring. Soft tissue infiltration  in the subcutaneous fat dorsal to the coccyx with poorly defined increased density possibly representing developing collection measuring 2.6 x 5.6 cm. This may represent cellulitis or early abscess. Underlying bone appears intact. No bone erosion to suggest osteomyelitis. Musculoskeletal: Degenerative changes in the lower lumbar spine. Posterior laminectomy with posterior fixation and intervertebral fusion at L4-5. Degenerative changes in the hips. IMPRESSION: 1. Soft tissue infiltration with possible developing collection in the subcutaneous fat over the coccyx. No evidence of osteomyelitis. 2. Bladder wall thickening may indicate cystitis or outlet obstruction. Correlate with urinalysis. 3. Aortic atherosclerosis. Electronically Signed   By: Burman Nieves M.D.   On: 01/06/2023 19:09     Assessment and Plan:   Atrial fibrillation with SVR -Atrial fibrillation with rates as low as 37 with pauses of up to 4.28 seconds noted on telemetry -Patient typically with lower rates during the night while sleeping -Remains asymptomatic -Continued on telemetry monitoring -He is chronotropically appropriate with increased activity has  increased heart rate -Continue to avoid AV nodal blocking agents -Started on apixaban 5 mg twice daily for CHA2DS2-VASc score of at least 5 for stroke prophylaxis -Patient had currently been resistant to any anticoagulation due to increased cost -No current indication for pacemaker -If he becomes symptomatic to lower heart rates can consider ZIO monitoring  Sacral wound with infection secondary to MRSA -Patient arrived to the emergency department after failing outpatient treatment for sacral wound -Had debridement completed in the OR 12/25 -CT of the pelvis showed soft tissue infiltration with possible developing collection and subcutaneous fat over the Kocsis without evidence of osteomyelitis -Cultures still pending -Positive for Staph aureus presumed to be MRSA -Continued on vancomycin while pending culture results -Continue management per IM  Chronic combined systolic/diastolic CHF -Last echocardiogram revealed LVEF of 45-50% with global hypokinesis and G3 DD -Denies any shortness of breath or peripheral edema -Appears to be euvolemic on exam -Daily weights, I's and O's, low-sodium diet  Coronary artery disease -Significant three-vessel coronary calcification seen on CT scan -Prior stress Myoview with no significant ischemia -Cardiac catheterization October 2022 with nonobstructive disease -Continue with statin -Continue with apixaban and lieu of aspirin -Remains chest pain-free -EKG is nonacute -No further ischemic workup needed at this time  Uncontrolled type 2 diabetes -Continued on insulin therapy -Continued management per IM  OSA -Continue with CPAP nightly  CKD stage IIIb -Serum creatinine 1.74 -Currently at baseline -Monitor urine output -Monitor/trend/replete electrolytes as needed -Daily BMP -Avoid nephrotoxic agents were able    Risk Assessment/Risk Scores:          CHA2DS2-VASc Score = 5   This indicates a 7.2% annual risk of stroke. The patient's  score is based upon: CHF History: 1 HTN History: 1 Diabetes History: 1 Stroke History: 0 Vascular Disease History: 0 Age Score: 2 Gender Score: 0         For questions or updates, please contact [facility] Please consult [email or web address] for contact info under    Signed, Genesis Paget, NP  01/09/2023 4:40 PM

## 2023-01-09 NOTE — TOC Progression Note (Signed)
Transition of Care [facility]) - Progression Note    Patient Details  Name: Donald [name] MRN: [id number] Date of Birth: 1945-01-18  Transition of Care [facility]) CM/SW Contact  Allena Katz, LCSW Phone Number: 01/09/2023, 9:50 AM  Clinical Narrative:   Per RN pt experiences  Decreased ROM in right arm and unable to pack his wound due to him getting his right arm caught in a cabinet.         Expected Discharge Plan and Services                                               Social Determinants of Health (SDOH) Interventions SDOH Screenings   Food Insecurity: No Food Insecurity (01/07/2023)  Housing: Low Risk  (01/07/2023)  Transportation Needs: No Transportation Needs (01/07/2023)  Utilities: Not At Risk (01/07/2023)  Alcohol Screen: Low Risk  (04/20/2019)  Depression (PHQ2-9): Low Risk  (01/01/2023)  Financial Resource Strain: Medium Risk (02/07/2022)  Physical Activity: Inactive (04/20/2019)  Stress: No Stress Concern Present (04/20/2019)  Tobacco Use: Low Risk  (01/06/2023)    Readmission Risk Interventions     No data to display

## 2023-01-09 NOTE — Inpatient Diabetes Management (Signed)
Inpatient Diabetes Program Recommendations  AACE/ADA: New Consensus Statement on Inpatient Glycemic Control  Target Ranges:  Prepandial:   less than 140 mg/dL      Peak postprandial:   less than 180 mg/dL (1-2 hours)      Critically ill patients:  140 - 180 mg/dL    Latest Reference Range & Units 01/08/23 08:04 01/08/23 09:13 01/08/23 11:40 01/08/23 15:19 01/08/23 21:40 01/09/23 08:02  Glucose-Capillary 70 - 99 mg/dL 841 (H) 660 (H) 630 (H) 337 (H) 277 (H) 228 (H)   Review of Glycemic Control  Diabetes history: DM2 Outpatient Diabetes medications: NPH 160-170 units at bedtime, Regular 140 units with breakfast and lunch, Regular 160-180 units with supper Current orders for Inpatient glycemic control: Novolog 0-20 units TID with meals  Inpatient Diabetes Program Recommendations:    Insulin: Please consider ordering Semglee 10 units Q24H and ordering Novolog 4 units TID with meals for meal coverage if patient eats at least 50% of meals.  Thanks, Orlando Penner, RN, MSN, CDCES Diabetes Coordinator Inpatient Diabetes Program [phone] (Team Pager from 8am to 5pm)

## 2023-01-09 NOTE — Plan of Care (Signed)

## 2023-01-09 NOTE — Evaluation (Signed)
Occupational Therapy Evaluation Patient Details Name: Donald [name] MRN: [id number] DOB: Oct 15, 1945 Today's Date: 01/09/2023   History of Present Illness Pt is a 77 y.o. male admitted with infected sacral wound and sepsis.  I&D of sacral wound performed 12/25. PMH significant for diabetes mellitus type 2, HFpEF, asthma, Dyslipidemia, iron deficiency anemia   Clinical Impression   Prior to hospital admission, pt was modified independent with ADLs and mobility, using a SPC intermittently. Pt lives with spouse and is the primary cook in the household. Pt reports frequent falls (typically rolling OOB while asleep) and one recent fall 2-3 days ago where his RUE was caught in cabinet. Since fall, he has noticed decreased RUE ROM limiting participation in ADLs (notified care team via secure chat). Of note, pt does not currently have WFL ROM to coordinate posterior sacral wound care if needed upon discharge.   Pt currently requires minA - setup for UB ADLs, completes functional mobility t/f bathroom using RW with CGA, toileting hygiene with supervision, stands for hand washing CGA at sink. Pt would benefit from skilled OT services to address noted impairments and functional limitations (see below for any additional details) in order to maximize safety and independence while minimizing falls risk and caregiver burden. Anticipate the need for follow up [facility] OT services upon acute hospital DC.        If plan is discharge home, recommend the following: A little help with walking and/or transfers;A little help with bathing/dressing/bathroom;Assistance with cooking/housework    Functional Status Assessment  Patient has had a recent decline in their functional status and demonstrates the ability to make significant improvements in function in a reasonable and predictable amount of time.  Equipment Recommendations  None recommended by OT    Recommendations for Other Services Other (comment)      Precautions / Restrictions Precautions Precautions: Fall Precaution Comments: sacral wound Restrictions Weight Bearing Restrictions Per Provider Order: No      Mobility Bed Mobility Overal bed mobility: Needs Assistance Bed Mobility: Supine to Sit     Supine to sit: Supervision          Transfers Overall transfer level: Needs assistance Equipment used: Rolling walker (2 wheels) Transfers: Sit to/from Stand, Bed to chair/wheelchair/BSC Sit to Stand: Contact guard assist     Step pivot transfers: Contact guard assist     General transfer comment: cuing for hand placement      Balance Overall balance assessment: Needs assistance Sitting-balance support: No upper extremity supported, Feet supported Sitting balance-Leahy Scale: Good     Standing balance support: Bilateral upper extremity supported Standing balance-Leahy Scale: Fair                             ADL either performed or assessed with clinical judgement   ADL Overall ADL's : Needs assistance/impaired Eating/Feeding: Set up;Sitting Eating/Feeding Details (indicate cue type and reason): pt eating sitting EOB Grooming: Sitting;Standing;Contact guard assist   Upper Body Bathing: Sitting;Set up;Contact guard assist;Standing   Lower Body Bathing: Sit to/from stand;Contact guard assist   Upper Body Dressing : Sitting;Minimal assistance Upper Body Dressing Details (indicate cue type and reason): minA due to RUE ROM deficits Lower Body Dressing: Sit to/from stand;Contact guard assist   Toilet Transfer: Contact guard assist;Rolling walker (2 wheels);Regular Toilet;Grab bars   Toileting- Clothing Manipulation and Hygiene: Supervision/safety;Sit to/from stand       Functional mobility during ADLs: Contact guard assist;Rolling walker (2 wheels)  Vision Baseline Vision/History: 1 Wears glasses Ability to See in Adequate Light: 0 Adequate Patient Visual Report: No change from baseline               Pertinent Vitals/Pain Pain Assessment Pain Assessment: 0-10 Pain Score: 4  Pain Location: back/sacrum Pain Descriptors / Indicators: Grimacing Pain Intervention(s): Limited activity within patient's tolerance, Monitored during session     Extremity/Trunk Assessment Upper Extremity Assessment RUE Deficits / Details: pt with c/o not being able to lift RUE, onset 2-3 days ago. 0-45 deg flexion, unable to completely extend elbow without elevating shoulder. Pt reports falling and potentially catching his arm/shoulder on the cabnient. No pain or sensation changes.       Cervical / Trunk Assessment Cervical / Trunk Assessment: Other exceptions (forward head rounded shoulders)   Communication Communication Communication: No apparent difficulties   Cognition Arousal: Alert Behavior During Therapy: WFL for tasks assessed/performed Overall Cognitive Status: Within Functional Limits for tasks assessed                                                  Home Living Family/patient expects to be discharged to:: Private residence Living Arrangements: Spouse/significant other Available Help at Discharge: Family Type of Home: House Home Access: Stairs to enter Secretary/administrator of Steps: 1 Entrance Stairs-Rails: None Home Layout: One level     Bathroom Shower/Tub: Producer, television/film/video: Handicapped height     Home Equipment: Cane - single point;Grab bars - tub/shower;Shower Counsellor (2 wheels)          Prior Functioning/Environment Prior Level of Function : Independent/Modified Independent;Driving;History of Falls (last six months)             Mobility Comments: Mod indep with ADLs, household and limited community mobilization; intermittent use of SPC.  Does endorse at least 3 falls in recent weeks/months (fall out of bed x3) ADLs Comments: independent including driving, cooking, etc        OT Problem List:  Decreased range of motion;Decreased strength;Impaired balance (sitting and/or standing);Impaired UE functional use      OT Treatment/Interventions: Self-care/ADL training;Therapeutic exercise;Neuromuscular education;Energy conservation;DME and/or AE instruction;Therapeutic activities;Patient/family education;Balance training    OT Goals(Current goals can be found in the care plan section) Acute Rehab OT Goals OT Goal Formulation: With patient Time For Goal Achievement: 01/23/23 Potential to Achieve Goals: Good  OT Frequency: Min 1X/week       AM-PAC OT "6 Clicks" Daily Activity     Outcome Measure Help from another person eating meals?: None Help from another person taking care of personal grooming?: None Help from another person toileting, which includes using toliet, bedpan, or urinal?: A Little Help from another person bathing (including washing, rinsing, drying)?: A Little Help from another person to put on and taking off regular upper body clothing?: A Little Help from another person to put on and taking off regular lower body clothing?: A Little 6 Click Score: 20   End of Session Equipment Utilized During Treatment: Rolling walker (2 wheels);Gait belt Nurse Communication: Mobility status;Other (comment) (decreased R UE ROM, wound drainage)  Activity Tolerance: Patient tolerated treatment well Patient left: in chair;with call bell/phone within reach;with chair alarm set  OT Visit Diagnosis: Repeated falls (R29.6);Muscle weakness (generalized) (M62.81);Unsteadiness on feet (R26.81)  Time: 1610-9604 OT Time Calculation (min): 49 min Charges:  OT General Charges $OT Visit: 1 Visit OT Evaluation $OT Eval Low Complexity: 1 Low OT Treatments $Self Care/Home Management : 23-37 mins Aleenah Homen L. Manpreet Kemmer, OTR/L  01/09/23, 12:32 PM
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
## 2023-01-09 NOTE — Progress Notes (Signed)
PT Cancellation Note  Patient Details Name: Donald [name] MRN: [id number] DOB: 1945-06-25   Cancelled Treatment:    Reason Eval/Treat Not Completed: Fatigue/lethargy limiting ability to participate Patient sleeping soundly. Will re-attempt at later time/date.   Kyleeann Cremeans 01/09/2023, 3:21 PM

## 2023-01-10 ENCOUNTER — Telehealth: Payer: Self-pay

## 2023-01-10 DIAGNOSIS — Z794 Long term (current) use of insulin: Secondary | ICD-10-CM | POA: Diagnosis not present

## 2023-01-10 DIAGNOSIS — E1165 Type 2 diabetes mellitus with hyperglycemia: Secondary | ICD-10-CM | POA: Diagnosis not present

## 2023-01-10 DIAGNOSIS — I480 Paroxysmal atrial fibrillation: Secondary | ICD-10-CM | POA: Diagnosis not present

## 2023-01-10 DIAGNOSIS — I10 Essential (primary) hypertension: Secondary | ICD-10-CM

## 2023-01-10 DIAGNOSIS — L03317 Cellulitis of buttock: Secondary | ICD-10-CM | POA: Diagnosis not present

## 2023-01-10 DIAGNOSIS — Z22322 Carrier or suspected carrier of Methicillin resistant Staphylococcus aureus: Secondary | ICD-10-CM | POA: Diagnosis not present

## 2023-01-10 DIAGNOSIS — I5032 Chronic diastolic (congestive) heart failure: Secondary | ICD-10-CM | POA: Diagnosis not present

## 2023-01-10 DIAGNOSIS — S31000D Unspecified open wound of lower back and pelvis without penetration into retroperitoneum, subsequent encounter: Secondary | ICD-10-CM | POA: Diagnosis not present

## 2023-01-10 LAB — GLUCOSE, CAPILLARY
Glucose-Capillary: 166 mg/dL — ABNORMAL HIGH (ref 70–99)
Glucose-Capillary: 183 mg/dL — ABNORMAL HIGH (ref 70–99)

## 2023-01-10 LAB — URINALYSIS, COMPLETE (UACMP) WITH MICROSCOPIC
Bacteria, UA: NONE SEEN
Bilirubin Urine: NEGATIVE
Glucose, UA: 50 mg/dL — AB
Ketones, ur: NEGATIVE mg/dL
Leukocytes,Ua: NEGATIVE
Nitrite: NEGATIVE
Protein, ur: 30 mg/dL — AB
Specific Gravity, Urine: 1.016 (ref 1.005–1.030)
pH: 5 (ref 5.0–8.0)

## 2023-01-10 LAB — AEROBIC CULTURE W GRAM STAIN (SUPERFICIAL SPECIMEN)
Gram Stain: NONE SEEN
Gram Stain: NONE SEEN

## 2023-01-10 LAB — CREATININE, SERUM
Creatinine, Ser: 1.64 mg/dL — ABNORMAL HIGH (ref 0.61–1.24)
GFR, Estimated: 43 mL/min — ABNORMAL LOW (ref >60)

## 2023-01-10 MED ORDER — DOXYCYCLINE HYCLATE 100 MG PO TABS: 100.0000 mg | ORAL_TABLET | Freq: Two times a day (BID) | ORAL | 0 refills | Status: DC

## 2023-01-10 MED ORDER — DOXAZOSIN MESYLATE 1 MG PO TABS
1.0000 mg | ORAL_TABLET | Freq: Two times a day (BID) | ORAL | Status: DC
Start: 2023-01-10 — End: 2023-01-10
  Administered 2023-01-10: 1 mg via ORAL
  Filled 2023-01-10: qty 1

## 2023-01-10 MED ORDER — APIXABAN 5 MG PO TABS: 5.0000 mg | ORAL_TABLET | Freq: Two times a day (BID) | ORAL | 0 refills | Status: DC

## 2023-01-10 MED ORDER — ISOSORBIDE MONONITRATE ER 30 MG PO TB24
30.0000 mg | ORAL_TABLET | Freq: Every day | ORAL | Status: DC
  Administered 2023-01-10: 30 mg via ORAL
  Filled 2023-01-10: qty 1
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
## 2023-01-10 NOTE — Telephone Encounter (Signed)
With no contact number not sure we can do anything until they call back?

## 2023-01-10 NOTE — Plan of Care (Signed)
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
## 2023-01-10 NOTE — Telephone Encounter (Signed)
Will await call back
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
## 2023-01-10 NOTE — TOC Transition Note (Signed)
Transition of Care [facility]) - Discharge Note   Patient Details  Name: Donald [name] MRN: [id number] Date of Birth: 1945-04-11  [facility]) CM/SW Contact:  Margarito Liner, LCSW Phone Number: 01/10/2023, 10:55 AM   Clinical Narrative:   Patient has orders to discharge home today. Received call from [facility] liaison stating that they received a referral for patient prior to admission and had already spoken with patient/family. CSW discussed with patient and he is agreeable to moving forward with [facility]. Liaison is aware of patient's therapy and wound care needs. Patient requested that RN wait to change his dressing until his wife gets here so she can learn how to do it. DME recommendation for RW. He brought one to the hospital but it was accidentally taken by a staff member. CSW ordered a new one through [employer]. No further concerns. CSW signing off.  Final next level of care: Home w Home Health Services Barriers to Discharge: Barriers Resolved   Patient Goals and CMS Choice     Choice offered to / list presented to : Patient      Discharge Placement                Patient to be transferred to facility by: Wife   Patient and family notified of of transfer: 01/10/23  Discharge Plan and Services Additional resources added to the After Visit Summary for                  DME Arranged: Walker rolling DME Agency: [employer] Date DME Agency Contacted: 01/10/23   Representative spoke with at DME Agency: Yvone Neu HH Arranged: RN, PT, OT [facility] Agency: [facility] Date [facility] Agency Contacted: 01/10/23   Representative spoke with at [facility] Agency: Alfonzo Beers  Social Drivers of Health (SDOH) Interventions SDOH Screenings   Food Insecurity: No Food Insecurity (01/07/2023)  Housing: Low Risk  (01/07/2023)  Transportation Needs: No Transportation Needs (01/07/2023)  Utilities: Not At Risk (01/07/2023)  Alcohol Screen: Low Risk  (04/20/2019)  Depression (PHQ2-9):  Low Risk  (01/01/2023)  Financial Resource Strain: Medium Risk (02/07/2022)  Physical Activity: Inactive (04/20/2019)  Stress: No Stress Concern Present (04/20/2019)  Tobacco Use: Low Risk  (01/06/2023)     Readmission Risk Interventions     No data to display
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
## 2023-01-10 NOTE — Plan of Care (Signed)
  Problem: Education: Goal: Ability to describe self-care measures that may prevent or decrease complications (Diabetes Survival Skills Education) will improve Outcome: Adequate for Discharge Goal: Individualized Educational Video(s) Outcome: Adequate for Discharge   Problem: Coping: Goal: Ability to adjust to condition or change in health will improve Outcome: Adequate for Discharge   Problem: Fluid Volume: Goal: Ability to maintain a balanced intake and output will improve Outcome: Adequate for Discharge   Problem: Health Behavior/Discharge Planning: Goal: Ability to identify and utilize available resources and services will improve Outcome: Adequate for Discharge Goal: Ability to manage health-related needs will improve Outcome: Adequate for Discharge   Problem: Metabolic: Goal: Ability to maintain appropriate glucose levels will improve Outcome: Adequate for Discharge   Problem: Nutritional: Goal: Maintenance of adequate nutrition will improve Outcome: Adequate for Discharge Goal: Progress toward achieving an optimal weight will improve Outcome: Adequate for Discharge   Problem: Skin Integrity: Goal: Risk for impaired skin integrity will decrease Outcome: Adequate for Discharge   Problem: Tissue Perfusion: Goal: Adequacy of tissue perfusion will improve Outcome: Adequate for Discharge   Problem: Fluid Volume: Goal: Hemodynamic stability will improve Outcome: Adequate for Discharge   Problem: Clinical Measurements: Goal: Diagnostic test results will improve Outcome: Adequate for Discharge Goal: Signs and symptoms of infection will decrease Outcome: Adequate for Discharge   Problem: Respiratory: Goal: Ability to maintain adequate ventilation will improve Outcome: Adequate for Discharge   Problem: Education: Goal: Knowledge of General Education information will improve Description: Including pain rating scale, medication(s)/side effects and non-pharmacologic  comfort measures Outcome: Adequate for Discharge   Problem: Health Behavior/Discharge Planning: Goal: Ability to manage health-related needs will improve Outcome: Adequate for Discharge   Problem: Clinical Measurements: Goal: Ability to maintain clinical measurements within normal limits will improve Outcome: Adequate for Discharge Goal: Will remain free from infection Outcome: Adequate for Discharge Goal: Diagnostic test results will improve Outcome: Adequate for Discharge Goal: Respiratory complications will improve Outcome: Adequate for Discharge Goal: Cardiovascular complication will be avoided Outcome: Adequate for Discharge   Problem: Activity: Goal: Risk for activity intolerance will decrease Outcome: Adequate for Discharge   Problem: Nutrition: Goal: Adequate nutrition will be maintained Outcome: Adequate for Discharge   Problem: Coping: Goal: Level of anxiety will decrease Outcome: Adequate for Discharge   Problem: Elimination: Goal: Will not experience complications related to bowel motility Outcome: Adequate for Discharge Goal: Will not experience complications related to urinary retention Outcome: Adequate for Discharge   Problem: Pain Management: Goal: General experience of comfort will improve Outcome: Adequate for Discharge   Problem: Safety: Goal: Ability to remain free from injury will improve Outcome: Adequate for Discharge   Problem: Skin Integrity: Goal: Risk for impaired skin integrity will decrease Outcome: Adequate for Discharge

## 2023-01-10 NOTE — Telephone Encounter (Signed)
Copied from CRM [id number]. Topic: General - Other >> Jan 10, 2023 10:43 AM Truddie Crumble wrote: Reason for CRM: Dawn from [employer] was calling to verify the pt in regards to medical supplies. Phone was disconnected so I could not get CB#

## 2023-01-10 NOTE — Plan of Care (Signed)
IV removed, discharge instructions reviewed with patient and wife.  I showed wife how to change dressing and will provide supplies for several dressing changes until home health arrives.  Patient will be discharged with wife to home.

## 2023-01-11 DIAGNOSIS — E1122 Type 2 diabetes mellitus with diabetic chronic kidney disease: Secondary | ICD-10-CM | POA: Diagnosis not present

## 2023-01-11 DIAGNOSIS — E785 Hyperlipidemia, unspecified: Secondary | ICD-10-CM | POA: Diagnosis not present

## 2023-01-11 DIAGNOSIS — S50311D Abrasion of right elbow, subsequent encounter: Secondary | ICD-10-CM | POA: Diagnosis not present

## 2023-01-11 DIAGNOSIS — T2124XA Burn of second degree of lower back, initial encounter: Secondary | ICD-10-CM | POA: Diagnosis not present

## 2023-01-11 DIAGNOSIS — I5040 Unspecified combined systolic (congestive) and diastolic (congestive) heart failure: Secondary | ICD-10-CM | POA: Diagnosis not present

## 2023-01-11 DIAGNOSIS — N183 Chronic kidney disease, stage 3 unspecified: Secondary | ICD-10-CM | POA: Diagnosis not present

## 2023-01-11 DIAGNOSIS — S50312D Abrasion of left elbow, subsequent encounter: Secondary | ICD-10-CM | POA: Diagnosis not present

## 2023-01-11 DIAGNOSIS — I482 Chronic atrial fibrillation, unspecified: Secondary | ICD-10-CM | POA: Diagnosis not present

## 2023-01-11 DIAGNOSIS — E1165 Type 2 diabetes mellitus with hyperglycemia: Secondary | ICD-10-CM | POA: Diagnosis not present

## 2023-01-11 DIAGNOSIS — K219 Gastro-esophageal reflux disease without esophagitis: Secondary | ICD-10-CM | POA: Diagnosis not present

## 2023-01-11 DIAGNOSIS — M47816 Spondylosis without myelopathy or radiculopathy, lumbar region: Secondary | ICD-10-CM | POA: Diagnosis not present

## 2023-01-11 DIAGNOSIS — I130 Hypertensive heart and chronic kidney disease with heart failure and stage 1 through stage 4 chronic kidney disease, or unspecified chronic kidney disease: Secondary | ICD-10-CM | POA: Diagnosis not present

## 2023-01-11 DIAGNOSIS — M1990 Unspecified osteoarthritis, unspecified site: Secondary | ICD-10-CM | POA: Diagnosis not present

## 2023-01-11 DIAGNOSIS — E871 Hypo-osmolality and hyponatremia: Secondary | ICD-10-CM | POA: Diagnosis not present

## 2023-01-11 DIAGNOSIS — I2510 Atherosclerotic heart disease of native coronary artery without angina pectoris: Secondary | ICD-10-CM | POA: Diagnosis not present

## 2023-01-11 DIAGNOSIS — B9562 Methicillin resistant Staphylococcus aureus infection as the cause of diseases classified elsewhere: Secondary | ICD-10-CM | POA: Diagnosis not present

## 2023-01-11 DIAGNOSIS — K5900 Constipation, unspecified: Secondary | ICD-10-CM | POA: Diagnosis not present

## 2023-01-11 DIAGNOSIS — I700 Atherosclerosis of aorta: Secondary | ICD-10-CM | POA: Diagnosis not present

## 2023-01-11 DIAGNOSIS — B955 Unspecified streptococcus as the cause of diseases classified elsewhere: Secondary | ICD-10-CM | POA: Diagnosis not present

## 2023-01-11 DIAGNOSIS — H269 Unspecified cataract: Secondary | ICD-10-CM | POA: Diagnosis not present

## 2023-01-11 DIAGNOSIS — J449 Chronic obstructive pulmonary disease, unspecified: Secondary | ICD-10-CM | POA: Diagnosis not present

## 2023-01-11 DIAGNOSIS — S80211D Abrasion, right knee, subsequent encounter: Secondary | ICD-10-CM | POA: Diagnosis not present

## 2023-01-11 DIAGNOSIS — E1169 Type 2 diabetes mellitus with other specified complication: Secondary | ICD-10-CM | POA: Diagnosis not present

## 2023-01-11 DIAGNOSIS — L03317 Cellulitis of buttock: Secondary | ICD-10-CM | POA: Diagnosis not present

## 2023-01-11 DIAGNOSIS — S80212D Abrasion, left knee, subsequent encounter: Secondary | ICD-10-CM | POA: Diagnosis not present

## 2023-01-11 LAB — CULTURE, BLOOD (ROUTINE X 2)
Culture: NO GROWTH
Culture: NO GROWTH
Special Requests: ADEQUATE
Special Requests: ADEQUATE

## 2023-01-13 ENCOUNTER — Telehealth: Payer: Self-pay

## 2023-01-13 DIAGNOSIS — B955 Unspecified streptococcus as the cause of diseases classified elsewhere: Secondary | ICD-10-CM | POA: Diagnosis not present

## 2023-01-13 DIAGNOSIS — B9562 Methicillin resistant Staphylococcus aureus infection as the cause of diseases classified elsewhere: Secondary | ICD-10-CM | POA: Diagnosis not present

## 2023-01-13 DIAGNOSIS — I130 Hypertensive heart and chronic kidney disease with heart failure and stage 1 through stage 4 chronic kidney disease, or unspecified chronic kidney disease: Secondary | ICD-10-CM | POA: Diagnosis not present

## 2023-01-13 DIAGNOSIS — L03317 Cellulitis of buttock: Secondary | ICD-10-CM | POA: Diagnosis not present

## 2023-01-13 DIAGNOSIS — T2124XA Burn of second degree of lower back, initial encounter: Secondary | ICD-10-CM | POA: Diagnosis not present

## 2023-01-13 DIAGNOSIS — E1165 Type 2 diabetes mellitus with hyperglycemia: Secondary | ICD-10-CM | POA: Diagnosis not present

## 2023-01-13 NOTE — Transitions of Care (Post Inpatient/ED Visit) (Signed)
   01/13/2023  Name: Donald [name] MRN: [id number] DOB: Jul 11, 1945  Today's TOC FU Call Status: Today's TOC FU Call Status:: Unsuccessful Call (1st Attempt) Unsuccessful Call (1st Attempt) Date: 01/13/23  Attempted to reach the patient regarding the most recent Inpatient/ED visit.  Follow Up Plan: Additional outreach attempts will be made to reach the patient to complete the Transitions of Care (Post Inpatient/ED visit) call.   Deidre Ala, RN Medical illustrator [facility] [phone]

## 2023-01-14 ENCOUNTER — Telehealth: Payer: Self-pay | Admitting: *Deleted

## 2023-01-14 DIAGNOSIS — B955 Unspecified streptococcus as the cause of diseases classified elsewhere: Secondary | ICD-10-CM | POA: Diagnosis not present

## 2023-01-14 DIAGNOSIS — I130 Hypertensive heart and chronic kidney disease with heart failure and stage 1 through stage 4 chronic kidney disease, or unspecified chronic kidney disease: Secondary | ICD-10-CM | POA: Diagnosis not present

## 2023-01-14 DIAGNOSIS — L03317 Cellulitis of buttock: Secondary | ICD-10-CM | POA: Diagnosis not present

## 2023-01-14 DIAGNOSIS — T2124XA Burn of second degree of lower back, initial encounter: Secondary | ICD-10-CM | POA: Diagnosis not present

## 2023-01-14 DIAGNOSIS — B9562 Methicillin resistant Staphylococcus aureus infection as the cause of diseases classified elsewhere: Secondary | ICD-10-CM | POA: Diagnosis not present

## 2023-01-14 DIAGNOSIS — E1165 Type 2 diabetes mellitus with hyperglycemia: Secondary | ICD-10-CM | POA: Diagnosis not present

## 2023-01-14 NOTE — Transitions of Care (Post Inpatient/ED Visit) (Signed)
   01/14/2023  Name: Donald [name] MRN: [id number] DOB: 1946/01/02  Today's TOC FU Call Status: Today's TOC FU Call Status:: Unsuccessful Call (2nd Attempt) Unsuccessful Call (2nd Attempt) Date: 01/14/23  Attempted to reach the patient regarding the most recent Inpatient/ED visit.  Follow Up Plan: Additional outreach attempts will be made to reach the patient to complete the Transitions of Care (Post Inpatient/ED visit) call.   Cathlean Headland BSN RN Population Health- Transition of Care Team.  [facility] [phone]

## 2023-01-14 NOTE — Transitions of Care (Post Inpatient/ED Visit) (Signed)
 01/14/2023  Name: Donald [name] MRN: [id number] DOB: October 25, 1945  Today's TOC FU Call Status: Today's TOC FU Call Status:: Successful TOC FU Call Completed TOC FU Call Complete Date: 01/14/23 Patient's Name and Date of Birth confirmed.  Transition Care Management Follow-up Telephone Call Date of Discharge: 01/10/23 Discharge Facility: [facility]) Type of Discharge: Inpatient Admission Primary Inpatient Discharge Diagnosis:: sacral wound, subsequent encounter How have you been since you were released from the hospital?: Better Any questions or concerns?: No  Items Reviewed: Did you receive and understand the discharge instructions provided?: Yes Medications obtained,verified, and reconciled?: Yes (Medications Reviewed) Any new allergies since your discharge?: No Dietary orders reviewed?: No Do you have support at home?: Yes People in Home: spouse Name of Support/Comfort Primary Source: [name]  Medications Reviewed Today: Medications Reviewed Today     Reviewed by Kennieth Cathlean DEL, RN (Case Manager) on 01/14/23 at 1418  Med List Status: <None>   Medication Order Taking? Sig Documenting Provider Last Dose Status Informant  albuterol  (VENTOLIN  HFA) 108 (90 Base) MCG/ACT inhaler [id number] Yes Inhale 2 puffs into the lungs every 6 (six) hours as needed for wheezing or shortness of breath. Rilla Baller, MD Taking Active Self  allopurinol  (ZYLOPRIM ) 300 MG tablet [id number] Yes TAKE ONE TABLET BY MOUTH EVERY DAY Rilla Baller, MD Taking Active Self  ALPRAZolam  (XANAX ) 1 MG tablet [id number] Yes TAKE 1 TABLET BY MOUTH AT BEDTIME IF NEEDED FOR SLEEP.  Patient taking differently: Take 0.5 mg by mouth at bedtime.   Rilla Baller, MD Taking Active Self           Med Note GROVER BURNARD GORMAN Pablo Jan 06, 2023  8:25 PM) prn  apixaban  (ELIQUIS ) 5 MG TABS tablet [id number] Yes Take 1 tablet (5 mg total) by mouth 2 (two) times daily. Laurita Pillion, MD Taking Active   aspirin  81 MG EC tablet [id number] Yes Take 81 mg by mouth daily. Swallow whole. [provider] Taking Active Self  Cholecalciferol (VITAMIN D3) 25 MCG (1000 UT) CAPS [id number] Yes Take 1 capsule (1,000 Units total) by mouth daily. Rilla Baller, MD Taking Active Self  colchicine  0.6 MG tablet [id number] Yes TAKE 1 TABLET BY MOUTH DAILY AS NEEDED FOR GOUT GLARE. ON FIRST DAY OF GOUT FLARE,MAY TAKE 2 TABLETS AT ONCE AS DIRECTED Rilla Baller, MD Taking Active Self           Med Note GROVER BURNARD GORMAN Pablo Jan 06, 2023  8:26 PM) prn  Continuous Glucose Sensor (FREESTYLE LIBRE 2 SENSOR) [place] [id number] Yes 1 Device by Does not apply route every 14 (fourteen) days. Shamleffer, Donell Cardinal, MD Taking Active Self  cyanocobalamin  (VITAMIN B12) 1000 MCG/ML injection [id number] Yes INJECT 1ML INTRAMUSCULARLY EVERY 30 DAYS Rilla Baller, MD Taking Active Self  cyclobenzaprine  (FLEXERIL ) 10 MG tablet [id number] Yes Take 1 tablet (10 mg total) by mouth 3 (three) times daily as needed for muscle spasms. Jimmy Charlie FERNS, MD Taking Active Self  doxazosin  (CARDURA ) 1 MG tablet [id number] Yes TAKE ONE TABLET BY MOUTH TWICE DAILY Gollan, Timothy J, MD Taking Active Self  doxycycline  (VIBRA -TABS) 100 MG tablet [id number] Yes Take 1 tablet (100 mg total) by mouth 2 (two) times daily. Laurita Pillion, MD Taking Active   fluticasone  (FLONASE ) 50 MCG/ACT nasal spray [id number] Yes Place 2 sprays into both nostrils daily. Avram Barnie NOVAK, FNP Taking Active Self           Med Note GROVER, KELLY  S   Mon Jan 06, 2023  8:27 PM) prn  furosemide  (LASIX ) 80 MG tablet [id number] Yes TAKE 1 TABLET BY MOUTH EVERY DAY. MAY TAKE AN EXTRA PILL FOR SWELLING. Gollan, Timothy J, MD Taking Active Self  gabapentin  (NEURONTIN ) 600 MG tablet [id number] Yes TAKE TWO TABLETS AT BEDTIME Rilla Baller, MD Taking Active Self  glucose blood (GE100 BLOOD GLUCOSE TEST) test strip [id number] Yes  TEST FOUR TIMES DAILY Shamleffer, Ibtehal Jaralla, MD Taking Active Self  HYDROcodone -acetaminophen  (NORCO) 10-325 MG tablet [id number] Yes Take 1 tablet by mouth every 6 (six) hours as needed for moderate pain ((typically once to twice daily if needed)).  Rilla Baller, MD Taking Active Self  isosorbide  mononitrate (IMDUR ) 30 MG 24 hr tablet [id number] Yes TAKE ONE TABLET BY MOUTH DAILY Gollan, Timothy J, MD Taking Active Self  losartan  (COZAAR ) 25 MG tablet [id number] Yes TAKE ONE TABLET BY MOUTH AT BEDTIME Rilla Baller, MD Taking Active Self  lovastatin  (MEVACOR ) 40 MG tablet [id number] Yes TAKE TWO TABLETS BY MOUTH AT BEDTIME Gollan, Timothy J, MD Taking Active Self  meclizine (ANTIVERT) 25 MG tablet [id number] Yes Take 25 mg by mouth 3 (three) times daily as needed for dizziness. [provider] Taking Active Self           Med Note GROVER BURNARD GORMAN Pablo Jan 06, 2023  8:28 PM) prn  Naphazoline-Pheniramine (OPCON-A  OP) [id number] Yes Place 1 drop into both eyes 3 (three) times daily as needed (itchy eyes.).  [provider] Taking Active Self           Med Note GROVER BURNARD GORMAN Pablo Jan 06, 2023  8:29 PM) prn  nitroGLYCERIN  (NITROSTAT ) 0.4 MG SL tablet [id number] Yes Place 1 tablet (0.4 mg total) under the tongue every 5 (five) minutes as needed for chest pain. Gollan, Timothy J, MD Taking Active Self           Med Note GROVER BURNARD GORMAN Pablo Jan 06, 2023  8:29 PM) prn  NOVOLIN  N 100 UNIT/ML injection [id number] Yes INJECT 160-170 UNITS TOTAL INTO THE SKIN AT BEDTIME Shamleffer, Donell Cardinal, MD Taking Active Self  NOVOLIN  R 100 UNIT/ML injection [id number] Yes INJECT 140 UNITS IN THE MORNING, 140 UNITS IN THE AFTERNOON, AND 160-180 UNITS IN THE EVENING USE BEFORE A MEAL Shamleffer, Donell Cardinal, MD Taking Active Self  Sennosides-Docusate Sodium  (STOOL SOFTENER & LAXATIVE PO) [id number] Yes Take 2 tablets by mouth at bedtime. [provider] Taking  Active Self           Med Note GROVER BURNARD GORMAN Pablo Jan 06, 2023  8:31 PM) prn  silver  sulfADIAZINE  (SILVADENE ) 1 % cream [id number] Yes Apply topically 2 (two) times daily. [provider] Taking Active Self  Syringe/Needle, Disp, (SYRINGE 3CC/22GX1-1/2) 22G X 1-1/2 3 ML MISC [id number] Yes Use to administer monthly b12 shots Rilla Baller, MD Taking Active Self  Med List Note Claud Micheal ONEIDA Bishop 10/12/18 1538): cpap machine at bedtime            Home Care and Equipment/Supplies: Were Home Health Services Ordered?: Yes Name of Home Health Agency:: [facility] Has Agency set up a time to come to your home?: Yes First Home Health Visit Date: 01/14/23 Any new equipment or medical supplies ordered?: Yes Name of Medical supply agency?: [facility] Were you able to get the equipment/medical supplies?: Yes Do you have any questions related to the use of the equipment/supplies?: No  Functional  Questionnaire: Do you need assistance with bathing/showering or dressing?: Yes Do you need assistance with meal preparation?: Yes Do you need assistance with eating?: No Do you have difficulty maintaining continence: No Do you need assistance with getting out of bed/getting out of a chair/moving?: No Do you have difficulty managing or taking your medications?: No  Follow up appointments reviewed: PCP Follow-up appointment confirmed?: Yes Date of PCP follow-up appointment?: 01/20/23 Follow-up Provider: [facility] Follow-up appointment confirmed?: Yes Date of Specialist follow-up appointment?: 01/16/23 Follow-Up Specialty Provider:: [id number] Dr Jayson [phone] Alberta Fishman [id number] Dr Sam Do you need transportation to your follow-up appointment?: No Do you understand care options if your condition(s) worsen?: Yes-patient verbalized understanding  SDOH Interventions Today    Flowsheet Row Most Recent Value  SDOH Interventions   Food  Insecurity Interventions Intervention Not Indicated  Housing Interventions Intervention Not Indicated  Transportation Interventions Intervention Not Indicated, Patient Resources (Friends/Family)  Utilities Interventions Intervention Not Indicated      Interventions Today    Flowsheet Row Most Recent Value  Chronic Disease   Chronic disease during today's visit Other  [Sacral wound, subsequent encounter]  General Interventions   General Interventions Discussed/Reviewed General Interventions Discussed, General Interventions Reviewed, Doctor Visits  Doctor Visits Discussed/Reviewed Doctor Visits Discussed, Doctor Visits Reviewed  Pharmacy Interventions   Pharmacy Dicussed/Reviewed Pharmacy Topics Discussed, Pharmacy Topics Reviewed      Patient declined further outreach calls  Cathlean Headland BSN RN Population Health- Transition of Care Team.  [facility] [phone]

## 2023-01-16 ENCOUNTER — Ambulatory Visit (INDEPENDENT_AMBULATORY_CARE_PROVIDER_SITE_OTHER): Payer: HMO | Admitting: General Surgery

## 2023-01-16 ENCOUNTER — Encounter: Payer: Self-pay | Admitting: General Surgery

## 2023-01-16 ENCOUNTER — Encounter: Payer: Medicare Other | Admitting: General Surgery

## 2023-01-16 VITALS — BP 114/61 | HR 66 | Temp 98.2°F | Ht 63.0 in | Wt 234.0 lb

## 2023-01-16 DIAGNOSIS — Z09 Encounter for follow-up examination after completed treatment for conditions other than malignant neoplasm: Secondary | ICD-10-CM

## 2023-01-16 DIAGNOSIS — S31000D Unspecified open wound of lower back and pelvis without penetration into retroperitoneum, subsequent encounter: Secondary | ICD-10-CM

## 2023-01-16 DIAGNOSIS — L02818 Cutaneous abscess of other sites: Secondary | ICD-10-CM

## 2023-01-16 NOTE — Patient Instructions (Signed)
 Keep your appointment with the [facility] tomorrow. They will have more specialized dressings.  Follow up here in 2 week.  Finish all of your Antibiotics.   You may take Tylenol , 2 extra strength every 6-8 hours for pain control.   Wound Packing Wound packing usually involves placing a moistened packing material into your wound and then covering it with an outer bandage (dressing). This helps support the healing of deep tissue and tissue under the skin. It also helps prevent bleeding, infection, and further injury. Wounds are packed until deep tissues heal. The time it takes for this to happen is different for everyone. Your health care provider will show you how to pack and dress your wound. Using gloves and a clean technique is important to avoid spreading germs into your wound. Supplies needed: Soap and water. Disposable gloves. Cleansing or wetting solution, such as saline, germ-free (sterile) water, or an antiseptic solution. Clean bowl. Clean packing material, such as gauze, gauze sponges, or rolled gauze. Clean paper towels. Outer dressing. This includes the cover dressing and tape, or a dressing with an adhesive border. Cotton-tipped swabs. Small plastic bag for trash. How to pack your wound Follow your health care provider's instructions on how often you need to change dressings and pack your wound. You will likely be asked to change your dressings 1 to 2 times a day. Preparing to change the wound packing If needed, take pain medicine 30 minutes before you pack your wound as told by your health care provider. Preparing the new packing material  Clean and disinfect your work surface or countertop. Set a plastic bag on or near your work surface. Wash your hands with soap and water for at least 20 seconds before you change the dressing. If soap and water are not available, use hand sanitizer. Put a clean paper towel on the counter. Put a clean bowl on the towel. Only touch the  outside of the bowl when handling it. Pour the cleansing or wetting solution that your health care provider tells you to use into the bowl. Select and cut your packing material to fit the size of your wound. Avoid using multiple pieces of packing material. Drop it into the bowl. Cut tape strips that you will use to seal the outer dressing, if needed. Put gauze pads for cleansing and cotton-tipped swabs on the clean paper towel. Removing the old packing material and dressing Put on a set of gloves. Gently remove the old dressing and packing material. Make sure to check how the drainage looks or if there is any odor. Clean or rinse (irrigate) the wound. Remove your gloves. Put the removed items, including gloves, into the plastic bag to throw away later. Wash your hands again with soap and water for at least 20 seconds. If soap and water are not available, use hand sanitizer. Applying the new packing material and dressing  Put on a new set of gloves. Squeeze the packing material in the bowl to release the extra liquid. The packing material should be moist, but not dripping wet. Gently place the packing material into the wound. Use a cotton-tipped swab to guide it into place, filling all of the space. Do not overpack the wound bed. Dry your gloved fingertips on the paper towel. Open up your outer dressing supplies and put them on a dry part of the paper towel. Keep them from getting wet. Place the outer dressing over the packed wound. Tape the edges of the outer dressing in place.  Remove your gloves. Wash your hands again with soap and water for at least 20 seconds. If soap and water are not available, use hand sanitizer. Put the removed items, including gloves, into the plastic bag to throw away. Clean and disinfect your work surface or countertop. General tips Follow your health care provider's instructions on how much to pack the wound. At first, you may need to pack it more fully to help  stop bleeding. As the wound begins to heal inside, you will use less packing material and pack the wound loosely to allow the tissue to heal slowly from the inside out. Do not take baths, swim, or use a hot tub until your health care provider approves. Ask your health care provider if you may take showers. You may only be allowed to take sponge baths. Keep the dressing clean and dry. Follow any other instructions given by your health care provider on how to aid healing. This may include applying warm or cold compresses, raising (elevating) the affected area, or wearing a compression dressing. Check your wound site every day for signs of infection. Check for: More redness, swelling, or pain. More fluid or blood. Warmth or hardness (induration). Pus or a bad smell. Protect your wound from the sun when you are outside for the first 6 months, or for as long as told by your health care provider. Cover up the scar area or apply sunscreen that has an SPF of at least 30. Keep all follow-up visits. This is important. Contact a health care provider if: Your pain is not controlled with pain medicine. You have more drainage, redness, swelling, or pain at your wound site. You have new rash, warmth, or induration around the wound. You have a fever or chills. Your wound becomes larger or deeper. Get help right away if: The tissue inside your wound changes color from pink to white, yellow, or black. You notice a bad smell or pus coming from the wound site. You are having trouble packing your wound. Your wound is bleeding, and the bleeding does not stop with gentle pressure. These symptoms may represent a serious problem that is an emergency. Do not wait to see if the symptoms will go away. Get medical help right away. Call your local emergency services (911 in the [place] Do not drive yourself to the hospital. Summary Wound packing usually involves placing a moistened packing material into your wound and then  covering it with an outer bandage (dressing). Follow your health care provider's instructions on how often you need to change dressings and pack your wound. You will likely be asked to change dressings 1 to 2 times a day. When packing your wound, it is important to use gloves to avoid spreading germs into the wound. Check your wound site every day for signs of infection. This information is not intended to replace advice given to you by your health care provider. Make sure you discuss any questions you have with your health care provider. Document Revised: 05/09/2020 Document Reviewed: 05/09/2020 Elsevier Patient Education  2024 [employer].

## 2023-01-17 ENCOUNTER — Encounter: Payer: PPO | Attending: Physician Assistant | Admitting: Physician Assistant

## 2023-01-17 ENCOUNTER — Ambulatory Visit: Payer: Medicare Other | Admitting: Family Medicine

## 2023-01-17 DIAGNOSIS — X19XXXA Contact with other heat and hot substances, initial encounter: Secondary | ICD-10-CM | POA: Insufficient documentation

## 2023-01-17 DIAGNOSIS — E1151 Type 2 diabetes mellitus with diabetic peripheral angiopathy without gangrene: Secondary | ICD-10-CM | POA: Diagnosis not present

## 2023-01-17 DIAGNOSIS — I5040 Unspecified combined systolic (congestive) and diastolic (congestive) heart failure: Secondary | ICD-10-CM | POA: Diagnosis not present

## 2023-01-17 DIAGNOSIS — E1122 Type 2 diabetes mellitus with diabetic chronic kidney disease: Secondary | ICD-10-CM | POA: Insufficient documentation

## 2023-01-17 DIAGNOSIS — I2510 Atherosclerotic heart disease of native coronary artery without angina pectoris: Secondary | ICD-10-CM | POA: Insufficient documentation

## 2023-01-17 DIAGNOSIS — T2135XA Burn of third degree of buttock, initial encounter: Secondary | ICD-10-CM | POA: Diagnosis not present

## 2023-01-17 DIAGNOSIS — I480 Paroxysmal atrial fibrillation: Secondary | ICD-10-CM | POA: Insufficient documentation

## 2023-01-17 DIAGNOSIS — Z7901 Long term (current) use of anticoagulants: Secondary | ICD-10-CM | POA: Insufficient documentation

## 2023-01-17 DIAGNOSIS — I130 Hypertensive heart and chronic kidney disease with heart failure and stage 1 through stage 4 chronic kidney disease, or unspecified chronic kidney disease: Secondary | ICD-10-CM | POA: Insufficient documentation

## 2023-01-17 DIAGNOSIS — B955 Unspecified streptococcus as the cause of diseases classified elsewhere: Secondary | ICD-10-CM | POA: Diagnosis not present

## 2023-01-17 DIAGNOSIS — G4730 Sleep apnea, unspecified: Secondary | ICD-10-CM | POA: Insufficient documentation

## 2023-01-17 DIAGNOSIS — T2134XA Burn of third degree of lower back, initial encounter: Secondary | ICD-10-CM | POA: Diagnosis not present

## 2023-01-17 DIAGNOSIS — E11622 Type 2 diabetes mellitus with other skin ulcer: Secondary | ICD-10-CM | POA: Insufficient documentation

## 2023-01-17 DIAGNOSIS — I509 Heart failure, unspecified: Secondary | ICD-10-CM | POA: Insufficient documentation

## 2023-01-17 DIAGNOSIS — N183 Chronic kidney disease, stage 3 unspecified: Secondary | ICD-10-CM | POA: Diagnosis not present

## 2023-01-17 DIAGNOSIS — T2124XA Burn of second degree of lower back, initial encounter: Secondary | ICD-10-CM | POA: Diagnosis not present

## 2023-01-17 DIAGNOSIS — B9562 Methicillin resistant Staphylococcus aureus infection as the cause of diseases classified elsewhere: Secondary | ICD-10-CM | POA: Diagnosis not present

## 2023-01-17 DIAGNOSIS — S50312D Abrasion of left elbow, subsequent encounter: Secondary | ICD-10-CM | POA: Diagnosis not present

## 2023-01-17 DIAGNOSIS — E1165 Type 2 diabetes mellitus with hyperglycemia: Secondary | ICD-10-CM | POA: Diagnosis not present

## 2023-01-17 DIAGNOSIS — S80211D Abrasion, right knee, subsequent encounter: Secondary | ICD-10-CM | POA: Diagnosis not present

## 2023-01-17 DIAGNOSIS — L03317 Cellulitis of buttock: Secondary | ICD-10-CM | POA: Diagnosis not present

## 2023-01-17 DIAGNOSIS — S50311D Abrasion of right elbow, subsequent encounter: Secondary | ICD-10-CM | POA: Diagnosis not present

## 2023-01-17 NOTE — Progress Notes (Addendum)
 HARSH, TRULOCK ([id number]) [phone].pdf Page 1 of 9 Visit Report for 01/17/2023 Allergy List Details Patient Name: Date of Service: Donald [name] RD L. 01/17/2023 10:00 A M Medical Record Number: [id number] Patient Account Number: [id number] Date of Birth/Sex: Treating RN: [birth year] (78 y.o. NETTY [name] Primary Care Shariah Assad: Rilla Baller Other Clinician: Referring Deshaun Schou: Treating Jahaziel Francois/Extender: Bethena Andre ENDOW, SA MUEL Weeks in Treatment: 0 Allergies Active Allergies codeine  pioglitazone Allergy Notes Electronic Signature(s) Signed: 01/17/2023 12:06:18 PM By: [name] Entered By: [name] on 01/17/2023 07:21:17 -------------------------------------------------------------------------------- Arrival Information Details Patient Name: Date of Service: Donald [name], EDWA RD L. 01/17/2023 10:00 A M Medical Record Number: [id number] Patient Account Number: [id number] Date of Birth/Sex: Treating RN: Jun 11, 1945 (78 y.o. NETTY [name] Primary Care Rishika Mccollom: Rilla Baller Other Clinician: Referring Arrin Ishler: Treating Millenia Waldvogel/Extender: Bethena Andre ENDOW, SA MUEL Weeks in Treatment: 0 Visit Information Patient Arrived: Cane Arrival Time: 10:00 Accompanied By: family Transfer Assistance: None Patient Identification Verified: Yes Secondary Verification Process Completed: Yes Patient Has Alerts: Yes Patient Alerts: Patient on Blood Thinner Electronic Signature(s) Signed: 01/20/2023 8:22:48 AM By: [name] Previous Signature: 01/17/2023 12:06:18 PM Version By: [name] ALLANA, [name] ([id number]) [id number] Page 2 of 9 Previous Signature: 01/17/2023 12:06:18 PM Version By: [name] Entered By: [name] on 01/20/2023 05:22:48 -------------------------------------------------------------------------------- Clinic Level of Care Assessment Details Patient Name: Date of  Service: Donald [name], [place] RD L. 01/17/2023 10:00 A M Medical Record Number: [id number] Patient Account Number: [id number] Date of Birth/Sex: Treating RN: [birth year] (78 y.o. NETTY [name] Primary Care Kasir Hallenbeck: Rilla Baller Other Clinician: Referring Elleana Stillson: Treating Luanna Weesner/Extender: Bethena Andre ENDOW, SA MUEL Weeks in Treatment: 0 Clinic Level of Care Assessment Items TOOL 2 Quantity Score []  - 0 Use when only an EandM is performed on the INITIAL visit ASSESSMENTS - Nursing Assessment / Reassessment X- 1 20 General Physical Exam (combine w/ comprehensive assessment (listed just below) when performed on new pt. evals) X- 1 25 Comprehensive Assessment (HX, ROS, Risk Assessments, Wounds Hx, etc.) ASSESSMENTS - Wound and Skin A ssessment / Reassessment X - Simple Wound Assessment / Reassessment - one wound 1 5 []  - 0 Complex Wound Assessment / Reassessment - multiple wounds []  - 0 Dermatologic / Skin Assessment (not related to wound area) ASSESSMENTS - Ostomy and/or Continence Assessment and Care []  - 0 Incontinence Assessment and Management []  - 0 Ostomy Care Assessment and Management (repouching, etc.) PROCESS - Coordination of Care X - Simple Patient / Family Education for ongoing care 1 15 []  - 0 Complex (extensive) Patient / Family Education for ongoing care X- 1 10 Staff obtains Chiropractor, Records, T Results / Process Orders est []  - 0 Staff telephones HHA, Nursing Homes / Clarify orders / etc []  - 0 Routine Transfer to another Facility (non-emergent condition) []  - 0 Routine Hospital Admission (non-emergent condition) []  - 0 New Admissions / Manufacturing Engineer / Ordering NPWT Apligraf, etc. , []  - 0 Emergency Hospital Admission (emergent condition) X- 1 10 Simple Discharge Coordination []  - 0 Complex (extensive) Discharge Coordination PROCESS - Special Needs []  - 0 Pediatric / Minor Patient Management []  - 0 Isolation Patient Management []   - 0 Hearing / Language / Visual special needs []  - 0 Assessment of Community assistance (transportation, D/C planning, etc.) []  - 0 Additional assistance / Altered mentation []  - 0 Support Surface(s) Assessment (bed, cushion, seat, etc.) Bethards, Issa L ([id number]) [phone].pdf Page 3 of 9 INTERVENTIONS - Wound Cleansing / Measurement X- 1 5  Wound Imaging (photographs - any number of wounds) []  - 0 Wound Tracing (instead of photographs) X- 1 5 Simple Wound Measurement - one wound []  - 0 Complex Wound Measurement - multiple wounds X- 1 5 Simple Wound Cleansing - one wound []  - 0 Complex Wound Cleansing - multiple wounds INTERVENTIONS - Wound Dressings []  - 0 Small Wound Dressing one or multiple wounds X- 1 15 Medium Wound Dressing one or multiple wounds []  - 0 Large Wound Dressing one or multiple wounds []  - 0 Application of Medications - injection INTERVENTIONS - Miscellaneous []  - 0 External ear exam []  - 0 Specimen Collection (cultures, biopsies, blood, body fluids, etc.) []  - 0 Specimen(s) / Culture(s) sent or taken to Lab for analysis []  - 0 Patient Transfer (multiple staff / Nurse, Adult / Similar devices) []  - 0 Simple Staple / Suture removal (25 or less) []  - 0 Complex Staple / Suture removal (26 or more) []  - 0 Hypo / Hyperglycemic Management (close monitor of Blood Glucose) []  - 0 Ankle / Brachial Index (ABI) - do not check if billed separately Has the patient been seen at the hospital within the last three years: Yes Total Score: 115 Level Of Care: New/Established - Level 3 Electronic Signature(s) Signed: 01/17/2023 12:06:18 PM By: [name] Entered By: [name] on 01/17/2023 08:35:53 -------------------------------------------------------------------------------- Encounter Discharge Information Details Patient Name: Date of Service: Donald SAILOR, EDWA RD L. 01/17/2023 10:00 A M Medical Record Number:  [id number] Patient Account Number: [id number] Date of Birth/Sex: Treating RN: Mar 28, 1945 (78 y.o. NETTY [name] Primary Care Jozee Hammer: Rilla Baller Other Clinician: Referring Preslie Depasquale: Treating Mackenzy Eisenberg/Extender: Bethena Andre ENDOW, SA MUEL Weeks in Treatment: 0 Encounter Discharge Information Items Discharge Condition: Stable Ambulatory Status: Cane Discharge Destination: Home Transportation: Private Auto Accompanied By: family Schedule Follow-up Appointment: Yes Clinical Summary of Care: DARBY, SHADWICK ([id number]) [id number] Page 4 of 9 Electronic Signature(s) Signed: 01/17/2023 12:06:18 PM By: [name] Entered By: [name] on 01/17/2023 08:38:23 -------------------------------------------------------------------------------- Lower Extremity Assessment Details Patient Name: Date of Service: Donald SAILOR, EDWA RD L. 01/17/2023 10:00 A M Medical Record Number: [id number] Patient Account Number: [id number] Date of Birth/Sex: Treating RN: 1945/12/05 (78 y.o. NETTY [name] Primary Care Kem Hensen: Rilla Baller Other Clinician: Referring Ariza Evans: Treating Britnee Mcdevitt/Extender: Bethena Andre ENDOW, SA MUEL Weeks in Treatment: 0 Electronic Signature(s) Signed: 01/17/2023 12:06:18 PM By: [name] Entered By: [name] on 01/17/2023 07:20:57 -------------------------------------------------------------------------------- Multi Wound Chart Details Patient Name: Date of Service: Donald SAILOR, EDWA RD L. 01/17/2023 10:00 A M Medical Record Number: [id number] Patient Account Number: [id number] Date of Birth/Sex: Treating RN: [birth year] (78 y.o. NETTY [name] Primary Care Dajia Gunnels: Rilla Baller Other Clinician: Referring Shelagh Rayman: Treating Saron Vanorman/Extender: Bethena Andre ENDOW, SA MUEL Weeks in Treatment: 0 Vital Signs Height(in): 65 Pulse(bpm): 65 Weight(lbs): 233 Blood Pressure(mmHg): 173/63 Body Mass  Index(BMI): 38.8 Temperature(F): 97.7 Respiratory Rate(breaths/min): 18 [1:Photos:] [N/A:N/A] Midline Sacrum N/A N/A Wound Location: Thermal Burn N/A N/A Wounding Event: MYCHAEL, SMOCK ([id number]) [phone].pdf Page 5 of 9 4th degree Burn N/A N/A Primary Etiology: Cataracts, Anemia, Asthma, Sleep N/A N/A Comorbid History: Apnea, Arrhythmia, Congestive Heart Failure, Coronary Artery Disease, Deep Vein Thrombosis, Hypertension, Myocardial Infarction, Peripheral Arterial Disease, Type II Diabetes, History of Burn, Gout, Neuropathy 12/23/2022 N/A N/A Date Acquired: 0 N/A N/A Weeks of Treatment: Open N/A N/A Wound Status: No N/A N/A Wound Recurrence: 4.2x3x2.3 N/A N/A Measurements L x W x D (cm) 9.896 N/A N/A A (cm) : rea 22.761 N/A N/A Volume (cm) :  12 Starting Position 1 (o'clock): 6 Ending Position 1 (o'clock): 2.8 Maximum Distance 1 (cm): Yes N/A N/A Undermining: Full Thickness Without Exposed N/A N/A Classification: Support Structures Large N/A N/A Exudate A mount: Serosanguineous N/A N/A Exudate Type: red, brown N/A N/A Exudate Color: Medium (34-66%) N/A N/A Granulation A mount: Red, Pink N/A N/A Granulation Quality: Medium (34-66%) N/A N/A Necrotic A mount: Fat Layer (Subcutaneous Tissue): Yes N/A N/A Exposed Structures: None N/A N/A Epithelialization: Treatment Notes Electronic Signature(s) Signed: 01/17/2023 12:06:18 PM By: [name] Entered By: [name] on 01/17/2023 08:24:38 -------------------------------------------------------------------------------- Multi-Disciplinary Care Plan Details Patient Name: Date of Service: Donald SAILOR, EDWA RD L. 01/17/2023 10:00 A M Medical Record Number: [id number] Patient Account Number: [id number] Date of Birth/Sex: Treating RN: [birth year] (78 y.o. NETTY [name] Primary Care Hally Colella: Rilla Baller Other Clinician: Referring Myran Arcia: Treating  Azelia Reiger/Extender: Bethena Andre ENDOW, SA MUEL Weeks in Treatment: 0 Active Inactive Pressure Nursing Diagnoses: Knowledge deficit related to causes and risk factors for pressure ulcer development Knowledge deficit related to management of pressures ulcers Potential for impaired tissue integrity related to pressure, friction, moisture, and shear Goals: Patient will remain free from development of additional pressure ulcers Date Initiated: 01/17/2023 Target Resolution Date: 04/14/2023 Goal Status: Active Patient will remain free of pressure ulcers Date Initiated: 01/17/2023 Target Resolution Date: 04/14/2023 Goal Status: Active Patient/caregiver will verbalize risk factors for pressure ulcer development Ken, Carlson L ([id number]) [id number] Page 6 of 9 Date Initiated: 01/17/2023 Date Inactivated: 01/17/2023 Target Resolution Date: 01/17/2023 Goal Status: Met Patient/caregiver will verbalize understanding of pressure ulcer management Date Initiated: 01/17/2023 Date Inactivated: 01/17/2023 Target Resolution Date: 01/17/2023 Goal Status: Met Interventions: Assess: immobility, friction, shearing, incontinence upon admission and as needed Assess offloading mechanisms upon admission and as needed Assess potential for pressure ulcer upon admission and as needed Notes: Wound/Skin Impairment Nursing Diagnoses: Impaired tissue integrity Knowledge deficit related to ulceration/compromised skin integrity Goals: Ulcer/skin breakdown will have a volume reduction of 30% by week 4 Date Initiated: 01/17/2023 Target Resolution Date: 02/14/2023 Goal Status: Active Ulcer/skin breakdown will have a volume reduction of 50% by week 8 Date Initiated: 01/17/2023 Target Resolution Date: 03/14/2023 Goal Status: Active Ulcer/skin breakdown will have a volume reduction of 80% by week 12 Date Initiated: 01/17/2023 Target Resolution Date: 04/11/2023 Goal Status: Active Ulcer/skin breakdown  will heal within 14 weeks Date Initiated: 01/17/2023 Target Resolution Date: 04/25/2023 Goal Status: Active Interventions: Assess patient/caregiver ability to obtain necessary supplies Assess patient/caregiver ability to perform ulcer/skin care regimen upon admission and as needed Assess ulceration(s) every visit Provide education on ulcer and skin care Notes: Electronic Signature(s) Signed: 01/17/2023 12:06:18 PM By: [name] Entered By: [name] on 01/17/2023 08:37:26 -------------------------------------------------------------------------------- Pain Assessment Details Patient Name: Date of Service: Donald SAILOR, EDWA RD L. 01/17/2023 10:00 A M Medical Record Number: [id number] Patient Account Number: [id number] Date of Birth/Sex: Treating RN: [birth year] (78 y.o. NETTY [name] Primary Care Joey Lierman: Rilla Baller Other Clinician: Referring Dyon Rotert: Treating Leya Paige/Extender: Bethena Andre ENDOW, SA MUEL Weeks in Treatment: 0 Active Problems Location of Pain Severity and Description of Pain Patient Has Paino Yes Site Locations Rate the pain. CHEVIS, WEISENSEL ([id number]) [phone].pdf Page 7 of 9 Rate the pain. Current Pain Level: 3 Pain Management and Medication Current Pain Management: Electronic Signature(s) Signed: 01/17/2023 12:06:18 PM By: [name] Entered By: [name] on 01/17/2023 07:20:25 -------------------------------------------------------------------------------- Patient/Caregiver Education Details Patient Name: Date of Service: Donald SAILOR, EDWA RD L. 1/3/2025andnbsp10:00 A M Medical Record Number: [id number] Patient Account Number: [id number]  Date of Birth/Gender: Treating RN: [birth year] (78 y.o. NETTY [name] Primary Care Physician: Rilla Baller Other Clinician: Referring Physician: Treating Physician/Extender: Bethena Andre ENDOW, SA MUEL Weeks in Treatment: 0 Education Assessment Education  Provided To: Patient Education Topics Provided Pressure: Handouts: Pressure Injury: Prevention and Offloading Methods: Explain/Verbal Responses: State content correctly Welcome T [facility]-New Patient Packet: o Handouts: [facility] P[facility] form, Welcome T [facility] o Methods: Explain/Verbal Responses: State content correctly Wound/Skin Impairment: Handouts: Caring for Your Ulcer Methods: Explain/Verbal Responses: State content correctly Electronic Signature(s) Signed: 01/17/2023 12:06:18 PM By: [name] NEGRI, [name] ([id number]) By: [name] [phone].pdf Page 8 of 9 Signed: 01/17/2023 12:06:18 PM Entered By: [name] on 01/17/2023 08:37:47 -------------------------------------------------------------------------------- Wound Assessment Details Patient Name: Date of Service: Donald [name] RD L. 01/17/2023 10:00 A M Medical Record Number: [id number] Patient Account Number: [id number] Date of Birth/Sex: Treating RN: May 24, 1945 (78 y.o. NETTY [name] Primary Care Jazper Nikolai: Rilla Baller Other Clinician: Referring Jakobee Brackins: Treating Latha Staunton/Extender: Bethena Andre ENDOW, SA MUEL Weeks in Treatment: 0 Wound Status Wound Number: 1 Primary 4th degree Burn Etiology: Wound Location: Midline Sacrum Wound Open Wounding Event: Thermal Burn Status: Date Acquired: 12/23/2022 Comorbid Cataracts, Anemia, Asthma, Sleep Apnea, Arrhythmia, Congestive Weeks Of Treatment: 0 History: Heart Failure, Coronary Artery Disease, Deep Vein Thrombosis, Clustered Wound: No Hypertension, Myocardial Infarction, Peripheral Arterial Disease, Type II Diabetes, History of Burn, Gout, Neuropathy Photos Wound Measurements Length: (cm) 4.2 Width: (cm) 3 Depth: (cm) 2.3 Area: (cm) 9.896 Volume: (cm) 22.761 % Reduction in Area: % Reduction in Volume: Epithelialization: None Tunneling: No Undermining: Yes Starting  Position (o'clock): 12 Ending Position (o'clock): 6 Maximum Distance: (cm) 2.8 Wound Description Classification: Full Thickness Without Exposed Suppo Exudate Amount: Large Exudate Type: Serosanguineous Exudate Color: red, brown rt Structures Foul Odor After Cleansing: No Slough/Fibrino Yes Wound Bed Granulation Amount: Medium (34-66%) Exposed Structure Granulation Quality: Red, Pink Fat Layer (Subcutaneous Tissue) Exposed: Yes Necrotic Amount: Medium (34-66%) Necrotic Quality: Adherent Scientist, Physiological) Signed: 01/17/2023 12:06:18 PM By: [name] NEGRI, [name] ([id number]) By: [name] [phone].pdf Page 9 of 9 Signed: 01/17/2023 12:06:18 PM Entered By: [name] on 01/17/2023 07:38:50 -------------------------------------------------------------------------------- Vitals Details Patient Name: Date of Service: Donald [name], EDWA RD L. 01/17/2023 10:00 A M Medical Record Number: [id number] Patient Account Number: [id number] Date of Birth/Sex: Treating RN: 10/07/1945 (78 y.o. NETTY [name] Primary Care Mayjor Ager: Rilla Baller Other Clinician: Referring Lashona Schaaf: Treating Arwyn Besaw/Extender: Bethena Andre ENDOW, SA MUEL Weeks in Treatment: 0 Vital Signs Time Taken: 10:20 Temperature (F): 97.7 Height (in): 65 Pulse (bpm): 65 Source: Stated Respiratory Rate (breaths/min): 18 Weight (lbs): 233 Blood Pressure (mmHg): 173/63 Source: Stated Reference Range: 80 - 120 mg / dl Body Mass Index (BMI): 38.8 Electronic Signature(s) Signed: 01/17/2023 12:06:18 PM By: [name] Entered By: [name] on 01/17/2023 07:20:47
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
## 2023-01-17 NOTE — Progress Notes (Signed)
 Donald, [name] ([id number]) [phone] [email or web address] Page 1 of 5 Visit Report for 01/17/2023 Abuse Risk Screen Details Patient Name: Date of Service: Donald [name] NORDMANN RD L. 01/17/2023 10:00 A M Medical Record Number: [id number] Patient Account Number: [id number] Date of Birth/Sex: Treating RN: [birth year] (78 y.o. Donald [name] [name] Primary Care Genea Rheaume: Rilla Baller Other Clinician: Referring Herbert Marken: Treating Toma Erichsen/Extender: Bethena Andre ENDOW, SA MUEL Weeks in Treatment: 0 Abuse Risk Screen Items Answer ABUSE RISK SCREEN: Has anyone close to you tried to hurt or harm you recentlyo No Do you feel uncomfortable with anyone in your familyo No Has anyone forced you do things that you didnt want to doo No Electronic Signature(s) Signed: 01/17/2023 12:06:18 PM By: [name] Entered By: [name] on 01/17/2023 10:24:45 -------------------------------------------------------------------------------- Activities of Daily Living Details Patient Name: Date of Service: Donald [name] NORDMANN RD L. 01/17/2023 10:00 A M Medical Record Number: [id number] Patient Account Number: [id number] Date of Birth/Sex: Treating RN: 1945-11-16 (78 y.o. Donald [name] [name] Primary Care Diasha Castleman: Rilla Baller Other Clinician: Referring Caidan Hubbert: Treating Cheron Pasquarelli/Extender: Bethena Andre ENDOW, SA MUEL Weeks in Treatment: 0 Activities of Daily Living Items Answer Activities of Daily Living (Please select one for each item) Drive Automobile Completely Able T Medications ake Completely Able Use T elephone Completely Able Care for Appearance Completely Able Use T oilet Completely Able Bath / Shower Completely Able Dress Self Completely Able Feed Self Completely Able Walk Completely Able Get In / Out Bed Completely Able Housework Completely Able ALVAR, Donald [name] ([id number]) [id number] Page 2 of 5 Prepare Meals Completely Able Handle  Money Completely Able Shop for Self Completely Able Electronic Signature(s) Signed: 01/17/2023 12:06:18 PM By: [name] Entered By: [name] on 01/17/2023 10:25:10 -------------------------------------------------------------------------------- Education Screening Details Patient Name: Date of Service: Donald [name], EDWA RD L. 01/17/2023 10:00 A M Medical Record Number: [id number] Patient Account Number: [id number] Date of Birth/Sex: Treating RN: 12/22/1945 (78 y.o. Donald [name] [name] Primary Care Maloree Uplinger: Rilla Baller Other Clinician: Referring Alyssia Heese: Treating Garima Chronis/Extender: Bethena Andre ENDOW, SA MUEL Weeks in Treatment: 0 Primary Learner Assessed: Patient Learning Preferences/Education Level/Primary Language Learning Preference: Explanation, Demonstration, Video, Communication Board, Printed Material Preferred Language: English Cognitive Barrier Language Barrier: No Translator Needed: No Memory Deficit: No Emotional Barrier: No Cultural/Religious Beliefs Affecting Medical Care: No Physical Barrier Impaired Vision: Yes Glasses Impaired Hearing: No Decreased Hand dexterity: No Knowledge/Comprehension Knowledge Level: High Comprehension Level: High Ability to understand written instructions: High Ability to understand verbal instructions: High Motivation Anxiety Level: Calm Cooperation: Cooperative Education Importance: Acknowledges Need Interest in Health Problems: Asks Questions Perception: Coherent Willingness to Engage in Self-Management High Activities: Readiness to Engage in Self-Management High Activities: Electronic Signature(s) Signed: 01/17/2023 12:06:18 PM By: [name] Entered By: [name] on 01/17/2023 10:25:30 Donald [name] ([id number]) [id number] Page 3 of 5 -------------------------------------------------------------------------------- Fall Risk Assessment Details Patient Name:  Date of Service: Donald [name] NORDMANN RD L. 01/17/2023 10:00 A M Medical Record Number: [id number] Patient Account Number: [id number] Date of Birth/Sex: Treating RN: 1945-09-25 (78 y.o. Donald [name] [name] Primary Care Sibbie Flammia: Rilla Baller Other Clinician: Referring Morgon Pamer: Treating Mervin Ramires/Extender: Bethena Andre ENDOW, SA MUEL Weeks in Treatment: 0 Fall Risk Assessment Items Have you had 2 or more falls in the last 12 monthso 0 Yes Have you had any fall that resulted in injury in the last 12 monthso 0 No FALLS RISK SCREEN History of falling - immediate or within 3 months 25 Yes Secondary diagnosis (Do you have 2 or more medical diagnoseso)  0 No Ambulatory aid None/bed rest/wheelchair/nurse 0 No Crutches/cane/walker 15 Yes Furniture 0 No Intravenous therapy Access/Saline/Heparin  Lock 0 No Gait/Transferring Normal/ bed rest/ wheelchair 0 Yes Weak (short steps with or without shuffle, stooped but able to lift head while walking, may seek 0 No support from furniture) Impaired (short steps with shuffle, may have difficulty arising from chair, head down, impaired 0 No balance) Mental Status Oriented to own ability 0 Yes Electronic Signature(s) Signed: 01/17/2023 12:06:18 PM By: [name] Entered By: [name] on 01/17/2023 10:25:53 -------------------------------------------------------------------------------- Foot Assessment Details Patient Name: Date of Service: Donald SAILOR, EDWA RD L. 01/17/2023 10:00 A M Medical Record Number: [id number] Patient Account Number: [id number] Date of Birth/Sex: Treating RN: 05/30/1945 (78 y.o. Donald [name] [name] Primary Care Kayvion Arneson: Rilla Baller Other Clinician: Referring Ezra Marquess: Treating Lakynn Halvorsen/Extender: Bethena Andre ENDOW, SA MUEL Weeks in Treatment: 0 Foot Assessment Items Site Locations [employer], NEVADA L ([id number]) [phone] [id number] Page 4 of 5 + = Sensation present, - = Sensation absent, C  = Callus, U = Ulcer R = Redness, W = Warmth, M = Maceration, PU = Pre-ulcerative lesion F = Fissure, S = Swelling, D = Dryness Assessment Right: Left: Other Deformity: No No Prior Foot Ulcer: No No Prior Amputation: No No Charcot Joint: No No Ambulatory Status: Ambulatory With Help Assistance Device: Cane Gait: Steady Notes wound on sacrum Electronic Signature(s) Signed: 01/17/2023 12:06:18 PM By: [name] Entered By: [name] on 01/17/2023 10:26:26 -------------------------------------------------------------------------------- Nutrition Risk Screening Details Patient Name: Date of Service: Donald SAILOR, EDWA RD L. 01/17/2023 10:00 A M Medical Record Number: [id number] Patient Account Number: [id number] Date of Birth/Sex: Treating RN: 10/16/1945 (78 y.o. Donald [name] [name] Primary Care Laurine Kuyper: Rilla Baller Other Clinician: Referring Lejla Moeser: Treating Landen Breeland/Extender: Bethena Andre ENDOW, SA MUEL Weeks in Treatment: 0 Height (in): 65 Weight (lbs): 233 Body Mass Index (BMI): 38.8 Nutrition Risk Screening Items Score Screening NUTRITION RISK SCREEN: I have an illness or condition that made me change the kind and/or amount of food I eat 0 No Talmadge, Lakeith L ([id number]) [id number] Page 5 of 5 I eat fewer than two meals per day 0 No I eat few fruits and vegetables, or milk products 0 No I have three or more drinks of beer, liquor or wine almost every day 0 No I have tooth or mouth problems that make it hard for me to eat 0 No I don't always have enough money to buy the food I need 0 No I eat alone most of the time 0 No I take three or more different prescribed or over-the-counter drugs a day 0 No Without wanting to, I have lost or gained 10 pounds in the last six months 0 No I am not always physically able to shop, cook and/or feed myself 0 No Nutrition Protocols Good Risk Protocol 0 No interventions needed Moderate Risk  Protocol High Risk Proctocol Risk Level: Good Risk Score: 0 Electronic Signature(s) Signed: 01/17/2023 12:06:18 PM By: [name] Entered By: [name] on 01/17/2023 10:26:05

## 2023-01-20 ENCOUNTER — Encounter: Payer: Self-pay | Admitting: Family Medicine

## 2023-01-20 ENCOUNTER — Ambulatory Visit: Payer: HMO | Admitting: Family Medicine

## 2023-01-20 VITALS — BP 116/70 | HR 73 | Temp 97.7°F | Resp 18 | Ht 63.0 in | Wt 240.2 lb

## 2023-01-20 DIAGNOSIS — N1832 Chronic kidney disease, stage 3b: Secondary | ICD-10-CM | POA: Diagnosis not present

## 2023-01-20 DIAGNOSIS — Z794 Long term (current) use of insulin: Secondary | ICD-10-CM

## 2023-01-20 DIAGNOSIS — E440 Moderate protein-calorie malnutrition: Secondary | ICD-10-CM | POA: Diagnosis not present

## 2023-01-20 DIAGNOSIS — N183 Chronic kidney disease, stage 3 unspecified: Secondary | ICD-10-CM | POA: Diagnosis not present

## 2023-01-20 DIAGNOSIS — E1142 Type 2 diabetes mellitus with diabetic polyneuropathy: Secondary | ICD-10-CM | POA: Diagnosis not present

## 2023-01-20 DIAGNOSIS — E1122 Type 2 diabetes mellitus with diabetic chronic kidney disease: Secondary | ICD-10-CM

## 2023-01-20 DIAGNOSIS — K5903 Drug induced constipation: Secondary | ICD-10-CM

## 2023-01-20 DIAGNOSIS — N179 Acute kidney failure, unspecified: Secondary | ICD-10-CM | POA: Diagnosis not present

## 2023-01-20 DIAGNOSIS — R252 Cramp and spasm: Secondary | ICD-10-CM | POA: Diagnosis not present

## 2023-01-20 DIAGNOSIS — Z22322 Carrier or suspected carrier of Methicillin resistant Staphylococcus aureus: Secondary | ICD-10-CM | POA: Diagnosis not present

## 2023-01-20 DIAGNOSIS — S31000D Unspecified open wound of lower back and pelvis without penetration into retroperitoneum, subsequent encounter: Secondary | ICD-10-CM

## 2023-01-20 DIAGNOSIS — I480 Paroxysmal atrial fibrillation: Secondary | ICD-10-CM

## 2023-01-20 DIAGNOSIS — I5032 Chronic diastolic (congestive) heart failure: Secondary | ICD-10-CM

## 2023-01-20 DIAGNOSIS — L089 Local infection of the skin and subcutaneous tissue, unspecified: Secondary | ICD-10-CM

## 2023-01-20 MED ORDER — FREESTYLE LIBRE 2 SENSOR MISC: 1.0000 | 3 refills | Status: DC

## 2023-01-20 NOTE — Patient Instructions (Addendum)
 Labs today  Continue miralax 1 capful daily with 8 oz of fluids.  Let us know if this doesn't help with constipation.  Return to see me in 3 months.

## 2023-01-20 NOTE — Progress Notes (Signed)
 Ph: [phone] Fax: [phone]   Patient ID: Donald [name], male    DOB: 12-20-1945, 78 y.o.   MRN: [id number]  This visit was conducted in person.  BP 116/70   Pulse 73   Temp 97.7 F (36.5 C) (Oral)   Resp 18   Ht 5' 3 (1.6 m)   Wt 240 lb 4 oz (109 kg)   SpO2 99%   BMI 42.56 kg/m    CC: hospital f/u visit  Subjective:   HPI: Donald [name] is a 78 y.o. male presenting on 01/20/2023 for Hospitalization Follow-up   See prior notes for details.  Recent hospitalization for sacral pressure sore with cellulitis and abscess s/p I&D in OR by surgeon on 01/08/2023. Wound culture grew MRSA. Has been treated with doxycycline  antibiotic. CT reassuringly without osteomyelitis, did show aortic ATH.  Hospital records reviewed. Med rec performed.  Patient did not meet sepsis criteria on admission.  He finished doxycycline  antibiotic yesterday.   Notes leg aches worst at night, at thighs. He is chronically on lovastatin  80mg  nightly.  Notes marked constipation. He's cut down on opiate due to this. Normally manages with dulcolax, took 3 tablets last night without benefit. Also using miralax 1 capful daily for the past 2 days. Normal flatus.  10 lb weight gain since last seen in office - appetite is better. Will stop glucerna supplement.  Saw cardiology in the hospital due to afib, eliquis  was started in place of aspirin  81mg  daily.   DM - continues novolin  R and N through endo. Next appt is 02/03/2023.  CGM - uses freestyle libre 2. Requests new Rx to pharmacy.  Lab Results  Component Value Date   HGBA1C 7.3 (A) 01/01/2023   Home health with [facility] set up: skilled nursing for wound care, PT x1 - no further need.  Medical supply agency: [employer].   Other follow up appointments:  Saw general surgeon Dr Jayson Endow at [facility] surgical follow up on 01/16/2023 - continue packing wound daily with wet to dry gauze. Planned f/u in 2 weeks.  Also saw wound clinic Andre Dustman PA on  01/16/2022 with planned f/u 01/24/2023.  Next gen surg appt 01/30/2023, cardiology appt 01/31/2023.  Planning to start using dakin's solution and desitin in place of slivadene.  ______________________________________________________________________ Hospital admission: 01/06/2023 Hospital discharge: 01/10/2023 TCM f/u phone call:  performed on 01/14/2023  Recommendations at discharge:  Follow-up with PCP in 1 week. Follow-up with cardiology in 2 weeks. Follow-up with general surgery in 1 week.   Discharge Diagnoses: Principal Problem:   Sacral wound, subsequent encounter Active Problems:   Severe sepsis (HCC) -> ruled out   Dyslipidemia associated with type 2 diabetes mellitus (HCC)   Iron deficiency anemia   Essential hypertension   History of pulmonary embolus (PE)   GERD   OSA (obstructive sleep apnea)   CAD (coronary artery disease)   Obesity, Class III, BMI 40-49.9 (morbid obesity) (HCC)   Heart failure with preserved ejection fraction (HCC)   Paroxysmal atrial fibrillation (HCC)   Uncontrolled type 2 diabetes mellitus with hyperglycemia, with long-term current use of insulin  (HCC)   Acute kidney injury superimposed on stage 3b chronic kidney disease (HCC)   Cellulitis of buttock   Hyponatremia   MRSA (methicillin resistant staph aureus) culture positive   Bradycardia     Relevant past medical, surgical, family and social history reviewed and updated as indicated. Interim medical history since our last visit reviewed. Allergies and medications reviewed and  updated. Outpatient Medications Prior to Visit  Medication Sig Dispense Refill   albuterol  (VENTOLIN  HFA) 108 (90 Base) MCG/ACT inhaler Inhale 2 puffs into the lungs every 6 (six) hours as needed for wheezing or shortness of breath. 8 g 0   allopurinol  (ZYLOPRIM ) 300 MG tablet TAKE ONE TABLET BY MOUTH EVERY DAY 90 tablet 4   ALPRAZolam  (XANAX ) 1 MG tablet TAKE 1 TABLET BY MOUTH AT BEDTIME IF NEEDED FOR SLEEP. (Patient  taking differently: Take 0.5 mg by mouth at bedtime.) 30 tablet 0   apixaban  (ELIQUIS ) 5 MG TABS tablet Take 1 tablet (5 mg total) by mouth 2 (two) times daily. 60 tablet 0   Cholecalciferol (VITAMIN D3) 25 MCG (1000 UT) CAPS Take 1 capsule (1,000 Units total) by mouth daily. 30 capsule    colchicine  0.6 MG tablet TAKE 1 TABLET BY MOUTH DAILY AS NEEDED FOR GOUT GLARE. ON FIRST DAY OF GOUT FLARE,MAY TAKE 2 TABLETS AT ONCE AS DIRECTED 30 tablet 0   cyanocobalamin  (VITAMIN B12) 1000 MCG/ML injection INJECT 1ML INTRAMUSCULARLY EVERY 30 DAYS 1 mL 6   cyclobenzaprine  (FLEXERIL ) 10 MG tablet Take 1 tablet (10 mg total) by mouth 3 (three) times daily as needed for muscle spasms. 30 tablet 0   doxazosin  (CARDURA ) 1 MG tablet TAKE ONE TABLET BY MOUTH TWICE DAILY 180 tablet 2   furosemide  (LASIX ) 80 MG tablet TAKE 1 TABLET BY MOUTH EVERY DAY. MAY TAKE AN EXTRA PILL FOR SWELLING. 90 tablet 1   gabapentin  (NEURONTIN ) 600 MG tablet TAKE TWO TABLETS AT BEDTIME 180 tablet 3   glucose blood (GE100 BLOOD GLUCOSE TEST) test strip TEST FOUR TIMES DAILY 200 each 10   HYDROcodone -acetaminophen  (NORCO) 10-325 MG tablet Take 1 tablet by mouth every 6 (six) hours as needed for moderate pain ((typically once to twice daily if needed)).  30 tablet 0   isosorbide  mononitrate (IMDUR ) 30 MG 24 hr tablet TAKE ONE TABLET BY MOUTH DAILY 90 tablet 2   losartan  (COZAAR ) 25 MG tablet TAKE ONE TABLET BY MOUTH AT BEDTIME 90 tablet 1   lovastatin  (MEVACOR ) 40 MG tablet TAKE TWO TABLETS BY MOUTH AT BEDTIME 180 tablet 1   meclizine (ANTIVERT) 25 MG tablet Take 25 mg by mouth 3 (three) times daily as needed for dizziness.     Naphazoline-Pheniramine (OPCON-A  OP) Place 1 drop into both eyes 3 (three) times daily as needed (itchy eyes.).      nitroGLYCERIN  (NITROSTAT ) 0.4 MG SL tablet Place 1 tablet (0.4 mg total) under the tongue every 5 (five) minutes as needed for chest pain. 25 tablet 3   NOVOLIN  N 100 UNIT/ML injection INJECT 160-170  UNITS TOTAL INTO THE SKIN AT BEDTIME 100 mL 0   NOVOLIN  R 100 UNIT/ML injection INJECT 140 UNITS IN THE MORNING, 140 UNITS IN THE AFTERNOON, AND 160-180 UNITS IN THE EVENING USE BEFORE A MEAL 140 mL 1   Sennosides-Docusate Sodium  (STOOL SOFTENER & LAXATIVE PO) Take 2 tablets by mouth at bedtime.     Syringe/Needle, Disp, (SYRINGE 3CC/22GX1-1/2) 22G X 1-1/2 3 ML MISC Use to administer monthly b12 shots 50 each 0   aspirin  81 MG EC tablet Take 81 mg by mouth daily. Swallow whole.     Continuous Glucose Sensor (FREESTYLE LIBRE 2 SENSOR) MISC 1 Device by Does not apply route every 14 (fourteen) days. 6 each 3   doxycycline  (VIBRA -TABS) 100 MG tablet Take 1 tablet (100 mg total) by mouth 2 (two) times daily. 14 tablet 0   fluticasone  (  FLONASE ) 50 MCG/ACT nasal spray Place 2 sprays into both nostrils daily. 16 g 1   silver  sulfADIAZINE  (SILVADENE ) 1 % cream Apply topically 2 (two) times daily.     No facility-administered medications prior to visit.     Per HPI unless specifically indicated in ROS section below Review of Systems  Objective:  BP 116/70   Pulse 73   Temp 97.7 F (36.5 C) (Oral)   Resp 18   Ht 5' 3 (1.6 m)   Wt 240 lb 4 oz (109 kg)   SpO2 99%   BMI 42.56 kg/m   Wt Readings from Last 3 Encounters:  01/20/23 240 lb 4 oz (109 kg)  01/16/23 234 lb (106.1 kg)  01/10/23 230 lb 9.6 oz (104.6 kg)      Physical Exam Vitals and nursing note reviewed.  Constitutional:      Appearance: Normal appearance. He is not ill-appearing.  Cardiovascular:     Rate and Rhythm: Normal rate. Rhythm irregular.     Pulses: Normal pulses.     Heart sounds: Murmur (3/6 systolic USB) heard.  Pulmonary:     Effort: Pulmonary effort is normal. No respiratory distress.     Breath sounds: Normal breath sounds. No wheezing, rhonchi or rales.  Musculoskeletal:     Right lower leg: Edema (1+) present.     Left lower leg: Edema (1+) present.  Skin:    General: Skin is warm and dry.      Findings: No rash.          Comments: Sacral wound with dressing c/d/i, wound not evaluated  Neurological:     Mental Status: He is alert.  Psychiatric:        Mood and Affect: Mood normal.        Behavior: Behavior normal.       Lab Results  Component Value Date   NA 137 01/08/2023   CL 101 01/08/2023   K 4.6 01/08/2023   CO2 29 01/08/2023   BUN 55 (H) 01/08/2023   CREATININE 1.64 (H) 01/10/2023   GFRNONAA 43 (L) 01/10/2023   CALCIUM  8.6 (L) 01/08/2023   PHOS 3.6 06/25/2022   ALBUMIN  2.4 (L) 01/07/2023   GLUCOSE 172 (H) 01/08/2023    Lab Results  Component Value Date   WBC 8.0 01/07/2023   HGB 10.7 (L) 01/07/2023   HCT 31.8 (L) 01/07/2023   MCV 87.1 01/07/2023   PLT 287 01/07/2023   Lab Results  Component Value Date   HGBA1C 7.3 (A) 01/01/2023    Assessment & Plan:   Problem List Items Addressed This Visit     CKD stage 3 due to type 2 diabetes mellitus [facility])   Sees nephrology Dr Rayburn Latest GFR 43 - update renal panel.       Type 2 diabetes mellitus with diabetic polyneuropathy (HCC)   Sees endo. CGM Freestyle Libre 2 Rx for sensors sent to pharmacy  He has upcoming endo appt.       Constipation   Worse presumed opiate induced.  Discussed bowel regimen - to increase miralax frequency until effect.       Heart failure with preserved ejection fraction (HCC)   Paroxysmal atrial fibrillation (HCC)   Thigh cramp   Cramping to thighs noted over last 1-2 wks.  Check CPK in statin use.       Infected wound   S/p OR I&D 01/08/2023, completed doxycycline  course for culture confirmed MRSA.  Now followed by gen surgery and wound  clinic, as well as HH SN - appreciate their care.  Continue dressing changes as instructed.       Protein-calorie malnutrition (HCC)   Appetite is improved, weight gain noted. Will stop glucerna.  Update albumin  levels.       Acute kidney injury superimposed on stage 3b chronic kidney disease (HCC)   Update renal panel.        Sacral wound, subsequent encounter - Primary   S/p OR I&D 01/08/2023, completed doxycycline  course for culture confirmed MRSA.  Now followed by gen surgery and wound clinic, as well as HH SN - appreciate their care.  Continue dressing changes as instructed.       Relevant Orders   Renal function panel   CBC with Differential/Platelet   CK   MRSA (methicillin resistant staph aureus) culture positive     Meds ordered this encounter  Medications   Continuous Glucose Sensor (FREESTYLE LIBRE 2 SENSOR) MISC    Sig: 1 Device by Does not apply route every 14 (fourteen) days.    Dispense:  6 each    Refill:  3    Orders Placed This Encounter  Procedures   Renal function panel   CBC with Differential/Platelet   CK    Patient Instructions  Labs today  Continue miralax 1 capful daily with 8 oz of fluids.  Let us  know if this doesn't help with constipation.  Return to see me in 3 months.   Follow up plan: Return in about 3 months (around 04/20/2023), or if symptoms worsen or fail to improve, for follow up visit.  Anton Blas, MD

## 2023-01-20 NOTE — Progress Notes (Signed)
 Outpatient Surgical Follow Up  01/16/2023  Donald [name] is an 78 y.o. male.   Chief Complaint  Patient presents with   Routine Post Op    HPI: The patient returns today after a incision and drainage as well as debridement of a sacral burn.  He came in on Christmas Eve with pain in his sacrum after burning it with a heating pad.  At that time he had purulent drainage and was then taken to the operating room the next day for an incision and drainage as well as debridement.  He also had cellulitic changes in the surrounding skin.  The cultures obtained from this were MRSA positive but sensitive to doxycycline .  He has been on doxycycline  since then.  He reports doing okay.  He continues to have pain at the wound.  He denies any significant drainage of the wound.  His wife does report that the area seems to be getting smaller.  He denies any fevers or chills.  Past Medical History:  Diagnosis Date   (HFpEF) heart failure with preserved ejection fraction (HCC)    a. 09/2018 Echo: EF 60-65%. PASP . Mild-mod LAE. Mild MR/TR.   ANEMIA-IRON DEFICIENCY 07/26/2006   Anxiety    ASTHMA 07/26/2006   Asthma    Back pain, chronic    Bell's palsy    CKD (chronic kidney disease), stage III (HCC)    COLONIC POLYPS, HX OF 07/26/2006   COVID-19 virus infection 01/2020   DEPRESSION 03/14/2009   DIABETES MELLITUS, TYPE II 07/26/2006   DISC DISEASE, LUMBAR 10/05/2007   DVT 12/03/2007   DYSLIPIDEMIA 04/13/2009   Dyspnea    when gets up and walks around and back is hurting really bad -only Shortness of breath  then   GERD 07/26/2006   Gout    Heart murmur    History of kidney stones    HYPERTENSION 07/26/2006   INSOMNIA 08/21/2007   Neuropathy    Nonobstructive CAD (coronary artery disease)    a. 2012 Cath: no high grade stenosis; b. 2018 MV: No ischemia. Attenuation artifact.    OBSTRUCTIVE SLEEP APNEA 12/03/2007   Use C-PAP   PERIPHERAL NEUROPATHY 07/26/2006   Pernicious anemia  11/20/2006   PULMONARY EMBOLISM 10/05/2007   Syncope and collapse 10/30/2020   zio patch 11/2020: Predominant sinus bradycardia with an average rate of 54 betas per minute.  Type I second degree heart block was occasionally present w/o significant pauses. Short lived SVT x1 with occasional symptomatic PVCs, ventricular trigeminy present     Vertigo     Past Surgical History:  Procedure Laterality Date   APPENDECTOMY  1968   BACK SURGERY  1977, 06/14/2008   Dr. Malcolm at [facility] [phone])   CARDIAC CATHETERIZATION     CARPAL TUNNEL RELEASE Right 10/2017   CATARACT EXTRACTION W/PHACO Left 05/30/2014   Procedure: CATARACT EXTRACTION PHACO AND INTRAOCULAR LENS PLACEMENT (IOC);  Surgeon: Steven Dingeldein, MD;  Location: [facility];  Service: Ophthalmology;  Laterality: Left;  US  01:20 AP% 23.7 CDE 31.86   CATARACT EXTRACTION W/PHACO Right 09/06/2021   Procedure: CATARACT EXTRACTION PHACO AND INTRAOCULAR LENS PLACEMENT (IOC) RIGHT DIABETIC VISION BLUE OMIDRIA  MALYUGIN IRIS HOOKS;  Surgeon: Enola Feliciano Hugger, MD;  Location: [facility];  Service: Ophthalmology;  Laterality: Right;  14.65 2.11.1   COLONOSCOPY  10/2002   HP, SSA, TA, rpt 3 yrs ([place])   COLONOSCOPY  01/2005   diverticulosis rpt 5 yrs ([facility])    EPIDURAL BLOCK INJECTION Bilateral 06/2021  bilat L5 nerve block with benefit ([place])   INCISION AND DRAINAGE PERIRECTAL ABSCESS N/A 01/08/2023   Procedure: IRRIGATION AND DEBRIDEMENT SACRAL WOUND;  Surgeon: Marinda Jayson KIDD, MD;  Location: [facility];  Service: General;  Laterality: N/A;   IR NEPHROSTOMY PLACEMENT LEFT  10/19/2018   IR URETERAL STENT LEFT NEW ACCESS W/O SEP NEPHROSTOMY CATH  10/19/2018   LEFT HEART CATH AND CORONARY ANGIOGRAPHY N/A 11/01/2020   Procedure: LEFT HEART CATH AND CORONARY ANGIOGRAPHY;  Surgeon: Mady Bruckner, MD;  Location: [facility];  Service: Cardiovascular;  Laterality: N/A;   LITHOTRIPSY     X 2   NEPHROLITHOTOMY Left  10/19/2018   Procedure: NEPHROLITHOTOMY PERCUTANEOUS;  Surgeon: Matilda Senior, MD;  Location: [facility];  Service: Urology;  Laterality: Left;  3 HRS   PARATHYROIDECTOMY Right 04/18/2016   PARATHYROIDECTOMY for cyst Ole Hunter, MD)   SHOULDER ARTHROSCOPY W/ ROTATOR CUFF REPAIR Right    TONSILLECTOMY      Family History  Problem Relation Age of Onset   Cancer Mother        Breast Cancer   Cancer Sister        Breast Cancer   Diabetes Father    Heart disease Father    Diabetes Paternal Grandfather     Social History:  reports that he has never smoked. He has never been exposed to tobacco smoke. He has never used smokeless tobacco. He reports that he does not drink alcohol and does not use drugs.  Allergies:  Allergies  Allergen Reactions   Codeine  Other (See Comments)    REACTION: chest pain   Pioglitazone Other (See Comments)    REACTION to Actos: swelling in ankles    Medications reviewed.    ROS Full ROS performed and is otherwise negative other than what is stated in HPI   BP 114/61   Pulse 66   Temp 98.2 F (36.8 C)   Ht 5' 3 (1.6 m)   Wt 234 lb (106.1 kg)   SpO2 97%   BMI 41.45 kg/m   Physical Exam His sacrum dressing was removed.  The total area of the wound has decreased in size from 5 cm x 4 cm x 3 cm to 4 cm x 3 cm x 2 cm.  The wound base appears healthy and there is no obvious infection.  At the inferior part of the wound there is some fibrinous tissue.  I sharply debrided this down to bleeding tissue please see the procedure note below.  The surrounding skin still has some blanching erythema but the spread of this has decreased.    No results found. However, due to the size of the patient record, not all encounters were searched. Please check Results Review for a complete set of results. No results found.  Assessment/Plan:  The patient is a 78 year old who sustained a burn wound to his sacrum using a heating pad.  This got infected causing  a abscess down to muscle tissue.  He is status post incision and drainage of this abscess with debridement of the subcutaneous tissue and muscle.  He has been packing the wound daily with wet to dry gauze.  He is scheduled to see wound care tomorrow.  I did do some debridement of fibrinous tissue today.  We will continue to pack it with wet-to-dry dressing and appreciate wound care recommendations.  He will complete his course of doxycycline  and I will plan to see him again in 2 weeks.   Procedure note  After informed consent was obtained the patient sacrum was exposed.  I debrided sharply with scissors approximately 3 cm of fibrinous tissue.  The fibrinous tissue was excised and this was taken down to bleeding healthy tissue.  The wound was then packed with wet-to-dry gauze and covered with a sacral heart.  The patient tolerated the procedure well  Jayson Endow, M.D. [facility]

## 2023-01-21 ENCOUNTER — Encounter: Payer: Self-pay | Admitting: Family Medicine

## 2023-01-21 LAB — CBC WITH DIFFERENTIAL/PLATELET
Basophils Absolute: 0 10*3/uL (ref 0.0–0.1)
Basophils Relative: 0.5 % (ref 0.0–3.0)
Eosinophils Absolute: 0.2 10*3/uL (ref 0.0–0.7)
Eosinophils Relative: 3.1 % (ref 0.0–5.0)
HCT: 36.6 % — ABNORMAL LOW (ref 39.0–52.0)
Hemoglobin: 12 g/dL — ABNORMAL LOW (ref 13.0–17.0)
Lymphocytes Relative: 20.8 % (ref 12.0–46.0)
Lymphs Abs: 1.4 10*3/uL (ref 0.7–4.0)
MCHC: 32.7 g/dL (ref 30.0–36.0)
MCV: 90.8 fL (ref 78.0–100.0)
Monocytes Absolute: 0.4 10*3/uL (ref 0.1–1.0)
Monocytes Relative: 6.6 % (ref 3.0–12.0)
Neutro Abs: 4.6 10*3/uL (ref 1.4–7.7)
Neutrophils Relative %: 69 % (ref 43.0–77.0)
Platelets: 198 10*3/uL (ref 150.0–400.0)
RBC: 4.03 Mil/uL — ABNORMAL LOW (ref 4.22–5.81)
RDW: 17.4 % — ABNORMAL HIGH (ref 11.5–15.5)
WBC: 6.6 10*3/uL (ref 4.0–10.5)

## 2023-01-21 LAB — RENAL FUNCTION PANEL
Albumin: 3.2 g/dL — ABNORMAL LOW (ref 3.5–5.2)
BUN: 32 mg/dL — ABNORMAL HIGH (ref 6–23)
CO2: 31 meq/L (ref 19–32)
Calcium: 8.8 mg/dL (ref 8.4–10.5)
Chloride: 102 meq/L (ref 96–112)
Creatinine, Ser: 1.42 mg/dL (ref 0.40–1.50)
GFR: 48.49 mL/min — ABNORMAL LOW (ref >60.00)
Glucose, Bld: 178 mg/dL — ABNORMAL HIGH (ref 70–99)
Phosphorus: 3.7 mg/dL (ref 2.3–4.6)
Potassium: 4.3 meq/L (ref 3.5–5.1)
Sodium: 141 meq/L (ref 135–145)

## 2023-01-21 LAB — CK: Total CK: 146 U/L (ref 7–232)

## 2023-01-21 NOTE — Assessment & Plan Note (Addendum)
 S/p OR I&D 01/08/2023, completed doxycycline course for culture confirmed MRSA.  Now followed by gen surgery and wound clinic, as well as HH SN - appreciate their care.  Continue dressing changes as instructed.

## 2023-01-21 NOTE — Assessment & Plan Note (Signed)
 Sees endo. CGM Freestyle Libre 2 Rx for sensors sent to pharmacy  He has upcoming endo appt.

## 2023-01-21 NOTE — Assessment & Plan Note (Signed)
 S/p OR I&D 01/08/2023, completed doxycycline course for culture confirmed MRSA.  Now followed by gen surgery and wound clinic, as well as HH SN - appreciate their care.  Continue dressing changes as instructed.

## 2023-01-21 NOTE — Assessment & Plan Note (Signed)
 Sees nephrology Dr Arrie Aran Latest GFR 43 - update renal panel.

## 2023-01-21 NOTE — Assessment & Plan Note (Signed)
Update renal panel 

## 2023-01-21 NOTE — Assessment & Plan Note (Signed)
 Cramping to thighs noted over last 1-2 wks.  Check CPK in statin use.

## 2023-01-21 NOTE — Assessment & Plan Note (Signed)
 Worse presumed opiate induced.  Discussed bowel regimen - to increase miralax frequency until effect.

## 2023-01-21 NOTE — Assessment & Plan Note (Addendum)
 Appetite is improved, weight gain noted. Will stop glucerna.  Update albumin levels.

## 2023-01-24 ENCOUNTER — Encounter: Payer: PPO | Admitting: Physician Assistant

## 2023-01-24 ENCOUNTER — Telehealth: Payer: Self-pay | Admitting: Family Medicine

## 2023-01-24 DIAGNOSIS — T2135XA Burn of third degree of buttock, initial encounter: Secondary | ICD-10-CM | POA: Diagnosis not present

## 2023-01-24 DIAGNOSIS — T2134XA Burn of third degree of lower back, initial encounter: Secondary | ICD-10-CM | POA: Diagnosis not present

## 2023-01-24 NOTE — Progress Notes (Addendum)
 Donald, [name] ([id number]) [phone].pdf Page 1 of 9 Visit Report for 01/24/2023 Arrival Information Details Patient Name: Date of Service: Donald [name] RD L. 01/24/2023 9:30 A M Medical Record Number: [id number] Patient Account Number: [id number] Date of Birth/Sex: Treating RN: Mar 21, 1945 (78 y.o. Donald [name] Primary Care Jenea Dake: Rilla Baller Other Clinician: Purcell Sniff Referring Maia Handa: Treating Burman Bruington/Extender: Bethena Andre Rilla Baller Weeks in Treatment: 1 Visit Information History Since Last Visit All ordered tests and consults were completed: No Patient Arrived: Vannie Added or deleted any medications: No Arrival Time: 09:34 Any new allergies or adverse reactions: No Transfer Assistance: None Had a fall or experienced change in No Patient Identification Verified: Yes activities of daily living that may affect Secondary Verification Process Completed: Yes risk of falls: Patient Has Alerts: Yes Signs or symptoms of abuse/neglect since last visito No Patient Alerts: Patient on Blood Thinner Hospitalized since last visit: No Implantable device outside of the clinic excluding No cellular tissue based products placed in the center since last visit: Has Dressing in Place as Prescribed: Yes Pain Present Now: Yes Electronic Signature(s) Signed: 01/24/2023 12:30:31 PM By: Purcell Sniff Entered By: Purcell Sniff on 01/24/2023 09:37:38 -------------------------------------------------------------------------------- Clinic Level of Care Assessment Details Patient Name: Date of Service: Donald [name] RD L. 01/24/2023 9:30 A M Medical Record Number: [id number] Patient Account Number: [id number] Date of Birth/Sex: Treating RN: Feb 14, 1945 (78 y.o. Donald [name] Primary Care Pairlee Sawtell: Rilla Baller Other Clinician: Purcell Sniff Referring Yarelli Decelles: Treating Anyela Napierkowski/Extender: Bethena Andre Rilla Baller Weeks in  Treatment: 1 Clinic Level of Care Assessment Items TOOL 1 Quantity Score []  - 0 Use when EandM and Procedure is performed on INITIAL visit ASSESSMENTS - Nursing Assessment / Reassessment []  - 0 General Physical Exam (combine w/ comprehensive assessment (listed just below) when performed on new pt. evals) []  - 0 Comprehensive Assessment (HX, ROS, Risk Assessments, Wounds Hx, etc.) Netzer, Marquis L ([id number]) [phone].pdf Page 2 of 9 ASSESSMENTS - Wound and Skin Assessment / Reassessment []  - 0 Dermatologic / Skin Assessment (not related to wound area) ASSESSMENTS - Ostomy and/or Continence Assessment and Care []  - 0 Incontinence Assessment and Management []  - 0 Ostomy Care Assessment and Management (repouching, etc.) PROCESS - Coordination of Care []  - 0 Simple Patient / Family Education for ongoing care []  - 0 Complex (extensive) Patient / Family Education for ongoing care []  - 0 Staff obtains Chiropractor, Records, T Results / Process Orders est []  - 0 Staff telephones HHA, Nursing Homes / Clarify orders / etc []  - 0 Routine Transfer to another Facility (non-emergent condition) []  - 0 Routine Hospital Admission (non-emergent condition) []  - 0 New Admissions / Manufacturing Engineer / Ordering NPWT Apligraf, etc. , []  - 0 Emergency Hospital Admission (emergent condition) PROCESS - Special Needs []  - 0 Pediatric / Minor Patient Management []  - 0 Isolation Patient Management []  - 0 Hearing / Language / Visual special needs []  - 0 Assessment of Community assistance (transportation, D/C planning, etc.) []  - 0 Additional assistance / Altered mentation []  - 0 Support Surface(s) Assessment (bed, cushion, seat, etc.) INTERVENTIONS - Miscellaneous []  - 0 External ear exam []  - 0 Patient Transfer (multiple staff / Nurse, Adult / Similar devices) []  - 0 Simple Staple / Suture removal (25 or less) []  - 0 Complex Staple / Suture removal (26 or  more) []  - 0 Hypo/Hyperglycemic Management (do not check if billed separately) []  - 0 Ankle / Brachial Index (ABI) - do not check if billed  separately Has the patient been seen at the hospital within the last three years: Yes Total Score: 0 Level Of Care: ____ Electronic Signature(s) Signed: 01/24/2023 12:30:31 PM By: Purcell Sniff Entered By: Purcell Sniff on 01/24/2023 10:10:53 -------------------------------------------------------------------------------- Encounter Discharge Information Details Patient Name: Date of Service: Donald [name], EDWA RD L. 01/24/2023 9:30 A M Medical Record Number: [id number] Patient Account Number: [id number] Date of Birth/Sex: Treating RN: May 20, 1945 (78 y.o. Donald [name] Primary Care Meline Russaw: Rilla Baller Other Clinician: Purcell Sniff Referring Hisashi Amadon: Treating Olivene Cookston/Extender: Bethena Andre Rilla Baller [place], [name] ([id number]) [phone].pdf Page 3 of 9 Weeks in Treatment: 1 Encounter Discharge Information Items Post Procedure Vitals Discharge Condition: Stable Temperature (F): 97.5 Ambulatory Status: Walker Pulse (bpm): 55 Discharge Destination: Home Respiratory Rate (breaths/min): 18 Transportation: Private Auto Blood Pressure (mmHg): 173/69 Accompanied By: wife Schedule Follow-up Appointment: Yes Clinical Summary of Care: Electronic Signature(s) Signed: 01/24/2023 12:30:31 PM By: Purcell Sniff Entered By: Purcell Sniff on 01/24/2023 10:14:45 -------------------------------------------------------------------------------- Lower Extremity Assessment Details Patient Name: Date of Service: Donald [name], EDWA RD L. 01/24/2023 9:30 A M Medical Record Number: [id number] Patient Account Number: [id number] Date of Birth/Sex: Treating RN: Nov 20, 1945 (78 y.o. Donald [name] Primary Care Kloie Whiting: Rilla Baller Other Clinician: Purcell Sniff Referring Kathrynne Kulinski: Treating Arnett Duddy/Extender:  Bethena Andre Rilla Baller Weeks in Treatment: 1 Electronic Signature(s) Signed: 01/24/2023 11:43:30 AM By: Vaughn [name] Signed: 01/24/2023 12:30:31 PM By: Purcell Sniff Entered By: Purcell Sniff on 01/24/2023 09:50:34 -------------------------------------------------------------------------------- Multi Wound Chart Details Patient Name: Date of Service: Donald [name], EDWA RD L. 01/24/2023 9:30 A M Medical Record Number: [id number] Patient Account Number: [id number] Date of Birth/Sex: Treating RN: Aug 17, 1945 (78 y.o. Donald [name] Primary Care Khadeja Abt: Rilla Baller Other Clinician: Purcell Sniff Referring Aishah Teffeteller: Treating Dmitriy Gair/Extender: Bethena Andre Rilla Baller Weeks in Treatment: 1 Vital Signs Height(in): 65 Pulse(bpm): 55 Weight(lbs): 233 Blood Pressure(mmHg): 173/69 Body Mass Index(BMI): 38.8 Temperature(F): 97.5 Respiratory Rate(breaths/min): 18 [Leifheit, Brick L ([id number]):Photos:] [phone].pdf Page 4 of 9:1 N/A N/A N/A N/A] Midline Sacrum N/A N/A Wound Location: Thermal Burn N/A N/A Wounding Event: 3rd degree Burn N/A N/A Primary Etiology: Cataracts, Anemia, Asthma, Sleep N/A N/A Comorbid History: Apnea, Arrhythmia, Congestive Heart Failure, Coronary Artery Disease, Deep Vein Thrombosis, Hypertension, Myocardial Infarction, Peripheral Arterial Disease, Type II Diabetes, History of Burn, Gout, Neuropathy 12/23/2022 N/A N/A Date Acquired: 1 N/A N/A Weeks of Treatment: Open N/A N/A Wound Status: No N/A N/A Wound Recurrence: 4x2.4x1.4 N/A N/A Measurements L x W x D (cm) 7.54 N/A N/A A (cm) : rea 10.556 N/A N/A Volume (cm) : 23.80% N/A N/A % Reduction in Area: 53.60% N/A N/A % Reduction in Volume: Full Thickness Without Exposed N/A N/A Classification: Support Structures Large N/A N/A Exudate A mount: Serosanguineous N/A N/A Exudate Type: red, brown N/A N/A Exudate Color: Medium (34-66%) N/A  N/A Granulation A mount: Red, Pink N/A N/A Granulation Quality: Medium (34-66%) N/A N/A Necrotic A mount: Fat Layer (Subcutaneous Tissue): Yes N/A N/A Exposed Structures: None N/A N/A Epithelialization: Treatment Notes Electronic Signature(s) Signed: 01/24/2023 12:30:31 PM By: Purcell Sniff Entered By: Purcell Sniff on 01/24/2023 09:50:43 -------------------------------------------------------------------------------- Multi-Disciplinary Care Plan Details Patient Name: Date of Service: Donald [name], EDWA RD L. 01/24/2023 9:30 A M Medical Record Number: [id number] Patient Account Number: [id number] Date of Birth/Sex: Treating RN: 01/05/1946 (78 y.o. Donald [name] Primary Care Mazzie Brodrick: Rilla Baller Other Clinician: Purcell Sniff Referring Marni Franzoni: Treating Aeron Donaghey/Extender: Bethena Andre Rilla Baller Weeks in Treatment: 1 Active Inactive Pressure Nursing Diagnoses: Knowledge deficit related to causes and risk  factors for pressure ulcer development AAHIL, FREDIN L ([id number]) [id number].pdf Page 5 of 9 Knowledge deficit related to management of pressures ulcers Potential for impaired tissue integrity related to pressure, friction, moisture, and shear Goals: Patient will remain free from development of additional pressure ulcers Date Initiated: 01/17/2023 Target Resolution Date: 04/14/2023 Goal Status: Active Patient will remain free of pressure ulcers Date Initiated: 01/17/2023 Target Resolution Date: 04/14/2023 Goal Status: Active Patient/caregiver will verbalize risk factors for pressure ulcer development Date Initiated: 01/17/2023 Date Inactivated: 01/17/2023 Target Resolution Date: 01/17/2023 Goal Status: Met Patient/caregiver will verbalize understanding of pressure ulcer management Date Initiated: 01/17/2023 Date Inactivated: 01/17/2023 Target Resolution Date: 01/17/2023 Goal Status: Met Interventions: Assess: immobility, friction,  shearing, incontinence upon admission and as needed Assess offloading mechanisms upon admission and as needed Assess potential for pressure ulcer upon admission and as needed Notes: Wound/Skin Impairment Nursing Diagnoses: Impaired tissue integrity Knowledge deficit related to ulceration/compromised skin integrity Goals: Ulcer/skin breakdown will have a volume reduction of 30% by week 4 Date Initiated: 01/17/2023 Target Resolution Date: 02/14/2023 Goal Status: Active Ulcer/skin breakdown will have a volume reduction of 50% by week 8 Date Initiated: 01/17/2023 Target Resolution Date: 03/14/2023 Goal Status: Active Ulcer/skin breakdown will have a volume reduction of 80% by week 12 Date Initiated: 01/17/2023 Target Resolution Date: 04/11/2023 Goal Status: Active Ulcer/skin breakdown will heal within 14 weeks Date Initiated: 01/17/2023 Target Resolution Date: 04/25/2023 Goal Status: Active Interventions: Assess patient/caregiver ability to obtain necessary supplies Assess patient/caregiver ability to perform ulcer/skin care regimen upon admission and as needed Assess ulceration(s) every visit Provide education on ulcer and skin care Notes: Electronic Signature(s) Signed: 01/24/2023 11:43:30 AM By: Vaughn [name] Signed: 01/24/2023 12:30:31 PM By: Purcell Sniff Entered By: Purcell Sniff on 01/24/2023 10:11:04 -------------------------------------------------------------------------------- Pain Assessment Details Patient Name: Date of Service: Donald [name], EDWA RD L. 01/24/2023 9:30 A M Medical Record Number: [id number] Patient Account Number: [id number] Date of Birth/Sex: Treating RN: [birth year] (78 y.o. M) Gordon, Caitlin Netherland, [place] Pasture L ([id number]) [phone].pdf Page 6 of 9 Primary Care Angela Vazguez: Rilla Baller Other Clinician: Purcell Sniff Referring Leba Tibbitts: Treating Nara Paternoster/Extender: Bethena Andre Rilla Baller Weeks in Treatment: 1 Active  Problems Location of Pain Severity and Description of Pain Patient Has Paino Yes Site Locations Duration of the Pain. Constant / Intermittento Intermittent Rate the pain. Current Pain Level: 3 Character of Pain Describe the Pain: Aching Pain Management and Medication Current Pain Management: Medication: Yes Cold Application: No Rest: No Massage: No Activity: No T.E.N.S.: No Heat Application: No Leg drop or elevation: No Is the Current Pain Management Adequate: Inadequate How does your wound impact your activities of daily livingo Sleep: No Bathing: No Appetite: No Relationship With Others: No Bladder Continence: No Emotions: No Bowel Continence: No Work: No Toileting: No Drive: No Dressing: No Hobbies: No Electronic Signature(s) Signed: 01/24/2023 11:43:30 AM By: Vaughn [name] Signed: 01/24/2023 12:30:31 PM By: Purcell Sniff Entered By: Purcell Sniff on 01/24/2023 09:41:32 -------------------------------------------------------------------------------- Patient/Caregiver Education Details Patient Name: Date of Service: Donald [name] [name] RD L. 1/10/2025andnbsp9:30 A M Medical Record Number: [id number] Patient Account Number: [id number] Date of Birth/Gender: Treating RN: 1945/04/08 (78 y.o. Donald [name] Primary Care Physician: Rilla Baller Other Clinician: Purcell Sniff Referring Physician: Treating Physician/Extender: Bethena Andre Rilla Baller Weeks in Treatment: 1 Hiebert, LOVING CROME ([id number]) [id number].pdf Page 7 of 9 Education Assessment Education Provided To: Patient Education Topics Provided Wound/Skin Impairment: Handouts: Other: continue wound care as directed Methods: Explain/Verbal Responses: State content correctly Electronic Signature(s) Signed: 01/24/2023 12:30:31 PM By: Purcell,  Angie Entered By: Purcell Sniff on 01/24/2023  10:11:29 -------------------------------------------------------------------------------- Wound Assessment Details Patient Name: Date of Service: Donald [name] RD L. 01/24/2023 9:30 A M Medical Record Number: [id number] Patient Account Number: [id number] Date of Birth/Sex: Treating RN: Mar 07, 1945 (78 y.o. Donald [name] Primary Care Camela Wich: Rilla Baller Other Clinician: Purcell Sniff Referring Toree Edling: Treating Devanny Palecek/Extender: Bethena Andre Rilla Baller Weeks in Treatment: 1 Wound Status Wound Number: 1 Primary 3rd degree Burn Etiology: Wound Location: Midline Sacrum Wound Open Wounding Event: Thermal Burn Status: Date Acquired: 12/23/2022 Comorbid Cataracts, Anemia, Asthma, Sleep Apnea, Arrhythmia, Congestive Weeks Of Treatment: 1 History: Heart Failure, Coronary Artery Disease, Deep Vein Thrombosis, Clustered Wound: No Hypertension, Myocardial Infarction, Peripheral Arterial Disease, Type II Diabetes, History of Burn, Gout, Neuropathy Photos Wound Measurements Length: (cm) 4 Width: (cm) 2.4 Depth: (cm) 1.4 Area: (cm) 7.54 Volume: (cm) 10.556 % Reduction in Area: 23.8% % Reduction in Volume: 53.6% Epithelialization: None Wound Description Classification: Full Thickness Without Exposed Support Exudate Amount: Large Nuon, Faron L ([id number]) Exudate Type: Serosanguineous Exudate Color: red, brown Structures Foul Odor After Cleansing: No Slough/Fibrino Yes [id number].pdf Page 8 of 9 Wound Bed Granulation Amount: Medium (34-66%) Exposed Structure Granulation Quality: Red, Pink Fat Layer (Subcutaneous Tissue) Exposed: Yes Necrotic Amount: Medium (34-66%) Necrotic Quality: Adherent Slough Treatment Notes Wound #1 (Sacrum) Wound Laterality: Midline Cleanser Soap and Water Discharge Instruction: Gently cleanse wound with antibacterial soap, rinse and pat dry prior to dressing wounds Wound Cleanser Discharge Instruction:  Wash your hands with soap and water. Remove old dressing, discard into plastic bag and place into trash. Cleanse the wound with Wound Cleanser prior to applying a clean dressing using gauze sponges, not tissues or cotton balls. Do not scrub or use excessive force. Pat dry using gauze sponges, not tissue or cotton balls. Peri-Wound Care Desitin Maximum Strength Ointment 4 (oz) Discharge Instruction: AROUND EDGES Apply around wound edges to avoid maceration Topical Primary Dressing Gauze Discharge Instruction: DAKINS As directed: moistened with Dakins Solution Secondary Dressing (BORDER) Zetuvit Plus SILICONE BORDER Dressing 5x5 (in/in) Discharge Instruction: Please do not put silicone bordered dressings under wraps. Use non-bordered dressing only. Secured With Compression Wrap Compression Stockings Facilities Manager) Signed: 01/24/2023 11:43:30 AM By: Vaughn [name] Signed: 01/24/2023 12:30:31 PM By: Purcell Sniff Entered By: Purcell Sniff on 01/24/2023 09:50:23 -------------------------------------------------------------------------------- Vitals Details Patient Name: Date of Service: Donald [name], EDWA RD L. 01/24/2023 9:30 A M Medical Record Number: [id number] Patient Account Number: [id number] Date of Birth/Sex: Treating RN: [birth year] (78 y.o. Donald [name] Primary Care Benigno Check: Rilla Baller Other Clinician: Purcell Sniff Referring Jobany Montellano: Treating Ismahan Lippman/Extender: Bethena Andre Rilla Baller Weeks in Treatment: 1 Vital Signs Time Taken: 09:38 Temperature (F): 97.5 Height (in): 65 Pulse (bpm): 55 Weight (lbs): 233 Respiratory Rate (breaths/min): 18 Maisano, Joash L ([id number]) [phone].pdf Page 9 of 9 Body Mass Index (BMI): 38.8 Blood Pressure (mmHg): 173/69 Reference Range: 80 - 120 mg / dl Electronic Signature(s) Signed: 01/24/2023 12:30:31 PM By: Purcell Sniff Entered By: Purcell Sniff on 01/24/2023  09:41:27

## 2023-01-24 NOTE — Telephone Encounter (Signed)
Lvm asking pt to call back.  Need to relay Dr. G's message.  
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
## 2023-01-24 NOTE — Telephone Encounter (Addendum)
 Please check with patient on recent hypoglycemia reported by [facility] and ask him to contact endo to review low sugars with them as his insulin dose may need to be adjusted prior to appt later this month.

## 2023-01-24 NOTE — Telephone Encounter (Signed)
 Copied from CRM [id number]. Topic: Clinical - Home Health Verbal Orders >> Jan 23, 2023  4:50 PM Viola FALCON wrote: Caller/Agency: Shani from [facility] Callback Number: [phone] Service Requested: Skilled Nursing Frequency: Please call  Any new concerns about the patient? Yes, low blood sugar low for the past 10 days - 52 lowest

## 2023-01-27 NOTE — Telephone Encounter (Signed)
Lvm asking pt to call back.  Need to relay Dr. G's message.  

## 2023-01-28 ENCOUNTER — Telehealth: Payer: Self-pay | Admitting: Family Medicine

## 2023-01-28 NOTE — Telephone Encounter (Signed)
 Copied from CRM [id number]. Topic: General - Other >> Jan 28, 2023  2:04 PM [place] A wrote: Reason for CRM: Patient called and said he just saw message from [place] and was returning her phone call

## 2023-01-28 NOTE — Telephone Encounter (Signed)
Lvm asking pt to call back.  Need to relay Dr. Synthia Innocent message.  Also, mailing a letter

## 2023-01-28 NOTE — Telephone Encounter (Signed)
 Lvm asking pt to call back. Need to relay Dr Timoteo Expose message.  A letter was mailed today after several attempts to contact pt. (See 01/24/23 phn note.)

## 2023-01-29 ENCOUNTER — Ambulatory Visit: Payer: HMO | Admitting: Internal Medicine

## 2023-01-29 ENCOUNTER — Encounter: Payer: Self-pay | Admitting: Internal Medicine

## 2023-01-29 VITALS — BP 132/80 | HR 77 | Temp 98.0°F

## 2023-01-29 DIAGNOSIS — B029 Zoster without complications: Secondary | ICD-10-CM

## 2023-01-29 HISTORY — DX: Zoster without complications: B02.9

## 2023-01-29 MED ORDER — VALACYCLOVIR HCL 1 G PO TABS: 1000.0000 mg | ORAL_TABLET | Freq: Three times a day (TID) | ORAL | 1 refills | Status: DC

## 2023-01-29 NOTE — Progress Notes (Signed)
 Subjective:    Patient ID: Donald [name], male    DOB: July 11, 1945, 78 y.o.   MRN: [id number]  HPI Here due to left leg pain  Did get over the right leg pain from 2 months ago Since then, had burn in sacrum after burn injury from heating pad Needed hospitalization and debridement Ongoing wound/surgery care---shows picture of stage 3 (?) ulcer with tunneling   Now having pain in left leg---points along lateral left thigh Worried about a blood clot Slight swelling No injury Hurts when sitting ---sense of electrical "shock" at times Seems to be gone this morning--but bad last night and itchy No increased pain with walking  Has tried topical lidocaine --will hurt laterally while he applies it--but then it gets better  Current Outpatient Medications on File Prior to Visit  Medication Sig Dispense Refill   albuterol  (VENTOLIN  HFA) 108 (90 Base) MCG/ACT inhaler Inhale 2 puffs into the lungs every 6 (six) hours as needed for wheezing or shortness of breath. 8 g 0   allopurinol  (ZYLOPRIM ) 300 MG tablet TAKE ONE TABLET BY MOUTH EVERY DAY 90 tablet 4   ALPRAZolam  (XANAX ) 1 MG tablet TAKE 1 TABLET BY MOUTH AT BEDTIME IF NEEDED FOR SLEEP. (Patient taking differently: Take 0.5 mg by mouth at bedtime.) 30 tablet 0   apixaban  (ELIQUIS ) 5 MG TABS tablet Take 1 tablet (5 mg total) by mouth 2 (two) times daily. 60 tablet 0   Cholecalciferol (VITAMIN D3) 25 MCG (1000 UT) CAPS Take 1 capsule (1,000 Units total) by mouth daily. 30 capsule    colchicine  0.6 MG tablet TAKE 1 TABLET BY MOUTH DAILY AS NEEDED FOR GOUT GLARE. ON FIRST DAY OF GOUT FLARE,MAY TAKE 2 TABLETS AT ONCE AS DIRECTED 30 tablet 0   Continuous Glucose Sensor (FREESTYLE LIBRE 2 SENSOR) MISC 1 Device by Does not apply route every 14 (fourteen) days. 6 each 3   cyanocobalamin  (VITAMIN B12) 1000 MCG/ML injection INJECT 1ML INTRAMUSCULARLY EVERY 30 DAYS 1 mL 6   cyclobenzaprine  (FLEXERIL ) 10 MG tablet Take 1 tablet (10 mg total) by mouth 3  (three) times daily as needed for muscle spasms. 30 tablet 0   doxazosin  (CARDURA ) 1 MG tablet TAKE ONE TABLET BY MOUTH TWICE DAILY 180 tablet 2   furosemide  (LASIX ) 80 MG tablet TAKE 1 TABLET BY MOUTH EVERY DAY. MAY TAKE AN EXTRA PILL FOR SWELLING. 90 tablet 1   gabapentin  (NEURONTIN ) 600 MG tablet TAKE TWO TABLETS AT BEDTIME 180 tablet 3   glucose blood (GE100 BLOOD GLUCOSE TEST) test strip TEST FOUR TIMES DAILY 200 each 10   HYDROcodone -acetaminophen  (NORCO) 10-325 MG tablet Take 1 tablet by mouth every 6 (six) hours as needed for moderate pain ((typically once to twice daily if needed)).  30 tablet 0   isosorbide  mononitrate (IMDUR ) 30 MG 24 hr tablet TAKE ONE TABLET BY MOUTH DAILY 90 tablet 2   losartan  (COZAAR ) 25 MG tablet TAKE ONE TABLET BY MOUTH AT BEDTIME 90 tablet 1   lovastatin  (MEVACOR ) 40 MG tablet TAKE TWO TABLETS BY MOUTH AT BEDTIME 180 tablet 1   meclizine (ANTIVERT) 25 MG tablet Take 25 mg by mouth 3 (three) times daily as needed for dizziness.     Naphazoline-Pheniramine (OPCON-A  OP) Place 1 drop into both eyes 3 (three) times daily as needed (itchy eyes.).      nitroGLYCERIN  (NITROSTAT ) 0.4 MG SL tablet Place 1 tablet (0.4 mg total) under the tongue every 5 (five) minutes as needed for chest pain. 25 tablet  3   NOVOLIN  N 100 UNIT/ML injection INJECT 160-170 UNITS TOTAL INTO THE SKIN AT BEDTIME 100 mL 0   NOVOLIN  R 100 UNIT/ML injection INJECT 140 UNITS IN THE MORNING, 140 UNITS IN THE AFTERNOON, AND 160-180 UNITS IN THE EVENING USE BEFORE A MEAL 140 mL 1   Sennosides-Docusate Sodium  (STOOL SOFTENER & LAXATIVE PO) Take 2 tablets by mouth at bedtime.     Syringe/Needle, Disp, (SYRINGE 3CC/22GX1-1/2") 22G X 1-1/2" 3 ML MISC Use to administer monthly b12 shots 50 each 0   No current facility-administered medications on file prior to visit.    Allergies  Allergen Reactions   Codeine  Other (See Comments)    REACTION: chest pain   Pioglitazone Other (See Comments)    REACTION to  Actos: swelling in ankles    Past Medical History:  Diagnosis Date   (HFpEF) heart failure with preserved ejection fraction (HCC)    a. 09/2018 Echo: EF 60-65%. PASP . Mild-mod LAE. Mild MR/TR.   ANEMIA-IRON DEFICIENCY 07/26/2006   Anxiety    ASTHMA 07/26/2006   Asthma    Back pain, chronic    Bell's palsy    CKD (chronic kidney disease), stage III (HCC)    COLONIC POLYPS, HX OF 07/26/2006   COVID-19 virus infection 01/2020   DEPRESSION 03/14/2009   DIABETES MELLITUS, TYPE II 07/26/2006   DISC DISEASE, LUMBAR 10/05/2007   DVT 12/03/2007   DYSLIPIDEMIA 04/13/2009   Dyspnea    when gets up and walks around and back is hurting really bad -only Shortness of breath  then   GERD 07/26/2006   Gout    Heart murmur    History of kidney stones    HYPERTENSION 07/26/2006   INSOMNIA 08/21/2007   Neuropathy    Nonobstructive CAD (coronary artery disease)    a. 2012 Cath: no high grade stenosis; b. 2018 MV: No ischemia. Attenuation artifact.    OBSTRUCTIVE SLEEP APNEA 12/03/2007   Use C-PAP   PERIPHERAL NEUROPATHY 07/26/2006   Pernicious anemia 11/20/2006   PULMONARY EMBOLISM 10/05/2007   Syncope and collapse 10/30/2020   zio patch 11/2020: Predominant sinus bradycardia with an average rate of 54 betas per minute.  Type I second degree heart block was occasionally present w/o significant pauses. Short lived SVT x1 with occasional symptomatic PVCs, ventricular trigeminy present     Vertigo     Past Surgical History:  Procedure Laterality Date   APPENDECTOMY  1968   BACK SURGERY  1977, 06/14/2008   Dr. Gwendlyn Lemmings at [facility] [phone])   CARDIAC CATHETERIZATION     CARPAL TUNNEL RELEASE Right 10/2017   CATARACT EXTRACTION W/PHACO Left 05/30/2014   Procedure: CATARACT EXTRACTION PHACO AND INTRAOCULAR LENS PLACEMENT (IOC);  Surgeon: Steven Dingeldein, MD;  Location: [facility];  Service: Ophthalmology;  Laterality: Left;  US  01:20 AP% 23.7 CDE 31.86   CATARACT EXTRACTION W/PHACO  Right 09/06/2021   Procedure: CATARACT EXTRACTION PHACO AND INTRAOCULAR LENS PLACEMENT (IOC) RIGHT DIABETIC VISION BLUE OMIDRIA  MALYUGIN IRIS HOOKS;  Surgeon: Trudi Fus, MD;  Location: [facility];  Service: Ophthalmology;  Laterality: Right;  14.65 2.11.1   COLONOSCOPY  10/2002   HP, SSA, TA, rpt 3 yrs ([facility])   COLONOSCOPY  01/2005   diverticulosis rpt 5 yrs ([facility]off)    EPIDURAL BLOCK INJECTION Bilateral 06/2021   bilat L5 nerve block with benefit ([place])   INCISION AND DRAINAGE PERIRECTAL ABSCESS N/A 01/08/2023   Procedure: IRRIGATION AND DEBRIDEMENT SACRAL WOUND;  Surgeon: Barrett Lick, MD;  Location: [facility];  Service: General;  Laterality: N/A;   IR NEPHROSTOMY PLACEMENT LEFT  10/19/2018   IR URETERAL STENT LEFT NEW ACCESS W/O SEP NEPHROSTOMY CATH  10/19/2018   LEFT HEART CATH AND CORONARY ANGIOGRAPHY N/A 11/01/2020   Procedure: LEFT HEART CATH AND CORONARY ANGIOGRAPHY;  Surgeon: Sammy Crisp, MD;  Location: [facility];  Service: Cardiovascular;  Laterality: N/A;   LITHOTRIPSY     X 2   NEPHROLITHOTOMY Left 10/19/2018   Procedure: NEPHROLITHOTOMY PERCUTANEOUS;  Surgeon: Trent Frizzle, MD;  Location: [facility];  Service: Urology;  Laterality: Left;  3 HRS   PARATHYROIDECTOMY Right 04/18/2016   PARATHYROIDECTOMY for cyst Rogers Clayman, MD)   SHOULDER ARTHROSCOPY W/ ROTATOR CUFF REPAIR Right    TONSILLECTOMY      Family History  Problem Relation Age of Onset   Cancer Mother        Breast Cancer   Cancer Sister        Breast Cancer   Diabetes Father    Heart disease Father    Diabetes Paternal Grandfather     Social History   Socioeconomic History   Marital status: Married    Spouse name: Not on file   Number of children: Not on file   Years of education: Not on file   Highest education level: Not on file  Occupational History   Occupation: Disabled    Employer: [employer]  Tobacco Use   Smoking status: Never    Passive  exposure: Never   Smokeless tobacco: Never  Vaping Use   Vaping status: Never Used  Substance and Sexual Activity   Alcohol use: No   Drug use: No   Sexual activity: Not on file  Other Topics Concern   Not on file  Social History Narrative   Married   Children   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   Activity is severely limited by medical problems   Diet is "good".   Never a smoker   Alcohol: none         Social Drivers of Corporate investment banker Strain: Medium Risk (02/07/2022)   Overall Financial Resource Strain (CARDIA)    Difficulty of Paying Living Expenses: Somewhat hard  Food Insecurity: No Food Insecurity (01/14/2023)   Hunger Vital Sign    Worried About Running Out of Food in the Last Year: Never true    Ran Out of Food in the Last Year: Never true  Transportation Needs: No Transportation Needs (01/14/2023)   PRAPARE - Administrator, Civil Service (Medical): No    Lack of Transportation (Non-Medical): No  Physical Activity: Inactive (04/20/2019)   Exercise Vital Sign    Days of Exercise per Week: 0 days    Minutes of Exercise per Session: 0 min  Stress: No Stress Concern Present (04/20/2019)   [employer] of Occupational Health - Occupational Stress Questionnaire    Feeling of Stress : Not at all  Social Connections: Not on file  Intimate Partner Violence: Not At Risk (01/14/2023)   Humiliation, Afraid, Rape, and Kick questionnaire    Fear of Current or Ex-Partner: No    Emotionally Abused: No    Physically Abused: No    Sexually Abused: No    Review of Systems No chest pain No SOB    Objective:   Physical Exam Musculoskeletal:     Comments: Fairly normal ROM in left hip No bursa tenderness  Skin:    Comments: Clump of  papulovesicular lesions down from left hip Also with change in sensation along the dermatome (?S1)            Assessment & Plan:

## 2023-01-29 NOTE — Assessment & Plan Note (Signed)
 Fairly classic appearance there Can continue lidocaine  topically Will give valacyclovir  1gm tid x 7 days

## 2023-01-30 ENCOUNTER — Encounter: Payer: Self-pay | Admitting: Emergency Medicine

## 2023-01-30 ENCOUNTER — Other Ambulatory Visit: Payer: Self-pay

## 2023-01-30 ENCOUNTER — Emergency Department: Payer: HMO

## 2023-01-30 ENCOUNTER — Emergency Department
Admission: EM | Admit: 2023-01-30 | Discharge: 2023-01-30 | Disposition: A | Discharge status: Home / Self Care | Payer: HMO | Attending: Emergency Medicine | Admitting: Emergency Medicine

## 2023-01-30 ENCOUNTER — Encounter: Payer: HMO | Admitting: General Surgery

## 2023-01-30 ENCOUNTER — Encounter: Payer: Self-pay | Admitting: Internal Medicine

## 2023-01-30 DIAGNOSIS — W01198A Fall on same level from slipping, tripping and stumbling with subsequent striking against other object, initial encounter: Secondary | ICD-10-CM | POA: Diagnosis not present

## 2023-01-30 DIAGNOSIS — Z23 Encounter for immunization: Secondary | ICD-10-CM | POA: Insufficient documentation

## 2023-01-30 DIAGNOSIS — S0003XA Contusion of scalp, initial encounter: Secondary | ICD-10-CM | POA: Diagnosis not present

## 2023-01-30 DIAGNOSIS — W19XXXA Unspecified fall, initial encounter: Secondary | ICD-10-CM | POA: Diagnosis not present

## 2023-01-30 DIAGNOSIS — I5030 Unspecified diastolic (congestive) heart failure: Secondary | ICD-10-CM | POA: Diagnosis not present

## 2023-01-30 DIAGNOSIS — N189 Chronic kidney disease, unspecified: Secondary | ICD-10-CM | POA: Diagnosis not present

## 2023-01-30 DIAGNOSIS — S0081XA Abrasion of other part of head, initial encounter: Secondary | ICD-10-CM | POA: Diagnosis not present

## 2023-01-30 DIAGNOSIS — M4802 Spinal stenosis, cervical region: Secondary | ICD-10-CM | POA: Diagnosis not present

## 2023-01-30 DIAGNOSIS — M47812 Spondylosis without myelopathy or radiculopathy, cervical region: Secondary | ICD-10-CM | POA: Diagnosis not present

## 2023-01-30 DIAGNOSIS — I130 Hypertensive heart and chronic kidney disease with heart failure and stage 1 through stage 4 chronic kidney disease, or unspecified chronic kidney disease: Secondary | ICD-10-CM | POA: Insufficient documentation

## 2023-01-30 DIAGNOSIS — S0031XA Abrasion of nose, initial encounter: Secondary | ICD-10-CM | POA: Insufficient documentation

## 2023-01-30 DIAGNOSIS — H538 Other visual disturbances: Secondary | ICD-10-CM | POA: Diagnosis not present

## 2023-01-30 DIAGNOSIS — S0990XA Unspecified injury of head, initial encounter: Secondary | ICD-10-CM

## 2023-01-30 DIAGNOSIS — I10 Essential (primary) hypertension: Secondary | ICD-10-CM | POA: Diagnosis not present

## 2023-01-30 DIAGNOSIS — S199XXA Unspecified injury of neck, initial encounter: Secondary | ICD-10-CM | POA: Diagnosis not present

## 2023-01-30 DIAGNOSIS — Z7901 Long term (current) use of anticoagulants: Secondary | ICD-10-CM | POA: Diagnosis not present

## 2023-01-30 MED ORDER — TETANUS-DIPHTH-ACELL PERTUSSIS 5-2.5-18.5 LF-MCG/0.5 IM SUSY
0.5000 mL | PREFILLED_SYRINGE | Freq: Once | INTRAMUSCULAR | Status: AC
  Administered 2023-01-30: 0.5 mL via INTRAMUSCULAR
  Filled 2023-01-30: qty 0.5

## 2023-01-30 MED ORDER — BACITRACIN ZINC 500 UNIT/GM EX OINT
TOPICAL_OINTMENT | Freq: Once | CUTANEOUS | Status: AC
  Filled 2023-01-30: qty 1.8

## 2023-01-30 MED ORDER — TRIAMCINOLONE ACETONIDE 0.1 % EX CREA: 1.0000 | TOPICAL_CREAM | Freq: Two times a day (BID) | CUTANEOUS | 1 refills | Status: DC | PRN

## 2023-01-30 NOTE — ED Triage Notes (Signed)
First Nurse Note:  Pt via [facility] from home. Pt had a mechanical fall on ice this AM. Pt has abrasion on forehead and nose. Pt does take Eliquis. Denies LOC, headache, or vision changes. Pt is being treated for shingles. Pt is A&OX4 and NAD.  172/74 BP  99% on RA 68 HR

## 2023-01-30 NOTE — ED Notes (Signed)
Patient has called for a ride. 

## 2023-01-30 NOTE — ED Provider Triage Note (Signed)
Emergency Medicine Provider Triage Evaluation Note  Donald [name] , a 78 y.o. male  was evaluated in triage.  Pt complains of fall, head injury, on eliquis.  Review of Systems  Positive:  Negative:   Physical Exam  Ht 5\' 4"  (1.626 m)   Wt 108.9 kg   BMI 41.20 kg/m  Gen:   Awake, no distress   Resp:  Normal effort  MSK:   Moves extremities without difficulty  Other:    Medical Decision Making  Medically screening exam initiated at 10:04 AM.  Appropriate orders placed.  Donald [name] was informed that the remainder of the evaluation will be completed by another provider, this initial triage assessment does not replace that evaluation, and the importance of remaining in the ED until their evaluation is complete.     Donald Ghee, PA-C 01/30/23 1005

## 2023-01-30 NOTE — ED Provider Notes (Signed)
[facility] Provider Note    Event Date/Time   First MD Initiated Contact with Patient 01/30/23 1231     (approximate)   History   Fall   HPI  Donald [name] is a 78 y.o. male with a history of HFpEF, DVT, PE on Eliquis, CKD, and hypertension who presents with a head injury.  The patient states that he slipped on ice and fell from standing height forward onto his face.  He did not lose consciousness.  He states that he had a headache initially but this has now resolved.  He has not had any vomiting.  He reports a few small abrasions to his left knuckles but denies any other injuries.  He has no neck or back pain.  He has been able to ambulate since the fall  I reviewed the past medical records.  The patient was most recently seen by Dr. Alphonsus Sias from internal medicine yesterday for an outpatient appointment for evaluation of leg pain.   Physical Exam   Triage Vital Signs: ED Triage Vitals  Encounter Vitals Group     BP 01/30/23 1004 (!) 113/92     Systolic BP Percentile --      Diastolic BP Percentile --      Pulse Rate 01/30/23 1004 (!) 54     Resp 01/30/23 1004 16     Temp 01/30/23 1004 97.8 F (36.6 C)     Temp src --      SpO2 01/30/23 1004 95 %     Weight 01/30/23 0948 240 lb (108.9 kg)     Height 01/30/23 0948 5\' 4"  (1.626 m)     Head Circumference --      Peak Flow --      Pain Score 01/30/23 0948 6     Pain Loc --      Pain Education --      Exclude from Growth Chart --     Most recent vital signs: Vitals:   01/30/23 1004  BP: (!) 113/92  Pulse: (!) 54  Resp: 16  Temp: 97.8 F (36.6 C)  SpO2: 95%     General: Awake, no distress.  CV:  Good peripheral perfusion.  Resp:  Normal effort.  Abd:  No distention.  Other:  EOMI.  PERRLA.  No photophobia.  No facial droop.  Motor intact to all extremities.  No ataxia.  No midline cervical spinal tenderness.  Approximately 8 cm superficial abrasion to the forehead and approximately 3  cm superficial abrasion to the nasal bridge.  No facial bony tenderness or deformity.   ED Results / Procedures / Treatments   Labs (all labs ordered are listed, but only abnormal results are displayed) Labs Reviewed - No data to display   EKG     RADIOLOGY  CT head: I independently viewed and interpreted the images; there is no ICH.  Radiology report indicates no acute abnormality.  CT cervical spine: No acute fracture  CT maxillofacial: No acute fracture  PROCEDURES:  Critical Care performed: No  Procedures   MEDICATIONS ORDERED IN ED: Medications  bacitracin ointment (has no administration in time range)  Tdap (BOOSTRIX) injection 0.5 mL (has no administration in time range)     IMPRESSION / MDM / ASSESSMENT AND PLAN / ED COURSE  I reviewed the triage vital signs and the nursing notes.  78 year old male with PMH as noted above, currently on Eliquis, presents after a mechanical fall from standing height with large facial  abrasions.  He has no other significant trauma other than a few superficial abrasions to the knuckles of the left hand with full range of motion to all joints.  Neurologic exam was nonfocal.  The patient is overall well-appearing.  Differential diagnosis includes, but is not limited to, minor head injury, concussion, ICH.  Patient's presentation is most consistent with acute presentation with potential threat to life or bodily function.  CT head, cervical spine, and maxillofacial are all negative for acute traumatic findings.  The patient has not had a tetanus shot in the last 10 years so I ordered a Tdap.  Neurologic exam is nonfocal.  The patient is alert.  There is no indication for further ED observation.  I have applied bacitracin and dressed the wounds.  The patient is stable for discharge home.  Return precautions provided, and he expresses understanding.   FINAL CLINICAL IMPRESSION(S) / ED DIAGNOSES   Final diagnoses:  Abrasion of face,  initial encounter  Minor head injury, initial encounter     Rx / DC Orders   ED Discharge Orders     None        Note:  This document was prepared using Dragon voice recognition software and may include unintentional dictation errors.    Dionne Bucy, MD 01/30/23 [phone]

## 2023-01-30 NOTE — Discharge Instructions (Signed)
Keep the area clean and dry.  Replace the dressing once or twice daily and apply an antibacterial ointment such as bacitracin or Neosporin.  Return to the ER for new, worsening, or persistent severe headache, pain, bleeding from the wound, pus drainage or any foul odor, or any other new or worsening symptoms that concern you.

## 2023-01-30 NOTE — ED Notes (Signed)
Patient declined discharge vital signs. 

## 2023-01-31 ENCOUNTER — Encounter: Payer: PPO | Admitting: Physician Assistant

## 2023-01-31 ENCOUNTER — Ambulatory Visit: Payer: Medicare Other | Admitting: Medical

## 2023-01-31 DIAGNOSIS — T2134XA Burn of third degree of lower back, initial encounter: Secondary | ICD-10-CM | POA: Diagnosis not present

## 2023-01-31 DIAGNOSIS — T2135XA Burn of third degree of buttock, initial encounter: Secondary | ICD-10-CM | POA: Diagnosis not present
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
## 2023-02-03 ENCOUNTER — Ambulatory Visit: Payer: Medicare Other | Admitting: Internal Medicine

## 2023-02-04 ENCOUNTER — Encounter: Payer: HMO | Admitting: General Surgery

## 2023-02-04 NOTE — Telephone Encounter (Signed)
Seen last week, diagnosed with shingles

## 2023-02-10 ENCOUNTER — Encounter: Payer: PPO | Admitting: Physician Assistant

## 2023-02-10 ENCOUNTER — Other Ambulatory Visit: Payer: Self-pay | Admitting: Cardiovascular Disease

## 2023-02-10 ENCOUNTER — Ambulatory Visit: Payer: Medicare Other | Admitting: Physician Assistant

## 2023-02-10 DIAGNOSIS — T2135XA Burn of third degree of buttock, initial encounter: Secondary | ICD-10-CM | POA: Diagnosis not present

## 2023-02-10 DIAGNOSIS — T2134XA Burn of third degree of lower back, initial encounter: Secondary | ICD-10-CM | POA: Diagnosis not present

## 2023-02-11 NOTE — Telephone Encounter (Signed)
Last office visit: 06/24/22 with plan to f/u 12 months. next office visit: 02/18/23-hosp f/u

## 2023-02-11 NOTE — Telephone Encounter (Signed)
Prescription refill request for Eliquis received. Indication:pad Last office visit:6/24 Scr:1.64  12/24 Age: 78 Weight:108.9  kg  Prescription refilled

## 2023-02-13 ENCOUNTER — Encounter: Payer: Self-pay | Admitting: General Surgery

## 2023-02-13 ENCOUNTER — Ambulatory Visit: Payer: HMO | Admitting: General Surgery

## 2023-02-13 VITALS — BP 144/75 | HR 67 | Temp 98.0°F | Ht 63.0 in | Wt 243.0 lb

## 2023-02-13 DIAGNOSIS — Z09 Encounter for follow-up examination after completed treatment for conditions other than malignant neoplasm: Secondary | ICD-10-CM

## 2023-02-13 DIAGNOSIS — S31000D Unspecified open wound of lower back and pelvis without penetration into retroperitoneum, subsequent encounter: Secondary | ICD-10-CM

## 2023-02-13 NOTE — Patient Instructions (Addendum)
Follow up with wound care as scheduled. When you pack the wound use some saline or distilled water to lightly wet the gauze.    Follow-up with our office as needed.  Please call and ask to speak with a nurse if you develop questions or concerns.
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
## 2023-02-14 ENCOUNTER — Encounter: Payer: Self-pay | Admitting: Pharmacist

## 2023-02-14 NOTE — Progress Notes (Addendum)
Manufacturer Assistance Program (MAP) Application   NOVO ID: [id number]  Manufacturer: [employer]  Medication(s): Novolin R, Novolin N, Ozempic, NovoFine Needle  Patient Portion of Application:  Dec 2024: Completed with patient via online enrollment tool. Submitted.  02/14/23: [facility] CPhT Medication Advocate Team to send copy of insurance card   Income Documentation: N/A - Electronic verification elected.  Provider Portion of Application:  Never received 02/14/23: Re-submitted by PharmD Prescription(s): Included in MAP application. Novofine (100 ea): x5 box Ozempic 1.0 - 2.0 mg pen (x2 ea) Endo Visit 02/19/23:    Application Status:  APPROVED 02/16/2023 to 02/16/2024  Next Steps: [x]    02/27/23: Provider pages uploaded to PCP eFax folder for review/signature -- CLINICAL POOL cc'd  [x]    Upon PCP signature, please fax application + insurance card to [employer] (insurance card included in document uploaded to eFax folder) [x]    Program approval/denial received via fax and documented in chart  Forwarded to [facility] CPhT Patient Advocate Team for future correspondences/re-enrollment.

## 2023-02-17 ENCOUNTER — Encounter: Payer: HMO | Attending: Physician Assistant | Admitting: Physician Assistant

## 2023-02-17 DIAGNOSIS — E1122 Type 2 diabetes mellitus with diabetic chronic kidney disease: Secondary | ICD-10-CM | POA: Diagnosis not present

## 2023-02-17 DIAGNOSIS — X58XXXA Exposure to other specified factors, initial encounter: Secondary | ICD-10-CM | POA: Insufficient documentation

## 2023-02-17 DIAGNOSIS — I7389 Other specified peripheral vascular diseases: Secondary | ICD-10-CM | POA: Insufficient documentation

## 2023-02-17 DIAGNOSIS — Z7901 Long term (current) use of anticoagulants: Secondary | ICD-10-CM | POA: Diagnosis not present

## 2023-02-17 DIAGNOSIS — E11622 Type 2 diabetes mellitus with other skin ulcer: Secondary | ICD-10-CM | POA: Insufficient documentation

## 2023-02-17 DIAGNOSIS — I480 Paroxysmal atrial fibrillation: Secondary | ICD-10-CM | POA: Diagnosis not present

## 2023-02-17 DIAGNOSIS — S0081XA Abrasion of other part of head, initial encounter: Secondary | ICD-10-CM | POA: Diagnosis not present

## 2023-02-17 DIAGNOSIS — T2134XA Burn of third degree of lower back, initial encounter: Secondary | ICD-10-CM | POA: Diagnosis not present

## 2023-02-17 DIAGNOSIS — G4730 Sleep apnea, unspecified: Secondary | ICD-10-CM | POA: Insufficient documentation

## 2023-02-17 DIAGNOSIS — I2510 Atherosclerotic heart disease of native coronary artery without angina pectoris: Secondary | ICD-10-CM | POA: Insufficient documentation

## 2023-02-17 DIAGNOSIS — N183 Chronic kidney disease, stage 3 unspecified: Secondary | ICD-10-CM | POA: Diagnosis not present

## 2023-02-17 DIAGNOSIS — I10 Essential (primary) hypertension: Secondary | ICD-10-CM | POA: Insufficient documentation

## 2023-02-17 DIAGNOSIS — T2135XA Burn of third degree of buttock, initial encounter: Secondary | ICD-10-CM | POA: Insufficient documentation

## 2023-02-18 ENCOUNTER — Ambulatory Visit: Payer: PPO | Admitting: Medical

## 2023-02-18 DIAGNOSIS — E1140 Type 2 diabetes mellitus with diabetic neuropathy, unspecified: Secondary | ICD-10-CM | POA: Diagnosis not present

## 2023-02-18 DIAGNOSIS — M5416 Radiculopathy, lumbar region: Secondary | ICD-10-CM | POA: Diagnosis not present

## 2023-02-18 DIAGNOSIS — M5412 Radiculopathy, cervical region: Secondary | ICD-10-CM | POA: Diagnosis not present

## 2023-02-18 DIAGNOSIS — M51362 Other intervertebral disc degeneration, lumbar region with discogenic back pain and lower extremity pain: Secondary | ICD-10-CM | POA: Diagnosis not present

## 2023-02-18 DIAGNOSIS — T402X5A Adverse effect of other opioids, initial encounter: Secondary | ICD-10-CM | POA: Diagnosis not present

## 2023-02-18 DIAGNOSIS — R296 Repeated falls: Secondary | ICD-10-CM | POA: Diagnosis not present

## 2023-02-18 DIAGNOSIS — Z79891 Long term (current) use of opiate analgesic: Secondary | ICD-10-CM | POA: Diagnosis not present

## 2023-02-18 NOTE — Progress Notes (Deleted)
  Cardiology Office Note:  .   Date:  02/18/2023  ID:  Donald [name], DOB Jan 22, 1945, MRN [id number] PCP: Donald Baller, MD  [facility] Providers Cardiologist:  Evalene Lunger, MD { Click to update primary MD,subspecialty MD or APP then REFRESH:1}   History of Present Illness: .   Donald [name] is a 78 y.o. male with history of HFpEF, nonobstructive coronary artery disease, poorly controlled insulin -dependent diabetes, hyperlipidemia, PAD, DVT and extensive bilateral pulmonary embolism in 2009, CKD stage III, OSA on CPAP, asthmatic bronchitis, morbid obesity, gout, GERD, anemia, history of hematuria, status post parathyroidectomy (right sided, 2018), hypercalcemia, chronic back pain status post surgeries who is being seen for hospital follow-up.  In September 2009 he had left-sided DVT and extensive bilateral PE that was felt to be idiopathic.  He remained on Coumadin until October 2010 with follow-up CTA negative for PE that showed coronary artery calcification and cardiomegaly.  He had subsequent 2012 echocardiogram that showed LVEF 60-70 percent, mild LVH, mild LAE/RAE, and was without wall motion abnormalities.  He had abnormal EKG in 2018 with stress testing that revealed no significant ischemia.  He was found to have paroxysmal A-fib and declined anticoagulation due to cost.  He was also noted to have aortic valve stenosis during October 2022 after an episode of syncope.  Patient presented to [facility] ER January 06, 2019 for for lower back wound.  Noted to have cellulitis infection, tested positive for MRSA.  He was placed on broad-spectrum antibiotics and taken to the OR December 25 for debridement.  Postprocedure he was noted to be in A-fib with slow ventricular rate dropping down as low as 37 while asleep with pauses up to 4.28 seconds on telemetry.  Heart rate was noted to increase with movement.  Due to CHA2DS2-VASc of 5 he was started on Eliquis .  Was not felt pacemaker was  indicated due to adequate chronotropic competence.  Echo showed EF 45 to 50% with global hypokinesis.  Lasix  80 mg was restarted.  Today, Already version   ROS: ***  Studies Reviewed: .        *** Risk Assessment/Calculations:   {Does this patient have ATRIAL FIBRILLATION?[phone]} No BP recorded.  {Refresh Note OR Click here to enter BP  :1}***       Physical Exam:   VS:  There were no vitals taken for this visit.   Wt Readings from Last 3 Encounters:  02/13/23 243 lb (110.2 kg)  01/30/23 240 lb (108.9 kg)  01/20/23 240 lb 4 oz (109 kg)    GEN: Well nourished, well developed in no acute distress NECK: No JVD; No carotid bruits CARDIAC: ***RRR, no murmurs, rubs, gallops RESPIRATORY:  Clear to auscultation without rales, wheezing or rhonchi  ABDOMEN: Soft, non-tender, non-distended EXTREMITIES:  No edema; No deformity   ASSESSMENT AND PLAN: .   ***    {Are you ordering a CV Procedure (e.g. stress test, cath, DCCV, TEE, etc)?   Press F2        :[id number]}  Dispo: ***  Signed, Chevette Fee VEAR Fishman, PA-C

## 2023-02-19 ENCOUNTER — Ambulatory Visit (INDEPENDENT_AMBULATORY_CARE_PROVIDER_SITE_OTHER): Payer: PPO | Admitting: Internal Medicine

## 2023-02-19 ENCOUNTER — Encounter: Payer: Self-pay | Admitting: Internal Medicine

## 2023-02-19 ENCOUNTER — Telehealth: Payer: Self-pay

## 2023-02-19 VITALS — BP 124/70 | HR 63 | Ht 63.0 in | Wt 253.0 lb

## 2023-02-19 DIAGNOSIS — Z794 Long term (current) use of insulin: Secondary | ICD-10-CM | POA: Diagnosis not present

## 2023-02-19 DIAGNOSIS — N1832 Chronic kidney disease, stage 3b: Secondary | ICD-10-CM

## 2023-02-19 DIAGNOSIS — E1165 Type 2 diabetes mellitus with hyperglycemia: Secondary | ICD-10-CM

## 2023-02-19 DIAGNOSIS — E1122 Type 2 diabetes mellitus with diabetic chronic kidney disease: Secondary | ICD-10-CM

## 2023-02-19 DIAGNOSIS — E1142 Type 2 diabetes mellitus with diabetic polyneuropathy: Secondary | ICD-10-CM | POA: Diagnosis not present

## 2023-02-19 LAB — POCT GLYCOSYLATED HEMOGLOBIN (HGB A1C): Hemoglobin A1C: 6.6 % — AB (ref 4.0–5.6)

## 2023-02-19 MED ORDER — SEMAGLUTIDE (1 MG/DOSE) 4 MG/3ML ~~LOC~~ SOPN: 1.0000 mg | PEN_INJECTOR | SUBCUTANEOUS | 3 refills | Status: DC

## 2023-02-19 NOTE — Patient Instructions (Addendum)
 Restart  Ozempic  0.25  mg weekly for 4 weeks, than increase to 0.5 mg until the Ozempic  pen is finished , than start Prescription Ozempic  1 mg weekly  Novolin -N 120 units at  bedtime ( If fasting sugar are below 100, decrease by 10 points and stay on the new dose, until its below 100 mg  etc) Novolin -R  120 units  before brunch and Supper  Novolin -R correctional insulin : ADD extra units on insulin  to your meal-time Novolin - R dose if your blood sugars are higher than 155. Use the scale below to help guide you:   Blood sugar before meal Number of units to inject  Less than 145 0 unit  146 - 160 1 units  161 - 175 2 units  176 - 190 3 units  191 - 205 4 units  206 - 220 5 units  221 - 235 6 units  236 - 250 7 units   251 - 265 8 units      HOW TO TREAT LOW BLOOD SUGARS (Blood sugar LESS THAN 70 MG/DL) Please follow the RULE OF 15 for the treatment of hypoglycemia treatment (when your (blood sugars are less than 70 mg/dL)   STEP 1: Take 15 grams of carbohydrates when your blood sugar is low, which includes:  3-4 GLUCOSE TABS  OR 3-4 OZ OF JUICE OR REGULAR SODA OR ONE TUBE OF GLUCOSE GEL    STEP 2: RECHECK blood sugar in 15 MINUTES STEP 3: If your blood sugar is still low at the 15 minute recheck --> then, go back to STEP 1 and treat AGAIN with another 15 grams of carbohydrates.
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
## 2023-02-19 NOTE — Telephone Encounter (Signed)
 Medication Samples have been provided to the patient.  Drug name: Ozempic         Strength: 0.25mg         Qty: 1 box   LOT: [id number]   Exp.Date: 03/14/2023  Dosing instructions: Inject once weekly   The patient has been instructed regarding the correct time, dose, and frequency of taking this medication, including desired effects and most common side effects.   Taiquan Campanaro L Eduar Kumpf 3:16 PM 02/19/2023

## 2023-02-20 ENCOUNTER — Encounter: Payer: Self-pay | Admitting: Internal Medicine

## 2023-02-24 ENCOUNTER — Encounter: Payer: HMO | Admitting: Physician Assistant

## 2023-02-24 DIAGNOSIS — T2135XA Burn of third degree of buttock, initial encounter: Secondary | ICD-10-CM | POA: Diagnosis not present

## 2023-02-24 DIAGNOSIS — T2134XA Burn of third degree of lower back, initial encounter: Secondary | ICD-10-CM | POA: Diagnosis not present

## 2023-02-25 ENCOUNTER — Ambulatory Visit: Payer: HMO | Admitting: Physician Assistant

## 2023-02-26 ENCOUNTER — Other Ambulatory Visit: Payer: Self-pay | Admitting: Family Medicine

## 2023-02-26 NOTE — Telephone Encounter (Signed)
Rx sent on 01/28/23, #1 mL/6 refills to [facility].   Request denied.

## 2023-02-27 NOTE — Progress Notes (Signed)
Noted.   Printed form and placed in Dr Timoteo Expose box.

## 2023-02-28 NOTE — Progress Notes (Signed)
Filled and in [name]'s box.

## 2023-02-28 NOTE — Progress Notes (Signed)
Next Steps: [x]    02/27/23: Provider pages uploaded to PCP eFax folder for review/signature -- CLINICAL POOL cc'd  [x]    Upon PCP signature, please fax application + insurance card to [employer] (insurance card included in document uploaded to eFax folder)- Placed form and fax confirmation to be scanned into pt's chart. []    Program approval/denial received via fax and documented in chart

## 2023-03-03 ENCOUNTER — Ambulatory Visit: Payer: PPO | Attending: Medical | Admitting: Medical

## 2023-03-03 ENCOUNTER — Encounter: Payer: Self-pay | Admitting: Medical

## 2023-03-03 VITALS — BP 136/72 | HR 59 | Ht 66.0 in | Wt 243.2 lb

## 2023-03-03 DIAGNOSIS — I739 Peripheral vascular disease, unspecified: Secondary | ICD-10-CM | POA: Diagnosis not present

## 2023-03-03 DIAGNOSIS — I4892 Unspecified atrial flutter: Secondary | ICD-10-CM

## 2023-03-03 DIAGNOSIS — I5032 Chronic diastolic (congestive) heart failure: Secondary | ICD-10-CM | POA: Diagnosis not present

## 2023-03-03 DIAGNOSIS — I10 Essential (primary) hypertension: Secondary | ICD-10-CM | POA: Diagnosis not present

## 2023-03-03 DIAGNOSIS — I25118 Atherosclerotic heart disease of native coronary artery with other forms of angina pectoris: Secondary | ICD-10-CM | POA: Diagnosis not present

## 2023-03-03 MED ORDER — LOSARTAN POTASSIUM 25 MG PO TABS: 25.0000 mg | ORAL_TABLET | Freq: Every day | ORAL | 3 refills | Status: DC

## 2023-03-03 NOTE — Patient Instructions (Signed)
Medication Instructions:  Your Physician recommend you continue on your current medication as directed.    *If you need a refill on your cardiac medications before your next appointment, please call your pharmacy*   Lab Work: Your provider would like for you to have following labs drawn today BMeT and CBC.   If you have labs (blood work) drawn today and your tests are completely normal, you will receive your results only by: MyChart Message (if you have MyChart) OR A paper copy in the mail If you have any lab test that is abnormal or we need to change your treatment, we will call you to review the results.   Testing/Procedures:  Dear Donald [name]  You are scheduled for a Cardioversion on Tuesday, February 25 with Dr. Dossie Arbour.  Please arrive at the [facility] Entrance of [facility], [address], [place] [address] at 6:30 AM (This is 1.5 hour(s) prior to your procedure time).  Proceed to the Check-In Desk directly inside the entrance.  Procedure Parking: Use the entrance off of the [address] side of the hospital. Turn right upon entering and follow the driveway to parking that is directly in front of the [facility]. There is no valet parking available at this entrance, however there is an awning directly in front of the [facility] Center for drop off/ pick up for patients.   DIET:  Nothing to eat or drink after midnight except a sip of water with medications (see medication instructions below)  HOLD: Semaglutide (Ozempic, Rybelsus, Wegovy) for 7 days prior to the procedure. Last dose on Thursday, February 13.  Continue taking your anticoagulant (blood thinner): Apixaban (Eliquis).  You will need to continue this after your procedure until you are told by your provider that it is safe to stop.    LABS: Will be drawn today  FYI:  For your safety, and to allow [name] to monitor your vital signs accurately during the surgery/procedure we  request: If you have artificial nails, gel coating, SNS etc, please have those removed prior to your surgery/procedure. Not having the nail coverings /polish removed may result in cancellation or delay of your surgery/procedure.  You must have a responsible person to drive you home and stay in the waiting area during your procedure. Failure to do so could result in cancellation.  Bring your insurance cards.  *Special Note: Every effort is made to have your procedure done on time. Occasionally there are emergencies that occur at the hospital that may cause delays. Please be patient if a delay does occur.    Follow-Up: At [facility], you and your health needs are our priority.  As part of our continuing mission to provide you with exceptional heart care, we have created designated Provider Care Teams.  These Care Teams include your primary Cardiologist (physician) and Advanced Practice Providers (APPs -  Physician Assistants and Nurse Practitioners) who all work together to provide you with the care you need, when you need it.   Your next appointment:   2-3 week(s) after Cardioversion on 03/11/2023  Provider:   You may see Julien Nordmann, MD or one of the following Advanced Practice Providers on your designated Care Team:   Nicolasa Ducking, NP Eula Listen, PA-C Cadence Fransico Michael, PA-C Charlsie Quest, NP Carlos Levering, NP

## 2023-03-03 NOTE — Progress Notes (Signed)
 Cardiology Office Note:  .   Date:  03/03/2023  ID:  CROY DRUMWRIGHT, DOB 1945-08-08, MRN [id number] PCP: [name], MD  [facility] Providers Cardiologist:  Julien Nordmann, MD {  History of Present Illness: .   Donald [name] is a 78 y.o. male with a history of HFpEF, nonobstructive CAD, poorly controlled insulin-dependent diabetes, hyperlipidemia, PAD, DVT and extensive bilateral pulmonary edema 2009, CKD stage III, OSA on CPAP, asthmatic bronchitis, morbid obesity, gout, GERD, anemia, history of hematuria, status post parathyroidectomy on the right side in 2019, hypercalcemia, chronic back pain status post surgeries who is being seen for hospital follow-up.  The patient was to [facility] in late December 2024 with A-fib with slow ventricular response, sacral wound secondary to MRSA, chronic combined systolic and diastolic heart failure.  He was found to have slow Afib with HR as low as 37 while sleeping. He was noted to have pauses of 4.28 seconds on telemetry.  He is not on AV nodal blocking agents.  Due to CHA2DS2-VASc of 5 he was started on Eliquis.  Today, the patient says he is overall feeling good. He denies dizziness, lightheadedness, chest pain, SOB. He sometimes has dependent lower leg edema. He did fall on 01/30/23, he slipped on the ice. He went to the ER and work-up was unremarkable. He is still taking Eliquis. EKG today shows Aflutter,70bpm, and PVCs.  Studies Reviewed: Marland Kitchen   EKG Interpretation Date/Time:  Monday March 03 2023 14:59:37 EST Ventricular Rate:  70 PR Interval:  244 QRS Duration:  136 QT Interval:  486 QTC Calculation: 524 R Axis:   38  Text Interpretation: Aflutter, PVCs Right bundle branch block When compared with ECG of 06-Jan-2023 21:25, Vent. rate has increased BY  23 BPM Questionable change in QRS axis Nonspecific T wave abnormality, improved in Inferior leads T wave inversion now evident in Anterior leads Nonspecific T wave abnormality no  longer evident in Lateral leads QT has lengthened Confirmed by Fransico Michael, Dalaysia Harms ([id number]) on 03/03/2023 3:26:50 PM    Echo 2022 1. Left ventricular ejection fraction, by estimation, is 45 to 50%. The  left ventricle has mildly decreased function. The left ventricle  demonstrates global hypokinesis. There is moderate left ventricular  hypertrophy. Left ventricular diastolic  parameters are consistent with Grade III diastolic dysfunction  (restrictive). Elevated left atrial pressure.   2. Right ventricular systolic function is normal. The right ventricular  size is normal.   3. Left atrial size was mildly dilated.   4. The mitral valve is degenerative. Mild mitral valve regurgitation. No  evidence of mitral stenosis.   5. The aortic valve has an indeterminant number of cusps. There is mild  calcification of the aortic valve. There is moderate thickening of the  aortic valve. Aortic valve regurgitation is trivial. Mild to moderate  aortic valve sclerosis/calcification is  present, without any evidence of aortic stenosis.       Physical Exam:   VS:  BP 136/72 (BP Location: Left Arm, Patient Position: Sitting, Cuff Size: Normal)   Pulse (!) 59   Ht 5\' 6"  (1.676 m)   Wt 243 lb 4 oz (110.3 kg)   SpO2 98%   BMI 39.26 kg/m    Wt Readings from Last 3 Encounters:  03/03/23 243 lb 4 oz (110.3 kg)  02/19/23 253 lb (114.8 kg)  02/13/23 243 lb (110.2 kg)    GEN: Well nourished, well developed in no acute distress NECK: No JVD; No carotid bruits CARDIAC: Reg  IRreg, no murmurs, rubs, gallops RESPIRATORY:  Clear to auscultation without rales, wheezing or rhonchi  ABDOMEN: Soft, non-tender, non-distended EXTREMITIES:  No edema; No deformity   ASSESSMENT AND PLAN: .    Afib/flutter with slow ventricular rate. The patient has h/o PAF. More recently he was found to be in slow Afib and started on Eliquis. Good chronotropic competence when he walks. Today, patient is in aflutter with rates in the 70s.  He is overall asymptomatic. Avoid AV nodal blocking agents. The patient started Eliquis end of December and denies missing doses. I will set him up for cardioversion with Dr. [name].   Sinus Pause In the hospital pause up to 4.28 seconds noted. Would plan for heart monitor post-cardioversion.  Chronic systolic and diastolic CHF Echo in 2022 showed LVEF 45-50%. The patient ie euvolemic on exam. Plan to repeat echo at follow-up.   CAD CT scan showed multivessel coronary calcification. Cardiac cath 2022 showed nonobstructive disease. No ASA given Eliquis. Continue statin.   OSA Continue CPAP  CKD stage 3 Scr 1.42, BUN 32, GFR 48.     Informed Consent   Shared Decision Making/Informed Consent The risks (stroke, cardiac arrhythmias rarely resulting in the need for a temporary or permanent pacemaker, skin irritation or burns and complications associated with conscious sedation including aspiration, arrhythmia, respiratory failure and death), benefits (restoration of normal sinus rhythm) and alternatives of a direct current cardioversion were explained in detail to Mr. Hahne and he agrees to proceed.       Dispo: follow-up 2 weeks post-cardioversion  Signed, Aleyda Gindlesperger David Stall, PA-C

## 2023-03-03 NOTE — Progress Notes (Addendum)
[  x]   Upon PCP signature, please fax application + insurance card to [employer] (insurance card included in document uploaded to eFax folder)- Placed form and fax confirmation to be scanned into pt's chart.- 03/03/23. []    Program approval/denial received via fax and documented in chart

## 2023-03-03 NOTE — Progress Notes (Signed)
Printed and placed in Dr. Sharen Hones box for review.      02/27/23: Provider pages uploaded to PCP eFax folder for review/signature -- CLINICAL POOL cc'd  Received and printed for provider review. 03/03/23 []    Upon PCP signature, please fax application + insurance card to [employer] (insurance card included in document uploaded to eFax folder)- Placed form and fax confirmation to be scanned into pt's chart. []    Program approval/denial received via fax and documented in chart

## 2023-03-04 LAB — CBC
Hematocrit: 38 % (ref 37.5–51.0)
Hemoglobin: 12.3 g/dL — ABNORMAL LOW (ref 13.0–17.7)
MCH: 29.4 pg (ref 26.6–33.0)
MCHC: 32.4 g/dL (ref 31.5–35.7)
MCV: 91 fL (ref 79–97)
Platelets: 185 10*3/uL (ref 150–450)
RBC: 4.18 x10E6/uL (ref 4.14–5.80)
RDW: 15 % (ref 11.6–15.4)
WBC: 7.3 10*3/uL (ref 3.4–10.8)

## 2023-03-04 LAB — BASIC METABOLIC PANEL
BUN/Creatinine Ratio: 28 — ABNORMAL HIGH (ref 10–24)
BUN: 49 mg/dL — ABNORMAL HIGH (ref 8–27)
CO2: 28 mmol/L (ref 20–29)
Calcium: 9.2 mg/dL (ref 8.6–10.2)
Chloride: 99 mmol/L (ref 96–106)
Creatinine, Ser: 1.74 mg/dL — ABNORMAL HIGH (ref 0.76–1.27)
Glucose: 209 mg/dL — ABNORMAL HIGH (ref 70–99)
Potassium: 4.6 mmol/L (ref 3.5–5.2)
Sodium: 141 mmol/L (ref 134–144)
eGFR: 40 mL/min/{1.73_m2} — ABNORMAL LOW (ref >59)

## 2023-03-04 NOTE — Telephone Encounter (Signed)
PAP: Patient assistance application for Novolin R and Ozempic 1 AND 2 has been approved by PAP Companies: [employer] from 02/16/2023 to 02/16/2024. Medication should be delivered to PAP Delivery: Provider's office. For further shipping updates, please [employer] at [phone]. Patient ID is: NO ID    PLEASE BE ADVISED I BELIEVE HIS ENROLLMENT IS FROM 1 CALENDAR YEAR BECAUSE NO INSURANCE.

## 2023-03-06 ENCOUNTER — Encounter: Payer: HMO | Admitting: Physician Assistant

## 2023-03-06 DIAGNOSIS — T2134XA Burn of third degree of lower back, initial encounter: Secondary | ICD-10-CM | POA: Diagnosis not present

## 2023-03-06 DIAGNOSIS — T2135XA Burn of third degree of buttock, initial encounter: Secondary | ICD-10-CM | POA: Diagnosis not present

## 2023-03-09 ENCOUNTER — Other Ambulatory Visit: Payer: Self-pay | Admitting: Cardiovascular Disease

## 2023-03-10 ENCOUNTER — Telehealth: Payer: Self-pay | Admitting: Cardiovascular Disease

## 2023-03-10 NOTE — Telephone Encounter (Signed)
 Spoke with patient and reviewed instructions and medications. He verbalized understanding of all information discussed with no further questions at this time.

## 2023-03-10 NOTE — Telephone Encounter (Signed)
 Pt would like a c/b regarding what medications he is able to take in the morning before procedure. Please advise

## 2023-03-11 ENCOUNTER — Ambulatory Visit: Payer: PPO | Admitting: Anesthesiology

## 2023-03-11 ENCOUNTER — Ambulatory Visit
Admission: RE | Admit: 2023-03-11 | Discharge: 2023-03-11 | Disposition: A | Discharge status: Home / Self Care | Payer: PPO | Attending: Cardiovascular Disease | Admitting: Cardiovascular Disease

## 2023-03-11 ENCOUNTER — Encounter
Admission: RE | Disposition: A | Discharge status: Home / Self Care | Payer: Self-pay | Source: Home / Self Care | Attending: Cardiovascular Disease

## 2023-03-11 ENCOUNTER — Telehealth: Payer: Self-pay

## 2023-03-11 ENCOUNTER — Encounter: Payer: Self-pay | Admitting: Cardiovascular Disease

## 2023-03-11 ENCOUNTER — Other Ambulatory Visit: Payer: Self-pay

## 2023-03-11 DIAGNOSIS — Z7901 Long term (current) use of anticoagulants: Secondary | ICD-10-CM | POA: Insufficient documentation

## 2023-03-11 DIAGNOSIS — D649 Anemia, unspecified: Secondary | ICD-10-CM | POA: Diagnosis not present

## 2023-03-11 DIAGNOSIS — K219 Gastro-esophageal reflux disease without esophagitis: Secondary | ICD-10-CM | POA: Diagnosis not present

## 2023-03-11 DIAGNOSIS — N182 Chronic kidney disease, stage 2 (mild): Secondary | ICD-10-CM | POA: Diagnosis not present

## 2023-03-11 DIAGNOSIS — E785 Hyperlipidemia, unspecified: Secondary | ICD-10-CM | POA: Diagnosis not present

## 2023-03-11 DIAGNOSIS — Z794 Long term (current) use of insulin: Secondary | ICD-10-CM | POA: Diagnosis not present

## 2023-03-11 DIAGNOSIS — I2510 Atherosclerotic heart disease of native coronary artery without angina pectoris: Secondary | ICD-10-CM | POA: Insufficient documentation

## 2023-03-11 DIAGNOSIS — E892 Postprocedural hypoparathyroidism: Secondary | ICD-10-CM | POA: Insufficient documentation

## 2023-03-11 DIAGNOSIS — G8929 Other chronic pain: Secondary | ICD-10-CM | POA: Insufficient documentation

## 2023-03-11 DIAGNOSIS — Z6841 Body Mass Index (BMI) 40.0 and over, adult: Secondary | ICD-10-CM | POA: Insufficient documentation

## 2023-03-11 DIAGNOSIS — I5042 Chronic combined systolic (congestive) and diastolic (congestive) heart failure: Secondary | ICD-10-CM | POA: Diagnosis not present

## 2023-03-11 DIAGNOSIS — E1165 Type 2 diabetes mellitus with hyperglycemia: Secondary | ICD-10-CM | POA: Diagnosis not present

## 2023-03-11 DIAGNOSIS — E1151 Type 2 diabetes mellitus with diabetic peripheral angiopathy without gangrene: Secondary | ICD-10-CM | POA: Insufficient documentation

## 2023-03-11 DIAGNOSIS — Z86718 Personal history of other venous thrombosis and embolism: Secondary | ICD-10-CM | POA: Diagnosis not present

## 2023-03-11 DIAGNOSIS — I4892 Unspecified atrial flutter: Secondary | ICD-10-CM | POA: Diagnosis not present

## 2023-03-11 DIAGNOSIS — R0602 Shortness of breath: Secondary | ICD-10-CM | POA: Diagnosis present

## 2023-03-11 DIAGNOSIS — I483 Typical atrial flutter: Secondary | ICD-10-CM | POA: Diagnosis not present

## 2023-03-11 DIAGNOSIS — G4733 Obstructive sleep apnea (adult) (pediatric): Secondary | ICD-10-CM | POA: Diagnosis not present

## 2023-03-11 DIAGNOSIS — I480 Paroxysmal atrial fibrillation: Secondary | ICD-10-CM | POA: Diagnosis not present

## 2023-03-11 DIAGNOSIS — E1122 Type 2 diabetes mellitus with diabetic chronic kidney disease: Secondary | ICD-10-CM | POA: Diagnosis not present

## 2023-03-11 DIAGNOSIS — I4510 Unspecified right bundle-branch block: Secondary | ICD-10-CM | POA: Diagnosis not present

## 2023-03-11 HISTORY — PX: CARDIOVERSION: SHX1299

## 2023-03-11 SURGERY — CARDIOVERSION
Anesthesia: General

## 2023-03-11 MED ORDER — PHENYLEPHRINE 80 MCG/ML (10ML) SYRINGE FOR IV PUSH (FOR BLOOD PRESSURE SUPPORT)
PREFILLED_SYRINGE | INTRAVENOUS | Status: AC
  Filled 2023-03-11: qty 20

## 2023-03-11 MED ORDER — SODIUM CHLORIDE 0.9% FLUSH
3.0000 mL | Freq: Two times a day (BID) | INTRAVENOUS | Status: DC
  Administered 2023-03-11: 300 mL via INTRAVENOUS

## 2023-03-11 MED ORDER — LIDOCAINE HCL URETHRAL/MUCOSAL 2 % EX GEL
CUTANEOUS | Status: AC
  Filled 2023-03-11: qty 6

## 2023-03-11 MED ORDER — ETOMIDATE 2 MG/ML IV SOLN
INTRAVENOUS | Status: AC
  Filled 2023-03-11: qty 10

## 2023-03-11 MED ORDER — LIDOCAINE HCL (PF) 2 % IJ SOLN
INTRAMUSCULAR | Status: AC
  Filled 2023-03-11: qty 5

## 2023-03-11 MED ORDER — LIDOCAINE HCL (CARDIAC) PF 100 MG/5ML IV SOSY
PREFILLED_SYRINGE | INTRAVENOUS | Status: DC | PRN
  Administered 2023-03-11: 80 mg via INTRAVENOUS

## 2023-03-11 MED ORDER — PROPOFOL 500 MG/50ML IV EMUL
INTRAVENOUS | Status: DC | PRN
  Administered 2023-03-11: 70 mg via INTRAVENOUS

## 2023-03-11 MED ORDER — FENTANYL CITRATE (PF) 100 MCG/2ML IJ SOLN
INTRAMUSCULAR | Status: AC
  Filled 2023-03-11: qty 2

## 2023-03-11 MED ORDER — SODIUM CHLORIDE 0.9% FLUSH: 3.0000 mL | INTRAVENOUS | Status: DC | PRN

## 2023-03-11 MED ORDER — PROPOFOL 10 MG/ML IV BOLUS
INTRAVENOUS | Status: AC
  Filled 2023-03-11: qty 20

## 2023-03-11 MED ORDER — EPHEDRINE 5 MG/ML INJ
INTRAVENOUS | Status: AC
  Filled 2023-03-11: qty 5

## 2023-03-11 NOTE — Transfer of Care (Signed)
 Immediate Anesthesia Transfer of Care Note  Patient: Donald [name]  Procedure(s) Performed: CARDIOVERSION  Patient Location: PACU  Anesthesia Type:MAC  Level of Consciousness: patient cooperative and responds to stimulation  Airway & Oxygen Therapy: Patient Spontanous Breathing and Patient connected to nasal cannula oxygen  Post-op Assessment: Report given to RN and Post -op Vital signs reviewed and stable  Post vital signs: stable  Last Vitals:  Vitals Value Taken Time  BP 134/56   Temp    Pulse 73   Resp 16   SpO2 99     Last Pain:  Vitals:   03/11/23 0738  TempSrc: Oral  PainSc: 0-No pain         Complications: No notable events documented.

## 2023-03-11 NOTE — Anesthesia Preprocedure Evaluation (Signed)
 Anesthesia Evaluation  Patient identified by MRN, date of birth, ID band Patient awake    Reviewed: Allergy & Precautions, NPO status , Patient's Chart, lab work & pertinent test results  History of Anesthesia Complications Negative for: history of anesthetic complications  Airway Mallampati: III  TM Distance: <3 FB Neck ROM: full    Dental  (+) Chipped   Pulmonary asthma , sleep apnea    Pulmonary exam normal        Cardiovascular Exercise Tolerance: Good hypertension, + CAD and + Peripheral Vascular Disease  + dysrhythmias Atrial Fibrillation  Rhythm:irregular     Neuro/Psych  PSYCHIATRIC DISORDERS       Neuromuscular disease    GI/Hepatic Neg liver ROS,GERD  Controlled,,  Endo/Other  negative endocrine ROSdiabetes, Type 2    Renal/GU Renal disease  negative genitourinary   Musculoskeletal   Abdominal   Peds  Hematology negative hematology ROS (+)   Anesthesia Other Findings Past Medical History: No date: (HFpEF) heart failure with preserved ejection fraction (HCC)     Comment:  a. 09/2018 Echo: EF 60-65%. PASP . Mild-mod LAE.               Mild MR/TR. 07/26/2006: ANEMIA-IRON DEFICIENCY No date: Anxiety 07/26/2006: ASTHMA No date: Asthma No date: Back pain, chronic No date: Bell's palsy No date: CKD (chronic kidney disease), stage III (HCC) 07/26/2006: COLONIC POLYPS, HX OF 01/2020: COVID-19 virus infection 03/14/2009: DEPRESSION 07/26/2006: DIABETES MELLITUS, TYPE II 10/05/2007: DISC DISEASE, LUMBAR 12/03/2007: DVT 04/13/2009: DYSLIPIDEMIA No date: Dyspnea     Comment:  when gets up and walks around and back is hurting really              bad -only Shortness of breath  then 07/26/2006: GERD No date: Gout No date: Heart murmur No date: History of kidney stones 07/26/2006: HYPERTENSION 08/21/2007: INSOMNIA No date: Neuropathy No date: Nonobstructive CAD (coronary artery disease)      Comment:  a. 2012 Cath: no high grade stenosis; b. 2018 MV: No               ischemia. Attenuation artifact.  12/03/2007: OBSTRUCTIVE SLEEP APNEA     Comment:  Use C-PAP 07/26/2006: PERIPHERAL NEUROPATHY 11/20/2006: Pernicious anemia 10/05/2007: PULMONARY EMBOLISM 10/30/2020: Syncope and collapse     Comment:  zio patch 11/2020: Predominant sinus bradycardia with an              average rate of 54 betas per minute. Type I second               degree heart block was occasionally present w/o               significant pauses. Short lived SVT x1 with occasional               symptomatic PVCs, ventricular trigeminy present   No date: Vertigo  Past Surgical History: 1968: APPENDECTOMY 1977, 06/14/2008: BACK SURGERY     Comment:  Dr. Dutch Quint at [facility] (06/10) No date: CARDIAC CATHETERIZATION 10/2017: CARPAL TUNNEL RELEASE; Right 05/30/2014: CATARACT EXTRACTION W/PHACO; Left     Comment:  Procedure: CATARACT EXTRACTION PHACO AND INTRAOCULAR               LENS PLACEMENT (IOC);  Surgeon: Sallee Lange, MD;                Location: [facility] ORS;  Service: Ophthalmology;  Laterality:  Left;  [name] 01:20 AP% 23.7 CDE 31.86 09/06/2021: CATARACT EXTRACTION W/PHACO; Right     Comment:  Procedure: CATARACT EXTRACTION PHACO AND INTRAOCULAR               LENS PLACEMENT (IOC) RIGHT DIABETIC VISION BLUE OMIDRIA               MALYUGIN IRIS HOOKS;  Surgeon: Estanislado Pandy,               MD;  Location: [facility];  Service:               Ophthalmology;  Laterality: Right;  14.65 2.11.1 10/2002: COLONOSCOPY     Comment:  HP, SSA, TA, rpt 3 yrs (Medoff) 01/2005: COLONOSCOPY     Comment:  diverticulosis rpt 5 yrs (Medoff)  06/2021: EPIDURAL BLOCK INJECTION; Bilateral     Comment:  bilat L5 nerve block with benefit (Ramos) 01/08/2023: INCISION AND DRAINAGE PERIRECTAL ABSCESS; N/A     Comment:  Procedure: IRRIGATION AND DEBRIDEMENT SACRAL WOUND;                Surgeon:  Kandis Cocking, MD;  Location: [facility] ORS;                Service: General;  Laterality: N/A; 10/19/2018: IR NEPHROSTOMY PLACEMENT LEFT 10/19/2018: IR URETERAL STENT LEFT NEW ACCESS W/O SEP NEPHROSTOMY CATH 11/01/2020: LEFT HEART CATH AND CORONARY ANGIOGRAPHY; N/A     Comment:  Procedure: LEFT HEART CATH AND CORONARY ANGIOGRAPHY;                Surgeon: Yvonne Kendall, MD;  Location: [facility] INVASIVE               CV LAB;  Service: Cardiovascular;  Laterality: N/A; No date: LITHOTRIPSY     Comment:  X 2 10/19/2018: NEPHROLITHOTOMY; Left     Comment:  Procedure: NEPHROLITHOTOMY PERCUTANEOUS;  Surgeon:               Marcine Matar, MD;  Location: [facility];  Service:               Urology;  Laterality: Left;  3 HRS 04/18/2016: PARATHYROIDECTOMY; Right     Comment:  PARATHYROIDECTOMY for cyst Bud Face, MD) No date: SHOULDER ARTHROSCOPY W/ ROTATOR CUFF REPAIR; Right No date: TONSILLECTOMY     Reproductive/Obstetrics negative OB ROS                             Anesthesia Physical Anesthesia Plan  ASA: 3  Anesthesia Plan: General   Post-op Pain Management:    Induction: Intravenous  PONV Risk Score and Plan: Propofol infusion and TIVA  Airway Management Planned: Natural Airway and Nasal Cannula  Additional Equipment:   Intra-op Plan:   Post-operative Plan:   Informed Consent: I have reviewed the patients History and Physical, chart, labs and discussed the procedure including the risks, benefits and alternatives for the proposed anesthesia with the patient or authorized representative who has indicated his/her understanding and acceptance.     Dental Advisory Given  Plan Discussed with: Anesthesiologist, CRNA and Surgeon  Anesthesia Plan Comments: (Patient consented for risks of anesthesia including but not limited to:  - adverse reactions to medications - risk of airway placement if required - damage to eyes, teeth, lips or other oral  mucosa - nerve damage due to positioning  - sore throat or hoarseness - Damage to heart, brain,  nerves, lungs, other parts of body or loss of life  Patient voiced understanding and assent.)       Anesthesia Quick Evaluation

## 2023-03-11 NOTE — H&P (Addendum)

## 2023-03-11 NOTE — Telephone Encounter (Signed)
 Received pt's med shipment of Ozempic 1 mg (2 boxes total) and 8 mg (2 boxes total).   Spoke with pt notifying him of above med shipment. Pt verbalizes understanding.    [Placed Ozempic (4 boxes total) in 2nd refrigerator, top shelf.]

## 2023-03-11 NOTE — CV Procedure (Signed)
 Cardioversion procedure note For typical atrial flutter.  Procedure Details:  Consent: Risks of procedure as well as the alternatives and risks of each were explained to the (patient/caregiver).  Consent for procedure obtained.  Time Out: Verified patient identification, verified procedure, site/side was marked, verified correct patient position, special equipment/implants available, medications/allergies/relevent history reviewed, required imaging and test results available.  Performed  Patient placed on cardiac monitor, pulse oximetry, supplemental oxygen as necessary.   Sedation given: propofol IV, Dr. Randa Ngo  Pacer pads placed anterior and posterior chest.   Cardioverted 1 time(s).   Cardioverted at  150 J. Synchronized biphasic Converted to NSR   Evaluation: Findings: Post procedure EKG shows: NSR Complications: None Patient did tolerate procedure well.  Time Spent Directly with the Patient:  45 minutes   Dossie Arbour, M.D., Ph.D.

## 2023-03-12 ENCOUNTER — Encounter: Payer: Self-pay | Admitting: Cardiovascular Disease

## 2023-03-12 NOTE — Anesthesia Postprocedure Evaluation (Signed)
 Anesthesia Post Note  Patient: Donald [name]  Procedure(s) Performed: CARDIOVERSION  Patient location during evaluation: Specials Recovery Anesthesia Type: General Level of consciousness: awake and alert Pain management: pain level controlled Vital Signs Assessment: post-procedure vital signs reviewed and stable Respiratory status: spontaneous breathing, nonlabored ventilation, respiratory function stable and patient connected to nasal cannula oxygen Cardiovascular status: blood pressure returned to baseline and stable Postop Assessment: no apparent nausea or vomiting Anesthetic complications: no   No notable events documented.   Last Vitals:  Vitals:   03/11/23 0815 03/11/23 0830  BP: 139/62 (!) 142/60  Pulse: 69 64  Resp: 14 14  Temp:    SpO2: 98% 97%    Last Pain:  Vitals:   03/11/23 0830  TempSrc:   PainSc: 0-No pain                 Cleda Mccreedy Zeb Rawl

## 2023-03-13 DIAGNOSIS — E1122 Type 2 diabetes mellitus with diabetic chronic kidney disease: Secondary | ICD-10-CM | POA: Diagnosis not present

## 2023-03-13 DIAGNOSIS — L03317 Cellulitis of buttock: Secondary | ICD-10-CM | POA: Diagnosis not present

## 2023-03-13 DIAGNOSIS — B9562 Methicillin resistant Staphylococcus aureus infection as the cause of diseases classified elsewhere: Secondary | ICD-10-CM | POA: Diagnosis not present

## 2023-03-13 DIAGNOSIS — I130 Hypertensive heart and chronic kidney disease with heart failure and stage 1 through stage 4 chronic kidney disease, or unspecified chronic kidney disease: Secondary | ICD-10-CM | POA: Diagnosis not present

## 2023-03-13 DIAGNOSIS — T2135XA Burn of third degree of buttock, initial encounter: Secondary | ICD-10-CM | POA: Diagnosis not present

## 2023-03-13 DIAGNOSIS — N183 Chronic kidney disease, stage 3 unspecified: Secondary | ICD-10-CM | POA: Diagnosis not present

## 2023-03-13 DIAGNOSIS — I5040 Unspecified combined systolic (congestive) and diastolic (congestive) heart failure: Secondary | ICD-10-CM | POA: Diagnosis not present

## 2023-03-13 DIAGNOSIS — I2510 Atherosclerotic heart disease of native coronary artery without angina pectoris: Secondary | ICD-10-CM | POA: Diagnosis not present

## 2023-03-13 DIAGNOSIS — B955 Unspecified streptococcus as the cause of diseases classified elsewhere: Secondary | ICD-10-CM | POA: Diagnosis not present

## 2023-03-13 DIAGNOSIS — I700 Atherosclerosis of aorta: Secondary | ICD-10-CM | POA: Diagnosis not present

## 2023-03-13 DIAGNOSIS — M47815 Spondylosis without myelopathy or radiculopathy, thoracolumbar region: Secondary | ICD-10-CM | POA: Diagnosis not present

## 2023-03-13 DIAGNOSIS — I482 Chronic atrial fibrillation, unspecified: Secondary | ICD-10-CM | POA: Diagnosis not present

## 2023-03-13 DIAGNOSIS — T2124XA Burn of second degree of lower back, initial encounter: Secondary | ICD-10-CM | POA: Diagnosis not present

## 2023-03-14 NOTE — Telephone Encounter (Signed)
 Noted.

## 2023-03-14 NOTE — Telephone Encounter (Signed)
 Patient wife came by and picked up medication.

## 2023-03-17 ENCOUNTER — Ambulatory Visit: Payer: Medicare Other | Admitting: Internal Medicine

## 2023-03-20 ENCOUNTER — Encounter: Payer: PPO | Attending: Physician Assistant | Admitting: Physician Assistant

## 2023-03-20 DIAGNOSIS — E1122 Type 2 diabetes mellitus with diabetic chronic kidney disease: Secondary | ICD-10-CM | POA: Insufficient documentation

## 2023-03-20 DIAGNOSIS — S0081XA Abrasion of other part of head, initial encounter: Secondary | ICD-10-CM | POA: Diagnosis not present

## 2023-03-20 DIAGNOSIS — T2134XA Burn of third degree of lower back, initial encounter: Secondary | ICD-10-CM | POA: Diagnosis not present

## 2023-03-20 DIAGNOSIS — Z7901 Long term (current) use of anticoagulants: Secondary | ICD-10-CM | POA: Insufficient documentation

## 2023-03-20 DIAGNOSIS — I2510 Atherosclerotic heart disease of native coronary artery without angina pectoris: Secondary | ICD-10-CM | POA: Insufficient documentation

## 2023-03-20 DIAGNOSIS — N183 Chronic kidney disease, stage 3 unspecified: Secondary | ICD-10-CM | POA: Diagnosis not present

## 2023-03-20 DIAGNOSIS — I480 Paroxysmal atrial fibrillation: Secondary | ICD-10-CM | POA: Insufficient documentation

## 2023-03-20 DIAGNOSIS — E11622 Type 2 diabetes mellitus with other skin ulcer: Secondary | ICD-10-CM | POA: Diagnosis not present

## 2023-03-20 DIAGNOSIS — T2135XA Burn of third degree of buttock, initial encounter: Secondary | ICD-10-CM | POA: Diagnosis not present

## 2023-03-20 DIAGNOSIS — E1151 Type 2 diabetes mellitus with diabetic peripheral angiopathy without gangrene: Secondary | ICD-10-CM | POA: Insufficient documentation

## 2023-03-20 DIAGNOSIS — I129 Hypertensive chronic kidney disease with stage 1 through stage 4 chronic kidney disease, or unspecified chronic kidney disease: Secondary | ICD-10-CM | POA: Diagnosis not present

## 2023-03-27 ENCOUNTER — Encounter: Admitting: Physician Assistant

## 2023-03-27 DIAGNOSIS — T2134XA Burn of third degree of lower back, initial encounter: Secondary | ICD-10-CM | POA: Diagnosis not present

## 2023-03-27 DIAGNOSIS — E11622 Type 2 diabetes mellitus with other skin ulcer: Secondary | ICD-10-CM | POA: Diagnosis not present

## 2023-03-27 NOTE — Progress Notes (Unsigned)
 Cardiology Office Note:  .   Date:  03/28/2023  ID:  Donald [name], DOB 14-Jul-1945, MRN [id number] PCP: [name], MD  [facility] Providers Cardiologist:  Julien Nordmann, MD     History of Present Illness: .   Donald [name] is a 78 y.o. male with a history of HFpEF, persistent Afib, nonobstructive CAD, poorly controlled insulin-dependent diabetes, hyperlipidemia, PAD, DVT and extensive bilateral pulmonary edema 2009, CKD stage III, OSA on CPAP, asthmatic bronchitis, morbid obesity, gout, GERD, anemia, history of hematuria, status post parathyroidectomy on the right side in 2019, hypercalcemia, chronic back pain status post surgeries who is being seen for cardioversion follow-up.   The patient was to [facility] in late December 2024 with A-fib with slow ventricular response, sacral wound secondary to MRSA, chronic combined systolic and diastolic heart failure.  He was found to have slow Afib with HR as low as 37 while sleeping. He was noted to have pauses of 4.28 seconds on telemetry.  He is not on AV nodal blocking agents.  Due to CHA2DS2-VASc of 5 he was started on Eliquis.  He was seen 03/03/23 and was in aflutter with rates in the 70s and he was set up for cardioversion. He underwent DCCV 03/11/23 with conversion to NSR.    Today, the patient is back in Afib with slow ventricular response. He denies any symptoms. No chest pain , SOB, lightheadedness, dizziness. Has been taking Eliquis. Recent labs showed dehydration.    Studies Reviewed: Marland Kitchen   EKG Interpretation Date/Time:  Friday March 28 2023 11:10:51 EDT Ventricular Rate:  58 PR Interval:    QRS Duration:  164 QT Interval:  492 QTC Calculation: 482 R Axis:   5  Text Interpretation: Atrial fibrillation with slow ventricular response with premature ventricular or aberrantly conducted complexes Right bundle branch block When compared with ECG of 11-Mar-2023 07:54, PREVIOUS ECG IS PRESENT Confirmed by Fransico Michael, Bobbi Kozakiewicz  [phone]) on 03/28/2023 11:13:39 AM    Echo 2022 1. Left ventricular ejection fraction, by estimation, is 45 to 50%. The  left ventricle has mildly decreased function. The left ventricle  demonstrates global hypokinesis. There is moderate left ventricular  hypertrophy. Left ventricular diastolic  parameters are consistent with Grade III diastolic dysfunction  (restrictive). Elevated left atrial pressure.   2. Right ventricular systolic function is normal. The right ventricular  size is normal.   3. Left atrial size was mildly dilated.   4. The mitral valve is degenerative. Mild mitral valve regurgitation. No  evidence of mitral stenosis.   5. The aortic valve has an indeterminant number of cusps. There is mild  calcification of the aortic valve. There is moderate thickening of the  aortic valve. Aortic valve regurgitation is trivial. Mild to moderate  aortic valve sclerosis/calcification is  present, without any evidence of aortic stenosis.       Physical Exam:   VS:  BP 130/77 (BP Location: Left Arm, Patient Position: Sitting, Cuff Size: Normal)   Pulse (!) 58   Ht 5\' 5"  (1.651 m)   Wt 238 lb 12.8 oz (108.3 kg)   SpO2 99%   BMI 39.74 kg/m    Wt Readings from Last 3 Encounters:  03/28/23 238 lb 12.8 oz (108.3 kg)  03/11/23 263 lb 14.4 oz (119.7 kg)  03/03/23 243 lb 4 oz (110.3 kg)    GEN: Well nourished, well developed in no acute distress NECK: No JVD; No carotid bruits CARDIAC: Irreg Irreg, bradycardia, no murmurs, rubs, gallops RESPIRATORY:  Clear to auscultation without rales, wheezing or rhonchi  ABDOMEN: Soft, non-tender, non-distended EXTREMITIES:  No edema; No deformity   ASSESSMENT AND PLAN: .    Aflutter with slow ventricular rate The patient has a h/o PAF. He was found to be in slow Afib 12/2022 during an admission and he was started on Eliquis. He underwent successful cardioversion 03/11/23, but unfortunately he is back in slow Afib today. Avoid AV nodal blocking  agents. I will get a 2 week heart monitor and refer to EP for further recommendations. I will update Mag and TSH.   Sinus pause In the hospital the patient was noted to have a pause of 4.28 seconds. I will check a 2 week heart monitor as above.   Chronic systolic and diastolic CHF Echo in 2022 showed LVEF 45-50%, The patient is euvolemic on exam. I will update an echo. The patient is euvolemic on exam.   CAD Cardiac cath in 2022 showed nonobstructive CAD. No ASA given Eliquis. Continue statin therapy.   OSA Continue CPAP  CKD stage 3 Recent labs showed dehydration.  He takes lasix 80mg  daily. Good hydration was recommended.     Dispo: Follow-up in 4 months  Signed, Laneisha Mino David Stall, PA-C

## 2023-03-28 ENCOUNTER — Ambulatory Visit: Payer: PPO | Attending: Medical | Admitting: Medical

## 2023-03-28 ENCOUNTER — Other Ambulatory Visit: Payer: Self-pay | Admitting: Family Medicine

## 2023-03-28 ENCOUNTER — Ambulatory Visit

## 2023-03-28 ENCOUNTER — Encounter: Payer: Self-pay | Admitting: Medical

## 2023-03-28 VITALS — BP 130/77 | HR 58 | Ht 65.0 in | Wt 238.8 lb

## 2023-03-28 DIAGNOSIS — I4819 Other persistent atrial fibrillation: Secondary | ICD-10-CM

## 2023-03-28 DIAGNOSIS — E538 Deficiency of other specified B group vitamins: Secondary | ICD-10-CM

## 2023-03-28 DIAGNOSIS — E1165 Type 2 diabetes mellitus with hyperglycemia: Secondary | ICD-10-CM

## 2023-03-28 DIAGNOSIS — I480 Paroxysmal atrial fibrillation: Secondary | ICD-10-CM

## 2023-03-28 DIAGNOSIS — I5032 Chronic diastolic (congestive) heart failure: Secondary | ICD-10-CM | POA: Diagnosis not present

## 2023-03-28 DIAGNOSIS — I455 Other specified heart block: Secondary | ICD-10-CM

## 2023-03-28 DIAGNOSIS — N183 Chronic kidney disease, stage 3 unspecified: Secondary | ICD-10-CM | POA: Diagnosis not present

## 2023-03-28 DIAGNOSIS — I25118 Atherosclerotic heart disease of native coronary artery with other forms of angina pectoris: Secondary | ICD-10-CM

## 2023-03-28 DIAGNOSIS — I739 Peripheral vascular disease, unspecified: Secondary | ICD-10-CM | POA: Diagnosis not present

## 2023-03-28 DIAGNOSIS — G4733 Obstructive sleep apnea (adult) (pediatric): Secondary | ICD-10-CM | POA: Diagnosis not present

## 2023-03-28 DIAGNOSIS — I5042 Chronic combined systolic (congestive) and diastolic (congestive) heart failure: Secondary | ICD-10-CM | POA: Diagnosis not present

## 2023-03-28 NOTE — Telephone Encounter (Unsigned)
 Copied from CRM [id number]. Topic: Clinical - Medication Refill >> Mar 28, 2023  1:36 PM Elizebeth Brooking wrote: Most Recent Primary Care Visit:  Provider: Tillman Abide I  Department: [facility]  Visit Type: OFFICE VISIT  Date: 01/29/2023  Medication: Continuous Glucose Sensor (FREESTYLE LIBRE 2 SENSOR) MISC  Has the patient contacted their pharmacy? Yes (Agent: If no, request that the patient contact the pharmacy for the refill. If patient does not wish to contact the pharmacy document the reason why and proceed with request.) (Agent: If yes, when and what did the pharmacy advise?)  Is this the correct pharmacy for this prescription? Yes If no, delete pharmacy and type the correct one.  This is the patient's preferred pharmacy:   [facility] [address] [place] - [address] [place] [address] Phone: [phone] Fax: [phone]    Has the prescription been filled recently? No  Is the patient out of the medication? Yes  Has the patient been seen for an appointment in the last year OR does the patient have an upcoming appointment? Yes  Can we respond through MyChart? Yes  Agent: Please be advised that Rx refills may take up to 3 business days. We ask that you follow-up with your pharmacy.

## 2023-03-28 NOTE — Patient Instructions (Addendum)
 Medication Instructions:  The current medical regimen is effective;  continue present plan and medications as directed. Please refer to the Current Medication list given to you today.   *If you need a refill on your cardiac medications before your next appointment, please call your pharmacy*   Lab Work: Your provider would like for you to have following labs drawn today MAG, TSH.   If you have labs (blood work) drawn today and your tests are completely normal, you will receive your results only by: MyChart Message (if you have MyChart) OR A paper copy in the mail If you have any lab test that is abnormal or we need to change your treatment, we will call you to review the results.   Testing/Procedures: Your physician has requested that you have an echocardiogram. Echocardiography is a painless test that uses sound waves to create images of your heart. It provides your doctor with information about the size and shape of your heart and how well your heart's chambers and valves are working.   You may receive an ultrasound enhancing agent through an IV if needed to better visualize your heart during the echo. This procedure takes approximately one hour.  There are no restrictions for this procedure.  This will take place at [address] [facility] [address], [place] [address]  Please note: We ask at that you not bring children with you during ultrasound (echo/ vascular) testing. Due to room size and safety concerns, children are not allowed in the ultrasound rooms during exams. Our front office staff cannot provide observation of children in our lobby area while testing is being conducted. An adult accompanying a patient to their appointment will only be allowed in the ultrasound room at the discretion of the ultrasound technician under special circumstances. We apologize for any inconvenience.    ZIO XT- Long Term Monitor Instructions  Your physician has requested you wear a  ZIO patch monitor for 14 days.  This is a single patch monitor. [employer] supplies one patch monitor per enrollment. Additional stickers are not available. Please do not apply patch if you will be having a Nuclear Stress Test,  Echocardiogram, Cardiac CT, MRI, or Chest Xray during the period you would be wearing the  monitor. The patch cannot be worn during these tests. You cannot remove and re-apply the  ZIO XT patch monitor.  Your ZIO patch monitor will be mailed 3 day USPS to your address on file. It may take 3-5 days  to receive your monitor after you have been enrolled.  Once you have received your monitor, please review the enclosed instructions. Your monitor  has already been registered assigning a specific monitor serial # to you.  Billing and Patient Assistance Program Information  We have supplied [employer] with any of your insurance information on file for billing purposes. [employer] offers a sliding scale Patient Assistance Program for patients that do not have  insurance, or whose insurance does not completely cover the cost of the ZIO monitor.  You must apply for the Patient Assistance Program to qualify for this discounted rate.  To apply, please call [employer] at [phone], select option 4, select option 2, ask to apply for  Patient Assistance Program. Meredeth Ide will ask your household income, and how many people  are in your household. They will quote your out-of-pocket cost based on that information.  [employer] will also be able to set up a 73-month, interest-free payment plan if needed.  Applying the monitor  Shave hair from upper left chest.  Hold abrader disc by orange tab. Rub abrader in 40 strokes over the upper left chest as  indicated in your monitor instructions.  Clean area with 4 enclosed alcohol pads. Let dry.  Apply patch as indicated in monitor instructions. Patch will be placed under collarbone on left  side of chest with arrow pointing upward.  Rub patch  adhesive wings for 2 minutes. Remove white label marked "1". Remove the white  label marked "2". Rub patch adhesive wings for 2 additional minutes.  While looking in a mirror, press and release button in center of patch. A small green light will  flash 3-4 times. This will be your only indicator that the monitor has been turned on.  Do not shower for the first 24 hours. You may shower after the first 24 hours.  Press the button if you feel a symptom. You will hear a small click. Record Date, Time and  Symptom in the Patient Logbook.  When you are ready to remove the patch, follow instructions on the last 2 pages of Patient  Logbook. Stick patch monitor onto the last page of Patient Logbook.  Place Patient Logbook in the blue and white box. Use locking tab on box and tape box closed  securely. The blue and white box has prepaid postage on it. Please place it in the mailbox as  soon as possible. Your physician should have your test results approximately 7 days after the  monitor has been mailed back to [facility].  Call [facility] Customer Care at [phone] if you have questions regarding  your ZIO XT patch monitor. Call them immediately if you see an orange light blinking on your  monitor.  If your monitor falls off in less than 4 days, contact our Monitor department at [phone].  If your monitor becomes loose or falls off after 4 days call [employer] at [phone] for  suggestions on securing your monitor    Follow-Up: At [facility], you and your health needs are our priority.  As part of our continuing mission to provide you with exceptional heart care, we have created designated Provider Care Teams.  These Care Teams include your primary Cardiologist (physician) and Advanced Practice Providers (APPs -  Physician Assistants and Nurse Practitioners) who all work together to provide you with the care you need, when you need it.  We recommend signing up for the  patient portal called "MyChart".  Sign up information is provided on this After Visit Summary.  MyChart is used to connect with patients for Virtual Visits (Telemedicine).  Patients are able to view lab/test results, encounter notes, upcoming appointments, etc.  Non-urgent messages can be sent to your provider as well.   To learn more about what you can do with MyChart, go to [email or web address].    Your next appointment:   4 month(s)  Provider:   You may see Julien Nordmann, MD or one of the following Advanced Practice Providers on your designated Care Team:   Nicolasa Ducking, NP Eula Listen, PA-C Cadence Fransico Michael, PA-C Charlsie Quest, NP Carlos Levering, NP    Other Instructions Referral to Electrophysiology- our team will call you for an appointment.

## 2023-03-28 NOTE — Telephone Encounter (Signed)
 Vit B12 Last filled:  02/26/23, #1 mL Last OV:  01/20/23, HFU Next OV:  04/21/23, 3 mo f/u

## 2023-03-28 NOTE — Telephone Encounter (Signed)
 FreeStyle Libre 2 sensor Last rx:  01/20/23, #6 ea Last OV:  01/20/23, HFU Next OV:  04/21/23, 3 mo f/u

## 2023-03-29 LAB — MAGNESIUM: Magnesium: 2.1 mg/dL (ref 1.6–2.3)

## 2023-03-29 LAB — TSH: TSH: 3.28 u[IU]/mL (ref 0.450–4.500)

## 2023-03-31 ENCOUNTER — Ambulatory Visit: Admitting: Physician Assistant

## 2023-03-31 MED ORDER — FREESTYLE LIBRE 2 SENSOR MISC: 1.0000 | 3 refills | Status: DC

## 2023-04-01 ENCOUNTER — Telehealth: Payer: Self-pay

## 2023-04-01 NOTE — Telephone Encounter (Signed)
 Received pt's med shipment of Novolin R 100 U (32 boxes total).   Spoke with pt notifying him of above med shipment. Pt verbalizes understanding.    [Placed Novolin R 100 U (32 boxes total) in 2nd refrigerator].

## 2023-04-07 ENCOUNTER — Other Ambulatory Visit: Payer: Self-pay | Admitting: Internal Medicine

## 2023-04-07 ENCOUNTER — Encounter: Admitting: Physician Assistant

## 2023-04-07 DIAGNOSIS — T2134XA Burn of third degree of lower back, initial encounter: Secondary | ICD-10-CM | POA: Diagnosis not present

## 2023-04-07 DIAGNOSIS — E11622 Type 2 diabetes mellitus with other skin ulcer: Secondary | ICD-10-CM | POA: Diagnosis not present

## 2023-04-08 ENCOUNTER — Other Ambulatory Visit: Payer: Self-pay | Admitting: Internal Medicine

## 2023-04-09 ENCOUNTER — Other Ambulatory Visit: Payer: Self-pay | Admitting: Internal Medicine

## 2023-04-09 ENCOUNTER — Encounter: Payer: Self-pay | Admitting: Internal Medicine

## 2023-04-11 ENCOUNTER — Other Ambulatory Visit: Payer: Self-pay | Admitting: Cardiovascular Disease

## 2023-04-14 DIAGNOSIS — G4733 Obstructive sleep apnea (adult) (pediatric): Secondary | ICD-10-CM | POA: Diagnosis not present

## 2023-04-16 ENCOUNTER — Encounter: Attending: Physician Assistant | Admitting: Physician Assistant

## 2023-04-16 DIAGNOSIS — I129 Hypertensive chronic kidney disease with stage 1 through stage 4 chronic kidney disease, or unspecified chronic kidney disease: Secondary | ICD-10-CM | POA: Insufficient documentation

## 2023-04-16 DIAGNOSIS — T2135XA Burn of third degree of buttock, initial encounter: Secondary | ICD-10-CM | POA: Diagnosis not present

## 2023-04-16 DIAGNOSIS — Z7901 Long term (current) use of anticoagulants: Secondary | ICD-10-CM | POA: Insufficient documentation

## 2023-04-16 DIAGNOSIS — N183 Chronic kidney disease, stage 3 unspecified: Secondary | ICD-10-CM | POA: Insufficient documentation

## 2023-04-16 DIAGNOSIS — E1151 Type 2 diabetes mellitus with diabetic peripheral angiopathy without gangrene: Secondary | ICD-10-CM | POA: Diagnosis not present

## 2023-04-16 DIAGNOSIS — S0081XA Abrasion of other part of head, initial encounter: Secondary | ICD-10-CM | POA: Insufficient documentation

## 2023-04-16 DIAGNOSIS — E1122 Type 2 diabetes mellitus with diabetic chronic kidney disease: Secondary | ICD-10-CM | POA: Diagnosis not present

## 2023-04-16 DIAGNOSIS — T310 Burns involving less than 10% of body surface: Secondary | ICD-10-CM | POA: Diagnosis not present

## 2023-04-16 DIAGNOSIS — E11622 Type 2 diabetes mellitus with other skin ulcer: Secondary | ICD-10-CM | POA: Insufficient documentation

## 2023-04-16 DIAGNOSIS — I2510 Atherosclerotic heart disease of native coronary artery without angina pectoris: Secondary | ICD-10-CM | POA: Insufficient documentation

## 2023-04-16 DIAGNOSIS — I480 Paroxysmal atrial fibrillation: Secondary | ICD-10-CM | POA: Insufficient documentation

## 2023-04-16 DIAGNOSIS — G4730 Sleep apnea, unspecified: Secondary | ICD-10-CM | POA: Diagnosis not present

## 2023-04-17 DIAGNOSIS — N183 Chronic kidney disease, stage 3 unspecified: Secondary | ICD-10-CM | POA: Diagnosis not present

## 2023-04-17 DIAGNOSIS — I2510 Atherosclerotic heart disease of native coronary artery without angina pectoris: Secondary | ICD-10-CM | POA: Diagnosis not present

## 2023-04-17 DIAGNOSIS — I480 Paroxysmal atrial fibrillation: Secondary | ICD-10-CM | POA: Diagnosis not present

## 2023-04-17 DIAGNOSIS — I10 Essential (primary) hypertension: Secondary | ICD-10-CM | POA: Diagnosis not present

## 2023-04-17 DIAGNOSIS — T2135XA Burn of third degree of buttock, initial encounter: Secondary | ICD-10-CM | POA: Diagnosis not present

## 2023-04-17 DIAGNOSIS — I7389 Other specified peripheral vascular diseases: Secondary | ICD-10-CM | POA: Diagnosis not present

## 2023-04-17 DIAGNOSIS — S0081XA Abrasion of other part of head, initial encounter: Secondary | ICD-10-CM | POA: Diagnosis not present

## 2023-04-17 DIAGNOSIS — G4730 Sleep apnea, unspecified: Secondary | ICD-10-CM | POA: Diagnosis not present

## 2023-04-17 DIAGNOSIS — Z7901 Long term (current) use of anticoagulants: Secondary | ICD-10-CM | POA: Diagnosis not present

## 2023-04-17 DIAGNOSIS — E11622 Type 2 diabetes mellitus with other skin ulcer: Secondary | ICD-10-CM | POA: Diagnosis not present

## 2023-04-21 ENCOUNTER — Encounter: Payer: Self-pay | Admitting: Family Medicine

## 2023-04-21 ENCOUNTER — Ambulatory Visit (INDEPENDENT_AMBULATORY_CARE_PROVIDER_SITE_OTHER): Payer: HMO | Admitting: Family Medicine

## 2023-04-21 VITALS — BP 164/66 | HR 59 | Temp 98.3°F | Ht 65.0 in | Wt 239.2 lb

## 2023-04-21 DIAGNOSIS — I480 Paroxysmal atrial fibrillation: Secondary | ICD-10-CM | POA: Diagnosis not present

## 2023-04-21 DIAGNOSIS — E1142 Type 2 diabetes mellitus with diabetic polyneuropathy: Secondary | ICD-10-CM

## 2023-04-21 DIAGNOSIS — I10 Essential (primary) hypertension: Secondary | ICD-10-CM

## 2023-04-21 DIAGNOSIS — S31000D Unspecified open wound of lower back and pelvis without penetration into retroperitoneum, subsequent encounter: Secondary | ICD-10-CM | POA: Diagnosis not present

## 2023-04-21 DIAGNOSIS — Z794 Long term (current) use of insulin: Secondary | ICD-10-CM | POA: Diagnosis not present

## 2023-04-21 NOTE — Assessment & Plan Note (Signed)
 Followed by endo, most recently started ozempic planned slow titration.  Also continues insulin (Novolin R and N).

## 2023-04-21 NOTE — Assessment & Plan Note (Signed)
 Latest hospitalization complicated by afib with slowed ventricular response.  He is now on eliquis 5mg  bid.  Planned EP f/u later this month.

## 2023-04-21 NOTE — Patient Instructions (Addendum)
 BP was elevated today. Continue current medicines  Change losartan and isosorbide to AM dosing Start monitoring blood pressures at home, BP log sheet provided today. Drop off in 1-2 weeks to review numbers.  Return in 3 months for physical.

## 2023-04-21 NOTE — Progress Notes (Signed)
 Ph: [phone] Fax: [phone]   Patient ID: Donald [name], male    DOB: 1945-03-29, 78 y.o.   MRN: [id number]  This visit was conducted in person.  BP (!) 164/66   Pulse (!) 59   Temp 98.3 F (36.8 C) (Oral)   Ht 5\' 5"  (1.651 m)   Wt 239 lb 4 oz (108.5 kg)   SpO2 96%   BMI 39.81 kg/m   Elevated on repeat testing   CC: 3 mo f/u visit  Subjective:   HPI: Donald [name] is a 78 y.o. male presenting on 04/21/2023 for Medical Management of Chronic Issues (Here for 3 mo f/u.)   Sacral ulcer - doing remarkably well - told by wound clinic maybe 1-2 more weeks prior to fully healed  Developed afib during hospitalization 12/2022 s/p cardioversion. Wore heart monitor, planned EP f/u with Dr Jimmey Ralph.   Regularly sees endo for diabetes on novolin N and novolin R as well as ozempic 0.5mg  weekly (higher dose started last week).  Lab Results  Component Value Date   HGBA1C 6.6 (A) 02/19/2023    Overall feeling well.   HTN - Compliant with current antihypertensive regimen of doxazosin 1mg  bid, furosemide 80mg  daily, imdur 30mg  daily, losartan 25mg  daily. Does not check blood pressures at home, last checked last week 148/64. No low blood pressure readings or symptoms of dizziness/syncope. Denies HA, vision changes, CP/tightness, SOB, leg swelling.       Relevant past medical, surgical, family and social history reviewed and updated as indicated. Interim medical history since our last visit reviewed. Allergies and medications reviewed and updated. Outpatient Medications Prior to Visit  Medication Sig Dispense Refill   albuterol (VENTOLIN HFA) 108 (90 Base) MCG/ACT inhaler Inhale 2 puffs into the lungs every 6 (six) hours as needed for wheezing or shortness of breath. 8 g 0   allopurinol (ZYLOPRIM) 300 MG tablet TAKE ONE TABLET BY MOUTH EVERY DAY 90 tablet 4   apixaban (ELIQUIS) 5 MG TABS tablet TAKE ONE (1) TABLET BY MOUTH TWO TIMES PER DAY 60 tablet 5   Cholecalciferol  (VITAMIN D3) 25 MCG (1000 UT) CAPS Take 1 capsule (1,000 Units total) by mouth daily. 30 capsule    colchicine 0.6 MG tablet TAKE 1 TABLET BY MOUTH DAILY AS NEEDED FOR GOUT GLARE. ON FIRST DAY OF GOUT FLARE,MAY TAKE 2 TABLETS AT ONCE AS DIRECTED 30 tablet 0   Continuous Glucose Sensor (FREESTYLE LIBRE 2 SENSOR) MISC 1 Device by Does not apply route every 14 (fourteen) days. 6 each 3   cyanocobalamin (VITAMIN B12) 1000 MCG/ML injection INJECT INTRAMUSCULARLY EVERY 30 DAYS 1 mL 6   doxazosin (CARDURA) 1 MG tablet TAKE ONE TABLET BY MOUTH TWICE DAILY 180 tablet 0   furosemide (LASIX) 80 MG tablet TAKE 1 TABLET BY MOUTH EVERY DAY. MAY TAKE AN EXTRA PILL FOR SWELLING. 90 tablet 1   gabapentin (NEURONTIN) 600 MG tablet TAKE TWO TABLETS AT BEDTIME 180 tablet 3   glucose blood (GE100 BLOOD GLUCOSE TEST) test strip TEST FOUR TIMES DAILY 200 each 10   HYDROcodone-acetaminophen (NORCO) 10-325 MG tablet Take 1 tablet by mouth every 6 (six) hours as needed for moderate pain ((typically once to twice daily if needed)).  30 tablet 0   insulin regular (NOVOLIN R) 100 units/mL injection Inject 1.2-1.6 mLs (120-160 Units total) into the skin 2 (two) times daily before a meal. INJECT 120 UNITS BEFORE BRUNCH AND SUPPER. ADD EXTRA UNITS ON INSULIN TO YOUR MEAL  IF YOUR BLOOD SUGARS ARE HIGHER THAN 155 140 mL 6   isosorbide mononitrate (IMDUR) 30 MG 24 hr tablet TAKE ONE TABLET BY MOUTH DAILY 90 tablet 2   losartan (COZAAR) 25 MG tablet Take 1 tablet (25 mg total) by mouth at bedtime. 90 tablet 3   lovastatin (MEVACOR) 40 MG tablet TAKE TWO TABLETS BY MOUTH AT BEDTIME 180 tablet 1   meclizine (ANTIVERT) 25 MG tablet Take 25 mg by mouth 3 (three) times daily as needed for dizziness.     mupirocin ointment (BACTROBAN) 2 % Apply 1 Application topically daily as needed (wound care).     Naphazoline-Pheniramine (OPCON-A OP) Place 1 drop into both eyes 3 (three) times daily as needed (itchy eyes.).      nitroGLYCERIN  (NITROSTAT) 0.4 MG SL tablet PLACE 1 TABLET UNDER TONGUE EVERY 5 MIN AS NEEDED FOR CHEST PAIN IF NO RELIEF IN15 MIN CALL 911 (MAX 3 TABS) 25 tablet 11   NOVOLIN N 100 UNIT/ML injection Novolin-N 120 units at  bedtime ( If fasting sugar are below 100, decrease by 10 points and stay on the new dose, until its below 100 mg  etc) 100 mL 0   Semaglutide,0.25 or 0.5MG /DOS, (OZEMPIC, 0.25 OR 0.5 MG/DOSE,) 2 MG/1.5ML SOPN Inject 0.25 mg into the skin once a week.     Sennosides-Docusate Sodium (STOOL SOFTENER & LAXATIVE PO) Take 2 tablets by mouth at bedtime.     Syringe/Needle, Disp, (SYRINGE 3CC/22GX1-1/2") 22G X 1-1/2" 3 ML MISC Use to administer monthly b12 shots 50 each 0   triamcinolone cream (KENALOG) 0.1 % Apply 1 Application topically 2 (two) times daily as needed. 45 g 1   valACYclovir (VALTREX) 1000 MG tablet Take 1 tablet (1,000 mg total) by mouth 3 (three) times daily. 21 tablet 1   No facility-administered medications prior to visit.     Per HPI unless specifically indicated in ROS section below Review of Systems  Objective:  BP (!) 164/66   Pulse (!) 59   Temp 98.3 F (36.8 C) (Oral)   Ht 5\' 5"  (1.651 m)   Wt 239 lb 4 oz (108.5 kg)   SpO2 96%   BMI 39.81 kg/m   Wt Readings from Last 3 Encounters:  04/21/23 239 lb 4 oz (108.5 kg)  03/28/23 238 lb 12.8 oz (108.3 kg)  03/11/23 263 lb 14.4 oz (119.7 kg)      Physical Exam Vitals and nursing note reviewed.  Constitutional:      Appearance: Normal appearance. He is not ill-appearing.  HENT:     Head: Normocephalic and atraumatic.     Mouth/Throat:     Mouth: Mucous membranes are moist.     Pharynx: Oropharynx is clear. No oropharyngeal exudate or posterior oropharyngeal erythema.  Eyes:     Extraocular Movements: Extraocular movements intact.     Pupils: Pupils are equal, round, and reactive to light.  Neck:     Thyroid: No thyroid mass or thyromegaly.  Cardiovascular:     Rate and Rhythm: Normal rate and regular rhythm.      Pulses: Normal pulses.     Heart sounds: Normal heart sounds. No murmur heard. Pulmonary:     Effort: Pulmonary effort is normal. No respiratory distress.     Breath sounds: Normal breath sounds. No wheezing, rhonchi or rales.  Musculoskeletal:     Cervical back: Normal range of motion and neck supple.     Right lower leg: No edema.     Left  lower leg: No edema.  Skin:    General: Skin is warm and dry.     Findings: No rash.     Comments: Almost fully resolved sacral sore at site of previous large ulcer  Neurological:     Mental Status: He is alert.  Psychiatric:        Mood and Affect: Mood normal.        Behavior: Behavior normal.       Results for orders placed or performed in visit on 03/28/23  Magnesium   Collection Time: 03/28/23 11:31 AM  Result Value Ref Range   Magnesium 2.1 1.6 - 2.3 mg/dL  TSH   Collection Time: 03/28/23 11:31 AM  Result Value Ref Range   TSH 3.280 0.450 - 4.500 uIU/mL   *Note: Due to a large number of results and/or encounters for the requested time period, some results have not been displayed. A complete set of results can be found in Results Review.    Assessment & Plan:   Problem List Items Addressed This Visit     Essential hypertension   Chronic, deteriorated in office today.  He is taking imdur and losartan in evenings - recommend transition these to AM dosing.  BP log sheet provided to start monitoring at home, drop off readings in 1-2 wks to review and further titrate antihypertensives accordingly .  Reassess at CPE in 3 months.       Type 2 diabetes mellitus with diabetic polyneuropathy (HCC)   Followed by endo, most recently started ozempic planned slow titration.  Also continues insulin (Novolin R and N).       Paroxysmal atrial fibrillation (HCC)   Latest hospitalization complicated by afib with slowed ventricular response.  He is now on eliquis 5mg  bid.  Planned EP f/u later this month.       Sacral wound,  subsequent encounter - Primary   Appreciate wound clinic care.  Wound is almost fully healed.  Continue wound care.         No orders of the defined types were placed in this encounter.   No orders of the defined types were placed in this encounter.   Patient Instructions  BP was elevated today. Continue current medicines  Change losartan and isosorbide to AM dosing Start monitoring blood pressures at home, BP log sheet provided today. Drop off in 1-2 weeks to review numbers.  Return in 3 months for physical.   Follow up plan: Return in about 3 months (around 07/21/2023) for annual exam, prior fasting for blood work.  [name], MD

## 2023-04-21 NOTE — Assessment & Plan Note (Signed)
 Chronic, deteriorated in office today.  He is taking imdur and losartan in evenings - recommend transition these to AM dosing.  BP log sheet provided to start monitoring at home, drop off readings in 1-2 wks to review and further titrate antihypertensives accordingly .  Reassess at CPE in 3 months.

## 2023-04-21 NOTE — Assessment & Plan Note (Signed)
 Appreciate wound clinic care.  Wound is almost fully healed.  Continue wound care.

## 2023-04-22 ENCOUNTER — Other Ambulatory Visit: Payer: Self-pay | Admitting: Cardiovascular Disease

## 2023-04-23 ENCOUNTER — Telehealth: Payer: Self-pay

## 2023-04-23 ENCOUNTER — Other Ambulatory Visit (HOSPITAL_COMMUNITY): Payer: Self-pay

## 2023-04-23 NOTE — Telephone Encounter (Signed)
 Pharmacy Patient Advocate Encounter   Received notification from CoverMyMeds that prior authorization for Ozempic is required/requested.   Insurance verification completed.   The patient is insured through [facility] .   Per test claim: PA required; PA submitted to above mentioned insurance via CoverMyMeds Key/confirmation #/EOC [facility] Status is pending

## 2023-04-24 ENCOUNTER — Encounter: Admitting: Physician Assistant

## 2023-04-24 DIAGNOSIS — T2135XA Burn of third degree of buttock, initial encounter: Secondary | ICD-10-CM | POA: Diagnosis not present

## 2023-04-24 DIAGNOSIS — T3111 Burns involving 10-19% of body surface with 10-19% third degree burns: Secondary | ICD-10-CM | POA: Diagnosis not present

## 2023-04-24 DIAGNOSIS — T2134XA Burn of third degree of lower back, initial encounter: Secondary | ICD-10-CM | POA: Diagnosis not present

## 2023-04-25 ENCOUNTER — Telehealth: Payer: Self-pay

## 2023-04-25 NOTE — Telephone Encounter (Signed)
 Received faxed DWO order for Wound Care from [employer].   Dr Reece Agar request answers to following questions: Does pt still need wound care supplies from this company? Is wound clinic ordering supplies?   Spoke with pt notifying him of order received and asking Dr Timoteo Expose questions. Pt states he was at wound clinic yesterday. Says he was told he will be there 2 more times. Pt states he gets supplies from them and has enough for the last 2 visits.

## 2023-04-28 ENCOUNTER — Telehealth: Payer: Self-pay | Admitting: Medical

## 2023-04-28 DIAGNOSIS — I480 Paroxysmal atrial fibrillation: Secondary | ICD-10-CM | POA: Diagnosis not present

## 2023-04-28 DIAGNOSIS — M5416 Radiculopathy, lumbar region: Secondary | ICD-10-CM | POA: Diagnosis not present

## 2023-04-28 NOTE — Telephone Encounter (Signed)
 Donald [name] is calling from [employer] to report critical results from zio monitor. Call transferred

## 2023-04-28 NOTE — Telephone Encounter (Signed)
 Report given to Dr. Gollan.

## 2023-04-28 NOTE — Telephone Encounter (Signed)
 Noted. Thanks.

## 2023-04-28 NOTE — Telephone Encounter (Signed)
 Called patient, advised that he did not have any symptoms at this time or during the duration of the monitor. He did mention having one episode on day two of the monitor (becoming very tired, fatigued) but nothing other than that was noted.   Patient aware to keep appointment with EP on 04/22. Patient verbalized understanding, is scheduled for ECHO tomorrow here in office.

## 2023-04-28 NOTE — Telephone Encounter (Signed)
 Patient was returning call. Please advise ?

## 2023-04-28 NOTE — Telephone Encounter (Signed)
 Calling in critical results for this patient. Patient had 88 pauses lasting 3-5.1 seconds.   Strip #4 had back to back pauses lasting total 7.2 seconds. They also noted atrial flutter and ventral tachycardia.   Printing report and will give to patients doctor who is also DOD today.

## 2023-04-28 NOTE — Telephone Encounter (Signed)
 Left voicemail message to call back to inquire if he had any symptoms.    Gollan, Timothy J, MD  Gennaro Khat, Cadence H, PA-C Cc: Dudley Ghee, RN Caller: Unspecified (Today, 12:22 PM) Oralee Billow can we call him to see if he was having any symptoms with his pauses Cannot exclude sleep apnea as a contributor Pauses previously seen 2024  Zio monitor reviewed, 100% atrial flutter Scheduled to see EP April 22  Thx TGollan       Previous Messages  Results Doctor, hospital First) View All Conversations on this Encounter You  Con, Arganbright [phone] now (2:33 PM)    Devorah Fonder, MD  You; Gennaro Khat, Cadence H, PA-[id number] minutes ago (1:46 PM)    Pam can we call him to see if he was having any symptoms with his pauses Cannot exclude sleep apnea as a contributor Pauses previously seen 2024  Zio monitor reviewed, 100% atrial flutter Scheduled to see EP April 22  Thx TGollan

## 2023-04-28 NOTE — Telephone Encounter (Signed)
 Pharmacy Patient Advocate Encounter  Received notification from [facility] that Prior Authorization for Ozempic has been APPROVED through 04/22/24

## 2023-04-29 ENCOUNTER — Ambulatory Visit: Attending: Medical

## 2023-04-29 DIAGNOSIS — I5042 Chronic combined systolic (congestive) and diastolic (congestive) heart failure: Secondary | ICD-10-CM

## 2023-04-29 DIAGNOSIS — I4819 Other persistent atrial fibrillation: Secondary | ICD-10-CM | POA: Diagnosis not present

## 2023-04-29 LAB — ECHOCARDIOGRAM COMPLETE
AR max vel: 1.94 cm2
AV Area VTI: 2.02 cm2
AV Area mean vel: 1.9 cm2
AV Mean grad: 11 mmHg
AV Peak grad: 20.9 mmHg
Ao pk vel: 2.29 m/s
Area-P 1/2: 4.89 cm2
S' Lateral: 4.17 cm

## 2023-05-02 ENCOUNTER — Encounter

## 2023-05-02 DIAGNOSIS — T2135XA Burn of third degree of buttock, initial encounter: Secondary | ICD-10-CM | POA: Diagnosis not present

## 2023-05-05 NOTE — Progress Notes (Signed)
 Electrophysiology Office Note:   Date:  05/06/2023  ID:  Donald [name], DOB 1945-02-24, MRN [id number]  Primary Cardiologist: Belva Boyden, MD Electrophysiologist: Ardeen Kohler, MD      History of Present Illness:   Donald [name] is a 78 y.o. male with h/o HFpEF, persistent Afib, nonobstructive CAD, poorly controlled insulin -dependent diabetes, hyperlipidemia, PAD, DVT and extensive bilateral pulmonary edema 2009, CKD stage III, OSA on CPAP, asthmatic bronchitis, morbid obesity, gout, GERD, anemia, history of hematuria, status post parathyroidectomy on the right side in 2019, hypercalcemia, chronic back pain who is being seen today for evaluation of atrial fibrillation and flutter.   Discussed the use of AI scribe software for clinical note transcription with the patient, who gave verbal consent to proceed.  History of Present Illness Patient was admitted to the hospital in December 2020 for with sacral wound secondary to MRSA.  During admission was found to have atrial fibrillation with slow ventricular response.  He presented for posthospital discharge follow-up in February and was in atrial flutter with rates in the 70s.  He underwent cardioversion on 03/11/2023.  He came back for cardiology follow-up visit on 03/28/2023 and was back in atrial fibrillation with slow ventricular response. Despite undergoing cardioversion, he did not perceive any symptomatic improvement and was unaware during a follow-up visit that he was out of rhythm again. He does experience low energy and fatigue, which he attributes to deconditioning and his chronic back pain. He previously received spinal injections every three to four months for pain control, but these have been delayed due to his healing sacral wound. His wound is nearly completely healed, and he is hoping to resume injections in the next 2-3 weeks, which he believes will help him be more active around the house.He uses a CPAP machine for sleep  apnea, diagnosed several years ago, and reports compliance with the machine, even during naps, due to concerns about heart pauses noted during his initial sleep study years ago. He typically wakes up around 9:30 to 10:00 AM and goes to bed around 1:00 AM. He has experienced slower heart rates during sleep, as noted on a heart monitor, but these occur primarily at night.  Review of systems complete and found to be negative unless listed in HPI.   EP Information / Studies Reviewed:       EKG 03/28/23: AF   EKG 03/11/23: Typical AFL   Zio 04/28/23:  Patch Wear Time:  13 days and 23 hours (2025-03-19T13:55:40-0400 to 2025-04-02T13:29:25-0400)   1 run of Ventricular Tachycardia occurred lasting 4 beats with a max rate of 110 bpm (avg 101 bpm). Atrial Flutter occurred continuously (100% burden), ranging from 28-126 bpm (avg of 52 bpm). 88 Pauses occurred, the longest lasting 5.1 secs (12 bpm).  Isolated VEs were occasional (1.2%, 10813), VE Couplets were rare (<1.0%, 688), and no VE Triplets were present. Ventricular Bigeminy and Trigeminy were present. Difficulty discerning atrial activity making definitive diagnosis difficult to ascertain. MD  notification criteria for Slow Atrial Flutter and Pauses met - report posted prior to notification (DR).  Echo 04/29/23:   1. Left ventricular ejection fraction, by estimation, is 60 to 65%. The  left ventricle has normal function. The left ventricle has no regional  wall motion abnormalities. There is mild left ventricular hypertrophy.  Left ventricular diastolic parameters  are indeterminate.   2. Right ventricular systolic function is normal. The right ventricular  size is normal. Tricuspid regurgitation signal is inadequate for assessing  PA pressure.  3. Left atrial size was moderately dilated.   4. The mitral valve is normal in structure. Mild mitral valve  regurgitation. No evidence of mitral stenosis.   5. The aortic valve is normal in  structure. There is mild calcification  of the aortic valve. Aortic valve regurgitation is not visualized. Aortic  valve sclerosis/calcification is present, without any evidence of aortic  stenosis.   6. The inferior vena cava is normal in size with greater than 50%  respiratory variability, suggesting right atrial pressure of 3 mmHg.   LHC 11/01/20: Mild, non-obstructive coronary artery disease, as detailed below.  No significant stenosis identified to explain syncope or cardiomyopathy. Normal left ventricular filling pressure. Mild-moderate aortic valve stenosis based on peak-to-peak gradient (~20 mmHg).    Risk Assessment/Calculations:    CHA2DS2-VASc Score = 5   This indicates a 7.2% annual risk of stroke. The patient's score is based upon: CHF History: 1 HTN History: 1 Diabetes History: 1 Stroke History: 0 Vascular Disease History: 0 Age Score: 2 Gender Score: 0             Physical Exam:   VS:  BP 102/60   Pulse (!) 58   Ht 5\' 5"  (1.651 m)   Wt 237 lb 12.8 oz (107.9 kg)   SpO2 95%   BMI 39.57 kg/m    Wt Readings from Last 3 Encounters:  05/06/23 237 lb 12.8 oz (107.9 kg)  04/21/23 239 lb 4 oz (108.5 kg)  03/28/23 238 lb 12.8 oz (108.3 kg)     GEN: Well nourished, well developed in no acute distress NECK: No JVD CARDIAC: Bradycardic, irregular RESPIRATORY:  Clear to auscultation without rales, wheezing or rhonchi  ABDOMEN: Soft, non-distended EXTREMITIES:  Trace edema; No deformity   ASSESSMENT AND PLAN:    #. Persistent atrial fibrillation and typical atrial flutter: Patient has predominantly been in atrial fibrillation or atrial flutter since December 2024.  Last EKG of sinus rhythm that we have available is from June 2024.  Patient is not sure when exactly he went out of rhythm.  He was acutely ill in December which may have precipitated his atrial fibrillation or he spontaneously went out of rhythm sometime between June and December. He underwent  cardioversion and did not appreciate any improvement in symptoms.  He appears to be asymptomatic, although baseline activity is limited.  He is hoping to receive some back injections for pain, which will be able to increase his level of activity.  He usually needs his anticoagulation held for this.  He is hoping to have these injections in the next 2 to 3 weeks.  For this reason, we will not pursue any rhythm control strategy at this time given anticipated need for interruption of anticoagulation.  Is unclear if he would benefit from rhythm control strategy at all.  He is relatively rate controlled on his own.  We will have him return to clinic in 3 months to reassess his functional status and, if improved, consider whether retrialing cardioversion would be beneficial.  #. Bradycardia: He had bradycardia on his Zio monitor while in atrial fibrillation/flutter.  All of his significant bradycardia/pauses seem to have occurred during sleep.  He is asymptomatic from his bradycardia at rest.  We will avoid any rate controlling medications.  #. Secondary hypercoagulable state due to atrial fibrillation/flutter: CHADSVASC score of 5.  - Continue Eliquis  5mg  BID.   #. OSA on CPAP: Patient states that he uses CPAP with naps and sleep at night daily. -  Encouraged to continued compliance with CPAP.  #Hypertension -At goal today.  Recommend checking blood pressures 1-2 times per week at home and recording the values.  Recommend bringing these recordings to the primary care physician.  #. Sacral wound: Occurred in the setting of uncontrolled diabetes. - He has been following with wound care.  Wound is nearly healed.  Follow up with EP APP in 3 months.  If no change in symptoms to justify pursuing a rhythm control strategy at his next visit, then patient can follow-up moving forward with general cardiology.  Signed, Ardeen Kohler, MD

## 2023-05-06 ENCOUNTER — Ambulatory Visit: Attending: Cardiology | Admitting: Cardiology

## 2023-05-06 ENCOUNTER — Encounter: Payer: Self-pay | Admitting: Cardiology

## 2023-05-06 ENCOUNTER — Other Ambulatory Visit: Payer: Self-pay | Admitting: Cardiovascular Disease

## 2023-05-06 VITALS — BP 102/60 | HR 58 | Ht 65.0 in | Wt 237.8 lb

## 2023-05-06 DIAGNOSIS — I10 Essential (primary) hypertension: Secondary | ICD-10-CM | POA: Diagnosis not present

## 2023-05-06 DIAGNOSIS — G4733 Obstructive sleep apnea (adult) (pediatric): Secondary | ICD-10-CM

## 2023-05-06 DIAGNOSIS — R001 Bradycardia, unspecified: Secondary | ICD-10-CM

## 2023-05-06 DIAGNOSIS — I483 Typical atrial flutter: Secondary | ICD-10-CM | POA: Diagnosis not present

## 2023-05-06 DIAGNOSIS — D6869 Other thrombophilia: Secondary | ICD-10-CM | POA: Diagnosis not present

## 2023-05-06 DIAGNOSIS — I4819 Other persistent atrial fibrillation: Secondary | ICD-10-CM | POA: Diagnosis not present

## 2023-05-06 NOTE — Patient Instructions (Signed)
 Medication Instructions:  Your physician recommends that you continue on your current medications as directed. Please refer to the Current Medication list given to you today.  *If you need a refill on your cardiac medications before your next appointment, please call your pharmacy*  Follow-Up: At [facility], you and your health needs are our priority.  As part of our continuing mission to provide you with exceptional heart care, our providers are all part of one team.  This team includes your primary Cardiologist (physician) and Advanced Practice Providers or APPs (Physician Assistants and Nurse Practitioners) who all work together to provide you with the care you need, when you need it.  Your next appointment:   3 months  Provider:   Sherie Don, NP

## 2023-05-08 ENCOUNTER — Encounter: Admitting: Physician Assistant

## 2023-05-08 DIAGNOSIS — T2135XA Burn of third degree of buttock, initial encounter: Secondary | ICD-10-CM | POA: Diagnosis not present

## 2023-05-08 DIAGNOSIS — T2134XD Burn of third degree of lower back, subsequent encounter: Secondary | ICD-10-CM | POA: Diagnosis not present

## 2023-05-08 DIAGNOSIS — T3111 Burns involving 10-19% of body surface with 10-19% third degree burns: Secondary | ICD-10-CM | POA: Diagnosis not present

## 2023-05-12 DIAGNOSIS — I480 Paroxysmal atrial fibrillation: Secondary | ICD-10-CM

## 2023-05-13 DIAGNOSIS — M5416 Radiculopathy, lumbar region: Secondary | ICD-10-CM | POA: Diagnosis not present

## 2023-05-13 DIAGNOSIS — G894 Chronic pain syndrome: Secondary | ICD-10-CM | POA: Diagnosis not present

## 2023-05-16 ENCOUNTER — Ambulatory Visit: Admitting: Physician Assistant

## 2023-05-23 DIAGNOSIS — N1831 Chronic kidney disease, stage 3a: Secondary | ICD-10-CM | POA: Diagnosis not present

## 2023-05-26 ENCOUNTER — Other Ambulatory Visit: Payer: Self-pay | Admitting: Cardiovascular Disease

## 2023-06-17 ENCOUNTER — Encounter: Payer: Self-pay | Admitting: Family Medicine

## 2023-06-17 ENCOUNTER — Ambulatory Visit (INDEPENDENT_AMBULATORY_CARE_PROVIDER_SITE_OTHER): Admitting: Family Medicine

## 2023-06-17 ENCOUNTER — Ambulatory Visit: Admitting: Family Medicine

## 2023-06-17 VITALS — BP 132/72 | HR 60 | Temp 98.8°F | Ht 65.0 in | Wt 231.2 lb

## 2023-06-17 DIAGNOSIS — M542 Cervicalgia: Secondary | ICD-10-CM

## 2023-06-17 MED ORDER — CYCLOBENZAPRINE HCL 5 MG PO TABS: 5.0000 mg | ORAL_TABLET | Freq: Every evening | ORAL | 0 refills | Status: DC | PRN

## 2023-06-17 MED ORDER — INSULIN REGULAR HUMAN 100 UNIT/ML IJ SOLN: INTRAMUSCULAR | Status: AC

## 2023-06-17 MED ORDER — NOVOLIN N 100 UNIT/ML ~~LOC~~ SUSP: SUBCUTANEOUS | Status: DC

## 2023-06-17 NOTE — Progress Notes (Unsigned)
 Back/neck pain, h/o ruptured disk and surgery.  Chronic pain at baseline.  He has taken hydrocodone  at baseline.    He prev burned his back with a heating pad, had to have surgery related to that.  Gets injections quarterly per Dr. Rexanne Catalina.  Heating pad cautions discussed with patient.  He is usually taking hydrocodone  3-4 times per day.    Walking with walker at baseline.    DM2 per endo.    Lower back pain with standing, better sitting and laying down.    More recently with upper back and L neck pain. Using soltice rub in the meantime.  No falls.  No trauma.  No FCNAVD.  No arm pain.  Neck ROM still intact.    He can take flexeril  and gabapentin  w/o ADE on med.  He has used both medications previously.  Meds, vitals, and allergies reviewed.   ROS: Per HPI unless specifically indicated in ROS section   Nad Ncat Neck supple but tender to palpation in the left paraspinal muscles in the neck.  Not having midline pain.  No rash locally.  No lymphadenopathy. Rrr with SEM noted Ctab Abdomen soft.  Nontender. Strength and sensation grossly normal for the upper extremities bilaterally.

## 2023-06-17 NOTE — Patient Instructions (Addendum)
 Try taking an extra 300-600mg  of gabapentin  in the mornings.    Try flexeril  if needed.  Sedation caution.    Try to keep stretching.  Update us  as needed.    Take care.  Glad to see you.

## 2023-06-18 DIAGNOSIS — D2271 Melanocytic nevi of right lower limb, including hip: Secondary | ICD-10-CM | POA: Diagnosis not present

## 2023-06-18 DIAGNOSIS — M542 Cervicalgia: Secondary | ICD-10-CM | POA: Insufficient documentation

## 2023-06-18 DIAGNOSIS — Z872 Personal history of diseases of the skin and subcutaneous tissue: Secondary | ICD-10-CM | POA: Diagnosis not present

## 2023-06-18 DIAGNOSIS — D2272 Melanocytic nevi of left lower limb, including hip: Secondary | ICD-10-CM | POA: Diagnosis not present

## 2023-06-18 DIAGNOSIS — L821 Other seborrheic keratosis: Secondary | ICD-10-CM | POA: Diagnosis not present

## 2023-06-18 DIAGNOSIS — Z09 Encounter for follow-up examination after completed treatment for conditions other than malignant neoplasm: Secondary | ICD-10-CM | POA: Diagnosis not present

## 2023-06-18 DIAGNOSIS — D2262 Melanocytic nevi of left upper limb, including shoulder: Secondary | ICD-10-CM | POA: Diagnosis not present

## 2023-06-18 DIAGNOSIS — D2261 Melanocytic nevi of right upper limb, including shoulder: Secondary | ICD-10-CM | POA: Diagnosis not present

## 2023-06-18 DIAGNOSIS — D225 Melanocytic nevi of trunk: Secondary | ICD-10-CM | POA: Diagnosis not present

## 2023-06-18 DIAGNOSIS — Z85828 Personal history of other malignant neoplasm of skin: Secondary | ICD-10-CM | POA: Diagnosis not present

## 2023-06-18 DIAGNOSIS — Z08 Encounter for follow-up examination after completed treatment for malignant neoplasm: Secondary | ICD-10-CM | POA: Diagnosis not present

## 2023-06-18 DIAGNOSIS — D485 Neoplasm of uncertain behavior of skin: Secondary | ICD-10-CM | POA: Diagnosis not present

## 2023-06-18 DIAGNOSIS — L820 Inflamed seborrheic keratosis: Secondary | ICD-10-CM | POA: Diagnosis not present

## 2023-06-18 NOTE — Assessment & Plan Note (Signed)
 Likely combination of muscle spasm with flare of degenerative changes.  Discussed options.  Would avoid prednisone  at this point.  Already taking hydrocodone . Taking gabapentin  in the evening at baseline.   He can try taking an extra 300-600mg  of gabapentin  in the mornings.   He can try flexeril  if needed.  Sedation caution.   I asked him to try to keep stretching.  Update us  as needed.  He agreed with plan.

## 2023-06-23 ENCOUNTER — Other Ambulatory Visit: Payer: Self-pay | Admitting: Urology

## 2023-06-24 ENCOUNTER — Ambulatory Visit: Payer: PPO | Admitting: Internal Medicine

## 2023-06-24 ENCOUNTER — Encounter: Payer: Self-pay | Admitting: Internal Medicine

## 2023-06-24 VITALS — BP 124/72 | HR 61 | Ht 65.0 in | Wt 235.0 lb

## 2023-06-24 DIAGNOSIS — Z794 Long term (current) use of insulin: Secondary | ICD-10-CM

## 2023-06-24 DIAGNOSIS — E1142 Type 2 diabetes mellitus with diabetic polyneuropathy: Secondary | ICD-10-CM

## 2023-06-24 LAB — POCT GLYCOSYLATED HEMOGLOBIN (HGB A1C): Hemoglobin A1C: 7.2 % — AB (ref 4.0–5.6)

## 2023-06-24 NOTE — Progress Notes (Unsigned)
 Name: Donald [name]  Age/ Sex: 78 y.o., male   MRN/ DOB: [id number], 1945-02-03     PCP: Claire Crick, MD   Reason for Endocrinology Evaluation: Type 2 Diabetes Mellitus  Initial Endocrine Consultative Visit: 12/25/2011    PATIENT IDENTIFIER: [name] is a 78 y.o. male with a past medical history of DM, CAD, Asthma, OSA and Dyslipidemia . The patient has followed with Endocrinology clinic since 12/25/2011 for consultative assistance with management of his diabetes.  DIABETIC HISTORY:  [name] was diagnosed with DM 1989.  Pioglitazone-swelling, Metformin - low GFR, he has been on insulin  since 1991. His hemoglobin A1c has ranged from 6.9% in 2019, peaking at 10.1% in 2012.   He was followed by Dr. Washington Hacker from 2013 until 04/2021  Ozempic   started by PCP 02/2022   On his initial visit with me 03/2022 his A1c was 7.4% he was just started on Ozempic  by his PCP, with continued NPH and regular insulin  and was provided with a correction scale   SUBJECTIVE:   During the last visit (02/19/2023): A1c 6.6%    Today (06/24/2023): [name] is here for follow-up on diabetes management.  He checks his blood sugars 2-3 times daily. The patient has  had hypoglycemic episodes since the last clinic visit, which typically occur at night . The patient is  symptomatic with these episodes    He continues to follow-up with cardiology for CAD and A.Fib . S/P cardioversion for A.Fib  02/2023  Has chronic back pain , receives intra-articular injections  Denies nausea or vomiting  Has occasional constipation but no diarrhea that he attributes to opiates    HOME DIABETES REGIMEN:  Ozempic  1 mg weekly  Novolin -N 120 units bedtime  Novolin -R  120 units before brunch and Supper  CF: Novolin -R (BG-130/15)     Statin: Yes ACE-I/ARB: Yes    CONTINUOUS GLUCOSE MONITORING RECORD INTERPRETATION    Dates of Recording: 5/28 - 06/24/2023  Sensor description: [employer]  Results statistics:   CGM use % of time 77  Average and SD 165/27.8  Time in range 64 %  % Time Above 180 30  % Time above 250 4  % Time Below target 2   Glycemic patterns summary: BGs trend down overnight and fluctuate throughout the day Hyperglycemic episodes postprandial  Hypoglycemic episodes occurred overnight  Overnight periods: Trends down    DIABETIC COMPLICATIONS: Microvascular complications:  CKD, neuropathy Denies:  Last Eye Exam: Completed   Macrovascular complications:  CAD Denies:  CVA, PVD   HISTORY:  Past Medical History:  Past Medical History:  Diagnosis Date   (HFpEF) heart failure with preserved ejection fraction (HCC)    a. 09/2018 Echo: EF 60-65%. PASP . Mild-mod LAE. Mild MR/TR.   ANEMIA-IRON DEFICIENCY 07/26/2006   Anxiety    ASTHMA 07/26/2006   Asthma    Back pain, chronic    Bell's palsy    CKD (chronic kidney disease), stage III (HCC)    COLONIC POLYPS, HX OF 07/26/2006   COVID-19 virus infection 01/2020   DEPRESSION 03/14/2009   DIABETES MELLITUS, TYPE II 07/26/2006   DISC DISEASE, LUMBAR 10/05/2007   DVT 12/03/2007   DYSLIPIDEMIA 04/13/2009   Dyspnea    when gets up and walks around and back is hurting really bad -only Shortness of breath  then   GERD 07/26/2006   Gout    Heart murmur    History of kidney stones    HYPERTENSION 07/26/2006  INSOMNIA 08/21/2007   Neuropathy    Nonobstructive CAD (coronary artery disease)    a. 2012 Cath: no high grade stenosis; b. 2018 MV: No ischemia. Attenuation artifact.    OBSTRUCTIVE SLEEP APNEA 12/03/2007   Use C-PAP   PERIPHERAL NEUROPATHY 07/26/2006   Pernicious anemia 11/20/2006   PULMONARY EMBOLISM 10/05/2007   Syncope and collapse 10/30/2020   zio patch 11/2020: Predominant sinus bradycardia with an average rate of 54 betas per minute.  Type I second degree heart block was occasionally present w/o significant pauses. Short lived SVT x1 with occasional symptomatic  PVCs, ventricular trigeminy present     Varicella zoster 01/29/2023   Vertigo    Past Surgical History:  Past Surgical History:  Procedure Laterality Date   APPENDECTOMY  1968   BACK SURGERY  1977, 06/14/2008   Dr. Gwendlyn Lemmings at [facility] (06/10)   CARDIAC CATHETERIZATION     CARDIOVERSION N/A 03/11/2023   Procedure: CARDIOVERSION;  Surgeon: Devorah Fonder, MD;  Location: [facility];  Service: Cardiovascular;  Laterality: N/A;   CARPAL TUNNEL RELEASE Right 10/2017   CATARACT EXTRACTION W/PHACO Left 05/30/2014   Procedure: CATARACT EXTRACTION PHACO AND INTRAOCULAR LENS PLACEMENT (IOC);  Surgeon: Steven Dingeldein, MD;  Location: [facility];  Service: Ophthalmology;  Laterality: Left;  US  01:20 AP% 23.7 CDE 31.86   CATARACT EXTRACTION W/PHACO Right 09/06/2021   Procedure: CATARACT EXTRACTION PHACO AND INTRAOCULAR LENS PLACEMENT (IOC) RIGHT DIABETIC VISION BLUE OMIDRIA  MALYUGIN IRIS HOOKS;  Surgeon: Trudi Fus, MD;  Location: [facility];  Service: Ophthalmology;  Laterality: Right;  14.65 2.11.1   COLONOSCOPY  10/2002   HP, SSA, TA, rpt 3 yrs ([place])   COLONOSCOPY  01/2005   diverticulosis rpt 5 yrs ([facility])    EPIDURAL BLOCK INJECTION Bilateral 06/2021   bilat L5 nerve block with benefit ([place])   INCISION AND DRAINAGE PERIRECTAL ABSCESS N/A 01/08/2023   Procedure: IRRIGATION AND DEBRIDEMENT SACRAL WOUND;  Surgeon: Barrett Lick, MD;  Location: [facility];  Service: General;  Laterality: N/A;   IR NEPHROSTOMY PLACEMENT LEFT  10/19/2018   IR URETERAL STENT LEFT NEW ACCESS W/O SEP NEPHROSTOMY CATH  10/19/2018   LEFT HEART CATH AND CORONARY ANGIOGRAPHY N/A 11/01/2020   Procedure: LEFT HEART CATH AND CORONARY ANGIOGRAPHY;  Surgeon: Sammy Crisp, MD;  Location: [facility];  Service: Cardiovascular;  Laterality: N/A;   LITHOTRIPSY     X 2   NEPHROLITHOTOMY Left 10/19/2018   Procedure: NEPHROLITHOTOMY PERCUTANEOUS;  Surgeon: Trent Frizzle, MD;   Location: [facility];  Service: Urology;  Laterality: Left;  3 HRS   PARATHYROIDECTOMY Right 04/18/2016   PARATHYROIDECTOMY for cyst Donald Clayman, MD)   SHOULDER ARTHROSCOPY W/ ROTATOR CUFF REPAIR Right    TONSILLECTOMY     Social History:  reports that he has never smoked. He has never been exposed to tobacco smoke. He has never used smokeless tobacco. He reports that he does not drink alcohol and does not use drugs. Family History:  Family History  Problem Relation Age of Onset   Cancer Mother        Breast Cancer   Cancer Sister        Breast Cancer   Diabetes Father    Heart disease Father    Diabetes Paternal Grandfather      HOME MEDICATIONS: Allergies as of 06/24/2023       Reactions   Codeine  Other (See Comments)   REACTION: chest pain   Pioglitazone Other (See Comments)  REACTION to Actos: swelling in ankles        Medication List        Accurate as of June 24, 2023  1:10 PM. If you have any questions, ask your nurse or doctor.          albuterol  108 (90 Base) MCG/ACT inhaler Commonly known as: VENTOLIN  HFA Inhale 2 puffs into the lungs every 6 (six) hours as needed for wheezing or shortness of breath.   allopurinol  300 MG tablet Commonly known as: ZYLOPRIM  TAKE ONE TABLET BY MOUTH EVERY DAY   colchicine  0.6 MG tablet TAKE 1 TABLET BY MOUTH DAILY AS NEEDED FOR GOUT GLARE. ON FIRST DAY OF GOUT FLARE,MAY TAKE 2 TABLETS AT ONCE AS DIRECTED   cyanocobalamin  1000 MCG/ML injection Commonly known as: VITAMIN B12 INJECT 1ML INTRAMUSCULARLY EVERY 30 DAYS   cyclobenzaprine  5 MG tablet Commonly known as: FLEXERIL  Take 1 tablet (5 mg total) by mouth at bedtime as needed for muscle spasms.   doxazosin  1 MG tablet Commonly known as: CARDURA  TAKE ONE TABLET BY MOUTH TWICE DAILY   Eliquis  5 MG Tabs tablet Generic drug: apixaban  TAKE ONE (1) TABLET BY MOUTH TWO TIMES PER DAY   FreeStyle Libre 2 Sensor Misc 1 Device by Does not apply route every 14  (fourteen) days.   furosemide  80 MG tablet Commonly known as: LASIX  TAKE 1 TABLET BY MOUTH EVERY DAY. MAY TAKE AN EXTRA PILL FOR SWELLING.   gabapentin  600 MG tablet Commonly known as: NEURONTIN  TAKE TWO TABLETS AT BEDTIME   GE100 Blood Glucose Test test strip Generic drug: glucose blood TEST FOUR TIMES DAILY   HYDROcodone -acetaminophen  10-325 MG tablet Commonly known as: NORCO Take 1 tablet by mouth every 6 (six) hours as needed for moderate pain ((typically once to twice daily if needed)).   insulin  regular 100 units/mL injection Commonly known as: NovoLIN  R INJECT 60 UNITS BEFORE BRUNCH AND SUPPER. ADD EXTRA UNITS ON INSULIN  TO YOUR MEAL IF YOUR BLOOD SUGARS ARE HIGHER THAN 155   isosorbide  mononitrate 30 MG 24 hr tablet Commonly known as: IMDUR  TAKE ONE TABLET BY MOUTH DAILY   losartan  25 MG tablet Commonly known as: COZAAR  Take 1 tablet (25 mg total) by mouth at bedtime.   lovastatin  40 MG tablet Commonly known as: MEVACOR  TAKE TWO TABLETS BY MOUTH AT BEDTIME   meclizine 25 MG tablet Commonly known as: ANTIVERT Take 25 mg by mouth 3 (three) times daily as needed for dizziness.   mupirocin ointment 2 % Commonly known as: BACTROBAN Apply 1 Application topically daily as needed (wound care).   nitroGLYCERIN  0.4 MG SL tablet Commonly known as: NITROSTAT  PLACE 1 TABLET UNDER TONGUE EVERY 5 MIN AS NEEDED FOR CHEST PAIN IF NO RELIEF IN15 MIN CALL 911 (MAX 3 TABS)   NovoLIN  N 100 UNIT/ML injection Generic drug: insulin  NPH Human Novolin -N 80 units at  bedtime ( If fasting sugar are below 100, decrease by 10 points and stay on the new dose)   OPCON-A  OP Place 1 drop into both eyes 3 (three) times daily as needed (itchy eyes.).   Ozempic  (0.25 or 0.5 MG/DOSE) 2 MG/1.5ML Sopn Generic drug: Semaglutide (0.25 or 0.5MG /DOS) Inject 0.25 mg into the skin once a week.   STOOL SOFTENER & LAXATIVE PO Take 2 tablets by mouth at bedtime.   SYRINGE 3CC/22GX1-1/2" 22G X  1-1/2" 3 ML Misc Use to administer monthly b12 shots   Vitamin D3 25 MCG (1000 UT) Caps Take 1 capsule (1,000 Units total)  by mouth daily.         OBJECTIVE:   Vital Signs: BP 124/72 (BP Location: Right Arm, Patient Position: Sitting, Cuff Size: Normal)   Pulse 61   Ht 5\' 5"  (1.651 m)   Wt 235 lb (106.6 kg)   SpO2 97%   BMI 39.11 kg/m   Wt Readings from Last 3 Encounters:  06/24/23 235 lb (106.6 kg)  06/17/23 231 lb 3.2 oz (104.9 kg)  05/06/23 237 lb 12.8 oz (107.9 kg)    Exam: General: Pt appears well and is in NAD  Lungs: Clear with good BS bilat   Heart: RRR   Extremities: Trace  pretibial edema.   Neuro: MS is good with appropriate affect, pt is alert and Ox3   Dm Foot Exam : 06/24/2023  The skin of the feet is intact without sores or ulcerations. The pedal pulses are 1+ on right and 1+ on left. The sensation is decreased  to a screening 5.07, 10 gram monofilament bilaterally    DATA REVIEWED:  Lab Results  Component Value Date   HGBA1C 7.2 (A) 06/24/2023   HGBA1C 6.6 (A) 02/19/2023   HGBA1C 7.3 (A) 01/01/2023    Latest Reference Range & Units 01/20/23 14:39  Sodium 135 - 145 mEq/L 141  Potassium 3.5 - 5.1 mEq/L 4.3  Chloride 96 - 112 mEq/L 102  CO2 19 - 32 mEq/L 31  Glucose 70 - 99 mg/dL 914 (H)  BUN 6 - 23 mg/dL 32 (H)  Creatinine 7.82 - 1.50 mg/dL 9.56  Calcium  8.4 - 10.5 mg/dL 8.8  Phosphorus 2.3 - 4.6 mg/dL 3.7  Albumin  3.5 - 5.2 g/dL 3.2 (L)  GFR >21.30 mL/min 48.49 (L)    Old records , labs and images have been reviewed.   ASSESSMENT / PLAN / RECOMMENDATIONS:   1) Type 2 Diabetes Mellitus, Optimally  controlled, With neuropathic, CKD III complications - Most recent A1c of 7.2%. Goal A1c < 7.0 %.    -A1c is optimal -Patient has been noted with hypoglycemia on CGM download -The patient changes his NPH and will take between 80 and 160units.  I did discourage the patient from fluctuating insulin  intake.  He will be given a set dose, that he  may reduce by 10 points if his fasting BG's are below 100 Mg/DL -Emphasized the importance of taking regular insulin  20 minutes before the meal rather than after the meal -I will change her sensitivity factor -He has been off Ozempic  for approximately 2 months because he was in the coverage gap.  Would like to restart.  He was provided with #1 sample pen to start at a small dose and gradually increase to 1 mg   MEDICATIONS: Restart Ozempic  0.25 mg weekly for 4 weeks, then increase to 0.5 mg weekly then 1 mg weekly Novolin -N 120 units QHS Novolin -R 120 units before brunch and supper Change correction scale: Novolin -R (BG -130/15)  EDUCATION / INSTRUCTIONS: BG monitoring instructions: Patient is instructed to check his blood sugars 3 times a day, before each meal. Call [facility] if: BG persistently < 70  I reviewed the Rule of 15 for the treatment of hypoglycemia in detail with the patient. Literature supplied.    2) Diabetic complications:  Eye: Does not have known diabetic retinopathy.  Neuro/ Feet: Does  have known diabetic peripheral neuropathy .  Renal: Patient does  have known baseline CKD. He   is  on an ACEI/ARB at present.     F/U in 4  months     Signed electronically by: Natale Bail, MD  [facility] Endocrin[facility]  [facility] [address] [place], [place] [address] Phone: [phone] FAX: [phone]   CC: Claire Crick, MD [address] [place] [address] Phone: [phone]  Fax: [phone]  Return to Endocrinology clinic as below: Future Appointments  Date Time Provider Department Center  07/09/2023  3:30 PM Claire Crick, MD [facility]  07/29/2023 11:20 AM Gennaro Khat, Cadence H, PA-C [facility] None  08/05/2023 10:30 AM Riddle, Suzann, NP [facility] None  09/04/2023  2:20 PM [facility] ANNUAL WELLNESS VISIT 1 [facility]

## 2023-06-24 NOTE — Patient Instructions (Addendum)
 Continue Ozempic  1 mg weekly  Novolin -N 100 units at  bedtime Novolin -R  90 units  before brunch and Supper  Novolin -R correctional insulin : ADD extra units on insulin  to your meal-time Novolin - R dose if your blood sugars are higher than 155. Use the scale below to help guide you:   Blood sugar before meal Number of units to inject  Less than 145 0 unit  146 - 160 1 units  161 - 175 2 units  176 - 190 3 units  191 - 205 4 units  206 - 220 5 units  221 - 235 6 units  236 - 250 7 units   251 - 265 8 units      HOW TO TREAT LOW BLOOD SUGARS (Blood sugar LESS THAN 70 MG/DL) Please follow the RULE OF 15 for the treatment of hypoglycemia treatment (when your (blood sugars are less than 70 mg/dL)   STEP 1: Take 15 grams of carbohydrates when your blood sugar is low, which includes:  3-4 GLUCOSE TABS  OR 3-4 OZ OF JUICE OR REGULAR SODA OR ONE TUBE OF GLUCOSE GEL    STEP 2: RECHECK blood sugar in 15 MINUTES STEP 3: If your blood sugar is still low at the 15 minute recheck --> then, go back to STEP 1 and treat AGAIN with another 15 grams of carbohydrates.

## 2023-06-25 DIAGNOSIS — N2581 Secondary hyperparathyroidism of renal origin: Secondary | ICD-10-CM | POA: Diagnosis not present

## 2023-06-25 DIAGNOSIS — D351 Benign neoplasm of parathyroid gland: Secondary | ICD-10-CM | POA: Diagnosis not present

## 2023-06-25 DIAGNOSIS — D631 Anemia in chronic kidney disease: Secondary | ICD-10-CM | POA: Diagnosis not present

## 2023-06-25 DIAGNOSIS — E1122 Type 2 diabetes mellitus with diabetic chronic kidney disease: Secondary | ICD-10-CM | POA: Diagnosis not present

## 2023-06-25 DIAGNOSIS — M109 Gout, unspecified: Secondary | ICD-10-CM | POA: Diagnosis not present

## 2023-06-25 DIAGNOSIS — N1831 Chronic kidney disease, stage 3a: Secondary | ICD-10-CM | POA: Diagnosis not present

## 2023-06-25 DIAGNOSIS — G4733 Obstructive sleep apnea (adult) (pediatric): Secondary | ICD-10-CM | POA: Diagnosis not present

## 2023-06-25 DIAGNOSIS — N200 Calculus of kidney: Secondary | ICD-10-CM | POA: Diagnosis not present

## 2023-06-25 DIAGNOSIS — I129 Hypertensive chronic kidney disease with stage 1 through stage 4 chronic kidney disease, or unspecified chronic kidney disease: Secondary | ICD-10-CM | POA: Diagnosis not present

## 2023-06-26 ENCOUNTER — Other Ambulatory Visit: Payer: Self-pay | Admitting: Cardiovascular Disease

## 2023-06-29 ENCOUNTER — Other Ambulatory Visit: Payer: Self-pay | Admitting: Internal Medicine

## 2023-07-09 ENCOUNTER — Encounter: Payer: Self-pay | Admitting: Family Medicine

## 2023-07-09 ENCOUNTER — Ambulatory Visit (INDEPENDENT_AMBULATORY_CARE_PROVIDER_SITE_OTHER): Admitting: Family Medicine

## 2023-07-09 VITALS — BP 128/64 | HR 59 | Temp 98.6°F | Ht 62.75 in | Wt 238.0 lb

## 2023-07-09 DIAGNOSIS — E538 Deficiency of other specified B group vitamins: Secondary | ICD-10-CM

## 2023-07-09 DIAGNOSIS — K5909 Other constipation: Secondary | ICD-10-CM

## 2023-07-09 DIAGNOSIS — E785 Hyperlipidemia, unspecified: Secondary | ICD-10-CM

## 2023-07-09 DIAGNOSIS — N183 Chronic kidney disease, stage 3 unspecified: Secondary | ICD-10-CM

## 2023-07-09 DIAGNOSIS — I739 Peripheral vascular disease, unspecified: Secondary | ICD-10-CM | POA: Diagnosis not present

## 2023-07-09 DIAGNOSIS — Z125 Encounter for screening for malignant neoplasm of prostate: Secondary | ICD-10-CM

## 2023-07-09 DIAGNOSIS — J309 Allergic rhinitis, unspecified: Secondary | ICD-10-CM

## 2023-07-09 DIAGNOSIS — E1165 Type 2 diabetes mellitus with hyperglycemia: Secondary | ICD-10-CM | POA: Diagnosis not present

## 2023-07-09 DIAGNOSIS — E559 Vitamin D deficiency, unspecified: Secondary | ICD-10-CM | POA: Diagnosis not present

## 2023-07-09 DIAGNOSIS — E1122 Type 2 diabetes mellitus with diabetic chronic kidney disease: Secondary | ICD-10-CM | POA: Diagnosis not present

## 2023-07-09 DIAGNOSIS — M1A9XX0 Chronic gout, unspecified, without tophus (tophi): Secondary | ICD-10-CM

## 2023-07-09 DIAGNOSIS — Z0000 Encounter for general adult medical examination without abnormal findings: Secondary | ICD-10-CM | POA: Diagnosis not present

## 2023-07-09 DIAGNOSIS — Z7189 Other specified counseling: Secondary | ICD-10-CM | POA: Diagnosis not present

## 2023-07-09 DIAGNOSIS — I350 Nonrheumatic aortic (valve) stenosis: Secondary | ICD-10-CM

## 2023-07-09 DIAGNOSIS — Z794 Long term (current) use of insulin: Secondary | ICD-10-CM

## 2023-07-09 DIAGNOSIS — I10 Essential (primary) hypertension: Secondary | ICD-10-CM

## 2023-07-09 DIAGNOSIS — M545 Low back pain, unspecified: Secondary | ICD-10-CM

## 2023-07-09 DIAGNOSIS — G8929 Other chronic pain: Secondary | ICD-10-CM

## 2023-07-09 DIAGNOSIS — E1169 Type 2 diabetes mellitus with other specified complication: Secondary | ICD-10-CM

## 2023-07-09 DIAGNOSIS — I480 Paroxysmal atrial fibrillation: Secondary | ICD-10-CM | POA: Diagnosis not present

## 2023-07-09 DIAGNOSIS — E66813 Obesity, class 3: Secondary | ICD-10-CM

## 2023-07-09 MED ORDER — ALLOPURINOL 300 MG PO TABS: 300.0000 mg | ORAL_TABLET | Freq: Every day | ORAL | 3 refills | Status: AC

## 2023-07-09 MED ORDER — GABAPENTIN 600 MG PO TABS: 1200.0000 mg | ORAL_TABLET | Freq: Every day | ORAL | 3 refills | Status: AC

## 2023-07-09 NOTE — Assessment & Plan Note (Addendum)
 Advanced directive discussion - has at home. HCPOA would be son/wife. Asked to bring us  a copy.

## 2023-07-09 NOTE — Assessment & Plan Note (Signed)

## 2023-07-09 NOTE — Progress Notes (Unsigned)
 Ph: [phone] Fax: [phone]   Patient ID: Donald [name], male    DOB: Oct 26, 1945, 78 y.o.   MRN: [id number]  This visit was conducted in person.  BP 128/64   Pulse (!) 59   Temp 98.6 F (37 C) (Oral)   Ht 5' 2.75 (1.594 m)   Wt 238 lb (108 kg)   SpO2 96%   BMI 42.50 kg/m    CC: CPE Subjective:   HPI: TAB Donald [name] is a 78 y.o. male presenting on 07/09/2023 for Annual Exam (MCR prt 2 [AWV scheduled for 09/04/23]. )   Did not see health advisor this year - has AMW scheduled for 09/04/2023. Requests this done today.  No results found.  Flowsheet Row Office Visit from 07/09/2023 in [facility] [place]  PHQ-2 Total Score 0       07/09/2023    4:05 PM 04/21/2023    2:17 PM 01/01/2023   12:25 PM 11/07/2022   11:51 AM 08/19/2022   11:04 AM  Fall Risk   Falls in the past year? 1 1 1  0 0  Number falls in past yr: 1 1 1  0   Injury with Fall? 1 0 0 0   Risk for fall due to :    No Fall Risks   Follow up    Falls evaluation completed    Not fasting today - ate 2 pieces of toast 1 hr ago.   He and wife saw neuropathy clinic in [place].   DM - sees Dr Sam on high dose insulin  and ozempic  1mg  weekly - tolerating well without side effects. Ongoing lows - 55 yesterday. He drinks a coke or eats a cookie when this happens.    CAD - sees Dr Perla and EP DR Kennyth on eliquis . Afib developed 12/2022, s/p cardioversion. Finds eliquis  stays very expensive.   Chronic back and L>>R leg pain worse with ambulation - sees Dr Bonner PM&R. Last injection wasn't very helpful. Nsg Dr Louis didn't recommend surgery - planning to see [facility] in [place] for second opinion - has appt next week.   Last b12 shot was on Sunday.   Care Team: Cardiology - Dr Perla EP - Dr Kennyth Endocrinology - Dr [facility] Nephrology - Dr Rayburn Urology - Dr Matilda (kidney stones) PM&R - Dr Bonner  ENT - Dr Milissa Podiatry - Dr Tobie Domino - Dr  Dela  Sleep - Dr Jude   Preventative: Colon cancer screening - colonoscopy 2006 with hyperplastic polyps ([place]). Cologuard normal 01/2016, 09/2020. Requests updated.  Prostate cancer screening - yearly PSA. no BPH symptoms.  Lung cancer screening - not eligible  Flu shot - yearly  COVID vaccine Pfizer 02/2019 x2, Pfizer booster 10/2019  Td 04/2008, Tdap 01/2023 RSV 02/2023 Pneumovax 2009, 2004, 2020. Prevnar-13 2016 Zostavax - received twice 2011, 2013 Shingrix - discussed, to check at pharmacy  Advanced directive discussion - has at home. HCPOA would be son/wife. Asked to bring us  a copy.  Seat belt use discussed.  Sunscreen use discussed. No changing moles on skin. Sees Dr Dela.  Smoking - none Alcohol -  none Dentist - q6 mo (Dr Rumalda in [place])  Eye exam - yearly ([place]ing[place]dein -> [place]) s/p bilateral cataract surgery  Bowel - constipation managed with senna/docusate 2 pills nightly Bladder - no incontinence    Married ([place]) Grown children (son Donald [name]) Worked biological [employer]-disabled due to back pain. Activity: limited by medical problems Diet: good water,  fruits/vegetables daily     Relevant past medical, surgical, family and social history reviewed and updated as indicated. Interim medical history since our last visit reviewed. Allergies and medications reviewed and updated. Outpatient Medications Prior to Visit  Medication Sig Dispense Refill   albuterol  (VENTOLIN  HFA) 108 (90 Base) MCG/ACT inhaler Inhale 2 puffs into the lungs every 6 (six) hours as needed for wheezing or shortness of breath. 8 g 0   Cholecalciferol (VITAMIN D3) 25 MCG (1000 UT) CAPS Take 1 capsule (1,000 Units total) by mouth daily. 30 capsule    colchicine  0.6 MG tablet TAKE 1 TABLET BY MOUTH DAILY AS NEEDED FOR GOUT GLARE. ON FIRST DAY OF GOUT FLARE,MAY TAKE 2 TABLETS AT ONCE AS DIRECTED 30 tablet 0   Continuous Glucose Sensor (FREESTYLE LIBRE 2 SENSOR) MISC 1 Device by Does not apply route every  14 (fourteen) days. 6 each 3   cyanocobalamin  (VITAMIN B12) 1000 MCG/ML injection INJECT 1ML INTRAMUSCULARLY EVERY 30 DAYS 1 mL 6   doxazosin  (CARDURA ) 1 MG tablet TAKE ONE TABLET BY MOUTH TWICE DAILY 180 tablet 3   furosemide  (LASIX ) 80 MG tablet TAKE 1 TABLET BY MOUTH EVERY DAY. MAY TAKE AN EXTRA PILL FOR SWELLING. 90 tablet 0   glucose blood (GE100 BLOOD GLUCOSE TEST) test strip TEST FOUR TIMES DAILY 200 each 10   HYDROcodone -acetaminophen  (NORCO) 10-325 MG tablet Take 1 tablet by mouth every 6 (six) hours as needed for moderate pain ((typically once to twice daily if needed)).  30 tablet 0   insulin  regular (NOVOLIN  R) 100 units/mL injection INJECT 60 UNITS BEFORE BRUNCH AND SUPPER. ADD EXTRA UNITS ON INSULIN  TO YOUR MEAL IF YOUR BLOOD SUGARS ARE HIGHER THAN 155     isosorbide  mononitrate (IMDUR ) 30 MG 24 hr tablet TAKE ONE TABLET BY MOUTH DAILY 90 tablet 3   losartan  (COZAAR ) 25 MG tablet Take 1 tablet (25 mg total) by mouth at bedtime. 90 tablet 3   lovastatin  (MEVACOR ) 40 MG tablet TAKE TWO TABLETS BY MOUTH AT BEDTIME 180 tablet 0   meclizine (ANTIVERT) 25 MG tablet Take 25 mg by mouth 3 (three) times daily as needed for dizziness.     Naphazoline-Pheniramine (OPCON-A  OP) Place 1 drop into both eyes 3 (three) times daily as needed (itchy eyes.).      nitroGLYCERIN  (NITROSTAT ) 0.4 MG SL tablet PLACE 1 TABLET UNDER TONGUE EVERY 5 MIN AS NEEDED FOR CHEST PAIN IF NO RELIEF IN15 MIN CALL 911 (MAX 3 TABS) 25 tablet 11   NOVOLIN  N 100 UNIT/ML injection Novolin -N 100 units QHS 100 mL 0   Semaglutide , 1 MG/DOSE, 4 MG/3ML SOPN Inject 1 mg as directed once a week.     Sennosides-Docusate Sodium  (STOOL SOFTENER & LAXATIVE PO) Take 2 tablets by mouth at bedtime.     Syringe/Needle, Disp, (SYRINGE 3CC/22GX1-1/2) 22G X 1-1/2 3 ML MISC Use to administer monthly b12 shots 50 each 0   allopurinol  (ZYLOPRIM ) 300 MG tablet TAKE ONE TABLET BY MOUTH EVERY DAY 90 tablet 4   apixaban  (ELIQUIS ) 5 MG TABS tablet  TAKE ONE (1) TABLET BY MOUTH TWO TIMES PER DAY 60 tablet 5   cyclobenzaprine  (FLEXERIL ) 5 MG tablet Take 1 tablet (5 mg total) by mouth at bedtime as needed for muscle spasms. 30 tablet 0   gabapentin  (NEURONTIN ) 600 MG tablet TAKE TWO TABLETS AT BEDTIME 180 tablet 3   mupirocin ointment (BACTROBAN) 2 % Apply 1 Application topically daily as needed (wound care). (Patient not taking: Reported on  06/24/2023)     No facility-administered medications prior to visit.     Per HPI unless specifically indicated in ROS section below Review of Systems  Constitutional:  Negative for activity change, appetite change, chills, fatigue, fever and unexpected weight change.  HENT:  Positive for rhinorrhea (every morning - despite OTC antihistamine). Negative for hearing loss.   Eyes:  Negative for visual disturbance.  Respiratory:  Negative for cough, chest tightness, shortness of breath and wheezing.   Cardiovascular:  Negative for chest pain, palpitations and leg swelling.  Gastrointestinal:  Negative for abdominal distention, abdominal pain, blood in stool, constipation, diarrhea, nausea and vomiting.  Genitourinary:  Negative for difficulty urinating and hematuria.  Musculoskeletal:  Negative for arthralgias, myalgias and neck pain.  Skin:  Negative for rash.  Neurological:  Negative for dizziness, seizures, syncope and headaches.  Hematological:  Negative for adenopathy. Does not bruise/bleed easily.  Psychiatric/Behavioral:  Negative for dysphoric mood. The patient is not nervous/anxious.     Objective:  BP 128/64   Pulse (!) 59   Temp 98.6 F (37 C) (Oral)   Ht 5' 2.75 (1.594 m)   Wt 238 lb (108 kg)   SpO2 96%   BMI 42.50 kg/m   Wt Readings from Last 3 Encounters:  07/09/23 238 lb (108 kg)  06/24/23 235 lb (106.6 kg)  06/17/23 231 lb 3.2 oz (104.9 kg)      Physical Exam Vitals and nursing note reviewed.  Constitutional:      General: He is not in acute distress.    Appearance:  Normal appearance. He is well-developed. He is not ill-appearing.  HENT:     Head: Normocephalic and atraumatic.     Right Ear: Hearing, tympanic membrane, ear canal and external ear normal.     Left Ear: Hearing, tympanic membrane, ear canal and external ear normal.     Mouth/Throat:     Mouth: Mucous membranes are moist.     Pharynx: Oropharynx is clear. No oropharyngeal exudate or posterior oropharyngeal erythema.   Eyes:     General: No scleral icterus.    Extraocular Movements: Extraocular movements intact.     Conjunctiva/sclera: Conjunctivae normal.     Pupils: Pupils are equal, round, and reactive to light.   Neck:     Thyroid : No thyroid  mass or thyromegaly.   Cardiovascular:     Rate and Rhythm: Normal rate and regular rhythm.     Pulses: Normal pulses.          Radial pulses are 2+ on the right side and 2+ on the left side.     Heart sounds: Normal heart sounds. No murmur heard. Pulmonary:     Effort: Pulmonary effort is normal. No respiratory distress.     Breath sounds: Normal breath sounds. No wheezing, rhonchi or rales.  Abdominal:     General: Bowel sounds are normal. There is no distension.     Palpations: Abdomen is soft. There is no mass.     Tenderness: There is no abdominal tenderness. There is no guarding or rebound.     Hernia: No hernia is present.   Musculoskeletal:        General: Normal range of motion.     Cervical back: Normal range of motion and neck supple.     Right lower leg: No edema.     Left lower leg: No edema.  Lymphadenopathy:     Cervical: No cervical adenopathy.   Skin:    General: Skin is  warm and dry.     Findings: No rash.   Neurological:     General: No focal deficit present.     Mental Status: He is alert and oriented to person, place, and time.     Comments:  Recall 3/3 Calculation 5/5 DLROW  Psychiatric:        Mood and Affect: Mood normal.        Behavior: Behavior normal.        Thought Content: Thought content  normal.        Judgment: Judgment normal.       Results for orders placed or performed in visit on 07/09/23  Lipid panel   Collection Time: 07/09/23  4:05 PM  Result Value Ref Range   Cholesterol 86 0 - 200 mg/dL   Triglycerides 867.9 0.0 - 149.0 mg/dL   HDL 72.99 (L) >60.99 mg/dL   VLDL 73.5 0.0 - 59.9 mg/dL   LDL Cholesterol 33 0 - 99 mg/dL   Total CHOL/HDL Ratio 3    NonHDL 59.03   Comprehensive metabolic panel with GFR   Collection Time: 07/09/23  4:05 PM  Result Value Ref Range   Sodium 142 135 - 145 mEq/L   Potassium 4.5 3.5 - 5.1 mEq/L   Chloride 101 96 - 112 mEq/L   CO2 32 19 - 32 mEq/L   Glucose, Bld 231 (H) 70 - 99 mg/dL   BUN 38 (H) 6 - 23 mg/dL   Creatinine, Ser 8.14 (H) 0.40 - 1.50 mg/dL   Total Bilirubin 1.3 (H) 0.2 - 1.2 mg/dL   Alkaline Phosphatase 93 39 - 117 U/L   AST 9 0 - 37 U/L   ALT 8 0 - 53 U/L   Total Protein 5.8 (L) 6.0 - 8.3 g/dL   Albumin  3.8 3.5 - 5.2 g/dL   GFR 64.81 (L) >39.99 mL/min   Calcium  9.0 8.4 - 10.5 mg/dL  TSH   Collection Time: 07/09/23  4:05 PM  Result Value Ref Range   TSH 3.11 0.35 - 5.50 uIU/mL  PSA   Collection Time: 07/09/23  4:05 PM  Result Value Ref Range   PSA 0.73 0.10 - 4.00 ng/mL  Phosphorus   Collection Time: 07/09/23  4:05 PM  Result Value Ref Range   Phosphorus 3.8 2.3 - 4.6 mg/dL  Microalbumin / creatinine urine ratio   Collection Time: 07/09/23  4:05 PM  Result Value Ref Range   Microalb, Ur 3.6 (H) 0.0 - 1.9 mg/dL   Creatinine,U 67.5 mg/dL   Microalb Creat Ratio 110.3 (H) 0.0 - 30.0 mg/g  Parathyroid  hormone, intact (no Ca)   Collection Time: 07/09/23  4:05 PM  Result Value Ref Range   PTH 63 16 - 77 pg/mL  CBC with Differential/Platelet   Collection Time: 07/09/23  4:05 PM  Result Value Ref Range   WBC 7.1 4.0 - 10.5 K/uL   RBC 4.07 (L) 4.22 - 5.81 Mil/uL   Hemoglobin 11.9 (L) 13.0 - 17.0 g/dL   HCT 64.0 (L) 60.9 - 47.9 %   MCV 88.2 78.0 - 100.0 fl   MCHC 33.0 30.0 - 36.0 g/dL   RDW 81.6 (H)  88.4 - 15.5 %   Platelets 162.0 150.0 - 400.0 K/uL   Neutrophils Relative % 71.0 43.0 - 77.0 %   Lymphocytes Relative 17.8 12.0 - 46.0 %   Monocytes Relative 6.8 3.0 - 12.0 %   Eosinophils Relative 3.1 0.0 - 5.0 %   Basophils Relative 1.3 0.0 - 3.0 %  Neutro Abs 5.0 1.4 - 7.7 K/uL   Lymphs Abs 1.3 0.7 - 4.0 K/uL   Monocytes Absolute 0.5 0.1 - 1.0 K/uL   Eosinophils Absolute 0.2 0.0 - 0.7 K/uL   Basophils Absolute 0.1 0.0 - 0.1 K/uL  Uric acid   Collection Time: 07/09/23  4:05 PM  Result Value Ref Range   Uric Acid, Serum 5.7 4.0 - 7.8 mg/dL  Vitamin B12   Collection Time: 07/09/23  4:05 PM  Result Value Ref Range   Vitamin B-12 729 211 - 911 pg/mL  VITAMIN D  25 Hydroxy (Vit-D Deficiency, Fractures)   Collection Time: 07/09/23  4:05 PM  Result Value Ref Range   VITD 75.48 30.00 - 100.00 ng/mL   *Note: Due to a large number of results and/or encounters for the requested time period, some results have not been displayed. A complete set of results can be found in Results Review.    Assessment & Plan:   Problem List Items Addressed This Visit     Medicare annual wellness visit, subsequent - Primary (Chronic)   I have personally reviewed the Medicare Annual Wellness questionnaire and have noted 1. The patient's medical and social history 2. Their use of alcohol, tobacco or illicit drugs 3. Their current medications and supplements 4. The patient's functional ability including ADL's, fall risks, home safety risks and hearing or visual impairment. Cognitive function has been assessed and addressed as indicated.  5. Diet and physical activity 6. Evidence for depression or mood disorders The patients weight, height, BMI have been recorded in the chart. I have made referrals, counseling and provided education to the patient based on review of the above and I have provided the pt with a written personalized care plan for preventive services. Provider list updated.. See scanned  questionairre as needed for further documentation. Reviewed preventative protocols and updated unless pt declined.       Advanced care planning/counseling discussion (Chronic)   Advanced directive discussion - has at home. HCPOA would be son/wife. Asked to bring us  a copy.       Health maintenance examination (Chronic)   Preventative protocols reviewed and updated unless pt declined. Discussed healthy diet and lifestyle.  Discussed possibly stopping PSA screening next year.       Dyslipidemia associated with type 2 diabetes mellitus (HCC)   Chronic, continue lovastatin . Update FLP./ The ASCVD Risk score (Arnett DK, et al., 2019) failed to calculate for the following reasons:   Risk score cannot be calculated because patient has a medical history suggesting prior/existing ASCVD       Relevant Medications   Semaglutide , 1 MG/DOSE, 4 MG/3ML SOPN   Other Relevant Orders   Lipid panel (Completed)   Comprehensive metabolic panel with GFR (Completed)   Essential hypertension   Chronic, stable. Continue current regimen.       Allergic rhinitis   Ongoing rhinitis worse in am and pm.  Suggested try different antihistamine brand.       Chronic low back pain   Sees Dr Bonner, has seen Dr Louis. Pending eval by [facility] in [place]      Relevant Medications   gabapentin  (NEURONTIN ) 600 MG tablet   Obesity, Class III, BMI 40-49.9 (morbid obesity)   Continue ozempic , continue healthy lifestyle choices.       Relevant Medications   Semaglutide , 1 MG/DOSE, 4 MG/3ML SOPN   CKD stage 3 due to type 2 diabetes mellitus [facility])   Sees nephrology.  Update GFR.  Relevant Medications   Semaglutide , 1 MG/DOSE, 4 MG/3ML SOPN   Other Relevant Orders   Phosphorus (Completed)   Microalbumin / creatinine urine ratio (Completed)   Parathyroid  hormone, intact (no Ca) (Completed)   CBC with Differential/Platelet (Completed)   PAD (peripheral artery disease) (HCC)   Continue statin       Chronic constipation   Continues senna /docusate 2 pills daily      Chronic gout   Chronic, stable on allopurinol  without recent gout flare.      Relevant Medications   allopurinol  (ZYLOPRIM ) 300 MG tablet   Other Relevant Orders   Uric acid (Completed)   Vitamin D  deficiency   Update levels on vitamin D  1000 units daily      Relevant Orders   VITAMIN D  25 Hydroxy (Vit-D Deficiency, Fractures) (Completed)   Vitamin B12 deficiency   Update levels on monthly B12 shots done at home.  He states he just received latest B12 shot this past weekend.       Relevant Orders   Vitamin B12 (Completed)   Aortic stenosis   No aortic stenosis on latest echocardiogram 2025      Paroxysmal atrial fibrillation (HCC)   Chronic, continues eliquis  5mg  bid, followed by cardiology/EP. Notes this is an expensive medication      Relevant Orders   TSH (Completed)   CBC with Differential/Platelet (Completed)   Uncontrolled type 2 diabetes mellitus with hyperglycemia, with long-term current use of insulin  (HCC)   Chronic, followed by endo on high dose insulin  as well as ozempic  - appreciate Dr [facility] care.  Looks like ozempic  fell off med list - will update.  He notes some low sugars despite recent insulin  dose drop - encouraged notify endo if ongoing.       Relevant Medications   Semaglutide , 1 MG/DOSE, 4 MG/3ML SOPN   Other Visit Diagnoses       Special screening for malignant neoplasm of prostate       Relevant Orders   PSA (Completed)        Meds ordered this encounter  Medications   allopurinol  (ZYLOPRIM ) 300 MG tablet    Sig: Take 1 tablet (300 mg total) by mouth daily.    Dispense:  90 tablet    Refill:  3   gabapentin  (NEURONTIN ) 600 MG tablet    Sig: Take 2 tablets (1,200 mg total) by mouth at bedtime.    Dispense:  180 tablet    Refill:  3    Orders Placed This Encounter  Procedures   Lipid panel   Comprehensive metabolic panel with GFR   TSH   PSA    Phosphorus   Microalbumin / creatinine urine ratio   Parathyroid  hormone, intact (no Ca)   CBC with Differential/Platelet   Uric acid   Vitamin B12   VITAMIN D  25 Hydroxy (Vit-D Deficiency, Fractures)    Patient Instructions  Labs today  Bring us  copy of advanced directive to update chart Try different antihistamine brand to see if it will help with am runny nose.  Let Dr Sam know if ongoing low sugars <70.  If interested, check with pharmacy about new 2 shot shingles series (shingrix).  Good to see you today  Return in 4-6 months for follow up visit   Follow up plan: Return in about 4 months (around 11/08/2023) for follow up visit.  Anton Blas, MD

## 2023-07-09 NOTE — Assessment & Plan Note (Signed)
 Preventative protocols reviewed and updated unless pt declined. Discussed healthy diet and lifestyle.  Discussed possibly stopping PSA screening next year.

## 2023-07-09 NOTE — Patient Instructions (Addendum)
 Labs today  Bring us  copy of advanced directive to update chart Try different antihistamine brand to see if it will help with am runny nose.  Let Dr Sam know if ongoing low sugars <70.  If interested, check with pharmacy about new 2 shot shingles series (shingrix).  Good to see you today  Return in 4-6 months for follow up visit

## 2023-07-10 ENCOUNTER — Other Ambulatory Visit: Payer: Self-pay | Admitting: Cardiovascular Disease

## 2023-07-10 ENCOUNTER — Encounter: Payer: Self-pay | Admitting: Family Medicine

## 2023-07-10 ENCOUNTER — Telehealth: Payer: Self-pay | Admitting: Family Medicine

## 2023-07-10 ENCOUNTER — Telehealth: Payer: Self-pay

## 2023-07-10 DIAGNOSIS — I4819 Other persistent atrial fibrillation: Secondary | ICD-10-CM

## 2023-07-10 LAB — COMPREHENSIVE METABOLIC PANEL WITH GFR
ALT: 8 U/L (ref 0–53)
AST: 9 U/L (ref 0–37)
Albumin: 3.8 g/dL (ref 3.5–5.2)
Alkaline Phosphatase: 93 U/L (ref 39–117)
BUN: 38 mg/dL — ABNORMAL HIGH (ref 6–23)
CO2: 32 meq/L (ref 19–32)
Calcium: 9 mg/dL (ref 8.4–10.5)
Chloride: 101 meq/L (ref 96–112)
Creatinine, Ser: 1.85 mg/dL — ABNORMAL HIGH (ref 0.40–1.50)
GFR: 35.18 mL/min — ABNORMAL LOW (ref >60.00)
Glucose, Bld: 231 mg/dL — ABNORMAL HIGH (ref 70–99)
Potassium: 4.5 meq/L (ref 3.5–5.1)
Sodium: 142 meq/L (ref 135–145)
Total Bilirubin: 1.3 mg/dL — ABNORMAL HIGH (ref 0.2–1.2)
Total Protein: 5.8 g/dL — ABNORMAL LOW (ref 6.0–8.3)

## 2023-07-10 LAB — CBC WITH DIFFERENTIAL/PLATELET
Basophils Absolute: 0.1 10*3/uL (ref 0.0–0.1)
Basophils Relative: 1.3 % (ref 0.0–3.0)
Eosinophils Absolute: 0.2 10*3/uL (ref 0.0–0.7)
Eosinophils Relative: 3.1 % (ref 0.0–5.0)
HCT: 35.9 % — ABNORMAL LOW (ref 39.0–52.0)
Hemoglobin: 11.9 g/dL — ABNORMAL LOW (ref 13.0–17.0)
Lymphocytes Relative: 17.8 % (ref 12.0–46.0)
Lymphs Abs: 1.3 10*3/uL (ref 0.7–4.0)
MCHC: 33 g/dL (ref 30.0–36.0)
MCV: 88.2 fl (ref 78.0–100.0)
Monocytes Absolute: 0.5 10*3/uL (ref 0.1–1.0)
Monocytes Relative: 6.8 % (ref 3.0–12.0)
Neutro Abs: 5 10*3/uL (ref 1.4–7.7)
Neutrophils Relative %: 71 % (ref 43.0–77.0)
Platelets: 162 10*3/uL (ref 150.0–400.0)
RBC: 4.07 Mil/uL — ABNORMAL LOW (ref 4.22–5.81)
RDW: 18.3 % — ABNORMAL HIGH (ref 11.5–15.5)
WBC: 7.1 10*3/uL (ref 4.0–10.5)

## 2023-07-10 LAB — TSH: TSH: 3.11 u[IU]/mL (ref 0.35–5.50)

## 2023-07-10 LAB — MICROALBUMIN / CREATININE URINE RATIO
Creatinine,U: 32.4 mg/dL
Microalb Creat Ratio: 110.3 mg/g — ABNORMAL HIGH (ref 0.0–30.0)
Microalb, Ur: 3.6 mg/dL — ABNORMAL HIGH (ref 0.0–1.9)

## 2023-07-10 LAB — PHOSPHORUS: Phosphorus: 3.8 mg/dL (ref 2.3–4.6)

## 2023-07-10 LAB — VITAMIN D 25 HYDROXY (VIT D DEFICIENCY, FRACTURES): VITD: 75.48 ng/mL (ref 30.00–100.00)

## 2023-07-10 LAB — LIPID PANEL
Cholesterol: 86 mg/dL (ref 0–200)
HDL: 27 mg/dL — ABNORMAL LOW (ref >39.00)
LDL Cholesterol: 33 mg/dL (ref 0–99)
NonHDL: 59.03
Total CHOL/HDL Ratio: 3
Triglycerides: 132 mg/dL (ref 0.0–149.0)
VLDL: 26.4 mg/dL (ref 0.0–40.0)

## 2023-07-10 LAB — VITAMIN B12: Vitamin B-12: 729 pg/mL (ref 211–911)

## 2023-07-10 LAB — PARATHYROID HORMONE, INTACT (NO CA): PTH: 63 pg/mL (ref 16–77)

## 2023-07-10 LAB — PSA: PSA: 0.73 ng/mL (ref 0.10–4.00)

## 2023-07-10 LAB — URIC ACID: Uric Acid, Serum: 5.7 mg/dL (ref 4.0–7.8)

## 2023-07-10 NOTE — Assessment & Plan Note (Signed)
 Update levels on monthly B12 shots done at home.  He states he just received latest B12 shot this past weekend.

## 2023-07-10 NOTE — Assessment & Plan Note (Signed)
 Chronic, stable. Continue current regimen.

## 2023-07-10 NOTE — Assessment & Plan Note (Addendum)
 Chronic, continues eliquis  5mg  bid, followed by cardiology/EP. Notes this is an expensive medication

## 2023-07-10 NOTE — Telephone Encounter (Signed)
 Pt requested AMW to be done yesterday along with physical but I forgot to provide him with medicare packet - can we mail medicare packet for him to fill out and return so we can scan it in? Thanks.

## 2023-07-10 NOTE — Assessment & Plan Note (Signed)
 Continues senna /docusate 2 pills daily

## 2023-07-10 NOTE — Assessment & Plan Note (Signed)
Chronic, stable on allopurinol without recent gout flare.

## 2023-07-10 NOTE — Assessment & Plan Note (Addendum)
 Chronic, followed by endo on high dose insulin  as well as ozempic  - appreciate Dr [facility] care.  Looks like ozempic  fell off med list - will update.  He notes some low sugars despite recent insulin  dose drop - encouraged notify endo if ongoing.

## 2023-07-10 NOTE — Assessment & Plan Note (Signed)
 Update levels on vitamin D  1000 units daily

## 2023-07-10 NOTE — Assessment & Plan Note (Signed)
 Chronic, continue lovastatin . Update FLP./ The ASCVD Risk score (Arnett DK, et al., 2019) failed to calculate for the following reasons:   Risk score cannot be calculated because patient has a medical history suggesting prior/existing ASCVD

## 2023-07-10 NOTE — Assessment & Plan Note (Signed)
 Sees Dr Bonner, has seen Dr Louis. Pending eval by [facility] in [place]

## 2023-07-10 NOTE — Assessment & Plan Note (Signed)
 Sees nephrology.  Update GFR.

## 2023-07-10 NOTE — Telephone Encounter (Signed)
 Prescription refill request for Eliquis  received. Indication: Afib  Last office visit:05/06/23 Anice)  Scr: 1.74 (03/03/23)  Age: 78 Weight: 108kg  Appropriate dose. Refill sent.

## 2023-07-10 NOTE — Assessment & Plan Note (Signed)
 Ongoing rhinitis worse in am and pm.  Suggested try different antihistamine brand.

## 2023-07-10 NOTE — Assessment & Plan Note (Signed)
 No aortic stenosis on latest echocardiogram 2025

## 2023-07-10 NOTE — Assessment & Plan Note (Signed)
 Continue ozempic , continue healthy lifestyle choices.

## 2023-07-10 NOTE — Telephone Encounter (Signed)
 Received Novolin  32 vial. Placed in fridge with patient name and information on it. Called patient left message with pickup information. Request call back if any questions.

## 2023-07-10 NOTE — Assessment & Plan Note (Signed)
 Continue statin.

## 2023-07-11 ENCOUNTER — Ambulatory Visit: Payer: Self-pay | Admitting: Family Medicine

## 2023-07-11 NOTE — Telephone Encounter (Signed)
Mailed packet to pt

## 2023-07-16 ENCOUNTER — Other Ambulatory Visit: Payer: Self-pay | Admitting: Internal Medicine

## 2023-07-17 DIAGNOSIS — M5417 Radiculopathy, lumbosacral region: Secondary | ICD-10-CM | POA: Diagnosis not present

## 2023-07-17 DIAGNOSIS — M5136 Other intervertebral disc degeneration, lumbar region with discogenic back pain only: Secondary | ICD-10-CM | POA: Diagnosis not present

## 2023-07-17 DIAGNOSIS — M961 Postlaminectomy syndrome, not elsewhere classified: Secondary | ICD-10-CM | POA: Diagnosis not present

## 2023-07-17 DIAGNOSIS — Z79899 Other long term (current) drug therapy: Secondary | ICD-10-CM | POA: Diagnosis not present

## 2023-07-17 DIAGNOSIS — Z5181 Encounter for therapeutic drug level monitoring: Secondary | ICD-10-CM | POA: Diagnosis not present

## 2023-07-29 ENCOUNTER — Encounter: Payer: Self-pay | Admitting: Medical

## 2023-07-29 ENCOUNTER — Ambulatory Visit: Attending: Medical | Admitting: Medical

## 2023-07-29 VITALS — BP 126/74 | HR 57 | Ht 65.0 in | Wt 237.8 lb

## 2023-07-29 DIAGNOSIS — I2510 Atherosclerotic heart disease of native coronary artery without angina pectoris: Secondary | ICD-10-CM

## 2023-07-29 DIAGNOSIS — I4819 Other persistent atrial fibrillation: Secondary | ICD-10-CM

## 2023-07-29 DIAGNOSIS — G4733 Obstructive sleep apnea (adult) (pediatric): Secondary | ICD-10-CM | POA: Diagnosis not present

## 2023-07-29 DIAGNOSIS — I5042 Chronic combined systolic (congestive) and diastolic (congestive) heart failure: Secondary | ICD-10-CM

## 2023-07-29 DIAGNOSIS — I10 Essential (primary) hypertension: Secondary | ICD-10-CM

## 2023-07-29 DIAGNOSIS — R001 Bradycardia, unspecified: Secondary | ICD-10-CM

## 2023-07-29 DIAGNOSIS — I4892 Unspecified atrial flutter: Secondary | ICD-10-CM | POA: Diagnosis not present

## 2023-07-29 NOTE — Patient Instructions (Addendum)
 Medication Instructions:  Your physician recommends that you continue on your current medications as directed. Please refer to the Current Medication list given to you today.    *If you need a refill on your cardiac medications before your next appointment, please call your pharmacy*  Lab Work: No labs ordered today    Testing/Procedures: No test ordered today   Follow-Up: At [facility], you and your health needs are our priority.  As part of our continuing mission to provide you with exceptional heart care, our providers are all part of one team.  This team includes your primary Cardiologist (physician) and Advanced Practice Providers or APPs (Physician Assistants and Nurse Practitioners) who all work together to provide you with the care you need, when you need it.  Your next appointment:   6 month(s)  Provider:   You may see Timothy Gollan, MD or one of the following Advanced Practice Providers on your designated Care Team:   Cadence [place], PA-C

## 2023-07-29 NOTE — Progress Notes (Signed)
 Cardiology Office Note   Date:  07/29/2023  ID:  Donald [name], DOB 1945/05/17, MRN [id number] PCP: Rilla Baller, MD   HeartCare Providers Cardiologist:  Evalene Lunger, MD Electrophysiologist:  Fonda Kitty, MD   History of Present Illness Donald [name] is a 78 y.o. male with a history of HFimpEF, persistent Afib/flutter, nonobstructive CAD, poorly controlled insulin -dependent diabetes, hyperlipidemia, PAD, DVT and extensive bilateral pulmonary edema 2009, CKD stage III, OSA on CPAP, asthmatic bronchitis, morbid obesity, gout, GERD, anemia, history of hematuria, status post parathyroidectomy on the right side in 2019, hypercalcemia, chronic back pain status post surgeries who is being seen for follow-up of Afib.  Echo in 2022 showed LVEF 45-50%, G3DD. LHC 10/2020 showed mild nonobstructive CAD, mild AS. Heart monitor for syncope in 11/2020 showed NSR, intermittent BBB, I run of SVT, second degree AV block.    The patient was to [facility] in late December 2024 with A-fib with slow ventricular response, sacral wound secondary to MRSA, chronic combined systolic and diastolic heart failure.  He was found to have slow Afib with HR as low as 37 while sleeping. He was noted to have pauses of 4.28 seconds on telemetry.  He is not on AV nodal blocking agents.  Due to CHA2DS2-VASc of 5 he was started on Eliquis .   He was seen 03/03/23 and was in aflutter with rates in the 70s and he was set up for cardioversion. He underwent DCCV 03/11/23 with conversion to NSR. He was seen back in follow-up 03/28/23 and was back in Afib with slow ventricular response. He was referred to EP, who recommended revisiting DCCV and or AA in 3 months.   Today, the patient is in rate controlled Afib/flutter. He says he underwent back injections but they did not help. He saw another doctor to discuss spinal chord stimulator, but was weary of committing to this. He said Eliquis  was not held for the injections.  Today, he remains in rate controlled Aflutter. He denies chest pain, SOB, dizziness, lightheadedness, weakness. He is on Eliquis  and denies missing doses. Discussed repeat DCCV, but he would like to wait.he reports compliance with CPAP.   Studies Reviewed EKG Interpretation Date/Time:  Tuesday July 29 2023 11:34:14 EDT Ventricular Rate:  57 PR Interval:    QRS Duration:  152 QT Interval:  484 QTC Calculation: 471 R Axis:   2  Text Interpretation: Atrial flutter with 4:1 A-V conduction Right bundle branch block Inferior infarct , age undetermined When compared with ECG of 28-Mar-2023 11:10, Atrial flutter has replaced Atrial fibrillation Inferior infarct is now Present Confirmed by Franchester, Alycia Cooperwood ([id number]) on 07/29/2023 11:39:08 AM    Heart monitor 03/2023 Event monitor Patch Wear Time:  13 days and 23 hours (2025-03-19T13:55:40-0400 to 2025-04-02T13:29:25-0400)   Atrial Flutter occurred continuously (100% burden), ranging from 28-126 bpm (avg of 52 bpm).    88 Pauses occurred, the longest lasting 5.1 secs (12 bpm).    1 run of Ventricular Tachycardia occurred lasting 4 beats with a max rate of 110 bpm (avg 101 bpm).    Isolated VEs were occasional (1.2%, 10813), VE Couplets were rare (<1.0%, 688), and no VE Triplets were present.  Ventricular Bigeminy and Trigeminy were present.    Difficulty discerning atrial activity making definitive diagnosis difficult to ascertain.   No patient triggered events recorded   Signed, Velinda Lunger, MD, Ph.D [facility]  Echo 04/2023 1. Left ventricular ejection fraction, by estimation, is 60 to 65%. The  left ventricle has normal  function. The left ventricle has no regional  wall motion abnormalities. There is mild left ventricular hypertrophy.  Left ventricular diastolic parameters  are indeterminate.   2. Right ventricular systolic function is normal. The right ventricular  size is normal. Tricuspid regurgitation signal is inadequate for  assessing  PA pressure.   3. Left atrial size was moderately dilated.   4. The mitral valve is normal in structure. Mild mitral valve  regurgitation. No evidence of mitral stenosis.   5. The aortic valve is normal in structure. There is mild calcification  of the aortic valve. Aortic valve regurgitation is not visualized. Aortic  valve sclerosis/calcification is present, without any evidence of aortic  stenosis.   6. The inferior vena cava is normal in size with greater than 50%  respiratory variability, suggesting right atrial pressure of 3 mmHg.    Physical Exam VS:  BP 126/74 (BP Location: Left Arm, Patient Position: Sitting)   Pulse (!) 57   Ht 5' 5 (1.651 m)   Wt 237 lb 12.8 oz (107.9 kg)   SpO2 98%   BMI 39.57 kg/m        Wt Readings from Last 3 Encounters:  07/29/23 237 lb 12.8 oz (107.9 kg)  07/09/23 238 lb (108 kg)  06/24/23 235 lb (106.6 kg)    GEN: Well nourished, well developed in no acute distress NECK: No JVD; No carotid bruits CARDIAC: Reg Irreg, + murmurs, rubs, gallops RESPIRATORY:  Clear to auscultation without rales, wheezing or rhonchi  ABDOMEN: Soft, non-tender, non-distended EXTREMITIES:  No edema; No deformity   ASSESSMENT AND PLAN  Persistent Afib/flutter Diagnosed in 12/2022 in the setting of acute illness. He underwent DCCV 02/2023 but was back in Afib/flutter at follow-up 03/28/23. Heart monitor showed Aflutter 100%, 28-126bpm, 88 pauses (longest lasting 5.1 seconds), 1 run of NSVT lasting 4 beats. He is not on any rate controlling medications. Patient saw EP who discussed repeat DCCV and/or AA, but held given possibility of holding a/c for back injections. Patient reports a/c was not held for back injections. No plans in in the near future for back injections or procedures. He is unsure of repeating a DCCV and would like to discuss AA options with EP. He has appointment with EP APP next week. Continue Eliquis  5mg  BID for stroke ppx.    Bradycardia Heart monitor showed bradycardia while in Afib/flutter. Significant bradycardia/pauses occurred during sleep and were not triggered events. Avoid rate controlled medications.   Chronic systolic and diastolic CHF Echo in 2022 showed LVEF 45-50%. The patient is euvolemic on exam. Recent echo showed LVEF 60-65%, no WMA, mild LVH. Continue lasix  80mg  daily.   CAD Cardiac cath in 2022 showed nonobstructive CAD. No ASA given Eliquis . Continue statin therapy.   OSA He reports compliance with CPAP.   HTN BP is normal, continue Imdur , Losartan , Cardura , and lasix .        Dispo: Follow-up in 6 months with general cardiology  Signed, Stacey Sago VEAR Fishman, PA-C

## 2023-08-04 NOTE — Progress Notes (Deleted)
 Electrophysiology Clinic Note    Date:  08/04/2023  Patient ID:  Donald [name] 1945-10-26, MRN [id number] PCP:  Rilla Baller, MD  Cardiologist:  Evalene Lunger, MD   Electrophysiologist:  Fonda Kitty, MD    ***refresh  Discussed the use of AI scribe software for clinical note transcription with the patient, who gave verbal consent to proceed.   Patient Profile    Chief Complaint: ***  History of Present Illness: Donald [name] is a 78 y.o. male with PMH notable for persis AFib, aflutter, bradycardia, HFimpEF, OSA on CPAP, HTN, T2DM (poorly controlled), CKD-3, sacral wound; seen today for Fonda Kitty, MD for routine electrophysiology followup.   He last saw Dr. Kitty 04/2023 for initial EP evaluation of AFib, flutter. His AFib was first diagnosed 12/2022 when he was in hospital for sacral wound 2/2 MRSA. He had afib w slow ventricular response. He is s/p DCCV 02/2023 without any change in fatigue. He believed his symptoms were mostly d/t deconditioning and chronic back pain. During appt with Dr. Kitty, patient stated he would be having spinal injections soon and so eliquis  would need to be paused for this. They elected to not pursue rhythm control strategy at that time given his planned anticoagulation interruption.   He saw PA Franchester 7/15. He had spinal injections that did not help his functional status and his provider recommended spinal cord stimulator. Eliquis  had not been held for the injections.    On follow-up today, *** AF burden, symptoms *** palpitations *** bleeding concerns  - meds-- multaq, ?tikosyn  Since last being seen in our clinic the patient reports doing ***.  he denies chest pain, palpitations, dyspnea, PND, orthopnea, nausea, vomiting, dizziness, syncope, edema, weight gain, or early satiety.      Arrhythmia/Device History No specialty comments available.    ROS:  Please see the history of present illness. All other systems are  reviewed and otherwise negative.    Physical Exam    VS:  There were no vitals taken for this visit. BMI: There is no height or weight on file to calculate BMI.      Wt Readings from Last 3 Encounters:  07/29/23 237 lb 12.8 oz (107.9 kg)  07/09/23 238 lb (108 kg)  06/24/23 235 lb (106.6 kg)     GEN- The patient is well appearing, alert and oriented x 3 today.   Lungs- Clear to ausculation bilaterally, normal work of breathing.  Heart- {Blank single:19197::Regular,Irregularly irregular} rate and rhythm, no murmurs, rubs or gallops Extremities- {EDEMA LEVEL:28147::No} peripheral edema, warm, dry   Studies Reviewed   Previous EP, cardiology notes.    EKG is not ordered. Personal review of EKG from 07/29/2023 shows:  Aflutter, rate 57bpm; RBBB         TTE, 04/29/2023 1. Left ventricular ejection fraction, by estimation, is 60 to 65%. The left ventricle has normal function. The left ventricle has no regional wall motion abnormalities. There is mild left ventricular hypertrophy. Left ventricular diastolic parameters are indeterminate.   2. Right ventricular systolic function is normal. The right ventricular size is normal. Tricuspid regurgitation signal is inadequate for assessing PA pressure.   3. Left atrial size was moderately dilated.   4. The mitral valve is normal in structure. Mild mitral valve regurgitation. No evidence of mitral stenosis.   5. The aortic valve is normal in structure. There is mild calcification of the aortic valve. Aortic valve regurgitation is not visualized. Aortic valve sclerosis/calcification is  present, without any evidence of aortic stenosis.   6. The inferior vena cava is normal in size with greater than 50% respiratory variability, suggesting right atrial pressure of 3 mmHg.   Long term monitor, 04/28/2023 Atrial Flutter occurred continuously (100% burden), ranging from 28-126 bpm (avg of 52 bpm).   88 Pauses occurred, the longest lasting 5.1  secs (12 bpm).   1 run of Ventricular Tachycardia occurred lasting 4 beats with a max rate of 110 bpm (avg 101 bpm).   Isolated VEs were occasional (1.2%, 10813), VE Couplets were rare (<1.0%, 688), and no VE Triplets were present.  Ventricular Bigeminy and Trigeminy were present.   Difficulty discerning atrial activity making definitive diagnosis difficult to ascertain.  No patient triggered events recorded   Assessment and Plan     #) AFib #) aflutter   #) Hypercoag d/t afib CHA2DS2-VASc Score = at least 5 [CHF History: 1, HTN History: 1, Diabetes History: 1, Stroke History: 0, Vascular Disease History: 0, Age Score: 2, Gender Score: 0].  Therefore, the patient's annual risk of stroke is 7.2 %.     {Confirm score is correct.  If not, click here to update score.  REFRESH note.  :1}   Stroke ppx - 5mg  eliquis  BID, appropriately dosed No bleeding concerns    #) ***   {Are you ordering a CV Procedure (e.g. stress test, cath, DCCV, TEE, etc)?   Press F2        :[id number]}   Current medicines are reviewed at length with the patient today.   The patient {ACTIONS; HAS/DOES NOT HAVE:19233} concerns regarding his medicines.  The following changes were made today:  {NONE DEFAULTED:18576}  Labs/ tests ordered today include: *** No orders of the defined types were placed in this encounter.    Disposition: Follow up with {EPMDS:28135::EP Team} or EP APP {EPFOLLOW UP:28173}   Signed, Chantal Needle, NP  08/04/23  3:51 PM  Electrophysiology [facility]

## 2023-08-05 ENCOUNTER — Ambulatory Visit: Admitting: Cardiology

## 2023-08-05 ENCOUNTER — Telehealth: Payer: Self-pay | Admitting: Family Medicine

## 2023-08-05 DIAGNOSIS — G4733 Obstructive sleep apnea (adult) (pediatric): Secondary | ICD-10-CM

## 2023-08-05 DIAGNOSIS — I480 Paroxysmal atrial fibrillation: Secondary | ICD-10-CM

## 2023-08-05 NOTE — Telephone Encounter (Signed)
 Copied from CRM #[id number]. Topic: Clinical - Medication Prior Auth >> Aug 05, 2023  2:23 PM Jayma L wrote: Reason for CRM: jon with [employer] called stated he's sending a order for a cpap machine , provided fax number

## 2023-08-05 NOTE — Telephone Encounter (Signed)
 Printed off the fax and placed in box for review.

## 2023-08-05 NOTE — Telephone Encounter (Addendum)
 He will need to see pulm for this. Last seen 09/2021 by Dr Shellia. New referral placed to [facility] [place] sleep doctor.  He may contact [facility] in [place] to schedule an appointment: [phone]  Form placed back in my out box.

## 2023-08-05 NOTE — Addendum Note (Signed)
 Addended by: RILLA BALLER on: 08/05/2023 05:32 PM   Modules accepted: Orders

## 2023-08-06 ENCOUNTER — Telehealth: Payer: Self-pay | Admitting: Sleep Medicine

## 2023-08-06 NOTE — Telephone Encounter (Signed)
 LVMTCB to schedule sleep consult.

## 2023-08-08 NOTE — Telephone Encounter (Signed)
 Called number of order left message on secured line. Faxed form back to number provided with note to send to pulmonology.

## 2023-08-09 DIAGNOSIS — G894 Chronic pain syndrome: Secondary | ICD-10-CM | POA: Diagnosis not present

## 2023-08-09 DIAGNOSIS — M5412 Radiculopathy, cervical region: Secondary | ICD-10-CM | POA: Diagnosis not present

## 2023-08-09 DIAGNOSIS — M549 Dorsalgia, unspecified: Secondary | ICD-10-CM | POA: Diagnosis not present

## 2023-08-09 DIAGNOSIS — M5416 Radiculopathy, lumbar region: Secondary | ICD-10-CM | POA: Diagnosis not present

## 2023-08-13 NOTE — Telephone Encounter (Signed)
 LVMTCB to schedule sleep consult.

## 2023-08-19 ENCOUNTER — Telehealth: Payer: Self-pay

## 2023-08-19 ENCOUNTER — Telehealth: Payer: Self-pay | Admitting: Family Medicine

## 2023-08-19 MED ORDER — INSULIN NPH (HUMAN) (ISOPHANE) 100 UNIT/ML ~~LOC~~ SUSP: 100.0000 [IU] | Freq: Every day | SUBCUTANEOUS | 3 refills | Status: AC

## 2023-08-19 NOTE — Telephone Encounter (Signed)
 Pt wife, [name], came by office, she stated how the pt stated he recently received a text message, from what he believed to be from our office, advising him to stop by office to pick up meds. Checked refrigerator & cabinet up front for meds for pt, none were located. Let [name] know our office usually will call or send pt messages through mychart for any med pick ups, not send text messages. Please advise. Call back # [phone]

## 2023-08-19 NOTE — Telephone Encounter (Signed)
 Per fax from [facility] pharmacy Humulin  N preferred or prior authorization for Novolin  N.

## 2023-08-19 NOTE — Telephone Encounter (Signed)
 Copied from CRM #[id number]. Topic: Clinical - Order For Equipment >> Aug 18, 2023  3:36 PM Thersia C wrote: Reason for CRM: terrik from [employer] called in stated needs a valid prescription for his cpap machine , stated he will be faxing it the form today for prescription   We need to call [employer] to let know needs to be sent to pulmonology

## 2023-08-19 NOTE — Telephone Encounter (Signed)
 Spoke to patient he will reach out and let them know that they need to be sending to pulmonology.

## 2023-08-19 NOTE — Telephone Encounter (Signed)
 Called patient let him know it was his novolin  that was received 07/09/23. He will come by and pick up.

## 2023-08-26 NOTE — Telephone Encounter (Signed)
 Pt's wife came in and picked up his medications.

## 2023-09-11 ENCOUNTER — Ambulatory Visit: Admitting: Cardiology

## 2023-09-18 ENCOUNTER — Telehealth: Payer: Self-pay | Admitting: Family Medicine

## 2023-09-18 DIAGNOSIS — Z1211 Encounter for screening for malignant neoplasm of colon: Secondary | ICD-10-CM

## 2023-09-18 NOTE — Telephone Encounter (Signed)
 Called patient reviewed all information and repeated back to me. Will call if any questions.  ? ?

## 2023-09-18 NOTE — Telephone Encounter (Signed)
 Almost time for repeat cologuard - plz notify I have ordered this.  Rec he complete after 10/05/2023.

## 2023-09-23 DIAGNOSIS — M5416 Radiculopathy, lumbar region: Secondary | ICD-10-CM | POA: Diagnosis not present

## 2023-09-24 DIAGNOSIS — G8929 Other chronic pain: Secondary | ICD-10-CM | POA: Diagnosis not present

## 2023-09-24 DIAGNOSIS — M545 Low back pain, unspecified: Secondary | ICD-10-CM | POA: Diagnosis not present

## 2023-10-01 ENCOUNTER — Other Ambulatory Visit: Payer: Self-pay | Admitting: Cardiovascular Disease

## 2023-10-01 DIAGNOSIS — M545 Low back pain, unspecified: Secondary | ICD-10-CM | POA: Diagnosis not present

## 2023-10-01 DIAGNOSIS — G8929 Other chronic pain: Secondary | ICD-10-CM | POA: Diagnosis not present

## 2023-10-01 NOTE — Progress Notes (Unsigned)
 Electrophysiology Clinic Note    Date:  10/02/2023  Patient ID:  Donald [name], Donald [name] 1945/09/15, MRN [id number] PCP:  Rilla Baller, MD  Cardiologist:  Evalene Lunger, MD   Electrophysiologist:  Fonda Kitty, MD     Discussed the use of AI scribe software for clinical note transcription with the patient, who gave verbal consent to proceed.   Patient Profile    Chief Complaint: AFib, aflutter follow-up  History of Present Illness: Donald [name] is a 78 y.o. male with PMH notable for persis AFib, aflutter, bradycardia, HFimpEF, OSA on CPAP, HTN, T2DM (poorly controlled), CKD-3, sacral wound; seen today for Fonda Kitty, MD for routine electrophysiology followup.   He last saw Dr. Kitty 04/2023 for initial EP evaluation of AFib, flutter. His AFib was first diagnosed 12/2022 when he was in hospital for sacral wound 2/2 MRSA. He had afib w slow ventricular response. He is s/p DCCV 02/2023 without any change in fatigue. He believed his symptoms were mostly d/t deconditioning and chronic back pain. During appt with Dr. Kitty, patient stated he would be having spinal injections soon and so eliquis  would need to be paused for this. They elected to not pursue rhythm control strategy at that time given his planned anticoagulation interruption.   He saw PA Franchester 7/15. He had spinal injections that did not help his functional status and his provider recommended spinal cord stimulator. Eliquis  had not been held for the injections.   On follow-up today, his main complaint is ongoing low back pain. He recently started PT. His sacral wound is completely healed. He denies chest pain, chest pressure, palpitations. No increased lower extremity swelling.  He continues to take eliquis  BID, no bleeding concerns.   He is helping to care for wife who now has to see draining wound on leg and sees wound clinic regularly.    Arrhythmia/Device History No specialty comments  available.    ROS:  Please see the history of present illness. All other systems are reviewed and otherwise negative.    Physical Exam    VS:  BP (!) 161/74 (BP Location: Left Arm, Patient Position: Sitting, Cuff Size: Normal) Comment: machine -I couldn't hear it - pt hasn't taken meds  Pulse (!) 56   Ht 5' 5 (1.651 m)   Wt 230 lb (104.3 kg)   SpO2 95%   BMI 38.27 kg/m  BMI: Body mass index is 38.27 kg/m.      Wt Readings from Last 3 Encounters:  10/02/23 230 lb (104.3 kg)  07/29/23 237 lb 12.8 oz (107.9 kg)  07/09/23 238 lb (108 kg)     GEN- The patient is well appearing, alert and oriented x 3 today.   Lungs- Clear to ausculation bilaterally, normal work of breathing.  Heart- Regular rate and rhythm, no murmurs, rubs or gallops Extremities- Trace peripheral edema, warm, dry   Studies Reviewed   Previous EP, cardiology notes.    EKG is ordered. Personal review of EKG from today shows:    EKG Interpretation Date/Time:  Thursday October 02 2023 11:25:06 EDT Ventricular Rate:  56 PR Interval:    QRS Duration:  146 QT Interval:  478 QTC Calculation: 461 R Axis:   11  Text Interpretation: Atrial flutter with 4:1 A-V conduction Right bundle branch block Confirmed by Vasil Juhasz [phone] on 10/02/2023 1:04:13 PM    TTE, 04/29/2023 1. Left ventricular ejection fraction, by estimation, is 60 to 65%. The left ventricle has normal function. The  left ventricle has no regional wall motion abnormalities. There is mild left ventricular hypertrophy. Left ventricular diastolic parameters are indeterminate.   2. Right ventricular systolic function is normal. The right ventricular size is normal. Tricuspid regurgitation signal is inadequate for assessing PA pressure.   3. Left atrial size was moderately dilated.   4. The mitral valve is normal in structure. Mild mitral valve regurgitation. No evidence of mitral stenosis.   5. The aortic valve is normal in structure. There is  mild calcification of the aortic valve. Aortic valve regurgitation is not visualized. Aortic valve sclerosis/calcification is present, without any evidence of aortic stenosis.   6. The inferior vena cava is normal in size with greater than 50% respiratory variability, suggesting right atrial pressure of 3 mmHg.   Long term monitor, 04/28/2023 Atrial Flutter occurred continuously (100% burden), ranging from 28-126 bpm (avg of 52 bpm).   88 Pauses occurred, the longest lasting 5.1 secs (12 bpm).   1 run of Ventricular Tachycardia occurred lasting 4 beats with a max rate of 110 bpm (avg 101 bpm).   Isolated VEs were occasional (1.2%, 10813), VE Couplets were rare (<1.0%, 688), and no VE Triplets were present.  Ventricular Bigeminy and Trigeminy were present.   Difficulty discerning atrial activity making definitive diagnosis difficult to ascertain.  No patient triggered events recorded  Assessment and Plan   #) persis AFib #) aflutter #) bradycardia Continues to be in asymptomatic atrial flutter. We discussed rhythm vs rate control strategy, and patient prefers rhythm control if possible. We discussed AAD options, specifically multaq  vs tikosyn. Patient is not interested in admission for tikosyn loading.  No recent HF exacerbations or admissions. Will start 400mg  multaq  BID, with DCCV in ~3 weeks.  Reviewed risks/benefits of procedure, patient wishes to proceed.  Pre-procedure BMP, CBC today   #) Hypercoag d/t long standing persis afib CHA2DS2-VASc Score = at least 5 [CHF History: 1, HTN History: 1, Diabetes History: 1, Stroke History: 0, Vascular Disease History: 0, Age Score: 2, Gender Score: 0].  Therefore, the patient's annual risk of stroke is 7.2 %.    Stroke ppx - 5mg  eliquis  BID, appropriately dosed No bleeding concerns   #) HTN He has not taken his medications today Continue to monitor at home, checking 2-3 times a week and recording measurements Continue 1mg  cardura  BID, 80mg   lasix  daily, 25mg  losartan     Informed Consent   Shared Decision Making/Informed Consent The risks (stroke, cardiac arrhythmias rarely resulting in the need for a temporary or permanent pacemaker, skin irritation or burns and complications associated with conscious sedation including aspiration, arrhythmia, respiratory failure and death), benefits (restoration of normal sinus rhythm) and alternatives of a direct current cardioversion were explained in detail to Donald [name] and he agrees to proceed.           Current medicines are reviewed at length with the patient today.   The patient does not have concerns regarding his medicines.  The following changes were made today:   START 400mg  multaq  BID  Labs/ tests ordered today include:  Orders Placed This Encounter  Procedures   CBC   Basic metabolic panel with GFR   EKG 87-Ozji     Disposition: Follow up with Dr. Kennyth or EP APP as usual post procedure   Signed, Chantal Needle, NP  10/02/23  1:06 PM  Electrophysiology [facility]

## 2023-10-01 NOTE — H&P (View-Only) (Signed)
 Electrophysiology Clinic Note    Date:  10/02/2023  Patient ID:  Donald [name] 1945/09/15, MRN [id number] PCP:  Rilla Baller, MD  Cardiologist:  Evalene Lunger, MD   Electrophysiologist:  Fonda Kitty, MD     Discussed the use of AI scribe software for clinical note transcription with the patient, who gave verbal consent to proceed.   Patient Profile    Chief Complaint: AFib, aflutter follow-up  History of Present Illness: Donald [name] is a 78 y.o. male with PMH notable for persis AFib, aflutter, bradycardia, HFimpEF, OSA on CPAP, HTN, T2DM (poorly controlled), CKD-3, sacral wound; seen today for Fonda Kitty, MD for routine electrophysiology followup.   He last saw Dr. Kitty 04/2023 for initial EP evaluation of AFib, flutter. His AFib was first diagnosed 12/2022 when he was in hospital for sacral wound 2/2 MRSA. He had afib w slow ventricular response. He is s/p DCCV 02/2023 without any change in fatigue. He believed his symptoms were mostly d/t deconditioning and chronic back pain. During appt with Dr. Kitty, patient stated he would be having spinal injections soon and so eliquis  would need to be paused for this. They elected to not pursue rhythm control strategy at that time given his planned anticoagulation interruption.   He saw PA Franchester 7/15. He had spinal injections that did not help his functional status and his provider recommended spinal cord stimulator. Eliquis  had not been held for the injections.   On follow-up today, his main complaint is ongoing low back pain. He recently started PT. His sacral wound is completely healed. He denies chest pain, chest pressure, palpitations. No increased lower extremity swelling.  He continues to take eliquis  BID, no bleeding concerns.   He is helping to care for wife who now has to see draining wound on leg and sees wound clinic regularly.    Arrhythmia/Device History No specialty comments  available.    ROS:  Please see the history of present illness. All other systems are reviewed and otherwise negative.    Physical Exam    VS:  BP (!) 161/74 (BP Location: Left Arm, Patient Position: Sitting, Cuff Size: Normal) Comment: machine -I couldn't hear it - pt hasn't taken meds  Pulse (!) 56   Ht 5' 5 (1.651 m)   Wt 230 lb (104.3 kg)   SpO2 95%   BMI 38.27 kg/m  BMI: Body mass index is 38.27 kg/m.      Wt Readings from Last 3 Encounters:  10/02/23 230 lb (104.3 kg)  07/29/23 237 lb 12.8 oz (107.9 kg)  07/09/23 238 lb (108 kg)     GEN- The patient is well appearing, alert and oriented x 3 today.   Lungs- Clear to ausculation bilaterally, normal work of breathing.  Heart- Regular rate and rhythm, no murmurs, rubs or gallops Extremities- Trace peripheral edema, warm, dry   Studies Reviewed   Previous EP, cardiology notes.    EKG is ordered. Personal review of EKG from today shows:    EKG Interpretation Date/Time:  Thursday October 02 2023 11:25:06 EDT Ventricular Rate:  56 PR Interval:    QRS Duration:  146 QT Interval:  478 QTC Calculation: 461 R Axis:   11  Text Interpretation: Atrial flutter with 4:1 A-V conduction Right bundle branch block Confirmed by Vasil Juhasz [phone]) on 10/02/2023 1:04:13 PM    TTE, 04/29/2023 1. Left ventricular ejection fraction, by estimation, is 60 to 65%. The left ventricle has normal function. The  left ventricle has no regional wall motion abnormalities. There is mild left ventricular hypertrophy. Left ventricular diastolic parameters are indeterminate.   2. Right ventricular systolic function is normal. The right ventricular size is normal. Tricuspid regurgitation signal is inadequate for assessing PA pressure.   3. Left atrial size was moderately dilated.   4. The mitral valve is normal in structure. Mild mitral valve regurgitation. No evidence of mitral stenosis.   5. The aortic valve is normal in structure. There is  mild calcification of the aortic valve. Aortic valve regurgitation is not visualized. Aortic valve sclerosis/calcification is present, without any evidence of aortic stenosis.   6. The inferior vena cava is normal in size with greater than 50% respiratory variability, suggesting right atrial pressure of 3 mmHg.   Long term monitor, 04/28/2023 Atrial Flutter occurred continuously (100% burden), ranging from 28-126 bpm (avg of 52 bpm).   88 Pauses occurred, the longest lasting 5.1 secs (12 bpm).   1 run of Ventricular Tachycardia occurred lasting 4 beats with a max rate of 110 bpm (avg 101 bpm).   Isolated VEs were occasional (1.2%, 10813), VE Couplets were rare (<1.0%, 688), and no VE Triplets were present.  Ventricular Bigeminy and Trigeminy were present.   Difficulty discerning atrial activity making definitive diagnosis difficult to ascertain.  No patient triggered events recorded  Assessment and Plan   #) persis AFib #) aflutter #) bradycardia Continues to be in asymptomatic atrial flutter. We discussed rhythm vs rate control strategy, and patient prefers rhythm control if possible. We discussed AAD options, specifically multaq  vs tikosyn. Patient is not interested in admission for tikosyn loading.  No recent HF exacerbations or admissions. Will start 400mg  multaq  BID, with DCCV in ~3 weeks.  Reviewed risks/benefits of procedure, patient wishes to proceed.  Pre-procedure BMP, CBC today   #) Hypercoag d/t long standing persis afib CHA2DS2-VASc Score = at least 5 [CHF History: 1, HTN History: 1, Diabetes History: 1, Stroke History: 0, Vascular Disease History: 0, Age Score: 2, Gender Score: 0].  Therefore, the patient's annual risk of stroke is 7.2 %.    Stroke ppx - 5mg  eliquis  BID, appropriately dosed No bleeding concerns   #) HTN He has not taken his medications today Continue to monitor at home, checking 2-3 times a week and recording measurements Continue 1mg  cardura  BID, 80mg   lasix  daily, 25mg  losartan     Informed Consent   Shared Decision Making/Informed Consent The risks (stroke, cardiac arrhythmias rarely resulting in the need for a temporary or permanent pacemaker, skin irritation or burns and complications associated with conscious sedation including aspiration, arrhythmia, respiratory failure and death), benefits (restoration of normal sinus rhythm) and alternatives of a direct current cardioversion were explained in detail to Donald [name] and he agrees to proceed.           Current medicines are reviewed at length with the patient today.   The patient does not have concerns regarding his medicines.  The following changes were made today:   START 400mg  multaq  BID  Labs/ tests ordered today include:  Orders Placed This Encounter  Procedures   CBC   Basic metabolic panel with GFR   EKG 87-Ozji     Disposition: Follow up with Dr. Kennyth or EP APP as usual post procedure   Signed, Chantal Needle, NP  10/02/23  1:06 PM  Electrophysiology [facility]

## 2023-10-02 ENCOUNTER — Telehealth: Payer: Self-pay | Admitting: Cardiology

## 2023-10-02 ENCOUNTER — Other Ambulatory Visit (HOSPITAL_COMMUNITY): Payer: Self-pay

## 2023-10-02 ENCOUNTER — Ambulatory Visit: Attending: Cardiology | Admitting: Cardiology

## 2023-10-02 ENCOUNTER — Telehealth (HOSPITAL_COMMUNITY): Payer: Self-pay | Admitting: Pharmacy Technician

## 2023-10-02 ENCOUNTER — Other Ambulatory Visit: Payer: Self-pay

## 2023-10-02 VITALS — BP 161/74 | HR 56 | Ht 65.0 in | Wt 230.0 lb

## 2023-10-02 DIAGNOSIS — D6869 Other thrombophilia: Secondary | ICD-10-CM | POA: Diagnosis not present

## 2023-10-02 DIAGNOSIS — R001 Bradycardia, unspecified: Secondary | ICD-10-CM | POA: Diagnosis not present

## 2023-10-02 DIAGNOSIS — Z79899 Other long term (current) drug therapy: Secondary | ICD-10-CM

## 2023-10-02 DIAGNOSIS — I4819 Other persistent atrial fibrillation: Secondary | ICD-10-CM

## 2023-10-02 DIAGNOSIS — I4892 Unspecified atrial flutter: Secondary | ICD-10-CM

## 2023-10-02 MED ORDER — MULTAQ 400 MG PO TABS: 400.0000 mg | ORAL_TABLET | Freq: Two times a day (BID) | ORAL | 11 refills | Status: DC

## 2023-10-02 MED ORDER — MULTAQ 400 MG PO TABS
400.0000 mg | ORAL_TABLET | Freq: Two times a day (BID) | ORAL | 3 refills | Status: AC
  Filled 2023-10-02: qty 180, 90d supply, fill #0

## 2023-10-02 NOTE — Telephone Encounter (Signed)
 Pt's medication was sent to pt's pharmacy as requested. Confirmation received.

## 2023-10-02 NOTE — Telephone Encounter (Signed)
 Pharmacy Patient Advocate Encounter  Insurance verification completed.    The patient is insured through [facility].     Ran test claim for Multaq  400mg  tablets and the current 30 day co-pay is $0.00.   This test claim was processed through [facility]- copay amounts may vary at other pharmacies due to pharmacy/plan contracts, or as the patient moves through the different stages of their insurance plan.

## 2023-10-02 NOTE — Telephone Encounter (Signed)
 Patient called to report he will not need this refill request as he will be getting his dronedarone  (MULTAQ ) 400 MG tablet medication through [facility] and they will be delivering it tomorrow (9/19).

## 2023-10-02 NOTE — Telephone Encounter (Signed)
*  STAT* If patient is at the pharmacy, call can be transferred to refill team.   1. Which medications need to be refilled? (please list name of each medication and dose if known) dronedarone  (MULTAQ ) 400 MG tablet   DIFFERENT PHARMACY    4. Which pharmacy/location (including street and city if local pharmacy) is medication to be sent to?  [facility] Phone: [phone]  Fax: [phone]       5. Do they need a 30 day or 90 day supply? 90

## 2023-10-02 NOTE — Patient Instructions (Signed)
 Medication Instructions:  Your physician recommends the following medication changes.  START TAKING: Multaq  (dronedarone ) 400 mg twice daily  *If you need a refill on your cardiac medications before your next appointment, please call your pharmacy*  Lab Work: Your provider would like for you to have following labs drawn today CBC and BMeT.   If you have labs (blood work) drawn today and your tests are completely normal, you will receive your results only by: MyChart Message (if you have MyChart) OR A paper copy in the mail If you have any lab test that is abnormal or we need to change your treatment, we will call you to review the results.  Testing/Procedures:   Dear Donald [name]  You are scheduled for a Cardioversion on Thursday, October 8 with Dr. Velinda Lunger.  Please arrive at the [facility] Entrance of [facility], [address], [place] [address] at 6:30 AM (This is 1 hour(s) prior to your procedure time).  Proceed to the Check-In Desk directly inside the entrance.  Procedure Parking: Use the entrance off of the [address] side of the hospital. Turn right upon entering and follow the driveway to parking that is directly in front of the [facility]. There is no valet parking available at this entrance, however there is an awning directly in front of the [facility]ascular Center for drop off/ pick up for patients.   DIET:  Nothing to eat or drink after midnight except a sip of water with medications (see medication instructions below)  MEDICATION INSTRUCTIONS: !!IF ANY NEW MEDICATIONS ARE STARTED AFTER TODAY, PLEASE NOTIFY YOUR PROVIDER AS SOON AS POSSIBLE!!  FYI: Medications such as Semaglutide  (Ozempic , Wegovy ), Tirzepatide  (Mounjaro , Zepbound ), Dulaglutide (Trulicity), etc (GLP1 agonists) AND Canagliflozin (Invokana), Dapagliflozin (Farxiga), Empagliflozin (Jardiance), Ertugliflozin (Steglatro), Bexagliflozin [employer]) or any combination with one  of these drugs such as Invokamet (Canagliflozin/Metformin), Synjardy (Empagliflozin/Metformin), etc (SGLT2 inhibitors) must be held around the time of a procedure. This is not a comprehensive list of all of these drugs. Please review all of your medications and talk to your provider if you take any one of these. If you are not sure, ask your provider.   HOLD: Semaglutide  (Ozempic , Rybelsus , Wegovy ) for 7 days prior to the procedure. Last dose on Wednesday, October 15, 2023  Take only your Eliquis  on the morning of your cardioversion  Continue taking your anticoagulant (blood thinner): Apixaban  (Eliquis ).  You will need to continue this after your procedure until you are told by your provider that it is safe to stop.    LABS: Will be collected today.  FYI:  For your safety, and to allow us  to monitor your vital signs accurately during the surgery/procedure we request: If you have artificial nails, gel coating, SNS etc, please have those removed prior to your surgery/procedure. Not having the nail coverings /polish removed may result in cancellation or delay of your surgery/procedure.  You must have a responsible person to drive you home and stay in the waiting area during your procedure. Failure to do so could result in cancellation.  Bring your insurance cards.  *Special Note: Every effort is made to have your procedure done on time. Occasionally there are emergencies that occur at the hospital that may cause delays. Please be patient if a delay does occur.   Follow-Up: At [facility], you and your health needs are our priority.  As part of our continuing mission to provide you with exceptional heart care,  our providers are all part of one team.  This team includes your primary Cardiologist (physician) and Advanced Practice Providers or APPs (Physician Assistants and Nurse Practitioners) who all work together to provide you with the care you need, when you need it.  Your next  appointment:   October 22 approximately  Provider:   Suzann Riddle, NP    We recommend signing up for the patient portal called MyChart.  Sign up information is provided on this After Visit Summary.  MyChart is used to connect with patients for Virtual Visits (Telemedicine).  Patients are able to view lab/test results, encounter notes, upcoming appointments, etc.  Non-urgent messages can be sent to your provider as well.   To learn more about what you can do with MyChart, go to [email or web address].   Other Instructions Multaq  is $0 at [facility]

## 2023-10-03 ENCOUNTER — Ambulatory Visit: Payer: Self-pay | Admitting: Cardiology

## 2023-10-03 LAB — BASIC METABOLIC PANEL WITH GFR
BUN/Creatinine Ratio: 24 (ref 10–24)
BUN: 42 mg/dL — ABNORMAL HIGH (ref 8–27)
CO2: 25 mmol/L (ref 20–29)
Calcium: 9.8 mg/dL (ref 8.6–10.2)
Chloride: 98 mmol/L (ref 96–106)
Creatinine, Ser: 1.74 mg/dL — ABNORMAL HIGH (ref 0.76–1.27)
Glucose: 193 mg/dL — ABNORMAL HIGH (ref 70–99)
Potassium: 5.2 mmol/L (ref 3.5–5.2)
Sodium: 143 mmol/L (ref 134–144)
eGFR: 40 mL/min/1.73 — ABNORMAL LOW (ref >59)

## 2023-10-03 LAB — CBC
Hematocrit: 41.4 % (ref 37.5–51.0)
Hemoglobin: 13.7 g/dL (ref 13.0–17.7)
MCH: 29.8 pg (ref 26.6–33.0)
MCHC: 33.1 g/dL (ref 31.5–35.7)
MCV: 90 fL (ref 79–97)
Platelets: 187 x10E3/uL (ref 150–450)
RBC: 4.6 x10E6/uL (ref 4.14–5.80)
RDW: 14.1 % (ref 11.6–15.4)
WBC: 7.2 x10E3/uL (ref 3.4–10.8)

## 2023-10-10 ENCOUNTER — Telehealth: Payer: Self-pay | Admitting: Cardiology

## 2023-10-10 NOTE — Telephone Encounter (Signed)
 Called patient to review symptoms - patient states he has has nausea, headaches and dizziness since he started the Multaq . Advised patient to hold the medication until he hears back from this office. Please advise treatment change going forward

## 2023-10-10 NOTE — Telephone Encounter (Signed)
 Called patient  He has been taking multac with meals Current BP and HR : 127/35   53;  second take : 156/52  53  Patient denies dizziness at this time - he will continue the Multaq  over the weekend and record BP/HR and contact the office on Monday

## 2023-10-10 NOTE — Telephone Encounter (Signed)
 Pt c/o medication issue:  1. Name of Medication:   dronedarone  (MULTAQ ) 400 MG tablet    2. How are you currently taking this medication (dosage and times per day)?    3. Are you having a reaction (difficulty breathing--STAT)? no  4. What is your medication issue? Medication is causing him to be sick and dizzy. Calling to see if he really needs to take this medication. Please advise

## 2023-10-11 ENCOUNTER — Other Ambulatory Visit: Payer: Self-pay | Admitting: Cardiovascular Disease

## 2023-10-20 ENCOUNTER — Encounter: Payer: Self-pay | Admitting: Pharmacist

## 2023-10-22 ENCOUNTER — Telehealth: Payer: Self-pay | Admitting: Cardiology

## 2023-10-22 ENCOUNTER — Ambulatory Visit
Admission: RE | Admit: 2023-10-22 | Discharge: 2023-10-22 | Disposition: A | Discharge status: Home / Self Care | Attending: Cardiovascular Disease | Admitting: Cardiovascular Disease

## 2023-10-22 ENCOUNTER — Other Ambulatory Visit: Payer: Self-pay

## 2023-10-22 ENCOUNTER — Encounter
Admission: RE | Disposition: A | Discharge status: Home / Self Care | Payer: Self-pay | Source: Home / Self Care | Attending: Cardiovascular Disease

## 2023-10-22 ENCOUNTER — Ambulatory Visit: Admitting: Anesthesiology

## 2023-10-22 ENCOUNTER — Encounter: Payer: Self-pay | Admitting: Cardiovascular Disease

## 2023-10-22 DIAGNOSIS — G4733 Obstructive sleep apnea (adult) (pediatric): Secondary | ICD-10-CM | POA: Diagnosis not present

## 2023-10-22 DIAGNOSIS — Z7901 Long term (current) use of anticoagulants: Secondary | ICD-10-CM | POA: Insufficient documentation

## 2023-10-22 DIAGNOSIS — I4819 Other persistent atrial fibrillation: Secondary | ICD-10-CM

## 2023-10-22 DIAGNOSIS — I5030 Unspecified diastolic (congestive) heart failure: Secondary | ICD-10-CM | POA: Diagnosis not present

## 2023-10-22 DIAGNOSIS — E1122 Type 2 diabetes mellitus with diabetic chronic kidney disease: Secondary | ICD-10-CM | POA: Insufficient documentation

## 2023-10-22 DIAGNOSIS — I5032 Chronic diastolic (congestive) heart failure: Secondary | ICD-10-CM | POA: Insufficient documentation

## 2023-10-22 DIAGNOSIS — I130 Hypertensive heart and chronic kidney disease with heart failure and stage 1 through stage 4 chronic kidney disease, or unspecified chronic kidney disease: Secondary | ICD-10-CM | POA: Insufficient documentation

## 2023-10-22 DIAGNOSIS — K219 Gastro-esophageal reflux disease without esophagitis: Secondary | ICD-10-CM | POA: Insufficient documentation

## 2023-10-22 DIAGNOSIS — I2510 Atherosclerotic heart disease of native coronary artery without angina pectoris: Secondary | ICD-10-CM | POA: Diagnosis not present

## 2023-10-22 DIAGNOSIS — Z794 Long term (current) use of insulin: Secondary | ICD-10-CM | POA: Diagnosis not present

## 2023-10-22 DIAGNOSIS — I4891 Unspecified atrial fibrillation: Secondary | ICD-10-CM | POA: Diagnosis not present

## 2023-10-22 DIAGNOSIS — Z7984 Long term (current) use of oral hypoglycemic drugs: Secondary | ICD-10-CM | POA: Diagnosis not present

## 2023-10-22 DIAGNOSIS — I4892 Unspecified atrial flutter: Secondary | ICD-10-CM | POA: Diagnosis not present

## 2023-10-22 DIAGNOSIS — Z1211 Encounter for screening for malignant neoplasm of colon: Secondary | ICD-10-CM | POA: Diagnosis not present

## 2023-10-22 DIAGNOSIS — N183 Chronic kidney disease, stage 3 unspecified: Secondary | ICD-10-CM | POA: Insufficient documentation

## 2023-10-22 HISTORY — PX: CARDIOVERSION: SHX1299

## 2023-10-22 LAB — GLUCOSE, CAPILLARY: Glucose-Capillary: 139 mg/dL — ABNORMAL HIGH (ref 70–99)

## 2023-10-22 SURGERY — CARDIOVERSION
Anesthesia: General

## 2023-10-22 MED ORDER — SODIUM CHLORIDE 0.9 % IV SOLN: INTRAVENOUS | Status: DC

## 2023-10-22 MED ORDER — PROPOFOL 10 MG/ML IV BOLUS
INTRAVENOUS | Status: DC | PRN
Start: 2023-10-22 — End: 2023-10-22
  Administered 2023-10-22: 60 mg via INTRAVENOUS

## 2023-10-22 MED ORDER — PROPOFOL 10 MG/ML IV BOLUS
INTRAVENOUS | Status: AC
  Filled 2023-10-22: qty 20

## 2023-10-22 NOTE — Anesthesia Postprocedure Evaluation (Signed)
 Anesthesia Post Note  Patient: Donald [name]  Procedure(s) Performed: CARDIOVERSION  Patient location during evaluation: Specials Recovery Anesthesia Type: General Level of consciousness: awake and alert Pain management: pain level controlled Vital Signs Assessment: post-procedure vital signs reviewed and stable Respiratory status: spontaneous breathing, nonlabored ventilation, respiratory function stable and patient connected to nasal cannula oxygen Cardiovascular status: blood pressure returned to baseline and stable Postop Assessment: no apparent nausea or vomiting Anesthetic complications: no   No notable events documented.   Last Vitals:  Vitals:   10/22/23 0815 10/22/23 0831  BP: (!) 141/66 (!) 133/41  Pulse: (!) 55 (!) 46  Resp: (!) 21 20  Temp: 36.8 C   SpO2: 92% 93%    Last Pain:  Vitals:   10/22/23 0815  TempSrc: Temporal  PainSc:                  Fairy POUR Jovian Lembcke

## 2023-10-22 NOTE — Transfer of Care (Signed)
 Immediate Anesthesia Transfer of Care Note  Patient: Donald [name]  Procedure(s) Performed: CARDIOVERSION  Patient Location: Short Stay  Anesthesia Type:General  Level of Consciousness: drowsy  Airway & Oxygen Therapy: Patient Spontanous Breathing and Patient connected to nasal cannula oxygen  Post-op Assessment: Report given to RN and Post -op Vital signs reviewed and stable  Post vital signs: Reviewed and stable  Last Vitals:  Vitals Value Taken Time  BP 148/57 10/22/23 07:45  Temp    Pulse 63 10/22/23 07:48  Resp 19 10/22/23 07:48  SpO2 91 % 10/22/23 07:48    Last Pain:  Vitals:   10/22/23 0704  TempSrc: Temporal  PainSc: 3          Complications: No notable events documented.

## 2023-10-22 NOTE — Telephone Encounter (Signed)
 Pt c/o medication issue:  1. Name of Medication:   dronedarone  (MULTAQ ) 400 MG tablet    2. How are you currently taking this medication (dosage and times per day)?   Take 1 tablet (400 mg total) by mouth 2 (two) times daily with a meal.    3. Are you having a reaction (difficulty breathing--STAT)? No   4. What is your medication issue? Pt has been taking medication as prescribed, but would like to know if he should continue or discontinue after having his cardioversion procedure this morning. Please advise

## 2023-10-22 NOTE — Telephone Encounter (Signed)
 Called patient to advise he continue multaq 

## 2023-10-22 NOTE — Anesthesia Procedure Notes (Signed)
 Procedure Name: MAC Date/Time: 10/22/2023 7:40 AM  Performed by: Lacretia Camelia NOVAK, CRNAPre-anesthesia Checklist: Patient identified, Emergency Drugs available, Suction available and Patient being monitored Oxygen Delivery Method: Nasal cannula

## 2023-10-22 NOTE — Anesthesia Preprocedure Evaluation (Signed)
 Anesthesia Evaluation  Patient identified by MRN, date of birth, ID band Patient awake    Reviewed: Allergy & Precautions, NPO status , Patient's Chart, lab work & pertinent test results  History of Anesthesia Complications Negative for: history of anesthetic complications  Airway Mallampati: III  TM Distance: <3 FB Neck ROM: full    Dental  (+) Chipped   Pulmonary neg shortness of breath, asthma , sleep apnea    Pulmonary exam normal        Cardiovascular Exercise Tolerance: Good hypertension, (-) angina + CAD and + Past MI  + dysrhythmias Atrial Fibrillation + Valvular Problems/Murmurs  Rhythm:irregular Rate:Normal     Neuro/Psych  Neuromuscular disease    GI/Hepatic Neg liver ROS,GERD  Controlled,,  Endo/Other  negative endocrine ROSdiabetes, Type 2    Renal/GU Renal disease  negative genitourinary   Musculoskeletal   Abdominal   Peds  Hematology negative hematology ROS (+)   Anesthesia Other Findings Past Medical History: No date: (HFpEF) heart failure with preserved ejection fraction (HCC)     Comment:  a. 09/2018 Echo: EF 60-65%. PASP . Mild-mod LAE.               Mild MR/TR. 07/26/2006: ANEMIA-IRON DEFICIENCY No date: Anxiety 07/26/2006: ASTHMA No date: Asthma No date: Back pain, chronic No date: Bell's palsy No date: CKD (chronic kidney disease), stage III (HCC) 07/26/2006: COLONIC POLYPS, HX OF 01/2020: COVID-19 virus infection 03/14/2009: DEPRESSION 07/26/2006: DIABETES MELLITUS, TYPE II 10/05/2007: DISC DISEASE, LUMBAR 12/03/2007: DVT 04/13/2009: DYSLIPIDEMIA No date: Dyspnea     Comment:  when gets up and walks around and back is hurting really              bad -only Shortness of breath  then 07/26/2006: GERD No date: Gout No date: Heart murmur No date: History of kidney stones 07/26/2006: HYPERTENSION 08/21/2007: INSOMNIA No date: Neuropathy No date: Nonobstructive CAD (coronary  artery disease)     Comment:  a. 2012 Cath: no high grade stenosis; b. 2018 MV: No               ischemia. Attenuation artifact.  12/03/2007: OBSTRUCTIVE SLEEP APNEA     Comment:  Use C-PAP 07/26/2006: PERIPHERAL NEUROPATHY 11/20/2006: Pernicious anemia 10/05/2007: PULMONARY EMBOLISM 01/06/2023: Sacral wound, subsequent encounter     Comment:  S/p OR I&D 01/08/2023, completed doxycycline  course for               culture confirmed MRSA.    10/30/2020: Syncope and collapse     Comment:  zio patch 11/2020: Predominant sinus bradycardia with an              average rate of 54 betas per minute. Type I second               degree heart block was occasionally present w/o               significant pauses. Short lived SVT x1 with occasional               symptomatic PVCs, ventricular trigeminy present   01/29/2023: Varicella zoster No date: Vertigo  Past Surgical History: 1968: APPENDECTOMY 1977, 06/14/2008: BACK SURGERY     Comment:  Dr. Malcolm at [facility] (06/10) No date: CARDIAC CATHETERIZATION 03/11/2023: CARDIOVERSION; N/A     Comment:  Procedure: CARDIOVERSION;  Surgeon: Perla Evalene PARAS,               MD;  Location: [facility] ORS;  Service:  Cardiovascular;                Laterality: N/A; 10/2017: CARPAL TUNNEL RELEASE; Right 05/30/2014: CATARACT EXTRACTION W/PHACO; Left     Comment:  Procedure: CATARACT EXTRACTION PHACO AND INTRAOCULAR               LENS PLACEMENT (IOC);  Surgeon: Steven Dingeldein, MD;                Location: [facility] ORS;  Service: Ophthalmology;  Laterality:              Left;  US  01:20 AP% 23.7 CDE 31.86 09/06/2021: CATARACT EXTRACTION W/PHACO; Right     Comment:  Procedure: CATARACT EXTRACTION PHACO AND INTRAOCULAR               LENS PLACEMENT (IOC) RIGHT DIABETIC VISION BLUE OMIDRIA                MALYUGIN IRIS HOOKS;  Surgeon: Enola Feliciano Hugger,               MD;  Location: [facility];  Service:               Ophthalmology;  Laterality:  Right;  14.65 2.11.1 10/2002: COLONOSCOPY     Comment:  HP, SSA, TA, rpt 3 yrs (Medoff) 01/2005: COLONOSCOPY     Comment:  diverticulosis rpt 5 yrs (Medoff)  06/2021: EPIDURAL BLOCK INJECTION; Bilateral     Comment:  bilat L5 nerve block with benefit (Ramos) 01/08/2023: INCISION AND DRAINAGE PERIRECTAL ABSCESS; N/A     Comment:  Procedure: IRRIGATION AND DEBRIDEMENT SACRAL WOUND;                Surgeon: Marinda Jayson KIDD, MD;  Location: [facility] ORS;                Service: General;  Laterality: N/A; 10/19/2018: IR NEPHROSTOMY PLACEMENT LEFT 10/19/2018: IR URETERAL STENT LEFT NEW ACCESS W/O SEP NEPHROSTOMY CATH 11/01/2020: LEFT HEART CATH AND CORONARY ANGIOGRAPHY; N/A     Comment:  Procedure: LEFT HEART CATH AND CORONARY ANGIOGRAPHY;                Surgeon: Mady Bruckner, MD;  Location: [facility] INVASIVE               CV LAB;  Service: Cardiovascular;  Laterality: N/A; No date: LITHOTRIPSY     Comment:  X 2 10/19/2018: NEPHROLITHOTOMY; Left     Comment:  Procedure: NEPHROLITHOTOMY PERCUTANEOUS;  Surgeon:               Matilda Senior, MD;  Location: [facility] ORS;  Service:               Urology;  Laterality: Left;  3 HRS 04/18/2016: PARATHYROIDECTOMY; Right     Comment:  PARATHYROIDECTOMY for cyst Ole Hunter, MD) No date: SHOULDER ARTHROSCOPY W/ ROTATOR CUFF REPAIR; Right No date: TONSILLECTOMY  BMI    Body Mass Index: 39.01 kg/m      Reproductive/Obstetrics negative OB ROS                              Anesthesia Physical Anesthesia Plan  ASA: 3  Anesthesia Plan: General   Post-op Pain Management:    Induction: Intravenous  PONV Risk Score and Plan: Propofol  infusion and TIVA  Airway Management Planned: Natural Airway and Nasal Cannula  Additional Equipment:   Intra-op Plan:   Post-operative Plan:  Informed Consent: I have reviewed the patients History and Physical, chart, labs and discussed the procedure including the risks, benefits  and alternatives for the proposed anesthesia with the patient or authorized representative who has indicated his/her understanding and acceptance.     Dental Advisory Given  Plan Discussed with: Anesthesiologist, CRNA and Surgeon  Anesthesia Plan Comments: (Patient consented for risks of anesthesia including but not limited to:  - adverse reactions to medications - risk of airway placement if required - damage to eyes, teeth, lips or other oral mucosa - nerve damage due to positioning  - sore throat or hoarseness - Damage to heart, brain, nerves, lungs, other parts of body or loss of life  Patient voiced understanding and assent.)        Anesthesia Quick Evaluation

## 2023-10-22 NOTE — CV Procedure (Signed)
Cardioversion procedure note For atrial fibrillation, persistent  Procedure Details:  Consent: Risks of procedure as well as the alternatives and risks of each were explained to the (patient/caregiver).  Consent for procedure obtained.  Time Out: Verified patient identification, verified procedure, site/side was marked, verified correct patient position, special equipment/implants available, medications/allergies/relevent history reviewed, required imaging and test results available.  Performed  Patient placed on cardiac monitor, pulse oximetry, supplemental oxygen as necessary.   Sedation given: propofol IV, Dr. Piscitello Pacer pads placed anterior and posterior chest.   Cardioverted 1 time(s).   Cardioverted at 150 J. Synchronized biphasic Converted to NSR   Evaluation: Findings: Post procedure EKG shows: NSR Complications: None Patient did tolerate procedure well.  Time Spent Directly with the Patient:  45 minutes   Tim Gollan, M.D., Ph.D.  

## 2023-10-23 ENCOUNTER — Encounter: Payer: Self-pay | Admitting: Cardiovascular Disease

## 2023-10-24 ENCOUNTER — Ambulatory Visit: Admitting: Internal Medicine

## 2023-10-26 NOTE — Interval H&P Note (Signed)
 History and Physical Interval Note:  10/26/2023 12:33 PM  Donald [name]  has presented today for surgery, with the diagnosis of Cardioversion   Afib.  The various methods of treatment have been discussed with the patient and family. After consideration of risks, benefits and other options for treatment, the patient has consented to  Procedure(s): CARDIOVERSION (N/A) as a surgical intervention.  The patient's history has been reviewed, patient examined, no change in status, stable for surgery.  I have reviewed the patient's chart and labs.  Questions were answered to the patient's satisfaction.     Dawna Jakes

## 2023-10-26 NOTE — H&P (Signed)

## 2023-10-27 ENCOUNTER — Telehealth: Payer: Self-pay

## 2023-10-27 LAB — COLOGUARD: COLOGUARD: POSITIVE — AB

## 2023-10-27 NOTE — Telephone Encounter (Signed)
 Received patient assistance medications for patient.  Novolin  R * 32 boxes  Medications have been placed in the refrigerator and labeled with patient information  and Patient has been informed can pick up medications in our office during normal business hours.

## 2023-10-29 ENCOUNTER — Ambulatory Visit: Payer: Self-pay | Admitting: Family Medicine

## 2023-10-29 DIAGNOSIS — R195 Other fecal abnormalities: Secondary | ICD-10-CM

## 2023-10-30 DIAGNOSIS — G8929 Other chronic pain: Secondary | ICD-10-CM | POA: Diagnosis not present

## 2023-10-30 DIAGNOSIS — M545 Low back pain, unspecified: Secondary | ICD-10-CM | POA: Diagnosis not present

## 2023-10-31 ENCOUNTER — Encounter: Payer: Self-pay | Admitting: Internal Medicine

## 2023-10-31 ENCOUNTER — Ambulatory Visit (INDEPENDENT_AMBULATORY_CARE_PROVIDER_SITE_OTHER): Admitting: Internal Medicine

## 2023-10-31 VITALS — BP 136/80 | HR 56 | Ht 65.0 in | Wt 228.0 lb

## 2023-10-31 DIAGNOSIS — N1832 Chronic kidney disease, stage 3b: Secondary | ICD-10-CM | POA: Diagnosis not present

## 2023-10-31 DIAGNOSIS — E1122 Type 2 diabetes mellitus with diabetic chronic kidney disease: Secondary | ICD-10-CM

## 2023-10-31 DIAGNOSIS — E1165 Type 2 diabetes mellitus with hyperglycemia: Secondary | ICD-10-CM

## 2023-10-31 DIAGNOSIS — Z794 Long term (current) use of insulin: Secondary | ICD-10-CM

## 2023-10-31 DIAGNOSIS — E1142 Type 2 diabetes mellitus with diabetic polyneuropathy: Secondary | ICD-10-CM | POA: Diagnosis not present

## 2023-10-31 LAB — POCT GLYCOSYLATED HEMOGLOBIN (HGB A1C): Hemoglobin A1C: 7.2 % — AB (ref 4.0–5.6)

## 2023-10-31 MED ORDER — SEMAGLUTIDE (2 MG/DOSE) 8 MG/3ML ~~LOC~~ SOPN: 2.0000 mg | PEN_INJECTOR | SUBCUTANEOUS | 3 refills | Status: AC

## 2023-10-31 NOTE — Progress Notes (Signed)
 Name: Donald [name]  Age/ Sex: 78 y.o., male   MRN/ DOB: [id number], 1945/05/07     PCP: Rilla Baller, MD   Reason for Endocrinology Evaluation: Type 2 Diabetes Mellitus  Initial Endocrine Consultative Visit: 12/25/2011    PATIENT IDENTIFIER: [name] is a 78 y.o. male with a past medical history of DM, CAD, Asthma, OSA and Dyslipidemia . The patient has followed with Endocrinology clinic since 12/25/2011 for consultative assistance with management of his diabetes.  DIABETIC HISTORY:  Mr. Rosko was diagnosed with DM 1989.  Pioglitazone-swelling, Metformin - low GFR, he has been on insulin  since 1991. His hemoglobin A1c has ranged from 6.9% in 2019, peaking at 10.1% in 2012.   He was followed by Dr. Kassie from 2013 until 04/2021  Ozempic   started by PCP 02/2022   On his initial visit with me 03/2022 his A1c was 7.4% he was just started on Ozempic  by his PCP, with continued NPH and regular insulin  and was provided with a correction scale   SUBJECTIVE:   During the last visit (06/24/2023): A1c 7.2%    Today (10/31/2023): [name] is here for follow-up on diabetes management.  He checks his blood sugars 2-3 times daily. The patient has had hypoglycemic episodes since the last clinic visit,he is symptomatic with jittery and weakness    Patient continues to follow-up with Dr. Rayburn Patient continues to follow-up with [facility] He continues to follow-up with cardiology for CAD and A.Fib . S/P cardioversion for A.Fib  02/2023 and had another cardioversion 10/22/2023  NO recent intra-articular injections  No nausea or vomiting  No constipation or diarrhea  He had a recent fall after a mechanical injury with right shoulder and right leg pain but no fracture He had a positive Cologuard and has a pending GI appointment    HOME DIABETES REGIMEN:  Ozempic  1 mg weekly  Novolin -N 100 units bedtime - average ~ 80 units   Novolin -R  90 units before brunch and Supper - 60-80  units  CF: Novolin -R (BG-130/15)    Statin: Yes ACE-I/ARB: Yes    CONTINUOUS GLUCOSE MONITORING RECORD INTERPRETATION    Dates of Recording: 10/4-10/17/2025  Sensor description: Freestyle libre  Results statistics:   CGM use % of time 79  Average and SD 157/28.7  Time in range 73%  % Time Above 180 21  % Time above 250 4  % Time Below target 2   Glycemic patterns summary: BGs are optimal most nights and days Hyperglycemic episodes postprandial  Hypoglycemic episodes occurred during the day  Overnight periods: Trends down to optimal    DIABETIC COMPLICATIONS: Microvascular complications:  CKD, neuropathy Denies:  Last Eye Exam: Completed   Macrovascular complications:  CAD Denies:  CVA, PVD   HISTORY:  Past Medical History:  Past Medical History:  Diagnosis Date   (HFpEF) heart failure with preserved ejection fraction (HCC)    a. 09/2018 Echo: EF 60-65%. PASP . Mild-mod LAE. Mild MR/TR.   ANEMIA-IRON DEFICIENCY 07/26/2006   Anxiety    ASTHMA 07/26/2006   Asthma    Back pain, chronic    Bell's palsy    CKD (chronic kidney disease), stage III (HCC)    COLONIC POLYPS, HX OF 07/26/2006   COVID-19 virus infection 01/2020   DEPRESSION 03/14/2009   DIABETES MELLITUS, TYPE II 07/26/2006   DISC DISEASE, LUMBAR 10/05/2007   DVT 12/03/2007   DYSLIPIDEMIA 04/13/2009   Dyspnea    when gets up  and walks around and back is hurting really bad -only Shortness of breath  then   GERD 07/26/2006   Gout    Heart murmur    History of kidney stones    HYPERTENSION 07/26/2006   INSOMNIA 08/21/2007   Neuropathy    Nonobstructive CAD (coronary artery disease)    a. 2012 Cath: no high grade stenosis; b. 2018 MV: No ischemia. Attenuation artifact.    OBSTRUCTIVE SLEEP APNEA 12/03/2007   Use C-PAP   PERIPHERAL NEUROPATHY 07/26/2006   Pernicious anemia 11/20/2006   PULMONARY EMBOLISM 10/05/2007   Sacral  wound, subsequent encounter 01/06/2023   S/p OR I&D 01/08/2023, completed doxycycline  course for culture confirmed MRSA.      Syncope and collapse 10/30/2020   zio patch 11/2020: Predominant sinus bradycardia with an average rate of 54 betas per minute.  Type I second degree heart block was occasionally present w/o significant pauses. Short lived SVT x1 with occasional symptomatic PVCs, ventricular trigeminy present     Varicella zoster 01/29/2023   Vertigo    Past Surgical History:  Past Surgical History:  Procedure Laterality Date   APPENDECTOMY  1968   BACK SURGERY  1977, 06/14/2008   Dr. Malcolm at [facility] (06/10)   CARDIAC CATHETERIZATION     CARDIOVERSION N/A 03/11/2023   Procedure: CARDIOVERSION;  Surgeon: Perla Evalene PARAS, MD;  Location: [facility] OR[facility];  Service: Cardiovascular;  Laterality: N/A;   CARDIOVERSION N/A 10/22/2023   Procedure: CARDIOVERSION;  Surgeon: Perla Evalene PARAS, MD;  Location: [facility];  Service: Cardiovascular;  Laterality: N/A;   CARPAL TUNNEL RELEASE Right 10/2017   CATARACT EXTRACTION W/PHACO Left 05/30/2014   Procedure: CATARACT EXTRACTION PHACO AND INTRAOCULAR LENS PLACEMENT (IOC);  Surgeon: Steven Dingeldein, MD;  Location: [facility];  Service: Ophthalmology;  Laterality: Left;  US  01:20 AP% 23.7 CDE 31.86   CATARACT EXTRACTION W/PHACO Right 09/06/2021   Procedure: CATARACT EXTRACTION PHACO AND INTRAOCULAR LENS PLACEMENT (IOC) RIGHT DIABETIC VISION BLUE OMIDRIA  MALYUGIN IRIS HOOKS;  Surgeon: Enola Feliciano Hugger, MD;  Location: [facility];  Service: Ophthalmology;  Laterality: Right;  14.65 2.11.1   COLONOSCOPY  10/2002   HP, SSA, TA, rpt 3 yrs ([place])   COLONOSCOPY  01/2005   diverticulosis rpt 5 yrs (Medoff)    EPIDURAL BLOCK INJECTION Bilateral 06/2021   bilat L5 nerve block with benefit ([place])   INCISION AND DRAINAGE PERIRECTAL ABSCESS N/A 01/08/2023   Procedure: IRRIGATION AND DEBRIDEMENT SACRAL WOUND;  Surgeon: Marinda Jayson KIDD,  MD;  Location: [facility];  Service: General;  Laterality: N/A;   IR NEPHROSTOMY PLACEMENT LEFT  10/19/2018   IR URETERAL STENT LEFT NEW ACCESS W/O SEP NEPHROSTOMY CATH  10/19/2018   LEFT HEART CATH AND CORONARY ANGIOGRAPHY N/A 11/01/2020   Procedure: LEFT HEART CATH AND CORONARY ANGIOGRAPHY;  Surgeon: Mady Bruckner, MD;  Location: [facility];  Service: Cardiovascular;  Laterality: N/A;   LITHOTRIPSY     X 2   NEPHROLITHOTOMY Left 10/19/2018   Procedure: NEPHROLITHOTOMY PERCUTANEOUS;  Surgeon: Matilda Senior, MD;  Location: [facility];  Service: Urology;  Laterality: Left;  3 HRS   PARATHYROIDECTOMY Right 04/18/2016   PARATHYROIDECTOMY for cyst Ole Hunter, MD)   SHOULDER ARTHROSCOPY W/ ROTATOR CUFF REPAIR Right    TONSILLECTOMY     Social History:  reports that he has never smoked. He has never been exposed to tobacco smoke. He has never used smokeless tobacco. He reports that he does not drink alcohol and does not use drugs. Family  History:  Family History  Problem Relation Age of Onset   Cancer Mother        Breast Cancer   Cancer Sister        Breast Cancer   Diabetes Father    Heart disease Father    Diabetes Paternal Grandfather      HOME MEDICATIONS: Allergies as of 10/31/2023       Reactions   Codeine  Other (See Comments)   REACTION: chest pain   Pioglitazone Other (See Comments)   REACTION to Actos: swelling in ankles        Medication List        Accurate as of October 31, 2023  1:27 PM. If you have any questions, ask your nurse or doctor.          albuterol  108 (90 Base) MCG/ACT inhaler Commonly known as: VENTOLIN  HFA Inhale 2 puffs into the lungs every 6 (six) hours as needed for wheezing or shortness of breath.   allopurinol  300 MG tablet Commonly known as: ZYLOPRIM  Take 1 tablet (300 mg total) by mouth daily.   colchicine  0.6 MG tablet TAKE 1 TABLET BY MOUTH DAILY AS NEEDED FOR GOUT GLARE. ON FIRST DAY OF GOUT FLARE,MAY TAKE 2  TABLETS AT ONCE AS DIRECTED   cyanocobalamin  1000 MCG/ML injection Commonly known as: VITAMIN B12 INJECT 1ML INTRAMUSCULARLY EVERY 30 DAYS   doxazosin  1 MG tablet Commonly known as: CARDURA  TAKE ONE TABLET BY MOUTH TWICE DAILY   Eliquis  5 MG Tabs tablet Generic drug: apixaban  TAKE ONE (1) TABLET BY MOUTH TWO TIMES PER DAY   FreeStyle Libre 2 Sensor Misc 1 Device by Does not apply route every 14 (fourteen) days.   furosemide  80 MG tablet Commonly known as: LASIX  TAKE 1 TABLET BY MOUTH EVERY DAY. MAY TAKE AN EXTRA PILL FOR SWELLING.   gabapentin  600 MG tablet Commonly known as: NEURONTIN  Take 2 tablets (1,200 mg total) by mouth at bedtime.   GE100 Blood Glucose Test test strip Generic drug: glucose blood TEST FOUR TIMES DAILY   HYDROcodone -acetaminophen  10-325 MG tablet Commonly known as: NORCO Take 1 tablet by mouth every 6 (six) hours as needed for moderate pain ((typically once to twice daily if needed)).   insulin  NPH Human 100 UNIT/ML injection Commonly known as: HumuLIN  N Inject 1 mL (100 Units total) into the skin at bedtime.   insulin  regular 100 units/mL injection Commonly known as: NovoLIN  R INJECT 60 UNITS BEFORE BRUNCH AND SUPPER. ADD EXTRA UNITS ON INSULIN  TO YOUR MEAL IF YOUR BLOOD SUGARS ARE HIGHER THAN 155   isosorbide  mononitrate 30 MG 24 hr tablet Commonly known as: IMDUR  TAKE ONE TABLET BY MOUTH DAILY   losartan  25 MG tablet Commonly known as: COZAAR  Take 1 tablet (25 mg total) by mouth at bedtime.   lovastatin  40 MG tablet Commonly known as: MEVACOR  TAKE TWO TABLETS BY MOUTH AT BEDTIME   meclizine 25 MG tablet Commonly known as: ANTIVERT Take 25 mg by mouth 3 (three) times daily as needed for dizziness.   Multaq  400 MG tablet Generic drug: dronedarone  Take 1 tablet (400 mg total) by mouth 2 (two) times daily with a meal.   nitroGLYCERIN  0.4 MG SL tablet Commonly known as: NITROSTAT  PLACE 1 TABLET UNDER TONGUE EVERY 5 MIN AS NEEDED FOR  CHEST PAIN IF NO RELIEF IN15 MIN CALL 911 (MAX 3 TABS)   OPCON-A  OP Place 1 drop into both eyes 3 (three) times daily as needed (itchy eyes.).   Semaglutide  (1  MG/DOSE) 4 MG/3ML Sopn Inject 1 mg as directed once a week.   STOOL SOFTENER & LAXATIVE PO Take 2 tablets by mouth at bedtime.   SYRINGE 3CC/22GX1-1/2 22G X 1-1/2 3 ML Misc Use to administer monthly b12 shots   Vitamin D3 25 MCG (1000 UT) Caps Take 1 capsule (1,000 Units total) by mouth daily.         OBJECTIVE:   Vital Signs: BP 136/80 (BP Location: Left Arm, Patient Position: Sitting, Cuff Size: Normal)   Pulse (!) 56   Ht 5' 5 (1.651 m)   Wt 228 lb (103.4 kg)   SpO2 98%   BMI 37.94 kg/m   Wt Readings from Last 3 Encounters:  10/31/23 228 lb (103.4 kg)  10/22/23 234 lb 6.4 oz (106.3 kg)  10/02/23 230 lb (104.3 kg)    Exam: General: Pt appears well and is in NAD  Lungs: Clear with good BS bilat   Heart: RRR   Extremities: Trace  pretibial edema.   Neuro: MS is good with appropriate affect, pt is alert and Ox3   Dm Foot Exam : 06/24/2023  The skin of the feet is intact without sores or ulcerations. The pedal pulses are 1+ on right and 1+ on left. The sensation is decreased  to a screening 5.07, 10 gram monofilament bilaterally    DATA REVIEWED:  Lab Results  Component Value Date   HGBA1C 7.2 (A) 10/31/2023   HGBA1C 7.2 (A) 06/24/2023   HGBA1C 6.6 (A) 02/19/2023     Latest Reference Range & Units 10/02/23 12:41  Sodium 134 - 144 mmol/L 143  Potassium 3.5 - 5.2 mmol/L 5.2  Chloride 96 - 106 mmol/L 98  CO2 20 - 29 mmol/L 25  Glucose 70 - 99 mg/dL 806 (H)  BUN 8 - 27 mg/dL 42 (H)  Creatinine 9.23 - 1.27 mg/dL 8.25 (H)  Calcium  8.6 - 10.2 mg/dL 9.8  BUN/Creatinine Ratio 10 - 24  24  eGFR >59 mL/min/1.73 40 (L)     Old records , labs and images have been reviewed.   ASSESSMENT / PLAN / RECOMMENDATIONS:   1) Type 2 Diabetes Mellitus, Optimally  controlled, With neuropathic, CKD III  complications - Most recent A1c of 7.2%. Goal A1c < 7.0 %.    -A1c is stable and remains acceptable -He has been noted with hypoglycemia during the day on CGM download, the patient does adjust his insulin  doses based on glucose readings as well as meals -I have recommended the following insulin  dose change -Patient tells me he is on Ozempic  2 mg, I do not see this on his medication list, I will update the dose  MEDICATIONS: Take Ozempic  2 mg weekly Novolin -N 50 units QHS Novolin -R 70  units before brunch and supper Continue correction scale: Novolin -R (BG -130/15)  EDUCATION / INSTRUCTIONS: BG monitoring instructions: Patient is instructed to check his blood sugars 3 times a day, before each meal. Call [place] [facility] if: BG persistently < 70  I reviewed the Rule of 15 for the treatment of hypoglycemia in detail with the patient. Literature supplied.    2) Diabetic complications:  Eye: Does not have known diabetic retinopathy.  Neuro/ Feet: Does  have known diabetic peripheral neuropathy .  Renal: Patient does  have known baseline CKD. He   is  on an ACEI/ARB at present.     F/U in 6 months     In addition to time spent performing glucose monitor, I spent 25 minutes preparing  to see the patient by review of recent labs, imaging and procedures, obtaining and reviewing separately obtained history, communicating with the patient/family or caregiver, ordering medications, tests or procedures, and documenting clinical information in the EHR including the differential Dx, treatment, and any further evaluation and other management    Signed electronically by: Stefano Redgie Butts, MD  [facility] Endocrinology  [facility] [address] [place] [place], [place] [address] Phone: [phone] FAX: [phone]   CC: Rilla Baller, MD [address] [place] [address] Phone: [phone]  Fax: [phone]  Return to Endocrinology  clinic as below: Future Appointments  Date Time Provider Department Center  11/05/2023 10:30 AM Riddle, Suzann, NP [facility] None  11/07/2023  2:30 PM Rilla Baller, MD [facility] [address]

## 2023-10-31 NOTE — Patient Instructions (Addendum)
 Take Ozempic  2 mg weekly  Novolin -N 50 units at  bedtime Novolin -R  70 units  before brunch and Supper  Novolin -R correctional insulin : ADD extra units on insulin  to your meal-time Novolin - R dose if your blood sugars are higher than 155. Use the scale below to help guide you:   Blood sugar before meal Number of units to inject  Less than 145 0 unit  146 - 160 1 units  161 - 175 2 units  176 - 190 3 units  191 - 205 4 units  206 - 220 5 units  221 - 235 6 units  236 - 250 7 units   251 - 265 8 units      HOW TO TREAT LOW BLOOD SUGARS (Blood sugar LESS THAN 70 MG/DL) Please follow the RULE OF 15 for the treatment of hypoglycemia treatment (when your (blood sugars are less than 70 mg/dL)   STEP 1: Take 15 grams of carbohydrates when your blood sugar is low, which includes:  3-4 GLUCOSE TABS  OR 3-4 OZ OF JUICE OR REGULAR SODA OR ONE TUBE OF GLUCOSE GEL    STEP 2: RECHECK blood sugar in 15 MINUTES STEP 3: If your blood sugar is still low at the 15 minute recheck --> then, go back to STEP 1 and treat AGAIN with another 15 grams of carbohydrates.

## 2023-11-04 NOTE — Progress Notes (Unsigned)
 Electrophysiology Clinic Note    Date:  11/05/2023  Patient ID:  Donald [name], Donald [name] 15-Aug-1945, MRN [id number] PCP:  Rilla Baller, MD  Cardiologist:  Evalene Lunger, MD   Electrophysiologist:  Fonda Kitty, MD  Electrophysiology APP:  Theia Dezeeuw, NP     Discussed the use of AI scribe software for clinical note transcription with the patient, who gave verbal consent to proceed.   Patient Profile    Chief Complaint: AFib, aflutter follow-up  History of Present Illness: Donald [name] is a 78 y.o. male with PMH notable for persis AFib, aflutter, bradycardia, HFimpEF, OSA on CPAP, HTN, T2DM (poorly controlled), CKD-3, sacral wound; seen today for Fonda Kitty, MD for routine electrophysiology followup.   He last saw Dr. Kitty 04/2023 for initial EP evaluation of AFib, flutter. His AFib was first diagnosed 12/2022 when he was in hospital for sacral wound 2/2 MRSA. He had afib w slow ventricular response. He is s/p DCCV 02/2023 without any change in fatigue. He believed his symptoms were mostly d/t deconditioning and chronic back pain. During appt with Dr. Kitty, patient stated he would be having spinal injections soon and so eliquis  would need to be paused for this. They elected to not pursue rhythm control strategy at that time given his planned anticoagulation interruption.   He saw PA Franchester 7/15. He had spinal injections that did not help his functional status and his provider recommended spinal cord stimulator. Eliquis  had not been held for the injections.   I saw him 9/18 where he remained in aflutter, and started multaq . He is s/p DCCV 10/8 with conversion to sinus rhythm.   On follow-up today, he has had no change in his symptoms since DCCV. He uses his BP machine to monitor for arrhythmia about once a week. Bp machine did accurately alert for Aflutter in the paste.  He continues to take multaq  BID and eliquis  BID, no missed doses. No bleeding concerns.   He  continues to have significant back pain, has appt with ortho later this afternoon. Continues to attend PT sessions without improvement.   He denies chest pain, chest pressure, palpitations, dizziness or presyncope. No increased edema or SOB.    Arrhythmia/Device History No specialty comments available.    ROS:  Please see the history of present illness. All other systems are reviewed and otherwise negative.    Physical Exam    VS:  BP 128/68   Pulse 61   Ht 5' 5.5 (1.664 m)   Wt 227 lb 12.8 oz (103.3 kg)   SpO2 98%   BMI 37.33 kg/m  BMI: Body mass index is 37.33 kg/m.      Wt Readings from Last 3 Encounters:  11/05/23 227 lb 12.8 oz (103.3 kg)  10/31/23 228 lb (103.4 kg)  10/22/23 234 lb 6.4 oz (106.3 kg)     GEN- The patient is well appearing, alert and oriented x 3 today.   Lungs- Clear to ausculation bilaterally, normal work of breathing.  Heart- Regular rate and rhythm, no murmurs, rubs or gallops Extremities- Trace peripheral edema, warm, dry   Studies Reviewed   Previous EP, cardiology notes.    EKG is ordered. Personal review of EKG from today shows:    EKG Interpretation Date/Time:  Wednesday November 05 2023 10:37:34 EDT Ventricular Rate:  61 PR Interval:  348 QRS Duration:  158 QT Interval:  478 QTC Calculation: 481 R Axis:   -32  Text Interpretation: Sinus rhythm with 1st  degree A-V block Left axis deviation Right bundle branch block Confirmed by Raykwon Hobbs [phone]) on 11/05/2023 11:04:35 AM    TTE, 04/29/2023 1. Left ventricular ejection fraction, by estimation, is 60 to 65%. The left ventricle has normal function. The left ventricle has no regional wall motion abnormalities. There is mild left ventricular hypertrophy. Left ventricular diastolic parameters are indeterminate.   2. Right ventricular systolic function is normal. The right ventricular size is normal. Tricuspid regurgitation signal is inadequate for assessing PA pressure.   3. Left  atrial size was moderately dilated.   4. The mitral valve is normal in structure. Mild mitral valve regurgitation. No evidence of mitral stenosis.   5. The aortic valve is normal in structure. There is mild calcification of the aortic valve. Aortic valve regurgitation is not visualized. Aortic valve sclerosis/calcification is present, without any evidence of aortic stenosis.   6. The inferior vena cava is normal in size with greater than 50% respiratory variability, suggesting right atrial pressure of 3 mmHg.   Long term monitor, 04/28/2023 Atrial Flutter occurred continuously (100% burden), ranging from 28-126 bpm (avg of 52 bpm).   88 Pauses occurred, the longest lasting 5.1 secs (12 bpm).   1 run of Ventricular Tachycardia occurred lasting 4 beats with a max rate of 110 bpm (avg 101 bpm).   Isolated VEs were occasional (1.2%, 10813), VE Couplets were rare (<1.0%, 688), and no VE Triplets were present.  Ventricular Bigeminy and Trigeminy were present.   Difficulty discerning atrial activity making definitive diagnosis difficult to ascertain.  No patient triggered events recorded  Assessment and Plan   #) persis AFib #) aflutter #) bradycardia Recently started on multaq  S/p DCCV 10/8 and maintaining sinus rhythm No cardiac awareness Continue to monitor rhythm with BP machine Continue 400mg  multaq  BID  #) Hypercoag d/t long standing persis afib CHA2DS2-VASc Score = at least 5 [CHF History: 1, HTN History: 1, Diabetes History: 1, Stroke History: 0, Vascular Disease History: 0, Age Score: 2, Gender Score: 0].  Therefore, the patient's annual risk of stroke is 7.2 %.    Stroke ppx - 5mg  eliquis  BID, appropriately dosed No bleeding concerns   #) HTN Well controlled in office today Continue to check readings at home  #) OSA Continue nightly CPAP           Current medicines are reviewed at length with the patient today.   The patient does not have concerns regarding his  medicines.  The following changes were made today:   none  Labs/ tests ordered today include:  Orders Placed This Encounter  Procedures   EKG 12-Lead     Disposition: Follow up with Dr. Kennyth or EP APP in 6 months  Follow-up with general cardiology - Dr Perla or APP in 3 months as previously recommended   Signed, Korri Ask, NP  11/05/23  11:04 AM  Electrophysiology [facility]

## 2023-11-05 ENCOUNTER — Ambulatory Visit: Attending: Cardiology | Admitting: Cardiology

## 2023-11-05 ENCOUNTER — Encounter: Payer: Self-pay | Admitting: Cardiology

## 2023-11-05 VITALS — BP 128/68 | HR 61 | Ht 65.5 in | Wt 227.8 lb

## 2023-11-05 DIAGNOSIS — I4891 Unspecified atrial fibrillation: Secondary | ICD-10-CM | POA: Diagnosis not present

## 2023-11-05 DIAGNOSIS — I4892 Unspecified atrial flutter: Secondary | ICD-10-CM | POA: Diagnosis not present

## 2023-11-05 DIAGNOSIS — I4819 Other persistent atrial fibrillation: Secondary | ICD-10-CM

## 2023-11-05 DIAGNOSIS — M25551 Pain in right hip: Secondary | ICD-10-CM | POA: Diagnosis not present

## 2023-11-05 DIAGNOSIS — D6869 Other thrombophilia: Secondary | ICD-10-CM | POA: Diagnosis not present

## 2023-11-05 DIAGNOSIS — I10 Essential (primary) hypertension: Secondary | ICD-10-CM

## 2023-11-05 NOTE — Patient Instructions (Signed)
 Medication Instructions:  Your physician recommends that you continue on your current medications as directed. Please refer to the Current Medication list given to you today.   *If you need a refill on your cardiac medications before your next appointment, please call your pharmacy*  Lab Work: No labs ordered today  If you have labs (blood work) drawn today and your tests are completely normal, you will receive your results only by: MyChart Message (if you have MyChart) OR A paper copy in the mail If you have any lab test that is abnormal or we need to change your treatment, we will call you to review the results.  Testing/Procedures: No test ordered today   Follow-Up: At [facility], you and your health needs are our priority.  As part of our continuing mission to provide you with exceptional heart care, our providers are all part of one team.  This team includes your primary Cardiologist (physician) and Advanced Practice Providers or APPs (Physician Assistants and Nurse Practitioners) who all work together to provide you with the care you need, when you need it.  Your next appointment:   6 month(s)  Provider:   Suzann Riddle, NP or Dr. Kennyth    We recommend signing up for the patient portal called MyChart.  Sign up information is provided on this After Visit Summary.  MyChart is used to connect with patients for Virtual Visits (Telemedicine).  Patients are able to view lab/test results, encounter notes, upcoming appointments, etc.  Non-urgent messages can be sent to your provider as well.   To learn more about what you can do with MyChart, go to [email or web address].

## 2023-11-06 DIAGNOSIS — G8929 Other chronic pain: Secondary | ICD-10-CM | POA: Diagnosis not present

## 2023-11-06 DIAGNOSIS — M545 Low back pain, unspecified: Secondary | ICD-10-CM | POA: Diagnosis not present

## 2023-11-07 ENCOUNTER — Ambulatory Visit: Admitting: Family Medicine

## 2023-11-09 ENCOUNTER — Other Ambulatory Visit: Payer: Self-pay | Admitting: Family Medicine

## 2023-11-09 DIAGNOSIS — E1165 Type 2 diabetes mellitus with hyperglycemia: Secondary | ICD-10-CM

## 2023-11-19 ENCOUNTER — Ambulatory Visit

## 2023-11-19 DIAGNOSIS — M79672 Pain in left foot: Secondary | ICD-10-CM

## 2023-11-19 DIAGNOSIS — M19072 Primary osteoarthritis, left ankle and foot: Secondary | ICD-10-CM | POA: Diagnosis not present

## 2023-11-19 DIAGNOSIS — M7672 Peroneal tendinitis, left leg: Secondary | ICD-10-CM

## 2023-11-19 MED ORDER — TRIAMCINOLONE ACETONIDE 10 MG/ML IJ SUSP
5.0000 mg | Freq: Once | INTRAMUSCULAR | Status: AC
  Administered 2023-11-19: 5 mg via INTRAMUSCULAR

## 2023-11-19 NOTE — Progress Notes (Signed)
 Chief Complaint  Patient presents with   Foot Pain    Injection. Pain scale 8/10. OTC meds do not ease pain.    HPI: 78 y.o. male presenting today for follow-up evaluation of left foot pain.  Injection into his left peroneus brevis insertion approximately 1 year ago that provided him excellent relief.  He is currently back in pain, 8 out of 10.  He is being evaluated for back issues and is receiving injections as well as physical therapy.  PMHx uncontrolled diabetes type 2 with peripheral polyneuropathy, chronic CKD.  Patient states that the injections in the past have helped significantly.  Past Medical History:  Diagnosis Date   (HFpEF) heart failure with preserved ejection fraction (HCC)    a. 09/2018 Echo: EF 60-65%. PASP . Mild-mod LAE. Mild MR/TR.   ANEMIA-IRON DEFICIENCY 07/26/2006   Anxiety    ASTHMA 07/26/2006   Asthma    Back pain, chronic    Bell's palsy    CKD (chronic kidney disease), stage III (HCC)    COLONIC POLYPS, HX OF 07/26/2006   COVID-19 virus infection 01/2020   DEPRESSION 03/14/2009   DIABETES MELLITUS, TYPE II 07/26/2006   DISC DISEASE, LUMBAR 10/05/2007   DVT 12/03/2007   DYSLIPIDEMIA 04/13/2009   Dyspnea    when gets up and walks around and back is hurting really bad -only Shortness of breath  then   GERD 07/26/2006   Gout    Heart murmur    History of kidney stones    HYPERTENSION 07/26/2006   INSOMNIA 08/21/2007   Neuropathy    Nonobstructive CAD (coronary artery disease)    a. 2012 Cath: no high grade stenosis; b. 2018 MV: No ischemia. Attenuation artifact.    OBSTRUCTIVE SLEEP APNEA 12/03/2007   Use C-PAP   PERIPHERAL NEUROPATHY 07/26/2006   Pernicious anemia 11/20/2006   PULMONARY EMBOLISM 10/05/2007   Sacral wound, subsequent encounter 01/06/2023   S/p OR I&D 01/08/2023, completed doxycycline  course for culture confirmed MRSA.      Syncope and collapse 10/30/2020   zio patch 11/2020: Predominant sinus bradycardia with an average  rate of 54 betas per minute.  Type I second degree heart block was occasionally present w/o significant pauses. Short lived SVT x1 with occasional symptomatic PVCs, ventricular trigeminy present     Varicella zoster 01/29/2023   Vertigo      Physical Exam: General: The patient is alert and oriented x3 in no acute distress.  Dermatology: Skin is warm, dry and supple bilateral lower extremities. Negative for open lesions or macerations.  Vascular: Palpable pedal pulses bilaterally. No edema or erythema noted. Capillary refill within normal limits.  Neurological: Gross intact via light touch  Musculoskeletal Exam: Mild tenderness to palpation left fifth metatarsal base at insertion of peroneus brevis.  No pain with resisted eversion.  Full muscle strength. Moderate tenderness to palpation of left 4/5 tarsometatarsal joints.  Pes cavus foot shape.  Radiographic Exam LT foot  Normal osseous mineralization.  There is joint space narrowing and articular degenerative changes of the fifth metatarsal cuboid joint.  There is a cortical irregularity along the joint.  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  It appears well-circumscribed and chronic in nature. Minimal changes compared to previous imaging. This does not correlate to the patient's symptoms.  No pathologic fracture identified.  There may be some chronic old pathologic fractures in the past that have healed.  Assessment: 1.  Insertional peroneal tendinitis left  2.  Lateral midfoot arthritis, left 3.  Cavus foot type left  Plan of Care:  -Patient evaluated -Due to patient getting relief from both peroneal insertion and lateral midfoot articular injections, performed injection into each. Procedure note below. We discussed injection precautions.  -- Patient to do lateral ankle tendon and ligament strengthening while he is in PT for his back. I wrote down recommendations for exercises on a post it note and he  will bring that to his PT.  -History of CKD.  No NSAIDs.  Recommend exercise Tylenol  as needed -Advised against going barefoot.  Recommend good supportive shoes -Return to clinic as needed  Procedure: Injection into left 5th peroneal sheath from lateral and separately into 4/5 TMT's from dorsal Skin Prep: alcohol Injectate: Total injectate: 0.5cc 0.5%marcaine  plain, 0.5cc 2%lidocaine  plain, 0.5cc kenalog  40. 2/3 of injection into TMT 4/5, 1/3 of injection into peroneal sheath Disposition: Patient tolerated procedure well. Injection site dressed with a band-aid.   Prentice Ovens, DPM    [address] [place], [place] [address]                Office [phone]  Fax [phone]

## 2023-11-29 ENCOUNTER — Other Ambulatory Visit: Payer: Self-pay | Admitting: Medical

## 2023-11-29 DIAGNOSIS — I10 Essential (primary) hypertension: Secondary | ICD-10-CM

## 2023-11-29 DIAGNOSIS — I4892 Unspecified atrial flutter: Secondary | ICD-10-CM

## 2023-12-29 ENCOUNTER — Ambulatory Visit: Payer: Self-pay

## 2023-12-29 NOTE — Telephone Encounter (Signed)
 Attempt # 1 to reach patient to triage symptoms. Left VM to call back. Advised ED/ UC for emergent issues   Copied from CRM [id number]. Topic: Clinical - Red Word Triage >> Dec 29, 2023 11:50 AM Delon DASEN wrote: Red Word that prompted transfer to Nurse Triage: Patient not eating and not talking right, blood sugar 136 >> Dec 29, 2023  1:47 PM Olam RAMAN wrote: Wife priscilla calling back for spouse/pt Told wife to hold for nurse and she hung up again >> Dec 29, 2023 12:07 PM Delon DASEN wrote: [phone]- wife asking for call back , does not want to hold anymore

## 2023-12-30 NOTE — Telephone Encounter (Signed)
 I spoke with pts wife (DPR signed) and pt and pt said he is feeling fine today; pts wife said pt is acting normal today.only complaint today is pt has rt thigh pain but pt said he hurt all the time anyway. Pt has no CP,SOB,H/A or dizziness. FBS today 119. Advised pt glad he is feeling better but still needs to be evaluated for slow walking and not talking normally on 12/29/23; pt said he will go to [facility] walk in today for eval. Pt will call 12/31/23 with update on how is doing also., sending note to Dr KANDICE and I have spoke with [facility] CMA.

## 2023-12-30 NOTE — Telephone Encounter (Signed)
 Patient's wife called back in to let me know that she is comfortable driving and getting him to the doctor if something becomes available. Wife requesting if anything opens up with Dr. KANDICE to please call her [phone]

## 2023-12-30 NOTE — Telephone Encounter (Signed)
 Left message to return call to our office.

## 2023-12-30 NOTE — Telephone Encounter (Signed)
 Agree with evaluation today. If has not gone to urgent care, ok to double book at 3pm today.

## 2023-12-30 NOTE — Telephone Encounter (Signed)
 FYI Only or Action Required?: FYI only for provider: ED advised.  Patient was last seen in primary care on 07/09/2023 by Rilla Baller, MD.  Called Nurse Triage reporting Fatigue.  Symptoms began several days ago.  Interventions attempted: Nothing.  Symptoms are: gradually worsening.  Triage Disposition: Go to ED Now (or PCP Triage)  Patient/caregiver understands and will follow disposition?: No, wishes to speak with PCP   Reason for Disposition  Patient sounds very sick or weak to the triager  Answer Assessment - Initial Assessment Questions Patient's wife [name] calling in today. Type 2 diabetic, has not checked blood sugar today. 2-3 BM yesterday, wife reports straining. Reports patient walking very slowly. [name] adamant on patient being seen in PCP office today, advised there are no available appointments and advising ED based on symptoms, wife reports not being able to drive and declined calling EMS. States her son is getting off work later to drive them to either UC or ED. Wife adamant about seeing Dr. KANDICE today   1. DESCRIPTION: Describe how you are feeling.     Wife reports patient wanting to sleep all the time since Saturday  2. SEVERITY: How bad is it?  Can you stand and walk?     Wife reports having to force him to get up and sit in his recliner  3. ONSET: When did these symptoms begin? (e.g., hours, days, weeks, months)     3 days ago  4. CAUSE: What do you think is causing the weakness or fatigue? (e.g., not drinking enough fluids, medical problem, trouble sleeping)     Unsure  5. NEW MEDICINES:  Have you started on any new medicines recently? (e.g., opioid pain medicines, benzodiazepines, muscle relaxants, antidepressants, antihistamines, neuroleptics, beta blockers)     Denies  6. OTHER SYMPTOMS: Do you have any other symptoms? (e.g., chest pain, fever, cough, SOB, vomiting, diarrhea, bleeding, other areas of pain)     Decrease appetite  starting 3 days ago, ate soup yesterday and small sausage biscuit, 1/2 ham sandwich. Knee pain  Protocols used: Weakness (Generalized) and Fatigue-A-AH

## 2023-12-30 NOTE — Telephone Encounter (Signed)
 Called patient and wife. Advised we have no appointments open in our office. He declined to go to ED and will just wait until someone here will see him. Ok to put him in same day you have open tomorrow?

## 2023-12-30 NOTE — Telephone Encounter (Signed)
 Mail box full not able to leave message. Will call back

## 2023-12-31 ENCOUNTER — Telehealth: Payer: Self-pay | Admitting: Family Medicine

## 2023-12-31 NOTE — Telephone Encounter (Signed)
 Noted. Thanks.

## 2023-12-31 NOTE — Telephone Encounter (Signed)
 Can we try to reach wife [name] Donald [name]?  I don't see where he was seen for this yesterday.

## 2023-12-31 NOTE — Telephone Encounter (Signed)
Was not able to reach patient.

## 2023-12-31 NOTE — Telephone Encounter (Signed)
 Copied from CRM #[id number]. Topic: General - Other >> Dec 31, 2023  3:39 PM Eva FALCON wrote: Reason for CRM: Pt wife [name] was calling in and states [name] wanting to let [name] know that he is feeling fine now, he has been checking his bp and blood sugars and they have been fine, he is up and moving, and even drove from [place] today. Any other questions please reach out [phone].

## 2024-01-01 NOTE — Telephone Encounter (Signed)
 Duplicate message patient wife called with update.  No further action needed at this time.

## 2024-01-01 NOTE — Telephone Encounter (Signed)
 Called patient left message to call office.  Called wife voicemail full not able to leave message.

## 2024-01-05 ENCOUNTER — Telehealth: Payer: Self-pay | Admitting: Family Medicine

## 2024-01-05 ENCOUNTER — Other Ambulatory Visit: Payer: Self-pay | Admitting: Cardiology

## 2024-01-05 ENCOUNTER — Ambulatory Visit: Admitting: Family Medicine

## 2024-01-05 ENCOUNTER — Encounter: Payer: Self-pay | Admitting: Family Medicine

## 2024-01-05 VITALS — BP 138/82 | HR 60 | Temp 97.6°F | Ht 65.5 in | Wt 239.4 lb

## 2024-01-05 DIAGNOSIS — M545 Low back pain, unspecified: Secondary | ICD-10-CM | POA: Diagnosis not present

## 2024-01-05 DIAGNOSIS — N183 Chronic kidney disease, stage 3 unspecified: Secondary | ICD-10-CM | POA: Diagnosis not present

## 2024-01-05 DIAGNOSIS — E66813 Obesity, class 3: Secondary | ICD-10-CM

## 2024-01-05 DIAGNOSIS — G8929 Other chronic pain: Secondary | ICD-10-CM

## 2024-01-05 DIAGNOSIS — I480 Paroxysmal atrial fibrillation: Secondary | ICD-10-CM

## 2024-01-05 DIAGNOSIS — R5381 Other malaise: Secondary | ICD-10-CM

## 2024-01-05 DIAGNOSIS — E1122 Type 2 diabetes mellitus with diabetic chronic kidney disease: Secondary | ICD-10-CM

## 2024-01-05 DIAGNOSIS — I4819 Other persistent atrial fibrillation: Secondary | ICD-10-CM

## 2024-01-05 LAB — POC URINALSYSI DIPSTICK (AUTOMATED)
Bilirubin, UA: NEGATIVE
Glucose, UA: NEGATIVE
Ketones, UA: NEGATIVE
Leukocytes, UA: NEGATIVE
Nitrite, UA: NEGATIVE
Protein, UA: NEGATIVE
Spec Grav, UA: 1.01
Urobilinogen, UA: 0.2 U/dL
pH, UA: 6

## 2024-01-05 NOTE — Patient Instructions (Addendum)
 Weight gain noted.  Cut down on sodas, increase water.  Labs today , urine test today.  Continue current medicines.  Return in 3 months for follow up visit

## 2024-01-05 NOTE — Telephone Encounter (Signed)
 Can we order rollator walker with seat for patient? Thanks

## 2024-01-05 NOTE — Progress Notes (Unsigned)
 " Ph: [phone] Fax: [phone]   Patient ID: Donald [name], male    DOB: 02-13-1945, 78 y.o.   MRN: [id number]  This visit was conducted in person.  BP 138/82 (Cuff Size: Normal)   Pulse 60   Temp 97.6 F (36.4 C) (Oral)   Ht 5' 5.5 (1.664 m)   Wt 239 lb 6.4 oz (108.6 kg)   SpO2 96%   BMI 39.23 kg/m   Pulse Readings from Last 3 Encounters:  01/06/24 60  11/05/23 61  10/31/23 (!) 56   CC: low back pain  Subjective:   HPI: Donald [name] is a 78 y.o. male presenting on 01/05/2024 for medical managment (Both sides of lower back hurts pt only when walking, hx of ruptured disk /Currently see's pt and does injections but states nothing is helping)   Sees EP/cardiology regularly for persistent afib/aflutter, bradycardia, recently started on Multaq  400mg  bid s/p DCCV 10/22/2023, maintaining NSR since.  Sees endocrinologist for diabetes management.  Lab Results  Component Value Date   HGBA1C 7.2 (A) 10/31/2023   Recently saw nephrology Dr Rayburn early December - stable period based on labs. Cr 2.0, GFR 33 at that tim.   Sees PM&R Dr Bonner last 10/2023, undergoing PT with limited effect. Most recently had bilateral L5 selective nerve block 11/2023 - without significant improvement. Planning to see neurosurgery in [place] for second opinion (Dr Marolyn Breeding). Was evaluated for spinal cord stimulator - declined at this time.  Notes posture progressively worsens in forward bending when walking.  Feels better when sitting.   Last week on his birthday felt ill with no appetite, wife notes he had altered mental status - he declined EMS and ER evaluation. The next day he felt better.   He drinks a lot of diet Dr Nunzio and Coke and less water.   Forgot to take Multaq  medication today.      Relevant past medical, surgical, family and social history reviewed and updated as indicated. Interim medical history since our last visit reviewed. Allergies and medications  reviewed and updated. Outpatient Medications Prior to Visit  Medication Sig Dispense Refill   albuterol  (VENTOLIN  HFA) 108 (90 Base) MCG/ACT inhaler Inhale 2 puffs into the lungs every 6 (six) hours as needed for wheezing or shortness of breath. 8 g 0   allopurinol  (ZYLOPRIM ) 300 MG tablet Take 1 tablet (300 mg total) by mouth daily. 90 tablet 3   Cholecalciferol (VITAMIN D3) 25 MCG (1000 UT) CAPS Take 1 capsule (1,000 Units total) by mouth daily. 30 capsule    colchicine  0.6 MG tablet TAKE 1 TABLET BY MOUTH DAILY AS NEEDED FOR GOUT GLARE. ON FIRST DAY OF GOUT FLARE,MAY TAKE 2 TABLETS AT ONCE AS DIRECTED (Patient taking differently: as needed.) 30 tablet 0   Continuous Glucose Sensor (FREESTYLE LIBRE 2 SENSOR) MISC USE ONE SENSOR EVERY 14 DAYS 6 each 0   cyanocobalamin  (VITAMIN B12) 1000 MCG/ML injection INJECT 1ML INTRAMUSCULARLY EVERY 30 DAYS 1 mL 6   doxazosin  (CARDURA ) 1 MG tablet TAKE ONE TABLET BY MOUTH TWICE DAILY 180 tablet 3   dronedarone  (MULTAQ ) 400 MG tablet Take 1 tablet (400 mg total) by mouth 2 (two) times daily with a meal. 180 tablet 3   ELIQUIS  5 MG TABS tablet TAKE ONE (1) TABLET BY MOUTH TWO TIMES PER DAY 60 tablet 5   furosemide  (LASIX ) 80 MG tablet TAKE 1 TABLET BY MOUTH EVERY DAY. MAY TAKE AN EXTRA PILL FOR SWELLING. 90 tablet 3  gabapentin  (NEURONTIN ) 600 MG tablet Take 2 tablets (1,200 mg total) by mouth at bedtime. 180 tablet 3   glucose blood (GE100 BLOOD GLUCOSE TEST) test strip TEST FOUR TIMES DAILY 200 each 10   HYDROcodone -acetaminophen  (NORCO) 10-325 MG tablet Take 1 tablet by mouth every 6 (six) hours as needed for moderate pain ((typically once to twice daily if needed)).  30 tablet 0   insulin  NPH Human (HUMULIN  N) 100 UNIT/ML injection Inject 1 mL (100 Units total) into the skin at bedtime. 100 mL 3   insulin  regular (NOVOLIN  R) 100 units/mL injection INJECT 60 UNITS BEFORE BRUNCH AND SUPPER. ADD EXTRA UNITS ON INSULIN  TO YOUR MEAL IF YOUR BLOOD SUGARS ARE HIGHER  THAN 155     isosorbide  mononitrate (IMDUR ) 30 MG 24 hr tablet TAKE ONE TABLET BY MOUTH DAILY 90 tablet 3   losartan  (COZAAR ) 25 MG tablet TAKE 1 TABLET BY MOUTH AT BEDTIME 90 tablet 2   lovastatin  (MEVACOR ) 40 MG tablet TAKE TWO TABLETS BY MOUTH AT BEDTIME 180 tablet 1   meclizine (ANTIVERT) 25 MG tablet Take 25 mg by mouth 3 (three) times daily as needed for dizziness.     Naphazoline-Pheniramine (OPCON-A  OP) Place 1 drop into both eyes 3 (three) times daily as needed (itchy eyes.).      nitroGLYCERIN  (NITROSTAT ) 0.4 MG SL tablet PLACE 1 TABLET UNDER TONGUE EVERY 5 MIN AS NEEDED FOR CHEST PAIN IF NO RELIEF IN15 MIN CALL 911 (MAX 3 TABS) 25 tablet 11   Semaglutide , 2 MG/DOSE, 8 MG/3ML SOPN Inject 2 mg as directed once a week. 9 mL 3   Sennosides-Docusate Sodium  (STOOL SOFTENER & LAXATIVE PO) Take 2 tablets by mouth at bedtime.     Syringe/Needle, Disp, (SYRINGE 3CC/22GX1-1/2) 22G X 1-1/2 3 ML MISC Use to administer monthly b12 shots 50 each 0   No facility-administered medications prior to visit.     Per HPI unless specifically indicated in ROS section below Review of Systems  Objective:  BP 138/82 (Cuff Size: Normal)   Pulse 60   Temp 97.6 F (36.4 C) (Oral)   Ht 5' 5.5 (1.664 m)   Wt 239 lb 6.4 oz (108.6 kg)   SpO2 96%   BMI 39.23 kg/m   Wt Readings from Last 3 Encounters:  01/05/24 239 lb 6.4 oz (108.6 kg)  11/05/23 227 lb 12.8 oz (103.3 kg)  10/31/23 228 lb (103.4 kg)      Physical Exam Vitals and nursing note reviewed.  Constitutional:      Appearance: Normal appearance. He is obese.     Comments: Ambulates with walker  HENT:     Head: Normocephalic and atraumatic.     Mouth/Throat:     Mouth: Mucous membranes are moist.     Pharynx: Oropharynx is clear. No oropharyngeal exudate or posterior oropharyngeal erythema.  Eyes:     Extraocular Movements: Extraocular movements intact.     Pupils: Pupils are equal, round, and reactive to light.  Cardiovascular:      Rate and Rhythm: Normal rate. Rhythm irregularly irregular.     Heart sounds: Murmur (2/6 systolic) heard.  Pulmonary:     Effort: Pulmonary effort is normal. No respiratory distress.     Breath sounds: Normal breath sounds. No wheezing, rhonchi or rales.  Abdominal:     General: There is no distension.     Palpations: Abdomen is soft. There is no mass.     Tenderness: There is no abdominal tenderness. There is no guarding  or rebound.     Hernia: No hernia is present.  Musculoskeletal:        General: Swelling: non pitting.     Right lower leg: Edema present.     Left lower leg: Edema present.  Skin:    General: Skin is warm and dry.     Findings: No rash.  Neurological:     Mental Status: He is alert.  Psychiatric:        Mood and Affect: Mood normal.        Behavior: Behavior normal.       Results for orders placed or performed in visit on 01/05/24  POCT Urinalysis Dipstick (Automated)   Collection Time: 01/05/24  4:53 PM  Result Value Ref Range   Color, UA yellow    Clarity, UA clear    Glucose, UA Negative Negative   Bilirubin, UA Negative    Ketones, UA Negative    Spec Grav, UA 1.010 1.010 - 1.025   Blood, UA trace    pH, UA 6.0 5.0 - 8.0   Protein, UA Negative Negative   Urobilinogen, UA 0.2 0.2 or 1.0 E.U./dL   Nitrite, UA Negative    Leukocytes, UA Negative Negative   *Note: Due to a large number of results and/or encounters for the requested time period, some results have not been displayed. A complete set of results can be found in Results Review.  Micro: WBC rare  RBC 0-3  Bact tr  Casts none  Epi rare  UCx not sent   Assessment & Plan:   Problem List Items Addressed This Visit     Chronic low back pain   See PM&R Dr Bonner, on chronic hydrocodone .  Pending neurosurgery evaluation for second opinion.  Ongoing lower back pain consistent with lumbar stenosis affecting ambulation. Will order rolling walker with seat.       Obesity, Class III, BMI  40-49.9 (morbid obesity) (HCC)   Chronic, weight gain noted despite ozemipc 2mg  weekly.  Encouraged limiting diet sodas.       CKD stage 3 due to type 2 diabetes mellitus (HCC) - Primary   Sees nephrology, last seen earlier this month.  Latest kidney function with Cr 2.0 GFR 33.  Update labs today with recent episode of acute malaise.       Relevant Orders   Comprehensive metabolic panel with GFR   CBC with Differential/Platelet   POCT Urinalysis Dipstick (Automated) (Completed)   Paroxysmal atrial fibrillation (HCC)   Chronic, staying irregular despite recent DCCV  10/2023 and Multaq  commencement.  Continue eliquis . Update labs including liver function now on Multaq .  May recommend cardiology f/u pending lab results.       Malaise   Recent episode of malaise/fatigue, anorexia, and possible AMS on his birthday last week.  Episode lasted 1d in duration and then symptoms resolved. Update labwork today.         No orders of the defined types were placed in this encounter.   Orders Placed This Encounter  Procedures   Comprehensive metabolic panel with GFR   CBC with Differential/Platelet   POCT Urinalysis Dipstick (Automated)    Patient Instructions  Weight gain noted.  Cut down on sodas, increase water.  Labs today , urine test today.  Continue current medicines.  Return in 3 months for follow up visit   Follow up plan: Return in about 3 months (around 04/04/2024), or if symptoms worsen or fail to improve, for follow up visit.  Donald Castelo  Rilla, MD   "

## 2024-01-06 ENCOUNTER — Other Ambulatory Visit: Payer: Self-pay | Admitting: Family Medicine

## 2024-01-06 ENCOUNTER — Ambulatory Visit: Payer: Self-pay | Admitting: Family Medicine

## 2024-01-06 DIAGNOSIS — R5381 Other malaise: Secondary | ICD-10-CM | POA: Insufficient documentation

## 2024-01-06 DIAGNOSIS — Z79899 Other long term (current) drug therapy: Secondary | ICD-10-CM

## 2024-01-06 DIAGNOSIS — E1165 Type 2 diabetes mellitus with hyperglycemia: Secondary | ICD-10-CM

## 2024-01-06 LAB — CBC WITH DIFFERENTIAL/PLATELET
Basophils Absolute: 0.1 K/uL (ref 0.0–0.1)
Basophils Relative: 0.8 % (ref 0.0–3.0)
Eosinophils Absolute: 0.2 K/uL (ref 0.0–0.7)
Eosinophils Relative: 2.2 % (ref 0.0–5.0)
HCT: 35.5 % — ABNORMAL LOW (ref 39.0–52.0)
Hemoglobin: 11.9 g/dL — ABNORMAL LOW (ref 13.0–17.0)
Lymphocytes Relative: 14.4 % (ref 12.0–46.0)
Lymphs Abs: 1.1 K/uL (ref 0.7–4.0)
MCHC: 33.6 g/dL (ref 30.0–36.0)
MCV: 89.3 fl (ref 78.0–100.0)
Monocytes Absolute: 0.4 K/uL (ref 0.1–1.0)
Monocytes Relative: 5.9 % (ref 3.0–12.0)
Neutro Abs: 5.7 K/uL (ref 1.4–7.7)
Neutrophils Relative %: 76.7 % (ref 43.0–77.0)
Platelets: 159 K/uL (ref 150.0–400.0)
RBC: 3.98 Mil/uL — ABNORMAL LOW (ref 4.22–5.81)
RDW: 17.2 % — ABNORMAL HIGH (ref 11.5–15.5)
WBC: 7.4 K/uL (ref 4.0–10.5)

## 2024-01-06 LAB — COMPREHENSIVE METABOLIC PANEL WITH GFR
ALT: 11 U/L (ref 3–53)
AST: 9 U/L (ref 5–37)
Albumin: 3.8 g/dL (ref 3.5–5.2)
Alkaline Phosphatase: 92 U/L (ref 39–117)
BUN: 74 mg/dL — ABNORMAL HIGH (ref 6–23)
CO2: 28 meq/L (ref 19–32)
Calcium: 8.8 mg/dL (ref 8.4–10.5)
Chloride: 106 meq/L (ref 96–112)
Creatinine, Ser: 2.46 mg/dL — ABNORMAL HIGH (ref 0.40–1.50)
GFR: 24.91 mL/min — ABNORMAL LOW
Glucose, Bld: 88 mg/dL (ref 70–99)
Potassium: 4.4 meq/L (ref 3.5–5.1)
Sodium: 144 meq/L (ref 135–145)
Total Bilirubin: 1.3 mg/dL — ABNORMAL HIGH (ref 0.2–1.2)
Total Protein: 5.6 g/dL — ABNORMAL LOW (ref 6.0–8.3)

## 2024-01-06 MED ORDER — ISOSORBIDE MONONITRATE ER 30 MG PO TB24: 30.0000 mg | ORAL_TABLET | Freq: Every day | ORAL | 2 refills | Status: AC

## 2024-01-06 NOTE — Assessment & Plan Note (Addendum)
 Chronic, staying irregular despite recent DCCV  10/2023 and Multaq  commencement.  Continue eliquis . Update labs including liver function now on Multaq .  May recommend cardiology f/u pending lab results.

## 2024-01-06 NOTE — Telephone Encounter (Signed)
 ERx

## 2024-01-06 NOTE — Assessment & Plan Note (Signed)
 Chronic, weight gain noted despite ozemipc 2mg  weekly.  Encouraged limiting diet sodas.

## 2024-01-06 NOTE — Assessment & Plan Note (Signed)
 Recent episode of malaise/fatigue, anorexia, and possible AMS on his birthday last week.  Episode lasted 1d in duration and then symptoms resolved. Update labwork today.

## 2024-01-06 NOTE — Assessment & Plan Note (Signed)
 See PM&R Dr Bonner, on chronic hydrocodone .  Pending neurosurgery evaluation for second opinion.  Ongoing lower back pain consistent with lumbar stenosis affecting ambulation. Will order rolling walker with seat.

## 2024-01-06 NOTE — Telephone Encounter (Signed)
 Prescription refill request for Eliquis  received. Indication: Atrial Flutter Last office visit: 11/05/23 Scr: 2.42 12//22/25 Care Everywhere Age: 78 Weight: 108.6 Pt has an extremely high Creatine. Please address this Refill.

## 2024-01-06 NOTE — Telephone Encounter (Signed)
 Prescription refill request for Eliquis  received. Indication:afib Last office visit:10/25 Scr: 2.46  12/25 Age:78 Weight:108.6  kg  Prescription refilled

## 2024-01-06 NOTE — Assessment & Plan Note (Addendum)
 Sees nephrology, last seen earlier this month.  Latest kidney function with Cr 2.0 GFR 33.  Update labs today with recent episode of acute malaise.

## 2024-01-06 NOTE — Telephone Encounter (Signed)
 Order placed on website.

## 2024-01-09 ENCOUNTER — Telehealth: Payer: Self-pay | Admitting: Physician Assistant

## 2024-01-09 NOTE — Telephone Encounter (Signed)
 Patient paged after our answering service requesting some guidance on his furosemide .  He has been on 80 mg daily of furosemide  since at least February 2025.  Previously renal function has always been stable with creatinine ranges between 1.6-1.8.  Most recent blood work obtained by PCP however showed creatinine jumped up to 2.4, therefore Dr. Rilla his primary care provider has instructed the patient to hold furosemide  for 5 days.  After holding the furosemide  for 3 days, he started noticing bilateral feet edema.  He denies any significant shortness of breath.  It is already 6 PM on Friday night.  I asked him to restart furosemide  on Saturday morning.  He is to keep daily weight diary and bring it to the next cardiology office visit on 01/13/2024 with Cadence Furth.  He will need a basic metabolic panel at that time.  If renal function is stable, likely will continue on the current dose of the diuretic.

## 2024-01-12 NOTE — Telephone Encounter (Signed)
 Noted. Cardiology held lasix  x5d. Appreciate cardiology seeing pt tomorrow.

## 2024-01-14 ENCOUNTER — Ambulatory Visit: Attending: Medical | Admitting: Medical

## 2024-01-14 NOTE — Progress Notes (Deleted)
" °  Cardiology Office Note   Date:  01/14/2024  ID:  Donald [name], DOB [birth year], MRN [id number] PCP: Donald Baller, MD  [facility] Providers Cardiologist:  Donald Lunger, MD Electrophysiologist:  Donald Kitty, MD  Electrophysiology APP:  [name], Suzann, NP { Click to update primary MD,subspecialty MD or APP then REFRESH:1}    History of Present Illness Donald [name] is a 78 y.o. male with a history of HFimpEF, persistent Afib/flutter, nonobstructive CAD, poorly controlled insulin -dependent diabetes, hyperlipidemia, PAD, DVT and extensive bilateral pulmonary edema 2009, CKD stage III, OSA on CPAP, asthmatic bronchitis, morbid obesity, gout, GERD, anemia, history of hematuria, status post parathyroidectomy on the right side in 2019, hypercalcemia, chronic back pain status post surgeries who is being seen for follow-up of Afib.   Echo in 2022 showed LVEF 45-50%, G3DD. LHC 10/2020 showed mild nonobstructive CAD, mild AS. Heart monitor for syncope in 11/2020 showed NSR, intermittent BBB, I run of SVT, second degree AV block.    The patient was to [facility] in late December 2024 with A-fib with slow ventricular response, sacral wound secondary to MRSA, chronic combined systolic and diastolic heart failure.  He was found to have slow Afib with HR as low as 37 while sleeping. He was noted to have pauses of 4.28 seconds on telemetry.  He is not on AV nodal blocking agents.  Due to CHA2DS2-VASc of 5 he was started on Eliquis .   He was seen 03/03/23 and was in aflutter with rates in the 70s and he was set up for cardioversion. He underwent DCCV 03/11/23 with conversion to NSR. He was seen back in follow-up 03/28/23 and was back in Afib with slow ventricular response. He was referred to EP, who recommended revisiting DCCV and or AA in 3 months.   He was seen 07/29/23 and was in rate controlled Aflutter and referred to EP. He saw EP 9/18 and he remained in Aflutter and was started on multaq .  He underwent DCCV 10/8 with conversion to SR.   Today,   ROS: ***  Studies Reviewed      *** Risk Assessment/Calculations {Does this patient have ATRIAL FIBRILLATION?:[phone]} No BP recorded.  {Refresh Note OR Click here to enter BP  :1}***       Physical Exam VS:  There were no vitals taken for this visit.       Wt Readings from Last 3 Encounters:  01/05/24 239 lb 6.4 oz (108.6 kg)  11/05/23 227 lb 12.8 oz (103.3 kg)  10/31/23 228 lb (103.4 kg)    GEN: Well nourished, well developed in no acute distress NECK: No JVD; No carotid bruits CARDIAC: ***RRR, no murmurs, rubs, gallops RESPIRATORY:  Clear to auscultation without rales, wheezing or rhonchi  ABDOMEN: Soft, non-tender, non-distended EXTREMITIES:  No edema; No deformity   ASSESSMENT AND PLAN ***    {Are you ordering a CV Procedure (e.g. stress test, cath, DCCV, TEE, etc)?   Press F2        :[id number]}  Dispo: ***  Signed, Donald Fuhriman VEAR Fishman, PA-C   "

## 2024-01-27 ENCOUNTER — Ambulatory Visit: Admitting: Podiatry

## 2024-01-29 ENCOUNTER — Telehealth: Payer: Self-pay

## 2024-01-29 ENCOUNTER — Other Ambulatory Visit: Payer: Self-pay

## 2024-01-29 ENCOUNTER — Telehealth: Payer: Self-pay | Admitting: Cardiovascular Disease

## 2024-01-29 DIAGNOSIS — R195 Other fecal abnormalities: Secondary | ICD-10-CM

## 2024-01-29 DIAGNOSIS — Z860100 Personal history of colon polyps, unspecified: Secondary | ICD-10-CM

## 2024-01-29 MED ORDER — NA SULFATE-K SULFATE-MG SULF 17.5-3.13-1.6 GM/177ML PO SOLN: 354.0000 mL | Freq: Once | ORAL | 0 refills | Status: AC

## 2024-01-29 NOTE — Telephone Encounter (Signed)
 Cardiac clearance and Eliquis  hold form was faxed to Dr. Tresia office. Awaiting on response. Procedure date: 02/16/2024 [facility] Dr. Theophilus

## 2024-01-29 NOTE — Telephone Encounter (Signed)
"  ° °  Pre-operative Risk Assessment    Patient Name: Donald [name]  DOB: [birth year] MRN: [id number]   Date of last office visit: 11/05/2023 Date of next office visit: 02/03/2024   Request for Surgical Clearance    Procedure:  colonoscopy  Date of Surgery:  Clearance 02/16/24                                Surgeon:  Dr. Aloysius Nap Surgeon's Group or Practice Name:  [facility] Phone number:  [phone] Fax number:  [phone]   Type of Clearance Requested:   - Pharmacy:  Hold Apixaban  (Eliquis )     Type of Anesthesia:  General    Additional requests/questions:    Donald [name] [employer]   01/29/2024, 5:01 PM   "

## 2024-01-29 NOTE — Telephone Encounter (Signed)
 Gastroenterology Pre-Procedure Review  Request Date: 02/16/2024 Requesting Physician: Dr. Theophilus  PATIENT REVIEW QUESTIONS: The patient responded to the following health history questions as indicated:    1. Are you having any GI issues? no 2. Do you have a personal history of Polyps? yes (11/11/2005 Polyps) 3. Do you have a family history of Colon Cancer or Polyps? no 4. Diabetes Mellitus? yes (Ozempic  and insulin ) 5. Joint replacements in the past 12 months?no 6. Major health problems in the past 3 months?no 7. Any artificial heart valves, MVP, or defibrillator?no    MEDICATIONS & ALLERGIES:    Patient reports the following regarding taking any anticoagulation/antiplatelet therapy:   Plavix, Coumadin, Eliquis , Xarelto, Lovenox, Pradaxa, Brilinta, or Effient? yes (Eliquis ) Aspirin ? no  Patient confirms/reports the following medications:  Current Outpatient Medications  Medication Sig Dispense Refill   albuterol  (VENTOLIN  HFA) 108 (90 Base) MCG/ACT inhaler Inhale 2 puffs into the lungs every 6 (six) hours as needed for wheezing or shortness of breath. 8 g 0   allopurinol  (ZYLOPRIM ) 300 MG tablet Take 1 tablet (300 mg total) by mouth daily. 90 tablet 3   ALPRAZolam  (XANAX ) 1 MG tablet      Aspirin  81 MG CAPS Take 1 mg by mouth daily.     Cholecalciferol (VITAMIN D3) 25 MCG (1000 UT) CAPS Take 1 capsule (1,000 Units total) by mouth daily. 30 capsule    colchicine  0.6 MG tablet TAKE 1 TABLET BY MOUTH DAILY AS NEEDED FOR GOUT GLARE. ON FIRST DAY OF GOUT FLARE,MAY TAKE 2 TABLETS AT ONCE AS DIRECTED (Patient taking differently: as needed.) 30 tablet 0   Continuous Glucose Sensor (FREESTYLE LIBRE 2 SENSOR) MISC USE ONE SENSOR EVERY 14 DAYS 6 each 3   cyanocobalamin  (VITAMIN B12) 1000 MCG/ML injection INJECT 1ML INTRAMUSCULARLY EVERY 30 DAYS 1 mL 6   doxazosin  (CARDURA ) 1 MG tablet TAKE ONE TABLET BY MOUTH TWICE DAILY 180 tablet 3   dronedarone  (MULTAQ ) 400 MG tablet Take 1 tablet (400 mg  total) by mouth 2 (two) times daily with a meal. 180 tablet 3   ELIQUIS  5 MG TABS tablet TAKE ONE (1) TABLET BY MOUTH TWO TIMES PER DAY 60 tablet 5   furosemide  (LASIX ) 80 MG tablet TAKE 1 TABLET BY MOUTH EVERY DAY. MAY TAKE AN EXTRA PILL FOR SWELLING. 90 tablet 3   furosemide  (LASIX ) 80 MG tablet Take 80 mg by mouth daily.     gabapentin  (NEURONTIN ) 600 MG tablet Take 2 tablets (1,200 mg total) by mouth at bedtime. 180 tablet 3   glucose blood (GE100 BLOOD GLUCOSE TEST) test strip TEST FOUR TIMES DAILY 200 each 10   HYDROcodone -acetaminophen  (NORCO) 10-325 MG tablet Take 1 tablet by mouth every 6 (six) hours as needed for moderate pain ((typically once to twice daily if needed)).  30 tablet 0   insulin  NPH Human (HUMULIN  N) 100 UNIT/ML injection Inject 1 mL (100 Units total) into the skin at bedtime. 100 mL 3   insulin  regular (NOVOLIN  R) 100 units/mL injection INJECT 60 UNITS BEFORE BRUNCH AND SUPPER. ADD EXTRA UNITS ON INSULIN  TO YOUR MEAL IF YOUR BLOOD SUGARS ARE HIGHER THAN 155     isosorbide  mononitrate (IMDUR ) 30 MG 24 hr tablet Take 1 tablet (30 mg total) by mouth daily. 90 tablet 2   losartan  (COZAAR ) 25 MG tablet TAKE 1 TABLET BY MOUTH AT BEDTIME 90 tablet 2   lovastatin  (MEVACOR ) 40 MG tablet TAKE TWO TABLETS BY MOUTH AT BEDTIME 180 tablet 1   lovastatin  (  MEVACOR ) 40 MG tablet Take 40 mg by mouth at bedtime.     meclizine (ANTIVERT) 25 MG tablet Take 25 mg by mouth 3 (three) times daily as needed for dizziness.     Naphazoline-Pheniramine (OPCON-A  OP) Place 1 drop into both eyes 3 (three) times daily as needed (itchy eyes.).      nitroGLYCERIN  (NITROSTAT ) 0.4 MG SL tablet PLACE 1 TABLET UNDER TONGUE EVERY 5 MIN AS NEEDED FOR CHEST PAIN IF NO RELIEF IN15 MIN CALL 911 (MAX 3 TABS) 25 tablet 11   Semaglutide , 2 MG/DOSE, 8 MG/3ML SOPN Inject 2 mg as directed once a week. 9 mL 3   Sennosides-Docusate Sodium  (STOOL SOFTENER & LAXATIVE PO) Take 2 tablets by mouth at bedtime.     Syringe/Needle,  Disp, (SYRINGE 3CC/22GX1-1/2) 22G X 1-1/2 3 ML MISC Use to administer monthly b12 shots 50 each 0   vitamin B-12 (CYANOCOBALAMIN ) 100 MCG tablet Take 100 mcg by mouth daily.     No current facility-administered medications for this visit.    Patient confirms/reports the following allergies:  Allergies[1]  No orders of the defined types were placed in this encounter.   AUTHORIZATION INFORMATION Primary Insurance: 1D#: Group #:  Secondary Insurance: 1D#: Group #:  SCHEDULE INFORMATION: Date: 02/16/2024 Time: Location: [facility] Dr. Theophilus     [1]  Allergies Allergen Reactions   Codeine  Other (See Comments)    REACTION: chest pain   Pioglitazone Other (See Comments)    REACTION to Actos: swelling in ankles

## 2024-01-30 NOTE — Telephone Encounter (Signed)
 Patient has an appointment with Dr. Gollan scheduled for 02/03/2024. Therefore, pre-op risk assessment can be completed at that time. I will route clearance form to Dr. Gollan and add pre-op eval to appointment notes so Dr. Gollan is aware.   I will also go ahead and route to pharmacy pool for recommendations on holding Eliquis  in case appointment with Dr. Gollan gets cancelled and patient gets rescheduled with an APP.  Will remove from pre-op pool.  ~Cordaryl Decelles

## 2024-01-30 NOTE — Telephone Encounter (Signed)
 Patient with diagnosis of persis AFib, aflutter  on Eliquis  for anticoagulation.    Procedure: colonoscopy  Date of procedure: 02/16/2024   CHA2DS2-VASc Score = 5   This indicates a 7.2% annual risk of stroke. The patient's score is based upon: CHF History: 1 HTN History: 1 Diabetes History: 1 Stroke History: 0 Vascular Disease History: 0 Age Score: 2 Gender Score: 0     CrCl 29 mL/min  Platelet count 159 K  Patient has not had an Afib/aflutter ablation in the last 3 months, DCCV within the last 4 weeks or a watchman implanted in the last 45 days    Per office protocol, patient can hold Eliquis  for 1-2 days prior to procedure.     **This guidance is not considered finalized until pre-operative APP has relayed final recommendations.**

## 2024-02-02 ENCOUNTER — Ambulatory Visit: Admitting: Podiatry

## 2024-02-02 ENCOUNTER — Ambulatory Visit

## 2024-02-02 ENCOUNTER — Encounter: Payer: Self-pay | Admitting: Podiatry

## 2024-02-02 VITALS — Ht 65.5 in | Wt 239.4 lb

## 2024-02-02 DIAGNOSIS — M19072 Primary osteoarthritis, left ankle and foot: Secondary | ICD-10-CM | POA: Diagnosis not present

## 2024-02-02 DIAGNOSIS — M79672 Pain in left foot: Secondary | ICD-10-CM | POA: Diagnosis not present

## 2024-02-02 MED ORDER — METHYLPREDNISOLONE 4 MG PO TBPK: ORAL_TABLET | ORAL | 0 refills | Status: AC

## 2024-02-02 MED ORDER — BETAMETHASONE SOD PHOS & ACET 6 (3-3) MG/ML IJ SUSP
3.0000 mg | Freq: Once | INTRAMUSCULAR | Status: AC
  Administered 2024-02-02: 3 mg via INTRA_ARTICULAR

## 2024-02-02 NOTE — Progress Notes (Unsigned)
 "        Date:  02/02/2024   ID:  Donald [name], DOB Dec 24, 1945, MRN [id number]  Patient Location:  [address] [place] [address]   Provider location:   [facility], [place] office  PCP:  Rilla Baller, MD  Cardiologist:  [facility]   No chief complaint on file.   History of Present Illness:    Donald [name] is a 79 y.o. male  past medical history of Long-standing diabetes, HBA1C 7.7 hyperlipidemia,  coronary artery disease,  Prior cardiac catheterization 2012 with 40% coronary disease noted Diffuse three-vessel coronary calcification on CT scan Cath 10/22: nonobstructive PAD,   chronic kidney disease, hypercalcemia,   OSA on CPAP,  Surgery of his parathyroid  Chronic back pain Who presents for follow-up of his coronary artery disease, Paroxysmal afib, aortic valve stenosis, syncope October 2022  Last seen in clinic by myself 6/24   In follow-up today reports feeling relatively well Activity limited secondary to arthritis, chronic back and hip pain  Lab work reviewed A1c 7.4 Total chol 105, LDL 48 Creatinine 1.88 in December 2023, high end of his range On Lasix  80 daily  On ozempic  2 months, has suppressed his appetite, weight down 5 pounds  On prior office visits reports he could not afford Eliquis [employer] did not cover Doreen  Denies significant abdominal distention, leg swelling  No regular exercise program, chronic knee pain, walks with a cane  EKG personally reviewed by myself on todays visit Normal sinus rhythm rate 60 bpm no significant ST-T wave changes  Other past medical history reviewed Prior hospitalization October 2022 for, Syncope , found on his garage floor Low potassium 2.9 , low sugar also noted to be in atrial fibrillation  Cardiac catheterization in October 2022 Mild, non-obstructive coronary artery disease, as detailed below.  No significant stenosis identified to  explain syncope or cardiomyopathy. Normal left ventricular filling pressure. Mild-moderate aortic valve stenosis based on peak-to-peak gradient (~20 mmHg).  Echo showed EF of 45 to 50% with global hypokinesis, grade III diastolic dysfunction.   Carotid ultrasound less than 50% stenosis bilaterally.  MRI with no acute intracranial findings  Other past medical history reviewed Abnormal EKG leading to stress testing on prior clinic visit This showed no significant ischemia   Had parathyroid , surgery Now with vertigo Following surgery Mild improvement with meclizine, though still symptomatic    previous PET/CT imaging   mild to moderate calcified plaque in the carotid arteries right greater than left Mild to moderate plaque in the aortic arch Diffuse three-vessel coronary calcifications He does have mild to moderate plaquing in the branches off the mid to distal descending aorta  staghorn left renal calculus   Recent parathyroid  spect scan  showed a focal abnormal sestamibi location suspicious for right inferior parathyroid  adenoma. Nonspecific tracer localization within single normal-sized left axillary and right subpectoral lymph node.    elective parathyroidectomy on 02/29/2016.    Past Medical History:  Diagnosis Date   (HFpEF) heart failure with preserved ejection fraction (HCC)    a. 09/2018 Echo: EF 60-65%. PASP . Mild-mod LAE. Mild MR/TR.   ANEMIA-IRON DEFICIENCY 07/26/2006   Anxiety    ASTHMA 07/26/2006   Asthma    Back pain, chronic    Bell's palsy    CKD (chronic kidney disease), stage III (HCC)    COLONIC POLYPS, HX OF 07/26/2006   COVID-19 virus infection 01/2020   DEPRESSION 03/14/2009   DIABETES MELLITUS, TYPE II 07/26/2006  DISC DISEASE, LUMBAR 10/05/2007   DVT 12/03/2007   DYSLIPIDEMIA 04/13/2009   Dyspnea    when gets up and walks around and back is hurting really bad -only Shortness of breath  then   GERD 07/26/2006   Gout    Heart murmur     History of kidney stones    HYPERTENSION 07/26/2006   INSOMNIA 08/21/2007   Neuropathy    Nonobstructive CAD (coronary artery disease)    a. 2012 Cath: no high grade stenosis; b. 2018 MV: No ischemia. Attenuation artifact.    OBSTRUCTIVE SLEEP APNEA 12/03/2007   Use C-PAP   PERIPHERAL NEUROPATHY 07/26/2006   Pernicious anemia 11/20/2006   PULMONARY EMBOLISM 10/05/2007   Sacral wound, subsequent encounter 01/06/2023   S/p OR I&D 01/08/2023, completed doxycycline  course for culture confirmed MRSA.      Syncope and collapse 10/30/2020   zio patch 11/2020: Predominant sinus bradycardia with an average rate of 54 betas per minute.  Type I second degree heart block was occasionally present w/o significant pauses. Short lived SVT x1 with occasional symptomatic PVCs, ventricular trigeminy present     Varicella zoster 01/29/2023   Vertigo    Past Surgical History:  Procedure Laterality Date   APPENDECTOMY  1968   BACK SURGERY  1977, 06/14/2008   Dr. Malcolm at [facility] (06/10)   CARDIAC CATHETERIZATION     CARDIOVERSION N/A 03/11/2023   Procedure: CARDIOVERSION;  Surgeon: Perla Evalene PARAS, MD;  Location: [facility] OR[facility];  Service: Cardiovascular;  Laterality: N/A;   CARDIOVERSION N/A 10/22/2023   Procedure: CARDIOVERSION;  Surgeon: Perla Evalene PARAS, MD;  Location: [facility];  Service: Cardiovascular;  Laterality: N/A;   CARPAL TUNNEL RELEASE Right 10/2017   CATARACT EXTRACTION W/PHACO Left 05/30/2014   Procedure: CATARACT EXTRACTION PHACO AND INTRAOCULAR LENS PLACEMENT (IOC);  Surgeon: Steven Dingeldein, MD;  Location: [facility];  Service: Ophthalmology;  Laterality: Left;  US  01:20 AP% 23.7 CDE 31.86   CATARACT EXTRACTION W/PHACO Right 09/06/2021   Procedure: CATARACT EXTRACTION PHACO AND INTRAOCULAR LENS PLACEMENT (IOC) RIGHT DIABETIC VISION BLUE OMIDRIA  MALYUGIN IRIS HOOKS;  Surgeon: Enola Feliciano Hugger, MD;  Location: [facility];  Service: Ophthalmology;  Laterality: Right;   14.65 2.11.1   COLONOSCOPY  10/2002   HP, SSA, TA, rpt 3 yrs ([facility])   COLONOSCOPY  01/2005   diverticulosis rpt 5 yrs ([facility]off)    EPIDURAL BLOCK INJECTION Bilateral 06/2021   bilat L5 nerve block with benefit ([facility])   INCISION AND DRAINAGE PERIRECTAL ABSCESS N/A 01/08/2023   Procedure: IRRIGATION AND DEBRIDEMENT SACRAL WOUND;  Surgeon: Marinda Jayson KIDD, MD;  Location: [facility];  Service: General;  Laterality: N/A;   IR NEPHROSTOMY PLACEMENT LEFT  10/19/2018   IR URETERAL STENT LEFT NEW ACCESS W/O SEP NEPHROSTOMY CATH  10/19/2018   LEFT HEART CATH AND CORONARY ANGIOGRAPHY N/A 11/01/2020   Procedure: LEFT HEART CATH AND CORONARY ANGIOGRAPHY;  Surgeon: Mady Bruckner, MD;  Location: [facility];  Service: Cardiovascular;  Laterality: N/A;   LITHOTRIPSY     X 2   NEPHROLITHOTOMY Left 10/19/2018   Procedure: NEPHROLITHOTOMY PERCUTANEOUS;  Surgeon: Matilda Senior, MD;  Location: [facility];  Service: Urology;  Laterality: Left;  3 HRS   PARATHYROIDECTOMY Right 04/18/2016   PARATHYROIDECTOMY for cyst Ole Hunter, MD)   SHOULDER ARTHROSCOPY W/ ROTATOR CUFF REPAIR Right    TONSILLECTOMY       No outpatient medications have been marked as taking for the 02/03/24 encounter (Appointment) with Aireana Ryland J, MD.  Allergies:   Codeine  and Pioglitazone   Social History   Tobacco Use   Smoking status: Never    Passive exposure: Never   Smokeless tobacco: Never  Vaping Use   Vaping status: Never Used  Substance Use Topics   Alcohol use: No   Drug use: No      Family Hx: The patient's family history includes Cancer in his mother and sister; Diabetes in his father and paternal grandfather; Heart disease in his father.  ROS:   Please see the history of present illness.    Review of Systems  Constitutional: Negative.   HENT: Negative.    Respiratory: Negative.    Cardiovascular: Negative.   Gastrointestinal: Negative.   Musculoskeletal:  Positive for joint  pain.  Neurological: Negative.   Psychiatric/Behavioral: Negative.    All other systems reviewed and are negative.    Labs/Other Tests and Data Reviewed:    Recent Labs: 03/28/2023: Magnesium 2.1 07/09/2023: TSH 3.11 01/05/2024: ALT 11; BUN 74; Creatinine, Ser 2.46; Hemoglobin 11.9; Platelets 159.0; Potassium 4.4; Sodium 144   Recent Lipid Panel Lab Results  Component Value Date/Time   CHOL 86 07/09/2023 04:05 PM   TRIG 132.0 07/09/2023 04:05 PM   HDL 27.00 (L) 07/09/2023 04:05 PM   CHOLHDL 3 07/09/2023 04:05 PM   LDLCALC 33 07/09/2023 04:05 PM   LDLDIRECT 52.2 05/25/2009 09:37 AM    Wt Readings from Last 3 Encounters:  02/02/24 239 lb 6.4 oz (108.6 kg)  01/05/24 239 lb 6.4 oz (108.6 kg)  11/05/23 227 lb 12.8 oz (103.3 kg)     Exam:    Vital Signs: Vital signs may also be detailed in the HPI There were no vitals taken for this visit.  Constitutional:  oriented to person, place, and time. No distress.  HENT:  Head: Grossly normal Eyes:  no discharge. No scleral icterus.  Neck: No JVD, no carotid bruits  Cardiovascular: Regular rate and rhythm, no murmurs appreciated Pulmonary/Chest: Clear to auscultation bilaterally, no wheezes or rails Abdominal: Soft.  no distension.  no tenderness.  Musculoskeletal: Normal range of motion Neurological:  normal muscle tone. Coordination normal. No atrophy Skin: Skin warm and dry Psychiatric: normal affect, pleasant  ASSESSMENT & PLAN:    Afib, paroxysmal Maintaining normal sinus rhythm Previously declined Eliquis  secondary to price Did not receive patient assistance for Eliquis  Discussed generic Pradaxa.  Currently not on anticoagulation per his choice  Essential hypertension -  Blood pressure is well controlled on today's visit. No changes made to the medications.  Dyslipidemia -  Cholesterol is at goal on the current lipid regimen. No changes to the medications were made.  Coronary artery disease involving native coronary  artery of native heart with angina pectoris (HCC) Significant three-vessel coronary calcification seen on  CT scan stress Myoview  with no significant ischemia cardiac catheterization, October 2022 with nonobstructive disease Non-smoker, cholesterol at goal, stressed importance of aggressive diabetes control Currently on Ozempic    Aortic atherosclerosis (HCC) Seen in the aortic arch and branches of the descending aorta Cholesterol at goal  Morbid obesity (HCC) We have encouraged continued  careful diet management in an effort to lose weight.  Type 2 diabetes mellitus without complication, without long-term current use of insulin  (HCC) Hemoglobin A1c of 7.4, followed by endocrine On Ozempic   Lower extremity edema Renal function 1.88 end of 2023 Has repeat lab work tomorrow Recommend he consider taking Lasix  5 days a week down from 7 Minimal ankle swelling  Shortness of breath Reports symptoms  stable, recommended weight loss, walking program   Total encounter time more than 40 minutes  Greater than 50% was spent in counseling and coordination of care with the patient   Signed, Evalene Lunger, MD  02/02/2024 6:57 PM    [facility] [address], [place], [place] [address]     "

## 2024-02-02 NOTE — Progress Notes (Signed)
 "  Chief Complaint  Patient presents with   Foot Pain    Pt is here due to left foot pain, states the pain level is at a 10, states he has been using lidocaine  patches and cream that helps some.    HPI: 79 y.o. male presenting today for follow-up evaluation of left foot pain.  Injections in the past have helped significantly according the patient.  Pain has slowly returned over the last few months  Past Medical History:  Diagnosis Date   (HFpEF) heart failure with preserved ejection fraction (HCC)    a. 09/2018 Echo: EF 60-65%. PASP . Mild-mod LAE. Mild MR/TR.   ANEMIA-IRON DEFICIENCY 07/26/2006   Anxiety    ASTHMA 07/26/2006   Asthma    Back pain, chronic    Bell's palsy    CKD (chronic kidney disease), stage III (HCC)    COLONIC POLYPS, HX OF 07/26/2006   COVID-19 virus infection 01/2020   DEPRESSION 03/14/2009   DIABETES MELLITUS, TYPE II 07/26/2006   DISC DISEASE, LUMBAR 10/05/2007   DVT 12/03/2007   DYSLIPIDEMIA 04/13/2009   Dyspnea    when gets up and walks around and back is hurting really bad -only Shortness of breath  then   GERD 07/26/2006   Gout    Heart murmur    History of kidney stones    HYPERTENSION 07/26/2006   INSOMNIA 08/21/2007   Neuropathy    Nonobstructive CAD (coronary artery disease)    a. 2012 Cath: no high grade stenosis; b. 2018 MV: No ischemia. Attenuation artifact.    OBSTRUCTIVE SLEEP APNEA 12/03/2007   Use C-PAP   PERIPHERAL NEUROPATHY 07/26/2006   Pernicious anemia 11/20/2006   PULMONARY EMBOLISM 10/05/2007   Sacral wound, subsequent encounter 01/06/2023   S/p OR I&D 01/08/2023, completed doxycycline  course for culture confirmed MRSA.      Syncope and collapse 10/30/2020   zio patch 11/2020: Predominant sinus bradycardia with an average rate of 54 betas per minute.  Type I second degree heart block was occasionally present w/o significant pauses. Short lived SVT x1 with occasional symptomatic PVCs, ventricular trigeminy present      Varicella zoster 01/29/2023   Vertigo      Physical Exam: General: The patient is alert and oriented x3 in no acute distress.  Dermatology: Skin is warm, dry and supple bilateral lower extremities. Negative for open lesions or macerations.  Vascular: Palpable pedal pulses bilaterally.  Chronic edema noted.  Capillary refill within normal limits.  Neurological: Gross intact via light touch  Musculoskeletal Exam: Tenderness with palpation noted along the lateral column of the left foot around the fifth TMT  Radiographic Exam LT foot 02/02/2024:  Normal osseous mineralization.  There are some joint space narrowing and articular degenerative changes of the fifth metatarsal cuboid joint.  There is a cortical irregularity along the joint.  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  It appears well-circumscribed and chronic in nature.  This does not correlate to the patient's symptoms.  No pathologic fracture identified.  There may be some chronic old pathologic fractures in the past that have healed.  Assessment: 1.  Left lateral column foot pain 2.  Cavus foot type left  Plan of Care:  -Patient evaluated -Injection of 0.5 cc Celestone  Soluspan injected along the peroneal tendon sheath left -Prescription for Medrol  Dosepak -History of CKD.  No NSAIDs.  Recommend exercise Tylenol  as needed - Cam boot dispensed.  WBAT x 2-3 weeks -  Compression ankle sleeve applied as well.  Wear daily -Return to clinic 1 month      Thresa EMERSON Sar, DPM [facility]  Dr. Thresa EMERSON Sar, DPM    [address] [place], [place] [address]                Office [phone]  Fax [phone]        "

## 2024-02-03 ENCOUNTER — Ambulatory Visit: Admitting: Cardiovascular Disease

## 2024-02-03 DIAGNOSIS — I420 Dilated cardiomyopathy: Secondary | ICD-10-CM

## 2024-02-03 DIAGNOSIS — I25118 Atherosclerotic heart disease of native coronary artery with other forms of angina pectoris: Secondary | ICD-10-CM

## 2024-02-03 DIAGNOSIS — I6523 Occlusion and stenosis of bilateral carotid arteries: Secondary | ICD-10-CM

## 2024-02-03 DIAGNOSIS — I739 Peripheral vascular disease, unspecified: Secondary | ICD-10-CM

## 2024-02-03 DIAGNOSIS — I5032 Chronic diastolic (congestive) heart failure: Secondary | ICD-10-CM

## 2024-02-03 DIAGNOSIS — I350 Nonrheumatic aortic (valve) stenosis: Secondary | ICD-10-CM

## 2024-02-03 DIAGNOSIS — I480 Paroxysmal atrial fibrillation: Secondary | ICD-10-CM

## 2024-02-03 DIAGNOSIS — I4892 Unspecified atrial flutter: Secondary | ICD-10-CM

## 2024-02-03 DIAGNOSIS — I10 Essential (primary) hypertension: Secondary | ICD-10-CM

## 2024-02-05 ENCOUNTER — Ambulatory Visit: Admitting: Cardiology

## 2024-02-06 ENCOUNTER — Telehealth: Payer: Self-pay

## 2024-02-06 NOTE — Telephone Encounter (Signed)
 Call pt schedule televisit for 1/29 @ 2.40. Med,Rec.consent done.   Patient Consent for Virtual Visit         Donald [name] has provided verbal consent on 02/06/2024 for a virtual visit (video or telephone).   CONSENT FOR VIRTUAL VISIT FOR:  Donald [name]  By participating in this virtual visit I agree to the following:  I hereby voluntarily request, consent and authorize [facility] and its employed or contracted physicians, physician assistants, nurse practitioners or other licensed health care professionals (the Practitioner), to provide me with telemedicine health care services (the Services) as deemed necessary by the treating Practitioner. I acknowledge and consent to receive the Services by the Practitioner via telemedicine. I understand that the telemedicine visit will involve communicating with the Practitioner through live audiovisual communication technology and the disclosure of certain medical information by electronic transmission. I acknowledge that I have been given the opportunity to request an in-person assessment or other available alternative prior to the telemedicine visit and am voluntarily participating in the telemedicine visit.  I understand that I have the right to withhold or withdraw my consent to the use of telemedicine in the course of my care at any time, without affecting my right to future care or treatment, and that the Practitioner or I may terminate the telemedicine visit at any time. I understand that I have the right to inspect all information obtained and/or recorded in the course of the telemedicine visit and may receive copies of available information for a reasonable fee.  I understand that some of the potential risks of receiving the Services via telemedicine include:  Delay or interruption in medical evaluation due to technological equipment failure or disruption; Information transmitted may not be sufficient (e.g. poor resolution of  images) to allow for appropriate medical decision making by the Practitioner; and/or  In rare instances, security protocols could fail, causing a breach of personal health information.  Furthermore, I acknowledge that it is my responsibility to provide information about my medical history, conditions and care that is complete and accurate to the best of my ability. I acknowledge that Practitioner's advice, recommendations, and/or decision may be based on factors not within their control, such as incomplete or inaccurate data provided by me or distortions of diagnostic images or specimens that may result from electronic transmissions. I understand that the practice of medicine is not an exact science and that Practitioner makes no warranties or guarantees regarding treatment outcomes. I acknowledge that a copy of this consent can be made available to me via my patient portal [facility] MyChart), or I can request a printed copy by calling the office of [facility].    I understand that my insurance will be billed for this visit.   I have read or had this consent read to me. I understand the contents of this consent, which adequately explains the benefits and risks of the Services being provided via telemedicine.  I have been provided ample opportunity to ask questions regarding this consent and the Services and have had my questions answered to my satisfaction. I give my informed consent for the services to be provided through the use of telemedicine in my medical care

## 2024-02-06 NOTE — Telephone Encounter (Signed)
" ° °  Name: Donald [name]  DOB: [birth year]  MRN: [id number]  Primary Cardiologist: Timothy Gollan, MD  Patient did not present for his appointment with Dr. Gollan. Last seen in clinic on 11/05/23  Preoperative team, please contact this patient and set up a phone call appointment for further preoperative risk assessment. Please obtain consent and complete medication review. Thank you for your help.  I confirm that guidance regarding antiplatelet and oral anticoagulation therapy has been completed and, if necessary, noted below.  I also confirmed the patient resides in the state of [place] . As per [facility] Medical Board telemedicine laws, the patient must reside in the state in which the provider is licensed.   Rollo FABIENE Louder, PA-C 02/06/2024, 3:21 PM [facility]    "

## 2024-02-06 NOTE — Telephone Encounter (Signed)
 Call pt schedule televisit for 1/29 @ 2.40. Med,Rec.consent done.

## 2024-02-10 ENCOUNTER — Ambulatory Visit: Admitting: Medical

## 2024-02-10 NOTE — Telephone Encounter (Signed)
 Pt has tele visit 02/12/24. Note will be removed from preop pool. Glendia Ferrier, PA-C    02/10/2024 4:02 PM

## 2024-02-11 ENCOUNTER — Telehealth: Payer: Self-pay | Admitting: Cardiovascular Disease

## 2024-02-11 ENCOUNTER — Ambulatory Visit: Admitting: Cardiology

## 2024-02-11 NOTE — Progress Notes (Unsigned)
 Patient reports that he has appointment with EP on Monday.  He wishes to have preoperative cardiac evaluation done at that time.  I will no charge for call.  Josefa HERO. Hogan Hoobler NP-C     02/12/2024, 2:37 PM [facility] [address] [place], [place] [address] Office [phone]

## 2024-02-11 NOTE — Progress Notes (Unsigned)
 "     Electrophysiology Clinic Note    Date:  02/11/2024  Patient ID:  Donald [name], Donald [name] 07/15/1945, MRN [id number] PCP:  Rilla Baller, MD  Cardiologist:  Evalene Lunger, MD   Electrophysiologist:  Fonda Kitty, MD  Electrophysiology APP:  Nateisha Moyd, NP     Discussed the use of AI scribe software for clinical note transcription with the patient, who gave verbal consent to proceed.   Patient Profile    Chief Complaint: AFib, aflutter   History of Present Illness: Donald [name] is a 79 y.o. male with PMH notable for persis AFib, aflutter, bradycardia, HFimpEF, non-obs CAD, OSA on CPAP, HTN, T2DM (poorly controlled), CKD-3, sacral wound; seen today for Fonda Kitty, MD for acute EP appointment.   He last saw Dr. Kitty 04/2023 for initial EP evaluation of AFib, flutter. His AFib was first diagnosed 12/2022 when he was in hospital for sacral wound 2/2 MRSA. Multaq  was started in Sept 2025 and he is s/p DCCV thereafter.  I last saw him 10/2023 where he was maintaining sinus rhythm without improvement in fatigue.   He called clinic earlier today stating that he was having more AFib   *** AF burden, symptoms *** palpitations *** bleeding concerns   - amio?     Arrhythmia/Device History No specialty comments available.    ROS:  Please see the history of present illness. All other systems are reviewed and otherwise negative.    Physical Exam    VS:  There were no vitals taken for this visit. BMI: There is no height or weight on file to calculate BMI.      Wt Readings from Last 3 Encounters:  02/02/24 239 lb 6.4 oz (108.6 kg)  01/05/24 239 lb 6.4 oz (108.6 kg)  11/05/23 227 lb 12.8 oz (103.3 kg)     GEN- The patient is well appearing, alert and oriented x 3 today.   Lungs- Clear to ausculation bilaterally, normal work of breathing.  Heart- Regular rate and rhythm, no murmurs, rubs or gallops Extremities- Trace peripheral edema, warm,  dry   Studies Reviewed   Previous EP, cardiology notes.    EKG is ordered. Personal review of EKG from today shows:         TTE, 04/29/2023 1. Left ventricular ejection fraction, by estimation, is 60 to 65%. The left ventricle has normal function. The left ventricle has no regional wall motion abnormalities. There is mild left ventricular hypertrophy. Left ventricular diastolic parameters are indeterminate.   2. Right ventricular systolic function is normal. The right ventricular size is normal. Tricuspid regurgitation signal is inadequate for assessing PA pressure.   3. Left atrial size was moderately dilated.   4. The mitral valve is normal in structure. Mild mitral valve regurgitation. No evidence of mitral stenosis.   5. The aortic valve is normal in structure. There is mild calcification of the aortic valve. Aortic valve regurgitation is not visualized. Aortic valve sclerosis/calcification is present, without any evidence of aortic stenosis.   6. The inferior vena cava is normal in size with greater than 50% respiratory variability, suggesting right atrial pressure of 3 mmHg.   Long term monitor, 04/28/2023 Atrial Flutter occurred continuously (100% burden), ranging from 28-126 bpm (avg of 52 bpm).   88 Pauses occurred, the longest lasting 5.1 secs (12 bpm).   1 run of Ventricular Tachycardia occurred lasting 4 beats with a max rate of 110 bpm (avg 101 bpm).   Isolated VEs were occasional (1.2%,  89186), VE Couplets were rare (<1.0%, 688), and no VE Triplets were present.  Ventricular Bigeminy and Trigeminy were present.   Difficulty discerning atrial activity making definitive diagnosis difficult to ascertain.  No patient triggered events recorded  LHC, 11/01/2020 Mild, non-obstructive coronary artery disease, as detailed below.  No significant stenosis identified to explain syncope or cardiomyopathy. Normal left ventricular filling pressure. Mild-moderate aortic valve stenosis  based on peak-to-peak gradient (~20 mmHg).  TTE, 10/31/2020  1. Left ventricular ejection fraction, by estimation, is 45 to 50%. The left ventricle has mildly decreased function. The left ventricle demonstrates global hypokinesis. There is moderate left ventricular hypertrophy. Left ventricular diastolic parameters are consistent with Grade III diastolic dysfunction  (restrictive). Elevated left atrial pressure.   2. Right ventricular systolic function is normal. The right ventricular size is normal.   3. Left atrial size was mildly dilated.   4. The mitral valve is degenerative. Mild mitral valve regurgitation. No evidence of mitral stenosis.   5. The aortic valve has an indeterminant number of cusps. There is mild calcification of the aortic valve. There is moderate thickening of the aortic valve. Aortic valve regurgitation is trivial. Mild to moderate aortic valve sclerosis/calcification is present, without any evidence of aortic stenosis.   Assessment and Plan   #) persis AFib #) aflutter #) bradycardia Recently started on multaq  S/p DCCV 10/8 and maintaining sinus rhythm No cardiac awareness Continue to monitor rhythm with BP machine Continue 400mg  multaq  BID  #) Hypercoag d/t long standing persis afib CHA2DS2-VASc Score = at least 5 [CHF History: 1, HTN History: 1, Diabetes History: 1, Stroke History: 0, Vascular Disease History: 0, Age Score: 2, Gender Score: 0].  Therefore, the patient's annual risk of stroke is 7.2 %.    Stroke ppx - 5mg  eliquis  BID, appropriately dosed No bleeding concerns   #) HTN Well controlled in office today Continue to check readings at home  #) OSA Continue nightly CPAP           Current medicines are reviewed at length with the patient today.   The patient does not have concerns regarding his medicines.  The following changes were made today:   none  Labs/ tests ordered today include:  No orders of the defined types were placed in this  encounter.    Disposition: Follow up with Dr. Kennyth or EP APP in 6 months  Follow-up with general cardiology - Dr Perla or APP in 3 months as previously recommended   Signed, Matis Monnier, NP  02/11/24  9:21 AM  Electrophysiology [facility] "

## 2024-02-11 NOTE — Telephone Encounter (Signed)
 Patient c/o Palpitations: STAT if patient c/o lightheadedness, shortness of breath, or chest pain  How long have you had palpitations/irregular HR/ Afib? Are you having the symptoms now?   Noticed it yesterday.  Are you currently experiencing lightheadedness, SOB or CP? No   Do you have a history of afib (atrial fibrillation) or irregular heart rhythm? Yes   Have you checked your BP or HR? (document readings if available):   146/59 155/65   Are you experiencing any other symptoms?  Weak    Pt goes in and out of Afib. Pt canceled surgery so needs to cancel pre op appt as well.

## 2024-02-11 NOTE — Telephone Encounter (Signed)
 Patient called to cancel his procedure due to his heart issues. He had seen his cardiologist and had EMS come to his house yesterday night because he keeps getting AFib. He stated that he would be calling us  back once he is able to solve this issue and when his cardiologist gives him cardiac clearance. I then called Trish to let her know to cancel for now.

## 2024-02-11 NOTE — Telephone Encounter (Signed)
 Called patient to review symptoms - extreme fatigue and in/out of a fib, son (EMS) did EKG in the home last night - showed a fib  Scheduled for Riddle today at 2:45

## 2024-02-12 ENCOUNTER — Telehealth: Payer: Self-pay | Admitting: Cardiovascular Disease

## 2024-02-12 ENCOUNTER — Ambulatory Visit: Attending: Cardiovascular Disease

## 2024-02-12 DIAGNOSIS — Z01810 Encounter for preprocedural cardiovascular examination: Secondary | ICD-10-CM

## 2024-02-12 NOTE — Telephone Encounter (Signed)
 Patient c/o Palpitations: STAT if patient c/o lightheadedness, shortness of breath, or chest pain  How long have you had palpitations/irregular HR/ Afib? Are you having the symptoms now? Since yesterday   Are you currently experiencing lightheadedness, SOB or CP? No   Do you have a history of afib (atrial fibrillation) or irregular heart rhythm? Yes   Have you checked your BP or HR? (document readings if available): No   Are you experiencing any other symptoms? No

## 2024-02-12 NOTE — Telephone Encounter (Signed)
 Called patient for further information and to get him into our clinic or AF clinic in [place] - patient will keep 2/2 appointment with Suzann Riddle - reviewed ER precautions for the weekend as winter weather is expected again - pt verbalized understanding - prefers to keep 2/2 appointment

## 2024-02-16 ENCOUNTER — Ambulatory Visit: Admitting: Cardiology

## 2024-02-16 ENCOUNTER — Encounter: Admission: RE | Payer: Self-pay | Source: Home / Self Care

## 2024-02-16 ENCOUNTER — Ambulatory Visit: Admit: 2024-02-16

## 2024-02-17 ENCOUNTER — Ambulatory Visit: Admitting: Cardiovascular Disease

## 2024-02-17 ENCOUNTER — Ambulatory Visit

## 2024-02-17 ENCOUNTER — Encounter: Payer: Self-pay | Admitting: Cardiovascular Disease

## 2024-02-17 VITALS — BP 100/52 | HR 66 | Ht 66.0 in | Wt 238.5 lb

## 2024-02-17 DIAGNOSIS — I5032 Chronic diastolic (congestive) heart failure: Secondary | ICD-10-CM

## 2024-02-17 DIAGNOSIS — I480 Paroxysmal atrial fibrillation: Secondary | ICD-10-CM

## 2024-02-17 DIAGNOSIS — I10 Essential (primary) hypertension: Secondary | ICD-10-CM | POA: Diagnosis not present

## 2024-02-17 DIAGNOSIS — I420 Dilated cardiomyopathy: Secondary | ICD-10-CM

## 2024-02-17 DIAGNOSIS — I25118 Atherosclerotic heart disease of native coronary artery with other forms of angina pectoris: Secondary | ICD-10-CM | POA: Diagnosis not present

## 2024-02-17 DIAGNOSIS — I6523 Occlusion and stenosis of bilateral carotid arteries: Secondary | ICD-10-CM | POA: Diagnosis not present

## 2024-02-17 DIAGNOSIS — I739 Peripheral vascular disease, unspecified: Secondary | ICD-10-CM | POA: Diagnosis not present

## 2024-02-17 DIAGNOSIS — I4892 Unspecified atrial flutter: Secondary | ICD-10-CM

## 2024-02-17 DIAGNOSIS — I350 Nonrheumatic aortic (valve) stenosis: Secondary | ICD-10-CM

## 2024-02-17 MED ORDER — TORSEMIDE 20 MG PO TABS: 40.0000 mg | ORAL_TABLET | Freq: Every day | ORAL | 3 refills | Status: AC

## 2024-02-17 NOTE — Patient Instructions (Addendum)
 F/u appt with suzann for afib  Research COST PLUS Pradaxa (generic - twice a day)  Medication Instructions:  Hold the lasix   Take torsemide  40 mg daily  If you need a refill on your cardiac medications before your next appointment, please call your pharmacy.   Lab work: No new labs needed  Testing/Procedures:  Your physician has recommended that you wear a Zio monitor.   This monitor is a medical device that records the hearts electrical activity. Doctors most often use these monitors to diagnose arrhythmias. Arrhythmias are problems with the speed or rhythm of the heartbeat. The monitor is a small device applied to your chest. You can wear one while you do your normal daily activities. While wearing this monitor if you have any symptoms to push the button and record what you felt. Once you have worn this monitor for the period of time provider prescribed (Usually 14 days), you will return the monitor device in the postage paid box. Once it is returned they will download the data collected and provide us  with a report which the provider will then review and we will call you with those results. Important tips:  Avoid showering during the first 24 hours of wearing the monitor. Avoid excessive sweating to help maximize wear time. Do not submerge the device, no hot tubs, and no swimming pools. Keep any lotions or oils away from the patch. After 24 hours you may shower with the patch on. Take brief showers with your back facing the shower head.  Do not remove patch once it has been placed because that will interrupt data and decrease adhesive wear time. Push the button when you have any symptoms and write down what you were feeling. Once you have completed wearing your monitor, remove and place into box which has postage paid and place in your outgoing mailbox.  If for some reason you have misplaced your box then call our office and we can provide another box and/or mail it off for you.    Follow-Up: At [facility], you and your health needs are our priority.  As part of our continuing mission to provide you with exceptional heart care, we have created designated Provider Care Teams.  These Care Teams include your primary Cardiologist (physician) and Advanced Practice Providers (APPs -  Physician Assistants and Nurse Practitioners) who all work together to provide you with the care you need, when you need it.  You will need a follow up appointment in 6 months  Providers on your designated Care Team:   Lonni Meager, NP Bernardino Bring, PA-C Cadence [place], [place]  COVID-19 Vaccine Information can be found at: [email or web address] For questions related to vaccine distribution or appointments, please email [email or web address] or call [phone].

## 2024-02-18 NOTE — Telephone Encounter (Signed)
 I left a voicemail for Mr. Janet to let him know that we have had a cancellation with Suzann Riddle, NP at 10:40 am on 02/18/2024. Patient asked to call or My Chart if he would like the appointment.

## 2024-02-19 ENCOUNTER — Other Ambulatory Visit: Payer: Self-pay | Admitting: Emergency Medicine

## 2024-02-19 DIAGNOSIS — Z79899 Other long term (current) drug therapy: Secondary | ICD-10-CM

## 2024-02-20 ENCOUNTER — Telehealth: Admitting: Student

## 2024-02-20 ENCOUNTER — Ambulatory Visit (HOSPITAL_COMMUNITY)

## 2024-02-20 ENCOUNTER — Encounter (HOSPITAL_COMMUNITY): Payer: Self-pay | Admitting: *Deleted

## 2024-02-20 ENCOUNTER — Telehealth: Payer: Self-pay

## 2024-02-20 ENCOUNTER — Ambulatory Visit (HOSPITAL_COMMUNITY)
Admission: EM | Admit: 2024-02-20 | Discharge: 2024-02-20 | Disposition: A | Discharge status: Home / Self Care | Source: Home / Self Care

## 2024-02-20 DIAGNOSIS — M549 Dorsalgia, unspecified: Secondary | ICD-10-CM

## 2024-02-20 DIAGNOSIS — M5432 Sciatica, left side: Secondary | ICD-10-CM

## 2024-02-20 DIAGNOSIS — W19XXXA Unspecified fall, initial encounter: Secondary | ICD-10-CM

## 2024-02-20 MED ORDER — BACLOFEN 5 MG PO TABS: 5.0000 mg | ORAL_TABLET | Freq: Three times a day (TID) | ORAL | 0 refills | Status: AC | PRN

## 2024-02-20 MED ORDER — DEXAMETHASONE SOD PHOSPHATE PF 10 MG/ML IJ SOLN
INTRAMUSCULAR | Status: AC
  Filled 2024-02-20: qty 1

## 2024-02-20 MED ORDER — DEXAMETHASONE SOD PHOSPHATE PF 10 MG/ML IJ SOLN
10.0000 mg | Freq: Once | INTRAMUSCULAR | Status: AC
  Administered 2024-02-20: 10 mg via INTRAMUSCULAR

## 2024-02-20 NOTE — Progress Notes (Signed)
" °  Because of severe 10/10 back pain that is related to an injury, I feel your condition warrants further evaluation and I recommend that you be seen in a face-to-face visit. You may require imaging and pain medication, which we are unable to provide via telehealth.   NOTE: There will be NO CHARGE for this E-Visit   If you are having a true medical emergency, please call 911.     For an urgent face to face visit, [facility] has multiple urgent care centers for your convenience.  Click the link below for the full list of locations and hours, walk-in wait times, appointment scheduling options and driving directions:  Urgent Care - [place], [place], [place], [place], [place], [place]  [place]     Your MyChart E-visit questionnaire answers were reviewed by a board certified advanced clinical practitioner to complete your personal care plan based on your specific symptoms.    Thank you for using e-Visits.    "

## 2024-02-20 NOTE — Telephone Encounter (Signed)
 Noted seen at [facility] with lumbar spine xray done.  He also has ortho appt scheduled for next week.

## 2024-02-20 NOTE — Telephone Encounter (Signed)
 Copied from CRM [id number]. Topic: Clinical - Request for Lab/Test Order >> Feb 20, 2024  2:01 PM Sasha M wrote: Reason for CRM: Jasmine from [facility] called because pt had a fall yesterday and is there for x-rays but she states that the x-rays are not what he is complaining of and wanted to know if Dr KANDICE would be willing to approve orders for left lef and hip x-ray. I called [facility] and was told that Dr KANDICE would not approve this since he was not the one to assess the patient and would need an appt first. I relayed this to Jasmine and instructed her to tell pt to call us  to schedule an appt with Dr KANDICE

## 2024-02-20 NOTE — Discharge Instructions (Addendum)
" °  1. Fall, initial encounter (Primary) - DG Lumbar Spine Complete x-ray performed in UC shows no acute fracture or dislocation, no acute musculoskeletal abnormality.  2. Bilateral sciatica - dexamethasone  (DECADRON ) injection 10 mg given in UC for acute lumbar inflammation and sciatica. - Baclofen  5 MG TABS; Take 1 tablet (5 mg total) by mouth 3 (three) times daily as needed.  Dispense: 30 tablet; Refill: 0  -Continue to monitor symptoms for any change in severity if there is any escalation of current symptoms or development of new symptoms follow-up in ER for further evaluation and management. "

## 2024-02-20 NOTE — ED Triage Notes (Signed)
 Pt states he has a neurosurgery appt next week, he had an MRI 21 weeks ago.   Last night he fell backwards on his hips, which now have pain radiating down both legs.  He states he hit his head but denies any pain. Pt is on Eliquis  BID

## 2024-02-20 NOTE — ED Provider Notes (Signed)
 " [facility]  Note:  This document was prepared using Dragon voice recognition software and may include unintentional dictation errors.  MRN: [id number] DOB: Feb 28, 1945  Subjective:   Donald [name] is a 79 y.o. male presenting for evaluation of lower back pain with radiation to both legs after a fall that occurred last night.  Patient reports he was trying to put his walker in the back of his SUV when he lost his balance and fell backwards into the street landing on his buttocks.  Patient reports he did hit his head but had no loss of consciousness or altered mental status.  Denies any headache or hematoma to posterior head.  Patient is on Eliquis  which concerned patient due to hitting his head and hard fall.  Patient has follow-up appointment with neurosurgery next week as scheduled but wanted to be evaluated earlier than that to make sure that everything was okay.  Patient currently takes hydrocodone  for pain but states that pain has not been well-managed since fall with prescribed medication.  Patient denies any other medical concerns or pain at this time.  Current Medications[1]   Allergies[2]  Past Medical History:  Diagnosis Date   (HFpEF) heart failure with preserved ejection fraction (HCC)    a. 09/2018 Echo: EF 60-65%. PASP . Mild-mod LAE. Mild MR/TR.   ANEMIA-IRON DEFICIENCY 07/26/2006   Anxiety    ASTHMA 07/26/2006   Asthma    Back pain, chronic    Bell's palsy    CKD (chronic kidney disease), stage III (HCC)    COLONIC POLYPS, HX OF 07/26/2006   COVID-19 virus infection 01/2020   DEPRESSION 03/14/2009   DIABETES MELLITUS, TYPE II 07/26/2006   DISC DISEASE, LUMBAR 10/05/2007   DVT 12/03/2007   DYSLIPIDEMIA 04/13/2009   Dyspnea    when gets up and walks around and back is hurting really bad -only Shortness of breath  then   GERD 07/26/2006   Gout    Heart murmur    History of kidney stones    HYPERTENSION 07/26/2006   INSOMNIA 08/21/2007    Neuropathy    Nonobstructive CAD (coronary artery disease)    a. 2012 Cath: no high grade stenosis; b. 2018 MV: No ischemia. Attenuation artifact.    OBSTRUCTIVE SLEEP APNEA 12/03/2007   Use C-PAP   PERIPHERAL NEUROPATHY 07/26/2006   Pernicious anemia 11/20/2006   PULMONARY EMBOLISM 10/05/2007   Sacral wound, subsequent encounter 01/06/2023   S/p OR I&D 01/08/2023, completed doxycycline  course for culture confirmed MRSA.      Syncope and collapse 10/30/2020   zio patch 11/2020: Predominant sinus bradycardia with an average rate of 54 betas per minute.  Type I second degree heart block was occasionally present w/o significant pauses. Short lived SVT x1 with occasional symptomatic PVCs, ventricular trigeminy present     Varicella zoster 01/29/2023   Vertigo      Past Surgical History:  Procedure Laterality Date   APPENDECTOMY  1968   BACK SURGERY  1977, 06/14/2008   Dr. Malcolm at [facility] (06/10)   CARDIAC CATHETERIZATION     CARDIOVERSION N/A 03/11/2023   Procedure: CARDIOVERSION;  Surgeon: Perla Evalene PARAS, MD;  Location: [facility];  Service: Cardiovascular;  Laterality: N/A;   CARDIOVERSION N/A 10/22/2023   Procedure: CARDIOVERSION;  Surgeon: Perla Evalene PARAS, MD;  Location: [facility];  Service: Cardiovascular;  Laterality: N/A;   CARPAL TUNNEL RELEASE Right 10/2017   CATARACT EXTRACTION W/PHACO Left 05/30/2014   Procedure: CATARACT EXTRACTION PHACO AND INTRAOCULAR  LENS PLACEMENT (IOC);  Surgeon: Steven Dingeldein, MD;  Location: [facility];  Service: Ophthalmology;  Laterality: Left;  US  01:20 AP% 23.7 CDE 31.86   CATARACT EXTRACTION W/PHACO Right 09/06/2021   Procedure: CATARACT EXTRACTION PHACO AND INTRAOCULAR LENS PLACEMENT (IOC) RIGHT DIABETIC VISION BLUE OMIDRIA  MALYUGIN IRIS HOOKS;  Surgeon: Enola Feliciano Hugger, MD;  Location: [facility];  Service: Ophthalmology;  Laterality: Right;  14.65 2.11.1   COLONOSCOPY  10/2002   HP, SSA, TA, rpt 3 yrs ([facility])    COLONOSCOPY  01/2005   diverticulosis rpt 5 yrs ([facility])    EPIDURAL BLOCK INJECTION Bilateral 06/2021   bilat L5 nerve block with benefit ([facility])   INCISION AND DRAINAGE PERIRECTAL ABSCESS N/A 01/08/2023   Procedure: IRRIGATION AND DEBRIDEMENT SACRAL WOUND;  Surgeon: Marinda Jayson KIDD, MD;  Location: [facility];  Service: General;  Laterality: N/A;   IR NEPHROSTOMY PLACEMENT LEFT  10/19/2018   IR URETERAL STENT LEFT NEW ACCESS W/O SEP NEPHROSTOMY CATH  10/19/2018   LEFT HEART CATH AND CORONARY ANGIOGRAPHY N/A 11/01/2020   Procedure: LEFT HEART CATH AND CORONARY ANGIOGRAPHY;  Surgeon: Mady Bruckner, MD;  Location: [facility];  Service: Cardiovascular;  Laterality: N/A;   LITHOTRIPSY     X 2   NEPHROLITHOTOMY Left 10/19/2018   Procedure: NEPHROLITHOTOMY PERCUTANEOUS;  Surgeon: Matilda Senior, MD;  Location: [facility];  Service: Urology;  Laterality: Left;  3 HRS   PARATHYROIDECTOMY Right 04/18/2016   PARATHYROIDECTOMY for cyst Ole Hunter, MD)   SHOULDER ARTHROSCOPY W/ ROTATOR CUFF REPAIR Right    TONSILLECTOMY      Family History  Problem Relation Age of Onset   Cancer Mother        Breast Cancer   Cancer Sister        Breast Cancer   Diabetes Father    Heart disease Father    Diabetes Paternal Grandfather     Social History[3]  ROS Refer to HPI for ROS details.  Objective:    Vitals: BP (!) 156/51 (BP Location: Left Arm)   Pulse 60   Temp (!) 97.4 F (36.3 C) (Oral)   Resp 18   SpO2 92%   Physical Exam Vitals and nursing note reviewed.  Constitutional:      General: He is not in acute distress.    Appearance: Normal appearance. He is not ill-appearing or toxic-appearing.  HENT:     Head: Normocephalic and atraumatic.  Cardiovascular:     Rate and Rhythm: Normal rate.  Pulmonary:     Effort: Pulmonary effort is normal. No respiratory distress.     Breath sounds: No stridor. No wheezing.  Musculoskeletal:     Lumbar back: Spasms, tenderness  and bony tenderness present. No swelling or deformity. Normal range of motion. Positive right straight leg raise test and positive left straight leg raise test.  Skin:    General: Skin is warm and dry.     Capillary Refill: Capillary refill takes less than 2 seconds.  Neurological:     General: No focal deficit present.     Mental Status: He is alert and oriented to person, place, and time.     Cranial Nerves: No cranial nerve deficit.     Sensory: No sensory deficit.     Motor: No weakness.     Coordination: Coordination normal.     Gait: Gait normal.  Psychiatric:        Mood and Affect: Mood normal.        Behavior:  Behavior normal.        Thought Content: Thought content normal.        Judgment: Judgment normal.     Procedures  No results found. However, due to the size of the patient record, not all encounters were searched. Please check Results Review for a complete set of results.  Assessment and Plan :     Discharge Instructions       1. Fall, initial encounter (Primary) - DG Lumbar Spine Complete x-ray performed in UC shows no acute fracture or dislocation, no acute musculoskeletal abnormality.  2. Bilateral sciatica - dexamethasone  (DECADRON ) injection 10 mg given in UC for acute lumbar inflammation and sciatica. - Baclofen  5 MG TABS; Take 1 tablet (5 mg total) by mouth 3 (three) times daily as needed.  Dispense: 30 tablet; Refill: 0  -Continue to monitor symptoms for any change in severity if there is any escalation of current symptoms or development of new symptoms follow-up in ER for further evaluation and management.      Bernardette Waldron B Kellye Mizner    [1] No current facility-administered medications for this encounter.  Current Outpatient Medications:    allopurinol  (ZYLOPRIM ) 300 MG tablet, Take 1 tablet (300 mg total) by mouth daily., Disp: 90 tablet, Rfl: 3   Baclofen  5 MG TABS, Take 1 tablet (5 mg total) by mouth 3 (three) times daily as needed., Disp: 30  tablet, Rfl: 0   Cholecalciferol (VITAMIN D3) 25 MCG (1000 UT) CAPS, Take 1 capsule (1,000 Units total) by mouth daily., Disp: 30 capsule, Rfl:    doxazosin  (CARDURA ) 1 MG tablet, TAKE ONE TABLET BY MOUTH TWICE DAILY, Disp: 180 tablet, Rfl: 3   dronedarone  (MULTAQ ) 400 MG tablet, Take 1 tablet (400 mg total) by mouth 2 (two) times daily with a meal., Disp: 180 tablet, Rfl: 3   ELIQUIS  5 MG TABS tablet, TAKE ONE (1) TABLET BY MOUTH TWO TIMES PER DAY, Disp: 60 tablet, Rfl: 5   gabapentin  (NEURONTIN ) 600 MG tablet, Take 2 tablets (1,200 mg total) by mouth at bedtime., Disp: 180 tablet, Rfl: 3   HYDROcodone -acetaminophen  (NORCO) 10-325 MG tablet, Take 1 tablet by mouth every 6 (six) hours as needed for moderate pain ((typically once to twice daily if needed)). , Disp: 30 tablet, Rfl: 0   insulin  NPH Human (HUMULIN  N) 100 UNIT/ML injection, Inject 1 mL (100 Units total) into the skin at bedtime., Disp: 100 mL, Rfl: 3   insulin  regular (NOVOLIN  R) 100 units/mL injection, INJECT 60 UNITS BEFORE BRUNCH AND SUPPER. ADD EXTRA UNITS ON INSULIN  TO YOUR MEAL IF YOUR BLOOD SUGARS ARE HIGHER THAN 155, Disp: , Rfl:    isosorbide  mononitrate (IMDUR ) 30 MG 24 hr tablet, Take 1 tablet (30 mg total) by mouth daily., Disp: 90 tablet, Rfl: 2   losartan  (COZAAR ) 25 MG tablet, TAKE 1 TABLET BY MOUTH AT BEDTIME, Disp: 90 tablet, Rfl: 2   lovastatin  (MEVACOR ) 40 MG tablet, TAKE TWO TABLETS BY MOUTH AT BEDTIME, Disp: 180 tablet, Rfl: 1   Naphazoline-Pheniramine (OPCON-A  OP), Place 1 drop into both eyes 3 (three) times daily as needed (itchy eyes.). , Disp: , Rfl:    Semaglutide , 2 MG/DOSE, 8 MG/3ML SOPN, Inject 2 mg as directed once a week., Disp: 9 mL, Rfl: 3   vitamin B-12 (CYANOCOBALAMIN ) 100 MCG tablet, Take 100 mcg by mouth daily., Disp: , Rfl:    albuterol  (VENTOLIN  HFA) 108 (90 Base) MCG/ACT inhaler, Inhale 2 puffs into the lungs every 6 (six) hours as  needed for wheezing or shortness of breath., Disp: 8 g, Rfl: 0    ALPRAZolam  (XANAX ) 1 MG tablet, , Disp: , Rfl:    colchicine  0.6 MG tablet, TAKE 1 TABLET BY MOUTH DAILY AS NEEDED FOR GOUT GLARE. ON FIRST DAY OF GOUT FLARE,MAY TAKE 2 TABLETS AT ONCE AS DIRECTED, Disp: 30 tablet, Rfl: 0   Continuous Glucose Sensor (FREESTYLE LIBRE 2 SENSOR) MISC, USE ONE SENSOR EVERY 14 DAYS, Disp: 6 each, Rfl: 3   cyanocobalamin  (VITAMIN B12) 1000 MCG/ML injection, INJECT 1ML INTRAMUSCULARLY EVERY 30 DAYS, Disp: 1 mL, Rfl: 6   glucose blood (GE100 BLOOD GLUCOSE TEST) test strip, TEST FOUR TIMES DAILY, Disp: 200 each, Rfl: 10   meclizine (ANTIVERT) 25 MG tablet, Take 25 mg by mouth 3 (three) times daily as needed for dizziness., Disp: , Rfl:    methylPREDNISolone  (MEDROL  DOSEPAK) 4 MG TBPK tablet, 6 day dose pack - take as directed (Patient not taking: No sig reported), Disp: 21 tablet, Rfl: 0   nitroGLYCERIN  (NITROSTAT ) 0.4 MG SL tablet, PLACE 1 TABLET UNDER TONGUE EVERY 5 MIN AS NEEDED FOR CHEST PAIN IF NO RELIEF IN15 MIN CALL 911 (MAX 3 TABS), Disp: 25 tablet, Rfl: 11   Sennosides-Docusate Sodium  (STOOL SOFTENER & LAXATIVE PO), Take 2 tablets by mouth at bedtime., Disp: , Rfl:    Syringe/Needle, Disp, (SYRINGE 3CC/22GX1-1/2) 22G X 1-1/2 3 ML MISC, Use to administer monthly b12 shots, Disp: 50 each, Rfl: 0   torsemide  (DEMADEX ) 20 MG tablet, Take 2 tablets (40 mg total) by mouth daily., Disp: 180 tablet, Rfl: 3 [2]  Allergies Allergen Reactions   Codeine  Other (See Comments)    REACTION: chest pain   Pioglitazone Other (See Comments)    REACTION to Actos: swelling in ankles  [3]  Social History Tobacco Use   Smoking status: Never    Passive exposure: Never   Smokeless tobacco: Never  Vaping Use   Vaping status: Never Used  Substance Use Topics   Alcohol use: No   Drug use: No     Alister Staver B, NP 02/20/24 1640  "

## 2024-03-02 ENCOUNTER — Ambulatory Visit: Admitting: Podiatry

## 2024-03-04 ENCOUNTER — Ambulatory Visit: Admitting: Cardiology

## 2024-04-05 ENCOUNTER — Ambulatory Visit: Admitting: Family Medicine

## 2024-05-03 ENCOUNTER — Ambulatory Visit: Admitting: Internal Medicine
